# Patient Record
Sex: Female | Born: 1980 | Race: Black or African American | Hispanic: No | Marital: Married
Health system: Southern US, Community
[De-identification: ages and names within clinical notes are randomized; demographics above are authoritative.]

## PROBLEM LIST (undated history)

## (undated) DIAGNOSIS — I10 Essential (primary) hypertension: Secondary | ICD-10-CM

## (undated) DIAGNOSIS — F1010 Alcohol abuse, uncomplicated: Secondary | ICD-10-CM

## (undated) DIAGNOSIS — F419 Anxiety disorder, unspecified: Secondary | ICD-10-CM

## (undated) DIAGNOSIS — K852 Alcohol induced acute pancreatitis without necrosis or infection: Secondary | ICD-10-CM

## (undated) DIAGNOSIS — K769 Liver disease, unspecified: Secondary | ICD-10-CM

## (undated) DIAGNOSIS — F10931 Alcohol use, unspecified with withdrawal delirium: Secondary | ICD-10-CM

## (undated) DIAGNOSIS — G9340 Encephalopathy, unspecified: Secondary | ICD-10-CM

## (undated) HISTORY — DX: Anxiety disorder, unspecified: F41.9

## (undated) HISTORY — DX: Essential (primary) hypertension: I10

## (undated) HISTORY — PX: ECTOPIC PREGNANCY SURGERY: SHX613

---

## 2002-08-07 ENCOUNTER — Inpatient Hospital Stay (HOSPITAL_COMMUNITY): Admission: AD | Admit: 2002-08-07 | Discharge: 2002-08-07 | Payer: Self-pay | Admitting: Obstetrics and Gynecology

## 2002-09-21 ENCOUNTER — Inpatient Hospital Stay (HOSPITAL_COMMUNITY): Admission: AD | Admit: 2002-09-21 | Discharge: 2002-09-21 | Payer: Self-pay | Admitting: *Deleted

## 2002-10-16 ENCOUNTER — Inpatient Hospital Stay (HOSPITAL_COMMUNITY): Admission: AD | Admit: 2002-10-16 | Discharge: 2002-10-16 | Payer: Self-pay | Admitting: Family Medicine

## 2002-10-22 ENCOUNTER — Inpatient Hospital Stay (HOSPITAL_COMMUNITY): Admission: AD | Admit: 2002-10-22 | Discharge: 2002-10-22 | Payer: Self-pay | Admitting: *Deleted

## 2002-11-07 ENCOUNTER — Inpatient Hospital Stay (HOSPITAL_COMMUNITY): Admission: AD | Admit: 2002-11-07 | Discharge: 2002-11-07 | Payer: Self-pay | Admitting: *Deleted

## 2002-11-30 ENCOUNTER — Observation Stay (HOSPITAL_COMMUNITY): Admission: AD | Admit: 2002-11-30 | Discharge: 2002-12-01 | Payer: Self-pay | Admitting: Obstetrics and Gynecology

## 2003-01-25 ENCOUNTER — Ambulatory Visit (HOSPITAL_COMMUNITY): Admission: RE | Admit: 2003-01-25 | Discharge: 2003-01-25 | Payer: Self-pay | Admitting: *Deleted

## 2003-01-28 ENCOUNTER — Inpatient Hospital Stay (HOSPITAL_COMMUNITY): Admission: AD | Admit: 2003-01-28 | Discharge: 2003-01-28 | Payer: Self-pay | Admitting: *Deleted

## 2017-04-29 DIAGNOSIS — R748 Abnormal levels of other serum enzymes: Secondary | ICD-10-CM | POA: Insufficient documentation

## 2017-09-13 ENCOUNTER — Encounter (HOSPITAL_COMMUNITY): Payer: Self-pay

## 2017-09-13 ENCOUNTER — Emergency Department (HOSPITAL_COMMUNITY)
Admission: EM | Admit: 2017-09-13 | Discharge: 2017-09-15 | Disposition: A | Discharge status: Home / Self Care | Payer: Self-pay | Attending: Emergency Medicine | Admitting: Emergency Medicine

## 2017-09-13 DIAGNOSIS — F10929 Alcohol use, unspecified with intoxication, unspecified: Secondary | ICD-10-CM

## 2017-09-13 DIAGNOSIS — F10129 Alcohol abuse with intoxication, unspecified: Principal | ICD-10-CM

## 2017-09-13 DIAGNOSIS — Y908 Blood alcohol level of 240 mg/100 ml or more: Secondary | ICD-10-CM

## 2017-09-13 DIAGNOSIS — F1994 Other psychoactive substance use, unspecified with psychoactive substance-induced mood disorder: Secondary | ICD-10-CM

## 2017-09-13 DIAGNOSIS — R45851 Suicidal ideations: Secondary | ICD-10-CM

## 2017-09-13 DIAGNOSIS — F1014 Alcohol abuse with alcohol-induced mood disorder: Secondary | ICD-10-CM

## 2017-09-13 LAB — COMPREHENSIVE METABOLIC PANEL
ALBUMIN: 3.6 g/dL (ref 3.5–5.0)
ALK PHOS: 148 U/L — AB (ref 38–126)
ALT: 75 U/L — ABNORMAL HIGH (ref 14–54)
AST: 453 U/L — AB (ref 15–41)
Anion gap: 10 (ref 5–15)
BILIRUBIN TOTAL: 0.6 mg/dL (ref 0.3–1.2)
BUN: 10 mg/dL (ref 6–20)
CALCIUM: 8.1 mg/dL — AB (ref 8.9–10.3)
CO2: 23 mmol/L (ref 22–32)
Chloride: 105 mmol/L (ref 101–111)
Creatinine, Ser: 0.51 mg/dL (ref 0.44–1.00)
GFR calc Af Amer: >60 mL/min (ref >60)
GLUCOSE: 113 mg/dL — AB (ref 65–99)
POTASSIUM: 3 mmol/L — AB (ref 3.5–5.1)
Sodium: 138 mmol/L (ref 135–145)
TOTAL PROTEIN: 7.4 g/dL (ref 6.5–8.1)

## 2017-09-13 LAB — CBC
HEMATOCRIT: 32.8 % — AB (ref 36.0–46.0)
Hemoglobin: 11 g/dL — ABNORMAL LOW (ref 12.0–15.0)
MCH: 33.5 pg (ref 26.0–34.0)
MCHC: 33.5 g/dL (ref 30.0–36.0)
MCV: 100 fL (ref 78.0–100.0)
PLATELETS: 170 10*3/uL (ref 150–400)
RBC: 3.28 MIL/uL — ABNORMAL LOW (ref 3.87–5.11)
RDW: 15.7 % — AB (ref 11.5–15.5)
WBC: 3 10*3/uL — AB (ref 4.0–10.5)

## 2017-09-13 LAB — ACETAMINOPHEN LEVEL: Acetaminophen (Tylenol), Serum: <10 ug/mL — ABNORMAL LOW (ref 10–30)

## 2017-09-13 LAB — RAPID URINE DRUG SCREEN, HOSP PERFORMED
Amphetamines: NOT DETECTED
BARBITURATES: NOT DETECTED
BENZODIAZEPINES: POSITIVE — AB
Cocaine: NOT DETECTED
Opiates: NOT DETECTED
Tetrahydrocannabinol: NOT DETECTED

## 2017-09-13 LAB — ETHANOL: ALCOHOL ETHYL (B): 508 mg/dL — AB (ref <10)

## 2017-09-13 LAB — CK: Total CK: 262 U/L — ABNORMAL HIGH (ref 38–234)

## 2017-09-13 LAB — I-STAT CG4 LACTIC ACID, ED: LACTIC ACID, VENOUS: 2.38 mmol/L — AB (ref 0.5–1.9)

## 2017-09-13 LAB — SALICYLATE LEVEL: Salicylate Lvl: <7 mg/dL (ref 2.8–30.0)

## 2017-09-13 LAB — I-STAT BETA HCG BLOOD, ED (MC, WL, AP ONLY)

## 2017-09-13 MED ORDER — SODIUM CHLORIDE 0.9 % IV BOLUS (SEPSIS)
500.0000 mL | Freq: Once | INTRAVENOUS | Status: AC
  Administered 2017-09-13: 500 mL via INTRAVENOUS

## 2017-09-13 MED ORDER — SODIUM CHLORIDE 0.9 % IV BOLUS (SEPSIS)
1000.0000 mL | Freq: Once | INTRAVENOUS | Status: AC
  Administered 2017-09-13: 1000 mL via INTRAVENOUS

## 2017-09-13 NOTE — ED Triage Notes (Signed)
Pt arrived from home via [facility] with reports of aggressive behaviors and alcohol intoxication. Pt was given 82m versed en route with no change. Pt has arrived screaming, cursing and attempting to bite EMS.

## 2017-09-13 NOTE — ED Notes (Signed)
Bed: IO03 Expected date:  Expected time:  Means of arrival:  Comments: EMS 37 yo female alcohol abuse combative versed 5 mg/restrained

## 2017-09-14 DIAGNOSIS — F1014 Alcohol abuse with alcohol-induced mood disorder: Secondary | ICD-10-CM

## 2017-09-14 LAB — I-STAT CG4 LACTIC ACID, ED: Lactic Acid, Venous: 2.06 mmol/L (ref 0.5–1.9)

## 2017-09-14 MED ORDER — POTASSIUM CHLORIDE 10 MEQ/100ML IV SOLN
10.0000 meq | Freq: Once | INTRAVENOUS | Status: AC
  Administered 2017-09-14: 10 meq via INTRAVENOUS
  Filled 2017-09-14: qty 100

## 2017-09-14 MED ORDER — TRAZODONE HCL 50 MG PO TABS
50.0000 mg | ORAL_TABLET | Freq: Every evening | ORAL | Status: DC | PRN
  Administered 2017-09-14: 50 mg via ORAL
  Filled 2017-09-14: qty 1

## 2017-09-14 MED ORDER — ACETAMINOPHEN 325 MG PO TABS: 650.0000 mg | ORAL_TABLET | ORAL | Status: DC | PRN

## 2017-09-14 MED ORDER — GABAPENTIN 300 MG PO CAPS
300.0000 mg | ORAL_CAPSULE | Freq: Three times a day (TID) | ORAL | Status: DC
  Administered 2017-09-14 (×3): 300 mg via ORAL
  Filled 2017-09-14 (×3): qty 1

## 2017-09-14 MED ORDER — ONDANSETRON HCL 4 MG PO TABS: 4.0000 mg | ORAL_TABLET | Freq: Three times a day (TID) | ORAL | Status: DC | PRN

## 2017-09-14 MED ORDER — ALUM & MAG HYDROXIDE-SIMETH 200-200-20 MG/5ML PO SUSP: 30.0000 mL | Freq: Four times a day (QID) | ORAL | Status: DC | PRN

## 2017-09-14 MED ORDER — POTASSIUM CHLORIDE CRYS ER 20 MEQ PO TBCR: 40.0000 meq | EXTENDED_RELEASE_TABLET | Freq: Once | ORAL | Status: DC

## 2017-09-14 NOTE — ED Notes (Signed)
Pt has started to become more awake, started yelling and cursing about why she is in the hospital. When trying to explain the patient became irate and saying "that's not true to get her husband on the f*ing phone right now."

## 2017-09-14 NOTE — ED Notes (Signed)
Hourly rounding reveals patient sleeping in room. No complaints, stable, in no acute distress. Q15 minute rounds and monitoring via Security Cameras to continue. 

## 2017-09-14 NOTE — Patient Outreach (Signed)
CPSS met with patient and her husband. The husband of the patient informed CPSS that he was taking her for an assessment at [facility] for substance use treatment tomorrow morning. CPSS provided resource list for patient in case she does not get into [facility]. CPSS also provided CPSS contact information. CPSS encouraged husband and patient to contact CPSS at any time for help with substance use resources.

## 2017-09-14 NOTE — ED Notes (Signed)
Pt is pleasant.  She states she intends to go to [facility] in the morning.  Pt has much family support.  Pt is showing symptoms of shakes.  She has been medicated as per order.

## 2017-09-14 NOTE — ED Notes (Signed)
Writer notified EDP Roxanne Mins of abnormal I stat lactic result

## 2017-09-14 NOTE — ED Notes (Signed)
Report to include Situation, Background, Assessment, and Recommendations received from [facility]. Patient alert and oriented, warm and dry, in no acute distress. Patient denies SI, HI, AVH and pain. Patient made aware of Q15 minute rounds and security cameras for their safety. Patient instructed to come to me with needs or concerns.

## 2017-09-14 NOTE — ED Notes (Signed)
Hourly rounding reveals patient in room. Complains of insomnia, stable, in no acute distress. Q15 minute rounds and monitoring via [employer] to continue.

## 2017-09-14 NOTE — ED Provider Notes (Signed)
[facility] DEPT Provider Note   CSN: [id number] Arrival date & time: 09/13/17  2028     History   Chief Complaint Chief Complaint  Patient presents with  . Aggressive Behavior  . Alcohol Intoxication    HPI Joann [name] is a 37 y.o. female.  37 year old female with unknown past medical history presents with alcohol intoxication and aggressive behavior.  EMS was called to her home by her husband who reported aggressive behavior and stated that she had been drinking tonight.  EMS found her surrounded by liquor bottles and agitated.  She received 21m IM versed due to agitation in transport. Blood glucose normal. She has threatened to hurt others stating "I will kill you bitch" and she has also threatened to kill herself. She asked police officers repeatedly to kill her.   LEVEL 5 CAVEAT DUE TO AMS   The history is provided by the EMS personnel.    History reviewed. No pertinent past medical history.  There are no active problems to display for this patient.     OB History    No data available       Home Medications    Prior to Admission medications   Not on File    Family History No family history on file.  Unable to obtain 2/2 AMS  Social History Social History   Tobacco Use  . Smoking status: Not on file  Substance Use Topics  . Alcohol use: Not on file  . Drug use: Not on file   + alcohol use Unable to obtain 2/2 AMS  Allergies   Patient has no allergy information on record. Unable to obtain 2/2 AMS  Review of Systems Review of Systems Unable to obtain 2/2 AMS  Physical Exam Updated Vital Signs LMP 09/13/2017   Physical Exam  Constitutional: She appears well-developed and well-nourished. She appears distressed.  Yelling, combative  HENT:  Head: Normocephalic and atraumatic.  Eyes: Conjunctivae are normal. Pupils are equal, round, and reactive to light.  Neck: Neck supple.  Cardiovascular: Regular rhythm and  normal heart sounds. Tachycardia present.  No murmur heard. Pulmonary/Chest: Effort normal and breath sounds normal.  Abdominal: Soft. Bowel sounds are normal. She exhibits no distension. There is no tenderness.  Musculoskeletal: She exhibits no edema.  Neurological: She is alert.  Slurred speech, moving all 4 ext equally  Skin: Skin is warm. She is diaphoretic.  Psychiatric: Her affect is angry and labile. Her speech is slurred. She is agitated, aggressive and combative.  Nursing note and vitals reviewed.    ED Treatments / Results  Labs (all labs ordered are listed, but only abnormal results are displayed) Labs Reviewed  COMPREHENSIVE METABOLIC PANEL - Abnormal; Notable for the following components:      Result Value   Potassium 3.0 (*)    Glucose, Bld 113 (*)    Calcium 8.1 (*)    AST 453 (*)    ALT 75 (*)    Alkaline Phosphatase 148 (*)    All other components within normal limits  ETHANOL - Abnormal; Notable for the following components:   Alcohol, Ethyl (B) 508 (*)    All other components within normal limits  ACETAMINOPHEN LEVEL - Abnormal; Notable for the following components:   Acetaminophen (Tylenol), Serum <10 (*)    All other components within normal limits  CBC - Abnormal; Notable for the following components:   WBC 3.0 (*)    RBC 3.28 (*)    Hemoglobin 11.0 (*)  HCT 32.8 (*)    RDW 15.7 (*)    All other components within normal limits  RAPID URINE DRUG SCREEN, HOSP PERFORMED - Abnormal; Notable for the following components:   Benzodiazepines POSITIVE (*)    All other components within normal limits  CK - Abnormal; Notable for the following components:   Total CK 262 (*)    All other components within normal limits  I-STAT CG4 LACTIC ACID, ED - Abnormal; Notable for the following components:   Lactic Acid, Venous 2.38 (*)    All other components within normal limits  SALICYLATE LEVEL  I-STAT BETA HCG BLOOD, ED ([facility], [facility], [facility] ONLY)  I-STAT CG4 LACTIC ACID,  ED    EKG  EKG Interpretation None       Radiology No results found.  Procedures Procedures (including critical care time)  Medications Ordered in ED Medications  potassium chloride 10 mEq in 100 mL IVPB (not administered)  potassium chloride SA (K-DUR,KLOR-CON) CR tablet 40 mEq (not administered)  sodium chloride 0.9 % bolus 1,000 mL (1,000 mLs Intravenous New Bag/Given 09/13/17 2228)  sodium chloride 0.9 % bolus 500 mL (500 mLs Intravenous New Bag/Given 09/13/17 2228)     Initial Impression / Assessment and Plan / ED Course  I have reviewed the triage vital signs and the nursing notes.  Pertinent labs that were available during my care of the patient were reviewed by me and considered in my medical decision making (see chart for details).    PT combative, agitated, requiring police restraint on arrival.  Immediately gave IM Haldol and placed in soft restraints due to concern for staff and patient's safety.  Completed IVC papers because of her threats to hurt others as well as her threats to kill herself.  Lactate 2.38, potassium 3, blood alcohol 508, CK 262.  Gave IV fluids and potassium repletion.  We will allow the patient to metabolize and when she is more sober we will contact TTS for evaluation.  I am signing out to the oncoming provider Dr. Roxanne Mins. Dispo pending sobriety and reassessment.  Final Clinical Impressions(s) / ED Diagnoses   Final diagnoses:  None    ED Discharge Orders    None       Camella Seim, Wenda Overland, MD 09/14/17 [phone]

## 2017-09-14 NOTE — ED Notes (Signed)
Hourly rounding reveals patient in room. No complaints, stable, in no acute distress. Q15 minute rounds and monitoring via Security Cam[employer] to continue. 

## 2017-09-14 NOTE — ED Notes (Signed)
Pt sleeping.  Easily arousable .  Pt denies S/I and H/I.  Pt uninterested in assessment.  15 minute checks and video monitoring in place.  Pt shows no signs of withdrawal.

## 2017-09-14 NOTE — Progress Notes (Signed)
CSW faxed IVC paperwork to the [facility]. CSW called [employer]. Magistrate confirmed that they received the fax.   Wendelyn Breslow, [facility]  [phone]

## 2017-09-14 NOTE — ED Provider Notes (Signed)
Patient signed out to me with alcohol intoxication, waiting for level to come down to point where she can participate in TTS evaluation.  She has been resting comfortably throughout the night and shows no tremulousness.   Delora Fuel, MD 19/94/12 [phone]

## 2017-09-14 NOTE — Progress Notes (Signed)
Patient ID: Joann [name], female   DOB: [birth year], 37 y.o.   MRN: [id number]  Per Staff RN; Pt's husband informed that Pt has a treatment bed at [facility] but needs to leave early in the AM, at least by 0930-1000 in order to secure the bed.    Ethelene Hal, FNP-BC 09-14-2017       [phone]
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
## 2017-09-15 DIAGNOSIS — F1014 Alcohol abuse with alcohol-induced mood disorder: Secondary | ICD-10-CM

## 2017-09-15 DIAGNOSIS — R45850 Homicidal ideations: Secondary | ICD-10-CM

## 2017-09-15 DIAGNOSIS — Y908 Blood alcohol level of 240 mg/100 ml or more: Secondary | ICD-10-CM

## 2017-09-15 NOTE — ED Notes (Signed)
Patient is A & O.  Understood AVS. Discharged with husband and going to [employer] [facility]

## 2017-09-15 NOTE — ED Notes (Signed)
Hourly rounding reveals patient sleeping in room. No complaints, stable, in no acute distress. Q15 minute rounds and monitoring via Security Cameras to continue. 

## 2017-09-15 NOTE — BHH Suicide Risk Assessment (Signed)
Suicide Risk Assessment  Discharge Assessment   [facility] Discharge Suicide Risk Assessment   Principal Problem: Alcohol abuse with alcohol-induced mood disorder [facility]) Discharge Diagnoses:  Patient Active Problem List   Diagnosis Date Noted  . Alcohol abuse with alcohol-induced mood disorder ([place]) [F10.14] 09/14/2017    Total Time spent with patient: 45 minutes  Musculoskeletal: Strength & Muscle Tone: within normal limits Gait & Station: normal Patient leans: N/A  Psychiatric Specialty Exam: Physical Exam  Constitutional: She is oriented to person, place, and time. She appears well-developed and well-nourished.  HENT:  Head: Normocephalic.  Respiratory: Effort normal.  Musculoskeletal: Normal range of motion.  Neurological: She is alert and oriented to person, place, and time.  Psychiatric: Her speech is normal and behavior is normal. Thought content normal. Her mood appears anxious. Cognition and memory are normal. She expresses impulsivity.   Review of Systems  Psychiatric/Behavioral: Positive for substance abuse. Negative for depression, hallucinations, memory loss and suicidal ideas. The patient is not nervous/anxious and does not have insomnia.   All other systems reviewed and are negative.  Blood pressure 139/86, pulse 89, temperature 98.4 F (36.9 C), temperature source Oral, resp. rate 18, last menstrual period 09/13/2017, SpO2 100 %.There is no height or weight on file to calculate BMI. General Appearance: Casual Eye Contact:  Good Speech:  Clear and Coherent and Normal Rate Volume:  Normal Mood:  Depressed Affect:  Congruent Thought Process:  Coherent, Goal Directed and Linear Orientation:  Full (Time, Place, and Person) Thought Content:  Logical Suicidal Thoughts:  No Homicidal Thoughts:  No Memory:  Immediate;   Good Recent;   Good Remote;   Fair Judgement:  Fair Insight:  Fair Psychomotor Activity:  Normal Concentration:  Concentration: Good and Attention  Span: Good Recall:  Good Fund of Knowledge:  Good Language:  Good Akathisia:  No Handed:  Right AIMS (if indicated):    Assets:  Communication Skills Desire for Improvement Housing Social Support ADL's:  Intact Cognition:  WNL   Mental Status Per Nursing Assessment::   On Admission:   Homicidal ideation while intoxicated  Demographic Factors:  Low socioeconomic status and Unemployed  Loss Factors: Financial problems/change in socioeconomic status  Historical Factors: Impulsivity  Risk Reduction Factors:   Sense of responsibility to family and Living with another person, especially a relative  Continued Clinical Symptoms:  Depression:   Impulsivity Alcohol/Substance Abuse/Dependencies  Cognitive Features That Contribute To Risk:  Closed-mindedness    Suicide Risk:  Minimal: No identifiable suicidal ideation.  Patients presenting with no risk factors but with morbid ruminations; may be classified as minimal risk based on the severity of the depressive symptoms    Plan Of Care/Follow-up recommendations:  Activity:  as tolerated Diet:  Heart Healthy  Ethelene Hal, NP 09/15/2017, 1:11 PM

## 2017-09-15 NOTE — Progress Notes (Signed)
Per tx team, pt is being D/C'd.  Psychiatrist signed papers rescinding pt's IVC paperwork, CSW faxed in to the magistrate and confirmed paperwork was received.  Please reconsult if future social work needs arise.  CSW signing off, as social work intervention is no longer needed.  Alphonse Guild. Alexsa Flaum, LCSW, LCAS, CSI Clinical Social Worker Ph: [phone]

## 2017-09-15 NOTE — Consult Note (Signed)
[facility] Consult   Reason for Consult:  Homicidal ideation while intoxicated Referring Physician:  EDP Patient Identification: Joann [name] MRN:  [id number] Principal Diagnosis: Alcohol abuse with alcohol-induced mood disorder [facility]) Diagnosis:   Patient Active Problem List   Diagnosis Date Noted  . Alcohol abuse with alcohol-induced mood disorder ([place]) [F10.14] 09/14/2017    Total Time spent with patient: 45 minutes  Subjective:   Joann [name] is a 37 y.o. female patient admitted with homicidal ideation while intoxicated.  HPI:  Pt was seen and chart reviewed with treatment team and Dr Darleene Cleaver. Pt stated she knows she needs help for her drinking problem and is ready to participate. Pt has a bed at [facility] and can be admitted there today. Pt's UDS negative, BAL 508 on admission. Pt was seen by Peer Support and has substance abuse treatment resources in the community.  Pt denies suicidal/homicidal ideation, denies auditory/visual hallucinations and does not appear to be responding to internal stimuli. Pt is stable and psychiatrically clear for discharge.   Past Psychiatric History: As above  Risk to Self: None Risk to Others: None Prior Inpatient Therapy: Prior Inpatient Therapy: (UTA) Prior Therapy Dates: UTA Prior Therapy Facilty/Provider(s): UTA Reason for Treatment: UTA Prior Outpatient Therapy: Prior Outpatient Therapy: (UTA) Prior Therapy Dates: UTA Prior Therapy Facilty/Provider(s): UTA Reason for Treatment: UTA Does patient have an ACCT team?: Unknown Does patient have Intensive In-House Services?  : Unknown Does patient have [facility] services? : Unknown Does patient have P4CC services?: Unknown  Past Medical History: History reviewed. No pertinent past medical history. Family History: No family history on file. Family Psychiatric  History: Unknown Social History:  Social History   Substance and Sexual Activity  Alcohol Use Not on file     Social  History   Substance and Sexual Activity  Drug Use Not on file    Social History   Socioeconomic History  . Marital status: Married    Spouse name: None  . Number of children: None  . Years of education: None  . Highest education level: None  Social Needs  . Financial resource strain: None  . Food insecurity - worry: None  . Food insecurity - inability: None  . Transportation needs - medical: None  . Transportation needs - non-medical: None  Occupational History  . None  Tobacco Use  . Smoking status: None  Substance and Sexual Activity  . Alcohol use: None  . Drug use: None  . Sexual activity: None  Other Topics Concern  . None  Social History Narrative  . None   Additional Social History:    Allergies:  No Known Allergies  Labs:  Results for orders placed or performed during the hospital encounter of 09/13/17 (from the past 48 hour(s))  Rapid urine drug screen (hospital performed)     Status: Abnormal   Collection Time: 09/13/17  8:55 PM  Result Value Ref Range   Opiates NONE DETECTED NONE DETECTED   Cocaine NONE DETECTED NONE DETECTED   Benzodiazepines POSITIVE (A) NONE DETECTED   Amphetamines NONE DETECTED NONE DETECTED   Tetrahydrocannabinol NONE DETECTED NONE DETECTED   Barbiturates NONE DETECTED NONE DETECTED    Comment: (NOTE) DRUG SCREEN FOR MEDICAL PURPOSES ONLY.  IF CONFIRMATION IS NEEDED FOR ANY PURPOSE, NOTIFY LAB WITHIN 5 DAYS. LOWEST DETECTABLE LIMITS FOR URINE DRUG SCREEN Drug Class                     Cutoff (ng/mL) Amphetamine and  metabolites    1000 Barbiturate and metabolites    200 Benzodiazepine                 160 Tricyclics and metabolites     300 Opiates and metabolites        300 Cocaine and metabolites        300 THC                            50   Comprehensive metabolic panel     Status: Abnormal   Collection Time: 09/13/17  8:57 PM  Result Value Ref Range   Sodium 138 135 - 145 mmol/L   Potassium 3.0 (L) 3.5 - 5.1 mmol/L    Chloride 105 101 - 111 mmol/L   CO2 23 22 - 32 mmol/L   Glucose, Bld 113 (H) 65 - 99 mg/dL   BUN 10 6 - 20 mg/dL   Creatinine, Ser 0.51 0.44 - 1.00 mg/dL   Calcium 8.1 (L) 8.9 - 10.3 mg/dL   Total Protein 7.4 6.5 - 8.1 g/dL   Albumin 3.6 3.5 - 5.0 g/dL   AST 453 (H) 15 - 41 U/L   ALT 75 (H) 14 - 54 U/L   Alkaline Phosphatase 148 (H) 38 - 126 U/L   Total Bilirubin 0.6 0.3 - 1.2 mg/dL   GFR calc non Af Amer >60 >60 mL/min   GFR calc Af Amer >60 >60 mL/min    Comment: (NOTE) The eGFR has been calculated using the CKD EPI equation. This calculation has not been validated in all clinical situations. eGFR's persistently <60 mL/min signify possible Chronic Kidney Disease.    Anion gap 10 5 - 15  Ethanol     Status: Abnormal   Collection Time: 09/13/17  8:57 PM  Result Value Ref Range   Alcohol, Ethyl (B) 508 (HH) <10 mg/dL    Comment:        LOWEST DETECTABLE LIMIT FOR SERUM ALCOHOL IS 10 mg/dL FOR MEDICAL PURPOSES ONLY CRITICAL RESULT CALLED TO, READ BACK BY AND VERIFIED WITH: TEUP,N. RN @2146  ON 10.93.23 BY COHEN,K   Salicylate level     Status: None   Collection Time: 09/13/17  8:57 PM  Result Value Ref Range   Salicylate Lvl <5.5 2.8 - 30.0 mg/dL  Acetaminophen level     Status: Abnormal   Collection Time: 09/13/17  8:57 PM  Result Value Ref Range   Acetaminophen (Tylenol), Serum <10 (L) 10 - 30 ug/mL    Comment:        THERAPEUTIC CONCENTRATIONS VARY SIGNIFICANTLY. A RANGE OF 10-30 ug/mL MAY BE AN EFFECTIVE CONCENTRATION FOR MANY PATIENTS. HOWEVER, SOME ARE BEST TREATED AT CONCENTRATIONS OUTSIDE THIS RANGE. ACETAMINOPHEN CONCENTRATIONS >150 ug/mL AT 4 HOURS AFTER INGESTION AND >50 ug/mL AT 12 HOURS AFTER INGESTION ARE OFTEN ASSOCIATED WITH TOXIC REACTIONS.   cbc     Status: Abnormal   Collection Time: 09/13/17  8:57 PM  Result Value Ref Range   WBC 3.0 (L) 4.0 - 10.5 K/uL   RBC 3.28 (L) 3.87 - 5.11 MIL/uL   Hemoglobin 11.0 (L) 12.0 - 15.0 g/dL   HCT 32.8  (L) 36.0 - 46.0 %   MCV 100.0 78.0 - 100.0 fL   MCH 33.5 26.0 - 34.0 pg   MCHC 33.5 30.0 - 36.0 g/dL   RDW 15.7 (H) 11.5 - 15.5 %   Platelets 170 150 - 400 K/uL  CK  Status: Abnormal   Collection Time: 09/13/17  8:57 PM  Result Value Ref Range   Total CK 262 (H) 38 - 234 U/L  I-Stat beta hCG blood, ED     Status: None   Collection Time: 09/13/17  9:08 PM  Result Value Ref Range   I-stat hCG, quantitative <5.0 <5 mIU/mL   Comment 3            Comment:   GEST. AGE      CONC.  (mIU/mL)   <=1 WEEK        5 - 50     2 WEEKS       50 - 500     3 WEEKS       100 - 10,000     4 WEEKS     1,000 - 30,000        FEMALE AND NON-PREGNANT FEMALE:     LESS THAN 5 mIU/mL   I-Stat CG4 Lactic Acid, ED     Status: Abnormal   Collection Time: 09/13/17  9:09 PM  Result Value Ref Range   Lactic Acid, Venous 2.38 (HH) 0.5 - 1.9 mmol/L   Comment NOTIFIED PHYSICIAN   I-Stat CG4 Lactic Acid, ED     Status: Abnormal   Collection Time: 09/14/17  1:24 AM  Result Value Ref Range   Lactic Acid, Venous 2.06 (HH) 0.5 - 1.9 mmol/L   Comment NOTIFIED PHYSICIAN     No current facility-administered medications for this encounter.    No current outpatient medications on file.    Musculoskeletal: Strength & Muscle Tone: within normal limits Gait & Station: normal Patient leans: N/A  Psychiatric Specialty Exam: Physical Exam  Constitutional: She is oriented to person, place, and time. She appears well-developed and well-nourished.  HENT:  Head: Normocephalic.  Respiratory: Effort normal.  Musculoskeletal: Normal range of motion.  Neurological: She is alert and oriented to person, place, and time.  Psychiatric: Her speech is normal and behavior is normal. Thought content normal. Her mood appears anxious. Cognition and memory are normal. She expresses impulsivity.    Review of Systems  Psychiatric/Behavioral: Positive for substance abuse. Negative for depression, hallucinations, memory loss and  suicidal ideas. The patient is not nervous/anxious and does not have insomnia.   All other systems reviewed and are negative.   Blood pressure 139/86, pulse 89, temperature 98.4 F (36.9 C), temperature source Oral, resp. rate 18, last menstrual period 09/13/2017, SpO2 100 %.There is no height or weight on file to calculate BMI.  General Appearance: Casual  Eye Contact:  Good  Speech:  Clear and Coherent and Normal Rate  Volume:  Normal  Mood:  Depressed  Affect:  Congruent  Thought Process:  Coherent, Goal Directed and Linear  Orientation:  Full (Time, Place, and Person)  Thought Content:  Logical  Suicidal Thoughts:  No  Homicidal Thoughts:  No  Memory:  Immediate;   Good Recent;   Good Remote;   Fair  Judgement:  Fair  Insight:  Fair  Psychomotor Activity:  Normal  Concentration:  Concentration: Good and Attention Span: Good  Recall:  Good  Fund of Knowledge:  Good  Language:  Good  Akathisia:  No  Handed:  Right  AIMS (if indicated):     Assets:  Communication Skills Desire for Improvement Housing Social Support  ADL's:  Intact  Cognition:  WNL  Sleep:        Treatment Plan Summary: Plan Alcohol abuse with alcohol-induced mood disorder ([place])  Discharge Home, Husband to transport to [facility] in [place] all medications as prescribed Avoid the use of alcohol and illicit drugs  Disposition: No evidence of imminent risk to self or others at present.   Patient does not meet criteria for psychiatric inpatient admission. Supportive therapy provided about ongoing stressors. Discussed crisis plan, support from social network, calling 911, coming to the Emergency Department, and calling Suicide Hotline.  Ethelene Hal, NP 09/15/2017 12:58 PM  Patient seen face-to-face for psychiatric evaluation, chart reviewed and case discussed with the physician extender and developed treatment plan. Reviewed the information documented and agree with the treatment  plan. Corena Pilgrim, MD

## 2017-09-15 NOTE — ED Notes (Signed)
Patient is A & O and is waiting for discharge.  Husband at bedside.

## 2017-10-04 ENCOUNTER — Emergency Department (HOSPITAL_COMMUNITY): Admission: EM | Admit: 2017-10-04 | Discharge: 2017-10-04 | Discharge status: Absent without leave | Payer: Self-pay

## 2018-02-08 ENCOUNTER — Other Ambulatory Visit: Payer: Self-pay

## 2018-02-08 ENCOUNTER — Encounter (HOSPITAL_COMMUNITY): Payer: Self-pay | Admitting: Emergency Medicine

## 2018-02-08 ENCOUNTER — Emergency Department (HOSPITAL_COMMUNITY)
Admission: EM | Admit: 2018-02-08 | Discharge: 2018-02-09 | Disposition: A | Discharge status: Home / Self Care | Payer: Self-pay | Attending: Emergency Medicine | Admitting: Emergency Medicine

## 2018-02-08 DIAGNOSIS — Z5321 Procedure and treatment not carried out due to patient leaving prior to being seen by health care provider: Secondary | ICD-10-CM

## 2018-02-08 DIAGNOSIS — R079 Chest pain, unspecified: Principal | ICD-10-CM

## 2018-02-08 LAB — BASIC METABOLIC PANEL
Anion gap: 13 (ref 5–15)
BUN: 14 mg/dL (ref 6–20)
CHLORIDE: 105 mmol/L (ref 101–111)
CO2: 22 mmol/L (ref 22–32)
CREATININE: 0.6 mg/dL (ref 0.44–1.00)
Calcium: 8.7 mg/dL — ABNORMAL LOW (ref 8.9–10.3)
GFR calc non Af Amer: >60 mL/min (ref >60)
Glucose, Bld: 109 mg/dL — ABNORMAL HIGH (ref 65–99)
Potassium: 3.6 mmol/L (ref 3.5–5.1)
Sodium: 140 mmol/L (ref 135–145)

## 2018-02-08 LAB — CBC
HEMATOCRIT: 34 % — AB (ref 36.0–46.0)
Hemoglobin: 11 g/dL — ABNORMAL LOW (ref 12.0–15.0)
MCH: 30.4 pg (ref 26.0–34.0)
MCHC: 32.4 g/dL (ref 30.0–36.0)
MCV: 93.9 fL (ref 78.0–100.0)
PLATELETS: 144 10*3/uL — AB (ref 150–400)
RBC: 3.62 MIL/uL — AB (ref 3.87–5.11)
RDW: 16 % — ABNORMAL HIGH (ref 11.5–15.5)
WBC: 4.8 10*3/uL (ref 4.0–10.5)

## 2018-02-08 LAB — I-STAT BETA HCG BLOOD, ED (MC, WL, AP ONLY): I-stat hCG, quantitative: <5 m[IU]/mL (ref <5)

## 2018-02-08 LAB — I-STAT TROPONIN, ED: Troponin i, poc: 0 ng/mL (ref 0.00–0.08)

## 2018-02-08 MED ORDER — LORAZEPAM 2 MG/ML IJ SOLN: 1.0000 mg | Freq: Once | INTRAMUSCULAR | Status: DC

## 2018-02-08 NOTE — ED Notes (Signed)
Pt and family member arguing.  Pt walks out.

## 2018-02-08 NOTE — ED Provider Notes (Deleted)
11:43 PM I was asked to come see pt. Pt initially checked in for chest pain which started at home. Pt with hx of alcohol abuse, has been trying to "wean" herself form drinking. States on ativan at home, but did have few drinks around 2 pm.  States this evening started having chest pressure in the left side of the chest which is now resolved.  Patient was triaged, had blood work drawn, and then she stated to the staff that she wanted to leave and I was asked to come and see patient.   Patient appears to be agitated.  She is yelling at her husband who is also in the room.  She is requesting to have her IV out which was just put in.  She states that her chest pain is better and she would like to go home.  On initial nursing exam, patient is suspected to be in alcohol withdrawal, her CIWA score 16.  I asked patient to stay and that we will give her some Ativan to calm her down.  However patient is adamant at this time that she is leaving.  She understands the risk of leaving with abnormal vital signs and this episode of chest pain.  Although she states that it is now gone, there is still concern about possible and potentially life-threatening event.  Patient's husband trying to talk her into staying, however patient is adamant about getting her IV out.  At this time, patient is alert and oriented, I think she is capable of making own decisions even though she is slightly agitated.  She appears to be clinically sober.  She is ambulatory.  She stated that she will come back of her symptoms are worsening.  She will be discharged AGAINST MEDICAL ADVICE  Physical exam: Aggitated. AAOx3. Lungs clear. Tachycardic otherwise normal VS. No peripheral edema.   Vitals:   02/08/18 2314  BP: 129/76  Pulse: (!) 112  Resp: 18  Temp: 98.5 F (36.9 C)  TempSrc: Oral  SpO2: 98%      Jeannett Senior, PA-C 02/08/18 2348

## 2018-02-08 NOTE — ED Triage Notes (Addendum)
Pt reports sudden onset non radiating centralized chest pain that started around 9pm.  Pt reports she is experiencing withdrawals from alcohol, she has been trying to "wean myself off."  Pt last drink was 1 beer and 2 shots around 1pm.  Pt has hx of withdrawal seizures.  Pt has been uncooperative and does not want to be here.  Family is supportive in triage.  Pt was given 324 of ASA but refused Nitro.

## 2018-02-08 NOTE — ED Notes (Signed)
Pt became very aggitated, demanded to leave.  PA bedside.  IV taken out as she attempted to pull it herself.

## 2018-02-09 LAB — RAPID URINE DRUG SCREEN, HOSP PERFORMED
Amphetamines: NOT DETECTED
BARBITURATES: NOT DETECTED
BENZODIAZEPINES: NOT DETECTED
COCAINE: NOT DETECTED
Opiates: NOT DETECTED
TETRAHYDROCANNABINOL: NOT DETECTED

## 2018-02-09 LAB — ETHANOL: Alcohol, Ethyl (B): 415 mg/dL (ref <10)

## 2018-03-21 NOTE — ED Provider Notes (Signed)
[facility] EMERGENCY DEPARTMENT Provider Note   CSN: [id number] Arrival date & time: 02/08/18  2306     History   Chief Complaint Chief Complaint  Patient presents with  . Alcohol Intoxication  . Chest Pain    HPI Joann [name] is a 37 y.o. female.  HPI Joann [name] is a 37 y.o. female who presented to emergency department complaining of chest pain that started at home.  She has history of alcohol abuse and she states she has been trying to wean herself from drinking.  States taking Ativan at home but is not helping.  Had a few drinks around 2 PM today.  She states this evening started having chest pressure on the left side of the chest that now is better.  Patient was seen in triage, had blood work done, and then decided that she wanted to leave, nose was asked to come and see patient.  Patient denies any complaints to me and states she wants to be discharged home that she has no symptoms anymore.  History reviewed. No pertinent past medical history.  Patient Active Problem List   Diagnosis Date Noted  . Alcohol abuse with alcohol-induced mood disorder ([place]) 09/14/2017    History reviewed. No pertinent surgical history.   OB History   None      Home Medications    Prior to Admission medications   Not on File    Family History No family history on file.  Social History Social History   Tobacco Use  . Smoking status: Not on file  Substance Use Topics  . Alcohol use: Not on file  . Drug use: Not on file     Allergies   Patient has no known allergies.   Review of Systems Review of Systems  Constitutional: Negative for chills and fever.  Respiratory: Positive for chest tightness. Negative for cough and shortness of breath.   Cardiovascular: Positive for chest pain. Negative for palpitations and leg swelling.  Gastrointestinal: Negative for abdominal pain, diarrhea, nausea and vomiting.  Genitourinary: Negative for dysuria, flank pain, pelvic  pain, vaginal bleeding, vaginal discharge and vaginal pain.  Musculoskeletal: Negative for arthralgias, myalgias, neck pain and neck stiffness.  Skin: Negative for rash.  Neurological: Negative for dizziness, weakness and headaches.  All other systems reviewed and are negative.    Physical Exam Updated Vital Signs BP 129/76 (BP Location: Right Arm)   Pulse (!) 112   Temp 98.5 F (36.9 C) (Oral)   Resp 18   SpO2 98%   Physical Exam  Constitutional: She appears well-developed and well-nourished.  Agitated, screaming.  Appears to be somewhat intoxicated but able to communicate and carry on a conversation.  Eyes: Conjunctivae are normal.  Neck: Neck supple.  Neurological: She is alert.  Skin: Skin is warm and dry.  Nursing note and vitals reviewed.    ED Treatments / Results  Labs (all labs ordered are listed, but only abnormal results are displayed) Labs Reviewed  BASIC METABOLIC PANEL - Abnormal; Notable for the following components:      Result Value   Glucose, Bld 109 (*)    Calcium 8.7 (*)    All other components within normal limits  CBC - Abnormal; Notable for the following components:   RBC 3.62 (*)    Hemoglobin 11.0 (*)    HCT 34.0 (*)    RDW 16.0 (*)    Platelets 144 (*)    All other components within normal limits  ETHANOL -  Abnormal; Notable for the following components:   Alcohol, Ethyl (B) 415 (*)    All other components within normal limits  RAPID URINE DRUG SCREEN, HOSP PERFORMED  I-STAT TROPONIN, ED  I-STAT BETA HCG BLOOD, ED ([facility], [facility], [facility] ONLY)  I-STAT BETA HCG BLOOD, ED ([facility], [facility], [facility] ONLY)    EKG EKG Interpretation  Date/Time:  Saturday February 08 2018 23:16:04 EDT Ventricular Rate:  106 PR Interval:  146 QRS Duration: 76 QT Interval:  326 QTC Calculation: 433 R Axis:   49 Text Interpretation:  Sinus tachycardia Low voltage QRS Nonspecific T wave abnormality Abnormal ECG No prior ECG for comparison.  No STEMI Confirmed by Antony Blackbird [phone])  on 02/09/2018 11:27:27 AM   Radiology No results found.  Procedures Procedures (including critical care time)  Medications Ordered in ED Medications - No data to display   Initial Impression / Assessment and Plan / ED Course  I have reviewed the triage vital signs and the nursing notes.  Pertinent labs & imaging results that were available during my care of the patient were reviewed by me and considered in my medical decision making (see chart for details).     Patient is in triage, I was asked to come and see her.  Initially checked in for chest pain, however now states her pain is gone and she would like to have her IV removed and would like to go home.  She does appear to be intoxicated but able to communicate and ambulatory.  When she initially was triaged, nurses were concerned about alcohol withdrawal.  They stated that her CIWA score was 16.  On my evaluation, patient just appears to be intoxicated.  I have tried to talk her into staying for further evaluation and work-up especially in setting of her chest pain.  She is mildly tachycardic.  But patient is adamant about leaving.  Family members at bedside.  Husband is trying to talk her into staying, however patient is adamant that she wants to leave.  At this time patient is alert, oriented, she is capable of making her own decisions, although slightly agitated.  She is ambulatory and walked out of the emergency department.  I explained to her her condition could be life-threatening, and she voiced understanding.    Final Clinical Impressions(s) / ED Diagnoses   Final diagnoses:  Chest pain, unspecified type    ED Discharge Orders    None      Jeannett Senior, PA-C 03/21/18 [place], [place], DO 03/21/18 1951

## 2018-05-23 ENCOUNTER — Emergency Department (HOSPITAL_COMMUNITY)
Admission: EM | Admit: 2018-05-23 | Discharge: 2018-05-23 | Disposition: A | Discharge status: Home / Self Care | Payer: Medicaid Other | Attending: Emergency Medicine | Admitting: Emergency Medicine

## 2018-05-23 ENCOUNTER — Encounter (HOSPITAL_COMMUNITY): Payer: Self-pay | Admitting: *Deleted

## 2018-05-23 ENCOUNTER — Other Ambulatory Visit: Payer: Self-pay

## 2018-05-23 DIAGNOSIS — Z5321 Procedure and treatment not carried out due to patient leaving prior to being seen by health care provider: Secondary | ICD-10-CM

## 2018-05-23 DIAGNOSIS — F10229 Alcohol dependence with intoxication, unspecified: Principal | ICD-10-CM

## 2018-05-23 HISTORY — DX: Alcohol abuse, uncomplicated: F10.10

## 2018-05-23 LAB — CBC
HEMATOCRIT: 29.3 % — AB (ref 36.0–46.0)
Hemoglobin: 9.5 g/dL — ABNORMAL LOW (ref 12.0–15.0)
MCH: 34.8 pg — AB (ref 26.0–34.0)
MCHC: 32.4 g/dL (ref 30.0–36.0)
MCV: 107.3 fL — AB (ref 78.0–100.0)
PLATELETS: 133 10*3/uL — AB (ref 150–400)
RBC: 2.73 MIL/uL — ABNORMAL LOW (ref 3.87–5.11)
RDW: 15.4 % (ref 11.5–15.5)
WBC: 3.9 10*3/uL — ABNORMAL LOW (ref 4.0–10.5)

## 2018-05-23 LAB — COMPREHENSIVE METABOLIC PANEL
ALBUMIN: 3.5 g/dL (ref 3.5–5.0)
ALK PHOS: 218 U/L — AB (ref 38–126)
ALT: 64 U/L — AB (ref 0–44)
AST: 477 U/L — AB (ref 15–41)
Anion gap: 13 (ref 5–15)
BUN: 8 mg/dL (ref 6–20)
CALCIUM: 8.4 mg/dL — AB (ref 8.9–10.3)
CHLORIDE: 102 mmol/L (ref 98–111)
CO2: 21 mmol/L — AB (ref 22–32)
CREATININE: 0.45 mg/dL (ref 0.44–1.00)
GFR calc non Af Amer: >60 mL/min (ref >60)
GLUCOSE: 141 mg/dL — AB (ref 70–99)
Potassium: 3.1 mmol/L — ABNORMAL LOW (ref 3.5–5.1)
SODIUM: 136 mmol/L (ref 135–145)
Total Bilirubin: 0.8 mg/dL (ref 0.3–1.2)
Total Protein: 7.1 g/dL (ref 6.5–8.1)

## 2018-05-23 LAB — I-STAT BETA HCG BLOOD, ED (MC, WL, AP ONLY): I-stat hCG, quantitative: <5 m[IU]/mL (ref <5)

## 2018-05-23 LAB — ETHANOL: Alcohol, Ethyl (B): 385 mg/dL (ref <10)

## 2018-05-23 NOTE — ED Notes (Signed)
Patient cursing in lobby of Emergency Room off duty officer asked pt to calm/quiten down, but pt refused and left. No respiratory or acute distress noted alert and oriented x 3.

## 2018-05-23 NOTE — ED Triage Notes (Signed)
Pt in requesting help with ETOH detox, last drink just PTA, normally drinks one gallon of vodka daily and also 2-3 8% beers, denies other substance use

## 2018-05-23 NOTE — ED Triage Notes (Signed)
Pt states she wants a refill of ativan to attempt to detox at home

## 2018-05-23 NOTE — ED Notes (Signed)
Pt approached the desk stating that she "needed to be seen now because she felt like she was going to have a seizure".  The family member that was with her stated that she was here for alcohol withdraw and was paranoid that she was [place] have a seizure.  Myself and Dellis Filbert encouraged her that she was in the right place and we would get her seen but that we could not room her because she "felt" like she was going to have a seizure.  The pt asked, "so what if I have a seizure out here?"  I encouraged her that the situation would have changed and we would act accordingly.  At the time of this interaction her ETOH lab was 385.  Aaron Edelman and Julie-RN's are aware of this interaction.

## 2018-05-24 NOTE — ED Provider Notes (Signed)
It appears the patient left the emergency room without being seen by a provider.    Joann [name], Joann Cornea, MD 05/24/18 [phone]

## 2018-08-28 ENCOUNTER — Inpatient Hospital Stay (HOSPITAL_COMMUNITY)
Admission: EM | Admit: 2018-08-28 | Discharge: 2018-09-01 | DRG: 439 | Disposition: A | Discharge status: Home / Self Care | Payer: Medicaid Other | Attending: Internal Medicine | Admitting: Internal Medicine

## 2018-08-28 ENCOUNTER — Other Ambulatory Visit: Payer: Self-pay

## 2018-08-28 ENCOUNTER — Encounter (HOSPITAL_COMMUNITY): Payer: Self-pay

## 2018-08-28 DIAGNOSIS — Y908 Blood alcohol level of 240 mg/100 ml or more: Secondary | ICD-10-CM

## 2018-08-28 DIAGNOSIS — F10920 Alcohol use, unspecified with intoxication, uncomplicated: Secondary | ICD-10-CM

## 2018-08-28 DIAGNOSIS — F17200 Nicotine dependence, unspecified, uncomplicated: Secondary | ICD-10-CM

## 2018-08-28 DIAGNOSIS — K852 Alcohol induced acute pancreatitis without necrosis or infection: Principal | ICD-10-CM

## 2018-08-28 DIAGNOSIS — R569 Unspecified convulsions: Secondary | ICD-10-CM

## 2018-08-28 DIAGNOSIS — E876 Hypokalemia: Secondary | ICD-10-CM

## 2018-08-28 DIAGNOSIS — F10129 Alcohol abuse with intoxication, unspecified: Secondary | ICD-10-CM

## 2018-08-28 DIAGNOSIS — D531 Other megaloblastic anemias, not elsewhere classified: Secondary | ICD-10-CM

## 2018-08-28 DIAGNOSIS — D62 Acute posthemorrhagic anemia: Secondary | ICD-10-CM

## 2018-08-28 DIAGNOSIS — E872 Acidosis: Secondary | ICD-10-CM

## 2018-08-28 DIAGNOSIS — E162 Hypoglycemia, unspecified: Secondary | ICD-10-CM

## 2018-08-28 DIAGNOSIS — F1010 Alcohol abuse, uncomplicated: Secondary | ICD-10-CM | POA: Diagnosis not present

## 2018-08-28 DIAGNOSIS — Z9049 Acquired absence of other specified parts of digestive tract: Secondary | ICD-10-CM | POA: Diagnosis not present

## 2018-08-28 DIAGNOSIS — Z8719 Personal history of other diseases of the digestive system: Secondary | ICD-10-CM | POA: Diagnosis not present

## 2018-08-28 DIAGNOSIS — E8729 Other acidosis: Secondary | ICD-10-CM | POA: Diagnosis present

## 2018-08-28 DIAGNOSIS — Z9889 Other specified postprocedural states: Secondary | ICD-10-CM | POA: Diagnosis not present

## 2018-08-28 DIAGNOSIS — Z7289 Other problems related to lifestyle: Secondary | ICD-10-CM | POA: Diagnosis not present

## 2018-08-28 HISTORY — DX: Other acidosis: E87.29

## 2018-08-28 LAB — CBC WITH DIFFERENTIAL/PLATELET
ABS IMMATURE GRANULOCYTES: 0.05 10*3/uL (ref 0.00–0.07)
BASOS PCT: 0 %
Basophils Absolute: 0 10*3/uL (ref 0.0–0.1)
Eosinophils Absolute: 0 10*3/uL (ref 0.0–0.5)
Eosinophils Relative: 0 %
HCT: 37.2 % (ref 36.0–46.0)
Hemoglobin: 12.3 g/dL (ref 12.0–15.0)
Immature Granulocytes: 1 %
Lymphocytes Relative: 11 %
Lymphs Abs: 1 10*3/uL (ref 0.7–4.0)
MCH: 34.3 pg — ABNORMAL HIGH (ref 26.0–34.0)
MCHC: 33.1 g/dL (ref 30.0–36.0)
MCV: 103.6 fL — AB (ref 80.0–100.0)
MONOS PCT: 14 %
Monocytes Absolute: 1.2 10*3/uL — ABNORMAL HIGH (ref 0.1–1.0)
Neutro Abs: 6.5 10*3/uL (ref 1.7–7.7)
Neutrophils Relative %: 74 %
Platelets: 138 10*3/uL — ABNORMAL LOW (ref 150–400)
RBC: 3.59 MIL/uL — ABNORMAL LOW (ref 3.87–5.11)
RDW: 14 % (ref 11.5–15.5)
WBC: 8.8 10*3/uL (ref 4.0–10.5)
nRBC: 0 % (ref 0.0–0.2)

## 2018-08-28 LAB — ETHANOL: Alcohol, Ethyl (B): 503 mg/dL (ref <10)

## 2018-08-28 LAB — I-STAT CG4 LACTIC ACID, ED: Lactic Acid, Venous: 2.55 mmol/L (ref 0.5–1.9)

## 2018-08-28 LAB — COMPREHENSIVE METABOLIC PANEL
ALT: 71 U/L — ABNORMAL HIGH (ref 0–44)
AST: 639 U/L — ABNORMAL HIGH (ref 15–41)
Albumin: 4 g/dL (ref 3.5–5.0)
Alkaline Phosphatase: 289 U/L — ABNORMAL HIGH (ref 38–126)
Anion gap: 28 — ABNORMAL HIGH (ref 5–15)
BUN: 8 mg/dL (ref 6–20)
CO2: 15 mmol/L — ABNORMAL LOW (ref 22–32)
Calcium: 8.6 mg/dL — ABNORMAL LOW (ref 8.9–10.3)
Chloride: 90 mmol/L — ABNORMAL LOW (ref 98–111)
Creatinine, Ser: 0.58 mg/dL (ref 0.44–1.00)
GFR calc Af Amer: >60 mL/min (ref >60)
GFR calc non Af Amer: >60 mL/min (ref >60)
Glucose, Bld: 83 mg/dL (ref 70–99)
Potassium: 3.8 mmol/L (ref 3.5–5.1)
Sodium: 133 mmol/L — ABNORMAL LOW (ref 135–145)
TOTAL PROTEIN: 8.4 g/dL — AB (ref 6.5–8.1)
Total Bilirubin: 3.2 mg/dL — ABNORMAL HIGH (ref 0.3–1.2)

## 2018-08-28 LAB — MAGNESIUM: Magnesium: 1.8 mg/dL (ref 1.7–2.4)

## 2018-08-28 LAB — URINALYSIS, ROUTINE W REFLEX MICROSCOPIC
Bilirubin Urine: NEGATIVE
GLUCOSE, UA: NEGATIVE mg/dL
Hgb urine dipstick: NEGATIVE
Ketones, ur: 20 mg/dL — AB
Leukocytes, UA: NEGATIVE
Nitrite: NEGATIVE
PH: 6 (ref 5.0–8.0)
Protein, ur: 30 mg/dL — AB
Specific Gravity, Urine: 1.004 — ABNORMAL LOW (ref 1.005–1.030)

## 2018-08-28 LAB — MRSA PCR SCREENING: MRSA by PCR: NEGATIVE

## 2018-08-28 LAB — I-STAT VENOUS BLOOD GAS, ED
ACID-BASE DEFICIT: 6 mmol/L — AB (ref 0.0–2.0)
Bicarbonate: 19.4 mmol/L — ABNORMAL LOW (ref 20.0–28.0)
O2 Saturation: 96 %
TCO2: 20 mmol/L — ABNORMAL LOW (ref 22–32)
pCO2, Ven: 35.7 mmHg — ABNORMAL LOW (ref 44.0–60.0)
pH, Ven: 7.344 (ref 7.250–7.430)
pO2, Ven: 84 mmHg — ABNORMAL HIGH (ref 32.0–45.0)

## 2018-08-28 LAB — I-STAT BETA HCG BLOOD, ED (MC, WL, AP ONLY): I-stat hCG, quantitative: <5 m[IU]/mL (ref <5)

## 2018-08-28 LAB — LIPASE, BLOOD: Lipase: 2396 U/L — ABNORMAL HIGH (ref 11–51)

## 2018-08-28 LAB — CBG MONITORING, ED: Glucose-Capillary: 87 mg/dL (ref 70–99)

## 2018-08-28 LAB — PHOSPHORUS: Phosphorus: 4.9 mg/dL — ABNORMAL HIGH (ref 2.5–4.6)

## 2018-08-28 LAB — BETA-HYDROXYBUTYRIC ACID: BETA-HYDROXYBUTYRIC ACID: 3.21 mmol/L — AB (ref 0.05–0.27)

## 2018-08-28 MED ORDER — LORAZEPAM 2 MG/ML IJ SOLN: 0.0000 mg | Freq: Two times a day (BID) | INTRAMUSCULAR | Status: DC

## 2018-08-28 MED ORDER — THIAMINE HCL 100 MG/ML IJ SOLN
100.0000 mg | Freq: Every day | INTRAMUSCULAR | Status: DC
  Administered 2018-08-28: 100 mg via INTRAVENOUS
  Filled 2018-08-28: qty 2

## 2018-08-28 MED ORDER — PROMETHAZINE HCL 25 MG PO TABS: 12.5000 mg | ORAL_TABLET | Freq: Four times a day (QID) | ORAL | Status: DC | PRN

## 2018-08-28 MED ORDER — ACETAMINOPHEN 650 MG RE SUPP: 650.0000 mg | Freq: Four times a day (QID) | RECTAL | Status: DC | PRN

## 2018-08-28 MED ORDER — VITAMIN B-1 100 MG PO TABS: 100.0000 mg | ORAL_TABLET | Freq: Every day | ORAL | Status: DC

## 2018-08-28 MED ORDER — LORAZEPAM 2 MG/ML IJ SOLN
2.0000 mg | INTRAMUSCULAR | Status: DC | PRN
  Administered 2018-08-28 – 2018-08-29 (×2): 2 mg via INTRAMUSCULAR
  Filled 2018-08-28 (×3): qty 1

## 2018-08-28 MED ORDER — LORAZEPAM 1 MG PO TABS: 0.0000 mg | ORAL_TABLET | Freq: Four times a day (QID) | ORAL | Status: DC

## 2018-08-28 MED ORDER — VITAMIN B-1 100 MG PO TABS
100.0000 mg | ORAL_TABLET | Freq: Every day | ORAL | Status: DC
  Administered 2018-08-28 – 2018-08-31 (×4): 100 mg via ORAL
  Filled 2018-08-28 (×4): qty 1

## 2018-08-28 MED ORDER — SENNOSIDES-DOCUSATE SODIUM 8.6-50 MG PO TABS: 1.0000 | ORAL_TABLET | Freq: Every evening | ORAL | Status: DC | PRN

## 2018-08-28 MED ORDER — ACETAMINOPHEN 325 MG PO TABS
650.0000 mg | ORAL_TABLET | Freq: Four times a day (QID) | ORAL | Status: DC | PRN
  Administered 2018-08-28: 650 mg via ORAL
  Filled 2018-08-28: qty 2

## 2018-08-28 MED ORDER — CHLORDIAZEPOXIDE HCL 25 MG PO CAPS
25.0000 mg | ORAL_CAPSULE | Freq: Once | ORAL | Status: AC
  Administered 2018-08-28: 25 mg via ORAL
  Filled 2018-08-28: qty 1

## 2018-08-28 MED ORDER — FOLIC ACID 1 MG PO TABS
1.0000 mg | ORAL_TABLET | Freq: Every day | ORAL | Status: DC
  Administered 2018-08-28 – 2018-08-31 (×4): 1 mg via ORAL
  Filled 2018-08-28 (×4): qty 1

## 2018-08-28 MED ORDER — ADULT MULTIVITAMIN W/MINERALS CH
1.0000 | ORAL_TABLET | Freq: Every day | ORAL | Status: DC
  Administered 2018-08-28 – 2018-08-31 (×4): 1 via ORAL
  Filled 2018-08-28 (×4): qty 1

## 2018-08-28 MED ORDER — LACTATED RINGERS IV SOLN
INTRAVENOUS | Status: AC
  Administered 2018-08-28: 16:00:00 via INTRAVENOUS

## 2018-08-28 MED ORDER — PANTOPRAZOLE SODIUM 40 MG IV SOLR
40.0000 mg | Freq: Once | INTRAVENOUS | Status: AC
  Administered 2018-08-28: 40 mg via INTRAVENOUS
  Filled 2018-08-28: qty 40

## 2018-08-28 MED ORDER — LORAZEPAM 1 MG PO TABS: 0.0000 mg | ORAL_TABLET | Freq: Two times a day (BID) | ORAL | Status: DC

## 2018-08-28 MED ORDER — LORAZEPAM 2 MG/ML IJ SOLN
0.0000 mg | Freq: Four times a day (QID) | INTRAMUSCULAR | Status: DC
  Administered 2018-08-28 – 2018-08-29 (×4): 2 mg via INTRAVENOUS
  Filled 2018-08-28 (×4): qty 1

## 2018-08-28 MED ORDER — LACTATED RINGERS IV BOLUS: 1000.0000 mL | Freq: Once | INTRAVENOUS | Status: DC

## 2018-08-28 MED ORDER — LACTATED RINGERS IV BOLUS
1000.0000 mL | Freq: Once | INTRAVENOUS | Status: AC
  Administered 2018-08-28: 1000 mL via INTRAVENOUS

## 2018-08-28 MED ORDER — ENOXAPARIN SODIUM 40 MG/0.4ML ~~LOC~~ SOLN
40.0000 mg | SUBCUTANEOUS | Status: DC
  Administered 2018-08-28 – 2018-08-29 (×2): 40 mg via SUBCUTANEOUS
  Filled 2018-08-28 (×2): qty 0.4

## 2018-08-28 NOTE — ED Notes (Signed)
ED Provider at bedside. 

## 2018-08-28 NOTE — ED Notes (Signed)
Pt given ice chips, per Gerald Stabs - RN. Pt eating ice chips.

## 2018-08-28 NOTE — H&P (Signed)
Date: 08/28/2018               Patient Name:  Joann [name] MRN: [id number]  DOB: Mar 20, 1981 Age / Sex: 37 y.o., female   PCP: Patient, No Pcp Per         Medical Service: Internal Medicine Teaching Service         Attending Physician: Dr. Sid Falcon, MD    First Contact: Dr. Gilberto Better, MD Pager: [phone]  Second Contact: Dr. Lars Mage, MD Pager: [phone]       After Hours (After 5p/  First Contact Pager: [phone]  weekends / holidays): Second Contact Pager: [phone]   Chief Complaint: Abdominal Pain  History of Present Illness: [name] is a 37 yo F w/ PMH of EtOH abuse and cholecystitis s/p cholecystectomy who presents to the hospital with abdominal pain. She states she was in her usual state of health until 3 days ago when she began to have left sided abdominal pain that appeared to migrate to the middle. She describes the pain as 10/10 stabbing pain (now 7/10) that is constant. She mentions no obvious inciting event such as trauma to the area. She mentions that she began to have con-comitant chills, nausea and vomiting and thought she noticed some coffee ground emesis. She also mentions that she had a prior history of cholecystectomy and no longer has gall stones. She denies any diarrhea but states she is constipated and her last bowel movement was 5 days ago. She provides additional history about her history of alcohol abuse and states she has had prior admissions for alcohol withdrawal in the past. She drinks about a 12 pack every day and her last drink was yesterday.  In the ED he was found to have EtOH level of 503, Lipase of 2000, AST 639, Ketonuria and elevated beta-hydroxybutyrate. VBG showed pH of 7.34. Internal medicine was consulted for admission.  Meds:  None  Allergies: Allergies as of 08/28/2018  . (No Known Allergies)   Past Medical History:  Diagnosis Date  . Alcohol abuse     Family History:  Mother, father with history of diabetes. No further  history of cancer, high choelsterol, liver disease, early heart attack.  Social History:  Denies tobacco, illicit substance use. Lives with husband. Has son and daughter. 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 Mentions prior history of admission for withdrawal symptoms  Review of Systems: A complete ROS was negative except as per HPI.  Physical Exam: Blood pressure 109/82, pulse (!) 107, temperature 98.5 F (36.9 C), temperature source Oral, resp. rate 10, height 5' 4"  (1.626 m), weight 66.8 kg, SpO2 100 %. Physical Exam  Constitutional: She is oriented to person, place, and time and well-developed, well-nourished, and in no distress. No distress.  somnolent  HENT:  Head: Normocephalic and atraumatic.  Mouth/Throat: Oropharynx is clear and moist. No oropharyngeal exudate.  Eyes: Pupils are equal, round, and reactive to light. Conjunctivae and EOM are normal. Scleral icterus is present.  Neck: Normal range of motion. Neck supple.  Cardiovascular: Regular rhythm, normal heart sounds and intact distal pulses.  No murmur heard. tachycardic  Pulmonary/Chest: Effort normal and breath sounds normal. She has no wheezes. She has no rales.  Abdominal: Soft. Bowel sounds are normal. There is abdominal tenderness (epigastric/umbilical tenderness to palpation). There is no rebound and no guarding.  Musculoskeletal: Normal range of motion.        General: No edema.  Neurological: She is alert and oriented to  person, place, and time. No cranial nerve deficit. GCS score is 15.  Somnolent. No Asterixis  Skin: Skin is warm and dry. She is not diaphoretic.  Poor skin turgor   EKG: N/A  CXR: N/A  Assessment & Plan by Problem: Active Problems:   Alcoholic ketoacidosis  [name] is a 36 yo F w/ PMH of EtOH abuse presenting with abdominal pain. She came to the ED with elevated Alcohol level, elevated lipase and elevated LFTs and meets 2/3 criteria for acute alcoholic pancreatitis. We will provide  supportive care with bowel rest, fluid resuscitation and pain control. She is also an active daily drinker and will need to be on CIWA for withdrawal symptoms.   Alcohol-induced Pancreatitis Epigastric abdominal pain. Lipase >3x normal limit. No imaging studies but diagnosis of alcoholic pancreatitis apparent.  - 2L LR bolus - IV LR 100cc/hr - NPO - Pain already improving. Will add on pain control if worsens  EtOH abuse 12 pack daily drinker. Prior hx of severe withdrawal - CIWA w/ Ativan - Thiamine, Folate  Dispo: Admit patient to Inpatient with expected length of stay greater than 2 midnights.  Signed: Mosetta Anis, MD 08/28/2018, 5:13 PM  Pager: Pager: [phone]

## 2018-08-28 NOTE — ED Notes (Signed)
Patient provided with warm blanket. While in room with patient, she yelled "Give me some Ativan, I need Ativan RIGHT NOW!!! Some Ativan and some food! And call my husband, tell him I'm staying. I need help. Bring me that Ativan NOW."

## 2018-08-28 NOTE — ED Provider Notes (Addendum)
[facility] EMERGENCY DEPARTMENT Provider Note   CSN: [id number] Arrival date & time: 08/28/18  0944     History   Chief Complaint Chief Complaint  Patient presents with  . Abdominal Pain  . Alcohol Problem    HPI Joann [name] is a 37 y.o. female.  Patient with a history of alcoholism presents with 3 days of diffuse abdominal pain. She called her daughter saying that her stomach hurt and "she needed to go to the ER." Her daughter is unsure of how much liquor she drank.Level 5 caveat due to alcohol intoxication  The history is provided by a relative. The history is limited by the condition of the patient. No language interpreter was used.  Abdominal Pain    Alcohol Problem  Associated symptoms include abdominal pain.       Past Medical History:  Diagnosis Date  . Alcohol abuse     Patient Active Problem List   Diagnosis Date Noted  . Alcoholic ketoacidosis 51/76/1607  . Alcohol abuse with alcohol-induced mood disorder ([place]) 09/14/2017    History reviewed. No pertinent surgical history.   OB History   No obstetric history on file.      Home Medications    Prior to Admission medications   Not on File    Family History History reviewed. No pertinent family history.  Social History Social History   Tobacco Use  . Smoking status: Current Every Day Smoker  . Smokeless tobacco: Never Used  Substance Use Topics  . Alcohol use: Yes    Comment: "I just do it"  . Drug use: Not on file     Allergies   Patient has no known allergies.   Review of Systems Review of Systems  Unable to perform ROS: Mental status change (intoxication)  Gastrointestinal: Positive for abdominal pain.     Physical Exam Updated Vital Signs BP 109/82 (BP Location: Left Arm)   Pulse (!) 107   Temp 98.5 F (36.9 C) (Oral)   Resp 10   Ht 5' 4"  (1.626 m)   Wt 66.8 kg   SpO2 100%   BMI 25.28 kg/m   Physical Exam HENT:     Head: Normocephalic and  atraumatic.     Mouth/Throat:     Mouth: Mucous membranes are dry.     Pharynx: Oropharynx is clear.  Eyes:     Extraocular Movements: Extraocular movements intact.     Pupils: Pupils are equal, round, and reactive to light.  Neck:     Musculoskeletal: Normal range of motion and neck supple.  Cardiovascular:     Rate and Rhythm: Regular rhythm. Tachycardia present.     Heart sounds: Normal heart sounds.  Pulmonary:     Effort: Pulmonary effort is normal.     Breath sounds: Normal breath sounds.  Abdominal:     General: Bowel sounds are normal.     Palpations: Abdomen is soft.     Tenderness: There is no abdominal tenderness. There is no guarding or rebound.  Musculoskeletal:        General: No deformity or signs of injury.       Arms:     Comments: Full ROM in all 4 extremities, patient moving without difficulty    Skin:    General: Skin is warm and dry.     Comments: Healing abrasions on bilateral knees  Neurological:     General: No focal deficit present.     Mental Status: She is alert and  oriented to person, place, and time.  Psychiatric:        Mood and Affect: Mood is anxious.      ED Treatments / Results  Labs (all labs ordered are listed, but only abnormal results are displayed) Labs Reviewed  LIPASE, BLOOD - Abnormal; Notable for the following components:      Result Value   Lipase 2,396 (*)    All other components within normal limits  CBC WITH DIFFERENTIAL/PLATELET - Abnormal; Notable for the following components:   RBC 3.59 (*)    MCV 103.6 (*)    MCH 34.3 (*)    Platelets 138 (*)    Monocytes Absolute 1.2 (*)    All other components within normal limits  COMPREHENSIVE METABOLIC PANEL - Abnormal; Notable for the following components:   Sodium 133 (*)    Chloride 90 (*)    CO2 15 (*)    Calcium 8.6 (*)    Total Protein 8.4 (*)    AST 639 (*)    ALT 71 (*)    Alkaline Phosphatase 289 (*)    Total Bilirubin 3.2 (*)    Anion gap 28 (*)    All  other components within normal limits  ETHANOL - Abnormal; Notable for the following components:   Alcohol, Ethyl (B) 503 (*)    All other components within normal limits  URINALYSIS, ROUTINE W REFLEX MICROSCOPIC - Abnormal; Notable for the following components:   Specific Gravity, Urine 1.004 (*)    Ketones, ur 20 (*)    Protein, ur 30 (*)    Bacteria, UA RARE (*)    All other components within normal limits  PHOSPHORUS - Abnormal; Notable for the following components:   Phosphorus 4.9 (*)    All other components within normal limits  BETA-HYDROXYBUTYRIC ACID - Abnormal; Notable for the following components:   Beta-Hydroxybutyric Acid 3.21 (*)    All other components within normal limits  I-STAT CG4 LACTIC ACID, ED - Abnormal; Notable for the following components:   Lactic Acid, Venous 2.55 (*)    All other components within normal limits  I-STAT VENOUS BLOOD GAS, ED - Abnormal; Notable for the following components:   pCO2, Ven 35.7 (*)    pO2, Ven 84.0 (*)    Bicarbonate 19.4 (*)    TCO2 20 (*)    Acid-base deficit 6.0 (*)    All other components within normal limits  MRSA PCR SCREENING  MAGNESIUM  BLOOD GAS, VENOUS  HIV ANTIBODY (ROUTINE TESTING W REFLEX)  CBG MONITORING, ED  I-STAT BETA HCG BLOOD, ED ([facility], [facility], [facility] ONLY)  I-STAT CG4 LACTIC ACID, ED    EKG None  Radiology No results found.  Procedures .Critical Care Performed by: Cherre Robins, PA-C Authorized by: Cherre Robins, PA-C   Critical care provider statement:    Critical care time (minutes):  45   Critical care was necessary to treat or prevent imminent or life-threatening deterioration of the following conditions:  Dehydration and renal failure   Critical care was time spent personally by me on the following activities:  Discussions with consultants, evaluation of patient's response to treatment, examination of patient, ordering and performing treatments and interventions, ordering and review of  laboratory studies, pulse oximetry, re-evaluation of patient's condition, obtaining history from patient or surrogate, review of old charts and development of treatment plan with patient or surrogate   (including critical care time)  Medications Ordered in ED Medications  LORazepam (ATIVAN) injection 0-4 mg (  2 mg Intravenous Given 08/28/18 1055)    Or  LORazepam (ATIVAN) tablet 0-4 mg ( Oral See Alternative 08/28/18 1055)  LORazepam (ATIVAN) injection 0-4 mg (has no administration in time range)    Or  LORazepam (ATIVAN) tablet 0-4 mg (has no administration in time range)  enoxaparin (LOVENOX) injection 40 mg (has no administration in time range)  lactated ringers infusion (has no administration in time range)  acetaminophen (TYLENOL) tablet 650 mg (has no administration in time range)    Or  acetaminophen (TYLENOL) suppository 650 mg (has no administration in time range)  senna-docusate (Senokot-S) tablet 1 tablet (has no administration in time range)  promethazine (PHENERGAN) tablet 12.5 mg (has no administration in time range)  LORazepam (ATIVAN) injection 2-3 mg (has no administration in time range)  multivitamin with minerals tablet 1 tablet (1 tablet Oral Given 08/28/18 1342)  thiamine (VITAMIN B-1) tablet 100 mg (100 mg Oral Given 75/17/00 1749)  folic acid (FOLVITE) tablet 1 mg (1 mg Oral Given 08/28/18 1342)  chlordiazePOXIDE (LIBRIUM) capsule 25 mg (has no administration in time range)  lactated ringers bolus 1,000 mL (has no administration in time range)  pantoprazole (PROTONIX) injection 40 mg (40 mg Intravenous Given 08/28/18 1056)  lactated ringers bolus 1,000 mL (0 mLs Intravenous Stopped 08/28/18 1252)    Patient has blood alcohol level of 503 on arrival. She first complained of diffuse abdominal pain and later said pain is LUQ. She has not eaten in 2 days and has had several episodes of emesis prior to arrival.   Labs were concerning for alcohol-induced pancreatitis  with lipase of 4496 and alcoholic ketoacidosis with anion gap of 28 and beta-hydroxybutric acid of 3.21.  Admission recommended for pancreatitis and detox, patient is agreeable.  Initial Impression / Assessment and Plan / ED Course  I have reviewed the triage vital signs and the nursing notes.  Pertinent labs & imaging results that were available during my care of the patient were reviewed by me and considered in my medical decision making (see chart for details).       Final Clinical Impressions(s) / ED Diagnoses   Final diagnoses:  Alcohol-induced acute pancreatitis, unspecified complication status  Alcoholic ketoacidosis  Alcoholic intoxication without complication [facility])    ED Discharge Orders    None       Flint Melter 08/28/18 1516    Quintella Reichert, MD 08/29/18 [place], Bowler E, PA-C 08/29/18 1217    Quintella Reichert, MD 08/29/18 1237

## 2018-08-28 NOTE — ED Notes (Signed)
Pt's CBG result was 87. Informed Megan - RN.

## 2018-08-28 NOTE — ED Provider Notes (Signed)
1:20 PM Patient seen in conjunction with Albrizze PA-C. Patient who is intoxicated with abdominal pain. Brought in by family. Elevated lipase with elevated anion gap. Suspect compensated alcoholic ketoacidosis due to heavy alcohol use.   Pt given fluids, placed on CIWA.   Spoke with IMTS for unassigned admit.   BP 110/79   Pulse (!) 119   Temp 98 F (36.7 C) (Oral)   Resp 20   Ht 5' 8"  (1.727 m)   Wt 63.5 kg   SpO2 98%   BMI 21.29 kg/m     Carlisle Cater, PA-C 08/28/18 1322    Quintella Reichert, MD 08/29/18 [phone]

## 2018-08-28 NOTE — ED Notes (Signed)
Purewick placed on pt. 

## 2018-08-28 NOTE — ED Triage Notes (Signed)
Pt arrives to ED with complaints of abdominal pain per family. Pt appears to be severely intoxicated, reports drinking liquor prior to arrival. Pt is disoriented, states she has had problems with her liver in the past and repeatedly asks staff if we can "check my belly and alcoholism". Daughter bedside at this time, does not know how much or what kind of alcohol the pt consumed.

## 2018-08-28 NOTE — Care Management Note (Signed)
Case Management Note  Patient Details  Name: Manisha Cancel MRN: [id number] Date of Birth: 11/06/1980  Subjective/Objective:    From home, she states she has medicaid co pay of 3.80., she has a follow up apt scheduled at Primary care at [facility] , listed on AVS               Action/Plan: NCM will follow for transition of care needs.  Expected Discharge Date:                  Expected Discharge Plan:  Home/Self Care  In-House Referral:     Discharge planning Services  CM Consult, Follow-up appt scheduled  Post Acute Care Choice:    Choice offered to:     DME Arranged:    DME Agency:     HH Arranged:    HH Agency:     Status of Service:  Completed, signed off  If discussed at [employer] of Stay Meetings, dates discussed:    Additional Comments:  Zenon Mayo, RN 08/28/2018, 5:35 PM

## 2018-08-29 ENCOUNTER — Inpatient Hospital Stay (HOSPITAL_COMMUNITY): Payer: Medicaid Other

## 2018-08-29 ENCOUNTER — Encounter (HOSPITAL_COMMUNITY): Payer: Self-pay

## 2018-08-29 DIAGNOSIS — F1010 Alcohol abuse, uncomplicated: Secondary | ICD-10-CM

## 2018-08-29 DIAGNOSIS — F10920 Alcohol use, unspecified with intoxication, uncomplicated: Secondary | ICD-10-CM

## 2018-08-29 DIAGNOSIS — D62 Acute posthemorrhagic anemia: Secondary | ICD-10-CM

## 2018-08-29 DIAGNOSIS — Z8719 Personal history of other diseases of the digestive system: Secondary | ICD-10-CM

## 2018-08-29 DIAGNOSIS — E872 Acidosis: Secondary | ICD-10-CM

## 2018-08-29 DIAGNOSIS — Z9049 Acquired absence of other specified parts of digestive tract: Secondary | ICD-10-CM

## 2018-08-29 DIAGNOSIS — K852 Alcohol induced acute pancreatitis without necrosis or infection: Principal | ICD-10-CM

## 2018-08-29 LAB — COMPREHENSIVE METABOLIC PANEL
ALT: 63 U/L — ABNORMAL HIGH (ref 0–44)
AST: 573 U/L — ABNORMAL HIGH (ref 15–41)
Albumin: 3.5 g/dL (ref 3.5–5.0)
Alkaline Phosphatase: 247 U/L — ABNORMAL HIGH (ref 38–126)
Anion gap: 27 — ABNORMAL HIGH (ref 5–15)
BUN: 8 mg/dL (ref 6–20)
CO2: 14 mmol/L — ABNORMAL LOW (ref 22–32)
Calcium: 8.1 mg/dL — ABNORMAL LOW (ref 8.9–10.3)
Chloride: 92 mmol/L — ABNORMAL LOW (ref 98–111)
Creatinine, Ser: 0.76 mg/dL (ref 0.44–1.00)
GFR calc Af Amer: >60 mL/min (ref >60)
Glucose, Bld: 60 mg/dL — ABNORMAL LOW (ref 70–99)
Potassium: 4 mmol/L (ref 3.5–5.1)
Sodium: 133 mmol/L — ABNORMAL LOW (ref 135–145)
TOTAL PROTEIN: 7.3 g/dL (ref 6.5–8.1)
Total Bilirubin: 3.8 mg/dL — ABNORMAL HIGH (ref 0.3–1.2)

## 2018-08-29 LAB — CBC
HCT: 30.6 % — ABNORMAL LOW (ref 36.0–46.0)
HCT: 24.8 % — ABNORMAL LOW (ref 36.0–46.0)
Hemoglobin: 10.2 g/dL — ABNORMAL LOW (ref 12.0–15.0)
Hemoglobin: 8.6 g/dL — ABNORMAL LOW (ref 12.0–15.0)
MCH: 35.2 pg — ABNORMAL HIGH (ref 26.0–34.0)
MCH: 35.2 pg — ABNORMAL HIGH (ref 26.0–34.0)
MCHC: 33.3 g/dL (ref 30.0–36.0)
MCHC: 34.7 g/dL (ref 30.0–36.0)
MCV: 105.5 fL — ABNORMAL HIGH (ref 80.0–100.0)
MCV: 101.6 fL — ABNORMAL HIGH (ref 80.0–100.0)
PLATELETS: 60 10*3/uL — AB (ref 150–400)
Platelets: 91 10*3/uL — ABNORMAL LOW (ref 150–400)
RBC: 2.9 MIL/uL — AB (ref 3.87–5.11)
RBC: 2.44 MIL/uL — ABNORMAL LOW (ref 3.87–5.11)
RDW: 13.8 % (ref 11.5–15.5)
RDW: 13.7 % (ref 11.5–15.5)
WBC: 8.6 10*3/uL (ref 4.0–10.5)
WBC: 7.7 10*3/uL (ref 4.0–10.5)
nRBC: 0 % (ref 0.0–0.2)
nRBC: 0.4 % — ABNORMAL HIGH (ref 0.0–0.2)

## 2018-08-29 LAB — VITAMIN B12: Vitamin B-12: 443 pg/mL (ref 180–914)

## 2018-08-29 LAB — HIV ANTIBODY (ROUTINE TESTING W REFLEX): HIV Screen 4th Generation wRfx: NONREACTIVE

## 2018-08-29 LAB — ABO/RH: ABO/RH(D): O POS

## 2018-08-29 LAB — PREPARE RBC (CROSSMATCH)

## 2018-08-29 LAB — GLUCOSE, CAPILLARY
GLUCOSE-CAPILLARY: 55 mg/dL — AB (ref 70–99)
GLUCOSE-CAPILLARY: 118 mg/dL — AB (ref 70–99)

## 2018-08-29 MED ORDER — LACTATED RINGERS IV SOLN: INTRAVENOUS | Status: DC

## 2018-08-29 MED ORDER — LORAZEPAM 1 MG PO TABS: 1.0000 mg | ORAL_TABLET | Freq: Four times a day (QID) | ORAL | Status: DC | PRN

## 2018-08-29 MED ORDER — CHLORDIAZEPOXIDE HCL 25 MG PO CAPS
25.0000 mg | ORAL_CAPSULE | Freq: Three times a day (TID) | ORAL | Status: DC | PRN
  Administered 2018-08-30: 25 mg via ORAL
  Filled 2018-08-29: qty 1

## 2018-08-29 MED ORDER — DEXTROSE IN LACTATED RINGERS 5 % IV SOLN
INTRAVENOUS | Status: AC
  Administered 2018-08-29: 11:00:00 via INTRAVENOUS

## 2018-08-29 MED ORDER — LORAZEPAM 2 MG/ML IJ SOLN
1.0000 mg | Freq: Four times a day (QID) | INTRAMUSCULAR | Status: DC | PRN
  Administered 2018-08-29: 2 mg via INTRAVENOUS

## 2018-08-29 MED ORDER — CHLORDIAZEPOXIDE HCL 25 MG PO CAPS
25.0000 mg | ORAL_CAPSULE | Freq: Once | ORAL | Status: AC
  Administered 2018-08-29: 25 mg via ORAL
  Filled 2018-08-29: qty 1

## 2018-08-29 MED ORDER — IOHEXOL 300 MG/ML  SOLN
100.0000 mL | Freq: Once | INTRAMUSCULAR | Status: AC | PRN
  Administered 2018-08-29: 100 mL via INTRAVENOUS

## 2018-08-29 MED ORDER — DEXTROSE 50 % IV SOLN
25.0000 mL | Freq: Once | INTRAVENOUS | Status: AC
  Administered 2018-08-29: 25 mL via INTRAVENOUS

## 2018-08-29 MED ORDER — DEXTROSE 50 % IV SOLN
INTRAVENOUS | Status: AC
  Administered 2018-08-29: 25 mL via INTRAVENOUS
  Filled 2018-08-29: qty 50

## 2018-08-29 MED ORDER — SODIUM CHLORIDE 0.9% IV SOLUTION
Freq: Once | INTRAVENOUS | Status: AC
  Administered 2018-08-29: 20:00:00 via INTRAVENOUS

## 2018-08-29 MED ORDER — HYDROMORPHONE HCL 1 MG/ML IJ SOLN
0.5000 mg | INTRAMUSCULAR | Status: DC | PRN
  Administered 2018-08-29 – 2018-09-01 (×4): 0.5 mg via INTRAVENOUS
  Filled 2018-08-29 (×4): qty 1

## 2018-08-29 MED ORDER — LORAZEPAM 2 MG/ML IJ SOLN: 1.0000 mg | Freq: Four times a day (QID) | INTRAMUSCULAR | Status: DC | PRN

## 2018-08-29 NOTE — Progress Notes (Signed)
  Date: 08/29/2018  Patient name: Joann [name]  Medical record number: [id number]  Date of birth: 14-Feb-1981   I have seen and evaluated this patient and I have discussed the plan of care with the house staff. Please see Dr. Marguerita Beards note for complete details. I concur with his findings with the following additions/corrections:   Dr. Truman Hayward also called and we discussed acute changes from this afternoon.  If she has any further bloody emesis, would start IV PPI and call GI for evaluation.  I am not clear if she is actively bleeding, or if the nausea/emesis was related to known pancreatitis.  She will need to be monitored closely overnight.  If she has VS changes, transfer to progressive status.   Sid Falcon, MD 08/29/2018, 8:22 PM

## 2018-08-29 NOTE — Progress Notes (Signed)
   Subjective:  Joann [name] is a 37 y.o. with PMH of EtOH abuse and cholecystitis s/p cholecystectomy admit for acute pancreatitis on hospital day 1  Joann [name] was examined and evaluated at bedside this AM. She was observed laying in bed appearing uncomfortable. She states she wants to go home and does not feel good. She had tried some clear liquids night time per night team and states her pain is worse. She mentions that she has been having some tremors and continues to endorse pain but denies any additional nausea or vomiting.   Objective:  Vital signs in last 24 hours: Vitals:   08/29/18 0427 08/29/18 0500 08/29/18 0600 08/29/18 0723  BP: 120/72   109/72  Pulse: (!) 118 (!) 129 (!) 129 (!) 126  Resp: 20 (!) 21 (!) 21 (!) 28  Temp: 98.3 F (36.8 C)   98.9 F (37.2 C)  TempSrc: Oral   Oral  SpO2: 96% 99% 98% 100%  Weight:      Height:       Physical Exam  Constitutional: She is oriented to person, place, and time and well-developed, well-nourished, and in no distress.  somnolent  HENT:  Head: Normocephalic and atraumatic.  Mouth/Throat: Oropharynx is clear and moist. No oropharyngeal exudate.  Eyes: Pupils are equal, round, and reactive to light. Conjunctivae and EOM are normal. No scleral icterus.  Neck: Normal range of motion. Neck supple. No JVD present.  Cardiovascular: Regular rhythm and intact distal pulses.  No murmur heard. Tachycardic  Pulmonary/Chest: Effort normal and breath sounds normal. She has no wheezes. She has no rales.  Abdominal: Soft. Bowel sounds are normal. There is abdominal tenderness (epigastric/umbilical tenderness to palpation. ). There is no rebound and no guarding.  Musculoskeletal: Normal range of motion.        General: No edema.  Neurological: She is oriented to person, place, and time. No cranial nerve deficit.  Skin: Skin is warm and dry.     Assessment/Plan:  Active Problems:   Alcoholic ketoacidosis  Joann [name] is a 37 y.o. with PMH  of EtOH abuse and cholecystitis s/p cholecystectomy admit for acute pancreatitis. She had a trial of clear liquids overnight but had worsening pain and she was started on D5/LR for hypoglycemia. She is probably not ready for oral intake and will need continued bowel rest. Her recovery is complicated by her alcohol use history as she will most likely begin to experience withdrawal symptoms soon.  Acute alcoholic pancreatitis Admit lipase 2396. Continuing to endorse abdominal pain. No leukocytosis. Afebrile.  - C/w Npo - Pain control w/ Dilaudid 0.60m q4hr PRN - C/w LR/D5 @ 200cc/ hr  Hx of EtOH abuse 12 pack drinker. Describes prior admission for alcohol withdrawal - CIWA protcol w/ Ativan - Librium 233mdaily PRN - C/w folate 66m19maily, thiamine 100m80mily   Gap metabolic acidosis 2/2 alcohol ketosis + beta-hydroxybutyrate, ketones on UA, Gap 27 this AM. Co2 14.  - C/w aggressive fluid resuscitation - Trend BMP  DVT prophx: Lovenox Diet: NPO Code: Full  Dispo: Anticipated discharge in approximately 2-3 day(s).   Joann [name] 08/29/2018, 11:59 AM Pager: [phone]

## 2018-08-29 NOTE — Progress Notes (Addendum)
Paged by nursing about hemoptysis. Evaluated at bedside with Senior resident. Found to have bite marks on her tongue and bloody clot. Patient unclear about seizure history. AAOx2 to name and year. She describes significant amount of bloody vomit. Tachycardic at 112.  - PM Hgb to check for bleed  Paged by nursing about husband requesting to speak. Mentions by her husband about prior history of withdrawal symptoms requiring inpatient admission with seizures. Also provides additional history of significant intraperitoneal bleed due to ectopic pregnancy. He states concerns about patient's status and emphasizes close follow up for alcohol abstinence.   Informed by nursing staff about results of repeat hgb of 8.6. Downward trend from 12.3->10.2->8.6. Ms.Reddinger was evaluated again and states she feels more tired than before. Continuing to endorse abdominal pain with radiation to bilateral flanks.  Gen: Somnolent but awaken to voice and able to converse HEENT: conjunctival pallor, previously observed bite mark on tongue Neck: supple, ROM intact CV: RRR, S1, S2 normal, No rubs, no murmurs, no gallops Pulm: CTAB, No rales, no wheezes, no dullness to percussion  Abd: Soft, Distended, Diminished bowel sounds, Epigastric and umbilical tenderness to palpation. Bilateral flank tenderness.  Extm: ROM intact, Peripheral pulses intact, No peripheral edema Skin: Dry, Warm, normal turgor, pale  Discussed with attending about change in status. Concerning for retroperitoneal bleed or necrotizing pancreatitis in setting of 2 point drop in hemoglobin in 12 hour period. Continuing to be tachycardic at 110s. Tachypneic at 30s. Will need imaging to evaluate.  - Stat CT Abd/Pelvis w/ contrast - 1 unit pRBC now - C/w fluid resuscitation - Trend CBC  Gilberto Better, PGY1 Pager: [phone]

## 2018-08-29 NOTE — Progress Notes (Addendum)
Initial Nutrition Assessment  DOCUMENTATION CODES:   Not applicable  INTERVENTION:    Advance diet as medically appropriate, add supplements as needed  NUTRITION DIAGNOSIS:   Inadequate oral intake related to altered GI function(acute pancreatitis) as evidenced by NPO status  GOAL:   Patient will meet greater than or equal to 90% of their needs  MONITOR:   Diet advancement, PO intake, Supplement acceptance, Labs, Skin, Weight trends  REASON FOR ASSESSMENT:   Malnutrition Screening Tool  ASSESSMENT:   37 yo Female with h/o ETOH abuse, withdrawal and cholecystectomy who presented to the hospital with abdominal pain and acute intoxication.  RD sleeping soundly upon RD visit.  Spoke with [employer], Therapist, sports.   Pt is currently NPO given acute pancreatitis (alcohol induced). Has been experiencing abdominal pain; some N/V as well. Per H&P pt is a daily drinker; has hx of severe withdrawal.  Medications reviewed; on CIWA Protocol. Receiving thiamine, folate and MVI daily. Labs reviewed. Na 133 (L).  CBG's [id number].  NUTRITION - FOCUSED PHYSICAL EXAM:  Unable to complete at this time.  Diet Order:   Diet Order            Diet NPO time specified  Diet effective now             EDUCATION NEEDS:   Not appropriate for education at this time  Skin:  Skin Assessment: Reviewed RN Assessment  Last BM:  12/19  Height:   Ht Readings from Last 1 Encounters:  08/28/18 5' 4"  (1.626 m)   Weight:   Wt Readings from Last 1 Encounters:  08/28/18 66.8 kg   BMI:  Body mass index is 25.28 kg/m.  Estimated Nutritional Needs:   Kcal:     Protein:     Fluid:     Arthur Holms, RD, LDN Pager #: [phone] After-Hours Pager #: [phone]

## 2018-08-29 NOTE — Progress Notes (Signed)
Around 1500 RN arrived to pt's room and pt had coughed up blood onto pillow. RN observed pt's mouth, and there were multiple clots of blood. Pt said she might have bitten her tongue. RN spoke with husband once he arrived, husband asked to call MD to speak with him, husband stated, " when she has detoxed from alcohol before she has had seizures." RN paged MD to room, and also alerted MD of Hgb level of 8.6.  MD to bedside, RN will continue to monitor.  Riley Kill RN

## 2018-08-29 NOTE — Progress Notes (Signed)
IMTS paged regarding glucose value of 60 on morning labs. Awaiting orders. Will continue to closely monitor.

## 2018-08-29 NOTE — Progress Notes (Signed)
Return call received from IMTS regarding low blood glucose. Instructed to give D50 and see if the patient is ready to eat. 63m of D50 given and food/juice given to patient. Will recheck blood glucose in 15 minutes. Will continue to closely monitor.

## 2018-08-30 DIAGNOSIS — E876 Hypokalemia: Secondary | ICD-10-CM

## 2018-08-30 DIAGNOSIS — F10129 Alcohol abuse with intoxication, unspecified: Secondary | ICD-10-CM

## 2018-08-30 LAB — CBC WITH DIFFERENTIAL/PLATELET
Abs Immature Granulocytes: 0.08 10*3/uL — ABNORMAL HIGH (ref 0.00–0.07)
BASOS ABS: 0 10*3/uL (ref 0.0–0.1)
Basophils Relative: 0 %
Eosinophils Absolute: 0 10*3/uL (ref 0.0–0.5)
Eosinophils Relative: 0 %
HCT: 29.5 % — ABNORMAL LOW (ref 36.0–46.0)
Hemoglobin: 10.1 g/dL — ABNORMAL LOW (ref 12.0–15.0)
Immature Granulocytes: 1 %
Lymphocytes Relative: 9 %
Lymphs Abs: 0.8 10*3/uL (ref 0.7–4.0)
MCH: 33.7 pg (ref 26.0–34.0)
MCHC: 34.2 g/dL (ref 30.0–36.0)
MCV: 98.3 fL (ref 80.0–100.0)
Monocytes Absolute: 0.7 10*3/uL (ref 0.1–1.0)
Monocytes Relative: 8 %
Neutro Abs: 7 10*3/uL (ref 1.7–7.7)
Neutrophils Relative %: 82 %
Platelets: 63 10*3/uL — ABNORMAL LOW (ref 150–400)
RBC: 3 MIL/uL — ABNORMAL LOW (ref 3.87–5.11)
RDW: 16.2 % — ABNORMAL HIGH (ref 11.5–15.5)
WBC: 8.6 10*3/uL (ref 4.0–10.5)
nRBC: 0 % (ref 0.0–0.2)

## 2018-08-30 LAB — TYPE AND SCREEN
ABO/RH(D): O POS
Antibody Screen: NEGATIVE
Unit division: 0

## 2018-08-30 LAB — COMPREHENSIVE METABOLIC PANEL
ALT: 51 U/L — ABNORMAL HIGH (ref 0–44)
AST: 398 U/L — ABNORMAL HIGH (ref 15–41)
Albumin: 2.9 g/dL — ABNORMAL LOW (ref 3.5–5.0)
Alkaline Phosphatase: 222 U/L — ABNORMAL HIGH (ref 38–126)
Anion gap: 14 (ref 5–15)
BUN: <5 mg/dL — ABNORMAL LOW (ref 6–20)
CO2: 25 mmol/L (ref 22–32)
Calcium: 8.3 mg/dL — ABNORMAL LOW (ref 8.9–10.3)
Chloride: 91 mmol/L — ABNORMAL LOW (ref 98–111)
Creatinine, Ser: 0.54 mg/dL (ref 0.44–1.00)
GFR calc Af Amer: >60 mL/min (ref >60)
GFR calc non Af Amer: >60 mL/min (ref >60)
Glucose, Bld: 120 mg/dL — ABNORMAL HIGH (ref 70–99)
Potassium: 3 mmol/L — ABNORMAL LOW (ref 3.5–5.1)
SODIUM: 130 mmol/L — AB (ref 135–145)
Total Bilirubin: 5.3 mg/dL — ABNORMAL HIGH (ref 0.3–1.2)
Total Protein: 6.3 g/dL — ABNORMAL LOW (ref 6.5–8.1)

## 2018-08-30 LAB — BPAM RBC
Blood Product Expiration Date: 202001152359
ISSUE DATE / TIME: 201912202005
Unit Type and Rh: 5100

## 2018-08-30 LAB — LIPASE, BLOOD: Lipase: 1026 U/L — ABNORMAL HIGH (ref 11–51)

## 2018-08-30 MED ORDER — DEXTROSE IN LACTATED RINGERS 5 % IV SOLN
INTRAVENOUS | Status: DC
  Administered 2018-08-30: 12:00:00 via INTRAVENOUS

## 2018-08-30 MED ORDER — POTASSIUM CHLORIDE CRYS ER 20 MEQ PO TBCR
40.0000 meq | EXTENDED_RELEASE_TABLET | Freq: Once | ORAL | Status: AC
  Administered 2018-08-30: 40 meq via ORAL
  Filled 2018-08-30: qty 2

## 2018-08-30 MED ORDER — POTASSIUM CHLORIDE 2 MEQ/ML IV SOLN
INTRAVENOUS | Status: DC
  Administered 2018-08-30 – 2018-08-31 (×3): via INTRAVENOUS
  Filled 2018-08-30 (×4): qty 1000

## 2018-08-30 NOTE — Progress Notes (Signed)
   Subjective:   Joann [name] was seen laying in her bed this morning. She stated that she was not in any acute pain. She mentioned that she was hungry and wanted to eat. She only used one prn pain medication yesterday .   Objective:  Vital signs in last 24 hours: Vitals:   08/30/18 0500 08/30/18 0537 08/30/18 0600 08/30/18 0828  BP:  106/65  99/61  Pulse: (!) 109 (!) 122 (!) 101 (!) 116  Resp: (!) 27 (!) 24 (!) 26 (!) 24  Temp:    98.7 F (37.1 C)  TempSrc:    Oral  SpO2: 95% 96% 98% 95%  Weight:      Height:       Physical Exam  Constitutional: Appears well-developed and well-nourished. No distress.  HENT:  Head: Normocephalic and atraumatic. Icteric sclera Eyes: Conjunctivae are normal.  Cardiovascular: Normal rate, regular rhythm and normal heart sounds.  Respiratory: Effort normal and breath sounds normal. No respiratory distress. No wheezes.  GI: Soft. Bowel sounds are normal. No distension.Mild tenderness to palpation at luq and epigastric region. Musculoskeletal: No edema.  Neurological: Is alert.  Skin: Not diaphoretic. No erythema.  Psychiatric: Normal mood and affect. Behavior is normal. Judgment and thought content normal.    Assessment/Plan:  Active Problems:   Alcoholic ketoacidosis   Alcohol-induced acute pancreatitis   Alcoholic intoxication without complication (HCC)   Acute blood loss anemia  Joann [name] is a 37 year old with alcohol use disorder and prior cholecystitis s/p cholecystectomy who presented in acute alcohol intoxication with pancreatitis and ketoacidosis.  Acute alcoholic pancreatitis The patient is currently afebrile without leukocytosis.  Lipase trending down from 2000-1000.  Liver enzymes also trending down AST = 398, ALT = 51, total bilirubin increased to 5.3.  She has only used 1 dose of Dilaudid over the past 24 hours. CT abd and pelvis not showing any necrotizing pancreatitis or retroperitoneal bleed. Hb stable at 10.1.  -Dilaudid 0.5 mg  every 4 hours PRN -Started clear liquid diet today Hypokalemia Potassium = 3.0 likely due to npo. Will replete.  Alcohol use disorder Patient continues to have tachycardia and tachypnea. Concern for possible seizure episode 12/20 as patient bleeding from tongue and multiple clots in mouth. Patient's last etoh use 12/16 or 12/17.   -Chlordiazepoxide 25 mg 3 times daily as needed -Continue C1 with Ativan protocol  Alcoholic ketoacidosis Anion gap has decreased to 14 from 27 and bicarbonate = 25.  Dispo: Anticipated discharge in approximately  day(s).   Lars Mage, MD 08/30/2018, 11:08 AM Pager: [phone]

## 2018-08-31 LAB — COMPREHENSIVE METABOLIC PANEL
ALT: 40 U/L (ref 0–44)
AST: 216 U/L — ABNORMAL HIGH (ref 15–41)
Albumin: 2.5 g/dL — ABNORMAL LOW (ref 3.5–5.0)
Alkaline Phosphatase: 215 U/L — ABNORMAL HIGH (ref 38–126)
Anion gap: 8 (ref 5–15)
BUN: <5 mg/dL — ABNORMAL LOW (ref 6–20)
CO2: 24 mmol/L (ref 22–32)
Calcium: 8.6 mg/dL — ABNORMAL LOW (ref 8.9–10.3)
Chloride: 104 mmol/L (ref 98–111)
Creatinine, Ser: 0.45 mg/dL (ref 0.44–1.00)
GFR calc Af Amer: >60 mL/min (ref >60)
GFR calc non Af Amer: >60 mL/min (ref >60)
Glucose, Bld: 92 mg/dL (ref 70–99)
POTASSIUM: 3.7 mmol/L (ref 3.5–5.1)
SODIUM: 136 mmol/L (ref 135–145)
Total Bilirubin: 3.1 mg/dL — ABNORMAL HIGH (ref 0.3–1.2)
Total Protein: 5.9 g/dL — ABNORMAL LOW (ref 6.5–8.1)

## 2018-08-31 LAB — FOLATE RBC
FOLATE, HEMOLYSATE: 248.5 ng/mL
Folate, RBC: 1010 ng/mL (ref >498)
Hematocrit: 24.6 % — ABNORMAL LOW (ref 34.0–46.6)

## 2018-08-31 LAB — LIPASE, BLOOD: LIPASE: 722 U/L — AB (ref 11–51)

## 2018-08-31 MED ORDER — LACTATED RINGERS IV SOLN
INTRAVENOUS | Status: DC
  Administered 2018-08-31 – 2018-09-01 (×2): via INTRAVENOUS

## 2018-08-31 MED ORDER — PROMETHAZINE HCL 12.5 MG PO TABS
6.2500 mg | ORAL_TABLET | Freq: Four times a day (QID) | ORAL | Status: DC | PRN
  Filled 2018-08-31: qty 1

## 2018-08-31 NOTE — Progress Notes (Signed)
   Subjective:   Joann [name] was seen resting in her bed this morning. She stated that her abdominal pain is continuing to improve. She asked for regular diet.   Objective:  Vital signs in last 24 hours: Vitals:   08/31/18 0400 08/31/18 0500 08/31/18 0600 08/31/18 0800  BP:    112/80  Pulse: 83 79 80 85  Resp: (!) 24   15  Temp:    98.6 F (37 C)  TempSrc:    Oral  SpO2: 98% 96% 100% 98%  Weight:      Height:       Physical Exam  Constitutional: She appears well-developed and well-nourished. No distress.  HENT:  Head: Normocephalic and atraumatic.  Eyes: Conjunctivae are normal.  Cardiovascular: Normal rate, regular rhythm and normal heart sounds.  Respiratory: Breath sounds normal. No respiratory distress. She has no wheezes.  GI: Soft. Bowel sounds are normal. She exhibits no distension. There is abdominal tenderness (epigastric and luq pain).  Musculoskeletal:        General: No edema.  Neurological: She is alert.  Skin: She is not diaphoretic. No erythema.  Psychiatric: She has a normal mood and affect. Her behavior is normal. Judgment and thought content normal.   Assessment/Plan:  Joann [name] is a 37 year old with alcohol use disorder and prior cholecystitis s/p cholecystectomy who presented in acute alcohol intoxication with pancreatitis and ketoacidosis.  Acute alcoholic pancreatitis Patient's liver enzymes, lipase 722, and bilirubin continue to trend down. Her pain is adequately controlled. She is afebrile.   Will advance to bland soft diet today 12/22. Continuing Lr at 48m/hr.  -CT abd and pelvis not showing any necrotizing pancreatitis or retroperitoneal bleed. Hb stable at 10.1. -Dilaudid 0.5 mg every 4 hours PRN  Hypokalemia Resolved  Alcohol use disorder Patient is with normal heart rate and respirations today. Last etoh 12/16 or 12/17. Monitor for withdrawal symptoms.   -Chlordiazepoxide 25 mg 3 times daily as needed -Continue CIWA with Ativan  protocol  Alcoholic ketoacidosis Resolved  Dispo: Anticipated discharge in approximately 1 day.   CLars Mage MD 08/31/2018, 10:35 AM Pager: [phone]

## 2018-09-01 DIAGNOSIS — Z7289 Other problems related to lifestyle: Secondary | ICD-10-CM

## 2018-09-01 DIAGNOSIS — Z9889 Other specified postprocedural states: Secondary | ICD-10-CM

## 2018-09-01 LAB — COMPREHENSIVE METABOLIC PANEL
ALT: 38 U/L (ref 0–44)
AST: 178 U/L — ABNORMAL HIGH (ref 15–41)
Albumin: 2.6 g/dL — ABNORMAL LOW (ref 3.5–5.0)
Alkaline Phosphatase: 245 U/L — ABNORMAL HIGH (ref 38–126)
Anion gap: 11 (ref 5–15)
BUN: <5 mg/dL — ABNORMAL LOW (ref 6–20)
CO2: 23 mmol/L (ref 22–32)
Calcium: 8.5 mg/dL — ABNORMAL LOW (ref 8.9–10.3)
Chloride: 99 mmol/L (ref 98–111)
Creatinine, Ser: 0.39 mg/dL — ABNORMAL LOW (ref 0.44–1.00)
GFR calc Af Amer: >60 mL/min (ref >60)
Glucose, Bld: 93 mg/dL (ref 70–99)
Potassium: 3.4 mmol/L — ABNORMAL LOW (ref 3.5–5.1)
Sodium: 133 mmol/L — ABNORMAL LOW (ref 135–145)
TOTAL PROTEIN: 6.2 g/dL — AB (ref 6.5–8.1)
Total Bilirubin: 2 mg/dL — ABNORMAL HIGH (ref 0.3–1.2)

## 2018-09-01 NOTE — Progress Notes (Addendum)
   Subjective: Joann [name] was seen resting in bed comfortably this morning. She denied any pain, tremors, sweating, heart palpitations, nausea, vomiting, or shortness of breath.  Objective:  Vital signs in last 24 hours: Vitals:   09/01/18 0300 09/01/18 0400 09/01/18 0500 09/01/18 0600  BP: 106/80     Pulse: 94     Resp: 16 12 13 15   Temp: 98.4 F (36.9 C)     TempSrc: Oral     SpO2: 97%     Weight:    69.7 kg  Height:       Physical Exam  Constitutional: She is oriented to person, place, and time and well-developed, well-nourished, and in no distress.  Cardiovascular: Normal rate, regular rhythm and normal heart sounds.  No murmur heard. Pulmonary/Chest: Effort normal and breath sounds normal. No respiratory distress. She has no wheezes.  Abdominal: Soft. Bowel sounds are normal. She exhibits no distension. There is no abdominal tenderness.  Musculoskeletal:        General: No edema.  Neurological: She is alert and oriented to person, place, and time.  Skin: Skin is warm and dry.  Psychiatric: Mood, memory, affect and judgment normal.    Assessment/Plan:  Active Problems:   Alcoholic ketoacidosis   Alcohol-induced acute pancreatitis   Alcoholic intoxication without complication (HCC)   Acute blood loss anemia  Joann [name] is a 37 year old with alcohol use disorder and prior cholecystitis s/p cholecystectomy who presented in acute alcohol intoxication with pancreatitis and ketoacidosis.  Acute alcoholic pancreatitis Patient's liver enzymes and bilirubin continue to trend down; alk phos increased from 215 to 245. Her pain is adequately controlled. She is afebrile. Tolerating bland soft diet well. She is stable for discharge today  Alcohol use disorder Patient is with normal heart rate and respirations today. Last etoh 12/16 or 12/17. Monitor for withdrawal symptoms.  -Chlordiazepoxide 25 mg 3 times daily as needed -Continue CIWA with Ativan protocol   Dispo: Patient is  medically stable for discharge today.  Alex Mcmanigal N, DO 09/01/2018, 10:30 AM Pager: [phone]

## 2018-09-01 NOTE — Discharge Summary (Signed)
Name: Joann [name] MRN: [id number] DOB: 14-May-1981 37 y.o. PCP: Patient, No Pcp Per  Date of Admission: 08/28/2018  9:46 AM Date of Discharge: 12/23/201912/23/2019 Attending Physician: No att. providers found  Discharge Diagnosis: 1. Acute alcoholic pancreatitis 2. Alcohol use disorder 3. Alcoholic ketoacidosis  4. Hypokalemia  Discharge Medications: Allergies as of 09/01/2018   No Known Allergies     Medication List    You have not been prescribed any medications.     Disposition and follow-up:   Joann [name] was discharged from [facility] in Stable condition.  At the hospital follow up visit please address:  1.  Alcoholic pancreatitis: please reassess patient's pain and alcohol use  2.  Labs / imaging needed at time of follow-up: cmp  3.  Pending labs/ test needing follow-up: none  Follow-up Appointments: Follow-up Information    [facility] Follow up on 09/16/2018.   Why:  8:30 for hospital follow up, phone is [phone] Contact information: [address]  [place] [address]          Hospital Course by problem list: 1. Acute alcoholic pancreatitis- Patient presented with abdominal pain, an elevated alcohol level, lipase and LFT's meeting 2/3 criteria for acute alcoholic pancreatitis. She was provided with supportive care with bowel rest, fluid resuscitation and pain control. Admit lipase 2396, trended down to 722. Her Hgb trended down and there was concern for retroperitoneal bleed or necrotizing pancreatitis. She was given 1 unit PRBC. CT abdomen pelvis was negative.   2. Alcohol use disorder- Patient is a 12 pack daily drinker with a prior hx of severe withdrawal. She was put on CIWA protocol and started on librium 25 mg daily prn.   3. Alcoholic ketoacidosis-  + beta-hydroxybutyrate, ketones on UA, [place] 27 and Co2 14. Continued aggressive fluid resuscitation and this resolved.   4.  Hypokalemia- orally repleted.  Discharge Vitals:   BP 106/80 (BP Location: Left Arm)   Pulse 94   Temp 98.4 F (36.9 C) (Oral)   Resp 15   Ht 5' 4"  (1.626 m)   Wt 69.7 kg   LMP 08/02/2018   SpO2 97%   BMI 26.38 kg/m   Pertinent Labs, Studies, and Procedures:   CMP Latest Ref Rng & Units 09/01/2018 08/31/2018 08/30/2018  Glucose 70 - 99 mg/dL 93 92 120(H)  BUN 6 - 20 mg/dL <5(L) <5(L) <5(L)  Creatinine 0.44 - 1.00 mg/dL 0.39(L) 0.45 0.54  Sodium 135 - 145 mmol/L 133(L) 136 130(L)  Potassium 3.5 - 5.1 mmol/L 3.4(L) 3.7 3.0(L)  Chloride 98 - 111 mmol/L 99 104 91(L)  CO2 22 - 32 mmol/L 23 24 25   Calcium 8.9 - 10.3 mg/dL 8.5(L) 8.6(L) 8.3(L)  Total Protein 6.5 - 8.1 g/dL 6.2(L) 5.9(L) 6.3(L)  Total Bilirubin 0.3 - 1.2 mg/dL 2.0(H) 3.1(H) 5.3(H)  Alkaline Phos 38 - 126 U/L 245(H) 215(H) 222(H)  AST 15 - 41 U/L 178(H) 216(H) 398(H)  ALT 0 - 44 U/L 38 40 51(H)   Lipase     Component Value Date/Time   LIPASE 722 (H) 08/31/2018 0715     Discharge Instructions: Discharge Instructions    Diet - low sodium heart healthy   Complete by:  As directed    Discharge instructions   Complete by:  As directed    Joann [name],  Please schedule a hospital follow up appointment with [facility] for next week. There number is [phone].  Thanks for allowing [place] to be a part of your care! Happy Holidays!   Increase activity slowly   Complete by:  As directed       Signed: Rohini Jaroszewski N, DO 09/01/2018, 11:32 AM   LTGAI:[phone]

## 2018-09-01 NOTE — Progress Notes (Signed)
Patient discharged to home. Patient went home by cab, husband called cab for wife. IV removed, all patient belongings accounted for. Taken down to entrance by CNA.

## 2018-09-01 NOTE — Plan of Care (Signed)
Discussed with patient plan of care for the evening, pain management and using non medication remedies for it with some teach back displayed.

## 2018-09-16 ENCOUNTER — Inpatient Hospital Stay: Payer: Self-pay | Admitting: Family Medicine

## 2018-09-26 ENCOUNTER — Encounter: Payer: Self-pay | Admitting: Family Medicine

## 2018-09-29 ENCOUNTER — Encounter: Payer: Self-pay | Admitting: Family Medicine

## 2018-09-29 ENCOUNTER — Ambulatory Visit (INDEPENDENT_AMBULATORY_CARE_PROVIDER_SITE_OTHER): Payer: Medicaid Other | Admitting: Family Medicine

## 2018-09-29 VITALS — BP 118/82 | HR 78 | Temp 98.2°F | Resp 17 | Ht 65.5 in | Wt 150.6 lb

## 2018-09-29 DIAGNOSIS — Z131 Encounter for screening for diabetes mellitus: Secondary | ICD-10-CM

## 2018-09-29 DIAGNOSIS — D649 Anemia, unspecified: Secondary | ICD-10-CM

## 2018-09-29 DIAGNOSIS — E872 Acidosis: Secondary | ICD-10-CM

## 2018-09-29 DIAGNOSIS — Z23 Encounter for immunization: Secondary | ICD-10-CM

## 2018-09-29 DIAGNOSIS — K852 Alcohol induced acute pancreatitis without necrosis or infection: Secondary | ICD-10-CM

## 2018-09-29 DIAGNOSIS — Z7689 Persons encountering health services in other specified circumstances: Principal | ICD-10-CM

## 2018-09-29 DIAGNOSIS — Z1389 Encounter for screening for other disorder: Secondary | ICD-10-CM

## 2018-09-29 DIAGNOSIS — R945 Abnormal results of liver function studies: Secondary | ICD-10-CM

## 2018-09-29 DIAGNOSIS — E8729 Other acidosis: Secondary | ICD-10-CM

## 2018-09-29 DIAGNOSIS — R7989 Other specified abnormal findings of blood chemistry: Secondary | ICD-10-CM

## 2018-09-29 MED ORDER — PANTOPRAZOLE SODIUM 40 MG PO TBEC: 40.0000 mg | DELAYED_RELEASE_TABLET | Freq: Every day | ORAL | 3 refills | Status: DC

## 2018-09-29 NOTE — Progress Notes (Signed)
Joann [name], is a 38 y.o. female  PXT:[id number]  OEV:[id number]  DOB - 10/12/1980  CC:  Chief Complaint  Patient presents with  . Establish Care  . Hospitalization Follow-up    ED->Hosp 12/19-12/23: alcohol induced pancreatisi, alcohol ketoacidosis       HPI: Joann [name] is a 38 y.o. female is here today to establish care and hospital follow-up.  Joann [name] has Alcohol abuse with alcohol-induced mood disorder ([place]); Alcoholic ketoacidosis; Alcohol-induced acute pancreatitis; Alcoholic intoxication without complication ([place]); and Acute blood loss anemia on their problem list.   Patient symptoms are admitted alcohol addiction recently hospitalized for alcohol induced pancreatitis and ketoacidosis.  During her hospitalization her liver enzymes peaked at AST 398/ALT 51. At discharge, AST 178. On admission her lipase 2396 prior to discharge,lipase reduced 744. She was discharged 09/01/2018. Complains of ongoing abdominal pain with nausea no vomiting. She is accompanied by her significant other who requesting that medication to stop her alcohol abuse. Patient is not verbalizing a desiring to quit. she drinks daily beer and liquor. This problem has been present for over 7-10 years. She quit once for 3 months after an inpatient rehabilitation treatment program. At present, she doesn't know if she is ready to go back to a inpatient program although maybe open to outpatient rehabilitation at some point. She is wanting another child but reports a history of an ectopic pregnancy in which she sustained damage to a fallopian tube. LMP in December.   Patient denies new headaches, chest pain, nausea, new weakness , numbness or tingling, SOB, edema, or worrisome cough.  Current medications:No current outpatient medications on file.   Pertinent family medical history: family history includes Alcohol abuse in her father; Diabetes in her father; Healthy in her brother, brother, brother, and mother; Hypertension in her  maternal grandfather and maternal grandmother.   No Known Allergies  Social History   Socioeconomic History  . Marital status: Married    Spouse name: Not on file  . Number of children: Not on file  . Years of education: Not on file  . Highest education level: Not on file  Occupational History  . Not on file  Social Needs  . Financial resource strain: Not on file  . Food insecurity:    Worry: Not on file    Inability: Not on file  . Transportation needs:    Medical: Not on file    Non-medical: Not on file  Tobacco Use  . Smoking status: Never Smoker  . Smokeless tobacco: Never Used  Substance and Sexual Activity  . Alcohol use: Yes    Comment: "I just do it"  . Drug use: Not on file  . Sexual activity: Not on file  Lifestyle  . Physical activity:    Days per week: Not on file    Minutes per session: Not on file  . Stress: Not on file  Relationships  . Social connections:    Talks on phone: Not on file    Gets together: Not on file    Attends religious service: Not on file    Active member of club or organization: Not on file    Attends meetings of clubs or organizations: Not on file    Relationship status: Not on file  . Intimate partner violence:    Fear of current or ex partner: Not on file    Emotionally abused: Not on file    Physically abused: Not on file    Forced sexual activity: Not on file  Other Topics Concern  . Not on file  Social History Narrative  . Not on file    Review of Systems: Pertinent negatives listed in HPi  Objective:   Vitals:   09/29/18 1407  BP: 118/82  Pulse: 78  Resp: 17  Temp: 98.2 F (36.8 C)    BP Readings from Last 3 Encounters:  09/29/18 118/82  09/01/18 106/80  05/23/18 (!) 123/91    Filed Weights   09/29/18 1407  Weight: 150 lb 9.6 oz (68.3 kg)      Physical Exam: Constitutional: Patient appears well-developed and well-nourished. No distress. HENT: Normocephalic, atraumatic, External right and left  ear normal. Oropharynx is clear and moist.  Eyes: Conjunctivae and EOM are normal. PERRLA, no scleral icterus. Neck: Normal ROM. Neck supple. No JVD. No tracheal deviation. No thyromegaly. CVS: RRR, S1/S2 +, no murmurs, no gallops, no carotid bruit.  Pulmonary: Effort and breath sounds normal, no stridor, rhonchi, wheezes, rales.  Abdominal: Soft. BS +, no distension, tenderness, rebound or guarding.  Musculoskeletal: Normal range of motion. No edema and no tenderness.  Neuro: Alert. Normal muscle tone coordination. Normal gait. BUE and BLE strength 5/5. Bilateral hand grips symmetrical. No cranial nerve deficit. Skin: Skin is warm and dry. No rash noted. Not diaphoretic. No erythema. No pallor. Psychiatric: Normal mood and affect. Behavior, judgment, thought content normal.  Lab Results (prior encounters)  Lab Results  Component Value Date   WBC 8.6 08/30/2018   HGB 10.1 (L) 08/30/2018   HCT 29.5 (L) 08/30/2018   MCV 98.3 08/30/2018   PLT 63 (L) 08/30/2018   Lab Results  Component Value Date   CREATININE 0.39 (L) 09/01/2018   BUN <5 (L) 09/01/2018   NA 133 (L) 09/01/2018   K 3.4 (L) 09/01/2018   CL 99 09/01/2018   CO2 23 09/01/2018      Assessment and plan:  1. Encounter to establish care 2. Alcohol-induced acute pancreatitis without infection or necrosis - Hepatitis c vrs RNA detect by PCR-qual - Comprehensive metabolic panel - Vitamin B1 - Iron, TIBC and Ferritin Panel  3. Alcoholic ketoacidosis 4. Anemia, unspecified type - CBC with Differential  5. Screening for diabetes mellitus - Hemoglobin A1c  6. Elevated LFTs - Hepatitis c vrs RNA detect by PCR-qual  7. Screening for blood or protein in urine  8. Need for Tdap vaccination - Tdap vaccine greater than or equal to 7yo IM   Patient will schedule an appointment with our licensed counselor or social worker to discuss alcohol addiction and treatment options along with coping and triggers for drinking.  Patient  educated extensively regarding the adverse congenital effects of alcohol abuse on an unborn fetus she is unable to urinate today therefore urine pregnancy was not obtained.  Caution to discontinue efforts to get pregnant until she has achieved sobriety.  Patient verbalized understanding.  Patient will return for Pap in 1 month. We will check an A1c, hepatitis C, CBC, and CMP today.   Orders Placed This Encounter  Procedures  . Tdap vaccine greater than or equal to 7yo IM  . Hemoglobin A1c  . Hepatitis c vrs RNA detect by PCR-qual  . CBC with Differential  . Comprehensive metabolic panel  . Vitamin B1  . Iron, TIBC and Ferritin Panel   Meds ordered this encounter  Medications  . pantoprazole (PROTONIX) 40 MG tablet    Sig: Take 1 tablet (40 mg total) by mouth at bedtime.    Dispense:  30 tablet  Refill:  3    Return for [facility] and PAP with provider on same day.   The patient was given clear instructions to go to ER or return to medical center if symptoms don't improve, worsen or new problems develop. The patient verbalized understanding. The patient was advised  to call and obtain lab results if they haven't heard anything from out office within 7-10 business days.  Molli Barrows, FNP Primary Care at [facility] [address], [place] [address] [phone]: [phone]   This note has been created with Dragon speech recognition software and sEngineer, materials Any transcriptional errors are unintentional.

## 2018-09-29 NOTE — Patient Instructions (Addendum)
Pap Test Why am I having this test? A Pap test, also called a Pap smear, is a screening test to check for signs of:  Cancer of the vagina, cervix, and uterus. The cervix is the lower part of the uterus that opens into the vagina.  Infection.  Changes that may be a sign that cancer is developing (precancerous changes). Women need this test on a regular basis. In general, you should have a Pap test every 3 years until you reach menopause or age 40. Women aged 30-60 may choose to have their Pap test done at the same time as an HPV (human papillomavirus) test every 5 years (instead of every 3 years). Your health care provider may recommend having Pap tests more or less often depending on your medical conditions and past Pap test results. What kind of sample is taken?  Your health care provider will collect a sample of cells from the surface of your cervix. This will be done using a small cotton swab, plastic spatula, or brush. This sample is often collected during a pelvic exam, when you are lying on your back on an exam table with feet in footrests (stirrups). In some cases, fluids (secretions) from the cervix or vagina may also be collected. How do I prepare for this test?  Be aware of where you are in your menstrual cycle. If you are menstruating on the day of the test, you may be asked to reschedule.  You may need to reschedule if you have a known vaginal infection on the day of the test.  Follow instructions from your health care provider about: ? Changing or stopping your regular medicines. Some medicines can cause abnormal test results, such as digitalis and tetracycline. ? Avoiding douching or taking a bath the day before or the day of the test. Tell a health care provider about:  Any allergies you have.  All medicines you are taking, including vitamins, herbs, eye drops, creams, and over-the-counter medicines.  Any blood disorders you have.  Any surgeries you have had.  Any  medical conditions you have.  Whether you are pregnant or may be pregnant. How are the results reported? Your test results will be reported as either abnormal or normal. A false-positive result can occur. A false positive is incorrect because it means that a condition is present when it is not. A false-negative result can occur. A false negative is incorrect because it means that a condition is not present when it is. What do the results mean? A normal test result means that you do not have signs of cancer of the vagina, cervix, or uterus. An abnormal result may mean that you have:  Cancer. A Pap test by itself is not enough to diagnose cancer. You will have more tests done in this case.  Precancerous changes in your vagina, cervix, or uterus.  Inflammation of the cervix.  An STD (sexually transmitted disease).  A fungal infection.  A parasite infection. Talk with your health care provider about what your results mean. Questions to ask your health care provider Ask your health care provider, or the department that is doing the test:  When will my results be ready?  How will I get my results?  What are my treatment options?  What other tests do I need?  What are my next steps? Summary  In general, women should have a Pap test every 3 years until they reach menopause or age 40.  Your health care provider will collect a   a sample of cells from the surface of your cervix. This will be done using a small cotton swab, plastic spatula, or brush.  In some cases, fluids (secretions) from the cervix or vagina may also be collected. This information is not intended to replace advice given to you by your health care provider. Make sure you discuss any questions you have with your health care provider. Document Released: 11/17/2002 Document Revised: 05/06/2017 Document Reviewed: 05/06/2017 Elsevier Interactive Patient Education  2019 [employer] Maintenance, Female Adopting  a healthy lifestyle and getting preventive care can go a long way to promote health and wellness. Talk with your health care provider about what schedule of regular examinations is right for you. This is a good chance for you to check in with your provider about disease prevention and staying healthy. In between checkups, there are plenty of things you can do on your own. Experts have done a lot of research about which lifestyle changes and preventive measures are most likely to keep you healthy. Ask your health care provider for more information. Weight and diet Eat a healthy diet  Be sure to include plenty of vegetables, fruits, low-fat dairy products, and lean protein.  Do not eat a lot of foods high in solid fats, added sugars, or salt.  Get regular exercise. This is one of the most important things you can do for your health. ? Most adults should exercise for at least 150 minutes each week. The exercise should increase your heart rate and make you sweat (moderate-intensity exercise). ? Most adults should also do strengthening exercises at least twice a week. This is in addition to the moderate-intensity exercise. Maintain a healthy weight  Body mass index (BMI) is a measurement that can be used to identify possible weight problems. It estimates body fat based on height and weight. Your health care provider can help determine your BMI and help you achieve or maintain a healthy weight.  For females 98 years of age and older: ? A BMI below 18.5 is considered underweight. ? A BMI of 18.5 to 24.9 is normal. ? A BMI of 25 to 29.9 is considered overweight. ? A BMI of 30 and above is considered obese. Watch levels of cholesterol and blood lipids  You should start having your blood tested for lipids and cholesterol at 40 years of age, then have this test every 5 years.  You may need to have your cholesterol levels checked more often if: ? Your lipid or cholesterol levels are high. ? You are  older than 40 years of age. ? You are at high risk for heart disease. Cancer screening Lung Cancer  Lung cancer screening is recommended for adults 61-92 years old who are at high risk for lung cancer because of a history of smoking.  A yearly low-dose CT scan of the lungs is recommended for people who: ? Currently smoke. ? Have quit within the past 15 years. ? Have at least a 30-pack-year history of smoking. A pack year is smoking an average of one pack of cigarettes a day for 1 year.  Yearly screening should continue until it has been 15 years since you quit.  Yearly screening should stop if you develop a health problem that would prevent you from having lung cancer treatment. Breast Cancer  Practice breast self-awareness. This means understanding how your breasts normally appear and feel.  It also means doing regular breast self-exams. Let your health care provider know about any changes, no  matter how small.  If you are in your 20s or 30s, you should have a clinical breast exam (CBE) by a health care provider every 1-3 years as part of a regular health exam.  If you are 24 or older, have a CBE every year. Also consider having a breast X-ray (mammogram) every year.  If you have a family history of breast cancer, talk to your health care provider about genetic screening.  If you are at high risk for breast cancer, talk to your health care provider about having an MRI and a mammogram every year.  Breast cancer gene (BRCA) assessment is recommended for women who have family members with BRCA-related cancers. BRCA-related cancers include: ? Breast. ? Ovarian. ? Tubal. ? Peritoneal cancers.  Results of the assessment will determine the need for genetic counseling and BRCA1 and BRCA2 testing. Cervical Cancer Your health care provider may recommend that you be screened regularly for cancer of the pelvic organs (ovaries, uterus, and vagina). This screening involves a pelvic  examination, including checking for microscopic changes to the surface of your cervix (Pap test). You may be encouraged to have this screening done every 3 years, beginning at age 109.  For women ages 55-65, health care providers may recommend pelvic exams and Pap testing every 3 years, or they may recommend the Pap and pelvic exam, combined with testing for human papilloma virus (HPV), every 5 years. Some types of HPV increase your risk of cervical cancer. Testing for HPV may also be done on women of any age with unclear Pap test results.  Other health care providers may not recommend any screening for nonpregnant women who are considered low risk for pelvic cancer and who do not have symptoms. Ask your health care provider if a screening pelvic exam is right for you.  If you have had past treatment for cervical cancer or a condition that could lead to cancer, you need Pap tests and screening for cancer for at least 20 years after your treatment. If Pap tests have been discontinued, your risk factors (such as having a new sexual partner) need to be reassessed to determine if screening should resume. Some women have medical problems that increase the chance of getting cervical cancer. In these cases, your health care provider may recommend more frequent screening and Pap tests. Colorectal Cancer  This type of cancer can be detected and often prevented.  Routine colorectal cancer screening usually begins at 40 years of age and continues through 40 years of age.  Your health care provider may recommend screening at an earlier age if you have risk factors for colon cancer.  Your health care provider may also recommend using home test kits to check for hidden blood in the stool.  A small camera at the end of a tube can be used to examine your colon directly (sigmoidoscopy or colonoscopy). This is done to check for the earliest forms of colorectal cancer.  Routine screening usually begins at age  33.  Direct examination of the colon should be repeated every 5-10 years through 40 years of age. However, you may need to be screened more often if early forms of precancerous polyps or small growths are found. Skin Cancer  Check your skin from head to toe regularly.  Tell your health care provider about any new moles or changes in moles, especially if there is a change in a mole's shape or color.  Also tell your health care provider if you have a mole that  is larger than the size of a pencil eraser.  Always use sunscreen. Apply sunscreen liberally and repeatedly throughout the day.  Protect yourself by wearing long sleeves, pants, a wide-brimmed hat, and sunglasses whenever you are outside. Heart disease, diabetes, and high blood pressure  High blood pressure causes heart disease and increases the risk of stroke. High blood pressure is more likely to develop in: ? People who have blood pressure in the high end of the normal range (130-139/85-89 mm Hg). ? People who are overweight or obese. ? People who are African American.  If you are 49-14 years of age, have your blood pressure checked every 3-5 years. If you are 1 years of age or older, have your blood pressure checked every year. You should have your blood pressure measured twice-once when you are at a hospital or clinic, and once when you are not at a hospital or clinic. Record the average of the two measurements. To check your blood pressure when you are not at a hospital or clinic, you can use: ? An automated blood pressure machine at a pharmacy. ? A home blood pressure monitor.  If you are between 80 years and 57 years old, ask your health care provider if you should take aspirin to prevent strokes.  Have regular diabetes screenings. This involves taking a blood sample to check your fasting blood sugar level. ? If you are at a normal weight and have a low risk for diabetes, have this test once every three years after 40 years  of age. ? If you are overweight and have a high risk for diabetes, consider being tested at a younger age or more often. Preventing infection Hepatitis B  If you have a higher risk for hepatitis B, you should be screened for this virus. You are considered at high risk for hepatitis B if: ? You were born in a country where hepatitis B is common. Ask your health care provider which countries are considered high risk. ? Your parents were born in a high-risk country, and you have not been immunized against hepatitis B (hepatitis B vaccine). ? You have HIV or AIDS. ? You use needles to inject street drugs. ? You live with someone who has hepatitis B. ? You have had sex with someone who has hepatitis B. ? You get hemodialysis treatment. ? You take certain medicines for conditions, including cancer, organ transplantation, and autoimmune conditions. Hepatitis C  Blood testing is recommended for: ? Everyone born from [birth year] through 1967. ? Anyone with known risk factors for hepatitis C. Sexually transmitted infections (STIs)  You should be screened for sexually transmitted infections (STIs) including gonorrhea and chlamydia if: ? You are sexually active and are younger than 40 years of age. ? You are older than 40 years of age and your health care provider tells you that you are at risk for this type of infection. ? Your sexual activity has changed since you were last screened and you are at an increased risk for chlamydia or gonorrhea. Ask your health care provider if you are at risk.  If you do not have HIV, but are at risk, it may be recommended that you take a prescription medicine daily to prevent HIV infection. This is called pre-exposure prophylaxis (PrEP). You are considered at risk if: ? You are sexually active and do not regularly use condoms or know the HIV status of your partner(s). ? You take drugs by injection. ? You are sexually active with a partner  who has HIV. Talk with your  health care provider about whether you are at high risk of being infected with HIV. If you choose to begin PrEP, you should first be tested for HIV. You should then be tested every 3 months for as long as you are taking PrEP. Pregnancy  If you are premenopausal and you may become pregnant, ask your health care provider about preconception counseling.  If you may become pregnant, take 400 to 800 micrograms (mcg) of folic acid every day.  If you want to prevent pregnancy, talk to your health care provider about birth control (contraception). Osteoporosis and menopause  Osteoporosis is a disease in which the bones lose minerals and strength with aging. This can result in serious bone fractures. Your risk for osteoporosis can be identified using a bone density scan.  If you are 37 years of age or older, or if you are at risk for osteoporosis and fractures, ask your health care provider if you should be screened.  Ask your health care provider whether you should take a calcium or vitamin D supplement to lower your risk for osteoporosis.  Menopause may have certain physical symptoms and risks.  Hormone replacement therapy may reduce some of these symptoms and risks. Talk to your health care provider about whether hormone replacement therapy is right for you. Follow these instructions at home:  Schedule regular health, dental, and eye exams.  Stay current with your immunizations.  Do not use any tobacco products including cigarettes, chewing tobacco, or electronic cigarettes.  If you are pregnant, do not drink alcohol.  If you are breastfeeding, limit how much and how often you drink alcohol.  Limit alcohol intake to no more than 1 drink per day for nonpregnant women. One drink equals 12 ounces of beer, 5 ounces of wine, or 1 ounces of hard liquor.  Do not use street drugs.  Do not share needles.  Ask your health care provider for help if you need support or information about quitting  drugs.  Tell your health care provider if you often feel depressed.  Tell your health care provider if you have ever been abused or do not feel safe at home. This information is not intended to replace advice given to you by your health care provider. Make sure you discuss any questions you have with your health care provider. Document Released: 03/12/2011 Document Revised: 02/02/2016 Document Reviewed: 05/31/2015 Elsevier Interactive Patient Education  2019 [employer].

## 2018-10-03 ENCOUNTER — Telehealth: Payer: Self-pay

## 2018-10-03 ENCOUNTER — Encounter (HOSPITAL_COMMUNITY): Payer: Self-pay

## 2018-10-03 ENCOUNTER — Other Ambulatory Visit: Payer: Self-pay

## 2018-10-03 ENCOUNTER — Emergency Department (HOSPITAL_COMMUNITY)
Admission: EM | Admit: 2018-10-03 | Discharge: 2018-10-03 | Disposition: A | Discharge status: Home / Self Care | Payer: Medicaid Other | Attending: Emergency Medicine | Admitting: Emergency Medicine

## 2018-10-03 DIAGNOSIS — R5383 Other fatigue: Secondary | ICD-10-CM

## 2018-10-03 DIAGNOSIS — F1010 Alcohol abuse, uncomplicated: Principal | ICD-10-CM

## 2018-10-03 DIAGNOSIS — I10 Essential (primary) hypertension: Secondary | ICD-10-CM

## 2018-10-03 DIAGNOSIS — F1099 Alcohol use, unspecified with unspecified alcohol-induced disorder: Secondary | ICD-10-CM | POA: Diagnosis present

## 2018-10-03 LAB — URINALYSIS, ROUTINE W REFLEX MICROSCOPIC
Bilirubin Urine: NEGATIVE
Glucose, UA: NEGATIVE mg/dL
Hgb urine dipstick: NEGATIVE
Ketones, ur: NEGATIVE mg/dL
Nitrite: NEGATIVE
PH: 6 (ref 5.0–8.0)
Protein, ur: NEGATIVE mg/dL
Specific Gravity, Urine: 1.004 — ABNORMAL LOW (ref 1.005–1.030)

## 2018-10-03 LAB — CBC WITH DIFFERENTIAL/PLATELET
Basophils Absolute: 0 10*3/uL (ref 0.0–0.2)
Basos: 0 %
EOS (ABSOLUTE): 0 10*3/uL (ref 0.0–0.4)
Eos: 1 %
Hematocrit: 33.2 % — ABNORMAL LOW (ref 34.0–46.6)
Hemoglobin: 11.5 g/dL (ref 11.1–15.9)
Immature Grans (Abs): 0 10*3/uL (ref 0.0–0.1)
Immature Granulocytes: 0 %
LYMPHS ABS: 1.2 10*3/uL (ref 0.7–3.1)
Lymphs: 30 %
MCH: 33.3 pg — AB (ref 26.6–33.0)
MCHC: 34.6 g/dL (ref 31.5–35.7)
MCV: 96 fL (ref 79–97)
Monocytes Absolute: 0.3 10*3/uL (ref 0.1–0.9)
Monocytes: 7 %
Neutrophils Absolute: 2.4 10*3/uL (ref 1.4–7.0)
Neutrophils: 62 %
Platelets: 98 10*3/uL — CL (ref 150–450)
RBC: 3.45 x10E6/uL — ABNORMAL LOW (ref 3.77–5.28)
RDW: 14 % (ref 11.7–15.4)
WBC: 3.8 10*3/uL (ref 3.4–10.8)

## 2018-10-03 LAB — COMPREHENSIVE METABOLIC PANEL
A/G RATIO: 1.1 — AB (ref 1.2–2.2)
ALT: 77 IU/L — ABNORMAL HIGH (ref 0–32)
ALT: 73 U/L — ABNORMAL HIGH (ref 0–44)
ANION GAP: 17 — AB (ref 5–15)
AST: 541 IU/L (ref 0–40)
AST: 337 U/L — ABNORMAL HIGH (ref 15–41)
Albumin: 4 g/dL (ref 3.8–4.8)
Albumin: 3.9 g/dL (ref 3.5–5.0)
Alkaline Phosphatase: 268 IU/L — ABNORMAL HIGH (ref 39–117)
Alkaline Phosphatase: 211 U/L — ABNORMAL HIGH (ref 38–126)
BILIRUBIN TOTAL: 0.8 mg/dL (ref 0.0–1.2)
BUN/Creatinine Ratio: 19 (ref 9–23)
BUN: 10 mg/dL (ref 6–20)
BUN: 7 mg/dL (ref 6–20)
CO2: 19 mmol/L — ABNORMAL LOW (ref 20–29)
CO2: 19 mmol/L — ABNORMAL LOW (ref 22–32)
Calcium: 9.2 mg/dL (ref 8.7–10.2)
Calcium: 8.7 mg/dL — ABNORMAL LOW (ref 8.9–10.3)
Chloride: 95 mmol/L — ABNORMAL LOW (ref 96–106)
Chloride: 100 mmol/L (ref 98–111)
Creatinine, Ser: 0.53 mg/dL — ABNORMAL LOW (ref 0.57–1.00)
Creatinine, Ser: 0.59 mg/dL (ref 0.44–1.00)
GFR calc Af Amer: >60 mL/min (ref >60)
GFR calc non Af Amer: 120 mL/min/{1.73_m2} (ref >59)
GFR, EST AFRICAN AMERICAN: 138 mL/min/{1.73_m2} (ref >59)
Globulin, Total: 3.8 g/dL (ref 1.5–4.5)
Glucose, Bld: 114 mg/dL — ABNORMAL HIGH (ref 70–99)
Glucose: 77 mg/dL (ref 65–99)
Potassium: 3.8 mmol/L (ref 3.5–5.2)
Potassium: 3.3 mmol/L — ABNORMAL LOW (ref 3.5–5.1)
Sodium: 136 mmol/L (ref 134–144)
Sodium: 136 mmol/L (ref 135–145)
TOTAL PROTEIN: 7.8 g/dL (ref 6.0–8.5)
Total Bilirubin: 0.9 mg/dL (ref 0.3–1.2)
Total Protein: 8.1 g/dL (ref 6.5–8.1)

## 2018-10-03 LAB — IRON,TIBC AND FERRITIN PANEL
Ferritin: 423 ng/mL — ABNORMAL HIGH (ref 15–150)
Iron Saturation: 59 % — ABNORMAL HIGH (ref 15–55)
Iron: 168 ug/dL — ABNORMAL HIGH (ref 27–159)
Total Iron Binding Capacity: 284 ug/dL (ref 250–450)
UIBC: 116 ug/dL — ABNORMAL LOW (ref 131–425)

## 2018-10-03 LAB — CBC
HCT: 34 % — ABNORMAL LOW (ref 36.0–46.0)
Hemoglobin: 10.9 g/dL — ABNORMAL LOW (ref 12.0–15.0)
MCH: 32.6 pg (ref 26.0–34.0)
MCHC: 32.1 g/dL (ref 30.0–36.0)
MCV: 101.8 fL — ABNORMAL HIGH (ref 80.0–100.0)
Platelets: 188 10*3/uL (ref 150–400)
RBC: 3.34 MIL/uL — ABNORMAL LOW (ref 3.87–5.11)
RDW: 14.7 % (ref 11.5–15.5)
WBC: 5.3 10*3/uL (ref 4.0–10.5)
nRBC: 0 % (ref 0.0–0.2)

## 2018-10-03 LAB — VITAMIN B1: Thiamine: 74.6 nmol/L (ref 66.5–200.0)

## 2018-10-03 LAB — HEPATITIS C VRS RNA DETECT BY PCR-QUAL

## 2018-10-03 LAB — HEMOGLOBIN A1C
Est. average glucose Bld gHb Est-mCnc: 91 mg/dL
Hgb A1c MFr Bld: 4.8 % (ref 4.8–5.6)

## 2018-10-03 LAB — I-STAT BETA HCG BLOOD, ED (MC, WL, AP ONLY): I-stat hCG, quantitative: <5 m[IU]/mL (ref <5)

## 2018-10-03 LAB — LIPASE, BLOOD: LIPASE: 330 U/L — AB (ref 11–51)

## 2018-10-03 MED ORDER — SODIUM CHLORIDE 0.9% FLUSH: 3.0000 mL | Freq: Once | INTRAVENOUS | Status: DC

## 2018-10-03 NOTE — ED Provider Notes (Signed)
[facility] EMERGENCY DEPARTMENT Provider Note   CSN: [id number] Arrival date & time: 10/03/18  1853     History   Chief Complaint Chief Complaint  Patient presents with  . Abnormal Lab    HPI Joann [name] is a 38 y.o. female.   Abnormal Lab  Time since result:  LFT Patient referred by:  PCP Resulting agency:  Internal Result type: LFT/amylase/lipase   LFT / amylase / lipase:    Alkaline phosphatase:  High   ALT (SGPT):  High   AST (SGOT):  High   Total bilirubin:  High   Past Medical History:  Diagnosis Date  . Alcohol abuse   . Anxiety   . Hypertension     Patient Active Problem List   Diagnosis Date Noted  . Alcohol-induced acute pancreatitis   . Alcoholic intoxication without complication ([place])   . Acute blood loss anemia   . Alcoholic ketoacidosis 67/08/4579  . Alcohol abuse with alcohol-induced mood disorder ([place]) 09/14/2017    Past Surgical History:  Procedure Laterality Date  . ECTOPIC PREGNANCY SURGERY       OB History   No obstetric history on file.      Home Medications    Prior to Admission medications   Medication Sig Start Date End Date Taking? Authorizing Provider  pantoprazole (PROTONIX) 40 MG tablet Take 1 tablet (40 mg total) by mouth at bedtime. 09/29/18   [name], FNP    Family History Family History  Problem Relation Age of Onset  . Healthy Mother   . Alcohol abuse Father   . Diabetes Father   . Healthy Brother   . Hypertension Maternal Grandmother   . Hypertension Maternal Grandfather   . Healthy Brother   . Healthy Brother     Social History Social History   Tobacco Use  . Smoking status: Never Smoker  . Smokeless tobacco: Never Used  Substance Use Topics  . Alcohol use: Yes    Comment: "I just do it"  . Drug use: Not on file     Allergies   Patient has no known allergies.   Review of Systems Review of Systems  Constitutional: Positive for fatigue. Negative for chills,  diaphoresis and fever.  HENT: Negative for congestion, ear pain and sore throat.   Eyes: Negative for pain and visual disturbance.  Respiratory: Negative for cough, chest tightness and shortness of breath.   Cardiovascular: Negative for chest pain and palpitations.  Gastrointestinal: Negative for abdominal pain, constipation, diarrhea, nausea and vomiting.  Genitourinary: Negative for dysuria, flank pain, frequency and hematuria.  Musculoskeletal: Negative for arthralgias, back pain, neck pain and neck stiffness.  Skin: Negative for color change and rash.  Neurological: Negative for dizziness, seizures, syncope, speech difficulty, light-headedness and headaches.  Psychiatric/Behavioral: Negative for agitation and confusion.  All other systems reviewed and are negative.    Physical Exam Updated Vital Signs BP 92/68   Pulse 90   Temp 97.8 F (36.6 C)   Resp 16   Ht 5' (1.524 m)   Wt 68 kg   SpO2 99%   BMI 29.28 kg/m   Physical Exam Vitals signs and nursing note reviewed.  Constitutional:      General: She is not in acute distress.    Appearance: She is well-developed. She is not ill-appearing, toxic-appearing or diaphoretic.  HENT:     Head: Normocephalic and atraumatic.     Nose: No congestion or rhinorrhea.     Mouth/Throat:  Pharynx: No oropharyngeal exudate or posterior oropharyngeal erythema.  Eyes:     Conjunctiva/sclera: Conjunctivae normal.  Neck:     Musculoskeletal: Neck supple.  Cardiovascular:     Rate and Rhythm: Normal rate and regular rhythm.     Pulses: Normal pulses.     Heart sounds: No murmur.  Pulmonary:     Effort: Pulmonary effort is normal. No respiratory distress.     Breath sounds: Normal breath sounds. No wheezing, rhonchi or rales.  Chest:     Chest wall: No tenderness.  Abdominal:     General: There is no distension.     Palpations: Abdomen is soft.     Tenderness: There is no abdominal tenderness.  Musculoskeletal:        General:  No tenderness.     Right lower leg: No edema.     Left lower leg: No edema.  Skin:    General: Skin is warm and dry.     Capillary Refill: Capillary refill takes less than 2 seconds.     Coloration: Skin is not jaundiced.     Findings: No bruising.  Neurological:     General: No focal deficit present.     Mental Status: She is alert and oriented to person, place, and time.     Cranial Nerves: No cranial nerve deficit.     Sensory: No sensory deficit.     Motor: No weakness.     Coordination: Coordination normal.     Gait: Gait normal.  Psychiatric:        Mood and Affect: Mood normal.      ED Treatments / Results  Labs (all labs ordered are listed, but only abnormal results are displayed) Labs Reviewed  LIPASE, BLOOD - Abnormal; Notable for the following components:      Result Value   Lipase 330 (*)    All other components within normal limits  COMPREHENSIVE METABOLIC PANEL - Abnormal; Notable for the following components:   Potassium 3.3 (*)    CO2 19 (*)    Glucose, Bld 114 (*)    Calcium 8.7 (*)    AST 337 (*)    ALT 73 (*)    Alkaline Phosphatase 211 (*)    Anion gap 17 (*)    All other components within normal limits  CBC - Abnormal; Notable for the following components:   RBC 3.34 (*)    Hemoglobin 10.9 (*)    HCT 34.0 (*)    MCV 101.8 (*)    All other components within normal limits  URINALYSIS, ROUTINE W REFLEX MICROSCOPIC - Abnormal; Notable for the following components:   Specific Gravity, Urine 1.004 (*)    Leukocytes, UA TRACE (*)    Bacteria, UA MANY (*)    All other components within normal limits  I-STAT BETA HCG BLOOD, ED ([facility], [facility], [facility] ONLY)    EKG None  Radiology No results found.  Procedures Procedures (including critical care time)  Medications Ordered in ED Medications  sodium chloride flush (NS) 0.9 % injection 3 mL (has no administration in time range)     Initial Impression / Assessment and Plan / ED Course  I have reviewed  the triage vital signs and the nursing notes.  Pertinent labs & imaging results that were available during my care of the patient were reviewed by me and considered in my medical decision making (see chart for details).     Joann [name] is a 38 y.o. female with a  past medical history significant for hypertension, anxiety, recent pancreatitis and alcohol abuse who presents at the direction of her PCP for evaluation of possible liver injury and alcohol abuse.  Patient reports that she was admitted last month for pancreatitis and her abdominal pain has improved since then.  She reports that she is continued to drink heavily and has actually been increasing her drinking.  She reports that she was drinking several pints of liquor a day as well as beer.  She says that she had lab work done several days ago and her PCP called her today telling her that she needed to come in for worsened liver function.  Patient says that she has not had nausea, vomiting, or worsened abdominal pain.  She has not had fevers, chills, chest pain, shortness breath, congestion, or cough.  She denies any urinary symptoms or new GI symptoms.  She denies any bloody emesis or bloody bowel movements.  She denies other complaints.  She reports that she is going to continue drinking and is otherwise feeling normal other than mild fatigue.  On exam, lungs are clear and chest is nontender.  No focal neurologic deficits.  Patient does smell of alcohol but otherwise exam is unremarkable.  Abdomen is nontender.  Back is nontender.  No murmur appreciated.  No trauma seen.  Normal gait.  No asterixis.  No jaundice.  Patient's laboratory testing actually looked improved from prior.  Bilirubin is now normal as it was over 5 during her prior admission.  Lipase was over 2000 and is now 330.  CBC shows no leukocytosis and improved hemoglobin.  Mild hypokalemia of 3.3.  Suspect mild dehydration.  Urinalysis was not consistent with infection given lack of  nitrites or urinary symptoms.  Patient has slight elevation in her anion gap however she does not appear to have DKA.    Given patient's improving lab work, we do not feel she needs emergent consultation with gastroenterology or admission at this time.  Patient was able to eat and drink without any difficulty and did not appear clinically intoxicated.  On my reassessment, patient had normal gait and had no focal neurologic deficits again.  Given for symptoms, patient was felt stable for discharge home.  Patient will follow-up with PCP for further liver monitoring and will try to cut back on her drinking.  Patient voiced understanding of the plan of care as well as return precautions.  Patient and other questions or concerns and was discharged in good condition.   Final Clinical Impressions(s) / ED Diagnoses   Final diagnoses:  Fatigue, unspecified type  Alcohol abuse    ED Discharge Orders    None     Clinical Impression: 1. Fatigue, unspecified type   2. Alcohol abuse     Disposition: Discharge  Condition: Good  I have discussed the results, Dx and Tx plan with the pt(& family if present). He/she/they expressed understanding and agree(s) with the plan. Discharge instructions discussed at great length. Strict return precautions discussed and pt &/or family have verbalized understanding of the instructions. No further questions at time of discharge.    New Prescriptions   No medications on file    Follow Up: [name], FNP [address] [place] [address] [place] [address] [id number] mc [place]       Kristian Mogg, Gwenyth Allegra, MD 10/03/18 [phone]

## 2018-10-03 NOTE — Telephone Encounter (Signed)
Please contact patient to advise her liver enzymes require immediate follow-up at the ER. We received critical lab result notification that her AST which is an enzymes produced by her liver 541 and ALT 77. She should go to the ER for further work-up and evaluation.

## 2018-10-03 NOTE — Telephone Encounter (Signed)
Called [facility] to follow up on critical labs that just resulted into your inbox this morning. Spoke with [employer]. She states that all of the labs were resulted on 09/30/2018 except for the Vitamin B1. She states that the account is set up to only get final results & not partials. This is something that will have to be set up with the account manager. I inquired why the office didn't receive a phone call since patient had multiple critical values & she said that wasn't set up.

## 2018-10-03 NOTE — Discharge Instructions (Signed)
Your laboratory testing today showed improving liver function compared to several days ago.  As your only symptom today was fatigue, given your improving labs and reassuring vital signs, we feel you are safe for discharge home.  Please try and cut back on your drinking as this is further hurting your liver.  Please avoid using medications like Tylenol as this could also hurt your liver.  Please stay hydrated as we suspect your slightly dehydrated tonight.  Please rest and follow-up with your primary doctor.  If any symptoms change or worsen, please return to the nearest emergency department.

## 2018-10-03 NOTE — Telephone Encounter (Signed)
Called patient & spoke with her about her CMP results & recommendations. Expressed understanding. States that she will go to the ER today.

## 2018-10-03 NOTE — ED Notes (Signed)
Pt provided with ice water

## 2018-10-03 NOTE — Telephone Encounter (Signed)
Left voice mail to call back 

## 2018-10-03 NOTE — ED Notes (Signed)
Pt given turkey sandwich

## 2018-10-03 NOTE — ED Triage Notes (Signed)
Pt here after PCP called about elevated liver enzymes.  Pt appears to be intoxicated and needs help walking.  A&Ox4. Complaining of generalized abdomnial pain.

## 2018-10-06 ENCOUNTER — Telehealth: Payer: Self-pay | Admitting: Family Medicine

## 2018-10-06 NOTE — Telephone Encounter (Signed)
Please advise 

## 2018-10-06 NOTE — Telephone Encounter (Signed)
Left voicemail to call back.  Checked Jasmine's schedule & she doesn't have any openings tomorrow.

## 2018-10-06 NOTE — Telephone Encounter (Signed)
Patient was advised during her last office visit, that our office is for primary care and she will need to see treatment at a rehabilitation facility.  I recommended alcohol rehabilitation. She may contact the following resources:   Inpatient [facility]  Address: [address], [place], [place] [address]  Phone: [phone] Website: [email or web address]  [facility]  Address: [place], [place], [place] [address]  Phone: [phone]   [facility] [phone] [place]   She is scheduled to meet with [facility] also, however if she is wanting assistance with obtaining placement in a treatment facility, she can schedule an appointment with [facility] tomorrow.

## 2018-10-06 NOTE — Telephone Encounter (Signed)
Patient called stating that she would like to be placed on a medication for alcohol withdrawal, please follow up.

## 2018-10-09 NOTE — Telephone Encounter (Signed)
Left voice mail to call back 

## 2018-10-17 ENCOUNTER — Emergency Department (HOSPITAL_COMMUNITY)
Admission: EM | Admit: 2018-10-17 | Discharge: 2018-10-17 | Discharge status: Against Medical Advice | Payer: Medicaid Other
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
## 2018-10-21 ENCOUNTER — Ambulatory Visit: Payer: Self-pay | Admitting: Family Medicine

## 2018-10-21 ENCOUNTER — Institutional Professional Consult (permissible substitution): Payer: Self-pay | Admitting: Licensed Clinical Social Worker

## 2018-10-24 ENCOUNTER — Inpatient Hospital Stay (HOSPITAL_COMMUNITY)
Admission: EM | Admit: 2018-10-24 | Discharge: 2018-10-26 | DRG: 439 | Disposition: A | Discharge status: Home / Self Care | Payer: Medicaid Other | Attending: Internal Medicine | Admitting: Internal Medicine

## 2018-10-24 ENCOUNTER — Encounter (HOSPITAL_COMMUNITY): Payer: Self-pay | Admitting: Internal Medicine

## 2018-10-24 ENCOUNTER — Other Ambulatory Visit: Payer: Self-pay

## 2018-10-24 DIAGNOSIS — Z811 Family history of alcohol abuse and dependence: Secondary | ICD-10-CM

## 2018-10-24 DIAGNOSIS — K709 Alcoholic liver disease, unspecified: Secondary | ICD-10-CM

## 2018-10-24 DIAGNOSIS — I10 Essential (primary) hypertension: Secondary | ICD-10-CM

## 2018-10-24 DIAGNOSIS — Z833 Family history of diabetes mellitus: Secondary | ICD-10-CM

## 2018-10-24 DIAGNOSIS — Y906 Blood alcohol level of 120-199 mg/100 ml: Secondary | ICD-10-CM

## 2018-10-24 DIAGNOSIS — Z8249 Family history of ischemic heart disease and other diseases of the circulatory system: Secondary | ICD-10-CM

## 2018-10-24 DIAGNOSIS — F10239 Alcohol dependence with withdrawal, unspecified: Secondary | ICD-10-CM

## 2018-10-24 DIAGNOSIS — D61818 Other pancytopenia: Secondary | ICD-10-CM

## 2018-10-24 DIAGNOSIS — K7040 Alcoholic hepatic failure without coma: Secondary | ICD-10-CM

## 2018-10-24 DIAGNOSIS — F10288 Alcohol dependence with other alcohol-induced disorder: Secondary | ICD-10-CM

## 2018-10-24 DIAGNOSIS — R945 Abnormal results of liver function studies: Secondary | ICD-10-CM

## 2018-10-24 DIAGNOSIS — Z91410 Personal history of adult physical and sexual abuse: Secondary | ICD-10-CM

## 2018-10-24 DIAGNOSIS — R569 Unspecified convulsions: Secondary | ICD-10-CM

## 2018-10-24 DIAGNOSIS — D696 Thrombocytopenia, unspecified: Secondary | ICD-10-CM

## 2018-10-24 DIAGNOSIS — K852 Alcohol induced acute pancreatitis without necrosis or infection: Principal | ICD-10-CM

## 2018-10-24 DIAGNOSIS — R7989 Other specified abnormal findings of blood chemistry: Secondary | ICD-10-CM

## 2018-10-24 DIAGNOSIS — F10939 Alcohol use, unspecified with withdrawal, unspecified: Secondary | ICD-10-CM | POA: Diagnosis present

## 2018-10-24 HISTORY — DX: Other specified abnormal findings of blood chemistry: R79.89

## 2018-10-24 LAB — COMPREHENSIVE METABOLIC PANEL
ALT: 116 U/L — ABNORMAL HIGH (ref 0–44)
AST: 998 U/L — AB (ref 15–41)
Albumin: 3.6 g/dL (ref 3.5–5.0)
Alkaline Phosphatase: 334 U/L — ABNORMAL HIGH (ref 38–126)
Anion gap: 20 — ABNORMAL HIGH (ref 5–15)
BUN: 8 mg/dL (ref 6–20)
CO2: 19 mmol/L — ABNORMAL LOW (ref 22–32)
Calcium: 8.8 mg/dL — ABNORMAL LOW (ref 8.9–10.3)
Chloride: 99 mmol/L (ref 98–111)
Creatinine, Ser: 0.61 mg/dL (ref 0.44–1.00)
GFR calc Af Amer: >60 mL/min (ref >60)
GFR calc non Af Amer: >60 mL/min (ref >60)
GLUCOSE: 118 mg/dL — AB (ref 70–99)
Potassium: 3.9 mmol/L (ref 3.5–5.1)
Sodium: 138 mmol/L (ref 135–145)
Total Bilirubin: 2.3 mg/dL — ABNORMAL HIGH (ref 0.3–1.2)
Total Protein: 7.8 g/dL (ref 6.5–8.1)

## 2018-10-24 LAB — URINALYSIS, ROUTINE W REFLEX MICROSCOPIC
Bilirubin Urine: NEGATIVE
GLUCOSE, UA: NEGATIVE mg/dL
Hgb urine dipstick: NEGATIVE
Ketones, ur: 20 mg/dL — AB
LEUKOCYTE UA: NEGATIVE
NITRITE: NEGATIVE
PH: 6 (ref 5.0–8.0)
Protein, ur: NEGATIVE mg/dL
Specific Gravity, Urine: 1.017 (ref 1.005–1.030)

## 2018-10-24 LAB — CBC WITH DIFFERENTIAL/PLATELET
Abs Immature Granulocytes: 0.01 10*3/uL (ref 0.00–0.07)
Basophils Absolute: 0 10*3/uL (ref 0.0–0.1)
Basophils Relative: 0 %
Eosinophils Absolute: 0 10*3/uL (ref 0.0–0.5)
Eosinophils Relative: 0 %
HCT: 34 % — ABNORMAL LOW (ref 36.0–46.0)
Hemoglobin: 11 g/dL — ABNORMAL LOW (ref 12.0–15.0)
Immature Granulocytes: 0 %
Lymphocytes Relative: 24 %
Lymphs Abs: 0.8 10*3/uL (ref 0.7–4.0)
MCH: 32.9 pg (ref 26.0–34.0)
MCHC: 32.4 g/dL (ref 30.0–36.0)
MCV: 101.8 fL — ABNORMAL HIGH (ref 80.0–100.0)
MONO ABS: 0.3 10*3/uL (ref 0.1–1.0)
Monocytes Relative: 9 %
Neutro Abs: 2.1 10*3/uL (ref 1.7–7.7)
Neutrophils Relative %: 67 %
PLATELETS: 80 10*3/uL — AB (ref 150–400)
RBC: 3.34 MIL/uL — ABNORMAL LOW (ref 3.87–5.11)
RDW: 15.5 % (ref 11.5–15.5)
WBC: 3.1 10*3/uL — ABNORMAL LOW (ref 4.0–10.5)
nRBC: 0 % (ref 0.0–0.2)

## 2018-10-24 LAB — PREGNANCY, URINE: Preg Test, Ur: NEGATIVE

## 2018-10-24 LAB — RAPID URINE DRUG SCREEN, HOSP PERFORMED
Amphetamines: NOT DETECTED
Barbiturates: NOT DETECTED
Benzodiazepines: NOT DETECTED
COCAINE: NOT DETECTED
Opiates: NOT DETECTED
Tetrahydrocannabinol: NOT DETECTED

## 2018-10-24 LAB — ETHANOL: Alcohol, Ethyl (B): 189 mg/dL — ABNORMAL HIGH (ref <10)

## 2018-10-24 LAB — CBG MONITORING, ED: Glucose-Capillary: 168 mg/dL — ABNORMAL HIGH (ref 70–99)

## 2018-10-24 LAB — LIPASE, BLOOD: Lipase: 212 U/L — ABNORMAL HIGH (ref 11–51)

## 2018-10-24 MED ORDER — MAGNESIUM SULFATE 2 GM/50ML IV SOLN
2.0000 g | INTRAVENOUS | Status: AC
  Administered 2018-10-24: 2 g via INTRAVENOUS
  Filled 2018-10-24: qty 50

## 2018-10-24 MED ORDER — LORAZEPAM 1 MG PO TABS: 1.0000 mg | ORAL_TABLET | Freq: Four times a day (QID) | ORAL | Status: DC | PRN

## 2018-10-24 MED ORDER — VITAMIN B-1 100 MG PO TABS
100.0000 mg | ORAL_TABLET | Freq: Once | ORAL | Status: AC
  Administered 2018-10-24: 100 mg via ORAL
  Filled 2018-10-24: qty 1

## 2018-10-24 MED ORDER — SODIUM CHLORIDE 0.9 % IV SOLN
INTRAVENOUS | Status: DC
  Administered 2018-10-24: 10:00:00 via INTRAVENOUS

## 2018-10-24 MED ORDER — DOCUSATE SODIUM 100 MG PO CAPS
100.0000 mg | ORAL_CAPSULE | Freq: Two times a day (BID) | ORAL | Status: DC
  Administered 2018-10-24 – 2018-10-25 (×2): 100 mg via ORAL
  Filled 2018-10-24 (×4): qty 1

## 2018-10-24 MED ORDER — DIAZEPAM 5 MG PO TABS: 5.0000 mg | ORAL_TABLET | Freq: Four times a day (QID) | ORAL | Status: DC

## 2018-10-24 MED ORDER — LORAZEPAM 2 MG/ML IJ SOLN: 2.0000 mg | Freq: Once | INTRAMUSCULAR | Status: DC

## 2018-10-24 MED ORDER — THIAMINE HCL 100 MG/ML IJ SOLN
100.0000 mg | Freq: Every day | INTRAMUSCULAR | Status: DC
  Filled 2018-10-24: qty 2

## 2018-10-24 MED ORDER — DIAZEPAM 5 MG PO TABS
5.0000 mg | ORAL_TABLET | Freq: Four times a day (QID) | ORAL | Status: DC
  Administered 2018-10-24 – 2018-10-25 (×4): 5 mg via ORAL
  Filled 2018-10-24 (×4): qty 1

## 2018-10-24 MED ORDER — FOLIC ACID 1 MG PO TABS
1.0000 mg | ORAL_TABLET | Freq: Every day | ORAL | Status: DC
  Administered 2018-10-24 – 2018-10-25 (×2): 1 mg via ORAL
  Filled 2018-10-24 (×4): qty 1

## 2018-10-24 MED ORDER — LORAZEPAM 2 MG/ML IJ SOLN: 1.0000 mg | Freq: Four times a day (QID) | INTRAMUSCULAR | Status: DC | PRN

## 2018-10-24 MED ORDER — ADULT MULTIVITAMIN W/MINERALS CH
1.0000 | ORAL_TABLET | Freq: Every day | ORAL | Status: DC
  Administered 2018-10-24 – 2018-10-25 (×2): 1 via ORAL
  Filled 2018-10-24 (×4): qty 1

## 2018-10-24 MED ORDER — LORAZEPAM 2 MG/ML IJ SOLN
0.0000 mg | Freq: Four times a day (QID) | INTRAMUSCULAR | Status: DC
  Administered 2018-10-24 (×2): 2 mg via INTRAVENOUS
  Filled 2018-10-24 (×2): qty 1

## 2018-10-24 MED ORDER — LORAZEPAM 2 MG/ML IJ SOLN: 0.0000 mg | Freq: Two times a day (BID) | INTRAMUSCULAR | Status: DC

## 2018-10-24 MED ORDER — ONDANSETRON HCL 4 MG PO TABS: 4.0000 mg | ORAL_TABLET | Freq: Four times a day (QID) | ORAL | Status: DC | PRN

## 2018-10-24 MED ORDER — ACETAMINOPHEN 650 MG RE SUPP: 650.0000 mg | Freq: Four times a day (QID) | RECTAL | Status: DC | PRN

## 2018-10-24 MED ORDER — ONDANSETRON HCL 4 MG/2ML IJ SOLN: 4.0000 mg | Freq: Four times a day (QID) | INTRAMUSCULAR | Status: DC | PRN

## 2018-10-24 MED ORDER — SODIUM CHLORIDE 0.9 % IV SOLN
INTRAVENOUS | Status: DC
  Administered 2018-10-24 – 2018-10-25 (×3): via INTRAVENOUS

## 2018-10-24 MED ORDER — SODIUM CHLORIDE 0.9 % IV BOLUS
1000.0000 mL | Freq: Once | INTRAVENOUS | Status: AC
  Administered 2018-10-24: 1000 mL via INTRAVENOUS

## 2018-10-24 MED ORDER — ACETAMINOPHEN 325 MG PO TABS: 650.0000 mg | ORAL_TABLET | Freq: Four times a day (QID) | ORAL | Status: DC | PRN

## 2018-10-24 MED ORDER — VITAMIN B-1 100 MG PO TABS
100.0000 mg | ORAL_TABLET | Freq: Every day | ORAL | Status: DC
  Administered 2018-10-24 – 2018-10-25 (×2): 100 mg via ORAL
  Filled 2018-10-24 (×4): qty 1

## 2018-10-24 NOTE — ED Triage Notes (Addendum)
Patient reports that she is here because she is having abdominal pain(upper) and lower right back pain. She reports history of alcohol abuse, pancreatitis, and kidney stones. She states "all I drink is liquor." she reports to have last drank 2 48oz beers this morning. Patient and spouse states she may be going into withdrawal, because she "did not drink enough" Patient continues to states she may have a seizure and her spouse is encouraging her to not "speak that."  PT is A&O X 4 Current CIWA: 1

## 2018-10-24 NOTE — H&P (Signed)
History and Physical    Joann [name] PXT:[id number] DOB: [birth year] DOA: 10/24/2018  PCP: [name], FNP Consultants:  Therapist Patient coming from:  Home - lives with husband and 40yo son; Donald Prose: Husband, [phone]  Chief Complaint: ETOH withdrawal  HPI: Joann [name] is a 38 y.o. female with medical history significant of HTN and alcohol dependence with h/o alcoholic pancreatitis presenting with alcohol withdrawal.   Her back has been hurting and she has had abdominal pain. She is trying to quit drinking.  She has a h/o withdrawal seizures and was told she needed to come to the hospital for detox prior to going to [facility].  Her last drink was about 9 this AM.  She drank 2 x 48 ounce beers today.  Usually she drinks 1-2 pints of liquor and the 2 beers.  She feels a little shaky now and itchy right now.  She has been to rehab before in [place] and [place] and [place].  Her longest period of sobriety was 2-3 months.  She started drinking at about 38yo.   ED Course:  Chronic alcoholic with chronic recurrent pancreatitis.  Presented today due to concern for jaundice, dehydrated, and ETOH withdrawal.  She has to have clearance x 24 hours prior to detox.  LFTs worsening, bili 2.5.  ?Pancreatitis.  No surgical exam.  Review of Systems: As per HPI; otherwise review of systems reviewed and negative.   Ambulatory Status:  Ambulates without assistance  Past Medical History:  Diagnosis Date  . Alcohol abuse   . Anxiety   . Hypertension     Past Surgical History:  Procedure Laterality Date  . ECTOPIC PREGNANCY SURGERY      Social History   Socioeconomic History  . Marital status: Married    Spouse name: Not on file  . Number of children: Not on file  . Years of education: Not on file  . Highest education level: Not on file  Occupational History  . Occupation: nurse  Social Needs  . Financial resource strain: Not on file  . Food insecurity:    Worry: Not on file    Inability:  Not on file  . Transportation needs:    Medical: Not on file    Non-medical: Not on file  Tobacco Use  . Smoking status: Never Smoker  . Smokeless tobacco: Never Used  Substance and Sexual Activity  . Alcohol use: Yes    Comment: "I just do it"  . Drug use: Never  . Sexual activity: Not on file  Lifestyle  . Physical activity:    Days per week: Not on file    Minutes per session: Not on file  . Stress: Not on file  Relationships  . Social connections:    Talks on phone: Not on file    Gets together: Not on file    Attends religious service: Not on file    Active member of club or organization: Not on file    Attends meetings of clubs or organizations: Not on file    Relationship status: Not on file  . Intimate partner violence:    Fear of current or ex partner: Not on file    Emotionally abused: Not on file    Physically abused: Not on file    Forced sexual activity: Not on file  Other Topics Concern  . Not on file  Social History Narrative  . Not on file    No Known Allergies  Family History  Problem Relation  Age of Onset  . Healthy Mother   . Alcohol abuse Mother        quit in her 24s  . Alcohol abuse Father   . Diabetes Father   . Healthy Brother   . Hypertension Maternal Grandmother   . Hypertension Maternal Grandfather   . Healthy Brother   . Healthy Brother     Prior to Admission medications   Medication Sig Start Date End Date Taking? Authorizing Provider  pantoprazole (PROTONIX) 40 MG tablet Take 1 tablet (40 mg total) by mouth at bedtime. Patient not taking: Reported on 10/24/2018 09/29/18   [name], FNP    Physical Exam: Vitals:   10/24/18 0945 10/24/18 1000 10/24/18 1015 10/24/18 1330  BP: 101/72 101/73 104/74 102/65  Pulse: 87   88  Resp:    16  Temp:      TempSrc:      SpO2: 97%   100%  Weight:      Height:         . General:  Appears calm and comfortable and is NAD . Eyes:  PERRL, EOMI, normal lids, iris . ENT:  grossly  normal hearing, lips & tongue, mmm; appropriate dentition . Neck:  no LAD, masses or thyromegaly . Cardiovascular:  RRR, no m/r/g. No LE edema.  Marland Kitchen Respiratory:   CTA bilaterally with no wheezes/rales/rhonchi.  Normal respiratory effort. . Abdomen:  soft, NT, ND, NABS . Skin:  no rash or induration seen on limited exam . Musculoskeletal:  grossly normal tone BUE/BLE, good ROM, no bony abnormality . Psychiatric:  grossly normal mood and affect, speech fluent and appropriate, AOx3 . Neurologic:  CN 2-12 grossly intact, moves all extremities in coordinated fashion, sensation intact    Radiological Exams on Admission: No results found.  EKG: not done   Labs on Admission: I have personally reviewed the available labs and imaging studies at the time of the admission.  Pertinent labs:   CO2 19 Glucose 118 Anion gap 20 Lipase 212; 330 on 1/24 AST 998/ALT 116/Bili 2.3; 337/73/0.9 on 1/24 WBC 3.1 Hgb 11.0 - stable Platelets 80; 188 on 1/24 UA: 20 Hgb UDS negative HCG negative ETOH 189   Assessment/Plan Principal Problem:   Alcohol dependence with withdrawal, unspecified (HCC) Active Problems:   Elevated LFTs   Thrombocytopenia (HCC)   ETOH withdrawal with h/o dependence -Patient with chronic ETOH dependence -Has failed rehab multiple times -ETOH level 189 -She is at high risk for complications of withdrawal including seizures, DTs - she has h/o withdrawal seizures in the past -If her withdrawal progresses quickly, she may require Precedex infusion -Will give Valium 5 mg PO q6h standing as well as CIWA orders -Will observe -CIWA protocol -SW consult for possible inpatient treatment -Elevated LFTs are likely related to alcoholism - will trend, since they are clearly increased from prior  Thrombocytopenia - Due to bone marrow suppression from alcohol abuse - Monitor daily platelet count     DVT prophylaxis:  SCDs Code Status:  Full - confirmed with patient Family  Communication: None present  Disposition Plan:  Home once clinically improved Consults called: SW  Admission status: It is my clinical opinion that referral for OBSERVATION is reasonable and necessary in this patient based on the above information provided. The aforementioned taken together are felt to place the patient at high risk for further clinical deterioration. However it is anticipated that the patient may be medically stable for discharge from the hospital within 24 to 48  hours.     Karmen Bongo MD [facility]   How to contact the [facility] Attending or Consulting provider [place] or covering provider during after hours [place], for this patient?  1. Check the care team in [facility] and look for a) attending/consulting TRH provider listed and b) the [facility] team listed 2. Log into [email or web address] and use [place]'s universal password to access. If you do not have the password, please contact the hospital operator. 3. Locate the [facility] provider you are looking for under [facility] and page to a number that you can be directly reached. 4. If you still have difficulty reaching the provider, please page the [facility] (Director on Call) for the Hospitalists listed on amion for assistance.   10/24/2018, 3:05 PM

## 2018-10-24 NOTE — ED Notes (Signed)
Patient ambulatory to bathroom with steady gait at this time 

## 2018-10-24 NOTE — ED Notes (Signed)
Patient given ice per Dr. Sabra Heck order.
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
## 2018-10-25 DIAGNOSIS — K7040 Alcoholic hepatic failure without coma: Secondary | ICD-10-CM

## 2018-10-25 DIAGNOSIS — D61818 Other pancytopenia: Secondary | ICD-10-CM

## 2018-10-25 DIAGNOSIS — K709 Alcoholic liver disease, unspecified: Secondary | ICD-10-CM | POA: Diagnosis present

## 2018-10-25 DIAGNOSIS — K852 Alcohol induced acute pancreatitis without necrosis or infection: Secondary | ICD-10-CM | POA: Diagnosis present

## 2018-10-25 DIAGNOSIS — Z833 Family history of diabetes mellitus: Secondary | ICD-10-CM | POA: Diagnosis not present

## 2018-10-25 DIAGNOSIS — I10 Essential (primary) hypertension: Secondary | ICD-10-CM | POA: Diagnosis present

## 2018-10-25 DIAGNOSIS — R569 Unspecified convulsions: Secondary | ICD-10-CM | POA: Diagnosis present

## 2018-10-25 DIAGNOSIS — F10288 Alcohol dependence with other alcohol-induced disorder: Secondary | ICD-10-CM | POA: Diagnosis present

## 2018-10-25 DIAGNOSIS — Y906 Blood alcohol level of 120-199 mg/100 ml: Secondary | ICD-10-CM | POA: Diagnosis present

## 2018-10-25 DIAGNOSIS — Z91410 Personal history of adult physical and sexual abuse: Secondary | ICD-10-CM | POA: Diagnosis not present

## 2018-10-25 DIAGNOSIS — Z8249 Family history of ischemic heart disease and other diseases of the circulatory system: Secondary | ICD-10-CM | POA: Diagnosis not present

## 2018-10-25 DIAGNOSIS — Z811 Family history of alcohol abuse and dependence: Secondary | ICD-10-CM | POA: Diagnosis not present

## 2018-10-25 DIAGNOSIS — F10239 Alcohol dependence with withdrawal, unspecified: Secondary | ICD-10-CM | POA: Diagnosis present

## 2018-10-25 DIAGNOSIS — R945 Abnormal results of liver function studies: Secondary | ICD-10-CM | POA: Diagnosis not present

## 2018-10-25 LAB — CBC
HCT: 27 % — ABNORMAL LOW (ref 36.0–46.0)
Hemoglobin: 9 g/dL — ABNORMAL LOW (ref 12.0–15.0)
MCH: 34.1 pg — ABNORMAL HIGH (ref 26.0–34.0)
MCHC: 33.3 g/dL (ref 30.0–36.0)
MCV: 102.3 fL — ABNORMAL HIGH (ref 80.0–100.0)
Platelets: 57 10*3/uL — ABNORMAL LOW (ref 150–400)
RBC: 2.64 MIL/uL — AB (ref 3.87–5.11)
RDW: 15.3 % (ref 11.5–15.5)
WBC: 2.7 10*3/uL — ABNORMAL LOW (ref 4.0–10.5)
nRBC: 0 % (ref 0.0–0.2)

## 2018-10-25 LAB — HEPATIC FUNCTION PANEL
ALBUMIN: 2.6 g/dL — AB (ref 3.5–5.0)
ALT: 81 U/L — ABNORMAL HIGH (ref 0–44)
AST: 686 U/L — AB (ref 15–41)
Alkaline Phosphatase: 250 U/L — ABNORMAL HIGH (ref 38–126)
Bilirubin, Direct: 1.7 mg/dL — ABNORMAL HIGH (ref 0.0–0.2)
Indirect Bilirubin: 1.2 mg/dL — ABNORMAL HIGH (ref 0.3–0.9)
Total Bilirubin: 2.9 mg/dL — ABNORMAL HIGH (ref 0.3–1.2)
Total Protein: 5.7 g/dL — ABNORMAL LOW (ref 6.5–8.1)

## 2018-10-25 LAB — BASIC METABOLIC PANEL
ANION GAP: 11 (ref 5–15)
BUN: 5 mg/dL — ABNORMAL LOW (ref 6–20)
CO2: 20 mmol/L — ABNORMAL LOW (ref 22–32)
Calcium: 7.8 mg/dL — ABNORMAL LOW (ref 8.9–10.3)
Chloride: 103 mmol/L (ref 98–111)
Creatinine, Ser: 0.62 mg/dL (ref 0.44–1.00)
GFR calc Af Amer: >60 mL/min (ref >60)
GFR calc non Af Amer: >60 mL/min (ref >60)
Glucose, Bld: 94 mg/dL (ref 70–99)
POTASSIUM: 3.9 mmol/L (ref 3.5–5.1)
Sodium: 134 mmol/L — ABNORMAL LOW (ref 135–145)

## 2018-10-25 MED ORDER — DIAZEPAM 2 MG PO TABS
2.0000 mg | ORAL_TABLET | Freq: Four times a day (QID) | ORAL | Status: DC
  Administered 2018-10-25 – 2018-10-26 (×4): 2 mg via ORAL
  Filled 2018-10-25 (×4): qty 1

## 2018-10-25 NOTE — Progress Notes (Signed)
Progress Note    [name]  BSW:[id number] DOB: [birth year]  DOA: 10/24/2018 PCP: [name], FNP    Brief Narrative:     Medical records reviewed and are as summarized below:  Joann [name] is an 38 y.o. female  with medical history significant of HTN and alcohol dependence with h/o alcoholic pancreatitis presenting with alcohol withdrawal.   Her back has been hurting and she has had abdominal pain. She is trying to quit drinking.  She has a h/o withdrawal seizures and was told she needed to come to the hospital for detox prior to going to [place].  Assessment/Plan:   Principal Problem:   Alcohol dependence with withdrawal, unspecified ([place]) Active Problems:   Elevated LFTs   Thrombocytopenia (HCC)  ETOH withdrawal with h/o dependence and withdrawal seizures -Patient with chronic ETOH dependence -Has failed rehab multiple times -ETOH level was 189 upon presentation to the ER -She is at high risk for complications of withdrawal including seizures, DTs - she has h/o withdrawal seizures in the past - Valium 5 mg PO q6h standing as well as CIWA orders (despite scheduled valium, she was still  -SW consult for possible inpatient treatment  Elevated LFTs -trend  pancytopenia - Due to bone marrow suppression/liver disease from alcohol abuse - daily labs    Family Communication/Anticipated D/C date and plan/Code Status   DVT prophylaxis: scd Code Status: Full Code.  Family Communication:  Disposition Plan: pending detox/resolution of symptoms-- as alcohol level was 189 yesterday suspect will take 1-2 more days for detox  Medical Consultants:    None.    Subjective:   Has plan to go to rehab once she gets through detox  Objective:    Vitals:   10/24/18 2045 10/24/18 2251 10/25/18 0558 10/25/18 0740  BP: 102/67 99/72 98/67  104/81  Pulse:  86 74 76  Resp: 20 20  16   Temp: 99.7 F (37.6 C) 99.3 F (37.4 C) 98 F (36.7 C) 98.5 F (36.9 C)    TempSrc: Oral Oral Oral Oral  SpO2: 98% 97% 98% 98%  Weight:      Height:        Intake/Output Summary (Last 24 hours) at 10/25/2018 4665 Last data filed at 10/25/2018 0636 Gross per 24 hour  Intake 3115 ml  Output -  Net 3115 ml   Filed Weights   10/24/18 0924  Weight: 64.9 kg    Exam: In bed, tremulous rrr +BS, tender to palpation in epigastric region A+OX3 No focal neurological issues Calm and cooperative  Data Reviewed:   I have personally reviewed following labs and imaging studies:  Labs: Labs show the following:   Basic Metabolic Panel: Recent Labs  Lab 10/24/18 1018 10/25/18 0538  NA 138 134*  K 3.9 3.9  CL 99 103  CO2 19* 20*  GLUCOSE 118* 94  BUN 8 5*  CREATININE 0.61 0.62  CALCIUM 8.8* 7.8*   GFR Estimated Creatinine Clearance: 81.5 mL/min (by C-G formula based on SCr of 0.62 mg/dL). Liver Function Tests: Recent Labs  Lab 10/24/18 1018 10/25/18 0538  AST 998* 686*  ALT 116* 81*  ALKPHOS 334* 250*  BILITOT 2.3* 2.9*  PROT 7.8 5.7*  ALBUMIN 3.6 2.6*   Recent Labs  Lab 10/24/18 1018  LIPASE 212*   No results for input(s): AMMONIA in the last 168 hours. Coagulation profile No results for input(s): INR, PROTIME in the last 168 hours.  CBC: Recent Labs  Lab 10/24/18 1018 10/25/18 0538  WBC 3.1* 2.7*  NEUTROABS 2.1  --   HGB 11.0* 9.0*  HCT 34.0* 27.0*  MCV 101.8* 102.3*  PLT 80* 57*   Cardiac Enzymes: No results for input(s): CKTOTAL, CKMB, CKMBINDEX, TROPONINI in the last 168 hours. BNP (last 3 results) No results for input(s): PROBNP in the last 8760 hours. CBG: Recent Labs  Lab 10/24/18 1033  GLUCAP 168*   D-Dimer: No results for input(s): DDIMER in the last 72 hours. Hgb A1c: No results for input(s): HGBA1C in the last 72 hours. Lipid Profile: No results for input(s): CHOL, HDL, LDLCALC, TRIG, CHOLHDL, LDLDIRECT in the last 72 hours. Thyroid function studies: No results for input(s): TSH, T4TOTAL, T3FREE,  THYROIDAB in the last 72 hours.  Invalid input(s): FREET3 Anemia work up: No results for input(s): VITAMINB12, FOLATE, FERRITIN, TIBC, IRON, RETICCTPCT in the last 72 hours. Sepsis Labs: Recent Labs  Lab 10/24/18 1018 10/25/18 0538  WBC 3.1* 2.7*    Microbiology No results found for this or any previous visit (from the past 240 hour(s)).  Procedures and diagnostic studies:  No results found.  Medications:   . diazepam  5 mg Oral Q6H  . docusate sodium  100 mg Oral BID  . folic acid  1 mg Oral Daily  . LORazepam  0-4 mg Intravenous Q6H   Followed by  . [START ON 10/26/2018] LORazepam  0-4 mg Intravenous Q12H  . LORazepam  2 mg Intravenous Once  . multivitamin with minerals  1 tablet Oral Daily  . thiamine  100 mg Oral Daily   Or  . thiamine  100 mg Intravenous Daily   Continuous Infusions: . sodium chloride 150 mL/hr at 10/24/18 2300     LOS: 0 days   Geradine Girt  [facility]   How to contact the [facility] Attending or Consulting provider [place] or covering provider during after hours [place], for this patient?  1. Check the care team in [facility] and look for a) attending/consulting [facility] provider listed and b) the [facility] team listed 2. Log into [email or web address] and use [place]'s universal password to access. If you do not have the password, please contact the hospital operator. 3. Locate the [facility] provider you are looking for under [facility] and page to a number that you can be directly reached. 4. If you still have difficulty reaching the provider, please page the [facility] (Director on Call) for the Hospitalists listed on amion for assistance.  10/25/2018, 9:28 AM

## 2018-10-25 NOTE — Progress Notes (Signed)
Report called to [facility], spoke to [place] has all belongings  Pt transferred via wheelchair with staff

## 2018-10-26 DIAGNOSIS — F10239 Alcohol dependence with withdrawal, unspecified: Secondary | ICD-10-CM

## 2018-10-26 LAB — COMPREHENSIVE METABOLIC PANEL
ALT: 74 U/L — ABNORMAL HIGH (ref 0–44)
AST: 450 U/L — ABNORMAL HIGH (ref 15–41)
Albumin: 2.8 g/dL — ABNORMAL LOW (ref 3.5–5.0)
Alkaline Phosphatase: 257 U/L — ABNORMAL HIGH (ref 38–126)
Anion gap: 10 (ref 5–15)
BUN: <5 mg/dL — ABNORMAL LOW (ref 6–20)
CO2: 22 mmol/L (ref 22–32)
Calcium: 8.4 mg/dL — ABNORMAL LOW (ref 8.9–10.3)
Chloride: 103 mmol/L (ref 98–111)
Creatinine, Ser: 0.57 mg/dL (ref 0.44–1.00)
GFR calc Af Amer: >60 mL/min (ref >60)
GFR calc non Af Amer: >60 mL/min (ref >60)
Glucose, Bld: 93 mg/dL (ref 70–99)
Potassium: 3.7 mmol/L (ref 3.5–5.1)
Sodium: 135 mmol/L (ref 135–145)
Total Bilirubin: 3.2 mg/dL — ABNORMAL HIGH (ref 0.3–1.2)
Total Protein: 6.3 g/dL — ABNORMAL LOW (ref 6.5–8.1)

## 2018-10-26 LAB — CBC
HCT: 27 % — ABNORMAL LOW (ref 36.0–46.0)
Hemoglobin: 9.2 g/dL — ABNORMAL LOW (ref 12.0–15.0)
MCH: 34.5 pg — ABNORMAL HIGH (ref 26.0–34.0)
MCHC: 34.1 g/dL (ref 30.0–36.0)
MCV: 101.1 fL — ABNORMAL HIGH (ref 80.0–100.0)
Platelets: 54 10*3/uL — ABNORMAL LOW (ref 150–400)
RBC: 2.67 MIL/uL — ABNORMAL LOW (ref 3.87–5.11)
RDW: 14.8 % (ref 11.5–15.5)
WBC: 3.3 10*3/uL — AB (ref 4.0–10.5)
nRBC: 0 % (ref 0.0–0.2)

## 2018-10-26 LAB — PROTIME-INR
INR: 1.12
Prothrombin Time: 14.3 seconds (ref 11.4–15.2)

## 2018-10-26 LAB — APTT: aPTT: 29 seconds (ref 24–36)

## 2018-10-26 MED ORDER — ADULT MULTIVITAMIN W/MINERALS CH: 1.0000 | ORAL_TABLET | Freq: Every day | ORAL | 0 refills | Status: DC

## 2018-10-26 MED ORDER — THIAMINE HCL 100 MG PO TABS: 100.0000 mg | ORAL_TABLET | Freq: Every day | ORAL | 0 refills | Status: DC

## 2018-10-26 MED ORDER — DIAZEPAM 2 MG PO TABS: ORAL_TABLET | ORAL | 0 refills | Status: DC

## 2018-10-26 MED ORDER — FOLIC ACID 1 MG PO TABS: 1.0000 mg | ORAL_TABLET | Freq: Every day | ORAL | 0 refills | Status: DC

## 2018-10-26 MED ORDER — DOCUSATE SODIUM 100 MG PO CAPS: 100.0000 mg | ORAL_CAPSULE | Freq: Two times a day (BID) | ORAL | 0 refills | Status: DC

## 2018-10-26 NOTE — Discharge Summary (Addendum)
Physician Discharge Summary  Joann [name] LAG:[id number] DOB: 1981-05-23 DOA: 10/24/2018  PCP: [name], FNP  Admit date: 10/24/2018 Discharge date: 10/26/2018  Admitted From: Home  Disposition:  Home, she is going to rehab.    Recommendations for Outpatient Follow-up:  1. Follow up with PCP in 1-2 weeks 2. She needs LFT in 24 hours. To follow bilirubin.  3. Needs to continue with detox.   Home Health:none  Discharge Condition: stable.  CODE STATUS; full code.  Diet recommendation: low fat diet   Brief/Interim Summary: 38 year old with past medical history significant for dense, history of alcoholic pancreatitis who presented with alcohol withdrawal.  She has a history of withdrawal seizure.  She wants to stop drinking alcohol, her plan is to go to [facility].  1-Acohol withdrawal; of dependence and withdrawal seizure; She was admitted on 2-14.  On CIWA protocol.  Her higher CIWA score has been 9.  Her score today is 0. She was also started on Valium: 2 mg every 6 hours.  She will need a taper of Valium; she will need 2 mg every 6 hours for 1 day, then 2 mg every 8 hours for 1 day, then 2 mg every 12 hours for 1 day.  Then 2 mg for 1 day then stop.  As she tolerated. Require Ativan for the last 48 hours.  2-Transaminases; alcoholic pancreatitis; Liver function tests continue to trend down.  Her bilirubin is a still elevated at 3.0.  She will need very close follow-up of liver function test.  Will recommend repeat liver function test in 24 hours.  PT, PTT result. Normal DES score less than 3. No need for prednisone.   3-pancytopenia; Likely related to alcohol. Improving.  Discharge Diagnoses:  Principal Problem:   Alcohol dependence with withdrawal, unspecified ([place]) Active Problems:   Alcohol withdrawal ([place])   Elevated LFTs   Thrombocytopenia (HCC)    Discharge Instructions  Discharge Instructions    Diet - low sodium heart healthy   Complete by:  As  directed    Increase activity slowly   Complete by:  As directed      Allergies as of 10/26/2018   No Known Allergies     Medication List    STOP taking these medications   pantoprazole 40 MG tablet Commonly known as:  PROTONIX     TAKE these medications   diazepam 2 MG tablet Commonly known as:  VALIUM Take 2 mg every 6 hours for one day then 2 mg Every 8 hours for one day, then 2 mg every 12 hours for one day. Then 2 mg for one day.   docusate sodium 100 MG capsule Commonly known as:  COLACE Take 1 capsule (100 mg total) by mouth 2 (two) times daily.   folic acid 1 MG tablet Commonly known as:  FOLVITE Take 1 tablet (1 mg total) by mouth daily.   multivitamin with minerals Tabs tablet Take 1 tablet by mouth daily.   thiamine 100 MG tablet Take 1 tablet (100 mg total) by mouth daily.       No Known Allergies  Consultations:  none   Procedures/Studies:  No results found.  Subjective: She denies tremors.  She report mild abdominal discomfort. She is determine to stop drinking alcohol. She is going to a rehab from the hospital   Discharge Exam: Vitals:   10/26/18 0523 10/26/18 0724  BP: 98/70 97/79  Pulse: 82 79  Resp: 18 18  Temp: 98 F (36.7 C)  98 F (36.7 C)  SpO2: 99% 100%   Vitals:   10/25/18 1649 10/25/18 2045 10/26/18 0523 10/26/18 0724  BP: 106/75 98/76 98/70  97/79  Pulse: 79 79 82 79  Resp: 18 18 18 18   Temp: 98.5 F (36.9 C) 98 F (36.7 C) 98 F (36.7 C) 98 F (36.7 C)  TempSrc: Oral Oral Oral Oral  SpO2: 100% 97% 99% 100%  Weight:  68.5 kg    Height:        General: Pt is alert, awake, not in acute distress Cardiovascular: RRR, S1/S2 +, no rubs, no gallops Respiratory: CTA bilaterally, no wheezing, no rhonchi Abdominal: Soft, NT, ND, bowel sounds + Extremities: no edema, no cyanosis    The results of significant diagnostics from this hospitalization (including imaging, microbiology, ancillary and laboratory) are listed  below for reference.     Microbiology: No results found for this or any previous visit (from the past 240 hour(s)).   Labs: BNP (last 3 results) No results for input(s): BNP in the last 8760 hours. Basic Metabolic Panel: Recent Labs  Lab 10/24/18 1018 10/25/18 0538 10/26/18 0351  NA 138 134* 135  K 3.9 3.9 3.7  CL 99 103 103  CO2 19* 20* 22  GLUCOSE 118* 94 93  BUN 8 5* <5*  CREATININE 0.61 0.62 0.57  CALCIUM 8.8* 7.8* 8.4*   Liver Function Tests: Recent Labs  Lab 10/24/18 1018 10/25/18 0538 10/26/18 0351  AST 998* 686* 450*  ALT 116* 81* 74*  ALKPHOS 334* 250* 257*  BILITOT 2.3* 2.9* 3.2*  PROT 7.8 5.7* 6.3*  ALBUMIN 3.6 2.6* 2.8*   Recent Labs  Lab 10/24/18 1018  LIPASE 212*   No results for input(s): AMMONIA in the last 168 hours. CBC: Recent Labs  Lab 10/24/18 1018 10/25/18 0538 10/26/18 0351  WBC 3.1* 2.7* 3.3*  NEUTROABS 2.1  --   --   HGB 11.0* 9.0* 9.2*  HCT 34.0* 27.0* 27.0*  MCV 101.8* 102.3* 101.1*  PLT 80* 57* 54*   Cardiac Enzymes: No results for input(s): CKTOTAL, CKMB, CKMBINDEX, TROPONINI in the last 168 hours. BNP: Invalid input(s): POCBNP CBG: Recent Labs  Lab 10/24/18 1033  GLUCAP 168*   D-Dimer No results for input(s): DDIMER in the last 72 hours. Hgb A1c No results for input(s): HGBA1C in the last 72 hours. Lipid Profile No results for input(s): CHOL, HDL, LDLCALC, TRIG, CHOLHDL, LDLDIRECT in the last 72 hours. Thyroid function studies No results for input(s): TSH, T4TOTAL, T3FREE, THYROIDAB in the last 72 hours.  Invalid input(s): FREET3 Anemia work up No results for input(s): VITAMINB12, FOLATE, FERRITIN, TIBC, IRON, RETICCTPCT in the last 72 hours. Urinalysis    Component Value Date/Time   COLORURINE AMBER (A) 10/24/2018 1018   APPEARANCEUR HAZY (A) 10/24/2018 1018   LABSPEC 1.017 10/24/2018 1018   PHURINE 6.0 10/24/2018 1018   GLUCOSEU NEGATIVE 10/24/2018 1018   HGBUR NEGATIVE 10/24/2018 [place] 10/24/2018 1018   KETONESUR 20 (A) 10/24/2018 1018   PROTEINUR NEGATIVE 10/24/2018 1018   NITRITE NEGATIVE 10/24/2018 1018   LEUKOCYTESUR NEGATIVE 10/24/2018 1018   Sepsis Labs Invalid input(s): PROCALCITONIN,  WBC,  LACTICIDVEN Microbiology No results found for this or any previous visit (from the past 240 hour(s)).   Time coordinating discharge: 35 minutes.   SIGNED:   Elmarie Shiley, MD  [facility] 10/26/2018, 11:00 AM

## 2018-11-06 ENCOUNTER — Ambulatory Visit: Payer: Self-pay | Admitting: Family Medicine

## 2018-11-18 ENCOUNTER — Institutional Professional Consult (permissible substitution): Payer: Self-pay | Admitting: Licensed Clinical Social Worker

## 2018-11-18 ENCOUNTER — Ambulatory Visit: Payer: Self-pay | Admitting: Family Medicine

## 2018-11-21 ENCOUNTER — Encounter (HOSPITAL_COMMUNITY): Payer: Self-pay

## 2018-11-21 ENCOUNTER — Emergency Department (HOSPITAL_COMMUNITY)
Admission: EM | Admit: 2018-11-21 | Discharge: 2018-11-22 | Disposition: A | Discharge status: Home / Self Care | Payer: Medicaid Other | Attending: Emergency Medicine | Admitting: Emergency Medicine

## 2018-11-21 ENCOUNTER — Emergency Department (HOSPITAL_COMMUNITY): Payer: Medicaid Other

## 2018-11-21 DIAGNOSIS — F10221 Alcohol dependence with intoxication delirium: Secondary | ICD-10-CM

## 2018-11-21 DIAGNOSIS — F10231 Alcohol dependence with withdrawal delirium: Principal | ICD-10-CM

## 2018-11-21 DIAGNOSIS — R4182 Altered mental status, unspecified: Secondary | ICD-10-CM | POA: Diagnosis present

## 2018-11-21 LAB — URINALYSIS, ROUTINE W REFLEX MICROSCOPIC
Bilirubin Urine: NEGATIVE
Glucose, UA: NEGATIVE mg/dL
Ketones, ur: NEGATIVE mg/dL
Leukocytes,Ua: NEGATIVE
Nitrite: NEGATIVE
Protein, ur: NEGATIVE mg/dL
Specific Gravity, Urine: 1.004 — ABNORMAL LOW (ref 1.005–1.030)
pH: 7 (ref 5.0–8.0)

## 2018-11-21 LAB — COMPREHENSIVE METABOLIC PANEL
ALT: 59 U/L — ABNORMAL HIGH (ref 0–44)
AST: 360 U/L — ABNORMAL HIGH (ref 15–41)
Albumin: 3.3 g/dL — ABNORMAL LOW (ref 3.5–5.0)
Alkaline Phosphatase: 242 U/L — ABNORMAL HIGH (ref 38–126)
Anion gap: 7 (ref 5–15)
BUN: 6 mg/dL (ref 6–20)
CO2: 21 mmol/L — ABNORMAL LOW (ref 22–32)
Calcium: 8.5 mg/dL — ABNORMAL LOW (ref 8.9–10.3)
Chloride: 113 mmol/L — ABNORMAL HIGH (ref 98–111)
Creatinine, Ser: 0.58 mg/dL (ref 0.44–1.00)
GFR calc non Af Amer: >60 mL/min (ref >60)
Glucose, Bld: 105 mg/dL — ABNORMAL HIGH (ref 70–99)
Potassium: 3.6 mmol/L (ref 3.5–5.1)
SODIUM: 141 mmol/L (ref 135–145)
Total Bilirubin: 0.8 mg/dL (ref 0.3–1.2)
Total Protein: 7.4 g/dL (ref 6.5–8.1)

## 2018-11-21 LAB — ACETAMINOPHEN LEVEL: Acetaminophen (Tylenol), Serum: <10 ug/mL — ABNORMAL LOW (ref 10–30)

## 2018-11-21 LAB — CBC WITH DIFFERENTIAL/PLATELET
Abs Immature Granulocytes: 0.01 10*3/uL (ref 0.00–0.07)
Basophils Absolute: 0 10*3/uL (ref 0.0–0.1)
Basophils Relative: 0 %
Eosinophils Absolute: 0.1 10*3/uL (ref 0.0–0.5)
Eosinophils Relative: 1 %
HCT: 35.2 % — ABNORMAL LOW (ref 36.0–46.0)
Hemoglobin: 11.3 g/dL — ABNORMAL LOW (ref 12.0–15.0)
Immature Granulocytes: 0 %
Lymphocytes Relative: 58 %
Lymphs Abs: 2.1 10*3/uL (ref 0.7–4.0)
MCH: 31.5 pg (ref 26.0–34.0)
MCHC: 32.1 g/dL (ref 30.0–36.0)
MCV: 98.1 fL (ref 80.0–100.0)
Monocytes Absolute: 0.3 10*3/uL (ref 0.1–1.0)
Monocytes Relative: 9 %
Neutro Abs: 1.1 10*3/uL — ABNORMAL LOW (ref 1.7–7.7)
Neutrophils Relative %: 32 %
Platelets: 145 10*3/uL — ABNORMAL LOW (ref 150–400)
RBC: 3.59 MIL/uL — ABNORMAL LOW (ref 3.87–5.11)
RDW: 14.3 % (ref 11.5–15.5)
WBC: 3.6 10*3/uL — ABNORMAL LOW (ref 4.0–10.5)
nRBC: 0 % (ref 0.0–0.2)

## 2018-11-21 LAB — SALICYLATE LEVEL: Salicylate Lvl: <7 mg/dL (ref 2.8–30.0)

## 2018-11-21 LAB — RAPID URINE DRUG SCREEN, HOSP PERFORMED
Amphetamines: NOT DETECTED
Barbiturates: NOT DETECTED
Benzodiazepines: NOT DETECTED
Cocaine: NOT DETECTED
Opiates: NOT DETECTED
Tetrahydrocannabinol: NOT DETECTED

## 2018-11-21 LAB — ETHANOL: Alcohol, Ethyl (B): 441 mg/dL (ref <10)

## 2018-11-21 LAB — I-STAT BETA HCG BLOOD, ED (MC, WL, AP ONLY): I-stat hCG, quantitative: <5 m[IU]/mL (ref <5)

## 2018-11-21 LAB — AMMONIA: Ammonia: 45 umol/L — ABNORMAL HIGH (ref 9–35)

## 2018-11-21 LAB — LACTIC ACID, PLASMA: Lactic Acid, Venous: 1.8 mmol/L (ref 0.5–1.9)

## 2018-11-21 MED ORDER — SODIUM CHLORIDE 0.9 % IV BOLUS
1000.0000 mL | Freq: Once | INTRAVENOUS | Status: AC
  Administered 2018-11-21: 1000 mL via INTRAVENOUS

## 2018-11-21 MED ORDER — ONDANSETRON HCL 4 MG/2ML IJ SOLN
4.0000 mg | Freq: Once | INTRAMUSCULAR | Status: AC
  Administered 2018-11-21: 4 mg via INTRAVENOUS
  Filled 2018-11-21: qty 2

## 2018-11-21 NOTE — ED Triage Notes (Signed)
Patient BIB [facility] for altered mental status. Per EMS patient called out for fever, chills and cough but then refused transport. While they were on scene patient started to repeat herself and ask repetitive questions. 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 Patient denies chest pain, SOB, or abdominal pain. Patient is aggressive with staff, but easily redirected.   Patient is poor historian.   CBG 101 BP 145/98 HR 80 100% RA

## 2018-11-21 NOTE — ED Notes (Signed)
Patient transported to CT 

## 2018-11-21 NOTE — ED Notes (Signed)
Patients husband called and requested to speak with this RN. Husband states" I'm out of town and I don't know what is going on. I don't know if she is drinking again or not. She needs some sleeping medication, she just needs some rest".   Husband requests MD to call during examine. [phone]

## 2018-11-21 NOTE — ED Notes (Signed)
Patient refused to change into gown at this time. MD made aware.

## 2018-11-22 LAB — LACTIC ACID, PLASMA: Lactic Acid, Venous: 2 mmol/L (ref 0.5–1.9)

## 2018-11-22 MED ORDER — DISULFIRAM 250 MG PO TABS: 500.0000 mg | ORAL_TABLET | Freq: Every day | ORAL | 0 refills | Status: AC

## 2018-11-22 MED ORDER — CHLORDIAZEPOXIDE HCL 25 MG PO CAPS: ORAL_CAPSULE | ORAL | 0 refills | Status: DC

## 2018-11-22 NOTE — ED Notes (Signed)
This RN went to review discharge papers with patient who is getting dressed and agrees to wait in waiting room at this time.

## 2018-11-22 NOTE — ED Notes (Signed)
Patient started yelling at this RN stating "You are not just going to through me out when I don't have a ride at this time. I'm working on getting a ride". Informed patient she can wait in the waiting room until the bus runs in the morning or her ride comes and get her. These rooms are for patients who need to be seen by a provider which she already has.   Patient pulled all leads off before last set of vitals could be recorded. Patient continued to use raised voice stating she is not leaving the room. Security made aware.

## 2018-11-22 NOTE — ED Provider Notes (Signed)
[name] Provider Note   CSN: [id number] Arrival date & time: 11/21/18  2048    History   Chief Complaint Chief Complaint  Patient presents with  . Altered Mental Status    HPI Joann [name] is a 38 y.o. female.     HPI  38 year old female with a history of alcohol dependence, alcohol withdrawal, alcohol induced pancreatitis, pancytopenia and transaminitis, who presents with concern for altered mental status.  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  EMS reports that they had been called out, and patient initially refused transport, however while there on the scene, she began to ask repetitive questions.  On my history, she denies cough, congestion or fever, but does report she has had "chill bumps" reports she is had nausea, vomiting and diarrhea, but is unable to state a timeframe.  Denies abdominal pain, chest pain or shortness of breath.  She reports she also has a headache.  She is not sure if she hit her head.  On my initial history, she reports she has not had any alcohol for 2 days, but then later reports that she has been drinking.  She reported to her family that she had coronavirus and tested positive. They report she seemed delirious.      Past Medical History:  Diagnosis Date  . Alcohol abuse   . Anxiety   . Hypertension     Patient Active Problem List   Diagnosis Date Noted  . Alcohol withdrawal ([place]) 10/24/2018  . Alcohol dependence with withdrawal, unspecified ([place]) 10/24/2018  . Elevated LFTs 10/24/2018  . Thrombocytopenia ([place]) 10/24/2018  . Alcohol-induced acute pancreatitis   . Acute blood loss anemia   . Alcoholic ketoacidosis 28/31/5176  . Alcohol abuse with alcohol-induced mood disorder ([place]) 09/14/2017    Past Surgical History:  Procedure Laterality Date  . ECTOPIC PREGNANCY SURGERY       OB History   No  obstetric history on file.      Home Medications    Prior to Admission medications   Medication Sig Start Date End Date Taking? Authorizing Provider  chlordiazePOXIDE (LIBRIUM) 25 MG capsule 38m PO TID x 1D, then 25-540mPO BID X 1D, then 25-5034mO QD X 1D 11/22/18   SchGareth MorganD  diazepam (VALIUM) 2 MG tablet Take 2 mg every 6 hours for one day then 2 mg Every 8 hours for one day, then 2 mg every 12 hours for one day. Then 2 mg for one day. 10/26/18   Regalado, Belkys A, MD  disulfiram (ANTABUSE) 250 MG tablet Take 2 tablets (500 mg total) by mouth daily for 7 days. 11/22/18 11/29/18  SchGareth MorganD  docusate sodium (COLACE) 100 MG capsule Take 1 capsule (100 mg total) by mouth 2 (two) times daily. 10/26/18   Regalado, Belkys A, MD  folic acid (FOLVITE) 1 MG tablet Take 1 tablet (1 mg total) by mouth daily. 10/26/18   Regalado, Belkys A, MD  Multiple Vitamin (MULTIVITAMIN WITH MINERALS) TABS tablet Take 1 tablet by mouth daily. 10/26/18   Regalado, Belkys A, MD  thiamine 100 MG tablet Take 1 tablet (100 mg total) by mouth daily. 10/26/18   Regalado, BelCassie FreerD    Family History Family History  Problem Relation Age of Onset  . Healthy Mother   . Alcohol abuse Mother  quit in her 29s  . Alcohol abuse Father   . Diabetes Father   . Healthy Brother   . Hypertension Maternal Grandmother   . Hypertension Maternal Grandfather   . Healthy Brother   . Healthy Brother     Social History Social History   Tobacco Use  . Smoking status: Never Smoker  . Smokeless tobacco: Never Used  Substance Use Topics  . Alcohol use: Yes    Comment: "I just do it"  . Drug use: Never     Allergies   Patient has no known allergies.   Review of Systems Review of Systems  Constitutional: Positive for chills. Negative for fever.  Eyes: Negative for visual disturbance.  Respiratory: Negative for cough and shortness of breath.   Cardiovascular: Negative for chest pain.   Gastrointestinal: Positive for diarrhea, nausea and vomiting. Negative for abdominal pain.  Genitourinary: Negative for dysuria.  Musculoskeletal: Negative for back pain.  Skin: Negative for rash.  Neurological: Positive for headaches. Negative for seizures and weakness.     Physical Exam Updated Vital Signs BP 103/80   Pulse 81   Temp 97.7 F (36.5 C) (Oral)   Resp 14   Ht 5' 5"  (1.651 m)   LMP  (LMP Unknown)   SpO2 100%   BMI 25.13 kg/m   Physical Exam Vitals signs and nursing note reviewed.  Constitutional:      General: She is not in acute distress.    Appearance: She is well-developed. She is not diaphoretic.     Comments: intoxicated  HENT:     Head: Normocephalic and atraumatic.  Eyes:     Conjunctiva/sclera: Conjunctivae normal.  Neck:     Musculoskeletal: Normal range of motion.  Cardiovascular:     Rate and Rhythm: Normal rate and regular rhythm.     Heart sounds: Normal heart sounds. No murmur. No friction rub. No gallop.   Pulmonary:     Effort: Pulmonary effort is normal. No respiratory distress.     Breath sounds: Normal breath sounds. No wheezing or rales.  Abdominal:     General: There is no distension.     Palpations: Abdomen is soft.     Tenderness: There is no abdominal tenderness. There is no guarding.  Musculoskeletal:        General: No tenderness.  Skin:    General: Skin is warm and dry.     Findings: No erythema or rash.  Neurological:     Mental Status: She is alert.     Comments: Intoxicated, moving all 4 extremities equally      ED Treatments / Results  Labs (all labs ordered are listed, but only abnormal results are displayed) Labs Reviewed  CBC WITH DIFFERENTIAL/PLATELET - Abnormal; Notable for the following components:      Result Value   WBC 3.6 (*)    RBC 3.59 (*)    Hemoglobin 11.3 (*)    HCT 35.2 (*)    Platelets 145 (*)    Neutro Abs 1.1 (*)    All other components within normal limits  COMPREHENSIVE METABOLIC  PANEL - Abnormal; Notable for the following components:   Chloride 113 (*)    CO2 21 (*)    Glucose, Bld 105 (*)    Calcium 8.5 (*)    Albumin 3.3 (*)    AST 360 (*)    ALT 59 (*)    Alkaline Phosphatase 242 (*)    All other components within normal limits  AMMONIA -  Abnormal; Notable for the following components:   Ammonia 45 (*)    All other components within normal limits  LACTIC ACID, PLASMA - Abnormal; Notable for the following components:   Lactic Acid, Venous 2.0 (*)    All other components within normal limits  ETHANOL - Abnormal; Notable for the following components:   Alcohol, Ethyl (B) 441 (*)    All other components within normal limits  URINALYSIS, ROUTINE W REFLEX MICROSCOPIC - Abnormal; Notable for the following components:   Color, Urine STRAW (*)    Specific Gravity, Urine 1.004 (*)    Hgb urine dipstick LARGE (*)    Bacteria, UA RARE (*)    All other components within normal limits  ACETAMINOPHEN LEVEL - Abnormal; Notable for the following components:   Acetaminophen (Tylenol), Serum <10 (*)    All other components within normal limits  LACTIC ACID, PLASMA  RAPID URINE DRUG SCREEN, HOSP PERFORMED  SALICYLATE LEVEL  I-STAT BETA HCG BLOOD, ED ([facility], [facility], [facility] ONLY)    EKG EKG Interpretation  Date/Time:  Friday November 21 2018 22:12:00 EDT Ventricular Rate:  57 PR Interval:    QRS Duration: 107 QT Interval:  472 QTC Calculation: 460 R Axis:   77 Text Interpretation:  Sinus rhythm Atrial premature complex Low voltage, precordial leads Nonspecific T abnrm, anterolateral leads Since prior ECG, TW inversions new in comparison to prior Confirmed by Gareth Morgan [phone]) on 11/22/2018 12:03:13 AM   Radiology Ct Head Wo Contrast  Result Date: 11/21/2018 CLINICAL DATA:  38 year old female with altered mental status, fever, chills and cough. EXAM: CT HEAD WITHOUT CONTRAST TECHNIQUE: Contiguous axial images were obtained from the base of the skull through the vertex  without intravenous contrast. COMPARISON:  None. FINDINGS: Skull base is degraded by motion artifact. Brain: Cerebral volume seems decreased for age in a generalized fashion. No midline shift, ventriculomegaly, mass effect, evidence of mass lesion, intracranial hemorrhage or evidence of cortically based acute infarction. Gray-white matter differentiation is within normal limits throughout the brain. No cortical encephalomalacia identified. Vascular: No suspicious intracranial vascular hyperdensity. Skull: No acute osseous abnormality identified. Sinuses/Orbits: Visualized paranasal sinuses and mastoids are well pneumatized. Other: Orbits are obscured. No definite acute scalp soft tissue finding. IMPRESSION: 1. No acute intracranial abnormality. 2. Cerebral volume is decreased for age. Electronically Signed   By: Genevie Ann M.D.   On: 11/21/2018 21:52    Procedures Procedures (including critical care time)  Medications Ordered in ED Medications  sodium chloride 0.9 % bolus 1,000 mL (0 mLs Intravenous Stopped 11/22/18 0018)  ondansetron (ZOFRAN) injection 4 mg (4 mg Intravenous Given 11/21/18 2211)     Initial Impression / Assessment and Plan / ED Course  I have reviewed the triage vital signs and the nursing notes.  Pertinent labs & imaging results that were available during my care of the patient were reviewed by me and considered in my medical decision making (see chart for details).        38 year old female with a history of alcohol dependence, alcohol withdrawal, alcohol induced pancreatitis, pancytopenia and transaminitis, who presents with concern for altered mental status.  DDx includes hepatic encephalopathy, infection, ICH, electrolyte abnormality, drug or etoh intoxication, withdrawal.   CT head completed shows no sign of intracranial bleed.  Labs show no significant electrolyte abnormalities.  CBC shows pancytopenia which is similar to prior, likely related to alcohol abuse.  She has  persistent transaminitis, however is improving from labs done in February, and bilirubin is  now normal.  Discussed all these results with patient.  On my history, she denies cough, shortness of breath, and she is afebrile in the emergency department, and have low suspicion for pneumonia.  She is afebrile, without nuchal rigidity, doubt meningitis. Possible gastroenteritis. No sign of acute intraabdominal pathology.   Alcohol level is 440, and suspect alcohol intoxication, abuse and dependence is likely etiology of her symptoms.    Discussed concerns regarding patient's alcoholism with her, her husband Celesta Gentile, and her daughter Benay Spice.  She reports she would like to stop drinking.  They are requesting librium and antabuse and discussed risks of drinking on these medications.  Given resources. She is clinically sober and appropriate for outpatient follow up.  Patient discharged in stable condition with understanding of reasons to return.   Final Clinical Impressions(s) / ED Diagnoses   Final diagnoses:  Alcohol dependence with acute alcoholic intoxication and delirium [facility])    ED Discharge Orders         Ordered    chlordiazePOXIDE (LIBRIUM) 25 MG capsule     11/22/18 0037    disulfiram (ANTABUSE) 250 MG tablet  Daily     11/22/18 0038           Gareth Morgan, MD 11/22/18 [phone]

## 2018-11-22 NOTE — ED Notes (Signed)
Pt is on the phone at this moment and will call out when she is ready to ambulate

## 2018-11-22 NOTE — ED Notes (Addendum)
Patient verbalizes understanding of medications and discharge instructions. No further questions at this time. Patient ambulatory and A&O x 4 at discharge.   Security escorted to waiting room.

## 2018-11-22 NOTE — ED Notes (Signed)
Patient ambulated in the hallway with minimal assistance. Steady gait noted at this time. Patient's husband calling for her a ride at this time.

## 2018-12-19 ENCOUNTER — Ambulatory Visit (HOSPITAL_COMMUNITY)
Admission: EM | Admit: 2018-12-19 | Discharge: 2018-12-19 | Disposition: A | Discharge status: Home / Self Care | Payer: No Typology Code available for payment source | Source: Ambulatory Visit | Attending: Emergency Medicine | Admitting: Emergency Medicine

## 2018-12-19 ENCOUNTER — Other Ambulatory Visit: Payer: Self-pay

## 2018-12-19 ENCOUNTER — Encounter (HOSPITAL_COMMUNITY): Payer: Self-pay

## 2018-12-19 ENCOUNTER — Emergency Department (HOSPITAL_COMMUNITY)
Admission: EM | Admit: 2018-12-19 | Discharge: 2018-12-19 | Disposition: A | Discharge status: Home / Self Care | Payer: Medicaid Other | Attending: Emergency Medicine | Admitting: Emergency Medicine

## 2018-12-19 DIAGNOSIS — Z0441 Encounter for examination and observation following alleged adult rape: Principal | ICD-10-CM

## 2018-12-19 DIAGNOSIS — Z79899 Other long term (current) drug therapy: Secondary | ICD-10-CM

## 2018-12-19 DIAGNOSIS — I10 Essential (primary) hypertension: Secondary | ICD-10-CM

## 2018-12-19 DIAGNOSIS — T7621XA Adult sexual abuse, suspected, initial encounter: Principal | ICD-10-CM

## 2018-12-19 DIAGNOSIS — T7421XA Adult sexual abuse, confirmed, initial encounter: Secondary | ICD-10-CM

## 2018-12-19 LAB — COMPREHENSIVE METABOLIC PANEL
ALT: 43 U/L (ref 0–44)
AST: 165 U/L — ABNORMAL HIGH (ref 15–41)
Albumin: 3.8 g/dL (ref 3.5–5.0)
Alkaline Phosphatase: 250 U/L — ABNORMAL HIGH (ref 38–126)
Anion gap: 18 — ABNORMAL HIGH (ref 5–15)
BUN: 8 mg/dL (ref 6–20)
CO2: 19 mmol/L — ABNORMAL LOW (ref 22–32)
Calcium: 8.7 mg/dL — ABNORMAL LOW (ref 8.9–10.3)
Chloride: 106 mmol/L (ref 98–111)
Creatinine, Ser: 0.45 mg/dL (ref 0.44–1.00)
GFR calc Af Amer: >60 mL/min (ref >60)
GFR calc non Af Amer: >60 mL/min (ref >60)
Glucose, Bld: 71 mg/dL (ref 70–99)
Potassium: 3.4 mmol/L — ABNORMAL LOW (ref 3.5–5.1)
Sodium: 143 mmol/L (ref 135–145)
Total Bilirubin: 0.8 mg/dL (ref 0.3–1.2)
Total Protein: 7.6 g/dL (ref 6.5–8.1)

## 2018-12-19 LAB — RAPID HIV SCREEN (HIV 1/2 AB+AG)
HIV 1/2 Antibodies: NONREACTIVE
HIV-1 P24 Antigen - HIV24: NONREACTIVE

## 2018-12-19 LAB — POC URINE PREG, ED: Preg Test, Ur: NEGATIVE

## 2018-12-19 MED ORDER — AZITHROMYCIN 250 MG PO TABS
1000.0000 mg | ORAL_TABLET | Freq: Once | ORAL | Status: AC
  Administered 2018-12-19: 17:00:00 1000 mg via ORAL
  Filled 2018-12-19: qty 4

## 2018-12-19 MED ORDER — LIDOCAINE HCL (PF) 1 % IJ SOLN: 0.9000 mL | Freq: Once | INTRAMUSCULAR | Status: DC

## 2018-12-19 MED ORDER — LIDOCAINE HCL (PF) 1 % IJ SOLN
INTRAMUSCULAR | Status: AC
  Administered 2018-12-19: 17:00:00 0.9 mL
  Filled 2018-12-19: qty 5

## 2018-12-19 MED ORDER — CEFTRIAXONE SODIUM 250 MG IJ SOLR
250.0000 mg | Freq: Once | INTRAMUSCULAR | Status: AC
  Administered 2018-12-19: 250 mg via INTRAMUSCULAR
  Filled 2018-12-19: qty 250

## 2018-12-19 MED ORDER — METRONIDAZOLE 500 MG PO TABS
2000.0000 mg | ORAL_TABLET | Freq: Once | ORAL | Status: AC
  Administered 2018-12-19: 2000 mg via ORAL
  Filled 2018-12-19: qty 4

## 2018-12-19 MED ORDER — ULIPRISTAL ACETATE 30 MG PO TABS
30.0000 mg | ORAL_TABLET | Freq: Once | ORAL | Status: AC
  Administered 2018-12-19: 30 mg via ORAL
  Filled 2018-12-19: qty 1
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
## 2018-12-19 NOTE — ED Notes (Signed)
Pt refused discharge vitals, pt refused discharge paperwork, EDP made aware.
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
## 2018-12-20 LAB — RPR: RPR Ser Ql: NONREACTIVE

## 2018-12-20 LAB — HEPATITIS B SURFACE ANTIGEN: Hepatitis B Surface Ag: NEGATIVE

## 2018-12-20 LAB — HEPATITIS C ANTIBODY: HCV Ab: <0.1 s/co ratio (ref 0.0–0.9)
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
## 2019-02-26 ENCOUNTER — Telehealth: Payer: Self-pay | Admitting: Family Medicine

## 2019-02-26 NOTE — Telephone Encounter (Signed)
Left voice mail to call back 

## 2019-02-26 NOTE — Telephone Encounter (Signed)
Patient returned call. She states that she has stopped drinking cold [place]. Last drink was about 1 week ago. She states that since she has stopped her eyes have been yellow & her urine has been discolored. Advised patient to be evaluated at the urgent care.

## 2019-02-26 NOTE — Telephone Encounter (Signed)
Patient called requesting to speak to a nurse, patient stated that she has yellow eyes and discoloration in her urine and has some general questions for the nurse, please follow up.

## 2019-03-04 ENCOUNTER — Other Ambulatory Visit: Payer: Self-pay

## 2019-03-04 ENCOUNTER — Encounter: Payer: Self-pay | Admitting: Family Medicine

## 2019-03-04 ENCOUNTER — Ambulatory Visit (INDEPENDENT_AMBULATORY_CARE_PROVIDER_SITE_OTHER): Payer: Medicaid Other | Admitting: Family Medicine

## 2019-03-04 DIAGNOSIS — R17 Unspecified jaundice: Secondary | ICD-10-CM

## 2019-03-04 DIAGNOSIS — F10988 Alcohol use, unspecified with other alcohol-induced disorder: Secondary | ICD-10-CM | POA: Diagnosis not present

## 2019-03-04 DIAGNOSIS — K7030 Alcoholic cirrhosis of liver without ascites: Secondary | ICD-10-CM | POA: Diagnosis not present

## 2019-03-04 DIAGNOSIS — K709 Alcoholic liver disease, unspecified: Secondary | ICD-10-CM

## 2019-03-04 DIAGNOSIS — Z20822 Contact with and (suspected) exposure to covid-19: Secondary | ICD-10-CM

## 2019-03-04 NOTE — Progress Notes (Deleted)
Called patient to initiate their telephone visit with provider Molli Barrows, FNP-C. Verified date of birth. Patient has c/o B yellow eyes. States that she has stopped drinking & is going to [employer]. Notes that she has had the yellowing before when she's stopped drinking but it's never lasted this long. Is also requesting a work note to go back to work. Has been taken out of work since Monday due to her eyes. KWalker, CMA.

## 2019-03-04 NOTE — Progress Notes (Signed)
Virtual Visit via Telephone Note  I connected with Joann [name] on 03/04/19 at  9:50 AM EDT by telephone and verified that I am speaking with the correct person using two identifiers.  Location: Patient: Located at home during today's encounter  Provider: Located at primary care office     I discussed the limitations, risks, security and privacy concerns of performing an evaluation and management service by telephone and the availability of in person appointments. I also discussed with the patient that there may be a patient responsible charge related to this service. The patient expressed understanding and agreed to proceed.   History of Present Illness: Joann [name] is present during today's telemedicine encounter with a complaint of worsening jaundice.  Joann [name] has a history of very heavy alcohol abuse and has been hospitalized several times for alcoholic intoxication.  Her most recent liver enzyme elevated AST 165 with normal  ALT. CT of the abdomen December 2019 revealed "Hepatomegaly with marked hepatic steatosis".  She complains of worsening yellowing of her eyes and clay colored stools. She reports that her urine has also changed colors and is more darker. She continues to drink although today reports that she has reduced her drinking to weekends only.  She is currently not in rehab. She is interested in referral for evaluation of her chronic liver disease. Previous screenings for hep B and hep C were negative.    Low grade Fever Patient works at [employer].  She reports that she attempted to go to work 1 day last week and upon being screened she had a low-grade fever.  She is excused from work and advised to obtain a tetanus prior to returning to work. Patient went [facility] and had a COVID testing results are available on care everywhere.  Her COVID 19 testing was negative. She is requesting a letter to return to work.  She is asymptomatic of cough, fever, chills,  body aches, sore throat, or headache.    Assessment and Plan: 1. Alcoholic liver disease ([place]) Am [name].  Patient has alcoholic liver disease as evidenced by a CT of the abdomen obtained back in December.  Unfortunately she continues to drink alcohol and currently experiencing symptoms of jaundice.  Hepatitis screenings B &C have both resulted as negative previously.    2. Jaundice See #1  3. Covid-19 Virus not Detected Work letter provided clearing patient to return to work.   Follow Up Instructions: As needed   I discussed the assessment and treatment plan with the patient. The patient was provided an opportunity to ask questions and all were answered. The patient agreed with the plan and demonstrated an understanding of the instructions.   The patient was advised to call back or seek an in-person evaluation if the symptoms worsen or if the condition fails to improve as anticipated.  I provided 25 minutes of non-face-to-face time during this encounter.   Molli Barrows, FNP-C

## 2019-03-05 ENCOUNTER — Telehealth: Payer: Self-pay | Admitting: Family Medicine

## 2019-03-05 NOTE — Telephone Encounter (Signed)
Please complete patient information and affixed a copy of patient's insurance card to referral for [facility].  Please also attached the following regarding patient's encounters. Please include the last 2 encounters here at primary care [place] Please include labs from 12/19/18-CMP, hep B and hep C along with the hospital discharge summary Also include- CMP from 11/21/2018 along with the hospital summary Include CT of the abdomen 08/29/2018 with referral.

## 2019-03-06 NOTE — Telephone Encounter (Signed)
Referral form & requested documentation(insurance, demographics, recent office notes, labs, imaging) faxed to [facility]([phone]).

## 2019-03-09 NOTE — Telephone Encounter (Signed)
Fax confirmation was received.

## 2019-04-02 ENCOUNTER — Encounter: Payer: Self-pay | Admitting: Emergency Medicine

## 2019-04-02 ENCOUNTER — Emergency Department: Payer: Medicaid Other

## 2019-04-02 ENCOUNTER — Other Ambulatory Visit: Payer: Self-pay

## 2019-04-02 ENCOUNTER — Inpatient Hospital Stay
Admission: EM | Admit: 2019-04-02 | Discharge: 2019-04-03 | DRG: 641 | Disposition: A | Discharge status: Home / Self Care | Payer: Medicaid Other | Attending: Internal Medicine | Admitting: Internal Medicine

## 2019-04-02 DIAGNOSIS — E872 Acidosis: Secondary | ICD-10-CM | POA: Diagnosis present

## 2019-04-02 DIAGNOSIS — K2920 Alcoholic gastritis without bleeding: Secondary | ICD-10-CM | POA: Diagnosis present

## 2019-04-02 DIAGNOSIS — Z20828 Contact with and (suspected) exposure to other viral communicable diseases: Secondary | ICD-10-CM | POA: Diagnosis present

## 2019-04-02 DIAGNOSIS — D6959 Other secondary thrombocytopenia: Secondary | ICD-10-CM | POA: Diagnosis present

## 2019-04-02 DIAGNOSIS — F10239 Alcohol dependence with withdrawal, unspecified: Secondary | ICD-10-CM | POA: Diagnosis present

## 2019-04-02 DIAGNOSIS — E871 Hypo-osmolality and hyponatremia: Secondary | ICD-10-CM | POA: Diagnosis present

## 2019-04-02 DIAGNOSIS — F419 Anxiety disorder, unspecified: Secondary | ICD-10-CM | POA: Diagnosis present

## 2019-04-02 DIAGNOSIS — I10 Essential (primary) hypertension: Secondary | ICD-10-CM | POA: Diagnosis present

## 2019-04-02 DIAGNOSIS — Z833 Family history of diabetes mellitus: Secondary | ICD-10-CM

## 2019-04-02 DIAGNOSIS — Z811 Family history of alcohol abuse and dependence: Secondary | ICD-10-CM | POA: Diagnosis not present

## 2019-04-02 DIAGNOSIS — Z8249 Family history of ischemic heart disease and other diseases of the circulatory system: Secondary | ICD-10-CM | POA: Diagnosis not present

## 2019-04-02 DIAGNOSIS — K7010 Alcoholic hepatitis without ascites: Secondary | ICD-10-CM | POA: Diagnosis present

## 2019-04-02 DIAGNOSIS — Z79899 Other long term (current) drug therapy: Secondary | ICD-10-CM | POA: Diagnosis not present

## 2019-04-02 DIAGNOSIS — E876 Hypokalemia: Principal | ICD-10-CM | POA: Diagnosis present

## 2019-04-02 DIAGNOSIS — D61818 Other pancytopenia: Secondary | ICD-10-CM | POA: Diagnosis present

## 2019-04-02 DIAGNOSIS — K760 Fatty (change of) liver, not elsewhere classified: Secondary | ICD-10-CM | POA: Diagnosis present

## 2019-04-02 DIAGNOSIS — R1011 Right upper quadrant pain: Secondary | ICD-10-CM

## 2019-04-02 HISTORY — DX: Liver disease, unspecified: K76.9

## 2019-04-02 HISTORY — DX: Hypokalemia: E87.6

## 2019-04-02 LAB — CBC
HCT: 29.6 % — ABNORMAL LOW (ref 36.0–46.0)
Hemoglobin: 10.2 g/dL — ABNORMAL LOW (ref 12.0–15.0)
MCH: 36.3 pg — ABNORMAL HIGH (ref 26.0–34.0)
MCHC: 34.5 g/dL (ref 30.0–36.0)
MCV: 105.3 fL — ABNORMAL HIGH (ref 80.0–100.0)
Platelets: 86 10*3/uL — ABNORMAL LOW (ref 150–400)
RBC: 2.81 MIL/uL — ABNORMAL LOW (ref 3.87–5.11)
RDW: 15.6 % — ABNORMAL HIGH (ref 11.5–15.5)
WBC: 3.8 10*3/uL — ABNORMAL LOW (ref 4.0–10.5)
nRBC: 0 % (ref 0.0–0.2)

## 2019-04-02 LAB — URINALYSIS, COMPLETE (UACMP) WITH MICROSCOPIC
Glucose, UA: NEGATIVE mg/dL
Ketones, ur: 5 mg/dL — AB
Leukocytes,Ua: NEGATIVE
Nitrite: NEGATIVE
Protein, ur: NEGATIVE mg/dL
Specific Gravity, Urine: 1.009 (ref 1.005–1.030)
pH: 7 (ref 5.0–8.0)

## 2019-04-02 LAB — COMPREHENSIVE METABOLIC PANEL
ALT: 85 U/L — ABNORMAL HIGH (ref 0–44)
AST: 620 U/L — ABNORMAL HIGH (ref 15–41)
Albumin: 3.6 g/dL (ref 3.5–5.0)
Alkaline Phosphatase: 297 U/L — ABNORMAL HIGH (ref 38–126)
Anion gap: 19 — ABNORMAL HIGH (ref 5–15)
BUN: 7 mg/dL (ref 6–20)
CO2: 20 mmol/L — ABNORMAL LOW (ref 22–32)
Calcium: 8.5 mg/dL — ABNORMAL LOW (ref 8.9–10.3)
Chloride: 94 mmol/L — ABNORMAL LOW (ref 98–111)
Creatinine, Ser: 0.45 mg/dL (ref 0.44–1.00)
GFR calc Af Amer: >60 mL/min (ref >60)
GFR calc non Af Amer: >60 mL/min (ref >60)
Glucose, Bld: 162 mg/dL — ABNORMAL HIGH (ref 70–99)
Potassium: 2.9 mmol/L — ABNORMAL LOW (ref 3.5–5.1)
Sodium: 133 mmol/L — ABNORMAL LOW (ref 135–145)
Total Bilirubin: 7 mg/dL — ABNORMAL HIGH (ref 0.3–1.2)
Total Protein: 7.8 g/dL (ref 6.5–8.1)

## 2019-04-02 LAB — MAGNESIUM: Magnesium: 1.4 mg/dL — ABNORMAL LOW (ref 1.7–2.4)

## 2019-04-02 LAB — PROTIME-INR
INR: 1.2 (ref 0.8–1.2)
Prothrombin Time: 15.5 seconds — ABNORMAL HIGH (ref 11.4–15.2)

## 2019-04-02 LAB — POCT PREGNANCY, URINE: Preg Test, Ur: NEGATIVE

## 2019-04-02 LAB — LIPASE, BLOOD: Lipase: 57 U/L — ABNORMAL HIGH (ref 11–51)

## 2019-04-02 LAB — BETA-HYDROXYBUTYRIC ACID: Beta-Hydroxybutyric Acid: 0.55 mmol/L — ABNORMAL HIGH (ref 0.05–0.27)

## 2019-04-02 LAB — SARS CORONAVIRUS 2 BY RT PCR (HOSPITAL ORDER, PERFORMED IN ~~LOC~~ HOSPITAL LAB): SARS Coronavirus 2: NEGATIVE

## 2019-04-02 MED ORDER — LORAZEPAM 2 MG/ML IJ SOLN
0.0000 mg | Freq: Four times a day (QID) | INTRAMUSCULAR | Status: DC
  Administered 2019-04-02 (×3): 1 mg via INTRAVENOUS
  Filled 2019-04-02 (×3): qty 1

## 2019-04-02 MED ORDER — BISACODYL 5 MG PO TBEC: 5.0000 mg | DELAYED_RELEASE_TABLET | Freq: Every day | ORAL | Status: DC | PRN

## 2019-04-02 MED ORDER — DIPHENHYDRAMINE HCL 25 MG PO CAPS
25.0000 mg | ORAL_CAPSULE | Freq: Once | ORAL | Status: AC
  Administered 2019-04-02: 25 mg via ORAL
  Filled 2019-04-02: qty 1

## 2019-04-02 MED ORDER — LORAZEPAM 2 MG PO TABS: 0.0000 mg | ORAL_TABLET | Freq: Four times a day (QID) | ORAL | Status: DC

## 2019-04-02 MED ORDER — LORAZEPAM 2 MG PO TABS: 0.0000 mg | ORAL_TABLET | Freq: Two times a day (BID) | ORAL | Status: DC

## 2019-04-02 MED ORDER — ACETAMINOPHEN 325 MG PO TABS
650.0000 mg | ORAL_TABLET | Freq: Four times a day (QID) | ORAL | Status: DC | PRN
  Administered 2019-04-03: 650 mg via ORAL
  Filled 2019-04-02: qty 2

## 2019-04-02 MED ORDER — SODIUM CHLORIDE 0.9 % IV BOLUS
1000.0000 mL | Freq: Once | INTRAVENOUS | Status: AC
  Administered 2019-04-02: 1000 mL via INTRAVENOUS

## 2019-04-02 MED ORDER — HEPARIN SODIUM (PORCINE) 5000 UNIT/ML IJ SOLN: 5000.0000 [IU] | Freq: Three times a day (TID) | INTRAMUSCULAR | Status: DC

## 2019-04-02 MED ORDER — TRAZODONE HCL 50 MG PO TABS
25.0000 mg | ORAL_TABLET | Freq: Every evening | ORAL | Status: DC | PRN
  Administered 2019-04-02: 25 mg via ORAL
  Filled 2019-04-02: qty 1

## 2019-04-02 MED ORDER — THIAMINE HCL 100 MG/ML IJ SOLN: 100.0000 mg | Freq: Every day | INTRAMUSCULAR | Status: DC

## 2019-04-02 MED ORDER — VITAMIN B-1 100 MG PO TABS
100.0000 mg | ORAL_TABLET | Freq: Every day | ORAL | Status: DC
  Administered 2019-04-02 – 2019-04-03 (×2): 100 mg via ORAL
  Filled 2019-04-02 (×2): qty 1

## 2019-04-02 MED ORDER — LORAZEPAM 2 MG/ML IJ SOLN: 0.0000 mg | Freq: Two times a day (BID) | INTRAMUSCULAR | Status: DC

## 2019-04-02 MED ORDER — POTASSIUM CHLORIDE 10 MEQ/100ML IV SOLN
10.0000 meq | INTRAVENOUS | Status: AC
  Administered 2019-04-02 (×4): 10 meq via INTRAVENOUS
  Filled 2019-04-02 (×2): qty 100

## 2019-04-02 MED ORDER — MAGNESIUM SULFATE 2 GM/50ML IV SOLN
2.0000 g | Freq: Once | INTRAVENOUS | Status: AC
  Administered 2019-04-02: 2 g via INTRAVENOUS
  Filled 2019-04-02: qty 50

## 2019-04-02 MED ORDER — ONDANSETRON HCL 4 MG/2ML IJ SOLN: 4.0000 mg | Freq: Four times a day (QID) | INTRAMUSCULAR | Status: DC | PRN

## 2019-04-02 MED ORDER — ONDANSETRON HCL 4 MG PO TABS: 4.0000 mg | ORAL_TABLET | Freq: Four times a day (QID) | ORAL | Status: DC | PRN

## 2019-04-02 MED ORDER — SODIUM CHLORIDE 0.9% FLUSH
3.0000 mL | Freq: Once | INTRAVENOUS | Status: AC
  Administered 2019-04-02: 3 mL via INTRAVENOUS

## 2019-04-02 MED ORDER — THIAMINE HCL 100 MG/ML IJ SOLN
Freq: Once | INTRAVENOUS | Status: AC
  Administered 2019-04-02: 20:00:00 via INTRAVENOUS
  Filled 2019-04-02: qty 1000

## 2019-04-02 MED ORDER — ACETAMINOPHEN 650 MG RE SUPP: 650.0000 mg | Freq: Four times a day (QID) | RECTAL | Status: DC | PRN

## 2019-04-02 MED ORDER — PANTOPRAZOLE SODIUM 40 MG IV SOLR
40.0000 mg | Freq: Once | INTRAVENOUS | Status: AC
  Administered 2019-04-02: 40 mg via INTRAVENOUS
  Filled 2019-04-02: qty 40

## 2019-04-02 MED ORDER — SODIUM CHLORIDE 0.9 % IV SOLN
INTRAVENOUS | Status: DC | PRN
  Administered 2019-04-02: 250 mL via INTRAVENOUS

## 2019-04-02 NOTE — ED Provider Notes (Signed)
[facility] Emergency Department Provider Note  ____________________________________________   First MD Initiated Contact with Patient 04/02/19 [phone]     (approximate)  I have reviewed the triage vital signs and the nursing notes.   HISTORY  Chief Complaint Cough and Abdominal Pain    HPI Joann [name] is a 38 y.o. female with alcohol abuse, liver disease who presents with upper abdominal pain.  Patient last had a drink 1 hour ago.  Patient drinks 2-64 ounces and some liquor daily.  Patient has had a history of seizures with withdrawal.  Patient is endorsing worsening yellowing of her eyes.  She is also had a cough for 1 month.  Patient also endorses upper abdominal pain that is been constant, worse with trying to eat, nothing makes it better, starts on the left upper quadrant and radiates over to the right upper quadrant.  She has had associated nausea and vomiting with it.  She says she is unable to tolerate food although she is been able to drink.  She reports that she wants to stop drinking alcohol.           Past Medical History:  Diagnosis Date   Alcohol abuse    Anxiety    Hypertension    Liver disease     Patient Active Problem List   Diagnosis Date Noted   Alcohol withdrawal ([place]) 10/24/2018   Alcohol dependence with withdrawal, unspecified ([place]) 10/24/2018   Elevated LFTs 10/24/2018   Thrombocytopenia ([place]) 10/24/2018   Alcohol-induced acute pancreatitis    Acute blood loss anemia    Alcoholic ketoacidosis 54/62/7035   Alcohol abuse with alcohol-induced mood disorder ([place]) 09/14/2017   Elevated liver enzymes 04/29/2017    Past Surgical History:  Procedure Laterality Date   ECTOPIC PREGNANCY SURGERY      Prior to Admission medications   Not on File    Allergies Patient has no known allergies.  Family History  Problem Relation Age of Onset   Healthy Mother    Alcohol abuse Mother        quit in her 51s    Alcohol abuse Father    Diabetes Father    Healthy Brother    Hypertension Maternal Grandmother    Hypertension Maternal Grandfather    Healthy Brother    Healthy Brother     Social History Social History   Tobacco Use   Smoking status: Never Smoker   Smokeless tobacco: Never Used  Substance Use Topics   Alcohol use: Yes    Comment: "I just do it"   Drug use: Never      Review of Systems Constitutional: No fever/chills Eyes: Scleral icterus ENT: No sore throat. Cardiovascular: Denies chest pain. Respiratory: Denies shortness of breath. Gastrointestinal: Abdominal pain and vomiting.  No diarrhea.  No constipation. Genitourinary: Negative for dysuria. Musculoskeletal: Negative for back pain. Skin: Negative for rash. Neurological: Negative for headaches, focal weakness or numbness. All other ROS negative ____________________________________________   PHYSICAL EXAM:  VITAL SIGNS: ED Triage Vitals  Enc Vitals Group     BP 04/02/19 0832 122/86     Pulse Rate 04/02/19 0832 97     Resp 04/02/19 0832 18     Temp 04/02/19 0832 99.1 F (37.3 C)     Temp Source 04/02/19 0832 Oral     SpO2 04/02/19 0832 99 %     Weight 04/02/19 0833 130 lb (59 kg)     Height 04/02/19 0833 5' 4"  (1.626 m)  Head Circumference --      Peak Flow --      Pain Score 04/02/19 0833 10     Pain Loc --      Pain Edu? --      Excl. in [place]? --     Constitutional: Alert and oriented. Well appearing and in no acute distress. Eyes: Scleral icterus EOMI. Head: Atraumatic. Nose: No congestion/rhinnorhea. Mouth/Throat: Mucous membranes are moist.   Neck: No stridor. Trachea Midline. FROM Cardiovascular: Normal rate, regular rhythm. Grossly normal heart sounds.  Good peripheral circulation. Respiratory: Normal respiratory effort.  No retractions. Lungs CTAB. Gastrointestinal: Soft with mild tenderness in the upper abdomen.  No rebound or guarding no distention. No abdominal bruits.    Musculoskeletal: No lower extremity tenderness nor edema.  No joint effusions. Neurologic:  Normal speech and language. No gross focal neurologic deficits are appreciated.  Skin:  Skin is warm, dry and intact. No rash noted. Psychiatric: Mood and affect are normal. Speech and behavior are normal. GU: Deferred   ____________________________________________   LABS (all labs ordered are listed, but only abnormal results are displayed)  Labs Reviewed  LIPASE, BLOOD - Abnormal; Notable for the following components:      Result Value   Lipase 57 (*)    All other components within normal limits  COMPREHENSIVE METABOLIC PANEL - Abnormal; Notable for the following components:   Sodium 133 (*)    Potassium 2.9 (*)    Chloride 94 (*)    CO2 20 (*)    Glucose, Bld 162 (*)    Calcium 8.5 (*)    AST 620 (*)    ALT 85 (*)    Alkaline Phosphatase 297 (*)    Total Bilirubin 7.0 (*)    Anion gap 19 (*)    All other components within normal limits  CBC - Abnormal; Notable for the following components:   WBC 3.8 (*)    RBC 2.81 (*)    Hemoglobin 10.2 (*)    HCT 29.6 (*)    MCV 105.3 (*)    MCH 36.3 (*)    RDW 15.6 (*)    Platelets 86 (*)    All other components within normal limits  URINALYSIS, COMPLETE (UACMP) WITH MICROSCOPIC - Abnormal; Notable for the following components:   Color, Urine AMBER (*)    APPearance HAZY (*)    Hgb urine dipstick MODERATE (*)    Bilirubin Urine SMALL (*)    Ketones, ur 5 (*)    Bacteria, UA MANY (*)    All other components within normal limits  MAGNESIUM - Abnormal; Notable for the following components:   Magnesium 1.4 (*)    All other components within normal limits  BETA-HYDROXYBUTYRIC ACID - Abnormal; Notable for the following components:   Beta-Hydroxybutyric Acid 0.55 (*)    All other components within normal limits  PROTIME-INR - Abnormal; Notable for the following components:   Prothrombin Time 15.5 (*)    All other components within normal  limits  SARS CORONAVIRUS 2 (HOSPITAL ORDER, [place] [facility])  POC URINE PREG, ED  POC URINE PREG, ED  POCT PREGNANCY, URINE   ____________________________________________   ED ECG REPORT I, Vanessa Castana, the attending physician, personally viewed and interpreted this ECG.  EKG normal sinus rate of 95, no ST elevation, no T wave inversion, normal intervals, normal axis ____________________________________________  RADIOLOGY Robert Bellow, personally viewed and evaluated these images (plain radiographs) as part of my  medical decision making, as well as reviewing the written report by the radiologist.  ED MD interpretation: Chest x-ray without pneumonia  Official radiology report(s): Dg Chest 1 View  Result Date: 04/02/2019 CLINICAL DATA:  Pt presents to ED via POV with c/o lower abdominal pain at this time. Pt states hx of liver disease and alcoholism, states last drink approx 1 hr ago. Pt c/o yellowing of her eyes, mild yellowing of sclera noted at this time. Nausea with LOWER abdominal pain. History of liver disease, hypertension, anxiety, nonsmoker. EXAM: CHEST  1 VIEW COMPARISON:  None. FINDINGS: The heart size and mediastinal contours are within normal limits. Both lungs are clear. The visualized skeletal structures are unremarkable. IMPRESSION: No active disease. Electronically Signed   By: Nolon Nations M.D.   On: 04/02/2019 09:33   US Abdomen Limited Ruq  Result Date: 04/02/2019 CLINICAL DATA:  Right upper quadrant pain.  Jaundice. EXAM: ULTRASOUND ABDOMEN LIMITED RIGHT UPPER QUADRANT COMPARISON:  CT scan dated 08/29/2018 FINDINGS: Gallbladder: Removed. Common bile duct: Diameter: 3 mm, normal. Liver: Coarse diffuse increased echogenicity enlarged liver, consistent with hepatic steatosis as previously demonstrated on CT. Portal vein is patent on color Doppler imaging with normal direction of blood flow towards the liver. IMPRESSION: Hepatomegaly with  hepatic steatosis.  No acute abnormality. Electronically Signed   By: Lorriane Shire M.D.   On: 04/02/2019 13:20    ____________________________________________   PROCEDURES  Procedure(s) performed (including Critical Care):  Procedures   ____________________________________________   INITIAL IMPRESSION / ASSESSMENT AND PLAN / ED COURSE  Joann [name] was evaluated in Emergency Department on 04/02/2019 for the symptoms described in the history of present illness. She was evaluated in the context of the global COVID-19 pandemic, which necessitated consideration that the patient might be at risk for infection with the SARS-CoV-2 virus that causes COVID-19. Institutional protocols and algorithms that pertain to the evaluation of patients at risk for COVID-19 are in a state of rapid change based on information released by regulatory bodies including the CDC and federal and state organizations. These policies and algorithms were followed during the patient's care in the ED.    Patient is a well-appearing 37 year old with upper abdominal pain and cough.  Will get labs to evaluate for liver dysfunction, pancreatitis.  Patient has no rebound or guarding that I think patient needs a CT scan at this time.  We will give a dose of Protonix to treat possible gastritis.  Will get labs to evaluate for electrolyte abnormalities, UTI, pancreatitis, alcoholic hepatitis.  Will do chest x-ray to evaluate for pneumonia.  No chest pain to suggest ACS but will get EKG given upper abdominal pain.      Lipase elevated at 57.  Patient has elevated AST and ALT 620/85 with a total bilirubin of 7.  This is most consistent with patient's history of alcoholic hepatitis.  Patient's last bilirubin was normal 3 months ago.  This most likely secondary to alcoholic hepatitis however will add on a ultrasound of upper quadrant to rule out gallstone pancreatitis.  Ultrasound no evidence of gallstone.  Given patient's electrolyte  abnormalities, T bilirubin of 7 and history of complicated withdrawal we will discussed with the medicine team for admission.  Patient admitted to the medicine team  ____________________________________________   FINAL CLINICAL IMPRESSION(S) / ED DIAGNOSES   Final diagnoses:  Right upper quadrant abdominal pain  Alcoholic hepatitis without ascites  Hypokalemia  Hypomagnesemia      MEDICATIONS GIVEN DURING THIS VISIT:  Medications  LORazepam (ATIVAN) injection 0-4 mg (1 mg Intravenous Given 04/02/19 1206)    Or  LORazepam (ATIVAN) tablet 0-4 mg ( Oral See Alternative 04/02/19 1206)  LORazepam (ATIVAN) injection 0-4 mg (has no administration in time range)    Or  LORazepam (ATIVAN) tablet 0-4 mg (has no administration in time range)  thiamine (VITAMIN B-1) tablet 100 mg (100 mg Oral Given 04/02/19 0930)    Or  thiamine (B-1) injection 100 mg ( Intravenous See Alternative 04/02/19 0930)  sodium chloride flush (NS) 0.9 % injection 3 mL (3 mLs Intravenous Given 04/02/19 1212)  sodium chloride 0.9 % bolus 1,000 mL (0 mLs Intravenous Stopped 04/02/19 1109)  pantoprazole (PROTONIX) injection 40 mg (40 mg Intravenous Given 04/02/19 0926)  diphenhydrAMINE (BENADRYL) capsule 25 mg (25 mg Oral Given 04/02/19 1113)     ED Discharge Orders    None       Note:  This document was prepared using Dragon voice recognition software and may include unintentional dictation errors.   Vanessa [place], MD 04/02/19 [phone]

## 2019-04-02 NOTE — ED Notes (Signed)
Patient transported to Ultrasound 

## 2019-04-02 NOTE — ED Triage Notes (Signed)
Pt presents to ED via pOV with c/o lower abdominal pain at this time. Pt states hx of liver disease and alcoholism, states last drink approx 1 hr ago. Pt c/o yellowing of her eyes, mild yellowing of sclera noted at this time. Pt c/o cough x 1 month with yellow phlegm. Pt also c/o nausea with lower abdominal pain at this time. Pt noted to be playing games on cell phone during triage.

## 2019-04-02 NOTE — ED Notes (Signed)
ED TO INPATIENT HANDOFF REPORT  ED Nurse Name and Phone #: [phone]  S Name/Age/Gender Joann [name] 38 y.o. female Room/Bed: ED25A/ED25A  Code Status   Code Status: Full Code  Home/SNF/Other Home Patient oriented x 4 Is this baseline?   Triage Complete: Triage complete  Chief Complaint chills  Triage Note Pt presents to ED via pOV with c/o lower abdominal pain at this time. Pt states hx of liver disease and alcoholism, states last drink approx 1 hr ago. Pt c/o yellowing of her eyes, mild yellowing of sclera noted at this time. Pt c/o cough x 1 month with yellow phlegm. Pt also c/o nausea with lower abdominal pain at this time. Pt noted to be playing games on cell phone during triage.    Allergies No Known Allergies  Level of Care/Admitting Diagnosis ED Disposition    ED Disposition Condition [place] [facility]: [place] [[id number]]  Level of Care: Med-Surg [16]  Covid Evaluation: Confirmed COVID Negative  Diagnosis: Hypokalemia due to inadequate potassium intake [[id number]]  Admitting Physician: Epifanio Lesches [[id number]]  Attending Physician: Epifanio Lesches [phone]  Estimated length of stay: past midnight tomorrow  Certification:: I certify this patient will need inpatient services for at least 2 midnights  PT Class (Do Not Modify): Inpatient [101]  PT Acc Code (Do Not Modify): Private [1]       B Medical/Surgery History Past Medical History:  Diagnosis Date  . Alcohol abuse   . Anxiety   . Hypertension   . Liver disease    Past Surgical History:  Procedure Laterality Date  . ECTOPIC PREGNANCY SURGERY       A IV Location/Drains/Wounds Patient Lines/Drains/Airways Status   Active Line/Drains/Airways    Name:   Placement date:   Placement time:   Site:   Days:   Peripheral IV 04/02/19 Left Antecubital   04/02/19    2751    Antecubital   less than 1          Intake/Output Last 24 hours  Intake/Output  Summary (Last 24 hours) at 04/02/2019 1623 Last data filed at 04/02/2019 1109 Gross per 24 hour  Intake 2011.94 ml  Output -  Net 2011.94 ml    Labs/Imaging Results for orders placed or performed during the hospital encounter of 04/02/19 (from the past 48 hour(s))  Urinalysis, Complete w Microscopic     Status: Abnormal   Collection Time: 04/02/19  8:35 AM  Result Value Ref Range   Color, Urine AMBER (A) YELLOW    Comment: BIOCHEMICALS MAY BE AFFECTED BY COLOR   APPearance HAZY (A) CLEAR   Specific Gravity, Urine 1.009 1.005 - 1.030   pH 7.0 5.0 - 8.0   Glucose, UA NEGATIVE NEGATIVE mg/dL   Hgb urine dipstick MODERATE (A) NEGATIVE   Bilirubin Urine SMALL (A) NEGATIVE   Ketones, ur 5 (A) NEGATIVE mg/dL   Protein, ur NEGATIVE NEGATIVE mg/dL   Nitrite NEGATIVE NEGATIVE   Leukocytes,Ua NEGATIVE NEGATIVE   RBC / HPF 0-5 0 - 5 RBC/hpf   WBC, UA 0-5 0 - 5 WBC/hpf   Bacteria, UA MANY (A) NONE SEEN   Squamous Epithelial / LPF 0-5 0 - 5   Mucus PRESENT     Comment: Performed at [facility], [address] [place], [place] [address]  Lipase, blood     Status: Abnormal   Collection Time: 04/02/19  8:42 AM  Result Value Ref Range   Lipase 57 (H) 11 -  51 U/L    Comment: Performed at [facility], [address] [place], [place] [address]  Comprehensive metabolic panel     Status: Abnormal   Collection Time: 04/02/19  8:42 AM  Result Value Ref Range   Sodium 133 (L) 135 - 145 mmol/L   Potassium 2.9 (L) 3.5 - 5.1 mmol/L   Chloride 94 (L) 98 - 111 mmol/L   CO2 20 (L) 22 - 32 mmol/L   Glucose, Bld 162 (H) 70 - 99 mg/dL   BUN 7 6 - 20 mg/dL   Creatinine, Ser 0.45 0.44 - 1.00 mg/dL   Calcium 8.5 (L) 8.9 - 10.3 mg/dL   Total Protein 7.8 6.5 - 8.1 g/dL   Albumin 3.6 3.5 - 5.0 g/dL   AST 620 (H) 15 - 41 U/L   ALT 85 (H) 0 - 44 U/L   Alkaline Phosphatase 297 (H) 38 - 126 U/L   Total Bilirubin 7.0 (H) 0.3 - 1.2 mg/dL   GFR calc non Af Amer >60 >60 mL/min   GFR calc  Af Amer >60 >60 mL/min   Anion gap 19 (H) 5 - 15    Comment: Performed at [facility], [address] [place], [place] [address]  CBC     Status: Abnormal   Collection Time: 04/02/19  8:42 AM  Result Value Ref Range   WBC 3.8 (L) 4.0 - 10.5 K/uL   RBC 2.81 (L) 3.87 - 5.11 MIL/uL   Hemoglobin 10.2 (L) 12.0 - 15.0 g/dL   HCT 29.6 (L) 36.0 - 46.0 %   MCV 105.3 (H) 80.0 - 100.0 fL   MCH 36.3 (H) 26.0 - 34.0 pg   MCHC 34.5 30.0 - 36.0 g/dL   RDW 15.6 (H) 11.5 - 15.5 %   Platelets 86 (L) 150 - 400 K/uL    Comment: PLATELET COUNT CONFIRMED BY SMEAR Immature Platelet Fraction may be clinically indicated, consider ordering this additional test [id number]    nRBC 0.0 0.0 - 0.2 %    Comment: Performed at [facility], [address] [place], [place] [address]  Magnesium     Status: Abnormal   Collection Time: 04/02/19  9:17 AM  Result Value Ref Range   Magnesium 1.4 (L) 1.7 - 2.4 mg/dL    Comment: Performed at [facility], [address] [place], [place] [address]  Beta-hydroxybutyric acid     Status: Abnormal   Collection Time: 04/02/19  9:17 AM  Result Value Ref Range   Beta-Hydroxybutyric Acid 0.55 (H) 0.05 - 0.27 mmol/L    Comment: Performed at [facility], [address] [place], [place] [address]  Protime-INR     Status: Abnormal   Collection Time: 04/02/19  9:17 AM  Result Value Ref Range   Prothrombin Time 15.5 (H) 11.4 - 15.2 seconds   INR 1.2 0.8 - 1.2    Comment: (NOTE) INR goal varies based on device and disease states. Performed at [facility], [address] [place], [place] [address]   SARS Coronavirus 2 [facility] order, Performed in [facility] hospital lab)     Status: None   Collection Time: 04/02/19 10:32 AM  Result Value Ref Range   SARS Coronavirus 2 NEGATIVE NEGATIVE    Comment: (NOTE) If result is NEGATIVE SARS-CoV-2 target nucleic acids are NOT DETECTED. The SARS-CoV-2 RNA is generally detectable  in upper and lower  respiratory specimens during the acute phase of infection. The lowest  concentration of SARS-CoV-2 viral  copies this assay can detect is 250  copies / mL. A negative result does not preclude SARS-CoV-2 infection  and should not be used as the sole basis for treatment or other  patient management decisions.  A negative result may occur with  improper specimen collection / handling, submission of specimen other  than nasopharyngeal swab, presence of viral mutation(s) within the  areas targeted by this assay, and inadequate number of viral copies  (<250 copies / mL). A negative result must be combined with clinical  observations, patient history, and epidemiological information. If result is POSITIVE SARS-CoV-2 target nucleic acids are DETECTED. The SARS-CoV-2 RNA is generally detectable in upper and lower  respiratory specimens dur ing the acute phase of infection.  Positive  results are indicative of active infection with SARS-CoV-2.  Clinical  correlation with patient history and other diagnostic information is  necessary to determine patient infection status.  Positive results do  not rule out bacterial infection or co-infection with other viruses. If result is PRESUMPTIVE POSTIVE SARS-CoV-2 nucleic acids MAY BE PRESENT.   A presumptive positive result was obtained on the submitted specimen  and confirmed on repeat testing.  While 2019 novel coronavirus  (SARS-CoV-2) nucleic acids may be present in the submitted sample  additional confirmatory testing may be necessary for epidemiological  and / or clinical management purposes  to differentiate between  SARS-CoV-2 and other Sarbecovirus currently known to infect humans.  If clinically indicated additional testing with an alternate test  methodology [phone]) is advised. The SARS-CoV-2 RNA is generally  detectable in upper and lower respiratory sp ecimens during the acute  phase of infection. The expected result is  Negative. Fact Sheet for Patients:  StrictlyIdeas.no Fact Sheet for Healthcare Providers: BankingDealers.co.za This test is not yet approved or cleared by the [place] FDA and has been authorized for detection and/or diagnosis of SARS-CoV-2 by FDA under an Emergency Use Authorization (EUA).  This EUA will remain in effect (meaning this test can be used) for the duration of the COVID-19 declaration under Section 564(b)(1) of the Act, 21 U.S.C. section 360bbb-3(b)(1), unless the authorization is terminated or revoked sooner. Performed at [facility], [address] [place], [place] [address]   Pregnancy, urine POC     Status: None   Collection Time: 04/02/19 12:19 PM  Result Value Ref Range   Preg Test, Ur NEGATIVE NEGATIVE    Comment:        THE SENSITIVITY OF THIS METHODOLOGY IS >24 mIU/mL    Dg Chest 1 View  Result Date: 04/02/2019 CLINICAL DATA:  Pt presents to ED via POV with c/o lower abdominal pain at this time. Pt states hx of liver disease and alcoholism, states last drink approx 1 hr ago. Pt c/o yellowing of her eyes, mild yellowing of sclera noted at this time. Nausea with LOWER abdominal pain. History of liver disease, hypertension, anxiety, nonsmoker. EXAM: CHEST  1 VIEW COMPARISON:  None. FINDINGS: The heart size and mediastinal contours are within normal limits. Both lungs are clear. The visualized skeletal structures are unremarkable. IMPRESSION: No active disease. Electronically Signed   By: Nolon Nations M.D.   On: 04/02/2019 09:33   US Abdomen Limited Ruq  Result Date: 04/02/2019 CLINICAL DATA:  Right upper quadrant pain.  Jaundice. EXAM: ULTRASOUND ABDOMEN LIMITED RIGHT UPPER QUADRANT COMPARISON:  CT scan dated 08/29/2018 FINDINGS: Gallbladder: Removed. Common bile duct: Diameter: 3 mm, normal. Liver: Coarse diffuse increased echogenicity enlarged liver, consistent with hepatic steatosis as  previously  demonstrated on CT. Portal vein is patent on color Doppler imaging with normal direction of blood flow towards the liver. IMPRESSION: Hepatomegaly with hepatic steatosis.  No acute abnormality. Electronically Signed   By: Lorriane Shire M.D.   On: 04/02/2019 13:20    Pending Labs Unresulted Labs (From admission, onward)    Start     Ordered   04/03/19 0500  Comprehensive metabolic panel  Tomorrow morning,   STAT     04/02/19 1420   Signed and Held  CBC  (heparin)  Once,   R    Comments: Baseline for heparin therapy IF NOT ALREADY DRAWN.  Notify MD if PLT < 100 K.    Signed and Held   Signed and Held  Creatinine, serum  (heparin)  Once,   R    Comments: Baseline for heparin therapy IF NOT ALREADY DRAWN.    Signed and Held   Signed and Held  Basic metabolic panel  Tomorrow morning,   R     Signed and Held   Signed and Held  CBC  Tomorrow morning,   R     Signed and Held          Vitals/Pain Today's Vitals   04/02/19 1230 04/02/19 1552 04/02/19 1553 04/02/19 1600  BP: 104/72  105/86 108/85  Pulse: 91     Resp: 17  18 16   Temp:      TempSrc:      SpO2: 100%     Weight:      Height:      PainSc:  0-No pain      Isolation Precautions No active isolations  Medications Medications  LORazepam (ATIVAN) injection 0-4 mg (1 mg Intravenous Given 04/02/19 1206)    Or  LORazepam (ATIVAN) tablet 0-4 mg ( Oral See Alternative 04/02/19 1206)  LORazepam (ATIVAN) injection 0-4 mg (has no administration in time range)    Or  LORazepam (ATIVAN) tablet 0-4 mg (has no administration in time range)  thiamine (VITAMIN B-1) tablet 100 mg (100 mg Oral Given 04/02/19 0930)    Or  thiamine (B-1) injection 100 mg ( Intravenous See Alternative 04/02/19 0930)  sodium chloride flush (NS) 0.9 % injection 3 mL (3 mLs Intravenous Given 04/02/19 1212)  sodium chloride 0.9 % bolus 1,000 mL (0 mLs Intravenous Stopped 04/02/19 1109)  pantoprazole (PROTONIX) injection 40 mg (40 mg Intravenous Given 04/02/19  0926)  diphenhydrAMINE (BENADRYL) capsule 25 mg (25 mg Oral Given 04/02/19 1113)    Mobility Low fall risk   Focused Assessments    R Recommendations: See Admitting Provider Note  Report given to:   Additional Notes:

## 2019-04-02 NOTE — ED Notes (Signed)
Pt complaining of itching at this time, no other withdrawal symptoms. MD notified.

## 2019-04-02 NOTE — ED Notes (Signed)
Dinner tray provided to pt at this time.

## 2019-04-02 NOTE — H&P (Signed)
[facility] [place] NAME: Joann [name]    MR#:  [id number]  DATE OF BIRTH:  09-Oct-1980  DATE OF ADMISSION:  04/02/2019  PRIMARY CARE PHYSICIAN: [name], FNP   REQUESTING/REFERRING PHYSICIAN: Dr. Marjean Donna  CHIEF COMPLAINT: Alcohol detox   Chief Complaint  Patient presents with  . Cough  . Abdominal Pain    HISTORY OF PRESENT ILLNESS:  Joann [name]  is a 38 y.o. female with a known history of alcohol abuse came in because of alcohol detox but found to have severe electrolyte derangement and also jaundiced.  Patient complains of upper quadrant abdominal pain that is been constant and not able to eat or drink for the last few days, patient had nausea, vomiting with it.  Heavy drinker drinks 2 beers a day and vodka for the last 13 years last drink was this morning.  Patient noticed jaundice since 1 week associated with dark urine.  Gets alcohol withdrawal symptoms.  Patient also has multiple other complaints like cough, abdominal pain, nausea, chest pain.  Did not have fever or shortness of breath.  PAST MEDICAL HISTORY:   Past Medical History:  Diagnosis Date  . Alcohol abuse   . Anxiety   . Hypertension   . Liver disease     PAST SURGICAL HISTOIRY:   Past Surgical History:  Procedure Laterality Date  . ECTOPIC PREGNANCY SURGERY      SOCIAL HISTORY:   Social History   Tobacco Use  . Smoking status: Never Smoker  . Smokeless tobacco: Never Used  Substance Use Topics  . Alcohol use: Yes    Comment: "I just do it"    FAMILY HISTORY:   Family History  Problem Relation Age of Onset  . Healthy Mother   . Alcohol abuse Mother        quit in her 4s  . Alcohol abuse Father   . Diabetes Father   . Healthy Brother   . Hypertension Maternal Grandmother   . Hypertension Maternal Grandfather   . Healthy Brother   . Healthy Brother     DRUG ALLERGIES:  No Known Allergies  REVIEW OF SYSTEMS:   CONSTITUTIONAL: No fever, fatigue or weakness.  EYES: No blurred or double vision.  EARS, NOSE, AND THROAT: No tinnitus or ear pain.  RESPIRATORY: Cough, body pains  CARDIOVASCULAR: No chest pain, orthopnea, edema.  GASTROINTESTINAL: Nausea, decreased p.o. intake, gastric abdominal pain, jaundice   gENITOURINARY: No dysuria, hematuria.  ENDOCRINE: No polyuria, nocturia,  HEMATOLOGY: No anemia, easy bruising or bleeding SKIN: No rash or lesion. MUSCULOSKELETAL: No joint pain or arthritis.   NEUROLOGIC: No tingling, numbness, weakness.  PSYCHIATRY: No anxiety or depression.   MEDICATIONS AT HOME:   Prior to Admission medications   Medication Sig Start Date End Date Taking? Authorizing Provider  cholecalciferol (VITAMIN D3) 25 MCG (1000 UT) tablet Take 1,000 Units by mouth daily.   Yes [provider]  Multiple Vitamin (MULTIVITAMIN WITH MINERALS) TABS tablet Take 1 tablet by mouth daily.   Yes [provider]      VITAL SIGNS:  Blood pressure 104/72, pulse 91, temperature 99.1 F (37.3 C), temperature source Oral, resp. rate 17, height 5' 4"  (1.626 m), weight 59 kg, last menstrual period 03/29/2019, SpO2 100 %.  PHYSICAL EXAMINATION:  GENERAL:  38 y.o.-year-old patient lying in the bed with no acute distress.  EYES: Pupils equal, round, reactive to light .   Jaundiced  HEENT: Head atraumatic, normocephalic. Oropharynx and nasopharynx clear.  NECK:  Supple, no jugular venous distention. No thyroid enlargement, no tenderness.  LUNGS: Normal breath sounds bilaterally, no wheezing, rales,rhonchi or crepitation. No use of accessory muscles of respiration.  CARDIOVASCULAR: S1, S2 normal. No murmurs, rubs, or gallops.  ABDOMEN: Epigastric tenderness present, no rebound tenderness  EXTREMITIES: No pedal edema, cyanosis, or clubbing.  NEUROLOGIC: Cranial nerves II through XII are intact. Muscle strength 5/5 in all extremities. Sensation intact. Gait not checked.   PSYCHIATRIC: The patient is alert and oriented x 3.  SKIN: No obvious rash, lesion, or ulcer.   LABORATORY PANEL:   CBC Recent Labs  Lab 04/02/19 0842  WBC 3.8*  HGB 10.2*  HCT 29.6*  PLT 86*   ------------------------------------------------------------------------------------------------------------------  Chemistries  Recent Labs  Lab 04/02/19 0842 04/02/19 0917  NA 133*  --   K 2.9*  --   CL 94*  --   CO2 20*  --   GLUCOSE 162*  --   BUN 7  --   CREATININE 0.45  --   CALCIUM 8.5*  --   MG  --  1.4*  AST 620*  --   ALT 85*  --   ALKPHOS 297*  --   BILITOT 7.0*  --    ------------------------------------------------------------------------------------------------------------------  Cardiac Enzymes No results for input(s): TROPONINI in the last 168 hours. ------------------------------------------------------------------------------------------------------------------  RADIOLOGY:  Dg Chest 1 View  Result Date: 04/02/2019 CLINICAL DATA:  Pt presents to ED via POV with c/o lower abdominal pain at this time. Pt states hx of liver disease and alcoholism, states last drink approx 1 hr ago. Pt c/o yellowing of her eyes, mild yellowing of sclera noted at this time. Nausea with LOWER abdominal pain. History of liver disease, hypertension, anxiety, nonsmoker. EXAM: CHEST  1 VIEW COMPARISON:  None. FINDINGS: The heart size and mediastinal contours are within normal limits. Both lungs are clear. The visualized skeletal structures are unremarkable. IMPRESSION: No active disease. Electronically Signed   By: Nolon Nations M.D.   On: 04/02/2019 09:33   US Abdomen Limited Ruq  Result Date: 04/02/2019 CLINICAL DATA:  Right upper quadrant pain.  Jaundice. EXAM: ULTRASOUND ABDOMEN LIMITED RIGHT UPPER QUADRANT COMPARISON:  CT scan dated 08/29/2018 FINDINGS: Gallbladder: Removed. Common bile duct: Diameter: 3 mm, normal. Liver: Coarse diffuse increased echogenicity enlarged liver,  consistent with hepatic steatosis as previously demonstrated on CT. Portal vein is patent on color Doppler imaging with normal direction of blood flow towards the liver. IMPRESSION: Hepatomegaly with hepatic steatosis.  No acute abnormality. Electronically Signed   By: Lorriane Shire M.D.   On: 04/02/2019 13:20    EKG:   Orders placed or performed during the hospital encounter of 04/02/19  . ED EKG  . ED EKG  . EKG 12-Lead  . EKG 12-Lead  Normal sinus rhythm at 95 bpm, no ST elevation, no T wave inversion with normal axis.  IMPRESSION AND PLAN:   38 year old female with heavy alcohol abuse comes in because of poor p.o. intake, abdominal pain, with bilirubin of 2 7.  #1 .alcoholic hepatitis jaundice and bilirubin up to 7 consistent with alcohol hepatitis heavy alcoholic, last alcohol drink this morning, blood alcohol level more than 400.  Consult gastroenterology to see if we can start steroids for her 2.  Abdominal pain, nausea likely due to alcoholic gastritis.  Start PPIs With IV nausea medicines. 3.  Severe hypokalemia, hypomagnesemia due to poor p.o. intake not able to drink and keep  anything down for the last 1 week, replace potassium, magnesium, continue IV thiamine, folic acid and IV fluids. 4.  IV alcohol abuse, counseled to quit, started on CIWA scale. 5.  Thrombocytopenia due to alcohol disease, advised to quit alcohol, patient came for detox today.,  History of alcohol abuse, admitted in April 2020 he records are reviewed and case discussed with ED provider. Management plans discussed with the patient, family and they are in agreement.  CODE STATUS: Full code  TOTAL TIME TAKING CARE OF THIS PATIENT: 55 minutes.    Epifanio Lesches M.D on 04/02/2019 at 3:25 PM  Between 7am to 6pm - Pager - [phone]  After 6pm go to [email or web address] - password [email or web address] [facility]  [facility]  Office  [phone]  CC: Primary care physician; [name], FNP  Note:  This dictation was prepared with Dragon dictation along with smaller phrase technology. Any transcriptional errors that result from this process are unintentional.

## 2019-04-02 NOTE — ED Notes (Signed)
Pt requesting food. Admitting MD notified, waiting for further instructions regarding pt diet at this time

## 2019-04-02 NOTE — ED Notes (Signed)
Pt requesting food, Snack and soda given to pt at this time

## 2019-04-03 DIAGNOSIS — K7010 Alcoholic hepatitis without ascites: Secondary | ICD-10-CM

## 2019-04-03 LAB — COMPREHENSIVE METABOLIC PANEL
ALT: 59 U/L — ABNORMAL HIGH (ref 0–44)
AST: 418 U/L — ABNORMAL HIGH (ref 15–41)
Albumin: 2.8 g/dL — ABNORMAL LOW (ref 3.5–5.0)
Alkaline Phosphatase: 232 U/L — ABNORMAL HIGH (ref 38–126)
Anion gap: 11 (ref 5–15)
BUN: 5 mg/dL — ABNORMAL LOW (ref 6–20)
CO2: 21 mmol/L — ABNORMAL LOW (ref 22–32)
Calcium: 7.7 mg/dL — ABNORMAL LOW (ref 8.9–10.3)
Chloride: 103 mmol/L (ref 98–111)
Creatinine, Ser: 0.66 mg/dL (ref 0.44–1.00)
GFR calc Af Amer: >60 mL/min (ref >60)
GFR calc non Af Amer: >60 mL/min (ref >60)
Glucose, Bld: 81 mg/dL (ref 70–99)
Potassium: 3.8 mmol/L (ref 3.5–5.1)
Sodium: 135 mmol/L (ref 135–145)
Total Bilirubin: 6.6 mg/dL — ABNORMAL HIGH (ref 0.3–1.2)
Total Protein: 6.1 g/dL — ABNORMAL LOW (ref 6.5–8.1)

## 2019-04-03 LAB — CBC
HCT: 25.5 % — ABNORMAL LOW (ref 36.0–46.0)
Hemoglobin: 8.5 g/dL — ABNORMAL LOW (ref 12.0–15.0)
MCH: 36 pg — ABNORMAL HIGH (ref 26.0–34.0)
MCHC: 33.3 g/dL (ref 30.0–36.0)
MCV: 108.1 fL — ABNORMAL HIGH (ref 80.0–100.0)
Platelets: 67 10*3/uL — ABNORMAL LOW (ref 150–400)
RBC: 2.36 MIL/uL — ABNORMAL LOW (ref 3.87–5.11)
RDW: 15.9 % — ABNORMAL HIGH (ref 11.5–15.5)
WBC: 3.2 10*3/uL — ABNORMAL LOW (ref 4.0–10.5)
nRBC: 0 % (ref 0.0–0.2)

## 2019-04-03 LAB — GLUCOSE, CAPILLARY: Glucose-Capillary: 83 mg/dL (ref 70–99)

## 2019-04-03 LAB — MAGNESIUM: Magnesium: 1.9 mg/dL (ref 1.7–2.4)

## 2019-04-03 MED ORDER — THIAMINE HCL 100 MG PO TABS: 100.0000 mg | ORAL_TABLET | Freq: Every day | ORAL | 0 refills | Status: DC

## 2019-04-03 MED ORDER — ENSURE ENLIVE PO LIQD: 237.0000 mL | Freq: Two times a day (BID) | ORAL | Status: DC

## 2019-04-03 MED ORDER — VITAMIN D 25 MCG (1000 UNIT) PO TABS
1000.0000 [IU] | ORAL_TABLET | Freq: Every day | ORAL | Status: DC
  Administered 2019-04-03: 1000 [IU] via ORAL
  Filled 2019-04-03: qty 3
  Filled 2019-04-03: qty 1

## 2019-04-03 MED ORDER — ADULT MULTIVITAMIN W/MINERALS CH
1.0000 | ORAL_TABLET | Freq: Every day | ORAL | Status: DC
  Administered 2019-04-03: 1 via ORAL
  Filled 2019-04-03: qty 1

## 2019-04-03 NOTE — Progress Notes (Signed)
Patient cleared for discharge to        Education complete. AVS printed. Discharge instructions given. All questions answered for patient clarification.  Prescriptions given, pharmacy verified. Outpatient resources provided.  IV removed.  Discharged to home via POV

## 2019-04-03 NOTE — Discharge Summary (Signed)
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 Alcohol abuse   . Anxiety   . Hypertension   . Liver disease     HOSPITAL COURSE:  Joann [name]  is a 38 y.o. female with a known history of alcohol abuse came in because of alcohol detox but found to have severe electrolyte derangement and also jaundiced.  Patient complains of upper quadrant abdominal pain that is been constant and not able to eat or drink for the last few days  1. Alcoholic ketoacidosis due to poor PO intake with hypokalemia and hyponatremia -patient received IV fluids with oral and IV K along with magnesium which has been depleted -anion gap normalized. Patient able to eat some. She appears hemodynamically stable  2. History of alcohol abuse and alcohol induced chronic liver disease with chronically elevated bilirubin, pancytopenia -patient has a follow-up appointment with [facility] from what she tells me in [place]. -sHe is strongly advised to abstain from drinking alcohol. -Patient currently is covering zero on ciwa -ultrasound abdomen shows hepatic steatosis  3. Abdominal pain suspected due to alcohol induced gastritis -is able to tolerate PO's.  4. Long-standing history of alcohol dependence. I discussed with psych nurse Gust Rung to see patient. -Patient has  attended a few [place] meetings. Will give her resources for alcohol related help out in the community prior to discharge.  Pt is expressing wishes to go home to be with her family and children.  CONSULTS OBTAINED:  Treatment Team:  Patrecia Pour, NP  DRUG ALLERGIES:  No Known Allergies  DISCHARGE MEDICATIONS:   Allergies as of 04/03/2019   No Known Allergies     Medication List    TAKE these medications   cholecalciferol 25 MCG (1000 UT) tablet Commonly known as: VITAMIN D3 Take 1,000 Units by mouth daily.   multivitamin with minerals Tabs tablet Take 1 tablet by mouth daily.   thiamine 100 MG tablet Take 1 tablet (100 mg total) by mouth daily. Start taking on: April 04, 2019       If you experience worsening of your admission symptoms, develop shortness of breath, life threatening emergency, suicidal or homicidal thoughts you must seek medical attention immediately by calling 911 or calling your MD immediately  if symptoms less severe.  You Must read complete instructions/literature along with all the possible adverse reactions/side effects for all the Medicines you take and that have been prescribed to you. Take any new Medicines after you have completely understood and accept all the possible adverse reactions/side effects.   Please note  You were cared for by a hospitalist during your hospital stay. If you have any questions about your discharge medications or the care you received while you were in the hospital after you are discharged, you can call the unit and asked to speak with the hospitalist on call if the hospitalist that took care of you is not available. Once you are discharged,  your primary care physician will handle any further medical issues. Please note that NO REFILLS for any discharge medications will be authorized once you are discharged, as it is imperative that you return to your primary care physician (or establish a relationship with a primary care  physician if you do not have one) for your aftercare needs so that they can reassess your need for medications and monitor your lab values. Today   SUBJECTIVE   I feel ok  VITAL SIGNS:  Blood pressure 107/85, pulse 87, temperature 98.2 F (36.8 C), temperature source Oral, resp. rate 16, height 5' 4"  (1.626 m), weight 64.6 kg, last menstrual period 03/29/2019, SpO2 100 %.  I/O:    Intake/Output Summary (Last 24 hours) at 04/03/2019 1502 Last data filed at 04/03/2019 1402 Gross per 24 hour  Intake 1823.69 ml  Output -  Net 1823.69 ml    PHYSICAL EXAMINATION:  GENERAL:  38 y.o.-year-old patient lying in the bed with no acute distress.  EYES: Pupils equal, round, reactive to light and accommodation. ++ chornic scleral icterus. Extraocular muscles intact. Pallor+ HEENT: Head atraumatic, normocephalic. Oropharynx and nasopharynx clear.  NECK:  Supple, no jugular venous distention. No thyroid enlargement, no tenderness.  LUNGS: Normal breath sounds bilaterally, no wheezing, rales,rhonchi or crepitation. No use of accessory muscles of respiration.  CARDIOVASCULAR: S1, S2 normal. No murmurs, rubs, or gallops.  ABDOMEN: Soft, non-tender, non-distended. Bowel sounds present. No organomegaly or mass.  EXTREMITIES: No pedal edema, cyanosis, or clubbing.  NEUROLOGIC: Cranial nerves II through XII are intact. Muscle strength 5/5 in all extremities. Sensation intact. Gait not checked. No tremors PSYCHIATRIC: The patient is alert and oriented x 3.  SKIN: No obvious rash, lesion, or ulcer.   DATA REVIEW:   CBC  Recent Labs  Lab 04/03/19 0442  WBC 3.2*  HGB 8.5*  HCT 25.5*  PLT 67*    Chemistries  Recent Labs  Lab 04/03/19 0442  NA 135  K 3.8  CL 103  CO2 21*  GLUCOSE 81  BUN 5*  CREATININE 0.66  CALCIUM 7.7*  MG 1.9  AST 418*  ALT 59*  ALKPHOS 232*  BILITOT 6.6*    Microbiology Results   Recent Results (from the past 240 hour(s))  SARS Coronavirus 2 [facility] order,  Performed in [place] hospital lab)     Status: None   Collection Time: 04/02/19 10:32 AM  Result Value Ref Range Status   SARS Coronavirus 2 NEGATIVE NEGATIVE Final    Comment: (NOTE) If result is NEGATIVE SARS-CoV-2 target nucleic acids are NOT DETECTED. The SARS-CoV-2 RNA is generally detectable in upper and lower  respiratory specimens during the acute phase of infection. The lowest  concentration of SARS-CoV-2 viral copies this assay can detect is 250  copies / mL. A negative result does not preclude SARS-CoV-2 infection  and should not be used as the sole basis for treatment or other  patient management decisions.  A negative result may occur with  improper specimen collection / handling, submission of specimen other  than nasopharyngeal swab, presence of viral mutation(s) within the  areas targeted by this assay, and inadequate number of viral copies  (<250 copies / mL). A negative result must be combined with clinical  observations, patient history, and epidemiological information. If result is POSITIVE SARS-CoV-2 target nucleic acids are DETECTED. The SARS-CoV-2 RNA is generally detectable in upper and lower  respiratory specimens dur ing the acute phase of infection.  Positive  results are indicative of  active infection with SARS-CoV-2.  Clinical  correlation with patient history and other diagnostic information is  necessary to determine patient infection status.  Positive results do  not rule out bacterial infection or co-infection with other viruses. If result is PRESUMPTIVE POSTIVE SARS-CoV-2 nucleic acids MAY BE PRESENT.   A presumptive positive result was obtained on the submitted specimen  and confirmed on repeat testing.  While 2019 novel coronavirus  (SARS-CoV-2) nucleic acids may be present in the submitted sample  additional confirmatory testing may be necessary for epidemiological  and / or clinical management purposes  to differentiate between  SARS-CoV-2  and other Sarbecovirus currently known to infect humans.  If clinically indicated additional testing with an alternate test  methodology [phone]) is advised. The SARS-CoV-2 RNA is generally  detectable in upper and lower respiratory sp ecimens during the acute  phase of infection. The expected result is Negative. Fact Sheet for Patients:  StrictlyIdeas.no Fact Sheet for Healthcare Providers: BankingDealers.co.za This test is not yet approved or cleared by the [place] FDA and has been authorized for detection and/or diagnosis of SARS-CoV-2 by FDA under an Emergency Use Authorization (EUA).  This EUA will remain in effect (meaning this test can be used) for the duration of the COVID-19 declaration under Section 564(b)(1) of the Act, 21 U.S.C. section 360bbb-3(b)(1), unless the authorization is terminated or revoked sooner. Performed at [facility], [address] [place], [place] [address]     RADIOLOGY:  Dg Chest 1 View  Result Date: 04/02/2019 CLINICAL DATA:  Pt presents to ED via POV with c/o lower abdominal pain at this time. Pt states hx of liver disease and alcoholism, states last drink approx 1 hr ago. Pt c/o yellowing of her eyes, mild yellowing of sclera noted at this time. Nausea with LOWER abdominal pain. History of liver disease, hypertension, anxiety, nonsmoker. EXAM: CHEST  1 VIEW COMPARISON:  None. FINDINGS: The heart size and mediastinal contours are within normal limits. Both lungs are clear. The visualized skeletal structures are unremarkable. IMPRESSION: No active disease. Electronically Signed   By: Nolon Nations M.D.   On: 04/02/2019 09:33   US Abdomen Limited Ruq  Result Date: 04/02/2019 CLINICAL DATA:  Right upper quadrant pain.  Jaundice. EXAM: ULTRASOUND ABDOMEN LIMITED RIGHT UPPER QUADRANT COMPARISON:  CT scan dated 08/29/2018 FINDINGS: Gallbladder: Removed. Common bile duct: Diameter: 3 mm,  normal. Liver: Coarse diffuse increased echogenicity enlarged liver, consistent with hepatic steatosis as previously demonstrated on CT. Portal vein is patent on color Doppler imaging with normal direction of blood flow towards the liver. IMPRESSION: Hepatomegaly with hepatic steatosis.  No acute abnormality. Electronically Signed   By: Lorriane Shire M.D.   On: 04/02/2019 13:20     CODE STATUS:     Code Status Orders  (From admission, onward)         Start     Ordered   04/02/19 1731  Full code  Continuous     04/02/19 1731        Code Status History    Date Active Date Inactive Code Status Order ID Comments User Context   04/02/2019 0906 04/02/2019 1731 Full Code [id number]  Vanessa Bright, MD ED   10/24/2018 1459 10/26/2018 1554 Full Code [id number]  Karmen Bongo, MD ED   08/28/2018 1324 09/01/2018 1353 Full Code [id number]  Lars Mage, MD ED   09/14/2017 0608 09/15/2017 1233 Full Code [id number]  Delora Fuel, MD ED   Advance Care Planning Activity  TOTAL TIME TAKING CARE OF THIS PATIENT: **40 minutes.    Fritzi Mandes M.D on 04/03/2019 at 3:02 PM  Between 7am to 6pm - Pager - [phone] After 6pm go to [email or web address] - password [email or web address] [facility]  Office  [phone]  CC: Primary care physician; [name], FNP

## 2019-04-03 NOTE — Progress Notes (Addendum)
Initial Nutrition Assessment  DOCUMENTATION CODES:   Not applicable  INTERVENTION:   Ensure Enlive po BID, each supplement provides 350 kcal and 20 grams of protein  MVI, folic acid and thiamine daily in setting of etoh abuse   Pt at high refeed risk; recommend monitor K, Mg and P labs daily   NUTRITION DIAGNOSIS:   Inadequate oral intake related to acute illness as evidenced by per patient/family report.  GOAL:   Patient will meet greater than or equal to 90% of their needs  MONITOR:   PO intake, Supplement acceptance, Labs, Weight trends, Skin, I & O's  REASON FOR ASSESSMENT:   Malnutrition Screening Tool    ASSESSMENT:   38 year old female with heavy alcohol abuse comes in because of poor p.o. intake, abdominal pain, with bilirubin of 2 7.  RD working remotely.  Pt reports poor appetite and oral intake for 1 week pta r/t nausea, vomiting and abdominal pain. RD suspects pt with poor appetite and oral intake at baseline r/t etoh abuse. Pt ate 100% of her breakfast today in hospital. RD will add supplements to help pt meet her estimated needs. Pt is at high refeed risk; recommend monitor K, Mg and P labs daily. Per chart, pt with 8lb(6%) weight loss in < 2 months; this is not really significant. Of note, pt with macrocytic anemia; would recommend check B12 and folate labs to r/o deficiency.    Medications reviewed and include: D3, MVI, thiamine  Labs reviewed: K 3.8 wnl, Mg 1.9 wnl, alkphos 232(H), AST 418(H), ALT 59(H) Wbc 3.2(L), Hgb 8.5(L), Hct 25.5(L), MCV 108.1(H), MCH 36.0(H)  Unable to complete Nutrition-Focused physical exam at this time.   Diet Order:   Diet Order            Diet regular Room service appropriate? Yes; Fluid consistency: Thin  Diet effective now             EDUCATION NEEDS:   No education needs have been identified at this time  Skin:  Skin Assessment: Reviewed RN Assessment  Last BM:  7/23  Height:   Ht Readings from Last 1  Encounters:  04/02/19 5' 4"  (1.626 m)    Weight:   Wt Readings from Last 1 Encounters:  04/03/19 64.6 kg    Ideal Body Weight:  54.5 kg  BMI:  Body mass index is 24.45 kg/m.  Estimated Nutritional Needs:   Kcal:  1600-1800kcal/day  Protein:  80-90g/day  Fluid:  >1.6L/day  Koleen Distance MS, RD, LDN Pager #- [phone] Office#- [phone] After Hours Pager: [phone]

## 2019-04-03 NOTE — Discharge Instructions (Addendum)
Patient is advised to abstain from drinking alcohol and follow up with Intensive Outpatient at [facility] for substance abuse:  [facility] & [facility] Mental health service in [place], [place] Address: [address], [place], [place] [address] Closes soon ? 5PM ? Caesar Bookman Phone: [phone]
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
## 2019-04-03 NOTE — Consult Note (Signed)
[facility] Consult   Reason for Consult:  Abdominal pain Referring Physician:  Dr Posey Pronto Patient Identification: Joann [name] MRN:  [id number] Principal Diagnosis: Alcoholic hepatitis Diagnosis:  Active Problems:   Hypokalemia due to inadequate potassium intake  Total Time spent with patient: 1 hour  Subjective:   Joann [name] is a 38 y.o. female patient admitted with alcoholic hepatitis issues.  Patient seen and evaluated in person.  She responded to this practitioner with "I'm doing ok."  She is interested in alcohol rehab and has been to several programs in the past including [facility] and [facility].  She stopped drinking "a lot" a month ago and now reports drinking too "small beers" a day.  Denies withdrawal symptoms today except some anxiety about discharge.  Denies suicidal/homicidal ideations, hallucinations.  Discussed options and decided on [facility] as they accept Medicaid and have intensive outpatient programs for substance abuse in the community where she lives.  Stable psychiatrically.  HPI:  On admission per the notes:  38 y.o. female with a known history of alcohol abuse came in because of alcohol detox but found to have severe electrolyte derangement and also jaundiced.  Patient complains of upper quadrant abdominal pain that is been constant and not able to eat or drink for the last few days, patient had nausea, vomiting with it.  Heavy drinker drinks 2 beers a day and vodka for the last 13 years last drink was this morning.  Patient noticed jaundice since 1 week associated with dark urine.  Gets alcohol withdrawal symptoms.  Patient also has multiple other complaints like cough, abdominal pain, nausea, chest pain.  Did not have fever or shortness of breath.  Past Psychiatric History: alcohol dependence  Risk to Self:  none Risk to Others:  none Prior Inpatient Therapy:  yes Prior Outpatient Therapy:  yes  Past Medical History:  Past Medical History:  Diagnosis Date   . Alcohol abuse   . Anxiety   . Hypertension   . Liver disease     Past Surgical History:  Procedure Laterality Date  . ECTOPIC PREGNANCY SURGERY     Family History:  Family History  Problem Relation Age of Onset  . Healthy Mother   . Alcohol abuse Mother        quit in her 72s  . Alcohol abuse Father   . Diabetes Father   . Healthy Brother   . Hypertension Maternal Grandmother   . Hypertension Maternal Grandfather   . Healthy Brother   . Healthy Brother    Family Psychiatric  History: see above Social History:  Social History   Substance and Sexual Activity  Alcohol Use Yes   Comment: "I just do it"     Social History   Substance and Sexual Activity  Drug Use Never    Social History   Socioeconomic History  . Marital status: Married    Spouse name: Not on file  . Number of children: Not on file  . Years of education: Not on file  . Highest education level: Not on file  Occupational History  . Occupation: nurse  Social Needs  . Financial resource strain: Not on file  . Food insecurity    Worry: Not on file    Inability: Not on file  . Transportation needs    Medical: Not on file    Non-medical: Not on file  Tobacco Use  . Smoking status: Never Smoker  . Smokeless tobacco: Never Used  Substance and Sexual Activity  .  Alcohol use: Yes    Comment: "I just do it"  . Drug use: Never  . Sexual activity: Not on file  Lifestyle  . Physical activity    Days per week: Not on file    Minutes per session: Not on file  . Stress: Not on file  Relationships  . Social Herbalist on phone: Not on file    Gets together: Not on file    Attends religious service: Not on file    Active member of club or organization: Not on file    Attends meetings of clubs or organizations: Not on file    Relationship status: Not on file  Other Topics Concern  . Not on file  Social History Narrative  . Not on file   Additional Social History:    Allergies:   No Known Allergies  Labs:  Results for orders placed or performed during the hospital encounter of 04/02/19 (from the past 48 hour(s))  Urinalysis, Complete w Microscopic     Status: Abnormal   Collection Time: 04/02/19  8:35 AM  Result Value Ref Range   Color, Urine AMBER (A) YELLOW    Comment: BIOCHEMICALS MAY BE AFFECTED BY COLOR   APPearance HAZY (A) CLEAR   Specific Gravity, Urine 1.009 1.005 - 1.030   pH 7.0 5.0 - 8.0   Glucose, UA NEGATIVE NEGATIVE mg/dL   Hgb urine dipstick MODERATE (A) NEGATIVE   Bilirubin Urine SMALL (A) NEGATIVE   Ketones, ur 5 (A) NEGATIVE mg/dL   Protein, ur NEGATIVE NEGATIVE mg/dL   Nitrite NEGATIVE NEGATIVE   Leukocytes,Ua NEGATIVE NEGATIVE   RBC / HPF 0-5 0 - 5 RBC/hpf   WBC, UA 0-5 0 - 5 WBC/hpf   Bacteria, UA MANY (A) NONE SEEN   Squamous Epithelial / LPF 0-5 0 - 5   Mucus PRESENT     Comment: Performed at [facility], [address] [place], [place] [address]  Lipase, blood     Status: Abnormal   Collection Time: 04/02/19  8:42 AM  Result Value Ref Range   Lipase 57 (H) 11 - 51 U/L    Comment: Performed at [facility], [address] [place], [place] [address]  Comprehensive metabolic panel     Status: Abnormal   Collection Time: 04/02/19  8:42 AM  Result Value Ref Range   Sodium 133 (L) 135 - 145 mmol/L   Potassium 2.9 (L) 3.5 - 5.1 mmol/L   Chloride 94 (L) 98 - 111 mmol/L   CO2 20 (L) 22 - 32 mmol/L   Glucose, Bld 162 (H) 70 - 99 mg/dL   BUN 7 6 - 20 mg/dL   Creatinine, Ser 0.45 0.44 - 1.00 mg/dL   Calcium 8.5 (L) 8.9 - 10.3 mg/dL   Total Protein 7.8 6.5 - 8.1 g/dL   Albumin 3.6 3.5 - 5.0 g/dL   AST 620 (H) 15 - 41 U/L   ALT 85 (H) 0 - 44 U/L   Alkaline Phosphatase 297 (H) 38 - 126 U/L   Total Bilirubin 7.0 (H) 0.3 - 1.2 mg/dL   GFR calc non Af Amer >60 >60 mL/min   GFR calc Af Amer >60 >60 mL/min   Anion gap 19 (H) 5 - 15    Comment: Performed at [facility], [address], [place],  [place] [address]  CBC     Status: Abnormal   Collection Time: 04/02/19  8:42 AM  Result Value Ref Range  WBC 3.8 (L) 4.0 - 10.5 K/uL   RBC 2.81 (L) 3.87 - 5.11 MIL/uL   Hemoglobin 10.2 (L) 12.0 - 15.0 g/dL   HCT 29.6 (L) 36.0 - 46.0 %   MCV 105.3 (H) 80.0 - 100.0 fL   MCH 36.3 (H) 26.0 - 34.0 pg   MCHC 34.5 30.0 - 36.0 g/dL   RDW 15.6 (H) 11.5 - 15.5 %   Platelets 86 (L) 150 - 400 K/uL    Comment: PLATELET COUNT CONFIRMED BY SMEAR Immature Platelet Fraction may be clinically indicated, consider ordering this additional test [id number]    nRBC 0.0 0.0 - 0.2 %    Comment: Performed at [facility], [address], [place], [place] [address]  Magnesium     Status: Abnormal   Collection Time: 04/02/19  9:17 AM  Result Value Ref Range   Magnesium 1.4 (L) 1.7 - 2.4 mg/dL    Comment: Performed at [facility], [address] [place], [place] [address]  Beta-hydroxybutyric acid     Status: Abnormal   Collection Time: 04/02/19  9:17 AM  Result Value Ref Range   Beta-Hydroxybutyric Acid 0.55 (H) 0.05 - 0.27 mmol/L    Comment: Performed at [facility], [address] [place], [place] [address]  Protime-INR     Status: Abnormal   Collection Time: 04/02/19  9:17 AM  Result Value Ref Range   Prothrombin Time 15.5 (H) 11.4 - 15.2 seconds   INR 1.2 0.8 - 1.2    Comment: (NOTE) INR goal varies based on device and disease states. Performed at [facility], [address] [place], [place] [address]   SARS Coronavirus 2 [facility] order, Performed in [facility] hospital lab)     Status: None   Collection Time: 04/02/19 10:32 AM  Result Value Ref Range   SARS Coronavirus 2 NEGATIVE NEGATIVE    Comment: (NOTE) If result is NEGATIVE SARS-CoV-2 target nucleic acids are NOT DETECTED. The SARS-CoV-2 RNA is generally detectable in upper and lower  respiratory specimens during the acute phase of infection. The lowest  concentration of SARS-CoV-2 viral copies  this assay can detect is 250  copies / mL. A negative result does not preclude SARS-CoV-2 infection  and should not be used as the sole basis for treatment or other  patient management decisions.  A negative result may occur with  improper specimen collection / handling, submission of specimen other  than nasopharyngeal swab, presence of viral mutation(s) within the  areas targeted by this assay, and inadequate number of viral copies  (<250 copies / mL). A negative result must be combined with clinical  observations, patient history, and epidemiological information. If result is POSITIVE SARS-CoV-2 target nucleic acids are DETECTED. The SARS-CoV-2 RNA is generally detectable in upper and lower  respiratory specimens dur ing the acute phase of infection.  Positive  results are indicative of active infection with SARS-CoV-2.  Clinical  correlation with patient history and other diagnostic information is  necessary to determine patient infection status.  Positive results do  not rule out bacterial infection or co-infection with other viruses. If result is PRESUMPTIVE POSTIVE SARS-CoV-2 nucleic acids MAY BE PRESENT.   A presumptive positive result was obtained on the submitted specimen  and confirmed on repeat testing.  While 2019 novel coronavirus  (SARS-CoV-2) nucleic acids may be present in the submitted sample  additional confirmatory testing may be necessary for epidemiological  and / or clinical management purposes  to differentiate between  SARS-CoV-2 and other Sarbecovirus currently known to infect humans.  If clinically indicated additional testing with an alternate test  methodology [phone]) is advised. The SARS-CoV-2 RNA is generally  detectable in upper and lower respiratory sp ecimens during the acute  phase of infection. The expected result is Negative. Fact Sheet for Patients:  StrictlyIdeas.no Fact Sheet for Healthcare  Providers: BankingDealers.co.za This test is not yet approved or cleared by the [place] FDA and has been authorized for detection and/or diagnosis of SARS-CoV-2 by FDA under an Emergency Use Authorization (EUA).  This EUA will remain in effect (meaning this test can be used) for the duration of the COVID-19 declaration under Section 564(b)(1) of the Act, 21 U.S.C. section 360bbb-3(b)(1), unless the authorization is terminated or revoked sooner. Performed at [facility], [address] [place], [place] [address]   Pregnancy, urine POC     Status: None   Collection Time: 04/02/19 12:19 PM  Result Value Ref Range   Preg Test, Ur NEGATIVE NEGATIVE    Comment:        THE SENSITIVITY OF THIS METHODOLOGY IS >24 mIU/mL   Comprehensive metabolic panel     Status: Abnormal   Collection Time: 04/03/19  4:42 AM  Result Value Ref Range   Sodium 135 135 - 145 mmol/L   Potassium 3.8 3.5 - 5.1 mmol/L   Chloride 103 98 - 111 mmol/L   CO2 21 (L) 22 - 32 mmol/L   Glucose, Bld 81 70 - 99 mg/dL   BUN 5 (L) 6 - 20 mg/dL   Creatinine, Ser 0.66 0.44 - 1.00 mg/dL   Calcium 7.7 (L) 8.9 - 10.3 mg/dL   Total Protein 6.1 (L) 6.5 - 8.1 g/dL   Albumin 2.8 (L) 3.5 - 5.0 g/dL   AST 418 (H) 15 - 41 U/L   ALT 59 (H) 0 - 44 U/L   Alkaline Phosphatase 232 (H) 38 - 126 U/L   Total Bilirubin 6.6 (H) 0.3 - 1.2 mg/dL   GFR calc non Af Amer >60 >60 mL/min   GFR calc Af Amer >60 >60 mL/min   Anion gap 11 5 - 15    Comment: Performed at [facility], [address] [place], [place] [address]  CBC     Status: Abnormal   Collection Time: 04/03/19  4:42 AM  Result Value Ref Range   WBC 3.2 (L) 4.0 - 10.5 K/uL   RBC 2.36 (L) 3.87 - 5.11 MIL/uL   Hemoglobin 8.5 (L) 12.0 - 15.0 g/dL   HCT 25.5 (L) 36.0 - 46.0 %   MCV 108.1 (H) 80.0 - 100.0 fL   MCH 36.0 (H) 26.0 - 34.0 pg   MCHC 33.3 30.0 - 36.0 g/dL   RDW 15.9 (H) 11.5 - 15.5 %   Platelets 67 (L) 150 - 400 K/uL     Comment: Immature Platelet Fraction may be clinically indicated, consider ordering this additional test [id number] CONSISTENT WITH PREVIOUS RESULT    nRBC 0.0 0.0 - 0.2 %    Comment: Performed at [facility], [address] [place], [place] [address]  Magnesium     Status: None   Collection Time: 04/03/19  4:42 AM  Result Value Ref Range   Magnesium 1.9 1.7 - 2.4 mg/dL    Comment: Performed at [facility], [address] [place], [place] [address]  Glucose, capillary     Status: None   Collection Time: 04/03/19  8:00 AM  Result Value Ref  Range   Glucose-Capillary 83 70 - 99 mg/dL   Comment 1 Notify RN     Current Facility-Administered Medications  Medication Dose Route Frequency Provider Last Rate Last Dose  . 0.9 %  sodium chloride infusion   Intravenous PRN Epifanio Lesches, MD   Stopped at 04/02/19 1927  . acetaminophen (TYLENOL) tablet 650 mg  650 mg Oral Q6H PRN Epifanio Lesches, MD   650 mg at 04/03/19 1306   Or  . acetaminophen (TYLENOL) suppository 650 mg  650 mg Rectal Q6H PRN Epifanio Lesches, MD      . bisacodyl (DULCOLAX) EC tablet 5 mg  5 mg Oral Daily PRN Epifanio Lesches, MD      . cholecalciferol (VITAMIN D3) tablet 1,000 Units  1,000 Units Oral Daily Fritzi Mandes, MD   1,000 Units at 04/03/19 1304  . feeding supplement (ENSURE ENLIVE) (ENSURE ENLIVE) liquid 237 mL  237 mL Oral BID BM Fritzi Mandes, MD      . LORazepam (ATIVAN) injection 0-4 mg  0-4 mg Intravenous Q6H Epifanio Lesches, MD   1 mg at 04/02/19 2057   Or  . LORazepam (ATIVAN) tablet 0-4 mg  0-4 mg Oral Q6H Epifanio Lesches, MD      . Derrill Memo ON 04/04/2019] LORazepam (ATIVAN) injection 0-4 mg  0-4 mg Intravenous Q12H Epifanio Lesches, MD       Or  . Derrill Memo ON 04/04/2019] LORazepam (ATIVAN) tablet 0-4 mg  0-4 mg Oral Q12H Epifanio Lesches, MD      . multivitamin with minerals tablet 1 tablet  1 tablet Oral Daily Fritzi Mandes, MD   1 tablet at 04/03/19 1303   . ondansetron (ZOFRAN) tablet 4 mg  4 mg Oral Q6H PRN Epifanio Lesches, MD       Or  . ondansetron (ZOFRAN) injection 4 mg  4 mg Intravenous Q6H PRN Epifanio Lesches, MD      . thiamine (VITAMIN B-1) tablet 100 mg  100 mg Oral Daily Vanessa Vesta, MD   100 mg at 04/03/19 0935   Or  . thiamine (B-1) injection 100 mg  100 mg Intravenous Daily Vanessa Gadsden, MD      . traZODone (DESYREL) tablet 25 mg  25 mg Oral QHS PRN Epifanio Lesches, MD   25 mg at 04/02/19 2057    Musculoskeletal: Strength & Muscle Tone: within normal limits Gait & Station: normal Patient leans: N/A  Psychiatric Specialty Exam: Physical Exam  Nursing note and vitals reviewed. Constitutional: She is oriented to person, place, and time. She appears well-developed and well-nourished.  HENT:  Head: Normocephalic.  Neck: Normal range of motion.  Respiratory: Effort normal.  Musculoskeletal: Normal range of motion.  Neurological: She is alert and oriented to person, place, and time.  Psychiatric: Her speech is normal and behavior is normal. Judgment and thought content normal. Her mood appears anxious. Her affect is blunt. Cognition and memory are normal.    Review of Systems  Constitutional: Positive for malaise/fatigue.  Gastrointestinal: Positive for abdominal pain.  Psychiatric/Behavioral: Positive for substance abuse. The patient is nervous/anxious.   All other systems reviewed and are negative.   Blood pressure 107/85, pulse 87, temperature 98.2 F (36.8 C), temperature source Oral, resp. rate 16, height 5' 4"  (1.626 m), weight 64.6 kg, last menstrual period 03/29/2019, SpO2 100 %.Body mass index is 24.45 kg/m.  General Appearance: Casual  Eye Contact:  Good  Speech:  Normal Rate  Volume:  Normal  Mood:  Anxious  Affect:  Blunt  Thought Process:  Coherent and Descriptions of Associations: Intact  Orientation:  Full (Time, Place, and Person)  Thought Content:  WDL and Logical  Suicidal  Thoughts:  No  Homicidal Thoughts:  No  Memory:  Immediate;   Good Recent;   Good Remote;   Good  Judgement:  Fair  Insight:  Good  Psychomotor Activity:  Decreased  Concentration:  Concentration: Good and Attention Span: Good  Recall:  Good  Fund of Knowledge:  Fair  Language:  Good  Akathisia:  No  Handed:  Right  AIMS (if indicated):     Assets:  Housing Leisure Time Resilience Social Support  ADL's:  Intact  Cognition:  WNL  Sleep:        Treatment Plan Summary: Alcohol dependence with uncomplicated withdrawal: -Resources provided for [facility] substance abuse intensive outpatient program -Refrain from alcohol consumption  Insomnia: -Continued Trazodone 25 mg at bedtime PRN  Disposition: No evidence of imminent risk to self or others at present.   Supportive therapy provided about ongoing stressors. Discussed crisis plan, support from social network, calling 911, coming to the Emergency Department, and calling Suicide Hotline.  Waylan Boga, NP 04/03/2019 4:14 PM

## 2019-04-08 NOTE — Telephone Encounter (Signed)
Called to follow up on this referral. Spoke with Saralyn Pilar. Patient is scheduled 04/13/2019 @ 1:30 PM.

## 2019-04-13 ENCOUNTER — Inpatient Hospital Stay: Payer: Medicaid Other | Admitting: Family Medicine

## 2019-04-14 ENCOUNTER — Encounter: Payer: Self-pay | Admitting: Internal Medicine

## 2019-04-14 ENCOUNTER — Ambulatory Visit (INDEPENDENT_AMBULATORY_CARE_PROVIDER_SITE_OTHER): Payer: Medicaid Other | Admitting: Internal Medicine

## 2019-04-14 ENCOUNTER — Other Ambulatory Visit: Payer: Self-pay

## 2019-04-14 DIAGNOSIS — F1020 Alcohol dependence, uncomplicated: Secondary | ICD-10-CM

## 2019-04-14 DIAGNOSIS — K709 Alcoholic liver disease, unspecified: Secondary | ICD-10-CM | POA: Diagnosis not present

## 2019-04-14 NOTE — Progress Notes (Signed)
Patient following up on her recent hospital stay. Did follow up with [facility] on 04/13/2019.

## 2019-04-14 NOTE — Progress Notes (Signed)
Virtual Visit via Telephone Note Due to current restrictions/limitations of in-office visits due to the COVID-19 pandemic, this scheduled clinical appointment was converted to a telehealth visit  I connected with Beckie Busing on 04/14/19 at 4:48 p.m by telephone and verified that I am speaking with the correct person using two identifiers. I am in my office.  The patient is at home.  The patient, her husband and myself participated in this encounter.  I discussed the limitations, risks, security and privacy concerns of performing an evaluation and management service by telephone and the availability of in person appointments. I also discussed with the patient that there may be a patient responsible charge related to this service. The patient expressed understanding and agreed to proceed.   History of Present Illness: Pt with hx of ETOH use disorder severe, ETOH liver ds, alcoholic pancreatitis, pancytopenia, history of EtOH withdrawal seizures.Joann [name]  Purpose of today's visit is for hospital follow-up.  Patient hospitalized 7/23-24/2020 with electrolyte abnormalities due to alcohol abuse, alcoholic ketoacidosis, alcoholic liver disease with significant elevation in liver enzymes including bilirubin.  Patient went to the hospital for alcohol detox but was found to have severe electrolyte derangements and jaundice.  Electrolyte abnormalities included hypokalemia, hypomagnesemia.  Potassium and magnesium were replaced.  The anion gap is normalized.  Once she was stable, patient was discharged but was encouraged to seek long-term treatment EtOH abuse. She was seen by NP Roosevelt Locks with [facility] yesterday.  Patient was strongly advised use consider enrollment alcohol detox program followed by outpatient intensive therapy.  Today: Patient tells me that she is really trying to quit drinking so that she can get her life back.  Previously she was drinking 1 gallon of vodka along with 1.5 48 oz bottles of  beer a day.  She is now down to 1 can of 8 ounce beer a day for the past 3 days.  She admits to feeling shaky and having cravings. -Wanting to know if there is a medication that I can give her help decrease detox symptoms cravings. -Husband states that outpatient programs like [facility] will not take her because " you guys have on her chart that she had seizures but she only had seizures when she had withdrawal."  When asked to explain this, patient states that she had gone to [facility] September of last year for outpatient treatment but had a seizure due to withdrawal.  She was sent to [facility].  Even though the seizure was due to EtOH withdrawal, husband reports that [facility] would not take her back and she is also had problems getting into [facility]. -She will be touching base with [facility] the first part of next week.  Outpatient Encounter Medications as of 04/14/2019  Medication Sig  . cholecalciferol (VITAMIN D3) 25 MCG (1000 UT) tablet Take 1,000 Units by mouth daily.  . Multiple Vitamin (MULTIVITAMIN WITH MINERALS) TABS tablet Take 1 tablet by mouth daily.  Joann [name] thiamine 100 MG tablet Take 1 tablet (100 mg total) by mouth daily.   No facility-administered encounter medications on file as of 04/14/2019.       Observations/Objective:   Chemistry      Component Value Date/Time   NA 135 04/03/2019 0442   NA 136 09/29/2018 1523   K 3.8 04/03/2019 0442   CL 103 04/03/2019 0442   CO2 21 (L) 04/03/2019 0442   BUN 5 (L) 04/03/2019 0442   BUN 10 09/29/2018 1523   CREATININE  0.66 04/03/2019 0442      Component Value Date/Time   CALCIUM 7.7 (L) 04/03/2019 0442   ALKPHOS 232 (H) 04/03/2019 0442   AST 418 (H) 04/03/2019 0442   ALT 59 (H) 04/03/2019 0442   BILITOT 6.6 (H) 04/03/2019 0442   BILITOT 0.8 09/29/2018 1523     Lab Results  Component Value Date   WBC 3.2 (L) 04/03/2019   HGB 8.5 (L) 04/03/2019   HCT 25.5 (L) 04/03/2019   MCV 108.1 (H) 04/03/2019   PLT 67 (L)  04/03/2019     Assessment and Plan: 1. Alcohol use disorder, severe, dependence ([place]) 2. Alcoholic liver disease ([place]) -Discussed with patient and husband's and urgency of her trying to quit drinking.  Best way to do this is medically supervised alcohol detox then going into a long-term treatment program.  Not a good candidate for Campral or naltrexone to decrease cravings given the degree and severity of her liver disease. -I did speak with our LCSW who will plan on calling the patient tomorrow to give information about inpatient detox through [facility] regional or another program that is in [place]. -Patient is agreeable to this.  Our LCSW will touch base with them tomorrow. Follow Up Instructions: F/u in 1 mth   I discussed the assessment and treatment plan with the patient. The patient was provided an opportunity to ask questions and all were answered. The patient agreed with the plan and demonstrated an understanding of the instructions.   The patient was advised to call back or seek an in-person evaluation if the symptoms worsen or if the condition fails to improve as anticipated.  I provided 25 minutes of non-face-to-face time during this encounter.   Karle Plumber, MD

## 2019-04-15 ENCOUNTER — Inpatient Hospital Stay: Payer: Self-pay | Admitting: Nurse Practitioner

## 2019-04-15 ENCOUNTER — Telehealth: Payer: Self-pay | Admitting: Licensed Clinical Social Worker

## 2019-04-16 NOTE — Telephone Encounter (Signed)
Call placed to patient to schedule consult appointment. Appointment scheduled for 04/20/2019.

## 2019-04-20 ENCOUNTER — Ambulatory Visit: Payer: Medicaid Other | Attending: Family Medicine | Admitting: Licensed Clinical Social Worker

## 2019-04-20 ENCOUNTER — Other Ambulatory Visit: Payer: Self-pay

## 2019-04-20 DIAGNOSIS — Z789 Other specified health status: Secondary | ICD-10-CM

## 2019-04-20 DIAGNOSIS — F10288 Alcohol dependence with other alcohol-induced disorder: Secondary | ICD-10-CM | POA: Diagnosis not present

## 2019-04-20 DIAGNOSIS — F3289 Other specified depressive episodes: Secondary | ICD-10-CM

## 2019-04-20 DIAGNOSIS — F419 Anxiety disorder, unspecified: Secondary | ICD-10-CM

## 2019-04-20 DIAGNOSIS — Z7289 Other problems related to lifestyle: Secondary | ICD-10-CM

## 2019-04-24 NOTE — BH Specialist Note (Signed)
Integrated Behavioral Health Visit via Telemedicine (Telephone)  04/20/2019 Joann [name] [id number]   Session Start time: 4:10 PM  Session End time: 4:25 PM Total time: 15 minutes  Referring Provider: Dr. Wynetta Emery Type of Visit: Telephonic Patient location: Home [facility] Provider location: Office All persons participating in visit: LCSW and patient  Confirmed patient's address: Yes  Confirmed patient's phone number: Yes  Any changes to demographics: No   Confirmed patient's insurance: Yes  Any changes to patient's insurance: No   Discussed confidentiality: Yes    The following statements were read to the patient and/or legal guardian that are established with the [facility] Provider.  "The purpose of this phone visit is to provide behavioral health care while limiting exposure to the coronavirus (COVID19).  There is a possibility of technology failure and discussed alternative modes of communication if that failure occurs."  "By engaging in this telephone visit, you consent to the provision of healthcare.  Additionally, you authorize for your insurance to be billed for the services provided during this telephone visit."   Patient and/or legal guardian consented to telephone visit: Yes   PRESENTING CONCERNS: Patient and/or family reports the following symptoms/concerns: Pt reports hx of substance use (alcohol) She has detoxed and participating in medication management with Librium to assist with withdrawals. She reports sobriety for approx 1-2 weeks  Duration of problem: Ongoing; Severity of problem: moderate  STRENGTHS (Protective Factors/Coping Skills): Pt reports strong support system consisting of family and friends Pt participates in [place] meetings virtually and is making new friends strengthening support system Pt reports healthy coping skills (running errands, spending time with son, etc)  GOALS ADDRESSED: Patient will: 1.  Reduce symptoms of: anxiety and depression   2.  Increase knowledge and/or ability of: coping skills  3.  Demonstrate ability to: Decrease self-medicating behaviors  INTERVENTIONS: Interventions utilized:  Solution-Focused Strategies, Supportive Counseling and Psychoeducation and/or Health Education Standardized Assessments completed: Not Needed  ASSESSMENT: Patient reported hx of depression and anxiety triggered by ongoing substance use (alcohol) She has detoxed and participating in medication management with Librium to assist with withdrawals. She reports sobriety for approx 1-2 weeks and implementation of healthy coping skills, including, spending time with family, participating in [employer], and strengthening support system.     Patient is not currently interested in any resources or substance use resources. LCSW discussed cycle of substance use and importance of healthy coping skills. LCSW commended pt on participating in medication management and outpatient services. Pt was encouraged to contact LCSW with any additional concerns or resource needs. Pt was appreciative for the call.   PLAN: 1. Follow up with behavioral health clinician on : Pt encouraged to schedule follow up appointment when needed 2. Behavioral recommendations: Continue implementing healthy coping skills and participating in [place] 3. Referral(s): [place] (In Clinic)  Rebekah [place], [place] 04/30/2019 8:18 AM

## 2019-04-27 ENCOUNTER — Other Ambulatory Visit (HOSPITAL_COMMUNITY): Payer: Self-pay | Admitting: Nurse Practitioner

## 2019-04-27 ENCOUNTER — Other Ambulatory Visit: Payer: Self-pay | Admitting: Nurse Practitioner

## 2019-04-27 DIAGNOSIS — R768 Other specified abnormal immunological findings in serum: Secondary | ICD-10-CM

## 2019-04-27 DIAGNOSIS — R748 Abnormal levels of other serum enzymes: Secondary | ICD-10-CM

## 2019-04-27 DIAGNOSIS — K7010 Alcoholic hepatitis without ascites: Secondary | ICD-10-CM

## 2019-05-07 ENCOUNTER — Other Ambulatory Visit: Payer: Self-pay | Admitting: Student

## 2019-05-07 ENCOUNTER — Other Ambulatory Visit: Payer: Self-pay | Admitting: Radiology

## 2019-05-08 ENCOUNTER — Encounter (HOSPITAL_COMMUNITY): Payer: Self-pay

## 2019-05-08 ENCOUNTER — Other Ambulatory Visit: Payer: Self-pay

## 2019-05-08 ENCOUNTER — Ambulatory Visit (HOSPITAL_COMMUNITY)
Admission: RE | Admit: 2019-05-08 | Discharge: 2019-05-08 | Disposition: A | Discharge status: Home / Self Care | Payer: Medicaid Other | Source: Ambulatory Visit | Attending: Nurse Practitioner | Admitting: Nurse Practitioner

## 2019-05-08 DIAGNOSIS — K7010 Alcoholic hepatitis without ascites: Secondary | ICD-10-CM

## 2019-05-08 DIAGNOSIS — R768 Other specified abnormal immunological findings in serum: Secondary | ICD-10-CM

## 2019-05-08 DIAGNOSIS — R748 Abnormal levels of other serum enzymes: Secondary | ICD-10-CM

## 2019-05-08 NOTE — Progress Notes (Signed)
Patient with history of ongoing ETOH abuse who presented to IR today for planned random liver biopsy to assess for ETOH hepatitis, elevated LFTs, positive ANA.   Patient presented to [facility] visibly intoxicated and slurring her words. She initially stated to RN that she had 2 shots prior to arrival, however during my discussion with her she does admit that she has been drinking wine all morning in addition to "some shots." We discussed that we are unable to proceed with a liver biopsy if she has any amount of ETOH in her system on the day of the procedure as it is very unsafe for her given the increased bleeding risk and use of sedating medications causing increased risk of respiratory distress. 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 She states that she has had withdrawal seizures before and as such cannot stop drinking for a procedure. When asked how she is able to go to work as a Marine scientist she does not respond, when asked directly if she drinks at work she does not respond. She asks if she may keep her urine sample - when asked why she would want to keep her urine sample she does not respond.   Discussed rescheduling biopsy for first procedure of the day so that patient is not tempted to drink prior to arrival, however she states that she will still drink beforehand because "I can't stop, I have a problem."   I spoke with Roosevelt Locks, NP today via phone regarding this patient - she states that she will schedule a virtual visit with Joann [name] to discuss liver biopsy and ongoing ETOH use. We will hold off on rescheduling liver biopsy at the moment until patient can be seen by NP and plan is in place for patient to present to procedure when she is sober.   I spoke with Joann [name] husband today who states he was the person who dropped her and he will be picking her up today. He is aware that she  cannot present for liver biopsy in the future if she has been drinking.   Patient to be discharged to home with husband.   Please call with questions or concerns.  Candiss Norse, PA-C Pager# [phone]

## 2019-05-08 NOTE — Progress Notes (Signed)
Larene Beach, PA with IR in to see pt. Will be rescheduled for another day per PA.

## 2019-05-08 NOTE — Progress Notes (Addendum)
Pt arrives to hospital reporting taking 2 shots of alcohol at 10:00 and has been drinking for most of the morning. Pt noted to have slurred speech with difficulty balancing/walking. Pt scheduled for a liver biopsy. Fanny Skates, PA for IR paged and informed. Instructed this nurse to wait until assessed before proceeding with getting patient ready for procedure.

## 2019-05-31 ENCOUNTER — Ambulatory Visit (HOSPITAL_COMMUNITY)
Admission: EM | Admit: 2019-05-31 | Discharge: 2019-05-31 | Disposition: A | Discharge status: Home / Self Care | Payer: Medicaid Other | Attending: Family Medicine | Admitting: Family Medicine

## 2019-05-31 ENCOUNTER — Encounter (HOSPITAL_COMMUNITY): Payer: Self-pay | Admitting: Family Medicine

## 2019-05-31 ENCOUNTER — Other Ambulatory Visit: Payer: Self-pay

## 2019-05-31 DIAGNOSIS — L210 Seborrhea capitis: Secondary | ICD-10-CM

## 2019-05-31 MED ORDER — HYDROCORTISONE 2.5 % EX LOTN: TOPICAL_LOTION | Freq: Two times a day (BID) | CUTANEOUS | 0 refills | Status: DC

## 2019-05-31 MED ORDER — KETOCONAZOLE 2 % EX SHAM: 1.0000 "application " | MEDICATED_SHAMPOO | CUTANEOUS | 0 refills | Status: DC

## 2019-05-31 NOTE — ED Triage Notes (Signed)
Pt states she has dry scalpe and her hair is coming out x 2 weeks.

## 2019-05-31 NOTE — ED Provider Notes (Signed)
[facility]    CSN: [id number] Arrival date & time: 05/31/19  0957      History   Chief Complaint Chief Complaint  Patient presents with  . dry scaple    HPI Joann [name] is a 38 y.o. female.   Initial visit for this 38 yo woman who presents with complaints of scalp problem.  One month of scaling and hair loss.  Patient now has 2 weeks of sobriety.     Past Medical History:  Diagnosis Date  . Alcohol abuse   . Anxiety   . Hypertension   . Liver disease     Patient Active Problem List   Diagnosis Date Noted  . Alcoholic hepatitis without ascites   . Hypokalemia due to inadequate potassium intake 04/02/2019  . Alcohol withdrawal ([place]) 10/24/2018  . Alcohol dependence with withdrawal, unspecified ([place]) 10/24/2018  . Elevated LFTs 10/24/2018  . Thrombocytopenia ([place]) 10/24/2018  . Alcohol-induced acute pancreatitis   . Acute blood loss anemia   . Alcoholic ketoacidosis 48/27/0786  . Alcohol abuse with alcohol-induced mood disorder ([place]) 09/14/2017  . Elevated liver enzymes 04/29/2017    Past Surgical History:  Procedure Laterality Date  . ECTOPIC PREGNANCY SURGERY      OB History   No obstetric history on file.      Home Medications    Prior to Admission medications   Medication Sig Start Date End Date Taking? Authorizing Provider  hydrocortisone 2.5 % lotion Apply topically 2 (two) times daily. 05/31/19   Robyn Haber, MD  ketoconazole (NIZORAL) 2 % shampoo Apply 1 application topically 2 (two) times a week. 06/01/19   Robyn Haber, MD  Melatonin 1 MG TABS Take 1 mg by mouth at bedtime as needed (sleep).    [provider]  Multiple Vitamin (MULTIVITAMIN WITH MINERALS) TABS tablet Take 1 tablet by mouth 2 (two) times a week.     [provider]    Family History Family History  Problem Relation Age of Onset  . Healthy Mother   . Alcohol abuse Mother        quit in her 101s  . Alcohol abuse Father   . Diabetes  Father   . Healthy Brother   . Hypertension Maternal Grandmother   . Hypertension Maternal Grandfather   . Healthy Brother   . Healthy Brother     Social History Social History   Tobacco Use  . Smoking status: Never Smoker  . Smokeless tobacco: Never Used  Substance Use Topics  . Alcohol use: Yes    Comment: "I just do it"  . Drug use: Never     Allergies   Patient has no known allergies.   Review of Systems Review of Systems  Skin: Positive for rash.  All other systems reviewed and are negative.    Physical Exam Triage Vital Signs ED Triage Vitals  Enc Vitals Group     BP      Pulse      Resp      Temp      Temp src      SpO2      Weight      Height      Head Circumference      Peak Flow      Pain Score      Pain Loc      Pain Edu?      Excl. in [place]?    No data found.  Updated Vital Signs BP  104/76 (BP Location: Right Arm)   Pulse 86   Temp 98.1 F (36.7 C) (Oral)   Resp 16   Wt 63.5 kg   LMP 03/30/2019   SpO2 100%   BMI 25.61 kg/m    Physical Exam Vitals signs and nursing note reviewed.  Constitutional:      Appearance: Normal appearance.  Neck:     Musculoskeletal: Normal range of motion and neck supple.  Musculoskeletal: Normal range of motion.  Skin:    General: Skin is warm and dry.     Comments: Scaly  Scalp with oily texture  Neurological:     General: No focal deficit present.     Mental Status: She is alert and oriented to person, place, and time.  Psychiatric:        Mood and Affect: Mood normal.      UC Treatments / Results  Labs (all labs ordered are listed, but only abnormal results are displayed) Labs Reviewed - No data to display  EKG   Radiology No results found.  Procedures Procedures (including critical care time)  Medications Ordered in UC Medications - No data to display  Initial Impression / Assessment and Plan / UC Course  I have reviewed the triage vital signs and the nursing notes.   Pertinent labs & imaging results that were available during my care of the patient were reviewed by me and considered in my medical decision making (see chart for details).    Final Clinical Impressions(s) / UC Diagnoses   Final diagnoses:  Seborrhea capitis in adult   Discharge Instructions   None    ED Prescriptions    Medication Sig Dispense Auth. Provider   ketoconazole (NIZORAL) 2 % shampoo Apply 1 application topically 2 (two) times a week. 120 mL Robyn Haber, MD   hydrocortisone 2.5 % lotion Apply topically 2 (two) times daily. 59 mL Robyn Haber, MD     I have reviewed the PDMP during this encounter.   Robyn Haber, MD 05/31/19 1029

## 2019-06-16 ENCOUNTER — Other Ambulatory Visit (HOSPITAL_COMMUNITY): Payer: Self-pay | Admitting: Nurse Practitioner

## 2019-06-16 DIAGNOSIS — K7010 Alcoholic hepatitis without ascites: Secondary | ICD-10-CM

## 2019-06-16 DIAGNOSIS — R748 Abnormal levels of other serum enzymes: Secondary | ICD-10-CM

## 2019-06-16 DIAGNOSIS — R768 Other specified abnormal immunological findings in serum: Secondary | ICD-10-CM

## 2019-07-03 ENCOUNTER — Other Ambulatory Visit: Payer: Self-pay | Admitting: Physician Assistant

## 2019-07-06 ENCOUNTER — Encounter (HOSPITAL_COMMUNITY): Payer: Self-pay

## 2019-07-06 ENCOUNTER — Other Ambulatory Visit: Payer: Self-pay

## 2019-07-06 ENCOUNTER — Ambulatory Visit (HOSPITAL_COMMUNITY)
Admission: RE | Admit: 2019-07-06 | Discharge: 2019-07-06 | Disposition: A | Discharge status: Home / Self Care | Payer: Medicaid Other | Source: Ambulatory Visit | Attending: Nurse Practitioner | Admitting: Nurse Practitioner

## 2019-07-06 DIAGNOSIS — I10 Essential (primary) hypertension: Secondary | ICD-10-CM | POA: Diagnosis not present

## 2019-07-06 DIAGNOSIS — K7010 Alcoholic hepatitis without ascites: Secondary | ICD-10-CM

## 2019-07-06 DIAGNOSIS — R760 Raised antibody titer: Secondary | ICD-10-CM | POA: Insufficient documentation

## 2019-07-06 DIAGNOSIS — Z833 Family history of diabetes mellitus: Secondary | ICD-10-CM | POA: Insufficient documentation

## 2019-07-06 DIAGNOSIS — K7581 Nonalcoholic steatohepatitis (NASH): Secondary | ICD-10-CM | POA: Diagnosis not present

## 2019-07-06 DIAGNOSIS — F1010 Alcohol abuse, uncomplicated: Secondary | ICD-10-CM | POA: Insufficient documentation

## 2019-07-06 DIAGNOSIS — Z79899 Other long term (current) drug therapy: Secondary | ICD-10-CM | POA: Diagnosis not present

## 2019-07-06 DIAGNOSIS — Z811 Family history of alcohol abuse and dependence: Secondary | ICD-10-CM | POA: Diagnosis not present

## 2019-07-06 DIAGNOSIS — R768 Other specified abnormal immunological findings in serum: Secondary | ICD-10-CM

## 2019-07-06 DIAGNOSIS — F419 Anxiety disorder, unspecified: Secondary | ICD-10-CM | POA: Insufficient documentation

## 2019-07-06 DIAGNOSIS — R748 Abnormal levels of other serum enzymes: Secondary | ICD-10-CM | POA: Insufficient documentation

## 2019-07-06 LAB — CBC
HCT: 30.8 % — ABNORMAL LOW (ref 36.0–46.0)
Hemoglobin: 10.2 g/dL — ABNORMAL LOW (ref 12.0–15.0)
MCH: 33.4 pg (ref 26.0–34.0)
MCHC: 33.1 g/dL (ref 30.0–36.0)
MCV: 101 fL — ABNORMAL HIGH (ref 80.0–100.0)
Platelets: 127 10*3/uL — ABNORMAL LOW (ref 150–400)
RBC: 3.05 MIL/uL — ABNORMAL LOW (ref 3.87–5.11)
RDW: 19.6 % — ABNORMAL HIGH (ref 11.5–15.5)
WBC: 4.2 10*3/uL (ref 4.0–10.5)
nRBC: 0 % (ref 0.0–0.2)

## 2019-07-06 LAB — PROTIME-INR
INR: 1.1 (ref 0.8–1.2)
Prothrombin Time: 14.1 seconds (ref 11.4–15.2)

## 2019-07-06 LAB — PREGNANCY, URINE: Preg Test, Ur: NEGATIVE

## 2019-07-06 MED ORDER — ONDANSETRON HCL 4 MG/2ML IJ SOLN
INTRAMUSCULAR | Status: AC
  Filled 2019-07-06: qty 2

## 2019-07-06 MED ORDER — ONDANSETRON HCL 4 MG/2ML IJ SOLN
4.0000 mg | Freq: Once | INTRAMUSCULAR | Status: AC
  Administered 2019-07-06: 4 mg via INTRAVENOUS
  Filled 2019-07-06: qty 2

## 2019-07-06 MED ORDER — FENTANYL CITRATE (PF) 100 MCG/2ML IJ SOLN
INTRAMUSCULAR | Status: AC
  Filled 2019-07-06: qty 2

## 2019-07-06 MED ORDER — HYDROCODONE-ACETAMINOPHEN 5-325 MG PO TABS
1.0000 | ORAL_TABLET | ORAL | Status: DC | PRN
  Administered 2019-07-06: 1 via ORAL
  Filled 2019-07-06: qty 1

## 2019-07-06 MED ORDER — MIDAZOLAM HCL 2 MG/2ML IJ SOLN
INTRAMUSCULAR | Status: AC | PRN
  Administered 2019-07-06: 1 mg via INTRAVENOUS
  Administered 2019-07-06: 0.5 mg via INTRAVENOUS

## 2019-07-06 MED ORDER — FENTANYL CITRATE (PF) 100 MCG/2ML IJ SOLN
INTRAMUSCULAR | Status: AC | PRN
  Administered 2019-07-06 (×2): 25 ug via INTRAVENOUS
  Administered 2019-07-06: 50 ug via INTRAVENOUS

## 2019-07-06 MED ORDER — SODIUM CHLORIDE 0.9 % IV SOLN: INTRAVENOUS | Status: DC

## 2019-07-06 MED ORDER — SODIUM CHLORIDE 0.9 % IV SOLN
INTRAVENOUS | Status: AC | PRN
  Administered 2019-07-06: 10 mL/h via INTRAVENOUS

## 2019-07-06 MED ORDER — LIDOCAINE HCL (PF) 1 % IJ SOLN
INTRAMUSCULAR | Status: AC
  Filled 2019-07-06: qty 30

## 2019-07-06 MED ORDER — MIDAZOLAM HCL 2 MG/2ML IJ SOLN
INTRAMUSCULAR | Status: AC
  Filled 2019-07-06: qty 2

## 2019-07-06 MED ORDER — GELATIN ABSORBABLE 12-7 MM EX MISC
CUTANEOUS | Status: AC
  Filled 2019-07-06: qty 1

## 2019-07-06 NOTE — Progress Notes (Signed)
C/o nausea Jannifer Franklin, PA informed new orders noted

## 2019-07-06 NOTE — H&P (Signed)
Chief Complaint: Patient was seen in consultation today for random liver biopsy at the request of Joann [name]  Referring Physician(s): Joann [name]  Supervising Physician: Joann [name]  Patient Status: [facility] - Out-pt  History of Present Illness: Joann [name] is a 38 y.o. female   Alcohol use Aware of elevated liver functions now over 6 mo Was referred to Joann Locks NP for evaluation and treatment  Request made for liver core biopsy  Past Medical History:  Diagnosis Date  . Alcohol abuse   . Anxiety   . Hypertension   . Liver disease     Past Surgical History:  Procedure Laterality Date  . ECTOPIC PREGNANCY SURGERY      Allergies: Patient has no known allergies.  Medications: Prior to Admission medications   Medication Sig Start Date End Date Taking? Authorizing Provider  folic acid (FOLVITE) 1 MG tablet Take 1 mg by mouth daily.   Yes [provider]  hydrocortisone 2.5 % lotion Apply topically 2 (two) times daily. 05/31/19  Yes Joann Haber, MD  hydrOXYzine (ATARAX/VISTARIL) 25 MG tablet Take 25 mg by mouth every 6 (six) hours as needed for anxiety.   Yes [provider]  ketoconazole (NIZORAL) 2 % shampoo Apply 1 application topically 2 (two) times a week. 06/01/19  Yes Joann Haber, MD  Multiple Vitamin (MULTIVITAMIN WITH MINERALS) TABS tablet Take 1 tablet by mouth 2 (two) times a week.    Yes [provider]  traZODone (DESYREL) 50 MG tablet Take 50 mg by mouth at bedtime.   Yes [provider]  venlafaxine (EFFEXOR) 75 MG tablet Take 75 mg by mouth daily.   Yes [provider]     Family History  Problem Relation Age of Onset  . Healthy Mother   . Alcohol abuse Mother        quit in her 18s  . Alcohol abuse Father   . Diabetes Father   . Healthy Brother   . Hypertension Maternal Grandmother   . Hypertension Maternal Grandfather   . Healthy Brother   . Healthy Brother     Social History    Socioeconomic History  . Marital status: Married    Spouse name: Not on file  . Number of children: Not on file  . Years of education: Not on file  . Highest education level: Not on file  Occupational History  . Occupation: nurse  Social Needs  . Financial resource strain: Not on file  . Food insecurity    Worry: Not on file    Inability: Not on file  . Transportation needs    Medical: Not on file    Non-medical: Not on file  Tobacco Use  . Smoking status: Never Smoker  . Smokeless tobacco: Never Used  Substance and Sexual Activity  . Alcohol use: Yes    Comment: "I just do it"  . Drug use: Never  . Sexual activity: Yes  Lifestyle  . Physical activity    Days per week: Not on file    Minutes per session: Not on file  . Stress: Not on file  Relationships  . Social Herbalist on phone: Not on file    Gets together: Not on file    Attends religious service: Not on file    Active member of club or organization: Not on file    Attends meetings of clubs or organizations: Not on file    Relationship status: Not on file  Other Topics  Concern  . Not on file  Social History Narrative  . Not on file    Review of Systems: A 12 point ROS discussed and pertinent positives are indicated in the HPI above.  All other systems are negative.  Review of Systems  Constitutional: Negative for activity change, fatigue and fever.  Respiratory: Negative for cough and shortness of breath.   Gastrointestinal: Negative for abdominal pain, blood in stool and nausea.  Neurological: Negative for weakness.  Psychiatric/Behavioral: Negative for behavioral problems and confusion.    Vital Signs: BP 106/75   Pulse 82   Temp 98.1 F (36.7 C)   Resp 16   Ht 5' 4"  (1.626 m)   Wt 140 lb (63.5 kg)   LMP 05/30/2019   SpO2 95%   BMI 24.03 kg/m   Physical Exam Vitals signs reviewed.  Constitutional:      Appearance: Normal appearance.  Cardiovascular:     Rate and Rhythm: Normal  rate and regular rhythm.     Heart sounds: Normal heart sounds.  Pulmonary:     Breath sounds: Normal breath sounds.  Abdominal:     Palpations: Abdomen is soft.     Tenderness: There is no abdominal tenderness.  Musculoskeletal: Normal range of motion.  Skin:    General: Skin is warm and dry.  Neurological:     Mental Status: She is alert and oriented to person, place, and time.  Psychiatric:        Mood and Affect: Mood normal.        Behavior: Behavior normal.        Thought Content: Thought content normal.        Judgment: Judgment normal.     Imaging: No results found.  Labs:  CBC: Recent Labs    10/26/18 0351 11/21/18 2131 04/02/19 0842 04/03/19 0442  WBC 3.3* 3.6* 3.8* 3.2*  HGB 9.2* 11.3* 10.2* 8.5*  HCT 27.0* 35.2* 29.6* 25.5*  PLT 54* 145* 86* 67*    COAGS: Recent Labs    10/26/18 1155 04/02/19 0917 07/06/19 0607  INR 1.12 1.2 1.1  APTT 29  --   --     BMP: Recent Labs    11/21/18 2131 12/19/18 1534 04/02/19 0842 04/03/19 0442  NA 141 143 133* 135  K 3.6 3.4* 2.9* 3.8  CL 113* 106 94* 103  CO2 21* 19* 20* 21*  GLUCOSE 105* 71 162* 81  BUN 6 8 7  5*  CALCIUM 8.5* 8.7* 8.5* 7.7*  CREATININE 0.58 0.45 0.45 0.66  GFRNONAA >60 >60 >60 >60  GFRAA >60 >60 >60 >60    LIVER FUNCTION TESTS: Recent Labs    11/21/18 2131 12/19/18 1534 04/02/19 0842 04/03/19 0442  BILITOT 0.8 0.8 7.0* 6.6*  AST 360* 165* 620* 418*  ALT 59* 43 85* 59*  ALKPHOS 242* 250* 297* 232*  PROT 7.4 7.6 7.8 6.1*  ALBUMIN 3.3* 3.8 3.6 2.8*    TUMOR MARKERS: No results for input(s): AFPTM, CEA, CA199, CHROMGRNA in the last 8760 hours.  Assessment and Plan:  Elevated liver functions Alcohol use Possible autoimmune hepatitis Scheduled for liver core biopsy Risks and benefits of liver core biopsy was discussed with the patient and/or patient's family including, but not limited to bleeding, infection, damage to adjacent structures or low yield requiring additional  tests.  All of the questions were answered and there is agreement to proceed.  Consent signed and in chart.   Thank you for this interesting consult.  I greatly enjoyed  meeting Joann [name] and look forward to participating in their care.  A copy of this report was sent to the requesting provider on this date.  Electronically Signed: Lavonia Drafts, PA-C 07/06/2019, 7:06 AM   I spent a total of  30 Minutes   in face to face in clinical consultation, greater than 50% of which was counseling/coordinating care for liver core biopsy

## 2019-07-06 NOTE — Discharge Instructions (Addendum)
Liver Biopsy, Care After These instructions give you information on caring for yourself after your procedure. Your doctor may also give you more specific instructions. Call your doctor if you have any problems or questions after your procedure. What can I expect after the procedure? After the procedure, it is common to have:  Pain and soreness where the biopsy was done.  Bruising around the area where the biopsy was done.  Sleepiness and be tired for a few days. Follow these instructions at home: Medicines  Take over-the-counter and prescription medicines only as told by your doctor.  If you were prescribed an antibiotic medicine, take it as told by your doctor. Do not stop taking the antibiotic even if you start to feel better.  Do not take medicines such as aspirin and ibuprofen. These medicines can thin your blood. Do not take these medicines unless your doctor tells you to take them.  If you are taking prescription pain medicine, take actions to prevent or treat constipation. Your doctor may recommend that you: ? Drink enough fluid to keep your pee (urine) clear or pale yellow. ? Take over-the-counter or prescription medicines. ? Eat foods that are high in fiber, such as fresh fruits and vegetables, whole grains, and beans. ? Limit foods that are high in fat and processed sugars, such as fried and sweet foods. Caring for your cut  Follow instructions from your doctor about how to take care of your cuts from surgery (incisions). Make sure you: ? Wash your hands with soap and water before you change your bandage (dressing). If you cannot use soap and water, use hand sanitizer. ? Change your bandage as told by your doctor. ? Leave stitches (sutures), skin glue, or skin tape (adhesive) strips in place. They may need to stay in place for 2 weeks or longer. If tape strips get loose and curl up, you may trim the loose edges. Do not remove tape strips completely unless your doctor says it is  okay.  Check your cuts every day for signs of infection. Check for: ? Redness, swelling, or more pain. ? Fluid or blood. ? Pus or a bad smell. ? Warmth.  Do not take baths, swim, or use a hot tub until your doctor says it is okay to do so. Activity   Rest at home for 1-2 days or as told by your doctor. ? Avoid sitting for a long time without moving. Get up to take short walks every 1-2 hours.  Return to your normal activities as told by your doctor. Ask what activities are safe for you.  Do not do these things in the first 24 hours: ? Drive. ? Use machinery. ? Take a bath or shower.  Do not lift more than 10 pounds (4.5 kg) or play contact sports for the first 2 weeks. General instructions   Do not drink alcohol in the first week after the procedure.  Have someone stay with you for at least 24 hours after the procedure.  Get your test results. Ask your doctor or the department that is doing the test: ? When will my results be ready? ? How will I get my results? ? What are my treatment options? ? What other tests do I need? ? What are my next steps?  Keep all follow-up visits as told by your doctor. This is important. Contact a doctor if:  A cut bleeds and leaves more than just a small spot of blood.  A cut is red, puffs up (  swells), or hurts more than before.  Fluid or something else comes from a cut.  A cut smells bad.  You have a fever or chills. Get help right away if:  You have swelling, bloating, or pain in your belly (abdomen).  You get dizzy or faint.  You have a rash.  You feel sick to your stomach (nauseous) or throw up (vomit).  You have trouble breathing, feel short of breath, or feel faint.  Your chest hurts.  You have problems talking or seeing.  You have trouble with your balance or moving your arms or legs. Summary  After the procedure, it is common to have pain, soreness, bruising, and tiredness.  Your doctor will tell you how to  take care of yourself at home. Change your bandage, take your medicines, and limit your activities as told by your doctor.  Call your doctor if you have symptoms of infection. Get help right away if your belly swells, your cut bleeds a lot, or you have trouble talking or breathing. This information is not intended to replace advice given to you by your health care provider. Make sure you discuss any questions you have with your health care provider. Document Released: 06/05/2008 Document Revised: 09/06/2017 Document Reviewed: 09/06/2017 Elsevier Patient Education  [place]. Moderate Conscious Sedation, Adult, Care After These instructions provide you with information about caring for yourself after your procedure. Your health care provider may also give you more specific instructions. Your treatment has been planned according to current medical practices, but problems sometimes occur. Call your health care provider if you have any problems or questions after your procedure. What can I expect after the procedure? After your procedure, it is common:  To feel sleepy for several hours.  To feel clumsy and have poor balance for several hours.  To have poor judgment for several hours.  To vomit if you eat too soon. Follow these instructions at home: For at least 24 hours after the procedure:   Do not: ? Participate in activities where you could fall or become injured. ? Drive. ? Use heavy machinery. ? Drink alcohol. ? Take sleeping pills or medicines that cause drowsiness. ? Make important decisions or sign legal documents. ? Take care of children on your own.  Rest. Eating and drinking  Follow the diet recommended by your health care provider.  If you vomit: ? Drink water, juice, or soup when you can drink without vomiting. ? Make sure you have little or no nausea before eating solid foods. General instructions  Have a responsible adult stay with you until you are awake  and alert.  Take over-the-counter and prescription medicines only as told by your health care provider.  If you smoke, do not smoke without supervision.  Keep all follow-up visits as told by your health care provider. This is important. Contact a health care provider if:  You keep feeling nauseous or you keep vomiting.  You feel light-headed.  You develop a rash.  You have a fever. Get help right away if:  You have trouble breathing. This information is not intended to replace advice given to you by your health care provider. Make sure you discuss any questions you have with your health care provider. Document Released: 06/17/2013 Document Revised: 08/09/2017 Document Reviewed: 12/17/2015 Elsevier Patient Education  2020 [employer].

## 2019-07-06 NOTE — Procedures (Signed)
  Procedure: [place] core liver biopsy   EBL:   minimal Complications:  none immediate  See full dictation in [facility].  Dillard Cannon MD Main # [phone] Pager  [phone]

## 2019-07-09 LAB — SURGICAL PATHOLOGY

## 2019-08-28 ENCOUNTER — Emergency Department (HOSPITAL_COMMUNITY)
Admission: EM | Admit: 2019-08-28 | Discharge: 2019-08-28 | Disposition: A | Discharge status: Home / Self Care | Payer: Medicaid Other | Attending: Emergency Medicine | Admitting: Emergency Medicine

## 2019-08-28 ENCOUNTER — Other Ambulatory Visit: Payer: Self-pay

## 2019-08-28 DIAGNOSIS — R040 Epistaxis: Secondary | ICD-10-CM | POA: Diagnosis not present

## 2019-08-28 DIAGNOSIS — Z5321 Procedure and treatment not carried out due to patient leaving prior to being seen by health care provider: Secondary | ICD-10-CM | POA: Diagnosis not present

## 2019-08-28 LAB — COMPREHENSIVE METABOLIC PANEL
ALT: 34 U/L (ref 0–44)
AST: 284 U/L — ABNORMAL HIGH (ref 15–41)
Albumin: 3.3 g/dL — ABNORMAL LOW (ref 3.5–5.0)
Alkaline Phosphatase: 294 U/L — ABNORMAL HIGH (ref 38–126)
Anion gap: 20 — ABNORMAL HIGH (ref 5–15)
BUN: <5 mg/dL — ABNORMAL LOW (ref 6–20)
CO2: 18 mmol/L — ABNORMAL LOW (ref 22–32)
Calcium: 8.7 mg/dL — ABNORMAL LOW (ref 8.9–10.3)
Chloride: 101 mmol/L (ref 98–111)
Creatinine, Ser: 0.55 mg/dL (ref 0.44–1.00)
GFR calc Af Amer: >60 mL/min (ref >60)
GFR calc non Af Amer: >60 mL/min (ref >60)
Glucose, Bld: 134 mg/dL — ABNORMAL HIGH (ref 70–99)
Potassium: 3.4 mmol/L — ABNORMAL LOW (ref 3.5–5.1)
Sodium: 139 mmol/L (ref 135–145)
Total Bilirubin: 5.2 mg/dL — ABNORMAL HIGH (ref 0.3–1.2)
Total Protein: 8.4 g/dL — ABNORMAL HIGH (ref 6.5–8.1)

## 2019-08-28 LAB — CBC
HCT: 29.9 % — ABNORMAL LOW (ref 36.0–46.0)
Hemoglobin: 9.9 g/dL — ABNORMAL LOW (ref 12.0–15.0)
MCH: 34.7 pg — ABNORMAL HIGH (ref 26.0–34.0)
MCHC: 33.1 g/dL (ref 30.0–36.0)
MCV: 104.9 fL — ABNORMAL HIGH (ref 80.0–100.0)
Platelets: 137 10*3/uL — ABNORMAL LOW (ref 150–400)
RBC: 2.85 MIL/uL — ABNORMAL LOW (ref 3.87–5.11)
RDW: 21 % — ABNORMAL HIGH (ref 11.5–15.5)
WBC: 6 10*3/uL (ref 4.0–10.5)
nRBC: 0 % (ref 0.0–0.2)

## 2019-08-28 LAB — ETHANOL: Alcohol, Ethyl (B): 347 mg/dL (ref <10)

## 2019-08-28 NOTE — ED Triage Notes (Addendum)
Pt c/o waking up with a nose bleed and coughing up blood. States has hx of liver disease, problems with drinking and needs detox.

## 2019-08-28 NOTE — ED Notes (Signed)
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 Pt then proceeded to go back and have a seat in ED lobby and make a phone call.

## 2019-08-28 NOTE — ED Notes (Signed)
After patient ended her phone call, she came up to sort desk and stated she was leaving. Pt ambulatory out of ED with nad.

## 2019-10-09 DIAGNOSIS — L409 Psoriasis, unspecified: Secondary | ICD-10-CM | POA: Insufficient documentation

## 2019-10-13 ENCOUNTER — Encounter: Payer: Self-pay | Admitting: Medical Oncology

## 2019-10-15 ENCOUNTER — Telehealth: Payer: Self-pay | Admitting: Obstetrics & Gynecology

## 2019-10-15 NOTE — Telephone Encounter (Signed)
patient schedule 11/03/19

## 2019-10-15 NOTE — Telephone Encounter (Signed)
[facility] referring for Well women exam. Unable to leave voicemail, voicemail box is full unable to leave message

## 2019-10-20 MED ORDER — PNV PRENATAL PLUS MULTIVITAMIN 27-1 MG PO TABS
1.00 | ORAL_TABLET | ORAL | Status: DC
Start: 2019-10-19 — End: 2019-10-20

## 2019-10-21 ENCOUNTER — Ambulatory Visit: Payer: Medicaid Other | Admitting: Gastroenterology

## 2019-11-02 ENCOUNTER — Encounter: Payer: Self-pay | Admitting: *Deleted

## 2019-11-02 ENCOUNTER — Ambulatory Visit: Payer: Medicaid Other | Admitting: Gastroenterology

## 2019-11-02 DIAGNOSIS — R945 Abnormal results of liver function studies: Secondary | ICD-10-CM

## 2019-11-02 NOTE — Progress Notes (Deleted)
PCP:  Nelwyn Salisbury, PA-C   No chief complaint on file.    HPI:      Ms. Joann [name] is a 39 y.o. No obstetric history on file. who LMP was No LMP recorded. (Menstrual status: Irregular Periods)., presents today for her NP annual examination.  Her menses are {norm/abn:715}, lasting {number:22536} days.  Dysmenorrhea {dysmen:716}. She {does:18564} have intermenstrual bleeding.  Sex activity: {sex active:315163}.  Last Pap: not recent Hx of STDs: {STD hx:14358}  Last mammogram: not recent There is no FH of breast cancer. There is no FH of ovarian cancer. The patient {does:18564} do self-breast exams.  Tobacco use: {tob:20664} Alcohol use: {Alcohol:11675} No drug use.  Exercise: {exercise:31265}  She {does:18564} get adequate calcium and Vitamin D in her diet.   Past Medical History:  Diagnosis Date  . Alcohol abuse   . Anxiety   . Hypertension   . Liver disease     Past Surgical History:  Procedure Laterality Date  . ECTOPIC PREGNANCY SURGERY      Family History  Problem Relation Age of Onset  . Healthy Mother   . Alcohol abuse Mother        quit in her 18s  . Alcohol abuse Father   . Diabetes Father   . Healthy Brother   . Hypertension Maternal Grandmother   . Hypertension Maternal Grandfather   . Healthy Brother   . Healthy Brother     Social History   Socioeconomic History  . Marital status: Married    Spouse name: Not on file  . Number of children: Not on file  . Years of education: Not on file  . Highest education level: Not on file  Occupational History  . Occupation: nurse  Tobacco Use  . Smoking status: Never Smoker  . Smokeless tobacco: Never Used  Substance and Sexual Activity  . Alcohol use: Yes    Comment: "I just do it"  . Drug use: Never  . Sexual activity: Yes  Other Topics Concern  . Not on file  Social History Narrative  . Not on file   Social Determinants of Health   Financial Resource Strain:   . Difficulty of  Paying Living Expenses: Not on file  Food Insecurity:   . Worried About Charity fundraiser in the Last Year: Not on file  . Ran Out of Food in the Last Year: Not on file  Transportation Needs:   . Lack of Transportation (Medical): Not on file  . Lack of Transportation (Non-Medical): Not on file  Physical Activity:   . Days of Exercise per Week: Not on file  . Minutes of Exercise per Session: Not on file  Stress:   . Feeling of Stress : Not on file  Social Connections:   . Frequency of Communication with Friends and Family: Not on file  . Frequency of Social Gatherings with Friends and Family: Not on file  . Attends Religious Services: Not on file  . Active Member of Clubs or Organizations: Not on file  . Attends Archivist Meetings: Not on file  . Marital Status: Not on file  Intimate Partner Violence:   . Fear of Current or Ex-Partner: Not on file  . Emotionally Abused: Not on file  . Physically Abused: Not on file  . Sexually Abused: Not on file     Current Outpatient Medications:  .  folic acid (FOLVITE) 1 MG tablet, Take 1 mg by mouth daily., Disp: , Rfl:  .  hydrocortisone 2.5 % lotion, Apply topically 2 (two) times daily., Disp: 59 mL, Rfl: 0 .  hydrOXYzine (ATARAX/VISTARIL) 25 MG tablet, Take 25 mg by mouth every 6 (six) hours as needed for anxiety., Disp: , Rfl:  .  ketoconazole (NIZORAL) 2 % shampoo, Apply 1 application topically 2 (two) times a week., Disp: 120 mL, Rfl: 0 .  Multiple Vitamin (MULTIVITAMIN WITH MINERALS) TABS tablet, Take 1 tablet by mouth 2 (two) times a week. , Disp: , Rfl:  .  traZODone (DESYREL) 50 MG tablet, Take 50 mg by mouth at bedtime., Disp: , Rfl:  .  venlafaxine (EFFEXOR) 75 MG tablet, Take 75 mg by mouth daily., Disp: , Rfl:      ROS:  Review of Systems BREAST: No symptoms   Objective: There were no vitals taken for this visit.   OBGyn Exam  Results: No results found for this or any previous visit (from the past 24  hour(s)).  Assessment/Plan: No diagnosis found.  No orders of the defined types were placed in this encounter.            GYN counsel {counseling:16159}     F/U  No follow-ups on file.  Darian Ace B. Jalen Oberry, PA-C 11/02/2019 2:31 PM

## 2019-11-03 ENCOUNTER — Ambulatory Visit: Payer: Self-pay | Admitting: Obstetrics and Gynecology

## 2019-11-05 ENCOUNTER — Ambulatory Visit: Payer: Medicaid Other | Admitting: Gastroenterology

## 2019-11-06 ENCOUNTER — Ambulatory Visit: Payer: Medicaid Other | Admitting: Gastroenterology

## 2019-11-23 NOTE — Progress Notes (Deleted)
PCP:  Nelwyn Salisbury, PA-C   No chief complaint on file.  ?GW  HPI:      Ms. Joann [name] is a 39 y.o. No obstetric history on file. who LMP was No LMP recorded. (Menstrual status: Irregular Periods)., presents today for her NP annual examination.  Her menses are {norm/abn:715}, lasting {number:22536} days.  Dysmenorrhea {dysmen:716}. She {does:18564} have intermenstrual bleeding.  Sex activity: {sex active:315163}.  Last Pap: {BEEF:007121975}  Results were: {norm/abn:16707::"no abnormalities"} /neg HPV DNA *** Hx of STDs: {STD hx:14358}  Last mammogram: {date:304500300}  Results were: {norm/abn:13465} There is no FH of breast cancer. There is no FH of ovarian cancer. The patient {does:18564} do self-breast exams.  Tobacco use: {tob:20664} Alcohol use: {Alcohol:11675} No drug use.  Exercise: {exercise:31265}  She {does:18564} get adequate calcium and Vitamin D in her diet.   Past Medical History:  Diagnosis Date  . Alcohol abuse   . Anxiety   . Hypertension   . Liver disease     Past Surgical History:  Procedure Laterality Date  . ECTOPIC PREGNANCY SURGERY      Family History  Problem Relation Age of Onset  . Healthy Mother   . Alcohol abuse Mother        quit in her 9s  . Alcohol abuse Father   . Diabetes Father   . Healthy Brother   . Hypertension Maternal Grandmother   . Hypertension Maternal Grandfather   . Healthy Brother   . Healthy Brother     Social History   Socioeconomic History  . Marital status: Married    Spouse name: Not on file  . Number of children: Not on file  . Years of education: Not on file  . Highest education level: Not on file  Occupational History  . Occupation: nurse  Tobacco Use  . Smoking status: Never Smoker  . Smokeless tobacco: Never Used  Substance and Sexual Activity  . Alcohol use: Yes    Comment: "I just do it"  . Drug use: Never  . Sexual activity: Yes  Other Topics Concern  . Not on file  Social History  Narrative  . Not on file   Social Determinants of Health   Financial Resource Strain:   . Difficulty of Paying Living Expenses:   Food Insecurity:   . Worried About Charity fundraiser in the Last Year:   . Arboriculturist in the Last Year:   Transportation Needs:   . Film/video editor (Medical):   Marland Kitchen Lack of Transportation (Non-Medical):   Physical Activity:   . Days of Exercise per Week:   . Minutes of Exercise per Session:   Stress:   . Feeling of Stress :   Social Connections:   . Frequency of Communication with Friends and Family:   . Frequency of Social Gatherings with Friends and Family:   . Attends Religious Services:   . Active Member of Clubs or Organizations:   . Attends Archivist Meetings:   Marland Kitchen Marital Status:   Intimate Partner Violence:   . Fear of Current or Ex-Partner:   . Emotionally Abused:   Marland Kitchen Physically Abused:   . Sexually Abused:      Current Outpatient Medications:  .  folic acid (FOLVITE) 1 MG tablet, Take 1 mg by mouth daily., Disp: , Rfl:  .  hydrocortisone 2.5 % lotion, Apply topically 2 (two) times daily., Disp: 59 mL, Rfl: 0 .  hydrOXYzine (ATARAX/VISTARIL) 25 MG tablet, Take  25 mg by mouth every 6 (six) hours as needed for anxiety., Disp: , Rfl:  .  ketoconazole (NIZORAL) 2 % shampoo, Apply 1 application topically 2 (two) times a week., Disp: 120 mL, Rfl: 0 .  Multiple Vitamin (MULTIVITAMIN WITH MINERALS) TABS tablet, Take 1 tablet by mouth 2 (two) times a week. , Disp: , Rfl:  .  traZODone (DESYREL) 50 MG tablet, Take 50 mg by mouth at bedtime., Disp: , Rfl:  .  venlafaxine (EFFEXOR) 75 MG tablet, Take 75 mg by mouth daily., Disp: , Rfl:      ROS:  Review of Systems BREAST: No symptoms   Objective: There were no vitals taken for this visit.   OBGyn Exam  Results: No results found for this or any previous visit (from the past 24 hour(s)).  Assessment/Plan: No diagnosis found.  No orders of the defined types  were placed in this encounter.            GYN counsel {counseling:16159}     F/U  No follow-ups on file.  Jsiah Menta B. Justice Milliron, PA-C 11/23/2019 7:45 PM

## 2019-11-24 ENCOUNTER — Telehealth: Payer: Self-pay | Admitting: Obstetrics and Gynecology

## 2019-11-24 ENCOUNTER — Ambulatory Visit: Payer: Self-pay | Admitting: Obstetrics and Gynecology

## 2019-11-24 NOTE — Telephone Encounter (Signed)
[facility] referring for Well women exam. Patient No showed schedule appointment for 11/24/19. Called and left voicemail for patient to call back to be schedule

## 2019-12-09 ENCOUNTER — Ambulatory Visit: Payer: Medicaid Other | Admitting: Gastroenterology

## 2019-12-23 ENCOUNTER — Other Ambulatory Visit: Payer: Self-pay

## 2019-12-23 ENCOUNTER — Emergency Department (HOSPITAL_COMMUNITY): Payer: Medicaid Other

## 2019-12-23 ENCOUNTER — Inpatient Hospital Stay (HOSPITAL_COMMUNITY)
Admission: EM | Admit: 2019-12-23 | Discharge: 2020-01-08 | DRG: 432 | Disposition: A | Discharge status: Rehabilitation Facility | Payer: Medicaid Other | Attending: Internal Medicine | Admitting: Internal Medicine

## 2019-12-23 ENCOUNTER — Encounter (HOSPITAL_COMMUNITY): Payer: Self-pay

## 2019-12-23 DIAGNOSIS — D731 Hypersplenism: Secondary | ICD-10-CM | POA: Diagnosis present

## 2019-12-23 DIAGNOSIS — A419 Sepsis, unspecified organism: Secondary | ICD-10-CM | POA: Diagnosis not present

## 2019-12-23 DIAGNOSIS — N920 Excessive and frequent menstruation with regular cycle: Secondary | ICD-10-CM | POA: Diagnosis present

## 2019-12-23 DIAGNOSIS — K7010 Alcoholic hepatitis without ascites: Secondary | ICD-10-CM | POA: Diagnosis present

## 2019-12-23 DIAGNOSIS — Z79899 Other long term (current) drug therapy: Secondary | ICD-10-CM

## 2019-12-23 DIAGNOSIS — Z811 Family history of alcohol abuse and dependence: Secondary | ICD-10-CM

## 2019-12-23 DIAGNOSIS — R17 Unspecified jaundice: Secondary | ICD-10-CM

## 2019-12-23 DIAGNOSIS — K920 Hematemesis: Secondary | ICD-10-CM | POA: Diagnosis present

## 2019-12-23 DIAGNOSIS — J690 Pneumonitis due to inhalation of food and vomit: Secondary | ICD-10-CM | POA: Diagnosis not present

## 2019-12-23 DIAGNOSIS — G479 Sleep disorder, unspecified: Secondary | ICD-10-CM

## 2019-12-23 DIAGNOSIS — K766 Portal hypertension: Secondary | ICD-10-CM | POA: Diagnosis present

## 2019-12-23 DIAGNOSIS — E46 Unspecified protein-calorie malnutrition: Secondary | ICD-10-CM | POA: Diagnosis present

## 2019-12-23 DIAGNOSIS — F10239 Alcohol dependence with withdrawal, unspecified: Secondary | ICD-10-CM | POA: Diagnosis present

## 2019-12-23 DIAGNOSIS — E8809 Other disorders of plasma-protein metabolism, not elsewhere classified: Secondary | ICD-10-CM

## 2019-12-23 DIAGNOSIS — I8510 Secondary esophageal varices without bleeding: Secondary | ICD-10-CM | POA: Diagnosis present

## 2019-12-23 DIAGNOSIS — K7682 Hepatic encephalopathy: Secondary | ICD-10-CM | POA: Diagnosis present

## 2019-12-23 DIAGNOSIS — L8990 Pressure ulcer of unspecified site, unspecified stage: Secondary | ICD-10-CM | POA: Insufficient documentation

## 2019-12-23 DIAGNOSIS — E876 Hypokalemia: Secondary | ICD-10-CM | POA: Diagnosis present

## 2019-12-23 DIAGNOSIS — G259 Extrapyramidal and movement disorder, unspecified: Secondary | ICD-10-CM | POA: Diagnosis not present

## 2019-12-23 DIAGNOSIS — Z7189 Other specified counseling: Secondary | ICD-10-CM

## 2019-12-23 DIAGNOSIS — J9600 Acute respiratory failure, unspecified whether with hypoxia or hypercapnia: Secondary | ICD-10-CM

## 2019-12-23 DIAGNOSIS — Z781 Physical restraint status: Secondary | ICD-10-CM

## 2019-12-23 DIAGNOSIS — D684 Acquired coagulation factor deficiency: Secondary | ICD-10-CM | POA: Diagnosis present

## 2019-12-23 DIAGNOSIS — E870 Hyperosmolality and hypernatremia: Secondary | ICD-10-CM | POA: Diagnosis not present

## 2019-12-23 DIAGNOSIS — R188 Other ascites: Secondary | ICD-10-CM

## 2019-12-23 DIAGNOSIS — K7011 Alcoholic hepatitis with ascites: Secondary | ICD-10-CM

## 2019-12-23 DIAGNOSIS — Z20822 Contact with and (suspected) exposure to covid-19: Secondary | ICD-10-CM | POA: Diagnosis present

## 2019-12-23 DIAGNOSIS — Z9049 Acquired absence of other specified parts of digestive tract: Secondary | ICD-10-CM

## 2019-12-23 DIAGNOSIS — D6959 Other secondary thrombocytopenia: Secondary | ICD-10-CM | POA: Diagnosis present

## 2019-12-23 DIAGNOSIS — J9601 Acute respiratory failure with hypoxia: Secondary | ICD-10-CM | POA: Diagnosis not present

## 2019-12-23 DIAGNOSIS — R6521 Severe sepsis with septic shock: Secondary | ICD-10-CM | POA: Diagnosis not present

## 2019-12-23 DIAGNOSIS — Z01818 Encounter for other preprocedural examination: Secondary | ICD-10-CM

## 2019-12-23 DIAGNOSIS — D509 Iron deficiency anemia, unspecified: Secondary | ICD-10-CM | POA: Diagnosis present

## 2019-12-23 DIAGNOSIS — T380X5A Adverse effect of glucocorticoids and synthetic analogues, initial encounter: Secondary | ICD-10-CM | POA: Diagnosis not present

## 2019-12-23 DIAGNOSIS — R4182 Altered mental status, unspecified: Secondary | ICD-10-CM

## 2019-12-23 DIAGNOSIS — I951 Orthostatic hypotension: Secondary | ICD-10-CM

## 2019-12-23 DIAGNOSIS — K7290 Hepatic failure, unspecified without coma: Principal | ICD-10-CM

## 2019-12-23 DIAGNOSIS — D696 Thrombocytopenia, unspecified: Secondary | ICD-10-CM | POA: Diagnosis present

## 2019-12-23 DIAGNOSIS — K7031 Alcoholic cirrhosis of liver with ascites: Secondary | ICD-10-CM | POA: Diagnosis present

## 2019-12-23 DIAGNOSIS — Z515 Encounter for palliative care: Secondary | ICD-10-CM

## 2019-12-23 DIAGNOSIS — R040 Epistaxis: Secondary | ICD-10-CM | POA: Diagnosis present

## 2019-12-23 DIAGNOSIS — K7040 Alcoholic hepatic failure without coma: Principal | ICD-10-CM | POA: Diagnosis present

## 2019-12-23 DIAGNOSIS — D539 Nutritional anemia, unspecified: Secondary | ICD-10-CM

## 2019-12-23 DIAGNOSIS — Z6824 Body mass index (BMI) 24.0-24.9, adult: Secondary | ICD-10-CM

## 2019-12-23 DIAGNOSIS — N179 Acute kidney failure, unspecified: Secondary | ICD-10-CM | POA: Diagnosis present

## 2019-12-23 DIAGNOSIS — D72829 Elevated white blood cell count, unspecified: Secondary | ICD-10-CM

## 2019-12-23 DIAGNOSIS — R4020 Unspecified coma: Secondary | ICD-10-CM | POA: Diagnosis not present

## 2019-12-23 DIAGNOSIS — I10 Essential (primary) hypertension: Secondary | ICD-10-CM | POA: Diagnosis present

## 2019-12-23 LAB — I-STAT BETA HCG BLOOD, ED (MC, WL, AP ONLY): I-stat hCG, quantitative: <5 m[IU]/mL (ref <5)

## 2019-12-23 LAB — CBC
HCT: 26.4 % — ABNORMAL LOW (ref 36.0–46.0)
Hemoglobin: 8.2 g/dL — ABNORMAL LOW (ref 12.0–15.0)
MCH: 36.8 pg — ABNORMAL HIGH (ref 26.0–34.0)
MCHC: 31.1 g/dL (ref 30.0–36.0)
MCV: 118.4 fL — ABNORMAL HIGH (ref 80.0–100.0)
Platelets: 120 10*3/uL — ABNORMAL LOW (ref 150–400)
RBC: 2.23 MIL/uL — ABNORMAL LOW (ref 3.87–5.11)
RDW: 21.4 % — ABNORMAL HIGH (ref 11.5–15.5)
WBC: 17.6 10*3/uL — ABNORMAL HIGH (ref 4.0–10.5)
nRBC: 0.3 % — ABNORMAL HIGH (ref 0.0–0.2)

## 2019-12-23 LAB — COMPREHENSIVE METABOLIC PANEL
ALT: 26 U/L (ref 0–44)
AST: 143 U/L — ABNORMAL HIGH (ref 15–41)
Albumin: 1.5 g/dL — ABNORMAL LOW (ref 3.5–5.0)
Alkaline Phosphatase: 197 U/L — ABNORMAL HIGH (ref 38–126)
Anion gap: 17 — ABNORMAL HIGH (ref 5–15)
BUN: <5 mg/dL — ABNORMAL LOW (ref 6–20)
CO2: 22 mmol/L (ref 22–32)
Calcium: 8.1 mg/dL — ABNORMAL LOW (ref 8.9–10.3)
Chloride: 89 mmol/L — ABNORMAL LOW (ref 98–111)
Creatinine, Ser: 1.07 mg/dL — ABNORMAL HIGH (ref 0.44–1.00)
GFR calc Af Amer: >60 mL/min (ref >60)
GFR calc non Af Amer: >60 mL/min (ref >60)
Glucose, Bld: 107 mg/dL — ABNORMAL HIGH (ref 70–99)
Potassium: 2.3 mmol/L — CL (ref 3.5–5.1)
Sodium: 128 mmol/L — ABNORMAL LOW (ref 135–145)
Total Bilirubin: 26.6 mg/dL (ref 0.3–1.2)
Total Protein: 8.7 g/dL — ABNORMAL HIGH (ref 6.5–8.1)

## 2019-12-23 LAB — ACETAMINOPHEN LEVEL: Acetaminophen (Tylenol), Serum: <10 ug/mL — ABNORMAL LOW (ref 10–30)

## 2019-12-23 LAB — SALICYLATE LEVEL: Salicylate Lvl: 12.5 mg/dL (ref 7.0–30.0)

## 2019-12-23 LAB — ETHANOL: Alcohol, Ethyl (B): 13 mg/dL — ABNORMAL HIGH (ref <10)

## 2019-12-23 NOTE — ED Triage Notes (Addendum)
Per husband pt is a binge drinker and has not had a drink in a week, since then pt has not been eating and drinking and has lost a lot a wegiht, pt is refusing to talk, pt repeats, "Im ok" pt hold emesis bag to face as if she is nauseated. Denies SI, hesitates when questioned about HI thoughts but denies.

## 2019-12-24 ENCOUNTER — Emergency Department (HOSPITAL_COMMUNITY): Payer: Medicaid Other

## 2019-12-24 DIAGNOSIS — Z7189 Other specified counseling: Secondary | ICD-10-CM | POA: Diagnosis not present

## 2019-12-24 DIAGNOSIS — E870 Hyperosmolality and hypernatremia: Secondary | ICD-10-CM | POA: Diagnosis not present

## 2019-12-24 DIAGNOSIS — F10239 Alcohol dependence with withdrawal, unspecified: Secondary | ICD-10-CM

## 2019-12-24 DIAGNOSIS — R4182 Altered mental status, unspecified: Secondary | ICD-10-CM | POA: Diagnosis not present

## 2019-12-24 DIAGNOSIS — R739 Hyperglycemia, unspecified: Secondary | ICD-10-CM | POA: Diagnosis not present

## 2019-12-24 DIAGNOSIS — E8809 Other disorders of plasma-protein metabolism, not elsewhere classified: Secondary | ICD-10-CM | POA: Diagnosis not present

## 2019-12-24 DIAGNOSIS — D696 Thrombocytopenia, unspecified: Secondary | ICD-10-CM

## 2019-12-24 DIAGNOSIS — D684 Acquired coagulation factor deficiency: Secondary | ICD-10-CM | POA: Diagnosis present

## 2019-12-24 DIAGNOSIS — E876 Hypokalemia: Secondary | ICD-10-CM | POA: Diagnosis not present

## 2019-12-24 DIAGNOSIS — I10 Essential (primary) hypertension: Secondary | ICD-10-CM | POA: Diagnosis present

## 2019-12-24 DIAGNOSIS — I951 Orthostatic hypotension: Secondary | ICD-10-CM | POA: Diagnosis not present

## 2019-12-24 DIAGNOSIS — R040 Epistaxis: Secondary | ICD-10-CM | POA: Diagnosis present

## 2019-12-24 DIAGNOSIS — N179 Acute kidney failure, unspecified: Secondary | ICD-10-CM | POA: Diagnosis present

## 2019-12-24 DIAGNOSIS — R042 Hemoptysis: Secondary | ICD-10-CM

## 2019-12-24 DIAGNOSIS — K7682 Hepatic encephalopathy: Secondary | ICD-10-CM

## 2019-12-24 DIAGNOSIS — Z79899 Other long term (current) drug therapy: Secondary | ICD-10-CM | POA: Diagnosis not present

## 2019-12-24 DIAGNOSIS — K7011 Alcoholic hepatitis with ascites: Secondary | ICD-10-CM | POA: Diagnosis present

## 2019-12-24 DIAGNOSIS — D649 Anemia, unspecified: Secondary | ICD-10-CM | POA: Diagnosis not present

## 2019-12-24 DIAGNOSIS — R6521 Severe sepsis with septic shock: Secondary | ICD-10-CM | POA: Diagnosis not present

## 2019-12-24 DIAGNOSIS — R7401 Elevation of levels of liver transaminase levels: Secondary | ICD-10-CM | POA: Diagnosis not present

## 2019-12-24 DIAGNOSIS — J9600 Acute respiratory failure, unspecified whether with hypoxia or hypercapnia: Secondary | ICD-10-CM | POA: Diagnosis not present

## 2019-12-24 DIAGNOSIS — K7290 Hepatic failure, unspecified without coma: Secondary | ICD-10-CM

## 2019-12-24 DIAGNOSIS — G259 Extrapyramidal and movement disorder, unspecified: Secondary | ICD-10-CM | POA: Diagnosis not present

## 2019-12-24 DIAGNOSIS — R410 Disorientation, unspecified: Secondary | ICD-10-CM | POA: Diagnosis not present

## 2019-12-24 DIAGNOSIS — D539 Nutritional anemia, unspecified: Secondary | ICD-10-CM | POA: Diagnosis present

## 2019-12-24 DIAGNOSIS — K7040 Alcoholic hepatic failure without coma: Secondary | ICD-10-CM | POA: Diagnosis present

## 2019-12-24 DIAGNOSIS — K7010 Alcoholic hepatitis without ascites: Secondary | ICD-10-CM | POA: Diagnosis not present

## 2019-12-24 DIAGNOSIS — K766 Portal hypertension: Secondary | ICD-10-CM | POA: Diagnosis present

## 2019-12-24 DIAGNOSIS — A419 Sepsis, unspecified organism: Secondary | ICD-10-CM | POA: Diagnosis not present

## 2019-12-24 DIAGNOSIS — D6959 Other secondary thrombocytopenia: Secondary | ICD-10-CM | POA: Diagnosis present

## 2019-12-24 DIAGNOSIS — E512 Wernicke's encephalopathy: Secondary | ICD-10-CM | POA: Diagnosis not present

## 2019-12-24 DIAGNOSIS — Z20822 Contact with and (suspected) exposure to covid-19: Secondary | ICD-10-CM | POA: Diagnosis present

## 2019-12-24 DIAGNOSIS — R4020 Unspecified coma: Secondary | ICD-10-CM | POA: Diagnosis not present

## 2019-12-24 DIAGNOSIS — J9601 Acute respiratory failure with hypoxia: Secondary | ICD-10-CM | POA: Diagnosis not present

## 2019-12-24 DIAGNOSIS — E46 Unspecified protein-calorie malnutrition: Secondary | ICD-10-CM | POA: Diagnosis present

## 2019-12-24 DIAGNOSIS — D72828 Other elevated white blood cell count: Secondary | ICD-10-CM | POA: Diagnosis not present

## 2019-12-24 DIAGNOSIS — J690 Pneumonitis due to inhalation of food and vomit: Secondary | ICD-10-CM | POA: Diagnosis not present

## 2019-12-24 DIAGNOSIS — T380X5A Adverse effect of glucocorticoids and synthetic analogues, initial encounter: Secondary | ICD-10-CM | POA: Diagnosis not present

## 2019-12-24 DIAGNOSIS — I8510 Secondary esophageal varices without bleeding: Secondary | ICD-10-CM | POA: Diagnosis present

## 2019-12-24 DIAGNOSIS — K920 Hematemesis: Secondary | ICD-10-CM | POA: Diagnosis present

## 2019-12-24 DIAGNOSIS — Z01818 Encounter for other preprocedural examination: Secondary | ICD-10-CM | POA: Diagnosis not present

## 2019-12-24 DIAGNOSIS — Z811 Family history of alcohol abuse and dependence: Secondary | ICD-10-CM | POA: Diagnosis not present

## 2019-12-24 DIAGNOSIS — R17 Unspecified jaundice: Secondary | ICD-10-CM | POA: Diagnosis not present

## 2019-12-24 DIAGNOSIS — Z515 Encounter for palliative care: Secondary | ICD-10-CM | POA: Diagnosis not present

## 2019-12-24 DIAGNOSIS — D72823 Leukemoid reaction: Secondary | ICD-10-CM | POA: Diagnosis not present

## 2019-12-24 DIAGNOSIS — F10231 Alcohol dependence with withdrawal delirium: Secondary | ICD-10-CM | POA: Diagnosis not present

## 2019-12-24 DIAGNOSIS — F329 Major depressive disorder, single episode, unspecified: Secondary | ICD-10-CM | POA: Diagnosis not present

## 2019-12-24 HISTORY — DX: Hepatic encephalopathy: K76.82

## 2019-12-24 LAB — PHOSPHORUS
Phosphorus: 3.7 mg/dL (ref 2.5–4.6)
Phosphorus: 3.3 mg/dL (ref 2.5–4.6)

## 2019-12-24 LAB — CBC
HCT: 24.3 % — ABNORMAL LOW (ref 36.0–46.0)
Hemoglobin: 7.7 g/dL — ABNORMAL LOW (ref 12.0–15.0)
MCH: 36.8 pg — ABNORMAL HIGH (ref 26.0–34.0)
MCHC: 31.7 g/dL (ref 30.0–36.0)
MCV: 116.3 fL — ABNORMAL HIGH (ref 80.0–100.0)
Platelets: 100 10*3/uL — ABNORMAL LOW (ref 150–400)
RBC: 2.09 MIL/uL — ABNORMAL LOW (ref 3.87–5.11)
RDW: 21.2 % — ABNORMAL HIGH (ref 11.5–15.5)
WBC: 17.4 10*3/uL — ABNORMAL HIGH (ref 4.0–10.5)
nRBC: 0.2 % (ref 0.0–0.2)

## 2019-12-24 LAB — COMPREHENSIVE METABOLIC PANEL
ALT: 33 U/L (ref 0–44)
AST: 166 U/L — ABNORMAL HIGH (ref 15–41)
Albumin: 1.5 g/dL — ABNORMAL LOW (ref 3.5–5.0)
Alkaline Phosphatase: 183 U/L — ABNORMAL HIGH (ref 38–126)
Anion gap: 16 — ABNORMAL HIGH (ref 5–15)
BUN: <5 mg/dL — ABNORMAL LOW (ref 6–20)
CO2: 23 mmol/L (ref 22–32)
Calcium: 8.3 mg/dL — ABNORMAL LOW (ref 8.9–10.3)
Chloride: 96 mmol/L — ABNORMAL LOW (ref 98–111)
Creatinine, Ser: 0.84 mg/dL (ref 0.44–1.00)
GFR calc Af Amer: >60 mL/min (ref >60)
GFR calc non Af Amer: >60 mL/min (ref >60)
Glucose, Bld: 105 mg/dL — ABNORMAL HIGH (ref 70–99)
Potassium: 2.9 mmol/L — ABNORMAL LOW (ref 3.5–5.1)
Sodium: 135 mmol/L (ref 135–145)
Total Bilirubin: 24.3 mg/dL (ref 0.3–1.2)
Total Protein: 8.2 g/dL — ABNORMAL HIGH (ref 6.5–8.1)

## 2019-12-24 LAB — BASIC METABOLIC PANEL
Anion gap: 24 — ABNORMAL HIGH (ref 5–15)
BUN: <5 mg/dL — ABNORMAL LOW (ref 6–20)
CO2: 17 mmol/L — ABNORMAL LOW (ref 22–32)
Calcium: 8.5 mg/dL — ABNORMAL LOW (ref 8.9–10.3)
Chloride: 89 mmol/L — ABNORMAL LOW (ref 98–111)
Creatinine, Ser: 1.1 mg/dL — ABNORMAL HIGH (ref 0.44–1.00)
GFR calc Af Amer: >60 mL/min (ref >60)
GFR calc non Af Amer: >60 mL/min (ref >60)
Glucose, Bld: 126 mg/dL — ABNORMAL HIGH (ref 70–99)
Potassium: 2.3 mmol/L — CL (ref 3.5–5.1)
Sodium: 130 mmol/L — ABNORMAL LOW (ref 135–145)

## 2019-12-24 LAB — RESPIRATORY PANEL BY RT PCR (FLU A&B, COVID)
Influenza A by PCR: NEGATIVE
Influenza B by PCR: NEGATIVE
SARS Coronavirus 2 by RT PCR: NEGATIVE

## 2019-12-24 LAB — SARS CORONAVIRUS 2 BY RT PCR (DIASORIN): SARS Coronavirus 2: NEGATIVE

## 2019-12-24 LAB — HEPATIC FUNCTION PANEL
ALT: 32 U/L (ref 0–44)
AST: 164 U/L — ABNORMAL HIGH (ref 15–41)
Albumin: 1.6 g/dL — ABNORMAL LOW (ref 3.5–5.0)
Alkaline Phosphatase: 207 U/L — ABNORMAL HIGH (ref 38–126)
Bilirubin, Direct: 15.4 mg/dL — ABNORMAL HIGH (ref 0.0–0.2)
Indirect Bilirubin: 13.5 mg/dL — ABNORMAL HIGH (ref 0.3–0.9)
Total Bilirubin: 28.9 mg/dL (ref 0.3–1.2)
Total Protein: 9.7 g/dL — ABNORMAL HIGH (ref 6.5–8.1)

## 2019-12-24 LAB — APTT: aPTT: 48 seconds — ABNORMAL HIGH (ref 24–36)

## 2019-12-24 LAB — LIPASE, BLOOD: Lipase: 18 U/L (ref 11–51)

## 2019-12-24 LAB — PROTIME-INR
INR: 2.4 — ABNORMAL HIGH (ref 0.8–1.2)
INR: 2.6 — ABNORMAL HIGH (ref 0.8–1.2)
Prothrombin Time: 25.7 seconds — ABNORMAL HIGH (ref 11.4–15.2)
Prothrombin Time: 27.9 seconds — ABNORMAL HIGH (ref 11.4–15.2)

## 2019-12-24 LAB — HIV ANTIBODY (ROUTINE TESTING W REFLEX): HIV Screen 4th Generation wRfx: NONREACTIVE

## 2019-12-24 LAB — RAPID URINE DRUG SCREEN, HOSP PERFORMED
Amphetamines: NOT DETECTED
Barbiturates: NOT DETECTED
Benzodiazepines: NOT DETECTED
Cocaine: NOT DETECTED
Opiates: NOT DETECTED
Tetrahydrocannabinol: NOT DETECTED

## 2019-12-24 LAB — POC OCCULT BLOOD, ED: Fecal Occult Bld: POSITIVE — AB

## 2019-12-24 LAB — ABO/RH: ABO/RH(D): O POS

## 2019-12-24 LAB — AMMONIA: Ammonia: 137 umol/L — ABNORMAL HIGH (ref 9–35)

## 2019-12-24 LAB — MAGNESIUM: Magnesium: 1.8 mg/dL (ref 1.7–2.4)

## 2019-12-24 MED ORDER — LORAZEPAM 2 MG/ML IJ SOLN
1.0000 mg | INTRAMUSCULAR | Status: AC | PRN
  Administered 2019-12-24: 2 mg via INTRAVENOUS
  Filled 2019-12-24 (×2): qty 1

## 2019-12-24 MED ORDER — LACTULOSE 10 GM/15ML PO SOLN
30.0000 g | Freq: Four times a day (QID) | ORAL | Status: DC
  Administered 2019-12-24 (×2): 30 g via ORAL
  Filled 2019-12-24 (×3): qty 45

## 2019-12-24 MED ORDER — ADULT MULTIVITAMIN W/MINERALS CH
1.0000 | ORAL_TABLET | Freq: Every day | ORAL | Status: DC
  Filled 2019-12-24: qty 1

## 2019-12-24 MED ORDER — SODIUM CHLORIDE 0.9 % IV SOLN
2.0000 g | INTRAVENOUS | Status: DC
  Administered 2019-12-24 – 2019-12-30 (×7): 2 g via INTRAVENOUS
  Filled 2019-12-24 (×7): qty 20

## 2019-12-24 MED ORDER — SODIUM CHLORIDE 0.9% FLUSH
3.0000 mL | Freq: Two times a day (BID) | INTRAVENOUS | Status: DC
  Administered 2019-12-24 – 2020-01-08 (×24): 3 mL via INTRAVENOUS

## 2019-12-24 MED ORDER — ONDANSETRON HCL 4 MG/2ML IJ SOLN
4.0000 mg | Freq: Four times a day (QID) | INTRAMUSCULAR | Status: DC | PRN
  Administered 2019-12-28: 4 mg via INTRAVENOUS
  Filled 2019-12-24: qty 2

## 2019-12-24 MED ORDER — SODIUM CHLORIDE 0.9 % IV SOLN
80.0000 mg | Freq: Once | INTRAVENOUS | Status: AC
  Administered 2019-12-24: 80 mg via INTRAVENOUS
  Filled 2019-12-24: qty 80

## 2019-12-24 MED ORDER — THIAMINE HCL 100 MG/ML IJ SOLN: 100.0000 mg | Freq: Every day | INTRAMUSCULAR | Status: DC

## 2019-12-24 MED ORDER — VITAMIN K1 10 MG/ML IJ SOLN
10.0000 mg | Freq: Once | INTRAVENOUS | Status: AC
  Administered 2019-12-24: 10 mg via INTRAVENOUS
  Filled 2019-12-24: qty 1

## 2019-12-24 MED ORDER — LACTULOSE ENEMA: 300.0000 mL | ORAL | Status: DC

## 2019-12-24 MED ORDER — LORAZEPAM 1 MG PO TABS
1.0000 mg | ORAL_TABLET | ORAL | Status: AC | PRN
  Administered 2019-12-25: 1 mg via ORAL
  Administered 2019-12-25: 2 mg via ORAL
  Filled 2019-12-24: qty 2
  Filled 2019-12-24: qty 1

## 2019-12-24 MED ORDER — SODIUM CHLORIDE 0.9 % IV SOLN
8.0000 mg/h | INTRAVENOUS | Status: AC
  Administered 2019-12-24 – 2019-12-27 (×8): 8 mg/h via INTRAVENOUS
  Filled 2019-12-24 (×9): qty 80

## 2019-12-24 MED ORDER — DIPHENHYDRAMINE HCL 50 MG/ML IJ SOLN
50.0000 mg | Freq: Once | INTRAMUSCULAR | Status: AC
  Administered 2019-12-24: 50 mg via INTRAVENOUS
  Filled 2019-12-24: qty 1

## 2019-12-24 MED ORDER — LORAZEPAM 2 MG/ML IJ SOLN
2.0000 mg | Freq: Once | INTRAMUSCULAR | Status: AC
  Administered 2019-12-24: 2 mg via INTRAVENOUS
  Filled 2019-12-24: qty 1

## 2019-12-24 MED ORDER — SODIUM CHLORIDE 0.9 % IV SOLN
INTRAVENOUS | Status: DC
  Filled 2019-12-24 (×2): qty 1000

## 2019-12-24 MED ORDER — LORAZEPAM 2 MG/ML IJ SOLN: 0.0000 mg | Freq: Four times a day (QID) | INTRAMUSCULAR | Status: DC

## 2019-12-24 MED ORDER — LORAZEPAM 1 MG PO TABS: 0.0000 mg | ORAL_TABLET | Freq: Four times a day (QID) | ORAL | Status: DC

## 2019-12-24 MED ORDER — THIAMINE HCL 100 MG PO TABS: 100.0000 mg | ORAL_TABLET | Freq: Every day | ORAL | Status: DC

## 2019-12-24 MED ORDER — THIAMINE HCL 100 MG/ML IJ SOLN
Freq: Every day | INTRAVENOUS | Status: DC
  Filled 2019-12-24 (×3): qty 1000

## 2019-12-24 MED ORDER — LORAZEPAM 1 MG PO TABS: 0.0000 mg | ORAL_TABLET | Freq: Two times a day (BID) | ORAL | Status: DC

## 2019-12-24 MED ORDER — POTASSIUM CHLORIDE IN NACL 40-0.9 MEQ/L-% IV SOLN
INTRAVENOUS | Status: DC
  Administered 2019-12-24 – 2019-12-25 (×3): 100 mL/h via INTRAVENOUS
  Filled 2019-12-24 (×4): qty 1000

## 2019-12-24 MED ORDER — LACTULOSE 10 GM/15ML PO SOLN
20.0000 g | Freq: Once | ORAL | Status: AC
  Administered 2019-12-24: 20 g via ORAL
  Filled 2019-12-24: qty 30

## 2019-12-24 MED ORDER — ZIPRASIDONE MESYLATE 20 MG IM SOLR
INTRAMUSCULAR | Status: AC
  Administered 2019-12-24: 10 mg via INTRAMUSCULAR
  Filled 2019-12-24: qty 20

## 2019-12-24 MED ORDER — LACTULOSE 10 GM/15ML PO SOLN
30.0000 g | Freq: Three times a day (TID) | ORAL | Status: DC
  Filled 2019-12-24 (×2): qty 45

## 2019-12-24 MED ORDER — LACTATED RINGERS IV SOLN: INTRAVENOUS | Status: DC

## 2019-12-24 MED ORDER — FOLIC ACID 1 MG PO TABS: 1.0000 mg | ORAL_TABLET | Freq: Every day | ORAL | Status: DC

## 2019-12-24 MED ORDER — STERILE WATER FOR INJECTION IJ SOLN
INTRAMUSCULAR | Status: AC
  Administered 2019-12-24: 10 mL
  Filled 2019-12-24: qty 10

## 2019-12-24 MED ORDER — POTASSIUM CHLORIDE 10 MEQ/100ML IV SOLN
10.0000 meq | INTRAVENOUS | Status: AC
  Administered 2019-12-24 (×6): 10 meq via INTRAVENOUS
  Filled 2019-12-24 (×3): qty 100

## 2019-12-24 MED ORDER — THIAMINE HCL 100 MG/ML IJ SOLN
100.0000 mg | Freq: Every day | INTRAMUSCULAR | Status: DC
  Administered 2019-12-24: 100 mg via INTRAVENOUS
  Filled 2019-12-24: qty 2

## 2019-12-24 MED ORDER — THIAMINE HCL 100 MG/ML IJ SOLN
Freq: Once | INTRAVENOUS | Status: AC
  Filled 2019-12-24: qty 1000

## 2019-12-24 MED ORDER — SODIUM CHLORIDE 0.9 % IV SOLN
2.0000 g | Freq: Once | INTRAVENOUS | Status: AC
  Administered 2019-12-24: 2 g via INTRAVENOUS
  Filled 2019-12-24: qty 20

## 2019-12-24 MED ORDER — ZIPRASIDONE MESYLATE 20 MG IM SOLR: 10.0000 mg | Freq: Once | INTRAMUSCULAR | Status: AC

## 2019-12-24 MED ORDER — ONDANSETRON HCL 4 MG PO TABS: 4.0000 mg | ORAL_TABLET | Freq: Four times a day (QID) | ORAL | Status: DC | PRN

## 2019-12-24 MED ORDER — LORAZEPAM 2 MG/ML IJ SOLN: 0.0000 mg | Freq: Two times a day (BID) | INTRAMUSCULAR | Status: DC

## 2019-12-24 NOTE — ED Notes (Signed)
Lynett Fish, daughter [phone]

## 2019-12-24 NOTE — Progress Notes (Signed)
Pt admitted to 5W25 from ED. A&O x3. VS stable, Tele monitor placed & verified Skin intact, slightly jaundiced IV R upper arm, L wrist. NS 40K @ 100 & Protonix @ 10 infusing Personal belongings put in Pt belongings bag and locked in dirty utility. Safety sitter at bedside; room checked and all unsafe items removed. Outlets taped, cords taped where possible. Call bell within reach All questions/concerns addressed. Will continue to monitor.     12/24/19 1848  Vitals  BP 101/67  MAP (mmHg) 78  Pulse Rate 91  ECG Heart Rate 90  Resp (!) 23  Oxygen Therapy  SpO2 99 %  O2 Device Room Air  Pain Assessment  Pain Score 0  MEWS Score  MEWS Temp 0  MEWS Systolic 0  MEWS Pulse 0  MEWS RR 1  MEWS LOC 0  MEWS Score 1  MEWS Score Color Green

## 2019-12-24 NOTE — ED Notes (Signed)
Informed pt of need for urine sample, placed pt on purewick for comfort and safety measures. Callbell in reach. Bed in lowest position side rails up.

## 2019-12-24 NOTE — Consult Note (Signed)
[facility] Consult: 9:48 AM 12/24/2019  LOS: 0 days    Referring Provider: Dr Lorin Mercy  Primary Care Physician:  Nelwyn Salisbury, PA-C Primary Gastroenterologist:  Thornton Papas NP    Reason for Consultation:  Alcoholic liver disease   HPI: Joann [name] is a 39 y.o. female.   Many years hx of alcoholic hepatitis, alcoholic pancreatitis, acute intoxication requiring admissions at [facility] and in [place].  Hepatitis serologies negative in 2019.  Hepatomegaly and hepatic steatosis, meld 16, discriminant function 22.7 in July 2020.  Thrombocytopenia.  Alcohol withdrawal seizures.  Has been through multiple inpatient alcohol rehabs/detoxes.    Seen by Roosevelt Locks NP at [place] [facility] for evaluation of liver disease 04/2019.  Referral came from [place] practitioner. Pt advised to stop drinking and seek inpatient detox. Liver biopsy performed 06/2019 which showed mild to moderately active steatohepatitis, grade 1-2 out of 3, NAS score 5 out of 8.  Centrilobular, portal fibrosis with multiple fibrous septa some bridging.  Findings that of mild to moderately active steatohepatitis hepatitis, alcoholic hepatitis with bridging fibrosis.  No changes suggesting autoimmune hepatitis.  Joann husband thinks she's had EGD but calls to [place] as well as [facility] failed to confirm Joann as a patient.. All history comes from a phone call with Joann [name].  Pt binged on Joann usual vodka a week ago Tuesday and has been doing poorly since then with lethargy, confusion, poor balance.  Yesterday she vomited some blood-tinged material after coughing vigorously.  At home she has had this issue before when she coughs paroxysmally and vomits a little bit afterwards.  This was the first time he  saw any blood.  ED charting also mentions patient having a nosebleed. Patient's husband says she has not had any alcohol since last Tuesday and he can confirm this because he has been at home.  However underneath Joann bed he found several empty vodka fifth sized bottles, not clear how long they have been there.  In ED patient was confused slow to respond and had it asterixis and marked jaundice with hypokalemia of 2.3. T bili 28.9.  Alkaline phosphatase 207.  AST/ALT 164/32.  Ammonia 137.  INR 2.4. Hgb 8.2, MCV 118.  Platelets 120. Na 128.   MELD 30 Discr fx 85.     CT head.  Brain normal, and nothing acute. Abdominal ultrasound:  Hepatic steatosis, subltly nodular liver.  Current meds include Rocephin, lactulose enema, Protonix drip, 1 dose vitamin K, runs of IV potassium.  According to husband she does not take any meds at home.     Past Medical History:  Diagnosis Date  . Alcohol abuse   . Anxiety   . Hypertension   . Liver disease     Past Surgical History:  Procedure Laterality Date  . ECTOPIC PREGNANCY SURGERY      Prior to Admission medications   Medication Sig Start Date End Date Taking? Authorizing Provider  hydrOXYzine (ATARAX/VISTARIL) 25 MG tablet Take 25 mg by mouth every 6 (six) hours  as needed for anxiety.   Yes [provider]  Multiple Vitamins-Minerals (MULTIVITAMIN WITH MINERALS) tablet Take 1 tablet by mouth daily.   Yes [provider]  topiramate (TOPAMAX) 200 MG tablet Take 200 mg by mouth 2 (two) times daily. 10/09/19  Yes [provider]  disulfiram (ANTABUSE) 250 MG tablet Take 250 mg by mouth daily.    [provider]  folic acid (FOLVITE) 1 MG tablet Take 1 mg by mouth daily.    [provider]  hydrocortisone 2.5 % lotion Apply topically 2 (two) times daily. Patient not taking: Reported on 12/24/2019 05/31/19   Robyn Haber, MD  ketoconazole (NIZORAL) 2 % shampoo Apply 1 application topically 2 (two)  times a week. Patient not taking: Reported on 12/24/2019 06/01/19   Robyn Haber, MD  Multiple Vitamin (MULTIVITAMIN WITH MINERALS) TABS tablet Take 1 tablet by mouth 2 (two) times a week.     [provider]  traZODone (DESYREL) 50 MG tablet Take 50 mg by mouth at bedtime.    [provider]  venlafaxine (EFFEXOR) 75 MG tablet Take 75 mg by mouth daily.    [provider]    Scheduled Meds: . folic acid  1 mg Oral Daily  . lactulose  300 mL Rectal UD  . multivitamin with minerals  1 tablet Oral Daily  . sodium chloride flush  3 mL Intravenous Q12H  . thiamine  100 mg Oral Daily   Or  . thiamine  100 mg Intravenous Daily   Infusions: . cefTRIAXone (ROCEPHIN)  IV    . lactated ringers    . pantoprozole (PROTONIX) infusion 8 mg/hr (12/24/19 0759)  . phytonadione (VITAMIN K) IV     PRN Meds: LORazepam **OR** LORazepam, ondansetron **OR** ondansetron (ZOFRAN) IV   Allergies as of 12/23/2019  . (No Known Allergies)    Family History  Problem Relation Age of Onset  . Healthy Mother   . Alcohol abuse Mother        quit in Joann 56s  . Alcohol abuse Father   . Diabetes Father   . Healthy Brother   . Hypertension Maternal Grandmother   . Hypertension Maternal Grandfather   . Healthy Brother   . Healthy Brother     Social History   Socioeconomic History  . Marital status: Married    Spouse name: Not on file  . Number of children: Not on file  . Years of education: Not on file  . Highest education level: Not on file  Occupational History  . Occupation: nurse  Tobacco Use  . Smoking status: Never Smoker  . Smokeless tobacco: Never Used  Substance and Sexual Activity  . Alcohol use: Yes    Comment: "I just do it"  . Drug use: Never  . Sexual activity: Yes  Other Topics Concern  . Not on file  Social History Narrative  . Not on file   Social Determinants of Health   Financial Resource Strain:   . Difficulty of Paying Living Expenses:    Food Insecurity:   . Worried About Charity fundraiser in the Last Year:   . Arboriculturist in the Last Year:   Transportation Needs:   . Film/video editor (Medical):   Marland Kitchen Lack of Transportation (Non-Medical):   Physical Activity:   . Days of Exercise per Week:   . Minutes of Exercise per Session:   Stress:   . Feeling of Stress :   Social Connections:   .  Frequency of Communication with Friends and Family:   . Frequency of Social Gatherings with Friends and Family:   . Attends Religious Services:   . Active Member of Clubs or Organizations:   . Attends Archivist Meetings:   Marland Kitchen Marital Status:   Intimate Partner Violence:   . Fear of Current or Ex-Partner:   . Emotionally Abused:   Marland Kitchen Physically Abused:   . Sexually Abused:     REVIEW OF SYSTEMS: Constitutional: See HPI. ENT:  No nose bleeds Pulm: No difficulty breathing. CV:  No palpitations, no LE edema.  No anginal symptoms. GU:  No hematuria, no frequency.   GI: See HPI. Heme: No reports of excessive bleeding or bruising but see details in HPI for recent streaky hematemesis. Transfusions: See HPI. Neuro: Recentlyaltered with confusion.  No headaches, no peripheral tingling or numbness Derm:  No itching, no rash or sores.  Endocrine:  No sweats or chills.  No polyuria or dysuria Immunization:  Unknown.   Travel:  None beyond local counties in last few months.    PHYSICAL EXAM: Vital signs in last 24 hours: Vitals:   12/24/19 0759 12/24/19 0800  BP: 109/69   Pulse: 85 82  Resp:  17  Temp:    SpO2:  98%   Wt Readings from Last 3 Encounters:  07/06/19 63.5 kg  05/31/19 63.5 kg  05/08/19 62.6 kg    General: Jaundice, acutely, chronically ill and cachectic looking.  Unable to provide any history, inconsistently follows commands. Head: No signs of head trauma Eyes:.  Icteric sclera. Ears: No obvious hearing deficit just does not respond to this consistently. Nose: No congestion or  discharge. Mouth: Pharynx. Neck: No JVD, no masses.  No thyromegaly Lungs: No labored breathing or cough.  Lungs clear but breath sounds diminished with poor inspiratory effort. Heart: RRR.  No MRG.  S1, S2 present. Abdomen: Soft, slightly distended but not protuberant.  Do not appreciate organomegaly, bruits, hernias.  Bowel sounds hypoactive..   Rectal: Deferred.  Stool submitted to lab was FOBT positive. Musc/Skeltl: Sarcopenia. Extremities: No peripheral edema. Neurologic: Lethargic difficult to arouse.  Obtunded.  Not able to see Joann name place, year, date.  Inconsistently follows commands but did wiggle Joann fingers, did not Joann arms by Joann side.  Positive asterixis.  No involuntary movement. Skin: Jaundiced Nodes: No cervical adenopathy Psych: Not anxious, not agitated but did receive Geodon earlier.  Intake/Output from previous day: No intake/output data recorded. Intake/Output this shift: Total I/O In: 200 [IV Piggyback:200] Out: -   LAB RESULTS: Recent Labs    12/23/19 2057  WBC 17.6*  HGB 8.2*  HCT 26.4*  PLT 120*   BMET Lab Results  Component Value Date   NA 130 (L) 12/24/2019   NA 128 (L) 12/23/2019   NA 139 08/28/2019   K 2.3 (LL) 12/24/2019   K 2.3 (LL) 12/23/2019   K 3.4 (L) 08/28/2019   CL 89 (L) 12/24/2019   CL 89 (L) 12/23/2019   CL 101 08/28/2019   CO2 17 (L) 12/24/2019   CO2 22 12/23/2019   CO2 18 (L) 08/28/2019   GLUCOSE 126 (H) 12/24/2019   GLUCOSE 107 (H) 12/23/2019   GLUCOSE 134 (H) 08/28/2019   BUN <5 (L) 12/24/2019   BUN <5 (L) 12/23/2019   BUN <5 (L) 08/28/2019   CREATININE 1.10 (H) 12/24/2019   CREATININE 1.07 (H) 12/23/2019   CREATININE 0.55 08/28/2019   CALCIUM 8.5 (L) 12/24/2019   CALCIUM 8.1 (  L) 12/23/2019   CALCIUM 8.7 (L) 08/28/2019   LFT Recent Labs    12/23/19 2057 12/24/19 0130  PROT 8.7* 9.7*  ALBUMIN 1.5* 1.6*  AST 143* 164*  ALT 26 32  ALKPHOS 197* 207*  BILITOT 26.6* 28.9*  BILIDIR  --  15.4*  IBILI  --   13.5*   PT/INR Lab Results  Component Value Date   INR 2.4 (H) 12/24/2019   INR 1.1 07/06/2019   INR 1.2 04/02/2019   Hepatitis Panel No results for input(s): HEPBSAG, HCVAB, HEPAIGM, HEPBIGM in the last 72 hours. C-Diff No components found for: CDIFF Lipase     Component Value Date/Time   LIPASE 18 12/24/2019 0130    Drugs of Abuse     Component Value Date/Time   LABOPIA NONE DETECTED 11/21/2018 2123   COCAINSCRNUR NONE DETECTED 11/21/2018 2123   LABBENZ NONE DETECTED 11/21/2018 2123   AMPHETMU NONE DETECTED 11/21/2018 2123   THCU NONE DETECTED 11/21/2018 2123   LABBARB NONE DETECTED 11/21/2018 2123     RADIOLOGY STUDIES: CT Head Wo Contrast  Result Date: 12/24/2019 CLINICAL DATA:  39 year old female with history of head trauma. Altered mental status. EXAM: CT HEAD WITHOUT CONTRAST TECHNIQUE: Contiguous axial images were obtained from the base of the skull through the vertex without intravenous contrast. COMPARISON:  Head CT 11/21/2018. FINDINGS: Brain: No evidence of acute infarction, hemorrhage, hydrocephalus, extra-axial collection or mass lesion/mass effect. Vascular: No hyperdense vessel or unexpected calcification. Skull: Normal. Negative for fracture or focal lesion. Sinuses/Orbits: No acute finding. Other: None. IMPRESSION: 1. No acute intracranial abnormalities. The appearance of the brain is normal. Electronically Signed   By: Vinnie Langton M.D.   On: 12/24/2019 06:33   US Abdomen Limited RUQ  Result Date: 12/23/2019 CLINICAL DATA:  Elevated liver function tests EXAM: ULTRASOUND ABDOMEN LIMITED RIGHT UPPER QUADRANT COMPARISON:  07/06/2019, 08/29/2018 FINDINGS: Gallbladder: Surgically absent Common bile duct: Diameter: 6 mm Liver: Heterogeneous increased liver echotexture consistent with hepatic steatosis. Slight nodularity of the liver capsule may reflect underlying cirrhosis. No focal liver abnormalities. Portal vein not well visualized due to patient cooperation.  Other: None. IMPRESSION: 1. Cholecystectomy. 2. Hepatic steatosis, with subtle nodularity of the liver capsule which may reflect developing cirrhosis. Electronically Signed   By: Randa Ngo M.D.   On: 12/23/2019 23:29      IMPRESSION:   *   Acute and recurrent ETOH hepatitis.  Cirrhosis suspected.  No ascites on [place].   MELD 30.  Discr fx 85  *   Coagulopathy, INR 2.4  *   Thrombocytopenia at 120.    *   covid 19 negative.    *   Nose bleed, streaky hematemesis following vigorous coughing, FOBT +  *   Macrocytic anemia  *   HE.  Lactulose enemas ordered.    *    Hypokalemia.      PLAN:     *   Supportive care.  ? start prednisolone at 40 mg/day based on high DF score but she has WBC count of 17 K so will d/w Dr Jerilynn Mages.   Follow cmet, INR, ammonia  Eventual EGD but not needed emergently.  Continue abx, PPI drip.  NPO until mental status safe for po and then clears.     Azucena Freed  12/24/2019, 9:48 AM Phone [phone]

## 2019-12-24 NOTE — H&P (Signed)
History and Physical    [name]:[id number] DOB: 03-May-1981 DOA: 12/23/2019  PCP: Nelwyn Salisbury, PA-C Consultants:  Therapist Patient coming from:  Home - lives with husband and 41yo son; Donald Prose: Husband, [phone]  Chief Complaint: AMS  HPI: Joann [name] is a 39 y.o. female with medical history significant of HTN; ETOH dependence with h/o alcoholic pancreatitis, withdrawal seizures; presenting with AMS.  She has failed rehab multiple times.  She was obtunded at the time of my evaluation and unable to provide history but has since become more awake and requesting food.  She apparently binge-drank on Tuesday and has been increasingly confused and lethargic since.    ED Course:  Carryover, per Dr. Cyd Silence:  39 year old female with past medical history of alcohol abuse brought in by husband due to initial concerns for a psychiatric illness. Shortly after arrival it became apparent the patient is suffering from severe hepatic encephalopathy from liver failure. Concerning findings are INR of 2.4, bilirubin of 26.6 and albumin of 1.5.   Furthermore, patient exhibited frank blood on DRE by emergency room provider concerning for active gastrointestinal bleeding.   Initial dose of lactulose already provided by emergency department staff for hepatic encephalopathy.   Patient being hydrated with intravenous isotonic fluids, being provided with intravenous PPI infusion, intravenous octreotide. Dr. Alphonsus Sias with Gastroenterology called and notified of patient's condition and concerns for gastrointestinal bleeding with liver failure and stated they would be happy to consult on patient today.  Review of Systems: Unable to perform    Past Medical History:  Diagnosis Date  . Alcohol abuse   . Anxiety   . Hypertension   . Liver disease     Past Surgical History:  Procedure Laterality Date  . ECTOPIC PREGNANCY SURGERY      Social History   Socioeconomic History  . Marital status:  Married    Spouse name: Not on file  . Number of children: Not on file  . Years of education: Not on file  . Highest education level: Not on file  Occupational History  . Occupation: nurse  Tobacco Use  . Smoking status: Never Smoker  . Smokeless tobacco: Never Used  Substance and Sexual Activity  . Alcohol use: Yes    Comment: "I just do it"  . Drug use: Never  . Sexual activity: Yes  Other Topics Concern  . Not on file  Social History Narrative  . Not on file   Social Determinants of Health   Financial Resource Strain:   . Difficulty of Paying Living Expenses:   Food Insecurity:   . Worried About Charity fundraiser in the Last Year:   . Arboriculturist in the Last Year:   Transportation Needs:   . Film/video editor (Medical):   Marland Kitchen Lack of Transportation (Non-Medical):   Physical Activity:   . Days of Exercise per Week:   . Minutes of Exercise per Session:   Stress:   . Feeling of Stress :   Social Connections:   . Frequency of Communication with Friends and Family:   . Frequency of Social Gatherings with Friends and Family:   . Attends Religious Services:   . Active Member of Clubs or Organizations:   . Attends Archivist Meetings:   Marland Kitchen Marital Status:   Intimate Partner Violence:   . Fear of Current or Ex-Partner:   . Emotionally Abused:   Marland Kitchen Physically Abused:   . Sexually Abused:  No Known Allergies  Family History  Problem Relation Age of Onset  . Healthy Mother   . Alcohol abuse Mother        quit in her 72s  . Alcohol abuse Father   . Diabetes Father   . Healthy Brother   . Hypertension Maternal Grandmother   . Hypertension Maternal Grandfather   . Healthy Brother   . Healthy Brother     Prior to Admission medications   Medication Sig Start Date End Date Taking? Authorizing Provider  folic acid (FOLVITE) 1 MG tablet Take 1 mg by mouth daily.    [provider]  hydrocortisone 2.5 % lotion Apply topically 2 (two)  times daily. 05/31/19   Robyn Haber, MD  hydrOXYzine (ATARAX/VISTARIL) 25 MG tablet Take 25 mg by mouth every 6 (six) hours as needed for anxiety.    [provider]  ketoconazole (NIZORAL) 2 % shampoo Apply 1 application topically 2 (two) times a week. 06/01/19   Robyn Haber, MD  Multiple Vitamin (MULTIVITAMIN WITH MINERALS) TABS tablet Take 1 tablet by mouth 2 (two) times a week.     [provider]  traZODone (DESYREL) 50 MG tablet Take 50 mg by mouth at bedtime.    [provider]  venlafaxine (EFFEXOR) 75 MG tablet Take 75 mg by mouth daily.    [provider]    Physical Exam: Vitals:   12/24/19 1600 12/24/19 1700 12/24/19 1730 12/24/19 1800  BP: 98/71 (!) 120/100 (!) 107/56 113/84  Pulse:   97   Resp: (!) 22 19 19  (!) 22  Temp:      TempSrc:      SpO2:   97%      . General:  Obtunded - responsive to loud voice and touch and minimally verbal . Eyes:  PERRL, EOMI, normal lids, iris . ENT:  grossly normal hearing, lips & tongue . Neck:  no LAD, masses or thyromegaly . Cardiovascular:  RRR, no m/r/g. No LE edema.  Marland Kitchen Respiratory:   CTA bilaterally with no wheezes/rales/rhonchi.  Mildly increased respiratory effort. . Abdomen:  protuberant but without apparent fluid wave . Skin:  no rash or induration seen on limited exam . Musculoskeletal:  grossly normal tone BUE/BLE, no bony abnormality . Lower extremity:  No LE edema.  Limited foot exam with no ulcerations.  2+ distal pulses. Marland Kitchen Psychiatric:  Obtunded . Neurologic:  Unable to perform    Radiological Exams on Admission: CT Head Wo Contrast  Result Date: 12/24/2019 CLINICAL DATA:  39 year old female with history of head trauma. Altered mental status. EXAM: CT HEAD WITHOUT CONTRAST TECHNIQUE: Contiguous axial images were obtained from the base of the skull through the vertex without intravenous contrast. COMPARISON:  Head CT 11/21/2018. FINDINGS: Brain: No evidence of acute  infarction, hemorrhage, hydrocephalus, extra-axial collection or mass lesion/mass effect. Vascular: No hyperdense vessel or unexpected calcification. Skull: Normal. Negative for fracture or focal lesion. Sinuses/Orbits: No acute finding. Other: None. IMPRESSION: 1. No acute intracranial abnormalities. The appearance of the brain is normal. Electronically Signed   By: Vinnie Langton M.D.   On: 12/24/2019 06:33   US Abdomen Limited RUQ  Result Date: 12/23/2019 CLINICAL DATA:  Elevated liver function tests EXAM: ULTRASOUND ABDOMEN LIMITED RIGHT UPPER QUADRANT COMPARISON:  07/06/2019, 08/29/2018 FINDINGS: Gallbladder: Surgically absent Common bile duct: Diameter: 6 mm Liver: Heterogeneous increased liver echotexture consistent with hepatic steatosis. Slight nodularity of the liver capsule may reflect underlying cirrhosis. No focal liver abnormalities. Portal vein not well  visualized due to patient cooperation. Other: None. IMPRESSION: 1. Cholecystectomy. 2. Hepatic steatosis, with subtle nodularity of the liver capsule which may reflect developing cirrhosis. Electronically Signed   By: Randa Ngo M.D.   On: 12/23/2019 23:29    EKG: Independently reviewed.  NSR with rate ; nonspecific ST changes with no evidence of acute ischemia   Labs on Admission: I have personally reviewed the available labs and imaging studies at the time of the admission.  Pertinent labs:   Na++ 128 -> 135 K+ 2.3 -> 2.9 Glucose 107 BUN <5/Creatinine 1.07/GFR >60; prior creatinine was 0.55 in 08/2019 AP 207 - stable Albumin 1.6 AST 164/ALT 32/Bili 28.9 - prior bili 5.2 in 12/20 WBC 17.6 Hgb 8.2 -> 7.7 Platelets 120 -> 100 INR 2.6 APAP negative ASA 12.5 HCG negative ETOH 13 Heme positive NH4 137   Assessment/Plan Principal Problem:   Hepatic encephalopathy (HCC) Active Problems:   Alcohol dependence with withdrawal, unspecified (HCC)   Thrombocytopenia (HCC)   Hypokalemia due to inadequate potassium  intake   Alcoholic hepatitis without ascites   Hepatic encephalopathy -Patient with known alcoholic hepatitis, appears to now have decompensated cirrhosis  -Presenting with several days of worsening MS -Ammonia 137 -Very likely hepatic encephalopathy; treatment with lactulose today has led to improving mental status -No apparent s/sx of infection, but with elevated WBC count there is concern for SBP so will continue Rocephin -She needs empiric treatment for encephalopathy with lactulose titrated to 2-3 soft stools/day -May benefit from Rifaximin -If this is the cause, would anticipate improvement in mental status in the next 12-24 hours -If not improving, he likely needs a more extensive evaluation for causes of his AMS -She desperately needs to stop drinking -Safety sitting this AM due to obtundation -Admit to progressive care unit for close ongoing monitoring  Cirrhosis, ?varices -MELD/MELD-Na score is 31/33, with a mortality rate of 52.6% -She must stop drinking -GI consult -Concern for possible varices and so clear liquids (given regular diet earlier diet inadvertently when she was demanding food); likely needs EGD -Protonix drip for now  ETOH dependence -Patient with chronic ETOH dependence -Has failed rehab multiple times -She is at high risk for complications of withdrawal including seizures, DTs -CIWA protocol -TOC consult for possible inpatient treatment  Thrombocytopenia - Due to bone marrow suppression from alcohol abuse - Monitor daily platelet count  - Hold Lovenox and use SCDs  Hypokalemia -Repleting     Note: This patient has been tested and is negative for the novel coronavirus COVID-19.  DVT prophylaxis:  SCDs Code Status:  Full  Family Communication: None present; GI spoke with the patient's husband by telephone at the time of admission. Disposition Plan:  The patient is from: home  Anticipated d/c is to: home without [facility] services; admission to  inpatient substance abuse rehab would once again be ideal  Anticipated d/c date will depend on clinical response to treatment, likely several days for ongoing stabilization  Patient is currently: acutely ill Consults called: GI; TOC team Admission status:  Admit - It is my clinical opinion that admission to [facility] is reasonable and necessary because of the expectation that this patient will require hospital care that crosses at least 2 midnights to treat this condition based on the medical complexity of the problems presented.  Given the aforementioned information, the predictability of an adverse outcome is felt to be significant.    Karmen Bongo MD [facility]   How to contact the [facility] Attending or Consulting  provider [place] or covering provider during after hours [place], for this patient?  1. Check the care team in [facility] and look for a) attending/consulting [facility] provider listed and b) the [facility] team listed 2. Log into [email or web address] and use 's universal password to access. If you do not have the password, please contact the hospital operator. 3. Locate the [facility] provider you are looking for under [facility] and page to a number that you can be directly reached. 4. If you still have difficulty reaching the provider, please page the [facility] (Director on Call) for the Hospitalists listed on amion for assistance.   12/24/2019, 6:37 PM

## 2019-12-24 NOTE — ED Provider Notes (Signed)
[facility] EMERGENCY DEPARTMENT Provider Note   CSN: [id number] Arrival date & time: 12/23/19  2009     History Chief Complaint  Patient presents with  . Psychiatric Evaluation  . Alcohol Problem    Joann [name] is a 39 y.o. female with a h/o alcohol use disorder, alcoholic hepatitis, alcohol induced pancreatitis who presents to the emergency department with altered mental status.  Patient responds "Yes" or "I'm okay" to all questions asked.  She is not oriented to person, self, place.  No family is at bedside.  Per triage note "Per husband pt is a binge drinker and has not had a drink in a week, since then pt has not been eating and drinking and has lost a lot a wegiht, pt is refusing to talk, pt repeats, "Im ok" pt hold emesis bag to face as if she is nauseated. Denies SI, hesitates when questioned about HI thoughts but denies."   The history is provided by medical records. The history is limited by the condition of the patient. No language interpreter was used.       Past Medical History:  Diagnosis Date  . Alcohol abuse   . Anxiety   . Hypertension   . Liver disease     Patient Active Problem List   Diagnosis Date Noted  . Alcoholic hepatitis without ascites   . Hypokalemia due to inadequate potassium intake 04/02/2019  . Alcohol withdrawal ([place]) 10/24/2018  . Alcohol dependence with withdrawal, unspecified ([place]) 10/24/2018  . Elevated LFTs 10/24/2018  . Thrombocytopenia ([place]) 10/24/2018  . Alcohol-induced acute pancreatitis   . Acute blood loss anemia   . Alcoholic ketoacidosis 85/88/5027  . Alcohol abuse with alcohol-induced mood disorder ([place]) 09/14/2017  . Elevated liver enzymes 04/29/2017    Past Surgical History:  Procedure Laterality Date  . ECTOPIC PREGNANCY SURGERY       OB History   No obstetric history on file.     Family History  Problem Relation Age of Onset  . Healthy Mother   . Alcohol abuse Mother        quit in  her 20s  . Alcohol abuse Father   . Diabetes Father   . Healthy Brother   . Hypertension Maternal Grandmother   . Hypertension Maternal Grandfather   . Healthy Brother   . Healthy Brother     Social History   Tobacco Use  . Smoking status: Never Smoker  . Smokeless tobacco: Never Used  Substance Use Topics  . Alcohol use: Yes    Comment: "I just do it"  . Drug use: Never    Home Medications Prior to Admission medications   Medication Sig Start Date End Date Taking? Authorizing Provider  folic acid (FOLVITE) 1 MG tablet Take 1 mg by mouth daily.    [provider]  hydrocortisone 2.5 % lotion Apply topically 2 (two) times daily. 05/31/19   Robyn Haber, MD  hydrOXYzine (ATARAX/VISTARIL) 25 MG tablet Take 25 mg by mouth every 6 (six) hours as needed for anxiety.    [provider]  ketoconazole (NIZORAL) 2 % shampoo Apply 1 application topically 2 (two) times a week. 06/01/19   Robyn Haber, MD  Multiple Vitamin (MULTIVITAMIN WITH MINERALS) TABS tablet Take 1 tablet by mouth 2 (two) times a week.     [provider]  traZODone (DESYREL) 50 MG tablet Take 50 mg by mouth at bedtime.    [provider]  venlafaxine (EFFEXOR) 75 MG tablet  Take 75 mg by mouth daily.    [provider]    Allergies    Patient has no known allergies.  Review of Systems   Review of Systems  Unable to perform ROS: Mental status change    Physical Exam Updated Vital Signs BP 104/65 (BP Location: Right Arm)   Pulse 91   Temp 98.2 F (36.8 C) (Oral)   Resp 17   SpO2 98%   Physical Exam Constitutional:      General: She is not in acute distress.    Appearance: She is not ill-appearing.  Eyes:     General: Scleral icterus present.     Extraocular Movements: Extraocular movements intact.     Pupils: Pupils are equal, round, and reactive to light.  Cardiovascular:     Rate and Rhythm: Normal rate and regular rhythm.     Pulses: Normal  pulses.     Heart sounds: Normal heart sounds. No murmur. No friction rub. No gallop.   Pulmonary:     Effort: No respiratory distress.     Breath sounds: No stridor. No wheezing, rhonchi or rales.  Chest:     Chest wall: No tenderness.  Abdominal:     General: There is distension.     Tenderness: There is no abdominal tenderness. There is no right CVA tenderness or guarding.     Comments: Abdomen is distended, but soft.  No tenderness to palpation throughout the abdomen.  No rebound or guarding.  Normoactive bowel sounds.  No peritoneal signs. + Asterixis.  Musculoskeletal:        General: No swelling.     Right lower leg: No edema.     Left lower leg: No edema.  Skin:    Coloration: Skin is jaundiced.  Neurological:     Mental Status: She is alert.     Comments: Intermittently follows simple commands.  She is confused and is not oriented to person, place, or time. "Repeats "Yes or I'm okay" to all inquiries.      ED Results / Procedures / Treatments   Labs (all labs ordered are listed, but only abnormal results are displayed) Labs Reviewed  COMPREHENSIVE METABOLIC PANEL - Abnormal; Notable for the following components:      Result Value   Sodium 128 (*)    Potassium 2.3 (*)    Chloride 89 (*)    Glucose, Bld 107 (*)    BUN <5 (*)    Creatinine, Ser 1.07 (*)    Calcium 8.1 (*)    Total Protein 8.7 (*)    Albumin 1.5 (*)    AST 143 (*)    Alkaline Phosphatase 197 (*)    Total Bilirubin 26.6 (*)    Anion gap 17 (*)    All other components within normal limits  ETHANOL - Abnormal; Notable for the following components:   Alcohol, Ethyl (B) 13 (*)    All other components within normal limits  ACETAMINOPHEN LEVEL - Abnormal; Notable for the following components:   Acetaminophen (Tylenol), Serum <10 (*)    All other components within normal limits  CBC - Abnormal; Notable for the following components:   WBC 17.6 (*)    RBC 2.23 (*)    Hemoglobin 8.2 (*)    HCT 26.4 (*)     MCV 118.4 (*)    MCH 36.8 (*)    RDW 21.4 (*)    Platelets 120 (*)    nRBC 0.3 (*)    All  other components within normal limits  PROTIME-INR - Abnormal; Notable for the following components:   Prothrombin Time 25.7 (*)    INR 2.4 (*)    All other components within normal limits  APTT - Abnormal; Notable for the following components:   aPTT 48 (*)    All other components within normal limits  AMMONIA - Abnormal; Notable for the following components:   Ammonia 137 (*)    All other components within normal limits  HEPATIC FUNCTION PANEL - Abnormal; Notable for the following components:   Total Protein 9.7 (*)    Albumin 1.6 (*)    AST 164 (*)    Alkaline Phosphatase 207 (*)    Total Bilirubin 28.9 (*)    Bilirubin, Direct 15.4 (*)    Indirect Bilirubin 13.5 (*)    All other components within normal limits  POC OCCULT BLOOD, ED - Abnormal; Notable for the following components:   Fecal Occult Bld POSITIVE (*)    All other components within normal limits  RESPIRATORY PANEL BY RT PCR (FLU A&B, COVID)  SARS CORONAVIRUS 2 BY RT PCR (DIASORIN)  SALICYLATE LEVEL  LIPASE, BLOOD  MAGNESIUM  RAPID URINE DRUG SCREEN, HOSP PERFORMED  I-STAT BETA HCG BLOOD, ED ([facility], [facility], [facility] ONLY)  TYPE AND SCREEN    EKG EKG Interpretation  Date/Time:  Thursday December 24 2019 03:12:13 EDT Ventricular Rate:  100 PR Interval:    QRS Duration: 164 QT Interval:  303 QTC Calculation: 391 R Axis:   46 Text Interpretation: Sinus tachycardia Nonspecific intraventricular conduction delay Borderline abnrm T, anterolateral leads Artifact No old tracing to compare Confirmed by Delora Fuel ([id number]) on 12/24/2019 3:18:00 AM   Radiology CT Head Wo Contrast  Result Date: 12/24/2019 CLINICAL DATA:  39 year old female with history of head trauma. Altered mental status. EXAM: CT HEAD WITHOUT CONTRAST TECHNIQUE: Contiguous axial images were obtained from the base of the skull through the vertex without intravenous  contrast. COMPARISON:  Head CT 11/21/2018. FINDINGS: Brain: No evidence of acute infarction, hemorrhage, hydrocephalus, extra-axial collection or mass lesion/mass effect. Vascular: No hyperdense vessel or unexpected calcification. Skull: Normal. Negative for fracture or focal lesion. Sinuses/Orbits: No acute finding. Other: None. IMPRESSION: 1. No acute intracranial abnormalities. The appearance of the brain is normal. Electronically Signed   By: Vinnie Langton M.D.   On: 12/24/2019 06:33   US Abdomen Limited RUQ  Result Date: 12/23/2019 CLINICAL DATA:  Elevated liver function tests EXAM: ULTRASOUND ABDOMEN LIMITED RIGHT UPPER QUADRANT COMPARISON:  07/06/2019, 08/29/2018 FINDINGS: Gallbladder: Surgically absent Common bile duct: Diameter: 6 mm Liver: Heterogeneous increased liver echotexture consistent with hepatic steatosis. Slight nodularity of the liver capsule may reflect underlying cirrhosis. No focal liver abnormalities. Portal vein not well visualized due to patient cooperation. Other: None. IMPRESSION: 1. Cholecystectomy. 2. Hepatic steatosis, with subtle nodularity of the liver capsule which may reflect developing cirrhosis. Electronically Signed   By: Randa Ngo M.D.   On: 12/23/2019 23:29    Procedures .Critical Care Performed by: Joanne Gavel, PA-C Authorized by: Joanne Gavel, PA-C   Critical care provider statement:    Critical care time (minutes):  65   Critical care time was exclusive of:  Separately billable procedures and treating other patients and teaching time   Critical care was necessary to treat or prevent imminent or life-threatening deterioration of the following conditions:  Hepatic failure   Critical care was time spent personally by me on the following activities:  Ordering and performing treatments  and interventions, ordering and review of laboratory studies, ordering and review of radiographic studies, pulse oximetry, re-evaluation of patient's condition,  review of old charts, obtaining history from patient or surrogate, examination of patient, evaluation of patient's response to treatment, discussions with consultants and development of treatment plan with patient or surrogate   I assumed direction of critical care for this patient from another provider in my specialty: no     (including critical care time)  Medications Ordered in ED Medications  LORazepam (ATIVAN) injection 0-4 mg (0 mg Intravenous Hold 12/24/19 0306)    Or  LORazepam (ATIVAN) tablet 0-4 mg ( Oral See Alternative 12/24/19 0306)  LORazepam (ATIVAN) injection 0-4 mg (has no administration in time range)    Or  LORazepam (ATIVAN) tablet 0-4 mg (has no administration in time range)  thiamine tablet 100 mg (has no administration in time range)    Or  thiamine (B-1) injection 100 mg (has no administration in time range)  pantoprazole (PROTONIX) 80 mg in sodium chloride 0.9 % 100 mL IVPB (80 mg Intravenous New Bag/Given 12/24/19 0639)  pantoprazole (PROTONIX) 80 mg in sodium chloride 0.9 % 100 mL (0.8 mg/mL) infusion (has no administration in time range)  lactated ringers 1,000 mL with thiamine 993 mg, folic acid 1 mg, multivitamins adult 10 mL, potassium chloride 40 mEq, magnesium sulfate 2 g infusion ( Intravenous Stopped 12/24/19 0641)  lactulose (CHRONULAC) 10 GM/15ML solution 20 g (20 g Oral Given 12/24/19 0324)  LORazepam (ATIVAN) injection 2 mg (2 mg Intravenous Given 12/24/19 0303)  diphenhydrAMINE (BENADRYL) injection 50 mg (50 mg Intravenous Given 12/24/19 0303)  cefTRIAXone (ROCEPHIN) 2 g in sodium chloride 0.9 % 100 mL IVPB (2 g Intravenous New Bag/Given 12/24/19 0613)  ziprasidone (GEODON) injection 10 mg (10 mg Intramuscular Given 12/24/19 0452)  sterile water (preservative free) injection (10 mLs  Given 12/24/19 0458)    ED Course  I have reviewed the triage vital signs and the nursing notes.  Pertinent labs & imaging results that were available during my care of the  patient were reviewed by me and considered in my medical decision making (see chart for details).  Clinical Course as of Dec 24 646  Thu Dec 24, 2019  0047 Unsuccessful attempt to contact the patient's husband.  No family is at bedside.   [MM]  0154 Patient has noted to be wandering from the room.  She is now telling staff that she wants to leave the ER and go home.  Attempted verbal redirection without improvement.  Security is at bedside.  Will order Ativan and Benadryl for sedation as she is becoming increasingly agitated toward staff.  IVC paperwork has been completed by Dr. Roxanne Mins, attending physician.   [MM]  0500 Patient recheck.  She is able to state that the year is 21.  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  She is able to state her entire birthday. She will intermittent    [MM]  [phone] Notified by nursing staff that she had an unwitnessed fall and was found on the floor beside the bed.  Will order head CT.   [MM]    Clinical Course User Index [MM] Tim Wilhide, Laymond Purser, PA-C   MDM Rules/Calculators/A&P                      39 year old female with a h/o alcohol use disorder, alcoholic hepatitis, alcohol induced pancreatitis presenting with altered mental status.  No additional history is able to  be ascertained from the patient.  No family is at bedside.  Vital signs are unremarkable.  On exam, the patient is deeply jaundiced and has scleral icterus. There is asterixis.  She has ascites and abdomen is distended, but soft.  She has no tenderness to palpation on abdominal exam.  She is encephalopathic, but alert. She will repeat "yes or I'm okay" to all questions.  The patient was seen and independently evaluated by Dr. Roxanne Mins, attending physician.  Per chart review, the patient has a longstanding history of alcohol use disorder.  Ethanol level is 13 in the ER.  Last drink was unknown.  Could consider Warnicke's encephalopathy.  Will order a banana bag and CIWA protocol.  However, more  likely encephalopathy is secondary to acute liver failure.  After initial evaluation of the patient, the patient began to become increasingly agitated and was requesting to leave the ER.  She continues to be confused.  Dr. Roxanne Mins took a IVC paperwork on the patient's since she does not currently have capacity at this time.   Labs are otherwise notable for INR of 2.4, PTT of 48, total bilirubin of 28.9, markedly elevated from previous.  AST is elevated at 164.  ALT is normal at 32.  Tylenol level is not elevated. Right upper quadrant ultrasound demonstrating hepatic steatosis with subtle nodularity of the liver capsule that is thought to be developing cirrhosis.  She had a liver biopsy in October 2020 that did not show any evidence of autoimmune hepatitis with concerning for mild to moderate hepatic steatosis.  She has a leukocytosis of 17,000.  No other infectious symptoms.  This could be secondary to acute liver failure versus infectious etiology.  No documented history of varices, but we will give the patient a dose of Rocephin since this has been demonstrated to improve outcomes for patients with bleeding varices in recent studies.  There is a 1-2 g drop in hemoglobin from previous.  Currently 8.2.  Patient has been agitated since arrival in the ER and rectal exam has been deferred.  After receiving Geodon, she continues to protect her airway, but is no longer agitated.  Digital rectal exam with bright red blood per rectum.  Patient's vital signs continue to be normal.  Will defer blood products at this time she continues to be hemodynamically stable.  Spoke with Lennette Bihari, pharmacist, at [facility] given limited history since the patient is encephalopathic.  Patient has previously been seen for alcohol induced mood disorder and triage note was suggestive of the patient needing a behavioral health consult. Initial Tylenol level was not elevated.  Lennette Bihari recommends repeating Tylenol level.  In instances of  Tylenol ingestion, typically AST will elevate first followed by ALT.  If repeat hepatic function panels indicate that ALT is starting to elevate, this should give a stronger suspicion for a possible Tylenol ingestion.  NAC is not indicated at this time.   Consulted the hospitalist team as the patient is critically ill and will require admission. Dr Cyd Silence will accept the patient for admission.  He requests GI consult and I have spoken with Dr. Fuller Plan.  The patient will be evaluated by the morning gastroenterology team at shift change.  After admitting the patient to the hospitalist team, notified by nursing staff that the patient had an unwitnessed fall and was found in the floor of the room.  Will order CT head.  Hospitalist has been made aware.  The patient appears reasonably stabilized for admission considering the current resources,  flow, and capabilities available in the ED at this time, and I doubt any other [facility] requiring further screening and/or treatment in the ED prior to admission.  Final Clinical Impression(s) / ED Diagnoses Final diagnoses:  Elevated bilirubin    Rx / DC Orders ED Discharge Orders    None       Joanne Gavel, PA-C 40/45/91 [id number]    Delora Fuel, MD 99/23/41 [phone]

## 2019-12-25 ENCOUNTER — Inpatient Hospital Stay (HOSPITAL_COMMUNITY): Payer: Medicaid Other

## 2019-12-25 ENCOUNTER — Encounter (HOSPITAL_COMMUNITY)
Admission: EM | Disposition: A | Discharge status: Rehabilitation Facility | Payer: Self-pay | Source: Home / Self Care | Attending: Internal Medicine

## 2019-12-25 DIAGNOSIS — D649 Anemia, unspecified: Secondary | ICD-10-CM | POA: Diagnosis not present

## 2019-12-25 DIAGNOSIS — K7010 Alcoholic hepatitis without ascites: Secondary | ICD-10-CM | POA: Diagnosis not present

## 2019-12-25 DIAGNOSIS — K7290 Hepatic failure, unspecified without coma: Secondary | ICD-10-CM | POA: Diagnosis not present

## 2019-12-25 DIAGNOSIS — D696 Thrombocytopenia, unspecified: Secondary | ICD-10-CM | POA: Diagnosis not present

## 2019-12-25 DIAGNOSIS — F10239 Alcohol dependence with withdrawal, unspecified: Secondary | ICD-10-CM | POA: Diagnosis not present

## 2019-12-25 LAB — IRON AND TIBC
Iron: 74 ug/dL (ref 28–170)
Saturation Ratios: 56 % — ABNORMAL HIGH (ref 10.4–31.8)
TIBC: 133 ug/dL — ABNORMAL LOW (ref 250–450)
UIBC: 59 ug/dL

## 2019-12-25 LAB — COMPREHENSIVE METABOLIC PANEL
ALT: 26 U/L (ref 0–44)
AST: 152 U/L — ABNORMAL HIGH (ref 15–41)
Albumin: 1.3 g/dL — ABNORMAL LOW (ref 3.5–5.0)
Alkaline Phosphatase: 163 U/L — ABNORMAL HIGH (ref 38–126)
Anion gap: 8 (ref 5–15)
BUN: <5 mg/dL — ABNORMAL LOW (ref 6–20)
CO2: 20 mmol/L — ABNORMAL LOW (ref 22–32)
Calcium: 7.5 mg/dL — ABNORMAL LOW (ref 8.9–10.3)
Chloride: 104 mmol/L (ref 98–111)
Creatinine, Ser: 0.75 mg/dL (ref 0.44–1.00)
GFR calc Af Amer: >60 mL/min (ref >60)
GFR calc non Af Amer: >60 mL/min (ref >60)
Glucose, Bld: 92 mg/dL (ref 70–99)
Potassium: 3.7 mmol/L (ref 3.5–5.1)
Sodium: 132 mmol/L — ABNORMAL LOW (ref 135–145)
Total Bilirubin: 21.8 mg/dL (ref 0.3–1.2)
Total Protein: 7.3 g/dL (ref 6.5–8.1)

## 2019-12-25 LAB — CBC
HCT: 22.8 % — ABNORMAL LOW (ref 36.0–46.0)
HCT: 25.4 % — ABNORMAL LOW (ref 36.0–46.0)
Hemoglobin: 7.1 g/dL — ABNORMAL LOW (ref 12.0–15.0)
Hemoglobin: 7.8 g/dL — ABNORMAL LOW (ref 12.0–15.0)
MCH: 36.6 pg — ABNORMAL HIGH (ref 26.0–34.0)
MCH: 37.3 pg — ABNORMAL HIGH (ref 26.0–34.0)
MCHC: 31.1 g/dL (ref 30.0–36.0)
MCHC: 30.7 g/dL (ref 30.0–36.0)
MCV: 117.5 fL — ABNORMAL HIGH (ref 80.0–100.0)
MCV: 121.5 fL — ABNORMAL HIGH (ref 80.0–100.0)
Platelets: 85 10*3/uL — ABNORMAL LOW (ref 150–400)
Platelets: 91 10*3/uL — ABNORMAL LOW (ref 150–400)
RBC: 1.94 MIL/uL — ABNORMAL LOW (ref 3.87–5.11)
RBC: 2.09 MIL/uL — ABNORMAL LOW (ref 3.87–5.11)
RDW: 21.2 % — ABNORMAL HIGH (ref 11.5–15.5)
RDW: 20.8 % — ABNORMAL HIGH (ref 11.5–15.5)
WBC: 17.8 10*3/uL — ABNORMAL HIGH (ref 4.0–10.5)
WBC: 20.2 10*3/uL — ABNORMAL HIGH (ref 4.0–10.5)
nRBC: 0.3 % — ABNORMAL HIGH (ref 0.0–0.2)
nRBC: 0.1 % (ref 0.0–0.2)

## 2019-12-25 LAB — FERRITIN: Ferritin: 234 ng/mL (ref 11–307)

## 2019-12-25 LAB — FOLATE: Folate: 21.3 ng/mL (ref >5.9)

## 2019-12-25 LAB — PROTIME-INR
INR: 2.3 — ABNORMAL HIGH (ref 0.8–1.2)
Prothrombin Time: 25.2 seconds — ABNORMAL HIGH (ref 11.4–15.2)

## 2019-12-25 LAB — RETICULOCYTES
Immature Retic Fract: 27.5 % — ABNORMAL HIGH (ref 2.3–15.9)
RBC.: 2.08 MIL/uL — ABNORMAL LOW (ref 3.87–5.11)
Retic Count, Absolute: 128.8 10*3/uL (ref 19.0–186.0)
Retic Ct Pct: 6.2 % — ABNORMAL HIGH (ref 0.4–3.1)

## 2019-12-25 LAB — VITAMIN B12: Vitamin B-12: 4128 pg/mL — ABNORMAL HIGH (ref 180–914)

## 2019-12-25 SURGERY — EGD (ESOPHAGOGASTRODUODENOSCOPY)
Anesthesia: Monitor Anesthesia Care

## 2019-12-25 MED ORDER — THIAMINE HCL 100 MG/ML IJ SOLN
100.0000 mg | Freq: Every day | INTRAMUSCULAR | Status: DC
  Administered 2019-12-25 – 2019-12-31 (×7): 100 mg via INTRAVENOUS
  Filled 2019-12-25 (×7): qty 2

## 2019-12-25 MED ORDER — ADULT MULTIVITAMIN W/MINERALS CH
1.0000 | ORAL_TABLET | Freq: Every day | ORAL | Status: DC
  Administered 2019-12-25 – 2019-12-30 (×6): 1 via ORAL
  Filled 2019-12-25 (×6): qty 1

## 2019-12-25 MED ORDER — SODIUM CHLORIDE 0.9 % IV SOLN
50.0000 ug/h | INTRAVENOUS | Status: DC
  Administered 2019-12-25 – 2019-12-28 (×7): 50 ug/h via INTRAVENOUS
  Filled 2019-12-25 (×10): qty 1

## 2019-12-25 MED ORDER — OCTREOTIDE LOAD VIA INFUSION
50.0000 ug | Freq: Once | INTRAVENOUS | Status: AC
  Administered 2019-12-25: 50 ug via INTRAVENOUS
  Filled 2019-12-25: qty 25

## 2019-12-25 MED ORDER — LACTULOSE 10 GM/15ML PO SOLN
30.0000 g | ORAL | Status: DC
  Administered 2019-12-25 (×4): 30 g via ORAL
  Filled 2019-12-25 (×4): qty 45

## 2019-12-25 MED ORDER — CHLORDIAZEPOXIDE HCL 5 MG PO CAPS
15.0000 mg | ORAL_CAPSULE | Freq: Three times a day (TID) | ORAL | Status: DC
  Administered 2019-12-25 (×3): 15 mg via ORAL
  Filled 2019-12-25 (×3): qty 3

## 2019-12-25 MED ORDER — LACTULOSE 10 GM/15ML PO SOLN
30.0000 g | Freq: Three times a day (TID) | ORAL | Status: DC
  Administered 2019-12-26 (×2): 30 g via ORAL
  Filled 2019-12-25 (×2): qty 45

## 2019-12-25 MED ORDER — VITAMIN K1 10 MG/ML IJ SOLN
10.0000 mg | Freq: Once | INTRAVENOUS | Status: AC
  Administered 2019-12-25: 10 mg via INTRAVENOUS
  Filled 2019-12-25: qty 1

## 2019-12-25 NOTE — Progress Notes (Signed)
Brief GI progress note  Patient evaluated early this morning.  Not having bowel movements.  Not drinking lactulose significantly as of yet.  Appears to be tremulous and potentially withdrawing from alcohol as well.  She is not a candidate for endoscopy today. I would still continue her PPI as well as octreotide for now. I would let her be on full liquid diet at this time. We will consider role of endoscopy in the next 1 to 2 days.  Formal progress note to be placed later this morning.  Justice Britain, MD [facility] Office # [phone]

## 2019-12-25 NOTE — Progress Notes (Signed)
Daily Rounding Note  12/25/2019, 11:52 AM  LOS: 1 day   SUBJECTIVE:   Chief complaint:   Acute recurrent etoh hepatitis.         2 brown, loose stools this AM, one large and one smaller. Denies abdominal pain.  Denies vomiting.  Would love to be able to eat solid food. No fevers.  OBJECTIVE:         Vital signs in last 24 hours:    Temp:  [97.8 F (36.6 C)-98.2 F (36.8 C)] 98.1 F (36.7 C) (04/16 0830) Pulse Rate:  [82-102] 93 (04/16 1141) Resp:  [15-25] 23 (04/16 1141) BP: (87-120)/(56-100) 107/85 (04/16 1141) SpO2:  [88 %-100 %] 95 % (04/16 1141) Last BM Date: 12/25/19 There were no vitals filed for this visit. General: Tremulous, jaundiced, looks unwell.  More alert this morning. Heart: RRR. Chest: Diminished breath sounds throughout.  No cough, no labored breathing. Abdomen: Slightly protuberant but soft, not tender.  Active bowel sounds. ~100 cc of tea colored urine in canister. Extremities: No CCE.  Overall muscle wasting. Neuro/Psych: Alert.  Appropriate.  Follows all commands.  Speech is a little bit halting but clear, easily understood.  Tremulous.  Intake/Output from previous day: 04/15 0701 - 04/16 0700 In: 1859.2 [I.V.:1110; IV Piggyback:749.2] Out: 450 [Urine:450]  Intake/Output this shift: No intake/output data recorded.  Lab Results: Recent Labs    12/23/19 2057 12/24/19 0919 12/25/19 0415  WBC 17.6* 17.4* 17.8*  HGB 8.2* 7.7* 7.1*  HCT 26.4* 24.3* 22.8*  PLT 120* 100* 85*   BMET Recent Labs    12/24/19 0130 12/24/19 0919 12/25/19 0415  NA 130* 135 132*  K 2.3* 2.9* 3.7  CL 89* 96* 104  CO2 17* 23 20*  GLUCOSE 126* 105* 92  BUN <5* <5* <5*  CREATININE 1.10* 0.84 0.75  CALCIUM 8.5* 8.3* 7.5*   LFT Recent Labs    12/24/19 0130 12/24/19 0919 12/25/19 0415  PROT 9.7* 8.2* 7.3  ALBUMIN 1.6* 1.5* 1.3*  AST 164* 166* 152*  ALT 32 33 26  ALKPHOS 207* 183* 163*  BILITOT  28.9* 24.3* 21.8*  BILIDIR 15.4*  --   --   IBILI 13.5*  --   --    PT/INR Recent Labs    12/24/19 0919 12/25/19 0415  LABPROT 27.9* 25.2*  INR 2.6* 2.3*   Hepatitis Panel No results for input(s): HEPBSAG, HCVAB, HEPAIGM, HEPBIGM in the last 72 hours.  Studies/Results: CT Head Wo Contrast  Result Date: 12/24/2019 CLINICAL DATA:  39 year old female with history of head trauma. Altered mental status. EXAM: CT HEAD WITHOUT CONTRAST TECHNIQUE: Contiguous axial images were obtained from the base of the skull through the vertex without intravenous contrast. COMPARISON:  Head CT 11/21/2018. FINDINGS: Brain: No evidence of acute infarction, hemorrhage, hydrocephalus, extra-axial collection or mass lesion/mass effect. Vascular: No hyperdense vessel or unexpected calcification. Skull: Normal. Negative for fracture or focal lesion. Sinuses/Orbits: No acute finding. Other: None. IMPRESSION: 1. No acute intracranial abnormalities. The appearance of the brain is normal. Electronically Signed   By: Vinnie Langton M.D.   On: 12/24/2019 06:33   US Abdomen Limited RUQ  Result Date: 12/23/2019 CLINICAL DATA:  Elevated liver function tests EXAM: ULTRASOUND ABDOMEN LIMITED RIGHT UPPER QUADRANT COMPARISON:  07/06/2019, 08/29/2018 FINDINGS: Gallbladder: Surgically absent Common bile duct: Diameter: 6 mm Liver: Heterogeneous increased liver echotexture consistent with hepatic steatosis. Slight nodularity of the liver capsule may reflect underlying cirrhosis. No focal liver  abnormalities. Portal vein not well visualized due to patient cooperation. Other: None. IMPRESSION: 1. Cholecystectomy. 2. Hepatic steatosis, with subtle nodularity of the liver capsule which may reflect developing cirrhosis. Electronically Signed   By: Randa Ngo M.D.   On: 12/23/2019 23:29   Scheduled Meds: . chlordiazePOXIDE  15 mg Oral TID  . lactulose  30 g Oral Q2H  . multivitamin with minerals  1 tablet Oral Daily  . sodium  chloride flush  3 mL Intravenous Q12H  . thiamine injection  100 mg Intravenous Daily   Continuous Infusions: . cefTRIAXone (ROCEPHIN)  IV 2 g (12/25/19 1050)  . octreotide  (SANDOSTATIN)    IV infusion 50 mcg/hr (12/25/19 0747)  . pantoprozole (PROTONIX) infusion 8 mg/hr (12/25/19 0232)  . banana bag IV 1000 mL 100 mL/hr at 12/25/19 1132   PRN Meds:.LORazepam **OR** LORazepam, ondansetron **OR** ondansetron (ZOFRAN) IV  ASSESMENT:   *   Recurrent alcoholic hepatitis.  Suspect cirrhosis.  No ascites on ultrasound. Meld 30.  Discriminant function 85.  No prednisolone until infections ruled out.   Octreotide and Protonix drip, Rocephin in place.    *     Hepatic encephalopathy.  Lactulose enemas in place  *   Leukocytosis, persistent.   *    Coagulopathy  *    Thrombocytopenia @ 85.   *     Nosebleed, streaky emesis following vigorous coughing.  FOBT positive.  *      Macrocytic anemia.  Hgb 7.1.  No transfusions thus far.    *    Alcoholism, suspected ETOH withdrawal (though husband observed no ETOH consumption since binge 10 d ago). Banana bag, prn Ativan in place.      PLAN   *   Full liquid diet  *   Anemia panel along with the CBC at 2 PM today.  *   Lactulose 30 g q 2 hours until she has had 3-4 bowel movements then can reduce dose to every 4 or every 6 hours.  *    Ordered UA and chest x-ray to rule out urinary tract, lung infection and if these are negative may be able to initiate prednisolone.    Joann [name]  12/25/2019, 11:52 AM Phone [phone]

## 2019-12-25 NOTE — Progress Notes (Signed)
At the request of MD, CSW faxed signed Change of Commitment paper to [facility]. Patient is no longer IVC'd.  Shephanie Romas LCSW

## 2019-12-25 NOTE — Progress Notes (Signed)
PROGRESS NOTE        PATIENT DETAILS Name: Joann [name] Age: 39 y.o. Sex: female Date of Birth: 1981/07/20 Admit Date: 12/23/2019 Admitting Physician Karmen Bongo, MD VAN:[email or web address], Judson Roch, PA-C  Brief Narrative: Patient is a 39 y.o. female history of alcoholic hepatitis, probable liver cirrhosis-longstanding history of alcohol abuse-presenting with confusion-found to have acute hepatic encephalopathy, possible acute blood loss anemia related to upper GI bleeding.  Subsequently admitted to the hospitalist service-see below for further details.  Significant events: 4/14>> presented to the ED with confusion-found to have hepatic encephalopathy  Antimicrobial therapy: Rocephin: 4/14>>  Microbiology data: None  Procedures : None  Consults: GI  DVT Prophylaxis : SCD's  Subjective: Seems to be much more awake and alert compared to yesterday.  Answers simple questions appropriately.  Tremulous.  Assessment/Plan: Hepatic encephalopathy: Improved-still somewhat encephalopathic-not at baseline-continue lactulose, add rifaximin.  Alcoholic hepatitis: Continue supportive care-we will discuss with GI regarding addition of steroids.  Suspect leukocytosis is from inflammation related to alcohol hepatitis-there is really no other foci of infection that is apparent at this point.  Suspected upper GI bleeding and acute blood loss anemia: Bloody stools noted on rectal exam in the emergency room-but no overt bleeding.  Patient does acknowledge (this morning) that she had hematemesis at least once a few days back.  Continue PPI/octreotide-GI following-we will defer timing of endoscopy to gastroenterology.  Repeat CBC later this afternoon-if continues to drop-suspect will require PRBC transfusion.  Continue Rocephin for SBP prophylaxis.  Coagulopathy/thrombocytopenia: Likely secondary to alcoholic hepatitis/liver cirrhosis.  Supportive care.  Alcohol withdrawal:  Tremulous but relatively awake and alert-Per patient (confirmed with spouse) her last drink was approximately 7-10 days back.  Continue Ativan per CIWA protocol.  Some of the tremors on admission may have been from hepatic encephalopathy.  Will need extensive counseling when she is more awake and alert.  Diet: Diet Order            Diet full liquid Room service appropriate? Yes; Fluid consistency: Thin  Diet effective now              Code Status: Full code  Family Communication: Spouse over the phone on 4/16  Disposition Plan: Remain inpatient-not stable for discharge-we will probably require home health services when ready  Barriers to Discharge: Hepatic encephalopathy, severe blood loss anemia-suspected upper GI bleeding-ongoing alcoholic hepatitis.   Antimicrobial agents: Anti-infectives (From admission, onward)   Start     Dose/Rate Route Frequency Ordered Stop   12/24/19 0906  cefTRIAXone (ROCEPHIN) 2 g in sodium chloride 0.9 % 100 mL IVPB     2 g 200 mL/hr over 30 Minutes Intravenous Every 24 hours 12/24/19 0902     12/24/19 0415  cefTRIAXone (ROCEPHIN) 2 g in sodium chloride 0.9 % 100 mL IVPB     2 g 200 mL/hr over 30 Minutes Intravenous  Once 12/24/19 0408 12/24/19 0706       Time spent: 35 minutes-Greater than 50% of this time was spent in counseling, explanation of diagnosis, planning of further management, and coordination of care.  MEDICATIONS: Scheduled Meds: . lactulose  30 g Oral Q2H  . sodium chloride flush  3 mL Intravenous Q12H   Continuous Infusions: . 0.9 % NaCl with KCl 40 mEq / L 100 mL/hr (12/25/19 0738)  . cefTRIAXone (ROCEPHIN)  IV 2 g (12/25/19 1050)  .  octreotide  (SANDOSTATIN)    IV infusion 50 mcg/hr (12/25/19 0747)  . pantoprozole (PROTONIX) infusion 8 mg/hr (12/25/19 0232)  . banana bag IV 1000 mL 100 mL/hr at 12/25/19 1132   PRN Meds:.LORazepam **OR** LORazepam, ondansetron **OR** ondansetron (ZOFRAN) IV   PHYSICAL EXAM: Vital  signs: Vitals:   12/25/19 0400 12/25/19 0402 12/25/19 0600 12/25/19 0830  BP: 101/62   (!) 94/59  Pulse: 91   (!) 102  Resp: 20   19  Temp:  98 F (36.7 C) 98 F (36.7 C) 98.1 F (36.7 C)  TempSrc:  Oral Oral Oral  SpO2: 100%      There were no vitals filed for this visit. There is no height or weight on file to calculate BMI.   Gen Exam:Alert awake-still somewhat lethargic but seems to be better compared to yesterday-not in any distress HEENT:atraumatic, normocephalic Chest: B/L clear to auscultation anteriorly CVS:S1S2 regular Abdomen:soft non tender, non distended Extremities:no edema Neurology: Non focal Skin: no rash  I have personally reviewed following labs and imaging studies  LABORATORY DATA: CBC: Recent Labs  Lab 12/23/19 2057 12/24/19 0919 12/25/19 0415  WBC 17.6* 17.4* 17.8*  HGB 8.2* 7.7* 7.1*  HCT 26.4* 24.3* 22.8*  MCV 118.4* 116.3* 117.5*  PLT 120* 100* 85*    Basic Metabolic Panel: Recent Labs  Lab 12/23/19 2057 12/24/19 0130 12/24/19 0919 12/25/19 0415  NA 128* 130* 135 132*  K 2.3* 2.3* 2.9* 3.7  CL 89* 89* 96* 104  CO2 22 17* 23 20*  GLUCOSE 107* 126* 105* 92  BUN <5* <5* <5* <5*  CREATININE 1.07* 1.10* 0.84 0.75  CALCIUM 8.1* 8.5* 8.3* 7.5*  MG  --  1.8  --   --   PHOS  --  3.7 3.3  --     GFR: CrCl cannot be calculated (Unknown ideal weight.).  Liver Function Tests: Recent Labs  Lab 12/23/19 2057 12/24/19 0130 12/24/19 0919 12/25/19 0415  AST 143* 164* 166* 152*  ALT 26 32 33 26  ALKPHOS 197* 207* 183* 163*  BILITOT 26.6* 28.9* 24.3* 21.8*  PROT 8.7* 9.7* 8.2* 7.3  ALBUMIN 1.5* 1.6* 1.5* 1.3*   Recent Labs  Lab 12/24/19 0130  LIPASE 18   Recent Labs  Lab 12/24/19 0312  AMMONIA 137*    Coagulation Profile: Recent Labs  Lab 12/24/19 0130 12/24/19 0919 12/25/19 0415  INR 2.4* 2.6* 2.3*    Cardiac Enzymes: No results for input(s): CKTOTAL, CKMB, CKMBINDEX, TROPONINI in the last 168 hours.  BNP (last  3 results) No results for input(s): PROBNP in the last 8760 hours.  Lipid Profile: No results for input(s): CHOL, HDL, LDLCALC, TRIG, CHOLHDL, LDLDIRECT in the last 72 hours.  Thyroid Function Tests: No results for input(s): TSH, T4TOTAL, FREET4, T3FREE, THYROIDAB in the last 72 hours.  Anemia Panel: No results for input(s): VITAMINB12, FOLATE, FERRITIN, TIBC, IRON, RETICCTPCT in the last 72 hours.  Urine analysis:    Component Value Date/Time   COLORURINE AMBER (A) 04/02/2019 0835   APPEARANCEUR HAZY (A) 04/02/2019 0835   LABSPEC 1.009 04/02/2019 0835   PHURINE 7.0 04/02/2019 0835   GLUCOSEU NEGATIVE 04/02/2019 0835   HGBUR MODERATE (A) 04/02/2019 0835   BILIRUBINUR SMALL (A) 04/02/2019 0835   KETONESUR 5 (A) 04/02/2019 0835   PROTEINUR NEGATIVE 04/02/2019 0835   NITRITE NEGATIVE 04/02/2019 0835   LEUKOCYTESUR NEGATIVE 04/02/2019 0835    Sepsis Labs: Lactic Acid, Venous    Component Value Date/Time   LATICACIDVEN 2.0 (HH)  11/21/2018 2319    MICROBIOLOGY: Recent Results (from the past 240 hour(s))  Respiratory Panel by RT PCR (Flu A&B, Covid) - Nasopharyngeal Swab     Status: None   Collection Time: 12/24/19  3:04 AM   Specimen: Nasopharyngeal Swab  Result Value Ref Range Status   SARS Coronavirus 2 by RT PCR NEGATIVE NEGATIVE Final    Comment: (NOTE) SARS-CoV-2 target nucleic acids are NOT DETECTED. The SARS-CoV-2 RNA is generally detectable in upper respiratoy specimens during the acute phase of infection. The lowest concentration of SARS-CoV-2 viral copies this assay can detect is 131 copies/mL. A negative result does not preclude SARS-Cov-2 infection and should not be used as the sole basis for treatment or other patient management decisions. A negative result may occur with  improper specimen collection/handling, submission of specimen other than nasopharyngeal swab, presence of viral mutation(s) within the areas targeted by this assay, and inadequate number  of viral copies (<131 copies/mL). A negative result must be combined with clinical observations, patient history, and epidemiological information. The expected result is Negative. Fact Sheet for Patients:  PinkCheek.be Fact Sheet for Healthcare Providers:  GravelBags.it This test is not yet ap proved or cleared by the [place] FDA and  has been authorized for detection and/or diagnosis of SARS-CoV-2 by FDA under an Emergency Use Authorization (EUA). This EUA will remain  in effect (meaning this test can be used) for the duration of the COVID-19 declaration under Section 564(b)(1) of the Act, 21 U.S.C. section 360bbb-3(b)(1), unless the authorization is terminated or revoked sooner.    Influenza A by PCR NEGATIVE NEGATIVE Final   Influenza B by PCR NEGATIVE NEGATIVE Final    Comment: (NOTE) The Xpert Xpress SARS-CoV-2/FLU/RSV assay is intended as an aid in  the diagnosis of influenza from Nasopharyngeal swab specimens and  should not be used as a sole basis for treatment. Nasal washings and  aspirates are unacceptable for Xpert Xpress SARS-CoV-2/FLU/RSV  testing. Fact Sheet for Patients: PinkCheek.be Fact Sheet for Healthcare Providers: GravelBags.it This test is not yet approved or cleared by the [place] FDA and  has been authorized for detection and/or diagnosis of SARS-CoV-2 by  FDA under an Emergency Use Authorization (EUA). This EUA will remain  in effect (meaning this test can be used) for the duration of the  Covid-19 declaration under Section 564(b)(1) of the Act, 21  U.S.C. section 360bbb-3(b)(1), unless the authorization is  terminated or revoked. Performed at [facility], [place] [address] [place], [place] [address]   SARS Coronavirus 2 by RT PCR     Status: None   Collection Time: 12/24/19  3:04 AM  Result Value Ref Range Status   SARS  Coronavirus 2 NEGATIVE NEGATIVE Final    Comment: (NOTE) Result indicates the ABSENCE of SARS-CoV-2 RNA in the patient specimen.  The lowest concentration of SARS-CoV-2 viral copies this assay can detect in nasopharyngeal swab specimens is 500 copies / mL.  A negative result does not preclude SARS-CoV-2 infection and should not be used as the sole basis for patient management decisions. A negative result may occur with improper specimen collection / handling, submission of a specimen other than nasopharyngeal swab, presence of viral mutation(s) within the areas targeted by this assay, and inadequate number of viral copies (<500 copies / mL) present.  Negative results must be combined with clinical observations, patient history, and epidemiological information.  The expected result is NEGATIVE.  Patient Fact Sheet:  BlogSelections.co.uk   Provider Fact  Sheet:  https://lucas.com/   This test is not yet approved or cleared by the [place] and  has been authorized for  detection and/or diagnosis of SARS-CoV-2 by FDA under an Emergency Use Authorization (EUA).  This EUA will remain in effect (meaning this test can be used) for the duration of  the COVID-19 declaration under Section 564(b)(1) of the Act, 21 U.S.C. section 360bbb-3(b)(1), unless the authorization is terminated or revoked sooner Performed at [facility], [place] [address] [place], [place] [address]     RADIOLOGY STUDIES/RESULTS: CT Head Wo Contrast  Result Date: 12/24/2019 CLINICAL DATA:  39 year old female with history of head trauma. Altered mental status. EXAM: CT HEAD WITHOUT CONTRAST TECHNIQUE: Contiguous axial images were obtained from the base of the skull through the vertex without intravenous contrast. COMPARISON:  Head CT 11/21/2018. FINDINGS: Brain: No evidence of acute infarction, hemorrhage, hydrocephalus, extra-axial collection or mass lesion/mass  effect. Vascular: No hyperdense vessel or unexpected calcification. Skull: Normal. Negative for fracture or focal lesion. Sinuses/Orbits: No acute finding. Other: None. IMPRESSION: 1. No acute intracranial abnormalities. The appearance of the brain is normal. Electronically Signed   By: Vinnie Langton M.D.   On: 12/24/2019 06:33   US Abdomen Limited RUQ  Result Date: 12/23/2019 CLINICAL DATA:  Elevated liver function tests EXAM: ULTRASOUND ABDOMEN LIMITED RIGHT UPPER QUADRANT COMPARISON:  07/06/2019, 08/29/2018 FINDINGS: Gallbladder: Surgically absent Common bile duct: Diameter: 6 mm Liver: Heterogeneous increased liver echotexture consistent with hepatic steatosis. Slight nodularity of the liver capsule may reflect underlying cirrhosis. No focal liver abnormalities. Portal vein not well visualized due to patient cooperation. Other: None. IMPRESSION: 1. Cholecystectomy. 2. Hepatic steatosis, with subtle nodularity of the liver capsule which may reflect developing cirrhosis. Electronically Signed   By: Randa Ngo M.D.   On: 12/23/2019 23:29     LOS: 1 day   Oren Binet, MD  [facility]    To contact the attending provider between 7A-7P or the covering provider during after hours 7P-7A, please log into the web site [email or web address] and access using universal [place] password for that web site. If you do not have the password, please call the hospital operator.  12/25/2019, 11:39 AM

## 2019-12-26 ENCOUNTER — Inpatient Hospital Stay (HOSPITAL_COMMUNITY): Payer: Medicaid Other

## 2019-12-26 DIAGNOSIS — F10239 Alcohol dependence with withdrawal, unspecified: Secondary | ICD-10-CM | POA: Diagnosis not present

## 2019-12-26 DIAGNOSIS — K7290 Hepatic failure, unspecified without coma: Secondary | ICD-10-CM | POA: Diagnosis not present

## 2019-12-26 DIAGNOSIS — D696 Thrombocytopenia, unspecified: Secondary | ICD-10-CM | POA: Diagnosis not present

## 2019-12-26 DIAGNOSIS — K7010 Alcoholic hepatitis without ascites: Secondary | ICD-10-CM | POA: Diagnosis not present

## 2019-12-26 LAB — CBC
HCT: 19.4 % — ABNORMAL LOW (ref 36.0–46.0)
Hemoglobin: 6 g/dL — CL (ref 12.0–15.0)
MCH: 37.5 pg — ABNORMAL HIGH (ref 26.0–34.0)
MCHC: 30.9 g/dL (ref 30.0–36.0)
MCV: 121.3 fL — ABNORMAL HIGH (ref 80.0–100.0)
Platelets: 64 10*3/uL — ABNORMAL LOW (ref 150–400)
RBC: 1.6 MIL/uL — ABNORMAL LOW (ref 3.87–5.11)
RDW: 20.6 % — ABNORMAL HIGH (ref 11.5–15.5)
WBC: 21.6 10*3/uL — ABNORMAL HIGH (ref 4.0–10.5)
nRBC: 1.1 % — ABNORMAL HIGH (ref 0.0–0.2)

## 2019-12-26 LAB — COMPREHENSIVE METABOLIC PANEL
ALT: 26 U/L (ref 0–44)
AST: 138 U/L — ABNORMAL HIGH (ref 15–41)
Albumin: 1.1 g/dL — ABNORMAL LOW (ref 3.5–5.0)
Alkaline Phosphatase: 141 U/L — ABNORMAL HIGH (ref 38–126)
Anion gap: 9 (ref 5–15)
BUN: <5 mg/dL — ABNORMAL LOW (ref 6–20)
CO2: 17 mmol/L — ABNORMAL LOW (ref 22–32)
Calcium: 7.5 mg/dL — ABNORMAL LOW (ref 8.9–10.3)
Chloride: 104 mmol/L (ref 98–111)
Creatinine, Ser: 1.48 mg/dL — ABNORMAL HIGH (ref 0.44–1.00)
GFR calc Af Amer: 51 mL/min — ABNORMAL LOW (ref >60)
GFR calc non Af Amer: 44 mL/min — ABNORMAL LOW (ref >60)
Glucose, Bld: 106 mg/dL — ABNORMAL HIGH (ref 70–99)
Potassium: 4.1 mmol/L (ref 3.5–5.1)
Sodium: 130 mmol/L — ABNORMAL LOW (ref 135–145)
Total Bilirubin: 20 mg/dL (ref 0.3–1.2)
Total Protein: 6.6 g/dL (ref 6.5–8.1)

## 2019-12-26 LAB — URINALYSIS, ROUTINE W REFLEX MICROSCOPIC
Cellular Cast, UA: 9
Glucose, UA: NEGATIVE mg/dL
Hgb urine dipstick: NEGATIVE
Ketones, ur: 5 mg/dL — AB
Leukocytes,Ua: NEGATIVE
Nitrite: NEGATIVE
Protein, ur: 30 mg/dL — AB
Specific Gravity, Urine: 1.019 (ref 1.005–1.030)
pH: 5 (ref 5.0–8.0)

## 2019-12-26 LAB — MAGNESIUM: Magnesium: 2 mg/dL (ref 1.7–2.4)

## 2019-12-26 LAB — AMMONIA: Ammonia: 92 umol/L — ABNORMAL HIGH (ref 9–35)

## 2019-12-26 LAB — PREPARE RBC (CROSSMATCH)

## 2019-12-26 MED ORDER — MELATONIN 3 MG PO TABS
3.0000 mg | ORAL_TABLET | Freq: Every day | ORAL | Status: DC
  Administered 2019-12-26 – 2019-12-27 (×2): 3 mg via ORAL
  Filled 2019-12-26 (×3): qty 1

## 2019-12-26 MED ORDER — LACTULOSE 10 GM/15ML PO SOLN
30.0000 g | Freq: Four times a day (QID) | ORAL | Status: DC
  Administered 2019-12-26 – 2019-12-29 (×9): 30 g via ORAL
  Filled 2019-12-26 (×11): qty 45

## 2019-12-26 MED ORDER — LACTULOSE 10 GM/15ML PO SOLN: 30.0000 g | Freq: Four times a day (QID) | ORAL | Status: DC

## 2019-12-26 MED ORDER — ACETAMINOPHEN 325 MG PO TABS
650.0000 mg | ORAL_TABLET | Freq: Once | ORAL | Status: AC
  Administered 2019-12-26: 650 mg via ORAL
  Filled 2019-12-26: qty 2

## 2019-12-26 MED ORDER — PREDNISOLONE 5 MG PO TABS
40.0000 mg | ORAL_TABLET | Freq: Every day | ORAL | Status: DC
  Administered 2019-12-26 – 2019-12-30 (×5): 40 mg via ORAL
  Filled 2019-12-26 (×6): qty 8

## 2019-12-26 MED ORDER — DIPHENHYDRAMINE HCL 50 MG/ML IJ SOLN
25.0000 mg | Freq: Once | INTRAMUSCULAR | Status: AC
  Administered 2019-12-26: 25 mg via INTRAVENOUS
  Filled 2019-12-26: qty 1

## 2019-12-26 MED ORDER — LACTULOSE ENEMA
300.0000 mL | ORAL | Status: DC
  Administered 2019-12-26: 300 mL via RECTAL
  Filled 2019-12-26 (×2): qty 300

## 2019-12-26 MED ORDER — SODIUM CHLORIDE 0.9% FLUSH
10.0000 mL | INTRAVENOUS | Status: DC | PRN
  Administered 2019-12-29: 10 mL

## 2019-12-26 MED ORDER — FUROSEMIDE 10 MG/ML IJ SOLN: 20.0000 mg | Freq: Once | INTRAMUSCULAR | Status: DC

## 2019-12-26 MED ORDER — MIDODRINE HCL 5 MG PO TABS
5.0000 mg | ORAL_TABLET | Freq: Three times a day (TID) | ORAL | Status: DC
  Administered 2019-12-26 – 2019-12-31 (×12): 5 mg via ORAL
  Filled 2019-12-26 (×11): qty 1

## 2019-12-26 MED ORDER — FUROSEMIDE 10 MG/ML IJ SOLN
INTRAMUSCULAR | Status: AC
  Administered 2019-12-26: 20 mg via INTRAVENOUS
  Filled 2019-12-26: qty 2

## 2019-12-26 MED ORDER — SODIUM CHLORIDE 0.9% IV SOLUTION: Freq: Once | INTRAVENOUS | Status: AC

## 2019-12-26 NOTE — Progress Notes (Signed)
Dr. Sloan Leiter notified of critical result, HGB of 6.0

## 2019-12-26 NOTE — Progress Notes (Addendum)
PROGRESS NOTE        PATIENT DETAILS Name: Joann [name] Age: 39 y.o. Sex: female Date of Birth: 1981/08/02 Admit Date: 12/23/2019 Admitting Physician Joann Bongo, MD JIR:[id number], Joann Roch, PA-C  Brief Narrative: Patient is a 39 y.o. female history of alcoholic hepatitis, probable liver cirrhosis-longstanding history of alcohol abuse-presenting with confusion-found to have acute hepatic encephalopathy, possible acute blood loss anemia related to upper GI bleeding.  Subsequently admitted to the hospitalist service-see below for further details.  Significant events: 4/14>> presented to the ED with confusion-found to have hepatic encephalopathy  Antimicrobial therapy: Rocephin: 4/14>>  Microbiology data: None  Procedures : None  Consults: GI  DVT Prophylaxis : SCD's  Subjective: Somnolent this morning-hard to arouse-given lactulose enema x2-had numerous BMs-this afternoon she is much more awake-sitting in the chair at bedside-eating lunch.    Per patient-she is currently on menstrual period and it has been ongoing for the past 2 weeks.  Per patient-she has had similar issues with her menstrual cycle for the past 2-3 months-and has made an appointment with a GYN in [place].  Assessment/Plan: Hepatic encephalopathy: Overall improved-she was somnolent this morning-given lactulose enema x2 with significant improvement-she is sitting at bedside chair-asking for popsicles this afternoon.  Ammonia levels downtrending-change lactulose to 30 grams every 6 hours.  In terms of encephalopathy-she still is somewhat lethargic/slow-but overall much better.  May need to add rifaximin if encephalopathy persists.   Alcoholic hepatitis: Overtly jaundiced-LFTs/bilirubin slowly downtrending.  Discussed with GI MD-Dr. Beulah [name] will go ahead and start steroids.  UA really not helpful-she does not have any clinical signs of UTI.  Chest x-ray without any infection.  Will check  blood cultures.  Suspected upper GI bleeding and acute blood loss anemia: Bloody stools noted on rectal exam in the emergency room-but no overt bleeding.  Seems to be a poor/unreliable historian-did acknowledge that she may have had hematemesis/hemoptysis a few days prior to this hospitalization-hemoglobin has decreased to 6 today-we will go ahead and transfuse 2 units of PRBC.  Remains on octreotide and PPI infusion.  On empiric Rocephin for SBP prophylaxis.  GI planning on endoscopic evaluation tomorrow.  Coagulopathy/thrombocytopenia: Likely secondary to alcoholic hepatitis/liver cirrhosis.  Supportive care.  Alcohol withdrawal: Significantly less tremulous-Per patient (confirmed with spouse) her last drink was approximately 7-10 days prior to hospitalization.  She is relatively awake and alert-clinical picture is clouded by hepatic encephalopathy-but do not think she has significant/serious withdrawal symptoms at this time.  Continue Ativan per CIWA protocol-remains on MVI/thiamine.  When she is a bit more awake and alert-we will need counseling regarding importance of complete abstinence from alcohol.    Menorrhagia: Per patient she has been on her menstrual period for approximately 2 weeks now-apparently this has occurred in the past as well-and she has made an appointment with a GYN MD at [place]-this history was confirmed with the patient's spouse over the phone.  Pregnancy test on admission was negative.  We will go ahead and obtain a pelvic/transvaginal ultrasound.  If no significant abnormality seen-further work-up could be done in the outpatient setting.  AKI : Bump in creatinine today-blood pressure appears to be soft-getting 2 units of PRBC-avoid nephrotoxic agents-volume status is stable-recheck electrolytes tomorrow.  If renal function worsens-may need to consider further work-up.  Diet: Diet Order  Diet regular Room service appropriate? Yes; Fluid consistency: Thin  Diet  effective now              Code Status: Full code  Family Communication: Spouse over the phone on 4/17  Disposition Plan: Remain inpatient-not stable for discharge-we will probably require home health services when ready  Barriers to Discharge: Hepatic encephalopathy, severe blood loss anemia-suspected upper GI bleeding-ongoing alcoholic hepatitis.   Antimicrobial agents: Anti-infectives (From admission, onward)   Start     Dose/Rate Route Frequency Ordered Stop   12/24/19 0906  cefTRIAXone (ROCEPHIN) 2 g in sodium chloride 0.9 % 100 mL IVPB     2 g 200 mL/hr over 30 Minutes Intravenous Every 24 hours 12/24/19 0902     12/24/19 0415  cefTRIAXone (ROCEPHIN) 2 g in sodium chloride 0.9 % 100 mL IVPB     2 g 200 mL/hr over 30 Minutes Intravenous  Once 12/24/19 0408 12/24/19 0706       Time spent: 35 minutes-Greater than 50% of this time was spent in counseling, explanation of diagnosis, planning of further management, and coordination of care.  MEDICATIONS: Scheduled Meds: . lactulose  30 g Oral Q6H  . lactulose  300 mL Rectal Q4H  . multivitamin with minerals  1 tablet Oral Daily  . sodium chloride flush  3 mL Intravenous Q12H  . thiamine injection  100 mg Intravenous Daily   Continuous Infusions: . cefTRIAXone (ROCEPHIN)  IV 2 g (12/26/19 0914)  . octreotide  (SANDOSTATIN)    IV infusion 50 mcg/hr (12/26/19 1357)  . pantoprozole (PROTONIX) infusion 8 mg/hr (12/26/19 1358)  . banana bag IV 1000 mL 100 mL/hr at 12/26/19 1100   PRN Meds:.LORazepam **OR** LORazepam, ondansetron **OR** ondansetron (ZOFRAN) IV   PHYSICAL EXAM: Vital signs: Vitals:   12/26/19 1345 12/26/19 1400 12/26/19 1415 12/26/19 1430  BP: (!) 109/57 (!) 98/59 104/76 105/68  Pulse: (!) 103 (!) 104 (!) 101 (!) 102  Resp: (!) 26 (!) 26 (!) 38 (!) 30  Temp: (!) 97.4 F (36.3 C) 97.6 F (36.4 C) 97.8 F (36.6 C) 97.6 F (36.4 C)  TempSrc: Oral Oral  Oral  SpO2: 95% 96% 97% 96%  Weight:        Filed Weights   12/26/19 0357  Weight: 62.8 kg   Body mass index is 23.76 kg/m.   Gen Exam: Somnolent this morning-much more awake and alert this afternoon.  Grossly icteric sclera. HEENT:atraumatic, normocephalic Chest: B/L clear to auscultation anteriorly CVS:S1S2 regular Abdomen:soft non tender, non distended Extremities: Trace edema Neurology: Non focal Skin: no rash  I have personally reviewed following labs and imaging studies  LABORATORY DATA: CBC: Recent Labs  Lab 12/23/19 2057 12/24/19 0919 12/25/19 0415 12/25/19 1405 12/26/19 1044  WBC 17.6* 17.4* 17.8* 20.2* 21.6*  HGB 8.2* 7.7* 7.1* 7.8* 6.0*  HCT 26.4* 24.3* 22.8* 25.4* 19.4*  MCV 118.4* 116.3* 117.5* 121.5* 121.3*  PLT 120* 100* 85* 91* 64*    Basic Metabolic Panel: Recent Labs  Lab 12/23/19 2057 12/24/19 0130 12/24/19 0919 12/25/19 0415 12/26/19 1044  NA 128* 130* 135 132* 130*  K 2.3* 2.3* 2.9* 3.7 4.1  CL 89* 89* 96* 104 104  CO2 22 17* 23 20* 17*  GLUCOSE 107* 126* 105* 92 106*  BUN <5* <5* <5* <5* <5*  CREATININE 1.07* 1.10* 0.84 0.75 1.48*  CALCIUM 8.1* 8.5* 8.3* 7.5* 7.5*  MG  --  1.8  --   --  2.0  PHOS  --  3.7 3.3  --   --     GFR: Estimated Creatinine Clearance: 43.6 mL/min (A) (by C-G formula based on SCr of 1.48 mg/dL (H)).  Liver Function Tests: Recent Labs  Lab 12/23/19 2057 12/24/19 0130 12/24/19 0919 12/25/19 0415 12/26/19 1044  AST 143* 164* 166* 152* 138*  ALT 26 32 33 26 26  ALKPHOS 197* 207* 183* 163* 141*  BILITOT 26.6* 28.9* 24.3* 21.8* 20.0*  PROT 8.7* 9.7* 8.2* 7.3 6.6  ALBUMIN 1.5* 1.6* 1.5* 1.3* 1.1*   Recent Labs  Lab 12/24/19 0130  LIPASE 18   Recent Labs  Lab 12/24/19 0312 12/26/19 1044  AMMONIA 137* 92*    Coagulation Profile: Recent Labs  Lab 12/24/19 0130 12/24/19 0919 12/25/19 0415  INR 2.4* 2.6* 2.3*    Cardiac Enzymes: No results for input(s): CKTOTAL, CKMB, CKMBINDEX, TROPONINI in the last 168 hours.  BNP (last 3  results) No results for input(s): PROBNP in the last 8760 hours.  Lipid Profile: No results for input(s): CHOL, HDL, LDLCALC, TRIG, CHOLHDL, LDLDIRECT in the last 72 hours.  Thyroid Function Tests: No results for input(s): TSH, T4TOTAL, FREET4, T3FREE, THYROIDAB in the last 72 hours.  Anemia Panel: Recent Labs    12/25/19 1405  VITAMINB12 4,128*  FOLATE 21.3  FERRITIN 234  TIBC 133*  IRON 74  RETICCTPCT 6.2*    Urine analysis:    Component Value Date/Time   COLORURINE AMBER (A) 12/26/2019 0920   APPEARANCEUR CLOUDY (A) 12/26/2019 0920   LABSPEC 1.019 12/26/2019 0920   PHURINE 5.0 12/26/2019 0920   GLUCOSEU NEGATIVE 12/26/2019 0920   HGBUR NEGATIVE 12/26/2019 0920   BILIRUBINUR MODERATE (A) 12/26/2019 0920   KETONESUR 5 (A) 12/26/2019 0920   PROTEINUR 30 (A) 12/26/2019 0920   NITRITE NEGATIVE 12/26/2019 0920   LEUKOCYTESUR NEGATIVE 12/26/2019 0920    Sepsis Labs: Lactic Acid, Venous    Component Value Date/Time   LATICACIDVEN 2.0 (HH) 11/21/2018 2319    MICROBIOLOGY: Recent Results (from the past 240 hour(s))  Respiratory Panel by RT PCR (Flu A&B, Covid) - Nasopharyngeal Swab     Status: None   Collection Time: 12/24/19  3:04 AM   Specimen: Nasopharyngeal Swab  Result Value Ref Range Status   SARS Coronavirus 2 by RT PCR NEGATIVE NEGATIVE Final    Comment: (NOTE) SARS-CoV-2 target nucleic acids are NOT DETECTED. The SARS-CoV-2 RNA is generally detectable in upper respiratoy specimens during the acute phase of infection. The lowest concentration of SARS-CoV-2 viral copies this assay can detect is 131 copies/mL. A negative result does not preclude SARS-Cov-2 infection and should not be used as the sole basis for treatment or other patient management decisions. A negative result may occur with  improper specimen collection/handling, submission of specimen other than nasopharyngeal swab, presence of viral mutation(s) within the areas targeted by this assay, and  inadequate number of viral copies (<131 copies/mL). A negative result must be combined with clinical observations, patient history, and epidemiological information. The expected result is Negative. Fact Sheet for Patients:  PinkCheek.be Fact Sheet for Healthcare Providers:  GravelBags.it This test is not yet ap proved or cleared by the [place] FDA and  has been authorized for detection and/or diagnosis of SARS-CoV-2 by FDA under an Emergency Use Authorization (EUA). This EUA will remain  in effect (meaning this test can be used) for the duration of the COVID-19 declaration under Section 564(b)(1) of the Act, 21 U.S.C. section 360bbb-3(b)(1), unless the authorization is terminated or revoked sooner.  Influenza A by PCR NEGATIVE NEGATIVE Final   Influenza B by PCR NEGATIVE NEGATIVE Final    Comment: (NOTE) The Xpert Xpress SARS-CoV-2/FLU/RSV assay is intended as an aid in  the diagnosis of influenza from Nasopharyngeal swab specimens and  should not be used as a sole basis for treatment. Nasal washings and  aspirates are unacceptable for Xpert Xpress SARS-CoV-2/FLU/RSV  testing. Fact Sheet for Patients: PinkCheek.be Fact Sheet for Healthcare Providers: GravelBags.it This test is not yet approved or cleared by the [place] FDA and  has been authorized for detection and/or diagnosis of SARS-CoV-2 by  FDA under an Emergency Use Authorization (EUA). This EUA will remain  in effect (meaning this test can be used) for the duration of the  Covid-19 declaration under Section 564(b)(1) of the Act, 21  U.S.C. section 360bbb-3(b)(1), unless the authorization is  terminated or revoked. Performed at [facility], [place] [address], [place], [place] [address]   SARS Coronavirus 2 by RT PCR     Status: None   Collection Time: 12/24/19  3:04 AM  Result Value Ref  Range Status   SARS Coronavirus 2 NEGATIVE NEGATIVE Final    Comment: (NOTE) Result indicates the ABSENCE of SARS-CoV-2 RNA in the patient specimen.  The lowest concentration of SARS-CoV-2 viral copies this assay can detect in nasopharyngeal swab specimens is 500 copies / mL.  A negative result does not preclude SARS-CoV-2 infection and should not be used as the sole basis for patient management decisions. A negative result may occur with improper specimen collection / handling, submission of a specimen other than nasopharyngeal swab, presence of viral mutation(s) within the areas targeted by this assay, and inadequate number of viral copies (<500 copies / mL) present.  Negative results must be combined with clinical observations, patient history, and epidemiological information.  The expected result is NEGATIVE.  Patient Fact Sheet:  BlogSelections.co.uk   Provider Fact Sheet:  https://lucas.com/   This test is not yet approved or cleared by the [place] FDA and  has been authorized for  detection and/or diagnosis of SARS-CoV-2 by FDA under an Emergency Use Authorization (EUA).  This EUA will remain in effect (meaning this test can be used) for the duration of  the COVID-19 declaration under Section 564(b)(1) of the Act, 21 U.S.C. section 360bbb-3(b)(1), unless the authorization is terminated or revoked sooner Performed at [facility], [place] [address], [place], [place] [address]     RADIOLOGY STUDIES/RESULTS: DG Chest 1 View  Result Date: 12/25/2019 CLINICAL DATA:  Leukocytosis EXAM: CHEST  1 VIEW COMPARISON:  04/02/2019 FINDINGS: Cardiac shadow is at the upper limits of normal in size. The lungs are hypoinflated with bibasilar atelectasis left greater than right. No pneumothorax or sizable effusion is seen. No bony abnormality is noted. IMPRESSION: Bibasilar atelectasis as described. Electronically Signed   By: Inez Catalina  M.D.   On: 12/25/2019 15:16     LOS: 2 days   Oren Binet, MD  [facility]    To contact the attending provider between 7A-7P or the covering provider during after hours 7P-7A, please log into the web site [email or web address] and access using universal [place] password for that web site. If you do not have the password, please call the hospital operator.  12/26/2019, 3:06 PM

## 2019-12-26 NOTE — Progress Notes (Signed)
Progress Note for [facility]  Subjective: Patient is very sleepy.  Objective: Vital signs in last 24 hours: Temp:  [97.8 F (36.6 C)-98.4 F (36.9 C)] 98.4 F (36.9 C) (04/17 0800) Pulse Rate:  [92-110] 104 (04/17 0800) Resp:  [19-31] 31 (04/17 0800) BP: (77-119)/(41-89) 77/41 (04/17 0800) SpO2:  [94 %-99 %] 96 % (04/17 0800) Weight:  [62.8 kg] 62.8 kg (04/17 0357) Last BM Date: 12/25/19  Intake/Output from previous day: 04/16 0701 - 04/17 0700 In: 1966.2 [P.O.:1200; I.V.:666.2; IV Piggyback:100] Out: -  Intake/Output this shift: No intake/output data recorded.  General appearance: sleepy, arousable with sternal rub, but falls back to sleep again GI: mild tenderness throughout the abdomen, no rebound, no rigidity.  Lab Results: Recent Labs    12/24/19 0919 12/25/19 0415 12/25/19 1405  WBC 17.4* 17.8* 20.2*  HGB 7.7* 7.1* 7.8*  HCT 24.3* 22.8* 25.4*  PLT 100* 85* 91*   BMET Recent Labs    12/24/19 0130 12/24/19 0919 12/25/19 0415  NA 130* 135 132*  K 2.3* 2.9* 3.7  CL 89* 96* 104  CO2 17* 23 20*  GLUCOSE 126* 105* 92  BUN <5* <5* <5*  CREATININE 1.10* 0.84 0.75  CALCIUM 8.5* 8.3* 7.5*   LFT Recent Labs    12/24/19 0130 12/24/19 0919 12/25/19 0415  PROT 9.7*   < > 7.3  ALBUMIN 1.6*   < > 1.3*  AST 164*   < > 152*  ALT 32   < > 26  ALKPHOS 207*   < > 163*  BILITOT 28.9*   < > 21.8*  BILIDIR 15.4*  --   --   IBILI 13.5*  --   --    < > = values in this interval not displayed.   PT/INR Recent Labs    12/24/19 0919 12/25/19 0415  LABPROT 27.9* 25.2*  INR 2.6* 2.3*   Hepatitis Panel No results for input(s): HEPBSAG, HCVAB, HEPAIGM, HEPBIGM in the last 72 hours. C-Diff No results for input(s): CDIFFTOX in the last 72 hours. Fecal Lactopherrin No results for input(s): FECLLACTOFRN in the last 72 hours.  Studies/Results: DG Chest 1 View  Result Date: 12/25/2019 CLINICAL DATA:  Leukocytosis EXAM: CHEST  1 VIEW COMPARISON:  04/02/2019  FINDINGS: Cardiac shadow is at the upper limits of normal in size. The lungs are hypoinflated with bibasilar atelectasis left greater than right. No pneumothorax or sizable effusion is seen. No bony abnormality is noted. IMPRESSION: Bibasilar atelectasis as described. Electronically Signed   By: Inez Catalina M.D.   On: 12/25/2019 15:16    Medications:  Scheduled: . lactulose  30 g Oral TID  . lactulose  300 mL Rectal Q4H  . multivitamin with minerals  1 tablet Oral Daily  . sodium chloride flush  3 mL Intravenous Q12H  . thiamine injection  100 mg Intravenous Daily   Continuous: . cefTRIAXone (ROCEPHIN)  IV 2 g (12/26/19 0914)  . octreotide  (SANDOSTATIN)    IV infusion 50 mcg/hr (12/26/19 0351)  . pantoprozole (PROTONIX) infusion 8 mg/hr (12/26/19 0348)  . banana bag IV 1000 mL 100 mL/hr at 12/25/19 1132    Assessment/Plan: 1) ETOH Hepatitis. 2) Leukocytosis. 3) ? Hepatic encephalopathy.   She is very somnolent.  The patient was examined with Dr. Sloan Leiter.  He stated that the patient was A&O yesterday.  The most likely has hepatic encephalopathy.  Her CXR was normal, but no UA was performed.  Her DI is 67.8.  Plan: 1) Start steroids once  the UA is clear, although she may need to be treated if the UA is not obtained in a reasonable time limit. 2) Lactulose enema. 3) Monitor for ETOH withdrawal.   LOS: 2 days   Ryley Teater D 12/26/2019, 9:33 AM

## 2019-12-26 NOTE — H&P (View-Only) (Signed)
Progress Note for [facility]  Subjective: Patient is very sleepy.  Objective: Vital signs in last 24 hours: Temp:  [97.8 F (36.6 C)-98.4 F (36.9 C)] 98.4 F (36.9 C) (04/17 0800) Pulse Rate:  [92-110] 104 (04/17 0800) Resp:  [19-31] 31 (04/17 0800) BP: (77-119)/(41-89) 77/41 (04/17 0800) SpO2:  [94 %-99 %] 96 % (04/17 0800) Weight:  [62.8 kg] 62.8 kg (04/17 0357) Last BM Date: 12/25/19  Intake/Output from previous day: 04/16 0701 - 04/17 0700 In: 1966.2 [P.O.:1200; I.V.:666.2; IV Piggyback:100] Out: -  Intake/Output this shift: No intake/output data recorded.  General appearance: sleepy, arousable with sternal rub, but falls back to sleep again GI: mild tenderness throughout the abdomen, no rebound, no rigidity.  Lab Results: Recent Labs    12/24/19 0919 12/25/19 0415 12/25/19 1405  WBC 17.4* 17.8* 20.2*  HGB 7.7* 7.1* 7.8*  HCT 24.3* 22.8* 25.4*  PLT 100* 85* 91*   BMET Recent Labs    12/24/19 0130 12/24/19 0919 12/25/19 0415  NA 130* 135 132*  K 2.3* 2.9* 3.7  CL 89* 96* 104  CO2 17* 23 20*  GLUCOSE 126* 105* 92  BUN <5* <5* <5*  CREATININE 1.10* 0.84 0.75  CALCIUM 8.5* 8.3* 7.5*   LFT Recent Labs    12/24/19 0130 12/24/19 0919 12/25/19 0415  PROT 9.7*   < > 7.3  ALBUMIN 1.6*   < > 1.3*  AST 164*   < > 152*  ALT 32   < > 26  ALKPHOS 207*   < > 163*  BILITOT 28.9*   < > 21.8*  BILIDIR 15.4*  --   --   IBILI 13.5*  --   --    < > = values in this interval not displayed.   PT/INR Recent Labs    12/24/19 0919 12/25/19 0415  LABPROT 27.9* 25.2*  INR 2.6* 2.3*   Hepatitis Panel No results for input(s): HEPBSAG, HCVAB, HEPAIGM, HEPBIGM in the last 72 hours. C-Diff No results for input(s): CDIFFTOX in the last 72 hours. Fecal Lactopherrin No results for input(s): FECLLACTOFRN in the last 72 hours.  Studies/Results: DG Chest 1 View  Result Date: 12/25/2019 CLINICAL DATA:  Leukocytosis EXAM: CHEST  1 VIEW COMPARISON:  04/02/2019  FINDINGS: Cardiac shadow is at the upper limits of normal in size. The lungs are hypoinflated with bibasilar atelectasis left greater than right. No pneumothorax or sizable effusion is seen. No bony abnormality is noted. IMPRESSION: Bibasilar atelectasis as described. Electronically Signed   By: Inez Catalina M.D.   On: 12/25/2019 15:16    Medications:  Scheduled: . lactulose  30 g Oral TID  . lactulose  300 mL Rectal Q4H  . multivitamin with minerals  1 tablet Oral Daily  . sodium chloride flush  3 mL Intravenous Q12H  . thiamine injection  100 mg Intravenous Daily   Continuous: . cefTRIAXone (ROCEPHIN)  IV 2 g (12/26/19 0914)  . octreotide  (SANDOSTATIN)    IV infusion 50 mcg/hr (12/26/19 0351)  . pantoprozole (PROTONIX) infusion 8 mg/hr (12/26/19 0348)  . banana bag IV 1000 mL 100 mL/hr at 12/25/19 1132    Assessment/Plan: 1) ETOH Hepatitis. 2) Leukocytosis. 3) ? Hepatic encephalopathy.   She is very somnolent.  The patient was examined with Dr. Sloan Leiter.  He stated that the patient was A&O yesterday.  The most likely has hepatic encephalopathy.  Her CXR was normal, but no UA was performed.  Her DI is 67.8.  Plan: 1) Start steroids once  the UA is clear, although she may need to be treated if the UA is not obtained in a reasonable time limit. 2) Lactulose enema. 3) Monitor for ETOH withdrawal.   LOS: 2 days   Shinita Mac D 12/26/2019, 9:33 AM

## 2019-12-27 ENCOUNTER — Encounter (HOSPITAL_COMMUNITY): Payer: Self-pay | Admitting: Internal Medicine

## 2019-12-27 ENCOUNTER — Encounter (HOSPITAL_COMMUNITY)
Admission: EM | Disposition: A | Discharge status: Rehabilitation Facility | Payer: Self-pay | Source: Home / Self Care | Attending: Internal Medicine

## 2019-12-27 ENCOUNTER — Inpatient Hospital Stay (HOSPITAL_COMMUNITY): Payer: Medicaid Other | Admitting: Certified Registered Nurse Anesthetist

## 2019-12-27 DIAGNOSIS — K7290 Hepatic failure, unspecified without coma: Secondary | ICD-10-CM | POA: Diagnosis not present

## 2019-12-27 DIAGNOSIS — K7010 Alcoholic hepatitis without ascites: Secondary | ICD-10-CM | POA: Diagnosis not present

## 2019-12-27 DIAGNOSIS — D649 Anemia, unspecified: Secondary | ICD-10-CM | POA: Diagnosis not present

## 2019-12-27 DIAGNOSIS — F10239 Alcohol dependence with withdrawal, unspecified: Secondary | ICD-10-CM | POA: Diagnosis not present

## 2019-12-27 HISTORY — PX: ESOPHAGOGASTRODUODENOSCOPY (EGD) WITH PROPOFOL: SHX5813

## 2019-12-27 LAB — CBC
HCT: 28.9 % — ABNORMAL LOW (ref 36.0–46.0)
HCT: 27.8 % — ABNORMAL LOW (ref 36.0–46.0)
Hemoglobin: 9.5 g/dL — ABNORMAL LOW (ref 12.0–15.0)
Hemoglobin: 9 g/dL — ABNORMAL LOW (ref 12.0–15.0)
MCH: 35.6 pg — ABNORMAL HIGH (ref 26.0–34.0)
MCH: 34.7 pg — ABNORMAL HIGH (ref 26.0–34.0)
MCHC: 32.9 g/dL (ref 30.0–36.0)
MCHC: 32.4 g/dL (ref 30.0–36.0)
MCV: 108.2 fL — ABNORMAL HIGH (ref 80.0–100.0)
MCV: 107.3 fL — ABNORMAL HIGH (ref 80.0–100.0)
Platelets: 59 10*3/uL — ABNORMAL LOW (ref 150–400)
Platelets: 54 10*3/uL — ABNORMAL LOW (ref 150–400)
RBC: 2.67 MIL/uL — ABNORMAL LOW (ref 3.87–5.11)
RBC: 2.59 MIL/uL — ABNORMAL LOW (ref 3.87–5.11)
RDW: 24.1 % — ABNORMAL HIGH (ref 11.5–15.5)
RDW: 24.8 % — ABNORMAL HIGH (ref 11.5–15.5)
WBC: 19 10*3/uL — ABNORMAL HIGH (ref 4.0–10.5)
WBC: 17.1 10*3/uL — ABNORMAL HIGH (ref 4.0–10.5)
nRBC: 0.6 % — ABNORMAL HIGH (ref 0.0–0.2)
nRBC: 0.6 % — ABNORMAL HIGH (ref 0.0–0.2)

## 2019-12-27 LAB — TYPE AND SCREEN
ABO/RH(D): O POS
Antibody Screen: NEGATIVE
Unit division: 0
Unit division: 0

## 2019-12-27 LAB — COMPREHENSIVE METABOLIC PANEL
ALT: 27 U/L (ref 0–44)
AST: 131 U/L — ABNORMAL HIGH (ref 15–41)
Albumin: 1.1 g/dL — ABNORMAL LOW (ref 3.5–5.0)
Alkaline Phosphatase: 149 U/L — ABNORMAL HIGH (ref 38–126)
Anion gap: 9 (ref 5–15)
BUN: <5 mg/dL — ABNORMAL LOW (ref 6–20)
CO2: 17 mmol/L — ABNORMAL LOW (ref 22–32)
Calcium: 7.6 mg/dL — ABNORMAL LOW (ref 8.9–10.3)
Chloride: 109 mmol/L (ref 98–111)
Creatinine, Ser: 1.2 mg/dL — ABNORMAL HIGH (ref 0.44–1.00)
GFR calc Af Amer: >60 mL/min (ref >60)
GFR calc non Af Amer: 56 mL/min — ABNORMAL LOW (ref >60)
Glucose, Bld: 115 mg/dL — ABNORMAL HIGH (ref 70–99)
Potassium: 3.2 mmol/L — ABNORMAL LOW (ref 3.5–5.1)
Sodium: 135 mmol/L (ref 135–145)
Total Bilirubin: 20.8 mg/dL (ref 0.3–1.2)
Total Protein: 6.6 g/dL (ref 6.5–8.1)

## 2019-12-27 LAB — BPAM RBC
Blood Product Expiration Date: 202105182359
Blood Product Expiration Date: 202105182359
ISSUE DATE / TIME: 202104171337
ISSUE DATE / TIME: 202104171624
Unit Type and Rh: 5100
Unit Type and Rh: 5100

## 2019-12-27 LAB — PROTIME-INR
INR: 3.4 — ABNORMAL HIGH (ref 0.8–1.2)
Prothrombin Time: 34.7 seconds — ABNORMAL HIGH (ref 11.4–15.2)

## 2019-12-27 LAB — AMMONIA: Ammonia: 77 umol/L — ABNORMAL HIGH (ref 9–35)

## 2019-12-27 SURGERY — ESOPHAGOGASTRODUODENOSCOPY (EGD) WITH PROPOFOL
Anesthesia: Monitor Anesthesia Care

## 2019-12-27 MED ORDER — LACTATED RINGERS IV SOLN: INTRAVENOUS | Status: DC | PRN

## 2019-12-27 MED ORDER — PROPOFOL 10 MG/ML IV BOLUS
INTRAVENOUS | Status: DC | PRN
  Administered 2019-12-27 (×2): 10 mg via INTRAVENOUS
  Administered 2019-12-27: 5 mg via INTRAVENOUS
  Administered 2019-12-27 (×3): 10 mg via INTRAVENOUS
  Administered 2019-12-27: 5 mg via INTRAVENOUS

## 2019-12-27 MED ORDER — PROPOFOL 500 MG/50ML IV EMUL
INTRAVENOUS | Status: DC | PRN
  Administered 2019-12-27: 35 ug/kg/min via INTRAVENOUS

## 2019-12-27 MED ORDER — LACTULOSE ENEMA
300.0000 mL | Freq: Once | ORAL | Status: AC
  Administered 2019-12-27: 300 mL via RECTAL

## 2019-12-27 MED ORDER — LIDOCAINE HCL (CARDIAC) PF 100 MG/5ML IV SOSY
PREFILLED_SYRINGE | INTRAVENOUS | Status: DC | PRN
  Administered 2019-12-27: 20 mg via INTRATRACHEAL

## 2019-12-27 MED ORDER — SODIUM CHLORIDE 0.9 % IV SOLN: INTRAVENOUS | Status: DC

## 2019-12-27 MED ORDER — PHENYLEPHRINE HCL (PRESSORS) 10 MG/ML IV SOLN
INTRAVENOUS | Status: DC | PRN
  Administered 2019-12-27: 40 ug via INTRAVENOUS

## 2019-12-27 MED ORDER — FOLIC ACID 1 MG PO TABS
1.0000 mg | ORAL_TABLET | Freq: Every day | ORAL | Status: DC
  Administered 2019-12-28 – 2019-12-30 (×3): 1 mg via ORAL
  Filled 2019-12-27 (×3): qty 1

## 2019-12-27 MED ORDER — POTASSIUM CHLORIDE 10 MEQ/100ML IV SOLN
10.0000 meq | INTRAVENOUS | Status: AC
  Administered 2019-12-27 (×2): 10 meq via INTRAVENOUS
  Filled 2019-12-27 (×2): qty 100

## 2019-12-27 MED ORDER — LACTATED RINGERS IV SOLN
INTRAVENOUS | Status: AC | PRN
  Administered 2019-12-27: 1000 mL via INTRAVENOUS

## 2019-12-27 SURGICAL SUPPLY — 15 items

## 2019-12-27 NOTE — Transfer of Care (Signed)
Immediate Anesthesia Transfer of Care Note  Patient: Joann [name]  Procedure(s) Performed: ESOPHAGOGASTRODUODENOSCOPY (EGD) WITH PROPOFOL (N/A )  Patient Location: Endoscopy Unit  Anesthesia Type:MAC  Level of Consciousness: sedated  Airway & Oxygen Therapy: Patient Spontanous Breathing and Patient connected to nasal cannula oxygen  Post-op Assessment: Report given to RN, Post -op Vital signs reviewed and stable and Patient moving all extremities X 4  Post vital signs: Reviewed and stable  Last Vitals:  Vitals Value Taken Time  BP    Temp    Pulse    Resp    SpO2      Last Pain:  Vitals:   12/27/19 1145  TempSrc: Oral  PainSc: 0-No pain         Complications: No apparent anesthesia complications

## 2019-12-27 NOTE — Progress Notes (Signed)
PROGRESS NOTE        PATIENT DETAILS Name: Joann [name] Age: 39 y.o. Sex: female Date of Birth: 09-23-1980 Admit Date: 12/23/2019 Admitting Physician Karmen Bongo, MD [id number], Judson Roch, PA-C  Brief Narrative: Patient is a 39 y.o. female history of alcoholic hepatitis, probable liver cirrhosis-longstanding history of alcohol abuse-presenting with confusion-found to have acute hepatic encephalopathy, possible acute blood loss anemia related to upper GI bleeding.  Subsequently admitted to the hospitalist service-see below for further details.  Significant events: 4/14>> presented to the ED with confusion-found to have hepatic encephalopathy  Antimicrobial therapy: Rocephin: 4/14>>  Microbiology data: None  Procedures : None  Consults: GI  DVT Prophylaxis : SCD's  Subjective: Much more awake and alert-Per nursing staff had multiple bowel movements yesterday.  Assessment/Plan: Hepatic encephalopathy: Overall much better-no longer somnolent-awake/alert and answering questions appropriately.  Ammonia level decreasing.  Continue lactulose orally.   Alcoholic hepatitis: Overtly jaundiced-LFTs/bilirubin slowly downtrending.  Remains on steroids-started on 4/17.  UA/chest x-ray without any obvious infection.  Blood cultures negative so far.    Suspected upper GI bleeding and acute blood loss anemia: Bloody stools noted on rectal exam in the emergency room-but no overt bleeding.  Seems to be a poor/unreliable historian-did acknowledge that she may have had hematemesis/hemoptysis a few days prior to this hospitalization-hemoglobin decreased to 6.0 on 4/17-subsequently given 2 units of PRBC-hemoglobin now stable.  Continue PPI/octreotide infusion.  For EGD today.  Remains on empiric Rocephin for SBP prophylaxis.    Coagulopathy/thrombocytopenia: Likely secondary to alcoholic hepatitis/liver cirrhosis.  Supportive care.  Alcohol withdrawal: Significantly less  tremulous-Per patient (confirmed with spouse) her last drink was approximately 7-10 days prior to hospitalization.  She is relatively awake and alert-clinical picture is clouded by hepatic encephalopathy-but do not think she has significant/serious withdrawal symptoms at this time.  Continue Ativan per CIWA protocol-remains on MVI/thiamine.  When she is a bit more awake and alert-we will need counseling regarding importance of complete abstinence from alcohol.    Menorrhagia: Per patient she has been on her menstrual period for approximately 2 weeks now-apparently this has occurred in the past as well-and she has made an appointment with a GYN MD at [place]-this history was confirmed with the patient's spouse over the phone.  Pregnancy test on admission was negative.  Pelvic/transvaginal ultrasound without any major abnormalities.  AKI : Improved-likely supportive care-remains on midodrine as blood pressure remains soft.  Follow periodically-avoid nephrotoxic agents.  Hypokalemia: Replete and recheck  Diet: Diet Order            Diet NPO time specified  Diet effective midnight              Code Status: Full code  Family Communication: Spouse over the phone on 4/17  Disposition Plan: Remain inpatient-not stable for discharge-we will probably require home health services when ready  Barriers to Discharge: Hepatic encephalopathy, severe blood loss anemia-suspected upper GI bleeding-ongoing alcoholic hepatitis.   Antimicrobial agents: Anti-infectives (From admission, onward)   Start     Dose/Rate Route Frequency Ordered Stop   12/24/19 0906  [MAR Hold]  cefTRIAXone (ROCEPHIN) 2 g in sodium chloride 0.9 % 100 mL IVPB     (MAR Hold since Sun 12/27/2019 at 1143.Hold Reason: Transfer to a Procedural area.)   2 g 200 mL/hr over 30 Minutes Intravenous Every 24 hours 12/24/19 0902  12/24/19 0415  cefTRIAXone (ROCEPHIN) 2 g in sodium chloride 0.9 % 100 mL IVPB     2 g 200 mL/hr over 30  Minutes Intravenous  Once 12/24/19 0408 12/24/19 0706       Time spent: 25 minutes-Greater than 50% of this time was spent in counseling, explanation of diagnosis, planning of further management, and coordination of care.  MEDICATIONS: Scheduled Meds: . [MAR Hold] lactulose  30 g Oral Q6H  . [MAR Hold] melatonin  3 mg Oral QHS  . [MAR Hold] midodrine  5 mg Oral TID WC  . [MAR Hold] multivitamin with minerals  1 tablet Oral Daily  . [MAR Hold] prednisoLONE  40 mg Oral Daily  . [MAR Hold] sodium chloride flush  3 mL Intravenous Q12H  . [MAR Hold] thiamine injection  100 mg Intravenous Daily   Continuous Infusions: . sodium chloride 20 mL/hr at 12/27/19 0344  . [MAR Hold] cefTRIAXone (ROCEPHIN)  IV 2 g (12/27/19 0837)  . lactated ringers 1,000 mL (12/27/19 1146)  . octreotide  (SANDOSTATIN)    IV infusion 50 mcg/hr (12/27/19 0204)  . [facility] Hold] banana bag IV 1000 mL Stopped (12/27/19 0727)   PRN Meds:.lactated ringers, [MAR Hold] ondansetron **OR** [MAR Hold] ondansetron (ZOFRAN) IV, [MAR Hold] sodium chloride flush   PHYSICAL EXAM: Vital signs: Vitals:   12/26/19 2315 12/27/19 0307 12/27/19 0900 12/27/19 1145  BP: (!) 83/62 (!) 87/56 91/61 (!) 85/56  Pulse: 86 77 81 84  Resp: 20 19 19 19   Temp: 97.6 F (36.4 C) (!) 97.5 F (36.4 C)  97.6 F (36.4 C)  TempSrc: Oral Oral  Oral  SpO2: 98% 96% 96% 96%  Weight:    62.8 kg   Filed Weights   12/26/19 0357 12/27/19 1145  Weight: 62.8 kg 62.8 kg   Body mass index is 23.76 kg/m.   Gen Exam:Alert awake-not in any distress HEENT:atraumatic, normocephalic Chest: B/L clear to auscultation anteriorly CVS:S1S2 regular Abdomen:soft non tender, non distended Extremities:no edema Neurology: Non focal Skin: no rash  I have personally reviewed following labs and imaging studies  LABORATORY DATA: CBC: Recent Labs  Lab 12/25/19 0415 12/25/19 1405 12/26/19 1044 12/26/19 2233 12/27/19 0352  WBC 17.8* 20.2* 21.6* 19.0* 17.1*   HGB 7.1* 7.8* 6.0* 9.5* 9.0*  HCT 22.8* 25.4* 19.4* 28.9* 27.8*  MCV 117.5* 121.5* 121.3* 108.2* 107.3*  PLT 85* 91* 64* 59* 54*    Basic Metabolic Panel: Recent Labs  Lab 12/24/19 0130 12/24/19 0919 12/25/19 0415 12/26/19 1044 12/27/19 0352  NA 130* 135 132* 130* 135  K 2.3* 2.9* 3.7 4.1 3.2*  CL 89* 96* 104 104 109  CO2 17* 23 20* 17* 17*  GLUCOSE 126* 105* 92 106* 115*  BUN <5* <5* <5* <5* <5*  CREATININE 1.10* 0.84 0.75 1.48* 1.20*  CALCIUM 8.5* 8.3* 7.5* 7.5* 7.6*  MG 1.8  --   --  2.0  --   PHOS 3.7 3.3  --   --   --     GFR: Estimated Creatinine Clearance: 53.8 mL/min (A) (by C-G formula based on SCr of 1.2 mg/dL (H)).  Liver Function Tests: Recent Labs  Lab 12/24/19 0130 12/24/19 0919 12/25/19 0415 12/26/19 1044 12/27/19 0352  AST 164* 166* 152* 138* 131*  ALT 32 33 26 26 27   ALKPHOS 207* 183* 163* 141* 149*  BILITOT 28.9* 24.3* 21.8* 20.0* 20.8*  PROT 9.7* 8.2* 7.3 6.6 6.6  ALBUMIN 1.6* 1.5* 1.3* 1.1* 1.1*   Recent Labs  Lab  12/24/19 0130  LIPASE 18   Recent Labs  Lab 12/24/19 0312 12/26/19 1044 12/27/19 0352  AMMONIA 137* 92* 77*    Coagulation Profile: Recent Labs  Lab 12/24/19 0130 12/24/19 0919 12/25/19 0415 12/27/19 0352  INR 2.4* 2.6* 2.3* 3.4*    Cardiac Enzymes: No results for input(s): CKTOTAL, CKMB, CKMBINDEX, TROPONINI in the last 168 hours.  BNP (last 3 results) No results for input(s): PROBNP in the last 8760 hours.  Lipid Profile: No results for input(s): CHOL, HDL, LDLCALC, TRIG, CHOLHDL, LDLDIRECT in the last 72 hours.  Thyroid Function Tests: No results for input(s): TSH, T4TOTAL, FREET4, T3FREE, THYROIDAB in the last 72 hours.  Anemia Panel: Recent Labs    12/25/19 1405  VITAMINB12 4,128*  FOLATE 21.3  FERRITIN 234  TIBC 133*  IRON 74  RETICCTPCT 6.2*    Urine analysis:    Component Value Date/Time   COLORURINE AMBER (A) 12/26/2019 0920   APPEARANCEUR CLOUDY (A) 12/26/2019 0920   LABSPEC 1.019  12/26/2019 0920   PHURINE 5.0 12/26/2019 0920   GLUCOSEU NEGATIVE 12/26/2019 0920   HGBUR NEGATIVE 12/26/2019 0920   BILIRUBINUR MODERATE (A) 12/26/2019 0920   KETONESUR 5 (A) 12/26/2019 0920   PROTEINUR 30 (A) 12/26/2019 0920   NITRITE NEGATIVE 12/26/2019 0920   LEUKOCYTESUR NEGATIVE 12/26/2019 0920    Sepsis Labs: Lactic Acid, Venous    Component Value Date/Time   LATICACIDVEN 2.0 (HH) 11/21/2018 2319    MICROBIOLOGY: Recent Results (from the past 240 hour(s))  Respiratory Panel by RT PCR (Flu A&B, Covid) - Nasopharyngeal Swab     Status: None   Collection Time: 12/24/19  3:04 AM   Specimen: Nasopharyngeal Swab  Result Value Ref Range Status   SARS Coronavirus 2 by RT PCR NEGATIVE NEGATIVE Final    Comment: (NOTE) SARS-CoV-2 target nucleic acids are NOT DETECTED. The SARS-CoV-2 RNA is generally detectable in upper respiratoy specimens during the acute phase of infection. The lowest concentration of SARS-CoV-2 viral copies this assay can detect is 131 copies/mL. A negative result does not preclude SARS-Cov-2 infection and should not be used as the sole basis for treatment or other patient management decisions. A negative result may occur with  improper specimen collection/handling, submission of specimen other than nasopharyngeal swab, presence of viral mutation(s) within the areas targeted by this assay, and inadequate number of viral copies (<131 copies/mL). A negative result must be combined with clinical observations, patient history, and epidemiological information. The expected result is Negative. Fact Sheet for Patients:  PinkCheek.be Fact Sheet for Healthcare Providers:  GravelBags.it This test is not yet ap proved or cleared by the [place] FDA and  has been authorized for detection and/or diagnosis of SARS-CoV-2 by FDA under an Emergency Use Authorization (EUA). This EUA will remain  in effect  (meaning this test can be used) for the duration of the COVID-19 declaration under Section 564(b)(1) of the Act, 21 U.S.C. section 360bbb-3(b)(1), unless the authorization is terminated or revoked sooner.    Influenza A by PCR NEGATIVE NEGATIVE Final   Influenza B by PCR NEGATIVE NEGATIVE Final    Comment: (NOTE) The Xpert Xpress SARS-CoV-2/FLU/RSV assay is intended as an aid in  the diagnosis of influenza from Nasopharyngeal swab specimens and  should not be used as a sole basis for treatment. Nasal washings and  aspirates are unacceptable for Xpert Xpress SARS-CoV-2/FLU/RSV  testing. Fact Sheet for Patients: PinkCheek.be Fact Sheet for Healthcare Providers: GravelBags.it This test is not yet approved or cleared by  the [employer] and  has been authorized for detection and/or diagnosis of SARS-CoV-2 by  FDA under an Emergency Use Authorization (EUA). This EUA will remain  in effect (meaning this test can be used) for the duration of the  Covid-19 declaration under Section 564(b)(1) of the Act, 21  U.S.C. section 360bbb-3(b)(1), unless the authorization is  terminated or revoked. Performed at [facility], [place] [address], [place], [place] [address]   SARS Coronavirus 2 by RT PCR     Status: None   Collection Time: 12/24/19  3:04 AM  Result Value Ref Range Status   SARS Coronavirus 2 NEGATIVE NEGATIVE Final    Comment: (NOTE) Result indicates the ABSENCE of SARS-CoV-2 RNA in the patient specimen.  The lowest concentration of SARS-CoV-2 viral copies this assay can detect in nasopharyngeal swab specimens is 500 copies / mL.  A negative result does not preclude SARS-CoV-2 infection and should not be used as the sole basis for patient management decisions. A negative result may occur with improper specimen collection / handling, submission of a specimen other than nasopharyngeal swab, presence of viral mutation(s)  within the areas targeted by this assay, and inadequate number of viral copies (<500 copies / mL) present.  Negative results must be combined with clinical observations, patient history, and epidemiological information.  The expected result is NEGATIVE.  Patient Fact Sheet:  BlogSelections.co.uk   Provider Fact Sheet:  https://lucas.com/   This test is not yet approved or cleared by the [place] FDA and  has been authorized for  detection and/or diagnosis of SARS-CoV-2 by FDA under an Emergency Use Authorization (EUA).  This EUA will remain in effect (meaning this test can be used) for the duration of  the COVID-19 declaration under Section 564(b)(1) of the Act, 21 U.S.C. section 360bbb-3(b)(1), unless the authorization is terminated or revoked sooner Performed at [facility], [place] [address], [place], [place] [address]   Culture, blood (routine x 2)     Status: None (Preliminary result)   Collection Time: 12/26/19  3:08 PM   Specimen: BLOOD RIGHT HAND  Result Value Ref Range Status   Specimen Description BLOOD RIGHT HAND  Final   Special Requests   Final    BOTTLES DRAWN AEROBIC AND ANAEROBIC Blood Culture adequate volume   Culture   Final    NO GROWTH < 24 HOURS Performed at [facility], [place] [address] [place], [place] [address]    Report Status PENDING  Incomplete  Culture, blood (routine x 2)     Status: None (Preliminary result)   Collection Time: 12/26/19  3:17 PM   Specimen: BLOOD RIGHT WRIST  Result Value Ref Range Status   Specimen Description BLOOD RIGHT WRIST  Final   Special Requests   Final    BOTTLES DRAWN AEROBIC AND ANAEROBIC Blood Culture adequate volume   Culture   Final    NO GROWTH < 24 HOURS Performed at [facility], [place] [address], [place],  [address]    Report Status PENDING  Incomplete    RADIOLOGY STUDIES/RESULTS: DG Chest 1 View  Result Date: 12/25/2019 CLINICAL DATA:   Leukocytosis EXAM: CHEST  1 VIEW COMPARISON:  04/02/2019 FINDINGS: Cardiac shadow is at the upper limits of normal in size. The lungs are hypoinflated with bibasilar atelectasis left greater than right. No pneumothorax or sizable effusion is seen. No bony abnormality is noted. IMPRESSION: Bibasilar atelectasis as described. Electronically Signed   By:  Inez Catalina M.D.   On: 12/25/2019 15:16   US PELVIC COMPLETE WITH TRANSVAGINAL  Result Date: 12/26/2019 CLINICAL DATA:  Menorrhagia.  Low hemoglobin. EXAM: TRANSABDOMINAL AND TRANSVAGINAL ULTRASOUND OF PELVIS TECHNIQUE: Both transabdominal and transvaginal ultrasound examinations of the pelvis were performed. Transabdominal technique was performed for global imaging of the pelvis including uterus, ovaries, adnexal regions, and pelvic cul-de-sac. It was necessary to proceed with endovaginal exam following the transabdominal exam to visualize the ovaries and better visualize the uterus and endometrium. COMPARISON:  None FINDINGS: Uterus Measurements: 7.2 x 5.0 x 3.5 cm = volume: 65 mL. No fibroids or other mass visualized. Endometrium Thickness: 7 mm.  Poorly visualized.  No focal abnormality seen. Right ovary Not visualized. Left ovary Not visualized. Other findings Small amount of free peritoneal fluid. IMPRESSION: 1. Normal appearing uterus with the exception of poor visualization of the endometrium. 2. Small amount of free peritoneal fluid. 3. Nonvisualized ovaries. Electronically Signed   By: Claudie Revering M.D.   On: 12/26/2019 19:24     LOS: 3 days   Oren Binet, MD  [facility]    To contact the attending provider between 7A-7P or the covering provider during after hours 7P-7A, please log into the web site [email or web address] and access using universal [place] password for that web site. If you do not have the password, please call the hospital operator.  12/27/2019, 12:21 PM

## 2019-12-27 NOTE — Op Note (Signed)
[facility] Patient Name: Joann [name] Procedure Date : 12/27/2019 MRN: [id number] Attending MD: Carol Ada , MD Date of Birth: 26-Dec-1980 CSN: [id number] Age: 39 Admit Type: Inpatient Procedure:                Upper GI endoscopy Indications:              Iron deficiency anemia, Hematemesis Providers:                Carol Ada, MD, Grace Isaac, RN, Janeece Agee,                            Technician, Clearnce Sorrel, CRNA Referring MD:              Medicines:                Propofol per Anesthesia Complications:            No immediate complications. Estimated Blood Loss:     Estimated blood loss: none. Procedure:                Pre-Anesthesia Assessment:                           - Prior to the procedure, a History and Physical                            was performed, and patient medications and                            allergies were reviewed. The patient's tolerance of                            previous anesthesia was also reviewed. The risks                            and benefits of the procedure and the sedation                            options and risks were discussed with the patient.                            All questions were answered, and informed consent                            was obtained. Prior Anticoagulants: The patient has                            taken no previous anticoagulant or antiplatelet                            agents. ASA Grade Assessment: III - A patient with                            severe systemic disease. After reviewing the risks  and benefits, the patient was deemed in                            satisfactory condition to undergo the procedure.                           - Sedation was administered by an anesthesia                            professional. Deep sedation was attained.                           After obtaining informed consent, the endoscope was                            passed under  direct vision. Throughout the                            procedure, the patient's blood pressure, pulse, and                            oxygen saturations were monitored continuously. The                            [id number]-H190 ([id number]) Olympus gastroscope was                            introduced through the mouth, and advanced to the                            second part of duodenum. The upper GI endoscopy was                            technically difficult and complex due to presence                            of food. The patient tolerated the procedure well. Scope In: Scope Out: Findings:      Small (< 5 mm) varices were found in the lower third of the esophagus.      A large amount of food (residue) was found in the gastric fundus and in       the gastric body.      The examined duodenum was normal.      There was no evidence of any blood in the upper GI tract. The stomach       was obscured, but there was no blood mixed with the gastric contents. Impression:               - Small (< 5 mm) esophageal varices.                           - A large amount of food (residue) in the stomach.                           - Normal examined duodenum.                           -  No specimens collected. Recommendation:           - Return patient to hospital ward for ongoing care.                           - Resume regular diet.                           - Continue present medications. Procedure Code(s):        --- Professional ---                           [id number], Esophagogastroduodenoscopy, flexible,                            transoral; diagnostic, including collection of                            specimen(s) by brushing or washing, when performed                            (separate procedure) Diagnosis Code(s):        --- Professional ---                           I85.00, Esophageal varices without bleeding                           D50.9, Iron deficiency anemia, unspecified                            K92.0, Hematemesis CPT copyright 2019 American Medical Association. All rights reserved. The codes documented in this report are preliminary and upon coder review may  be revised to meet current compliance requirements. Carol Ada, MD Carol Ada, MD 12/27/2019 12:28:27 PM This report has been signed electronically. Number of Addenda: 0

## 2019-12-27 NOTE — Interval H&P Note (Signed)
History and Physical Interval Note:  12/27/2019 11:47 AM  [name]  has presented today for surgery, with the diagnosis of variceal screen, hematemesis.  The various methods of treatment have been discussed with the patient and family. After consideration of risks, benefits and other options for treatment, the patient has consented to  Procedure(s): ESOPHAGOGASTRODUODENOSCOPY (EGD) WITH PROPOFOL (N/A) as a surgical intervention.  The patient's history has been reviewed, patient examined, no change in status, stable for surgery.  I have reviewed the patient's chart and labs.  Questions were answered to the patient's satisfaction.     Haston Casebolt D

## 2019-12-27 NOTE — Anesthesia Preprocedure Evaluation (Signed)
Anesthesia Evaluation  Patient identified by MRN, date of birth, ID band Patient awake    Reviewed: Allergy & Precautions, NPO status , Patient's Chart, lab work & pertinent test results  History of Anesthesia Complications Negative for: history of anesthetic complications  Airway Mallampati: III  TM Distance: >3 FB Neck ROM: Full    Dental  (+) Teeth Intact, Dental Advisory Given,    Pulmonary neg pulmonary ROS, neg recent URI,    breath sounds clear to auscultation       Cardiovascular hypertension, (-) angina(-) Past MI  Rhythm:Regular     Neuro/Psych PSYCHIATRIC DISORDERS Anxiety negative neurological ROS     GI/Hepatic (+)     substance abuse  alcohol use, Hepatitis -, Toxin Related? GI bleed   Endo/Other  negative endocrine ROS  Renal/GU Renal InsufficiencyRenal disease     Musculoskeletal   Abdominal   Peds  Hematology  (+) Blood dyscrasia, anemia ,   Anesthesia Other Findings   Reproductive/Obstetrics                             Anesthesia Physical Anesthesia Plan  ASA: III  Anesthesia Plan: MAC   Post-op Pain Management:    Induction: Intravenous  PONV Risk Score and Plan: 2 and Treatment may vary due to age or medical condition and Propofol infusion  Airway Management Planned: Nasal Cannula  Additional Equipment: None  Intra-op Plan:   Post-operative Plan:   Informed Consent: I have reviewed the patients History and Physical, chart, labs and discussed the procedure including the risks, benefits and alternatives for the proposed anesthesia with the patient or authorized representative who has indicated his/her understanding and acceptance.     Dental advisory given  Plan Discussed with: CRNA and Surgeon  Anesthesia Plan Comments:         Anesthesia Quick Evaluation

## 2019-12-27 NOTE — Anesthesia Postprocedure Evaluation (Signed)
Anesthesia Post Note  Patient: Joann [name]  Procedure(s) Performed: ESOPHAGOGASTRODUODENOSCOPY (EGD) WITH PROPOFOL (N/A )     Patient location during evaluation: Endoscopy Anesthesia Type: MAC Level of consciousness: awake and alert Pain management: pain level controlled Vital Signs Assessment: post-procedure vital signs reviewed and stable Respiratory status: spontaneous breathing, nonlabored ventilation, respiratory function stable and patient connected to nasal cannula oxygen Cardiovascular status: stable and blood pressure returned to baseline Postop Assessment: no apparent nausea or vomiting Anesthetic complications: no    Last Vitals:  Vitals:   12/27/19 1240 12/27/19 1250  BP: (!) 96/57 (!) 90/53  Pulse: 81 84  Resp: (!) 26 (!) 25  Temp:    SpO2: 99% 98%    Last Pain:  Vitals:   12/27/19 1231  TempSrc: Axillary  PainSc:                  Joann [name]

## 2019-12-28 ENCOUNTER — Inpatient Hospital Stay (HOSPITAL_COMMUNITY): Payer: Medicaid Other

## 2019-12-28 ENCOUNTER — Encounter: Payer: Self-pay | Admitting: *Deleted

## 2019-12-28 DIAGNOSIS — D649 Anemia, unspecified: Secondary | ICD-10-CM | POA: Diagnosis not present

## 2019-12-28 DIAGNOSIS — K7010 Alcoholic hepatitis without ascites: Secondary | ICD-10-CM | POA: Diagnosis not present

## 2019-12-28 DIAGNOSIS — K7290 Hepatic failure, unspecified without coma: Secondary | ICD-10-CM | POA: Diagnosis not present

## 2019-12-28 DIAGNOSIS — F10239 Alcohol dependence with withdrawal, unspecified: Secondary | ICD-10-CM | POA: Diagnosis not present

## 2019-12-28 LAB — COMPREHENSIVE METABOLIC PANEL
ALT: 25 U/L (ref 0–44)
AST: 128 U/L — ABNORMAL HIGH (ref 15–41)
Albumin: 1.1 g/dL — ABNORMAL LOW (ref 3.5–5.0)
Alkaline Phosphatase: 164 U/L — ABNORMAL HIGH (ref 38–126)
Anion gap: 10 (ref 5–15)
BUN: <5 mg/dL — ABNORMAL LOW (ref 6–20)
CO2: 18 mmol/L — ABNORMAL LOW (ref 22–32)
Calcium: 8.1 mg/dL — ABNORMAL LOW (ref 8.9–10.3)
Chloride: 105 mmol/L (ref 98–111)
Creatinine, Ser: 1.06 mg/dL — ABNORMAL HIGH (ref 0.44–1.00)
GFR calc Af Amer: >60 mL/min (ref >60)
GFR calc non Af Amer: >60 mL/min (ref >60)
Glucose, Bld: 107 mg/dL — ABNORMAL HIGH (ref 70–99)
Potassium: 3.5 mmol/L (ref 3.5–5.1)
Sodium: 133 mmol/L — ABNORMAL LOW (ref 135–145)
Total Bilirubin: 20.5 mg/dL (ref 0.3–1.2)
Total Protein: 6.8 g/dL (ref 6.5–8.1)

## 2019-12-28 LAB — CBC
HCT: 28.2 % — ABNORMAL LOW (ref 36.0–46.0)
Hemoglobin: 9.2 g/dL — ABNORMAL LOW (ref 12.0–15.0)
MCH: 34.6 pg — ABNORMAL HIGH (ref 26.0–34.0)
MCHC: 32.6 g/dL (ref 30.0–36.0)
MCV: 106 fL — ABNORMAL HIGH (ref 80.0–100.0)
Platelets: 67 10*3/uL — ABNORMAL LOW (ref 150–400)
RBC: 2.66 MIL/uL — ABNORMAL LOW (ref 3.87–5.11)
RDW: 25.6 % — ABNORMAL HIGH (ref 11.5–15.5)
WBC: 21.5 10*3/uL — ABNORMAL HIGH (ref 4.0–10.5)
nRBC: 1.1 % — ABNORMAL HIGH (ref 0.0–0.2)

## 2019-12-28 LAB — PROTIME-INR
INR: 2.6 — ABNORMAL HIGH (ref 0.8–1.2)
Prothrombin Time: 28 seconds — ABNORMAL HIGH (ref 11.4–15.2)

## 2019-12-28 LAB — MAGNESIUM: Magnesium: 2.1 mg/dL (ref 1.7–2.4)

## 2019-12-28 LAB — AMMONIA: Ammonia: 142 umol/L — ABNORMAL HIGH (ref 9–35)

## 2019-12-28 MED ORDER — PANTOPRAZOLE SODIUM 40 MG IV SOLR
40.0000 mg | INTRAVENOUS | Status: DC
  Administered 2019-12-28 – 2019-12-29 (×2): 40 mg via INTRAVENOUS
  Filled 2019-12-28 (×2): qty 40

## 2019-12-28 MED ORDER — RIFAXIMIN 550 MG PO TABS
550.0000 mg | ORAL_TABLET | Freq: Two times a day (BID) | ORAL | Status: DC
  Administered 2019-12-28 – 2019-12-30 (×6): 550 mg via ORAL
  Filled 2019-12-28 (×6): qty 1

## 2019-12-28 MED ORDER — LACTULOSE 10 GM/15ML PO SOLN
30.0000 g | Freq: Once | ORAL | Status: AC
  Administered 2019-12-28: 30 g via ORAL

## 2019-12-28 NOTE — Progress Notes (Signed)
Patient off unit to ultrasound.

## 2019-12-28 NOTE — Progress Notes (Addendum)
PROGRESS NOTE        PATIENT DETAILS Name: Joann [name] Age: 39 y.o. Sex: female Date of Birth: Oct 27, 1980 Admit Date: 12/23/2019 Admitting Physician Karmen Bongo, MD RSW:[email or web address], Judson Roch, PA-C  Brief Narrative: Patient is a 39 y.o. female history of alcoholic hepatitis, probable liver cirrhosis-longstanding history of alcohol abuse-presenting with confusion-found to have acute hepatic encephalopathy, possible acute blood loss anemia related to upper GI bleeding.  Subsequently admitted to the hospitalist service-see below for further details.  Significant events: 4/14>> presented to the ED with confusion-found to have hepatic encephalopathy  Antimicrobial therapy: Rocephin: 4/14>>  Microbiology data: 4/17: Blood cultures>> negative  Procedures : None  Consults: GI  DVT Prophylaxis : SCD's  Subjective: Alert-but is more confused today compared to yesterday, has flapping tremors. Complains of mild abdominal pain today  Assessment/Plan: Hepatic encephalopathy: Appears to be waxing and waning-after improvement yesterday-she is again more encephalopathic today, has flapping tremors present. Has stool in the rectal tube-750 cc of stool output documented in the chart. Ammonia levels are markedly elevated today. Thankfully she is still able to follow some commands and per nursing staff easily able to consume lactulose. Will ask staff to give lactulose x2 2 hours apart-follow closely-add rifaximin. Recheck ammonia levels-follow clinical trajectory.   Alcoholic hepatitis: Overtly jaundiced-LFTs/bilirubin slowly downtrending.  Remains on steroids-started on 4/17.  UA/chest x-ray without any obvious infection.  Blood cultures negative so far.    Suspected upper GI bleeding and acute blood loss anemia: Bloody stools noted on rectal exam in the emergency room-but no overt bleeding.  Seems to be a poor/unreliable historian-did acknowledge that she may have had  hematemesis/hemoptysis a few days prior to this hospitalization-hemoglobin did drop to 6.0 on 4/17-subsequently given 2 units of PRBC-hemoglobin stable at 9.2. Continue to follow closely. EGD did not show any other abnormalities apart from grade 2 varices.  No longer on PPI infusion-okay to stop octreotide infusion today-change PPI to daily dosing.. Remains on Rocephin for SBP prophylaxis-await input from gastroenterology.   Abdominal pain: Abdominal exam is benign-she appears slightly more distended today compared to the past few days-check limited abdominal ultrasound to see if patient has ascites that can be tapped.  Coagulopathy/thrombocytopenia: Likely secondary to alcoholic hepatitis/liver cirrhosis.  Supportive care.  Alcohol withdrawal: Suspect encephalopathy is from hepatic etiology rather than alcohol withdrawal. -Per patient (confirmed with spouse) her last drink was approximately 7-10 days prior to hospitalization. She appears to be out of the window for any sort of alcohol withdrawal at this point. Counseled regarding abstinence from alcohol on 4/18.  Menorrhagia: Per patient she has been on her menstrual period for approximately 2 weeks now-apparently this has occurred in the past as well-and she has made an appointment with a GYN MD at [place]-this history was confirmed with the patient's spouse over the phone.  Pregnancy test on admission was negative.  Pelvic/transvaginal ultrasound without any major abnormalities.  AKI : Improved-likely supportive care-remains on midodrine as blood pressure remains soft.  Follow periodically-avoid nephrotoxic agents.  Hypokalemia: Repleted  Diet: Diet Order            Diet regular Room service appropriate? No; Fluid consistency: Thin  Diet effective now              Code Status: Full code  Family Communication: Spouse over the phone on 4/19  Disposition Plan: Remain  inpatient-not stable for discharge-we will probably require home health  services when ready  Barriers to Discharge: Hepatic encephalopathy, severe blood loss anemia-suspected upper GI bleeding-ongoing alcoholic hepatitis.   Antimicrobial agents: Anti-infectives (From admission, onward)   Start     Dose/Rate Route Frequency Ordered Stop   12/24/19 0906  cefTRIAXone (ROCEPHIN) 2 g in sodium chloride 0.9 % 100 mL IVPB     2 g 200 mL/hr over 30 Minutes Intravenous Every 24 hours 12/24/19 0902     12/24/19 0415  cefTRIAXone (ROCEPHIN) 2 g in sodium chloride 0.9 % 100 mL IVPB     2 g 200 mL/hr over 30 Minutes Intravenous  Once 12/24/19 0408 12/24/19 0706       Time spent: 25 minutes-Greater than 50% of this time was spent in counseling, explanation of diagnosis, planning of further management, and coordination of care.  MEDICATIONS: Scheduled Meds: . folic acid  1 mg Oral Daily  . lactulose  30 g Oral Q6H  . melatonin  3 mg Oral QHS  . midodrine  5 mg Oral TID WC  . multivitamin with minerals  1 tablet Oral Daily  . prednisoLONE  40 mg Oral Daily  . sodium chloride flush  3 mL Intravenous Q12H  . thiamine injection  100 mg Intravenous Daily   Continuous Infusions: . cefTRIAXone (ROCEPHIN)  IV 2 g (12/28/19 0810)  . octreotide  (SANDOSTATIN)    IV infusion 50 mcg/hr (12/28/19 1005)   PRN Meds:.ondansetron **OR** ondansetron (ZOFRAN) IV, sodium chloride flush   PHYSICAL EXAM: Vital signs: Vitals:   12/28/19 0721 12/28/19 0800 12/28/19 1152 12/28/19 1153  BP: 108/67 90/63  100/69  Pulse: 82 81  77  Resp: (!) 21 20  18   Temp: 97.8 F (36.6 C)  97.7 F (36.5 C)   TempSrc: Oral  Oral   SpO2: 97% 96% 95%   Weight:       Filed Weights   12/26/19 0357 12/27/19 1145  Weight: 62.8 kg 62.8 kg   Body mass index is 23.76 kg/m.   Gen Exam:Awake-slow-more confused HEENT:atraumatic, normocephalic Chest: B/L clear to auscultation anteriorly CVS:S1S2 regular Abdomen:soft non tender, non distended Extremities:no edema Neurology: Non focal Skin:  no rash  I have personally reviewed following labs and imaging studies  LABORATORY DATA: CBC: Recent Labs  Lab 12/25/19 1405 12/26/19 1044 12/26/19 2233 12/27/19 0352 12/28/19 0424  WBC 20.2* 21.6* 19.0* 17.1* 21.5*  HGB 7.8* 6.0* 9.5* 9.0* 9.2*  HCT 25.4* 19.4* 28.9* 27.8* 28.2*  MCV 121.5* 121.3* 108.2* 107.3* 106.0*  PLT 91* 64* 59* 54* 67*    Basic Metabolic Panel: Recent Labs  Lab 12/24/19 0130 12/24/19 0130 12/24/19 0919 12/25/19 0415 12/26/19 1044 12/27/19 0352 12/28/19 0424  NA 130*   < > 135 132* 130* 135 133*  K 2.3*   < > 2.9* 3.7 4.1 3.2* 3.5  CL 89*   < > 96* 104 104 109 105  CO2 17*   < > 23 20* 17* 17* 18*  GLUCOSE 126*   < > 105* 92 106* 115* 107*  BUN <5*   < > <5* <5* <5* <5* <5*  CREATININE 1.10*   < > 0.84 0.75 1.48* 1.20* 1.06*  CALCIUM 8.5*   < > 8.3* 7.5* 7.5* 7.6* 8.1*  MG 1.8  --   --   --  2.0  --  2.1  PHOS 3.7  --  3.3  --   --   --   --    < > =  values in this interval not displayed.    GFR: Estimated Creatinine Clearance: 60.9 mL/min (A) (by C-G formula based on SCr of 1.06 mg/dL (H)).  Liver Function Tests: Recent Labs  Lab 12/24/19 0919 12/25/19 0415 12/26/19 1044 12/27/19 0352 12/28/19 0424  AST 166* 152* 138* 131* 128*  ALT 33 26 26 27 25   ALKPHOS 183* 163* 141* 149* 164*  BILITOT 24.3* 21.8* 20.0* 20.8* 20.5*  PROT 8.2* 7.3 6.6 6.6 6.8  ALBUMIN 1.5* 1.3* 1.1* 1.1* 1.1*   Recent Labs  Lab 12/24/19 0130  LIPASE 18   Recent Labs  Lab 12/24/19 0312 12/26/19 1044 12/27/19 0352 12/28/19 0424  AMMONIA 137* 92* 77* 142*    Coagulation Profile: Recent Labs  Lab 12/24/19 0130 12/24/19 0919 12/25/19 0415 12/27/19 0352 12/28/19 0424  INR 2.4* 2.6* 2.3* 3.4* 2.6*    Cardiac Enzymes: No results for input(s): CKTOTAL, CKMB, CKMBINDEX, TROPONINI in the last 168 hours.  BNP (last 3 results) No results for input(s): PROBNP in the last 8760 hours.  Lipid Profile: No results for input(s): CHOL, HDL, LDLCALC,  TRIG, CHOLHDL, LDLDIRECT in the last 72 hours.  Thyroid Function Tests: No results for input(s): TSH, T4TOTAL, FREET4, T3FREE, THYROIDAB in the last 72 hours.  Anemia Panel: Recent Labs    12/25/19 1405  VITAMINB12 4,128*  FOLATE 21.3  FERRITIN 234  TIBC 133*  IRON 74  RETICCTPCT 6.2*    Urine analysis:    Component Value Date/Time   COLORURINE AMBER (A) 12/26/2019 0920   APPEARANCEUR CLOUDY (A) 12/26/2019 0920   LABSPEC 1.019 12/26/2019 0920   PHURINE 5.0 12/26/2019 0920   GLUCOSEU NEGATIVE 12/26/2019 0920   HGBUR NEGATIVE 12/26/2019 0920   BILIRUBINUR MODERATE (A) 12/26/2019 0920   KETONESUR 5 (A) 12/26/2019 0920   PROTEINUR 30 (A) 12/26/2019 0920   NITRITE NEGATIVE 12/26/2019 0920   LEUKOCYTESUR NEGATIVE 12/26/2019 0920    Sepsis Labs: Lactic Acid, Venous    Component Value Date/Time   LATICACIDVEN 2.0 (HH) 11/21/2018 2319    MICROBIOLOGY: Recent Results (from the past 240 hour(s))  Respiratory Panel by RT PCR (Flu A&B, Covid) - Nasopharyngeal Swab     Status: None   Collection Time: 12/24/19  3:04 AM   Specimen: Nasopharyngeal Swab  Result Value Ref Range Status   SARS Coronavirus 2 by RT PCR NEGATIVE NEGATIVE Final    Comment: (NOTE) SARS-CoV-2 target nucleic acids are NOT DETECTED. The SARS-CoV-2 RNA is generally detectable in upper respiratoy specimens during the acute phase of infection. The lowest concentration of SARS-CoV-2 viral copies this assay can detect is 131 copies/mL. A negative result does not preclude SARS-Cov-2 infection and should not be used as the sole basis for treatment or other patient management decisions. A negative result may occur with  improper specimen collection/handling, submission of specimen other than nasopharyngeal swab, presence of viral mutation(s) within the areas targeted by this assay, and inadequate number of viral copies (<131 copies/mL). A negative result must be combined with clinical observations, patient  history, and epidemiological information. The expected result is Negative. Fact Sheet for Patients:  PinkCheek.be Fact Sheet for Healthcare Providers:  GravelBags.it This test is not yet ap proved or cleared by the [place] FDA and  has been authorized for detection and/or diagnosis of SARS-CoV-2 by FDA under an Emergency Use Authorization (EUA). This EUA will remain  in effect (meaning this test can be used) for the duration of the COVID-19 declaration under Section 564(b)(1) of the Act, 21 U.S.C. section  360bbb-3(b)(1), unless the authorization is terminated or revoked sooner.    Influenza A by PCR NEGATIVE NEGATIVE Final   Influenza B by PCR NEGATIVE NEGATIVE Final    Comment: (NOTE) The Xpert Xpress SARS-CoV-2/FLU/RSV assay is intended as an aid in  the diagnosis of influenza from Nasopharyngeal swab specimens and  should not be used as a sole basis for treatment. Nasal washings and  aspirates are unacceptable for Xpert Xpress SARS-CoV-2/FLU/RSV  testing. Fact Sheet for Patients: PinkCheek.be Fact Sheet for Healthcare Providers: GravelBags.it This test is not yet approved or cleared by the [place] FDA and  has been authorized for detection and/or diagnosis of SARS-CoV-2 by  FDA under an Emergency Use Authorization (EUA). This EUA will remain  in effect (meaning this test can be used) for the duration of the  Covid-19 declaration under Section 564(b)(1) of the Act, 21  U.S.C. section 360bbb-3(b)(1), unless the authorization is  terminated or revoked. Performed at [facility], [place] [address], [place], [place] [address]   SARS Coronavirus 2 by RT PCR     Status: None   Collection Time: 12/24/19  3:04 AM  Result Value Ref Range Status   SARS Coronavirus 2 NEGATIVE NEGATIVE Final    Comment: (NOTE) Result indicates the ABSENCE of SARS-CoV-2 RNA in  the patient specimen.  The lowest concentration of SARS-CoV-2 viral copies this assay can detect in nasopharyngeal swab specimens is 500 copies / mL.  A negative result does not preclude SARS-CoV-2 infection and should not be used as the sole basis for patient management decisions. A negative result may occur with improper specimen collection / handling, submission of a specimen other than nasopharyngeal swab, presence of viral mutation(s) within the areas targeted by this assay, and inadequate number of viral copies (<500 copies / mL) present.  Negative results must be combined with clinical observations, patient history, and epidemiological information.  The expected result is NEGATIVE.  Patient Fact Sheet:  BlogSelections.co.uk   Provider Fact Sheet:  https://lucas.com/   This test is not yet approved or cleared by the [place] FDA and  has been authorized for  detection and/or diagnosis of SARS-CoV-2 by FDA under an Emergency Use Authorization (EUA).  This EUA will remain in effect (meaning this test can be used) for the duration of  the COVID-19 declaration under Section 564(b)(1) of the Act, 21 U.S.C. section 360bbb-3(b)(1), unless the authorization is terminated or revoked sooner Performed at [facility], [place] [address] [place], [place] [address]   Culture, blood (routine x 2)     Status: None (Preliminary result)   Collection Time: 12/26/19  3:08 PM   Specimen: BLOOD RIGHT HAND  Result Value Ref Range Status   Specimen Description BLOOD RIGHT HAND  Final   Special Requests   Final    BOTTLES DRAWN AEROBIC AND ANAEROBIC Blood Culture adequate volume   Culture   Final    NO GROWTH 2 DAYS Performed at [facility], [place] [address] [place], [place] [address]    Report Status PENDING  Incomplete  Culture, blood (routine x 2)     Status: None (Preliminary result)   Collection Time: 12/26/19  3:17 PM   Specimen:  BLOOD RIGHT WRIST  Result Value Ref Range Status   Specimen Description BLOOD RIGHT WRIST  Final   Special Requests   Final    BOTTLES DRAWN AEROBIC AND ANAEROBIC Blood Culture adequate volume   Culture   Final  NO GROWTH 2 DAYS Performed at [facility], [place] [address], [place], [place] [address]    Report Status PENDING  Incomplete    RADIOLOGY STUDIES/RESULTS: US PELVIC COMPLETE WITH TRANSVAGINAL  Result Date: 12/26/2019 CLINICAL DATA:  Menorrhagia.  Low hemoglobin. EXAM: TRANSABDOMINAL AND TRANSVAGINAL ULTRASOUND OF PELVIS TECHNIQUE: Both transabdominal and transvaginal ultrasound examinations of the pelvis were performed. Transabdominal technique was performed for global imaging of the pelvis including uterus, ovaries, adnexal regions, and pelvic cul-de-sac. It was necessary to proceed with endovaginal exam following the transabdominal exam to visualize the ovaries and better visualize the uterus and endometrium. COMPARISON:  None FINDINGS: Uterus Measurements: 7.2 x 5.0 x 3.5 cm = volume: 65 mL. No fibroids or other mass visualized. Endometrium Thickness: 7 mm.  Poorly visualized.  No focal abnormality seen. Right ovary Not visualized. Left ovary Not visualized. Other findings Small amount of free peritoneal fluid. IMPRESSION: 1. Normal appearing uterus with the exception of poor visualization of the endometrium. 2. Small amount of free peritoneal fluid. 3. Nonvisualized ovaries. Electronically Signed   By: Claudie Revering M.D.   On: 12/26/2019 19:24     LOS: 4 days   Oren Binet, MD  [facility]    To contact the attending provider between 7A-7P or the covering provider during after hours 7P-7A, please log into the web site [email or web address] and access using universal [place] password for that web site. If you do not have the password, please call the hospital operator.  12/28/2019, 2:00 PM

## 2019-12-28 NOTE — Progress Notes (Addendum)
CSW continuing to follow for ETOH use consult. Patient is still documented as disoriented and would not currently benefit from resource discussion.   Debbi Strandberg LCSW

## 2019-12-29 DIAGNOSIS — F10239 Alcohol dependence with withdrawal, unspecified: Secondary | ICD-10-CM | POA: Diagnosis not present

## 2019-12-29 DIAGNOSIS — K7010 Alcoholic hepatitis without ascites: Secondary | ICD-10-CM | POA: Diagnosis not present

## 2019-12-29 DIAGNOSIS — D649 Anemia, unspecified: Secondary | ICD-10-CM | POA: Diagnosis not present

## 2019-12-29 LAB — BASIC METABOLIC PANEL
Anion gap: 7 (ref 5–15)
BUN: <5 mg/dL — ABNORMAL LOW (ref 6–20)
CO2: 20 mmol/L — ABNORMAL LOW (ref 22–32)
Calcium: 8.1 mg/dL — ABNORMAL LOW (ref 8.9–10.3)
Chloride: 109 mmol/L (ref 98–111)
Creatinine, Ser: 1.08 mg/dL — ABNORMAL HIGH (ref 0.44–1.00)
GFR calc Af Amer: >60 mL/min (ref >60)
GFR calc non Af Amer: >60 mL/min (ref >60)
Glucose, Bld: 103 mg/dL — ABNORMAL HIGH (ref 70–99)
Potassium: 3.7 mmol/L (ref 3.5–5.1)
Sodium: 136 mmol/L (ref 135–145)

## 2019-12-29 LAB — PROTIME-INR
INR: 1.9 — ABNORMAL HIGH (ref 0.8–1.2)
Prothrombin Time: 21.8 seconds — ABNORMAL HIGH (ref 11.4–15.2)

## 2019-12-29 LAB — CBC
HCT: 26.2 % — ABNORMAL LOW (ref 36.0–46.0)
Hemoglobin: 8.2 g/dL — ABNORMAL LOW (ref 12.0–15.0)
MCH: 33.9 pg (ref 26.0–34.0)
MCHC: 31.3 g/dL (ref 30.0–36.0)
MCV: 108.3 fL — ABNORMAL HIGH (ref 80.0–100.0)
Platelets: 55 10*3/uL — ABNORMAL LOW (ref 150–400)
RBC: 2.42 MIL/uL — ABNORMAL LOW (ref 3.87–5.11)
RDW: 26.4 % — ABNORMAL HIGH (ref 11.5–15.5)
WBC: 19.7 10*3/uL — ABNORMAL HIGH (ref 4.0–10.5)
nRBC: 2.7 % — ABNORMAL HIGH (ref 0.0–0.2)

## 2019-12-29 LAB — HEPATIC FUNCTION PANEL
ALT: 25 U/L (ref 0–44)
AST: 118 U/L — ABNORMAL HIGH (ref 15–41)
Albumin: 1.1 g/dL — ABNORMAL LOW (ref 3.5–5.0)
Alkaline Phosphatase: 164 U/L — ABNORMAL HIGH (ref 38–126)
Bilirubin, Direct: 9.9 mg/dL — ABNORMAL HIGH (ref 0.0–0.2)
Indirect Bilirubin: 8.2 mg/dL — ABNORMAL HIGH (ref 0.3–0.9)
Total Bilirubin: 18.1 mg/dL (ref 0.3–1.2)
Total Protein: 6.3 g/dL — ABNORMAL LOW (ref 6.5–8.1)

## 2019-12-29 LAB — AMMONIA: Ammonia: 158 umol/L — ABNORMAL HIGH (ref 9–35)

## 2019-12-29 MED ORDER — ZINC SULFATE 220 (50 ZN) MG PO CAPS
220.0000 mg | ORAL_CAPSULE | Freq: Every day | ORAL | Status: DC
  Administered 2019-12-29 – 2019-12-30 (×2): 220 mg via ORAL
  Filled 2019-12-29 (×2): qty 1

## 2019-12-29 MED ORDER — LACTULOSE 10 GM/15ML PO SOLN: 45.0000 g | ORAL | Status: DC

## 2019-12-29 MED ORDER — HALOPERIDOL LACTATE 5 MG/ML IJ SOLN
2.0000 mg | Freq: Four times a day (QID) | INTRAMUSCULAR | Status: DC | PRN
  Administered 2019-12-29 – 2019-12-30 (×3): 2 mg via INTRAVENOUS
  Filled 2019-12-29 (×3): qty 1

## 2019-12-29 MED ORDER — LACTULOSE 10 GM/15ML PO SOLN
30.0000 g | ORAL | Status: DC
  Administered 2019-12-29 – 2019-12-30 (×4): 30 g via ORAL
  Filled 2019-12-29 (×4): qty 45

## 2019-12-29 MED ORDER — LACTULOSE ENEMA
300.0000 mL | ORAL | Status: AC
  Administered 2019-12-29 (×2): 300 mL via RECTAL
  Filled 2019-12-29 (×2): qty 300

## 2019-12-29 NOTE — Progress Notes (Addendum)
Daily Rounding Note  12/29/2019, 11:44 AM  LOS: 5 days   SUBJECTIVE:   Chief complaint:     Pt is taking her Lactulose tid. Now it is ordered PR q 4 hours and got enema at 10 AM today.    2 BM reported yesterday, no stool ou after enema this AM BPs tending low in 90s/60s.  HR in 80s to 90s.   OBJECTIVE:         Vital signs in last 24 hours:    Temp:  [97.5 F (36.4 C)-98.4 F (36.9 C)] 97.5 F (36.4 C) (04/20 0800) Pulse Rate:  [77-99] 91 (04/20 0600) Resp:  [17-22] 18 (04/20 0600) BP: (92-104)/(55-69) 99/64 (04/20 0600) SpO2:  [94 %-98 %] 95 % (04/20 0600) Weight:  [60.7 kg] 60.7 kg (04/20 0400) Last BM Date: 12/29/19 Filed Weights   12/26/19 0357 12/27/19 1145 12/29/19 0400  Weight: 62.8 kg 62.8 kg 60.7 kg   General: non-verbal. Verging on comatose.  Scleral icterus   Heart: RRR Chest: clear.  No labored breathing Abdomen: soft, ND.  BS hypoactive.  Tender in RUQ  Extremities: no CCE Neuro/Psych:  Not following commands or speaking.  Eyes are open.  No asterixis per sub-optimal flexing of wrist exam.  No tremors.    Intake/Output from previous day: 04/19 0701 - 04/20 0700 In: 1138.3 [P.O.:840; I.V.:198.3; IV Piggyback:100] Out: 200 [Stool:200]  Intake/Output this shift: No intake/output data recorded.  Lab Results: Recent Labs    12/27/19 0352 12/28/19 0424 12/29/19 0506  WBC 17.1* 21.5* 19.7*  HGB 9.0* 9.2* 8.2*  HCT 27.8* 28.2* 26.2*  PLT 54* 67* 55*   BMET Recent Labs    12/27/19 0352 12/28/19 0424 12/29/19 0506  NA 135 133* 136  K 3.2* 3.5 3.7  CL 109 105 109  CO2 17* 18* 20*  GLUCOSE 115* 107* 103*  BUN <5* <5* <5*  CREATININE 1.20* 1.06* 1.08*  CALCIUM 7.6* 8.1* 8.1*   LFT Recent Labs    12/27/19 0352 12/28/19 0424 12/29/19 0506  PROT 6.6 6.8 6.3*  ALBUMIN 1.1* 1.1* 1.1*  AST 131* 128* 118*  ALT 27 25 25   ALKPHOS 149* 164* 164*  BILITOT 20.8* 20.5* 18.1*  BILIDIR  --    --  9.9*  IBILI  --   --  8.2*   PT/INR Recent Labs    12/28/19 0424 12/29/19 0506  LABPROT 28.0* 21.8*  INR 2.6* 1.9*   Hepatitis Panel No results for input(s): HEPBSAG, HCVAB, HEPAIGM, HEPBIGM in the last 72 hours.  Studies/Results: US Abdomen Limited  Result Date: 12/28/2019 CLINICAL DATA:  Ascites.  History of alcohol abuse. EXAM: LIMITED ABDOMEN ULTRASOUND FOR ASCITES TECHNIQUE: Limited ultrasound survey for ascites was performed in all four abdominal quadrants. COMPARISON:  Right upper quadrant ultrasound 12/23/2019, pelvic ultrasound 12/26/2019 FINDINGS: All 4 quadrants were scanned for ascites. Small volume ascites in the right upper quadrant, trace free fluid in the other quadrants. IMPRESSION: Minimal abdominal ascites most prominent in the right upper quadrant. Electronically Signed   By: Keith Rake M.D.   On: 12/28/2019 19:47   Scheduled Meds: . folic acid  1 mg Oral Daily  . lactulose  300 mL Rectal Q4H  . midodrine  5 mg Oral TID WC  . multivitamin with minerals  1 tablet Oral Daily  . pantoprazole (PROTONIX) IV  40 mg Intravenous Q24H  . prednisoLONE  40 mg Oral Daily  . rifaximin  550 mg  Oral BID  . sodium chloride flush  3 mL Intravenous Q12H  . thiamine injection  100 mg Intravenous Daily  . zinc sulfate  220 mg Oral Daily   Continuous Infusions: . cefTRIAXone (ROCEPHIN)  IV 2 g (12/29/19 1005)   PRN Meds:.ondansetron **OR** ondansetron (ZOFRAN) IV, sodium chloride flush   ASSESMENT:   *  ETOH hepatitis, recurrent.   Prednisolone day 4 .  Midodrine in place.   Sandostatin stopped on 4/19.   AST, T bili better, alk phos stable elevated.    *    HE, mental status waxes/wanes. Ammonia rising to 158 today.   Rifaximin added to Lactulose on 4/19.  4/15 head CT: unremarkable.    *   Minimal ascites on 4/19 ultrasound.      *   Coagulopathy.  Improved.     *    Thrombocytopenia.    *   Blood streaked hematemesis PTA.  FOBT + 4/18 EGD: small,  non-bleeding esoph varices, large food residue in stomach but no blood.  Normal examined duodenum.   Protonix IV >> now po q 24 hours.    *   Macrocytic anemia.  S/p PRBC x 2.  Hgb 6 >> 9 >> 8.2.    *   Alcoholism and dependency, not in remission.    *   Leukocytosis, slightly better but still at 19.7.  No fevers.   Day 6 Rocephin but no + blood clx, U/A looked suspicious but no urine clx.     PLAN   *  Continue current measures.  If MS continues poorly, need to address her GOC.      Azucena Freed  12/29/2019, 11:44 AM Phone [phone]    Attending physician's note   I have taken an interval history, reviewed the chart and examined the patient. I agree with the Advanced Practitioner's note, impression and recommendations.   ETOH hepatitis with underlying liver cirrhosis on prednisolone Hepatic encephalopathy -waxing and waning.  No obvious source of infection.  Ultrasound shows minimal ascites-likely too little to tap. On Rocephin. NH3 increased to 158 Alcohol withdrawal  Plan: -Lactulose enemas/rifaximin/zinc -If not better by a.m., NG tube and give lactulose through the NG. -Ultrasound guided diagnostic paracentesis if not better by a.m. -Trend CBC, CMP, ammonia and PT.  Carmell Austria, MD Velora Heckler Fabienne Bruns [phone].

## 2019-12-29 NOTE — Plan of Care (Signed)

## 2019-12-29 NOTE — Progress Notes (Addendum)
PROGRESS NOTE        PATIENT DETAILS Name: Joann [name] Age: 39 y.o. Sex: female Date of Birth: 24-Dec-1980 Admit Date: 12/23/2019 Admitting Physician Karmen Bongo, MD ONG:[id number], Judson Roch, PA-C  Brief Narrative: Patient is a 39 y.o. female history of alcoholic hepatitis, probable liver cirrhosis-longstanding history of alcohol abuse-presenting with confusion-found to have acute hepatic encephalopathy, possible acute blood loss anemia related to upper GI bleeding.  Subsequently admitted to the hospitalist service-see below for further details.  Significant events: 4/14>> presented to the ED with confusion-found to have hepatic encephalopathy  Antimicrobial therapy: Rocephin: 4/14>>  Microbiology data: 4/17: Blood cultures>> negative  Procedures : None  Consults: GI  DVT Prophylaxis : SCD's  Subjective: Remains encephalopathic-not much change from yesterday-briefly awakes-and then goes back to sleep.  Per nursing staff-patient had at least 2-3 BMs yesterday.  No BM today apart from earlier this morning around 3 AM  Assessment/Plan: Hepatic encephalopathy: Continues to be encephalopathic-not much improvement since yesterday.  Ammonia levels increasing.  Will give lactulose enema x2-already on rifaximin-on low protein diet. Will change oral lactulose to every 4 hours. Do not see any particular provoking factors for hepatic encephalopathy at this point-limited abdominal ultrasound without any significant ascites to suggest SBP-although her hemoglobin is dropped slightly today but there is no evidence of GI bleeding overtly. Will await formal evaluation by GI attending.  Alcoholic hepatitis: Remains icteric-LFTs/bilirubin slowly downtrending.  Remains on steroids-started on 4/17.  UA/chest x-ray without any obvious infection.  Blood cultures negative so far.    Suspected upper GI bleeding and acute blood loss anemia: Bloody stools noted on rectal exam in  the emergency room-but no overt bleeding.  Poor/unreliable historian-did acknowledge that she may have had hematemesis/hemoptysis a few days prior to this hospitalization-hemoglobin did drop to 6.0 on 4/17-subsequently given 2 units of PRBC-hemoglobin slowly downtrending-back down to 8.2-but no overt bleeding noted-plans are to continue to follow closely.  Plans are to follow. Continue to follow closely. EGD did not show any other abnormalities apart from grade 2 varices.  No longer on PPI/ octreotide infusion-on daily PPI dosing. Remains on Rocephin for SBP prophylaxis-await input from gastroenterology.   Abdominal pain: Abdominal exam is benign-she appears slightly more distended today compared to the past few days-check limited abdominal ultrasound to see if patient has ascites that can be tapped.  Coagulopathy/thrombocytopenia: Likely secondary to alcoholic hepatitis/liver cirrhosis.  Supportive care.  Alcohol withdrawal: Suspect encephalopathy is from hepatic etiology rather than alcohol withdrawal. -Per patient (confirmed with spouse) her last drink was approximately 7-10 days prior to hospitalization. She appears to be out of the window for any sort of alcohol withdrawal at this point. Counseled regarding abstinence from alcohol on 4/18.  Menorrhagia: Per patient she has been on her menstrual period for approximately 2 weeks now-apparently this has occurred in the past as well-and she has made an appointment with a GYN MD at [place]-this history was confirmed with the patient's spouse over the phone.  Pregnancy test on admission was negative.  Pelvic/transvaginal ultrasound without any major abnormalities.  AKI : Improved-likely supportive care-remains on midodrine as blood pressure remains soft.  Follow periodically-avoid nephrotoxic agents.  Hypokalemia: Repleted  Diet: Diet Order            Diet regular Room service appropriate? No; Fluid consistency: Thin  Diet effective now  Code Status: Full code  Family Communication: Spouse over the phone on 4/19  Disposition Plan: Remain inpatient-not stable for discharge-we will probably require home health services when ready  Barriers to Discharge: Hepatic encephalopathy, severe blood loss anemia-suspected upper GI bleeding-ongoing alcoholic hepatitis.   Antimicrobial agents: Anti-infectives (From admission, onward)   Start     Dose/Rate Route Frequency Ordered Stop   12/28/19 1445  rifaximin (XIFAXAN) tablet 550 mg     550 mg Oral 2 times daily 12/28/19 1410     12/24/19 0906  cefTRIAXone (ROCEPHIN) 2 g in sodium chloride 0.9 % 100 mL IVPB     2 g 200 mL/hr over 30 Minutes Intravenous Every 24 hours 12/24/19 0902     12/24/19 0415  cefTRIAXone (ROCEPHIN) 2 g in sodium chloride 0.9 % 100 mL IVPB     2 g 200 mL/hr over 30 Minutes Intravenous  Once 12/24/19 0408 12/24/19 0706       Time spent: 25 minutes-Greater than 50% of this time was spent in counseling, explanation of diagnosis, planning of further management, and coordination of care.  MEDICATIONS: Scheduled Meds: . folic acid  1 mg Oral Daily  . lactulose  300 mL Rectal Q4H  . midodrine  5 mg Oral TID WC  . multivitamin with minerals  1 tablet Oral Daily  . pantoprazole (PROTONIX) IV  40 mg Intravenous Q24H  . prednisoLONE  40 mg Oral Daily  . rifaximin  550 mg Oral BID  . sodium chloride flush  3 mL Intravenous Q12H  . thiamine injection  100 mg Intravenous Daily  . zinc sulfate  220 mg Oral Daily   Continuous Infusions: . cefTRIAXone (ROCEPHIN)  IV 2 g (12/29/19 1005)   PRN Meds:.ondansetron **OR** ondansetron (ZOFRAN) IV, sodium chloride flush   PHYSICAL EXAM: Vital signs: Vitals:   12/29/19 0500 12/29/19 0600 12/29/19 0800 12/29/19 1200  BP:  99/64    Pulse:  91    Resp: 19 18    Temp:   (!) 97.5 F (36.4 C) (!) 97.4 F (36.3 C)  TempSrc:   Axillary Axillary  SpO2:  95%    Weight:       Filed Weights   12/26/19 0357  12/27/19 1145 12/29/19 0400  Weight: 62.8 kg 62.8 kg 60.7 kg   Body mass index is 22.97 kg/m.   Gen Exam: Awakes-but sleepy/lethargic-remains encephalopathic. HEENT:atraumatic, normocephalic Chest: B/L clear to auscultation anteriorly CVS:S1S2 regular Abdomen:soft non tender, non distended Extremities:no edema Neurology: Difficult exam but appears nonfocal-moves all 4 extremities Skin: no rash  I have personally reviewed following labs and imaging studies  LABORATORY DATA: CBC: Recent Labs  Lab 12/26/19 1044 12/26/19 2233 12/27/19 0352 12/28/19 0424 12/29/19 0506  WBC 21.6* 19.0* 17.1* 21.5* 19.7*  HGB 6.0* 9.5* 9.0* 9.2* 8.2*  HCT 19.4* 28.9* 27.8* 28.2* 26.2*  MCV 121.3* 108.2* 107.3* 106.0* 108.3*  PLT 64* 59* 54* 67* 55*    Basic Metabolic Panel: Recent Labs  Lab 12/24/19 0130 12/24/19 0130 12/24/19 0919 12/24/19 0919 12/25/19 0415 12/26/19 1044 12/27/19 0352 12/28/19 0424 12/29/19 0506  NA 130*   < > 135   < > 132* 130* 135 133* 136  K 2.3*   < > 2.9*   < > 3.7 4.1 3.2* 3.5 3.7  CL 89*   < > 96*   < > 104 104 109 105 109  CO2 17*   < > 23   < > 20* 17* 17* 18* 20*  GLUCOSE 126*   < >  105*   < > 92 106* 115* 107* 103*  BUN <5*   < > <5*   < > <5* <5* <5* <5* <5*  CREATININE 1.10*   < > 0.84   < > 0.75 1.48* 1.20* 1.06* 1.08*  CALCIUM 8.5*   < > 8.3*   < > 7.5* 7.5* 7.6* 8.1* 8.1*  MG 1.8  --   --   --   --  2.0  --  2.1  --   PHOS 3.7  --  3.3  --   --   --   --   --   --    < > = values in this interval not displayed.    GFR: Estimated Creatinine Clearance: 59.8 mL/min (A) (by C-G formula based on SCr of 1.08 mg/dL (H)).  Liver Function Tests: Recent Labs  Lab 12/25/19 0415 12/26/19 1044 12/27/19 0352 12/28/19 0424 12/29/19 0506  AST 152* 138* 131* 128* 118*  ALT 26 26 27 25 25   ALKPHOS 163* 141* 149* 164* 164*  BILITOT 21.8* 20.0* 20.8* 20.5* 18.1*  PROT 7.3 6.6 6.6 6.8 6.3*  ALBUMIN 1.3* 1.1* 1.1* 1.1* 1.1*   Recent Labs  Lab  12/24/19 0130  LIPASE 18   Recent Labs  Lab 12/24/19 0312 12/26/19 1044 12/27/19 0352 12/28/19 0424 12/29/19 0506  AMMONIA 137* 92* 77* 142* 158*    Coagulation Profile: Recent Labs  Lab 12/24/19 0919 12/25/19 0415 12/27/19 0352 12/28/19 0424 12/29/19 0506  INR 2.6* 2.3* 3.4* 2.6* 1.9*    Cardiac Enzymes: No results for input(s): CKTOTAL, CKMB, CKMBINDEX, TROPONINI in the last 168 hours.  BNP (last 3 results) No results for input(s): PROBNP in the last 8760 hours.  Lipid Profile: No results for input(s): CHOL, HDL, LDLCALC, TRIG, CHOLHDL, LDLDIRECT in the last 72 hours.  Thyroid Function Tests: No results for input(s): TSH, T4TOTAL, FREET4, T3FREE, THYROIDAB in the last 72 hours.  Anemia Panel: No results for input(s): VITAMINB12, FOLATE, FERRITIN, TIBC, IRON, RETICCTPCT in the last 72 hours.  Urine analysis:    Component Value Date/Time   COLORURINE AMBER (A) 12/26/2019 0920   APPEARANCEUR CLOUDY (A) 12/26/2019 0920   LABSPEC 1.019 12/26/2019 0920   PHURINE 5.0 12/26/2019 0920   GLUCOSEU NEGATIVE 12/26/2019 0920   HGBUR NEGATIVE 12/26/2019 0920   BILIRUBINUR MODERATE (A) 12/26/2019 0920   KETONESUR 5 (A) 12/26/2019 0920   PROTEINUR 30 (A) 12/26/2019 0920   NITRITE NEGATIVE 12/26/2019 0920   LEUKOCYTESUR NEGATIVE 12/26/2019 0920    Sepsis Labs: Lactic Acid, Venous    Component Value Date/Time   LATICACIDVEN 2.0 ([place]) 11/21/2018 2319    MICROBIOLOGY: Recent Results (from the past 240 hour(s))  Respiratory Panel by RT PCR (Flu A&B, Covid) - Nasopharyngeal Swab     Status: None   Collection Time: 12/24/19  3:04 AM   Specimen: Nasopharyngeal Swab  Result Value Ref Range Status   SARS Coronavirus 2 by RT PCR NEGATIVE NEGATIVE Final    Comment: (NOTE) SARS-CoV-2 target nucleic acids are NOT DETECTED. The SARS-CoV-2 RNA is generally detectable in upper respiratoy specimens during the acute phase of infection. The lowest concentration of SARS-CoV-2  viral copies this assay can detect is 131 copies/mL. A negative result does not preclude SARS-Cov-2 infection and should not be used as the sole basis for treatment or other patient management decisions. A negative result may occur with  improper specimen collection/handling, submission of specimen other than nasopharyngeal swab, presence of viral mutation(s) within the areas targeted  by this assay, and inadequate number of viral copies (<131 copies/mL). A negative result must be combined with clinical observations, patient history, and epidemiological information. The expected result is Negative. Fact Sheet for Patients:  PinkCheek.be Fact Sheet for Healthcare Providers:  GravelBags.it This test is not yet ap proved or cleared by the [place] FDA and  has been authorized for detection and/or diagnosis of SARS-CoV-2 by FDA under an Emergency Use Authorization (EUA). This EUA will remain  in effect (meaning this test can be used) for the duration of the COVID-19 declaration under Section 564(b)(1) of the Act, 21 U.S.C. section 360bbb-3(b)(1), unless the authorization is terminated or revoked sooner.    Influenza A by PCR NEGATIVE NEGATIVE Final   Influenza B by PCR NEGATIVE NEGATIVE Final    Comment: (NOTE) The Xpert Xpress SARS-CoV-2/FLU/RSV assay is intended as an aid in  the diagnosis of influenza from Nasopharyngeal swab specimens and  should not be used as a sole basis for treatment. Nasal washings and  aspirates are unacceptable for Xpert Xpress SARS-CoV-2/FLU/RSV  testing. Fact Sheet for Patients: PinkCheek.be Fact Sheet for Healthcare Providers: GravelBags.it This test is not yet approved or cleared by the [place] FDA and  has been authorized for detection and/or diagnosis of SARS-CoV-2 by  FDA under an Emergency Use Authorization (EUA). This EUA will  remain  in effect (meaning this test can be used) for the duration of the  Covid-19 declaration under Section 564(b)(1) of the Act, 21  U.S.C. section 360bbb-3(b)(1), unless the authorization is  terminated or revoked. Performed at [facility], [place] [address], [place], [place] [address]   SARS Coronavirus 2 by RT PCR     Status: None   Collection Time: 12/24/19  3:04 AM  Result Value Ref Range Status   SARS Coronavirus 2 NEGATIVE NEGATIVE Final    Comment: (NOTE) Result indicates the ABSENCE of SARS-CoV-2 RNA in the patient specimen.  The lowest concentration of SARS-CoV-2 viral copies this assay can detect in nasopharyngeal swab specimens is 500 copies / mL.  A negative result does not preclude SARS-CoV-2 infection and should not be used as the sole basis for patient management decisions. A negative result may occur with improper specimen collection / handling, submission of a specimen other than nasopharyngeal swab, presence of viral mutation(s) within the areas targeted by this assay, and inadequate number of viral copies (<500 copies / mL) present.  Negative results must be combined with clinical observations, patient history, and epidemiological information.  The expected result is NEGATIVE.  Patient Fact Sheet:  BlogSelections.co.uk   Provider Fact Sheet:  https://lucas.com/   This test is not yet approved or cleared by the [place] FDA and  has been authorized for  detection and/or diagnosis of SARS-CoV-2 by FDA under an Emergency Use Authorization (EUA).  This EUA will remain in effect (meaning this test can be used) for the duration of  the COVID-19 declaration under Section 564(b)(1) of the Act, 21 U.S.C. section 360bbb-3(b)(1), unless the authorization is terminated or revoked sooner Performed at [facility], [place] [address], [place], [place] [address]   Culture, blood (routine x 2)     Status: None  (Preliminary result)   Collection Time: 12/26/19  3:08 PM   Specimen: BLOOD RIGHT HAND  Result Value Ref Range Status   Specimen Description BLOOD RIGHT HAND  Final   Special Requests   Final    BOTTLES DRAWN AEROBIC AND ANAEROBIC Blood Culture adequate  volume   Culture   Final    NO GROWTH 3 DAYS Performed at [facility], [place] [address], [place], [place] [address]    Report Status PENDING  Incomplete  Culture, blood (routine x 2)     Status: None (Preliminary result)   Collection Time: 12/26/19  3:17 PM   Specimen: BLOOD RIGHT WRIST  Result Value Ref Range Status   Specimen Description BLOOD RIGHT WRIST  Final   Special Requests   Final    BOTTLES DRAWN AEROBIC AND ANAEROBIC Blood Culture adequate volume   Culture   Final    NO GROWTH 3 DAYS Performed at [facility], [place] [address], [place], [place] [address]    Report Status PENDING  Incomplete    RADIOLOGY STUDIES/RESULTS: US Abdomen Limited  Result Date: 12/28/2019 CLINICAL DATA:  Ascites.  History of alcohol abuse. EXAM: LIMITED ABDOMEN ULTRASOUND FOR ASCITES TECHNIQUE: Limited ultrasound survey for ascites was performed in all four abdominal quadrants. COMPARISON:  Right upper quadrant ultrasound 12/23/2019, pelvic ultrasound 12/26/2019 FINDINGS: All 4 quadrants were scanned for ascites. Small volume ascites in the right upper quadrant, trace free fluid in the other quadrants. IMPRESSION: Minimal abdominal ascites most prominent in the right upper quadrant. Electronically Signed   By: Keith Rake M.D.   On: 12/28/2019 19:47     LOS: 5 days   Oren Binet, MD  [facility]    To contact the attending provider between 7A-7P or the covering provider during after hours 7P-7A, please log into the web site [email or web address] and access using universal [place] password for that web site. If you do not have the password, please call the hospital operator.  12/29/2019, 2:14 PM

## 2019-12-30 ENCOUNTER — Inpatient Hospital Stay (HOSPITAL_COMMUNITY): Payer: Medicaid Other

## 2019-12-30 DIAGNOSIS — K7010 Alcoholic hepatitis without ascites: Secondary | ICD-10-CM | POA: Diagnosis not present

## 2019-12-30 DIAGNOSIS — D696 Thrombocytopenia, unspecified: Secondary | ICD-10-CM | POA: Diagnosis not present

## 2019-12-30 DIAGNOSIS — K7290 Hepatic failure, unspecified without coma: Secondary | ICD-10-CM | POA: Diagnosis not present

## 2019-12-30 DIAGNOSIS — F10239 Alcohol dependence with withdrawal, unspecified: Secondary | ICD-10-CM | POA: Diagnosis not present

## 2019-12-30 LAB — COMPREHENSIVE METABOLIC PANEL
ALT: 32 U/L (ref 0–44)
AST: 193 U/L — ABNORMAL HIGH (ref 15–41)
Albumin: 1.6 g/dL — ABNORMAL LOW (ref 3.5–5.0)
Alkaline Phosphatase: 201 U/L — ABNORMAL HIGH (ref 38–126)
Anion gap: 11 (ref 5–15)
BUN: 8 mg/dL (ref 6–20)
CO2: 18 mmol/L — ABNORMAL LOW (ref 22–32)
Calcium: 8.8 mg/dL — ABNORMAL LOW (ref 8.9–10.3)
Chloride: 110 mmol/L (ref 98–111)
Creatinine, Ser: 1.09 mg/dL — ABNORMAL HIGH (ref 0.44–1.00)
GFR calc Af Amer: >60 mL/min (ref >60)
GFR calc non Af Amer: >60 mL/min (ref >60)
Glucose, Bld: 125 mg/dL — ABNORMAL HIGH (ref 70–99)
Potassium: 4.2 mmol/L (ref 3.5–5.1)
Sodium: 139 mmol/L (ref 135–145)
Total Bilirubin: 22.9 mg/dL (ref 0.3–1.2)
Total Protein: 7.9 g/dL (ref 6.5–8.1)

## 2019-12-30 LAB — BLOOD GAS, ARTERIAL
Acid-base deficit: 2.5 mmol/L — ABNORMAL HIGH (ref 0.0–2.0)
Bicarbonate: 21.5 mmol/L (ref 20.0–28.0)
Drawn by: 270221
FIO2: 21
O2 Saturation: 92.3 %
Patient temperature: 36.6
pCO2 arterial: 34.1 mmHg (ref 32.0–48.0)
pH, Arterial: 7.413 (ref 7.350–7.450)
pO2, Arterial: 63.8 mmHg — ABNORMAL LOW (ref 83.0–108.0)

## 2019-12-30 LAB — AMMONIA: Ammonia: 198 umol/L — ABNORMAL HIGH (ref 9–35)

## 2019-12-30 LAB — GLUCOSE, CAPILLARY
Glucose-Capillary: 109 mg/dL — ABNORMAL HIGH (ref 70–99)
Glucose-Capillary: 76 mg/dL (ref 70–99)

## 2019-12-30 LAB — CBC
HCT: 33.5 % — ABNORMAL LOW (ref 36.0–46.0)
Hemoglobin: 10.7 g/dL — ABNORMAL LOW (ref 12.0–15.0)
MCH: 35 pg — ABNORMAL HIGH (ref 26.0–34.0)
MCHC: 31.9 g/dL (ref 30.0–36.0)
MCV: 109.5 fL — ABNORMAL HIGH (ref 80.0–100.0)
Platelets: 57 10*3/uL — ABNORMAL LOW (ref 150–400)
RBC: 3.06 MIL/uL — ABNORMAL LOW (ref 3.87–5.11)
RDW: 26.5 % — ABNORMAL HIGH (ref 11.5–15.5)
WBC: 25.3 10*3/uL — ABNORMAL HIGH (ref 4.0–10.5)
nRBC: 2.8 % — ABNORMAL HIGH (ref 0.0–0.2)

## 2019-12-30 MED ORDER — LACTULOSE 10 GM/15ML PO SOLN
30.0000 g | ORAL | Status: DC
  Administered 2019-12-30 – 2019-12-31 (×9): 30 g via ORAL
  Filled 2019-12-30 (×8): qty 45

## 2019-12-30 MED ORDER — ALBUMIN HUMAN 25 % IV SOLN
25.0000 g | Freq: Three times a day (TID) | INTRAVENOUS | Status: DC
  Administered 2019-12-30 – 2020-01-01 (×6): 25 g via INTRAVENOUS
  Filled 2019-12-30 (×5): qty 100

## 2019-12-30 MED ORDER — LACTULOSE ENEMA
300.0000 mL | Freq: Once | ORAL | Status: AC
  Administered 2019-12-30: 300 mL via RECTAL
  Filled 2019-12-30: qty 300

## 2019-12-30 MED ORDER — PANTOPRAZOLE SODIUM 40 MG PO TBEC
40.0000 mg | DELAYED_RELEASE_TABLET | Freq: Every day | ORAL | Status: DC
  Administered 2019-12-30 – 2019-12-31 (×2): 40 mg via ORAL
  Filled 2019-12-30 (×2): qty 1

## 2019-12-30 MED ORDER — VITAMIN K1 10 MG/ML IJ SOLN
10.0000 mg | Freq: Every day | INTRAVENOUS | Status: AC
  Administered 2019-12-30 – 2020-01-01 (×3): 10 mg via INTRAVENOUS
  Filled 2019-12-30 (×4): qty 1

## 2019-12-30 MED ORDER — OSMOLITE 1.5 CAL PO LIQD
1000.0000 mL | ORAL | Status: DC
  Administered 2019-12-30: 1000 mL
  Filled 2019-12-30 (×2): qty 1000

## 2019-12-30 MED ORDER — OSMOLITE 1.2 CAL PO LIQD: 1000.0000 mL | ORAL | Status: DC

## 2019-12-30 NOTE — Progress Notes (Signed)
Rounding and shift assessment performed at this time, pt is lying with eyes opened, pt does not respond to voice, but will close eyes when touched, PERRLA., flexiseal readjusted with return of brown liquid stool, soft wrist restraints are in place, but are not secured to bed, pt currently has mittens on,  1:1 sitter in room at this time, no other needs, will CTM.

## 2019-12-30 NOTE — Procedures (Signed)
Cortrak  Person Inserting Tube:  Jacklynn Barnacle E, RD Tube Type:  Cortrak - 43 inches Tube Location:  Left nare Initial Placement:  Stomach Secured by: Bridle Technique Used to Measure Tube Placement:  Documented cm marking at nare/ corner of mouth Cortrak Secured At:  65 cm    Cortrak Tube Team Note:  Consult received to place a Cortrak feeding tube.   No x-ray is required. RN may begin using tube.    If the tube becomes dislodged please keep the tube and contact the Cortrak team at [email or web address] (password [email or web address]) for replacement.  If after hours and replacement cannot be delayed, place a NG tube and confirm placement with an abdominal x-ray.    Jacklynn Barnacle, MS, RD, LDN Pager number available on Amion

## 2019-12-30 NOTE — Progress Notes (Signed)
EEG complete - results pending 

## 2019-12-30 NOTE — Progress Notes (Addendum)
This morning-patient was confused- agitated-and attempting to get out of bed, as the day has gone along-she has become more lethargc-still awake-often stares out-barely winces to a painful stimuli-but really not following commands. No accumulation of secretions-seems to be protecting airway for now. Per RN-no BM on his shift yet. Per Sitter at bedside had 2 small BM's early in the morning.  Will give one dose of Lactulose enema-currently on Lactulose q2 hours via NGT (has received 3rd dose so far).  ABG with no hypercarbia  Will check CT head-to make sure no intracranial pathology-although I doubt  Will continue to monitor closely-if her stupor worsens-she may need to be transferred to the ICU.Will ask our night team to monitor closely.

## 2019-12-30 NOTE — Progress Notes (Addendum)
Initial Nutrition Assessment  DOCUMENTATION CODES:   Not applicable  INTERVENTION:   Initiate Osmolite 1.5 formula @ 25 ml/hr via Cortrak NGT and increase by 10 ml every 8 hours to goal rate of 55 ml/hr.   Tube feeding regimen provides 1980 kcal (100% of needs), 82 grams of protein, and 1003 ml of H2O.   Monitor magnesium, potassium, and phosphorus daily for at least 3 days, MD to replete as needed, as pt is at risk for refeeding syndrome given history of alcohol abuse.  NUTRITION DIAGNOSIS:   Increased nutrient needs related to chronic illness(cirrhosis, hepatitits) as evidenced by estimated needs.  GOAL:   Patient will meet greater than or equal to 90% of their needs  MONITOR:   TF tolerance, Skin, Weight trends, Labs, I & O's  REASON FOR ASSESSMENT:   Malnutrition Screening Tool    ASSESSMENT:   39 y.o. female history of alcoholic hepatitis, probable liver cirrhosis-longstanding history of alcohol abuse-presenting with confusion-found to have acute hepatic encephalopathy.  RD working remotely.  Pt confused and encephalopathic. Cortrak NGT placed today. Tip of tube in stomach. RD given consent via MD for tube feeding initiation and management. Noted, pt at risk for refeeding due to history of alcohol abuse. Plans to initiate with slow TF advancement and monitor potassium, phosphorous, and magnesium labs. MD to replete if values are low.   Unable to complete Nutrition-Focused physical exam at this time.   Labs and medications reviewed.  Ammonia elevated at 198.   Diet Order:   Diet Order            Diet regular Room service appropriate? No; Fluid consistency: Thin  Diet effective now              EDUCATION NEEDS:   Not appropriate for education at this time  Skin:  Skin Assessment: Reviewed RN Assessment  Last BM:  4/21  Height:   Ht Readings from Last 1 Encounters:  07/06/19 5' 4"  (1.626 m)    Weight:   Wt Readings from Last 1 Encounters:   12/29/19 60.7 kg    BMI:  Body mass index is 22.97 kg/m.  Estimated Nutritional Needs:   Kcal:  1900-2100  Protein:  75-85 grams  Fluid:  >/= 1.9 L/day    Corrin Parker, MS, RD, LDN RD pager number/after hours weekend pager number on Amion.

## 2019-12-30 NOTE — Progress Notes (Signed)
Daily Rounding Note  12/30/2019, 11:40 AM  LOS: 6 days   SUBJECTIVE:   Chief complaint: Hepatic encephalopathy.  Alcoholic hepatitis.      Core track feeding tube placed this morning.  Flexi-seal in place.  She has been biting her tongue and has had some oral bleeding from this. Remains encephalopathic, actively agitated/aggressive overnight., trying to get out of bed, kick staff.  Received 3 doses Haldol thus far and currently calm/ near comatose.  OBJECTIVE:         Vital signs in last 24 hours:    Temp:  [97.4 F (36.3 C)-98.2 F (36.8 C)] 98.2 F (36.8 C) (04/20 2344) Pulse Rate:  [80-102] 97 (04/21 0900) Resp:  [16-19] 19 (04/21 0800) BP: (93-131)/(65-96) 131/96 (04/21 0800) SpO2:  [96 %-100 %] 96 % (04/21 0800) Last BM Date: 12/30/19 Filed Weights   12/26/19 0357 12/27/19 1145 12/29/19 0400  Weight: 62.8 kg 62.8 kg 60.7 kg   General: jaundiced with icterus.  Unresponsive to noxious stimuli   Heart: RRR Chest: no labored breathing Abdomen: soft, NT, ND.  BS hypoactive.   Extremities: no CCE Neuro/Psych:  Comatose.    Intake/Output from previous day: No intake/output data recorded.  Intake/Output this shift: No intake/output data recorded.  Lab Results: Recent Labs    12/28/19 0424 12/29/19 0506 12/30/19 0308  WBC 21.5* 19.7* 25.3*  HGB 9.2* 8.2* 10.7*  HCT 28.2* 26.2* 33.5*  PLT 67* 55* 57*   BMET Recent Labs    12/28/19 0424 12/29/19 0506 12/30/19 0308  NA 133* 136 139  K 3.5 3.7 4.2  CL 105 109 110  CO2 18* 20* 18*  GLUCOSE 107* 103* 125*  BUN <5* <5* 8  CREATININE 1.06* 1.08* 1.09*  CALCIUM 8.1* 8.1* 8.8*   LFT Recent Labs    12/28/19 0424 12/29/19 0506 12/30/19 0308  PROT 6.8 6.3* 7.9  ALBUMIN 1.1* 1.1* 1.6*  AST 128* 118* 193*  ALT 25 25 32  ALKPHOS 164* 164* 201*  BILITOT 20.5* 18.1* 22.9*  BILIDIR  --  9.9*  --   IBILI  --  8.2*  --    PT/INR Recent Labs     12/28/19 0424 12/29/19 0506  LABPROT 28.0* 21.8*  INR 2.6* 1.9*   Hepatitis Panel No results for input(s): HEPBSAG, HCVAB, HEPAIGM, HEPBIGM in the last 72 hours.  Studies/Results: US Abdomen Limited  Result Date: 12/28/2019 CLINICAL DATA:  Ascites.  History of alcohol abuse. EXAM: LIMITED ABDOMEN ULTRASOUND FOR ASCITES TECHNIQUE: Limited ultrasound survey for ascites was performed in all four abdominal quadrants. COMPARISON:  Right upper quadrant ultrasound 12/23/2019, pelvic ultrasound 12/26/2019 FINDINGS: All 4 quadrants were scanned for ascites. Small volume ascites in the right upper quadrant, trace free fluid in the other quadrants. IMPRESSION: Minimal abdominal ascites most prominent in the right upper quadrant. Electronically Signed   By: Keith Rake M.D.   On: 12/28/2019 19:47   Scheduled Meds: . folic acid  1 mg Oral Daily  . lactulose  30 g Oral Q2H  . midodrine  5 mg Oral TID WC  . multivitamin with minerals  1 tablet Oral Daily  . pantoprazole  40 mg Oral Q0600  . prednisoLONE  40 mg Oral Daily  . rifaximin  550 mg Oral BID  . sodium chloride flush  3 mL Intravenous Q12H  . thiamine injection  100 mg Intravenous Daily  . zinc sulfate  220 mg Oral Daily   Continuous  Infusions: . albumin human    . cefTRIAXone (ROCEPHIN)  IV 2 g (12/30/19 0752)  . phytonadione (VITAMIN K) IV     PRN Meds:.haloperidol lactate, ondansetron **OR** ondansetron (ZOFRAN) IV, sodium chloride flush   ASSESMENT:    *  ETOH hepatitis, recurrent.   Prednisolone day 5 .  Midodrine in place.   Sandostatin stopped on 4/19.   AST, T bili better; alk phos stable elevated.  Disc fx score 85 at admission, ~ 62 today.   WBCs rising.     *    HE, mental status persistently compromised, agitation overnight. ? Late ETOH withdrawal.   Ammonia rising to 158 today.  Rifaximin added to Lactulose on 4/19, Zinc added 4/20.  Lactulose administered both per rectum as well as oral.  Currently on every 2  hour oral dosing. 4/15 head CT: unremarkable.    *   Minimal ascites on 2 separate ultrasounds.      *   Coagulopathy.  Improved.     *    Thrombocytopenia.    Platelets stable at 57.  *   Blood streaked hematemesis PTA.  FOBT + 4/18 EGD: small, non-bleeding esoph varices, large food residue in stomach but no blood.  Normal examined duodenum.   Protonix IV >> now po q 24 hours.    *   Macrocytic anemia.  S/p PRBC x 2 ON 4/17.  Hgb 10.7.   *   Alcoholism and dependency, not in remission.    *   Leukocytosis, slightly better but still at 19.7.  No fevers.   Day 6 Rocephin but no + blood clx, U/A looked suspicious but no urine clx.    PLAN   *   Given rising ammonia, and WBCs rising (though no fever) wonder if there is some yet undiagnosed source of infection? If she had UTI or SBP, this would be covered by Rocephin and no elevated WBCs or fevers.   *   Note pt's husband is a truck driver and will next visit pt on Friday.     *   Maximize lactulose with coretrack/flexi-seal now in place.  Also wonder about starting tube feeds?       Azucena Freed  12/30/2019, 11:40 AM Phone [phone]

## 2019-12-30 NOTE — Progress Notes (Addendum)
PROGRESS NOTE        PATIENT DETAILS Name: Joann [name] Age: 39 y.o. Sex: female Date of Birth: 1981-05-01 Admit Date: 12/23/2019 Admitting Physician Karmen Bongo, MD [id number], Judson Roch, PA-C  Brief Narrative: Patient is a 39 y.o. female history of alcoholic hepatitis, probable liver cirrhosis-longstanding history of alcohol abuse-presenting with confusion-found to have acute hepatic encephalopathy, possible acute blood loss anemia related to upper GI bleeding.  Subsequently admitted to the hospitalist service-see below for further details.  Significant events: 4/14>> presented to the ED with confusion-found to have hepatic encephalopathy  Antimicrobial therapy: Rocephin: 4/14>>  Microbiology data: 4/17: Blood cultures>> negative  Procedures : 4/18 >>EGD-grade 2 varices, large amount of food in the stomach  Consults: GI  DVT Prophylaxis : SCD's  Subjective: Remains encephalopathic-confused although awake-attempting to get out of bed.  In restraints this morning.  Assessment/Plan: Hepatic encephalopathy: Unfortunately continues to be encephalopathic-ammonia levels worsening this morning.  This is in spite of getting lactulose enema x2 yesterday-changing lactulose orally to every 4 hours and adding rifaximin.  No active GI bleeding or any obvious signs of infection (UA/CXR/blood cultures negative for infection) that is provoking/aggravating hepatic encephalopathy.  Abdominal ultrasound only showed minimal ascites-however already on empiric IV Rocephin even if she had SBP.  After discussion with GI MD Dr. Donivan Scull placed NG tube-we will change oral lactulose to every hours for the next few doses to see if this resolves in improvement in her mental status (she did have approximately 3-4 BMs yesterday-unfortunately none this morning).  Will await further recommendations from GI team.  Alcoholic hepatitis: Remains icteric-total bilirubin and AST levels  worsening today-this is after a few days of improvement.  She remains on empiric prednisolone.  After a few days of improvement in her bilirubin and AST levels-these have actually after a few days of LFTs/bilirubin slowly downtrending.  Remains on steroids-started on 4/17.  UA/chest x-ray without any obvious infection.  Blood cultures negative so far.    Suspected upper GI bleeding and acute blood loss anemia: Bloody stools noted on rectal exam in the emergency room-but no overt bleeding.  Poor/unreliable historian-did acknowledge that she may have had hematemesis/hemoptysis a few days prior to this hospitalization-hemoglobin did drop to 6.0 on 4/17-subsequently given 2 units of PRBC-hemoglobin stable since then. EGD did not show any other abnormalities apart from grade 2 varices.  No longer on PPI/ octreotide infusion-on daily PPI dosing. Remains on Rocephin for SBP  Abdominal pain: Abdominal exam is benign-she appears slightly more distended today compared to the past few days- limited abdominal ultrasound minimal ascites-she remains on IV Rocephin in any event.  GI MD contemplating ordering a paracentesis to see if SBP present.  After discussion with the GI MD today-have added IV albumin.  She is already on midodrine as her blood pressure was somewhat soft on admission.  Coagulopathy/thrombocytopenia: Likely secondary to alcoholic hepatitis/liver cirrhosis.  After discussion with GI MD today-have started IV vitamin K x3 days.  Continue with supportive care.  Alcohol withdrawal: Suspect encephalopathy is from hepatic etiology rather than alcohol withdrawal. Per patient (confirmed with spouse multiple times in the past few days-and again today) her last drink was approximately 7-10 days prior to hospitalization. She appears to be out of the window for any sort of alcohol withdrawal at this point. Counseled regarding abstinence from alcohol on 4/18.  Discussed  with GI MD-given ongoing encephalopathy-avoid  benzodiazepines as this will worsen/aggravate hepatic encephalopathy further.  Menorrhagia: Per patient she has been on her menstrual period for approximately 2 weeks now-apparently this has occurred in the past as well-and she has made an appointment with a GYN MD at [place]-this history was confirmed with the patient's spouse over the phone.  Pregnancy test on admission was negative.  Pelvic/transvaginal ultrasound without any major abnormalities.  AKI : Overall improved-creatinine slightly up this morning.  Hypokalemia: Repleted  Goals of care: Full code for now-unfortunate 39 year old female-with longstanding history of alcohol use-presenting with severe alcoholic hepatitis with associated hepatic encephalopathy-not responding well to supportive care/hepatic encephalopathy treatment.  She is at significant risk for further deterioration.  Long discussion with spouse over the phone-explained that she is on the maximal treatment-and due to her alcohol use-she really is not a great candidate for liver transplantation (confirmed with GI MD).  He is aware that if she continues to deteriorate in spite of maximal medical care-she remains at significant risk of mortality with a approximate MELD score of 26.  Have consulted palliative care.  Diet: Diet Order            Diet regular Room service appropriate? No; Fluid consistency: Thin  Diet effective now              Code Status: Full code  Family Communication: Spouse over the phone on 4/19  Disposition Plan: Remain inpatient-not stable for discharge-we will probably require home health services when ready  Barriers to Discharge: Hepatic encephalopathy, severe blood loss anemia-suspected upper GI bleeding-ongoing alcoholic hepatitis.   Antimicrobial agents: Anti-infectives (From admission, onward)   Start     Dose/Rate Route Frequency Ordered Stop   12/28/19 1445  rifaximin (XIFAXAN) tablet 550 mg     550 mg Oral 2 times daily 12/28/19  1410     12/24/19 0906  cefTRIAXone (ROCEPHIN) 2 g in sodium chloride 0.9 % 100 mL IVPB     2 g 200 mL/hr over 30 Minutes Intravenous Every 24 hours 12/24/19 0902     12/24/19 0415  cefTRIAXone (ROCEPHIN) 2 g in sodium chloride 0.9 % 100 mL IVPB     2 g 200 mL/hr over 30 Minutes Intravenous  Once 12/24/19 0408 12/24/19 0706       Time spent: 35 minutes-Greater than 50% of this time was spent in counseling, explanation of diagnosis, planning of further management, and coordination of care.  MEDICATIONS: Scheduled Meds: . folic acid  1 mg Oral Daily  . lactulose  30 g Oral Q2H  . midodrine  5 mg Oral TID WC  . multivitamin with minerals  1 tablet Oral Daily  . pantoprazole  40 mg Oral Q0600  . prednisoLONE  40 mg Oral Daily  . rifaximin  550 mg Oral BID  . sodium chloride flush  3 mL Intravenous Q12H  . thiamine injection  100 mg Intravenous Daily  . zinc sulfate  220 mg Oral Daily   Continuous Infusions: . albumin human    . cefTRIAXone (ROCEPHIN)  IV 2 g (12/30/19 0752)  . phytonadione (VITAMIN K) IV     PRN Meds:.haloperidol lactate, ondansetron **OR** ondansetron (ZOFRAN) IV, sodium chloride flush   PHYSICAL EXAM: Vital signs: Vitals:   12/29/19 2000 12/29/19 2344 12/30/19 0800 12/30/19 0900  BP: 115/77 93/65 (!) 131/96   Pulse: 97 80 (!) 102 97  Resp:  18 19   Temp:  98.2 F (36.8 C)  TempSrc:  Axillary    SpO2:  99% 96%   Weight:       Filed Weights   12/26/19 0357 12/27/19 1145 12/29/19 0400  Weight: 62.8 kg 62.8 kg 60.7 kg   Body mass index is 22.97 kg/m.   Gen Exam: Awake but very confused-attempting to get out of bed.  Grossly icteric. HEENT:atraumatic, normocephalic Chest: B/L clear to auscultation anteriorly CVS:S1S2 regular Abdomen:soft non tender, non distended Extremities:no edema Neurology: Difficult exam but seems to moving all 4 extremities. Skin: no rash  I have personally reviewed following labs and imaging studies  LABORATORY  DATA: CBC: Recent Labs  Lab 12/26/19 2233 12/27/19 0352 12/28/19 0424 12/29/19 0506 12/30/19 0308  WBC 19.0* 17.1* 21.5* 19.7* 25.3*  HGB 9.5* 9.0* 9.2* 8.2* 10.7*  HCT 28.9* 27.8* 28.2* 26.2* 33.5*  MCV 108.2* 107.3* 106.0* 108.3* 109.5*  PLT 59* 54* 67* 55* 57*    Basic Metabolic Panel: Recent Labs  Lab 12/24/19 0130 12/24/19 0130 12/24/19 0919 12/25/19 0415 12/26/19 1044 12/27/19 0352 12/28/19 0424 12/29/19 0506 12/30/19 0308  NA 130*   < > 135   < > 130* 135 133* 136 139  K 2.3*   < > 2.9*   < > 4.1 3.2* 3.5 3.7 4.2  CL 89*   < > 96*   < > 104 109 105 109 110  CO2 17*   < > 23   < > 17* 17* 18* 20* 18*  GLUCOSE 126*   < > 105*   < > 106* 115* 107* 103* 125*  BUN <5*   < > <5*   < > <5* <5* <5* <5* 8  CREATININE 1.10*   < > 0.84   < > 1.48* 1.20* 1.06* 1.08* 1.09*  CALCIUM 8.5*   < > 8.3*   < > 7.5* 7.6* 8.1* 8.1* 8.8*  MG 1.8  --   --   --  2.0  --  2.1  --   --   PHOS 3.7  --  3.3  --   --   --   --   --   --    < > = values in this interval not displayed.    GFR: Estimated Creatinine Clearance: 59.2 mL/min (A) (by C-G formula based on SCr of 1.09 mg/dL (H)).  Liver Function Tests: Recent Labs  Lab 12/26/19 1044 12/27/19 0352 12/28/19 0424 12/29/19 0506 12/30/19 0308  AST 138* 131* 128* 118* 193*  ALT 26 27 25 25  32  ALKPHOS 141* 149* 164* 164* 201*  BILITOT 20.0* 20.8* 20.5* 18.1* 22.9*  PROT 6.6 6.6 6.8 6.3* 7.9  ALBUMIN 1.1* 1.1* 1.1* 1.1* 1.6*   Recent Labs  Lab 12/24/19 0130  LIPASE 18   Recent Labs  Lab 12/26/19 1044 12/27/19 0352 12/28/19 0424 12/29/19 0506 12/30/19 0308  AMMONIA 92* 77* 142* 158* 198*    Coagulation Profile: Recent Labs  Lab 12/24/19 0919 12/25/19 0415 12/27/19 0352 12/28/19 0424 12/29/19 0506  INR 2.6* 2.3* 3.4* 2.6* 1.9*    Cardiac Enzymes: No results for input(s): CKTOTAL, CKMB, CKMBINDEX, TROPONINI in the last 168 hours.  BNP (last 3 results) No results for input(s): PROBNP in the last 8760  hours.  Lipid Profile: No results for input(s): CHOL, HDL, LDLCALC, TRIG, CHOLHDL, LDLDIRECT in the last 72 hours.  Thyroid Function Tests: No results for input(s): TSH, T4TOTAL, FREET4, T3FREE, THYROIDAB in the last 72 hours.  Anemia Panel: No results for input(s): VITAMINB12, FOLATE, FERRITIN, TIBC, IRON,  RETICCTPCT in the last 72 hours.  Urine analysis:    Component Value Date/Time   COLORURINE AMBER (A) 12/26/2019 0920   APPEARANCEUR CLOUDY (A) 12/26/2019 0920   LABSPEC 1.019 12/26/2019 0920   PHURINE 5.0 12/26/2019 0920   GLUCOSEU NEGATIVE 12/26/2019 0920   HGBUR NEGATIVE 12/26/2019 0920   BILIRUBINUR MODERATE (A) 12/26/2019 0920   KETONESUR 5 (A) 12/26/2019 0920   PROTEINUR 30 (A) 12/26/2019 0920   NITRITE NEGATIVE 12/26/2019 0920   LEUKOCYTESUR NEGATIVE 12/26/2019 0920    Sepsis Labs: Lactic Acid, Venous    Component Value Date/Time   LATICACIDVEN 2.0 ([place]) 11/21/2018 2319    MICROBIOLOGY: Recent Results (from the past 240 hour(s))  Respiratory Panel by RT PCR (Flu A&B, Covid) - Nasopharyngeal Swab     Status: None   Collection Time: 12/24/19  3:04 AM   Specimen: Nasopharyngeal Swab  Result Value Ref Range Status   SARS Coronavirus 2 by RT PCR NEGATIVE NEGATIVE Final    Comment: (NOTE) SARS-CoV-2 target nucleic acids are NOT DETECTED. The SARS-CoV-2 RNA is generally detectable in upper respiratoy specimens during the acute phase of infection. The lowest concentration of SARS-CoV-2 viral copies this assay can detect is 131 copies/mL. A negative result does not preclude SARS-Cov-2 infection and should not be used as the sole basis for treatment or other patient management decisions. A negative result may occur with  improper specimen collection/handling, submission of specimen other than nasopharyngeal swab, presence of viral mutation(s) within the areas targeted by this assay, and inadequate number of viral copies (<131 copies/mL). A negative result must be  combined with clinical observations, patient history, and epidemiological information. The expected result is Negative. Fact Sheet for Patients:  PinkCheek.be Fact Sheet for Healthcare Providers:  GravelBags.it This test is not yet ap proved or cleared by the [place] FDA and  has been authorized for detection and/or diagnosis of SARS-CoV-2 by FDA under an Emergency Use Authorization (EUA). This EUA will remain  in effect (meaning this test can be used) for the duration of the COVID-19 declaration under Section 564(b)(1) of the Act, 21 U.S.C. section 360bbb-3(b)(1), unless the authorization is terminated or revoked sooner.    Influenza A by PCR NEGATIVE NEGATIVE Final   Influenza B by PCR NEGATIVE NEGATIVE Final    Comment: (NOTE) The Xpert Xpress SARS-CoV-2/FLU/RSV assay is intended as an aid in  the diagnosis of influenza from Nasopharyngeal swab specimens and  should not be used as a sole basis for treatment. Nasal washings and  aspirates are unacceptable for Xpert Xpress SARS-CoV-2/FLU/RSV  testing. Fact Sheet for Patients: PinkCheek.be Fact Sheet for Healthcare Providers: GravelBags.it This test is not yet approved or cleared by the [place] FDA and  has been authorized for detection and/or diagnosis of SARS-CoV-2 by  FDA under an Emergency Use Authorization (EUA). This EUA will remain  in effect (meaning this test can be used) for the duration of the  Covid-19 declaration under Section 564(b)(1) of the Act, 21  U.S.C. section 360bbb-3(b)(1), unless the authorization is  terminated or revoked. Performed at [facility], [place] [address] [place], [place] [address]   SARS Coronavirus 2 by RT PCR     Status: None   Collection Time: 12/24/19  3:04 AM  Result Value Ref Range Status   SARS Coronavirus 2 NEGATIVE NEGATIVE Final    Comment:  (NOTE) Result indicates the ABSENCE of SARS-CoV-2 RNA in the patient specimen.  The lowest concentration of SARS-CoV-2 viral copies this  assay can detect in nasopharyngeal swab specimens is 500 copies / mL.  A negative result does not preclude SARS-CoV-2 infection and should not be used as the sole basis for patient management decisions. A negative result may occur with improper specimen collection / handling, submission of a specimen other than nasopharyngeal swab, presence of viral mutation(s) within the areas targeted by this assay, and inadequate number of viral copies (<500 copies / mL) present.  Negative results must be combined with clinical observations, patient history, and epidemiological information.  The expected result is NEGATIVE.  Patient Fact Sheet:  BlogSelections.co.uk   Provider Fact Sheet:  https://lucas.com/   This test is not yet approved or cleared by the [place] FDA and  has been authorized for  detection and/or diagnosis of SARS-CoV-2 by FDA under an Emergency Use Authorization (EUA).  This EUA will remain in effect (meaning this test can be used) for the duration of  the COVID-19 declaration under Section 564(b)(1) of the Act, 21 U.S.C. section 360bbb-3(b)(1), unless the authorization is terminated or revoked sooner Performed at [facility], [place] [address] [place], [place] [address]   Culture, blood (routine x 2)     Status: None (Preliminary result)   Collection Time: 12/26/19  3:08 PM   Specimen: BLOOD RIGHT HAND  Result Value Ref Range Status   Specimen Description BLOOD RIGHT HAND  Final   Special Requests   Final    BOTTLES DRAWN AEROBIC AND ANAEROBIC Blood Culture adequate volume   Culture   Final    NO GROWTH 4 DAYS Performed at [facility], [place] [address], [place], [place] [address]    Report Status PENDING  Incomplete  Culture, blood (routine x 2)     Status: None (Preliminary  result)   Collection Time: 12/26/19  3:17 PM   Specimen: BLOOD RIGHT WRIST  Result Value Ref Range Status   Specimen Description BLOOD RIGHT WRIST  Final   Special Requests   Final    BOTTLES DRAWN AEROBIC AND ANAEROBIC Blood Culture adequate volume   Culture   Final    NO GROWTH 4 DAYS Performed at [facility], [place] [address] [place], [place] [address]    Report Status PENDING  Incomplete    RADIOLOGY STUDIES/RESULTS: US Abdomen Limited  Result Date: 12/28/2019 CLINICAL DATA:  Ascites.  History of alcohol abuse. EXAM: LIMITED ABDOMEN ULTRASOUND FOR ASCITES TECHNIQUE: Limited ultrasound survey for ascites was performed in all four abdominal quadrants. COMPARISON:  Right upper quadrant ultrasound 12/23/2019, pelvic ultrasound 12/26/2019 FINDINGS: All 4 quadrants were scanned for ascites. Small volume ascites in the right upper quadrant, trace free fluid in the other quadrants. IMPRESSION: Minimal abdominal ascites most prominent in the right upper quadrant. Electronically Signed   By: Keith Rake M.D.   On: 12/28/2019 19:47     LOS: 6 days   Oren Binet, MD  [facility]    To contact the attending provider between 7A-7P or the covering provider during after hours 7P-7A, please log into the web site [email or web address] and access using universal [place] password for that web site. If you do not have the password, please call the hospital operator.  12/30/2019, 11:50 AM

## 2019-12-30 NOTE — Progress Notes (Signed)
Rounding on patient, pt cleaned due to incontinence, bath and linens changed, pt also has bleeding from the mouth due to bitting tongue, mouth suction and swabbed with oral antiseptic, no more bleeding at this time, made MD aware of this, removed restraints from pt at this time, pt is still very confused, but not attempting to get out of bed. Bed alarm on, will continue to monitor.

## 2019-12-30 NOTE — Progress Notes (Signed)
Doctor paged about worsening agitation and aggression, pt is confused, will not respond to questions. Trying to jump out of bed and kick staff, it is not yet time for PRN haldol, Doctor at bedside, stated he would place orders. Will CTM.

## 2019-12-30 NOTE — Plan of Care (Signed)

## 2019-12-31 ENCOUNTER — Inpatient Hospital Stay (HOSPITAL_COMMUNITY): Payer: Medicaid Other

## 2019-12-31 DIAGNOSIS — K7011 Alcoholic hepatitis with ascites: Secondary | ICD-10-CM

## 2019-12-31 DIAGNOSIS — R6521 Severe sepsis with septic shock: Secondary | ICD-10-CM

## 2019-12-31 DIAGNOSIS — R4182 Altered mental status, unspecified: Secondary | ICD-10-CM | POA: Diagnosis not present

## 2019-12-31 DIAGNOSIS — R188 Other ascites: Secondary | ICD-10-CM

## 2019-12-31 DIAGNOSIS — E8809 Other disorders of plasma-protein metabolism, not elsewhere classified: Secondary | ICD-10-CM

## 2019-12-31 DIAGNOSIS — Z515 Encounter for palliative care: Secondary | ICD-10-CM

## 2019-12-31 DIAGNOSIS — Z7189 Other specified counseling: Secondary | ICD-10-CM

## 2019-12-31 DIAGNOSIS — A419 Sepsis, unspecified organism: Secondary | ICD-10-CM

## 2019-12-31 DIAGNOSIS — J9601 Acute respiratory failure with hypoxia: Secondary | ICD-10-CM

## 2019-12-31 LAB — CULTURE, BLOOD (ROUTINE X 2)
Culture: NO GROWTH
Culture: NO GROWTH
Special Requests: ADEQUATE
Special Requests: ADEQUATE

## 2019-12-31 LAB — HEMOGLOBIN AND HEMATOCRIT, BLOOD
HCT: 25.2 % — ABNORMAL LOW (ref 36.0–46.0)
HCT: 22.9 % — ABNORMAL LOW (ref 36.0–46.0)
Hemoglobin: 7.9 g/dL — ABNORMAL LOW (ref 12.0–15.0)
Hemoglobin: 7.1 g/dL — ABNORMAL LOW (ref 12.0–15.0)

## 2019-12-31 LAB — CBC
HCT: 29.3 % — ABNORMAL LOW (ref 36.0–46.0)
Hemoglobin: 9.3 g/dL — ABNORMAL LOW (ref 12.0–15.0)
MCH: 35.4 pg — ABNORMAL HIGH (ref 26.0–34.0)
MCHC: 31.7 g/dL (ref 30.0–36.0)
MCV: 111.4 fL — ABNORMAL HIGH (ref 80.0–100.0)
Platelets: 52 10*3/uL — ABNORMAL LOW (ref 150–400)
RBC: 2.63 MIL/uL — ABNORMAL LOW (ref 3.87–5.11)
RDW: 27.3 % — ABNORMAL HIGH (ref 11.5–15.5)
WBC: 33.6 10*3/uL — ABNORMAL HIGH (ref 4.0–10.5)
nRBC: 8.8 % — ABNORMAL HIGH (ref 0.0–0.2)

## 2019-12-31 LAB — GLUCOSE, CAPILLARY
Glucose-Capillary: 123 mg/dL — ABNORMAL HIGH (ref 70–99)
Glucose-Capillary: 124 mg/dL — ABNORMAL HIGH (ref 70–99)
Glucose-Capillary: 145 mg/dL — ABNORMAL HIGH (ref 70–99)
Glucose-Capillary: 72 mg/dL (ref 70–99)
Glucose-Capillary: 87 mg/dL (ref 70–99)
Glucose-Capillary: 89 mg/dL (ref 70–99)
Glucose-Capillary: 91 mg/dL (ref 70–99)

## 2019-12-31 LAB — BASIC METABOLIC PANEL
Anion gap: 10 (ref 5–15)
BUN: 11 mg/dL (ref 6–20)
CO2: 23 mmol/L (ref 22–32)
Calcium: 9.2 mg/dL (ref 8.9–10.3)
Chloride: 117 mmol/L — ABNORMAL HIGH (ref 98–111)
Creatinine, Ser: 0.86 mg/dL (ref 0.44–1.00)
GFR calc Af Amer: >60 mL/min (ref >60)
GFR calc non Af Amer: >60 mL/min (ref >60)
Glucose, Bld: 93 mg/dL (ref 70–99)
Potassium: 2.9 mmol/L — ABNORMAL LOW (ref 3.5–5.1)
Sodium: 150 mmol/L — ABNORMAL HIGH (ref 135–145)

## 2019-12-31 LAB — HEPATIC FUNCTION PANEL
ALT: 39 U/L (ref 0–44)
AST: 279 U/L — ABNORMAL HIGH (ref 15–41)
Albumin: 2.3 g/dL — ABNORMAL LOW (ref 3.5–5.0)
Alkaline Phosphatase: 178 U/L — ABNORMAL HIGH (ref 38–126)
Bilirubin, Direct: 17.1 mg/dL — ABNORMAL HIGH (ref 0.0–0.2)
Indirect Bilirubin: 7.6 mg/dL — ABNORMAL HIGH (ref 0.3–0.9)
Total Bilirubin: 24.7 mg/dL (ref 0.3–1.2)
Total Protein: 7.5 g/dL (ref 6.5–8.1)

## 2019-12-31 LAB — POCT I-STAT 7, (LYTES, BLD GAS, ICA,H+H)
Acid-Base Excess: 2 mmol/L (ref 0.0–2.0)
Bicarbonate: 26.3 mmol/L (ref 20.0–28.0)
Calcium, Ion: 1.28 mmol/L (ref 1.15–1.40)
HCT: 29 % — ABNORMAL LOW (ref 36.0–46.0)
Hemoglobin: 9.9 g/dL — ABNORMAL LOW (ref 12.0–15.0)
O2 Saturation: 100 %
Patient temperature: 99.2
Potassium: 3.4 mmol/L — ABNORMAL LOW (ref 3.5–5.1)
Sodium: 158 mmol/L — ABNORMAL HIGH (ref 135–145)
TCO2: 27 mmol/L (ref 22–32)
pCO2 arterial: 38.2 mmHg (ref 32.0–48.0)
pH, Arterial: 7.448 (ref 7.350–7.450)
pO2, Arterial: 346 mmHg — ABNORMAL HIGH (ref 83.0–108.0)

## 2019-12-31 LAB — PROTIME-INR
INR: 1.8 — ABNORMAL HIGH (ref 0.8–1.2)
INR: 2 — ABNORMAL HIGH (ref 0.8–1.2)
Prothrombin Time: 21.2 seconds — ABNORMAL HIGH (ref 11.4–15.2)
Prothrombin Time: 22.6 seconds — ABNORMAL HIGH (ref 11.4–15.2)

## 2019-12-31 LAB — MRSA PCR SCREENING: MRSA by PCR: NEGATIVE

## 2019-12-31 LAB — AMMONIA: Ammonia: 197 umol/L — ABNORMAL HIGH (ref 9–35)

## 2019-12-31 LAB — PHOSPHORUS: Phosphorus: 2.8 mg/dL (ref 2.5–4.6)

## 2019-12-31 LAB — MAGNESIUM: Magnesium: 2.7 mg/dL — ABNORMAL HIGH (ref 1.7–2.4)

## 2019-12-31 MED ORDER — ROCURONIUM BROMIDE 10 MG/ML (PF) SYRINGE
PREFILLED_SYRINGE | INTRAVENOUS | Status: AC
  Filled 2019-12-31: qty 10

## 2019-12-31 MED ORDER — LACTULOSE 10 GM/15ML PO SOLN
30.0000 g | Freq: Three times a day (TID) | ORAL | Status: DC
  Administered 2019-12-31 – 2020-01-03 (×9): 30 g
  Filled 2019-12-31 (×9): qty 45

## 2019-12-31 MED ORDER — LACTATED RINGERS IV BOLUS
1000.0000 mL | Freq: Once | INTRAVENOUS | Status: AC
  Administered 2019-12-31: 1000 mL via INTRAVENOUS

## 2019-12-31 MED ORDER — NOREPINEPHRINE 4 MG/250ML-% IV SOLN
2.0000 ug/min | INTRAVENOUS | Status: DC
  Administered 2019-12-31: 2 ug/min via INTRAVENOUS
  Filled 2019-12-31: qty 250

## 2019-12-31 MED ORDER — ALBUTEROL SULFATE (2.5 MG/3ML) 0.083% IN NEBU: 2.5000 mg | INHALATION_SOLUTION | RESPIRATORY_TRACT | Status: DC | PRN

## 2019-12-31 MED ORDER — ETOMIDATE 2 MG/ML IV SOLN
INTRAVENOUS | Status: AC
  Filled 2019-12-31: qty 20

## 2019-12-31 MED ORDER — POTASSIUM CHLORIDE 20 MEQ/15ML (10%) PO SOLN
20.0000 meq | Freq: Once | ORAL | Status: AC
  Administered 2019-12-31: 20 meq
  Filled 2019-12-31: qty 15

## 2019-12-31 MED ORDER — DOCUSATE SODIUM 50 MG/5ML PO LIQD
100.0000 mg | Freq: Two times a day (BID) | ORAL | Status: DC
  Administered 2020-01-01: 09:00:00 100 mg
  Filled 2019-12-31 (×3): qty 10

## 2019-12-31 MED ORDER — ORAL CARE MOUTH RINSE
15.0000 mL | OROMUCOSAL | Status: DC
  Administered 2019-12-31 – 2020-01-05 (×51): 15 mL via OROMUCOSAL

## 2019-12-31 MED ORDER — POTASSIUM CHLORIDE 20 MEQ/15ML (10%) PO SOLN
40.0000 meq | Freq: Once | ORAL | Status: AC
  Administered 2019-12-31: 40 meq
  Filled 2019-12-31: qty 30

## 2019-12-31 MED ORDER — VECURONIUM BROMIDE 10 MG IV SOLR
INTRAVENOUS | Status: AC
  Filled 2019-12-31: qty 10

## 2019-12-31 MED ORDER — PANTOPRAZOLE SODIUM 40 MG PO PACK
40.0000 mg | PACK | Freq: Every day | ORAL | Status: DC
  Administered 2019-12-31 – 2020-01-07 (×8): 40 mg
  Filled 2019-12-31 (×10): qty 20

## 2019-12-31 MED ORDER — SODIUM CHLORIDE 0.9 % IV SOLN: INTRAVENOUS | Status: DC

## 2019-12-31 MED ORDER — RIFAXIMIN 550 MG PO TABS
550.0000 mg | ORAL_TABLET | Freq: Two times a day (BID) | ORAL | Status: DC
  Administered 2019-12-31 – 2020-01-08 (×17): 550 mg
  Filled 2019-12-31 (×17): qty 1

## 2019-12-31 MED ORDER — SODIUM CHLORIDE 0.9 % IV SOLN
250.0000 mL | INTRAVENOUS | Status: DC
  Administered 2019-12-31 – 2020-01-05 (×3): 250 mL via INTRAVENOUS

## 2019-12-31 MED ORDER — STERILE WATER FOR INJECTION IJ SOLN
INTRAMUSCULAR | Status: AC
  Filled 2019-12-31: qty 10

## 2019-12-31 MED ORDER — PIPERACILLIN-TAZOBACTAM 3.375 G IVPB
3.3750 g | Freq: Three times a day (TID) | INTRAVENOUS | Status: AC
  Administered 2019-12-31 – 2020-01-04 (×15): 3.375 g via INTRAVENOUS
  Filled 2019-12-31 (×15): qty 50

## 2019-12-31 MED ORDER — VANCOMYCIN HCL 1250 MG/250ML IV SOLN
1250.0000 mg | Freq: Once | INTRAVENOUS | Status: AC
  Administered 2019-12-31: 1250 mg via INTRAVENOUS
  Filled 2019-12-31: qty 250

## 2019-12-31 MED ORDER — SUCCINYLCHOLINE CHLORIDE 20 MG/ML IJ SOLN
100.0000 mg | Freq: Once | INTRAMUSCULAR | Status: AC
  Administered 2019-12-31: 100 mg via INTRAVENOUS

## 2019-12-31 MED ORDER — PRO-STAT SUGAR FREE PO LIQD
30.0000 mL | Freq: Every day | ORAL | Status: DC
  Administered 2019-12-31 – 2020-01-06 (×7): 30 mL
  Filled 2019-12-31 (×7): qty 30

## 2019-12-31 MED ORDER — FENTANYL CITRATE (PF) 100 MCG/2ML IJ SOLN
INTRAMUSCULAR | Status: AC
  Administered 2019-12-31: 100 ug
  Filled 2019-12-31: qty 2

## 2019-12-31 MED ORDER — MIDAZOLAM HCL 2 MG/2ML IJ SOLN
INTRAMUSCULAR | Status: AC
  Filled 2019-12-31: qty 4

## 2019-12-31 MED ORDER — FENTANYL CITRATE (PF) 100 MCG/2ML IJ SOLN
50.0000 ug | INTRAMUSCULAR | Status: DC | PRN
  Administered 2019-12-31 – 2020-01-03 (×14): 100 ug via INTRAVENOUS
  Administered 2020-01-03: 200 ug via INTRAVENOUS
  Administered 2020-01-04: 100 ug via INTRAVENOUS
  Administered 2020-01-04: 50 ug via INTRAVENOUS
  Administered 2020-01-05 (×3): 100 ug via INTRAVENOUS
  Filled 2019-12-31 (×17): qty 2
  Filled 2019-12-31: qty 4
  Filled 2019-12-31 (×2): qty 2

## 2019-12-31 MED ORDER — SUCCINYLCHOLINE CHLORIDE 200 MG/10ML IV SOSY
PREFILLED_SYRINGE | INTRAVENOUS | Status: AC
  Administered 2019-12-31: 200 mg
  Filled 2019-12-31: qty 10

## 2019-12-31 MED ORDER — VANCOMYCIN HCL 750 MG/150ML IV SOLN
750.0000 mg | Freq: Two times a day (BID) | INTRAVENOUS | Status: DC
  Administered 2019-12-31 – 2020-01-01 (×2): 750 mg via INTRAVENOUS
  Filled 2019-12-31 (×2): qty 150

## 2019-12-31 MED ORDER — OXYMETAZOLINE HCL 0.05 % NA SOLN
1.0000 | Freq: Two times a day (BID) | NASAL | Status: DC
  Administered 2020-01-01 – 2020-01-03 (×5): 1 via NASAL
  Filled 2019-12-31 (×2): qty 30

## 2019-12-31 MED ORDER — FOLIC ACID 1 MG PO TABS
1.0000 mg | ORAL_TABLET | Freq: Every day | ORAL | Status: DC
  Administered 2019-12-31: 1 mg
  Filled 2019-12-31: qty 1

## 2019-12-31 MED ORDER — MIDODRINE HCL 5 MG PO TABS
5.0000 mg | ORAL_TABLET | Freq: Three times a day (TID) | ORAL | Status: DC
  Administered 2019-12-31 – 2020-01-08 (×21): 5 mg
  Filled 2019-12-31 (×21): qty 1

## 2019-12-31 MED ORDER — ETOMIDATE 2 MG/ML IV SOLN
20.0000 mg | Freq: Once | INTRAVENOUS | Status: AC
  Administered 2019-12-31: 20 mg via INTRAVENOUS

## 2019-12-31 MED ORDER — PROPOFOL 10 MG/ML IV BOLUS
INTRAVENOUS | Status: AC
  Filled 2019-12-31: qty 20

## 2019-12-31 MED ORDER — DEXMEDETOMIDINE HCL IN NACL 400 MCG/100ML IV SOLN
0.0000 ug/kg/h | INTRAVENOUS | Status: DC
  Administered 2019-12-31: 0.2 ug/kg/h via INTRAVENOUS
  Administered 2019-12-31: 0.4 ug/kg/h via INTRAVENOUS
  Filled 2019-12-31 (×2): qty 100

## 2019-12-31 MED ORDER — LACTULOSE 10 GM/15ML PO SOLN
30.0000 g | ORAL | Status: DC
  Administered 2019-12-31 (×2): 30 g
  Filled 2019-12-31 (×2): qty 45

## 2019-12-31 MED ORDER — ADULT MULTIVITAMIN W/MINERALS CH
1.0000 | ORAL_TABLET | Freq: Every day | ORAL | Status: DC
  Administered 2019-12-31: 1
  Filled 2019-12-31: qty 1

## 2019-12-31 MED ORDER — PREDNISOLONE 5 MG PO TABS
40.0000 mg | ORAL_TABLET | Freq: Every day | ORAL | Status: DC
  Administered 2019-12-31 – 2020-01-04 (×5): 40 mg
  Filled 2019-12-31 (×6): qty 8

## 2019-12-31 MED ORDER — FREE WATER
200.0000 mL | Status: DC
  Administered 2019-12-31 – 2020-01-07 (×40): 200 mL

## 2019-12-31 MED ORDER — THIAMINE HCL 100 MG PO TABS: 100.0000 mg | ORAL_TABLET | Freq: Every day | ORAL | Status: DC

## 2019-12-31 MED ORDER — VITAL 1.5 CAL PO LIQD
1000.0000 mL | ORAL | Status: DC
  Administered 2019-12-31 – 2020-01-05 (×6): 1000 mL
  Filled 2019-12-31 (×6): qty 1000

## 2019-12-31 MED ORDER — POLYETHYLENE GLYCOL 3350 17 G PO PACK
17.0000 g | PACK | Freq: Every day | ORAL | Status: DC
  Administered 2020-01-01: 17 g via ORAL
  Filled 2019-12-31 (×2): qty 1

## 2019-12-31 MED ORDER — DOCUSATE SODIUM 50 MG/5ML PO LIQD: 100.0000 mg | Freq: Two times a day (BID) | ORAL | Status: DC

## 2019-12-31 MED ORDER — FENTANYL CITRATE (PF) 100 MCG/2ML IJ SOLN: 100.0000 ug | Freq: Once | INTRAMUSCULAR | Status: DC

## 2019-12-31 MED ORDER — FENTANYL CITRATE (PF) 100 MCG/2ML IJ SOLN
50.0000 ug | INTRAMUSCULAR | Status: DC | PRN
  Filled 2019-12-31: qty 2

## 2019-12-31 MED ORDER — CHLORHEXIDINE GLUCONATE CLOTH 2 % EX PADS
6.0000 | MEDICATED_PAD | Freq: Every day | CUTANEOUS | Status: DC
  Administered 2019-12-31 – 2020-01-08 (×8): 6 via TOPICAL

## 2019-12-31 MED ORDER — ZINC SULFATE 220 (50 ZN) MG PO CAPS
220.0000 mg | ORAL_CAPSULE | Freq: Every day | ORAL | Status: DC
  Administered 2019-12-31 – 2020-01-08 (×9): 220 mg
  Filled 2019-12-31 (×9): qty 1

## 2019-12-31 MED ORDER — CHLORHEXIDINE GLUCONATE 0.12% ORAL RINSE (MEDLINE KIT)
15.0000 mL | Freq: Two times a day (BID) | OROMUCOSAL | Status: DC
  Administered 2019-12-31 – 2020-01-05 (×11): 15 mL via OROMUCOSAL

## 2019-12-31 NOTE — Progress Notes (Signed)
Joann [name] was admitted 12/24/19 with hepatic encephalopathy and alcoholic hepatitis, has been worsening despite treatment with lactulose and prednisolone, became obtunded 4/21, mental status remains poor and RN now calling regarding new epistaxis.   SBP is 120s, HR 119, RR 32, O2 93% on rm air.   Patient's eyes are open but no verbal response and minimal response to pain. GCS is 6.   Concerned that patient unable to protect airway and PCCM consulted, their care much appreciated.

## 2019-12-31 NOTE — Procedures (Signed)
Patient Name: SHAQUISHA WYNN  MRN: [id number]  Epilepsy Attending: Lora Havens  Referring Physician/Provider: Dr Oren Binet Date: 12/30/2019 Duration: 22.25 mins  Patient history: 39 yo F with h/o etoh dependence and seizures admitted for ams. EEG to evaluate for seizure.    Level of alertness: lethargic  AEDs during EEG study: None  Technical aspects: This EEG study was done with scalp electrodes positioned according to the 10-20 International system of electrode placement. Electrical activity was acquired at a sampling rate of 500Hz  and reviewed with a high frequency filter of 70Hz  and a low frequency filter of 1Hz . EEG data were recorded continuously and digitally stored.   DESCRIPTION: EEG showed continuous generalized low amplitude 2-3hz  delta slowing. Hyperventilation and photic stimulation were not performed.  EEG was technically difficult due to significant myogenic artifact.  ABNORMALITY - Continuous slow, generalized   IMPRESSION: This technically difficult study is suggestive of severe diffuse encephalopathy, non specific to etiology. No seizures or epileptiform discharges were seen throughout the recording.  Jadasia Haws Barbra Sarks

## 2019-12-31 NOTE — Progress Notes (Signed)
Nutrition Follow-up  DOCUMENTATION CODES:   Not applicable  INTERVENTION:   Initiate TF via Cortrak: - Vital 1.5 @ 40 ml/hr (960 ml/day) - Pro-stat 30 ml daily - Free water per CCM, currently 200 ml q 4 hours  Tube feeding regimen and current free water provides 1540 kcal, 80 grams of protein, and 1933 ml of H2O (99% of kcal needs, 100% of protein needs).  - Continue MVI with minerals daily  NUTRITION DIAGNOSIS:   Increased nutrient needs related to chronic illness (cirrhosis, hepatitits) as evidenced by estimated needs.  Ongoing, being addressed via TF  GOAL:   Patient will meet greater than or equal to 90% of their needs  Met via TF at goal  MONITOR:   Vent status, Labs, Weight trends, TF tolerance, I & O's  REASON FOR ASSESSMENT:   Consult Enteral/tube feeding initiation and management  ASSESSMENT:   39 y.o. female history of alcoholic hepatitis, probable liver cirrhosis-longstanding history of alcohol abuse-presenting with confusion-found to have acute hepatic encephalopathy.  4/21 - Cortrak placed, tip in stomach, TF initiated 4/22 - intubated  Cortrak remains in place, currently clamped.  Discussed pt with RN. Verbal with readback order placed per discussion with Dr. Elsworth Soho regarding restarting TF.  Current TF orders: Osmolite 1.5 @ 55 ml/hr, free water 200 ml q 4 hours  RD to adjust TF orders to better meet pt's needs while intubated.  Patient is currently intubated on ventilator support MV: 9.4 L/min Temp (24hrs), Avg:98.4 F (36.9 C), Min:96.9 F (36.1 C), Max:99.2 F (37.3 C) BP (cuff): 97/63 MAP (cuff): 74  Drips: Propofol: none Precedex: 12.5 ml/hr  Medications reviewed and include: colace, folic acid, lactulose 30 grams q 2 hours, MVI with minerals, protonix, Miralax, thiamine, zinc sulfate, IV albumin, IV vitamin K, IV abx, KCl 20 mEq once  Labs reviewed: sodium 158, potassium 3.4, magnesium 2.7, hemoglobin 9.9 CBG's: 72-109  UOP:  1550 ml x 24 hours Rectal tube: 950 ml + 1 unmeasured occurrence x 24 horus I/O's: +3.5 L since admit  NUTRITION - FOCUSED PHYSICAL EXAM:    Most Recent Value  Orbital Region  No depletion  Upper Arm Region  Mild depletion  Thoracic and Lumbar Region  No depletion  Buccal Region  Unable to assess  Temple Region  No depletion  Clavicle Bone Region  Mild depletion  Clavicle and Acromion Bone Region  Mild depletion  Scapular Bone Region  Unable to assess  Dorsal Hand  No depletion  Patellar Region  Mild depletion  Anterior Thigh Region  Moderate depletion  Posterior Calf Region  Moderate depletion  Edema (RD Assessment)  None  Hair  Reviewed  Eyes  Unable to assess  Mouth  Unable to assess  Skin  Reviewed  Nails  Reviewed       Diet Order:   Diet Order            Diet regular Room service appropriate? Yes; Fluid consistency: Thin  Diet effective now              EDUCATION NEEDS:   Not appropriate for education at this time  Skin:  Skin Assessment: Reviewed RN Assessment (MASD to buttocks)  Last BM:  12/31/19 type 7 via rectal tube  Height:   Ht Readings from Last 1 Encounters:  12/31/19 5' 4"  (1.626 m)    Weight:   Wt Readings from Last 1 Encounters:  12/31/19 63.9 kg    BMI:  Body mass index is 24.18 kg/m.  Estimated Nutritional Needs:   Kcal:  9102  Protein:  80-100 grams  Fluid:  >/= 1.6 L    Gaynell Face, MS, RD, LDN Inpatient Clinical Dietitian Pager: [phone] Weekend/After Hours: [phone]

## 2019-12-31 NOTE — Progress Notes (Signed)
At approximately 0445, I was called to the patient's room by the nurse tech who informed me that the patient had scored a red mews.  On assessment, pt was unresponsive (as she had been throughout the shift), respiratory rate in the 30's and heart rate in the 120's sustained.  She was making a "snorting" sound with each inspiration; upon further investigation epistaxis from the right nare was noted.  Using suction close to the tip of the nare, I was able to remove a large blood clot from the nare.  After removal of the clot, she continued to bleed steadily through the right nare, also draining into the mouth.  She kept her mouth clamped shut, so I could not get a good suction of her mouth.    Called David from rapid response, who was responding to another emergency and was unable to come to the bedside right away.  Paged [facility] on-call provider Dr. Myna Hidalgo, who came to the bedside, assessed patient, and consulted critical care to determine patient was appropriate for transfer to ICU.

## 2019-12-31 NOTE — Progress Notes (Signed)
Patient continued to ooze blood from right nare so merocel epistaxis packing placed in right nare without difficulty.  Bleeding appears to have stopped.

## 2019-12-31 NOTE — Procedures (Signed)
Intubation Procedure Note ALAINNA STAWICKI [id number] 10-19-1980  Procedure: Intubation Indications: Respiratory insufficiency  Procedure Details Consent: Unable to obtain consent because of altered level of consciousness. Time Out: Verified patient identification, verified procedure, site/side was marked, verified correct patient position, special equipment/implants available, medications/allergies/relevent history reviewed, required imaging and test results available.  Performed  Maximum sterile technique was used including cap, gloves, gown, hand hygiene and mask.  3 glidescope    Evaluation Hemodynamic Status: BP stable throughout; O2 sats: stable throughout Patient's Current Condition: stable Complications: No apparent complications Patient did tolerate procedure well. Chest X-ray ordered to verify placement.  CXR: pending.   Shellia Cleverly 12/31/2019

## 2019-12-31 NOTE — Consult Note (Signed)
NAME:  Joann [name], MRN:  [id number], DOB:  [birth year], LOS: 7 ADMISSION DATE:  12/23/2019, CONSULTATION DATE:  12/31/2019 REFERRING MD:  Dr. Myna Hidalgo, CHIEF COMPLAINT:  AMS  Brief History   53 yoF with ETOH abuse and alcoholic hepatitis presenting with worsening mental status found to have acute hepatic encephalopathy, anemia, AKI, and coagulapathy.  GI following.  Mental status has waxed/ waned however developed bleeding from suspected nares 4/22 and with ongoing low GCS, PCCM consulted over concerns of airway protection.    History of present illness   HPI obtained from medical chart review as patient is encephalopathic.    39 year old female with hx of HTN, ETOH abuse, alcohol pancreatitis, and withdrawal seizures admitted to [facility] on 4/14 with worsening mental status after binge drinking, found to have elevated LFTs, and concerns for active GI bleeding with anemia.  GI consulted.  Underwent EGD on 4/18 which showed non bleeding grade 2 varices.  Currently on prednisolone (started 4/17), lactulose, rifaximin, and midodrine.  Sandostatin stopped 4/19.    She has been empirically on ceftriaxone for leukocytosis with blood cultures with no growth to date.  Her AKI and LFTs have improved, however worsening ammonia levels despite increased dosing of lactulose.  Mental status has been waxing and wanning, however she had a clear CXR 4/21 and reassuring ABG.  Per RN, patient unresponsive throughout the night but developed bleeding from her nares.  Unable to be suctioned orally given clenched teeth.  PCCM consulted given concern for airway protection and active bleeding.    Past Medical History  HTN, ETOH abuse, alcohol pancreatitis, withdrawal seizures  Significant Hospital Events   4/14 Admitted to [facility] 4/18 EGD  Consults:  GI PMT PCCM  Procedures:  4/18 EGD   Significant Diagnostic Tests:  4/15 [facility] >> neg 4/18 EGD >> grade 2 varices, large amount of food in the stomach 4/19 limited abd [place] >>    Minimal abdominal ascites most prominent in the right upper quadrant.  Micro Data:  4/15 SARS 2/ Flu A/B >> neg 4/17 BCx 2 >> neg  Antimicrobials:  4/14 ceftriaxone >> 4/22 4/22 zosyn >> 4/22 vanc >>  Interim history/subjective:  Arrived to find patient unresponsive, on room air with saturations ~90%, placed on NRB and rapid response to bedside to help transfer stat to ICU   Objective   Blood pressure 124/77, pulse (!) 118, temperature 99.1 F (37.3 C), temperature source Axillary, resp. rate (!) 31, weight 60.7 kg, SpO2 93 %.        Intake/Output Summary (Last 24 hours) at 12/31/2019 0606 Last data filed at 12/31/2019 0300 Gross per 24 hour  Intake 318.25 ml  Output 850 ml  Net -531.75 ml   Filed Weights   12/26/19 0357 12/27/19 1145 12/29/19 0400  Weight: 62.8 kg 62.8 kg 60.7 kg   Examination: General:  Acutely ill appearing adult female in resp distress HEENT:  Clenched teeth, pupils 4/reactive, icterus, dried blood to nares with cortrak in place Neuro:  Eyes are open, but unresponsive, does not blind to threat or respond to noxious stimuli CV: ST, no murmur PULM:  Labored breathing, diffuse rhonchi GI: distended, round, +bs, flexi seal in place, purwick  Extremities: hot/dry, no edema  Skin: icterus   Resolved Hospital Problem list    Assessment & Plan:   Acute hypoxic respiratory failure in the setting of nose bleed and encephalopathy with presumed aspiration  P:  Attempted to call husband to clarify [place], unable to  reach  tx to ICU now and intubate for airway protection  Full MV support, PRVC 6-8 cc/kg, rate 20 Wean FiO2/ PEEP for sat goal >92-99% Post intubation CXR and ABG  In 1 hour  VAP bundle PPI  Albuterol prn  Already on ceftriaxone  PAD protocol with prn fentanyl, precedex if needed   Encephalopathy- mixed hepatic +/- ETOH withdrawal - CTH unremarkable 4/15 - with worsening ammonia despite lactulose, increase lactulose enema for goal BM 3-4/  daily - continue rifaximin - continue thiamine/ folate  Leukocytosis - stable trend, despite steroids (started 4/17), marked jump 4/22 - broaden abx to vanc/ zosyn for now - follow BC, ngtd x 4 days  - trend WBC/ fever curve    Alcohol Hepatitis  - initial MELD score 31/33 on admit, with mortality rate 52.6% - per GI recs, continuing prednisolone (started 4/17), midodrine - octreotide stopped 4/19 - continue to trend LFTs - minimal ascites on 4/19 [place], appears more distended, will need further bedside US evaluation    Macrocytic anemia  - trending CBC, transfuse for Hgb <7   Coagulopathy/ thrombocytopenia - secondary to alcoholic hepatitis/ liver cirrhosis - now with acute bleeding, noted from R nare.  No obvious bleeding from nare currently, unable to assess oral given clenched teeth.  Will further evaluate on intubation.  Noted for tongue biting 4/21 am - pk platelets and FFP now - trend CBC/ coags   AKI -resolved Hypokalemia  Hypernatremia - trend UOP/ daily renal panel - good UOP, improving sCr at baseline  - KCL 40 meq per tube now, mag level ok  - start free water - strict I/Os  GOC:  PMT consulted.  Husband is truck driver with plans per notes to visit on Friday.  Attempted to call husband, no answer.  Prognosis is guarded.   Best practice:  Diet: NPO for now, consider restarting TF post intubation Pain/Anxiety/Delirium protocol (if indicated): prn fentanyl, precedex if needed VAP protocol (if indicated): yes DVT prophylaxis: SCDs only  GI prophylaxis: PPI Glucose control: CBG q 4 Mobility: BR Code Status: Full  Family Communication: unable to reach, see above Disposition: tx to ICU   Labs   CBC: Recent Labs  Lab 12/27/19 0352 12/28/19 0424 12/29/19 0506 12/30/19 0308 12/31/19 0426  WBC 17.1* 21.5* 19.7* 25.3* 33.6*  HGB 9.0* 9.2* 8.2* 10.7* 9.3*  HCT 27.8* 28.2* 26.2* 33.5* 29.3*  MCV 107.3* 106.0* 108.3* 109.5* 111.4*  PLT 54* 67* 55* 57* 52*      Basic Metabolic Panel: Recent Labs  Lab 12/24/19 0919 12/25/19 0415 12/26/19 1044 12/26/19 1044 12/27/19 0352 12/28/19 0424 12/29/19 0506 12/30/19 0308 12/31/19 0426  NA 135   < > 130*   < > 135 133* 136 139 150*  K 2.9*   < > 4.1   < > 3.2* 3.5 3.7 4.2 2.9*  CL 96*   < > 104   < > 109 105 109 110 117*  CO2 23   < > 17*   < > 17* 18* 20* 18* 23  GLUCOSE 105*   < > 106*   < > 115* 107* 103* 125* 93  BUN <5*   < > <5*   < > <5* <5* <5* 8 11  CREATININE 0.84   < > 1.48*   < > 1.20* 1.06* 1.08* 1.09* 0.86  CALCIUM 8.3*   < > 7.5*   < > 7.6* 8.1* 8.1* 8.8* 9.2  MG  --   --  2.0  --   --  2.1  --   --  2.7*  PHOS 3.3  --   --   --   --   --   --   --  2.8   < > = values in this interval not displayed.   GFR: Estimated Creatinine Clearance: 75.1 mL/min (by C-G formula based on SCr of 0.86 mg/dL). Recent Labs  Lab 12/28/19 0424 12/29/19 0506 12/30/19 0308 12/31/19 0426  WBC 21.5* 19.7* 25.3* 33.6*    Liver Function Tests: Recent Labs  Lab 12/27/19 0352 12/28/19 0424 12/29/19 0506 12/30/19 0308 12/31/19 0426  AST 131* 128* 118* 193* 279*  ALT 27 25 25  32 39  ALKPHOS 149* 164* 164* 201* 178*  BILITOT 20.8* 20.5* 18.1* 22.9* PENDING  PROT 6.6 6.8 6.3* 7.9 7.5  ALBUMIN 1.1* 1.1* 1.1* 1.6* 2.3*   No results for input(s): LIPASE, AMYLASE in the last 168 hours. Recent Labs  Lab 12/27/19 0352 12/28/19 0424 12/29/19 0506 12/30/19 0308 12/31/19 0426  AMMONIA 77* 142* 158* 198* 197*    ABG    Component Value Date/Time   PHART 7.413 12/30/2019 1655   PCO2ART 34.1 12/30/2019 1655   PO2ART 63.8 (L) 12/30/2019 1655   HCO3 21.5 12/30/2019 1655   TCO2 20 (L) 08/28/2018 1203   ACIDBASEDEF 2.5 (H) 12/30/2019 1655   O2SAT 92.3 12/30/2019 1655     Coagulation Profile: Recent Labs  Lab 12/25/19 0415 12/27/19 0352 12/28/19 0424 12/29/19 0506 12/31/19 0426  INR 2.3* 3.4* 2.6* 1.9* 1.8*    Cardiac Enzymes: No results for input(s): CKTOTAL, CKMB, CKMBINDEX,  TROPONINI in the last 168 hours.  HbA1C: Hgb A1c MFr Bld  Date/Time Value Ref Range Status  09/29/2018 03:23 PM 4.8 4.8 - 5.6 % Final    Comment:             Prediabetes: 5.7 - 6.4          Diabetes: >6.4          Glycemic control for adults with diabetes: <7.0     CBG: Recent Labs  Lab 12/30/19 1647 12/30/19 2015 12/31/19 0034 12/31/19 0500  GLUCAP 109* 76 72 87    Review of Systems:   Unable to obtain due to encephalopathy   Past Medical History  She,  has a past medical history of Alcohol abuse, Anxiety, Hypertension, and Liver disease.   Surgical History    Past Surgical History:  Procedure Laterality Date  . ECTOPIC PREGNANCY SURGERY    . ESOPHAGOGASTRODUODENOSCOPY (EGD) WITH PROPOFOL N/A 12/27/2019   Procedure: ESOPHAGOGASTRODUODENOSCOPY (EGD) WITH PROPOFOL;  Surgeon: Carol Ada, MD;  Location: [facility];  Service: Endoscopy;  Laterality: N/A;     Social History   reports that she has never smoked. She has never used smokeless tobacco. She reports current alcohol use. She reports that she does not use drugs.   Family History   Her family history includes Alcohol abuse in her father and mother; Diabetes in her father; Healthy in her brother, brother, brother, and mother; Hypertension in her maternal grandfather and maternal grandmother.   Allergies No Known Allergies   Home Medications  Prior to Admission medications   Medication Sig Start Date End Date Taking? Authorizing Provider  hydrOXYzine (ATARAX/VISTARIL) 25 MG tablet Take 25 mg by mouth every 6 (six) hours as needed for anxiety.   Yes [provider]  Multiple Vitamins-Minerals (MULTIVITAMIN WITH MINERALS) tablet Take 1 tablet by mouth daily.   Yes [provider]  topiramate (TOPAMAX) 200 MG tablet  Take 200 mg by mouth 2 (two) times daily. 10/09/19  Yes [provider]  disulfiram (ANTABUSE) 250 MG tablet Take 250 mg by mouth daily.    [provider]  folic  acid (FOLVITE) 1 MG tablet Take 1 mg by mouth daily.    [provider]  Multiple Vitamin (MULTIVITAMIN WITH MINERALS) TABS tablet Take 1 tablet by mouth 2 (two) times a week.     [provider]  traZODone (DESYREL) 50 MG tablet Take 50 mg by mouth at bedtime.    [provider]  venlafaxine (EFFEXOR) 75 MG tablet Take 75 mg by mouth daily.    [provider]     CRITICAL CARE Performed by: Kennieth Rad   Total critical care time: 45 minutes   Critical care time was exclusive of separately billable procedures and treating other patients.   Critical care was necessary to treat or prevent imminent or life-threatening deterioration.   Critical care was time spent personally by me on the following activities: development of treatment plan with patient and/or surrogate as well as nursing, discussions with consultants, evaluation of patient's response to treatment, examination of patient, obtaining history from patient or surrogate, ordering and performing treatments and interventions, ordering and review of laboratory studies, ordering and review of radiographic studies, pulse oximetry and re-evaluation of patient's condition.  Kennieth Rad, MSN, AGACNP-BC [facility] 12/31/2019, 6:47 AM

## 2019-12-31 NOTE — Significant Event (Signed)
Rapid Response Event Note  Overview: AMS, inability to protect airway  Initial Focused Assessment: Notified by Kennieth Rad NP regarding pt needing tx to ICU for intubation.  Pt obtunded and partially obstructing her airway. Arrangements made for tx. Pt transported via bed to 30m3 with oxygen and medical equipment.   Interventions: -tx to ICU for intubation   Event Summary: Call received 0528 Arrived at call 0530 Call ended 0612  RMadelynn Done

## 2019-12-31 NOTE — Progress Notes (Signed)
Attempted to call spouse, Raejean Swinford [phone] to update him on change in patient status and verify GOC.  PMT consulted by yet to see patient.  Remains full code.    Unable to reach.  Did not go to voicemail, just rang.   See full PCCM consult note for further   Kennieth Rad, MSN, AGACNP-BC [facility] 12/31/2019, 5:54 AM

## 2019-12-31 NOTE — Progress Notes (Signed)
Pharmacy Antibiotic Note  Joann [name] is a 39 y.o. female Transferred to ICU with VDRF and leukocytosis, possible sepsis.  Pharmacy has been consulted for Vancomycin and Zosyn  dosing.  Plan: Vancomycin 1250 m g IV now, then 750 mg IV q12h Est AUC 512 Zosyn 3.375 g IV q8h   Height: 5' 4"  (162.6 cm) Weight: 60.7 kg (133 lb 13.1 oz) IBW/kg (Calculated) : 54.7  Temp (24hrs), Avg:98.3 F (36.8 C), Min:96.9 F (36.1 C), Max:99.2 F (37.3 C)  Recent Labs  Lab 12/27/19 0352 12/28/19 0424 12/29/19 0506 12/30/19 0308 12/31/19 0426  WBC 17.1* 21.5* 19.7* 25.3* 33.6*  CREATININE 1.20* 1.06* 1.08* 1.09* 0.86    Estimated Creatinine Clearance: 75.1 mL/min (by C-G formula based on SCr of 0.86 mg/dL).    No Known Allergies   Caryl Pina 12/31/2019 6:58 AM

## 2019-12-31 NOTE — Progress Notes (Signed)
Patient transferred to ICU along side of critical care MD and rapid response RN, report given to Ellard Artis, RN. Patient belongings taken to room.   Attempted to call husband, Izora Gala but there was no answer at this time.

## 2019-12-31 NOTE — Consult Note (Signed)
Consultation Note Date: 12/31/2019   Patient Name: Joann [name]  DOB: 06-Mar-1981  MRN: [id number]  Age / Sex: 39 y.o., female  PCP: Joann [name] Referring Physician: Jonetta Osgood, MD  Reason for Consultation: Establishing goals of care  HPI/Patient Profile: 39 y.o. female  with past medical history of alcoholic hepatitis, chronic alcoholism (with previous hospitalizations), alcohol pancreatitis, admitted on 12/23/2019 with confusion, poor po intake and findings of acute hepatic encephalopathy, hepatic cirrhosis, acute blood loss anemia due to upper GI bleeding. MELD 33 on admission, Glasgow alcoholic hepatitis score 9.  During admission ammonia level has been refractory to lactulose treatment. EGD showed grade 2 varices- no active bleeding- however patient has had episodes of hematemesis and bloody stools. Now with significant epistaxis. Initially with acute kidney injury- but this improved. Not a candidate for liver transplant due to ETOH use. On 4/22 she was found unresponsive and intubated for airway protection. Palliative medicine consulted for goals of care.   Clinical Assessment and Goals of Care: I reviewed chart evaluated patient and called her spouse- Joann [name].  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 She is Mom to 17 and 36 yr old sons- Joann [name] and Joann [name].  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  Patient's current state and events of this morning reviewed with Joann [name].  Illness trajectory and disease education provided.  All treatment options discussed- relayed concern that patient is on maximum treatment and is deteriorating despite all interventions. Answered Joann [name]'s questions re: why not candidate for liver transplant.  Discussed with Joann [name] patient's high risk of dying. Joann [name] is aware that [name] may  be at end of life.  Joann [name] was very distraught- support provided. He requested that I call Dianely's mother- Joann [name] and update her as well.  Joann [name] specified that he would choose all aggressive measures in attempts to prolong [name]'s life.  Due to Joann [name]'s obvious distress and his need to continue driving to [facility]- further goals of care discussion deferred until Joann [name] can be present at hospital- likely tomorrow.   Primary Decision Maker NEXT OF KIN- spouse Joann [name]    SUMMARY OF RECOMMENDATIONS -Patient with significant hepatic failure in setting of ETOH hepatitis with complicating factors of hepatic encephalopathy refractory to lactulose with continued increase in ammonia levels, abdominal ascites, hypoalbuminemia, mental status requiring intubation, coagulopathy with GI bleeding and epistaxis- poor prognosis for overall survival  -Continue full scope care, full code -PMT will followup with Joann [name] for continued [place] discussion when he arrives in town- likely tomorrow    Code Status/Advance Care Planning:  Full code   Additional Recommendations (Limitations, Scope, Preferences):  Full Scope Treatment  Prognosis:    Unable to determine  Discharge Planning: To Be Determined  Primary Diagnoses: Present on Admission: . Hepatic encephalopathy ([place]) . Alcohol dependence with withdrawal, unspecified ([place]) . Alcoholic hepatitis without ascites . Thrombocytopenia ([place]) . Hypokalemia due to inadequate potassium intake   I have reviewed the medical record,  interviewed the patient and family, and examined the patient. The following aspects are pertinent.  Past Medical History:  Diagnosis Date  . Alcohol abuse   . Anxiety   . Hypertension   . Liver disease    Social History   Socioeconomic History  . Marital status: Married    Spouse name: Not on file  . Number of children: Not on file  . Years of education: Not on file  . Highest education level: Not on file    Occupational History  . Occupation: nurse  Tobacco Use  . Smoking status: Never Smoker  . Smokeless tobacco: Never Used  Substance and Sexual Activity  . Alcohol use: Yes    Comment: "I just do it"  . Drug use: Never  . Sexual activity: Yes  Other Topics Concern  . Not on file  Social History Narrative  . Not on file   Social Determinants of Health   Financial Resource Strain:   . Difficulty of Paying Living Expenses:   Food Insecurity:   . Worried About Charity fundraiser in the Last Year:   . Arboriculturist in the Last Year:   Transportation Needs:   . Film/video editor (Medical):   Marland Kitchen Lack of Transportation (Non-Medical):   Physical Activity:   . Days of Exercise per Week:   . Minutes of Exercise per Session:   Stress:   . Feeling of Stress :   Social Connections:   . Frequency of Communication with Friends and Family:   . Frequency of Social Gatherings with Friends and Family:   . Attends Religious Services:   . Active Member of Clubs or Organizations:   . Attends Archivist Meetings:   Marland Kitchen Marital Status:    Family History  Problem Relation Age of Onset  . Healthy Mother   . Alcohol abuse Mother        quit in her 77s  . Alcohol abuse Father   . Diabetes Father   . Healthy Brother   . Hypertension Maternal Grandmother   . Hypertension Maternal Grandfather   . Healthy Brother   . Healthy Brother    Scheduled Meds: . chlorhexidine gluconate (MEDLINE KIT)  15 mL Mouth Rinse BID  . Chlorhexidine Gluconate Cloth  6 each Topical Daily  . docusate  100 mg Per Tube BID  . folic acid  1 mg Per Tube Daily  . free water  200 mL Per Tube Q4H  . lactulose  30 g Per Tube Q2H  . mouth rinse  15 mL Mouth Rinse 10 times per day  . midazolam      . midodrine  5 mg Per Tube TID WC  . multivitamin with minerals  1 tablet Per Tube Daily  . oxymetazoline  1 spray Right Nare BID  . pantoprazole sodium  40 mg Per Tube Daily  . polyethylene glycol  17 g  Oral Daily  . prednisoLONE  40 mg Per Tube Daily  . propofol      . rifaximin  550 mg Per Tube BID  . rocuronium bromide      . sodium chloride flush  3 mL Intravenous Q12H  . sterile water (preservative free)      . thiamine injection  100 mg Intravenous Daily  . vecuronium      . zinc sulfate  220 mg Per Tube Daily   Continuous Infusions: . sodium chloride Stopped (12/31/19 0736)  . albumin human 25 g (  12/31/19 0523)  . dexmedetomidine (PRECEDEX) IV infusion 0.6 mcg/kg/hr (12/31/19 0736)  . feeding supplement (OSMOLITE 1.5 CAL) 35 mL/hr at 12/31/19 0000  . phytonadione (VITAMIN K) IV 10 mg (12/30/19 1607)  . piperacillin-tazobactam (ZOSYN)  IV 3.375 g (12/31/19 0914)  . vancomycin     PRN Meds:.albuterol, fentaNYL (SUBLIMAZE) injection, fentaNYL (SUBLIMAZE) injection, ondansetron **OR** ondansetron (ZOFRAN) IV, sodium chloride flush Medications Prior to Admission:  Prior to Admission medications   Medication Sig Start Date End Date Taking? Authorizing Provider  hydrOXYzine (ATARAX/VISTARIL) 25 MG tablet Take 25 mg by mouth every 6 (six) hours as needed for anxiety.   Yes [provider]  Multiple Vitamins-Minerals (MULTIVITAMIN WITH MINERALS) tablet Take 1 tablet by mouth daily.   Yes [provider]  topiramate (TOPAMAX) 200 MG tablet Take 200 mg by mouth 2 (two) times daily. 10/09/19  Yes [provider]  disulfiram (ANTABUSE) 250 MG tablet Take 250 mg by mouth daily.    [provider]  folic acid (FOLVITE) 1 MG tablet Take 1 mg by mouth daily.    [provider]  Multiple Vitamin (MULTIVITAMIN WITH MINERALS) TABS tablet Take 1 tablet by mouth 2 (two) times a week.     [provider]  traZODone (DESYREL) 50 MG tablet Take 50 mg by mouth at bedtime.    [provider]  venlafaxine (EFFEXOR) 75 MG tablet Take 75 mg by mouth daily.    [provider]   No Known Allergies Review of Systems  Unable to perform  ROS: Mental status change    Physical Exam Vitals and nursing note reviewed.  Constitutional:      Appearance: She is ill-appearing and toxic-appearing.  HENT:     Nose:     Comments: epistaxis Cardiovascular:     Rate and Rhythm: Normal rate and regular rhythm.  Abdominal:     General: There is distension.  Neurological:     Comments: Nonresponsive     Vital Signs: BP 92/63   Pulse 80   Temp 98.5 F (36.9 C) (Oral)   Resp 20   Ht 5' 4"  (1.626 m)   Wt 63.9 kg   SpO2 98%   BMI 24.18 kg/m  Pain Scale: CPOT   Pain Score: 0-No pain   SpO2: SpO2: 98 % O2 Device:SpO2: 98 % O2 Flow Rate: .O2 Flow Rate (L/min): 2 L/min  IO: Intake/output summary:   Intake/Output Summary (Last 24 hours) at 12/31/2019 1045 Last data filed at 12/31/2019 0736 Gross per 24 hour  Intake 709.77 ml  Output 2500 ml  Net -1790.23 ml    LBM: Last BM Date: 12/30/19 Baseline Weight: Weight: 62.8 kg Most recent weight: Weight: 63.9 kg     Palliative Assessment/Data: PPS: 10%     Thank you for this consult. Palliative medicine will continue to follow and assist as needed.   Time In: 1000 Time Out: 1130 Time Total: 90 minutes Greater than 50%  of this time was spent counseling and coordinating care related to the above assessment and plan.  Signed by: Mariana Kaufman, AGNP-C Palliative Medicine    Please contact Palliative Medicine Team phone at [phone] for questions and concerns.  For individual provider: See Shea Evans

## 2019-12-31 NOTE — Progress Notes (Addendum)
Daily Rounding Note  12/31/2019, 11:49 AM  LOS: 7 days   SUBJECTIVE:   Chief complaint: Severe alcoholic hepatitis, hepatic encephalopathy. Patient developed epistaxis last night, staff unable to suction the patient because of clenched teeth.  PCCM consulted and and pt intubated, placed on vent to protect her airway. The right nare was packed with Merocel.  Bleeding appears to have stopped. Had EEG which suggests severe, diffuse encephalopathy with no specific etiology.  There were no seizures or Ellipta form discharges. Putting out mostly watery stool with some slight amount of brown sediment into the Flexi-Seal.  1 L recorded yesterday. Urine is dark/bilious, output of 1.55 L yesterday.  OBJECTIVE:         Vital signs in last 24 hours:    Temp:  [96.9 F (36.1 C)-99.2 F (37.3 C)] 98.5 F (36.9 C) (04/22 1115) Pulse Rate:  [75-118] 90 (04/22 1115) Resp:  [19-40] 36 (04/22 1115) BP: (92-151)/(60-132) 127/76 (04/22 1115) SpO2:  [93 %-100 %] 99 % (04/22 1115) FiO2 (%):  [40 %-100 %] 40 % (04/22 1109) Weight:  [63.9 kg] 63.9 kg (04/22 0650) Last BM Date: 12/30/19 Filed Weights   12/27/19 1145 12/29/19 0400 12/31/19 0650  Weight: 62.8 kg 60.7 kg 63.9 kg   General: Acutely ill on vent.  Icteric sclera with eyes rolling to the top of her head.  Unresponsive. Heart: RRR. Chest: On vent.  No labored breathing. Abdomen: Soft, bowel sounds normal but hypoactive.  No wincing or pain type response to palpation. Extremities: Sarcopenia.  No edema. Neuro/Psych: Sedated on vent.  No spontaneous limb movement observed  Intake/Output from previous day: 04/21 0701 - 04/22 0700 In: 318.3 [NG/GT:268.3; IV Piggyback:50] Out: 2500 [Urine:1550; Stool:950]  Intake/Output this shift: Total I/O In: 391.5 [I.V.:4.7; IV Piggyback:386.8] Out: 1000 [Stool:1000]  Lab Results: Recent Labs    12/29/19 0506 12/29/19 0506 12/30/19 0308  12/31/19 0426 12/31/19 0826  WBC 19.7*  --  25.3* 33.6*  --   HGB 8.2*   < > 10.7* 9.3* 9.9*  HCT 26.2*   < > 33.5* 29.3* 29.0*  PLT 55*  --  57* 52*  --    < > = values in this interval not displayed.   BMET Recent Labs    12/29/19 0506 12/29/19 0506 12/30/19 0308 12/31/19 0426 12/31/19 0826  NA 136   < > 139 150* 158*  K 3.7   < > 4.2 2.9* 3.4*  CL 109  --  110 117*  --   CO2 20*  --  18* 23  --   GLUCOSE 103*  --  125* 93  --   BUN <5*  --  8 11  --   CREATININE 1.08*  --  1.09* 0.86  --   CALCIUM 8.1*  --  8.8* 9.2  --    < > = values in this interval not displayed.   LFT Recent Labs    12/29/19 0506 12/30/19 0308 12/31/19 0426  PROT 6.3* 7.9 7.5  ALBUMIN 1.1* 1.6* 2.3*  AST 118* 193* 279*  ALT 25 32 39  ALKPHOS 164* 201* 178*  BILITOT 18.1* 22.9* 24.7*  BILIDIR 9.9*  --  17.1*  IBILI 8.2*  --  7.6*   PT/INR Recent Labs    12/29/19 0506 12/31/19 0426  LABPROT 21.8* 21.2*  INR 1.9* 1.8*   Hepatitis Panel No results for input(s): HEPBSAG, HCVAB, HEPAIGM, HEPBIGM in the last 72 hours.  Studies/Results:  CT HEAD WO CONTRAST  Result Date: 12/30/2019 CLINICAL DATA:  39 year old female with altered mental status. EXAM: CT HEAD WITHOUT CONTRAST TECHNIQUE: Contiguous axial images were obtained from the base of the skull through the vertex without intravenous contrast. COMPARISON:  Head CT dated 12/24/2019. FINDINGS: Brain: The ventricles and sulci appropriate size for patient's age. The gray-white matter discrimination is preserved. There is no acute intracranial hemorrhage. No mass effect or midline shift. No extra-axial fluid collection. Vascular: No hyperdense vessel or unexpected calcification. Skull: Normal. Negative for fracture or focal lesion. Sinuses/Orbits: The visualized paranasal sinuses and mastoid air cells are clear. Partially visualized nasal tube. Other: None IMPRESSION: Unremarkable noncontrast CT of the brain. Electronically Signed   By: Anner Crete M.D.   On: 12/30/2019 18:52   DG CHEST PORT 1 VIEW  Result Date: 12/31/2019 CLINICAL DATA:  Intubation. EXAM: PORTABLE CHEST 1 VIEW COMPARISON:  12/30/2019. FINDINGS: Endotracheal tube tip 5 mm above the lower portion of the carina. Proximal repositioning of approximately 2 cm should be considered. Feeding tube in stable position. Heart size normal. Progressive bibasilar atelectasis/infiltrates. Bilateral interstitial prominence again noted suggesting interstitial edema and/or pneumonitis. No pleural effusion or pneumothorax. No acute bony abnormality identified. IMPRESSION: 1. Endotracheal tube tip 5 mm above the lower portion of the carina. Proximal repositioning of approximately 2 cm should be considered. Feeding tube in stable position. 2. Progressive bibasilar atelectasis/infiltrates. Bilateral interstitial prominence again noted suggesting interstitial edema and/or pneumonitis. These results will be called to the ordering clinician or representative by the Radiologist Assistant, and communication documented in the PACS or [employer]. Electronically Signed   By: Marcello Moores  Register   On: 12/31/2019 06:31   DG Chest Port 1V same Day  Result Date: 12/30/2019 CLINICAL DATA:  39 year old female with altered mental status. EXAM: PORTABLE CHEST 1 VIEW COMPARISON:  Chest radiograph dated 12/25/2019. FINDINGS: Feeding tube with tip to the right of the spine in the distal stomach. Overall improved aeration of the lungs compared to the prior radiograph. Mild interstitial nodularity. No focal consolidation, pleural effusion, pneumothorax. Six 60 stable cardiac silhouette. No acute osseous pathology. IMPRESSION: 1. No focal consolidation. 2. Feeding tube in the distal stomach. Electronically Signed   By: Anner Crete M.D.   On: 12/30/2019 18:30   EEG adult  Result Date: 12/31/2019 Lora Havens, MD     12/31/2019  9:47 AM Patient Name: Joann [name] MRN: [id number] Epilepsy Attending: Lora Havens Referring Physician/Provider: Dr Oren Binet Date: 12/30/2019 Duration: 22.25 mins Patient history: 39 yo F with h/o etoh dependence and seizures admitted for ams. EEG to evaluate for seizure.  Level of alertness: lethargic AEDs during EEG study: None Technical aspects: This EEG study was done with scalp electrodes positioned according to the 10-20 International system of electrode placement. Electrical activity was acquired at a sampling rate of 500Hz and reviewed with a high frequency filter of 70Hz and a low frequency filter of 1Hz. EEG data were recorded continuously and digitally stored. DESCRIPTION: EEG showed continuous generalized low amplitude 2-3hz delta slowing. Hyperventilation and photic stimulation were not performed. EEG was technically difficult due to significant myogenic artifact. ABNORMALITY - Continuous slow, generalized IMPRESSION: This technically difficult study is suggestive of severe diffuse encephalopathy, non specific to etiology. No seizures or epileptiform discharges were seen throughout the recording. Priyanka Barbra Sarks   Scheduled Meds: . chlorhexidine gluconate (MEDLINE KIT)  15 mL Mouth Rinse BID  . Chlorhexidine Gluconate Cloth  6 each Topical Daily  .  docusate  100 mg Per Tube BID  . feeding supplement (PRO-STAT SUGAR FREE 64)  30 mL Per Tube Daily  . feeding supplement (VITAL 1.5 CAL)  1,000 mL Per Tube Q24H  . folic acid  1 mg Per Tube Daily  . free water  200 mL Per Tube Q4H  . lactulose  30 g Per Tube Q8H  . mouth rinse  15 mL Mouth Rinse 10 times per day  . midazolam      . midodrine  5 mg Per Tube TID WC  . multivitamin with minerals  1 tablet Per Tube Daily  . oxymetazoline  1 spray Right Nare BID  . pantoprazole sodium  40 mg Per Tube Daily  . polyethylene glycol  17 g Oral Daily  . prednisoLONE  40 mg Per Tube Daily  . propofol      . rifaximin  550 mg Per Tube BID  . rocuronium bromide      . sodium chloride flush  3 mL Intravenous Q12H  .  sterile water (preservative free)      . [START ON 01/01/2020] thiamine  100 mg Per Tube Daily  . vecuronium      . zinc sulfate  220 mg Per Tube Daily   Continuous Infusions: . sodium chloride Stopped (12/31/19 0736)  . albumin human 25 g (12/31/19 0523)  . dexmedetomidine (PRECEDEX) IV infusion 0.6 mcg/kg/hr (12/31/19 0736)  . phytonadione (VITAMIN K) IV 10 mg (12/31/19 1130)  . piperacillin-tazobactam (ZOSYN)  IV 3.375 g (12/31/19 0914)  . vancomycin     PRN Meds:.albuterol, fentaNYL (SUBLIMAZE) injection, fentaNYL (SUBLIMAZE) injection, ondansetron **OR** ondansetron (ZOFRAN) IV, sodium chloride flush   ASSESMENT:   *   Epistaxis  *     S/p intubation to protect airway in setting of epistaxis and ongoing encephalopathy.  Chest x-ray showing progression bibasilar ATX/infiltrates and suggest edema and/or pneumonitis. Vancomycin, Zosyn initiated 4/22.  *    Hepatic encephalopathy.  Ammonia level rising and now stable despite copious doses of lactulose, rifaximin, zinc. Now receiving Precedex.  Lactulose per NG tube continues.  *     Alcoholic hepatitis.  Day 6 prednisolone.  Midodrine in place.  Sandostatin stopped 4/19.  Overall discriminant function improved from admission level..  T bili, alk phos, AST rising.  WBCs rising 25.3 >> 33.6. Albumin, 25% / 25 g every 8 hours day 2.    *    Anemia.  Macrocytic.  2 PRBCs on 4/17.  Hgb 10.7 >> 9.3 >> 9.9 over the last 24 hours.  *     Coagulopathy.  INR overall improved 3.4 >> 1.8.  Day 2/3 IV vitamin K.  *   Thrombocytopenia platelets relatively stable in the 50s.  *      Minimal ascites on 2 separate ultrasounds.  Ordered paracentesis to assess for SBP.  *    Hypernatremia.  *   Hypokalemia, improved.  *    Malnutrition in setting of chronic as well as acute illness..  Tube feeds vital 1.5 at 40 mL/hour, Protostat 30 mL daily via core track   PLAN   *    Continue supportive care.  Follow c-Met, PT/INR, CBC.  *   IR  paracentesis ordered yesterday.    *   ? Ultrasound abdomen w doppler studies to r/o Budd-Chiari??  Ultrasound in 03/2019 showed patent PV with normal directional flow.    *    Her husband will return to the area either later today or  tomorrow. I would anticipate a goals of care consult with him.    Azucena Freed  12/31/2019, 11:49 AM Phone [phone]   Attending physician's note   I have reviewed the chart and examined the patient. I agree with the Advanced Practitioner's note, impression and recommendations.   Events from today noted  Epistaxis with aspiration with resp failure requiring intubation, septic shock on zosyn/vanc ETOH hepatitis with underlying liver cirrhosis on prednisolone - day 6 Hepatic failure with rising TB. MELD-Na 25. Not a transplant candidate d/t ongoing ETOH abuse. Hepatic encephalopathy  Gd2 eso varices coagulapathy Alcohol withdrawal  Plan: -Lactulose thru NG/rifaxamin/zinc -IV alb 25g IV Q8 -Continue midodrine -For now continue prednisolone. Will calc Lille score day 7 and determine need to continue or not. -Continue Vit K -Monitor and correct electrolytes. -Poor overall prognosis.   Carmell Austria, MD Velora Heckler Fabienne Bruns [phone].

## 2020-01-01 DIAGNOSIS — J9600 Acute respiratory failure, unspecified whether with hypoxia or hypercapnia: Secondary | ICD-10-CM

## 2020-01-01 DIAGNOSIS — Z515 Encounter for palliative care: Secondary | ICD-10-CM | POA: Diagnosis not present

## 2020-01-01 DIAGNOSIS — R17 Unspecified jaundice: Secondary | ICD-10-CM

## 2020-01-01 LAB — PREPARE PLATELET PHERESIS: Unit division: 0

## 2020-01-01 LAB — MAGNESIUM: Magnesium: 2.5 mg/dL — ABNORMAL HIGH (ref 1.7–2.4)

## 2020-01-01 LAB — COMPREHENSIVE METABOLIC PANEL
ALT: 38 U/L (ref 0–44)
AST: 225 U/L — ABNORMAL HIGH (ref 15–41)
Albumin: 2.9 g/dL — ABNORMAL LOW (ref 3.5–5.0)
Alkaline Phosphatase: 139 U/L — ABNORMAL HIGH (ref 38–126)
Anion gap: 10 (ref 5–15)
BUN: 11 mg/dL (ref 6–20)
CO2: 22 mmol/L (ref 22–32)
Calcium: 8.7 mg/dL — ABNORMAL LOW (ref 8.9–10.3)
Chloride: 121 mmol/L — ABNORMAL HIGH (ref 98–111)
Creatinine, Ser: 0.65 mg/dL (ref 0.44–1.00)
GFR calc Af Amer: >60 mL/min (ref >60)
GFR calc non Af Amer: >60 mL/min (ref >60)
Glucose, Bld: 132 mg/dL — ABNORMAL HIGH (ref 70–99)
Potassium: 3.2 mmol/L — ABNORMAL LOW (ref 3.5–5.1)
Sodium: 153 mmol/L — ABNORMAL HIGH (ref 135–145)
Total Bilirubin: 22.2 mg/dL (ref 0.3–1.2)
Total Protein: 6.9 g/dL (ref 6.5–8.1)

## 2020-01-01 LAB — CBC
HCT: 25.7 % — ABNORMAL LOW (ref 36.0–46.0)
Hemoglobin: 7.7 g/dL — ABNORMAL LOW (ref 12.0–15.0)
MCH: 35.3 pg — ABNORMAL HIGH (ref 26.0–34.0)
MCHC: 30 g/dL (ref 30.0–36.0)
MCV: 117.9 fL — ABNORMAL HIGH (ref 80.0–100.0)
Platelets: 29 10*3/uL — CL (ref 150–400)
RBC: 2.18 MIL/uL — ABNORMAL LOW (ref 3.87–5.11)
RDW: 28.3 % — ABNORMAL HIGH (ref 11.5–15.5)
WBC: 28.4 10*3/uL — ABNORMAL HIGH (ref 4.0–10.5)
nRBC: 3.9 % — ABNORMAL HIGH (ref 0.0–0.2)

## 2020-01-01 LAB — BPAM PLATELET PHERESIS
Blood Product Expiration Date: 202104222359
ISSUE DATE / TIME: 202104220831
Unit Type and Rh: 5100

## 2020-01-01 LAB — GLUCOSE, CAPILLARY
Glucose-Capillary: 105 mg/dL — ABNORMAL HIGH (ref 70–99)
Glucose-Capillary: 106 mg/dL — ABNORMAL HIGH (ref 70–99)
Glucose-Capillary: 110 mg/dL — ABNORMAL HIGH (ref 70–99)
Glucose-Capillary: 118 mg/dL — ABNORMAL HIGH (ref 70–99)
Glucose-Capillary: 128 mg/dL — ABNORMAL HIGH (ref 70–99)
Glucose-Capillary: 95 mg/dL (ref 70–99)

## 2020-01-01 LAB — PHOSPHORUS: Phosphorus: 2.1 mg/dL — ABNORMAL LOW (ref 2.5–4.6)

## 2020-01-01 MED ORDER — POTASSIUM PHOSPHATES 15 MMOLE/5ML IV SOLN
30.0000 mmol | Freq: Once | INTRAVENOUS | Status: AC
  Administered 2020-01-01: 11:00:00 30 mmol via INTRAVENOUS
  Filled 2020-01-01: qty 10

## 2020-01-01 MED ORDER — LORAZEPAM 2 MG/ML IJ SOLN
1.0000 mg | INTRAMUSCULAR | Status: DC | PRN
  Administered 2020-01-01 – 2020-01-04 (×14): 2 mg via INTRAVENOUS
  Administered 2020-01-04: 1 mg via INTRAVENOUS
  Filled 2020-01-01 (×16): qty 1

## 2020-01-01 MED ORDER — FOLIC ACID 1 MG PO TABS: 1.0000 mg | ORAL_TABLET | Freq: Every day | ORAL | Status: DC

## 2020-01-01 MED ORDER — ADULT MULTIVITAMIN W/MINERALS CH: 1.0000 | ORAL_TABLET | Freq: Every day | ORAL | Status: DC

## 2020-01-01 MED ORDER — ADULT MULTIVITAMIN LIQUID CH
15.0000 mL | Freq: Every day | ORAL | Status: DC
  Administered 2020-01-01 – 2020-01-06 (×6): 15 mL via ORAL
  Filled 2020-01-01 (×6): qty 15

## 2020-01-01 MED ORDER — THIAMINE HCL 100 MG PO TABS
100.0000 mg | ORAL_TABLET | Freq: Every day | ORAL | Status: DC
  Administered 2020-01-01 – 2020-01-08 (×8): 100 mg
  Filled 2020-01-01 (×8): qty 1

## 2020-01-01 MED ORDER — FOLIC ACID 1 MG PO TABS
1.0000 mg | ORAL_TABLET | Freq: Every day | ORAL | Status: DC
  Administered 2020-01-01 – 2020-01-08 (×8): 1 mg
  Filled 2020-01-01 (×8): qty 1

## 2020-01-01 MED ORDER — THIAMINE HCL 100 MG PO TABS: 100.0000 mg | ORAL_TABLET | Freq: Every day | ORAL | Status: DC

## 2020-01-01 MED ORDER — DEXMEDETOMIDINE HCL IN NACL 400 MCG/100ML IV SOLN
0.2000 ug/kg/h | INTRAVENOUS | Status: DC
  Administered 2020-01-01: 0.2 ug/kg/h via INTRAVENOUS
  Filled 2020-01-01: qty 100

## 2020-01-01 MED ORDER — POTASSIUM CHLORIDE 20 MEQ/15ML (10%) PO SOLN
30.0000 meq | ORAL | Status: DC
  Administered 2020-01-01: 30 meq
  Filled 2020-01-01: qty 30

## 2020-01-01 NOTE — Progress Notes (Signed)
eLink Physician-Brief Progress Note Patient Name: JUDITH DEMPS DOB: 1980/10/15 MRN: [id number]   Date of Service  01/01/2020  HPI/Events of Note  ETOH withdrawal symptoms, Pt is also shivering slightly, she is afebrile.  eICU Interventions  CIWA protocol ordered, warm blanket requested for patient.        Kerry Kass Clarann Helvey 01/01/2020, 3:18 AM

## 2020-01-01 NOTE — Progress Notes (Signed)
eLink Physician-Brief Progress Note Patient Name: Joann [name] DOB: October 25, 1980 MRN: [id number]   Date of Service  01/01/2020  HPI/Events of Note  Pt with significant bradycardia on Precedex.  eICU Interventions  Precedex discontinued.        Kerry Kass Dontez Hauss 01/01/2020, 5:16 AM

## 2020-01-01 NOTE — Progress Notes (Signed)
CRITICAL VALUE ALERT  Critical Value:  Platelets 29  Date & Time Notied:  01/01/20 @ 8325  Provider Notified: Warren Lacy  Orders Received/Actions taken: TBD

## 2020-01-01 NOTE — Progress Notes (Signed)
NAME:  Joann [name], MRN:  [id number], DOB:  1981-07-18, LOS: 8 ADMISSION DATE:  12/23/2019, CONSULTATION DATE:  12/31/2019 REFERRING MD:  Dr. Myna Hidalgo, CHIEF COMPLAINT:  AMS  Brief History   39 yoF with ETOH abuse and alcoholic hepatitis presenting with worsening mental status found to have acute hepatic encephalopathy, anemia, AKI, and coagulapathy.  GI following.  Mental status has waxed/ waned however developed bleeding from suspected nares 4/22 and with ongoing low GCS, PCCM consulted over concerns of airway protection. Patient was intubated and transferred to ICU.     History of present illness   HPI obtained from medical chart review as patient is encephalopathic.    39 year old female with hx of HTN, ETOH abuse, alcohol pancreatitis, and withdrawal seizures admitted to [facility] on 4/14 with worsening mental status after binge drinking, found to have elevated LFTs, and concerns for active GI bleeding with anemia.  GI consulted.  Underwent EGD on 4/18 which showed non bleeding grade 2 varices.  Currently on prednisolone (started 4/17), lactulose, rifaximin, and midodrine.  Sandostatin stopped 4/19.    She has been empirically on ceftriaxone for leukocytosis with blood cultures with no growth to date.  Her AKI and LFTs have improved, however worsening ammonia levels despite increased dosing of lactulose.  Mental status has been waxing and wanning, however she had a clear CXR 4/21 and reassuring ABG.  Per RN, patient unresponsive throughout the night but developed bleeding from her nares.  Unable to be suctioned orally given clenched teeth.  PCCM consulted given concern for airway protection and active bleeding. Patient was intubated and transferred to ICU. Merocel sponge placed in right naris to aid in hemostasis.   Past Medical History  HTN, ETOH abuse, alcohol pancreatitis, withdrawal seizures  Significant Hospital Events   4/14 Admitted to [facility] 4/18 EGD 4/22 Admitted to ICU  Consults:   GI PMT  Procedures:  4/18 EGD 4/22 EEG   Significant Diagnostic Tests:  4/15 [facility] >> neg 4/18 EGD >> grade 2 varices, large amount of food in the stomach 4/19 limited abd [place] >>  Minimal abdominal ascites most prominent in the right upper Quadrant. 4/21 >> CXR and CTH unremarkable 4/22 >> CXR reveals:  Progressive bibasilar atelectasis/infiltrates. Bilateral interstitial prominence again noted suggesting interstitial edema and/or pneumonitis.  Micro Data:  4/15 SARS 2/ Flu A/B >> neg 4/17 BCx 2 >> NGTD @ 5 days 4/22 >> Tracheal Aspirate, rare budding yeast  Antimicrobials:  4/14 ceftriaxone >> 4/22 4/22 zosyn >> 4/22 vanc >>4/23  Interim history/subjective:  ICU course over the past 24 hrs is noted for continued AMS, despite adequate stools with Lactulose TID. Platelets continue to downtrend. Was initially tachycardic on 4/22 but became bradycardic on precedex overnight, precedex d/c'd.   Palliative care consulted and husband arrives into town today. Will discuss GOC.   Objective   Blood pressure 108/60, pulse 99, temperature 97.7 F (36.5 C), temperature source Axillary, resp. rate (!) 26, height 5' 4"  (1.626 m), weight 63 kg, SpO2 100 %.    Vent Mode: PSV;CPAP FiO2 (%):  [40 %-100 %] 40 % Set Rate:  [20 bmp] 20 bmp Vt Set:  [470 mL] 470 mL PEEP:  [5 cmH20] 5 cmH20 Pressure Support:  [15 cmH20] 15 cmH20 Plateau Pressure:  [15 cmH20-20 cmH20] 19 cmH20   Intake/Output Summary (Last 24 hours) at 01/01/2020 0804 Last data filed at 01/01/2020 0600 Gross per 24 hour  Intake 4089.58 ml  Output 3450 ml  Net 639.58  ml   Filed Weights   12/29/19 0400 12/31/19 0650 01/01/20 0343  Weight: 60.7 kg 63.9 kg 63 kg   Examination: General: Critically ill young woman, lying in bed on ventilator.  Neuro:  Eyes are open, but unresponsive, does not blink to threat or respond to noxious stimuli HEENT:  Scleral icterus, Dried blood around nasal packing, right naris appears  hemostatic, mild oral secretions with bite block in place. CV: RRR, S1/S2 heart sounds.  PULM: MV breath sounds, NWOB on pressure support.  GI: distended, round  Extremities:warm and dry, no peripheral edema  Skin: generalized icterus   Resolved Hospital Problem list   AKI -resolved  Assessment & Plan:   Acute hypoxic respiratory failure in the setting of nose bleed and encephalopathy with presumed aspiration: CXR 4/22 + for bibasilar atelectasis/infiltrates - On MV w/pressure support - Will wean, pending improvement in mentation. - d/c'd precedex 2/2 bradycardia   - PPI  - broad spec ABx - Afrin - Albuterol q4 prn - Fentanyl and Ativan prn  Encephalopathy- mixed hepatic +/- ETOH withdrawal: CTH unremarkable 4/15, EEG 4/22 reveals no seizure like activity, Noted for tongue biting 4/21 am - Continue lactulose  - Continue rifaximin - Continue thiamine/ folate - Oral bite block in place  Leukocytosis: - stable trend, despite steroids (started 4/17), marked jump 4/22, now trending downward - broaden abx to zosyn for now - d/c'd vanc - follow BC, ngtd x 4 days  - trend WBC/ fever curve   Alcohol Hepatitis:  - initial MELD score 31/33 on admit, with mortality rate 52.6%, now MELD on 26 - per GI recs:   - continuing prednisolone   - midodrine  - 4/24 am Ammonia level - octreotide stopped 4/19 - continue to trend LFTs w/CMP tmrw - minimal ascites on 4/19 [place]  - PCCM to perform paracentesis, if needed - d/c'd Albumin    Macrocytic anemia:   - trending CBC, transfuse for Hgb <7  Coagulopathy/ thrombocytopenia: secondary to alcoholic hepatitis/ liver cirrhosis. Acute bleeding from R naris now stable. Platelets continuing to downtrend. - Replace Plts <10, or if bleeding reoccurs - trend CBC/ coags   Hypokalemia/  Hypernatremia/hypophosphotemia  - trend UOP/ daily renal panel - Replete potassium/phos - Continue free water for sodium >150   Best practice:  Diet: Tube  Feeds Pain/Anxiety/Delirium protocol (if indicated): prn fentanyl, precedex if needed VAP protocol (if indicated): yes DVT prophylaxis: SCDs only  GI prophylaxis: PPI Glucose control: CBG q 4 Mobility: BR Code Status: Full  Family Communication: Supposedly husband coming to ICU today, will update. Palliative on board regarding discussion of [place] Disposition:ICU   Labs   CBC: Recent Labs  Lab 12/28/19 0424 12/28/19 0424 12/29/19 0506 12/29/19 0506 12/30/19 0308 12/30/19 0308 12/31/19 0426 12/31/19 0826 12/31/19 1311 12/31/19 2322 01/01/20 0348  WBC 21.5*  --  19.7*  --  25.3*  --  33.6*  --   --   --  28.4*  HGB 9.2*   < > 8.2*   < > 10.7*   < > 9.3* 9.9* 7.9* 7.1* 7.7*  HCT 28.2*   < > 26.2*   < > 33.5*   < > 29.3* 29.0* 25.2* 22.9* 25.7*  MCV 106.0*  --  108.3*  --  109.5*  --  111.4*  --   --   --  117.9*  PLT 67*  --  55*  --  57*  --  52*  --   --   --  29*   < > = values in this interval not displayed.    Basic Metabolic Panel: Recent Labs  Lab 12/26/19 1044 12/27/19 0352 12/28/19 0424 12/28/19 0424 12/29/19 0506 12/30/19 0308 12/31/19 0426 12/31/19 0826 01/01/20 0348  NA 130*   < > 133*   < > 136 139 150* 158* 153*  K 4.1   < > 3.5   < > 3.7 4.2 2.9* 3.4* 3.2*  CL 104   < > 105  --  109 110 117*  --  121*  CO2 17*   < > 18*  --  20* 18* 23  --  22  GLUCOSE 106*   < > 107*  --  103* 125* 93  --  132*  BUN <5*   < > <5*  --  <5* 8 11  --  11  CREATININE 1.48*   < > 1.06*  --  1.08* 1.09* 0.86  --  0.65  CALCIUM 7.5*   < > 8.1*  --  8.1* 8.8* 9.2  --  8.7*  MG 2.0  --  2.1  --   --   --  2.7*  --  2.5*  PHOS  --   --   --   --   --   --  2.8  --  2.1*   < > = values in this interval not displayed.   GFR: Estimated Creatinine Clearance: 80.7 mL/min (by C-G formula based on SCr of 0.65 mg/dL). Recent Labs  Lab 12/29/19 0506 12/30/19 0308 12/31/19 0426 01/01/20 0348  WBC 19.7* 25.3* 33.6* 28.4*    Liver Function Tests: Recent Labs  Lab  12/28/19 0424 12/29/19 0506 12/30/19 0308 12/31/19 0426 01/01/20 0348  AST 128* 118* 193* 279* 225*  ALT 25 25 32 39 38  ALKPHOS 164* 164* 201* 178* 139*  BILITOT 20.5* 18.1* 22.9* 24.7* 22.2*  PROT 6.8 6.3* 7.9 7.5 6.9  ALBUMIN 1.1* 1.1* 1.6* 2.3* 2.9*   No results for input(s): LIPASE, AMYLASE in the last 168 hours. Recent Labs  Lab 12/27/19 0352 12/28/19 0424 12/29/19 0506 12/30/19 0308 12/31/19 0426  AMMONIA 77* 142* 158* 198* 197*    ABG    Component Value Date/Time   PHART 7.448 12/31/2019 0826   PCO2ART 38.2 12/31/2019 0826   PO2ART 346 (H) 12/31/2019 0826   HCO3 26.3 12/31/2019 0826   TCO2 27 12/31/2019 0826   ACIDBASEDEF 2.5 (H) 12/30/2019 1655   O2SAT 100.0 12/31/2019 0826     Coagulation Profile: Recent Labs  Lab 12/27/19 0352 12/28/19 0424 12/29/19 0506 12/31/19 0426 12/31/19 1311  INR 3.4* 2.6* 1.9* 1.8* 2.0*    Cardiac Enzymes: No results for input(s): CKTOTAL, CKMB, CKMBINDEX, TROPONINI in the last 168 hours.  HbA1C: Hgb A1c MFr Bld  Date/Time Value Ref Range Status  09/29/2018 03:23 PM 4.8 4.8 - 5.6 % Final    Comment:             Prediabetes: 5.7 - 6.4          Diabetes: >6.4          Glycemic control for adults with diabetes: <7.0     CBG: Recent Labs  Lab 12/31/19 1507 12/31/19 1916 12/31/19 2312 01/01/20 0311 01/01/20 0738  GLUCAP 145* 124* 123* 105* 95    Review of Systems:   Unable to obtain due to encephalopathy   Past Medical History  She,  has a past medical history of Alcohol abuse, Anxiety, Hypertension, and Liver disease.  Surgical History    Past Surgical History:  Procedure Laterality Date  . ECTOPIC PREGNANCY SURGERY    . ESOPHAGOGASTRODUODENOSCOPY (EGD) WITH PROPOFOL N/A 12/27/2019   Procedure: ESOPHAGOGASTRODUODENOSCOPY (EGD) WITH PROPOFOL;  Surgeon: Carol Ada, MD;  Location: [facility];  Service: Endoscopy;  Laterality: N/A;     Social History   reports that she has never smoked. She has  never used smokeless tobacco. She reports current alcohol use. She reports that she does not use drugs.   Family History   Her family history includes Alcohol abuse in her father and mother; Diabetes in her father; Healthy in her brother, brother, brother, and mother; Hypertension in her maternal grandfather and maternal grandmother.   Allergies No Known Allergies   Home Medications  Prior to Admission medications   Medication Sig Start Date End Date Taking? Authorizing Provider  hydrOXYzine (ATARAX/VISTARIL) 25 MG tablet Take 25 mg by mouth every 6 (six) hours as needed for anxiety.   Yes [provider]  Multiple Vitamins-Minerals (MULTIVITAMIN WITH MINERALS) tablet Take 1 tablet by mouth daily.   Yes [provider]  topiramate (TOPAMAX) 200 MG tablet Take 200 mg by mouth 2 (two) times daily. 10/09/19  Yes [provider]  disulfiram (ANTABUSE) 250 MG tablet Take 250 mg by mouth daily.    [provider]  folic acid (FOLVITE) 1 MG tablet Take 1 mg by mouth daily.    [provider]  Multiple Vitamin (MULTIVITAMIN WITH MINERALS) TABS tablet Take 1 tablet by mouth 2 (two) times a week.     [provider]  traZODone (DESYREL) 50 MG tablet Take 50 mg by mouth at bedtime.    [provider]  venlafaxine (EFFEXOR) 75 MG tablet Take 75 mg by mouth daily.    [provider]    This note was initiated by Carolan Shiver. Meda Coffee, MS4

## 2020-01-01 NOTE — Progress Notes (Addendum)
Daily Rounding Note  01/01/2020, 10:16 AM  LOS: 8 days   SUBJECTIVE:   Chief complaint:        Remains intubated on vent. Precedex discontinued in setting of bradycardia.. 1050 ml urine output yesterday 2.4 liter watery stool output yesterday.   Right nasal packing remains in place, no recurrent epistaxis. Platelets continue to decline, 20 9K now.  OBJECTIVE:         Vital signs in last 24 hours:    Temp:  [97.5 F (36.4 C)-98.5 F (36.9 C)] 97.7 F (36.5 C) (04/23 0734) Pulse Rate:  [48-102] 99 (04/23 0740) Resp:  [19-36] 26 (04/23 0740) BP: (83-153)/(56-112) 108/60 (04/23 0740) SpO2:  [97 %-100 %] 100 % (04/23 0740) FiO2 (%):  [40 %] 40 % (04/23 0940) Weight:  [63 kg] 63 kg (04/23 0343) Last BM Date: 12/30/19 Filed Weights   12/29/19 0400 12/31/19 0650 01/01/20 0343  Weight: 60.7 kg 63.9 kg 63 kg   General: Intubated, on vent.  Not responding to voice. Heart: RRR. Chest: Nonlabored breathing on vent. Abdomen: Protuberant but soft.  Not tender.  Bowel sounds hypoactive.  Watery brown stool in Flexi-Seal bag. Extremities: Upper extremity and lower extremity edema Neuro/Psych: Comatose.  Intake/Output from previous day: 04/22 0701 - 04/23 0700 In: 4481.1 [I.V.:389.8; NG/GT:1700.6; IV Piggyback:2390.7] Out: 3450 [Urine:1050; JJOAC:1660]  Intake/Output this shift: No intake/output data recorded.  Lab Results: Recent Labs    12/30/19 0308 12/30/19 0308 12/31/19 0426 12/31/19 0826 12/31/19 1311 12/31/19 2322 01/01/20 0348  WBC 25.3*  --  33.6*  --   --   --  28.4*  HGB 10.7*   < > 9.3*   < > 7.9* 7.1* 7.7*  HCT 33.5*   < > 29.3*   < > 25.2* 22.9* 25.7*  PLT 57*  --  52*  --   --   --  29*   < > = values in this interval not displayed.   BMET Recent Labs    12/30/19 0308 12/30/19 0308 12/31/19 0426 12/31/19 0826 01/01/20 0348  NA 139   < > 150* 158* 153*  K 4.2   < > 2.9* 3.4* 3.2*  CL 110   --  117*  --  121*  CO2 18*  --  23  --  22  GLUCOSE 125*  --  93  --  132*  BUN 8  --  11  --  11  CREATININE 1.09*  --  0.86  --  0.65  CALCIUM 8.8*  --  9.2  --  8.7*   < > = values in this interval not displayed.   LFT Recent Labs    12/30/19 0308 12/31/19 0426 01/01/20 0348  PROT 7.9 7.5 6.9  ALBUMIN 1.6* 2.3* 2.9*  AST 193* 279* 225*  ALT 32 39 38  ALKPHOS 201* 178* 139*  BILITOT 22.9* 24.7* 22.2*  BILIDIR  --  17.1*  --   IBILI  --  7.6*  --    PT/INR Recent Labs    12/31/19 0426 12/31/19 1311  LABPROT 21.2* 22.6*  INR 1.8* 2.0*   Hepatitis Panel No results for input(s): HEPBSAG, HCVAB, HEPAIGM, HEPBIGM in the last 72 hours.  Studies/Results: CT HEAD WO CONTRAST  Result Date: 12/30/2019 CLINICAL DATA:  39 year old female with altered mental status. EXAM: CT HEAD WITHOUT CONTRAST TECHNIQUE: Contiguous axial images were obtained from the base of the skull through the vertex without intravenous contrast. COMPARISON:  Head CT dated 12/24/2019. FINDINGS: Brain: The ventricles and sulci appropriate size for patient's age. The gray-white matter discrimination is preserved. There is no acute intracranial hemorrhage. No mass effect or midline shift. No extra-axial fluid collection. Vascular: No hyperdense vessel or unexpected calcification. Skull: Normal. Negative for fracture or focal lesion. Sinuses/Orbits: The visualized paranasal sinuses and mastoid air cells are clear. Partially visualized nasal tube. Other: None IMPRESSION: Unremarkable noncontrast CT of the brain. Electronically Signed   By: Anner Crete M.D.   On: 12/30/2019 18:52   DG CHEST PORT 1 VIEW  Result Date: 12/31/2019 CLINICAL DATA:  Intubation. EXAM: PORTABLE CHEST 1 VIEW COMPARISON:  12/30/2019. FINDINGS: Endotracheal tube tip 5 mm above the lower portion of the carina. Proximal repositioning of approximately 2 cm should be considered. Feeding tube in stable position. Heart size normal. Progressive  bibasilar atelectasis/infiltrates. Bilateral interstitial prominence again noted suggesting interstitial edema and/or pneumonitis. No pleural effusion or pneumothorax. No acute bony abnormality identified. IMPRESSION: 1. Endotracheal tube tip 5 mm above the lower portion of the carina. Proximal repositioning of approximately 2 cm should be considered. Feeding tube in stable position. 2. Progressive bibasilar atelectasis/infiltrates. Bilateral interstitial prominence again noted suggesting interstitial edema and/or pneumonitis. These results will be called to the ordering clinician or representative by the Radiologist Assistant, and communication documented in the PACS or [employer]. Electronically Signed   By: Marcello Moores  Register   On: 12/31/2019 06:31   DG Chest Port 1V same Day  Result Date: 12/30/2019 CLINICAL DATA:  39 year old female with altered mental status. EXAM: PORTABLE CHEST 1 VIEW COMPARISON:  Chest radiograph dated 12/25/2019. FINDINGS: Feeding tube with tip to the right of the spine in the distal stomach. Overall improved aeration of the lungs compared to the prior radiograph. Mild interstitial nodularity. No focal consolidation, pleural effusion, pneumothorax. Six 60 stable cardiac silhouette. No acute osseous pathology. IMPRESSION: 1. No focal consolidation. 2. Feeding tube in the distal stomach. Electronically Signed   By: Anner Crete M.D.   On: 12/30/2019 18:30   EEG adult  Result Date: 12/31/2019 Lora Havens, MD     12/31/2019  9:47 AM Patient Name: Joann [name] MRN: [id number] Epilepsy Attending: Lora Havens Referring Physician/Provider: Dr Oren Binet Date: 12/30/2019 Duration: 22.25 mins Patient history: 39 yo F with h/o etoh dependence and seizures admitted for ams. EEG to evaluate for seizure.  Level of alertness: lethargic AEDs during EEG study: None Technical aspects: This EEG study was done with scalp electrodes positioned according to the 10-20 International  system of electrode placement. Electrical activity was acquired at a sampling rate of 500Hz  and reviewed with a high frequency filter of 70Hz  and a low frequency filter of 1Hz . EEG data were recorded continuously and digitally stored. DESCRIPTION: EEG showed continuous generalized low amplitude 2-3hz  delta slowing. Hyperventilation and photic stimulation were not performed. EEG was technically difficult due to significant myogenic artifact. ABNORMALITY - Continuous slow, generalized IMPRESSION: This technically difficult study is suggestive of severe diffuse encephalopathy, non specific to etiology. No seizures or epileptiform discharges were seen throughout the recording. Priyanka Barbra Sarks   Scheduled Meds: . chlorhexidine gluconate (MEDLINE KIT)  15 mL Mouth Rinse BID  . Chlorhexidine Gluconate Cloth  6 each Topical Daily  . docusate  100 mg Per Tube BID  . feeding supplement (PRO-STAT SUGAR FREE 64)  30 mL Per Tube Daily  . feeding supplement (VITAL 1.5 CAL)  1,000 mL Per Tube Q24H  . folic acid  1  mg Per Tube Daily  . free water  200 mL Per Tube Q4H  . lactulose  30 g Per Tube Q8H  . mouth rinse  15 mL Mouth Rinse 10 times per day  . midodrine  5 mg Per Tube TID WC  . multivitamin  15 mL Oral Daily  . oxymetazoline  1 spray Right Nare BID  . pantoprazole sodium  40 mg Per Tube Daily  . polyethylene glycol  17 g Oral Daily  . prednisoLONE  40 mg Per Tube Daily  . rifaximin  550 mg Per Tube BID  . sodium chloride flush  3 mL Intravenous Q12H  . thiamine  100 mg Per Tube Daily  . zinc sulfate  220 mg Per Tube Daily   Continuous Infusions: . sodium chloride Stopped (12/31/19 1729)  . phytonadione (VITAMIN K) IV 10 mg (01/01/20 0921)  . piperacillin-tazobactam (ZOSYN)  IV 12.5 mL/hr at 01/01/20 0600  . potassium PHOSPHATE IVPB (in mmol)     PRN Meds:.albuterol, fentaNYL (SUBLIMAZE) injection, fentaNYL (SUBLIMAZE) injection, LORazepam, ondansetron **OR** ondansetron (ZOFRAN) IV, sodium  chloride flush   ASSESMENT:   *   Epistaxis.  Appears to have resolved  *     Aspiration w pna, pneumonitis. Intubated on vent.   Day 2 Zosyn.  *    Hepatic encephalopathy.  Ammonia level remains in 190s as of yest lactulose, rifaximin, zinc.   Off Precedex.   Lactulose per NG tube continues.  Adequate volume of non-bloody or melenic stool.    *     Alcoholic hepatitis.  Day 7 prednisolone.  Midodrine in place.  Sandostatin stopped 4/19.  Overall discriminant function improved from admission level.  Lille score is 0.7 suggesting possible nonresponse to prednisolone  *    Anemia.  Macrocytic.  2 PRBCs on 4/17.  Hgb 9.9 >> 7.7 in last 24 hours..  *     Coagulopathy.  INR 2.  Day 3/3 IV vitamin K.  *   Thrombocytopenia, worsened.    *      Minimal ascites on 2 separate ultrasounds.  Ordered paracentesis to assess for SBP, but with resp failure this has not been pursued.  w Platelets 29 K best not to pursue.    *    Hypernatremia. Persists, better  *   Hypokalemia, improved but persists Renal function remains WNL.  *    Malnutrition in setting of chronic as well as acute illness..  Tube feeds, prostat in place.     PLAN   *   Ammonia level in AM  *   ?  Continue prednisolone?    Azucena Freed  01/01/2020, 10:16 AM Phone [phone]

## 2020-01-01 NOTE — Progress Notes (Signed)
[facility] ADULT ICU REPLACEMENT PROTOCOL FOR AM LAB REPLACEMENT ONLY  The patient does apply for the [facility] Adult ICU Electrolyte Replacment Protocol based on the criteria listed below:   1. Is GFR >/= 40 ml/min? Yes.    Patient's GFR today is >60 2. Is urine output >/= 0.5 ml/kg/hr for the last 6 hours? Yes.   Patient's UOP is .8 ml/kg/hr 3. Is BUN < 60 mg/dL? Yes.    Patient's BUN today is 11 4. Abnormal electrolyte(s): k-3.2 5. Ordered repletion with: per protocol 6. If a panic level lab has been reported, has the CCM MD in charge been notified? Yes.  .   Physician:  Dr. Jonetta Speak, Philis Nettle 01/01/2020 6:09 AM

## 2020-01-01 NOTE — Progress Notes (Signed)
Daily Progress Note   Patient Name: Joann [name]       Date: 01/01/2020 DOB: 1980-09-15  Age: 39 y.o. MRN#: [id number] Attending Physician: Rigoberto Noel, MD Primary Care Physician: Minna Antis Admit Date: 12/23/2019  Reason for Consultation/Follow-up: To discuss complex medical decision making related to patient's goals of care.  Chart reviewed in detail.  Patient examined.  Communicated with GI APP, Azucena Freed   Subjective: Patient intubated and not waking.  Communicated with RN.  I spoke to Joann [name] on the phone.  He asked if there was any further improvement in her labs.  He told me her bili dropped from 24-22.  I cautioned him that this was not a significant drop and to be careful about looking at individual numbers.  I explained that what we need to look at is her overall picture.  An improvement would be if she was able to wake up.  We agreed to meet tomorrow morning at 11:00 at bedside.  Assessment: 77 female advanced alcoholic liver disease, coagulopathy, GIB, encephalopathy.  Rising ammonia level despite maximal therapy.  Intubated for airway protection.   Patient Profile/HPI:  39 y.o. female  with past medical history of alcoholic hepatitis, chronic alcoholism (with previous hospitalizations), alcohol pancreatitis, admitted on 12/23/2019 with confusion, poor po intake and findings of acute hepatic encephalopathy, hepatic cirrhosis, acute blood loss anemia due to upper GI bleeding. MELD 33 on admission, Glasgow alcoholic hepatitis score 9.  During admission ammonia level has been refractory to lactulose treatment. EGD showed grade 2 varices- no active bleeding- however patient has had episodes of hematemesis and bloody stools. Now with significant epistaxis. Initially with  acute kidney injury- but this improved. Not a candidate for liver transplant due to ETOH use. On 4/22 she was found unresponsive and intubated for airway protection. Palliative medicine consulted for goals of care.   Length of Stay: 8   Vital Signs: BP 108/60   Pulse 99   Temp 97.7 F (36.5 C) (Axillary)   Resp (!) 26   Ht 5' 4"  (1.626 m)   Wt 63 kg   SpO2 100%   BMI 23.84 kg/m  SpO2: SpO2: 100 % O2 Device: O2 Device: Ventilator O2 Flow Rate: O2 Flow Rate (L/min): 2 L/min       Palliative Assessment/Data: 10%  Palliative Care Plan    Recommendations/Plan:  Will meet husband at bedside on Saturday at 50 AM  Would caution against focusing family on specific numbers.  Her overall picture is what is most important.    Code Status:  Full code  Prognosis:  Patient is at extremely high risk of further acute decline in death given advanced liver disease, refractory ammonia level/encephalopathy and severe coagulopathy  Discharge Planning:  To Be Determined   Thank you for allowing the Palliative Medicine Team to assist in the care of this patient.  Total time spent:  25 minutes     Greater than 50%  of this time was spent counseling and coordinating care related to the above assessment and plan.  Florentina Jenny, PA-C Palliative Medicine  Please contact Palliative MedicineTeam phone at [phone] for questions and concerns between 7 am - 7 pm.   Please see AMION for individual provider pager numbers.

## 2020-01-02 ENCOUNTER — Inpatient Hospital Stay (HOSPITAL_COMMUNITY): Payer: Medicaid Other

## 2020-01-02 DIAGNOSIS — Z515 Encounter for palliative care: Secondary | ICD-10-CM | POA: Diagnosis not present

## 2020-01-02 DIAGNOSIS — Z7189 Other specified counseling: Secondary | ICD-10-CM | POA: Diagnosis not present

## 2020-01-02 LAB — GLUCOSE, CAPILLARY
Glucose-Capillary: 108 mg/dL — ABNORMAL HIGH (ref 70–99)
Glucose-Capillary: 104 mg/dL — ABNORMAL HIGH (ref 70–99)
Glucose-Capillary: 146 mg/dL — ABNORMAL HIGH (ref 70–99)
Glucose-Capillary: 131 mg/dL — ABNORMAL HIGH (ref 70–99)
Glucose-Capillary: 145 mg/dL — ABNORMAL HIGH (ref 70–99)
Glucose-Capillary: 107 mg/dL — ABNORMAL HIGH (ref 70–99)

## 2020-01-02 LAB — RETICULOCYTES
Immature Retic Fract: 45 % — ABNORMAL HIGH (ref 2.3–15.9)
RBC.: 2.12 MIL/uL — ABNORMAL LOW (ref 3.87–5.11)
Retic Count, Absolute: 115.5 10*3/uL (ref 19.0–186.0)
Retic Ct Pct: 5.5 % — ABNORMAL HIGH (ref 0.4–3.1)

## 2020-01-02 LAB — CBC
HCT: 25.4 % — ABNORMAL LOW (ref 36.0–46.0)
Hemoglobin: 7.5 g/dL — ABNORMAL LOW (ref 12.0–15.0)
MCH: 34.9 pg — ABNORMAL HIGH (ref 26.0–34.0)
MCHC: 29.5 g/dL — ABNORMAL LOW (ref 30.0–36.0)
MCV: 118.1 fL — ABNORMAL HIGH (ref 80.0–100.0)
Platelets: 26 10*3/uL — CL (ref 150–400)
RBC: 2.15 MIL/uL — ABNORMAL LOW (ref 3.87–5.11)
RDW: 28.6 % — ABNORMAL HIGH (ref 11.5–15.5)
WBC: 25.7 10*3/uL — ABNORMAL HIGH (ref 4.0–10.5)
nRBC: 2.1 % — ABNORMAL HIGH (ref 0.0–0.2)

## 2020-01-02 LAB — CULTURE, RESPIRATORY W GRAM STAIN

## 2020-01-02 LAB — COMPREHENSIVE METABOLIC PANEL
ALT: 39 U/L (ref 0–44)
AST: 207 U/L — ABNORMAL HIGH (ref 15–41)
Albumin: 2.6 g/dL — ABNORMAL LOW (ref 3.5–5.0)
Alkaline Phosphatase: 151 U/L — ABNORMAL HIGH (ref 38–126)
Anion gap: 7 (ref 5–15)
BUN: 11 mg/dL (ref 6–20)
CO2: 24 mmol/L (ref 22–32)
Calcium: 8.6 mg/dL — ABNORMAL LOW (ref 8.9–10.3)
Chloride: 122 mmol/L — ABNORMAL HIGH (ref 98–111)
Creatinine, Ser: 0.62 mg/dL (ref 0.44–1.00)
GFR calc Af Amer: >60 mL/min (ref >60)
GFR calc non Af Amer: >60 mL/min (ref >60)
Glucose, Bld: 123 mg/dL — ABNORMAL HIGH (ref 70–99)
Potassium: 3.8 mmol/L (ref 3.5–5.1)
Sodium: 153 mmol/L — ABNORMAL HIGH (ref 135–145)
Total Bilirubin: 18.7 mg/dL (ref 0.3–1.2)
Total Protein: 6.8 g/dL (ref 6.5–8.1)

## 2020-01-02 LAB — MAGNESIUM: Magnesium: 2.4 mg/dL (ref 1.7–2.4)

## 2020-01-02 LAB — DIC (DISSEMINATED INTRAVASCULAR COAGULATION)PANEL
D-Dimer, Quant: 13.01 ug/mL-FEU — ABNORMAL HIGH (ref 0.00–0.50)
Fibrinogen: 190 mg/dL — ABNORMAL LOW (ref 210–475)
INR: 1.8 — ABNORMAL HIGH (ref 0.8–1.2)
Platelets: 30 10*3/uL — ABNORMAL LOW (ref 150–400)
Prothrombin Time: 20.3 seconds — ABNORMAL HIGH (ref 11.4–15.2)
Smear Review: NONE SEEN
aPTT: 35 seconds (ref 24–36)

## 2020-01-02 LAB — TECHNOLOGIST SMEAR REVIEW: Plt Morphology: DECREASED

## 2020-01-02 LAB — PHOSPHORUS: Phosphorus: 2.7 mg/dL (ref 2.5–4.6)

## 2020-01-02 LAB — PROTIME-INR
INR: 1.9 — ABNORMAL HIGH (ref 0.8–1.2)
Prothrombin Time: 21.3 seconds — ABNORMAL HIGH (ref 11.4–15.2)

## 2020-01-02 MED ORDER — DEXTROSE 5 % IV SOLN: INTRAVENOUS | Status: AC

## 2020-01-02 NOTE — Progress Notes (Addendum)
Daily Rounding Note  01/02/2020, 11:58 AM  LOS: 9 days   SUBJECTIVE:   Chief complaint: Severe alcoholic hepatitis.  Hepatic encephalopathy.    Continues to have watery brown stool output.  Remains obtunded.  Nasal packing removed today and no recurrence of epistaxis.  OBJECTIVE:         Vital signs in last 24 hours:    Temp:  [97.9 F (36.6 C)-98.4 F (36.9 C)] 98.4 F (36.9 C) (04/24 1133) Pulse Rate:  [66-126] 97 (04/24 1111) Resp:  [11-25] 15 (04/24 1111) BP: (99-139)/(62-86) 119/74 (04/24 1111) SpO2:  [95 %-100 %] 96 % (04/24 1111) FiO2 (%):  [30 %-40 %] 30 % (04/24 1111) Weight:  [65.1 kg] 65.1 kg (04/24 0421) Last BM Date: 01/01/20 Filed Weights   12/31/19 0650 01/01/20 0343 01/02/20 0421  Weight: 63.9 kg 63 kg 65.1 kg   General: Unresponsive to exam.  Remains intubated. Heart: RRR. Chest: Remains on vent, intubated.  No respiratory distress or breathing above vent. Abdomen: Soft, nondistended, no wincing or signs of pain with palpation. Extremities: Minimal pedal edema Neuro/Psych: Obtunded.  Intake/Output from previous day: 04/23 0701 - 04/24 0700 In: 2922.6 [I.V.:138; NG/GT:2080; IV Piggyback:704.6] Out: 1875 [Urine:1475; Stool:400]  Intake/Output this shift: Total I/O In: 284.9 [I.V.:3; NG/GT:245; IV Piggyback:36.9] Out: 300 [Urine:300]  Lab Results: Recent Labs    12/31/19 0426 12/31/19 0426 12/31/19 0826 12/31/19 2322 01/01/20 0348 01/02/20 0323 01/02/20 0522  WBC 33.6*  --   --   --  28.4* 25.7*  --   HGB 9.3*  --    < > 7.1* 7.7* 7.5*  --   HCT 29.3*  --    < > 22.9* 25.7* 25.4*  --   PLT 52*   < >  --   --  29* 26* 30*   < > = values in this interval not displayed.   BMET Recent Labs    12/31/19 0426 12/31/19 0426 12/31/19 0826 01/01/20 0348 01/02/20 0323  NA 150*   < > 158* 153* 153*  K 2.9*   < > 3.4* 3.2* 3.8  CL 117*  --   --  121* 122*  CO2 23  --   --  22 24    GLUCOSE 93  --   --  132* 123*  BUN 11  --   --  11 11  CREATININE 0.86  --   --  0.65 0.62  CALCIUM 9.2  --   --  8.7* 8.6*   < > = values in this interval not displayed.   LFT Recent Labs    12/31/19 0426 01/01/20 0348 01/02/20 0323  PROT 7.5 6.9 6.8  ALBUMIN 2.3* 2.9* 2.6*  AST 279* 225* 207*  ALT 39 38 39  ALKPHOS 178* 139* 151*  BILITOT 24.7* 22.2* 18.7*  BILIDIR 17.1*  --   --   IBILI 7.6*  --   --    PT/INR Recent Labs    01/02/20 0323 01/02/20 0522  LABPROT 21.3* 20.3*  INR 1.9* 1.8*   Hepatitis Panel No results for input(s): HEPBSAG, HCVAB, HEPAIGM, HEPBIGM in the last 72 hours.  Studies/Results: DG Chest Port 1 View  Result Date: 01/02/2020 CLINICAL DATA:  39 year old female with history of acute respiratory failure. EXAM: PORTABLE CHEST 1 VIEW COMPARISON:  Chest x-ray 12/31/2019. FINDINGS: An endotracheal tube is in place with tip 2.8 cm above the carina. A feeding tube is seen extending into the  abdomen, however, the tip of the feeding tube extends below the lower margin of the image. Lung volumes are low. Left basilar opacity may reflect areas of atelectasis and/or consolidation. Small bilateral pleural effusions. No evidence of pulmonary edema. Heart size is normal. Upper mediastinal contours are within normal limits. IMPRESSION: 1. Support apparatus, as above. 2. Low lung volumes with atelectasis and/or consolidation in the left lower lobe. 3. Small bilateral pleural effusions. Electronically Signed   By: Vinnie Langton M.D.   On: 01/02/2020 08:26   Scheduled Meds: . chlorhexidine gluconate (MEDLINE KIT)  15 mL Mouth Rinse BID  . Chlorhexidine Gluconate Cloth  6 each Topical Daily  . docusate  100 mg Per Tube BID  . feeding supplement (PRO-STAT SUGAR FREE 64)  30 mL Per Tube Daily  . feeding supplement (VITAL 1.5 CAL)  1,000 mL Per Tube Q24H  . folic acid  1 mg Per Tube Daily  . free water  200 mL Per Tube Q4H  . lactulose  30 g Per Tube Q8H  . mouth  rinse  15 mL Mouth Rinse 10 times per day  . midodrine  5 mg Per Tube TID WC  . multivitamin  15 mL Oral Daily  . oxymetazoline  1 spray Right Nare BID  . pantoprazole sodium  40 mg Per Tube Daily  . polyethylene glycol  17 g Oral Daily  . prednisoLONE  40 mg Per Tube Daily  . rifaximin  550 mg Per Tube BID  . sodium chloride flush  3 mL Intravenous Q12H  . thiamine  100 mg Per Tube Daily  . zinc sulfate  220 mg Per Tube Daily   Continuous Infusions: . sodium chloride Stopped (12/31/19 1729)  . dextrose 75 mL/hr at 01/02/20 1108  . piperacillin-tazobactam (ZOSYN)  IV Stopped (01/02/20 0956)   PRN Meds:.albuterol, fentaNYL (SUBLIMAZE) injection, fentaNYL (SUBLIMAZE) injection, LORazepam, ondansetron **OR** ondansetron (ZOFRAN) IV, sodium chloride flush  ASSESMENT:   *Alcoholic hepatitis. Day 8 prednisolone. Midodrine in place. Sandostatin stopped 4/19. Overall discriminant function improved from admission level. All LFTs downtrending.    *Aspiration pna, pneumonitis in setting of epistaxis. Intubated on vent.  Nasal packing removed 4/24.   Day 3 Zosyn.  *Hepatic encephalopathy. Lactulose,rifaximin, zinc in place.    *Anemia. Macrocytic.2 PRBCs on 4/17. Hgb9.9 >> 7.7 in last 24 hours..  *Coagulopathy. improved.   IVvitamin K x 3 days finished 4/23.  Marland Kitchen  FFP x 1 on  4/22  *Thrombocytopenia.  Platelets x 1 on 4/22  *Minimal ascites on 2 separate ultrasounds.   *Hypernatremia. Persists  *Hypokalemia, improved but persists Renal function remains WNL.  * Malnutrition in setting of chronic as well as acute illness.. Tube feeds, prostat in place.      PLAN   *  Pall care mtg today with husband.  Pall care wisely stated "Would caution against focusing family on specific numbers.  Her overall picture is what is most important"    Azucena Freed  01/02/2020, 11:58 AM Phone [phone]    Attending physician's  note   I have taken an interval history, reviewed the chart and examined the patient. I agree with the Advanced Practitioner's note, impression and recommendations.   Severe ETOH hepatitis in a patient with underlying liver cirrhosis on prednisolone (day 8)  Hepatic encephalopathy Acute hypoxic respiratory failure -intubated Thrombocytopenia- ?DIC  Plan: -Continue supportive treatment -lactulose/rifaximin/Zn -Continue prednisolone x 28 days -No new GI recommendations. -Guarded prognosis.   Carmell Austria, MD [facility]  [phone].

## 2020-01-02 NOTE — Progress Notes (Signed)
R naris packing taken out per Dr. Elsworth Soho. No bleeding noted at the sight.

## 2020-01-02 NOTE — Progress Notes (Signed)
eLink Physician-Brief Progress Note Patient Name: Joann [name] DOB: July 07, 1981 MRN: [id number]   Date of Service  01/02/2020  HPI/Events of Note  Platelet count down to 26 K  eICU Interventions  DIC screen ordered        Frederik Pear 01/02/2020, 4:56 AM

## 2020-01-02 NOTE — Progress Notes (Signed)
CRITICAL VALUE ALERT  Critical Value:  Platelets  Date & Time Notied:  01/02/20 @ 0345  Provider Notified: Warren Lacy  Orders Received/Actions taken: TBD

## 2020-01-02 NOTE — Progress Notes (Signed)
Placed patient back on full support for the night. Will continue wean in the morning.

## 2020-01-02 NOTE — Progress Notes (Signed)
NAME:  Joann [name], MRN:  [id number], DOB:  1980/11/14, LOS: 8 ADMISSION DATE:  12/23/2019, CONSULTATION DATE:  12/31/2019 REFERRING MD:  Dr. Myna Hidalgo, CHIEF COMPLAINT:  AMS  Brief History   25 yoF with ETOH abuse and alcoholic hepatitis presenting with worsening mental status found to have acute hepatic encephalopathy, anemia, AKI, and coagulapathy.  GI following.  Mental status has waxed/ waned however developed bleeding from suspected nares 4/22 and with ongoing low GCS, PCCM consulted over concerns of airway protection. Patient was intubated and transferred to ICU.    History of present illness   HPI obtained from medical chart review as patient is encephalopathic.    39 year old female with hx of HTN, ETOH abuse, alcohol pancreatitis, and withdrawal seizures admitted to [facility] on 4/14 with worsening mental status after binge drinking, found to have elevated LFTs, and concerns for active GI bleeding with anemia.  GI consulted.  Underwent EGD on 4/18 which showed non bleeding grade 2 varices.  Currently on prednisolone (started 4/17), lactulose, rifaximin, and midodrine.  Sandostatin stopped 4/19.    She has been empirically on ceftriaxone for leukocytosis with blood cultures with no growth to date.  Her AKI and LFTs have improved, however worsening ammonia levels despite increased dosing of lactulose.  Mental status has been waxing and wanning, however she had a clear CXR 4/21 and reassuring ABG.  Per RN, patient unresponsive throughout the night but developed bleeding from her nares.  Unable to be suctioned orally given clenched teeth.  PCCM consulted given concern for airway protection and active bleeding. Patient was intubated and transferred to ICU. Merocel sponge placed in right naris to aid in hemostasis.   Past Medical History  HTN, ETOH abuse, alcohol pancreatitis, withdrawal seizures  Significant Hospital Events   4/14 Admitted to [facility] 4/18 EGD 4/22 Admitted to ICU  Consults:  GI PMT   Procedures:  4/18 EGD 4/22 EEG   Significant Diagnostic Tests:  4/15 [facility] >> neg 4/18 EGD >> grade 2 varices, large amount of food in the stomach 4/19 limited abd [place] >>  Minimal abdominal ascites most prominent in the right upper Quadrant. 4/21 >> CXR and CTH unremarkable 4/22 >> CXR reveals:  Progressive bibasilar atelectasis/infiltrates. Bilateral interstitial prominence again noted suggesting interstitial edema and/or pneumonitis.  Micro Data:  4/15 SARS 2/ Flu A/B >> neg 4/17 BCx 2 >> NGTD @ 5 days 4/22 >> Tracheal Aspirate, rare budding yeast  Antimicrobials:  4/14 ceftriaxone >> 4/22 4/22 zosyn >> 4/22 vanc >>4/23 Interim history/subjective:  Patient continues to be obtunded without response to pain. She is showing minimal improvement with lactulose therapy. Continues to have good bowel movements. Palliative care is on board and having continued goals of care conversation with patient  Objective   Blood pressure 122/67, pulse 87, temperature 98.1 F (36.7 C), temperature source Oral, resp. rate 20, height 5' 4"  (1.626 m), weight 65.1 kg, SpO2 100 %.    Vent Mode: PRVC FiO2 (%):  [40 %] 40 % Set Rate:  [20 bmp] 20 bmp Vt Set:  [470 mL] 470 mL PEEP:  [5 cmH20] 5 cmH20 Pressure Support:  [15 cmH20] 15 cmH20 Plateau Pressure:  [18 cmH20-20 cmH20] 19 cmH20   Intake/Output Summary (Last 24 hours) at 01/02/2020 0701 Last data filed at 01/02/2020 0600 Gross per 24 hour  Intake 2870.02 ml  Output 1875 ml  Net 995.02 ml   Filed Weights   12/31/19 0650 01/01/20 0343 01/02/20 0421  Weight: 63.9 kg  63 kg 65.1 kg    Examination: General: Critically ill on ventilator.  Neuro:  Eyes are open, but unresponsive, does not blink to threat or respond to noxious stimuli HEENT:  Scleral icterus, Dried blood around nasal packing, right naris appears hemostatic, mild oral secretions with bite block in place. CV: RRR, S1/S2 heart sounds.  PULM: MV breath sounds, NWOB on pressure  support.  GI: distended, round  Extremities:warm and dry, no peripheral edema  Skin: generalized icterus   Resolved Hospital Problem list     Assessment & Plan:  Acute hypoxic respiratory failure in the setting of nose bleed and encephalopathy with presumed aspiration: CXR 4/22 + for bibasilar atelectasis/infiltrates - Continue MV - Will wean, pending improvement in mentation. - d/c'd precedex 2/2 bradycardia   - Continue PPI  - Continue Zosyn  - VAP protocol, bronchial hygiene, etc.  - Albuterol q4 prn - Fentanyl for pain and sedation   Encephalopathy- mixed hepatic +/- ETOH withdrawal in the setting of acute decompensated cirrhosis: CTH unremarkable 4/15, EEG 4/22 reveals no seizure like activity, continues to be unresponsive today.  - Continue lactulose TID and rifaximin - Continue thiamine/ folate - Oral bite block in place  Leukocytosis: - stable trend, despite steroids (started 4/17), marked jump 4/22, now stable at 25.7. Likely related to steroids, but difficult to rule out aspiration pneumonia. - Continue Zosyn  - BC no growth, Resp Cx show some yeast - trend WBC/ fever curve   Alcohol Hepatitis:  - initial MELD score 31/33 on admit, with mortality rate 52.6%, now MELD on 26 - Lillie score < 0.45 and will continue prednisolone  - Continue midodrine - minimal ascites on 4/19 [place], GI wants to perform paracentesis today.  Macrocytic anemia:   - trending CBC, transfuse for Hgb <7  Coagulopathy/ thrombocytopenia: secondary to alcoholic hepatitis/ liver cirrhosis. Acute bleeding from R naris now stable. Platelets continuing to downtrend. S/p 1 unit of platelets given. PLts stable at 26. - Replace Plts <10, or if bleeding reoccurs - trend CBC/ coags   Hypokalemia/  Hypernatremia/hypophosphotemia  - trend UOP/ daily renal panel - Replete potassium/phos - Continue free water for sodium >150, will start D5W at 75 cc/hr for 12 hours. - Repeat BMET tomorrow.  Best  practice:  Diet: Tube Feeds Pain/Anxiety/Delirium protocol (if indicated): prn fentanyl, precedex if needed VAP protocol (if indicated): yes DVT prophylaxis: SCDs only  GI prophylaxis: PPI Glucose control: CBG q 4 Mobility: BR Code Status: Full  Family Communication: Supposedly husband coming to ICU today, will update. Palliative on board regarding discussion of [place] Disposition:ICU   Labs   CBC: Recent Labs  Lab 12/29/19 0506 12/29/19 0506 12/30/19 0308 12/30/19 0308 12/31/19 0426 12/31/19 0426 12/31/19 0826 12/31/19 1311 12/31/19 2322 01/01/20 0348 01/02/20 0323 01/02/20 0522  WBC 19.7*  --  25.3*  --  33.6*  --   --   --   --  28.4* 25.7*  --   HGB 8.2*   < > 10.7*   < > 9.3*   < > 9.9* 7.9* 7.1* 7.7* 7.5*  --   HCT 26.2*   < > 33.5*   < > 29.3*   < > 29.0* 25.2* 22.9* 25.7* 25.4*  --   MCV 108.3*  --  109.5*  --  111.4*  --   --   --   --  117.9* 118.1*  --   PLT 55*   < > 57*  --  52*  --   --   --   --  29* 26* 30*   < > = values in this interval not displayed.    Basic Metabolic Panel: Recent Labs  Lab 12/26/19 1044 12/27/19 0352 12/28/19 0424 12/28/19 0424 12/29/19 0506 12/29/19 0506 12/30/19 0308 12/31/19 0426 12/31/19 0826 01/01/20 0348 01/02/20 0323  NA 130*   < > 133*   < > 136   < > 139 150* 158* 153* 153*  K 4.1   < > 3.5   < > 3.7   < > 4.2 2.9* 3.4* 3.2* 3.8  CL 104   < > 105   < > 109  --  110 117*  --  121* 122*  CO2 17*   < > 18*   < > 20*  --  18* 23  --  22 24  GLUCOSE 106*   < > 107*   < > 103*  --  125* 93  --  132* 123*  BUN <5*   < > <5*   < > <5*  --  8 11  --  11 11  CREATININE 1.48*   < > 1.06*   < > 1.08*  --  1.09* 0.86  --  0.65 0.62  CALCIUM 7.5*   < > 8.1*   < > 8.1*  --  8.8* 9.2  --  8.7* 8.6*  MG 2.0  --  2.1  --   --   --   --  2.7*  --  2.5* 2.4  PHOS  --   --   --   --   --   --   --  2.8  --  2.1* 2.7   < > = values in this interval not displayed.   GFR: Estimated Creatinine Clearance: 80.7 mL/min (by C-G formula  based on SCr of 0.62 mg/dL). Recent Labs  Lab 12/30/19 0308 12/31/19 0426 01/01/20 0348 01/02/20 0323  WBC 25.3* 33.6* 28.4* 25.7*    Liver Function Tests: Recent Labs  Lab 12/29/19 0506 12/30/19 0308 12/31/19 0426 01/01/20 0348 01/02/20 0323  AST 118* 193* 279* 225* 207*  ALT 25 32 39 38 39  ALKPHOS 164* 201* 178* 139* 151*  BILITOT 18.1* 22.9* 24.7* 22.2* 18.7*  PROT 6.3* 7.9 7.5 6.9 6.8  ALBUMIN 1.1* 1.6* 2.3* 2.9* 2.6*   No results for input(s): LIPASE, AMYLASE in the last 168 hours. Recent Labs  Lab 12/27/19 0352 12/28/19 0424 12/29/19 0506 12/30/19 0308 12/31/19 0426  AMMONIA 77* 142* 158* 198* 197*    ABG    Component Value Date/Time   PHART 7.448 12/31/2019 0826   PCO2ART 38.2 12/31/2019 0826   PO2ART 346 (H) 12/31/2019 0826   HCO3 26.3 12/31/2019 0826   TCO2 27 12/31/2019 0826   ACIDBASEDEF 2.5 (H) 12/30/2019 1655   O2SAT 100.0 12/31/2019 0826     Coagulation Profile: Recent Labs  Lab 12/29/19 0506 12/31/19 0426 12/31/19 1311 01/02/20 0323 01/02/20 0522  INR 1.9* 1.8* 2.0* 1.9* 1.8*    Cardiac Enzymes: No results for input(s): CKTOTAL, CKMB, CKMBINDEX, TROPONINI in the last 168 hours.  HbA1C: Hgb A1c MFr Bld  Date/Time Value Ref Range Status  09/29/2018 03:23 PM 4.8 4.8 - 5.6 % Final    Comment:             Prediabetes: 5.7 - 6.4          Diabetes: >6.4          Glycemic control for adults with diabetes: <7.0     CBG: Recent Labs  Lab 01/01/20 1142 01/01/20 1544 01/01/20 1935 01/01/20 2332 01/02/20 0317  GLUCAP 128* 106* 118* 110* 108*    Review of Systems:   Patient intubated.  Past Medical History  She,  has a past medical history of Alcohol abuse, Anxiety, Hypertension, and Liver disease.   Surgical History    Past Surgical History:  Procedure Laterality Date  . ECTOPIC PREGNANCY SURGERY    . ESOPHAGOGASTRODUODENOSCOPY (EGD) WITH PROPOFOL N/A 12/27/2019   Procedure: ESOPHAGOGASTRODUODENOSCOPY (EGD) WITH  PROPOFOL;  Surgeon: Carol Ada, MD;  Location: [place];  Service: Endoscopy;  Laterality: N/A;     Social History   reports that she has never smoked. She has never used smokeless tobacco. She reports current alcohol use. She reports that she does not use drugs.   Family History   Her family history includes Alcohol abuse in her father and mother; Diabetes in her father; Healthy in her brother, brother, brother, and mother; Hypertension in her maternal grandfather and maternal grandmother.   Allergies No Known Allergies   Home Medications  Prior to Admission medications   Medication Sig Start Date End Date Taking? Authorizing Provider  hydrOXYzine (ATARAX/VISTARIL) 25 MG tablet Take 25 mg by mouth every 6 (six) hours as needed for anxiety.   Yes [provider]  Multiple Vitamins-Minerals (MULTIVITAMIN WITH MINERALS) tablet Take 1 tablet by mouth daily.   Yes [provider]  topiramate (TOPAMAX) 200 MG tablet Take 200 mg by mouth 2 (two) times daily. 10/09/19  Yes [provider]  disulfiram (ANTABUSE) 250 MG tablet Take 250 mg by mouth daily.    [provider]  folic acid (FOLVITE) 1 MG tablet Take 1 mg by mouth daily.    [provider]  Multiple Vitamin (MULTIVITAMIN WITH MINERALS) TABS tablet Take 1 tablet by mouth 2 (two) times a week.     [provider]  traZODone (DESYREL) 50 MG tablet Take 50 mg by mouth at bedtime.    [provider]  venlafaxine (EFFEXOR) 75 MG tablet Take 75 mg by mouth daily.    [provider]     Critical care time:     Marianna Payment, D.O. [facility], PGY-1 Pager: [phone], Phone: [phone] Date 01/02/2020 Time 1:02 PM

## 2020-01-02 NOTE — Progress Notes (Signed)
                                                                                                                                                                                                         Daily Progress Note   Patient Name: Joann [name]       Date: 01/02/2020 DOB: 09/23/1980  Age: 39 y.o. MRN#: [id number] Attending Physician: Alva, Rakesh V, MD Primary Care Physician: Covington, Sarah, PA-C Admit Date: 12/23/2019  Reason for Consultation/Follow-up: To discuss complex medical decision making related to patient's goals of care.  Discussed with Dr. Alva and bedside ICU RN Morgan.  Patient is weaning from the vent.  Sedation turned off.  Seems to have eye lid movement and non purposeful upper extremity movement.  Subjective: Met with patient's husband Terrance and adult daughter Shakayla at bedside and then privately in the conference room.  Terrance exudes energy and  is vocal in his hope for improvement in his wife.   I asked who would make medical decisions for Sylvi as she can not make them on her own.  The family confirmed that Terrance is the surrogate decision maker.   However Terrance encourages the medical team to call him or Shakayla with updates as that gives them the ability to share information and discuss.  We discussed Jonessa's past medical history and this hospitalization.  Mechille had been very fatigued and sleepy for several weeks prior to admission.  She was becoming confused.  5 days prior to admission she committed to stop drinking all together.  To the best of her family's knowledge she did stop drinking.  Idali has been to alcohol rehab a couple of times in the past but it has been years ago.  We discussed her advanced liver failure including encephalopathy, ammonia levels and need for lactulose, coagulopathy with "thin" blood and increased bleeding, esophageal varices and the danger of those bleeding.  We also discussed decreased protein levels.   If drinking continues  her body will not tolerate it.  Hilma is fortunate because she is young and her kidneys are in good shape.    At this point we are looking for her to wake up, protect her airway and be extuabated.  Then she can go to the floor.  If she continues to recover and can eat and drink she will likely go to SNF rehab.  Afterward she will need to check herself into alcohol rehab.  I explained that the doctors will need to do more evaluation of her liver and if she stays alcohol   free she will likely need a hepatologist.   We discussed code status.  I recommended against chest compressions as a protective measure.  Family in agreement given her recent bleeding and esophageal varices.  Family asked for her mother to be allowed to visit from [place].  Patient's husband will not be here during the week as he is a long haul Administrator.  I discussed with RN staff and explained to the family that if the patient's mother wanted to visit it would need to be approved by the unit director on an individual case basis.  I did tell the family if they gave me a specific date/time (appointment) I would present the information to the Unit director.  Assessment: 39 yof currently encephalopathic, coagulopathic, and intubated for airway protection.  Advanced alcoholic liver disease.  Family learning about what this means and how to anticipate what will be needed in the future.   Patient Profile/HPI:  39 y.o. female  with past medical history of alcoholic hepatitis, chronic alcoholism (with previous hospitalizations), alcohol pancreatitis, admitted on 12/23/2019 with confusion, poor po intake and findings of acute hepatic encephalopathy, hepatic cirrhosis, acute blood loss anemia due to upper GI bleeding. MELD 33 on admission, Glasgow alcoholic hepatitis score 9.  During admission ammonia level has been refractory to lactulose treatment. EGD showed grade 2 varices- no active bleeding- however patient has had episodes of hematemesis  and bloody stools. Now with significant epistaxis. Initially with acute kidney injury- but this improved. Not a candidate for liver transplant due to ETOH use. On 4/22 she was found unresponsive and intubated for airway protection. Palliative medicine consulted for goals of care.      Length of Stay: 9   Vital Signs: BP 120/73   Pulse 98   Temp 98.3 F (36.8 C) (Axillary)   Resp 14   Ht 5' 4" (1.626 m)   Wt 65.1 kg   SpO2 98%   BMI 24.64 kg/m  SpO2: SpO2: 98 % O2 Device: O2 Device: Ventilator O2 Flow Rate: O2 Flow Rate (L/min): 2 L/min       Palliative Assessment/Data: 10%     Palliative Care Plan    Recommendations/Plan:  Changed to limited code:  No chest compressions  Family updated.  Began providing education regarding advanced liver disease and anticipatory needs.  PMT will continue to follow with you and intermittently touch base with family.  Chaplain services will be consulted.  Patient is Psychologist, forensic.  Code Status:  Limited code - no CPR  Prognosis:   Unable to determine.  She is at high risk for acute decline and death.  Hopeful for improvement, but concerned she is at very high risk for relapse and rehospitalization.  Discharge Planning:  To Be Determined  Care plan was discussed with medical team, family.  Thank you for allowing the Palliative Medicine Team to assist in the care of this patient.  Total time spent:  65 min.     Greater than 50%  of this time was spent counseling and coordinating care related to the above assessment and plan.  Florentina Jenny, PA-C Palliative Medicine  Please contact Palliative MedicineTeam phone at [phone] for questions and concerns between 7 am - 7 pm.   Please see AMION for individual provider pager numbers.

## 2020-01-03 DIAGNOSIS — F10231 Alcohol dependence with withdrawal delirium: Secondary | ICD-10-CM

## 2020-01-03 DIAGNOSIS — Z515 Encounter for palliative care: Secondary | ICD-10-CM | POA: Diagnosis not present

## 2020-01-03 DIAGNOSIS — J9600 Acute respiratory failure, unspecified whether with hypoxia or hypercapnia: Secondary | ICD-10-CM

## 2020-01-03 LAB — COMPREHENSIVE METABOLIC PANEL
ALT: 47 U/L — ABNORMAL HIGH (ref 0–44)
AST: 214 U/L — ABNORMAL HIGH (ref 15–41)
Albumin: 2.4 g/dL — ABNORMAL LOW (ref 3.5–5.0)
Alkaline Phosphatase: 175 U/L — ABNORMAL HIGH (ref 38–126)
Anion gap: 8 (ref 5–15)
BUN: 5 mg/dL — ABNORMAL LOW (ref 6–20)
CO2: 24 mmol/L (ref 22–32)
Calcium: 8.8 mg/dL — ABNORMAL LOW (ref 8.9–10.3)
Chloride: 115 mmol/L — ABNORMAL HIGH (ref 98–111)
Creatinine, Ser: 0.5 mg/dL (ref 0.44–1.00)
GFR calc Af Amer: >60 mL/min (ref >60)
GFR calc non Af Amer: >60 mL/min (ref >60)
Glucose, Bld: 127 mg/dL — ABNORMAL HIGH (ref 70–99)
Potassium: 3.8 mmol/L (ref 3.5–5.1)
Sodium: 147 mmol/L — ABNORMAL HIGH (ref 135–145)
Total Bilirubin: 17.1 mg/dL — ABNORMAL HIGH (ref 0.3–1.2)
Total Protein: 7.1 g/dL (ref 6.5–8.1)

## 2020-01-03 LAB — BPAM FFP
Blood Product Expiration Date: 202104252359
Blood Product Expiration Date: 202104252359
ISSUE DATE / TIME: 202104220630
Unit Type and Rh: 6200
Unit Type and Rh: 6200

## 2020-01-03 LAB — GLUCOSE, CAPILLARY
Glucose-Capillary: 110 mg/dL — ABNORMAL HIGH (ref 70–99)
Glucose-Capillary: 113 mg/dL — ABNORMAL HIGH (ref 70–99)
Glucose-Capillary: 127 mg/dL — ABNORMAL HIGH (ref 70–99)
Glucose-Capillary: 132 mg/dL — ABNORMAL HIGH (ref 70–99)
Glucose-Capillary: 139 mg/dL — ABNORMAL HIGH (ref 70–99)
Glucose-Capillary: 153 mg/dL — ABNORMAL HIGH (ref 70–99)

## 2020-01-03 LAB — CBC
HCT: 27.5 % — ABNORMAL LOW (ref 36.0–46.0)
Hemoglobin: 8.1 g/dL — ABNORMAL LOW (ref 12.0–15.0)
MCH: 35.4 pg — ABNORMAL HIGH (ref 26.0–34.0)
MCHC: 29.5 g/dL — ABNORMAL LOW (ref 30.0–36.0)
MCV: 120.1 fL — ABNORMAL HIGH (ref 80.0–100.0)
Platelets: 35 10*3/uL — ABNORMAL LOW (ref 150–400)
RBC: 2.29 MIL/uL — ABNORMAL LOW (ref 3.87–5.11)
RDW: 28.4 % — ABNORMAL HIGH (ref 11.5–15.5)
WBC: 25.7 10*3/uL — ABNORMAL HIGH (ref 4.0–10.5)
nRBC: 0.9 % — ABNORMAL HIGH (ref 0.0–0.2)

## 2020-01-03 LAB — PREPARE FRESH FROZEN PLASMA

## 2020-01-03 LAB — MAGNESIUM: Magnesium: 2.2 mg/dL (ref 1.7–2.4)

## 2020-01-03 LAB — PHOSPHORUS: Phosphorus: 2.6 mg/dL (ref 2.5–4.6)

## 2020-01-03 MED ORDER — LACTULOSE 10 GM/15ML PO SOLN
20.0000 g | Freq: Three times a day (TID) | ORAL | Status: DC
  Administered 2020-01-03 – 2020-01-08 (×14): 20 g
  Filled 2020-01-03 (×14): qty 30

## 2020-01-03 MED ORDER — DEXMEDETOMIDINE HCL IN NACL 400 MCG/100ML IV SOLN
0.4000 ug/kg/h | INTRAVENOUS | Status: DC
  Administered 2020-01-03: 0.4 ug/kg/h via INTRAVENOUS
  Administered 2020-01-04: 0.3 ug/kg/h via INTRAVENOUS
  Administered 2020-01-04 – 2020-01-05 (×2): 0.4 ug/kg/h via INTRAVENOUS
  Filled 2020-01-03 (×4): qty 100

## 2020-01-03 NOTE — Progress Notes (Signed)
eLink Physician-Brief Progress Note Patient Name: Joann [name] DOB: [birth year] MRN: [id number]   Date of Service  01/03/2020  HPI/Events of Note  Pt spouse demanding exception to visitation policy .  eICU Interventions  I explained the policy and the reason for it, then requested that the spouse comply. He indicated that he will comply.        Frederik Pear 01/03/2020, 9:46 PM

## 2020-01-03 NOTE — Progress Notes (Signed)
NAME:  Joann [name], MRN:  [id number], DOB:  1981-08-15, LOS: 13 ADMISSION DATE:  12/23/2019, CONSULTATION DATE: 12/31/2019 REFERRING MD:  Dr. Myna Hidalgo, CHIEF COMPLAINT:  AMS   Brief History   89 yoF with ETOH abuse and alcoholic hepatitis presenting with worsening mental status found to have acute hepatic encephalopathy, anemia, AKI, and coagulapathy. GI following. Mental status has waxed/ waned however developed bleeding from suspected nares 4/22 and with ongoing low GCS, PCCM consulted over concerns of airway protection. Patient was intubated and transferred to ICU. History of present illness   HPI obtained from medical chart review as patient is encephalopathic.   39 year old female with hx of HTN, ETOH abuse, alcohol pancreatitis, and withdrawal seizures admitted to [facility] on 4/14 with worsening mental status after binge drinking, found to have elevated LFTs, and concerns for active GI bleeding with anemia. GI consulted. Underwent EGD on 4/18 which showed non bleeding grade 2 varices. Currently on prednisolone (started 4/17), lactulose, rifaximin, and midodrine. Sandostatin stopped 4/19. She has been empirically on ceftriaxone for leukocytosis with blood cultures with no growth to date. Her AKI and LFTs have improved, however worsening ammonia levels despite increased dosing of lactulose. Mental status has been waxing and wanning, however she had a clear CXR 4/21 and reassuring ABG. Per RN, patient unresponsive throughout the night but developed bleeding from her nares. Unable to be suctioned orally given clenched teeth. PCCM consulted given concern for airway protection and active bleeding. Patient was intubated and transferred to ICU. Merocel sponge placed in right naris to aid in hemostasis.  Past Medical History  HTN, ETOH abuse, alcohol pancreatitis, withdrawal seizures Significant Hospital Events   4/14 Admitted to [facility] 4/18 EGD 4/22 Admitted to ICU  Consults:   GI PMT  Procedures:  4/18 EGD 4/22 EEG  Significant Diagnostic Tests:  4/15 [facility] >> neg 4/18 EGD >> grade 2 varices, large amount of food in the stomach 4/19 limited abd [place] >> Minimal abdominal ascites most prominent in the right upper Quadrant. 4/21 >> CXR and CTH unremarkable 4/22 >> CXR reveals: Progressive bibasilar atelectasis/infiltrates. Bilateral interstitial prominence again noted suggesting interstitial edema and/or pneumonitis.  Micro Data:  4/15 SARS 2/ Flu A/B >> neg 4/17 BCx 2 >>NGTD @ 5 days 4/22 >> Tracheal Aspirate, rare budding yeast  Antimicrobials:  4/14 ceftriaxone >> 4/22 4/22 zosyn >> 4/22 vanc >>4/23  Interim history/subjective:  Patient is showing some progress with mental status.  She is now having spontaneous movement of all 4 extremities opening eyelids to sound and pain and having some purposeful movement.  Patient still not speaking secondary to intubation.  Objective   Blood pressure 120/79, pulse (!) 107, temperature 97.9 F (36.6 C), temperature source Axillary, resp. rate (!) 27, height 5' 4"  (1.626 m), weight 65.1 kg, SpO2 97 %.    Vent Mode: CPAP;PSV FiO2 (%):  [30 %-40 %] 30 % Set Rate:  [20 bmp] 20 bmp Vt Set:  [470 mL] 470 mL PEEP:  [5 cmH20] 5 cmH20 Pressure Support:  [5 cmH20-8 cmH20] 5 cmH20 Plateau Pressure:  [16 cmH20] 16 cmH20   Intake/Output Summary (Last 24 hours) at 01/03/2020 1043 Last data filed at 01/03/2020 1000 Gross per 24 hour  Intake 2169.93 ml  Output 3200 ml  Net -1030.07 ml   Filed Weights   12/31/19 0650 01/01/20 0343 01/02/20 0421  Weight: 63.9 kg 63 kg 65.1 kg    Examination: General:Critically ill on ventilator. Neuro: Eyes are open, but unresponsive, does  not blinkto threat or respond to noxious stimuli HEENT:Scleral icterus, Dried blood around nasal packing, right naris appears hemostatic,mildoral secretions with bite block in place. CV:RRR,S1/S2 heart sounds. PULM:MV breath  sounds, NWOB on pressure support. GI: distended, round  Extremities:warm and dry, noperipheraledema  Skin:generalizedicterus   Resolved Hospital Problem list     Assessment & Plan:  Acute hypoxic respiratory failure in the setting of nose bleed and encephalopathy with presumed aspiration: CXR 4/22 + for bibasilar atelectasis/infiltrates - Continue MV -Mentation improving we will try spontaneous breathing trials and extubate when her mental status improves more. -Continue PPI - Continue Zosyn, pending repeat respiratory cultures. - VAP protocol, bronchial hygiene, etc.  - Albuterol q4 prn - Fentanyl for pain and sedation as needed  Encephalopathy- mixed hepatic +/- ETOH withdrawal in the setting of acute decompensated cirrhosis:CTH unremarkable 4/15, EEG 4/22 reveals no seizure like activity,continues to be unresponsive today.  -Continuelactulose TID decreased 20 mg and rifaximin -Continue thiamine/ folate - Oral bite block in place  Leukocytosis: - stable trend, despite steroids (started 4/17), marked jump 4/22, now stable at 25.7. Likely related to steroids, but difficult to rule out aspiration pneumonia. - Continue Zosyn  - BC no growth, Resp Cx show some yeast -We will repeat respiratory culture today. - trend WBC/ fever curve   Alcohol Hepatitis: - initial MELD score 31/33 on admit, with mortality rate 52.6%, now MELD on 26 - Lillie score < 0.45 and will continue prednisolone -Continue midodrine - minimal ascites on 4/19 [place].  Macrocytic anemia: - trending CBC, transfuse for Hgb <7  Coagulopathy/ thrombocytopenia:secondary to alcoholic hepatitis/ liver cirrhosis. Acute bleedingfromR naris now stable.Platelets continuing to downtrend. S/p 1 unit of platelets given. PLts stable at 26. -Replace Plts <10, or if bleeding reoccurs - trend CBC/ coags   Hypokalemia/ Hypernatremia/hypophosphotemia - trend UOP/ daily renal panel -Replete  potassium/phos - Continue free water for sodium >150, will start D5W at 75 cc/hr for 12 hours. - Repeat BMET tomorrow.  Best practice:  Diet: TF Pain/Anxiety/Delirium protocol (if indicated): Fentanyl VAP protocol (if indicated): continue VAP protocol DVT prophylaxis: SCDs GI prophylaxis: PPI Glucose control: CBG q 4 Mobility: BR Code Status: Partial Family Communication: I will speak to the husband today to give him an update. Disposition: ICU  Labs   CBC: Recent Labs  Lab 12/30/19 0308 12/30/19 0308 12/31/19 0426 12/31/19 0826 12/31/19 1311 12/31/19 2322 01/01/20 0348 01/02/20 0323 01/02/20 0522 01/03/20 0620  WBC 25.3*  --  33.6*  --   --   --  28.4* 25.7*  --  25.7*  HGB 10.7*   < > 9.3*   < > 7.9* 7.1* 7.7* 7.5*  --  8.1*  HCT 33.5*   < > 29.3*   < > 25.2* 22.9* 25.7* 25.4*  --  27.5*  MCV 109.5*  --  111.4*  --   --   --  117.9* 118.1*  --  120.1*  PLT 57*   < > 52*  --   --   --  29* 26* 30* 35*   < > = values in this interval not displayed.    Basic Metabolic Panel: Recent Labs  Lab 12/28/19 0424 12/29/19 0506 12/30/19 0308 12/30/19 0308 12/31/19 0426 12/31/19 6283 01/01/20 0348 01/02/20 0323 01/03/20 0620  NA 133*   < > 139   < > 150* 158* 153* 153* 147*  K 3.5   < > 4.2   < > 2.9* 3.4* 3.2* 3.8 3.8  CL 105   < >  110  --  117*  --  121* 122* 115*  CO2 18*   < > 18*  --  23  --  22 24 24   GLUCOSE 107*   < > 125*  --  93  --  132* 123* 127*  BUN <5*   < > 8  --  11  --  11 11 5*  CREATININE 1.06*   < > 1.09*  --  0.86  --  0.65 0.62 0.50  CALCIUM 8.1*   < > 8.8*  --  9.2  --  8.7* 8.6* 8.8*  MG 2.1  --   --   --  2.7*  --  2.5* 2.4 2.2  PHOS  --   --   --   --  2.8  --  2.1* 2.7 2.6   < > = values in this interval not displayed.   GFR: Estimated Creatinine Clearance: 80.7 mL/min (by C-G formula based on SCr of 0.5 mg/dL). Recent Labs  Lab 12/31/19 0426 01/01/20 0348 01/02/20 0323 01/03/20 0620  WBC 33.6* 28.4* 25.7* 25.7*    Liver  Function Tests: Recent Labs  Lab 12/30/19 0308 12/31/19 0426 01/01/20 0348 01/02/20 0323 01/03/20 0620  AST 193* 279* 225* 207* 214*  ALT 32 39 38 39 47*  ALKPHOS 201* 178* 139* 151* 175*  BILITOT 22.9* 24.7* 22.2* 18.7* 17.1*  PROT 7.9 7.5 6.9 6.8 7.1  ALBUMIN 1.6* 2.3* 2.9* 2.6* 2.4*   No results for input(s): LIPASE, AMYLASE in the last 168 hours. Recent Labs  Lab 12/28/19 0424 12/29/19 0506 12/30/19 0308 12/31/19 0426  AMMONIA 142* 158* 198* 197*    ABG    Component Value Date/Time   PHART 7.448 12/31/2019 0826   PCO2ART 38.2 12/31/2019 0826   PO2ART 346 (H) 12/31/2019 0826   HCO3 26.3 12/31/2019 0826   TCO2 27 12/31/2019 0826   ACIDBASEDEF 2.5 (H) 12/30/2019 1655   O2SAT 100.0 12/31/2019 0826     Coagulation Profile: Recent Labs  Lab 12/29/19 0506 12/31/19 0426 12/31/19 1311 01/02/20 0323 01/02/20 0522  INR 1.9* 1.8* 2.0* 1.9* 1.8*    Cardiac Enzymes: No results for input(s): CKTOTAL, CKMB, CKMBINDEX, TROPONINI in the last 168 hours.  HbA1C: Hgb A1c MFr Bld  Date/Time Value Ref Range Status  09/29/2018 03:23 PM 4.8 4.8 - 5.6 % Final    Comment:             Prediabetes: 5.7 - 6.4          Diabetes: >6.4          Glycemic control for adults with diabetes: <7.0     CBG: Recent Labs  Lab 01/02/20 1623 01/02/20 1929 01/02/20 2317 01/03/20 0405 01/03/20 0719  GLUCAP 131* 145* 107* 132* 110*    Review of Systems:   Unable to obtain due to intubation  Past Medical History  She,  has a past medical history of Alcohol abuse, Anxiety, Hypertension, and Liver disease.   Surgical History    Past Surgical History:  Procedure Laterality Date  . ECTOPIC PREGNANCY SURGERY    . ESOPHAGOGASTRODUODENOSCOPY (EGD) WITH PROPOFOL N/A 12/27/2019   Procedure: ESOPHAGOGASTRODUODENOSCOPY (EGD) WITH PROPOFOL;  Surgeon: Carol Ada, MD;  Location: [place];  Service: Endoscopy;  Laterality: N/A;     Social History   reports that she has never  smoked. She has never used smokeless tobacco. She reports current alcohol use. She reports that she does not use drugs.   Family History  Her family history includes Alcohol abuse in her father and mother; Diabetes in her father; Healthy in her brother, brother, brother, and mother; Hypertension in her maternal grandfather and maternal grandmother.   Allergies No Known Allergies   Home Medications  Prior to Admission medications   Medication Sig Start Date End Date Taking? Authorizing Provider  hydrOXYzine (ATARAX/VISTARIL) 25 MG tablet Take 25 mg by mouth every 6 (six) hours as needed for anxiety.   Yes [provider]  Multiple Vitamins-Minerals (MULTIVITAMIN WITH MINERALS) tablet Take 1 tablet by mouth daily.   Yes [provider]  topiramate (TOPAMAX) 200 MG tablet Take 200 mg by mouth 2 (two) times daily. 10/09/19  Yes [provider]  disulfiram (ANTABUSE) 250 MG tablet Take 250 mg by mouth daily.    [provider]  folic acid (FOLVITE) 1 MG tablet Take 1 mg by mouth daily.    [provider]  Multiple Vitamin (MULTIVITAMIN WITH MINERALS) TABS tablet Take 1 tablet by mouth 2 (two) times a week.     [provider]  traZODone (DESYREL) 50 MG tablet Take 50 mg by mouth at bedtime.    [provider]  venlafaxine (EFFEXOR) 75 MG tablet Take 75 mg by mouth daily.    [provider]     Critical care time:     Marianna Payment, D.O. [facility], PGY-1 Pager: [phone], Phone: [phone] Date 01/03/2020 Time 10:49 AM

## 2020-01-03 NOTE — Progress Notes (Signed)
Daily Progress Note   Patient Name: Joann [name]       Date: 01/03/2020 DOB: [birth year]  Age: 39 y.o. MRN#: [id number] Attending Physician: Rigoberto Noel, MD Primary Care Physician: Minna Antis Admit Date: 12/23/2019  Reason for Consultation/Follow-up: To discuss complex medical decision making related to patient's goals of care  Subjective: Visited patient at bedside.  She is attempting to shift around in bed.  Eyes open.  Slightly agitated.  Discussed briefly with CCM team.  Hopeful she will be able to come off the vent in the near term if her mental status will allow her to protect her airway.  Responded to a call from her mother Marsh Dolly.  Terrence Dupont is concerned that patient's husband Celesta Gentile is not going to be able to be at bedside during the week (he is a long haul Administrator).  Family is requesting that Terrence Dupont be allowed to come during the week to sit with her daughter when her husband is away.   Terrence Dupont stated she is working with Nursing in [facility] - I will defer to nursing staff.   Assessment: Patient with advanced alcoholic liver disease, coagulopathy, varices, refractory encephalopathy, thrombocytopenia.  Just beginning to show signs of improvement.   Patient Profile/HPI:   39 y.o. female  with past medical history of alcoholic hepatitis, chronic alcoholism (with previous hospitalizations), alcohol pancreatitis, admitted on 12/23/2019 with confusion, poor po intake and findings of acute hepatic encephalopathy, hepatic cirrhosis, acute blood loss anemia due to upper GI bleeding. MELD 33 on admission, Glasgow alcoholic hepatitis score 9.  During admission ammonia level has been refractory to lactulose treatment. EGD showed grade 2 varices- no active bleeding- however patient has had  episodes of hematemesis and bloody stools. Now with significant epistaxis. Initially with acute kidney injury- but this improved. Not a candidate for liver transplant due to ETOH use. On 4/22 she was found unresponsive and intubated for airway protection. Palliative medicine consulted for goals of care.   Length of Stay: 10   Vital Signs: BP 120/79   Pulse (!) 107   Temp 97.9 F (36.6 C) (Axillary)   Resp (!) 27   Ht 5' 4"  (1.626 m)   Wt 65.1 kg   SpO2 97%   BMI 24.64 kg/m  SpO2: SpO2: 97 % O2 Device: O2 Device:  Ventilator O2 Flow Rate: O2 Flow Rate (L/min): 2 L/min       Palliative Assessment/Data: 10%     Palliative Care Plan    Recommendations/Plan:   If she survives this episode she will need SNF rehab, then alcohol rehab despite the fact that she has committed to quit.  Family hopeful for improvement.  Full code / Full scope.    Family is [place].  Husband is of the [employer] faith and very unlikely to ever consider anything but full code/full scope.  Patient will need intermittent on-going Palliative follow up.  When she is able to make her own decisions her values need to be elicited and documented.  Code Status:  Full code  Prognosis:   Unable to determine   Discharge Planning:  [facility] for rehab with Palliative care service follow-up  Care plan was discussed with Primary Team, Family.  Thank you for allowing the Palliative Medicine Team to assist in the care of this patient.  Total time spent:  25 min.     Greater than 50%  of this time was spent counseling and coordinating care related to the above assessment and plan.  Florentina Jenny, PA-C Palliative Medicine  Please contact Palliative MedicineTeam phone at [phone] for questions and concerns between 7 am - 7 pm.   Please see AMION for individual provider pager numbers.

## 2020-01-03 NOTE — Progress Notes (Signed)
Chaplain engaged in initial visit with Joann [name] and her husband Joann [name].  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  He stated that his wife is great at saving money.  Joann [name] proclaimed, "We are equally yoked, and when I'm alright...she's alright..."  Chaplain offered empathic listening as Joann [name] described Joann [name] and his work.  Chaplain also offered prayer over Joann [name] with Joann [name] and the nurse at bedside.  Chaplain will have unit chaplain continue to follow-up.

## 2020-01-04 DIAGNOSIS — R17 Unspecified jaundice: Secondary | ICD-10-CM

## 2020-01-04 DIAGNOSIS — Z7189 Other specified counseling: Secondary | ICD-10-CM | POA: Diagnosis not present

## 2020-01-04 DIAGNOSIS — E8809 Other disorders of plasma-protein metabolism, not elsewhere classified: Secondary | ICD-10-CM | POA: Diagnosis not present

## 2020-01-04 DIAGNOSIS — K7011 Alcoholic hepatitis with ascites: Secondary | ICD-10-CM

## 2020-01-04 LAB — COMPREHENSIVE METABOLIC PANEL
ALT: 47 U/L — ABNORMAL HIGH (ref 0–44)
AST: 205 U/L — ABNORMAL HIGH (ref 15–41)
Albumin: 2.3 g/dL — ABNORMAL LOW (ref 3.5–5.0)
Alkaline Phosphatase: 174 U/L — ABNORMAL HIGH (ref 38–126)
Anion gap: 10 (ref 5–15)
BUN: 14 mg/dL (ref 6–20)
CO2: 22 mmol/L (ref 22–32)
Calcium: 8.7 mg/dL — ABNORMAL LOW (ref 8.9–10.3)
Chloride: 115 mmol/L — ABNORMAL HIGH (ref 98–111)
Creatinine, Ser: 0.58 mg/dL (ref 0.44–1.00)
GFR calc Af Amer: >60 mL/min (ref >60)
GFR calc non Af Amer: >60 mL/min (ref >60)
Glucose, Bld: 124 mg/dL — ABNORMAL HIGH (ref 70–99)
Potassium: 4.1 mmol/L (ref 3.5–5.1)
Sodium: 147 mmol/L — ABNORMAL HIGH (ref 135–145)
Total Bilirubin: 15.1 mg/dL — ABNORMAL HIGH (ref 0.3–1.2)
Total Protein: 6.6 g/dL (ref 6.5–8.1)

## 2020-01-04 LAB — GLUCOSE, CAPILLARY
Glucose-Capillary: 126 mg/dL — ABNORMAL HIGH (ref 70–99)
Glucose-Capillary: 116 mg/dL — ABNORMAL HIGH (ref 70–99)
Glucose-Capillary: 126 mg/dL — ABNORMAL HIGH (ref 70–99)
Glucose-Capillary: 125 mg/dL — ABNORMAL HIGH (ref 70–99)
Glucose-Capillary: 144 mg/dL — ABNORMAL HIGH (ref 70–99)
Glucose-Capillary: 137 mg/dL — ABNORMAL HIGH (ref 70–99)

## 2020-01-04 LAB — CBC
HCT: 26.6 % — ABNORMAL LOW (ref 36.0–46.0)
Hemoglobin: 7.8 g/dL — ABNORMAL LOW (ref 12.0–15.0)
MCH: 35 pg — ABNORMAL HIGH (ref 26.0–34.0)
MCHC: 29.3 g/dL — ABNORMAL LOW (ref 30.0–36.0)
MCV: 119.3 fL — ABNORMAL HIGH (ref 80.0–100.0)
Platelets: 38 10*3/uL — ABNORMAL LOW (ref 150–400)
RBC: 2.23 MIL/uL — ABNORMAL LOW (ref 3.87–5.11)
RDW: 28.1 % — ABNORMAL HIGH (ref 11.5–15.5)
WBC: 21 10*3/uL — ABNORMAL HIGH (ref 4.0–10.5)
nRBC: 0.5 % — ABNORMAL HIGH (ref 0.0–0.2)

## 2020-01-04 NOTE — Progress Notes (Signed)
NAME:  Joann [name], MRN:  [id number], DOB:  08/13/1981, LOS: 6 ADMISSION DATE:  12/23/2019, CONSULTATION DATE: 12/31/2019 REFERRING MD:  Dr. Myna Hidalgo, CHIEF COMPLAINT:  AMS   Brief History   39 yoF with ETOH abuse and alcoholic hepatitis presenting with worsening mental status found to have acute hepatic encephalopathy, anemia, AKI, and coagulapathy. GI following. Mental status has waxed/ waned however developed bleeding from suspected nares 4/22 and with ongoing low GCS, PCCM consulted over concerns of airway protection. Patient was intubated and transferred to ICU. History of present illness   HPI obtained from medical chart review as patient is encephalopathic.   39 year old female with hx of HTN, ETOH abuse, alcohol pancreatitis, and withdrawal seizures admitted to [facility] on 4/14 with worsening mental status after binge drinking, found to have elevated LFTs, and concerns for active GI bleeding with anemia. GI consulted. Underwent EGD on 4/18 which showed non bleeding grade 2 varices. Currently on prednisolone (started 4/17), lactulose, rifaximin, and midodrine. Sandostatin stopped 4/19. She has been empirically on ceftriaxone for leukocytosis with blood cultures with no growth to date. Her AKI and LFTs have improved, however worsening ammonia levels despite increased dosing of lactulose. Mental status has been waxing and wanning, however she had a clear CXR 4/21 and reassuring ABG. Per RN, patient unresponsive throughout the night but developed bleeding from her nares. Unable to be suctioned orally given clenched teeth. PCCM consulted given concern for airway protection and active bleeding. Patient was intubated and transferred to ICU. Merocel sponge placed in right naris to aid in hemostasis.  Past Medical History  HTN, ETOH abuse, alcohol pancreatitis, withdrawal seizures Significant Hospital Events   4/14 Admitted to [facility] 4/18 EGD 4/22 Admitted to ICU  Consults:   GI PMT  Procedures:  4/18 EGD 4/22 EEG  Significant Diagnostic Tests:  4/15 [facility] >> neg 4/18 EGD >> grade 2 varices, large amount of food in the stomach 4/19 limited abd [place] >> Minimal abdominal ascites most prominent in the right upper Quadrant. 4/21 >> CXR and CTH unremarkable 4/22 >> CXR reveals: Progressive bibasilar atelectasis/infiltrates. Bilateral interstitial prominence again noted suggesting interstitial edema and/or pneumonitis.  Micro Data:  4/15 SARS 2/ Flu A/B >> neg 4/17 BCx 2 >>NGTD @ 5 days 4/22 >> Tracheal Aspirate, rare budding yeast 4/25 Resp culture>  Antimicrobials:  4/14 ceftriaxone >> 4/22 4/22 zosyn >> 4/22 vanc >>4/23  Interim history/subjective:  Continues to having improved mental status. She is still not completely alert and following commands, but has spontaneous movement of all 4 extremities opening eyelids to sound and pain and having some purposeful movement. Good bowel movements.   Objective   Blood pressure (!) 89/56, pulse 67, temperature (!) 97.5 F (36.4 C), temperature source Oral, resp. rate 20, height 5' 4"  (1.626 m), weight 65.1 kg, SpO2 99 %.    Vent Mode: PRVC FiO2 (%):  [30 %-40 %] 40 % Set Rate:  [20 bmp] 20 bmp Vt Set:  [470 mL] 470 mL PEEP:  [5 cmH20] 5 cmH20 Pressure Support:  [5 cmH20] 5 cmH20 Plateau Pressure:  [18 cmH20-22 cmH20] 18 cmH20   Intake/Output Summary (Last 24 hours) at 01/04/2020 3810 Last data filed at 01/04/2020 0549 Gross per 24 hour  Intake 1757.71 ml  Output 3425 ml  Net -1667.29 ml   Filed Weights   12/31/19 0650 01/01/20 0343 01/02/20 0421  Weight: 63.9 kg 63 kg 65.1 kg    Examination: General:Critically ill on ventilator. Neuro: Eyes are open,  but unresponsive, does open eyes and move extremities spontaneously  HEENT:Scleral icterus, Dried blood around nasal packing, right naris appears hemostatic,mildoral secretions with bite block in place. CV:RRR,S1/S2 heart  sounds. PULM:MV breath sounds, NWOB on pressure support. GI: distended, round  Extremities:warm and dry, noperipheraledema  Skin:generalizedicterus   Lines/Tubes: 0/26 Rt Basilic midline - clean and dry, without erythema 4/18 Lt forearm - clean and dry, without erythema 4/18 Rectal tube - good stool output, liquid stool 4/25 External urethral catheter - good urine output 4/22 ETT - some epistaxis, bite block in place 4/21 NG/OR tube - in place, clean and dry  Resolved Hospital Problem list     Assessment & Plan:  Acute hypoxic respiratory failure in the setting of nose bleed and encephalopathy with presumed aspiration: CXR 4/22 + for bibasilar atelectasis/infiltrates - Continue MV -Mentation improving we will try spontaneous breathing trials and extubate when her mental status improves more. -Continue PPI - Continue Zosyn, pending repeat respiratory cultures. - VAP protocol, bronchial hygiene, etc.  - Albuterol q4 prn - Fentanyl for pain and sedation as needed  Encephalopathy- mixed hepatic +/- ETOH withdrawal in the setting of acute decompensated cirrhosis:CTH unremarkable 4/15, EEG 4/22 reveals no seizure like activity,continues to be unresponsive today.  -Continuelactulose TID decreased 20 mg and rifaximin -Continue thiamine/ folate - Oral bite block in place  Leukocytosis: - stable trend, despite steroids (started 4/17), marked jump 4/22, now stable at 25.7. Likely related to steroids, but difficult to rule out aspiration pneumonia. - Continue Zosyn, on day 5 of AB therapy - BC no growth, Resp Cx show some yeast -We will repeat respiratory culture today. - trend WBC/ fever curve   Alcohol Hepatitis: - initial MELD score 31/33 on admit, with mortality rate 52.6%, now MELD on 26 - Lillie score < 0.45 and will continue prednisolonefor 28 days. On day 10.  -Continue midodrine - minimal ascites on 4/19 [place].  Macrocytic anemia: - trending CBC,  transfuse for Hgb <7  Coagulopathy/ thrombocytopenia:secondary to alcoholic hepatitis/ liver cirrhosis. Acute bleedingfromR naris now stable.Platelets continuing to downtrend. S/p 1 unit of platelets given. PLts stable at 35. -Replace Plts <10, or if bleeding reoccurs - trend CBC/coags   Hypokalemia/ Hypernatremia/hypophosphotemia - trend UOP/ daily renal panel -Replete potassium/phos - Continue free water for sodium >150, will start D5W at 75 cc/hr for 12 hours. - Repeat BMET tomorrow.  Best practice:  Diet: TF Pain/Anxiety/Delirium protocol (if indicated): Fentanyl VAP protocol (if indicated): continue VAP protocol DVT prophylaxis: SCDs GI prophylaxis: PPI Glucose control: CBG q 4 Mobility: BR Code Status: Partial Family Communication: I will speak to the husband today to give him an update. Disposition: ICU  Labs   CBC: Recent Labs  Lab 12/30/19 0308 12/30/19 0308 12/31/19 0426 12/31/19 0826 12/31/19 1311 12/31/19 2322 01/01/20 0348 01/02/20 0323 01/02/20 0522 01/03/20 0620  WBC 25.3*  --  33.6*  --   --   --  28.4* 25.7*  --  25.7*  HGB 10.7*   < > 9.3*   < > 7.9* 7.1* 7.7* 7.5*  --  8.1*  HCT 33.5*   < > 29.3*   < > 25.2* 22.9* 25.7* 25.4*  --  27.5*  MCV 109.5*  --  111.4*  --   --   --  117.9* 118.1*  --  120.1*  PLT 57*   < > 52*  --   --   --  29* 26* 30* 35*   < > = values in this interval  not displayed.    Basic Metabolic Panel: Recent Labs  Lab 12/30/19 0308 12/30/19 0308 12/31/19 0426 12/31/19 2035 01/01/20 0348 01/02/20 0323 01/03/20 0620  NA 139   < > 150* 158* 153* 153* 147*  K 4.2   < > 2.9* 3.4* 3.2* 3.8 3.8  CL 110  --  117*  --  121* 122* 115*  CO2 18*  --  23  --  22 24 24   GLUCOSE 125*  --  93  --  132* 123* 127*  BUN 8  --  11  --  11 11 5*  CREATININE 1.09*  --  0.86  --  0.65 0.62 0.50  CALCIUM 8.8*  --  9.2  --  8.7* 8.6* 8.8*  MG  --   --  2.7*  --  2.5* 2.4 2.2  PHOS  --   --  2.8  --  2.1* 2.7 2.6   < > = values  in this interval not displayed.   GFR: Estimated Creatinine Clearance: 80.7 mL/min (by C-G formula based on SCr of 0.5 mg/dL). Recent Labs  Lab 12/31/19 0426 01/01/20 0348 01/02/20 0323 01/03/20 0620  WBC 33.6* 28.4* 25.7* 25.7*    Liver Function Tests: Recent Labs  Lab 12/30/19 0308 12/31/19 0426 01/01/20 0348 01/02/20 0323 01/03/20 0620  AST 193* 279* 225* 207* 214*  ALT 32 39 38 39 47*  ALKPHOS 201* 178* 139* 151* 175*  BILITOT 22.9* 24.7* 22.2* 18.7* 17.1*  PROT 7.9 7.5 6.9 6.8 7.1  ALBUMIN 1.6* 2.3* 2.9* 2.6* 2.4*   No results for input(s): LIPASE, AMYLASE in the last 168 hours. Recent Labs  Lab 12/29/19 0506 12/30/19 0308 12/31/19 0426  AMMONIA 158* 198* 197*    ABG    Component Value Date/Time   PHART 7.448 12/31/2019 0826   PCO2ART 38.2 12/31/2019 0826   PO2ART 346 (H) 12/31/2019 0826   HCO3 26.3 12/31/2019 0826   TCO2 27 12/31/2019 0826   ACIDBASEDEF 2.5 (H) 12/30/2019 1655   O2SAT 100.0 12/31/2019 0826     Coagulation Profile: Recent Labs  Lab 12/29/19 0506 12/31/19 0426 12/31/19 1311 01/02/20 0323 01/02/20 0522  INR 1.9* 1.8* 2.0* 1.9* 1.8*    Cardiac Enzymes: No results for input(s): CKTOTAL, CKMB, CKMBINDEX, TROPONINI in the last 168 hours.  HbA1C: Hgb A1c MFr Bld  Date/Time Value Ref Range Status  09/29/2018 03:23 PM 4.8 4.8 - 5.6 % Final    Comment:             Prediabetes: 5.7 - 6.4          Diabetes: >6.4          Glycemic control for adults with diabetes: <7.0     CBG: Recent Labs  Lab 01/03/20 1132 01/03/20 1522 01/03/20 1943 01/03/20 2342 01/04/20 0345  GLUCAP 113* 127* 153* 139* 126*    Review of Systems:   Unable to obtain due to intubation  Past Medical History  She,  has a past medical history of Alcohol abuse, Anxiety, Hypertension, and Liver disease.   Surgical History    Past Surgical History:  Procedure Laterality Date  . ECTOPIC PREGNANCY SURGERY    . ESOPHAGOGASTRODUODENOSCOPY (EGD) WITH  PROPOFOL N/A 12/27/2019   Procedure: ESOPHAGOGASTRODUODENOSCOPY (EGD) WITH PROPOFOL;  Surgeon: Carol Ada, MD;  Location: [facility];  Service: Endoscopy;  Laterality: N/A;     Social History   reports that she has never smoked. She has never used smokeless tobacco. She reports current alcohol use.  She reports that she does not use drugs.   Family History   Her family history includes Alcohol abuse in her father and mother; Diabetes in her father; Healthy in her brother, brother, brother, and mother; Hypertension in her maternal grandfather and maternal grandmother.   Allergies No Known Allergies   Home Medications  Prior to Admission medications   Medication Sig Start Date End Date Taking? Authorizing Provider  hydrOXYzine (ATARAX/VISTARIL) 25 MG tablet Take 25 mg by mouth every 6 (six) hours as needed for anxiety.   Yes [provider]  Multiple Vitamins-Minerals (MULTIVITAMIN WITH MINERALS) tablet Take 1 tablet by mouth daily.   Yes [provider]  topiramate (TOPAMAX) 200 MG tablet Take 200 mg by mouth 2 (two) times daily. 10/09/19  Yes [provider]  disulfiram (ANTABUSE) 250 MG tablet Take 250 mg by mouth daily.    [provider]  folic acid (FOLVITE) 1 MG tablet Take 1 mg by mouth daily.    [provider]  Multiple Vitamin (MULTIVITAMIN WITH MINERALS) TABS tablet Take 1 tablet by mouth 2 (two) times a week.     [provider]  traZODone (DESYREL) 50 MG tablet Take 50 mg by mouth at bedtime.    [provider]  venlafaxine (EFFEXOR) 75 MG tablet Take 75 mg by mouth daily.    [provider]     Critical care time:     Marianna Payment, D.O. [facility], PGY-1 Pager: [phone], Phone: [phone] Date 01/04/2020 Time 6:32 AM

## 2020-01-04 NOTE — Progress Notes (Signed)
Daily Progress Note   Patient Name: Joann [name]       Date: 01/04/2020 DOB: April 08, 1981  Age: 39 y.o. MRN#: [id number] Attending Physician: Audria Nine, DO Primary Care Physician: Nelwyn Salisbury, Hershal Coria Admit Date: 12/23/2019  Reason for Consultation/Follow-up: Establishing goals of care  Subjective: Per chart review patient is more awake with nonpurposeful movements. She does not wake to my voice or touch- is not moving currently.  Noted improving labs- recalculated Child-Pugh Class 3 with 45% one year survival, however Madrey's discriminant function is 59 indicating poor prognosis with 30-50% one month mortality. Last ammonia level was 4/22 significantly elevated at 197. No family at bedside.  ROS  Length of Stay: 11  Current Medications: Scheduled Meds:  . chlorhexidine gluconate (MEDLINE KIT)  15 mL Mouth Rinse BID  . Chlorhexidine Gluconate Cloth  6 each Topical Daily  . feeding supplement (PRO-STAT SUGAR FREE 64)  30 mL Per Tube Daily  . feeding supplement (VITAL 1.5 CAL)  1,000 mL Per Tube Q24H  . folic acid  1 mg Per Tube Daily  . free water  200 mL Per Tube Q4H  . lactulose  20 g Per Tube Q8H  . mouth rinse  15 mL Mouth Rinse 10 times per day  . midodrine  5 mg Per Tube TID WC  . multivitamin  15 mL Oral Daily  . pantoprazole sodium  40 mg Per Tube Daily  . prednisoLONE  40 mg Per Tube Daily  . rifaximin  550 mg Per Tube BID  . sodium chloride flush  3 mL Intravenous Q12H  . thiamine  100 mg Per Tube Daily  . zinc sulfate  220 mg Per Tube Daily    Continuous Infusions: . sodium chloride 250 mL (01/04/20 1227)  . dexmedetomidine (PRECEDEX) IV infusion 0.4 mcg/kg/hr (01/04/20 1200)  . piperacillin-tazobactam (ZOSYN)  IV 3.375 g (01/04/20 1438)    PRN  Meds: albuterol, fentaNYL (SUBLIMAZE) injection, fentaNYL (SUBLIMAZE) injection, LORazepam, ondansetron **OR** ondansetron (ZOFRAN) IV, sodium chloride flush  Physical Exam Vitals and nursing note reviewed.  Constitutional:      Appearance: She is ill-appearing.  Pulmonary:     Comments: intubated Neurological:     Comments: nonresponsive             Vital Signs: BP (!) 87/54   Pulse Marland Kitchen)  52   Temp (!) 97.4 F (36.3 C) (Axillary)   Resp 18   Ht 5' 4" (1.626 m)   Wt 130.1 kg   SpO2 100%   BMI 49.23 kg/m  SpO2: SpO2: 100 % O2 Device: O2 Device: Ventilator O2 Flow Rate: O2 Flow Rate (L/min): 2 L/min  Intake/output summary:   Intake/Output Summary (Last 24 hours) at 01/04/2020 1439 Last data filed at 01/04/2020 1227 Gross per 24 hour  Intake 1684.97 ml  Output 1750 ml  Net -65.03 ml   LBM: Last BM Date: 01/04/20 Baseline Weight: Weight: 62.8 kg Most recent weight: Weight: 130.1 kg       Palliative Assessment/Data: PPS: 10%     Patient Active Problem List   Diagnosis Date Noted  . Acute respiratory failure ([place])   . Palliative care encounter   . Elevated bilirubin   . Ascites   . Advanced care planning/counseling discussion   . Goals of care, counseling/discussion   . Palliative care by specialist   . Hypoalbuminemia   . Hepatic encephalopathy ([place]) 12/24/2019  . Alcoholic hepatitis without ascites   . Hypokalemia due to inadequate potassium intake 04/02/2019  . Alcohol withdrawal ([place]) 10/24/2018  . Alcohol dependence with withdrawal, unspecified ([place]) 10/24/2018  . Elevated LFTs 10/24/2018  . Thrombocytopenia ([place]) 10/24/2018  . Alcohol-induced acute pancreatitis   . Acute blood loss anemia   . Alcoholic ketoacidosis 77/41/4239  . Alcohol abuse with alcohol-induced mood disorder ([place]) 09/14/2017  . Elevated liver enzymes 04/29/2017    Palliative Care Assessment & Plan   Patient Profile: 39 y.o.femalewith past medical history of alcoholic  hepatitis, chronic alcoholism (with previous hospitalizations), alcohol pancreatitis,admitted on4/14/2021with confusion, poor po intake and findings of acute hepatic encephalopathy, hepatic cirrhosis, acute blood loss anemia due to upper GI bleeding. MELD 33 on admission, [place] alcoholic hepatitis score 9. During admission ammonia level has been refractory to lactulose treatment. EGD showed grade 2 varices- no active bleeding- however patient has had episodes of hematemesis and bloody stools. Now with significant epistaxis. Initially with acute kidney injury- but this improved. Not a candidate for liver transplant due to ETOH use. On 4/22 she was found unresponsive and intubated for airway protection. Palliative medicine consulted for goals of care.   Assessment/Recommendations/Plan   Patient with advanced alcoholic hepatitis- showing some minimal improvement- but predictive calculators indicate very poor prognosis  Patient's spouse is committed to continued full scope care  PMT will continue to follow  Goals of Care and Additional Recommendations:  Limitations on Scope of Treatment: Full Scope Treatment  Code Status:  Limited code  Prognosis:   Unable to determine  Discharge Planning:  To Be Determined   Thank you for allowing the Palliative Medicine Team to assist in the care of this patient.   Time In: 1215 Time Out: 1250 Total Time 35 mins Prolonged Time Billed no      Greater than 50%  of this time was spent counseling and coordinating care related to the above assessment and plan.  Mariana Kaufman, AGNP-C Palliative Medicine   Please contact Palliative Medicine Team phone at [phone] for questions and concerns.

## 2020-01-04 NOTE — Progress Notes (Addendum)
Progress Note   Subjective  Chief Complaint: Severe alcoholic hepatitis, hepatic encephalopathy  Intubated.  No acute change overnight per nursing staff.  Does now have spontaneous movement of all 4 extremities opening eyelids to sound in pain and having some purposeful movements per previous physicians notes.  Continues with good bowel movements.   Objective   Vital signs in last 24 hours: Temp:  [97.1 F (36.2 C)-98.1 F (36.7 C)] 97.4 F (36.3 C) (04/26 1147) Pulse Rate:  [44-88] 50 (04/26 1127) Resp:  [18-23] 18 (04/26 1127) BP: (76-126)/(49-105) 86/58 (04/26 1127) SpO2:  [98 %-100 %] 100 % (04/26 1127) FiO2 (%):  [30 %-40 %] 40 % (04/26 1127) Weight:  [130.1 kg] 130.1 kg (04/26 0500) Last BM Date: 01/03/20 General:    AA female intubated, critically ill on ventilator Heart:  Regular rate and rhythm; no murmurs Lungs: MV breath sounds Abdomen: Soft, distended Neurologic: Eyes are closed, unresponsive at time my exam  Intake/Output from previous day: 04/25 0701 - 04/26 0700 In: 1757.7 [I.V.:99.5; NG/GT:1550; IV Piggyback:108.2] Out: 3225 [Urine:1550; Stool:1675] Intake/Output this shift: Total I/O In: 590.8 [I.V.:35.4; Other:400; NG/GT:75; IV Piggyback:80.4] Out: -   Lab Results: Recent Labs    01/02/20 0323 01/02/20 0323 01/02/20 0522 01/03/20 0620 01/04/20 0653  WBC 25.7*  --   --  25.7* 21.0*  HGB 7.5*  --   --  8.1* 7.8*  HCT 25.4*  --   --  27.5* 26.6*  PLT 26*   < > 30* 35* 38*   < > = values in this interval not displayed.   BMET Recent Labs    01/02/20 0323 01/03/20 0620 01/04/20 0653  NA 153* 147* 147*  K 3.8 3.8 4.1  CL 122* 115* 115*  CO2 24 24 22   GLUCOSE 123* 127* 124*  BUN 11 5* 14  CREATININE 0.62 0.50 0.58  CALCIUM 8.6* 8.8* 8.7*   Hepatic Function Latest Ref Rng & Units 01/04/2020 01/03/2020 01/02/2020  Total Protein 6.5 - 8.1 g/dL 6.6 7.1 6.8  Albumin 3.5 - 5.0 g/dL 2.3(L) 2.4(L) 2.6(L)  AST 15 - 41 U/L 205(H) 214(H) 207(H)    ALT 0 - 44 U/L 47(H) 47(H) 39  Alk Phosphatase 38 - 126 U/L 174(H) 175(H) 151(H)  Total Bilirubin 0.3 - 1.2 mg/dL 15.1(H) 17.1(H) 18.7(HH)  Bilirubin, Direct 0.0 - 0.2 mg/dL - - -    PT/INR Recent Labs    01/02/20 0323 01/02/20 0522  LABPROT 21.3* 20.3*  INR 1.9* 1.8*    Assessment / Plan:   Assessment: 1.  Acute hypoxic respiratory failure in the setting of nosebleed encephalopathy with presumed aspiration 2.  Encephalopathy: mixed hepatic plus/minus alcohol withdrawal in the setting of acute decompensated cirrhosis 3.  Alcoholic hepatitis: Day 10 prednisolone, midodrine in place, Sandostatin stopped 4/19, overall discriminant function improved from admission,AST/ALT trending up again over past couple days, bilirubin imrpoving  Plan: 1.  Continue supportive treatment 2.  Continue Prednisolone x28 days 3.  No new GI recommendations 4.  Please await any further recommendations from Dr. Bryan Lemma later today  Thank you for kind consultation, we will continue to follow MWF.   LOS: 11 days   Levin Erp  01/04/2020, 12:02 PM

## 2020-01-05 ENCOUNTER — Encounter (HOSPITAL_COMMUNITY): Payer: Self-pay | Admitting: Internal Medicine

## 2020-01-05 DIAGNOSIS — L8990 Pressure ulcer of unspecified site, unspecified stage: Secondary | ICD-10-CM | POA: Insufficient documentation

## 2020-01-05 LAB — COMPREHENSIVE METABOLIC PANEL
ALT: 54 U/L — ABNORMAL HIGH (ref 0–44)
AST: 191 U/L — ABNORMAL HIGH (ref 15–41)
Albumin: 2.1 g/dL — ABNORMAL LOW (ref 3.5–5.0)
Alkaline Phosphatase: 166 U/L — ABNORMAL HIGH (ref 38–126)
Anion gap: 7 (ref 5–15)
BUN: 15 mg/dL (ref 6–20)
CO2: 22 mmol/L (ref 22–32)
Calcium: 8.6 mg/dL — ABNORMAL LOW (ref 8.9–10.3)
Chloride: 116 mmol/L — ABNORMAL HIGH (ref 98–111)
Creatinine, Ser: 0.56 mg/dL (ref 0.44–1.00)
GFR calc Af Amer: >60 mL/min (ref >60)
GFR calc non Af Amer: >60 mL/min (ref >60)
Glucose, Bld: 134 mg/dL — ABNORMAL HIGH (ref 70–99)
Potassium: 3.6 mmol/L (ref 3.5–5.1)
Sodium: 145 mmol/L (ref 135–145)
Total Bilirubin: 11.8 mg/dL — ABNORMAL HIGH (ref 0.3–1.2)
Total Protein: 6.1 g/dL — ABNORMAL LOW (ref 6.5–8.1)

## 2020-01-05 LAB — GLUCOSE, CAPILLARY
Glucose-Capillary: 108 mg/dL — ABNORMAL HIGH (ref 70–99)
Glucose-Capillary: 117 mg/dL — ABNORMAL HIGH (ref 70–99)
Glucose-Capillary: 121 mg/dL — ABNORMAL HIGH (ref 70–99)
Glucose-Capillary: 122 mg/dL — ABNORMAL HIGH (ref 70–99)
Glucose-Capillary: 134 mg/dL — ABNORMAL HIGH (ref 70–99)
Glucose-Capillary: 149 mg/dL — ABNORMAL HIGH (ref 70–99)

## 2020-01-05 LAB — CBC
HCT: 25.9 % — ABNORMAL LOW (ref 36.0–46.0)
Hemoglobin: 7.7 g/dL — ABNORMAL LOW (ref 12.0–15.0)
MCH: 35 pg — ABNORMAL HIGH (ref 26.0–34.0)
MCHC: 29.7 g/dL — ABNORMAL LOW (ref 30.0–36.0)
MCV: 117.7 fL — ABNORMAL HIGH (ref 80.0–100.0)
Platelets: 38 10*3/uL — ABNORMAL LOW (ref 150–400)
RBC: 2.2 MIL/uL — ABNORMAL LOW (ref 3.87–5.11)
WBC: 18.4 10*3/uL — ABNORMAL HIGH (ref 4.0–10.5)
nRBC: 0.4 % — ABNORMAL HIGH (ref 0.0–0.2)

## 2020-01-05 LAB — CULTURE, RESPIRATORY W GRAM STAIN

## 2020-01-05 MED ORDER — LORAZEPAM 2 MG/ML IJ SOLN
INTRAMUSCULAR | Status: AC
  Filled 2020-01-05: qty 1

## 2020-01-05 MED ORDER — LORAZEPAM 2 MG/ML IJ SOLN
1.0000 mg | INTRAMUSCULAR | Status: DC | PRN
  Administered 2020-01-05: 1 mg via INTRAVENOUS
  Administered 2020-01-06: 2 mg via INTRAVENOUS
  Filled 2020-01-05: qty 1

## 2020-01-05 MED ORDER — ORAL CARE MOUTH RINSE
15.0000 mL | Freq: Two times a day (BID) | OROMUCOSAL | Status: DC
  Administered 2020-01-05 – 2020-01-08 (×6): 15 mL via OROMUCOSAL

## 2020-01-05 MED ORDER — PREDNISOLONE 5 MG PO TABS
40.0000 mg | ORAL_TABLET | Freq: Every day | ORAL | Status: DC
  Administered 2020-01-05 – 2020-01-08 (×4): 40 mg
  Filled 2020-01-05 (×4): qty 8

## 2020-01-05 NOTE — Progress Notes (Signed)
NAME:  Joann [name], MRN:  [id number], DOB:  November 25, 1980, LOS: 12 ADMISSION DATE:  12/23/2019, CONSULTATION DATE: 12/31/2019 REFERRING MD:  Dr. Myna Hidalgo, CHIEF COMPLAINT:  AMS   Brief History   11 yoF with ETOH abuse and alcoholic hepatitis presenting with worsening mental status found to have acute hepatic encephalopathy, anemia, AKI, and coagulapathy. GI following. Mental status has waxed/ waned however developed bleeding from suspected nares 4/22 and with ongoing low GCS, PCCM consulted over concerns of airway protection. Patient was intubated and transferred to ICU.  History of present illness   HPI obtained from medical chart review as patient is encephalopathic.   39 year old female with hx of HTN, ETOH abuse, alcohol pancreatitis, and withdrawal seizures admitted to [facility] on 4/14 with worsening mental status after binge drinking, found to have elevated LFTs, and concerns for active GI bleeding with anemia. GI consulted. Underwent EGD on 4/18 which showed non bleeding grade 2 varices. Currently on prednisolone (started 4/17), lactulose, rifaximin, and midodrine. Sandostatin stopped 4/19. She has been empirically on ceftriaxone for leukocytosis with blood cultures with no growth to date. Her AKI and LFTs have improved, however worsening ammonia levels despite increased dosing of lactulose. Mental status has been waxing and wanning, however she had a clear CXR 4/21 and reassuring ABG. Per RN, patient unresponsive throughout the night but developed bleeding from her nares. Unable to be suctioned orally given clenched teeth. PCCM consulted given concern for airway protection and active bleeding. Patient was intubated and transferred to ICU. Merocel sponge placed in right naris to aid in hemostasis.  Past Medical History  HTN, ETOH abuse, alcohol pancreatitis, withdrawal seizures Significant Hospital Events   4/14 Ad[facility] to [facility] 4/18 EGD 4/22 Admitted to ICU  Consults:  GI PMT   Procedures:  4/18 EGD 4/22 EEG  Significant Diagnostic Tests:  4/15 [facility] >> neg 4/18 EGD >> grade 2 varices, large amount of food in the stomach 4/19 limited abd [place] >> Minimal abdominal ascites most prominent in the right upper Quadrant. 4/21 >> CXR and CTH unremarkable 4/22 >> CXR reveals: Progressive bibasilar atelectasis/infiltrates. Bilateral interstitial prominence again noted suggesting interstitial edema and/or pneumonitis.  Micro Data:  4/15 SARS 2/ Flu A/B >> neg 4/17 BCx 2 >>NGTD @ 5 days 4/22 >> Tracheal Aspirate, rare budding yeast 4/25 Resp culture>   Antimicrobials:  4/14 ceftriaxone >> 4/22 4/22 zosyn >> 4/26 4/22 vanc >>4/23  Interim history/subjective:  Continues to having improved mental status. Overnight report states that patient is more alert and able to follow commands. She gets anxious when sedation is weaned. On exam today, she was sedated.   Objective   Blood pressure 100/66, pulse (!) 49, temperature (!) 97.5 F (36.4 C), temperature source Axillary, resp. rate 20, height 5' 4"  (1.626 m), weight 68 kg, SpO2 99 %.    Vent Mode: PRVC FiO2 (%):  [40 %] 40 % Set Rate:  [20 bmp] 20 bmp Vt Set:  [470 mL] 470 mL PEEP:  [5 cmH20] 5 cmH20 Pressure Support:  [8 cmH20] 8 cmH20 Plateau Pressure:  [22 cmH20-25 cmH20] 22 cmH20   Intake/Output Summary (Last 24 hours) at 01/05/2020 0633 Last data filed at 01/05/2020 0500 Gross per 24 hour  Intake 3867.55 ml  Output 975 ml  Net 2892.55 ml   Filed Weights   01/02/20 0421 01/04/20 0500 01/05/20 0326  Weight: 65.1 kg 130.1 kg 68 kg    Examination: General:Critically ill on ventilator. Neuro: Eyes are open, but unresponsive, does  open eyes and move extremities spontaneously  HEENT:Scleral icterus, Dried blood around nasal packing, right naris appears hemostatic,mildoral secretions with bite block in place. CV:RRR,S1/S2 heart sounds. PULM:MV breath sounds, NWOB on pressure support. GI:  distended, round  Extremities:warm and dry, noperipheraledema  Skin:generalizedicterus   Lines/Tubes: 3/47 Rt Basilic midline - clean and dry, without erythema 4/18 Lt forearm - clean and dry, without erythema 4/18 Rectal tube - good stool output, liquid stool 4/25 External urethral catheter - good urine output 4/22 ETT - some epistaxis, bite block in place 4/21 NG/OR tube - in place, clean and dry  Resolved Hospital Problem list     Assessment & Plan:  Acute hypoxic respiratory failure in the setting of nose bleed and encephalopathy with presumed aspiration: CXR 4/22 + for bibasilar atelectasis/infiltrates - Continue MV, wean sedation today for SBT and possibly extubation. -Mentation improving we will try spontaneous breathing trials and extubate when her mental status improves more. -Continue PPI - Continue Zosyn, pending repeat respiratory cultures. - VAP protocol, bronchial hygiene, etc.  - Albuterol q4 prn - Fentanyl for pain and sedation as needed  Encephalopathy- mixed hepatic +/- ETOH withdrawal in the setting of acute decompensated cirrhosis:CTH unremarkable 4/15, EEG 4/22 reveals no seizure like activity,continues to be unresponsive today.  -Continuelactulose TID decreased 20 mg and rifaximin -Continue thiamine/ folate - Oral bite block in place  Leukocytosis: - stable trend, despite steroids (started 4/17), marked jump 4/22, now stable at 25.7. Likely related to steroids, but difficult to rule out aspiration pneumonia. - Continue Zosyn, Fins[place] 5 day course. - BC no growth, Resp Cx show some yeast - Repeat Resp Cx negative.  - trend WBC/ fever curve   Alcohol Hepatitis: - initial MELD score 31/33 on admit, with mortality rate 52.6%, now MELD on 26 - Lillie score < 0.45 and will continue prednisolonefor 28 days. On day 10.  -Continue midodrine - minimal ascites on 4/19 [place].  Macrocytic anemia: - trending CBC, transfuse for Hgb <7   Coagulopathy/ thrombocytopenia:secondary to alcoholic hepatitis/ liver cirrhosis. Acute bleedingfromR naris now stable.Platelets continuing to downtrend. S/p 1 unit of platelets given. PLts stable at 35. -Replace Plts <10, or if bleeding reoccurs - trend CBC/coags   Hypokalemia/ Hypernatremia/hypophosphotemia - trend UOP/ daily renal panel -Replete potassium/phos - Continue free water for sodium >150, will start D5W at 75 cc/hr for 12 hours. - Repeat BMET tomorrow.  Best practice:  Diet: TF Pain/Anxiety/Delirium protocol (if indicated): Fentanyl and Precedex VAP protocol (if indicated): continue VAP protocol DVT prophylaxis: SCDs GI prophylaxis: PPI Glucose control: CBG q 4 Mobility: BR Code Status: Partial Family Communication: I will speak to the husband today to give him an update. Disposition: ICU  Labs   CBC: Recent Labs  Lab 12/31/19 0426 12/31/19 0426 12/31/19 0826 12/31/19 2322 01/01/20 0348 01/02/20 0323 01/02/20 0522 01/03/20 0620 01/04/20 0653  WBC 33.6*  --   --   --  28.4* 25.7*  --  25.7* 21.0*  HGB 9.3*  --    < > 7.1* 7.7* 7.5*  --  8.1* 7.8*  HCT 29.3*  --    < > 22.9* 25.7* 25.4*  --  27.5* 26.6*  MCV 111.4*  --   --   --  117.9* 118.1*  --  120.1* 119.3*  PLT 52*   < >  --   --  29* 26* 30* 35* 38*   < > = values in this interval not displayed.    Basic Metabolic Panel: Recent Labs  Lab 12/31/19 0426 12/31/19 0426 12/31/19 0826 01/01/20 0348 01/02/20 0323 01/03/20 0620 01/04/20 0653  NA 150*   < > 158* 153* 153* 147* 147*  K 2.9*   < > 3.4* 3.2* 3.8 3.8 4.1  CL 117*  --   --  121* 122* 115* 115*  CO2 23  --   --  22 24 24 22   GLUCOSE 93  --   --  132* 123* 127* 124*  BUN 11  --   --  11 11 5* 14  CREATININE 0.86  --   --  0.65 0.62 0.50 0.58  CALCIUM 9.2  --   --  8.7* 8.6* 8.8* 8.7*  MG 2.7*  --   --  2.5* 2.4 2.2  --   PHOS 2.8  --   --  2.1* 2.7 2.6  --    < > = values in this interval not displayed.   GFR: Estimated  Creatinine Clearance: 88.5 mL/min (by C-G formula based on SCr of 0.58 mg/dL). Recent Labs  Lab 01/01/20 0348 01/02/20 0323 01/03/20 0620 01/04/20 0653  WBC 28.4* 25.7* 25.7* 21.0*    Liver Function Tests: Recent Labs  Lab 12/31/19 0426 01/01/20 0348 01/02/20 0323 01/03/20 0620 01/04/20 0653  AST 279* 225* 207* 214* 205*  ALT 39 38 39 47* 47*  ALKPHOS 178* 139* 151* 175* 174*  BILITOT 24.7* 22.2* 18.7* 17.1* 15.1*  PROT 7.5 6.9 6.8 7.1 6.6  ALBUMIN 2.3* 2.9* 2.6* 2.4* 2.3*   No results for input(s): LIPASE, AMYLASE in the last 168 hours. Recent Labs  Lab 12/30/19 0308 12/31/19 0426  AMMONIA 198* 197*    ABG    Component Value Date/Time   PHART 7.448 12/31/2019 0826   PCO2ART 38.2 12/31/2019 0826   PO2ART 346 (H) 12/31/2019 0826   HCO3 26.3 12/31/2019 0826   TCO2 27 12/31/2019 0826   ACIDBASEDEF 2.5 (H) 12/30/2019 1655   O2SAT 100.0 12/31/2019 0826     Coagulation Profile: Recent Labs  Lab 12/31/19 0426 12/31/19 1311 01/02/20 0323 01/02/20 0522  INR 1.8* 2.0* 1.9* 1.8*    Cardiac Enzymes: No results for input(s): CKTOTAL, CKMB, CKMBINDEX, TROPONINI in the last 168 hours.  HbA1C: Hgb A1c MFr Bld  Date/Time Value Ref Range Status  09/29/2018 03:23 PM 4.8 4.8 - 5.6 % Final    Comment:             Prediabetes: 5.7 - 6.4          Diabetes: >6.4          Glycemic control for adults with diabetes: <7.0     CBG: Recent Labs  Lab 01/04/20 1146 01/04/20 1505 01/04/20 1912 01/04/20 2311 01/05/20 0321  GLUCAP 126* 125* 144* 137* 134*    Review of Systems:   Unable to obtain due to intubation  Past Medical History  She,  has a past medical history of Alcohol abuse, Anxiety, Hypertension, and Liver disease.   Surgical History    Past Surgical History:  Procedure Laterality Date  . ECTOPIC PREGNANCY SURGERY    . ESOPHAGOGASTRODUODENOSCOPY (EGD) WITH PROPOFOL N/A 12/27/2019   Procedure: ESOPHAGOGASTRODUODENOSCOPY (EGD) WITH PROPOFOL;  Surgeon:  Carol Ada, MD;  Location: [place];  Service: Endoscopy;  Laterality: N/A;     Social History   reports that she has never smoked. She has never used smokeless tobacco. She reports current alcohol use. She reports that she does not use drugs.   Family History   Her family history  includes Alcohol abuse in her father and mother; Diabetes in her father; Healthy in her brother, brother, brother, and mother; Hypertension in her maternal grandfather and maternal grandmother.   Allergies No Known Allergies   Home Medications  Prior to Admission medications   Medication Sig Start Date End Date Taking? Authorizing Provider  hydrOXYzine (ATARAX/VISTARIL) 25 MG tablet Take 25 mg by mouth every 6 (six) hours as needed for anxiety.   Yes [provider]  Multiple Vitamins-Minerals (MULTIVITAMIN WITH MINERALS) tablet Take 1 tablet by mouth daily.   Yes [provider]  topiramate (TOPAMAX) 200 MG tablet Take 200 mg by mouth 2 (two) times daily. 10/09/19  Yes [provider]  disulfiram (ANTABUSE) 250 MG tablet Take 250 mg by mouth daily.    [provider]  folic acid (FOLVITE) 1 MG tablet Take 1 mg by mouth daily.    [provider]  Multiple Vitamin (MULTIVITAMIN WITH MINERALS) TABS tablet Take 1 tablet by mouth 2 (two) times a week.     [provider]  traZODone (DESYREL) 50 MG tablet Take 50 mg by mouth at bedtime.    [provider]  venlafaxine (EFFEXOR) 75 MG tablet Take 75 mg by mouth daily.    [provider]     Critical care time:     Marianna Payment, D.O. [facility], PGY-1 Pager: [phone], Phone: [phone] Date 01/05/2020 Time 6:33 AM

## 2020-01-05 NOTE — Procedures (Signed)
Extubation Procedure Note  Patient Details:   Name: Joann [name] DOB: 12-Oct-1980 MRN: [id number]   Airway Documentation:    Vent end date: 01/05/20 Vent end time: 1118   Evaluation  O2 sats: stable throughout Complications: No apparent complications Patient did tolerate procedure well. Bilateral Breath Sounds: Rhonchi, Diminished   Yes  5l/min [name]   Revonda Standard 01/05/2020, 11:18 AM

## 2020-01-06 DIAGNOSIS — K7011 Alcoholic hepatitis with ascites: Secondary | ICD-10-CM | POA: Diagnosis not present

## 2020-01-06 DIAGNOSIS — Z01818 Encounter for other preprocedural examination: Secondary | ICD-10-CM

## 2020-01-06 DIAGNOSIS — R410 Disorientation, unspecified: Secondary | ICD-10-CM

## 2020-01-06 DIAGNOSIS — R4182 Altered mental status, unspecified: Secondary | ICD-10-CM

## 2020-01-06 DIAGNOSIS — J9600 Acute respiratory failure, unspecified whether with hypoxia or hypercapnia: Secondary | ICD-10-CM | POA: Diagnosis not present

## 2020-01-06 DIAGNOSIS — G479 Sleep disorder, unspecified: Secondary | ICD-10-CM

## 2020-01-06 DIAGNOSIS — Z7189 Other specified counseling: Secondary | ICD-10-CM | POA: Diagnosis not present

## 2020-01-06 LAB — GLUCOSE, CAPILLARY
Glucose-Capillary: 100 mg/dL — ABNORMAL HIGH (ref 70–99)
Glucose-Capillary: 103 mg/dL — ABNORMAL HIGH (ref 70–99)
Glucose-Capillary: 111 mg/dL — ABNORMAL HIGH (ref 70–99)
Glucose-Capillary: 133 mg/dL — ABNORMAL HIGH (ref 70–99)
Glucose-Capillary: 159 mg/dL — ABNORMAL HIGH (ref 70–99)
Glucose-Capillary: 162 mg/dL — ABNORMAL HIGH (ref 70–99)

## 2020-01-06 LAB — DIFFERENTIAL
Abs Immature Granulocytes: 0 10*3/uL (ref 0.00–0.07)
Basophils Absolute: 0 10*3/uL (ref 0.0–0.1)
Basophils Relative: 0 %
Eosinophils Absolute: 0.2 10*3/uL (ref 0.0–0.5)
Eosinophils Relative: 1 %
Lymphocytes Relative: 4 %
Lymphs Abs: 0.9 10*3/uL (ref 0.7–4.0)
Monocytes Absolute: 0.9 10*3/uL (ref 0.1–1.0)
Monocytes Relative: 4 %
Neutro Abs: 21.1 10*3/uL — ABNORMAL HIGH (ref 1.7–7.7)
Neutrophils Relative %: 91 %
nRBC: 0 /100 WBC

## 2020-01-06 LAB — COMPREHENSIVE METABOLIC PANEL
ALT: 56 U/L — ABNORMAL HIGH (ref 0–44)
AST: 181 U/L — ABNORMAL HIGH (ref 15–41)
Albumin: 2 g/dL — ABNORMAL LOW (ref 3.5–5.0)
Alkaline Phosphatase: 158 U/L — ABNORMAL HIGH (ref 38–126)
Anion gap: 9 (ref 5–15)
BUN: 9 mg/dL (ref 6–20)
CO2: 23 mmol/L (ref 22–32)
Calcium: 8.5 mg/dL — ABNORMAL LOW (ref 8.9–10.3)
Chloride: 106 mmol/L (ref 98–111)
Creatinine, Ser: 0.51 mg/dL (ref 0.44–1.00)
GFR calc Af Amer: >60 mL/min (ref >60)
GFR calc non Af Amer: >60 mL/min (ref >60)
Glucose, Bld: 107 mg/dL — ABNORMAL HIGH (ref 70–99)
Potassium: 3.6 mmol/L (ref 3.5–5.1)
Sodium: 138 mmol/L (ref 135–145)
Total Bilirubin: 10.4 mg/dL — ABNORMAL HIGH (ref 0.3–1.2)
Total Protein: 5.9 g/dL — ABNORMAL LOW (ref 6.5–8.1)

## 2020-01-06 LAB — CBC
HCT: 26.5 % — ABNORMAL LOW (ref 36.0–46.0)
Hemoglobin: 8.1 g/dL — ABNORMAL LOW (ref 12.0–15.0)
MCH: 35.8 pg — ABNORMAL HIGH (ref 26.0–34.0)
MCHC: 30.6 g/dL (ref 30.0–36.0)
MCV: 117.3 fL — ABNORMAL HIGH (ref 80.0–100.0)
Platelets: 58 10*3/uL — ABNORMAL LOW (ref 150–400)
RBC: 2.26 MIL/uL — ABNORMAL LOW (ref 3.87–5.11)
WBC: 23.2 10*3/uL — ABNORMAL HIGH (ref 4.0–10.5)
nRBC: 0.4 % — ABNORMAL HIGH (ref 0.0–0.2)

## 2020-01-06 LAB — PATHOLOGIST SMEAR REVIEW

## 2020-01-06 MED ORDER — ENSURE ENLIVE PO LIQD
237.0000 mL | Freq: Two times a day (BID) | ORAL | Status: DC
  Administered 2020-01-07 – 2020-01-08 (×3): 237 mL via ORAL

## 2020-01-06 MED ORDER — ADULT MULTIVITAMIN W/MINERALS CH
1.0000 | ORAL_TABLET | Freq: Every day | ORAL | Status: DC
  Administered 2020-01-06 – 2020-01-08 (×3): 1 via ORAL
  Filled 2020-01-06 (×3): qty 1

## 2020-01-06 MED ORDER — TRAZODONE HCL 50 MG PO TABS
25.0000 mg | ORAL_TABLET | Freq: Every day | ORAL | Status: DC
  Administered 2020-01-06 – 2020-01-07 (×2): 25 mg via ORAL
  Filled 2020-01-06 (×2): qty 1

## 2020-01-06 NOTE — Progress Notes (Signed)
Occupational Therapy Evaluation Patient Details Name: Joann [name] MRN: [id number] DOB: 1981-09-09 Today's Date: 01/06/2020    History of Present Illness 35 yoF with ETOH abuse and alcoholic hepatitis presenting with worsening mental status found to have acute hepatic encephalopathy, anemia, AKI, and coagulapathy.  GI following.  Mental status has waxed/ waned however developed bleeding from suspected nares 4/22 and with ongoing low GCS, PCCM consulted over concerns of airway protection. Patient was intubated and transferred to ICU. Extubated 4/27.    Clinical Impression   PTA, pt independent with ADL and mobility and lived with her husband who works as a Media planner and "her boys". Pt enjoys cooking. Currently requires Mod A +2 for mobility and Max A with LB ADL due to deficits listed below. Pt with significant cognitive impairment with poor insight into her medical situation and her ability to function safely at home. Pt said multiple times throughout assessement that she was "going home AMA". Able to redirect pt to functional tasks. VSS throughout session. Daughter present during evaluation and states that pt will have 24/7 S after DC. At this time recommend rehab at [facility]. Will follow acutely.     Follow Up Recommendations  CIR;Supervision/Assistance - 24 hour    Equipment Recommendations  3 in 1 bedside commode    Recommendations for Other Services Rehab consult     Precautions / Restrictions Precautions Precautions: Fall Precaution Comments: rectal foley; coretrack Restrictions Weight Bearing Restrictions: No      Mobility Bed Mobility Overal bed mobility: Needs Assistance Bed Mobility: Supine to Sit     Supine to sit: Mod assist     General bed mobility comments: HOB elevated; A for trnasition of trunka dn to scoot hips to EOB  Transfers Overall transfer level: Needs assistance   Transfers: Sit to/from [facility] Sit to Stand: Mod assist;+2  physical assistance Stand pivot transfers: Mod assist;+2 physical assistance       General transfer comment: Pt stating that she could "do it". Pt unabl eot lift buttocks form bed; poor problem solving when tryint to stand    Balance Overall balance assessment: Needs assistance Sitting-balance support: Feet supported Sitting balance-Leahy Scale: Poor Sitting balance - Comments: L lateral lean; pt unaware     Standing balance-Leahy Scale: Poor Standing balance comment: reliant on external support                           ADL either performed or assessed with clinical judgement   ADL Overall ADL's : Needs assistance/impaired Eating/Feeding: Minimal assistance; poor attention affecting her ability to sustain attention to task; unaware of spilling applesauce on herself   Grooming: Moderate assistance   Upper Body Bathing: Moderate assistance;Sitting   Lower Body Bathing: Moderate assistance;Sit to/from stand   Upper Body Dressing : Moderate assistance;Sitting   Lower Body Dressing: Maximal assistance;Sit to/from stand   Toilet Transfer: Moderate assistance;+2 for physical assistance;Stand-pivot;Cueing for safety;Cueing for sequencing   Toileting- Clothing Manipulation and Hygiene: Total assistance Toileting - Clothing Manipulation Details (indicate cue type and reason): rectal foley; total assist for pericare adn to manage clothing     Functional mobility during ADLs: Moderate assistance;+2 for physical assistance General ADL Comments: Poor awareness of safety during ADL tasks; Continues to say "I can do it, I don't need help"     Vision   Additional Comments: will further assess; pt does not wear glasses at baseline; poor depth perception when reaching  for objects     Perception     Praxis      Pertinent Vitals/Pain Pain Assessment: Faces Faces Pain Scale: Hurts little more Pain Location: general discomfort Pain Descriptors / Indicators:  Discomfort;Grimacing Pain Intervention(s): Limited activity within patient's tolerance     Hand Dominance Right   Extremity/Trunk Assessment Upper Extremity Assessment Upper Extremity Assessment: Generalized weakness;RUE deficits/detail;LUE deficits/detail RUE Deficits / Details: able to complete hand to mouth and touch front forehead; unable to reach back of head due to B shoulder weakness; PROM WFL; B weak grip strength RUE Coordination: decreased fine motor;decreased gross motor(states having difficulty holding objects) LUE Deficits / Details: similar to R   Lower Extremity Assessment Lower Extremity Assessment: Defer to PT evaluation   Cervical / Trunk Assessment Cervical / Trunk Assessment: Other exceptions Cervical / Trunk Exceptions: L lateral lean   Communication Communication Communication: No difficulties   Cognition Arousal/Alertness: Awake/alert Behavior During Therapy: Restless;Impulsive;Agitated Overall Cognitive Status: Impaired/Different from baseline Area of Impairment: Attention;Safety/judgement;Awareness;Problem solving                   Current Attention Level: Sustained     Safety/Judgement: Decreased awareness of safety;Decreased awareness of deficits Awareness: Emergent Problem Solving: Slow processing General Comments: poor awareness of medical deficits and affect on ability to complete ADL tasks   General Comments       Exercises     Shoulder Instructions      Home Living Family/patient expects to be discharged to:: Private residence Living Arrangements: Spouse/significant other;Children Available Help at Discharge: Family;Available 24 hours/day Type of Home: House Home Access: Level entry     Home Layout: Two level;Able to live on main level with bedroom/bathroom     Bathroom Shower/Tub: Occupational psychologist: Standard Bathroom Accessibility: Yes How Accessible: Accessible via walker Home Equipment: Numa - 2  wheels;Cane - single point;Bedside commode;Shower seat - built in(unsure of accuracy)          Prior Functioning/Environment Level of Independence: Independent                 OT Problem List: Decreased strength;Decreased activity tolerance;Decreased range of motion;Impaired balance (sitting and/or standing);Decreased coordination;Decreased cognition;Decreased safety awareness;Decreased knowledge of use of DME or AE;Obesity;Impaired UE functional use;Pain      OT Treatment/Interventions: Self-care/ADL training;Therapeutic exercise;Neuromuscular education;Energy conservation;DME and/or AE instruction;Therapeutic activities;Cognitive remediation/compensation;Patient/family education;Balance training    OT Goals(Current goals can be found in the care plan section) Acute Rehab OT Goals Patient Stated Goal: to go home today OT Goal Formulation: With patient/family Time For Goal Achievement: 01/20/20 Potential to Achieve Goals: Good  OT Frequency: Min 2X/week   Barriers to D/C:            Co-evaluation              AM-PAC OT "6 Clicks" Daily Activity     Outcome Measure Help from another person eating meals?: A Little Help from another person taking care of personal grooming?: A Lot Help from another person toileting, which includes using toliet, bedpan, or urinal?: Total Help from another person bathing (including washing, rinsing, drying)?: A Lot Help from another person to put on and taking off regular upper body clothing?: A Lot Help from another person to put on and taking off regular lower body clothing?: A Lot 6 Click Score: 12   End of Session Equipment Utilized During Treatment: Gait belt Nurse Communication: Mobility status  Activity Tolerance: Patient tolerated treatment well Patient left:  in chair;with call bell/phone within reach;with chair alarm set;with family/visitor present  OT Visit Diagnosis: Unsteadiness on feet (R26.81);Other abnormalities of gait  and mobility (R26.89);Muscle weakness (generalized) (M62.81);Other symptoms and signs involving cognitive function;Pain Pain - part of body: (general discomfort)                Time: 8682-5749 OT Time Calculation (min): 44 min Charges:  OT General Charges $OT Visit: 1 Visit OT Evaluation $OT Eval Moderate Complexity: 1 Mod OT Treatments $Self Care/Home Management : 8-22 mins  Maurie Boettcher, OT/L   Acute OT Clinical Specialist [place] Pager [phone] Office [phone]   [facility] 01/06/2020, 2:55 PM

## 2020-01-06 NOTE — Progress Notes (Signed)
Nutrition Follow-up  DOCUMENTATION CODES:   Not applicable  INTERVENTION:    D/C TF order.  Add Ensure Enlive po BID, each supplement provides 350 kcal and 20 grams of protein.  Change liquid MVI to MVI with minerals tablet once daily.  NUTRITION DIAGNOSIS:   Increased nutrient needs related to chronic illness(cirrhosis, hepatitits) as evidenced by estimated needs.  Ongoing   GOAL:   Patient will meet greater than or equal to 90% of their needs  Progressing  MONITOR:   PO intake, Supplement acceptance, Labs  ASSESSMENT:   39 y.o. female history of alcoholic hepatitis, probable liver cirrhosis-longstanding history of alcohol abuse-presenting with confusion-found to have acute hepatic encephalopathy.  Extubated 4/27. Cortrak has been removed.   S/P bedside swallow evaluation with SLP this morning. Diet advanced to regular with thin liquids. When patient was previously on a PO diet, her intake was poor, only consuming 0-50% of meals. She consumed 90% of her clear liquid dinner yesterday. Patient has been confused since extubation, which may impact adequacy of PO intake.   Labs reviewed.  CBG's: 100-111-159  Medications reviewed and include folic acid, lactulose, liquid MVI, thiamine, zinc sulfate.  Weight up to 70 kg today I/O +9.1 L since admission.  Diet Order:   Diet Order            Diet regular Room service appropriate? Yes; Fluid consistency: Thin  Diet effective now              EDUCATION NEEDS:   Not appropriate for education at this time  Skin:  Skin Assessment: Reviewed RN Assessment(MASD to buttocks)  Last BM:  4/28 rectal tube  Height:   Ht Readings from Last 1 Encounters:  12/31/19 5' 4"  (1.626 m)    Weight:   Wt Readings from Last 1 Encounters:  01/06/20 70 kg    BMI:  Body mass index is 26.49 kg/m.  Estimated Nutritional Needs:   Kcal:  1800-2100  Protein:  85-100 gm  Fluid:  1.8 L    Molli Barrows, RD, LDN,  CNSC Please refer to Amion for contact information.

## 2020-01-06 NOTE — Progress Notes (Signed)
Daily Progress Note   Patient Name: Joann [name]       Date: 01/06/2020 DOB: 03/02/1981  Age: 39 y.o. MRN#: [id number] Attending Physician: Audria Nine, DO Primary Care Physician: Minna Antis Admit Date: 12/23/2019  Reason for Consultation/Follow-up: Establishing goals of care  Subjective: Patient is awake, alert. Daughter is at bedside. She is tearful, agitated. Stating she intends to go home "ADH" (meaning AMA). She is oriented to person and place- however, she cannot state or understand the seriousness of her illness. I reviewed her admission with her- she did not recall that she had been intubated and was deathly ill.  She complains of not being able to sleep and feels that night time staff was demeaning to her. She shared that she was very frightened when she woke up in restraints and in an unfamiliar place.  I attempted to discuss goals of care with her, asking how she would feel about being intubated again- however, she became quickly agitated again insisting that she was ready to leave. When I asked her what she expected would happen if she went home now-  or if she wanted to go home even if that meant she might die- she was unable to understand that dying was a possible outcome of her possible choices- she states over and over "I'm fine, I'll be fine". I do not think that she has capacity yet for medical decision making.   Review of Systems  Unable to perform ROS: Mental acuity    Length of Stay: 13  Current Medications: Scheduled Meds:  . Chlorhexidine Gluconate Cloth  6 each Topical Daily  . feeding supplement (PRO-STAT SUGAR FREE 64)  30 mL Per Tube Daily  . feeding supplement (VITAL 1.5 CAL)  1,000 mL Per Tube Q24H  . folic acid  1 mg Per Tube Daily  . free water   200 mL Per Tube Q4H  . lactulose  20 g Per Tube Q8H  . mouth rinse  15 mL Mouth Rinse BID  . midodrine  5 mg Per Tube TID WC  . multivitamin  15 mL Oral Daily  . pantoprazole sodium  40 mg Per Tube Daily  . prednisoLONE  40 mg Per Tube Daily  . rifaximin  550 mg Per Tube BID  . sodium chloride flush  3 mL  Intravenous Q12H  . thiamine  100 mg Per Tube Daily  . traZODone  25 mg Oral QHS  . zinc sulfate  220 mg Per Tube Daily    Continuous Infusions: . sodium chloride 10 mL/hr at 01/06/20 1200    PRN Meds: albuterol, fentaNYL (SUBLIMAZE) injection, fentaNYL (SUBLIMAZE) injection, LORazepam, ondansetron **OR** ondansetron (ZOFRAN) IV, sodium chloride flush  Physical Exam Vitals and nursing note reviewed.  Constitutional:      General: She is not in acute distress. Eyes:     General: Scleral icterus present.  Skin:    Coloration: Skin is jaundiced.  Neurological:     Mental Status: She is alert.     Comments: Oriented to person and place, not oriented to her situation despite education  Psychiatric:     Comments: Tearful, labile, poor insight and judgement             Vital Signs: BP 118/74 (BP Location: Left Arm)   Pulse (!) 111   Temp 98 F (36.7 C) (Oral)   Resp 17   Ht 5' 4"  (1.626 m)   Wt 70 kg   SpO2 97%   BMI 26.49 kg/m  SpO2: SpO2: 97 % O2 Device: O2 Device: Nasal Cannula O2 Flow Rate: O2 Flow Rate (L/min): 2 L/min  Intake/output summary:   Intake/Output Summary (Last 24 hours) at 01/06/2020 1304 Last data filed at 01/06/2020 1200 Gross per 24 hour  Intake 2777.01 ml  Output 1450 ml  Net 1327.01 ml   LBM: Last BM Date: 01/05/20 Baseline Weight: Weight: 62.8 kg Most recent weight: Weight: 70 kg       Palliative Assessment/Data: PPS: 50%    Flowsheet Rows     Most Recent Value  Intake Tab  Referral Department  Hospitalist  Date Notified  12/30/19  Palliative Care Type  New Palliative care  Reason for referral  Clarify Goals of Care  Date of  Admission  12/23/19  Date first seen by Palliative Care  12/31/19  # of days Palliative referral response time  1 Day(s)  # of days IP prior to Palliative referral  7  Clinical Assessment  Psychosocial & Spiritual Assessment  Palliative Care Outcomes      Patient Active Problem List   Diagnosis Date Noted  . Pressure injury of skin 01/05/2020  . Alcoholic hepatitis with ascites   . Acute respiratory failure ([place])   . Palliative care encounter   . Elevated bilirubin   . Ascites   . Advanced care planning/counseling discussion   . Goals of care, counseling/discussion   . Palliative care by specialist   . Hypoalbuminemia   . Hepatic encephalopathy ([place]) 12/24/2019  . Alcoholic hepatitis without ascites   . Hypokalemia due to inadequate potassium intake 04/02/2019  . Alcohol withdrawal ([place]) 10/24/2018  . Alcohol dependence with withdrawal, unspecified ([place]) 10/24/2018  . Elevated LFTs 10/24/2018  . Thrombocytopenia ([place]) 10/24/2018  . Alcohol-induced acute pancreatitis   . Acute blood loss anemia   . Alcoholic ketoacidosis 77/41/2878  . Alcohol abuse with alcohol-induced mood disorder ([place]) 09/14/2017  . Elevated liver enzymes 04/29/2017    Palliative Care Assessment & Plan   Patient Profile: 39 y.o.femalewith past medical history of alcoholic hepatitis, chronic alcoholism (with previous hospitalizations), alcohol pancreatitis,admitted on4/14/2021with confusion, poor po intake and findings of acute hepatic encephalopathy, hepatic cirrhosis, acute blood loss anemia due to upper GI bleeding. MELD 33 on admission, [place] alcoholic hepatitis score 9. During admission ammonia level has been refractory to lactulose  treatment. EGD showed grade 2 varices- no active bleeding- however patient has had episodes of hematemesis and bloody stools. Now with significant epistaxis. Initially with acute kidney injury- but this improved. Not a candidate for liver transplant due to ETOH use.  On 4/22 she was found unresponsive and intubated for airway protection. Palliative medicine consulted for goals of care.  Assessment/Recommendations/Plan   Mental status is improving  Patient very tearful and agitated at times, does not have insight into her medical situation despite attempts to educate- she cannot state consequences of decision and I do not feel she has capacity just yet for medical decision  Insomnia and likely untreated depression- trial trazodone 49m QHS  Goals of Care and Additional Recommendations:  Limitations on Scope of Treatment: Full Scope Treatment  Code Status:  Limited code  Prognosis:   Unable to determine  Discharge Planning:  To Be Determined  Care plan was discussed with patient, daughter and patient's RN.  Thank you for allowing the Palliative Medicine Team to assist in the care of this patient.   Time In: 1200 Time Out: 1235 Total Time 35 mins Prolonged Time Billed no      Greater than 50%  of this time was spent counseling and coordinating care related to the above assessment and plan.  KMariana Kaufman AGNP-C Palliative Medicine   Please contact Palliative Medicine Team phone at [phone] questions and concerns.

## 2020-01-06 NOTE — Evaluation (Signed)
Physical Therapy Evaluation Patient Details Name: Joann [name] MRN: [id number] DOB: Jan 08, 1981 Today's Date: 01/06/2020   History of Present Illness  38 yoF with ETOH abuse and alcoholic hepatitis presenting with worsening mental status found to have acute hepatic encephalopathy, anemia, AKI, and coagulapathy.  GI following.  Mental status has waxed/ waned however developed bleeding from suspected nares 4/22 and with ongoing low GCS, PCCM consulted over concerns of airway protection. Patient was intubated and transferred to ICU. Extubated 4/27.   Clinical Impression  Pt admitted with/for AMS and acute hepatic encephalopathy.  Pt generally needed mod assist for basic mobility, but pt in not aware of her needs..  Pt currently limited functionally due to the problems listed. ( See problems list.)   Pt will benefit from PT to maximize function and safety in order to get ready for next venue listed below.     Follow Up Recommendations CIR;Supervision/Assistance - 24 hour    Equipment Recommendations  Other (comment)(TBA)    Recommendations for Other Services Rehab consult     Precautions / Restrictions Precautions Precautions: Fall Precaution Comments: rectal foley; coretrack      Mobility  Bed Mobility Overal bed mobility: Needs Assistance Bed Mobility: Supine to Sit     Supine to sit: Mod assist     General bed mobility comments: HOB elevated; A for trnasition of trunka dn to scoot hips to EOB  Transfers Overall transfer level: Needs assistance   Transfers: Sit to/from [facility] Sit to Stand: Mod assist;+2 physical assistance Stand pivot transfers: Mod assist;+2 physical assistance       General transfer comment: Pt stating that she could "do it". Pt unable to lift buttocks form bed; poor problem solving when trying to stand.  pt transfered bed to [facility] and BSC to the recliner.  Ambulation/Gait             General Gait Details: pt not able  today  Stairs            Wheelchair Mobility    Modified Rankin (Stroke Patients Only)       Balance Overall balance assessment: Needs assistance Sitting-balance support: Feet supported Sitting balance-Leahy Scale: Poor Sitting balance - Comments: L lateral lean; pt unaware     Standing balance-Leahy Scale: Poor Standing balance comment: reliant on external support                             Pertinent Vitals/Pain Pain Assessment: Faces Faces Pain Scale: Hurts little more Pain Location: general discomfort Pain Descriptors / Indicators: Discomfort;Grimacing Pain Intervention(s): Limited activity within patient's tolerance    Home Living Family/patient expects to be discharged to:: Private residence Living Arrangements: Spouse/significant other;Children Available Help at Discharge: Family;Available 24 hours/day Type of Home: House Home Access: Level entry     Home Layout: Two level;Able to live on main level with bedroom/bathroom Home Equipment: Gilford Rile - 2 wheels;Cane - single point;Bedside commode;Shower seat - built in(unsure of accuracy)      Prior Function Level of Independence: Independent               Hand Dominance   Dominant Hand: Right    Extremity/Trunk Assessment   Upper Extremity Assessment Upper Extremity Assessment: Generalized weakness RUE Deficits / Details: able to complete hand to mouth and touch front forehead; unable to reach back of head due to B shoulder weakness; PROM WFL; B weak grip strength RUE Coordination: (states having  difficulty holding objects) LUE Deficits / Details: similar to R    Lower Extremity Assessment Lower Extremity Assessment: Generalized weakness    Cervical / Trunk Assessment Cervical / Trunk Assessment: Other exceptions Cervical / Trunk Exceptions: L lateral lean  Communication   Communication: No difficulties  Cognition Arousal/Alertness: Awake/alert Behavior During Therapy:  Restless;Impulsive;Agitated Overall Cognitive Status: Impaired/Different from baseline Area of Impairment: Attention;Safety/judgement;Awareness;Problem solving                   Current Attention Level: Sustained     Safety/Judgement: Decreased awareness of safety;Decreased awareness of deficits Awareness: Emergent Problem Solving: Slow processing General Comments: poor awareness of medical deficits adn affect on ability to complete ADL tasks      General Comments General comments (skin integrity, edema, etc.): pt very focused on getting out of the hospital.  " I'm going AMA.Marland KitchenMarland KitchenI'm not staying hear.    Exercises Other Exercises Other Exercises: warm up ROM    Assessment/Plan    PT Assessment Patient needs continued PT services  PT Problem List Decreased strength;Decreased activity tolerance;Decreased balance;Decreased mobility;Decreased knowledge of use of DME;Pain       PT Treatment Interventions DME instruction;Gait training;Functional mobility training;Therapeutic activities;Therapeutic exercise;Balance training;Patient/family education    PT Goals (Current goals can be found in the Care Plan section)  Acute Rehab PT Goals Patient Stated Goal: to go home today PT Goal Formulation: With patient Time For Goal Achievement: 01/20/20 Potential to Achieve Goals: Good    Frequency Min 3X/week   Barriers to discharge        Co-evaluation PT/OT/SLP Co-Evaluation/Treatment: Yes Reason for Co-Treatment: Complexity of the patient's impairments (multi-system involvement);For patient/therapist safety PT goals addressed during session: Mobility/safety with mobility OT goals addressed during session: ADL's and self-care       AM-PAC PT "6 Clicks" Mobility  Outcome Measure Help needed turning from your back to your side while in a flat bed without using bedrails?: A Little Help needed moving from lying on your back to sitting on the side of a flat bed without using  bedrails?: A Lot Help needed moving to and from a bed to a chair (including a wheelchair)?: A Lot Help needed standing up from a chair using your arms (e.g., wheelchair or bedside chair)?: A Lot Help needed to walk in hospital room?: A Lot Help needed climbing 3-5 steps with a railing? : A Lot 6 Click Score: 13    End of Session Equipment Utilized During Treatment: Gait belt;Oxygen Activity Tolerance: Patient tolerated treatment well;Patient limited by fatigue Patient left: in chair;with call bell/phone within reach;with chair alarm set;with family/visitor present Nurse Communication: Mobility status;Precautions PT Visit Diagnosis: Muscle weakness (generalized) (M62.81);Other abnormalities of gait and mobility (R26.89);Difficulty in walking, not elsewhere classified (R26.2)    Time: 8676-7209 PT Time Calculation (min) (ACUTE ONLY): 38 min   Charges:   PT Evaluation $PT Eval Moderate Complexity: 1 Mod          01/06/2020  Ginger Carne., PT Acute Rehabilitation Services [phone]  (pager) [phone]  (office)  Tessie Fass [place] 01/06/2020, 4:35 PM

## 2020-01-06 NOTE — Evaluation (Signed)
Clinical/Bedside Swallow Evaluation Patient Details  Name: Joann [name] MRN: [id number] Date of Birth: 1981-04-26  Today's Date: 01/06/2020 Time: SLP Start Time (ACUTE ONLY): 1021 SLP Stop Time (ACUTE ONLY): 1038 SLP Time Calculation (min) (ACUTE ONLY): 17 min  Past Medical History:  Past Medical History:  Diagnosis Date  . Alcohol abuse   . Anxiety   . Hypertension   . Liver disease    Past Surgical History:  Past Surgical History:  Procedure Laterality Date  . ECTOPIC PREGNANCY SURGERY    . ESOPHAGOGASTRODUODENOSCOPY (EGD) WITH PROPOFOL N/A 12/27/2019   Procedure: ESOPHAGOGASTRODUODENOSCOPY (EGD) WITH PROPOFOL;  Surgeon: Carol Ada, MD;  Location: [place];  Service: Endoscopy;  Laterality: N/A;   HPI:  71 with history of ETOH abuse, alcoholic hepatitis, alcohol pancreatitis, withdrawal seizures, presenting with worsening mental status found to have acute hepatic encephalopathy, anemia, AKI, and coagulapathy. Underwent EGD on 4/18 which showed non bleeding grade 2 varices. Bleeding from suspected nares 4/22. Intubated 4/22-4/27.   Assessment / Plan / Recommendation Clinical Impression  Pt frustrated, crying and wanting to leave AMA. Therapist able to redirect with solid, thin liquids in which she showed no indications of decreased airway protection. Managing secretions well, not congested and relatively normal vocal quality and intensity following 6 day intubation. Prolonged intubation is increased risk for aspiration therefore will intervening closely as she initiates recommendation for regular texture, thin liquids, pills with water, straws allowed.      SLP Visit Diagnosis: Dysphagia, unspecified (R13.10)    Aspiration Risk  Mild aspiration risk    Diet Recommendation Regular;Thin liquid   Liquid Administration via: Cup;Straw Medication Administration: Whole meds with puree Supervision: Patient able to self feed;Intermittent supervision to cue for compensatory  strategies Compensations: Slow rate;Minimize environmental distractions;Small sips/bites Postural Changes: Seated upright at 90 degrees    Other  Recommendations Oral Care Recommendations: Oral care BID   Follow up Recommendations 24 hour supervision/assistance      Frequency and Duration min 2x/week  2 weeks       Prognosis Prognosis for Safe Diet Advancement: Good Barriers to Reach Goals: Behavior      Swallow Study   General HPI: 56 with history of ETOH abuse, alcoholic hepatitis, alcohol pancreatitis, withdrawal seizures, presenting with worsening mental status found to have acute hepatic encephalopathy, anemia, AKI, and coagulapathy. Underwent EGD on 4/18 which showed non bleeding grade 2 varices. Bleeding from suspected nares 4/22. Intubated 4/22-4/27. Type of Study: Bedside Swallow Evaluation Previous Swallow Assessment: (none) Diet Prior to this Study: NPO;NG Tube Temperature Spikes Noted: No Respiratory Status: Room air History of Recent Intubation: Yes Length of Intubations (days): 6 days Date extubated: 01/05/20 Behavior/Cognition: Alert;Requires cueing;Distractible;Other (Comment)(crying, frustrated) Oral Cavity Assessment: Within Functional Limits Oral Care Completed by SLP: No Oral Cavity - Dentition: Adequate natural dentition Vision: Functional for self-feeding Self-Feeding Abilities: Able to feed self;Needs set up Patient Positioning: Upright in bed Baseline Vocal Quality: Normal    Oral/Motor/Sensory Function Overall Oral Motor/Sensory Function: Within functional limits   Ice Chips Ice chips: Not tested   Thin Liquid Thin Liquid: Within functional limits Presentation: Straw    Nectar Thick Nectar Thick Liquid: Not tested   Honey Thick Honey Thick Liquid: Not tested   Puree Puree: Within functional limits   Solid     Solid: Within functional limits      Houston Siren 01/06/2020,11:07 AM Orbie Pyo Colvin Caroli.Ed Chief Technology Officer [phone] Office [phone]

## 2020-01-06 NOTE — Progress Notes (Signed)
CSW Spoke with patient at bedside. CSW offered patient Substance abuse outpatient treatment services resources. Patient accepted resources.  CSW will continue to follow.

## 2020-01-06 NOTE — Progress Notes (Signed)
NAME:  Joann [name], MRN:  [id number], DOB:  1981/04/15, LOS: 52 ADMISSION DATE:  12/23/2019, CONSULTATION DATE: 12/31/2019 REFERRING MD:  Dr. Myna Hidalgo, CHIEF COMPLAINT:  AMS   Brief History   71 yoF with ETOH abuse and alcoholic hepatitis presenting with worsening mental status found to have acute hepatic encephalopathy, anemia, AKI, and coagulapathy. GI following. Mental status has waxed/ waned however developed bleeding from suspected nares 4/22 and with ongoing low GCS, PCCM consulted over concerns of airway protection. Patient was intubated and transferred to ICU.  History of present illness   HPI obtained from medical chart review as patient is encephalopathic.   39 year old female with hx of HTN, ETOH abuse, alcohol pancreatitis, and withdrawal seizures admitted to [facility] on 4/14 with worsening mental status after binge drinking, found to have elevated LFTs, and concerns for active GI bleeding with anemia. GI consulted. Underwent EGD on 4/18 which showed non bleeding grade 2 varices. Currently on prednisolone (started 4/17), lactulose, rifaximin, and midodrine. Sandostatin stopped 4/19.   She has been empirically on ceftriaxone for leukocytosis with blood cultures with no growth to date. Her AKI and LFTs have improved, however worsening ammonia levels despite increased dosing of lactulose. Mental status has been waxing and wanning, however she had a clear CXR 4/21 and reassuring ABG. Per RN, patient unresponsive throughout the night but developed bleeding from her nares. Unable to be suctioned orally given clenched teeth. PCCM consulted given concern for airway protection and active bleeding. Patient was intubated and transferred to ICU. Merocel sponge placed in right naris to aid in hemostasis.  Past Medical History  HTN, ETOH abuse, alcohol pancreatitis, withdrawal seizures  Significant Hospital Events   4/14 Admitted to [facility] 4/18 EGD 4/22 Admitted to ICU 4/27  Extubated  Consults:  GI PMT  Procedures:  4/18 EGD 4/21 NG tube placed> 4/22 EEG 4/22 ETT> 4/27 4/25 Urethral catheter>  Significant Diagnostic Tests:  4/15 [facility] >> neg 4/18 EGD >> grade 2 varices, large amount of food in the stomach 4/19 limited abd [place] >> Minimal abdominal ascites most prominent in the right upper Quadrant. 4/21 >> CXR and CTH unremarkable 4/22 >> CXR reveals: Progressive bibasilar atelectasis/infiltrates. Bilateral interstitial prominence again noted suggesting interstitial edema and/or pneumonitis.  Micro Data:  4/15 SARS 2/ Flu A/B >> neg 4/17 BCx 2 >>NGTD @ 5 days 4/22 >> Tracheal Aspirate, rare budding yeast 4/25 Resp culture>   Antimicrobials:  4/14 ceftriaxone >> 4/22 4/22 zosyn >> 4/26 4/22 vanc >>4/23  Interim history/subjective:  Patient is alert and orient, but has poor insight and judgement into her medical condition. She is saturating well on room and and tolerating PO intake.   Objective   Blood pressure 103/64, pulse 90, temperature 98.1 F (36.7 C), temperature source Oral, resp. rate (!) 21, height 5' 4"  (1.626 m), weight 70 kg, SpO2 99 %.   Intake/Output Summary (Last 24 hours) at 01/06/2020 0620 Last data filed at 01/06/2020 0538 Gross per 24 hour  Intake 3055.48 ml  Output 1450 ml  Net 1605.48 ml   Filed Weights   01/04/20 0500 01/05/20 0326 01/06/20 0522  Weight: 130.1 kg 68 kg 70 kg    Examination: General:Ill appearing female Neuro: Alert and orient and appears neurovascularly intact with generalized weakness. HEENT:Scleral icterus improving CV:RRR,S1/S2 heart sounds. PULM:clear to ausculation bilaterally. Extremities:warm and dry, noperipheraledema  Skin:generalizedicterus   Lines/Tubes: Rt Basilic midline - clean and dry, without erythema Lt forearm - clean and dry, without erythema External  urethral catheter - good urine output NG/OR tube - in place, clean and dry  Resolved Hospital  Problem list   Acute Hypoxic respiratory failure (4/27)  Assessment & Plan:   Acute ETOH Hepatitis: - Lillie score < 0.45 and will continue prednisolonefor 28 days. - Continue Thiamine and folate   Leukocytosis: - Elevated leukocytosis today, will get a differential today and continue to trend.  - She is afebrile and normotensive.   Acute decompensated cirrhosis: - initial MELD score 31/33 on admit, with mortality rate 52.6%, now MELD on 26 - Continue lactulose TID at 20 mg and Rifaximin  -Continue midodrine - minimal ascites on 4/19 [place].  Macrocytic anemia: - trending CBC, transfuse for Hgb <7  Coagulopathy/ thrombocytopenia:secondary to alcoholic hepatitis/ liver cirrhosis. Acute bleedingfromR naris now stable.Platelets continuing to downtrend. S/p 1 unit of platelets given. PLts improved at >50 today. -Replace Plts <10, or if bleeding reoccurs - trend CBC/coags   Best practice:  Diet: Advance diet as tolerated Pain/Anxiety/Delirium protocol (if indicated): n/a VAP protocol (if indicated): n/a DVT prophylaxis: SCDs GI prophylaxis: Glucose control: CBG q 4  Mobility: PT/OT Code Status: Partial Family Communication: I will speak to the family today Disposition: Transfer our of ICU  Labs   CBC: Recent Labs  Lab 01/02/20 0323 01/02/20 0323 01/02/20 0522 01/03/20 0620 01/04/20 0653 01/05/20 0725 01/06/20 0348  WBC 25.7*  --   --  25.7* 21.0* 18.4* 23.2*  HGB 7.5*  --   --  8.1* 7.8* 7.7* 8.1*  HCT 25.4*  --   --  27.5* 26.6* 25.9* 26.5*  MCV 118.1*  --   --  120.1* 119.3* 117.7* 117.3*  PLT 26*   < > 30* 35* 38* 38* 58*   < > = values in this interval not displayed.    Basic Metabolic Panel: Recent Labs  Lab 12/31/19 0426 12/31/19 0826 01/01/20 0348 01/02/20 0323 01/03/20 0620 01/04/20 0653 01/05/20 0725  NA 150*   < > 153* 153* 147* 147* 145  K 2.9*   < > 3.2* 3.8 3.8 4.1 3.6  CL 117*  --  121* 122* 115* 115* 116*  CO2 23  --  22 24 24  22 22   GLUCOSE 93  --  132* 123* 127* 124* 134*  BUN 11  --  11 11 5* 14 15  CREATININE 0.86  --  0.65 0.62 0.50 0.58 0.56  CALCIUM 9.2  --  8.7* 8.6* 8.8* 8.7* 8.6*  MG 2.7*  --  2.5* 2.4 2.2  --   --   PHOS 2.8  --  2.1* 2.7 2.6  --   --    < > = values in this interval not displayed.   GFR: Estimated Creatinine Clearance: 89.7 mL/min (by C-G formula based on SCr of 0.56 mg/dL). Recent Labs  Lab 01/03/20 0620 01/04/20 0653 01/05/20 0725 01/06/20 0348  WBC 25.7* 21.0* 18.4* 23.2*    Liver Function Tests: Recent Labs  Lab 01/01/20 0348 01/02/20 0323 01/03/20 0620 01/04/20 0653 01/05/20 0725  AST 225* 207* 214* 205* 191*  ALT 38 39 47* 47* 54*  ALKPHOS 139* 151* 175* 174* 166*  BILITOT 22.2* 18.7* 17.1* 15.1* 11.8*  PROT 6.9 6.8 7.1 6.6 6.1*  ALBUMIN 2.9* 2.6* 2.4* 2.3* 2.1*   No results for input(s): LIPASE, AMYLASE in the last 168 hours. Recent Labs  Lab 12/31/19 0426  AMMONIA 197*    ABG    Component Value Date/Time   PHART 7.448 12/31/2019 6283  PCO2ART 38.2 12/31/2019 0826   PO2ART 346 (H) 12/31/2019 0826   HCO3 26.3 12/31/2019 0826   TCO2 27 12/31/2019 0826   ACIDBASEDEF 2.5 (H) 12/30/2019 1655   O2SAT 100.0 12/31/2019 0826     Coagulation Profile: Recent Labs  Lab 12/31/19 0426 12/31/19 1311 01/02/20 0323 01/02/20 0522  INR 1.8* 2.0* 1.9* 1.8*    Cardiac Enzymes: No results for input(s): CKTOTAL, CKMB, CKMBINDEX, TROPONINI in the last 168 hours.  HbA1C: Hgb A1c MFr Bld  Date/Time Value Ref Range Status  09/29/2018 03:23 PM 4.8 4.8 - 5.6 % Final    Comment:             Prediabetes: 5.7 - 6.4          Diabetes: >6.4          Glycemic control for adults with diabetes: <7.0     CBG: Recent Labs  Lab 01/05/20 1121 01/05/20 1516 01/05/20 1944 01/05/20 2356 01/06/20 0340  GLUCAP 117* 149* 108* 121* 103*    Review of Systems:   Unable to obtain due to intubation  Past Medical History  She,  has a past medical history of  Alcohol abuse, Anxiety, Hypertension, and Liver disease.   Surgical History    Past Surgical History:  Procedure Laterality Date  . ECTOPIC PREGNANCY SURGERY    . ESOPHAGOGASTRODUODENOSCOPY (EGD) WITH PROPOFOL N/A 12/27/2019   Procedure: ESOPHAGOGASTRODUODENOSCOPY (EGD) WITH PROPOFOL;  Surgeon: Carol Ada, MD;  Location: [place] [facility];  Service: Endoscopy;  Laterality: N/A;     Social History   reports that she has never smoked. She has never used smokeless tobacco. She reports current alcohol use. She reports that she does not use drugs.   Family History   Her family history includes Alcohol abuse in her father and mother; Diabetes in her father; Healthy in her brother, brother, brother, and mother; Hypertension in her maternal grandfather and maternal grandmother.   Allergies No Known Allergies   Home Medications  Prior to Admission medications   Medication Sig Start Date End Date Taking? Authorizing Provider  hydrOXYzine (ATARAX/VISTARIL) 25 MG tablet Take 25 mg by mouth every 6 (six) hours as needed for anxiety.   Yes [provider]  Multiple Vitamins-Minerals (MULTIVITAMIN WITH MINERALS) tablet Take 1 tablet by mouth daily.   Yes [provider]  topiramate (TOPAMAX) 200 MG tablet Take 200 mg by mouth 2 (two) times daily. 10/09/19  Yes [provider]  disulfiram (ANTABUSE) 250 MG tablet Take 250 mg by mouth daily.    [provider]  folic acid (FOLVITE) 1 MG tablet Take 1 mg by mouth daily.    [provider]  Multiple Vitamin (MULTIVITAMIN WITH MINERALS) TABS tablet Take 1 tablet by mouth 2 (two) times a week.     [provider]  traZODone (DESYREL) 50 MG tablet Take 50 mg by mouth at bedtime.    [provider]  venlafaxine (EFFEXOR) 75 MG tablet Take 75 mg by mouth daily.    [provider]     Critical care time:     Marianna Payment, D.O. [facility], PGY-1 Pager: [phone],  Phone: [phone] Date 01/06/2020 Time 6:20 AM

## 2020-01-06 NOTE — Progress Notes (Signed)
Daily Rounding Note  01/06/2020, 2:28 PM  LOS: 13 days   SUBJECTIVE:   Chief complaint:   Alcoholic hepatitis, HE  Today's Pall care note mentions pt, who is now awake/alert/extubated, expresses intention to go home, leave AMA.  She tells me the same thing.  Says people are being mean to her. Says she walked in the hallway today Eating solid food, spaghetti for lunch, but core track tube remains in place for enteric medication access as there is concern she will refuse medications. Flexi-Seal rectal catheter also in place. SLP bedside swallow evaluation with mild aspiration risk, regular diet, thin liquids, meds with pure's recommended.  OBJECTIVE:         Vital signs in last 24 hours:    Temp:  [97.3 F (36.3 C)-98.2 F (36.8 C)] 98 F (36.7 C) (04/28 1157) Pulse Rate:  [76-111] 111 (04/28 1200) Resp:  [15-25] 25 (04/28 1300) BP: (100-122)/(59-96) 121/81 (04/28 1300) SpO2:  [97 %-100 %] 97 % (04/28 1200) FiO2 (%):  [40 %] 40 % (04/28 0400) Weight:  [70 kg] 70 kg (04/28 0522) Last BM Date: 01/05/20 Filed Weights   01/04/20 0500 01/05/20 0326 01/06/20 0522  Weight: 130.1 kg 68 kg 70 kg   General: Core track feeding tube in place. Heart: RRR. Chest: Clear bilaterally although breath sounds are generally reduced.  No dyspnea, no cough. Abdomen: Soft.  Not tender, active bowel sounds.  Fullness in right upper quadrant.  Watery light to medium brown stool in Flexi-Seal bag. Extremities: No CCE.  General muscle wasting of limbs. Neuro/Psych: Although she is oriented to place, year.  She is not able to tell me exactly why she is here.  She gets emotionally labile stating she wants to go home. Has some lingering tremor versus asterixis in the hands.  Intake/Output from previous day: 04/27 0701 - 04/28 0700 In: 3115.5 [P.O.:720; I.V.:325.5; NG/GT:2070] Out: 1450 [Urine:750; Emesis/NG output:200; Stool:500]  Intake/Output  this shift: Total I/O In: 450 [I.V.:50; NG/GT:400] Out: -   Lab Results: Recent Labs    01/04/20 0653 01/05/20 0725 01/06/20 0348  WBC 21.0* 18.4* 23.2*  HGB 7.8* 7.7* 8.1*  HCT 26.6* 25.9* 26.5*  PLT 38* 38* 58*   BMET Recent Labs    01/04/20 0653 01/05/20 0725 01/06/20 0717  NA 147* 145 138  K 4.1 3.6 3.6  CL 115* 116* 106  CO2 22 22 23   GLUCOSE 124* 134* 107*  BUN 14 15 9   CREATININE 0.58 0.56 0.51  CALCIUM 8.7* 8.6* 8.5*   LFT Recent Labs    01/04/20 0653 01/05/20 0725 01/06/20 0717  PROT 6.6 6.1* 5.9*  ALBUMIN 2.3* 2.1* 2.0*  AST 205* 191* 181*  ALT 47* 54* 56*  ALKPHOS 174* 166* 158*  BILITOT 15.1* 11.8* 10.4*   PT/INR No results for input(s): LABPROT, INR in the last 72 hours. Hepatitis Panel No results for input(s): HEPBSAG, HCVAB, HEPAIGM, HEPBIGM in the last 72 hours.  Studies/Results: No results found.  ASSESMENT:   *   Alcoholic hepatitis, decompensated cirrhosis.  Multiple previous admissions with acute alcoholic hepatitis, alcoholic pancreatitis, alcoholic ketoacidosis.   Day 12/28 Prednisolone LFTs improved.    *   Severe HE, mental status clearing but pt has not insight On Lactulose and rifaximin.    *   resp failure in setting of presumed aspiration, now extubated.    *   Coagulopathy.  stable Thrombocytopenia.  Improved.    *  Microcytic anemia, chronic. Stable.   No iron, B12, folate deficiency.   12/27/19 EGD: small esoph varices, retained gastric fluid/contents.  No active bleeding  *   Leukocytosis.      PLAN   *   Total ETOH abstinence, has not been able to sustain this in past.    *   Follow up w Roosevelt Locks NP at [facility] in [place] Failed to show for OV with Dr Allen Norris 11/02/19. No plans for [facility] to assume her care.   PMD is Nelwyn Salisbury NP at [facility] in [place].  She has failed to show for multiple appts w ob-gyn.  *   When she does discharge home, unless supervised 24/7, I suspect she will be  walking to the [facility] store which is near her home to purchase alcohol and resume drinking.    Joann [name]  01/06/2020, 2:28 PM Phone [phone]

## 2020-01-07 DIAGNOSIS — Z7189 Other specified counseling: Secondary | ICD-10-CM | POA: Diagnosis not present

## 2020-01-07 DIAGNOSIS — K7011 Alcoholic hepatitis with ascites: Secondary | ICD-10-CM | POA: Diagnosis not present

## 2020-01-07 DIAGNOSIS — Z515 Encounter for palliative care: Secondary | ICD-10-CM | POA: Diagnosis not present

## 2020-01-07 DIAGNOSIS — K7290 Hepatic failure, unspecified without coma: Secondary | ICD-10-CM | POA: Diagnosis not present

## 2020-01-07 LAB — COMPREHENSIVE METABOLIC PANEL
ALT: 60 U/L — ABNORMAL HIGH (ref 0–44)
AST: 169 U/L — ABNORMAL HIGH (ref 15–41)
Albumin: 2 g/dL — ABNORMAL LOW (ref 3.5–5.0)
Alkaline Phosphatase: 164 U/L — ABNORMAL HIGH (ref 38–126)
Anion gap: 7 (ref 5–15)
BUN: 8 mg/dL (ref 6–20)
CO2: 23 mmol/L (ref 22–32)
Calcium: 8.6 mg/dL — ABNORMAL LOW (ref 8.9–10.3)
Chloride: 109 mmol/L (ref 98–111)
Creatinine, Ser: 0.56 mg/dL (ref 0.44–1.00)
GFR calc Af Amer: >60 mL/min (ref >60)
GFR calc non Af Amer: >60 mL/min (ref >60)
Glucose, Bld: 96 mg/dL (ref 70–99)
Potassium: 3.6 mmol/L (ref 3.5–5.1)
Sodium: 139 mmol/L (ref 135–145)
Total Bilirubin: 10.4 mg/dL — ABNORMAL HIGH (ref 0.3–1.2)
Total Protein: 6.5 g/dL (ref 6.5–8.1)

## 2020-01-07 LAB — CBC
HCT: 25.4 % — ABNORMAL LOW (ref 36.0–46.0)
Hemoglobin: 7.7 g/dL — ABNORMAL LOW (ref 12.0–15.0)
MCH: 35.3 pg — ABNORMAL HIGH (ref 26.0–34.0)
MCHC: 30.3 g/dL (ref 30.0–36.0)
MCV: 116.5 fL — ABNORMAL HIGH (ref 80.0–100.0)
Platelets: 51 10*3/uL — ABNORMAL LOW (ref 150–400)
RBC: 2.18 MIL/uL — ABNORMAL LOW (ref 3.87–5.11)
WBC: 18.6 10*3/uL — ABNORMAL HIGH (ref 4.0–10.5)
nRBC: 0 % (ref 0.0–0.2)

## 2020-01-07 LAB — GLUCOSE, CAPILLARY
Glucose-Capillary: 84 mg/dL (ref 70–99)
Glucose-Capillary: 85 mg/dL (ref 70–99)
Glucose-Capillary: 110 mg/dL — ABNORMAL HIGH (ref 70–99)
Glucose-Capillary: 139 mg/dL — ABNORMAL HIGH (ref 70–99)
Glucose-Capillary: 157 mg/dL — ABNORMAL HIGH (ref 70–99)

## 2020-01-07 NOTE — Progress Notes (Signed)
PROGRESS NOTE    Joann [name]  WYO:[id number] DOB: 04/23/1981 DOA: 12/23/2019 PCP: Nelwyn Salisbury, PA-C    Brief Narrative:  39 year old female with alcohol abuse and alcoholic hepatitis who presented with worsening mental status.  She was on the medical floor and treated for acute hepatic encephalopathy, acute alcoholic hepatitis, AKI and coagulopathy.  GI was following.  Mental status has been waxing and waning.  On 4/22, became more lethargic and was intubated and transferred to ICU to protect her airway. 4/14 admitted to [facility] 4/18 upper GI endoscopy, no bleeding lesion found.  Remains on the steroids. 4/22 intubated and admitted to ICU 4/27 extubated to room air 4/28 transfer to medical floor.  Assessment & Plan:   Principal Problem:   Hepatic encephalopathy (HCC) Active Problems:   Alcohol dependence with withdrawal, unspecified (HCC)   Thrombocytopenia (HCC)   Hypokalemia due to inadequate potassium intake   Alcoholic hepatitis without ascites   Ascites   Advanced care planning/counseling discussion   Goals of care, counseling/discussion   Palliative care by specialist   Hypoalbuminemia   Acute respiratory failure ([place])   Palliative care encounter   Elevated bilirubin   Alcoholic hepatitis with ascites   Pressure injury of skin   AMS (altered mental status)   Sleep disturbance  Hepatic encephalopathy/acute alcoholic hepatitis with alcohol dependence with withdrawal and chronic liver disease: Some clinical improvement on mentation. Rocephin, finished 5 days therapy.  Remains on rifaximin, will continue on discharge. Prednisone 40 mg daily, day 13/28, will have prolonged taper if patient remains compliant. Lactulose with aggressive bowel regimen to decrease ammonia levels. Close monitoring of LFTs. Multiple counseling, rehab.  Respiratory failure in the setting of presumed aspiration: Improved.  On room air now.  Leukocytosis: On prednisone.  No evidence of  bacterial infection.  Finished 5 days of Rocephin therapy.  We will continue to monitor.  Coagulopathy/thrombocytopenia secondary to alcohol hepatitis and chronic liver disease: Acute bleeding from right naris, stabilized.  Platelets more than 50,000.  Was given 1 unit of platelets.  We will continue to monitor closely.  Goal of care: Followed by palliative medicine.  Currently patient is determined to quit alcohol.  Work with PT OT and speech.  Refer to physical therapy rehab.   DVT prophylaxis: SCDs Code Status: Partial, no CPR but full scope of treatment Family Communication: None Disposition Plan: Status is: Inpatient  Remains inpatient appropriate because:Inpatient level of care appropriate due to severity of illness   Dispo: The patient is from: Home              Anticipated d/c is to: CIR              Anticipated d/c date is: 2 days              Patient currently is not medically stable to d/c.          Consultants:   Gastroenterology, signed off  PCCM, signed off  Procedures:   EGD, 4/18, no active bleeding.  Antimicrobials:  Antibiotics Given (last 72 hours)    Date/Time Action Medication Dose Rate   01/04/20 1438 New Bag/Given   piperacillin-tazobactam (ZOSYN) IVPB 3.375 g 3.375 g 12.5 mL/hr   01/04/20 2126 Given   rifaximin (XIFAXAN) tablet 550 mg 550 mg    01/04/20 2128 New Bag/Given   piperacillin-tazobactam (ZOSYN) IVPB 3.375 g 3.375 g 12.5 mL/hr   01/05/20 0906 Given   rifaximin (XIFAXAN) tablet 550 mg 550 mg    01/05/20  2238 Given   rifaximin (XIFAXAN) tablet 550 mg 550 mg    01/06/20 1048 Given   rifaximin (XIFAXAN) tablet 550 mg 550 mg    01/06/20 2108 Given   rifaximin (XIFAXAN) tablet 550 mg 550 mg    01/07/20 0926 Given   rifaximin (XIFAXAN) tablet 550 mg 550 mg          Subjective: Patient seen and examined in the morning rounds.  She was trying to work with physical therapy.  She wanted to talk to her mother on the phone and pray  with her. Patient repeatedly stated that she would like to stay in the hospital and get better. No other overnight events noted.  She is number wanting to leave.  Agreeable to work with therapies.  Objective: Vitals:   01/06/20 2329 01/07/20 0300 01/07/20 0500 01/07/20 0830  BP: 112/65 106/68  109/71  Pulse: 89 90  86  Resp: (!) 23 17  16   Temp: 98.5 F (36.9 C) 98.6 F (37 C)    TempSrc: Oral Oral    SpO2: 99% 98%  98%  Weight:   76 kg   Height:        Intake/Output Summary (Last 24 hours) at 01/07/2020 1127 Last data filed at 01/07/2020 0326 Gross per 24 hour  Intake 140 ml  Output 525 ml  Net -385 ml   Filed Weights   01/05/20 0326 01/06/20 0522 01/07/20 0500  Weight: 68 kg 70 kg 76 kg    Examination:  General exam: Sick looking, tremulous.  Icteric. Respiratory system: Clear to auscultation. Respiratory effort normal.  No added sounds Cardiovascular system: S1 & S2 heard, RRR.  Gastrointestinal system: Abdomen is nondistended, soft and nontender.  Fecal management system with loose stool. Central nervous system: Alert and oriented x3.  Able to move all extremities.  Anxious and flat affect.    Data Reviewed: I have personally reviewed following labs and imaging studies  CBC: Recent Labs  Lab 01/03/20 0620 01/04/20 0653 01/05/20 0725 01/06/20 0348 01/07/20 0534  WBC 25.7* 21.0* 18.4* 23.2* 18.6*  NEUTROABS  --   --   --  21.1*  --   HGB 8.1* 7.8* 7.7* 8.1* 7.7*  HCT 27.5* 26.6* 25.9* 26.5* 25.4*  MCV 120.1* 119.3* 117.7* 117.3* 116.5*  PLT 35* 38* 38* 58* 51*   Basic Metabolic Panel: Recent Labs  Lab 01/01/20 0348 01/01/20 0348 01/02/20 0323 01/02/20 0323 01/03/20 0620 01/04/20 0653 01/05/20 0725 01/06/20 0717 01/07/20 0534  NA 153*   < > 153*   < > 147* 147* 145 138 139  K 3.2*   < > 3.8   < > 3.8 4.1 3.6 3.6 3.6  CL 121*   < > 122*   < > 115* 115* 116* 106 109  CO2 22   < > 24   < > 24 22 22 23 23   GLUCOSE 132*   < > 123*   < > 127* 124*  134* 107* 96  BUN 11   < > 11   < > 5* 14 15 9 8   CREATININE 0.65   < > 0.62   < > 0.50 0.58 0.56 0.51 0.56  CALCIUM 8.7*   < > 8.6*   < > 8.8* 8.7* 8.6* 8.5* 8.6*  MG 2.5*  --  2.4  --  2.2  --   --   --   --   PHOS 2.1*  --  2.7  --  2.6  --   --   --   --    < > =  values in this interval not displayed.   GFR: Estimated Creatinine Clearance: 93.3 mL/min (by C-G formula based on SCr of 0.56 mg/dL). Liver Function Tests: Recent Labs  Lab 01/03/20 0620 01/04/20 0653 01/05/20 0725 01/06/20 0717 01/07/20 0534  AST 214* 205* 191* 181* 169*  ALT 47* 47* 54* 56* 60*  ALKPHOS 175* 174* 166* 158* 164*  BILITOT 17.1* 15.1* 11.8* 10.4* 10.4*  PROT 7.1 6.6 6.1* 5.9* 6.5  ALBUMIN 2.4* 2.3* 2.1* 2.0* 2.0*   No results for input(s): LIPASE, AMYLASE in the last 168 hours. No results for input(s): AMMONIA in the last 168 hours. Coagulation Profile: Recent Labs  Lab 12/31/19 1311 01/02/20 0323 01/02/20 0522  INR 2.0* 1.9* 1.8*   Cardiac Enzymes: No results for input(s): CKTOTAL, CKMB, CKMBINDEX, TROPONINI in the last 168 hours. BNP (last 3 results) No results for input(s): PROBNP in the last 8760 hours. HbA1C: No results for input(s): HGBA1C in the last 72 hours. CBG: Recent Labs  Lab 01/06/20 1526 01/06/20 2114 01/06/20 2331 01/07/20 0322 01/07/20 0834  GLUCAP 159* 133* 162* 84 85   Lipid Profile: No results for input(s): CHOL, HDL, LDLCALC, TRIG, CHOLHDL, LDLDIRECT in the last 72 hours. Thyroid Function Tests: No results for input(s): TSH, T4TOTAL, FREET4, T3FREE, THYROIDAB in the last 72 hours. Anemia Panel: No results for input(s): VITAMINB12, FOLATE, FERRITIN, TIBC, IRON, RETICCTPCT in the last 72 hours. Sepsis Labs: No results for input(s): PROCALCITON, LATICACIDVEN in the last 168 hours.  Recent Results (from the past 240 hour(s))  Culture, respiratory (non-expectorated)     Status: None   Collection Time: 12/31/19 12:14 PM   Specimen: Tracheal Aspirate;  Respiratory  Result Value Ref Range Status   Specimen Description TRACHEAL ASPIRATE  Final   Special Requests NONE  Final   Gram Stain   Final    RARE WBC PRESENT,BOTH PMN AND MONONUCLEAR RARE BUDDING YEAST SEEN Performed at [facility], [place] [address] [place], [place] [address]    Culture RARE CANDIDA KEFYR  Final   Report Status 01/02/2020 FINAL  Final  MRSA PCR Screening     Status: None   Collection Time: 12/31/19  9:16 PM   Specimen: Nasal Mucosa; Nasopharyngeal  Result Value Ref Range Status   MRSA by PCR NEGATIVE NEGATIVE Final    Comment:        The GeneXpert MRSA Assay (FDA approved for NASAL specimens only), is one component of a comprehensive MRSA colonization surveillance program. It is not intended to diagnose MRSA infection nor to guide or monitor treatment for MRSA infections. Performed at [facility], [place] [address], [place], [place] [address]   Culture, respiratory (non-expectorated)     Status: None   Collection Time: 01/03/20 11:36 AM   Specimen: Tracheal Aspirate; Respiratory  Result Value Ref Range Status   Specimen Description TRACHEAL ASPIRATE  Final   Special Requests NONE  Final   Gram Stain   Final    MODERATE WBC PRESENT, PREDOMINANTLY PMN NO SQUAMOUS EPITHELIAL CELLS SEEN RARE BUDDING YEAST SEEN Performed at [facility], [address] [place], [place] [address]    Culture FEW CANDIDA KEFYR  Final   Report Status 01/05/2020 FINAL  Final         Radiology Studies: No results found.      Scheduled Meds: . Chlorhexidine Gluconate Cloth  6 each Topical Daily  . feeding supplement (ENSURE ENLIVE)  237 mL Oral BID BM  . folic acid  1  mg Per Tube Daily  . free water  200 mL Per Tube Q4H  . lactulose  20 g Per Tube Q8H  . mouth rinse  15 mL Mouth Rinse BID  . midodrine  5 mg Per Tube TID WC  . multivitamin with minerals  1 tablet Oral Daily  . pantoprazole sodium  40 mg Per Tube Daily  . prednisoLONE  40 mg  Per Tube Daily  . rifaximin  550 mg Per Tube BID  . sodium chloride flush  3 mL Intravenous Q12H  . thiamine  100 mg Per Tube Daily  . traZODone  25 mg Oral QHS  . zinc sulfate  220 mg Per Tube Daily   Continuous Infusions: . sodium chloride 10 mL/hr at 01/06/20 1200     LOS: 14 days    Time spent: 30 minutes    Barb Merino, MD [facility] Pager [phone]

## 2020-01-07 NOTE — Progress Notes (Signed)
Inpatient Rehabilitation Admissions Coordinator  Inpatient rehab consult received. I met with patient with her daughter at bedside. We discussed goals and expectations of a possible inpatient rehab admit. She is in agreement. I will discuss with rehab MD and follow up tomorrow for dispo planning.   Danne Baxter, RN, MSN Rehab Admissions Coordinator [phone] 01/07/2020 11:48 AM

## 2020-01-07 NOTE — Progress Notes (Signed)
Physical Therapy Treatment Patient Details Name: Joann [name] MRN: [id number] DOB: 03-Jun-1981 Today's Date: 01/07/2020    History of Present Illness 48 yoF with ETOH abuse and alcoholic hepatitis presenting with worsening mental status found to have acute hepatic encephalopathy, anemia, AKI, and coagulapathy.  GI following.  Mental status has waxed/ waned however developed bleeding from suspected nares 4/22 and with ongoing low GCS, PCCM consulted over concerns of airway protection. Patient was intubated and transferred to ICU. Extubated 4/27.     PT Comments    Patient is making progress toward PT goals and eager to continue therapy. Pt presents with generalized weakness and impaired balance requiring min-mod A for all mobility. +2 for safety with gait training. Continue to recommend CIR for further skilled PT services to maximize independence and safety with mobility.     Follow Up Recommendations  CIR;Supervision/Assistance - 24 hour     Equipment Recommendations  Other (comment)(TBA)    Recommendations for Other Services       Precautions / Restrictions Precautions Precautions: Fall Precaution Comments: flexiseal Restrictions Weight Bearing Restrictions: No    Mobility  Bed Mobility Overal bed mobility: Needs Assistance Bed Mobility: Supine to Sit     Supine to sit: Min assist     General bed mobility comments: pt able to get into sitting with use of bed rails; assist needed to scoot hips to EOB; cues to stay on task   Transfers Overall transfer level: Needs assistance Equipment used: Rolling walker (2 wheeled) Transfers: Sit to/from Stand Sit to Stand: Mod assist         General transfer comment: cues for hand placement; assistance required to power up into standing and gain balanace upon stand due to poserior bias initially  Ambulation/Gait Ambulation/Gait assistance: Min assist;Mod assist;+2 safety/equipment Gait Distance (Feet): 100 Feet Assistive device:  Rolling walker (2 wheeled) Gait Pattern/deviations: Step-through pattern;Decreased step length - right;Decreased step length - left;Staggering left;Drifts right/left;Narrow base of support Gait velocity: decreased   General Gait Details: cues for use of RW, maintaining safe proximity to RW, and increased bilat step lengths; assistance required for balance and managing AD; pt with multiple L lateral LOB    Stairs             Wheelchair Mobility    Modified Rankin (Stroke Patients Only)       Balance Overall balance assessment: Needs assistance Sitting-balance support: Feet supported Sitting balance-Leahy Scale: Fair     Standing balance support: Bilateral upper extremity supported;During functional activity Standing balance-Leahy Scale: Poor                              Cognition Arousal/Alertness: Awake/alert Behavior During Therapy: Flat affect Overall Cognitive Status: Impaired/Different from baseline Area of Impairment: Attention;Safety/judgement;Problem solving                   Current Attention Level: Sustained     Safety/Judgement: Decreased awareness of safety;Decreased awareness of deficits   Problem Solving: Slow processing;Difficulty sequencing General Comments: pt fixated on her mother to pray; pt easily distracted and needs cues to attend to mobility tasks      Exercises      General Comments        Pertinent Vitals/Pain Pain Assessment: No/denies pain    Home Living                      Prior Function  PT Goals (current goals can now be found in the care plan section) Progress towards PT goals: Progressing toward goals    Frequency    Min 3X/week      PT Plan Current plan remains appropriate    Co-evaluation              AM-PAC PT "6 Clicks" Mobility   Outcome Measure  Help needed turning from your back to your side while in a flat bed without using bedrails?: A Little Help  needed moving from lying on your back to sitting on the side of a flat bed without using bedrails?: A Lot Help needed moving to and from a bed to a chair (including a wheelchair)?: A Lot Help needed standing up from a chair using your arms (e.g., wheelchair or bedside chair)?: A Lot Help needed to walk in hospital room?: A Lot Help needed climbing 3-5 steps with a railing? : A Lot 6 Click Score: 13    End of Session Equipment Utilized During Treatment: Gait belt Activity Tolerance: Patient tolerated treatment well Patient left: in chair;with call bell/phone within reach;with chair alarm set;with family/visitor present(mother present as therapist was leaving) Nurse Communication: Mobility status;Precautions PT Visit Diagnosis: Muscle weakness (generalized) (M62.81);Other abnormalities of gait and mobility (R26.89);Difficulty in walking, not elsewhere classified (R26.2)     Time: 5035-4656 PT Time Calculation (min) (ACUTE ONLY): 25 min  Charges:  $Gait Training: 23-37 mins                     Earney Navy, PTA Acute Rehabilitation Services Pager: [phone] Office: [phone]     Darliss Cheney 01/07/2020, 2:53 PM

## 2020-01-07 NOTE — Progress Notes (Signed)
Rehab Admissions Coordinator Note:  Patient was screened by Cleatrice Burke for appropriateness for an Inpatient Acute Rehab Consult per therapy recs. .  At this time, we are recommending Inpatient Rehab consult. I will place order per protocol.  Cleatrice Burke RN MSN 01/07/2020, 8:25 AM  I can be reached at [phone].

## 2020-01-07 NOTE — Progress Notes (Signed)
Chaplain engaged in follow-up visit with Briellah.  Magdeline's daughter provided her a lot of support and comfort.  Chaplain aided in creating a safe space for Sharea to share was has been on her mind and allow her to grieve some of the experiences she has endured.  Chaplain offered empathic listening and prayer.  Arther Abbott chaplain for listening.   Verbie stated that chaplain could come back any time.  Chaplain will continue to follow-up.

## 2020-01-07 NOTE — Progress Notes (Signed)
Palliative Medicine RN Note: Mrs Ringwald's mother Derek Jack called the PMT line asking about being allowed to visit [facility] before she left town to go home. [facility] already has two approved visitors, and I explained that I cannot override the hospital policy but that I will speak with the [facility] staff and leadership.  I spoke with Rosamond's RN and the charge RN. They approved a 30 minute visit for Mrs Manus Rudd due to extenuating circumstances regarding Mrs Runk's improvement and removal from the vent, new ability (for this admission) to speak clearly, and mother's plan to return to her home out of town.  I visited Clarice to make sure she was ok with her mother visiting, and she tearfully answered that she wanted her mother to come, and that her mother wanted to come, as she was going to come "pray over her only daughter." Euphemia indicated she had eaten breakfast without assistance (90%, confirmed by me; confirmed w NT that they did not assist). I asked if she was still hungry, but she said no and that she did not want to over eat. She told me that her daughter brought her a Razr cell phone, but I was unable to find it in her linens or personal belongings; I did find a radio with earbuds attached. I asked the NT to look for the phone when they helped her clean up and change her linens.  While I was straightening Lauree's gown and linens, she told me that she wanted to stay in the hospital until she is able to care for herself and does not want to go home Friday. She asked if she had really been asleep for 3 days, and I explained that I was not familiar with her case, but that I knew she had been on the ventilator. 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 She does not remember being on the vent, but she became very tearful and scared and told me that she remembers very scary things that happened during that time. She again asked about her mother coming  up to pray for her.   I let her know that we got the ok for a one-time, limited visit, and she picked up the phone to call her mom before I left the room. When I got back to my office, I returned Mrs Moss's call [phone]) and let her know that she was approved for a one time, time-limited visit and asked when she was coming; she explained she was already on the way (ETA [phone]). I put her information on the flowsheet as a one-time exception and updated the 3W nursing station.  I updated PMT NPs Gregary Signs and Leonard Downing, and I also put in an order for Spiritual Care.  Marjie Skiff Kahlea Cobert, RN, BSN, [facility] Palliative Medicine Team 01/07/2020 9:28 AM Office [phone]

## 2020-01-07 NOTE — Progress Notes (Signed)
Daily Progress Note   Patient Name: Joann [name]       Date: 01/07/2020 DOB: 07-11-1981  Age: 39 y.o. MRN#: [id number] Attending Physician: Barb Merino, MD Primary Care Physician: Minna Antis Admit Date: 12/23/2019  Reason for Consultation/Follow-up: Establishing goals of care  Subjective: Joann [name] with much better mental status today. She had a visit from her Mom and this has brightened her mood. Her daughter is present and states Joann [name] is in a much better place than she was even this morning. Joann [name] said she got her first good night of sleep last night. States she wants to go to inpatient rehab and is very eager to do this. She notes that she thought she would receive rehab for her alcohol abuse at CIR. We discussed that inpatient rehab addresses her physical rehab, but it may be beneficial for her to also consider rehab program for her alcohol addiction after she is discharged from CIR.    ROS  Length of Stay: 14  Current Medications: Scheduled Meds:  . Chlorhexidine Gluconate Cloth  6 each Topical Daily  . feeding supplement (ENSURE ENLIVE)  237 mL Oral BID BM  . folic acid  1 mg Per Tube Daily  . free water  200 mL Per Tube Q4H  . lactulose  20 g Per Tube Q8H  . mouth rinse  15 mL Mouth Rinse BID  . midodrine  5 mg Per Tube TID WC  . multivitamin with minerals  1 tablet Oral Daily  . pantoprazole sodium  40 mg Per Tube Daily  . prednisoLONE  40 mg Per Tube Daily  . rifaximin  550 mg Per Tube BID  . sodium chloride flush  3 mL Intravenous Q12H  . thiamine  100 mg Per Tube Daily  . traZODone  25 mg Oral QHS  . zinc sulfate  220 mg Per Tube Daily    Continuous Infusions: . sodium chloride 10 mL/hr at 01/06/20 1200    PRN Meds: albuterol, fentaNYL (SUBLIMAZE) injection,  fentaNYL (SUBLIMAZE) injection, LORazepam, ondansetron **OR** ondansetron (ZOFRAN) IV, sodium chloride flush  Physical Exam          Vital Signs: BP 109/71   Pulse 86   Temp 98.3 F (36.8 C) (Oral)   Resp 16   Ht 5' 4"  (1.626 m)   Wt  76 kg   SpO2 98%   BMI 28.76 kg/m  SpO2: SpO2: 98 % O2 Device: O2 Device: Room Air O2 Flow Rate: O2 Flow Rate (L/min): 2 L/min  Intake/output summary:   Intake/Output Summary (Last 24 hours) at 01/07/2020 1751 Last data filed at 01/07/2020 1539 Gross per 24 hour  Intake 90 ml  Output 1050 ml  Net -960 ml   LBM: Last BM Date: 01/05/20 Baseline Weight: Weight: 62.8 kg Most recent weight: Weight: 76 kg       Palliative Assessment/Data: PPS: 60%   Flowsheet Rows     Most Recent Value  Intake Tab  Referral Department  Hospitalist  Date Notified  12/30/19  Palliative Care Type  New Palliative care  Reason for referral  Clarify Goals of Care  Date of Admission  12/23/19  Date first seen by Palliative Care  12/31/19  # of days Palliative referral response time  1 Day(s)  # of days IP prior to Palliative referral  7  Clinical Assessment  Psychosocial & Spiritual Assessment  Palliative Care Outcomes      Patient Active Problem List   Diagnosis Date Noted  . AMS (altered mental status)   . Sleep disturbance   . Pressure injury of skin 01/05/2020  . Alcoholic hepatitis with ascites   . Acute respiratory failure ([place])   . Palliative care encounter   . Elevated bilirubin   . Ascites   . Advanced care planning/counseling discussion   . Goals of care, counseling/discussion   . Palliative care by specialist   . Hypoalbuminemia   . Hepatic encephalopathy ([place]) 12/24/2019  . Alcoholic hepatitis without ascites   . Hypokalemia due to inadequate potassium intake 04/02/2019  . Alcohol withdrawal ([place]) 10/24/2018  . Alcohol dependence with withdrawal, unspecified ([place]) 10/24/2018  . Elevated LFTs 10/24/2018  . Thrombocytopenia ([place])  10/24/2018  . Alcohol-induced acute pancreatitis   . Acute blood loss anemia   . Alcoholic ketoacidosis 44/31/5400  . Alcohol abuse with alcohol-induced mood disorder ([place]) 09/14/2017  . Elevated liver enzymes 04/29/2017    Palliative Care Assessment & Plan   Patient Profile: 39 y.o.femalewith past medical history of alcoholic hepatitis, chronic alcoholism (with previous hospitalizations), alcohol pancreatitis,admitted on4/14/2021with confusion, poor po intake and findings of acute hepatic encephalopathy, hepatic cirrhosis, acute blood loss anemia due to upper GI bleeding. MELD 33 on admission, [place] alcoholic hepatitis score 9. During admission ammonia level has been refractory to lactulose treatment. EGD showed grade 2 varices- no active bleeding- however patient has had episodes of hematemesis and bloody stools. Now with significant epistaxis. Initially with acute kidney injury- but this improved. Not a candidate for liver transplant due to ETOH use. On 4/22 she was found unresponsive and intubated for airway protection. Palliative medicine consulted for goals of care.  Assessment/Recommendations/Plan   Continue trazodone- appears to have helped with her sleep  PMT will continue to follow and address overall goals of care and "what if's" when timing is appropriate  Goals of Care and Additional Recommendations:  Limitations on Scope of Treatment: Full Scope Treatment  Code Status:  Limited code  Prognosis:   Unable to determine  Discharge Planning:  CIR  Care plan was discussed with patient.  Thank you for allowing the Palliative Medicine Team to assist in the care of this patient.   Time In: 1600 Time Out: 1635 Total Time 35 minutes Prolonged Time Billed no      Greater than 50%  of this time was spent counseling  and coordinating care related to the above assessment and plan.  Mariana Kaufman, AGNP-C Palliative Medicine   Please contact Palliative Medicine  Team phone at [phone] for questions and concerns.

## 2020-01-08 ENCOUNTER — Inpatient Hospital Stay (HOSPITAL_COMMUNITY)
Admission: RE | Admit: 2020-01-08 | Discharge: 2020-01-16 | DRG: 432 | Disposition: A | Discharge status: Home / Self Care | Payer: Medicaid Other | Source: Intra-hospital | Attending: Physical Medicine & Rehabilitation | Admitting: Physical Medicine & Rehabilitation

## 2020-01-08 ENCOUNTER — Encounter (HOSPITAL_COMMUNITY): Payer: Self-pay | Admitting: Physical Medicine & Rehabilitation

## 2020-01-08 DIAGNOSIS — Z833 Family history of diabetes mellitus: Secondary | ICD-10-CM | POA: Diagnosis not present

## 2020-01-08 DIAGNOSIS — I10 Essential (primary) hypertension: Secondary | ICD-10-CM | POA: Diagnosis present

## 2020-01-08 DIAGNOSIS — D72829 Elevated white blood cell count, unspecified: Secondary | ICD-10-CM

## 2020-01-08 DIAGNOSIS — K7011 Alcoholic hepatitis with ascites: Secondary | ICD-10-CM | POA: Diagnosis present

## 2020-01-08 DIAGNOSIS — D689 Coagulation defect, unspecified: Secondary | ICD-10-CM | POA: Diagnosis present

## 2020-01-08 DIAGNOSIS — K7290 Hepatic failure, unspecified without coma: Secondary | ICD-10-CM

## 2020-01-08 DIAGNOSIS — I951 Orthostatic hypotension: Secondary | ICD-10-CM | POA: Diagnosis not present

## 2020-01-08 DIAGNOSIS — F419 Anxiety disorder, unspecified: Secondary | ICD-10-CM | POA: Diagnosis present

## 2020-01-08 DIAGNOSIS — K7040 Alcoholic hepatic failure without coma: Secondary | ICD-10-CM | POA: Diagnosis present

## 2020-01-08 DIAGNOSIS — D509 Iron deficiency anemia, unspecified: Secondary | ICD-10-CM | POA: Diagnosis present

## 2020-01-08 DIAGNOSIS — F329 Major depressive disorder, single episode, unspecified: Secondary | ICD-10-CM | POA: Diagnosis present

## 2020-01-08 DIAGNOSIS — K7030 Alcoholic cirrhosis of liver without ascites: Secondary | ICD-10-CM | POA: Diagnosis present

## 2020-01-08 DIAGNOSIS — K7010 Alcoholic hepatitis without ascites: Secondary | ICD-10-CM | POA: Diagnosis present

## 2020-01-08 DIAGNOSIS — R7401 Elevation of levels of liver transaminase levels: Secondary | ICD-10-CM | POA: Diagnosis present

## 2020-01-08 DIAGNOSIS — Z8249 Family history of ischemic heart disease and other diseases of the circulatory system: Secondary | ICD-10-CM | POA: Diagnosis not present

## 2020-01-08 DIAGNOSIS — Y92239 Unspecified place in hospital as the place of occurrence of the external cause: Secondary | ICD-10-CM | POA: Diagnosis present

## 2020-01-08 DIAGNOSIS — D539 Nutritional anemia, unspecified: Secondary | ICD-10-CM | POA: Diagnosis present

## 2020-01-08 DIAGNOSIS — F1020 Alcohol dependence, uncomplicated: Secondary | ICD-10-CM | POA: Diagnosis present

## 2020-01-08 DIAGNOSIS — T380X5A Adverse effect of glucocorticoids and synthetic analogues, initial encounter: Secondary | ICD-10-CM | POA: Diagnosis present

## 2020-01-08 DIAGNOSIS — D6959 Other secondary thrombocytopenia: Secondary | ICD-10-CM | POA: Diagnosis present

## 2020-01-08 DIAGNOSIS — Z79899 Other long term (current) drug therapy: Secondary | ICD-10-CM

## 2020-01-08 DIAGNOSIS — Z811 Family history of alcohol abuse and dependence: Secondary | ICD-10-CM | POA: Diagnosis not present

## 2020-01-08 DIAGNOSIS — R739 Hyperglycemia, unspecified: Secondary | ICD-10-CM

## 2020-01-08 DIAGNOSIS — E512 Wernicke's encephalopathy: Secondary | ICD-10-CM | POA: Diagnosis present

## 2020-01-08 DIAGNOSIS — D72823 Leukemoid reaction: Secondary | ICD-10-CM | POA: Diagnosis not present

## 2020-01-08 DIAGNOSIS — K852 Alcohol induced acute pancreatitis without necrosis or infection: Secondary | ICD-10-CM | POA: Diagnosis present

## 2020-01-08 DIAGNOSIS — R4701 Aphasia: Secondary | ICD-10-CM | POA: Diagnosis present

## 2020-01-08 DIAGNOSIS — K7682 Hepatic encephalopathy: Secondary | ICD-10-CM | POA: Diagnosis present

## 2020-01-08 LAB — CBC
HCT: 22.8 % — ABNORMAL LOW (ref 36.0–46.0)
Hemoglobin: 7 g/dL — ABNORMAL LOW (ref 12.0–15.0)
MCH: 35.4 pg — ABNORMAL HIGH (ref 26.0–34.0)
MCHC: 30.7 g/dL (ref 30.0–36.0)
MCV: 115.2 fL — ABNORMAL HIGH (ref 80.0–100.0)
Platelets: 53 10*3/uL — ABNORMAL LOW (ref 150–400)
RBC: 1.98 MIL/uL — ABNORMAL LOW (ref 3.87–5.11)
WBC: 16.9 10*3/uL — ABNORMAL HIGH (ref 4.0–10.5)
nRBC: 0 % (ref 0.0–0.2)

## 2020-01-08 LAB — GLUCOSE, CAPILLARY
Glucose-Capillary: 105 mg/dL — ABNORMAL HIGH (ref 70–99)
Glucose-Capillary: 110 mg/dL — ABNORMAL HIGH (ref 70–99)
Glucose-Capillary: 111 mg/dL — ABNORMAL HIGH (ref 70–99)
Glucose-Capillary: 113 mg/dL — ABNORMAL HIGH (ref 70–99)
Glucose-Capillary: 133 mg/dL — ABNORMAL HIGH (ref 70–99)

## 2020-01-08 LAB — AMMONIA: Ammonia: 56 umol/L — ABNORMAL HIGH (ref 9–35)

## 2020-01-08 LAB — COMPREHENSIVE METABOLIC PANEL
ALT: 55 U/L — ABNORMAL HIGH (ref 0–44)
AST: 131 U/L — ABNORMAL HIGH (ref 15–41)
Albumin: 2.1 g/dL — ABNORMAL LOW (ref 3.5–5.0)
Alkaline Phosphatase: 157 U/L — ABNORMAL HIGH (ref 38–126)
Anion gap: 8 (ref 5–15)
BUN: 6 mg/dL (ref 6–20)
CO2: 22 mmol/L (ref 22–32)
Calcium: 8.6 mg/dL — ABNORMAL LOW (ref 8.9–10.3)
Chloride: 108 mmol/L (ref 98–111)
Creatinine, Ser: 0.64 mg/dL (ref 0.44–1.00)
GFR calc Af Amer: >60 mL/min (ref >60)
GFR calc non Af Amer: >60 mL/min (ref >60)
Glucose, Bld: 121 mg/dL — ABNORMAL HIGH (ref 70–99)
Potassium: 3.8 mmol/L (ref 3.5–5.1)
Sodium: 138 mmol/L (ref 135–145)
Total Bilirubin: 9.3 mg/dL — ABNORMAL HIGH (ref 0.3–1.2)
Total Protein: 6.2 g/dL — ABNORMAL LOW (ref 6.5–8.1)

## 2020-01-08 MED ORDER — ZINC SULFATE 220 (50 ZN) MG PO CAPS: 220.0000 mg | ORAL_CAPSULE | Freq: Every day | ORAL | Status: DC

## 2020-01-08 MED ORDER — LACTULOSE 10 GM/15ML PO SOLN: 20.0000 g | Freq: Three times a day (TID) | ORAL | 0 refills | Status: DC

## 2020-01-08 MED ORDER — TRAZODONE HCL 50 MG PO TABS
25.0000 mg | ORAL_TABLET | Freq: Every day | ORAL | Status: DC
  Administered 2020-01-08 – 2020-01-15 (×8): 25 mg via ORAL
  Filled 2020-01-08 (×8): qty 1

## 2020-01-08 MED ORDER — RIFAXIMIN 550 MG PO TABS: 550.0000 mg | ORAL_TABLET | Freq: Two times a day (BID) | ORAL | Status: DC

## 2020-01-08 MED ORDER — ZINC SULFATE 220 (50 ZN) MG PO CAPS
220.0000 mg | ORAL_CAPSULE | Freq: Every day | ORAL | Status: DC
  Administered 2020-01-09 – 2020-01-15 (×7): 220 mg via ORAL
  Filled 2020-01-08 (×8): qty 1

## 2020-01-08 MED ORDER — MIDODRINE HCL 5 MG PO TABS: 5.0000 mg | ORAL_TABLET | Freq: Three times a day (TID) | ORAL | Status: DC

## 2020-01-08 MED ORDER — PREDNISOLONE 5 MG PO TABS: 40.0000 mg | ORAL_TABLET | Freq: Every day | ORAL | Status: DC

## 2020-01-08 MED ORDER — RIFAXIMIN 550 MG PO TABS
550.0000 mg | ORAL_TABLET | Freq: Two times a day (BID) | ORAL | Status: DC
  Administered 2020-01-09 – 2020-01-15 (×14): 550 mg via ORAL
  Filled 2020-01-08 (×16): qty 1

## 2020-01-08 MED ORDER — ONDANSETRON HCL 4 MG PO TABS: 4.0000 mg | ORAL_TABLET | Freq: Four times a day (QID) | ORAL | Status: DC | PRN

## 2020-01-08 MED ORDER — PREDNISOLONE 5 MG PO TABS: 40.0000 mg | ORAL_TABLET | Freq: Every day | ORAL | 0 refills | Status: DC

## 2020-01-08 MED ORDER — LACTULOSE 10 GM/15ML PO SOLN
20.0000 g | Freq: Three times a day (TID) | ORAL | Status: DC
  Administered 2020-01-08: 20 g via ORAL
  Filled 2020-01-08: qty 30

## 2020-01-08 MED ORDER — ALBUTEROL SULFATE (2.5 MG/3ML) 0.083% IN NEBU: 2.5000 mg | INHALATION_SOLUTION | RESPIRATORY_TRACT | Status: DC | PRN

## 2020-01-08 MED ORDER — FOLIC ACID 1 MG PO TABS: 1.0000 mg | ORAL_TABLET | Freq: Every day | ORAL | Status: DC

## 2020-01-08 MED ORDER — MIDODRINE HCL 5 MG PO TABS
5.0000 mg | ORAL_TABLET | Freq: Three times a day (TID) | ORAL | Status: DC
  Administered 2020-01-08: 5 mg via ORAL
  Filled 2020-01-08: qty 1

## 2020-01-08 MED ORDER — PANTOPRAZOLE SODIUM 40 MG PO TBEC
40.0000 mg | DELAYED_RELEASE_TABLET | Freq: Every day | ORAL | Status: DC
  Administered 2020-01-08: 40 mg via ORAL
  Filled 2020-01-08: qty 1

## 2020-01-08 MED ORDER — PANTOPRAZOLE SODIUM 40 MG PO TBEC: 40.0000 mg | DELAYED_RELEASE_TABLET | Freq: Every day | ORAL | Status: DC

## 2020-01-08 MED ORDER — THIAMINE HCL 100 MG PO TABS: 100.0000 mg | ORAL_TABLET | Freq: Every day | ORAL | Status: DC

## 2020-01-08 MED ORDER — ADULT MULTIVITAMIN W/MINERALS CH
1.0000 | ORAL_TABLET | Freq: Every day | ORAL | Status: DC
  Administered 2020-01-09 – 2020-01-15 (×7): 1 via ORAL
  Filled 2020-01-08 (×8): qty 1

## 2020-01-08 MED ORDER — ENSURE ENLIVE PO LIQD: 237.0000 mL | Freq: Two times a day (BID) | ORAL | 12 refills | Status: DC

## 2020-01-08 MED ORDER — MIDODRINE HCL 5 MG PO TABS
5.0000 mg | ORAL_TABLET | Freq: Three times a day (TID) | ORAL | Status: DC
  Administered 2020-01-08 – 2020-01-15 (×22): 5 mg via ORAL
  Filled 2020-01-08 (×19): qty 1

## 2020-01-08 MED ORDER — FOLIC ACID 1 MG PO TABS
1.0000 mg | ORAL_TABLET | Freq: Every day | ORAL | Status: DC
  Administered 2020-01-09 – 2020-01-15 (×7): 1 mg via ORAL
  Filled 2020-01-08 (×8): qty 1

## 2020-01-08 MED ORDER — PANTOPRAZOLE SODIUM 40 MG PO TBEC
40.0000 mg | DELAYED_RELEASE_TABLET | Freq: Every day | ORAL | Status: DC
  Administered 2020-01-09 – 2020-01-15 (×7): 40 mg via ORAL
  Filled 2020-01-08 (×8): qty 1

## 2020-01-08 MED ORDER — THIAMINE HCL 100 MG PO TABS
100.0000 mg | ORAL_TABLET | Freq: Every day | ORAL | Status: DC
  Administered 2020-01-09 – 2020-01-15 (×7): 100 mg via ORAL
  Filled 2020-01-08 (×8): qty 1

## 2020-01-08 MED ORDER — PREDNISOLONE 5 MG PO TABS
40.0000 mg | ORAL_TABLET | Freq: Every day | ORAL | Status: DC
  Administered 2020-01-09 – 2020-01-15 (×7): 40 mg via ORAL
  Filled 2020-01-08 (×8): qty 8

## 2020-01-08 MED ORDER — LACTULOSE 10 GM/15ML PO SOLN
20.0000 g | Freq: Three times a day (TID) | ORAL | Status: DC
  Administered 2020-01-09 – 2020-01-16 (×21): 20 g via ORAL
  Filled 2020-01-08 (×23): qty 30

## 2020-01-08 MED ORDER — ENSURE ENLIVE PO LIQD
237.0000 mL | Freq: Two times a day (BID) | ORAL | Status: DC
  Administered 2020-01-09 – 2020-01-15 (×10): 237 mL via ORAL

## 2020-01-08 MED ORDER — ONDANSETRON HCL 4 MG/2ML IJ SOLN: 4.0000 mg | Freq: Four times a day (QID) | INTRAMUSCULAR | Status: DC | PRN

## 2020-01-08 NOTE — Progress Notes (Signed)
Patient presented to 4W via bed accompanied by two NT. Patient orientated to room and floor but had difficulty understanding due to altered mental status. Patient was accompanied by daughter and daughter understood orientation.  Sanda Linger, LPN

## 2020-01-08 NOTE — Progress Notes (Signed)
Inpatient Rehabilitation Admissions Coordinator  CIR bed is available today . I met with patient with her daughter at bedside and they are in agreement to admit. I have notified acute team and will make the arrangements to admit today.  Danne Baxter, RN, MSN Rehab Admissions Coordinator [phone] 01/08/2020 11:51 AM

## 2020-01-08 NOTE — H&P (Signed)
Physical Medicine and Rehabilitation Admission H&P    Chief Complaint  Patient presents with  . Psychiatric Evaluation  . Alcohol Problem  : HPI: Joann [name] is a 39 year old right-handed female with history of hypertension, alcohol dependence with history of alcoholic pancreatitis seen by [facility]ology in the past.  She reportedly has failed rehab multiple times. History taken from chart review and daughter due to cognition. Patient lives with spouse and children.  Independent prior to admission.  Two-level home bed and bath on main level. She presented on 12/23/2019 with AMS. Cranial CT unremarkable for acute intracranial process. Ultrasound the abdomen showed hepatic steatosis with subtle nodularity of the liver capsule reflecting cirrhosis.  Minimal abdominal ascites most prominent in the right upper quadrant.  Admission chemistry sodium 128, potassium 2.3, creatinine 1.07, chloride 89, total bilirubin 26.6, AST 143, ammonia 137, alkaline phos 197.  Alcohol level of 13, hemoglobin 9.9, platelets 137,000.  She did require initial intubation for airway protection.  Gastroenterology services consulted for anemia positive guaiac stool and underwent GI endoscopy 12/27/2019 which was unremarkable for bleed. Hospital course further complicated by thrombocytopenia, requiring 1 unit of fresh frozen plasma for coagulopathy thrombocytopenia/anemia with latest platelets 53,000 and hemoglobin 7.0 secondary to alcohol hepatitis and chronic liver disease.  Leukocytosis 16,900 felt to recent steroid-induced.  Maintained on Xifixan for her chronic liver disease as well as course of prednisolone x28 days for her acute alcoholic hepatitis.  Latest chemistries much improved sodium 138, potassium 3.8 ammonia level currently 56 she remains on Chronulac.  Bouts of orthostasis currently maintained on ProAmatine.  Palliative care was consulted to establish goals of care.  Tolerating a regular diet.  Therapy evaluations  completed and patient was admitted for a comprehensive rehab program. Please see preadmission assessment from earlier today as well.  Review of Systems  Unable to perform ROS: Mental acuity   Past Medical History:  Diagnosis Date  . Alcohol abuse   . Anxiety   . Hypertension   . Liver disease    Past Surgical History:  Procedure Laterality Date  . ECTOPIC PREGNANCY SURGERY    . ESOPHAGOGASTRODUODENOSCOPY (EGD) WITH PROPOFOL N/A 12/27/2019   Procedure: ESOPHAGOGASTRODUODENOSCOPY (EGD) WITH PROPOFOL;  Surgeon: Carol Ada, MD;  Location: [place];  Service: Endoscopy;  Laterality: N/A;   Family History  Problem Relation Age of Onset  . Healthy Mother   . Alcohol abuse Mother        quit in her 9s  . Alcohol abuse Father   . Diabetes Father   . Healthy Brother   . Hypertension Maternal Grandmother   . Hypertension Maternal Grandfather   . Healthy Brother   . Healthy Brother    Social History:  reports that she has never smoked. She has never used smokeless tobacco. She reports current alcohol use. She reports that she does not use drugs. Allergies: No Known Allergies Medications Prior to Admission  Medication Sig Dispense Refill  . hydrOXYzine (ATARAX/VISTARIL) 25 MG tablet Take 25 mg by mouth every 6 (six) hours as needed for anxiety.    . Multiple Vitamins-Minerals (MULTIVITAMIN WITH MINERALS) tablet Take 1 tablet by mouth daily.    Marland Kitchen topiramate (TOPAMAX) 200 MG tablet Take 200 mg by mouth 2 (two) times daily.    Marland Kitchen disulfiram (ANTABUSE) 250 MG tablet Take 250 mg by mouth daily.    . folic acid (FOLVITE) 1 MG tablet Take 1 mg by mouth daily.    . Multiple Vitamin (MULTIVITAMIN WITH  MINERALS) TABS tablet Take 1 tablet by mouth 2 (two) times a week.     . traZODone (DESYREL) 50 MG tablet Take 50 mg by mouth at bedtime.    Marland Kitchen venlafaxine (EFFEXOR) 75 MG tablet Take 75 mg by mouth daily.      Drug Regimen Review Drug regimen was reviewed and remains appropriate with no  significant issues identified  Home: Home Living Family/patient expects to be discharged to:: Private residence Living Arrangements: Spouse/significant other, Children Available Help at Discharge: Family, Available 24 hours/day Type of Home: House Home Access: Level entry Home Layout: Two level, Able to live on main level with bedroom/bathroom Bathroom Shower/Tub: Multimedia programmer: Standard Bathroom Accessibility: Yes Home Equipment: Environmental consultant - 2 wheels, Cane - single point, Bedside commode, Shower seat - built in(unsure of accuracy)   Functional History: Prior Function Level of Independence: Independent  Functional Status:  Mobility: Bed Mobility Overal bed mobility: Needs Assistance Bed Mobility: Supine to Sit Supine to sit: Min assist General bed mobility comments: pt able to get into sitting with use of bed rails; assist needed to scoot hips to EOB; cues to stay on task  Transfers Overall transfer level: Needs assistance Equipment used: Rolling walker (2 wheeled) Transfers: Sit to/from Stand Sit to Stand: Mod assist Stand pivot transfers: Mod assist, +2 physical assistance General transfer comment: cues for hand placement; assistance required to power up into standing and gain balanace upon stand due to poserior bias initially Ambulation/Gait Ambulation/Gait assistance: Min assist, Mod assist, +2 safety/equipment Gait Distance (Feet): 100 Feet Assistive device: Rolling walker (2 wheeled) Gait Pattern/deviations: Step-through pattern, Decreased step length - right, Decreased step length - left, Staggering left, Drifts right/left, Narrow base of support General Gait Details: cues for use of RW, maintaining safe proximity to RW, and increased bilat step lengths; assistance required for balance and managing AD; pt with multiple L lateral LOB  Gait velocity: decreased    ADL: ADL Overall ADL's : Needs assistance/impaired Eating/Feeding: Minimal  assistance Grooming: Moderate assistance Upper Body Bathing: Moderate assistance, Sitting Lower Body Bathing: Moderate assistance, Sit to/from stand Upper Body Dressing : Moderate assistance, Sitting Lower Body Dressing: Maximal assistance, Sit to/from stand Toilet Transfer: Moderate assistance, +2 for physical assistance, Stand-pivot, Cueing for safety, Cueing for sequencing Toileting- Clothing Manipulation and Hygiene: Total assistance Toileting - Clothing Manipulation Details (indicate cue type and reason): rectal foley; total assist for pericare adn to manage clothing Functional mobility during ADLs: Moderate assistance, +2 for physical assistance General ADL Comments: Poor awareness of safety during ADL tasks; Continues to say "I can do it, I don't need help"  Cognition: Cognition Overall Cognitive Status: Impaired/Different from baseline Orientation Level: Oriented X4 Cognition Arousal/Alertness: Awake/alert Behavior During Therapy: Flat affect Overall Cognitive Status: Impaired/Different from baseline Area of Impairment: Attention, Safety/judgement, Problem solving Current Attention Level: Sustained Safety/Judgement: Decreased awareness of safety, Decreased awareness of deficits Awareness: Emergent Problem Solving: Slow processing, Difficulty sequencing General Comments: pt fixated on her mother to pray; pt easily distracted and needs cues to attend to mobility tasks  Physical Exam: Blood pressure 108/69, pulse 86, temperature 98.5 F (36.9 C), temperature source Oral, resp. rate 18, height 5' 4"  (1.626 m), weight 75 kg, SpO2 98 %. Physical Exam  Vitals reviewed. Constitutional: She appears well-developed and well-nourished.  HENT:  Head: Normocephalic and atraumatic.  Eyes: EOM are normal. Right eye exhibits no discharge. Left eye exhibits no discharge. Scleral icterus is present.  Neck: No tracheal deviation present. No thyromegaly present.  Respiratory: Effort normal.  No stridor. No respiratory distress.  Musculoskeletal:     Comments: No edema or tenderness in extremities  Neurological: She is alert.  Makes good eye contact with examiner.   Some delay in processing for appropriate place.   She does have some decreased awareness of her deficits. Motor: Grossly 4+/5 throughout  Skin: Skin is warm and dry.  Psychiatric: Her speech is tangential. She is hyperactive. Cognition and memory are impaired. She expresses impulsivity.  Perseverative and confused.    Results for orders placed or performed during the hospital encounter of 12/23/19 (from the past 48 hour(s))  Comprehensive metabolic panel     Status: Abnormal   Collection Time: 01/06/20  7:17 AM  Result Value Ref Range   Sodium 138 135 - 145 mmol/L   Potassium 3.6 3.5 - 5.1 mmol/L   Chloride 106 98 - 111 mmol/L   CO2 23 22 - 32 mmol/L   Glucose, Bld 107 (H) 70 - 99 mg/dL    Comment: Glucose reference range applies only to samples taken after fasting for at least 8 hours.   BUN 9 6 - 20 mg/dL   Creatinine, Ser 0.51 0.44 - 1.00 mg/dL   Calcium 8.5 (L) 8.9 - 10.3 mg/dL   Total Protein 5.9 (L) 6.5 - 8.1 g/dL   Albumin 2.0 (L) 3.5 - 5.0 g/dL   AST 181 (H) 15 - 41 U/L   ALT 56 (H) 0 - 44 U/L   Alkaline Phosphatase 158 (H) 38 - 126 U/L   Total Bilirubin 10.4 (H) 0.3 - 1.2 mg/dL   GFR calc non Af Amer >60 >60 mL/min   GFR calc Af Amer >60 >60 mL/min   Anion gap 9 5 - 15    Comment: Performed at [facility] [address] [place], [place] [address]  Glucose, capillary     Status: Abnormal   Collection Time: 01/06/20  7:19 AM  Result Value Ref Range   Glucose-Capillary 100 (H) 70 - 99 mg/dL    Comment: Glucose reference range applies only to samples taken after fasting for at least 8 hours.  Glucose, capillary     Status: Abnormal   Collection Time: 01/06/20 11:56 AM  Result Value Ref Range   Glucose-Capillary 111 (H) 70 - 99 mg/dL    Comment: Glucose reference range applies only to  samples taken after fasting for at least 8 hours.  Glucose, capillary     Status: Abnormal   Collection Time: 01/06/20  3:26 PM  Result Value Ref Range   Glucose-Capillary 159 (H) 70 - 99 mg/dL    Comment: Glucose reference range applies only to samples taken after fasting for at least 8 hours.  Glucose, capillary     Status: Abnormal   Collection Time: 01/06/20  9:14 PM  Result Value Ref Range   Glucose-Capillary 133 (H) 70 - 99 mg/dL    Comment: Glucose reference range applies only to samples taken after fasting for at least 8 hours.   Comment 1 Notify RN    Comment 2 Document in Chart   Glucose, capillary     Status: Abnormal   Collection Time: 01/06/20 11:31 PM  Result Value Ref Range   Glucose-Capillary 162 (H) 70 - 99 mg/dL    Comment: Glucose reference range applies only to samples taken after fasting for at least 8 hours.   Comment 1 Notify RN    Comment 2 Document in Chart   Glucose, capillary  Status: None   Collection Time: 01/07/20  3:22 AM  Result Value Ref Range   Glucose-Capillary 84 70 - 99 mg/dL    Comment: Glucose reference range applies only to samples taken after fasting for at least 8 hours.   Comment 1 Notify RN    Comment 2 Document in Chart   CBC     Status: Abnormal   Collection Time: 01/07/20  5:34 AM  Result Value Ref Range   WBC 18.6 (H) 4.0 - 10.5 K/uL   RBC 2.18 (L) 3.87 - 5.11 MIL/uL   Hemoglobin 7.7 (L) 12.0 - 15.0 g/dL   HCT 25.4 (L) 36.0 - 46.0 %   MCV 116.5 (H) 80.0 - 100.0 fL   MCH 35.3 (H) 26.0 - 34.0 pg   MCHC 30.3 30.0 - 36.0 g/dL   RDW Not Measured 11.5 - 15.5 %   Platelets 51 (L) 150 - 400 K/uL    Comment: Immature Platelet Fraction may be clinically indicated, consider ordering this additional test [id number] REPEATED TO VERIFY    nRBC 0.0 0.0 - 0.2 %    Comment: Performed at [facility], [place] [address], [place], [place] [address]  Comprehensive metabolic panel     Status: Abnormal   Collection Time: 01/07/20  5:34 AM   Result Value Ref Range   Sodium 139 135 - 145 mmol/L   Potassium 3.6 3.5 - 5.1 mmol/L   Chloride 109 98 - 111 mmol/L   CO2 23 22 - 32 mmol/L   Glucose, Bld 96 70 - 99 mg/dL    Comment: Glucose reference range applies only to samples taken after fasting for at least 8 hours.   BUN 8 6 - 20 mg/dL   Creatinine, Ser 0.56 0.44 - 1.00 mg/dL   Calcium 8.6 (L) 8.9 - 10.3 mg/dL   Total Protein 6.5 6.5 - 8.1 g/dL   Albumin 2.0 (L) 3.5 - 5.0 g/dL   AST 169 (H) 15 - 41 U/L   ALT 60 (H) 0 - 44 U/L   Alkaline Phosphatase 164 (H) 38 - 126 U/L   Total Bilirubin 10.4 (H) 0.3 - 1.2 mg/dL   GFR calc non Af Amer >60 >60 mL/min   GFR calc Af Amer >60 >60 mL/min   Anion gap 7 5 - 15    Comment: Performed at [facility] [address], [place], [place] [address]  Glucose, capillary     Status: None   Collection Time: 01/07/20  8:34 AM  Result Value Ref Range   Glucose-Capillary 85 70 - 99 mg/dL    Comment: Glucose reference range applies only to samples taken after fasting for at least 8 hours.  Glucose, capillary     Status: Abnormal   Collection Time: 01/07/20 11:44 AM  Result Value Ref Range   Glucose-Capillary 110 (H) 70 - 99 mg/dL    Comment: Glucose reference range applies only to samples taken after fasting for at least 8 hours.  Glucose, capillary     Status: Abnormal   Collection Time: 01/07/20  3:53 PM  Result Value Ref Range   Glucose-Capillary 139 (H) 70 - 99 mg/dL    Comment: Glucose reference range applies only to samples taken after fasting for at least 8 hours.  Glucose, capillary     Status: Abnormal   Collection Time: 01/07/20  8:33 PM  Result Value Ref Range   Glucose-Capillary 157 (H) 70 - 99 mg/dL    Comment: Glucose reference range  applies only to samples taken after fasting for at least 8 hours.  Glucose, capillary     Status: Abnormal   Collection Time: 01/07/20 11:59 PM  Result Value Ref Range   Glucose-Capillary 105 (H) 70 - 99 mg/dL    Comment: Glucose  reference range applies only to samples taken after fasting for at least 8 hours.   Comment 1 Notify RN    Comment 2 Document in Chart   Glucose, capillary     Status: Abnormal   Collection Time: 01/08/20  3:11 AM  Result Value Ref Range   Glucose-Capillary 133 (H) 70 - 99 mg/dL    Comment: Glucose reference range applies only to samples taken after fasting for at least 8 hours.   Comment 1 Notify RN    Comment 2 Document in Chart   Comprehensive metabolic panel     Status: Abnormal   Collection Time: 01/08/20  4:26 AM  Result Value Ref Range   Sodium 138 135 - 145 mmol/L   Potassium 3.8 3.5 - 5.1 mmol/L   Chloride 108 98 - 111 mmol/L   CO2 22 22 - 32 mmol/L   Glucose, Bld 121 (H) 70 - 99 mg/dL    Comment: Glucose reference range applies only to samples taken after fasting for at least 8 hours.   BUN 6 6 - 20 mg/dL   Creatinine, Ser 0.64 0.44 - 1.00 mg/dL   Calcium 8.6 (L) 8.9 - 10.3 mg/dL   Total Protein 6.2 (L) 6.5 - 8.1 g/dL   Albumin 2.1 (L) 3.5 - 5.0 g/dL   AST 131 (H) 15 - 41 U/L   ALT 55 (H) 0 - 44 U/L   Alkaline Phosphatase 157 (H) 38 - 126 U/L   Total Bilirubin 9.3 (H) 0.3 - 1.2 mg/dL   GFR calc non Af Amer >60 >60 mL/min   GFR calc Af Amer >60 >60 mL/min   Anion gap 8 5 - 15    Comment: Performed at [facility] [address] [place], [place] [address]  Ammonia     Status: Abnormal   Collection Time: 01/08/20  4:27 AM  Result Value Ref Range   Ammonia 56 (H) 9 - 35 umol/L    Comment: Performed at [facility], [place] [address], [place], [place] [address]   No results found.     Medical Problem List and Plan: 1.  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  Continue lactulose as directed as well as prednisolone x28 days total per GI services as well as Xifaxan for chronic liver disease  -patient may shower  -ELOS/Goals: 7-12 days/supervision/min a  Admit to CIR 2.   Antithrombotics: -DVT/anticoagulation: SCDs  -antiplatelet therapy: N/A 3. Pain Management: Tylenol 4. Mood: Trazodone 25 mg nightly  -antipsychotic agents: N/A 5. Neuropsych: This patient is not capable of making decisions on her own behalf. 6. Skin/Wound Care: Routine skin checks 7. Fluids/Electrolytes/Nutrition: Routine in and outs. CMP ordered. 8.  Coagulopathy/thrombocytopenia/chronic macrocytic anemia/leukocytosis.  Leukocytosis felt to be related to prednisolone.  Follow-up chemistries.  Transfuse if symptomatic  CBC ordered. 9.  Orthostasis.  ProAmatine 3 times daily.  Monitor with increased mobility. 10.  Decreased nutritional storage.  Follow-up dietary services  Supplementation  Cathlyn Parsons, PA-C 01/08/2020  I have personally performed a face to face diagnostic evaluation, including, but not limited to relevant history and physical exam findings, of this patient and developed relevant  assessment and plan.  Additionally, I have reviewed and concur with the physician assistant's documentation above.  Delice Lesch, MD, ABPMR

## 2020-01-08 NOTE — Progress Notes (Signed)
Occupational Therapy Treatment Patient Details Name: Joann [name] MRN: [id number] DOB: 1981/05/27 Today's Date: 01/08/2020    History of present illness 39 yoF with ETOH abuse and alcoholic hepatitis presenting with worsening mental status found to have acute hepatic encephalopathy, anemia, AKI, and coagulapathy.  GI following.  Mental status has waxed/ waned however developed bleeding from suspected nares 4/22 and with ongoing low GCS, PCCM consulted over concerns of airway protection. Patient was intubated and transferred to ICU. Extubated 4/27.    OT comments  Patient continues to make steady progress towards goals in skilled OT session. Patient's session encompassed co-treat with PT to address functional deficits and complete trail mapping task, and basic ADLs. Pt with increased orientation to date, however is currently unable to follow 2 step commands and requires increased cues and time in order to follow one step commands appropriately. Pt with need for increased multi-modal cues (mod level) to heed to environment, often deviating through hallway and in room with walker almost pinching her fingers or walking into objects. Pt would continue to benefit greatly from CIR placement in order to address functional deficits; will continue to follow acutely.    Follow Up Recommendations  CIR;Supervision/Assistance - 24 hour    Equipment Recommendations  3 in 1 bedside commode    Recommendations for Other Services      Precautions / Restrictions Precautions Precautions: Fall Precaution Comments: flexiseal Restrictions Weight Bearing Restrictions: No       Mobility Bed Mobility Overal bed mobility: Needs Assistance Bed Mobility: Supine to Sit     Supine to sit: Min guard     General bed mobility comments: min guard for safety; cues for sequencing; increased time and effort  Transfers Overall transfer level: Needs assistance Equipment used: Rolling walker (2 wheeled) Transfers:  Sit to/from Stand Sit to Stand: Mod assist         General transfer comment: cues for hand placement; assistance required to power up into standing and gain balanace upon stand    Balance Overall balance assessment: Needs assistance Sitting-balance support: Feet supported Sitting balance-Leahy Scale: Good     Standing balance support: Bilateral upper extremity supported;During functional activity Standing balance-Leahy Scale: Poor Standing balance comment: reliant on external support                           ADL either performed or assessed with clinical judgement   ADL Overall ADL's : Needs assistance/impaired     Grooming: Wash/dry hands;Minimal assistance;Cueing for sequencing Grooming Details (indicate cue type and reason): Pt with perseverative tendencies, washing hands twice and requiring cues to find soap and dry off with towel. After drying hands with towel, pt attempted to reach for paper towels to dry hands again                 Toilet Transfer: Moderate assistance;Cueing for safety;Cueing for sequencing;Ambulation;+2 for physical assistance Toilet Transfer Details (indicate cue type and reason): Plus two transfer for safety when ambulating, simulated with transfer to recliner after functional ambulation Toileting- Clothing Manipulation and Hygiene: Total assistance Toileting - Clothing Manipulation Details (indicate cue type and reason): rectal foley; total assist for pericare adn to manage clothing     Functional mobility during ADLs: +2 for physical assistance;Minimal assistance General ADL Comments: pt with increase in participation to date, however continues to rely on therapist's for appropriate cues to sequence     Vision       Perception  Praxis      Cognition Arousal/Alertness: Awake/alert Behavior During Therapy: WFL for tasks assessed/performed Overall Cognitive Status: Impaired/Different from baseline Area of Impairment:  Attention;Safety/judgement;Problem solving;Following commands;Awareness                   Current Attention Level: Sustained   Following Commands: Follows one step commands with increased time;Follows multi-step commands inconsistently Safety/Judgement: Decreased awareness of safety;Decreased awareness of deficits Awareness: Emergent Problem Solving: Slow processing;Difficulty sequencing;Requires verbal cues;Decreased initiation General Comments: pt is completely oriented, but requires increased time to follow one step commands, and is currently unable to follow 2 step commands        Exercises     Shoulder Instructions       General Comments daughter present     Pertinent Vitals/ Pain       Pain Assessment: No/denies pain  Home Living   Living Arrangements: (spouse and 45 year old son, [name])                 Bathroom Shower/Tub: Walk-in shower(and tub)                Lives With: Spouse;Son    Prior Functioning/Environment              Frequency  Min 2X/week        Progress Toward Goals  OT Goals(current goals can now be found in the care plan section)  Progress towards OT goals: Progressing toward goals  Acute Rehab OT Goals Patient Stated Goal: to go to rehab OT Goal Formulation: With patient/family Time For Goal Achievement: 01/20/20 Potential to Achieve Goals: Good  Plan Discharge plan remains appropriate    Co-evaluation      Reason for Co-Treatment: For patient/therapist safety;To address functional/ADL transfers;Necessary to address cognition/behavior during functional activity          AM-PAC OT "6 Clicks" Daily Activity     Outcome Measure   Help from another person eating meals?: A Little Help from another person taking care of personal grooming?: A Lot Help from another person toileting, which includes using toliet, bedpan, or urinal?: Total Help from another person bathing (including washing, rinsing,  drying)?: A Lot Help from another person to put on and taking off regular upper body clothing?: A Lot Help from another person to put on and taking off regular lower body clothing?: A Lot 6 Click Score: 12    End of Session Equipment Utilized During Treatment: Gait belt;Rolling walker  OT Visit Diagnosis: Unsteadiness on feet (R26.81);Other abnormalities of gait and mobility (R26.89);Muscle weakness (generalized) (M62.81);Other symptoms and signs involving cognitive function;Pain   Activity Tolerance Patient tolerated treatment well   Patient Left in chair;with call bell/phone within reach;with chair alarm set;with family/visitor present   Nurse Communication Mobility status        Time: 3846-6599 OT Time Calculation (min): 33 min  Charges: OT General Charges $OT Visit: 1 Visit OT Treatments $Self Care/Home Management : 8-22 mins  Corinne Ports E. Bow Buntyn, COTA/L Acute Rehabilitation Services [place] 01/08/2020, 1:52 PM

## 2020-01-08 NOTE — Discharge Summary (Signed)
Physician Discharge Summary  Joann [name] ZOX:[id number] DOB: 1981/05/13 DOA: 12/23/2019  PCP: Nelwyn [place], PA-C  Admit date: 12/23/2019 Discharge date: 01/08/2020  Admitted From: Home Disposition: Acute inpatient rehab  Recommendations for Outpatient Follow-up:  1. Follow up with PCP in 1-2 weeks after discharge 2. Please obtain LFT/ Magnesium/ phos/CBC in 2 days  3. Refer to substance abuse/alcohol abuse rehab resources when discharged from physical therapy rehab.  Discharge Condition: Stable CODE STATUS: Partial code Diet recommendation: Regular diet, nutrition supplements  Discharge summary: 39 year old female with alcohol abuse and alcoholic hepatitis who presented with worsening mental status.  She was on the medical floor and treated for acute hepatic encephalopathy, acute alcoholic hepatitis, AKI and coagulopathy.  GI was following.  Mental status has been waxing and waning.  On 4/22, became more lethargic and was intubated and transferred to ICU to protect her airway. 4/14 admitted to [facility] 4/18 upper GI endoscopy, no bleeding lesion found.  Remains on the steroids. 4/22 intubated and admitted to ICU 4/27 extubated to room air 4/28 transfer to medical floor.  # Hepatic encephalopathy/acute alcoholic hepatitis with alcohol dependence with withdrawal and chronic liver disease: Some clinical improvement on mentation.  Rocephin, finished 5 days therapy.  Remains on rifaximin, will continue. Prednisone 40 mg daily, day 15/28.  Continue 40 mg for total of 4 weeks, then gradually taper off next 4 weeks. Lactulose with aggressive bowel regimen to decrease ammonia levels. Close monitoring of LFTs. Going for physical therapy rehab.  Patient will need referral to substance abuse/alcohol rehab when discharged from acute inpatient rehab.  # Respiratory failure in the setting of presumed aspiration: Improved.  On room air now.  # Leukocytosis: On prednisone.  No evidence of bacterial  infection.  Finished 5 days of Rocephin therapy.  We will continue to monitor.  # Anemia/ Coagulopathy/thrombocytopenia secondary to alcohol hepatitis and chronic liver disease: Acute bleeding from right naris, stabilized.  Platelets more than 50,000.  Was given 1 unit of platelets.  We will continue to monitor closely. Hemoglobin is 7.  No active bleeding.  Will not transfuse until symptomatic or less than 6. Recheck her liver function tests, magnesium phosphorus as well as hemoglobin and platelets in the next 2 to 3 days.  #Alcoholism: Outside alcohol withdrawal window.  Symptomatic treatment with multivitamins.  Will resume her SSRI.  Trazodone at night.  Continue counseling.  Complex medical problems and need for inpatient therapies.  She is appropriate to transfer to inpatient rehab level of care today.   Discharge Diagnoses:  Principal Problem:   Hepatic encephalopathy ([place]) Active Problems:   Alcohol dependence with withdrawal, unspecified (HCC)   Thrombocytopenia (HCC)   Hypokalemia due to inadequate potassium intake   Alcoholic hepatitis without ascites   Ascites   Advanced care planning/counseling discussion   Goals of care, counseling/discussion   Palliative care by specialist   Hypoalbuminemia   Acute respiratory failure ([place])   Palliative care encounter   Elevated bilirubin   Alcoholic hepatitis with ascites   Pressure injury of skin   AMS (altered mental status)   Sleep disturbance    Discharge Instructions  Discharge Instructions    Diet - low sodium heart healthy   Complete by: As directed    Increase activity slowly   Complete by: As directed      Allergies as of 01/08/2020   No Known Allergies     Medication List    STOP taking these medications   disulfiram 250 MG tablet  Commonly known as: ANTABUSE   hydrOXYzine 25 MG tablet Commonly known as: ATARAX/VISTARIL   multivitamin with minerals tablet   multivitamin with minerals Tabs tablet    topiramate 200 MG tablet Commonly known as: TOPAMAX     TAKE these medications   feeding supplement (ENSURE ENLIVE) Liqd Take 237 mLs by mouth 2 (two) times daily between meals.   folic acid 1 MG tablet Commonly known as: FOLVITE Take 1 tablet (1 mg total) by mouth daily. Start taking on: Jan 09, 2020   lactulose 10 GM/15ML solution Commonly known as: CHRONULAC Take 30 mLs (20 g total) by mouth every 8 (eight) hours.   midodrine 5 MG tablet Commonly known as: PROAMATINE Take 1 tablet (5 mg total) by mouth 3 (three) times daily with meals.   pantoprazole 40 MG tablet Commonly known as: PROTONIX Take 1 tablet (40 mg total) by mouth daily. Start taking on: Jan 09, 2020   prednisoLONE 5 MG Tabs tablet Take 8 tablets (40 mg total) by mouth daily. Start taking on: Jan 09, 2020   rifaximin 550 MG Tabs tablet Commonly known as: XIFAXAN Take 1 tablet (550 mg total) by mouth 2 (two) times daily.   thiamine 100 MG tablet Take 1 tablet (100 mg total) by mouth daily. Start taking on: Jan 09, 2020   traZODone 50 MG tablet Commonly known as: DESYREL Take 50 mg by mouth at bedtime.   venlafaxine 75 MG tablet Commonly known as: EFFEXOR Take 75 mg by mouth daily.      Follow-up Information    [facility], [place], [place]. Schedule an appointment as soon as possible for a visit.   Specialty: Nurse Practitioner Why: for follow up of liver disease.   Contact information: [address] [place] [address] [phone]          No Known Allergies  Consultations:  Palliative medicine  Gastroenterology  PCCM   Procedures/Studies: DG Chest 1 View  Result Date: 12/25/2019 CLINICAL DATA:  Leukocytosis EXAM: CHEST  1 VIEW COMPARISON:  04/02/2019 FINDINGS: Cardiac shadow is at the upper limits of normal in size. The lungs are hypoinflated with bibasilar atelectasis left greater than right. No pneumothorax or sizable effusion is seen. No bony abnormality is noted. IMPRESSION:  Bibasilar atelectasis as described. Electronically Signed   By: Inez Catalina M.D.   On: 12/25/2019 15:16   CT HEAD WO CONTRAST  Result Date: 12/30/2019 CLINICAL DATA:  39 year old female with altered mental status. EXAM: CT HEAD WITHOUT CONTRAST TECHNIQUE: Contiguous axial images were obtained from the base of the skull through the vertex without intravenous contrast. COMPARISON:  Head CT dated 12/24/2019. FINDINGS: Brain: The ventricles and sulci appropriate size for patient's age. The gray-white matter discrimination is preserved. There is no acute intracranial hemorrhage. No mass effect or midline shift. No extra-axial fluid collection. Vascular: No hyperdense vessel or unexpected calcification. Skull: Normal. Negative for fracture or focal lesion. Sinuses/Orbits: The visualized paranasal sinuses and mastoid air cells are clear. Partially visualized nasal tube. Other: None IMPRESSION: Unremarkable noncontrast CT of the brain. Electronically Signed   By: Anner Crete M.D.   On: 12/30/2019 18:52   CT Head Wo Contrast  Result Date: 12/24/2019 CLINICAL DATA:  39 year old female with history of head trauma. Altered mental status. EXAM: CT HEAD WITHOUT CONTRAST TECHNIQUE: Contiguous axial images were obtained from the base of the skull through the vertex without intravenous contrast. COMPARISON:  Head CT 11/21/2018. FINDINGS: Brain: No evidence of acute infarction, hemorrhage, hydrocephalus, extra-axial  collection or mass lesion/mass effect. Vascular: No hyperdense vessel or unexpected calcification. Skull: Normal. Negative for fracture or focal lesion. Sinuses/Orbits: No acute finding. Other: None. IMPRESSION: 1. No acute intracranial abnormalities. The appearance of the brain is normal. Electronically Signed   By: Vinnie Langton M.D.   On: 12/24/2019 06:33   US Abdomen Limited  Result Date: 12/28/2019 CLINICAL DATA:  Ascites.  History of alcohol abuse. EXAM: LIMITED ABDOMEN ULTRASOUND FOR ASCITES  TECHNIQUE: Limited ultrasound survey for ascites was performed in all four abdominal quadrants. COMPARISON:  Right upper quadrant ultrasound 12/23/2019, pelvic ultrasound 12/26/2019 FINDINGS: All 4 quadrants were scanned for ascites. Small volume ascites in the right upper quadrant, trace free fluid in the other quadrants. IMPRESSION: Minimal abdominal ascites most prominent in the right upper quadrant. Electronically Signed   By: Keith Rake M.D.   On: 12/28/2019 19:47   DG Chest Port 1 View  Result Date: 01/02/2020 CLINICAL DATA:  39 year old female with history of acute respiratory failure. EXAM: PORTABLE CHEST 1 VIEW COMPARISON:  Chest x-ray 12/31/2019. FINDINGS: An endotracheal tube is in place with tip 2.8 cm above the carina. A feeding tube is seen extending into the abdomen, however, the tip of the feeding tube extends below the lower margin of the image. Lung volumes are low. Left basilar opacity may reflect areas of atelectasis and/or consolidation. Small bilateral pleural effusions. No evidence of pulmonary edema. Heart size is normal. Upper mediastinal contours are within normal limits. IMPRESSION: 1. Support apparatus, as above. 2. Low lung volumes with atelectasis and/or consolidation in the left lower lobe. 3. Small bilateral pleural effusions. Electronically Signed   By: Vinnie Langton M.D.   On: 01/02/2020 08:26   DG CHEST PORT 1 VIEW  Result Date: 12/31/2019 CLINICAL DATA:  Intubation. EXAM: PORTABLE CHEST 1 VIEW COMPARISON:  12/30/2019. FINDINGS: Endotracheal tube tip 5 mm above the lower portion of the carina. Proximal repositioning of approximately 2 cm should be considered. Feeding tube in stable position. Heart size normal. Progressive bibasilar atelectasis/infiltrates. Bilateral interstitial prominence again noted suggesting interstitial edema and/or pneumonitis. No pleural effusion or pneumothorax. No acute bony abnormality identified. IMPRESSION: 1. Endotracheal tube tip 5 mm  above the lower portion of the carina. Proximal repositioning of approximately 2 cm should be considered. Feeding tube in stable position. 2. Progressive bibasilar atelectasis/infiltrates. Bilateral interstitial prominence again noted suggesting interstitial edema and/or pneumonitis. These results will be called to the ordering clinician or representative by the Radiologist Assistant, and communication documented in the PACS or [employer]. Electronically Signed   By: Marcello Moores  Register   On: 12/31/2019 06:31   DG Chest Port 1V same Day  Result Date: 12/30/2019 CLINICAL DATA:  39 year old female with altered mental status. EXAM: PORTABLE CHEST 1 VIEW COMPARISON:  Chest radiograph dated 12/25/2019. FINDINGS: Feeding tube with tip to the right of the spine in the distal stomach. Overall improved aeration of the lungs compared to the prior radiograph. Mild interstitial nodularity. No focal consolidation, pleural effusion, pneumothorax. Six 60 stable cardiac silhouette. No acute osseous pathology. IMPRESSION: 1. No focal consolidation. 2. Feeding tube in the distal stomach. Electronically Signed   By: Anner Crete M.D.   On: 12/30/2019 18:30   EEG adult  Result Date: 12/31/2019 Lora Havens, MD     12/31/2019  9:47 AM Patient Name: SEJAL COFIELD MRN: [id number] Epilepsy Attending: Lora Havens Referring Physician/Provider: Dr Oren Binet Date: 12/30/2019 Duration: 22.25 mins Patient history: 39 yo F with h/o etoh dependence  and seizures admitted for ams. EEG to evaluate for seizure.  Level of alertness: lethargic AEDs during EEG study: None Technical aspects: This EEG study was done with scalp electrodes positioned according to the 10-20 International system of electrode placement. Electrical activity was acquired at a sampling rate of 500Hz  and reviewed with a high frequency filter of 70Hz  and a low frequency filter of 1Hz . EEG data were recorded continuously and digitally stored. DESCRIPTION:  EEG showed continuous generalized low amplitude 2-3hz  delta slowing. Hyperventilation and photic stimulation were not performed. EEG was technically difficult due to significant myogenic artifact. ABNORMALITY - Continuous slow, generalized IMPRESSION: This technically difficult study is suggestive of severe diffuse encephalopathy, non specific to etiology. No seizures or epileptiform discharges were seen throughout the recording. Priyanka Barbra Sarks   US PELVIC COMPLETE WITH TRANSVAGINAL  Result Date: 12/26/2019 CLINICAL DATA:  Menorrhagia.  Low hemoglobin. EXAM: TRANSABDOMINAL AND TRANSVAGINAL ULTRASOUND OF PELVIS TECHNIQUE: Both transabdominal and transvaginal ultrasound examinations of the pelvis were performed. Transabdominal technique was performed for global imaging of the pelvis including uterus, ovaries, adnexal regions, and pelvic cul-de-sac. It was necessary to proceed with endovaginal exam following the transabdominal exam to visualize the ovaries and better visualize the uterus and endometrium. COMPARISON:  None FINDINGS: Uterus Measurements: 7.2 x 5.0 x 3.5 cm = volume: 65 mL. No fibroids or other mass visualized. Endometrium Thickness: 7 mm.  Poorly visualized.  No focal abnormality seen. Right ovary Not visualized. Left ovary Not visualized. Other findings Small amount of free peritoneal fluid. IMPRESSION: 1. Normal appearing uterus with the exception of poor visualization of the endometrium. 2. Small amount of free peritoneal fluid. 3. Nonvisualized ovaries. Electronically Signed   By: Claudie Revering M.D.   On: 12/26/2019 19:24   US Abdomen Limited RUQ  Result Date: 12/23/2019 CLINICAL DATA:  Elevated liver function tests EXAM: ULTRASOUND ABDOMEN LIMITED RIGHT UPPER QUADRANT COMPARISON:  07/06/2019, 08/29/2018 FINDINGS: Gallbladder: Surgically absent Common bile duct: Diameter: 6 mm Liver: Heterogeneous increased liver echotexture consistent with hepatic steatosis. Slight nodularity of the liver  capsule may reflect underlying cirrhosis. No focal liver abnormalities. Portal vein not well visualized due to patient cooperation. Other: None. IMPRESSION: 1. Cholecystectomy. 2. Hepatic steatosis, with subtle nodularity of the liver capsule which may reflect developing cirrhosis. Electronically Signed   By: Randa Ngo M.D.   On: 12/23/2019 23:29    Subjective: Patient seen and examined.  No overnight events.  Eager to go to rehab.  She is almost back to her normal mentation, still has some episodes of confusion.   Discharge Exam: Vitals:   01/08/20 0403 01/08/20 0819  BP: 108/69   Pulse: 86   Resp: 18   Temp: 98.5 F (36.9 C) 98.3 F (36.8 C)  SpO2:     Vitals:   01/07/20 2356 01/08/20 0403 01/08/20 0500 01/08/20 0819  BP:  108/69    Pulse:  86    Resp: 20 18    Temp: 98.2 F (36.8 C) 98.5 F (36.9 C)  98.3 F (36.8 C)  TempSrc: Oral Oral    SpO2: 98%     Weight:   75 kg   Height:        General: Pt is alert, awake, not in acute distress, icteric, chronically sick looking. Cardiovascular: RRR, S1/S2 +, no rubs, no gallops Respiratory: CTA bilaterally, no wheezing, no rhonchi Abdominal: Soft, NT, ND, bowel sounds + Extremities: no edema, no cyanosis    The results of significant diagnostics from this hospitalization (  including imaging, microbiology, ancillary and laboratory) are listed below for reference.     Microbiology: Recent Results (from the past 240 hour(s))  Culture, respiratory (non-expectorated)     Status: None   Collection Time: 12/31/19 12:14 PM   Specimen: Tracheal Aspirate; Respiratory  Result Value Ref Range Status   Specimen Description TRACHEAL ASPIRATE  Final   Special Requests NONE  Final   Gram Stain   Final    RARE WBC PRESENT,BOTH PMN AND MONONUCLEAR RARE BUDDING YEAST SEEN Performed at [facility], [place] [address], [place], [place] [address]    Culture RARE CANDIDA KEFYR  Final   Report Status 01/02/2020 FINAL  Final  MRSA  PCR Screening     Status: None   Collection Time: 12/31/19  9:16 PM   Specimen: Nasal Mucosa; Nasopharyngeal  Result Value Ref Range Status   MRSA by PCR NEGATIVE NEGATIVE Final    Comment:        The GeneXpert MRSA Assay (FDA approved for NASAL specimens only), is one component of a comprehensive MRSA colonization surveillance program. It is not intended to diagnose MRSA infection nor to guide or monitor treatment for MRSA infections. Performed at [facility], [place] [address] [place], [place] [address]   Culture, respiratory (non-expectorated)     Status: None   Collection Time: 01/03/20 11:36 AM   Specimen: Tracheal Aspirate; Respiratory  Result Value Ref Range Status   Specimen Description TRACHEAL ASPIRATE  Final   Special Requests NONE  Final   Gram Stain   Final    MODERATE WBC PRESENT, PREDOMINANTLY PMN NO SQUAMOUS EPITHELIAL CELLS SEEN RARE BUDDING YEAST SEEN Performed at [facility], [address], [place], [place] [address]    Culture FEW CANDIDA KEFYR  Final   Report Status 01/05/2020 FINAL  Final     Labs: BNP (last 3 results) No results for input(s): BNP in the last 8760 hours. Basic Metabolic Panel: Recent Labs  Lab 01/02/20 0323 01/02/20 0323 01/03/20 0620 01/03/20 0620 01/04/20 9735 01/05/20 0725 01/06/20 0717 01/07/20 0534 01/08/20 0426  NA 153*   < > 147*   < > 147* 145 138 139 138  K 3.8   < > 3.8   < > 4.1 3.6 3.6 3.6 3.8  CL 122*   < > 115*   < > 115* 116* 106 109 108  CO2 24   < > 24   < > 22 22 23 23 22   GLUCOSE 123*   < > 127*   < > 124* 134* 107* 96 121*  BUN 11   < > 5*   < > 14 15 9 8 6   CREATININE 0.62   < > 0.50   < > 0.58 0.56 0.51 0.56 0.64  CALCIUM 8.6*   < > 8.8*   < > 8.7* 8.6* 8.5* 8.6* 8.6*  MG 2.4  --  2.2  --   --   --   --   --   --   PHOS 2.7  --  2.6  --   --   --   --   --   --    < > = values in this interval not displayed.   Liver Function Tests: Recent Labs  Lab 01/04/20 0653 01/05/20 0725  01/06/20 0717 01/07/20 0534 01/08/20 0426  AST 205* 191* 181* 169* 131*  ALT 47* 54* 56* 60* 55*  ALKPHOS 174* 166* 158* 164* 157*  BILITOT  15.1* 11.8* 10.4* 10.4* 9.3*  PROT 6.6 6.1* 5.9* 6.5 6.2*  ALBUMIN 2.3* 2.1* 2.0* 2.0* 2.1*   No results for input(s): LIPASE, AMYLASE in the last 168 hours. Recent Labs  Lab 01/08/20 0427  AMMONIA 56*   CBC: Recent Labs  Lab 01/04/20 0653 01/05/20 0725 01/06/20 0348 01/07/20 0534 01/08/20 0426  WBC 21.0* 18.4* 23.2* 18.6* 16.9*  NEUTROABS  --   --  21.1*  --   --   HGB 7.8* 7.7* 8.1* 7.7* 7.0*  HCT 26.6* 25.9* 26.5* 25.4* 22.8*  MCV 119.3* 117.7* 117.3* 116.5* 115.2*  PLT 38* 38* 58* 51* 53*   Cardiac Enzymes: No results for input(s): CKTOTAL, CKMB, CKMBINDEX, TROPONINI in the last 168 hours. BNP: Invalid input(s): POCBNP CBG: Recent Labs  Lab 01/07/20 1553 01/07/20 2033 01/07/20 2359 01/08/20 0311 01/08/20 0813  GLUCAP 139* 157* 105* 133* 113*   D-Dimer No results for input(s): DDIMER in the last 72 hours. Hgb A1c No results for input(s): HGBA1C in the last 72 hours. Lipid Profile No results for input(s): CHOL, HDL, LDLCALC, TRIG, CHOLHDL, LDLDIRECT in the last 72 hours. Thyroid function studies No results for input(s): TSH, T4TOTAL, T3FREE, THYROIDAB in the last 72 hours.  Invalid input(s): FREET3 Anemia work up No results for input(s): VITAMINB12, FOLATE, FERRITIN, TIBC, IRON, RETICCTPCT in the last 72 hours. Urinalysis    Component Value Date/Time   COLORURINE AMBER (A) 12/26/2019 0920   APPEARANCEUR CLOUDY (A) 12/26/2019 0920   LABSPEC 1.019 12/26/2019 0920   PHURINE 5.0 12/26/2019 0920   GLUCOSEU NEGATIVE 12/26/2019 0920   HGBUR NEGATIVE 12/26/2019 0920   BILIRUBINUR MODERATE (A) 12/26/2019 0920   KETONESUR 5 (A) 12/26/2019 0920   PROTEINUR 30 (A) 12/26/2019 0920   NITRITE NEGATIVE 12/26/2019 0920   LEUKOCYTESUR NEGATIVE 12/26/2019 0920   Sepsis Labs Invalid input(s): PROCALCITONIN,  WBC,   LACTICIDVEN Microbiology Recent Results (from the past 240 hour(s))  Culture, respiratory (non-expectorated)     Status: None   Collection Time: 12/31/19 12:14 PM   Specimen: Tracheal Aspirate; Respiratory  Result Value Ref Range Status   Specimen Description TRACHEAL ASPIRATE  Final   Special Requests NONE  Final   Gram Stain   Final    RARE WBC PRESENT,BOTH PMN AND MONONUCLEAR RARE BUDDING YEAST SEEN Performed at [facility], [place] [address] [place], [place] [address]    Culture RARE CANDIDA KEFYR  Final   Report Status 01/02/2020 FINAL  Final  MRSA PCR Screening     Status: None   Collection Time: 12/31/19  9:16 PM   Specimen: Nasal Mucosa; Nasopharyngeal  Result Value Ref Range Status   MRSA by PCR NEGATIVE NEGATIVE Final    Comment:        The GeneXpert MRSA Assay (FDA approved for NASAL specimens only), is one component of a comprehensive MRSA colonization surveillance program. It is not intended to diagnose MRSA infection nor to guide or monitor treatment for MRSA infections. Performed at [facility], [place] [address], [place],  [address]   Culture, respiratory (non-expectorated)     Status: None   Collection Time: 01/03/20 11:36 AM   Specimen: Tracheal Aspirate; Respiratory  Result Value Ref Range Status   Specimen Description TRACHEAL ASPIRATE  Final   Special Requests NONE  Final   Gram Stain   Final    MODERATE WBC PRESENT, PREDOMINANTLY PMN NO SQUAMOUS EPITHELIAL CELLS SEEN RARE BUDDING YEAST SEEN Performed at [facility], [address] [place], [place] [address]    Culture FEW CANDIDA KEFYR  Final   Report Status 01/05/2020 FINAL  Final     Time coordinating discharge:  40 minutes  SIGNED:   Barb Merino, MD  [facility] 01/08/2020, 11:43 AM

## 2020-01-08 NOTE — H&P (Signed)
Physical Medicine and Rehabilitation Admission H&P    Chief Complaint  Patient presents with  . Psychiatric Evaluation  . Alcohol Problem  : HPI: Georgi L. Nobis is a 39 year old right-handed female with history of hypertension, alcohol dependence with history of alcoholic pancreatitis seen by [facility]ology in the past.  She reportedly has failed rehab multiple times. History taken from chart review and daughter due to cognition. Patient lives with spouse and children.  Independent prior to admission.  Two-level home bed and bath on main level. She presented on 12/23/2019 with AMS. Cranial CT unremarkable for acute intracranial process. Ultrasound the abdomen showed hepatic steatosis with subtle nodularity of the liver capsule reflecting cirrhosis.  Minimal abdominal ascites most prominent in the right upper quadrant.  Admission chemistry sodium 128, potassium 2.3, creatinine 1.07, chloride 89, total bilirubin 26.6, AST 143, ammonia 137, alkaline phos 197.  Alcohol level of 13, hemoglobin 9.9, platelets 137,000.  She did require initial intubation for airway protection.  Gastroenterology services consulted for anemia positive guaiac stool and underwent GI endoscopy 12/27/2019 which was unremarkable for bleed. Hospital course further complicated by thrombocytopenia, requiring 1 unit of fresh frozen plasma for coagulopathy thrombocytopenia/anemia with latest platelets 53,000 and hemoglobin 7.0 secondary to alcohol hepatitis and chronic liver disease.  Leukocytosis 16,900 felt to recent steroid-induced.  Maintained on Xifixan for her chronic liver disease as well as course of prednisolone x28 days for her acute alcoholic hepatitis.  Latest chemistries much improved sodium 138, potassium 3.8 ammonia level currently 56 she remains on Chronulac.  Bouts of orthostasis currently maintained on ProAmatine.  Palliative care was consulted to establish goals of care.  Tolerating a regular diet.  Therapy evaluations  completed and patient was admitted for a comprehensive rehab program. Please see preadmission assessment from earlier today as well.  Review of Systems  Unable to perform ROS: Mental acuity   Past Medical History:  Diagnosis Date  . Alcohol abuse   . Anxiety   . Hypertension   . Liver disease    Past Surgical History:  Procedure Laterality Date  . ECTOPIC PREGNANCY SURGERY    . ESOPHAGOGASTRODUODENOSCOPY (EGD) WITH PROPOFOL N/A 12/27/2019   Procedure: ESOPHAGOGASTRODUODENOSCOPY (EGD) WITH PROPOFOL;  Surgeon: Carol Ada, MD;  Location: [facility];  Service: Endoscopy;  Laterality: N/A;   Family History  Problem Relation Age of Onset  . Healthy Mother   . Alcohol abuse Mother        quit in her 68s  . Alcohol abuse Father   . Diabetes Father   . Healthy Brother   . Hypertension Maternal Grandmother   . Hypertension Maternal Grandfather   . Healthy Brother   . Healthy Brother    Social History:  reports that she has never smoked. She has never used smokeless tobacco. She reports current alcohol use. She reports that she does not use drugs. Allergies: No Known Allergies Medications Prior to Admission  Medication Sig Dispense Refill  . hydrOXYzine (ATARAX/VISTARIL) 25 MG tablet Take 25 mg by mouth every 6 (six) hours as needed for anxiety.    . Multiple Vitamins-Minerals (MULTIVITAMIN WITH MINERALS) tablet Take 1 tablet by mouth daily.    Marland Kitchen topiramate (TOPAMAX) 200 MG tablet Take 200 mg by mouth 2 (two) times daily.    Marland Kitchen disulfiram (ANTABUSE) 250 MG tablet Take 250 mg by mouth daily.    . folic acid (FOLVITE) 1 MG tablet Take 1 mg by mouth daily.    . Multiple Vitamin (MULTIVITAMIN WITH  MINERALS) TABS tablet Take 1 tablet by mouth 2 (two) times a week.     . traZODone (DESYREL) 50 MG tablet Take 50 mg by mouth at bedtime.    Marland Kitchen venlafaxine (EFFEXOR) 75 MG tablet Take 75 mg by mouth daily.      Drug Regimen Review Drug regimen was reviewed and remains appropriate with no  significant issues identified  Home: Home Living Family/patient expects to be discharged to:: Private residence Living Arrangements: Spouse/significant other, Children Available Help at Discharge: Family, Available 24 hours/day Type of Home: House Home Access: Level entry Home Layout: Two level, Able to live on main level with bedroom/bathroom Bathroom Shower/Tub: Multimedia programmer: Standard Bathroom Accessibility: Yes Home Equipment: Environmental consultant - 2 wheels, Cane - single point, Bedside commode, Shower seat - built in(unsure of accuracy)   Functional History: Prior Function Level of Independence: Independent  Functional Status:  Mobility: Bed Mobility Overal bed mobility: Needs Assistance Bed Mobility: Supine to Sit Supine to sit: Min assist General bed mobility comments: pt able to get into sitting with use of bed rails; assist needed to scoot hips to EOB; cues to stay on task  Transfers Overall transfer level: Needs assistance Equipment used: Rolling walker (2 wheeled) Transfers: Sit to/from Stand Sit to Stand: Mod assist Stand pivot transfers: Mod assist, +2 physical assistance General transfer comment: cues for hand placement; assistance required to power up into standing and gain balanace upon stand due to poserior bias initially Ambulation/Gait Ambulation/Gait assistance: Min assist, Mod assist, +2 safety/equipment Gait Distance (Feet): 100 Feet Assistive device: Rolling walker (2 wheeled) Gait Pattern/deviations: Step-through pattern, Decreased step length - right, Decreased step length - left, Staggering left, Drifts right/left, Narrow base of support General Gait Details: cues for use of RW, maintaining safe proximity to RW, and increased bilat step lengths; assistance required for balance and managing AD; pt with multiple L lateral LOB  Gait velocity: decreased    ADL: ADL Overall ADL's : Needs assistance/impaired Eating/Feeding: Minimal  assistance Grooming: Moderate assistance Upper Body Bathing: Moderate assistance, Sitting Lower Body Bathing: Moderate assistance, Sit to/from stand Upper Body Dressing : Moderate assistance, Sitting Lower Body Dressing: Maximal assistance, Sit to/from stand Toilet Transfer: Moderate assistance, +2 for physical assistance, Stand-pivot, Cueing for safety, Cueing for sequencing Toileting- Clothing Manipulation and Hygiene: Total assistance Toileting - Clothing Manipulation Details (indicate cue type and reason): rectal foley; total assist for pericare adn to manage clothing Functional mobility during ADLs: Moderate assistance, +2 for physical assistance General ADL Comments: Poor awareness of safety during ADL tasks; Continues to say "I can do it, I don't need help"  Cognition: Cognition Overall Cognitive Status: Impaired/Different from baseline Orientation Level: Oriented X4 Cognition Arousal/Alertness: Awake/alert Behavior During Therapy: Flat affect Overall Cognitive Status: Impaired/Different from baseline Area of Impairment: Attention, Safety/judgement, Problem solving Current Attention Level: Sustained Safety/Judgement: Decreased awareness of safety, Decreased awareness of deficits Awareness: Emergent Problem Solving: Slow processing, Difficulty sequencing General Comments: pt fixated on her mother to pray; pt easily distracted and needs cues to attend to mobility tasks  Physical Exam: Blood pressure 108/69, pulse 86, temperature 98.5 F (36.9 C), temperature source Oral, resp. rate 18, height 5' 4"  (1.626 m), weight 75 kg, SpO2 98 %. Physical Exam  Vitals reviewed. Constitutional: She appears well-developed and well-nourished.  HENT:  Head: Normocephalic and atraumatic.  Eyes: EOM are normal. Right eye exhibits no discharge. Left eye exhibits no discharge. Scleral icterus is present.  Neck: No tracheal deviation present. No thyromegaly present.  Respiratory: Effort normal.  No stridor. No respiratory distress.  Musculoskeletal:     Comments: No edema or tenderness in extremities  Neurological: She is alert.  Makes good eye contact with examiner.   Some delay in processing for appropriate place.   She does have some decreased awareness of her deficits. Motor: Grossly 4+/5 throughout  Skin: Skin is warm and dry.  Psychiatric: Her speech is tangential. She is hyperactive. Cognition and memory are impaired. She expresses impulsivity.  Perseverative and confused.    Results for orders placed or performed during the hospital encounter of 12/23/19 (from the past 48 hour(s))  Comprehensive metabolic panel     Status: Abnormal   Collection Time: 01/06/20  7:17 AM  Result Value Ref Range   Sodium 138 135 - 145 mmol/L   Potassium 3.6 3.5 - 5.1 mmol/L   Chloride 106 98 - 111 mmol/L   CO2 23 22 - 32 mmol/L   Glucose, Bld 107 (H) 70 - 99 mg/dL    Comment: Glucose reference range applies only to samples taken after fasting for at least 8 hours.   BUN 9 6 - 20 mg/dL   Creatinine, Ser 0.51 0.44 - 1.00 mg/dL   Calcium 8.5 (L) 8.9 - 10.3 mg/dL   Total Protein 5.9 (L) 6.5 - 8.1 g/dL   Albumin 2.0 (L) 3.5 - 5.0 g/dL   AST 181 (H) 15 - 41 U/L   ALT 56 (H) 0 - 44 U/L   Alkaline Phosphatase 158 (H) 38 - 126 U/L   Total Bilirubin 10.4 (H) 0.3 - 1.2 mg/dL   GFR calc non Af Amer >60 >60 mL/min   GFR calc Af Amer >60 >60 mL/min   Anion gap 9 5 - 15    Comment: Performed at [facility] [address] [place], [place] [address]  Glucose, capillary     Status: Abnormal   Collection Time: 01/06/20  7:19 AM  Result Value Ref Range   Glucose-Capillary 100 (H) 70 - 99 mg/dL    Comment: Glucose reference range applies only to samples taken after fasting for at least 8 hours.  Glucose, capillary     Status: Abnormal   Collection Time: 01/06/20 11:56 AM  Result Value Ref Range   Glucose-Capillary 111 (H) 70 - 99 mg/dL    Comment: Glucose reference range applies only to  samples taken after fasting for at least 8 hours.  Glucose, capillary     Status: Abnormal   Collection Time: 01/06/20  3:26 PM  Result Value Ref Range   Glucose-Capillary 159 (H) 70 - 99 mg/dL    Comment: Glucose reference range applies only to samples taken after fasting for at least 8 hours.  Glucose, capillary     Status: Abnormal   Collection Time: 01/06/20  9:14 PM  Result Value Ref Range   Glucose-Capillary 133 (H) 70 - 99 mg/dL    Comment: Glucose reference range applies only to samples taken after fasting for at least 8 hours.   Comment 1 Notify RN    Comment 2 Document in Chart   Glucose, capillary     Status: Abnormal   Collection Time: 01/06/20 11:31 PM  Result Value Ref Range   Glucose-Capillary 162 (H) 70 - 99 mg/dL    Comment: Glucose reference range applies only to samples taken after fasting for at least 8 hours.   Comment 1 Notify RN    Comment 2 Document in Chart   Glucose, capillary  Status: None   Collection Time: 01/07/20  3:22 AM  Result Value Ref Range   Glucose-Capillary 84 70 - 99 mg/dL    Comment: Glucose reference range applies only to samples taken after fasting for at least 8 hours.   Comment 1 Notify RN    Comment 2 Document in Chart   CBC     Status: Abnormal   Collection Time: 01/07/20  5:34 AM  Result Value Ref Range   WBC 18.6 (H) 4.0 - 10.5 K/uL   RBC 2.18 (L) 3.87 - 5.11 MIL/uL   Hemoglobin 7.7 (L) 12.0 - 15.0 g/dL   HCT 25.4 (L) 36.0 - 46.0 %   MCV 116.5 (H) 80.0 - 100.0 fL   MCH 35.3 (H) 26.0 - 34.0 pg   MCHC 30.3 30.0 - 36.0 g/dL   RDW Not Measured 11.5 - 15.5 %   Platelets 51 (L) 150 - 400 K/uL    Comment: Immature Platelet Fraction may be clinically indicated, consider ordering this additional test [id number] REPEATED TO VERIFY    nRBC 0.0 0.0 - 0.2 %    Comment: Performed at [facility], [place] [address] [place], [place] [address]  Comprehensive metabolic panel     Status: Abnormal   Collection Time: 01/07/20  5:34 AM   Result Value Ref Range   Sodium 139 135 - 145 mmol/L   Potassium 3.6 3.5 - 5.1 mmol/L   Chloride 109 98 - 111 mmol/L   CO2 23 22 - 32 mmol/L   Glucose, Bld 96 70 - 99 mg/dL    Comment: Glucose reference range applies only to samples taken after fasting for at least 8 hours.   BUN 8 6 - 20 mg/dL   Creatinine, Ser 0.56 0.44 - 1.00 mg/dL   Calcium 8.6 (L) 8.9 - 10.3 mg/dL   Total Protein 6.5 6.5 - 8.1 g/dL   Albumin 2.0 (L) 3.5 - 5.0 g/dL   AST 169 (H) 15 - 41 U/L   ALT 60 (H) 0 - 44 U/L   Alkaline Phosphatase 164 (H) 38 - 126 U/L   Total Bilirubin 10.4 (H) 0.3 - 1.2 mg/dL   GFR calc non Af Amer >60 >60 mL/min   GFR calc Af Amer >60 >60 mL/min   Anion gap 7 5 - 15    Comment: Performed at [facility] [address], [place], [place] [address]  Glucose, capillary     Status: None   Collection Time: 01/07/20  8:34 AM  Result Value Ref Range   Glucose-Capillary 85 70 - 99 mg/dL    Comment: Glucose reference range applies only to samples taken after fasting for at least 8 hours.  Glucose, capillary     Status: Abnormal   Collection Time: 01/07/20 11:44 AM  Result Value Ref Range   Glucose-Capillary 110 (H) 70 - 99 mg/dL    Comment: Glucose reference range applies only to samples taken after fasting for at least 8 hours.  Glucose, capillary     Status: Abnormal   Collection Time: 01/07/20  3:53 PM  Result Value Ref Range   Glucose-Capillary 139 (H) 70 - 99 mg/dL    Comment: Glucose reference range applies only to samples taken after fasting for at least 8 hours.  Glucose, capillary     Status: Abnormal   Collection Time: 01/07/20  8:33 PM  Result Value Ref Range   Glucose-Capillary 157 (H) 70 - 99 mg/dL    Comment: Glucose reference range  applies only to samples taken after fasting for at least 8 hours.  Glucose, capillary     Status: Abnormal   Collection Time: 01/07/20 11:59 PM  Result Value Ref Range   Glucose-Capillary 105 (H) 70 - 99 mg/dL    Comment: Glucose  reference range applies only to samples taken after fasting for at least 8 hours.   Comment 1 Notify RN    Comment 2 Document in Chart   Glucose, capillary     Status: Abnormal   Collection Time: 01/08/20  3:11 AM  Result Value Ref Range   Glucose-Capillary 133 (H) 70 - 99 mg/dL    Comment: Glucose reference range applies only to samples taken after fasting for at least 8 hours.   Comment 1 Notify RN    Comment 2 Document in Chart   Comprehensive metabolic panel     Status: Abnormal   Collection Time: 01/08/20  4:26 AM  Result Value Ref Range   Sodium 138 135 - 145 mmol/L   Potassium 3.8 3.5 - 5.1 mmol/L   Chloride 108 98 - 111 mmol/L   CO2 22 22 - 32 mmol/L   Glucose, Bld 121 (H) 70 - 99 mg/dL    Comment: Glucose reference range applies only to samples taken after fasting for at least 8 hours.   BUN 6 6 - 20 mg/dL   Creatinine, Ser 0.64 0.44 - 1.00 mg/dL   Calcium 8.6 (L) 8.9 - 10.3 mg/dL   Total Protein 6.2 (L) 6.5 - 8.1 g/dL   Albumin 2.1 (L) 3.5 - 5.0 g/dL   AST 131 (H) 15 - 41 U/L   ALT 55 (H) 0 - 44 U/L   Alkaline Phosphatase 157 (H) 38 - 126 U/L   Total Bilirubin 9.3 (H) 0.3 - 1.2 mg/dL   GFR calc non Af Amer >60 >60 mL/min   GFR calc Af Amer >60 >60 mL/min   Anion gap 8 5 - 15    Comment: Performed at [facility] [address] [place], [place] [address]  Ammonia     Status: Abnormal   Collection Time: 01/08/20  4:27 AM  Result Value Ref Range   Ammonia 56 (H) 9 - 35 umol/L    Comment: Performed at [facility], [place] [address], [place],  [address]   No results found.     Medical Problem List and Plan: 1.  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  Continue lactulose as directed as well as prednisolone x28 days total per GI services as well as Xifaxan for chronic liver disease  -patient may shower  -ELOS/Goals: 7-12 days/supervision/min a  Admit to CIR 2.   Antithrombotics: -DVT/anticoagulation: SCDs  -antiplatelet therapy: N/A 3. Pain Management: Tylenol 4. Mood: Trazodone 25 mg nightly  -antipsychotic agents: N/A 5. Neuropsych: This patient is not capable of making decisions on her own behalf. 6. Skin/Wound Care: Routine skin checks 7. Fluids/Electrolytes/Nutrition: Routine in and outs. CMP ordered. 8.  Coagulopathy/thrombocytopenia/chronic macrocytic anemia/leukocytosis.  Leukocytosis felt to be related to prednisolone.  Follow-up chemistries.  Transfuse if symptomatic  CBC ordered. 9.  Orthostasis.  ProAmatine 3 times daily.  Monitor with increased mobility. 10.  Decreased nutritional storage.  Follow-up dietary services  Supplementation  Cathlyn Parsons, PA-C 01/08/2020  I have personally performed a face to face diagnostic evaluation, including, but not limited to relevant history and physical exam findings, of this patient and developed relevant  assessment and plan.  Additionally, I have reviewed and concur with the physician assistant's documentation above.  Delice Lesch, MD, ABPMR  The patient's status has not changed. The original post admission physician evaluation remains appropriate, and any changes from the pre-admission screening or documentation from the acute chart are noted above.   Delice Lesch, MD, ABPMR

## 2020-01-08 NOTE — TOC Transition Note (Signed)
Transition of Care [facility]) - CM/SW Discharge Note   Patient Details  Name: Joann [name] MRN: [id number] Date of Birth: 06/05/1981  [facility] of [facility]) CM/SW Contact:  Pollie Friar, RN Phone Number: 01/08/2020, 11:40 AM   Clinical Narrative:    Pt discharging to CIR today. CM signing off.   Final next level of care: IP Rehab Facility Barriers to Discharge: No Barriers Identified   Patient Goals and CMS Choice        Discharge Placement                       Discharge Plan and Services                                     Social Determinants of Health (SDOH) Interventions     Readmission Risk Interventions No flowsheet data found.

## 2020-01-08 NOTE — Progress Notes (Signed)
PROGRESS NOTE    Joann [name]  YSA:[id number] DOB: [birth year] DOA: 12/23/2019 PCP: Nelwyn Salisbury, PA-C    Brief Narrative:  39 year old female with alcohol abuse and alcoholic hepatitis who presented with worsening mental status.  She was on the medical floor and treated for acute hepatic encephalopathy, acute alcoholic hepatitis, AKI and coagulopathy.  GI was following.  Mental status has been waxing and waning.  On 4/22, became more lethargic and was intubated and transferred to ICU to protect her airway. 4/14 admitted to [facility] 4/18 upper GI endoscopy, no bleeding lesion found.  Remains on the steroids. 4/22 intubated and admitted to ICU 4/27 extubated to room air 4/28 transfer to medical floor.  Assessment & Plan:   Principal Problem:   Hepatic encephalopathy (HCC) Active Problems:   Alcohol dependence with withdrawal, unspecified (HCC)   Thrombocytopenia (HCC)   Hypokalemia due to inadequate potassium intake   Alcoholic hepatitis without ascites   Ascites   Advanced care planning/counseling discussion   Goals of care, counseling/discussion   Palliative care by specialist   Hypoalbuminemia   Acute respiratory failure ([place])   Palliative care encounter   Elevated bilirubin   Alcoholic hepatitis with ascites   Pressure injury of skin   AMS (altered mental status)   Sleep disturbance  Hepatic encephalopathy/acute alcoholic hepatitis with alcohol dependence with withdrawal and chronic liver disease: Some clinical improvement on mentation.  Rocephin, finished 5 days therapy.  Remains on rifaximin, will continue on discharge. Prednisone 40 mg daily, day 13/28, will have prolonged taper if patient remains compliant. Lactulose with aggressive bowel regimen to decrease ammonia levels. Close monitoring of LFTs. Multiple counseling, rehab.  Respiratory failure in the setting of presumed aspiration: Improved.  On room air now.  Leukocytosis: On prednisone.  No evidence of  bacterial infection.  Finished 5 days of Rocephin therapy.  We will continue to monitor.  Anemia/ Coagulopathy/thrombocytopenia secondary to alcohol hepatitis and chronic liver disease: Acute bleeding from right naris, stabilized.  Platelets more than 50,000.  Was given 1 unit of platelets.  We will continue to monitor closely. Hemoglobin is 7.  No active bleeding.  Will not transfuse until symptomatic or less than 6.  Goal of care: Followed by palliative medicine.  Currently patient is determined to quit alcohol.  Work with PT OT and speech.  Refer to physical therapy rehab.   DVT prophylaxis: SCDs Code Status: Partial, no CPR but full scope of treatment Family Communication: None Disposition Plan: Status is: Inpatient  Remains inpatient appropriate because:Inpatient level of care appropriate due to severity of illness   Dispo: The patient is from: Home              Anticipated d/c is to: CIR              Anticipated d/c date is: 1 day              Patient currently medically stable.          Consultants:   Gastroenterology, signed off  PCCM, signed off  Palliative medicine  Procedures:   EGD, 4/18, no active bleeding.  Antimicrobials:  Antibiotics Given (last 72 hours)    Date/Time Action Medication Dose   01/05/20 2238 Given   rifaximin (XIFAXAN) tablet 550 mg 550 mg   01/06/20 1048 Given   rifaximin (XIFAXAN) tablet 550 mg 550 mg   01/06/20 2108 Given   rifaximin (XIFAXAN) tablet 550 mg 550 mg   01/07/20 0926 Given   rifaximin (  XIFAXAN) tablet 550 mg 550 mg   01/07/20 2105 Given   rifaximin (XIFAXAN) tablet 550 mg 550 mg   01/08/20 0943 Given   rifaximin (XIFAXAN) tablet 550 mg 550 mg         Subjective: Patient seen and examined.  No overnight events.  She looks more comfortable, however he still has some confusion. "I am not going anywhere, I will wait in the hospital until I get better."  Objective: Vitals:   01/07/20 2356 01/08/20 0403  01/08/20 0500 01/08/20 0819  BP:  108/69    Pulse:  86    Resp: 20 18    Temp: 98.2 F (36.8 C) 98.5 F (36.9 C)  98.3 F (36.8 C)  TempSrc: Oral Oral    SpO2: 98%     Weight:   75 kg   Height:        Intake/Output Summary (Last 24 hours) at 01/08/2020 1105 Last data filed at 01/08/2020 1032 Gross per 24 hour  Intake 170 ml  Output 1775 ml  Net -1605 ml   Filed Weights   01/06/20 0522 01/07/20 0500 01/08/20 0500  Weight: 70 kg 76 kg 75 kg    Examination:  General exam: Chronically sick looking.  Comfortable on room air. Icteric. Respiratory system: Clear to auscultation. Respiratory effort normal.  No added sounds Cardiovascular system: S1 & S2 heard, RRR.  Gastrointestinal system: Abdomen is nondistended, soft and nontender.  Central nervous system: Alert and oriented x3.  Occasionally impulsive.  Able to move all extremities.  Anxious and flat affect.    Data Reviewed: I have personally reviewed following labs and imaging studies  CBC: Recent Labs  Lab 01/04/20 0653 01/05/20 0725 01/06/20 0348 01/07/20 0534 01/08/20 0426  WBC 21.0* 18.4* 23.2* 18.6* 16.9*  NEUTROABS  --   --  21.1*  --   --   HGB 7.8* 7.7* 8.1* 7.7* 7.0*  HCT 26.6* 25.9* 26.5* 25.4* 22.8*  MCV 119.3* 117.7* 117.3* 116.5* 115.2*  PLT 38* 38* 58* 51* 53*   Basic Metabolic Panel: Recent Labs  Lab 01/02/20 0323 01/02/20 0323 01/03/20 0620 01/03/20 0620 01/04/20 0653 01/05/20 0725 01/06/20 0717 01/07/20 0534 01/08/20 0426  NA 153*   < > 147*   < > 147* 145 138 139 138  K 3.8   < > 3.8   < > 4.1 3.6 3.6 3.6 3.8  CL 122*   < > 115*   < > 115* 116* 106 109 108  CO2 24   < > 24   < > 22 22 23 23 22   GLUCOSE 123*   < > 127*   < > 124* 134* 107* 96 121*  BUN 11   < > 5*   < > 14 15 9 8 6   CREATININE 0.62   < > 0.50   < > 0.58 0.56 0.51 0.56 0.64  CALCIUM 8.6*   < > 8.8*   < > 8.7* 8.6* 8.5* 8.6* 8.6*  MG 2.4  --  2.2  --   --   --   --   --   --   PHOS 2.7  --  2.6  --   --   --   --    --   --    < > = values in this interval not displayed.   GFR: Estimated Creatinine Clearance: 92.7 mL/min (by C-G formula based on SCr of 0.64 mg/dL). Liver Function Tests: Recent Labs  Lab 01/04/20 5188 01/05/20  0725 01/06/20 0717 01/07/20 0534 01/08/20 0426  AST 205* 191* 181* 169* 131*  ALT 47* 54* 56* 60* 55*  ALKPHOS 174* 166* 158* 164* 157*  BILITOT 15.1* 11.8* 10.4* 10.4* 9.3*  PROT 6.6 6.1* 5.9* 6.5 6.2*  ALBUMIN 2.3* 2.1* 2.0* 2.0* 2.1*   No results for input(s): LIPASE, AMYLASE in the last 168 hours. Recent Labs  Lab 01/08/20 0427  AMMONIA 56*   Coagulation Profile: Recent Labs  Lab 01/02/20 0323 01/02/20 0522  INR 1.9* 1.8*   Cardiac Enzymes: No results for input(s): CKTOTAL, CKMB, CKMBINDEX, TROPONINI in the last 168 hours. BNP (last 3 results) No results for input(s): PROBNP in the last 8760 hours. HbA1C: No results for input(s): HGBA1C in the last 72 hours. CBG: Recent Labs  Lab 01/07/20 1553 01/07/20 2033 01/07/20 2359 01/08/20 0311 01/08/20 0813  GLUCAP 139* 157* 105* 133* 113*   Lipid Profile: No results for input(s): CHOL, HDL, LDLCALC, TRIG, CHOLHDL, LDLDIRECT in the last 72 hours. Thyroid Function Tests: No results for input(s): TSH, T4TOTAL, FREET4, T3FREE, THYROIDAB in the last 72 hours. Anemia Panel: No results for input(s): VITAMINB12, FOLATE, FERRITIN, TIBC, IRON, RETICCTPCT in the last 72 hours. Sepsis Labs: No results for input(s): PROCALCITON, LATICACIDVEN in the last 168 hours.  Recent Results (from the past 240 hour(s))  Culture, respiratory (non-expectorated)     Status: None   Collection Time: 12/31/19 12:14 PM   Specimen: Tracheal Aspirate; Respiratory  Result Value Ref Range Status   Specimen Description TRACHEAL ASPIRATE  Final   Special Requests NONE  Final   Gram Stain   Final    RARE WBC PRESENT,BOTH PMN AND MONONUCLEAR RARE BUDDING YEAST SEEN Performed at [facility], [place] [address] [place],  [place] [address]    Culture RARE CANDIDA KEFYR  Final   Report Status 01/02/2020 FINAL  Final  MRSA PCR Screening     Status: None   Collection Time: 12/31/19  9:16 PM   Specimen: Nasal Mucosa; Nasopharyngeal  Result Value Ref Range Status   MRSA by PCR NEGATIVE NEGATIVE Final    Comment:        The GeneXpert MRSA Assay (FDA approved for NASAL specimens only), is one component of a comprehensive MRSA colonization surveillance program. It is not intended to diagnose MRSA infection nor to guide or monitor treatment for MRSA infections. Performed at [facility], [place] [address] [place], [place] [address]   Culture, respiratory (non-expectorated)     Status: None   Collection Time: 01/03/20 11:36 AM   Specimen: Tracheal Aspirate; Respiratory  Result Value Ref Range Status   Specimen Description TRACHEAL ASPIRATE  Final   Special Requests NONE  Final   Gram Stain   Final    MODERATE WBC PRESENT, PREDOMINANTLY PMN NO SQUAMOUS EPITHELIAL CELLS SEEN RARE BUDDING YEAST SEEN Performed at [facility], [address] [place], [place] [address]    Culture FEW CANDIDA KEFYR  Final   Report Status 01/05/2020 FINAL  Final         Radiology Studies: No results found.      Scheduled Meds: . Chlorhexidine Gluconate Cloth  6 each Topical Daily  . feeding supplement (ENSURE ENLIVE)  237 mL Oral BID BM  . [START ON 12/15/5679] folic acid  1 mg Oral Daily  . lactulose  20 g Oral Q8H  . mouth rinse  15 mL Mouth Rinse BID  . midodrine  5 mg Oral TID WC  .  multivitamin with minerals  1 tablet Oral Daily  . pantoprazole  40 mg Oral Daily  . [START ON 01/09/2020] prednisoLONE  40 mg Oral Daily  . rifaximin  550 mg Oral BID  . sodium chloride flush  3 mL Intravenous Q12H  . [START ON 01/09/2020] thiamine  100 mg Oral Daily  . traZODone  25 mg Oral QHS  . [START ON 01/09/2020] zinc sulfate  220 mg Oral Daily   Continuous Infusions: . sodium chloride 10 mL/hr at 01/06/20 1200      LOS: 15 days    Time spent: 30 minutes    Barb Merino, MD [facility] Pager [phone]

## 2020-01-08 NOTE — Progress Notes (Signed)
Jamse Arn, MD  Physician  Physical Medicine and Rehabilitation  PMR Pre-admission     Signed  Date of Service:  01/08/2020 12:09 PM      Related encounter: ED to Hosp-Admission (Discharged) from 12/23/2019 in [facility]      Signed        Show:Clear all [x] Manual[x] Template[x] Copied  Added by: [x] Cristina Gong, RN[x] Jamse Arn, MD  [] Hover for details PMR Admission Coordinator Pre-Admission Assessment   Patient: Joann [name] is an 39 y.o., female MRN: [id number] DOB: [birth year] Height: 5' 4"  (162.6 cm) Weight: 75 kg   Insurance Information   PRIMARY: Medicaid [place] Access      Policy#: [id number]      Subscriber: pt Benefits:  Phone #: passport one online      Name: 4/29 Eff. Date: active MAFCN        The "Data Collection Information Summary" for patients in Inpatient Rehabilitation Facilities with attached "Privacy Act [place] Records" was provided and verbally reviewed with: N/A   Emergency Contact Information         Contact Information     Name Relation Home Work Mobile    [place] Spouse     [phone]    (unknown), [place] Daughter     [phone]    Moss,Erma Mother     [phone]         Current Medical History  Patient Admitting Diagnosis: Debility, encephalopathy   History of Present Illness: 39 year old right-handed female with history of hypertension, alcohol dependence with history of alcoholic pancreatitis seen by [facility] hepatology in the past.  She reportedly has failed rehab multiple times.  Presented 12/23/2019 with changes in mental status.  Cranial CT scan negative for acute changes.  Ultrasound the abdomen showed hepatic steatosis with subtle nodularity of the liver capsule reflecting cirrhosis.  Minimal abdominal ascites most prominent in the right upper quadrant.  Admission chemistry sodium 128, potassium 2.3, creatinine 1.07, chloride 89, total bilirubin 26.6, AST 143, ammonia 137,  alkaline phos 197.  Alcohol level of 13, hemoglobin 9.9, platelets 137,000.  She did require initial intubation for airway protection.  Gastroenterology services consulted for anemia positive guaiac stool and underwent GI endoscopy 12/27/2019 no bleeding lesion found.  She did receive 1 unit of fresh frozen plasma for coagulopathy thrombocytopenia/anemia with latest platelets 53,000 and hemoglobin 7.0 secondary to alcohol hepatitis and chronic liver disease.  Leukocytosis 16,900 felt to recent steroids.  Maintained on Xifixan for her chronic liver disease as well as course of prednisolone x28 days for her acute alcoholic hepatitis.  Latest chemistries much improved sodium 138, potassium 3.8 ammonia level currently 56 she remains on Chronulac.  Bouts of ortho stasis currently maintained on ProAmatine.  Palliative care was consulted to establish goals of care.  Tolerating a regular diet.     Patient's medical record from [facility]  has been reviewed by the rehabilitation admission coordinator and physician.   Past Medical History      Past Medical History:  Diagnosis Date  . Alcohol abuse    . Anxiety    . Hypertension    . Liver disease        Family History   family history includes Alcohol abuse in her father and mother; Diabetes in her father; Healthy in her brother, brother, brother, and mother; Hypertension in her maternal grandfather and maternal grandmother.   Prior Rehab/Hospitalizations Has the patient had prior rehab or hospitalizations prior to admission? Yes  Has the patient had major surgery during 100 days prior to admission? Yes              Current Medications   Current Facility-Administered Medications:  .  0.9 %  sodium chloride infusion, 250 mL, Intravenous, Continuous, Kara Mead V, MD, Last Rate: 10 mL/hr at 01/06/20 1200, Rate Verify at 01/06/20 1200 .  albuterol (PROVENTIL) (2.5 MG/3ML) 0.083% nebulizer solution 2.5 mg, 2.5 mg, Nebulization, Q4H PRN,  Arnell Asal, NP .  Chlorhexidine Gluconate Cloth 2 % PADS 6 each, 6 each, Topical, Daily, Jennelle Human B, NP, 6 each at 01/08/20 [phone] .  feeding supplement (ENSURE ENLIVE) (ENSURE ENLIVE) liquid 237 mL, 237 mL, Oral, BID BM, Audria Nine, DO, 237 mL at 01/08/20 0942 .  [START ON 10/15/971] folic acid (FOLVITE) tablet 1 mg, 1 mg, Oral, Daily, Ghimire, Kuber, MD .  lactulose (CHRONULAC) 10 GM/15ML solution 20 g, 20 g, Oral, Q8H, Ghimire, Kuber, MD, 20 g at 01/08/20 1122 .  MEDLINE mouth rinse, 15 mL, Mouth Rinse, BID, Audria Nine, DO, 15 mL at 01/08/20 0945 .  midodrine (PROAMATINE) tablet 5 mg, 5 mg, Oral, TID WC, Ghimire, Kuber, MD, 5 mg at 01/08/20 1121 .  multivitamin with minerals tablet 1 tablet, 1 tablet, Oral, Daily, Audria Nine, DO, 1 tablet at 01/08/20 [phone] .  ondansetron (ZOFRAN) tablet 4 mg, 4 mg, Oral, Q6H PRN **OR** ondansetron (ZOFRAN) injection 4 mg, 4 mg, Intravenous, Q6H PRN, Carol Ada, MD, 4 mg at 12/28/19 1009 .  pantoprazole (PROTONIX) EC tablet 40 mg, 40 mg, Oral, Daily, Ghimire, Kuber, MD, 40 mg at 01/08/20 1120 .  [START ON 01/09/2020] prednisoLONE tablet 40 mg, 40 mg, Oral, Daily, Ghimire, Kuber, MD .  rifaximin (XIFAXAN) tablet 550 mg, 550 mg, Oral, BID, Ghimire, Kuber, MD .  sodium chloride flush (NS) 0.9 % injection 10-40 mL, 10-40 mL, Intracatheter, PRN, Carol Ada, MD, 10 mL at 12/29/19 0515 .  sodium chloride flush (NS) 0.9 % injection 3 mL, 3 mL, Intravenous, Q12H, Carol Ada, MD, 3 mL at 01/08/20 0945 .  [START ON 01/09/2020] thiamine tablet 100 mg, 100 mg, Oral, Daily, Ghimire, Kuber, MD .  traZODone (DESYREL) tablet 25 mg, 25 mg, Oral, QHS, Mahan, Kasie J, NP, 25 mg at 01/07/20 2105 .  [START ON 01/09/2020] zinc sulfate capsule 220 mg, 220 mg, Oral, Daily, Ghimire, Kuber, MD   Patients Current Diet:     Diet Order                      Diet - low sodium heart healthy           Diet regular Room service appropriate? Yes; Fluid  consistency: Thin  Diet effective now                   Precautions / Restrictions Precautions Precautions: Fall Precaution Comments: flexiseal Restrictions Weight Bearing Restrictions: No    Has the patient had 2 or more falls or a fall with injury in the past year? 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 Independent   Indoor Mobility: Did the patient need assistance with walking from room to room (with or without device)? Independent   Stairs: Did the patient need assistance with internal or external stairs (with  or without device)? Independent   Functional Cognition: Did the patient need help planning regular tasks such as shopping or remembering to take medications? Independent   Home Assistive Devices / Equipment Home Equipment: Environmental consultant - 2 wheels, Cane - single point, Bedside commode, Shower seat - built in(unsure of accuracy)   Prior Device Use: Indicate devices/aids used by the patient prior to current illness, exacerbation or injury? None of the above   Current Functional Level Cognition   Overall Cognitive Status: Impaired/Different from baseline Current Attention Level: Sustained Orientation Level: Oriented X4 Safety/Judgement: Decreased awareness of safety, Decreased awareness of deficits General Comments: pt fixated on her mother to pray; pt easily distracted and needs cues to attend to mobility tasks    Extremity Assessment (includes Sensation/Coordination)   Upper Extremity Assessment: Generalized weakness RUE Deficits / Details: able to complete hand to mouth and touch front forehead; unable to reach back of head due to B shoulder weakness; PROM WFL; B weak grip strength RUE Coordination: (states having difficulty holding objects) LUE Deficits /  Details: similar to R  Lower Extremity Assessment: Generalized weakness     ADLs   Overall ADL's : Needs assistance/impaired Eating/Feeding: Minimal assistance Grooming: Moderate assistance Upper Body Bathing: Moderate assistance, Sitting Lower Body Bathing: Moderate assistance, Sit to/from stand Upper Body Dressing : Moderate assistance, Sitting Lower Body Dressing: Maximal assistance, Sit to/from stand Toilet Transfer: Moderate assistance, +2 for physical assistance, Stand-pivot, Cueing for safety, Cueing for sequencing Toileting- Clothing Manipulation and Hygiene: Total assistance Toileting - Clothing Manipulation Details (indicate cue type and reason): rectal foley; total assist for pericare adn to manage clothing Functional mobility during ADLs: Moderate assistance, +2 for physical assistance General ADL Comments: Poor awareness of safety during ADL tasks; Continues to say "I can do it, I don't need help"     Mobility   Overal bed mobility: Needs Assistance Bed Mobility: Supine to Sit Supine to sit: Min guard General bed mobility comments: min guard for safety; cues for sequencing; increased time and effort     Transfers   Overall transfer level: Needs assistance Equipment used: Rolling walker (2 wheeled) Transfers: Sit to/from Stand Sit to Stand: Mod assist Stand pivot transfers: Mod assist, +2 physical assistance General transfer comment: cues for hand placement; assistance required to power up into standing and gain balanace upon stand     Ambulation / Gait / Stairs / Wheelchair Mobility   Ambulation/Gait Ambulation/Gait assistance: Min assist, Mod assist, +2 safety/equipment Gait Distance (Feet): 160 Feet Assistive device: Rolling walker (2 wheeled) Gait Pattern/deviations: Step-through pattern, Staggering left, Drifts right/left, Decreased stride length General Gait Details: pt demonstrates carry over of safe use of AD from previous session; assistance required for  balance and intermittently to manag RW; pt has tendency for LOB to left and to drift to L side with RW; chair follow for safety Gait velocity: decreased     Posture / Balance Dynamic Sitting Balance Sitting balance - Comments: L lateral lean; pt unaware Balance Overall balance assessment: Needs assistance Sitting-balance support: Feet supported Sitting balance-Leahy Scale: Good Sitting balance - Comments: L lateral lean; pt unaware Standing balance support: Bilateral upper extremity supported, During functional activity Standing balance-Leahy Scale: Poor Standing balance comment: reliant on external support     Special needs/care consideration Rectal tube to be removed prior to admit as well as external bladder catheter Designated visitors are spouse , Izora Gala and daughter from out of town, [place] Pt's Mom, Derek Jack to be her caregiver after d/c  Previous Home Environment  Living Arrangements: (spouse and 57 year old son, [name])  Lives With: Spouse, Son Available Help at Discharge: Family, Available 24 hours/day Type of Home: House Home Layout: Two level, Able to live on main level with bedroom/bathroom Home Access: Level entry Bathroom Shower/Tub: Walk-in shower(and tub) Bathroom Toilet: Standard Bathroom Accessibility: Yes How Accessible: Accessible via walker [place]: No   Discharge Living Setting Plans for Discharge Living Setting: Patient's home, Lives with (comment)(spouse and 33 year old son, [name]) Type of Home at Discharge: House Discharge Home Layout: Two level, Able to live on main level with bedroom/bathroom Discharge Home Access: Level entry Discharge Bathroom Shower/Tub: Walk-in shower(and stand alone tub) Discharge Bathroom Toilet: Standard Discharge Bathroom Accessibility: Yes How Accessible: Accessible via walker Does the patient have any problems obtaining your medications?: No   Social/Family/Support Systems Patient Roles: Spouse,  Parent(LPN) Contact Information: spouse Izora Gala and adult daughter, Rob Bunting Anticipated Caregiver: Pt's 69 year old Mom, Marsh Dolly in town form Doctor, hospital area to assist with providing 24/7 assist at d/c Anticipated Caregiver's Contact Information: see above Ability/Limitations of Caregiver: Spouse works, Daughter, Futures trader on [employer], pt's Mom to be the caregiver longterm Caregiver Availability: 24/7 Discharge Plan Discussed with Primary Caregiver: Yes Is Caregiver In Agreement with Plan?: Yes Does Caregiver/Family have Issues with Lodging/Transportation while Pt is in Rehab?: No   Goals Patient/Family Goal for Rehab: Mod I to supervision wiht PT, OT, and SLP Expected length of stay: ELOS 10 to 14 days Pt/Family Agrees to Admission and willing to participate: Yes Program Orientation Provided & Reviewed with Pt/Caregiver Including Roles  & Responsibilities: Yes   Decrease burden of Care through IP rehab admission: n/a   Possible need for SNF placement upon discharge: not anticipated   Patient Condition: I have reviewed [facility]  spoken with CM, and patient and daughter. I met with patient at the bedside for inpatient rehabilitation assessment.  Patient will benefit from ongoing PT, OT and SLP, can actively participate in 3 hours of therapy a day 5 days of the week, and can make measurable gains during the admission.  Patient will also benefit from the coordinated team approach during an Inpatient Acute Rehabilitation admission.  The patient will receive intensive therapy as well as Rehabilitation physician, nursing, social worker, and care management interventions.  Due to bladder management, bowel management, safety, skin/wound care, disease management, medication administration, pain management and patient education the patient requires 24 hour a day rehabilitation nursing.  The patient is currently mod assist overall with mobility and basic ADLs.  Discharge setting  and therapy post discharge at home with home health is anticipated.  Patient has agreed to participate in the Acute Inpatient Rehabilitation Program and will admit today.   Preadmission Screen Completed By:  Cleatrice Burke, 01/08/2020 12:09 PM ______________________________________________________________________   Discussed status with Dr. Posey Pronto  on  01/08/2020 at 1220 and received approval for admission today.   Admission Coordinator:  Cleatrice Burke, RN, time  4782 Date  01/08/2020    Assessment/Plan: Diagnosis: Encephalopathy 1. Does the need for close, 24 hr/day Medical supervision in concert with the patient's rehab needs make it unreasonable for this patient to be served in a less intensive setting? Yes 2. Co-Morbidities requiring supervision/potential complications:  HTN (monitor and provide prns in accordance with increased physical exertion and pain), alcohol dependence (counsel when appropriate), history of alcoholic pancreatitis 3. Due to bladder management, safety, skin/wound care, disease management, medication administration and  patient education, does the patient require 24 hr/day rehab nursing? Yes 4. Does the patient require coordinated care of a physician, rehab nurse, PT, OT, and SLP to address physical and functional deficits in the context of the above medical diagnosis(es)? Yes Addressing deficits in the following areas: balance, endurance, locomotion, strength, transferring, bathing, dressing, toileting, cognition and psychosocial support 5. Can the patient actively participate in an intensive therapy program of at least 3 hrs of therapy 5 days a week? Yes 6. The potential for patient to make measurable gains while on inpatient rehab is excellent 7. Anticipated functional outcomes upon discharge from inpatient rehab: supervision PT, supervision OT, supervision SLP 8. Estimated rehab length of stay to reach the above functional goals is: 14-17  days. 9. Anticipated discharge destination: Home 10. Overall Rehab/Functional Prognosis: good     MD Signature: Delice Lesch, MD, ABPMR        Revision History

## 2020-01-08 NOTE — PMR Pre-admission (Signed)
PMR Admission Coordinator Pre-Admission Assessment  Patient: Joann [name] is an 39 y.o., female MRN: [id number] DOB: May 17, 1981 Height: 5' 4" (162.6 cm) Weight: 75 kg  Insurance Information  PRIMARY: Medicaid [employer]      Policy#: [id number]      Subscriber: pt Benefits:  Phone #: passport one online      Name: 4/29 Eff. Date: active MAFCN       The "Data Collection Information Summary" for patients in Inpatient Rehabilitation Facilities with attached "Privacy Act [place] Records" was provided and verbally reviewed with: N/A  Emergency Contact Information Contact Information    Name Relation Home Work Mobile   [place] Spouse   [phone]   (unknown), [place] Daughter   [phone]   Moss,Erma Mother   [phone]      Current Medical History  Patient Admitting Diagnosis: Debility, encephalopathy  History of Present Illness: 39 year old right-handed female with history of hypertension, alcohol dependence with history of alcoholic pancreatitis seen by [facility]ology in the past.  She reportedly has failed rehab multiple times.  Presented 12/23/2019 with changes in mental status.  Cranial CT scan negative for acute changes.  Ultrasound the abdomen showed hepatic steatosis with subtle nodularity of the liver capsule reflecting cirrhosis.  Minimal abdominal ascites most prominent in the right upper quadrant.  Admission chemistry sodium 128, potassium 2.3, creatinine 1.07, chloride 89, total bilirubin 26.6, AST 143, ammonia 137, alkaline phos 197.  Alcohol level of 13, hemoglobin 9.9, platelets 137,000.  She did require initial intubation for airway protection.  Gastroenterology services consulted for anemia positive guaiac stool and underwent GI endoscopy 12/27/2019 no bleeding lesion found.  She did receive 1 unit of fresh frozen plasma for coagulopathy thrombocytopenia/anemia with latest platelets 53,000 and hemoglobin 7.0 secondary to alcohol hepatitis and  chronic liver disease.  Leukocytosis 16,900 felt to recent steroids.  Maintained on Xifixan for her chronic liver disease as well as course of prednisolone x28 days for her acute alcoholic hepatitis.  Latest chemistries much improved sodium 138, potassium 3.8 ammonia level currently 56 she remains on Chronulac.  Bouts of ortho stasis currently maintained on ProAmatine.  Palliative care was consulted to establish goals of care.  Tolerating a regular diet.    Patient's medical record from [facility]  has been reviewed by the rehabilitation admission coordinator and physician.  Past Medical History  Past Medical History:  Diagnosis Date  . Alcohol abuse   . Anxiety   . Hypertension   . Liver disease     Family History   family history includes Alcohol abuse in her father and mother; Diabetes in her father; Healthy in her brother, brother, brother, and mother; Hypertension in her maternal grandfather and maternal grandmother.  Prior Rehab/Hospitalizations Has the patient had prior rehab or hospitalizations prior to admission? Yes  Has the patient had major surgery during 100 days prior to admission? Yes   Current Medications  Current Facility-Administered Medications:  .  0.9 %  sodium chloride infusion, 250 mL, Intravenous, Continuous, Kara Mead V, MD, Last Rate: 10 mL/hr at 01/06/20 1200, Rate Verify at 01/06/20 1200 .  albuterol (PROVENTIL) (2.5 MG/3ML) 0.083% nebulizer solution 2.5 mg, 2.5 mg, Nebulization, Q4H PRN, Arnell Asal, NP .  Chlorhexidine Gluconate Cloth 2 % PADS 6 each, 6 each, Topical, Daily, Jennelle Human B, NP, 6 each at 01/08/20 [phone] .  feeding supplement (ENSURE ENLIVE) (ENSURE ENLIVE) liquid 237 mL, 237 mL, Oral, BID BM, Audria Nine, DO, 237 mL at 01/08/20  2951 Derrill Memo ON 04/17/4165] folic acid (FOLVITE) tablet 1 mg, 1 mg, Oral, Daily, Ghimire, Kuber, MD .  lactulose (CHRONULAC) 10 GM/15ML solution 20 g, 20 g, Oral, Q8H, Ghimire, Kuber, MD, 20 g  at 01/08/20 1122 .  MEDLINE mouth rinse, 15 mL, Mouth Rinse, BID, Audria Nine, DO, 15 mL at 01/08/20 0945 .  midodrine (PROAMATINE) tablet 5 mg, 5 mg, Oral, TID WC, Ghimire, Kuber, MD, 5 mg at 01/08/20 1121 .  multivitamin with minerals tablet 1 tablet, 1 tablet, Oral, Daily, Audria Nine, DO, 1 tablet at 01/08/20 [phone] .  ondansetron (ZOFRAN) tablet 4 mg, 4 mg, Oral, Q6H PRN **OR** ondansetron (ZOFRAN) injection 4 mg, 4 mg, Intravenous, Q6H PRN, Carol Ada, MD, 4 mg at 12/28/19 1009 .  pantoprazole (PROTONIX) EC tablet 40 mg, 40 mg, Oral, Daily, Ghimire, Kuber, MD, 40 mg at 01/08/20 1120 .  [START ON 01/09/2020] prednisoLONE tablet 40 mg, 40 mg, Oral, Daily, Ghimire, Kuber, MD .  rifaximin (XIFAXAN) tablet 550 mg, 550 mg, Oral, BID, Ghimire, Kuber, MD .  sodium chloride flush (NS) 0.9 % injection 10-40 mL, 10-40 mL, Intracatheter, PRN, Carol Ada, MD, 10 mL at 12/29/19 0515 .  sodium chloride flush (NS) 0.9 % injection 3 mL, 3 mL, Intravenous, Q12H, Carol Ada, MD, 3 mL at 01/08/20 0945 .  [START ON 01/09/2020] thiamine tablet 100 mg, 100 mg, Oral, Daily, Ghimire, Kuber, MD .  traZODone (DESYREL) tablet 25 mg, 25 mg, Oral, QHS, Mahan, Kasie J, NP, 25 mg at 01/07/20 2105 .  [START ON 01/09/2020] zinc sulfate capsule 220 mg, 220 mg, Oral, Daily, Ghimire, Kuber, MD  Patients Current Diet:  Diet Order            Diet - low sodium heart healthy        Diet regular Room service appropriate? Yes; Fluid consistency: Thin  Diet effective now              Precautions / Restrictions Precautions Precautions: Fall Precaution Comments: flexiseal Restrictions Weight Bearing Restrictions: No   Has the patient had 2 or more falls or a fall with injury in the past year? 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 Independent  Indoor Mobility: Did the patient need assistance with walking from room to room (with or without device)? Independent  Stairs: Did the patient need assistance with internal or external stairs (with or without device)? Independent  Functional Cognition: Did the patient need help planning regular tasks such as shopping or remembering to take medications? Independent  Home Assistive Devices / Equipment Home Equipment: Environmental consultant - 2 wheels, Cane - single point, Bedside commode, Shower seat - built in(unsure of accuracy)  Prior Device Use: Indicate devices/aids used by the patient prior to current illness, exacerbation or injury? None of the above  Current Functional Level Cognition  Overall Cognitive Status: Impaired/Different from baseline Current Attention Level: Sustained Orientation Level: Oriented X4 Safety/Judgement: Decreased awareness of safety, Decreased awareness of deficits General Comments: pt fixated on her mother to pray; pt easily distracted and needs cues to attend to mobility tasks    Extremity Assessment (includes Sensation/Coordination)  Upper Extremity Assessment: Generalized weakness RUE Deficits / Details: able to complete hand to mouth and touch front forehead; unable to reach back of head due to B  shoulder weakness; PROM WFL; B weak grip strength RUE Coordination: (states having difficulty holding objects) LUE Deficits / Details: similar to R  Lower Extremity Assessment: Generalized weakness    ADLs  Overall ADL's : Needs assistance/impaired Eating/Feeding: Minimal assistance Grooming: Moderate assistance Upper Body Bathing: Moderate assistance, Sitting Lower Body Bathing: Moderate assistance, Sit to/from stand Upper Body Dressing : Moderate assistance, Sitting Lower Body Dressing: Maximal assistance, Sit to/from stand Toilet Transfer: Moderate assistance, +2 for physical assistance, Stand-pivot, Cueing for  safety, Cueing for sequencing Toileting- Clothing Manipulation and Hygiene: Total assistance Toileting - Clothing Manipulation Details (indicate cue type and reason): rectal foley; total assist for pericare adn to manage clothing Functional mobility during ADLs: Moderate assistance, +2 for physical assistance General ADL Comments: Poor awareness of safety during ADL tasks; Continues to say "I can do it, I don't need help"    Mobility  Overal bed mobility: Needs Assistance Bed Mobility: Supine to Sit Supine to sit: Min guard General bed mobility comments: min guard for safety; cues for sequencing; increased time and effort    Transfers  Overall transfer level: Needs assistance Equipment used: Rolling walker (2 wheeled) Transfers: Sit to/from Stand Sit to Stand: Mod assist Stand pivot transfers: Mod assist, +2 physical assistance General transfer comment: cues for hand placement; assistance required to power up into standing and gain balanace upon stand    Ambulation / Gait / Stairs / Wheelchair Mobility  Ambulation/Gait Ambulation/Gait assistance: Min assist, Mod assist, +2 safety/equipment Gait Distance (Feet): 160 Feet Assistive device: Rolling walker (2 wheeled) Gait Pattern/deviations: Step-through pattern, Staggering left, Drifts right/left, Decreased stride length General Gait Details: pt demonstrates carry over of safe use of AD from previous session; assistance required for balance and intermittently to manag RW; pt has tendency for LOB to left and to drift to L side with RW; chair follow for safety Gait velocity: decreased    Posture / Balance Dynamic Sitting Balance Sitting balance - Comments: L lateral lean; pt unaware Balance Overall balance assessment: Needs assistance Sitting-balance support: Feet supported Sitting balance-Leahy Scale: Good Sitting balance - Comments: L lateral lean; pt unaware Standing balance support: Bilateral upper extremity supported, During  functional activity Standing balance-Leahy Scale: Poor Standing balance comment: reliant on external support    Special needs/care consideration Rectal tube to be removed prior to admit as well as external bladder catheter Designated visitors are spouse , Izora Gala and daughter from out of town, [place] Pt's Mom, Derek Jack to be her caregiver after d/c   Previous Home Environment  Living Arrangements: (spouse and 65 year old son, [name])  Lives With: Spouse, Son Available Help at Discharge: Family, Available 24 hours/day Type of Home: House Home Layout: Two level, Able to live on main level with bedroom/bathroom Home Access: Level entry Bathroom Shower/Tub: Walk-in shower(and tub) Bathroom Toilet: Standard Bathroom Accessibility: Yes How Accessible: Accessible via walker Home Care Services: No  Discharge Living Setting Plans for Discharge Living Setting: Patient's home, Lives with (comment)(spouse and 37 year old son, [name]) Type of Home at Discharge: House Discharge Home Layout: Two level, Able to live on main level with bedroom/bathroom Discharge Home Access: Level entry Discharge Bathroom Shower/Tub: Walk-in shower(and stand alone tub) Discharge Bathroom Toilet: Standard Discharge Bathroom Accessibility: Yes How Accessible: Accessible via walker Does the patient have any problems obtaining your medications?: No  Social/Family/Support Systems Patient Roles: Spouse, Parent(LPN) Contact Information: spouse Izora Gala and adult daughter, Rob Bunting Anticipated Caregiver: Pt's 14 year old Mom, Marsh Dolly in town form Doctor, hospital  area to assist with providing 24/7 assist at d/c Anticipated Caregiver's Contact Information: see above Ability/Limitations of Caregiver: Spouse works, Daughter, Futures trader on [employer], pt's Mom to be the caregiver longterm Caregiver Availability: 24/7 Discharge Plan Discussed with Primary Caregiver: Yes Is Caregiver In Agreement with Plan?: Yes Does  Caregiver/Family have Issues with Lodging/Transportation while Pt is in Rehab?: No  Goals Patient/Family Goal for Rehab: Mod I to supervision wiht PT, OT, and SLP Expected length of stay: ELOS 10 to 14 days Pt/Family Agrees to Admission and willing to participate: Yes Program Orientation Provided & Reviewed with Pt/Caregiver Including Roles  & Responsibilities: Yes  Decrease burden of Care through IP rehab admission: n/a  Possible need for SNF placement upon discharge: not anticipated  Patient Condition: I have reviewed medical records from [facility]  spoken with CM, and patient and daughter. I met with patient at the bedside for inpatient rehabilitation assessment.  Patient will benefit from ongoing PT, OT and SLP, can actively participate in 3 hours of therapy a day 5 days of the week, and can make measurable gains during the admission.  Patient will also benefit from the coordinated team approach during an Inpatient Acute Rehabilitation admission.  The patient will receive intensive therapy as well as Rehabilitation physician, nursing, social worker, and care management interventions.  Due to bladder management, bowel management, safety, skin/wound care, disease management, medication administration, pain management and patient education the patient requires 24 hour a day rehabilitation nursing.  The patient is currently mod assist overall with mobility and basic ADLs.  Discharge setting and therapy post discharge at home with home health is anticipated.  Patient has agreed to participate in the Acute Inpatient Rehabilitation Program and will admit today.  Preadmission Screen Completed By:  Cleatrice Burke, 01/08/2020 12:09 PM ______________________________________________________________________   Discussed status with Dr. Posey Pronto  on  01/08/2020 at 1220 and received approval for admission today.  Admission Coordinator:  Cleatrice Burke, RN, time  [id number] Date  01/08/2020    Assessment/Plan: Diagnosis: Encephalopathy 1. Does the need for close, 24 hr/day Medical supervision in concert with the patient's rehab needs make it unreasonable for this patient to be served in a less intensive setting? Yes 2. Co-Morbidities requiring supervision/potential complications:  HTN (monitor and provide prns in accordance with increased physical exertion and pain), alcohol dependence (counsel when appropriate), history of alcoholic pancreatitis 3. Due to bladder management, safety, skin/wound care, disease management, medication administration and patient education, does the patient require 24 hr/day rehab nursing? Yes 4. Does the patient require coordinated care of a physician, rehab nurse, PT, OT, and SLP to address physical and functional deficits in the context of the above medical diagnosis(es)? Yes Addressing deficits in the following areas: balance, endurance, locomotion, strength, transferring, bathing, dressing, toileting, cognition and psychosocial support 5. Can the patient actively participate in an intensive therapy program of at least 3 hrs of therapy 5 days a week? Yes 6. The potential for patient to make measurable gains while on inpatient rehab is excellent 7. Anticipated functional outcomes upon discharge from inpatient rehab: supervision PT, supervision OT, supervision SLP 8. Estimated rehab length of stay to reach the above functional goals is: 14-17 days. 9. Anticipated discharge destination: Home 10. Overall Rehab/Functional Prognosis: good   MD Signature: Delice Lesch, MD, ABPMR

## 2020-01-09 ENCOUNTER — Inpatient Hospital Stay (HOSPITAL_COMMUNITY): Payer: Medicaid Other | Admitting: Physical Therapy

## 2020-01-09 ENCOUNTER — Inpatient Hospital Stay (HOSPITAL_COMMUNITY): Payer: Medicaid Other | Admitting: Speech Pathology

## 2020-01-09 ENCOUNTER — Inpatient Hospital Stay (HOSPITAL_COMMUNITY): Payer: Medicaid Other | Admitting: Occupational Therapy

## 2020-01-09 DIAGNOSIS — R739 Hyperglycemia, unspecified: Secondary | ICD-10-CM

## 2020-01-09 DIAGNOSIS — K7290 Hepatic failure, unspecified without coma: Secondary | ICD-10-CM

## 2020-01-09 DIAGNOSIS — E512 Wernicke's encephalopathy: Secondary | ICD-10-CM

## 2020-01-09 DIAGNOSIS — D72828 Other elevated white blood cell count: Secondary | ICD-10-CM

## 2020-01-09 DIAGNOSIS — D539 Nutritional anemia, unspecified: Secondary | ICD-10-CM

## 2020-01-09 DIAGNOSIS — D696 Thrombocytopenia, unspecified: Secondary | ICD-10-CM

## 2020-01-09 DIAGNOSIS — I951 Orthostatic hypotension: Secondary | ICD-10-CM

## 2020-01-09 DIAGNOSIS — T380X5A Adverse effect of glucocorticoids and synthetic analogues, initial encounter: Secondary | ICD-10-CM

## 2020-01-09 DIAGNOSIS — D72829 Elevated white blood cell count, unspecified: Secondary | ICD-10-CM

## 2020-01-09 DIAGNOSIS — R7401 Elevation of levels of liver transaminase levels: Secondary | ICD-10-CM

## 2020-01-09 MED ORDER — SODIUM CHLORIDE 0.9% FLUSH
10.0000 mL | Freq: Two times a day (BID) | INTRAVENOUS | Status: DC
  Administered 2020-01-09 – 2020-01-10 (×4): 10 mL
  Administered 2020-01-11: 15 mL
  Administered 2020-01-12: 10 mL
  Administered 2020-01-12: 15 mL

## 2020-01-09 MED ORDER — SODIUM CHLORIDE 0.9% FLUSH: 10.0000 mL | INTRAVENOUS | Status: DC | PRN

## 2020-01-09 NOTE — Evaluation (Signed)
Physical Therapy Assessment and Plan  Patient Details  Name: Joann [name] MRN: [id number] Date of Birth: [birth year]  PT Diagnosis: Abnormal posture, Abnormality of gait, Coordination disorder and Muscle weakness Rehab Potential: Good ELOS: 7-10 days   Today's Date: 01/09/2020 PT Individual Time: 1300-1400 PT Individual Time Calculation (min): 60 min    Problem List:  Patient Active Problem List   Diagnosis Date Noted  . Wernicke's encephalopathy   . Transaminitis   . Steroid-induced hyperglycemia   . Leucocytosis   . Orthostasis   . Macrocytic anemia   . AMS (altered mental status)   . Sleep disturbance   . Pressure injury of skin 01/05/2020  . Alcoholic hepatitis with ascites   . Acute respiratory failure ([place])   . Palliative care encounter   . Elevated bilirubin   . Ascites   . Advanced care planning/counseling discussion   . Goals of care, counseling/discussion   . Palliative care by specialist   . Hypoalbuminemia   . Hepatic encephalopathy ([place]) 12/24/2019  . Alcoholic hepatitis without ascites   . Hypokalemia due to inadequate potassium intake 04/02/2019  . Alcohol withdrawal ([place]) 10/24/2018  . Alcohol dependence with withdrawal, unspecified ([place]) 10/24/2018  . Elevated LFTs 10/24/2018  . Thrombocytopenia ([place]) 10/24/2018  . Alcohol-induced acute pancreatitis   . Acute blood loss anemia   . Alcoholic ketoacidosis 97/41/6384  . Alcohol abuse with alcohol-induced mood disorder ([place]) 09/14/2017  . Elevated liver enzymes 04/29/2017    Past Medical History:  Past Medical History:  Diagnosis Date  . Alcohol abuse   . Anxiety   . Hypertension   . Liver disease    Past Surgical History:  Past Surgical History:  Procedure Laterality Date  . ECTOPIC PREGNANCY SURGERY    . ESOPHAGOGASTRODUODENOSCOPY (EGD) WITH PROPOFOL N/A 12/27/2019   Procedure: ESOPHAGOGASTRODUODENOSCOPY (EGD) WITH PROPOFOL;  Surgeon: Carol Ada, MD;  Location: [facility];  Service:  Endoscopy;  Laterality: N/A;    Assessment & Plan Clinical Impression: Patient is a 39 year old right-handed female with history of hypertension, alcohol dependence with history of alcoholic pancreatitis seen by [facility] in the past. She reportedly has failed rehab multiple times. History taken from chart review and daughter due to cognition. Patient lives with spouse and children. Independent prior to admission. Two-level home bed and bath on main level. She presented on 12/23/2019 with AMS. Cranial CT unremarkable for acute intracranial process. Ultrasound the abdomen showed hepatic steatosis with subtle nodularity of the liver capsule reflecting cirrhosis. Minimal abdominal ascites most prominent in the right upper quadrant. Admission chemistry sodium 128, potassium 2.3, creatinine 1.07, chloride 89, total bilirubin 26.6, AST 143, ammonia 137, alkaline phos 197. Alcohol level of 13, hemoglobin 9.9, platelets 137,000. She did require initial intubation for airway protection. Gastroenterology services consulted for anemia positive guaiac stool and underwent GI endoscopy 12/27/2019 which was unremarkable for bleed. Hospital course further complicated by thrombocytopenia, requiring 1 unit of fresh frozen plasma for coagulopathy thrombocytopenia/anemia with latest platelets 53,000 and hemoglobin 7.0 secondary to alcohol hepatitis and chronic liver disease. Leukocytosis 16,900 felt to recent steroid-induced. Maintained on Xifixan for her chronic liver disease as well as course of prednisolone x28 days for her acute alcoholic hepatitis. Latest chemistries much improved sodium 138, potassium 3.8 ammonia level currently 56 she remains on Chronulac. Bouts of orthostasis currently maintained on ProAmatine. Palliative care was consulted to establish goals of care. Tolerating a regular diet. Therapy evaluations completed and patient was admitted for a comprehensive rehab program. Patient transferred  to CIR on 01/08/2020 .   Patient currently requires mod with mobility secondary to muscle weakness and muscle joint tightness, decreased cardiorespiratoy endurance, ataxia and decreased coordination, decreased attention, decreased awareness, decreased problem solving, decreased safety awareness, decreased memory and delayed processing and decreased sitting balance, decreased standing balance, decreased postural control and decreased balance strategies.  Prior to hospitalization, patient was modified independent  with mobility and lived with Spouse, Son in a House home.  Home access is  Level entry.  Patient will benefit from skilled PT intervention to maximize safe functional mobility, minimize fall risk and decrease caregiver burden for planned discharge home with 24 hour supervision.  Anticipate patient will benefit from follow up [place] at discharge.  PT - End of Session Activity Tolerance: Tolerates 10 - 20 min activity with multiple rests Endurance Deficit: Yes PT Assessment Rehab Potential (ACUTE/IP ONLY): Good PT Barriers to Discharge: [place] home environment;Decreased caregiver support;Medical stability;Home environment access/layout;Insurance for SNF coverage;Wound Care;Lack of/limited family support;Medication compliance;Behavior;Nutrition means PT Patient demonstrates impairments in the following area(s): Balance;Behavior;Edema;Endurance;Motor;Nutrition;Pain;Perception;Safety;Skin Integrity PT Transfers Functional Problem(s): Bed Mobility;Furniture;Car;Floor;Bed to Chair PT Locomotion Functional Problem(s): Ambulation;Wheelchair Mobility;Stairs;Other (comment) PT Plan PT Intensity: Minimum of 1-2 x/day ,45 to 90 minutes PT Frequency: 5 out of 7 days PT Duration Estimated Length of Stay: 7-10 days PT Treatment/Interventions: Ambulation/gait training;Discharge planning;Functional mobility training;Psychosocial support;Therapeutic Activities;Visual/perceptual  remediation/compensation;Wheelchair propulsion/positioning;Therapeutic Exercise;Skin care/wound management;Neuromuscular re-education;Disease management/prevention;Balance/vestibular training;Cognitive remediation/compensation;DME/adaptive equipment instruction;Pain management;Splinting/orthotics;UE/LE Strength taining/ROM;Community reintegration;Functional electrical stimulation;Patient/family education;Stair training;UE/LE Coordination activities PT Transfers Anticipated Outcome(s): supervision assist with LRAD PT Locomotion Anticipated Outcome(s): Ambulatory with superivison assist and LRAD PT Recommendation Recommendations for Other Services: Therapeutic Recreation consult;Neuropsych consult Therapeutic Recreation Interventions: Clinical cytogeneticist;Outing/community reintergration Follow Up Recommendations: Home health PT Patient destination: Home Equipment Recommended: To be determined  Skilled Therapeutic Intervention Pt received sitting in WC and agreeable to PT. PT instructed patient in PT Evaluation and initiated treatment intervention; see below for results. PT educated patient in [place], rehab potential, rehab goals, and discharge recommendations. Pt initially required mod assist for sit<>stand and stand pivot transfers, but improved to min assist throughout treatment with increased repetitions Pt performed 5xSTS: 29 sec (>15 sec indicates increased fall risk). Pt unable to performed stair management without UE support. Once UE support given, pt was able to ascend/descend 4 steps with mod assist for safety. Gait traning with min assist x 113f and occasional mod assist in turns with no AD> car transfer performed with mod assist for safety and to prevent posterior LOB. Patient returned to room and [facility] and all needs met.       PT Evaluation Precautions/Restrictions Precautions Precautions: Fall Pain   denies Home Living/Prior  Functioning Home Living Available Help at Discharge: Family Type of Home: House Home Access: Level entry Home Layout: Two level;Able to live on main level with bedroom/bathroom Bathroom Shower/Tub: Walk-in shower;Tub/shower unit Bathroom Toilet: Standard Bathroom Accessibility: Yes Additional Comments: Information regarding pts bathroom layout provided [place] pts [place], [place]: Spouse;Family(two teenaged children) Prior Function Level of Independence: Independent with basic ADLs;Independent with homemaking with ambulation Driving: Yes Vision/Perception  Perception Perception: Within Functional Limits Praxis Praxis: Impaired Praxis Impairment Details: Perseveration  Cognition Arousal/Alertness: Awake/alert Attention: Sustained Sustained Attention: Impaired Memory: Impaired Immediate Memory Recall: Sock;[place];Bed Memory Recall Sock: Without Cue Memory Recall [place]: Without Cue Memory Recall Bed: Without Cue Problem Solving: Impaired Behaviors: Impulsive Safety/Judgment: Impaired Sensation Sensation Light Touch: Appears Intact Proprioception: Appears Intact Coordination Gross Motor Movements  are Fluid and Coordinated: No Fine Motor Movements are Fluid and Coordinated: No Coordination and Movement Description: mild ataxia. BUE/BLE Finger Nose Finger Test: decreased speed Bil, L>R Heel Shin Test: decreased ROM BLE, mild ataxia LLE Motor  Motor Motor: Ataxia;Other (comment) Motor - Skilled Clinical Observations: mild ataxia. generalized weakness  Mobility Bed Mobility Bed Mobility: Rolling Right;Rolling Left;Sit to Supine;Supine to Sit Rolling Right: Minimal Assistance - Patient > 75% Rolling Left: Minimal Assistance - Patient > 75% Supine to Sit: Minimal Assistance - Patient > 75% Sit to Supine: Minimal Assistance - Patient > 75% Transfers Transfers: Sit to [employer] Transfers Sit to Stand: Moderate Assistance - Patient 50-74% Stand Pivot  Transfers: Moderate Assistance - Patient 50 - 74% Transfer (Assistive device): None Locomotion  Gait Ambulation: Yes Gait Assistance: Minimal Assistance - Patient > 75%;Moderate Assistance - Patient 50-74% Gait Distance (Feet): 120 Feet Assistive device: None Gait Gait: Yes Gait Pattern: Ataxic;Poor foot clearance - left Stairs / Additional Locomotion Stairs: No(unable to perform without UE SUpport) Ramp: Moderate Assistance - Patient 50 - 74% Wheelchair Mobility Wheelchair Mobility: Yes Wheelchair Assistance: Minimal assistance - Patient >75% Wheelchair Propulsion: Both upper extremities Wheelchair Parts Management: Needs assistance Distance: 150  Trunk/Postural Assessment  Cervical Assessment Cervical Assessment: Within Functional Limits Thoracic Assessment Thoracic Assessment: Within Functional Limits Lumbar Assessment Lumbar Assessment: Within Functional Limits Postural Control Postural Control: Deficits on evaluation Protective Responses: delayed posteriorly in standing Postural Limitations: posterior bias  Balance Balance Balance Assessed: Yes Standardized Balance Assessment Standardized Balance Assessment: Timed Up and Go Test Static Sitting Balance Static Sitting - Balance Support: No upper extremity supported Static Sitting - Level of Assistance: 6: Modified independent (Device/Increase time) Dynamic Sitting Balance Dynamic Sitting - Balance Support: No upper extremity supported Dynamic Sitting - Level of Assistance: 5: Stand by assistance Static Standing Balance Static Standing - Balance Support: No upper extremity supported Static Standing - Level of Assistance: 4: Min assist Dynamic Standing Balance Dynamic Standing - Balance Support: No upper extremity supported Dynamic Standing - Level of Assistance: 3: Mod assist Extremity Assessment      RLE Assessment RLE Assessment: Exceptions to [facility] General Strength Comments: grossly 4-/5 to 4/5 proximal to  distal LLE Assessment LLE Assessment: Exceptions to [facility] General Strength Comments: grossly 4-/5 to 4/5 proximal to distal    Refer to Care Plan for Long Term Goals  Recommendations for other services: Neuropsych and Surveyor, mining group, Stress management and Outing/community reintegration  Discharge Criteria: Patient will be discharged from PT if patient refuses treatment 3 consecutive times without medical reason, if treatment goals not met, if there is a change in medical status, if patient makes no progress towards goals or if patient is discharged from hospital.  The above assessment, treatment plan, treatment alternatives and goals were discussed and mutually agreed upon: by patient  Lorie Phenix 01/09/2020, 2:06 PM

## 2020-01-09 NOTE — Plan of Care (Signed)
  Problem: RH Balance Goal: LTG Patient will maintain dynamic standing with ADLs (OT) Description: LTG:  Patient will maintain dynamic standing balance with assist during activities of daily living (OT)  Flowsheets (Taken 01/09/2020 1629) LTG: Pt will maintain dynamic standing balance during ADLs with: Supervision/Verbal cueing   Problem: Sit to Stand Goal: LTG:  Patient will perform sit to stand in prep for activites of daily living with assistance level (OT) Description: LTG:  Patient will perform sit to stand in prep for activites of daily living with assistance level (OT) Flowsheets (Taken 01/09/2020 1629) LTG: PT will perform sit to stand in prep for activites of daily living with assistance level: Supervision/Verbal cueing   Problem: RH Bathing Goal: LTG Patient will bathe all body parts with assist levels (OT) Description: LTG: Patient will bathe all body parts with assist levels (OT) Flowsheets (Taken 01/09/2020 1629) LTG: Pt will perform bathing with assistance level/cueing: Supervision/Verbal cueing   Problem: RH Dressing Goal: LTG Patient will perform upper body dressing (OT) Description: LTG Patient will perform upper body dressing with assist, with/without cues (OT). Flowsheets (Taken 01/09/2020 1629) LTG: Pt will perform upper body dressing with assistance level of: Supervision/Verbal cueing Goal: LTG Patient will perform lower body dressing w/assist (OT) Description: LTG: Patient will perform lower body dressing with assist, with/without cues in positioning using equipment (OT) Flowsheets (Taken 01/09/2020 1629) LTG: Pt will perform lower body dressing with assistance level of: (not including Teds)  Supervision/Verbal cueing  Other (Comment) Note: Not including Teds   Problem: RH Toileting Goal: LTG Patient will perform toileting task (3/3 steps) with assistance level (OT) Description: LTG: Patient will perform toileting task (3/3 steps) with assistance level (OT)  Flowsheets  (Taken 01/09/2020 1629) LTG: Pt will perform toileting task (3/3 steps) with assistance level: Supervision/Verbal cueing   Problem: RH Toilet Transfers Goal: LTG Patient will perform toilet transfers w/assist (OT) Description: LTG: Patient will perform toilet transfers with assist, with/without cues using equipment (OT) Flowsheets (Taken 01/09/2020 1629) LTG: Pt will perform toilet transfers with assistance level of: Supervision/Verbal cueing   Problem: RH Tub/Shower Transfers Goal: LTG Patient will perform tub/shower transfers w/assist (OT) Description: LTG: Patient will perform tub/shower transfers with assist, with/without cues using equipment (OT) Flowsheets (Taken 01/09/2020 1629) LTG: Pt will perform tub/shower stall transfers with assistance level of: Supervision/Verbal cueing

## 2020-01-09 NOTE — Evaluation (Signed)
Speech Language Pathology Assessment and Plan  Patient Details  Name: Joann [name] MRN: [id number] Date of Birth: 03-01-1981  SLP Diagnosis: Cognitive Impairments  Rehab Potential: Good ELOS: 10-14 days    Today's Date: 01/09/2020 SLP Individual Time: 7371-0626 SLP Individual Time Calculation (min): 60 min   Problem List:  Patient Active Problem List   Diagnosis Date Noted  . Orthostasis   . Macrocytic anemia   . AMS (altered mental status)   . Sleep disturbance   . Pressure injury of skin 01/05/2020  . Alcoholic hepatitis with ascites   . Acute respiratory failure ([place])   . Palliative care encounter   . Elevated bilirubin   . Ascites   . Advanced care planning/counseling discussion   . Goals of care, counseling/discussion   . Palliative care by specialist   . Hypoalbuminemia   . Hepatic encephalopathy ([place]) 12/24/2019  . Alcoholic hepatitis without ascites   . Hypokalemia due to inadequate potassium intake 04/02/2019  . Alcohol withdrawal ([place]) 10/24/2018  . Alcohol dependence with withdrawal, unspecified ([place]) 10/24/2018  . Elevated LFTs 10/24/2018  . Thrombocytopenia ([place]) 10/24/2018  . Alcohol-induced acute pancreatitis   . Acute blood loss anemia   . Alcoholic ketoacidosis 94/85/4627  . Alcohol abuse with alcohol-induced mood disorder ([place]) 09/14/2017  . Elevated liver enzymes 04/29/2017   Past Medical History:  Past Medical History:  Diagnosis Date  . Alcohol abuse   . Anxiety   . Hypertension   . Liver disease    Past Surgical History:  Past Surgical History:  Procedure Laterality Date  . ECTOPIC PREGNANCY SURGERY    . ESOPHAGOGASTRODUODENOSCOPY (EGD) WITH PROPOFOL N/A 12/27/2019   Procedure: ESOPHAGOGASTRODUODENOSCOPY (EGD) WITH PROPOFOL;  Surgeon: Carol Ada, MD;  Location: [facility];  Service: Endoscopy;  Laterality: N/A;    Assessment / Plan / Recommendation Clinical Impression HPI: Joann [name] is a 39 year old right-handed female with  history of hypertension, alcohol dependence with history of alcoholic pancreatitis seen by [facility]ology in the past.  She reportedly has failed rehab multiple times. History taken from chart review and daughter due to cognition. Patient lives with spouse and children.  Independent prior to admission.  Two-level home bed and bath on main level. She presented on 12/23/2019 with AMS. Cranial CT unremarkable for acute intracranial process. Ultrasound the abdomen showed hepatic steatosis with subtle nodularity of the liver capsule reflecting cirrhosis.  Minimal abdominal ascites most prominent in the right upper quadrant.  Admission chemistry sodium 128, potassium 2.3, creatinine 1.07, chloride 89, total bilirubin 26.6, AST 143, ammonia 137, alkaline phos 197.  Alcohol level of 13, hemoglobin 9.9, platelets 137,000.  She did require initial intubation for airway protection.  Gastroenterology services consulted for anemia positive guaiac stool and underwent GI endoscopy 12/27/2019 which was unremarkable for bleed. Hospital course further complicated by thrombocytopenia, requiring 1 unit of fresh frozen plasma for coagulopathy thrombocytopenia/anemia with latest platelets 53,000 and hemoglobin 7.0 secondary to alcohol hepatitis and chronic liver disease.  Leukocytosis 16,900 felt to recent steroid-induced.  Maintained on Xifixan for her chronic liver disease as well as course of prednisolone x28 days for her acute alcoholic hepatitis.  Latest chemistries much improved sodium 138, potassium 3.8 ammonia level currently 56 she remains on Chronulac.  Bouts of orthostasis currently maintained on ProAmatine.  Palliative care was consulted to establish goals of care.  Tolerating a regular diet.  Therapy evaluations completed and patient was admitted for a comprehensive rehab program. Please see preadmission assessment from earlier today as  well.  Clinical swallow evaluation: Patient presents with a functional oral phase of  swallowing and no s/sx of pharyngeal dysphagia. Patient demonstrated functional mastication of a regular solid with no oral residues noted. No overt s/sx of aspiration were observed with thin liquids, pure, or solid consistency. Recommend continue a regular diet and thin liquids. No further skilled SLP services warranted to address swallowing.  Cognitive-linguistic evaluation: Patient presents with cognitive deficits in the areas of: memory, attention, insight, problem solving, and reasoning. Patient participated in the [place] with the following scores: orientation 12/12 average range, attention 4/8 mild-moderate impairment, registration 6 severe impairment, comprehension 6/6 average range, repetition 12/12 average range, naming 8/8 average range, memory 8/12 mild impairment, calculations 1/4 moderate impairment, similarities 5/8 average range, judgement 2/6 moderate impairment. Patient required frequent redirection to therapy tasks during evaluation due to reduced attention. Decreased safety awareness and insight were noted with patient attempting to get up during evaluation to respond to call bells. Patient would benefit from skilled SLP services during CIR stay targeting cognition to increase functional independence upon discharge home.   Skilled Therapeutic Interventions          Clinical swallow evaluation and Cognitive-linguistic evaluation  SLP Assessment  Patient will need skilled Speech Lanaguage Pathology Services during CIR admission    Recommendations  SLP Diet Recommendations: Thin Liquid Administration via: Cup;Straw Medication Administration: Whole meds with liquid Supervision: Patient able to self feed Postural Changes and/or Swallow Maneuvers: Seated upright 90 degrees Recommendations for Other Services: Neuropsych consult Patient destination: Home Follow up Recommendations: Home Health SLP Equipment Recommended: None recommended by SLP    SLP Frequency 3 to 5 out of 7 days    SLP Duration  SLP Intensity  SLP Treatment/Interventions 10-14 days  Minumum of 1-2 x/day, 30 to 90 minutes  Cognitive remediation/compensation;Cueing hierarchy;Functional tasks;Therapeutic Activities;Internal/external aids;Medication managment;Patient/family education    Pain Pain Assessment Pain Scale: 0-10 Pain Score: 0-No pain  Prior Functioning Cognitive/Linguistic Baseline: Information not available  Lives With: Spouse;Son Available Help at Discharge: Family Vocation: (LPN)  SLP Evaluation Cognition Overall Cognitive Status: No family/caregiver present to determine baseline cognitive functioning Arousal/Alertness: Awake/alert Orientation Level: Oriented X4 Attention: Sustained Sustained Attention: Impaired Memory: Impaired Memory Impairment: Decreased short term memory Awareness: Impaired Awareness Impairment: Emergent impairment Problem Solving: Impaired Problem Solving Impairment: Verbal basic Executive Function: Reasoning Reasoning: Impaired Behaviors: Lability Safety/Judgment: Impaired  Comprehension Auditory Comprehension Overall Auditory Comprehension: Appears within functional limits for tasks assessed Yes/No Questions: Within Functional Limits Commands: Within Functional Limits Visual Recognition/Discrimination Discrimination: Not tested Reading Comprehension Reading Status: Not tested Expression Expression Primary Mode of Expression: Verbal Verbal Expression Overall Verbal Expression: Appears within functional limits for tasks assessed Initiation: No impairment Level of Generative/Spontaneous Verbalization: Conversation Repetition: No impairment Naming: No impairment Written Expression Dominant Hand: Right Written Expression: Not tested Oral Motor Oral Motor/Sensory Function Overall Oral Motor/Sensory Function: Within functional limits Motor Speech Overall Motor Speech: Appears within functional limits for tasks assessed  Bedside  Swallowing Assessment General Diet Prior to this Study: Regular;Thin liquids Temperature Spikes Noted: No Respiratory Status: Room air Behavior/Cognition: Alert;Cooperative;Pleasant mood Oral Cavity - Dentition: Adequate natural dentition Self-Feeding Abilities: Able to feed self;Needs set up Vision: Functional for self-feeding Patient Positioning: Upright in bed Baseline Vocal Quality: Normal  Thin Liquid Thin Liquid: Within functional limits Presentation: Straw;Cup;Self Fed Puree Puree: Within functional limits Solid Solid: Within functional limits BSE Assessment Risk for Aspiration Impact on safety and function: No limitations  Short Term Goals: Week 1: SLP Short  Term Goal 1 (Week 1): Patient will utilize external aids as trained and implemented in therapy to enhance functional cognitive-linguistic skills with 80% accuracy and mod cues. SLP Short Term Goal 2 (Week 1): Patient will demonstrate sustained attention to task for 15 minutes with mod verbal cues for redirection. SLP Short Term Goal 3 (Week 1): Patient will complete delayed recall tasks for 5 stimuli post ~5 minute delay with 80% accuracy and mod cues. SLP Short Term Goal 4 (Week 1): Patient will complete functional problem solving scenarios with 80% accuracy and mod cues.  Refer to Care Plan for Long Term Goals  Recommendations for other services: Neuropsych  Discharge Criteria: Patient will be discharged from SLP if patient refuses treatment 3 consecutive times without medical reason, if treatment goals not met, if there is a change in medical status, if patient makes no progress towards goals or if patient is discharged from hospital.  The above assessment, treatment plan, treatment alternatives and goals were discussed and mutually agreed upon: by patient  Cristy Folks 01/09/2020, 8:14 AM

## 2020-01-09 NOTE — Progress Notes (Signed)
Pt confused wanting to go to work. Husband at bedside.Wanted to switch ppl coming to visit on regular basis. Informed him that only allow 2 ppl the entire length of stay. Michela Pitcher that needs someone to go with her to therapy. Oriented him to rehab and conference to take place on Tuesday. Informed him that family ed will be provided at a scheduled time when closer to discharge. Pt left supine in bed with call bell in reach and bed alarm on and functioning.

## 2020-01-09 NOTE — Progress Notes (Signed)
Pt refused nightly medications other than trazadone. Educated pt on the indications for medication. 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

## 2020-01-09 NOTE — Progress Notes (Signed)
[facility] PHYSICAL MEDICINE & REHABILITATION PROGRESS NOTE  Subjective/Complaints: Patient seen sitting up in bed this morning, eating breakfast.  She states she slept well overnight.  She refused her medications except for trazodone overnight.  She indicates she is ready to begin therapies.  ROS: Limited due to cognition  Objective: Vital Signs: Blood pressure (!) 147/65, pulse 73, temperature 98.2 F (36.8 C), resp. rate 18, weight 72.9 kg, SpO2 96 %. No results found. Recent Labs    01/07/20 0534 01/08/20 0426  WBC 18.6* 16.9*  HGB 7.7* 7.0*  HCT 25.4* 22.8*  PLT 51* 53*   Recent Labs    01/07/20 0534 01/08/20 0426  NA 139 138  K 3.6 3.8  CL 109 108  CO2 23 22  GLUCOSE 96 121*  BUN 8 6  CREATININE 0.56 0.64  CALCIUM 8.6* 8.6*    Physical Exam: BP (!) 147/65 (BP Location: Right Arm)   Pulse 73   Temp 98.2 F (36.8 C)   Resp 18   Wt 72.9 kg   SpO2 96%   BMI 27.59 kg/m  Constitutional: No distress . Vital signs reviewed. HENT: Normocephalic.  Atraumatic. Eyes: EOMI. No discharge.  Sclera icterus. Cardiovascular: No JVD. Respiratory: Normal effort.  No stridor. GI: Non-distended. Skin: Warm and dry.  Intact. Psych: Labile mood.  Confused. Musc: No edema in extremities.  No tenderness in extremities. Neurological: Alert Makes good eye contact with examiner.   Decreased awareness of her deficits. Motor: Grossly 4-4+/5 throughout   Assessment/Plan: 1. Functional deficits secondary to encephalopathy which require 3+ hours per day of interdisciplinary therapy in a comprehensive inpatient rehab setting.  Physiatrist is providing close team supervision and 24 hour management of active medical problems listed below.  Physiatrist and rehab team continue to assess barriers to discharge/monitor patient progress toward functional and medical goals  Care Tool:  Bathing              Bathing assist       Upper Body Dressing/Undressing Upper body  dressing   What is the patient wearing?: Hospital gown only    Upper body assist Assist Level: Minimal Assistance - Patient > 75%    Lower Body Dressing/Undressing Lower body dressing      What is the patient wearing?: Incontinence brief     Lower body assist Assist for lower body dressing: Moderate Assistance - Patient 50 - 74%     Toileting Toileting    Toileting assist Assist for toileting: Moderate Assistance - Patient 50 - 74%     Transfers Chair/bed transfer  Transfers assist           Locomotion Ambulation   Ambulation assist              Walk 10 feet activity   Assist           Walk 50 feet activity   Assist           Walk 150 feet activity   Assist           Walk 10 feet on uneven surface  activity   Assist           Wheelchair     Assist               Wheelchair 50 feet with 2 turns activity    Assist            Wheelchair 150 feet activity     Assist  Medical Problem List and Plan: 1.  Decreased functional mobility with altered mental status secondary to Wernicke's versus hepatic encephalopathy related to alcohol abuse.  Continue lactulose as directed as well as prednisolone x28 days total per GI services as well as Xifaxan for chronic liver disease  Begin CIR evaluations 2.  Antithrombotics: -DVT/anticoagulation: SCDs             -antiplatelet therapy: N/A 3. Pain Management: Tylenol 4. Mood: Trazodone 25 mg nightly             -antipsychotic agents: N/A 5. Neuropsych: This patient is not capable of making decisions on her own behalf. 6. Skin/Wound Care: Routine skin checks 7. Fluids/Electrolytes/Nutrition: Routine in and outs.  8.  Coagulopathy/thrombocytopenia/chronic macrocytic anemia/leukocytosis.  Leukocytosis felt to be related to prednisolone.  Follow-up chemistries.  Transfuse if symptomatic             Hemoglobin 7.0 on 4/30, labs ordered for  Monday  Platelets 53 on 4/30, labs ordered for Monday  WBCs 16.9 on 4/30, labs ordered for Monday 9.  Orthostasis.  ProAmatine 3 times daily.  Monitor with increased mobility 10.  Decreased nutritional storage.  Follow-up dietary services             Supplementation 11.  Steroid-induced hyperglycemia  Monitor with increased mobility  12.  Transaminitis  LFTs elevated on 4/30, labs ordered for Monday  LOS: 1 days A FACE TO FACE EVALUATION WAS PERFORMED  Jabree Rebert Lorie Phenix 01/09/2020, 9:32 AM

## 2020-01-09 NOTE — Evaluation (Signed)
Occupational Therapy Assessment and Plan  Patient Details  Name: Joann [name] MRN: [id number] Date of Birth: 1980/10/04  OT Diagnosis: cognitive deficits and muscle weakness (generalized) Rehab Potential: Rehab Potential (ACUTE ONLY): Good ELOS: 10-12 days   Today's Date: 01/09/2020 OT Individual Time: 0845-1000 OT Individual Time Calculation (min): 75 min     Problem List:  Patient Active Problem List   Diagnosis Date Noted  . Wernicke's encephalopathy   . Transaminitis   . Steroid-induced hyperglycemia   . Leucocytosis   . Orthostasis   . Macrocytic anemia   . AMS (altered mental status)   . Sleep disturbance   . Pressure injury of skin 01/05/2020  . Alcoholic hepatitis with ascites   . Acute respiratory failure ([place])   . Palliative care encounter   . Elevated bilirubin   . Ascites   . Advanced care planning/counseling discussion   . Goals of care, counseling/discussion   . Palliative care by specialist   . Hypoalbuminemia   . Hepatic encephalopathy ([place]) 12/24/2019  . Alcoholic hepatitis without ascites   . Hypokalemia due to inadequate potassium intake 04/02/2019  . Alcohol withdrawal ([place]) 10/24/2018  . Alcohol dependence with withdrawal, unspecified ([place]) 10/24/2018  . Elevated LFTs 10/24/2018  . Thrombocytopenia ([place]) 10/24/2018  . Alcohol-induced acute pancreatitis   . Acute blood loss anemia   . Alcoholic ketoacidosis 81/85/6314  . Alcohol abuse with alcohol-induced mood disorder ([place]) 09/14/2017  . Elevated liver enzymes 04/29/2017    Past Medical History:  Past Medical History:  Diagnosis Date  . Alcohol abuse   . Anxiety   . Hypertension   . Liver disease    Past Surgical History:  Past Surgical History:  Procedure Laterality Date  . ECTOPIC PREGNANCY SURGERY    . ESOPHAGOGASTRODUODENOSCOPY (EGD) WITH PROPOFOL N/A 12/27/2019   Procedure: ESOPHAGOGASTRODUODENOSCOPY (EGD) WITH PROPOFOL;  Surgeon: Carol Ada, MD;  Location: [facility];  Service:  Endoscopy;  Laterality: N/A;    Assessment & Plan  Clinical Impression:Joann [name] is a 39 year old right-handed female with history of hypertension, alcohol dependence with history of alcoholic pancreatitis seen by [facility]ology in the past.  She reportedly has failed rehab multiple times. History taken from chart review and daughter due to cognition. Patient lives with spouse and children.  Independent prior to admission.  Two-level home bed and bath on main level. She presented on 12/23/2019 with AMS. Cranial CT unremarkable for acute intracranial process. Ultrasound the abdomen showed hepatic steatosis with subtle nodularity of the liver capsule reflecting cirrhosis.  Minimal abdominal ascites most prominent in the right upper quadrant.  Admission chemistry sodium 128, potassium 2.3, creatinine 1.07, chloride 89, total bilirubin 26.6, AST 143, ammonia 137, alkaline phos 197.  Alcohol level of 13, hemoglobin 9.9, platelets 137,000.  She did require initial intubation for airway protection.  Gastroenterology services consulted for anemia positive guaiac stool and underwent GI endoscopy 12/27/2019 which was unremarkable for bleed. Hospital course further complicated by thrombocytopenia, requiring 1 unit of fresh frozen plasma for coagulopathy thrombocytopenia/anemia with latest platelets 53,000 and hemoglobin 7.0 secondary to alcohol hepatitis and chronic liver disease.  Leukocytosis 16,900 felt to recent steroid-induced.  Maintained on Xifixan for her chronic liver disease as well as course of prednisolone x28 days for her acute alcoholic hepatitis.  Latest chemistries much improved sodium 138, potassium 3.8 ammonia level currently 56 she remains on Chronulac.  Bouts of orthostasis currently maintained on ProAmatine.  Palliative care was consulted to establish goals of care.  Tolerating a  regular diet.  Therapy evaluations completed and patient was admitted for a comprehensive rehab program. Please see  preadmission assessment from earlier today as well.    Patient currently requires min with basic self-care skills secondary to muscle weakness, decreased cardiorespiratoy endurance, decreased attention, decreased awareness, decreased problem solving, decreased safety awareness and decreased memory and decreased standing balance and decreased postural control.  Prior to hospitalization, patient could complete BADLs with independent .  Patient will benefit from skilled intervention to increase independence with basic self-care skills prior to discharge home with family.  Anticipate patient will require 24 hour supervision and follow up home health.  OT - End of Session Endurance Deficit: Yes OT Assessment Rehab Potential (ACUTE ONLY): Good OT Barriers to Discharge: Medical stability OT Patient demonstrates impairments in the following area(s): Balance;Motor;Cognition;Endurance;Safety OT Basic ADL's Functional Problem(s): Grooming;Bathing;Dressing;Toileting OT Advanced ADL's Functional Problem(s): Light Housekeeping;Laundry OT Transfers Functional Problem(s): Toilet;Tub/Shower OT Additional Impairment(s): None OT Plan OT Intensity: Minimum of 1-2 x/day, 45 to 90 minutes OT Frequency: 5 out of 7 days OT Duration/Estimated Length of Stay: 10-12 days OT Treatment/Interventions: Balance/vestibular training;Community reintegration;Disease mangement/prevention;Neuromuscular re-education;Patient/family education;Cognitive remediation/compensation;Discharge planning;DME/adaptive equipment instruction;Functional mobility training;Pain management;Psychosocial support;Self Care/advanced ADL retraining;Therapeutic Exercise;UE/LE Coordination activities;Wheelchair propulsion/positioning;UE/LE Strength taining/ROM;Therapeutic Activities;Visual/perceptual remediation/compensation OT Self Feeding Anticipated Outcome(s): No goal OT Basic Self-Care Anticipated Outcome(s): Supervision OT Toileting Anticipated  Outcome(s): Supervision OT Bathroom Transfers Anticipated Outcome(s): Supervision OT Recommendation Patient destination: Home Follow Up Recommendations: Home health OT Equipment Recommended: To be determined Skilled Therapeutic Intervention Skilled OT session completed with focus on initial evaluation, education on OT role/POC, and establishment of patient-centered goals.   Pt greeted on toilet with NT present to supervise. Pt asking OT where "Jerene Canny" was, stating he was going to help her shower later today. Pt continued voiding with continent B+B void. Note that she used a very large amount of toilet paper for hygiene. Min A for thoroughness with pt standing from toilet with Mod A sit<stand using RW. Pt then ambulated with device and Min A to the sink to wash her hands, vcs for keeping walker on the floor during ambulation and also for safe positioning at the sink. She was distracted by conversing with dtr in the room and washed her hands twice. Transitioned to bathing/dressing tasks completed EOB at sit<stand level without device. Pt required vcs for sequencing, exhibited internal and external distraction, language of confusion (I.e. stating she was going to work soon, worked "at [employer] and when asked what department, pt responded "at the top of the [place]"), and impaired attention to task, often started giggling when OT provided instruction. Min-Mod A sit<stands, Min A for dynamic standing balance while she completed perihygiene and LB dressing tasks. OT assisted her with donning Teds though she was able to don both gripper socks using figure 4 position. Dtr present to assist with providing reliable PLOF information and also info about bathroom layout/accessibility on the main level. Supervision/cuing for oral care with setup at EOB. Pt reported feeling too fatigued at this point to complete this task at the sink and wanted to lie down after. Supervision for returning to supine position. Pt gripped the  headboard with both hands, counted out loud to three, and then bursted out laughing. OT cued pt to pull herself up and pt still did not move. Dtr assisted OT with boosting pt up in bed. Left pt comfortably in bed with all needs within reach and bed alarm set.   OT Evaluation Precautions/Restrictions  Precautions Precautions: Fall Vital  Signs Therapy Vitals Temp: 97.9 F (36.6 C) Temp Source: Oral Pulse Rate: 74 Resp: 20 BP: (!) 112/46 Patient Position (if appropriate): Sitting Oxygen Therapy SpO2: 100 % O2 Device: Room Air Pain: pt denied having pain during session    Home Living/Prior [place] expects to be discharged to:: Private residence Living Arrangements: Spouse/significant other Available Help at Discharge: Family Type of Home: House Home Access: Level entry Home Layout: Two level, Able to live on main level with bedroom/bathroom Bathroom Shower/Tub: Gaffer, Chiropodist: Standard Bathroom Accessibility: Yes Additional Comments: Information regarding pts bathroom layout provided by pts daughter, Rob Bunting  Lives With: Spouse, Family(two teenaged children) IADL History Homemaking Responsibilities: Yes(per dtr and pt, pt was independent with meal prep, laundry, and housekeeping PTA) Type of Occupation: Per pt, working "at [employer]" when asked which department, 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" Leisure and Hobbies: Spending time with her two dogs Prior Function Level of Independence: Independent with basic ADLs, Independent with homemaking with ambulation Driving: Yes ADL ADL Eating: Not assessed Grooming: Supervision/safety Where Assessed-Grooming: Edge of bed Upper Body Bathing: Supervision/safety Where Assessed-Upper Body Bathing: Edge of bed Lower Body Bathing: Minimal assistance Where Assessed-Lower Body Bathing: Edge of bed Upper Body Dressing: Minimal assistance Where Assessed-Upper Body Dressing:  Edge of bed Lower Body Dressing: Moderate assistance Where Assessed-Lower Body Dressing: Edge of bed Toileting: Minimal assistance Where Assessed-Toileting: Glass blower/designer: Moderate assistance Toilet Transfer Method: Ambulating(RW) Tub/Shower Transfer: Not assessed Vision Baseline Vision/History: No visual deficits(per dtr report) Patient Visual Report: No change from baseline Perception  Perception: Within Functional Limits Praxis Praxis: Impaired Praxis Impairment Details: Perseveration Cognition Arousal/Alertness: Awake/alert Orientation Level: Person;Place;Situation Person: Oriented Place: Oriented Situation: Disoriented Year: 2021 Month: April Day of Week: Correct Memory: Impaired Immediate Memory Recall: Sock;[place];Bed Memory Recall Sock: Without Cue Memory Recall [place]: Without Cue Memory Recall Bed: Without Cue Attention: Sustained Sustained Attention: Impaired Problem Solving: Impaired Behaviors: Impulsive Safety/Judgment: Impaired Sensation Sensation Light Touch: Appears Intact Proprioception: Appears Intact Coordination Gross Motor Movements are Fluid and Coordinated: No Fine Motor Movements are Fluid and Coordinated: No Coordination and Movement Description: Mild coordination deficits, decreased postural control in standing Finger Nose Finger Test: WNL bilaterally Motor  Motor Motor: Other (comment) Motor - Skilled Clinical Observations: Mild incoordination and postural control deficits Mobility  Mod A toilet transfer using RW at ambulatory level  Trunk/Postural Assessment  Cervical Assessment Cervical Assessment: Within Functional Limits Thoracic Assessment Thoracic Assessment: Within Functional Limits Lumbar Assessment Lumbar Assessment: Within Functional Limits Postural Control Postural Control: Deficits on evaluation(impaired)  Balance Balance Balance Assessed: Yes Dynamic Sitting Balance Dynamic Sitting - Balance Support: No upper  extremity supported Dynamic Sitting - Level of Assistance: 5: Stand by assistance(threading LEs into pants while sitting EOB) Dynamic Standing Balance Dynamic Standing - Balance Support: No upper extremity supported Dynamic Standing - Level of Assistance: 4: Min assist(perihygiene completion while standing without AD) Extremity/Trunk Assessment RUE Assessment RUE Assessment: Within Functional Limits LUE Assessment LUE Assessment: Within Functional Limits     Refer to Care Plan for Long Term Goals  Recommendations for other services: None    Discharge Criteria: Patient will be discharged from OT if patient refuses treatment 3 consecutive times without medical reason, if treatment goals not met, if there is a change in medical status, if patient makes no progress towards goals or if patient is discharged from hospital.  The above assessment, treatment plan, treatment alternatives and goals were discussed and mutually agreed  upon: by patient  Skeet Simmer 01/09/2020, 4:25 PM

## 2020-01-10 NOTE — Progress Notes (Signed)
[facility] PHYSICAL MEDICINE & REHABILITATION PROGRESS NOTE  Subjective/Complaints: Patient seen laying in bed this morning.  She states she slept well overnight.  No reported issues overnight.  She indicates she had a good first day of therapies yesterday,?  Reliability.  ROS: Limited due to cognition  Objective: Vital Signs: Blood pressure 110/73, pulse (!) 55, temperature 97.7 F (36.5 C), temperature source Oral, resp. rate 16, weight 72.9 kg, SpO2 100 %. No results found. Recent Labs    01/08/20 0426  WBC 16.9*  HGB 7.0*  HCT 22.8*  PLT 53*   Recent Labs    01/08/20 0426  NA 138  K 3.8  CL 108  CO2 22  GLUCOSE 121*  BUN 6  CREATININE 0.64  CALCIUM 8.6*    Physical Exam: BP 110/73 (BP Location: Left Arm)   Pulse (!) 55   Temp 97.7 F (36.5 C) (Oral)   Resp 16   Wt 72.9 kg   SpO2 100%   BMI 27.59 kg/m  Constitutional: No distress . Vital signs reviewed. HENT: Normocephalic.  Atraumatic. Eyes: EOMI. No discharge.  Scleral icterus. Cardiovascular: No JVD. Respiratory: Normal effort.  No stridor. GI: Non-distended. Skin: Warm and dry.  Intact. Psych: Some confabulation. Musc: No edema in extremities.  No tenderness in extremities. Neurological: Alert Makes good eye contact with examiner.   Decreased awareness of her deficits. Motor: Grossly 4-4+/5 throughout , unchanged  Assessment/Plan: 1. Functional deficits secondary to encephalopathy which require 3+ hours per day of interdisciplinary therapy in a comprehensive inpatient rehab setting.  Physiatrist is providing close team supervision and 24 hour management of active medical problems listed below.  Physiatrist and rehab team continue to assess barriers to discharge/monitor patient progress toward functional and medical goals  Care Tool:  Bathing    Body parts bathed by patient: Right arm, Left arm, Chest, Abdomen, Front perineal area, Buttocks, Right upper leg, Left upper leg, Left lower leg,  Right lower leg, Face         Bathing assist Assist Level: Minimal Assistance - Patient > 75%     Upper Body Dressing/Undressing Upper body dressing   What is the patient wearing?: Pull over shirt    Upper body assist Assist Level: Minimal Assistance - Patient > 75%    Lower Body Dressing/Undressing Lower body dressing      What is the patient wearing?: Incontinence brief, Pants     Lower body assist Assist for lower body dressing: Moderate Assistance - Patient 50 - 74%     Toileting Toileting    Toileting assist Assist for toileting: Minimal Assistance - Patient > 75%     Transfers Chair/bed transfer  Transfers assist     Chair/bed transfer assist level: Minimal Assistance - Patient > 75%     Locomotion Ambulation   Ambulation assist      Assist level: Moderate Assistance - Patient 50 - 74% Assistive device: No Device Max distance: 120   Walk 10 feet activity   Assist     Assist level: Minimal Assistance - Patient > 75%     Walk 50 feet activity   Assist    Assist level: Moderate Assistance - Patient - 50 - 74% Assistive device: No Device    Walk 150 feet activity   Assist    Assist level: Moderate Assistance - Patient - 50 - 74% Assistive device: No Device    Walk 10 feet on uneven surface  activity   Assist  Assist level: Moderate Assistance - Patient - 50 - 74%     Wheelchair     Assist   Type of Wheelchair: Manual    Wheelchair assist level: Minimal Assistance - Patient > 75% Max wheelchair distance: 100    Wheelchair 50 feet with 2 turns activity    Assist        Assist Level: Minimal Assistance - Patient > 75%   Wheelchair 150 feet activity     Assist     Assist Level: Moderate Assistance - Patient 50 - 74%      Medical Problem List and Plan: 1.  Decreased functional mobility with altered mental status secondary to Wernicke's versus hepatic encephalopathy related to alcohol abuse.   Continue lactulose as directed as well as prednisolone x28 days total per GI services as well as Xifaxan for chronic liver disease  Continue CIR 2.  Antithrombotics: -DVT/anticoagulation: SCDs             -antiplatelet therapy: N/A 3. Pain Management: Tylenol 4. Mood: Trazodone 25 mg nightly             -antipsychotic agents: N/A 5. Neuropsych: This patient is not capable of making decisions on her own behalf. 6. Skin/Wound Care: Routine skin checks 7. Fluids/Electrolytes/Nutrition: Routine in and outs.  8.  Coagulopathy/thrombocytopenia/chronic macrocytic anemia/leukocytosis.  Leukocytosis felt to be related to prednisolone.  Follow-up chemistries.  Transfuse if symptomatic             Hemoglobin 7.0 on 4/30, labs ordered for tomorrow  Platelets 53 on 4/30, labs ordered for tomorrow  WBCs 16.9 on 4/30, labs ordered for tomorrow 9.  Orthostasis.  ProAmatine 3 times daily.  Controlled on 5/2  Monitor with increased mobility 10.  Decreased nutritional storage.  Follow-up dietary services             Supplementation 11.  Steroid-induced hyperglycemia  Monitor with increased mobility, labs ordered for tomorrow 12.  Transaminitis  LFTs elevated on 4/30, labs ordered for tomorrow  LOS: 2 days A FACE TO FACE EVALUATION WAS PERFORMED  Kiano Terrien Lorie Phenix 01/10/2020, 8:31 AM

## 2020-01-11 ENCOUNTER — Inpatient Hospital Stay (HOSPITAL_COMMUNITY): Payer: Medicaid Other | Admitting: Occupational Therapy

## 2020-01-11 ENCOUNTER — Inpatient Hospital Stay (HOSPITAL_COMMUNITY): Payer: Medicaid Other

## 2020-01-11 ENCOUNTER — Inpatient Hospital Stay (HOSPITAL_COMMUNITY): Payer: Medicaid Other | Admitting: Speech Pathology

## 2020-01-11 DIAGNOSIS — D72823 Leukemoid reaction: Secondary | ICD-10-CM

## 2020-01-11 LAB — CBC WITH DIFFERENTIAL/PLATELET
Abs Immature Granulocytes: 0.1 10*3/uL — ABNORMAL HIGH (ref 0.00–0.07)
Basophils Absolute: 0 10*3/uL (ref 0.0–0.1)
Basophils Relative: 0 %
Eosinophils Absolute: 0 10*3/uL (ref 0.0–0.5)
Eosinophils Relative: 0 %
HCT: 25.4 % — ABNORMAL LOW (ref 36.0–46.0)
Hemoglobin: 7.8 g/dL — ABNORMAL LOW (ref 12.0–15.0)
Immature Granulocytes: 1 %
Lymphocytes Relative: 5 %
Lymphs Abs: 0.7 10*3/uL (ref 0.7–4.0)
MCH: 34.8 pg — ABNORMAL HIGH (ref 26.0–34.0)
MCHC: 30.7 g/dL (ref 30.0–36.0)
MCV: 113.4 fL — ABNORMAL HIGH (ref 80.0–100.0)
Monocytes Absolute: 1.3 10*3/uL — ABNORMAL HIGH (ref 0.1–1.0)
Monocytes Relative: 9 %
Neutro Abs: 12.6 10*3/uL — ABNORMAL HIGH (ref 1.7–7.7)
Neutrophils Relative %: 85 %
Platelets: 108 10*3/uL — ABNORMAL LOW (ref 150–400)
RBC: 2.24 MIL/uL — ABNORMAL LOW (ref 3.87–5.11)
RDW: 22.7 % — ABNORMAL HIGH (ref 11.5–15.5)
WBC: 14.7 10*3/uL — ABNORMAL HIGH (ref 4.0–10.5)
nRBC: 0 % (ref 0.0–0.2)

## 2020-01-11 LAB — COMPREHENSIVE METABOLIC PANEL
ALT: 61 U/L — ABNORMAL HIGH (ref 0–44)
AST: 128 U/L — ABNORMAL HIGH (ref 15–41)
Albumin: 2.1 g/dL — ABNORMAL LOW (ref 3.5–5.0)
Alkaline Phosphatase: 161 U/L — ABNORMAL HIGH (ref 38–126)
Anion gap: 8 (ref 5–15)
BUN: 7 mg/dL (ref 6–20)
CO2: 21 mmol/L — ABNORMAL LOW (ref 22–32)
Calcium: 8.8 mg/dL — ABNORMAL LOW (ref 8.9–10.3)
Chloride: 109 mmol/L (ref 98–111)
Creatinine, Ser: 0.52 mg/dL (ref 0.44–1.00)
GFR calc Af Amer: >60 mL/min (ref >60)
GFR calc non Af Amer: >60 mL/min (ref >60)
Glucose, Bld: 103 mg/dL — ABNORMAL HIGH (ref 70–99)
Potassium: 3.5 mmol/L (ref 3.5–5.1)
Sodium: 138 mmol/L (ref 135–145)
Total Bilirubin: 7.4 mg/dL — ABNORMAL HIGH (ref 0.3–1.2)
Total Protein: 6.4 g/dL — ABNORMAL LOW (ref 6.5–8.1)

## 2020-01-11 NOTE — Progress Notes (Signed)
[facility] PHYSICAL MEDICINE & REHABILITATION PROGRESS NOTE  Subjective/Complaints: "things are still going around in circles" (in regard to her thinking). Denies pain.   ROS: Limited due to cognitive/behavioral    Objective: Vital Signs: Blood pressure 106/70, pulse 96, temperature 97.9 F (36.6 C), temperature source Oral, resp. rate 18, weight 70.8 kg, SpO2 100 %. No results found. Recent Labs    01/11/20 0350  WBC 14.7*  HGB 7.8*  HCT 25.4*  PLT 108*   Recent Labs    01/11/20 0350  NA 138  K 3.5  CL 109  CO2 21*  GLUCOSE 103*  BUN 7  CREATININE 0.52  CALCIUM 8.8*    Physical Exam: BP 106/70 (BP Location: Left Arm)   Pulse 96   Temp 97.9 F (36.6 C) (Oral)   Resp 18   Wt 70.8 kg   SpO2 100%   BMI 26.79 kg/m  Constitutional: No distress . Vital signs reviewed. HEENT: EOMI, oral membranes moist, icteric Neck: supple Cardiovascular: RRR without murmur. No JVD    Respiratory/Chest: CTA Bilaterally without wheezes or rales. Normal effort    GI/Abdomen: BS +, non-tender, non-distended Ext: no clubbing, cyanosis, or edema Psych: pleasant and cooperative Skin: Warm and dry.  Intact. Psych: Some confabulation. Can be redirected though. Oriented to place, person. anxious Musc: No edema in extremities.  No tenderness in extremities. Neurological: Alert Motor: Grossly 4-4+/5 in all 4 Assessment/Plan: 1. Functional deficits secondary to encephalopathy which require 3+ hours per day of interdisciplinary therapy in a comprehensive inpatient rehab setting.  Physiatrist is providing close team supervision and 24 hour management of active medical problems listed below.  Physiatrist and rehab team continue to assess barriers to discharge/monitor patient progress toward functional and medical goals  Care Tool:  Bathing    Body parts bathed by patient: Right arm, Left arm, Chest, Abdomen, Front perineal area, Buttocks, Right upper leg, Left upper leg, Left lower  leg, Right lower leg, Face   Body parts bathed by helper: Front perineal area, Buttocks     Bathing assist Assist Level: Minimal Assistance - Patient > 75%     Upper Body Dressing/Undressing Upper body dressing   What is the patient wearing?: Pull over shirt    Upper body assist Assist Level: Minimal Assistance - Patient > 75%    Lower Body Dressing/Undressing Lower body dressing      What is the patient wearing?: Incontinence brief, Pants     Lower body assist Assist for lower body dressing: Moderate Assistance - Patient 50 - 74%     Toileting Toileting    Toileting assist Assist for toileting: Moderate Assistance - Patient 50 - 74%     Transfers Chair/bed transfer  Transfers assist     Chair/bed transfer assist level: Supervision/Verbal cueing     Locomotion Ambulation   Ambulation assist      Assist level: Moderate Assistance - Patient 50 - 74% Assistive device: No Device Max distance: 120   Walk 10 feet activity   Assist     Assist level: Minimal Assistance - Patient > 75%     Walk 50 feet activity   Assist    Assist level: Moderate Assistance - Patient - 50 - 74% Assistive device: No Device    Walk 150 feet activity   Assist    Assist level: Moderate Assistance - Patient - 50 - 74% Assistive device: No Device    Walk 10 feet on uneven surface  activity   Assist  Assist level: Moderate Assistance - Patient - 50 - 74%     Wheelchair     Assist   Type of Wheelchair: Manual    Wheelchair assist level: Minimal Assistance - Patient > 75% Max wheelchair distance: 100    Wheelchair 50 feet with 2 turns activity    Assist        Assist Level: Minimal Assistance - Patient > 75%   Wheelchair 150 feet activity     Assist     Assist Level: Moderate Assistance - Patient 50 - 74%      Medical Problem List and Plan: 1.  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  Continue lactulose as directed as well as prednisolone x28 days total per GI services as well as Xifaxan for chronic liver disease  Continue CIR 2.  Antithrombotics: -DVT/anticoagulation: SCDs             -antiplatelet therapy: N/A 3. Pain Management: Tylenol 4. Mood: Trazodone 25 mg nightly             -antipsychotic agents: N/A 5. Neuropsych: This patient is not capable of making decisions on her own behalf. 6. Skin/Wound Care: Routine skin checks 7. Fluids/Electrolytes/Nutrition: Routine in and outs.  8.  Coagulopathy/thrombocytopenia/chronic macrocytic anemia/leukocytosis.  Leukocytosis felt to be related to prednisolone.  Follow-up chemistries.  Transfuse if symptomatic             Hemoglobin 7.0 on 4/30, 7.8 5/3  Platelets 53 on 4/30, up to 108k 5/3  WBCs 16.9 on 4/30, down to 14.7 5/3 9.  Orthostasis.  ProAmatine 3 times daily.  Controlled on 5/3  Monitor with increased mobility 10.  Decreased nutritional storage.  Follow-up dietary services             Supplementation 11.  Steroid-induced hyperglycemia  Sl glucose elevation today 12.  Transaminitis  LFT's stable to improved today 5/3  LOS: 3 days A FACE TO [place] 01/11/2020, 10:18 AM

## 2020-01-11 NOTE — Care Management (Addendum)
[facility] Individual Statement of Services  Patient Name:  Joann [name]  Date:  01/11/2020  Welcome to the [facility].  Our goal is to provide you with an individualized program based on your diagnosis and situation, designed to meet your specific needs.  With this comprehensive rehabilitation program, you will be expected to participate in at least 3 hours of rehabilitation therapies Monday-Friday, with modified therapy programming on the weekends.  Your rehabilitation program will include the following services:  Physical Therapy (PT), Occupational Therapy (OT), Speech Therapy (ST), 24 hour per day rehabilitation nursing, Therapeutic Recreaction (TR), Psychology, Neuropsychology, Case Management (Social Worker), Rehabilitation Medicine, Nutrition Services, Pharmacy Services and Other  Weekly team conferences will be held on Tuesdays to discuss your progress.  Your Social Worker will talk with you frequently to get your input and to update you on team discussions.  Team conferences with you and your family in attendance may also be held.  Expected length of stay: 7-14 days    Overall anticipated outcome: Supervision  Depending on your progress and recovery, your program may change. Your Social Worker will coordinate services and will keep you informed of any changes. Your Social Worker's name and contact numbers are listed  below.  The following services may also be recommended but are not provided by the [facility] will be made to provide these services after discharge if needed.  Arrangements include referral to agencies that provide these services.  Your insurance has been verified to be:  Medicaid   Your primary doctor is:  Nelwyn Salisbury  Pertinent information will be shared with  your doctor and your insurance company.  Social Worker:  Loralee Pacas, [place] or [phone]  Information discussed with and copy given to patient by: Rana Snare, 01/11/2020, 10:27 AM

## 2020-01-11 NOTE — Progress Notes (Signed)
Inpatient Rehabilitation  Patient information reviewed and entered into eRehab system by Heru Montz M. Leylani Duley, M.A., CCC/SLP, PPS Coordinator.  Information including medical coding, functional ability and quality indicators will be reviewed and updated through discharge.    

## 2020-01-11 NOTE — Progress Notes (Signed)
Occupational Therapy Session Note  Patient Details  Name: Joann [name] MRN: [id number] Date of Birth: 14-Feb-1981  Today's Date: 01/11/2020 OT Individual Time: 0175-1025 OT Individual Time Calculation (min): 75 min    Short Term Goals: Week 1:  OT Short Term Goal 1 (Week 1): Pt will complete transfer off of toilet with Min A for sit<stand OT Short Term Goal 2 (Week 1): Pt will complete LB dressing with Min A OT Short Term Goal 3 (Week 1): Pt will complete 1 grooming task while standing at the sink to improve standing balance  Skilled Therapeutic Interventions/Progress Updates:    Pt greeted semi-reclined in bed awake, and agreeable to OT treatment session. OT provided pt with wc cushion for comfort in seated position and encourage more OOB activity. Pt came to sitting EOB with supervision but declined any BADL tasks today. Pt continues to have confusion throughout session, although she was oriented x4. Pt often talking in circles, asking questions about toxins in her brain. Pt with very poor memory and unable to tell OT the ages of her children, and details about her PLOF. Graded peg board puzzle completed with pt only needed min A for problem solving more difficulty peg board puzzle. Worked on standing balance and endurance with sit<>stands on small wedge, then placing UE's in front of her and holding for 5-10 seconds. Worked on weight shifting and hip/ankle balance strategies. Functional ambulation in the gym without AD and min A. Pt took seated rest break, then ambulated back to room without AD and min A. Worked on pathfinding and problem solving with visual scanning to locate room numbers. Pt ambulated into bathroom with min A, voided bladder and completed peri-care with CGA. Pt ambulated back to bed and declined sitting in wc. Pt left semi-reclined in bed with needs met.    Therapy Documentation Precautions:  Precautions Precautions: Fall Restrictions Weight Bearing Restrictions: No Pain:  denies pain  Therapy/Group: Individual Therapy  Valma Cava 01/11/2020, 10:31 AM

## 2020-01-11 NOTE — Progress Notes (Signed)
Speech Language Pathology Daily Session Note  Patient Details  Name: Joann [name] MRN: [id number] Date of Birth: 1981-08-27  Today's Date: 01/11/2020 SLP Individual Time: 0725-0805 SLP Individual Time Calculation (min): 40 min  Short Term Goals: Week 1: SLP Short Term Goal 1 (Week 1): Patient will utilize external aids as trained and implemented in therapy to enhance functional cognitive-linguistic skills with 80% accuracy and mod cues. SLP Short Term Goal 2 (Week 1): Patient will demonstrate sustained attention to task for 15 minutes with mod verbal cues for redirection. SLP Short Term Goal 3 (Week 1): Patient will complete delayed recall tasks for 5 stimuli post ~5 minute delay with 80% accuracy and mod cues. SLP Short Term Goal 4 (Week 1): Patient will complete functional problem solving scenarios with 80% accuracy and mod cues.  Skilled Therapeutic Interventions: Skilled treatment session focused on cognitive goals. Upon arrival, patient had been incontinent of urine. SLP provided Mod verbal cues for problem solving and sequencing while donning a new brief and pants. SLP also facilitated session by providing Mod A verbal cues for problem solving with tray set-up as patient was originally eating her breakfast with her hands. Throughout the meal, patient appeared confused and frequently talked about "the matrix" and asking questions about of her "father knew about her drinking." SLP provided total A multimodal cues for orientation to time, place and situation.  Mod verbal cues were also needed for use of an increased vocal intensity at the phrase level to achieve ~80% intelligibility. Patient left supine in bed with alarm on and all needs within reach. Continue with current plan of care.      Pain Pain Assessment Pain Scale: 0-10 Pain Score: 0-No pain Faces Pain Scale: No hurt  Therapy/Group: Individual Therapy  Howell Groesbeck 01/11/2020, 2:38 PM

## 2020-01-11 NOTE — Progress Notes (Signed)
Social Work Assessment and Plan   Patient Details  Name: Joann [name] MRN: [id number] Date of Birth: 1981/06/27  Today's Date: 01/11/2020  Problem List:  Patient Active Problem List   Diagnosis Date Noted  . Wernicke's encephalopathy   . Transaminitis   . Steroid-induced hyperglycemia   . Leucocytosis   . Orthostasis   . Macrocytic anemia   . AMS (altered mental status)   . Sleep disturbance   . Pressure injury of skin 01/05/2020  . Alcoholic hepatitis with ascites   . Acute respiratory failure ([place])   . Palliative care encounter   . Elevated bilirubin   . Ascites   . Advanced care planning/counseling discussion   . Goals of care, counseling/discussion   . Palliative care by specialist   . Hypoalbuminemia   . Hepatic encephalopathy ([place]) 12/24/2019  . Alcoholic hepatitis without ascites   . Hypokalemia due to inadequate potassium intake 04/02/2019  . Alcohol withdrawal ([place]) 10/24/2018  . Alcohol dependence with withdrawal, unspecified ([place]) 10/24/2018  . Elevated LFTs 10/24/2018  . Thrombocytopenia ([place]) 10/24/2018  . Alcohol-induced acute pancreatitis   . Acute blood loss anemia   . Alcoholic ketoacidosis 18/56/3149  . Alcohol abuse with alcohol-induced mood disorder ([place]) 09/14/2017  . Elevated liver enzymes 04/29/2017   Past Medical History:  Past Medical History:  Diagnosis Date  . Alcohol abuse   . Anxiety   . Hypertension   . Liver disease    Past Surgical History:  Past Surgical History:  Procedure Laterality Date  . ECTOPIC PREGNANCY SURGERY    . ESOPHAGOGASTRODUODENOSCOPY (EGD) WITH PROPOFOL N/A 12/27/2019   Procedure: ESOPHAGOGASTRODUODENOSCOPY (EGD) WITH PROPOFOL;  Surgeon: Carol Ada, MD;  Location: [facility];  Service: Endoscopy;  Laterality: N/A;   Social History:  reports that she has never smoked. She has never used smokeless tobacco. She reports current alcohol use. She reports that she does not use drugs.  Family / Support  Systems Marital Status: Married How Long?: 9 years Patient Roles: Spouse, Parent Spouse/Significant Other: terrance [phone]) Children: 24 y.o. son; 30 y.o. dtr Rob Bunting [phone]) Other Supports: pt mother Erma Anticipated Caregiver: Husband and mother Ability/Limitations of Caregiver: None reported Caregiver Availability: 24/7 Family Dynamics: Pt lives with husband and their 83 y.o. son (intermittent as he stays with his sister during the week d/t remote education and parents work schedule).  Social History Preferred language: English Religion: Baptist Cultural Background: Pt has worked as an Corporate treasurer. 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 Pt struggles with recall, and becomes tearful when unable to remember. Recent Psychosocial Issues: N/A Psychiatric History: Pt admits to a hx of depression/anxiety and that she has never taken any medication. Pt continued to repeat that she was just self medicating. Substance Abuse History: Denies; Significant EtoH per pt report and EMR.  Patient / Family Perceptions, Expectations & Goals Pt/Family understanding of illness & functional limitations: Family has a general understanding of pt medical condition Premorbid pt/family roles/activities: Independent Anticipated changes in roles/activities/participation: Possible 24/7 care Pt/family expectations/goals: Husband's goal: "get memory back in tact, and for her to be able to do normal things."  [place]  Agencies: None Premorbid Home Care/DME Agencies: None Transportation available at discharge: Family to transport home Resource referrals recommended: Neuropsychology  Discharge Planning Living Arrangements: Spouse/significant other, Children Support Systems: Spouse/significant other, Children, Other relatives, Parent Type of Residence: Private residence Insurance Resources: Medicaid (specify county)([place] [employer]) Financial Resources: Employment Financial Screen Referred: No Living Expenses: Own Money Management: Patient, Spouse Does the patient have any problems obtaining your medications?: No Patient/Family Preliminary Plans: Husband to manage pt finances Social Work Anticipated Follow Up Needs: HH/OP Expected length of stay: 7-14 days  Clinical Impression SW met with pt in room to introduce self, explain role, and discuss discharge process. Pt not a veteran. No HCPOA. No DME.  During session, pt has been trying to recall how long she has been in the hospital. Pt becomes very tearful when unable to remember things.   Rana Snare 01/11/2020, 2:38 PM

## 2020-01-11 NOTE — Progress Notes (Signed)
Physical Therapy Session Note  Patient Details  Name: Joann [name] MRN: [id number] Date of Birth: 02-Apr-1981  Today's Date: 01/11/2020 PT Individual Time: 1125-1158 PT Individual Time Calculation (min): 33 min   Short Term Goals: Week 1:  PT Short Term Goal 1 (Week 1): STG=LTG  Skilled Therapeutic Interventions/Progress Updates:   Session focused on functional mobility re-training during transfers, bed mobility and gait without AD, NMR to address dynamic standing balance and coordination activities, and cognitive remediation during functional tasks. Pt requires cues for overall attention to task in distracting environment. Pt repeatedly brings up being "dead for 3 days" and "toxins in her brain". Education provided throughout session and recommend follow up with neuropsych during stay for coping. Pt tearful at times. Does demonstrate decreased insight and awareness into her functional and physical deficits in terms of d/c planning as well as longer term. Pt performs functional transfers throughout session with overall close supervision to CGA for balance during transition. Pt performs gait on unit without AD with overall CGA to min assist due to mild LOB and noted sway, staggered stepping, and decreased balance strategies and awareness overall. Pt performs dynamic gait for NMR through obstacle course including navigating cones, stepping over cones, and picking up objects off the floor. Overall requires min assist for higher level dynamic tasks as described. Stair negotiation training for functional mobility and balance decreasing amount of rail for support first time with bilateral rails with CGA, then min assist with single rail (once trial L and then trial with R - more difficulty with R rail only) x 24 steps total. End of session set up in recliner with all needs in reach and chair alarm on.  Therapy Documentation Precautions:  Precautions Precautions: Fall Restrictions Weight Bearing  Restrictions: No    Pain: No reports of pain.   Therapy/Group: Individual Therapy  Joann [name], PT, DPT, CBIS  01/11/2020, 12:03 PM

## 2020-01-11 NOTE — Progress Notes (Signed)
Physical Therapy Session Note  Patient Details  Name: Joann [name] MRN: [id number] Date of Birth: 1981/08/23  Today's Date: 01/11/2020 PT Individual Time: 1400-1500 PT Individual Time Calculation (min): 60 min   Short Term Goals: Week 1:  PT Short Term Goal 1 (Week 1): STG=LTG  Skilled Therapeutic Interventions/Progress Updates:     Patient in recliner in the room upon PT arrival. Patient alert and agreeable to PT session. Patient denied pain during session. Perseverated on present illness including stating "I was in a coma for 2 weeks," "the toxins in my brain," and "I would self-medicate and self-medicate." Discussed liver failure secondary to alcohol abuse. Patient wants to be able to stop drinking at d/c and is open to receiving resources to do so. 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   Therapeutic Activity: Transfers: Patient performed sit to/from stand x8 with close supervision for safety without an AD. Provided verbal cues for controlled descent and reaching back to sit for safety. Five times Sit to Stand Test (FTSS) Method: Use a straight back chair with a solid seat that is 16-18" high. Ask participant to sit on the chair with arms folded across their chest.   Instructions: "Stand up and sit down as quickly as possible 5 times, keeping your arms folded across your chest."   Measurement: Stop timing when the participant stands the 5th time.  TIME: __43____ (in seconds)  Times > 13.6 seconds is associated with increased disability and morbidity (Guralnik, 2000) Times > 15 seconds is predictive of recurrent falls in healthy individuals aged 10 and older (Buatois, et al., 2008) Normal performance values in community dwelling individuals aged 45 and older  (Bohannon, 2006): o 60-69 years: 11.4 seconds o 70-79 years: 12.6 seconds o 80-89 years: 14.8 seconds  MCID: ? 2.3 seconds for Vestibular Disorders (Meretta, 2006)  Gait Training:  Patient ambulated >250 feet, 110 feet, and >150 feet without an AD with close supervision with intermittent CGA x2 for mild LOB, patient able to self correct. Ambulated with decreased gait speed, decreased step length and height, decreased B arm swing and trunk rotation, and downward head gaze. Provided verbal cues for looking ahead and scanning environment, increased arm swing for improved balance, and increased step height, and propulsion. Patient ascended/descended 12-6" steps while performing the FGA, see below, using 1 rail on first 2 steps then 2 rails with increased UE use with fatigue with CGA. Performed reciprocal with intermittent step-to gait pattern with variable leading LE.   Neuromuscular Re-ed: Patient performed the Beg Balance test and the Functional Gait Assessment (see details below): Patient demonstrates increased fall risk as noted by score of  46/56 on Berg Balance Scale.  (<36= high risk for falls, close to 100%; 37-45 significant >80%; 46-51 moderate >50%; 52-55 lower >25%). She also demonstrated increased risk for falls with dynamic gait activities noted by score of 15/30 on the FGA. (<19=increased risk for falls)  Patient in recliner in the room at end of session with breaks locked, chair alarm set, and all needs within reach.    Therapy Documentation Precautions:  Precautions Precautions: Fall Restrictions Weight Bearing Restrictions: No  Standardized Balance Assessment Berg Balance Test Sit to Stand: Able to stand without using hands and stabilize independently Standing Unsupported:  Able to stand safely 2 minutes Sitting with Back Unsupported but Feet Supported on Floor or Stool: Able to sit safely and securely 2 minutes Stand to Sit: Sits safely with minimal use of  hands Transfers: Able to transfer safely, definite need of hands Standing Unsupported with Eyes Closed: Able to stand 10 seconds safely Standing Ubsupported with Feet Together: Able to place feet together independently and stand 1 minute safely From Standing, Reach Forward with Outstretched Arm: Can reach confidently >25 cm (10") From Standing Position, Pick up Object from Floor: Able to pick up shoe safely and easily From Standing Position, Turn to Look Behind Over each Shoulder: Looks behind one side only/other side shows less weight shift Turn 360 Degrees: Able to turn 360 degrees safely in 4 seconds or less Standing Unsupported, Alternately Place Feet on Step/Stool: Able to complete >2 steps/needs minimal assist Standing Unsupported, One Foot in Front: Loses balance while stepping or standing Standing on One Leg: Able to lift leg independently and hold 5-10 seconds Total Score: 46/56 Functional Gait  Assessment Gait Level Surface: Walks 20 ft in less than 7 sec but greater than 5.5 sec, uses assistive device, slower speed, mild gait deviations, or deviates 6-10 in outside of the 12 in walkway width. Change in Gait Speed: Able to change speed, demonstrates mild gait deviations, deviates 6-10 in outside of the 12 in walkway width, or no gait deviations, unable to achieve a major change in velocity, or uses a change in velocity, or uses an assistive device. Gait with Horizontal Head Turns: Performs head turns smoothly with slight change in gait velocity (eg, minor disruption to smooth gait path), deviates 6-10 in outside 12 in walkway width, or uses an assistive device. Gait with Vertical Head Turns: Performs task with slight change in gait velocity (eg, minor disruption to smooth gait path), deviates 6 - 10 in outside 12 in walkway width or uses assistive device Gait and Pivot Turn: Pivot turns safely in greater than 3 sec and stops with no loss of balance, or pivot turns safely within 3 sec and  stops with mild imbalance, requires small steps to catch balance. Step Over Obstacle: Is able to step over one shoe box (4.5 in total height) but must slow down and adjust steps to clear box safely. May require verbal cueing. Gait with Narrow Base of Support: Ambulates 4-7 steps. Gait with Eyes Closed: Walks 20 ft, slow speed, abnormal gait pattern, evidence for imbalance, deviates 10-15 in outside 12 in walkway width. Requires more than 9 sec to ambulate 20 ft. Ambulating Backwards: Walks 20 ft, slow speed, abnormal gait pattern, evidence for imbalance, deviates 10-15 in outside 12 in walkway width. Steps: Two feet to a stair, must use rail. Total Score: 15/30 FGA comment:: 2 LOB during test with min A to correct and difficulty following cues x2   Therapy/Group: Individual Therapy  Garnet Chatmon L Eoghan Belcher PT, DPT  01/11/2020, 3:39 PM

## 2020-01-11 NOTE — Plan of Care (Signed)
  Problem: RH BLADDER ELIMINATION Goal: RH STG MANAGE BLADDER WITH ASSISTANCE Description: STG Manage Bladder With Min Assistance Outcome: Progressing Goal: RH STG MANAGE BLADDER WITH MEDICATION WITH ASSISTANCE Description: STG Manage Bladder With Medication With Min Assistance. Outcome: Progressing

## 2020-01-11 NOTE — Progress Notes (Signed)
Social Work Patient ID: Joann [name], female   DOB: 07/29/1981, 39 y.o.   MRN: [id number]   SW followed up with pt husband Joann [name] [phone]) to introduce self, explain role, and discuss discharge proress. SW informed pt husband there will be updates tomorrow after team conference. Pt husband had many questions surrounding pt medical condition with regard to the encephalopathy on how, why, and if this can be reversed. SW informed this concern will be relayed to medical team. In discussion about d/c plan. He intends to take time off, however, would like to know how long pt will need 24/7 care (if needed).    Loralee Pacas, MSW, [place] Office: [phone] Cell: [phone] Fax: [phone]

## 2020-01-11 NOTE — IPOC Note (Signed)
Overall Plan of Care [facility]) Patient Details Name: Joann [name] MRN: [id number] DOB: 10/08/1980  Admitting Diagnosis: Hepatic encephalopathy [facility])  Hospital Problems: Principal Problem:   Hepatic encephalopathy ([place]) Active Problems:   Wernicke's encephalopathy   Transaminitis   Steroid-induced hyperglycemia   Leucocytosis     Functional Problem List: Nursing Bladder, Bowel, Safety, Pain, Motor, Endurance  PT Balance, Behavior, Edema, Endurance, Motor, Nutrition, Pain, Perception, Safety, Skin Integrity  OT Balance, Motor, Cognition, Endurance, Safety  SLP    TR         Basic ADL's: OT Grooming, Bathing, Dressing, Toileting     Advanced  ADL's: OT Light Housekeeping, Laundry     Transfers: PT Bed Mobility, Furniture, Car, Floor, Bed to [employer], Tub/Shower     Locomotion: PT Ambulation, Emergency planning/management officer, Stairs, Other (comment)     Additional Impairments: OT None  SLP None      TR      Anticipated Outcomes Item Anticipated Outcome  Self Feeding No goal  Swallowing      Basic self-care  Supervision  Toileting  Supervision   Bathroom Transfers Supervision  Bowel/Bladder  Pt will manage bowel and bladder with min assist  Transfers  supervision assist with LRAD  Locomotion  Ambulatory with superivison assist and LRAD  Communication     Cognition  Supervision  Pain  Pt will manage pain at 3 or less on a scale of 0-10.  Safety/Judgment  Pt will remain free of falls with injury while in rehab with min assist   Therapy Plan: PT Intensity: Minimum of 1-2 x/day ,45 to 90 minutes PT Frequency: 5 out of 7 days PT Duration Estimated Length of Stay: 7-10 days OT Intensity: Minimum of 1-2 x/day, 45 to 90 minutes OT Frequency: 5 out of 7 days OT Duration/Estimated Length of Stay: 10-12 days SLP Intensity: Minumum of 1-2 x/day, 30 to 90 minutes SLP Frequency: 3 to 5 out of 7 days SLP Duration/Estimated Length of Stay: 10-14 days   Due to the  current state of emergency, patients may not be receiving their 3-hours of Medicare-mandated therapy.   Team Interventions: Nursing Interventions Patient/Family Education, Bladder Management, Disease Management/Prevention, Medication Management, Pain Management, Discharge Planning  PT interventions Ambulation/gait training, Discharge planning, Functional mobility training, Psychosocial support, Therapeutic Activities, Visual/perceptual remediation/compensation, Wheelchair propulsion/positioning, Therapeutic Exercise, Skin care/wound management, Neuromuscular re-education, Disease management/prevention, Training and development officer, Cognitive remediation/compensation, DME/adaptive equipment instruction, Pain management, Splinting/orthotics, UE/LE Strength taining/ROM, Community reintegration, Technical sales engineer stimulation, Patient/family education, IT trainer, UE/LE Coordination activities  OT Interventions Training and development officer, Community reintegration, Disease mangement/prevention, Brewing technologist, Patient/family education, Cognitive remediation/compensation, Discharge planning, DME/adaptive equipment instruction, Functional mobility training, Pain management, Psychosocial support, Self Care/advanced ADL retraining, Therapeutic Exercise, UE/LE Coordination activities, Wheelchair propulsion/positioning, UE/LE Strength taining/ROM, Therapeutic Activities, Visual/perceptual remediation/compensation  SLP Interventions Cognitive remediation/compensation, Cueing hierarchy, Functional tasks, Therapeutic Activities, Internal/external aids, Medication managment, Patient/family education  TR Interventions    SW/CM Interventions Discharge Planning, Psychosocial Support, Patient/Family Education   Barriers to Discharge MD  Medical stability  Nursing      PT Inaccessible home environment, Decreased caregiver support, Medical stability, Home environment access/layout, Insurance for SNF  coverage, Wound Care, Lack of/limited family support, Medication compliance, Behavior, Nutrition means    OT Medical stability    SLP      SW       Team Discharge Planning: Destination: PT-Home ,OT- Home , SLP-Home Projected Follow-up: PT-Home health PT, OT-  Home health OT, SLP-Home Health SLP Projected Equipment  Needs: PT-To be determined, OT- To be determined, SLP-None recommended by SLP Equipment Details: PT- , OT-  Patient/family involved in discharge planning: PT- Patient,  OT-Patient, Family member/caregiver, SLP-Patient  MD ELOS: 9-11 days Medical Rehab Prognosis:  Excellent Assessment: The patient has been admitted for CIR therapies with the diagnosis of hepatic encephalopathy. The team will be addressing functional mobility, strength, stamina, balance, safety, adaptive techniques and equipment, self-care, bowel and bladder mgt, patient and caregiver education, cognition, communication, psychological and substance abuse support/education. Goals have been set at supervision for self-care, mobility, and supervision for cognition (hopefully not min assist).   Due to the current state of emergency, patients may not be receiving their 3 hours per day of Medicare-mandated therapy.    [name], MD, FAAPMR      See Team Conference Notes for weekly updates to the plan of care

## 2020-01-12 ENCOUNTER — Inpatient Hospital Stay (HOSPITAL_COMMUNITY): Payer: Medicaid Other | Admitting: Speech Pathology

## 2020-01-12 ENCOUNTER — Inpatient Hospital Stay (HOSPITAL_COMMUNITY): Payer: Medicaid Other | Admitting: Occupational Therapy

## 2020-01-12 ENCOUNTER — Encounter (HOSPITAL_COMMUNITY): Payer: Medicaid Other | Admitting: Psychology

## 2020-01-12 ENCOUNTER — Inpatient Hospital Stay (HOSPITAL_COMMUNITY): Payer: Medicaid Other

## 2020-01-12 DIAGNOSIS — F329 Major depressive disorder, single episode, unspecified: Secondary | ICD-10-CM

## 2020-01-12 NOTE — Progress Notes (Signed)
Speech Language Pathology Daily Session Note  Patient Details  Name: Joann [name] MRN: [id number] Date of Birth: 08-Sep-1981  Today's Date: 01/12/2020 SLP Individual Time: 1415-1500 SLP Individual Time Calculation (min): 45 min  Short Term Goals: Week 1: SLP Short Term Goal 1 (Week 1): Patient will utilize external aids as trained and implemented in therapy to enhance functional cognitive-linguistic skills with 80% accuracy and mod cues. SLP Short Term Goal 2 (Week 1): Patient will demonstrate sustained attention to task for 15 minutes with mod verbal cues for redirection. SLP Short Term Goal 3 (Week 1): Patient will complete delayed recall tasks for 5 stimuli post ~5 minute delay with 80% accuracy and mod cues. SLP Short Term Goal 4 (Week 1): Patient will complete functional problem solving scenarios with 80% accuracy and mod cues.  Skilled Therapeutic Interventions: Skilled treatment session focused on cognitive goals. SLP facilitated session by providing a memory notebook for patient to record events from the day. Patient was able to recall events from previous therapy sessions with extra time and overall Min A verbal cues. Patient recalled her current medications and their functions with overall Min A verbal cues and required Mod verbal cues for functional problem solving while organizing a BID pill box. Overall, patient's cognitive functioning was improved today compared to yesterday's session. Patient left upright in the recliner with alarm on and all needs within reach. Continue with current plan of care.      Pain Pain Assessment Pain Scale: 0-10 Pain Score: 0-No pain  Therapy/Group: Individual Therapy  Joann [name], [place] 01/12/2020, 3:39 PM

## 2020-01-12 NOTE — Progress Notes (Signed)
Physical Therapy Session Note  Patient Details  Name: Joann [name] MRN: [id number] Date of Birth: Nov 09, 1980  Today's Date: 01/12/2020 PT Individual Time: 0800-0900 and  1100-1150 PT Individual Time Calculation (min): 60 min and 50 min   Short Term Goals: Week 1:  PT Short Term Goal 1 (Week 1): STG=LTG  Skilled Therapeutic Interventions/Progress Updates:     Session 1: Patient in seated in the bed donning B TED hose upon PT arrival. Patient alert and agreeable to PT session. Patient denied pain during session.   Therapeutic Activity:  Dressing: Patient donned TEDs with increased time and supervision for cues for correct placement, donned sports bra and t-shirt in sitting with min A for protecting central line and min cues for orienting her shirt, required 4 trials before able to follow cues. Doffed incontinence brief, as patient stated she has bladder and bowl control during the day, and donned mesh panties, pants and shoes with supervision, threading LEs in sitting and pulling up in standing.  Transfers: Patient performed sit to/from stand x6 with close supervision for safety. Provided verbal cues for forward weight shift and use of hands for safety. Patient performed standing balance with distant supervision for safety in front of the mirror in her room to brush her hair, brush her teeth, and put on light make-up to prepare for leaving the room.   Gait Training:  Patient ambulated ~400 feet x2 without an AD with close supervision and CGA x2 due to minor LOB due to poor foot clearance, L x1 and R x1. Ambulated with decreased gait speed, decreased B step length and propulsion, mild trendelenburg, and decreased step height causing her to catch her toe x2. Provided verbal cues for increased gait speed for increased step length and propulsion, increased step height for safety, and increased gluteal activation for stability on stance leg. Had patient navigate to the ortho gym from memory from  yesterday, also while ambulating had patient recall activities performed during yesterdays PT session, patient able to recall 4/5 activities with min A. On second trial focused on increased gait speed and propulsion with cues to "walk as fast as you can" and manual facilitation from PT's hand on her back to maintain increased speed throughout.   Neuromuscular Re-ed: Patient performed the following standing balance activities: -[employer] program "Endurance Rings" with R UE only standing with feet together on level ground, 2 min with 5 sec lag time, UL quadrant: Score 27 RT 0.86 sec, UR quadrant: Score 20 RT 0.98 sec, LL Quadrant: Score 34 RT 1.17, LR quadrant: score 32 RT 1.14 -D[employer] program "Red and Green" cued patient to only touch green lights set for 2 min and 2 sec lag time standing on Group foam wedge with L UE only, UL quadrant: Score 11/11 RT 1.1 sec, UR quadrant: Score 17/17 RT 1.16 sec, LL Quadrant: Score 7/10 RT 1.04, LR quadrant: score 16/18 RT 1.28, patient did not touch any red lights during trial *RT (reaction time) -sit to/from stand without UE support with focus on forward weight shift and controlled descent for increased LE motor control with a functional activity 2x5  Patient in recliner in the room at end of session with breaks locked, chair alarm set, and all needs within reach.   Session 2: Patient in recliner in the room upon PT arrival. Patient alert and agreeable to PT session. Patient denied pain during session. Requested to go outside. Focused session on ambulating outdoors for community ambulation and promotion of physical activity at  d/c.  Therapeutic Activity: Transfers: Patient performed sit to/from stand from the recliner x2 and a bench outside x2 with supervision. Provided verbal cues for forward weight shift.  Gait Training:  Patient ambulated >500 x2 feet over level and unlevel (sloped side walk, grass, mulch) surfaces outside without an AD with supervision  for safety. Ambulated as above. Provided verbal cues for stopping to find her balance for safety when she had 2 minor LOB, patient able to stop herself on second occurrence without cues. Also provided cues for safety awareness in a busy environment and attending to unlevel surfaces to look for tripping hazards.  Patient ascended/descended 4-4" steps x2 outside without rails with supervision-CGA. Performed step-to gait pattern. Provided cues for technique and sequencing. Patient became unsteady stepping up the first step on her second trial, instructed her to reach for the rail and lower herself onto the step, provided min A for a controlled descent. Instructed patient on using this technique to prevent falls on the stairs. Also educated patient on signs and symptoms of injury to check for that would indicated it is not safe to get up from a fall and if she does not have any of those signs or symptoms to have someone with her to assist her back to standing.   Neuromuscular Re-ed: Patient performed the following LE motor control activities for increased lateral hip stability with functional mobility activities: -side stepping with B UE support 2x20 feet -B lateral step-up with B UE support 2x5 (challenging for patient) -sit to/from stand from bench with hands on knees x5, focused on gluteal activation for hip extension  Discussed and educated on POC, PT goals, returning home, alcohol cessation, increased physical activity, initiating a routine at home, and focusing on recovery at d/c. Patient provided strategies to avoid alcohol consumption at d/c, including removal of alcohol from her home and car, family and friends not drinking around her, avoiding places that serve/sell drinks, and focusing on enjoyable activities with family. Upgraded standing balance and transfer LTGs due to patient's progress with mobility, see Care Plan.   Following discussion, patient stated, "That's enough for now, you can sit and  watch the rest of this movie with me." When asked if she was willing to continue with therapy, she said that she was tired. Patient missed 10 min of skilled PT due to fatigue, RN made aware. Will attempt to make-up missed time as able.   Patient in recliner in her room at end of session with breaks locked, chair alarm set, and all needs within reach.    Therapy Documentation Precautions:  Precautions Precautions: Fall Restrictions Weight Bearing Restrictions: No General: PT Amount of Missed Time (min): 10 Minutes PT Missed Treatment Reason: Patient fatigue    Therapy/Group: Individual Therapy  Keviana Guida L Ruqaya Strauss PT, DPT  01/12/2020, 12:43 PM

## 2020-01-12 NOTE — Progress Notes (Signed)
Social Work Patient ID: Joann [name], female   DOB: 11/20/1980, 39 y.o.MRN: [id number]   SW spoke with pt husband Celesta Gentile and pt mother Erma to provide updates from team conference, and pt d/c on 01/16/2020. SW explained pt will require 24/7 care due to cognition. Pt mother indicates she is not able to assist beyond the weekend that patient arrives home. Pt called their dtr Shakayla on conference call. Between pt husband and dtr Tomasa Hose they will provide 24/7 care to patient. Tomasa Hose will submit to [place] FMLA forms. Pt husband will take off time to provide care to patient when Rob Bunting has to return to work. Sw informed on recommendation of outpatient PT/OT/ST. Amenable to [facility] as [facility] their home.   SW faxed referral to [facility].    Loralee Pacas, MSW, [place] Office: [phone] Cell: [phone] Fax: [phone]

## 2020-01-12 NOTE — Plan of Care (Signed)
  Problem: RH Problem Solving Goal: LTG Patient will demonstrate problem solving for (SLP) Description: LTG:  Patient will demonstrate problem solving for basic/complex daily situations with cues  (SLP) Flowsheets (Taken 01/12/2020 0647) LTG Patient will demonstrate problem solving for: Moderate Assistance - Patient 50 - 74% Note: Downgraded due to lack of progress and ELOS    Problem: RH Memory Goal: LTG Patient will demonstrate ability for day to day (SLP) Description: LTG:   Patient will demonstrate ability for day to day recall/carryover during cognitive/linguistic activities with assist  (SLP) Flowsheets (Taken 01/12/2020 0647) LTG: Patient will demonstrate ability for day to day recall: Biographical information LTG: Patient will demonstrate ability for day to day recall/carryover during cognitive/linguistic activities with assist (SLP): Moderate Assistance - Patient 50 - 74% Note: Downgraded due to lack of progress and ELOS    Problem: RH Attention Goal: LTG Patient will demonstrate this level of attention during functional activites (SLP) Description: LTG:  Patient will will demonstrate this level of attention during functional activites (SLP) Flowsheets (Taken 01/12/2020 [phone]) Patient will demonstrate during cognitive/linguistic activities the attention type of: Sustained LTG: Patient will demonstrate this level of attention during cognitive/linguistic activities with assistance of (SLP): Minimal Assistance - Patient > 75% Note: Downgraded due to lack of progress and ELOS    Problem: RH Awareness Goal: LTG: Patient will demonstrate awareness during functional activites type of (SLP) Description: LTG: Patient will demonstrate awareness during functional activites type of (SLP) Flowsheets (Taken 01/12/2020 0647) LTG: Patient will demonstrate awareness during cognitive/linguistic activities with assistance of (SLP): Moderate Assistance - Patient 50 - 74% Note: Downgraded due to lack of  progress and ELOS

## 2020-01-12 NOTE — Patient Care Conference (Signed)
Inpatient RehabilitationTeam Conference and Plan of Care Update Date: 01/12/2020   Time: 10:30 AM    Patient Name: Joann [name]      Medical Record Number: [id number]  Date of Birth: 1981/08/12 Sex: Female         Room/Bed: 4W02C/4W02C-01 Payor Info: Payor: MEDICAID [place] / Plan: MEDICAID [place] ACCESS / Product Type: *No Product type* /    Admit Date/Time:  01/08/2020  3:02 PM  Primary Diagnosis:  Hepatic encephalopathy ([place])  Patient Active Problem List   Diagnosis Date Noted  . Reactive depression   . Wernicke's encephalopathy   . Transaminitis   . Steroid-induced hyperglycemia   . Leucocytosis   . Orthostasis   . Macrocytic anemia   . AMS (altered mental status)   . Sleep disturbance   . Pressure injury of skin 01/05/2020  . Alcoholic hepatitis with ascites   . Acute respiratory failure ([place])   . Palliative care encounter   . Elevated bilirubin   . Ascites   . Advanced care planning/counseling discussion   . Goals of care, counseling/discussion   . Palliative care by specialist   . Hypoalbuminemia   . Hepatic encephalopathy ([place]) 12/24/2019  . Alcoholic hepatitis without ascites   . Hypokalemia due to inadequate potassium intake 04/02/2019  . Alcohol withdrawal ([place]) 10/24/2018  . Alcohol dependence with withdrawal, unspecified ([place]) 10/24/2018  . Elevated LFTs 10/24/2018  . Thrombocytopenia ([place]) 10/24/2018  . Alcohol-induced acute pancreatitis   . Acute blood loss anemia   . Alcoholic ketoacidosis 96/28/3662  . Alcohol abuse with alcohol-induced mood disorder ([place]) 09/14/2017  . Elevated liver enzymes 04/29/2017    Expected Discharge Date: Expected Discharge Date: 01/16/20  Team Members Present: Physician leading conference: Dr. Alger Simons Care Coodinator Present: Erlene Quan, BSW;Genie Dyshawn Cangelosi, RN, MSN Nurse Present: Ellison Carwin, LPN PT Present: Apolinar Junes, PT OT Present: Cherylynn Ridges, OT SLP Present: Weston Anna, SLP PPS  Coordinator present : Ileana Ladd, Burna Mortimer, SLP     Current Status/Progress Goal Weekly Team Focus  Bowel/Bladder   Pt is continent/ incont at times. LBM  01/11/20  Pt will be continent of B/B  Q2h toileting.   Swallow/Nutrition/ Hydration             ADL's   CGA/supervision overall  supervision  balance, activity tolerance, safety awareness, dc planning, dc planning, self-care retraining   Mobility   CGA to close supervision overall, gait without AD >250 , 12 stairs with CGA using B rails, [place] 46/56, FGA 15/30, 5TSTS 43 sec on 5/3  Supervision overall  Balance, gait training, dynamic gait, functional mobility, safety awareness, strengthening, patient/caregiver education   Communication             Safety/Cognition/ Behavioral Observations  Max-Total A  Mod A  orientation, basic recall with use of strategies, attention, basic problem solving   Pain   Pt denies having pain at this time.  Pt will be pain free.  Assess pain Qshift/PRN   Skin   Pt has bruising to the bilateral legs  Pt skin will be free of breakdown and infection.  Assess skin Qshift/PRN    Rehab Goals Patient on target to meet rehab goals: Yes *See Care Plan and progress notes for long and short-term goals.     Barriers to Discharge  Current Status/Progress Possible Resolutions Date Resolved   Nursing                  PT  Behavior;Home environment access/layout;Incontinence  Patient with 2 story home, decreased safety awareness and impusivity, new incontinence at night  Need to determine who can provide 24/7 supervision for patient's safety at d/c, balance and functional mobility improving           OT Medical stability                SLP                SW                Discharge Planning/Teaching Needs:  D/c to home with 24/7 care or to her mother's home. Pt husband intends to take off time to help provide care to patient.  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  RN cont B/B, BM 5/3.  OT CGA/S.  PT S transfers, goals S.  SLP confused yesterday, max A yesterday for basic recall, eating with hand.  Mom wants SW to call about DC date.  Team asked about home set up?  Husband will take some time off.  Will need some S for a few weeks at home.   Revisions to Treatment Plan: DC date set now for Saturday 5/8     Medical Summary Current Status: hepatic encephalopathy d/t severe etoh abuse. improving cognition, significant coagulopathy, cirrhosis Weekly Focus/Goal: etoh education. ammonia mgt, nutrition, bp control  Barriers to Discharge: Medical stability   Possible Resolutions to Barriers: substance abuse education. ongoing management of medical issues as per chart   Continued Need for Acute Rehabilitation Level of Care: The patient requires daily medical management by a physician with specialized training in physical medicine and rehabilitation for the following reasons: Direction of a multidisciplinary physical rehabilitation program to maximize functional independence : Yes Medical management of patient stability for increased activity during participation in an intensive rehabilitation regime.: Yes Analysis of laboratory values and/or radiology reports with any subsequent need for medication adjustment and/or medical intervention. : Yes   I attest that I was present, lead the team conference, and concur with the assessment and plan of the team.   Retta Diones 01/12/2020, 7:37 PM   Team conference was held via web/ teleconference due to [place] - 19

## 2020-01-12 NOTE — Consult Note (Signed)
Neuropsychological Consultation   Patient:   Joann [name]   DOB:   1981/08/24  MR Number:  [id number]  Location:  [place] [facility] [place] [id number] [place] [address] Dept: [place]: [phone]           Date of Service:   01/12/2020  Start Time:   1 PM End Time:   2 PM  Provider/Observer:  Ilean Skill, Psy.D.       Clinical Neuropsychologist       Billing Code/Service: 81017  Chief Complaint:    Joann [name] is a 39 year old female with a history of hypertension, alcohol dependence with history of alcoholic pancreatitis [facility] past.  The patient also has a past history of anxiety and had been referred for psychiatric/psychological consultation but these appointments had not been made.  The patient reportedly has failed rehab multiple times for her substance abuse.  The patient presented on 12/23/2019 with altered mental status.  Cranial CT unremarkable for acute intracranial process.  Ultrasound of abdomen showed hepatic steatosis with subtle nodularity of the liver capsule reflecting cirrhosis.  The patient did require initial intubation for airway protection.  The patient was checked out to rule out possible GI bleeds.  Hospital course was complicated by thrombocytopenia secondary to alcohol hepatitis and chronic liver disease.  The patient continues with significant cognitive deficits including expressive aphasia and other cognitive deficits.  The patient was referred to the comprehensive rehabilitation program due to ongoing cognitive difficulties and decreased ability with ADLs.  Reason for Service:  The patient was referred for neuropsychological consultation due to coping and adjustment issues with underlying significant anxiety and depressive response.  Below is the HPI for the current admission.  HPI: Joann [name] is a 39 year old right-handed female with history  of hypertension, alcohol dependence with history of alcoholic pancreatitis seen by [facility] in the past.  She reportedly has failed rehab multiple times. History taken from chart review and daughter due to cognition. Patient lives with spouse and children.  Independent prior to admission.  Two-level home bed and bath on main level. She presented on 12/23/2019 with AMS. Cranial CT unremarkable for acute intracranial process. Ultrasound the abdomen showed hepatic steatosis with subtle nodularity of the liver capsule reflecting cirrhosis.  Minimal abdominal ascites most prominent in the right upper quadrant.  Admission chemistry sodium 128, potassium 2.3, creatinine 1.07, chloride 89, total bilirubin 26.6, AST 143, ammonia 137, alkaline phos 197.  Alcohol level of 13, hemoglobin 9.9, platelets 137,000.  She did require initial intubation for airway protection.  Gastroenterology services consulted for anemia positive guaiac stool and underwent GI endoscopy 12/27/2019 which was unremarkable for bleed. Hospital course further complicated by thrombocytopenia, requiring 1 unit of fresh frozen plasma for coagulopathy thrombocytopenia/anemia with latest platelets 53,000 and hemoglobin 7.0 secondary to alcohol hepatitis and chronic liver disease.  Leukocytosis 16,900 felt to recent steroid-induced.  Maintained on Xifixan for her chronic liver disease as well as course of prednisolone x28 days for her acute alcoholic hepatitis.  Latest chemistries much improved sodium 138, potassium 3.8 ammonia level currently 56 she remains on Chronulac.  Bouts of orthostasis currently maintained on ProAmatine.  Palliative care was consulted to establish goals of care.  Tolerating a regular diet.  Therapy evaluations completed and patient was admitted for a comprehensive rehab program. Please see preadmission assessment from earlier today as well.  Current Status:  The  patient has shown significant improvements in overall cognitive  functioning and she was oriented today and aware to a large degree with what it been going on medically although during her acute phase of her illness she has no memory or recall of those events.  The patient was able to understand what we were talking about and appeared to have adequate receptive language abilities and she was able to effectively communicate.  Her speech pattern was slowed but articulate and without apparent or significant circumlocutions.  The patient was oriented x4 and was able to remember aspects about her recent therapy sessions, diet and food intake and discharge planning and concerns.  The patient acknowledged her significant alcohol use but claims that she has no "taste for it" anymore and claims that she will absolutely not return to drinking.  However, we did directly address that issue and talked about the vulnerabilities for relapse down the road particularly after person starts to feel stronger medically.    Behavioral Observation: Joann [name]  presents as a 39 y.o.-year-old Right African American Female who appeared her stated age. her dress was Appropriate and she was Well Groomed and her manners were Appropriate to the situation.  her participation was indicative of Appropriate and Redirectable behaviors.  There were any physical disabilities noted.  she displayed an appropriate level of cooperation and motivation.     Interactions:    Active Appropriate and Redirectable  Attention:   abnormal and attention span appeared shorter than expected for age  Memory:   abnormal; remote memory intact, recent memory impaired  Visuo-spatial:  not examined  Speech (Volume):  low  Speech:   normal; somewhat slow in response style  Thought Process:  Coherent and Relevant  Though Content:  WNL; not suicidal and not homicidal  Orientation:   person, place, time/date and situation  Judgment:   Fair  Planning:   Poor  Affect:    Anxious and  Depressed  Mood:    Dysphoric  Insight:   Fair  Intelligence:   normal  Substance Use:  There is a documented history of alcohol abuse confirmed by the patient.    Medical History:   Past Medical History:  Diagnosis Date  . Alcohol abuse   . Anxiety   . Hypertension   . Liver disease    Psychiatric History:  The patient has a prior psychiatric history of significant anxiety and had been referred for counseling prior to this most recent medical event but this referral had not taken place.  They are working on getting it established when she is discharged.  Family Med/Psych History:  Family History  Problem Relation Age of Onset  . Healthy Mother   . Alcohol abuse Mother        quit in her 67s  . Alcohol abuse Father   . Diabetes Father   . Healthy Brother   . Hypertension Maternal Grandmother   . Hypertension Maternal Grandfather   . Healthy Brother   . Healthy Brother    Impression/DX:  Joann [name] is a 39 year old female with a history of hypertension, alcohol dependence with history of alcoholic pancreatitis [facility] past.  The patient also has a past history of anxiety and had been referred for psychiatric/psychological consultation but these appointments had not been made.  The patient reportedly has failed rehab multiple times for her substance abuse.  The patient presented on 12/23/2019 with altered mental status.  Cranial CT unremarkable for acute  intracranial process.  Ultrasound of abdomen showed hepatic steatosis with subtle nodularity of the liver capsule reflecting cirrhosis.  The patient did require initial intubation for airway protection.  The patient was checked out to rule out possible GI bleeds.  Hospital course was complicated by thrombocytopenia secondary to alcohol hepatitis and chronic liver disease.  The patient continues with significant cognitive deficits including expressive aphasia and other cognitive deficits.  The patient was  referred to the comprehensive rehabilitation program due to ongoing cognitive difficulties and decreased ability with ADLs.  The patient has shown significant improvements in overall cognitive functioning and she was oriented today and aware to a large degree with what it been going on medically although during her acute phase of her illness she has no memory or recall of those events.  The patient was able to understand what we were talking about and appeared to have adequate receptive language abilities and she was able to effectively communicate.  Her speech pattern was slowed but articulate and without apparent or significant circumlocutions.  The patient was oriented x4 and was able to remember aspects about her recent therapy sessions, diet and food intake and discharge planning and concerns.  The patient acknowledged her significant alcohol use but claims that she has no "taste for it" anymore and claims that she will absolutely not return to drinking.  However, we did directly address that issue and talked about the vulnerabilities for relapse down the road particularly after person starts to feel stronger medically.  Disposition/Plan:  The patient is expecting to be discharged on Saturday.  She does continue to have anxiety that is been exacerbated with her medical illness and her primary mode for treating her anxiety prior was self-medicating with alcohol.  I will see the patient again later this week if time permits.  Diagnosis:    Reactive Depression/anxiety        Electronically Signed   _______________________ Ilean Skill, Psy.D.

## 2020-01-12 NOTE — Plan of Care (Signed)
  Problem: RH Balance Goal: LTG Patient will maintain dynamic standing balance (PT) Description: LTG:  Patient will maintain dynamic standing balance with assistance during mobility activities (PT) Flowsheets (Taken 01/12/2020 1131) LTG: Pt will maintain dynamic standing balance during mobility activities with:: (upgrade goal due to increased balance and strength.) Independent with assistive device  Note: Upgraded goal due to increased balance and strength.   Problem: RH Stairs Goal: LTG Patient will ambulate up and down stairs w/assist (PT) Description: LTG: Patient will ambulate up and down # of stairs with assistance (PT) Flowsheets (Taken 01/12/2020 1131) LTG: Pt will ambulate up/down stairs assist needed:: (Upgraded goal due to increased balance and strength.) Contact Guard/Touching assist LTG: Pt will  ambulate up and down number of stairs: 22 steps with 1 rail for strenthening/endurance and entry to second level of home. Note: Upgraded goal due to increased balance and strength.   Problem: Sit to Stand Goal: LTG:  Patient will perform sit to stand with assistance level (PT) Description: LTG:  Patient will perform sit to stand with assistance level (PT) Flowsheets (Taken 01/12/2020 1137) LTG: PT will perform sit to stand in preparation for functional mobility with assistance level: (Upgraded goal due to increased balance and strength.) Independent with assistive device Note: Upgraded goal due to increased balance and strength.

## 2020-01-12 NOTE — Progress Notes (Signed)
Occupational Therapy Session Note  Patient Details  Name: Joann [name] MRN: [id number] Date of Birth: 1981-03-08  Today's Date: 01/12/2020 OT Individual Time: 2919-1660 OT Individual Time Calculation (min): 43 min    Short Term Goals: Week 1:  OT Short Term Goal 1 (Week 1): Pt will complete transfer off of toilet with Min A for sit<stand OT Short Term Goal 2 (Week 1): Pt will complete LB dressing with Min A OT Short Term Goal 3 (Week 1): Pt will complete 1 grooming task while standing at the sink to improve standing balance  Skilled Therapeutic Interventions/Progress Updates:    Pt greeted seated in recliner and agreeable to OT treatment session. Pt already got dressed during PT session and declined to shower today. Pt requested to complete grooming tasks. OT provided pt with razor and shaving cream. Pt ambulated to the sink with close supervision and shaved chin and eyebrows with supervision and no overt LOB while standing that she could not correct. Pt donned brow liner in standing as well. Pt ambulated into bathroom and transferred onto commode with CGA. Pt voided bladder and completed 3/3 toileting steps with supervision. Pt ambulated to therapy gym without AD and close supervision with intermittent CGA when turning corners. Pt completed UB there-ex with 3 sets of 10 bicep curls, chest press, and straight arm raises using 2 lb dowel rob. Pt ambulated back to room in similar fashion and left seated in recliner with chair alarm on, call bell in reach, and needs met.   Therapy Documentation Precautions:  Precautions Precautions: Fall Restrictions Weight Bearing Restrictions: NoPain:  denies pain  Therapy/Group: Individual Therapy  Valma Cava 01/12/2020, 10:11 AM

## 2020-01-12 NOTE — Progress Notes (Signed)
[facility] PHYSICAL MEDICINE & REHABILITATION PROGRESS NOTE  Subjective/Complaints: Feeling "ok". Anxious to get home. Michela Pitcher that family wanted to know date.   ROS: Patient denies fever, rash, sore throat, blurred vision, nausea, vomiting, diarrhea, cough, shortness of breath or chest pain, joint or back pain, headache, or mood change.   Objective: Vital Signs: Blood pressure (!) 93/49, pulse 74, temperature (!) 97.4 F (36.3 C), temperature source Oral, resp. rate 18, weight 70.8 kg, SpO2 100 %. No results found. Recent Labs    01/11/20 0350  WBC 14.7*  HGB 7.8*  HCT 25.4*  PLT 108*   Recent Labs    01/11/20 0350  NA 138  K 3.5  CL 109  CO2 21*  GLUCOSE 103*  BUN 7  CREATININE 0.52  CALCIUM 8.8*    Physical Exam: BP (!) 93/49 (BP Location: Left Arm) Comment: asymptomatic   Pulse 74   Temp (!) 97.4 F (36.3 C) (Oral)   Resp 18   Wt 70.8 kg   SpO2 100%   BMI 26.79 kg/m  Constitutional: No distress . Vital signs reviewed. HEENT: EOMI, oral membranes moist, icteric sclerae Neck: supple Cardiovascular: RRR without murmur. No JVD    Respiratory/Chest: CTA Bilaterally without wheezes or rales. Normal effort    GI/Abdomen: BS +, non-tender, non-distended Ext: no clubbing, cyanosis, or edema Psych: pleasant but anxious.  Skin: Warm and dry.  Intact. Psych: more focused and on point. Less confabulation Musc: No edema in extremities.  No tenderness in extremities. Neurological: Alert Motor: Grossly 4-4+/5 in all 4 Assessment/Plan: 1. Functional deficits secondary to encephalopathy which require 3+ hours per day of interdisciplinary therapy in a comprehensive inpatient rehab setting.  Physiatrist is providing close team supervision and 24 hour management of active medical problems listed below.  Physiatrist and rehab team continue to assess barriers to discharge/monitor patient progress toward functional and medical goals  Care Tool:  Bathing    Body parts  bathed by patient: Right arm, Left arm, Chest, Abdomen, Front perineal area, Buttocks, Right upper leg, Left upper leg, Left lower leg, Right lower leg, Face   Body parts bathed by helper: Front perineal area, Buttocks     Bathing assist Assist Level: Minimal Assistance - Patient > 75%     Upper Body Dressing/Undressing Upper body dressing   What is the patient wearing?: Pull over shirt    Upper body assist Assist Level: Minimal Assistance - Patient > 75%    Lower Body Dressing/Undressing Lower body dressing      What is the patient wearing?: Incontinence brief     Lower body assist Assist for lower body dressing: Minimal Assistance - Patient > 75%     Toileting Toileting    Toileting assist Assist for toileting: Minimal Assistance - Patient > 75%     Transfers Chair/bed transfer  Transfers assist     Chair/bed transfer assist level: Supervision/Verbal cueing Chair/bed transfer assistive device: Armrests   Locomotion Ambulation   Ambulation assist      Assist level: Supervision/Verbal cueing Assistive device: No Device Max distance: >250'   Walk 10 feet activity   Assist     Assist level: Supervision/Verbal cueing Assistive device: No Device   Walk 50 feet activity   Assist    Assist level: Supervision/Verbal cueing Assistive device: No Device    Walk 150 feet activity   Assist    Assist level: Supervision/Verbal cueing Assistive device: No Device    Walk 10 feet on uneven surface  activity   Assist     Assist level: Moderate Assistance - Patient - 50 - 74%     Wheelchair     Assist Will patient use wheelchair at discharge?: Yes Type of Wheelchair: Manual    Wheelchair assist level: Minimal Assistance - Patient > 75% Max wheelchair distance: 100    Wheelchair 50 feet with 2 turns activity    Assist        Assist Level: Minimal Assistance - Patient > 75%   Wheelchair 150 feet activity     Assist      Assist Level: Moderate Assistance - Patient 50 - 74%      Medical Problem List and Plan: 1.  Decreased functional mobility with altered mental status secondary to Wernicke's versus hepatic encephalopathy related to alcohol abuse.  Continue lactulose as directed as well as prednisolone x28 days total per GI services as well as Xifaxan for chronic liver disease  Continue CIR, team conference today  -called and spoke to husband and mother today at length about dx/prognosis/ELOS 01/16/20 and follow up needs. She will need supervision for safety as well as for substance abuse risk. She's not yet appropriate for inpatient etoh rehab d/t cognition 2.  Antithrombotics: -DVT/anticoagulation: SCDs             -antiplatelet therapy: N/A 3. Pain Management: Tylenol 4. Mood: Trazodone 25 mg nightly             -antipsychotic agents: N/A 5. Neuropsych: This patient is not capable of making decisions on her own behalf. 6. Skin/Wound Care: Routine skin checks 7. Fluids/Electrolytes/Nutrition: Routine in and outs.  8.  Coagulopathy/thrombocytopenia/chronic macrocytic anemia/leukocytosis.  Leukocytosis felt to be related to prednisolone.  Follow-up chemistries.  Transfuse if symptomatic             Hemoglobin 7.0 on 4/30, 7.8 5/3  Platelets 53 on 4/30, up to 108k 5/3  WBCs 16.9 on 4/30, down to 14.7 5/3 9.  Orthostasis.  ProAmatine 3 times daily.  Controlled on 5/3  Monitor with increased mobility 10.  Decreased nutritional storage.  Follow-up dietary services             Supplementation 11.  Steroid-induced hyperglycemia  Sugars now normal 12.  Transaminitis  LFT's stable to improved  5/3  LOS: 4 days A FACE TO [place] 01/12/2020, 11:26 AM

## 2020-01-13 ENCOUNTER — Inpatient Hospital Stay (HOSPITAL_COMMUNITY): Payer: Medicaid Other | Admitting: Occupational Therapy

## 2020-01-13 ENCOUNTER — Inpatient Hospital Stay (HOSPITAL_COMMUNITY): Payer: Medicaid Other

## 2020-01-13 ENCOUNTER — Inpatient Hospital Stay (HOSPITAL_COMMUNITY): Payer: Medicaid Other | Admitting: Speech Pathology

## 2020-01-13 NOTE — Progress Notes (Signed)
Speech Language Pathology Daily Session Note  Patient Details  Name: Joann [name] MRN: [id number] Date of Birth: 1981/01/17  Today's Date: 01/13/2020 SLP Individual Time: 1400-1500 SLP Individual Time Calculation (min): 60 min  Short Term Goals: Week 1: SLP Short Term Goal 1 (Week 1): Patient will utilize external aids as trained and implemented in therapy to enhance functional cognitive-linguistic skills with 80% accuracy and mod cues. SLP Short Term Goal 2 (Week 1): Patient will demonstrate sustained attention to task for 15 minutes with mod verbal cues for redirection. SLP Short Term Goal 3 (Week 1): Patient will complete delayed recall tasks for 5 stimuli post ~5 minute delay with 80% accuracy and mod cues. SLP Short Term Goal 4 (Week 1): Patient will complete functional problem solving scenarios with 80% accuracy and mod cues.  Skilled Therapeutic Interventions: Skilled treatment session focused on cognitive goals. SLP facilitated session by providing extra time and overall Mod A verbal cues for organization and specificity during a mildly complex appointment/scheduling task. Patient requested to ambulate to the bathroom by herself due to urgency from current medications. After discussion with the team, it was decided the patient can use the [facility] independently with more frequent checks from nursing. Patient verbalized understanding and agreement. Patient left sitting on BSC with all needs within reach. Continue with current plan of care.      Pain No/Denies Pain   Therapy/Group: Individual Therapy  Krystal Teachey, [place] 01/13/2020, 3:13 PM

## 2020-01-13 NOTE — Progress Notes (Signed)
Physical Therapy Session Note  Patient Details  Name: Joann [name] MRN: [id number] Date of Birth: 21-May-1981  Today's Date: 01/13/2020 PT Individual Time: 7579-7282 PT Individual Time Calculation (min): 71 min   Short Term Goals: Week 1:  PT Short Term Goal 1 (Week 1): STG=LTG  Skilled Therapeutic Interventions/Progress Updates:     Patient sitting EOB with TED hose donned and putting on shoes upon PT arrival. Patient alert and agreeable to PT session. Patient denied pain during session. Reported increased lateral hip and LE soreness due to increased gait training in therapy yesterday and exercises she performed in the bed throughout last night. Educated patient on effects of muscle fatigue and soreness following overexertion and instructed patient not perform exercises outside of therapy tonight and PT to provide HEP at d/c with instructions for frequency and intensity to promote strengthening without affecting mobility. Patient stated understanding. She required increased assist with gait and stair training and increased rest breaks due to LE soreness/fatigue this session.    Therapeutic Activity: Transfers: Patient performed sit to/from stand independently in the room with use of UEs from various surfaces throughout session  Gait Training:  Patient ambulated >200 ft x2 and ~100 feet without an AD with CGA-close supervision. Ambulated with increased trendelenburg gait and intermittent R toe drag resulting in minor LOB, patient able to self correct with CGA. Provided verbal cues for attending to gait in busy environments, increased step height, and intentional stepping with decreased speed as needed. Patient ascended/descended 9 steps using R rail with B UE support with CGA-min A for stabilizing and boosting up intermittently. Performed step-to gait pattern leading with L while ascending and R while descending. Provided cues for technique and sequencing.   Neuromuscular Re-ed: Patient performed  the following dynamic standing balance activities focused on UE and LE coordination, cognition for following cues and recall of cues to complete task, and engagement in leisure activities: [employer]: patient set up game, as she is familiar with using the wii, required min-mod cues and increased time, she then played one game of bowling requiring min cues for controls and instructed patient to perform 3 steps into release to simulate live bowling and work on dynamic balance. She required max cues for performing stepping rather than swinging alone and required close supervision for balance and CGA x1 for minor LOB. Score: 134  Patient was interested in painting a rock for the [facility] and Stroke Walk. Educated patient on the purpose of the project, she was very interested in participating in an event like this. Discussed options for volunteer opportunities and community involvement to engage in healthy leasure and social behaviors. As she painted her rock she also discussed stressors at home and work and PT provided education about stress management and utilizing strategies that work for her.   Patient sitting EOB at end of session with breaks locked, bed alarm set, and all needs within reach.    Therapy Documentation Precautions:  Precautions Precautions: Fall Restrictions Weight Bearing Restrictions: No    Therapy/Group: Individual Therapy  Donshay Lupinski L Halcyon Heck PT, DPT  01/13/2020, 4:38 PM

## 2020-01-13 NOTE — Progress Notes (Signed)
Social Work Patient ID: Joann [name], female   DOB: [birth year], 39 y.o.   MRN: [id number]   SW called pt husband Joann [name] to inform on packet left in patient's room with SSA kit explaining how to apply for SSDI; certification form; and instructions to [facility]/appointments scheduled  (5/10 10:15am PT eval; 5/10 11am ST eval; 5/18 OT eval). SW informed this SW out TR/Fri and co-workers available if needed.   Loralee Pacas, MSW, [place] Office: [phone] Cell: [phone] Fax: [phone]

## 2020-01-13 NOTE — Progress Notes (Signed)
Occupational Therapy Session Note  Patient Details  Name: Joann [name] MRN: [id number] Date of Birth: 12/05/1980  Today's Date: 01/13/2020 OT Individual Time: 0801-0915 OT Individual Time Calculation (min): 74 min    Short Term Goals: Week 1:  OT Short Term Goal 1 (Week 1): Pt will complete transfer off of toilet with Min A for sit<stand OT Short Term Goal 2 (Week 1): Pt will complete LB dressing with Min A OT Short Term Goal 3 (Week 1): Pt will complete 1 grooming task while standing at the sink to improve standing balance  Skilled Therapeutic Interventions/Progress Updates:  Patient met sitting in bed in agreement with OT treatment session with focus on household mobility, self-care re-education and IADLs as detailed below. Sit to stand from EOB with supervision A without use of AE and functional mobility to gather ADL items with supervision A in prep for shower. Patient demonstrating deficits in functional organization with selecting multiple clothing items without recalling what she'd already gathered. Patient completed UB/LB bathing/dressing in sitting/standing with supervision A and use of R grab bar. Patient reports completing oral hygiene prior to OT's arrival this A.M. Light housekeeping task in room with patient able to make bed with close supervision A. Session concluded with patient sitting in bed with call bell within reach, bed alarm activated, and all needs met.   Therapy Documentation Precautions:  Precautions Precautions: Fall Restrictions Weight Bearing Restrictions: No  Therapy/Group: Individual Therapy   R Howerton-Davis 01/13/2020, 7:52 AM  

## 2020-01-13 NOTE — Progress Notes (Addendum)
PHYSICAL MEDICINE & REHABILITATION PROGRESS NOTE  Subjective/Complaints: In good spirits. Excited about going home. "my mom will be with me"  ROS: Limited due to cognitive/behavioral      Objective: Vital Signs: Blood pressure 109/65, pulse 79, temperature 98.2 F (36.8 C), temperature source Oral, resp. rate 18, weight 68.1 kg, SpO2 100 %. No results found. Recent Labs    01/11/20 0350  WBC 14.7*  HGB 7.8*  HCT 25.4*  PLT 108*   Recent Labs    01/11/20 0350  NA 138  K 3.5  CL 109  CO2 21*  GLUCOSE 103*  BUN 7  CREATININE 0.52  CALCIUM 8.8*    Physical Exam: BP 109/65 (BP Location: Left Arm)   Pulse 79   Temp 98.2 F (36.8 C) (Oral)   Resp 18   Wt 68.1 kg   SpO2 100%   BMI 25.77 kg/m  Constitutional: No distress . Vital signs reviewed. HEENT: EOMI, oral membranes moist Neck: supple Cardiovascular: RRR without murmur. No JVD    Respiratory/Chest: CTA Bilaterally without wheezes or rales. Normal effort    GI/Abdomen: BS +, non-tender, non-distended Ext: no clubbing, cyanosis, or edema Psych: pleasant but anxious Skin: Warm and dry.  Intact. Psych: more focused and on point. Less confabulation. Decreased insight Musc: No edema in extremities.  No tenderness in extremities. Neurological: Alert Motor: Grossly 4+/5 in all 4 Assessment/Plan: 1. Functional deficits secondary to encephalopathy which require 3+ hours per day of interdisciplinary therapy in a comprehensive inpatient rehab setting.  Physiatrist is providing close team supervision and 24 hour management of active medical problems listed below.  Physiatrist and rehab team continue to assess barriers to discharge/monitor patient progress toward functional and medical goals  Care Tool:  Bathing    Body parts bathed by patient: Right arm, Left arm, Chest, Abdomen, Front perineal area, Buttocks, Right upper leg, Left upper leg, Left lower leg, Right lower leg, Face   Body parts bathed  by helper: Front perineal area, Buttocks     Bathing assist Assist Level: Minimal Assistance - Patient > 75%     Upper Body Dressing/Undressing Upper body dressing   What is the patient wearing?: Pull over shirt    Upper body assist Assist Level: Minimal Assistance - Patient > 75%    Lower Body Dressing/Undressing Lower body dressing      What is the patient wearing?: Incontinence brief, Pants     Lower body assist Assist for lower body dressing: Contact Guard/Touching assist     Toileting Toileting    Toileting assist Assist for toileting: Minimal Assistance - Patient > 75%     Transfers Chair/bed transfer  Transfers assist     Chair/bed transfer assist level: Supervision/Verbal cueing Chair/bed transfer assistive device: Armrests   Locomotion Ambulation   Ambulation assist      Assist level: Supervision/Verbal cueing Assistive device: No Device Max distance: >200 ft   Walk 10 feet activity   Assist     Assist level: Supervision/Verbal cueing Assistive device: No Device   Walk 50 feet activity   Assist    Assist level: Supervision/Verbal cueing Assistive device: No Device    Walk 150 feet activity   Assist    Assist level: Supervision/Verbal cueing Assistive device: No Device    Walk 10 feet on uneven surface  activity   Assist     Assist level: Contact Guard/Touching assist     Wheelchair     Assist Will patient use wheelchair at  discharge?: Yes Type of Wheelchair: Manual    Wheelchair assist level: Minimal Assistance - Patient > 75% Max wheelchair distance: 100    Wheelchair 50 feet with 2 turns activity    Assist        Assist Level: Minimal Assistance - Patient > 75%   Wheelchair 150 feet activity     Assist     Assist Level: Moderate Assistance - Patient 50 - 74%      Medical Problem List and Plan: 1.  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  Continue lactulose as directed as well as prednisolone x28 days total per GI services as well as Xifaxan for chronic liver disease  Continue CIR, team conference today  -team finalizing dispo planning. Mother was non-committal to say the least when we spoke yesterday 2.  Antithrombotics: -DVT/anticoagulation: SCDs             -antiplatelet therapy: N/A 3. Pain Management: Tylenol 4. Mood: Trazodone 25 mg nightly             -antipsychotic agents: N/A 5. Neuropsych: This patient is not capable of making decisions on her own behalf. 6. Skin/Wound Care: Routine skin checks 7. Fluids/Electrolytes/Nutrition: Routine in and outs.  8.  Coagulopathy/thrombocytopenia/chronic macrocytic anemia/leukocytosis.  Leukocytosis felt to be related to prednisolone.  Follow-up chemistries.  Transfuse if symptomatic             Hemoglobin 7.0 on 4/30, 7.8 5/3  Platelets 53 on 4/30, up to 108k 5/3  WBCs 16.9 on 4/30, down to 14.7 5/3  -checking cbc again 5/6  -continue IV for now 9.  Orthostasis.  ProAmatine 3 times daily.  Controlled on 5/5  Monitor with increased mobility 10.  Decreased nutritional storage.  Follow-up dietary services             Supplementation 11.  Steroid-induced hyperglycemia  Sugars now normal 12.  Transaminitis  LFT's stable to improved  5/3----recheck 5/6  LOS: 5 days A FACE TO [place] 01/13/2020, 8:20 AM

## 2020-01-13 NOTE — Plan of Care (Signed)
  Problem: Consults Goal: RH BRAIN INJURY PATIENT EDUCATION Description: Description: See Patient Education module for eduction specifics Outcome: Progressing   Problem: RH BOWEL ELIMINATION Goal: RH STG MANAGE BOWEL WITH ASSISTANCE Description: STG Manage Bowel with [place]. Outcome: Progressing   Problem: RH BLADDER ELIMINATION Goal: RH STG MANAGE BLADDER WITH ASSISTANCE Description: STG Manage Bladder With Min Assistance Outcome: Progressing Goal: RH STG MANAGE BLADDER WITH MEDICATION WITH ASSISTANCE Description: STG Manage Bladder With Medication With [place]. Outcome: Progressing   Problem: RH SKIN INTEGRITY Goal: RH STG SKIN FREE OF INFECTION/BREAKDOWN Description: No new breakdown with min assist  Outcome: Progressing Goal: RH STG ABLE TO PERFORM INCISION/WOUND CARE W/ASSISTANCE Description: STG Able To Perform Incision/Wound Care With [employer]. Outcome: Progressing   Problem: RH SAFETY Goal: RH STG ADHERE TO SAFETY PRECAUTIONS W/ASSISTANCE/DEVICE Description: STG Adhere to Safety Precautions With Min Assistance/Device. Outcome: Progressing   Problem: RH PAIN MANAGEMENT Goal: RH STG PAIN MANAGED AT OR BELOW PT'S PAIN GOAL Description: < 3 out of 10.  Outcome: Progressing

## 2020-01-14 ENCOUNTER — Inpatient Hospital Stay (HOSPITAL_COMMUNITY): Payer: Medicaid Other | Admitting: Speech Pathology

## 2020-01-14 ENCOUNTER — Inpatient Hospital Stay (HOSPITAL_COMMUNITY): Payer: Medicaid Other | Admitting: Occupational Therapy

## 2020-01-14 ENCOUNTER — Inpatient Hospital Stay (HOSPITAL_COMMUNITY): Payer: Medicaid Other

## 2020-01-14 LAB — COMPREHENSIVE METABOLIC PANEL
ALT: 61 U/L — ABNORMAL HIGH (ref 0–44)
AST: 108 U/L — ABNORMAL HIGH (ref 15–41)
Albumin: 2.1 g/dL — ABNORMAL LOW (ref 3.5–5.0)
Alkaline Phosphatase: 143 U/L — ABNORMAL HIGH (ref 38–126)
Anion gap: 8 (ref 5–15)
BUN: 9 mg/dL (ref 6–20)
CO2: 20 mmol/L — ABNORMAL LOW (ref 22–32)
Calcium: 8.4 mg/dL — ABNORMAL LOW (ref 8.9–10.3)
Chloride: 109 mmol/L (ref 98–111)
Creatinine, Ser: 0.52 mg/dL (ref 0.44–1.00)
GFR calc Af Amer: >60 mL/min (ref >60)
GFR calc non Af Amer: >60 mL/min (ref >60)
Glucose, Bld: 97 mg/dL (ref 70–99)
Potassium: 3.1 mmol/L — ABNORMAL LOW (ref 3.5–5.1)
Sodium: 137 mmol/L (ref 135–145)
Total Bilirubin: 6.3 mg/dL — ABNORMAL HIGH (ref 0.3–1.2)
Total Protein: 6.1 g/dL — ABNORMAL LOW (ref 6.5–8.1)

## 2020-01-14 LAB — AMMONIA: Ammonia: 89 umol/L — ABNORMAL HIGH (ref 9–35)

## 2020-01-14 LAB — CBC
HCT: 24 % — ABNORMAL LOW (ref 36.0–46.0)
Hemoglobin: 7.3 g/dL — ABNORMAL LOW (ref 12.0–15.0)
MCH: 34.1 pg — ABNORMAL HIGH (ref 26.0–34.0)
MCHC: 30.4 g/dL (ref 30.0–36.0)
MCV: 112.1 fL — ABNORMAL HIGH (ref 80.0–100.0)
Platelets: 80 10*3/uL — ABNORMAL LOW (ref 150–400)
RBC: 2.14 MIL/uL — ABNORMAL LOW (ref 3.87–5.11)
RDW: 21.2 % — ABNORMAL HIGH (ref 11.5–15.5)
WBC: 9.9 10*3/uL (ref 4.0–10.5)
nRBC: 0 % (ref 0.0–0.2)

## 2020-01-14 MED ORDER — ZINC SULFATE 220 (50 ZN) MG PO CAPS: 220.0000 mg | ORAL_CAPSULE | Freq: Every day | ORAL | 0 refills | Status: DC

## 2020-01-14 MED ORDER — PREDNISOLONE 5 MG PO TABS: 40.0000 mg | ORAL_TABLET | Freq: Every day | ORAL | 0 refills | Status: DC

## 2020-01-14 MED ORDER — PANTOPRAZOLE SODIUM 40 MG PO TBEC: 40.0000 mg | DELAYED_RELEASE_TABLET | Freq: Every day | ORAL | 0 refills | Status: DC

## 2020-01-14 MED ORDER — TRAZODONE HCL 50 MG PO TABS: 25.0000 mg | ORAL_TABLET | Freq: Every day | ORAL | 0 refills | Status: DC

## 2020-01-14 MED ORDER — MIDODRINE HCL 5 MG PO TABS: 5.0000 mg | ORAL_TABLET | Freq: Three times a day (TID) | ORAL | 0 refills | Status: DC

## 2020-01-14 MED ORDER — RIFAXIMIN 550 MG PO TABS: 550.0000 mg | ORAL_TABLET | Freq: Two times a day (BID) | ORAL | 0 refills | Status: DC

## 2020-01-14 MED ORDER — VENLAFAXINE HCL 75 MG PO TABS: 75.0000 mg | ORAL_TABLET | Freq: Every day | ORAL | 0 refills | Status: DC

## 2020-01-14 MED ORDER — LACTULOSE 10 GM/15ML PO SOLN: 20.0000 g | Freq: Three times a day (TID) | ORAL | 0 refills | Status: DC

## 2020-01-14 MED ORDER — ADULT MULTIVITAMIN W/MINERALS CH: 1.0000 | ORAL_TABLET | Freq: Every day | ORAL | Status: DC

## 2020-01-14 MED ORDER — FOLIC ACID 1 MG PO TABS: 1.0000 mg | ORAL_TABLET | Freq: Every day | ORAL | 0 refills | Status: DC

## 2020-01-14 NOTE — Discharge Summary (Signed)
Physician Discharge Summary  Patient ID: Joann [name] MRN: [id number] DOB/AGE: 02/18/1981 39 y.o.  Admit date: 01/08/2020 Discharge date: 01/16/2020  Discharge Diagnoses:  Principal Problem:   Hepatic encephalopathy ([place]) Active Problems:   Wernicke's encephalopathy   Transaminitis   Steroid-induced hyperglycemia   Leucocytosis   Reactive depression DVT prophylaxis Thrombocytopenia/microcytic anemia Alcohol dependence Alcoholic pancreatitis  Discharged Condition: Stable  Significant Diagnostic Studies: DG Chest 1 View  Result Date: 12/25/2019 CLINICAL DATA:  Leukocytosis EXAM: CHEST  1 VIEW COMPARISON:  04/02/2019 FINDINGS: Cardiac shadow is at the upper limits of normal in size. The lungs are hypoinflated with bibasilar atelectasis left greater than right. No pneumothorax or sizable effusion is seen. No bony abnormality is noted. IMPRESSION: Bibasilar atelectasis as described. Electronically Signed   By: Inez Catalina M.D.   On: 12/25/2019 15:16   CT HEAD WO CONTRAST  Result Date: 12/30/2019 CLINICAL DATA:  39 year old female with altered mental status. EXAM: CT HEAD WITHOUT CONTRAST TECHNIQUE: Contiguous axial images were obtained from the base of the skull through the vertex without intravenous contrast. COMPARISON:  Head CT dated 12/24/2019. FINDINGS: Brain: The ventricles and sulci appropriate size for patient's age. The gray-white matter discrimination is preserved. There is no acute intracranial hemorrhage. No mass effect or midline shift. No extra-axial fluid collection. Vascular: No hyperdense vessel or unexpected calcification. Skull: Normal. Negative for fracture or focal lesion. Sinuses/Orbits: The visualized paranasal sinuses and mastoid air cells are clear. Partially visualized nasal tube. Other: None IMPRESSION: Unremarkable noncontrast CT of the brain. Electronically Signed   By: Anner Crete M.D.   On: 12/30/2019 18:52   CT Head Wo Contrast  Result Date:  12/24/2019 CLINICAL DATA:  39 year old female with history of head trauma. Altered mental status. EXAM: CT HEAD WITHOUT CONTRAST TECHNIQUE: Contiguous axial images were obtained from the base of the skull through the vertex without intravenous contrast. COMPARISON:  Head CT 11/21/2018. FINDINGS: Brain: No evidence of acute infarction, hemorrhage, hydrocephalus, extra-axial collection or mass lesion/mass effect. Vascular: No hyperdense vessel or unexpected calcification. Skull: Normal. Negative for fracture or focal lesion. Sinuses/Orbits: No acute finding. Other: None. IMPRESSION: 1. No acute intracranial abnormalities. The appearance of the brain is normal. Electronically Signed   By: Vinnie Langton M.D.   On: 12/24/2019 06:33   US Abdomen Limited  Result Date: 12/28/2019 CLINICAL DATA:  Ascites.  History of alcohol abuse. EXAM: LIMITED ABDOMEN ULTRASOUND FOR ASCITES TECHNIQUE: Limited ultrasound survey for ascites was performed in all four abdominal quadrants. COMPARISON:  Right upper quadrant ultrasound 12/23/2019, pelvic ultrasound 12/26/2019 FINDINGS: All 4 quadrants were scanned for ascites. Small volume ascites in the right upper quadrant, trace free fluid in the other quadrants. IMPRESSION: Minimal abdominal ascites most prominent in the right upper quadrant. Electronically Signed   By: Keith Rake M.D.   On: 12/28/2019 19:47   DG Chest Port 1 View  Result Date: 01/02/2020 CLINICAL DATA:  39 year old female with history of acute respiratory failure. EXAM: PORTABLE CHEST 1 VIEW COMPARISON:  Chest x-ray 12/31/2019. FINDINGS: An endotracheal tube is in place with tip 2.8 cm above the carina. A feeding tube is seen extending into the abdomen, however, the tip of the feeding tube extends below the lower margin of the image. Lung volumes are low. Left basilar opacity may reflect areas of atelectasis and/or consolidation. Small bilateral pleural effusions. No evidence of pulmonary edema. Heart size  is normal. Upper mediastinal contours are within normal limits. IMPRESSION: 1. Support apparatus, as above. 2. Low  lung volumes with atelectasis and/or consolidation in the left lower lobe. 3. Small bilateral pleural effusions. Electronically Signed   By: Vinnie Langton M.D.   On: 01/02/2020 08:26   DG CHEST PORT 1 VIEW  Result Date: 12/31/2019 CLINICAL DATA:  Intubation. EXAM: PORTABLE CHEST 1 VIEW COMPARISON:  12/30/2019. FINDINGS: Endotracheal tube tip 5 mm above the lower portion of the carina. Proximal repositioning of approximately 2 cm should be considered. Feeding tube in stable position. Heart size normal. Progressive bibasilar atelectasis/infiltrates. Bilateral interstitial prominence again noted suggesting interstitial edema and/or pneumonitis. No pleural effusion or pneumothorax. No acute bony abnormality identified. IMPRESSION: 1. Endotracheal tube tip 5 mm above the lower portion of the carina. Proximal repositioning of approximately 2 cm should be considered. Feeding tube in stable position. 2. Progressive bibasilar atelectasis/infiltrates. Bilateral interstitial prominence again noted suggesting interstitial edema and/or pneumonitis. These results will be called to the ordering clinician or representative by the Radiologist Assistant, and communication documented in the PACS or [employer]. Electronically Signed   By: Marcello Moores  Register   On: 12/31/2019 06:31   DG Chest Port 1V same Day  Result Date: 12/30/2019 CLINICAL DATA:  39 year old female with altered mental status. EXAM: PORTABLE CHEST 1 VIEW COMPARISON:  Chest radiograph dated 12/25/2019. FINDINGS: Feeding tube with tip to the right of the spine in the distal stomach. Overall improved aeration of the lungs compared to the prior radiograph. Mild interstitial nodularity. No focal consolidation, pleural effusion, pneumothorax. Six 60 stable cardiac silhouette. No acute osseous pathology. IMPRESSION: 1. No focal consolidation. 2.  Feeding tube in the distal stomach. Electronically Signed   By: Anner Crete M.D.   On: 12/30/2019 18:30   EEG adult  Result Date: 12/31/2019 Lora Havens, MD     12/31/2019  9:47 AM Patient Name: Joann [name] MRN: [id number] Epilepsy Attending: Lora Havens Referring Physician/Provider: Dr Oren Binet Date: 12/30/2019 Duration: 22.25 mins Patient history: 39 yo F with h/o etoh dependence and seizures admitted for ams. EEG to evaluate for seizure.  Level of alertness: lethargic AEDs during EEG study: None Technical aspects: This EEG study was done with scalp electrodes positioned according to the 10-20 International system of electrode placement. Electrical activity was acquired at a sampling rate of 500Hz  and reviewed with a high frequency filter of 70Hz  and a low frequency filter of 1Hz . EEG data were recorded continuously and digitally stored. DESCRIPTION: EEG showed continuous generalized low amplitude 2-3hz  delta slowing. Hyperventilation and photic stimulation were not performed. EEG was technically difficult due to significant myogenic artifact. ABNORMALITY - Continuous slow, generalized IMPRESSION: This technically difficult study is suggestive of severe diffuse encephalopathy, non specific to etiology. No seizures or epileptiform discharges were seen throughout the recording. Priyanka Barbra Sarks   US PELVIC COMPLETE WITH TRANSVAGINAL  Result Date: 12/26/2019 CLINICAL DATA:  Menorrhagia.  Low hemoglobin. EXAM: TRANSABDOMINAL AND TRANSVAGINAL ULTRASOUND OF PELVIS TECHNIQUE: Both transabdominal and transvaginal ultrasound examinations of the pelvis were performed. Transabdominal technique was performed for global imaging of the pelvis including uterus, ovaries, adnexal regions, and pelvic cul-de-sac. It was necessary to proceed with endovaginal exam following the transabdominal exam to visualize the ovaries and better visualize the uterus and endometrium. COMPARISON:  None FINDINGS: Uterus  Measurements: 7.2 x 5.0 x 3.5 cm = volume: 65 mL. No fibroids or other mass visualized. Endometrium Thickness: 7 mm.  Poorly visualized.  No focal abnormality seen. Right ovary Not visualized. Left ovary Not visualized. Other findings Small amount of free peritoneal  fluid. IMPRESSION: 1. Normal appearing uterus with the exception of poor visualization of the endometrium. 2. Small amount of free peritoneal fluid. 3. Nonvisualized ovaries. Electronically Signed   By: Claudie Revering M.D.   On: 12/26/2019 19:24   US Abdomen Limited RUQ  Result Date: 12/23/2019 CLINICAL DATA:  Elevated liver function tests EXAM: ULTRASOUND ABDOMEN LIMITED RIGHT UPPER QUADRANT COMPARISON:  07/06/2019, 08/29/2018 FINDINGS: Gallbladder: Surgically absent Common bile duct: Diameter: 6 mm Liver: Heterogeneous increased liver echotexture consistent with hepatic steatosis. Slight nodularity of the liver capsule may reflect underlying cirrhosis. No focal liver abnormalities. Portal vein not well visualized due to patient cooperation. Other: None. IMPRESSION: 1. Cholecystectomy. 2. Hepatic steatosis, with subtle nodularity of the liver capsule which may reflect developing cirrhosis. Electronically Signed   By: Randa Ngo M.D.   On: 12/23/2019 23:29    Labs:  Basic Metabolic Panel: Recent Labs  Lab 01/11/20 0350 01/14/20 0518  NA 138 137  K 3.5 3.1*  CL 109 109  CO2 21* 20*  GLUCOSE 103* 97  BUN 7 9  CREATININE 0.52 0.52  CALCIUM 8.8* 8.4*    CBC: Recent Labs  Lab 01/11/20 0350 01/14/20 0518  WBC 14.7* 9.9  NEUTROABS 12.6*  --   HGB 7.8* 7.3*  HCT 25.4* 24.0*  MCV 113.4* 112.1*  PLT 108* 80*    CBG: Recent Labs  Lab 01/08/20 0813 01/08/20 1201 01/08/20 1719  GLUCAP 113* 110* 111*   Family history.  Mother with alcohol abuse Father with diabetes.  Maternal grandmother with hypertension.  Denies any hyperlipidemia esophageal or rectal cancer  Brief HPI:   Joann [name] is a 39 y.o. right-handed female  with history of hypertension, alcohol dependence with history of alcohol pancreatitis seen by [facility] in the past.  She reportedly failed rehab multiple times.  History taken from chart review and daughter due to cognition.  Patient lives with spouse and children independent prior to admission.  Two-level home bed and bath main level.  Presented 12/23/2019 with altered mental status.  Cranial CT scan unremarkable for acute process.  Ultrasound of the abdomen showed hepatic steatosis with subtle nodularity of the liver capsule reflecting cirrhosis.  Minimal abdominal ascites most prominent in the right upper quadrant.  Admission chemistry sodium 128 potassium 2.3 creatinine 1.07 chloride 89 total bilirubin 26.6 AST 143 ammonia 137 alkaline phosphatase 197.  Alcohol level of 11 hemoglobin 9.9 platelet 137,000.  She did require initial intubation for airway protection.  Gastroenterology services consulted for anemia positive guaiac stool underwent GI endoscopy 12/27/2019 which was unremarkable for bleed.  Hospital course further complicated by thrombocytopenia requiring 1 unit of fresh frozen plasma for coagulopathy thrombocytopenia anemia with latest platelets 53,000 and hemoglobin of 7 secondary to alcoholic hepatitis and chronic liver disease.  Leukocytosis 16,900 felt to be due to recent steroid induced.  Maintained on Xifixan for her chronic liver disease as well as a course of prednisone x28 days for her acute alcoholic hepatitis.  Latest chemistries much improved sodium 138 potassium 3.8 ammonia level 56 she remained on Chronulac.  Bouts of orthostasis she did require ProAmatine.  Palliative care consulted to establish goals of care.  Her diet was advanced to regular.  Patient was admitted for a comprehensive rehab program   Hospital Course: Joann [name] was admitted to rehab 01/08/2020 for inpatient therapies to consist of PT, ST and OT at least three hours five days a week. Past admission physiatrist,  therapy team and rehab RN have  worked together to provide customized collaborative inpatient rehab.  Pertaining to patient's altered mental status secondary to [facility] versus hepatic encephalopathy related to alcohol use she continued to participate with therapies lactulose as directed with prednisolone x28 days total per GI services as well as Xifaxan for chronic liver disease.  SCDs for DVT prophylaxis.  She did require some trazodone to help aid in her sleep.  Coagulopathy thrombocytopenia chronic macrocytic anemia leukocytosis felt to be related to prednisolone for her leukocytosis latest hemoglobin maintained at 7.3 hematocrit 24 platelets 80,000.  Her latest ammonia level of 56.  Monitoring of liver function AST 108 ALT 61 alkaline phosphatase 143 total bilirubin 6.3.  Blood pressures controlled she did require ProAmatine.   Blood pressures were monitored on TID basis and soft and monitored  She is continent of bowel and bladder.  She has made gains during rehab stay and is attending therapies  Joann [name] will continue to receive follow up therapies   after discharge  Rehab course: During patient's stay in rehab weekly team conferences were held to monitor patient's progress, set goals and discuss barriers to discharge. At admission, patient required min mod assist 100 feet rolling walker moderate assist sit stand minimal assist supine to sit.  Moderate assist upper body bathing moderate assist lower body bathing moderate assist upper body dressing max is lower body dressing moderate assist toilet transfers  Physical exam.  Blood pressure 108/69 pulse 86 temperature 98.5 respirations 18 oxygen saturation 98% room air Constitutional well-developed well-nourished HEENT Head.  Normocephalic and atraumatic Eyes.  EOMs normal right eye left eye exhibits no discharge scleral icterus present Neck.  Supple nontender no JVD without thyromegaly Cardiac regular rate rhythm without any extra sounds or  murmur heard Respiratory effort normal no respiratory distress without wheeze Abdomen.  Soft nontender positive bowel sounds without rebound Musculoskeletal.  Comments.  No edema or tenderness in extremities Neurological.  Alert makes good eye contact with examiner some delay in processing for appropriate place.  She does have some decreased awareness of her deficits Motor.  Grossly 4+/5 throughout  /She  has had improvement in activity tolerance, balance, postural control as well as ability to compensate for deficits. Joann [name] has had improvement in functional use RUE/LUE  and RLE/LLE as well as improvement in awareness.  Working with energy conservation techniques.  Ambulates 200 feet x 2 without assistive device contact-guard to close supervision.  Ambulated with increased Trendelenburg gait and intermittent right toe drag resulting in minor loss of balance.  Patient able to self-correct with contact-guard assist.  Perform sit to stand independently.  Occupational therapy sessions focused on household mobility self-care family teaching.  Sit to stand edge of bed supervision.  Able to gather ADL items with supervision.  Demonstrated some deficits in functional organization with selecting multiple clothing items without recalling what she is already gathered.  Completed upper lower body dressing sitting standing with supervision.  Speech therapy facilitated sessions by providing extra time and overall moderate verbal cues for organization and specificity during mild complex appointment scheduling task.  It was discussed with family need for supervision for safety.  Family teaching completed and discharged home       Disposition: Discharged to home    Diet: Regular  Special Instructions: No driving smoking or alcohol  Medications at discharge 1.  Tylenol as needed 2.  Folic acid 1 mg p.o. daily 3.  Chronulac 20 mg p.o. every 8 hours 4.  ProAmatine 5 mg p.o. 3 times daily 5.  Multivitamin  daily 6.  Protonix 40 mg p.o. daily 7.  Prednisolone 40 mg p.o. daily to complete course 8.Xifaxan 550 mg p.o. twice daily 9.  Trazodone 25 mg p.o. nightly 10.  Zinc sulfate 220 mg p.o. daily 11.Effexor 75 mg daily  30-35 minutes were spent completing discharge summary and discharge planning  Discharge Instructions    Ambulatory referral to Occupational Therapy   Complete by: As directed    Evaluate and treat   Ambulatory referral to Physical Therapy   Complete by: As directed    Evaluate and treat   Ambulatory referral to Speech Therapy   Complete by: As directed    Evaluate and treat      Follow-up Information    [name], MD Follow up.   Specialty: Physical Medicine and Rehabilitation Why: Office to call for appointment Contact information: [address] [place] [address] [phone]        Mansouraty, Telford Nab., MD Follow up.   Specialties: Gastroenterology, Internal Medicine Why: Call for appointment Contact information: [place] [address] [phone]        Mansouraty, Telford Nab., MD .   Specialties: Gastroenterology, Internal Medicine Contact information: [address] [place] [address] [phone]           Signed: Lavon Paganini [place] 01/15/2020, 5:12 AM

## 2020-01-14 NOTE — Progress Notes (Signed)
NT walked into room to take pt to bathroom for timed toileting , pt was up walking in room w/o device, husband in room sitting in recliner, bed alarm was off.

## 2020-01-14 NOTE — Progress Notes (Signed)
Talked to therapy, confirmed bed alarm was going to be off with BSC nearby and okay for pt to transfer over due to incontinence. Informed pt was incontinent all night even with BSC.  Pt was incontinent prior to admission. Discussed with Loma Sousa, said was team decision to leave bed alarm off. She is to discuss with pt. Bed alarm will remain on.

## 2020-01-14 NOTE — Progress Notes (Signed)
Physical Therapy Discharge Summary  Patient Details  Name: Joann [name] MRN: [id number] Date of Birth: June 11, 1981  Today's Date: 01/15/2020   Patient has met 8 of 9 long term goals due to improved activity tolerance, improved balance, improved postural control, increased strength, ability to compensate for deficits, improved attention, improved awareness and improved coordination.  Patient to discharge at an ambulatory level Supervision.   Patient's care partner is independent to provide the necessary physical and cognitive assistance at discharge.  Reasons goals not met: Patient did not meet stair goal due to LE soreness/fatigue. Patient does not need to perform stairs at home upon d/c as patient has level entry and can stay on the first floor of her home.   Recommendation:  Patient will benefit from ongoing skilled PT services in outpatient setting to continue to advance safe functional mobility, address ongoing impairments in dynamic balance and gait, strength, endurance, safety awareness, attention, stair training, community mobility and minimize fall risk.  Equipment: No equipment provided  Reasons for discharge: treatment goals met  Patient/family agrees with progress made and goals achieved: Yes  PT Discharge Precautions/Restrictions Restrictions Weight Bearing Restrictions: No Vision/Perception  Perception Perception: Within Functional Limits Praxis Praxis Impairment Details: Perseveration Praxis-Other Comments: Perseveration much improved since admission and very infrequent at d/c  Cognition Overall Cognitive Status: Impaired/Different from baseline Arousal/Alertness: Awake/alert Orientation Level: Oriented to person;Oriented to place;Oriented to situation;Oriented to time Attention: Sustained;Selective Sustained Attention: Appears intact Selective Attention: Impaired Selective Attention Impairment: Functional complex;Verbal complex Memory: Impaired Memory Impairment:  Decreased short term memory;Storage deficit;Decreased recall of new information Decreased Short Term Memory: Functional complex;Verbal complex Awareness: Impaired Awareness Impairment: Anticipatory impairment Problem Solving: Impaired Problem Solving Impairment: Functional complex Safety/Judgment: Impaired Sensation Sensation Light Touch: Appears Intact Proprioception: Appears Intact Coordination Gross Motor Movements are Fluid and Coordinated: No Fine Motor Movements are Fluid and Coordinated: Yes Coordination and Movement Description: decreased lateral hip strength/stability and decreased balance Heel Shin Test: [facility] Motor  Motor Motor: Other (comment) Motor - Skilled Clinical Observations: mild LE weakness and decreased balance strategies  Mobility Bed Mobility Bed Mobility: Rolling Right;Rolling Left;Sit to Supine;Supine to Sit Rolling Right: Minimal Assistance - Patient > 75%;Independent Rolling Left: Minimal Assistance - Patient > 75%;Independent Supine to Sit: Minimal Assistance - Patient > 75%;Independent Sit to Supine: Minimal Assistance - Patient > 75%;Independent Transfers Transfers: Sit to Stand;Stand Pivot Transfers;Stand to Sit Sit to Stand: Independent Stand to Sit: Independent Stand Pivot Transfers: Independent Transfer (Assistive device): None Locomotion  Gait Ambulation: Yes Gait Assistance: Supervision/Verbal cueing Gait Distance (Feet): 1215 Feet Assistive device: None Gait Assistance Details: Verbal cues for precautions/safety Gait Gait Pattern: Step-through pattern;Decreased stride length;Decreased hip/knee flexion - right;Decreased hip/knee flexion - left;Trendelenburg;Lateral hip instability Stairs / Additional Locomotion Stairs Assistance: Contact Guard/Touching assist Stair Management Technique: One rail Right Number of Stairs: 12 Height of Stairs: 6 Ramp: Contact Guard/touching assist Curb: Moderate Assistance - Patient 50 - 74%(without  AD) Wheelchair Mobility Wheelchair Mobility: No(Patient is a Engineer, petroleum)  Trunk/Postural Assessment  Cervical Assessment Cervical Assessment: Within Functional Limits Thoracic Assessment Thoracic Assessment: Within Functional Limits Lumbar Assessment Lumbar Assessment: Within Functional Limits Postural Control Postural Control: Deficits on evaluation Protective Responses: decreased/delayed, but improved since admission  Balance Standardized Balance Assessment Standardized Balance Assessment: Berg Balance Test;Functional Gait Assessment Berg Balance Test Sit to Stand: Able to stand  independently using hands Standing Unsupported: Able to stand safely 2 minutes Sitting with Back Unsupported but Feet Supported on Floor or Stool: Able to sit  safely and securely 2 minutes Stand to Sit: Controls descent by using hands Transfers: Able to transfer safely, definite need of hands Standing Unsupported with Eyes Closed: Able to stand 10 seconds safely Standing Ubsupported with Feet Together: Able to place feet together independently and stand 1 minute safely From Standing, Reach Forward with Outstretched Arm: Can reach confidently >25 cm (10") From Standing Position, Pick up Object from Floor: Able to pick up shoe safely and easily From Standing Position, Turn to Look Behind Over each Shoulder: Looks behind from both sides and weight shifts well Turn 360 Degrees: Able to turn 360 degrees safely in 4 seconds or less Standing Unsupported, Alternately Place Feet on Step/Stool: Able to complete 4 steps without aid or supervision Standing Unsupported, One Foot in Front: Able to plae foot ahead of the other independently and hold 30 seconds Standing on One Leg: Tries to lift leg/unable to hold 3 seconds but remains standing independently Total Score: 47 Static Sitting Balance Static Sitting - Balance Support: No upper extremity supported;Feet unsupported Static Sitting - Level of  Assistance: 7: Independent Dynamic Sitting Balance Dynamic Sitting - Balance Support: No upper extremity supported;Feet unsupported Dynamic Sitting - Level of Assistance: 7: Independent Static Standing Balance Static Standing - Balance Support: During functional activity;No upper extremity supported Static Standing - Level of Assistance: 7: Independent Dynamic Standing Balance Dynamic Standing - Balance Support: During functional activity;No upper extremity supported Dynamic Standing - Level of Assistance: 7: Independent Functional Gait  Assessment Gait Level Surface: Walks 20 ft in less than 7 sec but greater than 5.5 sec, uses assistive device, slower speed, mild gait deviations, or deviates 6-10 in outside of the 12 in walkway width. Change in Gait Speed: Able to smoothly change walking speed without loss of balance or gait deviation. Deviate no more than 6 in outside of the 12 in walkway width. Gait with Horizontal Head Turns: Performs head turns smoothly with slight change in gait velocity (eg, minor disruption to smooth gait path), deviates 6-10 in outside 12 in walkway width, or uses an assistive device. Gait with Vertical Head Turns: Performs task with slight change in gait velocity (eg, minor disruption to smooth gait path), deviates 6 - 10 in outside 12 in walkway width or uses assistive device Gait and Pivot Turn: Pivot turns safely within 3 sec and stops quickly with no loss of balance. Step Over Obstacle: Is able to step over 2 stacked shoe boxes taped together (9 in total height) without changing gait speed. No evidence of imbalance. Gait with Narrow Base of Support: Ambulates 7-9 steps. Gait with Eyes Closed: Walks 20 ft, uses assistive device, slower speed, mild gait deviations, deviates 6-10 in outside 12 in walkway width. Ambulates 20 ft in less than 9 sec but greater than 7 sec. Ambulating Backwards: Walks 20 ft, uses assistive device, slower speed, mild gait deviations,  deviates 6-10 in outside 12 in walkway width. Steps: Two feet to a stair, must use rail. Total Score: 22 Extremity Assessment  RUE Assessment RUE Assessment: Within Functional Limits LUE Assessment LUE Assessment: Within Functional Limits RLE Assessment RLE Assessment: Exceptions to [facility] General Strength Comments: grossly 4/5 to 4/5 proximal to distal LLE Assessment LLE Assessment: Exceptions to [facility] General Strength Comments: grossly 4/5 to 4/5 proximal to distal    Cherie L Grunenberg PT, DPT  01/14/2020, 4:28 PM

## 2020-01-14 NOTE — Progress Notes (Signed)
Occupational Therapy Session Note  Patient Details  Name: Joann [name] MRN: [id number] Date of Birth: 1981-09-02 n Today's Date: 01/14/2020 OT Individual Time: 1300-1413 OT Individual Time Calculation (min): 73 min    Short Term Goals: Week 1:  OT Short Term Goal 1 (Week 1): Pt will complete transfer off of toilet with Min A for sit<stand OT Short Term Goal 2 (Week 1): Pt will complete LB dressing with Min A OT Short Term Goal 3 (Week 1): Pt will complete 1 grooming task while standing at the sink to improve standing balance  Skilled Therapeutic Interventions/Progress Updates:    Pt greeted long sitting in bed and agreeable to OT treatment session. Pt requested to don TEDS and shoes. OT educated on technique to don TEDS with pt demonstrating understanding without cues. Pt then came to sitting EOB mod I and donned shoes. Pt declined need to go to the bathroom or perform any more BADL tasks. Pt ambulated to elevators and took elevators down to gift shop. Ambulated around gift shop with supervision and had pt look for items she would like to have for mothers day. Worked on memory by having pt recall some of the items she saw in gift shop upon return to the floor. Functional ambulation outside on uneven surfaces with supervision. Education provided for safety awareness in community environment and energy conservation techniques. UB there-ex with [employer] arm bike on l;evel 5 for 5 mins x3. Worked on balance strategies with ambulation and keeping ball on flat round plate. Pt returned to room and went to the bathroom with supervision. Pt left seated at EOB with spouse present and needs met. Nursing ordered a new lunch tray as pt's tray was taken before she was finished.   Therapy Documentation Precautions:  Precautions Precautions: Fall Restrictions Weight Bearing Restrictions: No Pain:  denies pain   Therapy/Group: Individual Therapy  Valma Cava 01/14/2020, 1:30 PM

## 2020-01-14 NOTE — Progress Notes (Signed)
Upon entering room bed alarm off, placed bed alarm on. Discussed with NT, she also found pt in bed no socks on with bed alarm off. She put bed alarm on. Pt reports that bed alarm supposed to be off. Will address with therapy. Pt called husband and he is upset and yelling due to bed alarm being placed back on per NT.

## 2020-01-14 NOTE — Progress Notes (Signed)
Speech Language Pathology Daily Session Note  Patient Details  Name: MILTA CROSON MRN: [id number] Date of Birth: 20-Feb-1981  Today's Date: 01/14/2020 SLP Individual Time: 1100-1155 SLP Individual Time Calculation (min): 55 min  Short Term Goals: Week 1: SLP Short Term Goal 1 (Week 1): Patient will utilize external aids as trained and implemented in therapy to enhance functional cognitive-linguistic skills with 80% accuracy and mod cues. SLP Short Term Goal 2 (Week 1): Patient will demonstrate sustained attention to task for 15 minutes with mod verbal cues for redirection. SLP Short Term Goal 3 (Week 1): Patient will complete delayed recall tasks for 5 stimuli post ~5 minute delay with 80% accuracy and mod cues. SLP Short Term Goal 4 (Week 1): Patient will complete functional problem solving scenarios with 80% accuracy and mod cues.  Skilled Therapeutic Interventions: Skilled treatment session focused on cognitive goals. SLP facilitated session by re-administering the Cognistat. Patient demonstrated improvement in overall performance and scored WFL on all subtests with the exception of mild-moderate impairments in immediate recall/attention. Patient verbalized understanding of results. Patient requested to use the bathroom and ambulated with overall supervision without any unsafe behaviors noted. Patient left upright in bed with alarm on and all needs within reach. Continue with current plan of care.      Pain No/Denies Pain   Therapy/Group: Individual Therapy  Valari Taylor 01/14/2020, 12:18 PM

## 2020-01-14 NOTE — Progress Notes (Addendum)
Physical Therapy Session Note  Patient Details  Name: Joann [name] MRN: [id number] Date of Birth: [birth year]  Today's Date: 01/14/2020 PT Individual Time: 0850-1000 PT Individual Time Calculation (min): 70 min   Short Term Goals: Week 1:  PT Short Term Goal 1 (Week 1): STG=LTG  Skilled Therapeutic Interventions/Progress Updates:     Patient in w/c in room upon PT arrival. Patient alert and agreeable to PT session. Patient denied pain during session. Patient reported that she was greatful to be able to use the Joann [name] independently last night and reported no incontinence throughout the night. Focused session on reassessment of outcome measures and community mobility.   Therapeutic Activity: Transfers: Patient performed sit to/from stand x5 with mod I using B UEs to push up and lower down.  Five times Sit to Stand Test (FTSS) Method: Use a straight back chair with a solid seat that is 16-18" high. Ask participant to sit on the chair with arms folded across their chest.   Instructions: "Stand up and sit down as quickly as possible 5 times, keeping your arms folded across your chest."   Measurement: Stop timing when the participant stands the 5th time.  TIME: _15.6_____ (in seconds) with use of UEs due to LE soreness/fatigue and increased fear of falling/safety awareness with improved cognition  Times > 13.6 seconds is associated with increased disability and morbidity (Guralnik, 2000) Times > 15 seconds is predictive of recurrent falls in healthy individuals aged 33 and older (Buatois, et al., 2008) Normal performance values in community dwelling individuals aged 21 and older (Bohannon, 2006): o 60-69 years: 11.4 seconds o 70-79 years: 12.6 seconds o 80-89 years: 14.8 seconds  MCID: ? 2.3 seconds for Vestibular Disorders (Meretta, 2006)  Gait Training:  Patient ambulated around the unit 100-250 feet x3 without an AD with supervision-mod I for safety. Ambulated with decreased gait speed,  decreased propulsion, decreased step height, and mild B trendelenburg without LOB or toe drag throughout today. Provided verbal cues for increased gait speed to promote increased step length and propulsion. Patient ascended/descended 11 steps (1 flight in stairwell) using R rail with B UE support with CGA. Performed step-to gait pattern with increased trunk and knee flexion due to LE fatigue this session. Provided cues for technique and sequencing.  6 Min Walk Test:  Instructed patient to ambulate as quickling and as safely as possible for 6 minutes using LRAD. Patient was allowed to take standing rest breaks without stopping the test, but if he required a sitting rest break the clock would be stopped and the test would be over.  Results: 9629 feet/370 meters and RPE 1/10 at end of test. Results indicate decreased endurance based on age matched norms.  Neuromuscular Re-ed: Patient performed the following dynamic standing and gait assessments: Berg Balance Scale: Patient demonstrates increased fall risk as noted by score of 47/56 on Berg Balance Scale.  (<36= high risk for falls, close to 100%; 37-45 significant >80%; 46-51 moderate >50%; 52-55 lower >25%) Score increased with on step taps, standing in staggered stance, and reduced on sit to stand and SLS due to UE use with improved cognition/awareness of fear of falling Functional Gait Assessment: Patient demonstrates improved dynamic gait with an increased score of 22/30 from 15/30 on 5/3 (<19/30 idicates high fall risk with dynamic gait)  Patient sitting in bed at end of session with breaks locked and all needs within reach. Per discussion with RN after session, patient was incontinent of bladder last night and she  reported concerns about patient's safety in the room and requested to have her bed alarm set. Notified therapy team.    Therapy Documentation Precautions:  Precautions Precautions: Fall Restrictions Weight Bearing Restrictions:  No    Therapy/Group: Individual Therapy  Roberts Bon L Cristyn Crossno PT, DPT  01/14/2020, 3:55 PM

## 2020-01-14 NOTE — Progress Notes (Signed)
PHYSICAL MEDICINE & REHABILITATION PROGRESS NOTE  Subjective/Complaints: Up walking with therapy. No new complaints today. Excited about going home this weekend  ROS: Patient denies fever, rash, sore throat, blurred vision, nausea, vomiting, diarrhea, cough, shortness of breath or chest pain, joint or back pain, headache, or mood change.      Objective: Vital Signs: Blood pressure 117/69, pulse 72, temperature 98.5 F (36.9 C), temperature source Oral, resp. rate 18, weight 68.3 kg, SpO2 100 %. No results found. Recent Labs    01/14/20 0518  WBC 9.9  HGB 7.3*  HCT 24.0*  PLT 80*   Recent Labs    01/14/20 0518  NA 137  K 3.1*  CL 109  CO2 20*  GLUCOSE 97  BUN 9  CREATININE 0.52  CALCIUM 8.4*    Physical Exam: BP 117/69 (BP Location: Right Arm)   Pulse 72   Temp 98.5 F (36.9 C) (Oral)   Resp 18   Wt 68.3 kg   SpO2 100%   BMI 25.85 kg/m  Constitutional: No distress . Vital signs reviewed. HEENT: EOMI, oral membranes moist Neck: supple Cardiovascular: RRR without murmur. No JVD    Respiratory/Chest: CTA Bilaterally without wheezes or rales. Normal effort    GI/Abdomen: BS +, non-tender, non-distended Ext: no clubbing, cyanosis, or edema Psych: pleasant but anxious Skin: Warm and dry.  Intact. Psych: more focused and on point. Less confabulation. Decreased insight Musc: No edema in extremities.  No tenderness in extremities. Neurological: Alert Motor: Grossly 4+/5 in all 4. Improved standing and walking balance  Assessment/Plan: 1. Functional deficits secondary to encephalopathy which require 3+ hours per day of interdisciplinary therapy in a comprehensive inpatient rehab setting.  Physiatrist is providing close team supervision and 24 hour management of active medical problems listed below.  Physiatrist and rehab team continue to assess barriers to discharge/monitor patient progress toward functional and medical goals  Care Tool:  Bathing    Body parts bathed by patient: Right arm, Left arm, Chest, Abdomen, Front perineal area, Buttocks, Right upper leg, Left upper leg, Left lower leg, Right lower leg, Face   Body parts bathed by helper: Front perineal area, Buttocks     Bathing assist Assist Level: Supervision/Verbal cueing     Upper Body Dressing/Undressing Upper body dressing   What is the patient wearing?: Pull over shirt    Upper body assist Assist Level: Supervision/Verbal cueing    Lower Body Dressing/Undressing Lower body dressing      What is the patient wearing?: Pants     Lower body assist Assist for lower body dressing: Supervision/Verbal cueing     Toileting Toileting    Toileting assist Assist for toileting: Minimal Assistance - Patient > 75%     Transfers Chair/bed transfer  Transfers assist     Chair/bed transfer assist level: Independent Chair/bed transfer assistive device: Armrests   Locomotion Ambulation   Ambulation assist      Assist level: Contact Guard/Touching assist Assistive device: No Device Max distance: >200 ft   Walk 10 feet activity   Assist     Assist level: Supervision/Verbal cueing Assistive device: No Device   Walk 50 feet activity   Assist    Assist level: Contact Guard/Touching assist Assistive device: No Device    Walk 150 feet activity   Assist    Assist level: Contact Guard/Touching assist Assistive device: No Device    Walk 10 feet on uneven surface  activity   Assist     Assist  level: Contact Guard/Touching assist     Wheelchair     Assist Will patient use wheelchair at discharge?: Yes Type of Wheelchair: Manual    Wheelchair assist level: Minimal Assistance - Patient > 75% Max wheelchair distance: 100    Wheelchair 50 feet with 2 turns activity    Assist        Assist Level: Minimal Assistance - Patient > 75%   Wheelchair 150 feet activity     Assist     Assist Level: Moderate Assistance -  Patient 50 - 74%      Medical Problem List and Plan: 1.  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  Continue lactulose as directed as well as prednisolone x28 days total per GI services as well as Xifaxan for chronic liver disease  -Continue CIR therapies including PT, OT, and SLP  ELOS 5/8  -will need GI/PCP f/u at discharge, substance abuse f/u when more cognitively appropriate 2.  Antithrombotics: -DVT/anticoagulation: SCDs             -antiplatelet therapy: N/A 3. Pain Management: Tylenol 4. Mood: Trazodone 25 mg nightly             -antipsychotic agents: N/A 5. Neuropsych: This patient is not capable of making decisions on her own behalf. 6. Skin/Wound Care: Routine skin checks 7. Fluids/Electrolytes/Nutrition: Routine in and outs.  8.  Coagulopathy/thrombocytopenia/chronic macrocytic anemia/leukocytosis.  Leukocytosis felt to be related to prednisolone.  Follow-up chemistries.  Transfuse if symptomatic             Hemoglobin 7.0 on 4/30, 7.8 5/3---> 7.3 5/6  Platelets 53 on 4/30, up to 108k 5/3-->80k 5/6  WBCs 16.9 on 4/30, down to 14.7 5/3---> 9.9 5/6  -will need follow up after discharge    9.  Orthostasis.  ProAmatine 3 times daily.  Controlled on 5/6  Monitor with increased mobility 10.  Decreased nutritional storage.  Follow-up dietary services             Supplementation 11.  Steroid-induced hyperglycemia  Sugars now normal 12.  Transaminitis  LFT's stable to improved  5/3----stable on 5/6  LOS: 6 days A FACE TO FACE EVALUATION WAS PERFORMED  [name] 01/14/2020, 9:19 AM

## 2020-01-14 NOTE — Progress Notes (Signed)
Husband at bedside. Reported he is upset about bed alarm. Informed him that for her safety bed alarm will remain on and staff will toilet every 2 or 3 hours and she has been instructed to call if needing to go between. Informed that pt was incontinent throughout the night but has remained continent so far this afternoon

## 2020-01-15 ENCOUNTER — Inpatient Hospital Stay (HOSPITAL_COMMUNITY): Payer: Medicaid Other | Admitting: Occupational Therapy

## 2020-01-15 ENCOUNTER — Ambulatory Visit (HOSPITAL_COMMUNITY): Payer: Medicaid Other | Admitting: Physical Therapy

## 2020-01-15 ENCOUNTER — Encounter (HOSPITAL_COMMUNITY): Payer: Medicaid Other | Admitting: Speech Pathology

## 2020-01-15 NOTE — Progress Notes (Signed)
[place] PHYSICAL MEDICINE & REHABILITATION PROGRESS NOTE  Subjective/Complaints: In bed. No new complaints. Anxious awaits dc tomorrow.   ROS: Patient denies fever, rash, sore throat, blurred vision, nausea, vomiting, diarrhea, cough, shortness of breath or chest pain, joint or back pain, headache, or mood change.      Objective: Vital Signs: Blood pressure 122/71, pulse 60, temperature 98.7 F (37.1 C), temperature source Oral, resp. rate 18, weight 65.7 kg, SpO2 100 %. No results found. Recent Labs    01/14/20 0518  WBC 9.9  HGB 7.3*  HCT 24.0*  PLT 80*   Recent Labs    01/14/20 0518  NA 137  K 3.1*  CL 109  CO2 20*  GLUCOSE 97  BUN 9  CREATININE 0.52  CALCIUM 8.4*    Physical Exam: BP 122/71 (BP Location: Right Arm)   Pulse 60   Temp 98.7 F (37.1 C) (Oral)   Resp 18   Wt 65.7 kg   SpO2 100%   BMI 24.86 kg/m  Constitutional: No distress . Vital signs reviewed. HEENT: EOMI, oral membranes moist Neck: supple Cardiovascular: RRR without murmur. No JVD    Respiratory/Chest: CTA Bilaterally without wheezes or rales. Normal effort    GI/Abdomen: BS +, non-tender, non-distended Ext: no clubbing, cyanosis, or edema Psych: pleasant and cooperative. Calm today Skin: Warm and dry.  Intact. Psych: improved insight, awareness, and concentration. Musc: No edema in extremities.  No tenderness in extremities. Neurological: Alert Motor: Grossly 5/5 in all 4. No limb ataxia  Assessment/Plan: 1. Functional deficits secondary to encephalopathy which require 3+ hours per day of interdisciplinary therapy in a comprehensive inpatient rehab setting.  Physiatrist is providing close team supervision and 24 hour management of active medical problems listed below.  Physiatrist and rehab team continue to assess barriers to discharge/monitor patient progress toward functional and medical goals  Care Tool:  Bathing    Body parts bathed by patient: Right arm, Left arm,  Chest, Abdomen, Front perineal area, Buttocks, Right upper leg, Left upper leg, Left lower leg, Right lower leg, Face   Body parts bathed by helper: Front perineal area, Buttocks     Bathing assist Assist Level: Supervision/Verbal cueing     Upper Body Dressing/Undressing Upper body dressing   What is the patient wearing?: Pull over shirt    Upper body assist Assist Level: Supervision/Verbal cueing    Lower Body Dressing/Undressing Lower body dressing      What is the patient wearing?: Pants     Lower body assist Assist for lower body dressing: Supervision/Verbal cueing     Toileting Toileting    Toileting assist Assist for toileting: Minimal Assistance - Patient > 75%     Transfers Chair/bed transfer  Transfers assist     Chair/bed transfer assist level: Independent Chair/bed transfer assistive device: Armrests   Locomotion Ambulation   Ambulation assist      Assist level: Supervision/Verbal cueing Assistive device: No Device Max distance: >200 ft   Walk 10 feet activity   Assist     Assist level: Supervision/Verbal cueing Assistive device: No Device   Walk 50 feet activity   Assist    Assist level: Supervision/Verbal cueing Assistive device: No Device    Walk 150 feet activity   Assist    Assist level: Supervision/Verbal cueing Assistive device: No Device    Walk 10 feet on uneven surface  activity   Assist     Assist level: Contact Guard/Touching assist     Wheelchair  Assist Will patient use wheelchair at discharge?: Yes Type of Wheelchair: Manual    Wheelchair assist level: Minimal Assistance - Patient > 75% Max wheelchair distance: 100    Wheelchair 50 feet with 2 turns activity    Assist        Assist Level: Minimal Assistance - Patient > 75%   Wheelchair 150 feet activity     Assist     Assist Level: Moderate Assistance - Patient 50 - 74%      Medical Problem List and Plan: 1.   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  Continue lactulose as directed as well as prednisolone x28 days total per GI services as well as Xifaxan for chronic liver disease  -Continue CIR therapies including PT, OT, and SLP  ELOS 5/8  -will need GI/PCP f/u at discharge, substance abuse f/u when more cognitively appropriate. Had another talk about her substance abuse and the seriousness of her situation  -24 supervision at home after discharge 2.  Antithrombotics: -DVT/anticoagulation: SCDs             -antiplatelet therapy: N/A 3. Pain Management: Tylenol 4. Mood: Trazodone 25 mg nightly             -antipsychotic agents: N/A 5. Neuropsych: This patient is not capable of making decisions on her own behalf. 6. Skin/Wound Care: Routine skin checks 7. Fluids/Electrolytes/Nutrition: Routine in and outs.  8.  Coagulopathy/thrombocytopenia/chronic macrocytic anemia/leukocytosis.  Leukocytosis felt to be related to prednisolone.  Follow-up chemistries.  Transfuse if symptomatic             Hemoglobin 7.0 on 4/30, 7.8 5/3---> 7.3 5/6  Platelets 53 on 4/30, up to 108k 5/3-->80k 5/6  WBCs 16.9 on 4/30, down to 14.7 5/3---> 9.9 5/6  -will need follow up of labs after discharge    9.  Orthostasis.  ProAmatine 3 times daily.  Controlled on 5/7  Monitor with increased mobility 10.  Decreased nutritional storage.  Follow-up dietary services             Supplementation 11.  Steroid-induced hyperglycemia  Sugars now normal 12.  Transaminitis  LFT's stable to improved  5/3----stable on 5/6  LOS: 7 days A FACE TO Tobias 01/15/2020, 9:58 AM

## 2020-01-15 NOTE — Progress Notes (Signed)
Physical Therapy Session Note  Patient Details  Name: VANESHA ATHENS MRN: [id number] Date of Birth: [birth year]  Today's Date: 01/15/2020 PT Individual Time: 1004-1100 PT Individual Time Calculation (min): 56 min   Short Term Goals: Week 1:  PT Short Term Goal 1 (Week 1): STG=LTG  Skilled Therapeutic Interventions/Progress Updates:   Pt received sitting EOB and agreeable to PT. Family present for education for entire session. Gait training throughout rehab unit and through hospital 2 x >1076f with supervision assist. Min cues for improved step height to prevent foot drag. One near LOB, but able to correct without additional assistance from PT. Car transfer training to simulator and to family truck with supervision assist initially, but required min assist on second attempt in to truck due to LLE weakness. Dynamic gait training over unlevel surface with supervision assist min cues for decreased speed and improve foot clearance. Stair negotiation training to ascend/descend 4 steps with 1 rail x 4 with supervision assist from PT and various members of family. Cues for proper garding technique to prevent LOB and reduce fall risk as fagitue increases. Patient returned to room and instructed in HEP of [place] balance exercise with hand out provided; performed x 5 BLE for each exercise. Pt then left sitting in WC with call bell in reach and all needs met.         Therapy Documentation Precautions:  Precautions Precautions: Fall Restrictions Weight Bearing Restrictions: No Pain: denies   Therapy/Group: Individual Therapy  ALorie Phenix5/03/2020, 11:05 AM

## 2020-01-15 NOTE — Progress Notes (Signed)
Daughter taking home the [facility].

## 2020-01-15 NOTE — Discharge Instructions (Addendum)
Inpatient Rehab Discharge Instructions  [place] Discharge date and time: No discharge date for patient encounter.   Activities/Precautions/ Functional Status: Activity: activity as tolerated Diet: regular diet Wound Care: none needed Functional status:  ___ No restrictions     ___ Walk up steps independently ___ 24/7 supervision/assistance   ___ Walk up steps with assistance ___ Intermittent supervision/assistance  ___ Bathe/dress independently ___ Walk with walker     _x__ Bathe/dress with assistance ___ Walk Independently    ___ Shower independently ___ Walk with assistance    ___ Shower with assistance ___ No alcohol     ___ Return to work/school ________   COMMUNITY REFERRALS UPON DISCHARGE:      Outpatient: PT     OT    ST                 Agency: [facility]                                      [place]                                      [place], [place]              Phone: [phone]              Appointment Date/Time: Monday, 5/10 at 10:15am Neuro PT eval                                                                Monday, 5/10 at 11am Neuro ST eval                                                                Tuesday, 5/18 at 1:15pm Neuro OT eval  Medical Equipment/Items Ordered:3n1 [facility]                                                 Agency/Supplier:    Special Instructions: No driving smoking or alcohol   My questions have been answered and I understand these instructions. I will adhere to these goals and the provided educational materials after my discharge from the hospital.  Patient/Caregiver Signature _______________________________ Date __________  Clinician Signature _______________________________________ Date __________  Please bring this form and your medication list with you to all your follow-up doctor's appointments.

## 2020-01-15 NOTE — Progress Notes (Signed)
Occupational Therapy Discharge Summary  Patient Details  Name: Joann [name] MRN: [id number] Date of Birth: November 22, 1980  Today's Date: 01/15/2020 OT Individual Time: 9371-6967 OT Individual Time Calculation (min): 50 min    OT treatment session focused on increased independence with BADL tasks and family education with daughter. Educated pt and her daughter on safety awareness and home modifications for BADL takss. Pt able to access dresser drawers, collect clothing, ambulate to bathroom all without assist from OT. Bathing/dressing completed mod I with increased time. OT provided pt with home fine motor program and reviewed activities with pt and daughter. Pt left sitting in bed with alarm on and needs met. See functional navigator for further details.   Patient has met 8 of 8 long term goals due to improved activity tolerance, improved balance, postural control, ability to compensate for deficits, improved attention, improved awareness and improved coordination.  Patient to discharge at overall modified Independent/supervision level.  Patient's care partner is independent to provide the necessary physical and cognitive assistance at discharge for higher level iADL tasks.    Reasons goals not met: n/a  Recommendation:  Patient will benefit from ongoing skilled OT services in outpatient setting to continue to advance functional skills in the area of BADL.  Equipment: 3-in-1 BSC  Reasons for discharge: treatment goals met and discharge from hospital  Patient/family agrees with progress made and goals achieved: Yes  OT Discharge Precautions/Restrictions  Precautions Precautions: Fall Restrictions Weight Bearing Restrictions: No Pain   denies pain ADL ADL Eating: Independent Grooming: Independent Upper Body Bathing: Independent Lower Body Bathing: Independent Upper Body Dressing: Independent WLower Body Dressing: Independent Toileting: Independent Where Assessed-Toileting:  Glass blower/designer: Programmer, applications Method: Ambulating(RW) Clinical cytogeneticist: Not assessed Social research officer, government: Independent Cognition Overall Cognitive Status: Impaired/Different from baseline Arousal/Alertness: Awake/alert Orientation Level: Oriented to person;Oriented to place;Oriented to situation;Oriented to time Attention: Sustained;Selective Sustained Attention: Appears intact Selective Attention: Impaired Selective Attention Impairment: Functional complex;Verbal complex Memory: Impaired Memory Impairment: Decreased short term memory;Storage deficit;Decreased recall of new information Decreased Short Term Memory: Functional complex;Verbal complex Awareness: Impaired Awareness Impairment: Anticipatory impairment Problem Solving: Impaired Problem Solving Impairment: Functional complex Safety/Judgment: Impaired Mobility  Bed Mobility Bed Mobility: Rolling Right;Rolling Left;Sit to Supine;Supine to Sit Rolling Right: Independent Rolling Left: Independent Supine to Sit: Independent Sit to Supine: Independent Transfers Sit to Stand: Independent Stand to Sit: Independent   Balance Static Sitting Balance Static Sitting - Balance Support: No upper extremity supported Static Sitting - Level of Assistance: 7: Independent Dynamic Sitting Balance Dynamic Sitting - Balance Support: No upper extremity supported;Feet unsupported Dynamic Sitting - Level of Assistance: 7: Independent Static Standing Balance Static Standing - Balance Support: During functional activity Static Standing - Level of Assistance: 7: Independent Dynamic Standing Balance Dynamic Standing - Balance Support: During functional activity;No upper extremity supported Dynamic Standing - Level of Assistance: 7: Independent Extremity/Trunk Assessment RUE Assessment RUE Assessment: Within Functional Limits LUE Assessment LUE Assessment: Within Functional Limits   Daneen Schick Nella Botsford 01/15/2020,  2:40 PM

## 2020-01-15 NOTE — Progress Notes (Signed)
Pt sitting in bed with bed alarm off. Toileted pt and left sitting at 90 degree angle. Daughter currently at bedside and brought pt lunch. Bed alarm on and functioning.

## 2020-01-15 NOTE — Progress Notes (Signed)
Speech Language Pathology Discharge Summary  Patient Details  Name: Joann [name] MRN: [id number] Date of Birth: 1981/04/08  Today's Date: 01/15/2020  Session 1: SLP Individual Time: 1100-1140 SLP Individual Time Calculation (min): 40 min   Session 2: SLP Individual Time: 1330-1350 SLP Individual Time Calculation (min): 20 min  Skilled Therapeutic Interventions:    Session 1: Skilled treatment session focused on completion of family education with the patient and her family (husband and daughter). All were educated regarding patient's current cognitive functioning and strategies to utilize at home to maximize safety, independence, organization, problem solving, attention and recall. All verbalized understanding and handouts were given to reinforce information. Patient left upright in bed with family present.   Session 2:  Skilled treatment session focused on cognitive goals. SLP facilitated session by providing overall Min A verbal cues for complex problem solving and recall during a novel card task that she utilize at home with her family. Patient verbalized understanding and asked her daughter to purchase the game for home. Patient left upright in bed with daughter present. Continue with current plan of care.   Patient has met 4 of 4 long term goals.  Patient to discharge at overall Min;Mod level.   Reasons goals not met: N/A   Clinical Impression/Discharge Summary: Patient has made functional gains and has met 4 of 4 LTGs this admission. Currently, patient requires overall Min-Mod A verbal and visual cues to complete functional and mildly complex tasks safely regarding problem solving, error awareness, attention and recall with use of strategies. Patient and family education is complete and patient will discharge home with 24 hour supervision from family. Patient would benefit from f/u SLP intervention to maximize her cognitive functioning and reduce caregiver burden.   Care Partner:   Caregiver Able to Provide Assistance: Yes  Type of Caregiver Assistance: Physical;Cognitive  Recommendation:  24 hour supervision/assistance;Outpatient SLP  Rationale for SLP Follow Up: Maximize cognitive function and independence;Reduce caregiver burden   Equipment: N/A   Reasons for discharge: Discharged from hospital;Treatment goals met   Patient/Family Agrees with Progress Made and Goals Achieved: Yes    [place], [place] 01/15/2020, 6:28 AM

## 2020-01-15 NOTE — Care Management (Signed)
   The overall goal for the admission was met for:   Discharge location: Home with spouse  Length of Stay: 8 days with discharge on 01/16/20  Discharge activity level: Patient to discharge at a overall supervision level.  Home/community participation: Clinical research associate provided included: MD, RD, PT, OT, SLP, RN, CM, TR, Pharmacy, Neuropsych and SW  Financial Services: Medicaid  Follow-up services arranged: Outpatient: PT, OT SLP and Patient/Family has no preference for HH/DME agencies  DME:3 n1 BSC  Comments (or additional information):  [facility] [address] [place], [place]  01/18/20 @ 10:15 am PT eval;  01/18/20 @ 11:00 am ST eval;  01/26/20  @  1:15pm OT eval  Patient/Family verbalized understanding of follow-up arrangements: Yes   Family education set up for 01/15/20 @ 1000  Individual responsible for coordination of the follow-up plan: Spouse Terrance [phone]  Daughter:Shakayla [phone]  Confirmed DME ordered: Margarito Liner 01/15/2020    Margarito Liner

## 2020-01-15 NOTE — Plan of Care (Signed)
  Problem: RH Bathing Goal: LTG Patient will bathe all body parts with assist levels (OT) Description: LTG: Patient will bathe all body parts with assist levels (OT) Outcome: Completed/Met   Problem: RH Dressing Goal: LTG Patient will perform upper body dressing (OT) Description: LTG Patient will perform upper body dressing with assist, with/without cues (OT). Outcome: Completed/Met Goal: LTG Patient will perform lower body dressing w/assist (OT) Description: LTG: Patient will perform lower body dressing with assist, with/without cues in positioning using equipment (OT) Outcome: Completed/Met   Problem: RH Toileting Goal: LTG Patient will perform toileting task (3/3 steps) with assistance level (OT) Description: LTG: Patient will perform toileting task (3/3 steps) with assistance level (OT)  Outcome: Completed/Met   Problem: RH Toilet Transfers Goal: LTG Patient will perform toilet transfers w/assist (OT) Description: LTG: Patient will perform toilet transfers with assist, with/without cues using equipment (OT) Outcome: Completed/Met   Problem: RH Tub/Shower Transfers Goal: LTG Patient will perform tub/shower transfers w/assist (OT) Description: LTG: Patient will perform tub/shower transfers with assist, with/without cues using equipment (OT) Outcome: Completed/Met   Problem: RH Balance Goal: LTG Patient will maintain dynamic standing with ADLs (OT) Description: LTG:  Patient will maintain dynamic standing balance with assist during activities of daily living (OT)  Outcome: Completed/Met   Problem: Sit to Stand Goal: LTG:  Patient will perform sit to stand in prep for activites of daily living with assistance level (OT) Description: LTG:  Patient will perform sit to stand in prep for activites of daily living with assistance level (OT) Outcome: Completed/Met   Problem: RH Bathing Goal: LTG Patient will bathe all body parts with assist levels (OT) Description: LTG: Patient will  bathe all body parts with assist levels (OT) Outcome: Completed/Met   Problem: RH Dressing Goal: LTG Patient will perform upper body dressing (OT) Description: LTG Patient will perform upper body dressing with assist, with/without cues (OT). Outcome: Completed/Met Goal: LTG Patient will perform lower body dressing w/assist (OT) Description: LTG: Patient will perform lower body dressing with assist, with/without cues in positioning using equipment (OT) Outcome: Completed/Met   Problem: RH Toileting Goal: LTG Patient will perform toileting task (3/3 steps) with assistance level (OT) Description: LTG: Patient will perform toileting task (3/3 steps) with assistance level (OT)  Outcome: Completed/Met   Problem: RH Toilet Transfers Goal: LTG Patient will perform toilet transfers w/assist (OT) Description: LTG: Patient will perform toilet transfers with assist, with/without cues using equipment (OT) Outcome: Completed/Met   Problem: RH Tub/Shower Transfers Goal: LTG Patient will perform tub/shower transfers w/assist (OT) Description: LTG: Patient will perform tub/shower transfers with assist, with/without cues using equipment (OT) Outcome: Completed/Met

## 2020-01-15 NOTE — Progress Notes (Addendum)
Restful throughout shift and cooperative, emotional support provided r/t current condition and the effect this has had on her family, states she is looking forward in going to counseling. Bed alarm remains on and monitor, Continue Q 2 hr time tolectin with staff assistance, Call bell within reach, monitoring continue

## 2020-01-16 ENCOUNTER — Other Ambulatory Visit (HOSPITAL_COMMUNITY): Payer: Self-pay | Admitting: Physical Medicine & Rehabilitation

## 2020-01-16 MED ORDER — POTASSIUM CHLORIDE CRYS ER 20 MEQ PO TBCR
40.0000 meq | EXTENDED_RELEASE_TABLET | Freq: Once | ORAL | Status: AC
  Administered 2020-01-16: 40 meq via ORAL
  Filled 2020-01-16: qty 2

## 2020-01-16 MED ORDER — POTASSIUM CHLORIDE ER 20 MEQ PO TBCR: 10.0000 meq | EXTENDED_RELEASE_TABLET | Freq: Every day | ORAL | 1 refills | Status: DC

## 2020-01-16 NOTE — Progress Notes (Signed)
[facility] PHYSICAL MEDICINE & REHABILITATION PROGRESS NOTE  Subjective/Complaints: Pt up in chair. Ready to go home  ROS: Patient denies fever, rash, sore throat, blurred vision, nausea, vomiting, diarrhea, cough, shortness of breath or chest pain, joint or back pain, headache, or mood change. .      Objective: Vital Signs: Blood pressure 114/64, pulse 76, temperature 98 F (36.7 C), resp. rate 16, weight 65.7 kg, SpO2 100 %. No results found. Recent Labs    01/14/20 0518  WBC 9.9  HGB 7.3*  HCT 24.0*  PLT 80*   Recent Labs    01/14/20 0518  NA 137  K 3.1*  CL 109  CO2 20*  GLUCOSE 97  BUN 9  CREATININE 0.52  CALCIUM 8.4*    Physical Exam: BP 114/64 (BP Location: Left Arm)   Pulse 76   Temp 98 F (36.7 C)   Resp 16   Wt 65.7 kg   SpO2 100%   BMI 24.86 kg/m  Constitutional: No distress . Vital signs reviewed. HEENT: EOMI, oral membranes moist, icterus better Neck: supple Cardiovascular: RRR without murmur. No JVD    Respiratory/Chest: CTA Bilaterally without wheezes or rales. Normal effort    GI/Abdomen: BS +, non-tender, non-distended Ext: no clubbing, cyanosis, or edema Psych: pleasant and cooperative Skin: Warm and dry.  Intact. Psych: improved insight, awareness, and concentration. Musc: No edema in extremities.  No tenderness in extremities. Neurological: Alert Motor: Grossly 5/5 in all 4. No limb ataxia  Assessment/Plan: 1. Functional deficits secondary to encephalopathy which require 3+ hours per day of interdisciplinary therapy in a comprehensive inpatient rehab setting.  Physiatrist is providing close team supervision and 24 hour management of active medical problems listed below.  Physiatrist and rehab team continue to assess barriers to discharge/monitor patient progress toward functional and medical goals  Care Tool:  Bathing    Body parts bathed by patient: Right arm, Left arm, Chest, Front perineal area, Abdomen, Buttocks, Right  upper leg, Left lower leg, Face, Left upper leg, Right lower leg   Body parts bathed by helper: Front perineal area, Buttocks     Bathing assist Assist Level: Independent     Upper Body Dressing/Undressing Upper body dressing   What is the patient wearing?: Pull over shirt, Bra    Upper body assist Assist Level: Independent    Lower Body Dressing/Undressing Lower body dressing      What is the patient wearing?: Pants, Underwear/pull up     Lower body assist Assist for lower body dressing: Independent     Toileting Toileting    Toileting assist Assist for toileting: Independent     Transfers Chair/bed transfer  Transfers assist     Chair/bed transfer assist level: Independent Chair/bed transfer assistive device: Armrests   Locomotion Ambulation   Ambulation assist      Assist level: Supervision/Verbal cueing Assistive device: No Device Max distance: 1000   Walk 10 feet activity   Assist     Assist level: Supervision/Verbal cueing Assistive device: No Device   Walk 50 feet activity   Assist    Assist level: Supervision/Verbal cueing Assistive device: No Device    Walk 150 feet activity   Assist    Assist level: Supervision/Verbal cueing Assistive device: No Device    Walk 10 feet on uneven surface  activity   Assist     Assist level: Supervision/Verbal cueing     Wheelchair     Assist Will patient use wheelchair at discharge?: No Type  of Wheelchair: Manual Wheelchair activity did not occur: N/A  Wheelchair assist level: Minimal Assistance - Patient > 75% Max wheelchair distance: 100    Wheelchair 50 feet with 2 turns activity    Assist    Wheelchair 50 feet with 2 turns activity did not occur: N/A   Assist Level: Minimal Assistance - Patient > 75%   Wheelchair 150 feet activity     Assist Wheelchair 150 feet activity did not occur: N/A   Assist Level: Moderate Assistance - Patient 50 - 74%       Medical Problem List and Plan: 1.  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  Continue lactulose as directed as well as prednisolone x28 days total per GI services as well as Xifaxan for chronic liver disease  -Continue CIR therapies including PT, OT, and SLP  ELOS 5/8  -will need GI/PCP f/u at discharge, substance abuse f/u when more cognitively appropriate. outpt therapies  -24 supervision at home after discharge 2.  Antithrombotics: -DVT/anticoagulation: SCDs             -antiplatelet therapy: N/A 3. Pain Management: Tylenol 4. Mood: Trazodone 25 mg nightly             -antipsychotic agents: N/A 5. Neuropsych: This patient is not capable of making decisions on her own behalf. 6. Skin/Wound Care: Routine skin checks 7. Fluids/Electrolytes/Nutrition: Routine in and outs.  5/8-didn't supplement potassium. Will give a dose today and send home on po potassium  8.  Coagulopathy/thrombocytopenia/chronic macrocytic anemia/leukocytosis.  Leukocytosis felt to be related to prednisolone.  Follow-up chemistries.  Transfuse if symptomatic             Hemoglobin 7.0 on 4/30, 7.8 5/3---> 7.3 5/6  Platelets 53 on 4/30, up to 108k 5/3-->80k 5/6  WBCs 16.9 on 4/30, down to 14.7 5/3---> 9.9 5/6  -lab f/u at discharge    9.  Orthostasis.  ProAmatine 3 times daily.  Controlled on 5/7  Monitor with increased mobility 10.  Decreased nutritional storage.  Follow-up dietary services             Supplementation 11.  Steroid-induced hyperglycemia  Sugars now normal 12.  Transaminitis  LFT's stable to improved  5/3----stable on 5/6   -f/u as outpt LOS: 8 days A FACE TO FACE EVALUATION WAS PERFORMED  [name] 01/16/2020, 8:12 AM

## 2020-01-18 ENCOUNTER — Ambulatory Visit: Payer: Medicaid Other | Attending: Physician Assistant

## 2020-01-18 ENCOUNTER — Encounter: Payer: Self-pay | Admitting: Speech Pathology

## 2020-01-18 ENCOUNTER — Telehealth: Payer: Self-pay | Admitting: *Deleted

## 2020-01-18 ENCOUNTER — Ambulatory Visit: Payer: Medicaid Other | Admitting: Speech Pathology

## 2020-01-18 ENCOUNTER — Other Ambulatory Visit: Payer: Self-pay

## 2020-01-18 DIAGNOSIS — R471 Dysarthria and anarthria: Secondary | ICD-10-CM | POA: Insufficient documentation

## 2020-01-18 DIAGNOSIS — R41841 Cognitive communication deficit: Secondary | ICD-10-CM | POA: Diagnosis present

## 2020-01-18 DIAGNOSIS — R41840 Attention and concentration deficit: Secondary | ICD-10-CM | POA: Insufficient documentation

## 2020-01-18 DIAGNOSIS — M6281 Muscle weakness (generalized): Secondary | ICD-10-CM | POA: Insufficient documentation

## 2020-01-18 DIAGNOSIS — E512 Wernicke's encephalopathy: Secondary | ICD-10-CM | POA: Insufficient documentation

## 2020-01-18 DIAGNOSIS — R2681 Unsteadiness on feet: Secondary | ICD-10-CM | POA: Insufficient documentation

## 2020-01-18 NOTE — Therapy (Signed)
[facility] [address] [place], [place], [address] Phone: [phone]   Fax:  [phone]  Speech Language Pathology Evaluation  Patient Details  Name: Joann [name] MRN: [id number] Date of Birth: 1980/12/19 Referring Provider (SLP): Dr. Alger Simons   Encounter Date: 01/18/2020  End of Session - 01/18/20 1226    Visit Number  1    Number of Visits  17    Date for SLP Re-Evaluation  03/14/20    SLP Start Time  0100    SLP Stop Time   1145    SLP Time Calculation (min)  43 min    Activity Tolerance  Patient tolerated treatment well       Past Medical History:  Diagnosis Date  . Alcohol abuse   . Anxiety   . Hypertension   . Liver disease     Past Surgical History:  Procedure Laterality Date  . ECTOPIC PREGNANCY SURGERY    . ESOPHAGOGASTRODUODENOSCOPY (EGD) WITH PROPOFOL N/A 12/27/2019   Procedure: ESOPHAGOGASTRODUODENOSCOPY (EGD) WITH PROPOFOL;  Surgeon: Carol Ada, MD;  Location: [facility];  Service: Endoscopy;  Laterality: N/A;    There were no vitals filed for this visit.  Subjective Assessment - 01/18/20 1158    Subjective  "It's my memory"    Patient is accompained by:  Family member   spouse, Celesta Gentile   Currently in Pain?  No/denies         SLP Evaluation [facility] - 01/18/20 1158      SLP Visit Information   SLP Received On  01/18/20    Referring Provider (SLP)  Dr. Alger Simons    Onset Date  12/23/19    Medical Diagnosis  Wernike's encephalopathy      Subjective   Patient/Family Stated Goal  "To build my memory and strength and be independent and think and make wise decisions"      General Information   HPI  52 with history of ETOH abuse, alcoholic hepatitis, alcohol pancreatitis, withdrawal seizures, presenting with worsening mental status found to have acute hepatic encephalopathy, anemia, AKI, and coagulapathy. Underwent EGD on 4/18 which showed non bleeding grade 2 varices.  Bleeding from suspected nares 4/22. Intubated 4/22-4/27.    Mobility Status  walks independently - PT eval today      Balance Screen   Has the patient had a decrease in activity level because of a fear of falling?   No    Is the patient reluctant to leave their home because of a fear of falling?   No      Prior Functional Status   Cognitive/Linguistic Baseline  Within functional limits    Type of Home  House     Lives With  Spouse;Family   2 teen sons and adult daughter    Available Support  Family    Vocation  Unemployed      Cognition   Overall Cognitive Status  Impaired/Different from baseline    Area of Impairment  Attention;Memory;Safety/judgement;Awareness;Problem solving    Current Attention Level  Sustained    Attention  Selective;Alternating    Focused Attention  Appears intact    Selective Attention  Impaired    Selective Attention Impairment  Verbal basic;Functional basic    Alternating Attention  Impaired    Alternating Attention Impairment  Verbal basic;Functional basic    Memory  Impaired    Memory Impairment  Decreased short term memory;Decreased recall of new information    Decreased Short Term Memory  Verbal basic;Functional basic    Awareness  Impaired    Awareness Impairment  Intellectual impairment    Problem Solving  Impaired    Problem Solving Impairment  Functional basic;Verbal complex    Executive Function  Reasoning;Sequencing;Organizing;Decision Making;Self Monitoring;Self Correcting      Auditory Comprehension   Overall Auditory Comprehension  Appears within functional limits for tasks assessed      Reading Comprehension   Reading Status  Not tested      Expression   Primary Mode of Expression  Verbal      Verbal Expression   Overall Verbal Expression  Appears within functional limits for tasks assessed      Written Expression   Dominant Hand  Right    Written Expression  Not tested      Oral Motor/Sensory Function   Overall Oral  Motor/Sensory Function  Appears within functional limits for tasks assessed      Motor Speech   Overall Motor Speech  Impaired    Respiration  Impaired    Level of Impairment  Conversation    Phonation  Low vocal intensity    Resonance  Within functional limits    Articulation  Within functional limitis    Intelligibility  Intelligibility reduced    Word  75-100% accurate    Phrase  75-100% accurate    Sentence  75-100% accurate    Conversation  75-100% accurate   in quiet room   Motor Planning  Witnin functional limits    Effective Techniques  Increased vocal intensity      Standardized Assessments   Standardized Assessments   Cognitive Linguistic Quick Test      Cognitive Linguistic Quick Test (Ages 18-69)   Attention  Mild    Memory  Moderate    Executive Function  Severe    Language  Mild    Visuospatial Skills  Moderate    Severity Rating Total  11    Composite Severity Rating  9.4                      SLP Education - 01/18/20 1224    Education Details  areas of impairment, goals, compensations for attention    Person(s) Educated  Patient   spouse left after CLQT initiated   Methods  Explanation;Handout;Verbal cues    Comprehension  Verbal cues required;Need further instruction       SLP Short Term Goals - 01/18/20 1241      SLP SHORT TERM GOAL #1   Title  Pt will use to -do list to complete 3 tasks/chores a day over 2 sessions with rare min A from family    Baseline  Pt is not doing chores/tasks and not using reminders    Time  4    Period  Weeks    Status  New      SLP SHORT TERM GOAL #2   Title  Pt will verbalize emergent awareness of 3 cogntve impairments with rare min A over 3 sessions    Baseline  intellectual awareness of 1 cognitive impairment    Time  4    Period  Weeks    Status  New      SLP SHORT TERM GOAL #3   Title  Pt will meal plan for 1 week including grocery list with occasional min A from family    Baseline  Pt is not  currently doing this, it was her job PTA    Time  4    Period  Weeks    Status  New       SLP Long Term Goals - 01/18/20 1245      SLP LONG TERM GOAL #1   Title  Pt will use external aids to organize and manage medications accurately over 2 sessions    Baseline  daughter is managing her meds    Time  8    Period  Weeks    Status  New      SLP LONG TERM GOAL #2   Title  Pt will utilize compensations for attention, memory, safety to complete light meal prep with rare min A from family over 2 sessions    Baseline  This was pt's job PTA, currently not doing    Time  8    Period  Weeks    Status  New      SLP LONG TERM GOAL #3   Title  Pt will be intelligible and speak at 70-74dB in noisy environment with rare min A    Baseline  sub 70dB, reduced intelligibility reported by spouse    Time  8    Period  Weeks    Status  New       Plan - 01/18/20 1226    Clinical Impression Statement  Joann [name] was hospitalized 12/23/19 to 01/16/20. Prior to hospitalization, she was independent with ADL's/IADL's. She helped prep for meals, cared for her teen children, and did laundry.  She presents with encephalopathy (E51.2) resulting in moderate cognitive communication impairments (R41.841) and mild dysarthria (R47.1). Sposue reports she has had low volume since extubation and has reduced intellgiblity at home.Joann [name] verbalizes only 1 cogntive impairment of memory. No awareness of attention, executive function, visuospatial impairments. She is keeping a journal to help her recall her day, however she is not keeping a list of tasks she needs to complete. Family is doing all tasks for her. Prior to this, Joann [name] was responsible for keeping a grocery list and meal planning. Her husband Celesta Gentile is managing her medical appointments and informatoin. Joann [name]'s adult daughter is managing her medications.I recommend skilled ST to maximize cognitive communication skills for safety, to reduce her family's burden and improve  independence.    Speech Therapy Frequency  2x / week    Duration  --   8 weeks or 17 visits   Treatment/Interventions  Language facilitation;Environmental controls;Cueing hierarchy;SLP instruction and feedback;Compensatory strategies;Functional tasks;Cognitive reorganization;Compensatory techniques;Patient/family education;Multimodal communcation approach;Internal/external aids    Potential to Achieve Goals  Good    Potential Considerations  Ability to learn/carryover information;Severity of impairments       Patient will benefit from skilled therapeutic intervention in order to improve the following deficits and impairments:   Cognitive communication deficit  Dysarthria and anarthria    Problem List Patient Active Problem List   Diagnosis Date Noted  . Reactive depression   . Wernicke's encephalopathy   . Transaminitis   . Steroid-induced hyperglycemia   . Leucocytosis   . Orthostasis   . Macrocytic anemia   . AMS (altered mental status)   . Sleep disturbance   . Pressure injury of skin 01/05/2020  . Alcoholic hepatitis with ascites   . Acute respiratory failure ([place])   . Palliative care encounter   . Elevated bilirubin   . Ascites   . Advanced care planning/counseling discussion   . Goals of care, counseling/discussion   . Palliative care by specialist   . Hypoalbuminemia   . Hepatic encephalopathy ([place])  12/24/2019  . Alcoholic hepatitis without ascites   . Hypokalemia due to inadequate potassium intake 04/02/2019  . Alcohol withdrawal ([facility]) 10/24/2018  . Alcohol dependence with withdrawal, unspecified ([facility]) 10/24/2018  . Elevated LFTs 10/24/2018  . Thrombocytopenia ([place]) 10/24/2018  . Alcohol-induced acute pancreatitis   . Acute blood loss anemia   . Alcoholic ketoacidosis 54/30/1484  . Alcohol abuse with alcohol-induced mood disorder ([place]) 09/14/2017  . Elevated liver enzymes 04/29/2017    Scotlynn Noyes, Annye Rusk MS, CCC-SLP 01/18/2020, 12:51 PM  [facility] [address] [place], [place], [address] Phone: [phone]   Fax:  [phone]  Name: NAYELIS BONITO MRN: [id number] Date of Birth: 19-Sep-1980

## 2020-01-18 NOTE — Therapy (Signed)
[facility] [address] [place], [place], [address] Phone: [phone]   Fax:  [phone]  Physical Therapy Evaluation  Patient Details  Name: Joann [name] MRN: [id number] Date of Birth: [birth year] Referring Provider (PT): Dr. Leeroy Cha   Encounter Date: 01/18/2020  PT End of Session - 01/18/20 1139    Visit Number  1    Number of Visits  19    Date for PT Re-Evaluation  03/14/20    Authorization Type  Medicaid    PT Start Time  1015    PT Stop Time  1100    PT Time Calculation (min)  45 min    Equipment Utilized During Treatment  Gait belt    Activity Tolerance  Patient tolerated treatment well    Behavior During Therapy  [facility] for tasks assessed/performed       Past Medical History:  Diagnosis Date  . Alcohol abuse   . Anxiety   . Hypertension   . Liver disease     Past Surgical History:  Procedure Laterality Date  . ECTOPIC PREGNANCY SURGERY    . ESOPHAGOGASTRODUODENOSCOPY (EGD) WITH PROPOFOL N/A 12/27/2019   Procedure: ESOPHAGOGASTRODUODENOSCOPY (EGD) WITH PROPOFOL;  Surgeon: Carol Ada, MD;  Location: [facility];  Service: Endoscopy;  Laterality: N/A;    There were no vitals filed for this visit.   Subjective Assessment - 01/18/20 1108    Subjective  12/24/19: admitted to ED for altered mental status due to hepatic encephalopathy. She has hx of alcohol abuse. Was in hospital from 12/24/19 to 01/16/20. Returned home and husband and her have been working on gradually getting her to walk more as she can tolerate it. She is trying to walk while holding on to grocery cart when shopping. Daughter is at home for couple of months to help out.    Patient is accompained by:  Family member   Husband   Pertinent History  hepatic encephalopathy, Wernicke's encephalopathy    Limitations  Standing;Walking;House hold activities;Lifting    How long can you sit comfortably?  no issues    How long can you stand  comfortably?  5 min    How long can you walk comfortably?  15-20 min with support of cart    Patient Stated Goals  "get stronger"    Currently in Pain?  No/denies         [facility] PT Assessment - 01/18/20 1043      Assessment   Medical Diagnosis  hepatic encephalopathy    Referring Provider (PT)  Dr. Leeroy Cha    Onset Date/Surgical Date  12/24/19    Hand Dominance  Right    Prior Therapy  inpatient PT, OT, Speech      Precautions   Precautions  Fall      Restrictions   Weight Bearing Restrictions  No      Balance Screen   Has the patient fallen in the past 6 months  No    Has the patient had a decrease in activity level because of a fear of falling?   No    Is the patient reluctant to leave their home because of a fear of falling?   No      Home Environment   Living Environment  Private residence    Living Arrangements  Spouse/significant other;Children    Available Help at Discharge  Family    Type of [place] Access  Level entry  Home Layout  Two level    Alternate Level Stairs-Number of Steps  10    Alternate Level Stairs-Rails  Right    Home Equipment  None      Prior Function   Level of Independence  Independent    Vocation  On disability    Vocation Requirements  Was LPN, covid Nurse, wants to return back to work      Cognition   Overall Cognitive Status  History of cognitive impairments - at baseline    Attention  Focused    Focused Attention  Appears intact    Memory  Impaired    Memory Impairment  Decreased short term memory      ROM / Strength   AROM / PROM / Strength  Strength      Strength   Strength Assessment Site  Hip;Knee;Ankle    Right/Left Hip  Right;Left    Right Hip Flexion  4/5    Right Hip ABduction  4/5    Right Hip ADduction  4/5    Left Hip Flexion  3+/5    Left Hip ABduction  3+/5    Left Hip ADduction  3+/5    Right/Left Knee  Right;Left    Right Knee Flexion  5/5    Right Knee Extension  4/5    Left Knee  Flexion  5/5    Left Knee Extension  4/5    Right/Left Ankle  Right;Left    Right Ankle Dorsiflexion  4/5    Right Ankle Plantar Flexion  2+/5    Left Ankle Dorsiflexion  4/5    Left Ankle Plantar Flexion  2+/5      Transfers   Transfers  Sit to Stand;Stand to Sit    Sit to Stand  7: Independent      Ambulation/Gait   Ambulation/Gait  Yes    Ambulation/Gait Assistance  7: Independent    Ambulation Distance (Feet)  115 Feet    Assistive device  None      Standardized Balance Assessment   Standardized Balance Assessment  Berg Balance Test;Timed Up and Go Test;10 meter walk test;Five Times Sit to Stand    Five times sit to stand comments   25 seconds   with use of Bil UE, pt unabel to do it without UE support   10 Meter Walk  0.36ms   Norm = 0.856 sec (age in 41s     Berg Balance Test   Sit to Stand  Able to stand  independently using hands    Standing Unsupported  Able to stand safely 2 minutes    Sitting with Back Unsupported but Feet Supported on Floor or Stool  Able to sit safely and securely 2 minutes    Stand to Sit  Sits safely with minimal use of hands    Transfers  Able to transfer safely, minor use of hands    Standing Unsupported with Eyes Closed  Able to stand 10 seconds safely    Standing Unsupported with Feet Together  Able to place feet together independently and stand 1 minute safely    From Standing, Reach Forward with Outstretched Arm  Can reach confidently >25 cm (10")    From Standing Position, Pick up Object from Floor  Able to pick up shoe safely and easily    From Standing Position, Turn to Look Behind Over each Shoulder  Looks behind from both sides and weight shifts well    Turn 360 Degrees  Able to turn  360 degrees safely in 4 seconds or less    Standing Unsupported, Alternately Place Feet on Step/Stool  Able to stand independently and safely and complete 8 steps in 20 seconds    Standing Unsupported, One Foot in Front  Able to plae foot ahead of the  other independently and hold 30 seconds    Standing on One Leg  Tries to lift leg/unable to hold 3 seconds but remains standing independently    Total Score  51      Timed Up and Go Test   Normal TUG (seconds)  13                Objective measurements completed on examination: See above findings.    Treatment: Fwd step ups: 6" box: one HHA: 10x R and L Bil heel raises: 2 x 10        PT Education - 01/18/20 1122    Education Details  Pt and husband educated on evaluation findings and POC, educated on beneifts of gradual walking program and gradual return to [employer]) Educated  Patient;Spouse    Methods  Explanation    Comprehension  Verbalized understanding       PT Short Term Goals - 01/18/20 1122      PT SHORT TERM GOAL #1   Title  Patient will be able to stand for 15 min to be able to cook a small meal for her self.    Baseline  5 min    Time  3    Period  Weeks    Status  New    Target Date  02/08/20      PT SHORT TERM GOAL #2   Title  Pt will be able to ambulate for 30 min of continous walk to improve walking endurance to improve community ambulation    Baseline  15-20 min    Time  3    Period  Weeks    Status  New    Target Date  02/08/20      PT SHORT TERM GOAL #3   Title  Pt will be able to carry 5 lb for 115' to assist with getting grocery bags from car to home    Baseline  not attempted    Time  3    Period  Weeks    Status  New    Target Date  02/08/20      PT SHORT TERM GOAL #4   Title  Pt will be able to perform 5 x unilateral heel raises with bil HHA to improve strength of bil plantarflexors    Baseline  unable    Time  3    Period  Weeks    Status  New    Target Date  02/08/20      PT SHORT TERM GOAL #5   Title  Pt will be able to perform 1x sit to stand without use of HHA to improve functional strength    Baseline  unable to without HHA support    Time  3    Period  Weeks    Status  New      Additional Short Term  Goals   Additional Short Term Goals  Yes      PT SHORT TERM GOAL #6   Title  Pt will be able to get in and out of her husband's truck with SBA to improve functional transfers    Baseline  min to mod A provided  by husband    Time  3    Period  Weeks    Status  New    Target Date  02/08/20        PT Long Term Goals - 01/18/20 1127      PT LONG TERM GOAL #1   Title  Pt will demo 56/56 on BBS to improve overall balance and reduce fall risk    Baseline  51/56    Time  8    Period  Weeks    Status  New    Target Date  03/14/20      PT LONG TERM GOAL #2   Title  Pt will be able to perform 5x sit to stand without use of HHA in under 20 seconds to improve overall functional strength    Baseline  25 sec with bil HHA assist    Time  8    Period  Weeks    Status  New    Target Date  03/14/20      PT LONG TERM GOAL #3   Title  Pt will be able to ambulate 1 mile in under 25 min to improve functional ambulation and to return to PLOF.    Baseline  not attempted to walk a mile yet    Time  8    Period  Weeks    Status  New    Target Date  03/14/20      PT LONG TERM GOAL #4   Title  Pt will demo 5/5 strength in Bil LE to improve overall strength and transfers.    Baseline  3+-4/5 grossly    Time  8    Period  Weeks    Status  New    Target Date  03/14/20             Plan - 01/18/20 1133    Clinical Impression Statement  Patient is a 39 y.o. female who was seen today for physical therapy evaluation and treatment for generalized weakness from recent hospital admission for hepatic encephalopathy. Objective impairments include decreased balance, decreased strength of bil LE, decreased functional transfers, decreased walking endurance. These impairements are limiting patient from perform self care, Patient will benefit from skilled PT to address these impairments and improve overall function.    Personal Factors and Comorbidities  Time since onset of  injury/illness/exacerbation;Transportation    Examination-Activity Limitations  Caring for Others;Carry;Lift;Locomotion Level;Squat;Stairs;Stand;Transfers    Examination-Participation Restrictions  Cleaning;Meal Prep;Shop;Yard Work;Laundry;Driving;Community Activity    Stability/Clinical Decision Making  Evolving/Moderate complexity    Clinical Decision Making  Moderate    Rehab Potential  Good    PT Frequency  2x / week    PT Duration  8 weeks    PT Treatment/Interventions  ADLs/Self Care Home Management;Gait training;Stair training;Functional mobility training;Therapeutic activities;Therapeutic exercise;Balance training;Neuromuscular re-education;Manual techniques;Patient/family education    PT Next Visit Plan  Review and progress HEP, add balance exercises       Patient will benefit from skilled therapeutic intervention in order to improve the following deficits and impairments:  Abnormal gait, Decreased balance, Decreased cognition, Decreased endurance, Decreased strength, Decreased mobility, Difficulty walking  Visit Diagnosis: Muscle weakness (generalized)  Unsteadiness on feet     Problem List Patient Active Problem List   Diagnosis Date Noted  . Reactive depression   . Wernicke's encephalopathy   . Transaminitis   . Steroid-induced hyperglycemia   . Leucocytosis   . Orthostasis   . Macrocytic anemia   . AMS (  altered mental status)   . Sleep disturbance   . Pressure injury of skin 01/05/2020  . Alcoholic hepatitis with ascites   . Acute respiratory failure ([place])   . Palliative care encounter   . Elevated bilirubin   . Ascites   . Advanced care planning/counseling discussion   . Goals of care, counseling/discussion   . Palliative care by specialist   . Hypoalbuminemia   . Hepatic encephalopathy ([place]) 12/24/2019  . Alcoholic hepatitis without ascites   . Hypokalemia due to inadequate potassium intake 04/02/2019  . Alcohol withdrawal ([place]) 10/24/2018  . Alcohol  dependence with withdrawal, unspecified ([place]) 10/24/2018  . Elevated LFTs 10/24/2018  . Thrombocytopenia ([place]) 10/24/2018  . Alcohol-induced acute pancreatitis   . Acute blood loss anemia   . Alcoholic ketoacidosis 88/32/5498  . Alcohol abuse with alcohol-induced mood disorder ([place]) 09/14/2017  . Elevated liver enzymes 04/29/2017    Kerrie Pleasure 01/18/2020, 11:42 AM  [facility] [address] [place], [place], [address] Phone: [phone]   Fax:  [phone]  Name: LAVITA PONTIUS MRN: [id number] Date of Birth: Apr 15, 1981

## 2020-01-18 NOTE — Addendum Note (Signed)
Addended by: Kerrie Pleasure on: 01/18/2020 11:46 AM   Modules accepted: Orders

## 2020-01-18 NOTE — Telephone Encounter (Signed)
Hosp F/U made for 02/01/20 to arrive at 1:40 for 2:00 with Dr. Ranell Patrick. Mr Joann [name] notified and given appointment information.

## 2020-01-18 NOTE — Patient Instructions (Signed)
   Get a 3 ring binder for PT/OT/ST and keep your calendar and journal in it  Bring it to each session  Use a to do list with 3 chores a day   Tips to help facilitate better attention, concentration, focus   Do harder, longer tasks when you are most alert/awake  Break down larger tasks into small parts  Limit distractions of TV, radio, conversation, e mails/texts, appliance noise, etc - if a job is important, do it in a quiet room  Be aware of how you are functioning in high stimulation environments such as large stores, parties, restaurants - any place with lots of lights, noise, signs etc  Group conversations may be more difficult to process than one on one conversations  Give yourself extra time to process conversation, reading materials, directions or information from your healthcare providers  Organization is key - clutters of laundry, mail, paperwork, dirty dishes - all make it more difficult to concentrate  Before you start a task, have all the needed supplies, directions, recipes ready and organized. This way you don't have to go looking for something in the middle of a task and become distracted.   Be aware of fatigue - take rests or breaks when needed to re-group and re-focus

## 2020-01-21 ENCOUNTER — Telehealth: Payer: Self-pay

## 2020-01-21 NOTE — Telephone Encounter (Signed)
Transitional Care call-Terrance/Husband    1. Are you/is patient experiencing any problems since coming home? No Are there any questions regarding any aspect of care? No 2. Are there any questions regarding medications administration/dosing? No Are meds being taken as prescribed? Yes Patient should review meds with caller to confirm 3. Have there been any falls? No 4. Has Home Health been to the house and/or have they contacted you? Outpt therapy If not, have you tried to contact them? Can we help you contact them? 5. Are bowels and bladder emptying properly? Yes  Are there any unexpected incontinence issues? No If applicable, is patient following bowel/bladder programs? 6. Any fevers, problems with breathing, unexpected pain? No 7. Are there any skin problems or new areas of breakdown? No 8. Has the patient/family member arranged specialty MD follow up (ie cardiology/neurology/renal/surgical/etc)? Yes  Can we help arrange? 9. Does the patient need any other services or support that we can help arrange? No 10. Are caregivers following through as expected in assisting the patient? Yes 11. Has the patient quit smoking, drinking alcohol, or using drugs as recommended? Yes  Appointment time 2:00 pm, arrive time 1:40 pm with Dr. Ranell Patrick [address] [phone]

## 2020-01-25 ENCOUNTER — Other Ambulatory Visit: Payer: Self-pay

## 2020-01-25 ENCOUNTER — Emergency Department (HOSPITAL_COMMUNITY)
Admission: EM | Admit: 2020-01-25 | Discharge: 2020-01-26 | Disposition: A | Discharge status: Home / Self Care | Payer: Medicaid Other | Attending: Emergency Medicine | Admitting: Emergency Medicine

## 2020-01-25 ENCOUNTER — Encounter (HOSPITAL_COMMUNITY): Payer: Self-pay | Admitting: Emergency Medicine

## 2020-01-25 DIAGNOSIS — Z5321 Procedure and treatment not carried out due to patient leaving prior to being seen by health care provider: Secondary | ICD-10-CM | POA: Insufficient documentation

## 2020-01-25 DIAGNOSIS — R2242 Localized swelling, mass and lump, left lower limb: Secondary | ICD-10-CM | POA: Insufficient documentation

## 2020-01-25 NOTE — ED Notes (Signed)
Patients daughter comes up to say that her mother doesn't want to stay any longer d/t the wait time and being in pain. Advised pt to stay. Daughter states her mom will come back if pain persists or gets worse.

## 2020-01-25 NOTE — ED Triage Notes (Signed)
Pt reports left ankle swelling.  Ankle is red and swollen, pt does not remember injuring it.

## 2020-01-26 ENCOUNTER — Ambulatory Visit: Payer: Medicaid Other | Admitting: Occupational Therapy

## 2020-01-26 ENCOUNTER — Ambulatory Visit: Payer: Medicaid Other

## 2020-01-26 ENCOUNTER — Encounter: Payer: Self-pay | Admitting: Occupational Therapy

## 2020-01-26 DIAGNOSIS — R2681 Unsteadiness on feet: Secondary | ICD-10-CM

## 2020-01-26 DIAGNOSIS — E512 Wernicke's encephalopathy: Secondary | ICD-10-CM

## 2020-01-26 DIAGNOSIS — M6281 Muscle weakness (generalized): Secondary | ICD-10-CM

## 2020-01-26 DIAGNOSIS — R41840 Attention and concentration deficit: Secondary | ICD-10-CM

## 2020-01-26 NOTE — Therapy (Addendum)
[facility] [address] [place], [place], [address] Phone: [phone]   Fax:  [phone]  Physical Therapy Treatment - Arrived Visit (No Charge)  Patient Details  Name: Joann [name] MRN: [id number] Date of Birth: [birth year] Referring Provider (PT): Dr. Leeroy Cha   Encounter Date: 01/26/2020  PT End of Session - 01/26/20 1310    Visit Number  2    Number of Visits  19    Date for PT Re-Evaluation  03/14/20    Authorization Type  Medicaid    PT Start Time  1232   arrive - no charge visit   PT Stop Time  1300   arrive - no charge visit   PT Time Calculation (min)  28 min    Equipment Utilized During Treatment  Gait belt    Activity Tolerance  Patient tolerated treatment well    Behavior During Therapy  [facility] for tasks assessed/performed       Past Medical History:  Diagnosis Date  . Alcohol abuse   . Anxiety   . Hypertension   . Liver disease     Past Surgical History:  Procedure Laterality Date  . ECTOPIC PREGNANCY SURGERY    . ESOPHAGOGASTRODUODENOSCOPY (EGD) WITH PROPOFOL N/A 12/27/2019   Procedure: ESOPHAGOGASTRODUODENOSCOPY (EGD) WITH PROPOFOL;  Surgeon: Carol Ada, MD;  Location: [facility];  Service: Endoscopy;  Laterality: N/A;    There were no vitals filed for this visit.  Subjective Assessment - 01/26/20 1245    Subjective  Patient experienced a unwitnessed fall coming into therapy session causing a small skin tear on the L knee. Patient reports having a horse fly bite on the left foot that has caused some swelling, increased pain, and tenderness. Believes this may have contributed to the fall.    Patient is accompained by:  Family member   Husband   Pertinent History  hepatic encephalopathy, Wernicke's encephalopathy    Limitations  Standing;Walking;House hold activities;Lifting    How long can you sit comfortably?  no issues    How long can you stand comfortably?  5 min    How long  can you walk comfortably?  15-20 min with support of cart    Patient Stated Goals  "get stronger"    Currently in Pain?  Yes    Pain Score  2     Pain Location  Knee    Pain Orientation  Left    Pain Descriptors / Indicators  Other (Comment)    Pain Type  Acute pain    Pain Onset  Today    Multiple Pain Sites  Yes    Pain Score  7    Pain Location  Foot    Pain Orientation  Left    Pain Descriptors / Indicators  Burning;Aching    Pain Type  Acute pain    Pain Onset  In the past 7 days    Pain Frequency  Intermittent    Aggravating Factors   moving    Pain Relieving Factors  rest                                PT Education - 01/26/20 1308    Education Details  Educated patient to contact PCP due to increased swelling, pain, and tenderness on left foot due to horsefly bite.    Person(s) Educated  Patient    Methods  Explanation  Comprehension  Verbalized understanding       PT Short Term Goals - 01/18/20 1122      PT SHORT TERM GOAL #1   Title  Patient will be able to stand for 15 min to be able to cook a small meal for her self.    Baseline  5 min    Time  3    Period  Weeks    Status  New    Target Date  02/08/20      PT SHORT TERM GOAL #2   Title  Pt will be able to ambulate for 30 min of continous walk to improve walking endurance to improve community ambulation    Baseline  15-20 min    Time  3    Period  Weeks    Status  New    Target Date  02/08/20      PT SHORT TERM GOAL #3   Title  Pt will be able to carry 5 lb for 115' to assist with getting grocery bags from car to home    Baseline  not attempted    Time  3    Period  Weeks    Status  New    Target Date  02/08/20      PT SHORT TERM GOAL #4   Title  Pt will be able to perform 5 x unilateral heel raises with bil HHA to improve strength of bil plantarflexors    Baseline  unable    Time  3    Period  Weeks    Status  New    Target Date  02/08/20      PT SHORT TERM GOAL #5    Title  Pt will be able to perform 1x sit to stand without use of HHA to improve functional strength    Baseline  unable to without HHA support    Time  3    Period  Weeks    Status  New      Additional Short Term Goals   Additional Short Term Goals  Yes      PT SHORT TERM GOAL #6   Title  Pt will be able to get in and out of her husband's truck with SBA to improve functional transfers    Baseline  min to mod A provided by husband    Time  3    Period  Weeks    Status  New    Target Date  02/08/20        PT Long Term Goals - 01/18/20 1127      PT LONG TERM GOAL #1   Title  Pt will demo 56/56 on BBS to improve overall balance and reduce fall risk    Baseline  51/56    Time  8    Period  Weeks    Status  New    Target Date  03/14/20      PT LONG TERM GOAL #2   Title  Pt will be able to perform 5x sit to stand without use of HHA in under 20 seconds to improve overall functional strength    Baseline  25 sec with bil HHA assist    Time  8    Period  Weeks    Status  New    Target Date  03/14/20      PT LONG TERM GOAL #3   Title  Pt will be able to ambulate 1 mile in under 25 min  to improve functional ambulation and to return to PLOF.    Baseline  not attempted to walk a mile yet    Time  8    Period  Weeks    Status  New    Target Date  03/14/20      PT LONG TERM GOAL #4   Title  Pt will demo 5/5 strength in Bil LE to improve overall strength and transfers.    Baseline  3+-4/5 grossly    Time  8    Period  Weeks    Status  New    Target Date  03/14/20            Plan - 01/26/20 1311    Clinical Impression Statement  When coming in for therapy session today, patient had a fall upon the front entrance of the building that was unwitnessed by staff and reported by another fellow patient. Patient recieved help from PTs and PT tech to safely transfer from floor to wc. Patient had a small abrasion to the L knee, which PT spent time to properly assess, clean, and  bandage. Pt reporting minimal pain (2/10) on the L knee. Patient did report increased pain, swelling, and tenderness on the L foot due to a horsefly bite that she recieved approx 3 days ago. Upon asssessment, patient has increased swelling in L foot and was sensitive to touch. PT educating patient to contact PCP as soon as possible, with patient verbalizing agreement. Due to pain in the L foot upon standing, patient arrived - no charge.    Personal Factors and Comorbidities  Time since onset of injury/illness/exacerbation;Transportation    Examination-Activity Limitations  Caring for Others;Carry;Lift;Locomotion Level;Squat;Stairs;Stand;Transfers    Examination-Participation Restrictions  Cleaning;Meal Prep;Shop;Yard Work;Laundry;Driving;Community Activity    Stability/Clinical Decision Making  Evolving/Moderate complexity    Rehab Potential  Good    PT Frequency  2x / week    PT Duration  8 weeks    PT Treatment/Interventions  ADLs/Self Care Home Management;Gait training;Stair training;Functional mobility training;Therapeutic activities;Therapeutic exercise;Balance training;Neuromuscular re-education;Manual techniques;Patient/family education    PT Next Visit Plan  Review and progress HEP, add balance exercises       Patient will benefit from skilled therapeutic intervention in order to improve the following deficits and impairments:  Abnormal gait, Decreased balance, Decreased cognition, Decreased endurance, Decreased strength, Decreased mobility, Difficulty walking  Visit Diagnosis: Muscle weakness (generalized)  Unsteadiness on feet  Wernicke's encephalopathy     Problem List Patient Active Problem List   Diagnosis Date Noted  . Reactive depression   . Wernicke's encephalopathy   . Transaminitis   . Steroid-induced hyperglycemia   . Leucocytosis   . Orthostasis   . Macrocytic anemia   . AMS (altered mental status)   . Sleep disturbance   . Pressure injury of skin 01/05/2020   . Alcoholic hepatitis with ascites   . Acute respiratory failure ([place])   . Palliative care encounter   . Elevated bilirubin   . Ascites   . Advanced care planning/counseling discussion   . Goals of care, counseling/discussion   . Palliative care by specialist   . Hypoalbuminemia   . Hepatic encephalopathy ([place]) 12/24/2019  . Alcoholic hepatitis without ascites   . Hypokalemia due to inadequate potassium intake 04/02/2019  . Alcohol withdrawal ([place]) 10/24/2018  . Alcohol dependence with withdrawal, unspecified ([place]) 10/24/2018  . Elevated LFTs 10/24/2018  . Thrombocytopenia ([place]) 10/24/2018  . Alcohol-induced acute pancreatitis   . Acute blood loss anemia   .  Alcoholic ketoacidosis 68/86/4847  . Alcohol abuse with alcohol-induced mood disorder ([place]) 09/14/2017  . Elevated liver enzymes 04/29/2017    Jones Bales, PT, DPT 01/26/2020, 3:38 PM  [facility] [address] [place], [place], [address] Phone: [phone]   Fax:  [phone]  Name: TABATHIA KNOCHE MRN: [id number] Date of Birth: 1980-12-15

## 2020-01-26 NOTE — Therapy (Signed)
[facility] [address] [place], [place], [address] Phone: [phone]   Fax:  [phone]  Occupational Therapy Evaluation  Patient Details  Name: Joann [name] MRN: [id number] Date of Birth: 1980-12-10 Referring Provider (OT): Marlowe Sh[place]   Encounter Date: 01/26/2020  OT End of Session - 01/26/20 1605    Visit Number  1    Number of Visits  1       Past Medical History:  Diagnosis Date  . Alcohol abuse   . Anxiety   . Hypertension   . Liver disease     Past Surgical History:  Procedure Laterality Date  . ECTOPIC PREGNANCY SURGERY    . ESOPHAGOGASTRODUODENOSCOPY (EGD) WITH PROPOFOL N/A 12/27/2019   Procedure: ESOPHAGOGASTRODUODENOSCOPY (EGD) WITH PROPOFOL;  Surgeon: Carol Ada, MD;  Location: [facility];  Service: Endoscopy;  Laterality: N/A;    There were no vitals filed for this visit.  Subjective Assessment - 01/26/20 1324    Subjective   I don't think I need Occupational - I can make my bed and stuff    Patient is accompanied by:  Family member   Daughter Jen Mow OT Assessment - 01/26/20 0001      Assessment   Medical Diagnosis  hepatic encephalopathy    Referring Provider (OT)  Linna Hoff [place]    Onset Date/Surgical Date  12/24/19    Hand Dominance  Right    Next MD Visit  02/01/20    Prior Therapy  Inpatient rehab      Precautions   Precautions  Fall      Restrictions   Weight Bearing Restrictions  No      Balance Screen   Has the patient fallen in the past 6 months  Yes      ADL   Eating/Feeding  Independent    Grooming  Independent    Upper Body Bathing  Minimal assistance    Lower Body Bathing  Minimal assistance    Upper Body Dressing  Independent    Lower Body Dressing  Minimal assistance    Toilet Transfer  Modified independent    [place] Transfer  Minimal assistance    Tub/Shower Transfer Details (indicate cue type and reason  Sitting on floor of tub, needs help getting out of tub.  Discussed tub seat    ADL comments  Has a bedside commode - does not like to use in shower      IADL   Prior Level of [place] for transportation    Prior Level of Function Light Housekeeping  Independent    Light Housekeeping  Performs light daily tasks such as dishwashing, bed making    Prior Level of Function Meal Prep  Independent    Meal Prep  Able to complete simple warm meal prep    Prior Level of Function Medication Managment  independent    Medication Management  Takes responsibility if medication is prepared in advance in seperate dosage    Prior Level of Function Financial Management  Independent    Financial Management  Requires supervision/minimal cuing      Written Expression   Dominant Hand  Right    Handwriting  100% legible      Vision - History   Baseline Vision  No visual deficits  Patient Visual Report  Other (comment)      Vision Assessment   Eye Alignment  Within Functional Limits    Ocular Range of Motion  Within Functional Limits    Alignment/Gaze Preference  Within Defined Limits    Tracking/Visual Pursuits  Able to track stimulus in all quads without difficulty      Activity Tolerance   Activity Tolerance  Endurance does not limit participation in activity      Cognition   Area of Impairment  Attention;Awareness    Attention  Selective    Selective Attention Impairment  Verbal basic;Functional basic      Posture/Postural Control   Posture/Postural Control  Postural limitations    Postural Limitations  Flexed trunk    Posture Comments  Poorly graded trunk control      Sensation   Light Touch  Appears Intact    Stereognosis  Appears Intact    Proprioception  Appears Intact      Coordination   Gross Motor Movements are Fluid and Coordinated  No    Fine Motor Movements are Fluid and  Coordinated  No    Finger Nose Finger Test  undershooting    9 Hole Peg Test  Right;Left    Right 9 Hole Peg Test  27.53    Left 9 Hole Peg Test  26.50      ROM / Strength   AROM / PROM / Strength  AROM;Strength      AROM   Overall AROM   Within functional limits for tasks performed      Strength   Overall Strength  Within functional limits for tasks performed    Overall Strength Comments  BUE      Hand Function   Right Hand Gross Grasp  Functional    Right Hand Grip (lbs)  40    Right Hand Lateral Pinch  12 lbs    Left Hand Gross Grasp  Functional    Left Hand Grip (lbs)  40    Left Hand Lateral Pinch  12 lbs                      OT Education - 01/26/20 1025    Education Details  Results of OT eval    Person(s) Educated  Patient;Child(ren)    Methods  Explanation    Comprehension  Verbalized understanding                 Plan - 01/26/20 1605    Clinical Impression Statement  Patient is a 39 year old woman with heaptic encephalopathy, recently discharged from inpatient rehab. Patient here with daughter today, and reporting - I don't think I need OT - I am doing well at home.  Patient reports needing only minimal assistance to get out of tub.  Patient wants to use her benefit for PT/SLP services.  Patient requires little assitance for ADL/and is reporting more involvement in IADL than prior to hospitalization.  Patient has generalized weakness, and mild tremor in RUE>LUE reporting ammonia levels still slightly elevated.  No further OT warranted at this time.  Patient and daughter in agreement.    OT Occupational Profile and History  Problem Focused Assessment - Including review of records relating to presenting problem    Body Structure / Function / Physical Skills  Balance    Cognitive Skills  Attention    Clinical Decision Making  Limited treatment options, no task modification necessary    Comorbidities Affecting  Occupational Performance:  May have  comorbidities impacting occupational performance    Modification or Assistance to Complete Evaluation   No modification of tasks or assist necessary to complete eval    Consulted and Agree with Plan of Care  Patient;Family member/caregiver       Patient will benefit from skilled therapeutic intervention in order to improve the following deficits and impairments:   Body Structure / Function / Physical Skills: Balance Cognitive Skills: Attention     Visit Diagnosis: Unsteadiness on feet - Plan: Ot plan of care cert/re-cert  Attention and concentration deficit - Plan: Ot plan of care cert/re-cert    Problem List Patient Active Problem List   Diagnosis Date Noted  . Reactive depression   . Wernicke's encephalopathy   . Transaminitis   . Steroid-induced hyperglycemia   . Leucocytosis   . Orthostasis   . Macrocytic anemia   . AMS (altered mental status)   . Sleep disturbance   . Pressure injury of skin 01/05/2020  . Alcoholic hepatitis with ascites   . Acute respiratory failure ([facility])   . Palliative care encounter   . Elevated bilirubin   . Ascites   . Advanced care planning/counseling discussion   . Goals of care, counseling/discussion   . Palliative care by specialist   . Hypoalbuminemia   . Hepatic encephalopathy ([facility]) 12/24/2019  . Alcoholic hepatitis without ascites   . Hypokalemia due to inadequate potassium intake 04/02/2019  . Alcohol withdrawal ([facility]) 10/24/2018  . Alcohol dependence with withdrawal, unspecified ([facility]) 10/24/2018  . Elevated LFTs 10/24/2018  . Thrombocytopenia ([facility]) 10/24/2018  . Alcohol-induced acute pancreatitis   . Acute blood loss anemia   . Alcoholic ketoacidosis 10/21/1550  . Alcohol abuse with alcohol-induced mood disorder ([facility]) 09/14/2017  . Elevated liver enzymes 04/29/2017    Mariah Milling, OTR/L 01/26/2020, 4:11 PM  [facility] [address] [place], [place],  [address] Phone: [phone]   Fax:  [phone]  Name: CHRISTI WIRICK MRN: [id number] Date of Birth: 1980-09-28

## 2020-01-28 ENCOUNTER — Ambulatory Visit: Payer: Medicaid Other

## 2020-01-28 ENCOUNTER — Other Ambulatory Visit: Payer: Self-pay

## 2020-01-28 ENCOUNTER — Ambulatory Visit: Payer: Medicaid Other | Admitting: Speech Pathology

## 2020-01-28 DIAGNOSIS — R41841 Cognitive communication deficit: Secondary | ICD-10-CM

## 2020-01-28 DIAGNOSIS — M6281 Muscle weakness (generalized): Secondary | ICD-10-CM | POA: Diagnosis not present

## 2020-01-28 NOTE — Therapy (Signed)
[facility] [address] [place], [place], [address] Phone: [phone]   Fax:  [phone]  Speech Language Pathology Treatment  Patient Details  Name: Joann [name] MRN: [id number] Date of Birth: Jan 10, 1981 Referring Provider (SLP): Dr. Alger Simons   Encounter Date: 01/28/2020  End of Session - 01/28/20 1148    Visit Number  2    Number of Visits  17    Date for SLP Re-Evaluation  03/14/20    Authorization Type  medicaid - Pt receiving PT and OT evals - PT requesting 12, I will request 10    Authorization Time Period  medicaid approved 10 visits 5/20-03/16/20    Authorization - Visit Number  1    Authorization - Number of Visits  10    SLP Start Time  1104    SLP Stop Time   1145    SLP Time Calculation (min)  41 min    Activity Tolerance  Patient tolerated treatment well       Past Medical History:  Diagnosis Date  . Alcohol abuse   . Anxiety   . Hypertension   . Liver disease     Past Surgical History:  Procedure Laterality Date  . ECTOPIC PREGNANCY SURGERY    . ESOPHAGOGASTRODUODENOSCOPY (EGD) WITH PROPOFOL N/A 12/27/2019   Procedure: ESOPHAGOGASTRODUODENOSCOPY (EGD) WITH PROPOFOL;  Surgeon: Carol Ada, MD;  Location: [facility];  Service: Endoscopy;  Laterality: N/A;    There were no vitals filed for this visit.  Subjective Assessment - 01/28/20 1106    Subjective  "I brought my folder."    Patient is accompained by:  Family member   husband Izora Gala   Currently in Pain?  Yes    Pain Score  7     Pain Location  Foot    Pain Orientation  Right;Left            ADULT SLP TREATMENT - 01/28/20 1108      General Information   Behavior/Cognition  Alert;Cooperative      Treatment Provided   Treatment provided  Cognitive-Linquistic      Cognitive-Linquistic Treatment   Treatment focused on  Cognition;Patient/family/caregiver education    Skilled Treatment  Patient arrived with binder  from rehab. Located calendar with appointments with assistance from husband. Mod-max a for organization to place in area for easy access. She reports she was journaling at the hospital but has not continued this at home. SLP assisted pt with beginning new journal entry for today; also created section at the bottom of the page for pt to make a to-do list.  Husband eager to assist pt and SLP educated re: giving patient extra time and first allowing her attempt to generate items for her journal and to-do list. Provided handout with instructions to continue journal/to-do list daily. Husband stated he wants to help pt get back to work, plans to help her work on driving later this month. SLP advised pt should have MD clearance before driving and that SLP does not recommend this at this time due to deficits in attention and executive function. [employer] executive function with grocery math/planning task; pt required mod cues to ID errors due to attention/attention to detail. Pt to complete for homework.      Assessment / Recommendations / Plan   Plan  Continue with current plan of care      Progression Toward Goals   Progression toward goals  Progressing toward goals  SLP Short Term Goals - 01/28/20 1148      SLP SHORT TERM GOAL #1   Title  Pt will use to -do list to complete 3 tasks/chores a day over 2 sessions with rare min A from family    Baseline  Pt is not doing chores/tasks and not using reminders    Time  4    Period  Weeks    Status  On-going      SLP SHORT TERM GOAL #2   Title  Pt will verbalize emergent awareness of 3 cogntve impairments with rare min A over 3 sessions    Baseline  intellectual awareness of 1 cognitive impairment    Time  4    Period  Weeks    Status  On-going      SLP SHORT TERM GOAL #3   Title  Pt will meal plan for 1 week including grocery list with occasional min A from family    Baseline  Pt is not currently doing this, it was her job PTA    Time  4     Period  Weeks    Status  On-going       SLP Long Term Goals - 01/28/20 1148      SLP LONG TERM GOAL #1   Title  Pt will use external aids to organize and manage medications accurately over 2 sessions    Baseline  daughter is managing her meds    Time  8    Period  Weeks    Status  On-going      SLP LONG TERM GOAL #2   Title  Pt will utilize compensations for attention, memory, safety to complete light meal prep with rare min A from family over 2 sessions    Baseline  This was pt's job PTA, currently not doing    Time  8    Period  Weeks    Status  On-going      SLP LONG TERM GOAL #3   Title  Pt will be intelligible and speak at 70-74dB in noisy environment with rare min A    Baseline  sub 70dB, reduced intelligibility reported by spouse    Time  8    Period  Weeks    Status  On-going       Plan - 01/28/20 1151    Clinical Impression Statement  Mistina Coatney was hospitalized 12/23/19 to 01/16/20. Prior to hospitalization, she was independent with ADL's/IADL's. She helped prep for meals, cared for her teen children, and did laundry.  She presents with encephalopathy (E51.2) resulting in moderate cognitive communication impairments (R41.841) and mild dysarthria (R47.1). Sposue reports she has had low volume since extubation and has reduced intellgiblity at home.Gae Bon verbalizes only 1 cogntive impairment of memory. No awareness of attention, executive function, visuospatial impairments. She is keeping a journal to help her recall her day, however she is not keeping a list of tasks she needs to complete. Family is doing all tasks for her. Prior to this, Karinna was responsible for keeping a grocery list and meal planning. Her husband Celesta Gentile is managing her medical appointments and informatoin. Vora's adult daughter is managing her medications.I recommend skilled ST to maximize cognitive communication skills for safety, to reduce her family's burden and improve independence.    Speech Therapy  Frequency  2x / week    Duration  --   8 weeks or 17 visits   Treatment/Interventions  Language facilitation;Environmental controls;Cueing hierarchy;SLP instruction and  feedback;Compensatory strategies;Functional tasks;Cognitive reorganization;Compensatory techniques;Patient/family education;Multimodal communcation approach;Internal/external aids    Potential to Achieve Goals  Good    Potential Considerations  Ability to learn/carryover information;Severity of impairments       Patient will benefit from skilled therapeutic intervention in order to improve the following deficits and impairments:   Cognitive communication deficit    Problem List Patient Active Problem List   Diagnosis Date Noted  . Reactive depression   . Wernicke's encephalopathy   . Transaminitis   . Steroid-induced hyperglycemia   . Leucocytosis   . Orthostasis   . Macrocytic anemia   . AMS (altered mental status)   . Sleep disturbance   . Pressure injury of skin 01/05/2020  . Alcoholic hepatitis with ascites   . Acute respiratory failure ([facility])   . Palliative care encounter   . Elevated bilirubin   . Ascites   . Advanced care planning/counseling discussion   . Goals of care, counseling/discussion   . Palliative care by specialist   . Hypoalbuminemia   . Hepatic encephalopathy ([facility]) 12/24/2019  . Alcoholic hepatitis without ascites   . Hypokalemia due to inadequate potassium intake 04/02/2019  . Alcohol withdrawal ([facility]) 10/24/2018  . Alcohol dependence with withdrawal, unspecified ([facility]) 10/24/2018  . Elevated LFTs 10/24/2018  . Thrombocytopenia ([facility]) 10/24/2018  . Alcohol-induced acute pancreatitis   . Acute blood loss anemia   . Alcoholic ketoacidosis 74/14/2395  . Alcohol abuse with alcohol-induced mood disorder ([facility]) 09/14/2017  . Elevated liver enzymes 04/29/2017   Deneise Lever, [place], [place] 01/28/2020, 11:51 AM  [facility] [address] [place], [place], [address] Phone: [phone]   Fax:  [phone]   Name: KAROLE OO MRN: [id number] Date of Birth: [birth year]

## 2020-01-28 NOTE — Patient Instructions (Signed)
Keep using your notebook to journal for each day. At the bottom of the page, make a to-do list. Plan ahead, write down any chores or errands you need to do.   Some other ideas for the journal part: write down details you want to remember (like who you talked to on the phone, what you talked about)  Memory Compensation Strategies  1. Use "WARM" strategy. W= write it down A=  associate it R=  repeat it M=  make a mental picture  2. You can keep a Social worker. Use a 3-ring notebook with sections for the following:  calendar, important names and phone numbers, medications, doctors' names/phone numbers, "to do list"/reminders, and a section to journal what you did each day  3. Use a calendar to write appointments down.  4. Write yourself a schedule for the day.  This can be placed on the calendar or in a separate section of the Memory Notebook.  Keeping a regular schedule can help memory.  5. Use medication organizer with sections for each day or morning/evening pills  You may need help loading it  6. Keep a basket, or pegboard by the door.   Place items that you need to take out with you in the basket or on the pegboard.  You may also want to include a message board for reminders.  7. Use sticky notes. Place sticky notes with reminders in a place where the task is performed.  For example:  "turn off the stove" placed by the stove, "lock the door" placed on the door at eye level, "take your medications" on the bathroom mirror or by the place where you normally take your medications  8. Use alarms/timers.  Use while cooking to remind yourself to check on food or as a reminder to take your medicine, or as a reminder to make a call, or as a reminder to perform another task, etc.  9. Use your phone to record voice memos or important information and notes for yourself.

## 2020-02-01 ENCOUNTER — Encounter: Payer: Self-pay | Admitting: Physical Medicine and Rehabilitation

## 2020-02-01 ENCOUNTER — Encounter
Payer: Medicaid Other | Attending: Physical Medicine and Rehabilitation | Admitting: Physical Medicine and Rehabilitation

## 2020-02-01 ENCOUNTER — Ambulatory Visit: Payer: Medicaid Other | Admitting: Speech Pathology

## 2020-02-01 ENCOUNTER — Other Ambulatory Visit: Payer: Self-pay

## 2020-02-01 ENCOUNTER — Encounter: Payer: Self-pay | Admitting: Speech Pathology

## 2020-02-01 ENCOUNTER — Ambulatory Visit: Payer: Medicaid Other

## 2020-02-01 VITALS — BP 115/74 | HR 83 | Temp 97.3°F | Ht 64.0 in | Wt 134.6 lb

## 2020-02-01 DIAGNOSIS — K7011 Alcoholic hepatitis with ascites: Secondary | ICD-10-CM | POA: Diagnosis not present

## 2020-02-01 DIAGNOSIS — I951 Orthostatic hypotension: Secondary | ICD-10-CM

## 2020-02-01 DIAGNOSIS — K7290 Hepatic failure, unspecified without coma: Secondary | ICD-10-CM | POA: Diagnosis present

## 2020-02-01 DIAGNOSIS — M6281 Muscle weakness (generalized): Secondary | ICD-10-CM | POA: Diagnosis not present

## 2020-02-01 DIAGNOSIS — E512 Wernicke's encephalopathy: Secondary | ICD-10-CM | POA: Insufficient documentation

## 2020-02-01 DIAGNOSIS — F1014 Alcohol abuse with alcohol-induced mood disorder: Secondary | ICD-10-CM | POA: Diagnosis present

## 2020-02-01 DIAGNOSIS — K7682 Hepatic encephalopathy: Secondary | ICD-10-CM

## 2020-02-01 DIAGNOSIS — R471 Dysarthria and anarthria: Secondary | ICD-10-CM

## 2020-02-01 DIAGNOSIS — R41841 Cognitive communication deficit: Secondary | ICD-10-CM

## 2020-02-01 DIAGNOSIS — R2681 Unsteadiness on feet: Secondary | ICD-10-CM

## 2020-02-01 MED ORDER — LACTULOSE 10 GM/15ML PO SOLN: 30.0000 g | Freq: Three times a day (TID) | ORAL | 3 refills | Status: DC

## 2020-02-01 MED ORDER — MAGNESIUM GLUCONATE 30 MG PO TABS: 30.0000 mg | ORAL_TABLET | Freq: Two times a day (BID) | ORAL | 3 refills | Status: DC

## 2020-02-01 NOTE — Patient Instructions (Signed)
Access Code: [email or web address] URL: [email or web address] Date: 02/01/2020 Prepared by: Baldomero Lamy  Exercises Tandem Stance - 1 x daily - 7 x weekly - 1 sets - 3 reps - 30 hold Romberg Stance Eyes Closed on Foam Pad - 1 x daily - 7 x weekly - 1 sets - 3 reps - 30 hold Romberg Stance on Foam Pad with Head Rotation - 1 x daily - 7 x weekly - 1 sets - 3 reps - 30 hold Tandem Walking with Counter Support - 1 x daily - 7 x weekly - 3 sets - 10 reps Walking March with Countertop Support - 1 x daily - 7 x weekly - 3 sets - 10 reps

## 2020-02-01 NOTE — Therapy (Signed)
[facility] [address] [place], [place], [address] Phone: [phone]   Fax:  [phone]  Physical Therapy Treatment  Patient Details  Name: Joann [name] MRN: [id number] Date of Birth: 1980/11/16 Referring Provider (PT): Dr. Leeroy Cha   Encounter Date: 02/01/2020  PT End of Session - 02/01/20 1148    Visit Number  3    Number of Visits  19    Date for PT Re-Evaluation  03/14/20    Authorization Type  Medicaid    PT Start Time  1017    PT Stop Time  1100    PT Time Calculation (min)  43 min    Equipment Utilized During Treatment  Gait belt    Activity Tolerance  Patient tolerated treatment well    Behavior During Therapy  [facility] for tasks assessed/performed       Past Medical History:  Diagnosis Date  . Alcohol abuse   . Anxiety   . Hypertension   . Liver disease     Past Surgical History:  Procedure Laterality Date  . ECTOPIC PREGNANCY SURGERY    . ESOPHAGOGASTRODUODENOSCOPY (EGD) WITH PROPOFOL N/A 12/27/2019   Procedure: ESOPHAGOGASTRODUODENOSCOPY (EGD) WITH PROPOFOL;  Surgeon: Carol Ada, MD;  Location: [facility];  Service: Endoscopy;  Laterality: N/A;    There were no vitals filed for this visit.  Subjective Assessment - 02/01/20 1019    Subjective  Patient reports that the L foot has improved and reports no pain. Patient reports that she has been doing more for her self, and has been cooking. Patient reports that she has been staying busy. No falls in the house.    Patient is accompained by:  Family member   Husband   Pertinent History  hepatic encephalopathy, Wernicke's encephalopathy    Limitations  Standing;Walking;House hold activities;Lifting    How long can you sit comfortably?  no issues    How long can you stand comfortably?  5 min    How long can you walk comfortably?  15-20 min with support of cart    Patient Stated Goals  "get stronger"    Currently in Pain?  No/denies    Pain  Onset  Today    Pain Onset  In the past 7 days                        [facility] Adult PT Treatment/Exercise - 02/01/20 1154      Transfers   Transfers  Sit to Stand;Stand to Sit    Sit to Stand  7: Independent    Stand to Sit  7: Independent      Ambulation/Gait   Ambulation/Gait  Yes    Ambulation/Gait Assistance  5: Supervision    Ambulation/Gait Assistance Details  completed gait training on unlevel outdoor surfaces. Overall patietn requiring supv today. Educated patient and husband to have someone by for safety if ambulating outdoors.     Ambulation Distance (Feet)  350 Feet    Assistive device  None    Ambulation Surface  Unlevel;Outdoor;Paved;Grass      High Level Balance   High Level Balance Activities  Tandem walking;Marching forwards    High Level Balance Comments  Completed tandem walking along countertop x 3 laps, and marching forwards x 3 laps. Only utilizing UE support           Balance Exercises - 02/01/20 1149      Balance Exercises: Standing   Standing Eyes  Opened  Narrow base of support (BOS);Solid surface;Head turns;3 reps;30 secs;Limitations    Standing Eyes Closed  Solid surface;Narrow base of support (BOS);4 reps;30 secs    Tandem Stance  Eyes open;Intermittent upper extremity support;3 reps;30 secs;15 secs;Limitations    Tandem Stance Time  focus on holding 10-15 secs, and progressing as tolerated. Intermittent CGA from PT.     Rockerboard  Anterior/posterior;30 seconds;EO;EC;Limitations;Intermittent UE support    Rockerboard Limitations  focused on holding steady x 3 w/ EO, x 3 w/ EC. Increased sway with EC    Other Standing Exercises  Alternating toe taps to cone standing on foam in // bars, completed 2 x 10 reps alternating w/o UE support.         PT Education - 02/01/20 1147    Education Details  Educated on Initial HEP    Person(s) Educated  Patient;Spouse    Methods  Explanation;Demonstration;Handout    Comprehension  Verbalized  understanding;Returned demonstration;Verbal cues required       PT Short Term Goals - 01/18/20 1122      PT SHORT TERM GOAL #1   Title  Patient will be able to stand for 15 min to be able to cook a small meal for her self.    Baseline  5 min    Time  3    Period  Weeks    Status  New    Target Date  02/08/20      PT SHORT TERM GOAL #2   Title  Pt will be able to ambulate for 30 min of continous walk to improve walking endurance to improve community ambulation    Baseline  15-20 min    Time  3    Period  Weeks    Status  New    Target Date  02/08/20      PT SHORT TERM GOAL #3   Title  Pt will be able to carry 5 lb for 115' to assist with getting grocery bags from car to home    Baseline  not attempted    Time  3    Period  Weeks    Status  New    Target Date  02/08/20      PT SHORT TERM GOAL #4   Title  Pt will be able to perform 5 x unilateral heel raises with bil HHA to improve strength of bil plantarflexors    Baseline  unable    Time  3    Period  Weeks    Status  New    Target Date  02/08/20      PT SHORT TERM GOAL #5   Title  Pt will be able to perform 1x sit to stand without use of HHA to improve functional strength    Baseline  unable to without HHA support    Time  3    Period  Weeks    Status  New      Additional Short Term Goals   Additional Short Term Goals  Yes      PT SHORT TERM GOAL #6   Title  Pt will be able to get in and out of her husband's truck with SBA to improve functional transfers    Baseline  min to mod A provided by husband    Time  3    Period  Weeks    Status  New    Target Date  02/08/20        PT Long Term Goals -  01/18/20 1127      PT LONG TERM GOAL #1   Title  Pt will demo 56/56 on BBS to improve overall balance and reduce fall risk    Baseline  51/56    Time  8    Period  Weeks    Status  New    Target Date  03/14/20      PT LONG TERM GOAL #2   Title  Pt will be able to perform 5x sit to stand without use of HHA in  under 20 seconds to improve overall functional strength    Baseline  25 sec with bil HHA assist    Time  8    Period  Weeks    Status  New    Target Date  03/14/20      PT LONG TERM GOAL #3   Title  Pt will be able to ambulate 1 mile in under 25 min to improve functional ambulation and to return to PLOF.    Baseline  not attempted to walk a mile yet    Time  8    Period  Weeks    Status  New    Target Date  03/14/20      PT LONG TERM GOAL #4   Title  Pt will demo 5/5 strength in Bil LE to improve overall strength and transfers.    Baseline  3+-4/5 grossly    Time  8    Period  Weeks    Status  New    Target Date  03/14/20            Plan - 02/01/20 1159    Clinical Impression Statement  Today's skilled PT session included further balance assessment and initiation of balance HEP. Patient demos difficulty at this time with narrow BOS and eyes closed requiring CGA from PT. With gait training outdoors, patient overall looked steady, but PT educating patient and husband to continue to have someone in close proximity with outdoors for safety at this time. Pt will continue to benefit from skilled PT services to address balance deficits and progress toward goals.    Personal Factors and Comorbidities  Time since onset of injury/illness/exacerbation;Transportation    Examination-Activity Limitations  Caring for Others;Carry;Lift;Locomotion Level;Squat;Stairs;Stand;Transfers    Examination-Participation Restrictions  Cleaning;Meal Prep;Shop;Yard Work;Laundry;Driving;Community Activity    Stability/Clinical Decision Making  Evolving/Moderate complexity    Rehab Potential  Good    PT Frequency  2x / week    PT Duration  8 weeks    PT Treatment/Interventions  ADLs/Self Care Home Management;Gait training;Stair training;Functional mobility training;Therapeutic activities;Therapeutic exercise;Balance training;Neuromuscular re-education;Manual techniques;Patient/family education    PT Next  Visit Plan  Review and progress HEP, add balance exercises       Patient will benefit from skilled therapeutic intervention in order to improve the following deficits and impairments:  Abnormal gait, Decreased balance, Decreased cognition, Decreased endurance, Decreased strength, Decreased mobility, Difficulty walking  Visit Diagnosis: Muscle weakness (generalized)  Unsteadiness on feet  Wernicke's encephalopathy     Problem List Patient Active Problem List   Diagnosis Date Noted  . Reactive depression   . Wernicke's encephalopathy   . Transaminitis   . Steroid-induced hyperglycemia   . Leucocytosis   . Orthostasis   . Macrocytic anemia   . AMS (altered mental status)   . Sleep disturbance   . Pressure injury of skin 01/05/2020  . Alcoholic hepatitis with ascites   . Acute respiratory failure ([place])   . Palliative care  encounter   . Elevated bilirubin   . Ascites   . Advanced care planning/counseling discussion   . Goals of care, counseling/discussion   . Palliative care by specialist   . Hypoalbuminemia   . Hepatic encephalopathy ([place]) 12/24/2019  . Alcoholic hepatitis without ascites   . Hypokalemia due to inadequate potassium intake 04/02/2019  . Alcohol withdrawal ([place]) 10/24/2018  . Alcohol dependence with withdrawal, unspecified ([place]) 10/24/2018  . Elevated LFTs 10/24/2018  . Thrombocytopenia ([place]) 10/24/2018  . Alcohol-induced acute pancreatitis   . Acute blood loss anemia   . Alcoholic ketoacidosis 49/96/9249  . Alcohol abuse with alcohol-induced mood disorder ([place]) 09/14/2017  . Elevated liver enzymes 04/29/2017    Jones Bales, PT, DPT 02/01/2020, 12:03 PM  [facility] [address] [place], [place], [address] Phone: [phone]   Fax:  [phone]  Name: Joann [name] MRN: [id number] Date of Birth: 04-11-1981

## 2020-02-01 NOTE — Therapy (Signed)
[facility] [address] [place], [place], [address] Phone: [phone]   Fax:  [phone]  Speech Language Pathology Treatment  Patient Details  Name: Joann [name] MRN: [id number] Date of Birth: [birth year] Referring Provider (SLP): Dr. Alger Simons   Encounter Date: 02/01/2020  End of Session - 02/01/20 1201    Visit Number  3    Number of Visits  17    Date for SLP Re-Evaluation  03/14/20    Authorization Time Period  medicaid approved 10 visits 5/20-03/16/20    Authorization - Visit Number  2    Authorization - Number of Visits  10    SLP Start Time  1093    SLP Stop Time   1145    SLP Time Calculation (min)  43 min    Activity Tolerance  Patient tolerated treatment well       Past Medical History:  Diagnosis Date  . Alcohol abuse   . Anxiety   . Hypertension   . Liver disease     Past Surgical History:  Procedure Laterality Date  . ECTOPIC PREGNANCY SURGERY    . ESOPHAGOGASTRODUODENOSCOPY (EGD) WITH PROPOFOL N/A 12/27/2019   Procedure: ESOPHAGOGASTRODUODENOSCOPY (EGD) WITH PROPOFOL;  Surgeon: Carol Ada, MD;  Location: [facility];  Service: Endoscopy;  Laterality: N/A;    There were no vitals filed for this visit.  Subjective Assessment - 02/01/20 1116    Subjective  Pt enters room and takes out daily journal    Patient is accompained by:  Family member   spouse, Joann [name]   Currently in Pain?  No/denies            ADULT SLP TREATMENT - 02/01/20 1116      General Information   Behavior/Cognition  Alert;Cooperative      Treatment Provided   Treatment provided  Cognitive-Linquistic      Cognitive-Linquistic Treatment   Treatment focused on  Cognition;Patient/family/caregiver education    Skilled Treatment  Pt cooked full meal successfully this weekend. She is using compensatory strategies for med management successfully per pt and spouse. Organizing mildly complex data and completing  functoinal math similar to med pass with occasional min A to re-attend or locate last entered value after conversation distraction. Shamara generated 3 areas that she could get distracted at work and ways to compensate for her memory when distracted with occasional min A.       Assessment / Recommendations / Plan   Plan  Continue with current plan of care       SLP Education - 02/01/20 1152    Education Details  memory strategies at work    [employer]) Educated  Patient    Methods  Explanation;Verbal cues    Comprehension  Verbal cues required;Need further instruction       SLP Short Term Goals - 02/01/20 1200      SLP SHORT TERM GOAL #1   Title  Pt will use to -do list to complete 3 tasks/chores a day over 2 sessions with rare min A from family    Baseline  Pt is not doing chores/tasks and not using reminders    Time  3    Period  Weeks    Status  On-going      SLP SHORT TERM GOAL #2   Title  Pt will verbalize emergent awareness of 3 cogntve impairments with rare min A over 3 sessions    Baseline  intellectual awareness of 1 cognitive impairment  Time  3    Period  Weeks    Status  On-going      SLP SHORT TERM GOAL #3   Title  Pt will meal plan for 1 week including grocery list with occasional min A from family    Baseline  Pt is not currently doing this, it was her job PTA    Time  3    Period  Weeks    Status  On-going       SLP Long Term Goals - 02/01/20 1201      SLP LONG TERM GOAL #1   Title  Pt will use external aids to organize and manage medications accurately over 2 sessions    Baseline  daughter is managing her meds    Time  7    Period  Weeks    Status  On-going      SLP LONG TERM GOAL #2   Title  Pt will utilize compensations for attention, memory, safety to complete light meal prep with rare min A from family over 2 sessions    Baseline  This was pt's job PTA, currently not doing    Time  7    Period  Weeks    Status  On-going      SLP LONG TERM GOAL  #3   Title  Pt will be intelligible and speak at 70-74dB in noisy environment with rare min A    Baseline  sub 70dB, reduced intelligibility reported by spouse    Time  7    Period  Weeks    Status  On-going       Plan - 02/01/20 1152    Clinical Impression Statement  Ongoing training in compensatory strategies for memory and attention impairments.She is using strategies to successfully manage her medications independently at home and has cooked some family meals. Ongoing use of journal to recall daily events. Continue skilled ST to maximize cognitive linguistic skills for eventual return to work. Pt/spouse goal for return to work is Late July or August.    Speech Therapy Frequency  2x / week    Duration  --   8 weeks or 17 visits   Treatment/Interventions  Language facilitation;Environmental controls;Cueing hierarchy;SLP instruction and feedback;Compensatory strategies;Functional tasks;Cognitive reorganization;Compensatory techniques;Patient/family education;Multimodal communcation approach;Internal/external aids    Potential to Achieve Goals  Good    Potential Considerations  Ability to learn/carryover information       Patient will benefit from skilled therapeutic intervention in order to improve the following deficits and impairments:   Cognitive communication deficit  Dysarthria and anarthria    Problem List Patient Active Problem List   Diagnosis Date Noted  . Reactive depression   . Wernicke's encephalopathy   . Transaminitis   . Steroid-induced hyperglycemia   . Leucocytosis   . Orthostasis   . Macrocytic anemia   . AMS (altered mental status)   . Sleep disturbance   . Pressure injury of skin 01/05/2020  . Alcoholic hepatitis with ascites   . Acute respiratory failure ([place])   . Palliative care encounter   . Elevated bilirubin   . Ascites   . Advanced care planning/counseling discussion   . Goals of care, counseling/discussion   . Palliative care by specialist    . Hypoalbuminemia   . Hepatic encephalopathy ([place]) 12/24/2019  . Alcoholic hepatitis without ascites   . Hypokalemia due to inadequate potassium intake 04/02/2019  . Alcohol withdrawal ([place]) 10/24/2018  . Alcohol dependence with withdrawal, unspecified ([place]) 10/24/2018  .  Elevated LFTs 10/24/2018  . Thrombocytopenia ([place]) 10/24/2018  . Alcohol-induced acute pancreatitis   . Acute blood loss anemia   . Alcoholic ketoacidosis 70/35/0093  . Alcohol abuse with alcohol-induced mood disorder ([place]) 09/14/2017  . Elevated liver enzymes 04/29/2017    Khaliq Turay, Annye Rusk MS, CCC-SLP 02/01/2020, 12:02 PM  [facility] [address] [place], [place], [address] Phone: [phone]   Fax:  [phone]   Name: SANAIYAH KIRCHHOFF MRN: [id number] Date of Birth: [birth year]

## 2020-02-01 NOTE — Progress Notes (Signed)
Subjective:    Patient ID: Joann [name], female    DOB: 11-18-1980, 39 y.o.   MRN: [id number]  HPI  Joann [name] is a 39 year old woman who presents for follow-up of Wernicke's encephalopathy, which resulted as a complication of alcohol abuse.  She has been receiving outpatient therapy. She continues to experience balance and cognitive deficits and feels she is benefiting from therapy.  She has been receiving support at home from her husband and two children, one of which brought her to her appointment today.   She has sought out independent counseling for her alcoholism as she finds this to be more helpful than group therapy. Defers follow-up with neuropsych at this time.   All medications reviewed and she is taking them as prescribed. The only refill she needs is of her lactulose, which she continues to take three times per day. She has been having a lot of diarrhea from this.  She shares that she had magnesium deficiency in the past and would like a magnesium supplement. She had recent labs with PCP (not in out system) and states that her K+ was still low. Defers repeat labs today.   Denies pain.  Pain Inventory Average Pain 0 Pain Right Now 0 My pain is no pain  In the last 24 hours, has pain interfered with the following? General activity 6 Relation with others 6 Enjoyment of life 10 What TIME of day is your pain at its worst? evening Sleep (in general) Fair  Pain is worse with: no pain Pain improves with: no pain Relief from Meds: no pain  Mobility walk without assistance how many minutes can you walk? 30 ability to climb steps?  no do you drive?  no  Function disabled: date disabled 12/22/19  Neuro/Psych confusion  Prior Studies Any changes since last visit?  no  Physicians involved in your care Any changes since last visit?  no   Family History  Problem Relation Age of Onset  . Healthy Mother   . Alcohol abuse Mother        quit in her 61s  . Alcohol  abuse Father   . Diabetes Father   . Healthy Brother   . Hypertension Maternal Grandmother   . Hypertension Maternal Grandfather   . Healthy Brother   . Healthy Brother    Social History   Socioeconomic History  . Marital status: Married    Spouse name: Not on file  . Number of children: Not on file  . Years of education: Not on file  . Highest education level: Not on file  Occupational History  . Occupation: nurse  Tobacco Use  . Smoking status: Never Smoker  . Smokeless tobacco: Never Used  Substance and Sexual Activity  . Alcohol use: Yes    Comment: "I just do it"  . Drug use: Never  . Sexual activity: Yes  Other Topics Concern  . Not on file  Social History Narrative  . Not on file   Social Determinants of Health   Financial Resource Strain:   . Difficulty of Paying Living Expenses:   Food Insecurity:   . Worried About Charity fundraiser in the Last Year:   . Arboriculturist in the Last Year:   Transportation Needs:   . Film/video editor (Medical):   Marland Kitchen Lack of Transportation (Non-Medical):   Physical Activity:   . Days of Exercise per Week:   . Minutes of Exercise per Session:   Stress:   .  Feeling of Stress :   Social Connections:   . Frequency of Communication with Friends and Family:   . Frequency of Social Gatherings with Friends and Family:   . Attends Religious Services:   . Active Member of Clubs or Organizations:   . Attends Archivist Meetings:   Marland Kitchen Marital Status:    Past Surgical History:  Procedure Laterality Date  . ECTOPIC PREGNANCY SURGERY    . ESOPHAGOGASTRODUODENOSCOPY (EGD) WITH PROPOFOL N/A 12/27/2019   Procedure: ESOPHAGOGASTRODUODENOSCOPY (EGD) WITH PROPOFOL;  Surgeon: Carol Ada, MD;  Location: [facility];  Service: Endoscopy;  Laterality: N/A;   Past Medical History:  Diagnosis Date  . Alcohol abuse   . Anxiety   . Hypertension   . Liver disease    BP 115/74   Pulse 83   Temp (!) 97.3 F (36.3 C)    Ht 5' 4"  (1.626 m)   Wt 134 lb 9.6 oz (61.1 kg)   SpO2 98%   BMI 23.10 kg/m   Opioid Risk Score:   Fall Risk Score:  `1  Depression screen PHQ 2/9  Depression screen [facility] 2/9 02/01/2020 09/29/2018  Decreased Interest 0 0  Down, Depressed, Hopeless 0 0  PHQ - 2 Score 0 0  Altered sleeping 0 0  Tired, decreased energy 0 0  Change in appetite 0 0  Feeling bad or failure about yourself  0 0  Trouble concentrating 1 0  Moving slowly or fidgety/restless 0 0  Suicidal thoughts 0 0  PHQ-9 Score 1 0  Difficult doing work/chores Not difficult at all -    Review of Systems  Constitutional: Negative.   HENT: Negative.   Eyes: Negative.   Respiratory: Negative.   Cardiovascular: Negative.   Gastrointestinal: Negative.   Endocrine: Negative.   Genitourinary: Negative.   Musculoskeletal: Negative.   Skin: Negative.   Allergic/Immunologic: Negative.   Neurological: Negative.   Hematological: Negative.   Psychiatric/Behavioral: Positive for confusion.  All other systems reviewed and are negative.      Objective:   Physical Exam Constitutional: No distress . Vital signs reviewed. HEENT: EOMI, oral membranes moist, icterus better Neck: supple Cardiovascular: RRR without murmur. No JVD    Respiratory/Chest: CTA Bilaterally without wheezes or rales. Normal effort    GI/Abdomen: BS +, non-tender, non-distended Ext: no clubbing, cyanosis, or edema Psych: pleasant and cooperative Skin: Warm and dry.  Intact. Psych: improved insight, awareness, and concentration. Musc: No edema in extremities.  No tenderness in extremities. Neurological: Alert Motor: Grossly 5/5 in all 4. No limb ataxia Ambulating with steady gait.     Assessment & Plan:  1.  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  Continue lactulose as directed as well as prednisolone x28 days total per GI services as well as Xifaxan for chronic liver  disease             -Continue outpatient therapies focused on muscle weakness and cognitive deficits.  -Continue regular PCP follow-up and substance abuse counseling with individual therapy. Patient defers psychiatric follow-up at this time.              -Continue 24/7 supervision given cognitive and physical deficits.  -Reviewed recent ED note: 5/17 for ankle swelling. Appears to have resolved.   2. Pain Management: No pain  3. Mood: Continue Trazodone 25 mg nightly for insomnia.              4. Hypokalemia: Patient  states she had recent labs and defers labs today. Unable to view labs in system. Continue K+ supplement and repeat labs at next visit.  5. Hypomagnesemia: patient reports history. Start magensium gluconate. Advised this will also contribute to diarrhea. Will also help with insomnia. Check magnesium level next visit.   6.  Coagulopathy/thrombocytopenia/chronic macrocytic anemia/leukocytosis.  Leukocytosis felt to be related to prednisolone.  Repeat labs next visit- patient defers today.                7.  Orthostasis.  ProAmatine 3 times daily.             Controlled in office.   All questions answered. Please return to clinic in 1 month to assess response to above interventions.

## 2020-02-02 ENCOUNTER — Encounter: Payer: Self-pay | Admitting: Physical Medicine and Rehabilitation

## 2020-02-03 ENCOUNTER — Other Ambulatory Visit: Payer: Self-pay

## 2020-02-03 ENCOUNTER — Encounter: Payer: Medicaid Other | Admitting: Occupational Therapy

## 2020-02-03 ENCOUNTER — Ambulatory Visit: Payer: Medicaid Other | Admitting: Speech Pathology

## 2020-02-03 DIAGNOSIS — R41841 Cognitive communication deficit: Secondary | ICD-10-CM

## 2020-02-03 DIAGNOSIS — M6281 Muscle weakness (generalized): Secondary | ICD-10-CM | POA: Diagnosis not present

## 2020-02-03 DIAGNOSIS — R471 Dysarthria and anarthria: Secondary | ICD-10-CM

## 2020-02-03 NOTE — Patient Instructions (Signed)
    At work, use a note pad to [employer] notes for things you need to remember that are not in [employer] or the electronic chart, such as needing to check back with a resident or physician  Practice typing at home  Have a reminder to lock your med cart if you get distracted

## 2020-02-03 NOTE — Therapy (Signed)
[facility] [address] [place], [place], [address] Phone: [phone]   Fax:  [phone]  Speech Language Pathology Treatment  Patient Details  Name: Joann [name] MRN: [id number] Date of Birth: 06-30-1981 Referring Provider (SLP): Dr. Alger Simons   Encounter Date: 02/03/2020  End of Session - 02/03/20 1248    Visit Number  4    Number of Visits  17    Date for SLP Re-Evaluation  03/14/20    Authorization Type  medicaid - Pt receiving PT and OT evals - PT requesting 12, I will request 10    Authorization Time Period  medicaid approved 10 visits 5/20-03/16/20    Authorization - Visit Number  3    Authorization - Number of Visits  10    SLP Start Time  1100    SLP Stop Time   1142    SLP Time Calculation (min)  42 min    Activity Tolerance  Patient tolerated treatment well       Past Medical History:  Diagnosis Date  . Alcohol abuse   . Anxiety   . Hypertension   . Liver disease     Past Surgical History:  Procedure Laterality Date  . ECTOPIC PREGNANCY SURGERY    . ESOPHAGOGASTRODUODENOSCOPY (EGD) WITH PROPOFOL N/A 12/27/2019   Procedure: ESOPHAGOGASTRODUODENOSCOPY (EGD) WITH PROPOFOL;  Surgeon: Carol Ada, MD;  Location: [facility];  Service: Endoscopy;  Laterality: N/A;    There were no vitals filed for this visit.  Subjective Assessment - 02/03/20 1109    Subjective  "I'm doing things around the house and trying not to watch to much TV"    Currently in Pain?  No/denies            ADULT SLP TREATMENT - 02/03/20 1110      General Information   Behavior/Cognition  Alert;Cooperative      Treatment Provided   Treatment provided  Cognitive-Linquistic      Cognitive-Linquistic Treatment   Treatment focused on  Cognition;Patient/family/caregiver education    Skilled Treatment  Ongoing collaboration for strategies for memory at work. Scenario of remembering to check back on a resident she  gave a laxative to -we generated strategy of using a mininote book to write down information she has to rembmer that is not in EMR. We discussed her not picking up extra shift on unfamiliar units initially.  Attention to details reading pool and bank schedule and solving simple time problems requrired frequent min to mod verbal cues, written cues for oragnization of the problem. Pt incorrectly read schedules 4x and reported difficulty with working memory recalling the [place] after reading the information in the schedule.       Assessment / Recommendations / Plan   Plan  Continue with current plan of care      Progression Toward Goals   Progression toward goals  Progressing toward goals       SLP Education - 02/03/20 1246    Education Details  compensations for memory at work    [employer]) Educated  Patient;Child(ren)    Comprehension  Verbal cues required;Need further instruction       SLP Short Term Goals - 02/03/20 1247      SLP SHORT TERM GOAL #1   Title  Pt will use to -do list to complete 3 tasks/chores a day over 2 sessions with rare min A from family    Baseline  Pt is not doing chores/tasks and not using reminders  Time  3    Period  Weeks    Status  On-going      SLP SHORT TERM GOAL #2   Title  Pt will verbalize emergent awareness of 3 cogntve impairments with rare min A over 3 sessions    Baseline  intellectual awareness of 1 cognitive impairment    Time  3    Period  Weeks    Status  On-going      SLP SHORT TERM GOAL #3   Title  Pt will meal plan for 1 week including grocery list with occasional min A from family    Baseline  Pt is not currently doing this, it was her job PTA    Time  3    Period  Weeks    Status  On-going       SLP Long Term Goals - 02/03/20 1248      SLP LONG TERM GOAL #1   Title  Pt will use external aids to organize and manage medications accurately over 2 sessions    Baseline  daughter is managing her meds    Time  7    Period  Weeks     Status  On-going      SLP LONG TERM GOAL #2   Title  Pt will utilize compensations for attention, memory, safety to complete light meal prep with rare min A from family over 2 sessions    Baseline  This was pt's job PTA, currently not doing    Time  7    Period  Weeks    Status  On-going      SLP LONG TERM GOAL #3   Title  Pt will be intelligible and speak at 70-74dB in noisy environment with rare min A    Baseline  sub 70dB, reduced intelligibility reported by spouse    Time  7    Period  Weeks    Status  On-going       Plan - 02/03/20 1247    Clinical Impression Statement  Ongoing training in compensatory strategies for memory and attention impairments.She is using strategies to successfully manage her medications independently at home and has cooked some family meals. Ongoing use of journal to recall daily events. Continue skilled ST to maximize cognitive linguistic skills for eventual return to work. Pt/spouse goal for return to work is Late July or August.    Speech Therapy Frequency  2x / week    Duration  --   8 weeks or 17 visits   Treatment/Interventions  Language facilitation;Environmental controls;Cueing hierarchy;SLP instruction and feedback;Compensatory strategies;Functional tasks;Cognitive reorganization;Compensatory techniques;Patient/family education;Multimodal communcation approach;Internal/external aids    Potential to Achieve Goals  Good    Potential Considerations  Ability to learn/carryover information       Patient will benefit from skilled therapeutic intervention in order to improve the following deficits and impairments:   Cognitive communication deficit  Dysarthria and anarthria    Problem List Patient Active Problem List   Diagnosis Date Noted  . Reactive depression   . Wernicke's encephalopathy   . Transaminitis   . Steroid-induced hyperglycemia   . Leucocytosis   . Orthostasis   . Macrocytic anemia   . AMS (altered mental status)   .  Sleep disturbance   . Pressure injury of skin 01/05/2020  . Alcoholic hepatitis with ascites   . Acute respiratory failure ([facility])   . Palliative care encounter   . Elevated bilirubin   . Ascites   . Advanced  care planning/counseling discussion   . Goals of care, counseling/discussion   . Palliative care by specialist   . Hypoalbuminemia   . Hepatic encephalopathy ([facility]) 12/24/2019  . Alcoholic hepatitis without ascites   . Hypokalemia due to inadequate potassium intake 04/02/2019  . Alcohol withdrawal ([facility]) 10/24/2018  . Alcohol dependence with withdrawal, unspecified ([facility]) 10/24/2018  . Elevated LFTs 10/24/2018  . Thrombocytopenia ([facility]) 10/24/2018  . Alcohol-induced acute pancreatitis   . Acute blood loss anemia   . Alcoholic ketoacidosis 28/54/9656  . Alcohol abuse with alcohol-induced mood disorder ([facility]) 09/14/2017  . Elevated liver enzymes 04/29/2017    Victory Strollo, Annye Rusk MS, CCC-SLP 02/03/2020, 12:49 PM  [facility] [address] [place], [place], [address] Phone: [phone]   Fax:  [phone]   Name: JODENE POLYAK MRN: [id number] Date of Birth: [birth year]

## 2020-02-05 ENCOUNTER — Ambulatory Visit: Payer: Medicaid Other

## 2020-02-09 ENCOUNTER — Ambulatory Visit: Payer: Medicaid Other | Attending: Physician Assistant

## 2020-02-09 ENCOUNTER — Encounter: Payer: Medicaid Other | Admitting: Occupational Therapy

## 2020-02-09 ENCOUNTER — Other Ambulatory Visit: Payer: Self-pay

## 2020-02-09 DIAGNOSIS — K7290 Hepatic failure, unspecified without coma: Secondary | ICD-10-CM | POA: Diagnosis present

## 2020-02-09 DIAGNOSIS — R471 Dysarthria and anarthria: Secondary | ICD-10-CM | POA: Diagnosis present

## 2020-02-09 DIAGNOSIS — R41841 Cognitive communication deficit: Secondary | ICD-10-CM | POA: Insufficient documentation

## 2020-02-09 DIAGNOSIS — M6281 Muscle weakness (generalized): Secondary | ICD-10-CM | POA: Insufficient documentation

## 2020-02-09 DIAGNOSIS — E512 Wernicke's encephalopathy: Secondary | ICD-10-CM | POA: Diagnosis present

## 2020-02-09 DIAGNOSIS — R2681 Unsteadiness on feet: Secondary | ICD-10-CM | POA: Diagnosis present

## 2020-02-09 NOTE — Therapy (Addendum)
[facility] [address] [place], [place], [address] Phone: [phone]   Fax:  [phone]  Physical Therapy Treatment  Patient Details  Name: Joann [name] MRN: [id number] Date of Birth: May 05, 1981 Referring Provider (PT): Dr. Leeroy Cha   Encounter Date: 02/09/2020  PT End of Session - 02/09/20 1414    Visit Number  4    Number of Visits  19    Date for PT Re-Evaluation  03/14/20    Authorization Type  Medicaid    Authorization Time Period  01/26/20 to 02/15/20    Authorization - Visit Number  2    Authorization - Number of Visits  3    Progress Note Due on Visit  3    PT Start Time  1315    PT Stop Time  1400    PT Time Calculation (min)  45 min    Equipment Utilized During Treatment  Gait belt    Activity Tolerance  Patient tolerated treatment well    Behavior During Therapy  [facility] for tasks assessed/performed       Past Medical History:  Diagnosis Date  . Alcohol abuse   . Anxiety   . Hypertension   . Liver disease     Past Surgical History:  Procedure Laterality Date  . ECTOPIC PREGNANCY SURGERY    . ESOPHAGOGASTRODUODENOSCOPY (EGD) WITH PROPOFOL N/A 12/27/2019   Procedure: ESOPHAGOGASTRODUODENOSCOPY (EGD) WITH PROPOFOL;  Surgeon: Carol Ada, MD;  Location: [facility];  Service: Endoscopy;  Laterality: N/A;    There were no vitals filed for this visit.  Subjective Assessment - 02/09/20 1327    Subjective  Patient reports that the L foot has improved and reports no pain. Patient reports that she has been doing more for her self, and has been cooking. Patient reports that she has been staying busy. No falls in the house.    Patient is accompained by:  Family member   Husband   Pertinent History  hepatic encephalopathy, Wernicke's encephalopathy    Limitations  Standing;Walking;House hold activities;Lifting    How long can you sit comfortably?  no issues    How long can you stand comfortably?  5  min    How long can you walk comfortably?  15-20 min with support of cart    Patient Stated Goals  "get stronger"    Pain Onset  Today    Pain Onset  In the past 7 days         [facility] PT Assessment - 02/09/20 1412      Standardized Balance Assessment   Standardized Balance Assessment  Dynamic Gait Index;Five Times Sit to Stand    Five times sit to stand comments   13.32      Dynamic Gait Index   Level Surface  Normal    Change in Gait Speed  Normal    Gait with Horizontal Head Turns  Normal    Gait with Vertical Head Turns  Normal    Gait and Pivot Turn  Normal    Step Over Obstacle  Normal    Step Around Obstacles  Normal    Steps  Normal    Total Score  24     Treatment: 5 x sit to stand: 13.32 seconds Gait training: walking on 20 feet of pine needles, walked on steep grassy hill and walked down hilll on gradual grassy hill, practiced curb steps up and down.  Unilateral heel raises: 10x Walking with laundry basket in  hand: 85' Wlaking around cones and over 6" hurdles with laundry basket in hand: 2x 3 cones, 2 hurdles Walking up and down 4 steps with laundry basket in hand no rail: 4 steps  Sit to stand: 10lbs 5x SLS: 20 sec L LE, 5 sec R LE CTSIB: passed all 4 tests.                         PT Short Term Goals - 02/09/20 1346      PT SHORT TERM GOAL #1   Title  Patient will be able to stand for 15 min to be able to cook a small meal for her self.    Baseline  5 min (eval); 30 min (02/09/20)    Time  3    Period  Weeks    Status  Achieved    Target Date  02/08/20      PT SHORT TERM GOAL #2   Title  Pt will be able to ambulate for 30 min of continous walk to improve walking endurance to improve community ambulation    Baseline  15-20 min (eval); 15-20 min (02/09/20)    Time  3    Period  Weeks    Status  New    Target Date  02/08/20      PT SHORT TERM GOAL #3   Title  Pt will be able to carry 5 lb for 115' to assist with getting grocery  bags from car to home    Baseline  not attempted (Eval); met (able to carry light objects)    Time  3    Period  Weeks    Status  Achieved    Target Date  02/08/20      PT SHORT TERM GOAL #4   Title  Pt will be able to perform 5 x unilateral heel raises with bil HHA to improve strength of bil plantarflexors    Baseline  unable, 10x (02/09/20)    Time  3    Period  Weeks    Status  New    Target Date  02/08/20      PT SHORT TERM GOAL #5   Title  Pt will be able to perform 1x sit to stand without use of HHA to improve functional strength    Baseline  unable to without HHA support (eval); 5x sit to stand time without UE support (13.32 sec)    Time  3    Period  Weeks    Status  Achieved      PT SHORT TERM GOAL #6   Title  Pt will be able to get in and out of her husband's truck with SBA to improve functional transfers    Baseline  min to mod A provided by husband    Time  3    Period  Weeks    Status  Achieved    Target Date  02/08/20        PT Long Term Goals - 02/09/20 1354      PT LONG TERM GOAL #2   Baseline  13.32 sec without UE support (02/09/20)    Time  8    Status  Achieved            Plan - 02/09/20 1413    Clinical Impression Statement  Pt demo significant improvement in 5x sit to stand and DGI. Patient sill has mild weakness in bil hip flexors. Patient still has decreased balance  on R LE compared to L LE. patient is amking good progress towards her short term and long term functional goals.    Personal Factors and Comorbidities  Time since onset of injury/illness/exacerbation;Transportation    Examination-Activity Limitations  Caring for Others;Carry;Lift;Locomotion Level;Squat;Stairs;Stand;Transfers    Examination-Participation Restrictions  Cleaning;Meal Prep;Shop;Yard Work;Laundry;Driving;Community Activity    Stability/Clinical Decision Making  Evolving/Moderate complexity    Rehab Potential  Good    PT Frequency 1-2x / week    PT Duration  6-8 weeks     PT Treatment/Interventions  ADLs/Self Care Home Management;Gait training;Stair training;Functional mobility training;Therapeutic activities;Therapeutic exercise;Balance training;Neuromuscular re-education;Manual techniques;Patient/family education    PT Next Visit Plan  Review and progress HEP, add balance exercises       Patient will benefit from skilled therapeutic intervention in order to improve the following deficits and impairments:  Abnormal gait, Decreased balance, Decreased cognition, Decreased endurance, Decreased strength, Decreased mobility, Difficulty walking  Visit Diagnosis: Muscle weakness (generalized)  Unsteadiness on feet     Problem List Patient Active Problem List   Diagnosis Date Noted  . Reactive depression   . Wernicke's encephalopathy   . Transaminitis   . Steroid-induced hyperglycemia   . Leucocytosis   . Orthostasis   . Macrocytic anemia   . AMS (altered mental status)   . Sleep disturbance   . Pressure injury of skin 01/05/2020  . Alcoholic hepatitis with ascites   . Acute respiratory failure ([facility])   . Palliative care encounter   . Elevated bilirubin   . Ascites   . Advanced care planning/counseling discussion   . Goals of care, counseling/discussion   . Palliative care by specialist   . Hypoalbuminemia   . Hepatic encephalopathy ([facility]) 12/24/2019  . Alcoholic hepatitis without ascites   . Hypokalemia due to inadequate potassium intake 04/02/2019  . Alcohol withdrawal ([facility]) 10/24/2018  . Alcohol dependence with withdrawal, unspecified ([facility]) 10/24/2018  . Elevated LFTs 10/24/2018  . Thrombocytopenia ([facility]) 10/24/2018  . Alcohol-induced acute pancreatitis   . Acute blood loss anemia   . Alcoholic ketoacidosis 04/23/4817  . Alcohol abuse with alcohol-induced mood disorder ([facility]) 09/14/2017  . Elevated liver enzymes 04/29/2017    Kerrie Pleasure, PT 02/09/2020, 2:16 PM  [facility] [address] [place], [place], [address] Phone: [phone]   Fax:  [phone]  Name: Joann [name] MRN: [id number] Date of Birth: 11-05-1980

## 2020-02-12 ENCOUNTER — Ambulatory Visit: Payer: Medicaid Other

## 2020-02-12 ENCOUNTER — Encounter: Payer: Medicaid Other | Admitting: Occupational Therapy

## 2020-02-15 ENCOUNTER — Ambulatory Visit: Payer: Medicaid Other

## 2020-02-15 ENCOUNTER — Ambulatory Visit: Payer: Medicaid Other | Admitting: Speech Pathology

## 2020-02-15 ENCOUNTER — Encounter: Payer: Self-pay | Admitting: Speech Pathology

## 2020-02-15 ENCOUNTER — Other Ambulatory Visit: Payer: Self-pay

## 2020-02-15 DIAGNOSIS — R2681 Unsteadiness on feet: Secondary | ICD-10-CM

## 2020-02-15 DIAGNOSIS — R41841 Cognitive communication deficit: Secondary | ICD-10-CM

## 2020-02-15 DIAGNOSIS — M6281 Muscle weakness (generalized): Secondary | ICD-10-CM

## 2020-02-15 DIAGNOSIS — R471 Dysarthria and anarthria: Secondary | ICD-10-CM

## 2020-02-15 NOTE — Therapy (Signed)
[facility] [address] [place], [place], [address] Phone: [phone]   Fax:  [phone]  Speech Language Pathology Treatment  Patient Details  Name: Joann [name] MRN: [id number] Date of Birth: February 28, 1981 Referring Provider (SLP): Dr. Alger Simons   Encounter Date: 02/15/2020  End of Session - 02/15/20 1227    Visit Number  5    Number of Visits  17    Date for SLP Re-Evaluation  03/14/20    Authorization Time Period  medicaid approved 10 visits 5/20-03/16/20    SLP Start Time  1016    SLP Stop Time   1103    SLP Time Calculation (min)  47 min    Activity Tolerance  Patient tolerated treatment well       Past Medical History:  Diagnosis Date  . Alcohol abuse   . Anxiety   . Hypertension   . Liver disease     Past Surgical History:  Procedure Laterality Date  . ECTOPIC PREGNANCY SURGERY    . ESOPHAGOGASTRODUODENOSCOPY (EGD) WITH PROPOFOL N/A 12/27/2019   Procedure: ESOPHAGOGASTRODUODENOSCOPY (EGD) WITH PROPOFOL;  Surgeon: Carol Ada, MD;  Location: [facility];  Service: Endoscopy;  Laterality: N/A;    There were no vitals filed for this visit.  Subjective Assessment - 02/15/20 1216    Subjective  "I got a little frustrated: re: HW    Patient is accompained by:  Family member   daughter Rob Bunting   Currently in Pain?  No/denies            ADULT SLP TREATMENT - 02/15/20 1217      General Information   Behavior/Cognition  Alert;Cooperative;Distractible;Pleasant mood      Treatment Provided   Treatment provided  Cognitive-Linquistic      Cognitive-Linquistic Treatment   Treatment focused on  Cognition;Patient/family/caregiver education    Skilled Treatment  Reviewed HW with pt (attention to details, problem solving, functional money math) reading sports schedules and ticket options. [name] had 4/6 incorrect answers. She required extended time, verbal cues and visual cues to process what  question was answering, working memory to recall question and information provided on the chart.  Frequent max A for money math. Targeted attention, working memory, attention, problem solving with reading bank hours and time math with consistent mod to max A. Pt appears to have increased difficulty processing today, had busy weekend celebrating spouse's birthday which may contribute. 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) Educated  Patient;Child(ren)    Methods  Explanation;Verbal cues    Comprehension  Verbal cues required;Need further instruction       SLP Short Term Goals - 02/15/20 1226      SLP SHORT TERM GOAL #1   Title  Pt will use to -do list to complete 3 tasks/chores a day over 2 sessions with rare min A from family    Baseline  Pt is not doing chores/tasks and not using reminders    Time  2    Period  Weeks    Status  On-going      SLP SHORT  TERM GOAL #2   Title  Pt will verbalize emergent awareness of 3 cogntve impairments with rare min A over 3 sessions    Baseline  intellectual awareness of 1 cognitive impairment    Time  2    Period  Weeks    Status  On-going      SLP SHORT TERM GOAL #3   Title  Pt will meal plan for 1 week including grocery list with occasional min A from family    Baseline  Pt is not currently doing this, it was her job PTA    Time  2    Period  Weeks    Status  On-going       SLP Long Term Goals - 02/15/20 [place] #1   Title  Pt will use external aids to organize and manage medications accurately over 2 sessions    Baseline  daughter is managing her meds    Time  6    Period  Weeks    Status   On-going      SLP LONG TERM GOAL #2   Title  Pt will utilize compensations for attention, memory, safety to complete light meal prep with rare min A from family over 2 sessions    Baseline  This was pt's job PTA, currently not doing    Time  6    Period  Weeks    Status  On-going      SLP LONG TERM GOAL #3   Title  Pt will be intelligible and speak at 70-74dB in noisy environment with rare min A    Baseline  sub 70dB, reduced intelligibility reported by spouse    Time  6    Period  Weeks    Status  On-going       Plan - 02/15/20 1226    Clinical Impression Statement  Ongoing training in compensatory strategies for memory and attention impairments.She is using strategies to successfully manage her medications independently at home and has cooked some family meals. Ongoing use of journal to recall daily events. Continue skilled ST to maximize cognitive linguistic skills for eventual return to work. Pt/spouse goal for return to work is Late July or August.    Speech Therapy Frequency  2x / week    Duration  --   8 weeks or 17 visits   Treatment/Interventions  Language facilitation;Environmental controls;Cueing hierarchy;SLP instruction and feedback;Compensatory strategies;Functional tasks;Cognitive reorganization;Compensatory techniques;Patient/family education;Multimodal communcation approach;Internal/external aids    Potential to Achieve Goals  Good    Potential Considerations  Ability to learn/carryover information       Patient will benefit from skilled therapeutic intervention in order to improve the following deficits and impairments:   Cognitive communication deficit  Dysarthria and anarthria    Problem List Patient Active Problem List   Diagnosis Date Noted  . Reactive depression   . Wernicke's encephalopathy   . Transaminitis   . Steroid-induced hyperglycemia   . Leucocytosis   . Orthostasis   . Macrocytic anemia   . AMS (altered mental status)   . Sleep  disturbance   . Pressure injury of skin 01/05/2020  . Alcoholic hepatitis with ascites   . Acute respiratory failure ([place])   . Palliative care encounter   . Elevated bilirubin   . Ascites   . Advanced care planning/counseling discussion   . Goals of care, counseling/discussion   . Palliative care by specialist   . Hypoalbuminemia   .  Hepatic encephalopathy ([place]) 12/24/2019  . Alcoholic hepatitis without ascites   . Hypokalemia due to inadequate potassium intake 04/02/2019  . Alcohol withdrawal ([place]) 10/24/2018  . Alcohol dependence with withdrawal, unspecified ([place]) 10/24/2018  . Elevated LFTs 10/24/2018  . Thrombocytopenia ([place]) 10/24/2018  . Alcohol-induced acute pancreatitis   . Acute blood loss anemia   . Alcoholic ketoacidosis 41/32/4401  . Alcohol abuse with alcohol-induced mood disorder ([place]) 09/14/2017  . Elevated liver enzymes 04/29/2017    Courtenay Creger, Annye Rusk MS, CCC-SLP 02/15/2020, 12:28 PM  [facility] [address] [place], [place], [address] Phone: [phone]   Fax:  [phone]   Name: Joann [name] MRN: [id number] Date of Birth: 09/16/1980

## 2020-02-15 NOTE — Therapy (Addendum)
[facility] [address] [place], [place], [address] Phone: [phone]   Fax:  [phone]  Physical Therapy Treatment  Patient Details  Name: AVIANA SHEVLIN MRN: [id number] Date of Birth: 05-18-1981 Referring Provider (PT): Dr. Leeroy Cha   Encounter Date: 02/15/2020  PT End of Session - 02/15/20 1135    Visit Number  4    Number of Visits  19    Date for PT Re-Evaluation  03/14/20    Authorization Type  Medicaid    Authorization Time Period  01/26/20 to 02/15/20    Authorization - Visit Number  3    Authorization - Number of Visits  3    Progress Note Due on Visit  3    PT Start Time  0945    PT Stop Time  1015    PT Time Calculation (min)  30 min    Equipment Utilized During Treatment  Gait belt    Activity Tolerance  Patient tolerated treatment well    Behavior During Therapy  [facility] for tasks assessed/performed       Past Medical History:  Diagnosis Date  . Alcohol abuse   . Anxiety   . Hypertension   . Liver disease     Past Surgical History:  Procedure Laterality Date  . ECTOPIC PREGNANCY SURGERY    . ESOPHAGOGASTRODUODENOSCOPY (EGD) WITH PROPOFOL N/A 12/27/2019   Procedure: ESOPHAGOGASTRODUODENOSCOPY (EGD) WITH PROPOFOL;  Surgeon: Carol Ada, MD;  Location: [facility];  Service: Endoscopy;  Laterality: N/A;    There were no vitals filed for this visit.  Subjective Assessment - 02/15/20 1005    Subjective  I didn't get a chance to do exercises as it was my husband's birthday    Patient is accompained by:  Family member   Husband   Pertinent History  hepatic encephalopathy, Wernicke's encephalopathy    Limitations  Standing;Walking;House hold activities;Lifting    How long can you sit comfortably?  no issues    How long can you stand comfortably?  5 min    How long can you walk comfortably?  15-20 min with support of cart    Patient Stated Goals  "get stronger"    Pain Onset  Today    Pain  Onset  In the past 7 days            Sit to stand with 15lb KB: 2 x 10 Standing hip flexion, abduction, extension: red band: 2 x 10 R and L Side steps and monster walks: red band: 3 x 10                      PT Education - 02/15/20 1134    Education Details  Pt educated on downloading a running/walking app on her phone she can track her walking pace to meet her long term goal.    Person(s) Educated  Patient    Methods  Explanation    Comprehension  Verbalized understanding       PT Short Term Goals - 02/15/20 1010      PT SHORT TERM GOAL #1   Title  Patient will be able to stand for 15 min to be able to cook a small meal for her self.    Baseline  5 min (eval); 30 min (02/09/20)    Time  3    Period  Weeks    Status  Achieved    Target Date  02/08/20  PT SHORT TERM GOAL #2   Title  Pt will be able to ambulate for 30 min of continous walk to improve walking endurance to improve community ambulation    Baseline  15-20 min (eval); 15-20 min (02/09/20)    Time  3    Period  Weeks    Status  Achieved    Target Date  02/08/20      PT SHORT TERM GOAL #3   Title  Pt will be able to carry 5 lb for 115' to assist with getting grocery bags from car to home    Baseline  not attempted (Eval); met (able to carry light objects)    Time  3    Period  Weeks    Status  Achieved    Target Date  02/08/20      PT SHORT TERM GOAL #4   Title  Pt will be able to perform 5 x unilateral heel raises with bil HHA to improve strength of bil plantarflexors    Baseline  unable, 10x (02/09/20)    Time  3    Period  Weeks    Status  Achieved    Target Date  02/08/20      PT SHORT TERM GOAL #5   Title  Pt will be able to perform 1x sit to stand without use of HHA to improve functional strength    Baseline  unable to without HHA support (eval); 5x sit to stand time without UE support (13.32 sec)    Time  3    Period  Weeks    Status  Achieved      PT SHORT TERM GOAL #6    Title  Pt will be able to get in and out of her husband's truck with SBA to improve functional transfers    Baseline  min to mod A provided by husband, SBA (02/15/20)    Time  3    Period  Weeks    Status  Achieved    Target Date  02/08/20        PT Long Term Goals - 02/15/20 1006      PT LONG TERM GOAL #1   Title  Pt will demo 56/56 on BBS to improve overall balance and reduce fall risk    Baseline  51/56, 02/09/20 56/56    Time  8    Period  Weeks    Status  Achieved    Target Date  03/14/20      PT LONG TERM GOAL #2   Title  Pt will be able to perform 5x sit to stand without use of HHA in under 20 seconds to improve overall functional strength    Baseline  13.32 sec without UE support (02/09/20)    Time  8    Period  Weeks    Status  Achieved    Target Date  03/14/20      PT LONG TERM GOAL #3   Title  Pt will be able to ambulate 1 mile in under 25 min to improve functional ambulation and to return to PLOF.    Baseline  not attempted to walk a mile yet    Time  8    Period  Weeks    Status  New    Target Date  03/14/20      PT LONG TERM GOAL #4   Title  Pt will demo 5/5 strength in Bil LE to improve overall strength and transfers.  Baseline  3+-4/5 grossly    Time  8    Period  Weeks    Status  New    Target Date  12/14/19            Plan - 02/15/20 1133    Clinical Impression Statement  Patient is making an excellent progress towards her goals. She has met all of her STG as of today. She will be dropped down to once a week.    Personal Factors and Comorbidities  Time since onset of injury/illness/exacerbation;Transportation    Examination-Activity Limitations  Caring for Others;Carry;Lift;Locomotion Level;Squat;Stairs;Stand;Transfers    Examination-Participation Restrictions  Cleaning;Meal Prep;Shop;Yard Work;Laundry;Driving;Community Activity    Stability/Clinical Decision Making  Evolving/Moderate complexity    Clinical Decision Making  Moderate    Rehab  Potential  Good    PT Frequency  2x / week    PT Duration  8 weeks    PT Treatment/Interventions  ADLs/Self Care Home Management;Gait training;Stair training;Functional mobility training;Therapeutic activities;Therapeutic exercise;Balance training;Neuromuscular re-education;Manual techniques;Patient/family education    PT Next Visit Plan  once a week now for couple of sessions, review HEP and progress    Consulted and Agree with Plan of Care  Patient       Patient will benefit from skilled therapeutic intervention in order to improve the following deficits and impairments:  Abnormal gait, Decreased balance, Decreased cognition, Decreased endurance, Decreased strength, Decreased mobility, Difficulty walking  Visit Diagnosis: Muscle weakness (generalized)  Unsteadiness on feet     Problem List Patient Active Problem List   Diagnosis Date Noted  . Reactive depression   . Wernicke's encephalopathy   . Transaminitis   . Steroid-induced hyperglycemia   . Leucocytosis   . Orthostasis   . Macrocytic anemia   . AMS (altered mental status)   . Sleep disturbance   . Pressure injury of skin 01/05/2020  . Alcoholic hepatitis with ascites   . Acute respiratory failure ([place])   . Palliative care encounter   . Elevated bilirubin   . Ascites   . Advanced care planning/counseling discussion   . Goals of care, counseling/discussion   . Palliative care by specialist   . Hypoalbuminemia   . Hepatic encephalopathy ([place]) 12/24/2019  . Alcoholic hepatitis without ascites   . Hypokalemia due to inadequate potassium intake 04/02/2019  . Alcohol withdrawal ([place]) 10/24/2018  . Alcohol dependence with withdrawal, unspecified ([place]) 10/24/2018  . Elevated LFTs 10/24/2018  . Thrombocytopenia ([place]) 10/24/2018  . Alcohol-induced acute pancreatitis   . Acute blood loss anemia   . Alcoholic ketoacidosis 95/97/4718  . Alcohol abuse with alcohol-induced mood disorder ([place]) 09/14/2017  . Elevated liver  enzymes 04/29/2017    Kerrie Pleasure, PT 02/15/2020, 11:38 AM  [facility] [address] [place], [place], [address] Phone: [phone]   Fax:  [phone]  Name: ADELENA DESANTIAGO MRN: [id number] Date of Birth: 1981-03-16

## 2020-02-17 ENCOUNTER — Encounter: Payer: Self-pay | Admitting: Speech Pathology

## 2020-02-17 ENCOUNTER — Encounter: Payer: Medicaid Other | Admitting: Occupational Therapy

## 2020-02-17 ENCOUNTER — Other Ambulatory Visit: Payer: Self-pay

## 2020-02-17 ENCOUNTER — Ambulatory Visit: Payer: Medicaid Other | Admitting: Speech Pathology

## 2020-02-17 ENCOUNTER — Ambulatory Visit: Payer: Medicaid Other

## 2020-02-17 DIAGNOSIS — R471 Dysarthria and anarthria: Secondary | ICD-10-CM

## 2020-02-17 DIAGNOSIS — R41841 Cognitive communication deficit: Secondary | ICD-10-CM

## 2020-02-17 DIAGNOSIS — M6281 Muscle weakness (generalized): Secondary | ICD-10-CM | POA: Diagnosis not present

## 2020-02-17 NOTE — Therapy (Signed)
[facility] [address] [place], [place], [address] Phone: [phone]   Fax:  [phone]  Speech Language Pathology Treatment  Patient Details  Name: Joann [name] MRN: [id number] Date of Birth: November 29, 1980 Referring Provider (SLP): Dr. Alger Simons   Encounter Date: 02/17/2020  End of Session - 02/17/20 1224    Visit Number  6    Number of Visits  17    Date for SLP Re-Evaluation  03/14/20    Authorization Type  medicaid - Pt receiving PT and OT evals - PT requesting 12, I will request 10    Authorization Time Period  medicaid approved 10 visits 5/20-03/16/20    Authorization - Visit Number  4    Authorization - Number of Visits  10    SLP Start Time  1017    SLP Stop Time   1102    SLP Time Calculation (min)  45 min    Activity Tolerance  Patient tolerated treatment well       Past Medical History:  Diagnosis Date  . Alcohol abuse   . Anxiety   . Hypertension   . Liver disease     Past Surgical History:  Procedure Laterality Date  . ECTOPIC PREGNANCY SURGERY    . ESOPHAGOGASTRODUODENOSCOPY (EGD) WITH PROPOFOL N/A 12/27/2019   Procedure: ESOPHAGOGASTRODUODENOSCOPY (EGD) WITH PROPOFOL;  Surgeon: Carol Ada, MD;  Location: [facility];  Service: Endoscopy;  Laterality: N/A;    There were no vitals filed for this visit.  Subjective Assessment - 02/17/20 1038    Subjective  "I have a question about my skin"    Patient is accompained by:  Family member   daughter, Rob Bunting   Currently in Pain?  No/denies            ADULT SLP TREATMENT - 02/17/20 1039      General Information   Behavior/Cognition  Alert;Cooperative;Distractible;Pleasant mood      Treatment Provided   Treatment provided  Cognitive-Linquistic      Cognitive-Linquistic Treatment   Treatment focused on  Cognition;Patient/family/caregiver education    Skilled Treatment  Pt brought in [facility] parially completed. She did a general  grocery list, but did not do weekly meal plan with coordinated grocery list. We targeted attention, organization, problem solving and awareness generating lunch and dinner menu for a week with extended time due to slow processing. She required verbal cues for decision making and awareness of errors (spelling and logic). Sinda required extended time and frequent verbal cues to generate accurate grocery list based on her menu plan. She required cues when she missed items and cues for spelling errors.       Assessment / Recommendations / Plan   Plan  Continue with current plan of care      Progression Toward Goals   Progression toward goals  Progressing toward goals         SLP Short Term Goals - 02/17/20 1211      SLP SHORT TERM GOAL #1   Title  Pt will use to -do list to complete 3 tasks/chores a day over 2 sessions with rare min A from family    Baseline  Pt is not doing chores/tasks and not using reminders    Time  2    Period  Weeks    Status  Achieved      SLP SHORT TERM GOAL #2   Title  Pt will verbalize emergent awareness of 3 cogntve impairments with rare min  A over 3 sessions    Baseline  intellectual awareness of 1 cognitive impairment    Time  2    Period  Weeks    Status  On-going      SLP SHORT TERM GOAL #3   Title  Pt will meal plan for 1 week including grocery list with occasional min A from family    Baseline  Pt is not currently doing this, it was her job PTA    Time  2    Period  Weeks    Status  On-going       SLP Long Term Goals - 02/17/20 1224      SLP LONG TERM GOAL #1   Title  Pt will use external aids to organize and manage medications accurately over 2 sessions    Baseline  daughter is managing her meds    Time  6    Period  Weeks    Status  On-going      SLP LONG TERM GOAL #2   Title  Pt will utilize compensations for attention, memory, safety to complete light meal prep with rare min A from family over 2 sessions    Baseline  This was pt's job  PTA, currently not doing    Time  6    Period  Weeks    Status  On-going      SLP LONG TERM GOAL #3   Title  Pt will be intelligible and speak at 70-74dB in noisy environment with rare min A    Baseline  sub 70dB, reduced intelligibility reported by spouse    Time  6    Period  Weeks    Status  On-going       Plan - 02/17/20 1210    Clinical Impression Statement  Ongoing training in compensatory strategies for memory and attention impairments.She is using strategies to successfully manage her medications independently at home and has cooked some family meals. Ongoing use of journal to recall daily events. Continue skilled ST to maximize cognitive linguistic skills for eventual return to work. Pt/spouse goal for return to work is Late July or August.    Speech Therapy Frequency  2x / week    Duration  --   8 weeks or 17 visits   Treatment/Interventions  Language facilitation;Environmental controls;Cueing hierarchy;SLP instruction and feedback;Compensatory strategies;Functional tasks;Cognitive reorganization;Compensatory techniques;Patient/family education;Multimodal communcation approach;Internal/external aids    Potential to Achieve Goals  Good    Potential Considerations  Ability to learn/carryover information       Patient will benefit from skilled therapeutic intervention in order to improve the following deficits and impairments:   Cognitive communication deficit  Dysarthria and anarthria    Problem List Patient Active Problem List   Diagnosis Date Noted  . Reactive depression   . Wernicke's encephalopathy   . Transaminitis   . Steroid-induced hyperglycemia   . Leucocytosis   . Orthostasis   . Macrocytic anemia   . AMS (altered mental status)   . Sleep disturbance   . Pressure injury of skin 01/05/2020  . Alcoholic hepatitis with ascites   . Acute respiratory failure ([place])   . Palliative care encounter   . Elevated bilirubin   . Ascites   . Advanced care  planning/counseling discussion   . Goals of care, counseling/discussion   . Palliative care by specialist   . Hypoalbuminemia   . Hepatic encephalopathy ([place]) 12/24/2019  . Alcoholic hepatitis without ascites   . Hypokalemia due to inadequate  potassium intake 04/02/2019  . Alcohol withdrawal ([place]) 10/24/2018  . Alcohol dependence with withdrawal, unspecified ([place]) 10/24/2018  . Elevated LFTs 10/24/2018  . Thrombocytopenia ([place]) 10/24/2018  . Alcohol-induced acute pancreatitis   . Acute blood loss anemia   . Alcoholic ketoacidosis 11/01/7979  . Alcohol abuse with alcohol-induced mood disorder ([place]) 09/14/2017  . Elevated liver enzymes 04/29/2017    Chayce Rullo, Annye Rusk MS, CCC-SLP 02/17/2020, 12:36 PM  [facility] [address] [place], [place], [address] Phone: [phone]   Fax:  [phone]   Name: Joann [name] MRN: [id number] Date of Birth: [birth year]

## 2020-02-22 ENCOUNTER — Ambulatory Visit: Payer: Medicaid Other

## 2020-02-24 ENCOUNTER — Ambulatory Visit: Payer: Medicaid Other

## 2020-02-24 ENCOUNTER — Encounter: Payer: Medicaid Other | Admitting: Occupational Therapy

## 2020-02-24 ENCOUNTER — Ambulatory Visit: Payer: Medicaid Other | Admitting: Speech Pathology

## 2020-02-25 ENCOUNTER — Ambulatory Visit: Payer: Medicaid Other | Admitting: Speech Pathology

## 2020-02-25 ENCOUNTER — Ambulatory Visit: Payer: Medicaid Other

## 2020-02-26 ENCOUNTER — Encounter
Payer: Medicaid Other | Attending: Physical Medicine and Rehabilitation | Admitting: Physical Medicine and Rehabilitation

## 2020-02-26 ENCOUNTER — Ambulatory Visit: Payer: Medicaid Other

## 2020-02-26 ENCOUNTER — Ambulatory Visit: Payer: Medicaid Other | Admitting: Occupational Therapy

## 2020-02-26 DIAGNOSIS — K7290 Hepatic failure, unspecified without coma: Secondary | ICD-10-CM | POA: Insufficient documentation

## 2020-02-26 DIAGNOSIS — F1014 Alcohol abuse with alcohol-induced mood disorder: Secondary | ICD-10-CM | POA: Insufficient documentation

## 2020-02-26 DIAGNOSIS — I951 Orthostatic hypotension: Secondary | ICD-10-CM | POA: Insufficient documentation

## 2020-02-26 DIAGNOSIS — E512 Wernicke's encephalopathy: Secondary | ICD-10-CM | POA: Insufficient documentation

## 2020-02-26 DIAGNOSIS — K7011 Alcoholic hepatitis with ascites: Secondary | ICD-10-CM | POA: Insufficient documentation

## 2020-02-29 ENCOUNTER — Other Ambulatory Visit: Payer: Self-pay

## 2020-02-29 ENCOUNTER — Ambulatory Visit: Payer: Medicaid Other | Admitting: Speech Pathology

## 2020-02-29 ENCOUNTER — Encounter: Payer: Self-pay | Admitting: Speech Pathology

## 2020-02-29 ENCOUNTER — Ambulatory Visit: Payer: Medicaid Other

## 2020-02-29 DIAGNOSIS — M6281 Muscle weakness (generalized): Secondary | ICD-10-CM

## 2020-02-29 DIAGNOSIS — R41841 Cognitive communication deficit: Secondary | ICD-10-CM

## 2020-02-29 DIAGNOSIS — R471 Dysarthria and anarthria: Secondary | ICD-10-CM

## 2020-02-29 DIAGNOSIS — R2681 Unsteadiness on feet: Secondary | ICD-10-CM

## 2020-02-29 DIAGNOSIS — K7682 Hepatic encephalopathy: Secondary | ICD-10-CM

## 2020-02-29 NOTE — Therapy (Signed)
[facility] [address] [place], [place], [address] Phone: [phone]   Fax:  [phone]  Speech Language Pathology Treatment  Patient Details  Name: Joann [name] MRN: [id number] Date of Birth: Dec 09, 1980 Referring Provider (SLP): Dr. Alger Simons   Encounter Date: 02/29/2020   End of Session - 02/29/20 1230    Visit Number 7    Number of Visits 17    Date for SLP Re-Evaluation 03/14/20    Authorization Time Period medicaid approved 10 visits 5/20-03/16/20    Authorization - Visit Number 5    Authorization - Number of Visits 10    SLP Start Time 6606    SLP Stop Time  0930    SLP Time Calculation (min) 43 min    Activity Tolerance Patient tolerated treatment well           Past Medical History:  Diagnosis Date  . Alcohol abuse   . Anxiety   . Hypertension   . Liver disease     Past Surgical History:  Procedure Laterality Date  . ECTOPIC PREGNANCY SURGERY    . ESOPHAGOGASTRODUODENOSCOPY (EGD) WITH PROPOFOL N/A 12/27/2019   Procedure: ESOPHAGOGASTRODUODENOSCOPY (EGD) WITH PROPOFOL;  Surgeon: Carol Ada, MD;  Location: [facility];  Service: Endoscopy;  Laterality: N/A;    There were no vitals filed for this visit.   Subjective Assessment - 02/29/20 1227    Subjective "I didn't feel well due to women's trouble"    Patient is accompained by: Family member   daughter Rob Bunting   Currently in Pain? No/denies                 ADULT SLP TREATMENT - 02/29/20 0854      General Information   Behavior/Cognition Alert;Cooperative;Distractible;Pleasant mood      Cognitive-Linquistic Treatment   Treatment focused on Cognition;Patient/family/caregiver education    Skilled Treatment HW completed and accurate. She reports successful completion of 5+ chores/IADL's a day. She utilized strategy of organizing grocery lists according to category/location with success. Samanthamarie required min verbal cues and  explanation to verbalize emergent awareness of attention, processing. She reports increased frustration with her sons - we discussed sensory overstimulation and instructed her to take breaks in quiet rooms when she feels overwhelmed or agitiated. We generated 4 work situations where attention, processing and memory may affect her work Building surveyor strategies for these tasks. Role played a resident injury/new pain with rare min A.       Assessment / Recommendations / Plan   Plan Continue with current plan of care      Progression Toward Goals   Progression toward goals Progressing toward goals            SLP Education - 02/29/20 1228    Education Details compensations for attention, memory, activities to promote anticipatory awareness    Person(s) Educated Patient;Child(ren)    Methods Explanation;Verbal cues    Comprehension Verbalized understanding;Need further instruction            SLP Short Term Goals - 02/29/20 1229      SLP SHORT TERM GOAL #1   Title Pt will use to -do list to complete 3 tasks/chores a day over 2 sessions with rare min A from family    Baseline Pt is not doing chores/tasks and not using reminders    Time 2    Period Weeks    Status Achieved      SLP SHORT TERM GOAL #2  Title Pt will verbalize emergent awareness of 3 cogntve impairments with rare min A over 3 sessions    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    Time 2    Period Weeks    Status On-going      SLP SHORT TERM GOAL #3   Title Pt will meal plan for 1 week including grocery list with occasional min A from family    Baseline Pt is not currently doing this, it was her job PTA    Time 2    Period Weeks    Status On-going            SLP Long Term Goals - 02/29/20 1229      SLP LONG TERM GOAL #1   Title Pt will use external aids to organize and manage medications accurately over 2 sessions    Baseline daughter is managing her meds    Time 5     Period Weeks    Status On-going      SLP LONG TERM GOAL #2   Title Pt will utilize compensations for attention, memory, safety to complete light meal prep with rare min A from family over 2 sessions    Baseline This was pt's job PTA, currently not doing    Time 5    Period Weeks    Status On-going      SLP LONG TERM GOAL #3   Title Pt will be intelligible and speak at 70-74dB in noisy environment with rare min A    Baseline sub 70dB, reduced intelligibility reported by spouse    Time 5    Period Weeks    Status On-going            Plan - 02/29/20 1229    Clinical Impression Statement Ongoing training in compensatory strategies for memory and attention impairments.She is using strategies to successfully manage her medications independently at home and has cooked some family meals. Ongoing use of journal to recall daily events. Continue skilled ST to maximize cognitive linguistic skills for eventual return to work. Pt/spouse goal for return to work is Late July or August.    Speech Therapy Frequency 2x / week    Duration --   8 weeks sor 17 visits   Treatment/Interventions Language facilitation;Environmental controls;Cueing hierarchy;SLP instruction and feedback;Compensatory strategies;Functional tasks;Cognitive reorganization;Compensatory techniques;Patient/family education;Multimodal communcation approach;Internal/external aids    Potential to Achieve Goals Good    Potential Considerations Ability to learn/carryover information           Patient will benefit from skilled therapeutic intervention in order to improve the following deficits and impairments:   Cognitive communication deficit  Dysarthria and anarthria    Problem List Patient Active Problem List   Diagnosis Date Noted  . Reactive depression   . Wernicke's encephalopathy   . Transaminitis   . Steroid-induced hyperglycemia   . Leucocytosis   . Orthostasis   . Macrocytic anemia   . AMS (altered mental status)    . Sleep disturbance   . Pressure injury of skin 01/05/2020  . Alcoholic hepatitis with ascites   . Acute respiratory failure ([place])   . Palliative care encounter   . Elevated bilirubin   . Ascites   . Advanced care planning/counseling discussion   . Goals of care, counseling/discussion   . Palliative care by specialist   . Hypoalbuminemia   . Hepatic encephalopathy ([place]) 12/24/2019  . Alcoholic hepatitis without ascites   . Hypokalemia due to inadequate potassium intake 04/02/2019  .  Alcohol withdrawal ([place]) 10/24/2018  . Alcohol dependence with withdrawal, unspecified ([place]) 10/24/2018  . Elevated LFTs 10/24/2018  . Thrombocytopenia ([place]) 10/24/2018  . Alcohol-induced acute pancreatitis   . Acute blood loss anemia   . Alcoholic ketoacidosis 19/59/7471  . Alcohol abuse with alcohol-induced mood disorder ([place]) 09/14/2017  . Elevated liver enzymes 04/29/2017    Zarayah Lanting, Annye Rusk MS, CCC-SLP 02/29/2020, 12:31 PM  [facility] [address] [place], [place], [address] Phone: [phone]   Fax:  [phone]   Name: Joann [name] MRN: [id number] Date of Birth: 02/23/1981

## 2020-02-29 NOTE — Patient Instructions (Addendum)
    Your brain is working hard to process all of the sensory information (noises, lights, temperatures) - when your brain gets over stimulated you can get agitated and frustrated - this is a cue from your brain that you need a break - go to a quiet place and let your brain reset  When running errands, you can take a break in your car and sit quietly in the parking lot to let your brain reset in between errands  Repeat back what you hear at work when it is important - Ask for repetition when things don't make sense  Repeat back what you hear at the doctors visits as well  List some areas at work where you need to remember things, may get distracted or your brain may get overwhelmed with and some strategies you can use to help you with recall, attention and problem solving  Use you mini notebook at work

## 2020-02-29 NOTE — Therapy (Signed)
[facility] [address] [place], [place], [address] Phone: [phone]   Fax:  [phone]  Physical Therapy Treatment  Patient Details  Name: Joann [name] MRN: [id number] Date of Birth: 1981/08/30 Referring Provider (PT): Dr. Leeroy Cha   Encounter Date: 02/29/2020   PT End of Session - 02/29/20 1002    Visit Number 5    Number of Visits 19    Date for PT Re-Evaluation 03/14/20    Authorization Type Medicaid    Authorization Time Period 02/22/20 to 04/03/20    Authorization - Visit Number 1    Authorization - Number of Visits 5    Progress Note Due on Visit 9    PT Start Time 0930    PT Stop Time 1015    PT Time Calculation (min) 45 min           Past Medical History:  Diagnosis Date  . Alcohol abuse   . Anxiety   . Hypertension   . Liver disease     Past Surgical History:  Procedure Laterality Date  . ECTOPIC PREGNANCY SURGERY    . ESOPHAGOGASTRODUODENOSCOPY (EGD) WITH PROPOFOL N/A 12/27/2019   Procedure: ESOPHAGOGASTRODUODENOSCOPY (EGD) WITH PROPOFOL;  Surgeon: Carol Ada, MD;  Location: [facility];  Service: Endoscopy;  Laterality: N/A;    There were no vitals filed for this visit.   Subjective Assessment - 02/29/20 1000    Subjective Pt reports she was sick for past few days. She has not done lot of exercises or walked. I am having some stomach mm spasms. Who do I go see?    Patient is accompained by: Family member    Pertinent History hepatic encephalopathy, Wernicke's encephalopathy    Limitations Standing;Walking;House hold activities;Lifting    How long can you sit comfortably? no issues    How long can you stand comfortably? 5 min    How long can you walk comfortably? 15-20 min with support of cart    Patient Stated Goals "get stronger"    Currently in Pain? No/denies                       Sit to stand with 15lb KB: 2 x 10 Standing hip flexion, abduction, extension:  red band: 4 x 3 reps, pt asked to do 3 kicks before she puts her foot down to work on balance and endurance Fwd step up: 8" box: pt carrying 15lb KB: 2 x 5 R and L Side steps and monster walks: green band: 3 x 10, cues to keep toes straight                      PT Education - 02/29/20 1002    Education Details Pt doesn not have PCP. Patient was educated to establish a PCP for her care here. Meanwhile, she can reschedule Dr. Aline August appt that she missed last Friday ad talk to her about that.    Person(s) Educated Patient;Child(ren)    Methods Explanation    Comprehension Verbalized understanding            PT Short Term Goals - 02/15/20 1010      PT SHORT TERM GOAL #1   Title Patient will be able to stand for 15 min to be able to cook a small meal for her self.    Baseline 5 min (eval); 30 min (02/09/20)    Time 3    Period Weeks  Status Achieved    Target Date 02/08/20      PT SHORT TERM GOAL #2   Title Pt will be able to ambulate for 30 min of continous walk to improve walking endurance to improve community ambulation    Baseline 15-20 min (eval); 15-20 min (02/09/20)    Time 3    Period Weeks    Status Achieved    Target Date 02/08/20      PT SHORT TERM GOAL #3   Title Pt will be able to carry 5 lb for 115' to assist with getting grocery bags from car to home    Baseline not attempted (Eval); met (able to carry light objects)    Time 3    Period Weeks    Status Achieved    Target Date 02/08/20      PT SHORT TERM GOAL #4   Title Pt will be able to perform 5 x unilateral heel raises with bil HHA to improve strength of bil plantarflexors    Baseline unable, 10x (02/09/20)    Time 3    Period Weeks    Status Achieved    Target Date 02/08/20      PT SHORT TERM GOAL #5   Title Pt will be able to perform 1x sit to stand without use of HHA to improve functional strength    Baseline unable to without HHA support (eval); 5x sit to stand time without UE  support (13.32 sec)    Time 3    Period Weeks    Status Achieved      PT SHORT TERM GOAL #6   Title Pt will be able to get in and out of her husband's truck with SBA to improve functional transfers    Baseline min to mod A provided by husband, SBA (02/15/20)    Time 3    Period Weeks    Status Achieved    Target Date 02/08/20             PT Long Term Goals - 02/15/20 1006      PT LONG TERM GOAL #1   Title Pt will demo 56/56 on BBS to improve overall balance and reduce fall risk    Baseline 51/56, 02/09/20 56/56    Time 8    Period Weeks    Status Achieved    Target Date 03/14/20      PT LONG TERM GOAL #2   Title Pt will be able to perform 5x sit to stand without use of HHA in under 20 seconds to improve overall functional strength    Baseline 13.32 sec without UE support (02/09/20)    Time 8    Period Weeks    Status Achieved    Target Date 03/14/20      PT LONG TERM GOAL #3   Title Pt will be able to ambulate 1 mile in under 25 min to improve functional ambulation and to return to PLOF.    Baseline not attempted to walk a mile yet    Time 8    Period Weeks    Status New    Target Date 03/14/20      PT LONG TERM GOAL #4   Title Pt will demo 5/5 strength in Bil LE to improve overall strength and transfers.    Baseline 3+-4/5 grossly    Time 8    Period Weeks    Status New    Target Date 12/14/19  Plan - 02/29/20 1020    Clinical Impression Statement Pt tolerated progression of exercises well. Pt has not worked on prgressing walking that much since she was sick. patient was educated on progressing her walking to about 30 min or 1 mile.    Personal Factors and Comorbidities Time since onset of injury/illness/exacerbation;Transportation    Examination-Activity Limitations Caring for Others;Carry;Lift;Locomotion Level;Squat;Stairs;Stand;Transfers    Examination-Participation Restrictions Cleaning;Meal Prep;Shop;Yard Work;Laundry;Driving;Community  Activity    Stability/Clinical Decision Making Evolving/Moderate complexity    Rehab Potential Good    PT Frequency 2x / week    PT Duration 8 weeks    PT Treatment/Interventions ADLs/Self Care Home Management;Gait training;Stair training;Functional mobility training;Therapeutic activities;Therapeutic exercise;Balance training;Neuromuscular re-education;Manual techniques;Patient/family education    PT Next Visit Plan once a week now for couple of sessions, review HEP and progress    Consulted and Agree with Plan of Care Patient           Patient will benefit from skilled therapeutic intervention in order to improve the following deficits and impairments:  Abnormal gait, Decreased balance, Decreased cognition, Decreased endurance, Decreased strength, Decreased mobility, Difficulty walking  Visit Diagnosis: Muscle weakness (generalized)  Unsteadiness on feet  Hepatic encephalopathy ([place])     Problem List Patient Active Problem List   Diagnosis Date Noted  . Reactive depression   . Wernicke's encephalopathy   . Transaminitis   . Steroid-induced hyperglycemia   . Leucocytosis   . Orthostasis   . Macrocytic anemia   . AMS (altered mental status)   . Sleep disturbance   . Pressure injury of skin 01/05/2020  . Alcoholic hepatitis with ascites   . Acute respiratory failure ([employer])   . Palliative care encounter   . Elevated bilirubin   . Ascites   . Advanced care planning/counseling discussion   . Goals of care, counseling/discussion   . Palliative care by specialist   . Hypoalbuminemia   . Hepatic encephalopathy ([place]) 12/24/2019  . Alcoholic hepatitis without ascites   . Hypokalemia due to inadequate potassium intake 04/02/2019  . Alcohol withdrawal ([place]) 10/24/2018  . Alcohol dependence with withdrawal, unspecified ([facility]) 10/24/2018  . Elevated LFTs 10/24/2018  . Thrombocytopenia ([facility]) 10/24/2018  . Alcohol-induced acute pancreatitis   . Acute blood loss anemia   .  Alcoholic ketoacidosis 21/30/8657  . Alcohol abuse with alcohol-induced mood disorder ([place]) 09/14/2017  . Elevated liver enzymes 04/29/2017    Kerrie Pleasure, PT 02/29/2020, 10:31 AM  [facility] [address] [place], [place], [address] Phone: [phone]   Fax:  [phone]  Name: BRYANAH SIDELL MRN: [id number] Date of Birth: August 20, 1981

## 2020-03-01 IMAGING — CT CT HEAD WITHOUT CONTRAST
4 series · 16 of 47 positions shown, 18 images · non-contrast
Comparison: None.

CLINICAL DATA: 37-year-old female with altered mental status,
fever, chills and cough.

EXAM:
CT HEAD WITHOUT CONTRAST
TECHNIQUE: Contiguous axial images were obtained from the base of the skull
through the vertex without intravenous contrast.

[Series 3: head wo · axial · 0.46mm/px · z∈[-163,-43]mm · 7 of 33 slices shown, 9 images]
[im 5/33  brain]
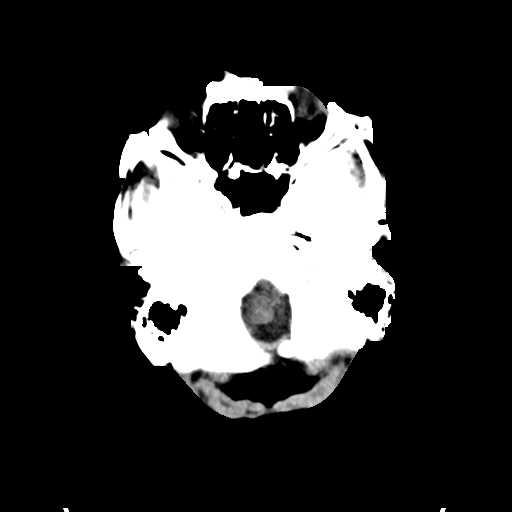
[im 5/33  bone]
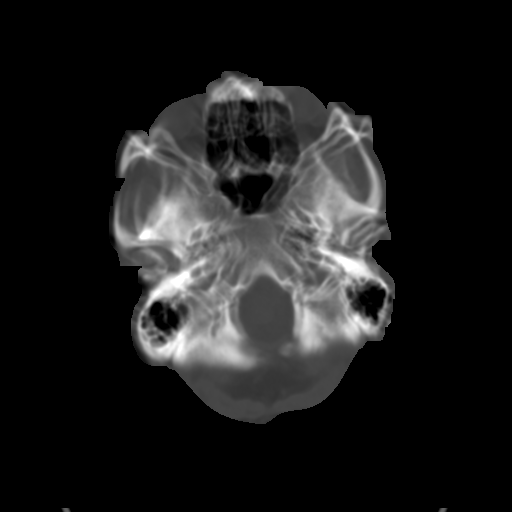
[im 9/33  brain]
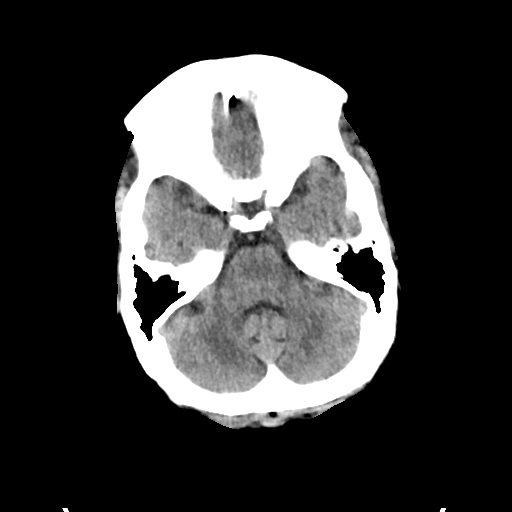
[im 13/33  brain]
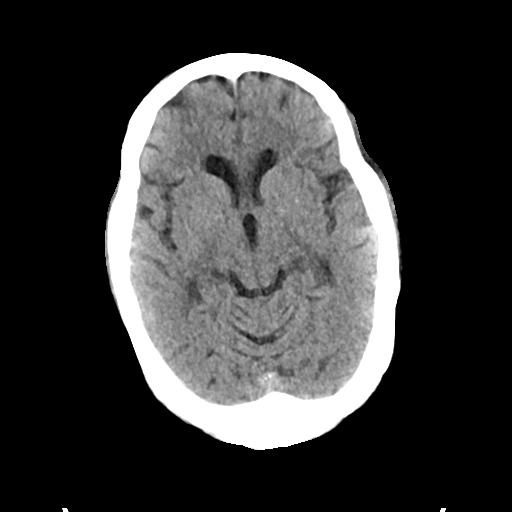
[im 17/33  brain]
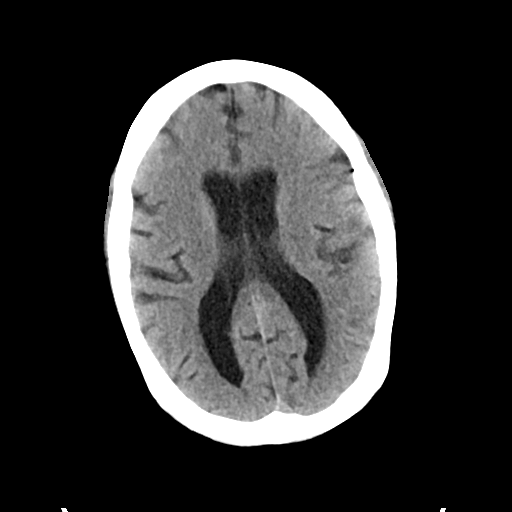
[im 21/33  brain]
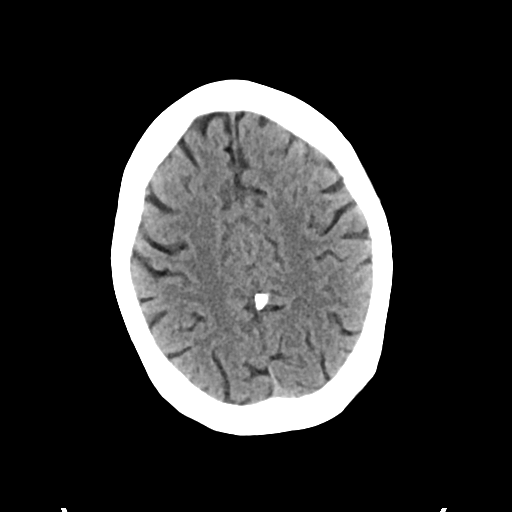
[im 21/33  bone]
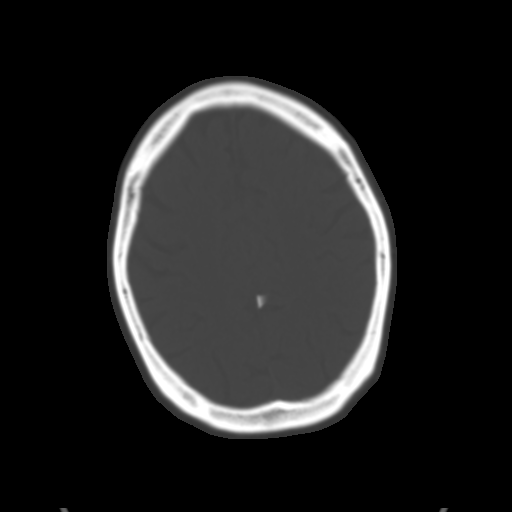
[im 25/33  brain]
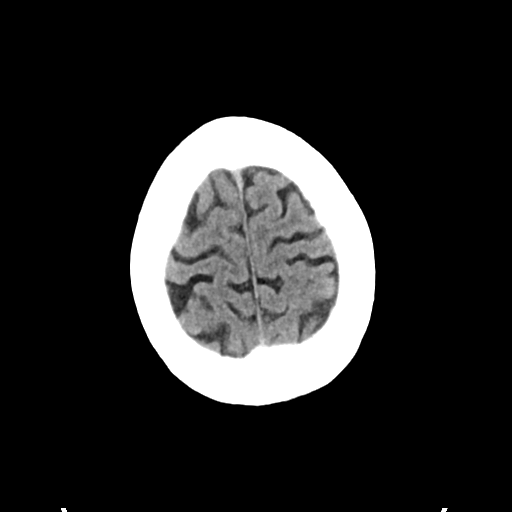
[im 29/33  brain]
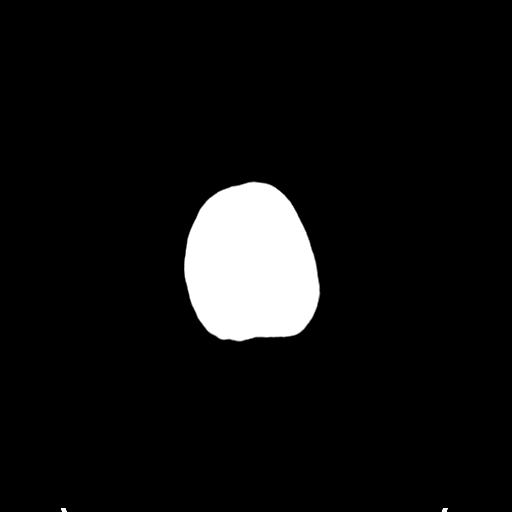

[Series 4: head bone · axial · 0.46mm/px · z∈[-167,-135]mm · 3 of 83 slices shown]
[im 9/83  bone]
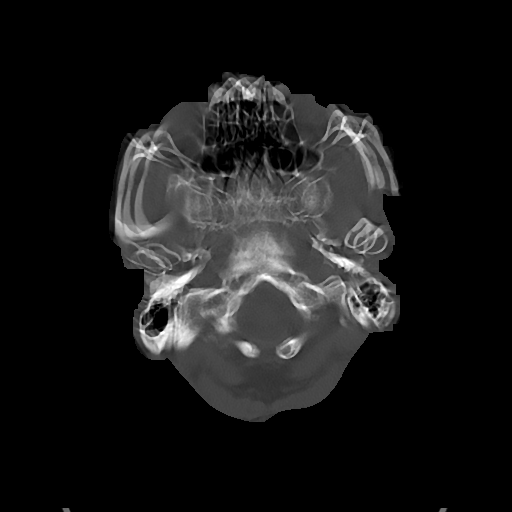
[im 17/83  bone]
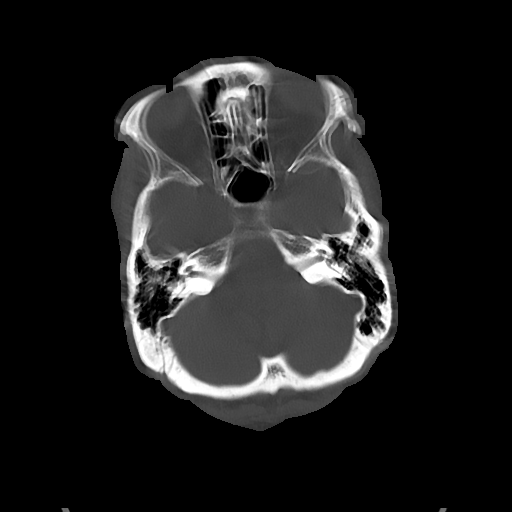
[im 25/83  bone]
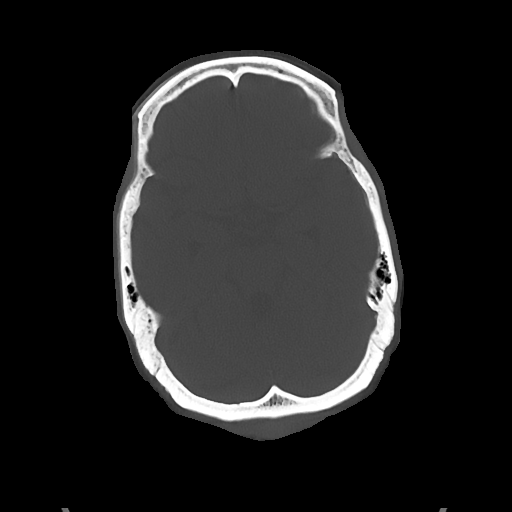

[Series 5: cor soft · coronal · 0.32mm/px · 3 of 67 slices shown]
[im 23/67  brain]
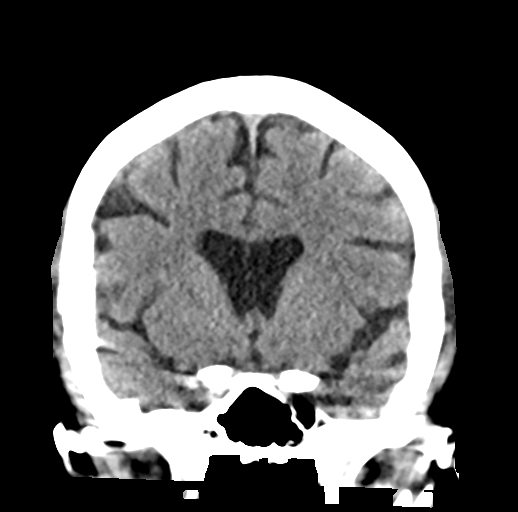
[im 30/67  brain]
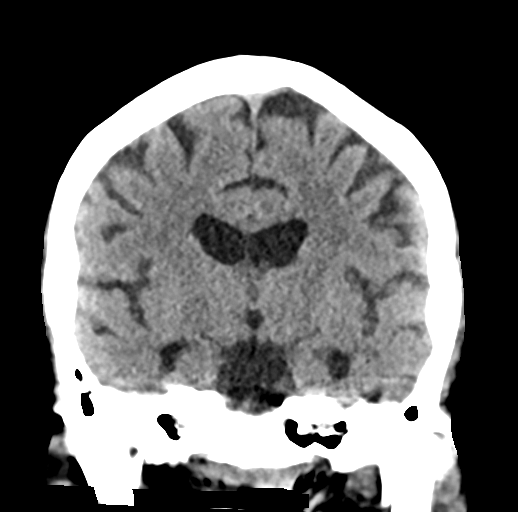
[im 37/67  brain]
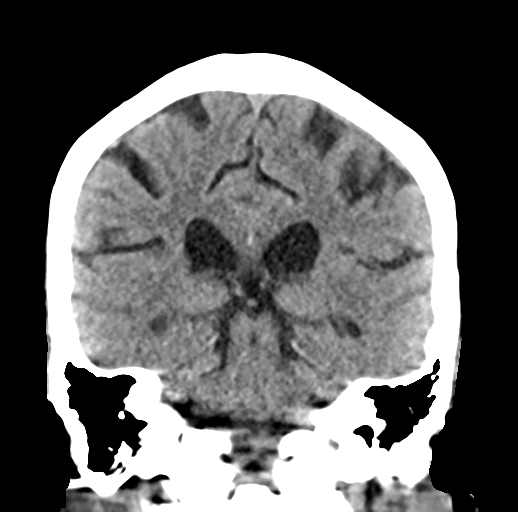

[Series 6: sag soft · sagittal · 0.32mm/px · 3 of 56 slices shown]
[im 19/56  brain]
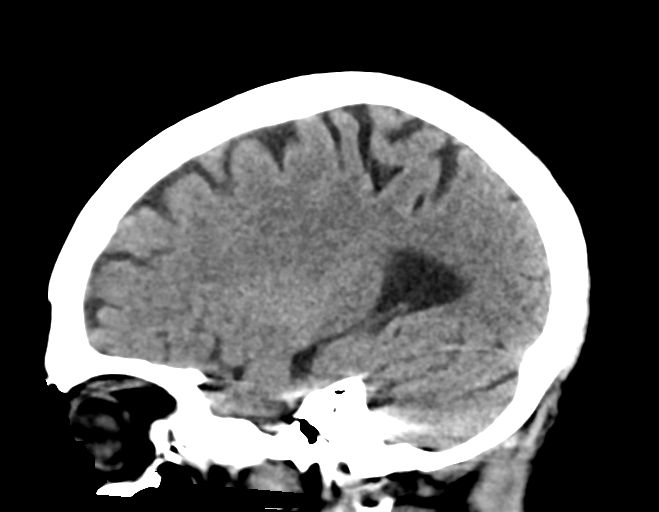
[im 28/56  brain]
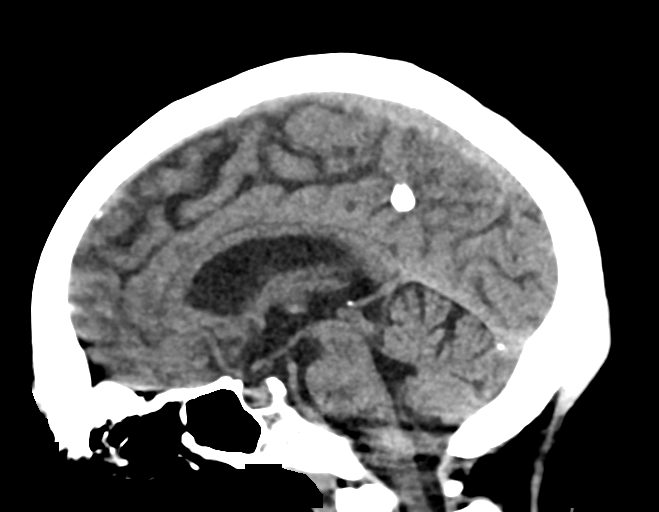
[im 37/56  brain]
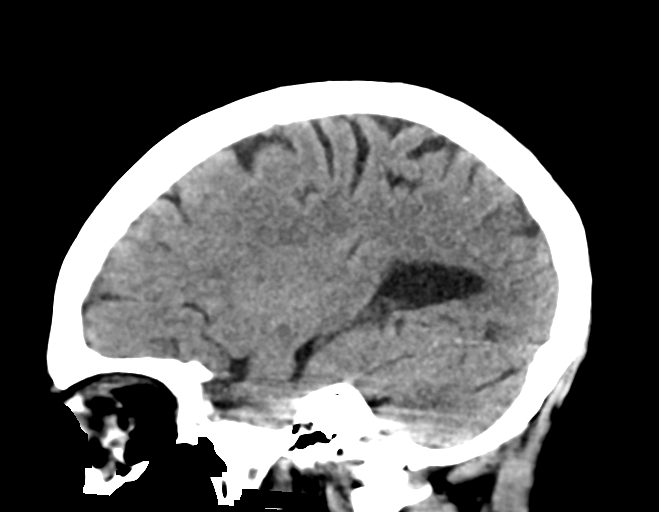

[16 of 47 positions shown; findings below may reference images not displayed]

FINDINGS: Skull base is degraded by motion artifact.

Brain: Cerebral volume seems decreased for age in a generalized
fashion.

No midline shift, ventriculomegaly, mass effect, evidence of mass
lesion, intracranial hemorrhage or evidence of cortically based
acute infarction. Gray-white matter differentiation is within normal
limits throughout the brain. No cortical encephalomalacia
identified.

Vascular: No suspicious intracranial vascular hyperdensity.

Skull: No acute osseous abnormality identified.

Sinuses/Orbits: Visualized paranasal sinuses and mastoids are well
pneumatized.

Other: Orbits are obscured. No definite acute scalp soft tissue
finding.
IMPRESSION: 1. No acute intracranial abnormality.
2. Cerebral volume is decreased for age.

## 2020-03-03 ENCOUNTER — Ambulatory Visit: Payer: Medicaid Other

## 2020-03-03 ENCOUNTER — Other Ambulatory Visit: Payer: Self-pay

## 2020-03-03 ENCOUNTER — Ambulatory Visit: Payer: Medicaid Other | Admitting: Speech Pathology

## 2020-03-03 DIAGNOSIS — E512 Wernicke's encephalopathy: Secondary | ICD-10-CM

## 2020-03-03 DIAGNOSIS — M6281 Muscle weakness (generalized): Secondary | ICD-10-CM | POA: Diagnosis not present

## 2020-03-03 DIAGNOSIS — R41841 Cognitive communication deficit: Secondary | ICD-10-CM

## 2020-03-03 NOTE — Therapy (Signed)
[facility] [address] [place], [place], [address] Phone: [phone]   Fax:  [phone]  Speech Language Pathology Treatment  Patient Details  Name: Joann [name] MRN: [id number] Date of Birth: [birth year] Referring Provider (SLP): Dr. Alger Simons   Encounter Date: 03/03/2020   End of Session - 03/03/20 0957    Visit Number 8    Number of Visits 17    Date for SLP Re-Evaluation 03/14/20    Authorization Type medicaid - Pt receiving PT and OT evals - PT requesting 12, I will request 10    Authorization - Visit Number 6    Authorization - Number of Visits 10    SLP Start Time 0941   11 min late   SLP Stop Time  1015    SLP Time Calculation (min) 34 min    Activity Tolerance Patient tolerated treatment well           Past Medical History:  Diagnosis Date  . Alcohol abuse   . Anxiety   . Hypertension   . Liver disease     Past Surgical History:  Procedure Laterality Date  . ECTOPIC PREGNANCY SURGERY    . ESOPHAGOGASTRODUODENOSCOPY (EGD) WITH PROPOFOL N/A 12/27/2019   Procedure: ESOPHAGOGASTRODUODENOSCOPY (EGD) WITH PROPOFOL;  Surgeon: Carol Ada, MD;  Location: [facility];  Service: Endoscopy;  Laterality: N/A;    There were no vitals filed for this visit.   Subjective Assessment - 03/03/20 0943    Subjective "I'm sorry I'm late."    Currently in Pain? No/denies                 ADULT SLP TREATMENT - 03/03/20 0946      General Information   Behavior/Cognition Alert;Cooperative;Distractible;Pleasant mood      Pain Assessment   Pain Assessment No/denies pain      Cognitive-Linquistic Treatment   Treatment focused on Cognition;Patient/family/caregiver education    Skilled Treatment Pt continues to complete HW assignments; work checked today was accurate. Continues to use her binder and compensations to assist with completing 5 + chores/IADL's daily. Patient reported success with grocery  shopping using strategy of organization by category. She reports taking breaks in her room when frustrated with her sons, but has difficulty relaxing during this time. SLP trained pt in diaphragmatic breathing exercise and pt reported this helped her feel more relaxed; instructions provided in handout. We generated additional strategies today for ways she can compensate for decreased processing, attention and memory as she is hoping to return to work.       Assessment / Recommendations / Plan   Plan Continue with current plan of care      Progression Toward Goals   Progression toward goals Progressing toward goals            SLP Education - 03/03/20 1345    Education Details abdominal breathing    Person(s) Educated Patient    Methods Explanation;Demonstration;Verbal cues    Comprehension Verbalized understanding;Returned demonstration            SLP Short Term Goals - 03/03/20 1346      SLP SHORT TERM GOAL #1   Title Pt will use to -do list to complete 3 tasks/chores a day over 2 sessions with rare min A from family    Baseline Pt is not doing chores/tasks and not using reminders    Period Weeks    Status Achieved      SLP SHORT TERM GOAL #2  Title Pt will verbalize emergent awareness of 3 cogntve impairments with rare min A over 3 sessions    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    Time 1    Period Weeks    Status On-going      SLP SHORT TERM GOAL #3   Title Pt will meal plan for 1 week including grocery list with occasional min A from family    Baseline Pt is not currently doing this, it was her job PTA    Time 1    Period Weeks    Status On-going            SLP Long Term Goals - 03/03/20 1346      SLP LONG TERM GOAL #1   Title Pt will use external aids to organize and manage medications accurately over 2 sessions    Baseline daughter is managing her meds    Time 4    Period Weeks    Status On-going      SLP LONG TERM GOAL #2   Title  Pt will utilize compensations for attention, memory, safety to complete light meal prep with rare min A from family over 2 sessions    Baseline This was pt's job PTA, currently not doing    Time 4    Period Weeks    Status On-going      SLP LONG TERM GOAL #3   Title Pt will be intelligible and speak at 70-74dB in noisy environment with rare min A    Baseline sub 70dB, reduced intelligibility reported by spouse    Time 4    Period Weeks    Status On-going            Plan - 03/03/20 1345    Clinical Impression Statement Ongoing training in compensatory strategies for memory and attention impairments.She is using strategies to successfully manage her medications independently at home and has cooked some family meals. Ongoing use of journal to recall daily events. Continue skilled ST to maximize cognitive linguistic skills for eventual return to work. Pt/spouse goal for return to work is Late July or August.    Speech Therapy Frequency 2x / week    Duration --   8 weeks sor 17 visits   Treatment/Interventions Language facilitation;Environmental controls;Cueing hierarchy;SLP instruction and feedback;Compensatory strategies;Functional tasks;Cognitive reorganization;Compensatory techniques;Patient/family education;Multimodal communcation approach;Internal/external aids    Potential to Achieve Goals Good    Potential Considerations Ability to learn/carryover information           Patient will benefit from skilled therapeutic intervention in order to improve the following deficits and impairments:   Cognitive communication deficit  Wernicke's encephalopathy    Problem List Patient Active Problem List   Diagnosis Date Noted  . Reactive depression   . Wernicke's encephalopathy   . Transaminitis   . Steroid-induced hyperglycemia   . Leucocytosis   . Orthostasis   . Macrocytic anemia   . AMS (altered mental status)   . Sleep disturbance   . Pressure injury of skin 01/05/2020  .  Alcoholic hepatitis with ascites   . Acute respiratory failure ([place])   . Palliative care encounter   . Elevated bilirubin   . Ascites   . Advanced care planning/counseling discussion   . Goals of care, counseling/discussion   . Palliative care by specialist   . Hypoalbuminemia   . Hepatic encephalopathy ([place]) 12/24/2019  . Alcoholic hepatitis without ascites   . Hypokalemia due to inadequate potassium intake 04/02/2019  .  Alcohol withdrawal ([place]) 10/24/2018  . Alcohol dependence with withdrawal, unspecified ([place]) 10/24/2018  . Elevated LFTs 10/24/2018  . Thrombocytopenia ([place]) 10/24/2018  . Alcohol-induced acute pancreatitis   . Acute blood loss anemia   . Alcoholic ketoacidosis 23/20/0941  . Alcohol abuse with alcohol-induced mood disorder ([place]) 09/14/2017  . Elevated liver enzymes 04/29/2017   Deneise Lever, [place], [place] 03/03/2020, 1:47 PM  [facility] [address] [place], [place], [address] Phone: [phone]   Fax:  [phone]   Name: MARCELL PFEIFER MRN: [id number] Date of Birth: 12-30-1980

## 2020-03-03 NOTE — Patient Instructions (Addendum)
Great job recognizing when you're getting overstimulated and taking a break. If you're having trouble relaxing, you can try a simple breathing activity to help you take your mind off of what's going on around you.   Abdominal Breathing  . Shoulders down - this is a cue to relax . Place your hand on your abdomen - this helps you focus on easy abdominal breath support - the best and most relaxed way to breathe . Breathe in through your nose and fill your belly with air, watching your hand move outward . Breathe out through your mouth and watch your belly move in. An audible "sh"  may help   Think of your belly as a balloon, when you fill with air (inhale), the balloon gets bigger. As the air goes out (exhale), the balloon deflates.  If you are having difficulty coordinating this, lay on your back with a plastic cup on your belly and repeat the above steps, watching you belly move up with inhalation and down with exhalations  Practice breathing in and out in front of a mirror, watching your belly Breathe in for a count of 5 and breathe out for a count of 5  Work strategies:  - Use your notebook to take notes when you start a new contract: names of the people you report to, names and numbers of people you can reach out to for help, details about how the facility operates. -Notes about the documentation system, any differences in policy or procedure.  -Think about making a checklist for yourself (POC documentation, doctor's progress note, etc) and refer to this as you complete your charting -Take notes of times/tasks you're doing at work, and if you get interrupted, write down what you did before, so that you can remember to pick up where you left off.

## 2020-03-07 ENCOUNTER — Other Ambulatory Visit: Payer: Self-pay

## 2020-03-07 ENCOUNTER — Ambulatory Visit: Payer: Medicaid Other

## 2020-03-07 ENCOUNTER — Encounter: Payer: Self-pay | Admitting: Speech Pathology

## 2020-03-07 ENCOUNTER — Ambulatory Visit: Payer: Medicaid Other | Admitting: Speech Pathology

## 2020-03-07 ENCOUNTER — Encounter: Payer: Medicaid Other | Admitting: Occupational Therapy

## 2020-03-07 DIAGNOSIS — R471 Dysarthria and anarthria: Secondary | ICD-10-CM

## 2020-03-07 DIAGNOSIS — R41841 Cognitive communication deficit: Secondary | ICD-10-CM

## 2020-03-07 DIAGNOSIS — M6281 Muscle weakness (generalized): Secondary | ICD-10-CM | POA: Diagnosis not present

## 2020-03-07 NOTE — Patient Instructions (Signed)
  When you are asked to repeat yourself, take a big breath and think shout!  Loud AH! 5x twice a day - effort 8/10 but not throat strain  Read aloud 5 minutes twice a day focusing on volume - thing project into the next room   Make effort to be loud  Triple check the patients name on the chart when you are charting

## 2020-03-08 NOTE — Therapy (Signed)
[facility] [address] [place], [place], [address] Phone: [phone]   Fax:  [phone]  Speech Language Pathology Treatment  Patient Details  Name: Joann [name] MRN: [id number] Date of Birth: 06-Jan-1981 Referring Provider (SLP): Dr. Alger Simons   Encounter Date: 03/07/2020   End of Session - 03/08/20 1258    Visit Number 9    Number of Visits 17    Date for SLP Re-Evaluation 03/14/20    Authorization - Visit Number 7    Authorization - Number of Visits 10    SLP Start Time 5732    SLP Stop Time  0929    SLP Time Calculation (min) 42 min    Activity Tolerance Patient tolerated treatment well           Past Medical History:  Diagnosis Date  . Alcohol abuse   . Anxiety   . Hypertension   . Liver disease     Past Surgical History:  Procedure Laterality Date  . ECTOPIC PREGNANCY SURGERY    . ESOPHAGOGASTRODUODENOSCOPY (EGD) WITH PROPOFOL N/A 12/27/2019   Procedure: ESOPHAGOGASTRODUODENOSCOPY (EGD) WITH PROPOFOL;  Surgeon: Carol Ada, MD;  Location: [facility];  Service: Endoscopy;  Laterality: N/A;    There were no vitals filed for this visit.   Subjective Assessment - 03/07/20 0853    Subjective "I have bad news, my daughters fiancee in is a deep  coma"    Patient is accompained by: Family member   spouse   Currently in Pain? No/denies                 ADULT SLP TREATMENT - 03/07/20 0857      General Information   Behavior/Cognition Alert;Cooperative;Distractible;Pleasant mood      Treatment Provided   Treatment provided Cognitive-Linquistic      Cognitive-Linquistic Treatment   Treatment focused on Cognition;Patient/family/caregiver education    Skilled Treatment Pt reviews her strategies for attention at work - we added for her to triple check name on the EMR. Volume remains sub WNL - trained pt in HEP for dysarthria. With frequent cues and modeling she increased loud /a/  form  72dB average to 90db average with ongoing cues for breath support. Oral reaidng 10+ word sentences averaged 76dB focusing on volume.. Transitioned to generating sentences in structured taks, volume averaged 70dB with frequent mod visual and verbal cues for breath support and volume      Assessment / Recommendations / Plan   Plan Continue with current plan of care      Progression Toward Goals   Progression toward goals Progressing toward goals            SLP Education - 03/08/20 1256    Education Details HEP for dysarthira, compensations for dysarthira    Person(s) Educated Patient    Methods Explanation;Demonstration;Verbal cues    Comprehension Verbalized understanding;Returned demonstration;Verbal cues required            SLP Short Term Goals - 03/08/20 1257      SLP SHORT TERM GOAL #1   Title Pt will use to -do list to complete 3 tasks/chores a day over 2 sessions with rare min A from family    Baseline Pt is not doing chores/tasks and not using reminders    Period Weeks    Status Achieved      SLP SHORT TERM GOAL #2   Title Pt will verbalize emergent awareness of 3 cogntve impairments with rare min A over  3 sessions    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    Time 1    Period Weeks    Status On-going      SLP SHORT TERM GOAL #3   Title Pt will meal plan for 1 week including grocery list with occasional min A from family    Baseline Pt is not currently doing this, it was her job PTA    Time 1    Period Weeks    Status On-going            SLP Long Term Goals - 03/08/20 1258      SLP LONG TERM GOAL #1   Title Pt will use external aids to organize and manage medications accurately over 2 sessions    Baseline daughter is managing her meds    Time 3    Period Weeks    Status On-going      SLP LONG TERM GOAL #2   Title Pt will utilize compensations for attention, memory, safety to complete light meal prep with rare min A from family over 2  sessions    Baseline This was pt's job PTA, currently not doing    Time 3    Period Weeks    Status On-going      SLP LONG TERM GOAL #3   Title Pt will be intelligible and speak at 70-74dB in noisy environment with rare min A    Baseline sub 70dB, reduced intelligibility reported by spouse    Time 3    Period Weeks    Status On-going            Plan - 03/08/20 1257    Clinical Impression Statement Ongoing training in compensatory strategies for memory and attention impairments.She is using strategies to successfully manage her medications independently at home and has cooked some family meals. Ongoing use of journal to recall daily events. Continue skilled ST to maximize cognitive linguistic skills for eventual return to work. Pt/spouse goal for return to work is Late July or August.    Speech Therapy Frequency 2x / week    Duration --   8 weeks or 17 visits          Patient will benefit from skilled therapeutic intervention in order to improve the following deficits and impairments:   Cognitive communication deficit  Dysarthria and anarthria    Problem List Patient Active Problem List   Diagnosis Date Noted  . Reactive depression   . Wernicke's encephalopathy   . Transaminitis   . Steroid-induced hyperglycemia   . Leucocytosis   . Orthostasis   . Macrocytic anemia   . AMS (altered mental status)   . Sleep disturbance   . Pressure injury of skin 01/05/2020  . Alcoholic hepatitis with ascites   . Acute respiratory failure ([facility])   . Palliative care encounter   . Elevated bilirubin   . Ascites   . Advanced care planning/counseling discussion   . Goals of care, counseling/discussion   . Palliative care by specialist   . Hypoalbuminemia   . Hepatic encephalopathy ([place]) 12/24/2019  . Alcoholic hepatitis without ascites   . Hypokalemia due to inadequate potassium intake 04/02/2019  . Alcohol withdrawal ([facility]) 10/24/2018  . Alcohol dependence with withdrawal,  unspecified ([facility]) 10/24/2018  . Elevated LFTs 10/24/2018  . Thrombocytopenia ([place]) 10/24/2018  . Alcohol-induced acute pancreatitis   . Acute blood loss anemia   . Alcoholic ketoacidosis 54/49/2010  . Alcohol abuse with alcohol-induced mood disorder (  [place]) 09/14/2017  . Elevated liver enzymes 04/29/2017    Vidyuth Belsito, Annye Rusk MS, CCC-SLP 03/08/2020, 1:00 PM  [facility] [address] [place], [place], [address] Phone: [phone]   Fax:  [phone]   Name: Joann [name] MRN: [id number] Date of Birth: [birth year]

## 2020-03-10 ENCOUNTER — Encounter: Payer: Medicaid Other | Admitting: Speech Pathology

## 2020-03-10 ENCOUNTER — Ambulatory Visit: Payer: Medicaid Other | Admitting: Speech Pathology

## 2020-03-10 ENCOUNTER — Encounter: Payer: Medicaid Other | Admitting: Occupational Therapy

## 2020-03-10 ENCOUNTER — Ambulatory Visit: Payer: Medicaid Other

## 2020-03-15 ENCOUNTER — Ambulatory Visit: Payer: Medicaid Other | Attending: Physician Assistant

## 2020-03-15 ENCOUNTER — Other Ambulatory Visit: Payer: Self-pay

## 2020-03-15 DIAGNOSIS — Z3A22 22 weeks gestation of pregnancy: Secondary | ICD-10-CM | POA: Diagnosis present

## 2020-03-15 DIAGNOSIS — R2681 Unsteadiness on feet: Secondary | ICD-10-CM

## 2020-03-15 DIAGNOSIS — M6281 Muscle weakness (generalized): Secondary | ICD-10-CM | POA: Insufficient documentation

## 2020-03-15 NOTE — Therapy (Signed)
[facility] [address] [place], [place], [address] Phone: [phone]   Fax:  [phone]  Physical Therapy Treatment  Patient Details  Name: Joann [name] MRN: [id number] Date of Birth: 1980-12-06 Referring Provider (PT): Dr. Leeroy Cha   Encounter Date: 03/15/2020   PT End of Session - 03/15/20 0905    Visit Number 6    Number of Visits 19    Date for PT Re-Evaluation 03/29/20    Authorization Type Medicaid    Authorization Time Period 6 sessions from 02/22/20 to 04/03/20    Authorization - Visit Number 2    Authorization - Number of Visits 6    Progress Note Due on Visit 10    PT Start Time 0850    PT Stop Time 0930    PT Time Calculation (min) 40 min    Activity Tolerance Patient tolerated treatment well    Behavior During Therapy [facility] for tasks assessed/performed           Past Medical History:  Diagnosis Date   Alcohol abuse    Anxiety    Hypertension    Liver disease     Past Surgical History:  Procedure Laterality Date   ECTOPIC PREGNANCY SURGERY     ESOPHAGOGASTRODUODENOSCOPY (EGD) WITH PROPOFOL N/A 12/27/2019   Procedure: ESOPHAGOGASTRODUODENOSCOPY (EGD) WITH PROPOFOL;  Surgeon: Carol Ada, MD;  Location: [facility];  Service: Endoscopy;  Laterality: N/A;    There were no vitals filed for this visit.   Subjective Assessment - 03/15/20 0900    Subjective Pt reports she gets intermittent cramps in her calves and hamstrings, usually in the morning. Pt reports she gets them more if she worked her legs with activities at home in previous day.    Patient is accompained by: Family member    Pertinent History hepatic encephalopathy, Wernicke's encephalopathy    Limitations Standing;Walking;House hold activities;Lifting    How long can you sit comfortably? no issues    How long can you stand comfortably? 5 min    How long can you walk comfortably? 15-20 min with support of cart     Patient Stated Goals "get stronger"    Currently in Pain? No/denies                Access Code: [facility] URL: [email or web address] Date: 03/15/2020 Prepared by: Markus Jarvis  Exercises Seated Hamstring Stretch - 1 x daily - 7 x weekly - 1 sets - 3 reps - 30 hold Standing Gastroc Stretch on Foam 1/2 Roll - 1 x daily - 7 x weekly - 1 sets - 3 reps - 30 hold Standing Heel Raise with Support - 1 x daily - 7 x weekly - 3 sets - 10 reps Seated Hamstring Curl with Anchored Resistance - 1 x daily - 7 x weekly - 3 sets - 10 reps Standing Hip Flexion with Resistance Loop - 1 x daily - 7 x weekly - 2 sets - 10 reps Hip Abduction with Resistance Loop - 1 x daily - 7 x weekly - 2 sets - 10 reps Hip Extension with Resistance Loop - 1 x daily - 7 x weekly - 2 sets - 10 reps Sit to Stand without Arm Support - 1 x daily - 7 x weekly - 2 sets - 10 reps                      PT Short Term Goals - 02/15/20 1010  PT SHORT TERM GOAL #1   Title Patient will be able to stand for 15 min to be able to cook a small meal for her self.    Baseline 5 min (eval); 30 min (02/09/20)    Time 3    Period Weeks    Status Achieved    Target Date 02/08/20      PT SHORT TERM GOAL #2   Title Pt will be able to ambulate for 30 min of continous walk to improve walking endurance to improve community ambulation    Baseline 15-20 min (eval); 15-20 min (02/09/20)    Time 3    Period Weeks    Status Achieved    Target Date 02/08/20      PT SHORT TERM GOAL #3   Title Pt will be able to carry 5 lb for 115' to assist with getting grocery bags from car to home    Baseline not attempted (Eval); met (able to carry light objects)    Time 3    Period Weeks    Status Achieved    Target Date 02/08/20      PT SHORT TERM GOAL #4   Title Pt will be able to perform 5 x unilateral heel raises with bil HHA to improve strength of bil plantarflexors    Baseline unable, 10x (02/09/20)    Time 3     Period Weeks    Status Achieved    Target Date 02/08/20      PT SHORT TERM GOAL #5   Title Pt will be able to perform 1x sit to stand without use of HHA to improve functional strength    Baseline unable to without HHA support (eval); 5x sit to stand time without UE support (13.32 sec)    Time 3    Period Weeks    Status Achieved      PT SHORT TERM GOAL #6   Title Pt will be able to get in and out of her husband's truck with SBA to improve functional transfers    Baseline min to mod A provided by husband, SBA (02/15/20)    Time 3    Period Weeks    Status Achieved    Target Date 02/08/20             PT Long Term Goals - 02/15/20 1006      PT LONG TERM GOAL #1   Title Pt will demo 56/56 on BBS to improve overall balance and reduce fall risk    Baseline 51/56, 02/09/20 56/56    Time 8    Period Weeks    Status Achieved    Target Date 03/14/20      PT LONG TERM GOAL #2   Title Pt will be able to perform 5x sit to stand without use of HHA in under 20 seconds to improve overall functional strength    Baseline 13.32 sec without UE support (02/09/20)    Time 8    Period Weeks    Status Achieved    Target Date 03/14/20      PT LONG TERM GOAL #3   Title Pt will be able to ambulate 1 mile in under 25 min to improve functional ambulation and to return to PLOF.    Baseline not attempted to walk a mile yet    Time 8    Period Weeks    Status New    Target Date 03/14/20      PT LONG TERM GOAL #  4   Title Pt will demo 5/5 strength in Bil LE to improve overall strength and transfers.    Baseline 3+-4/5 grossly    Time 8    Period Weeks    Status New    Target Date 12/14/19                 Plan - 03/15/20 0904    Clinical Impression Statement Today's session was focused on initiating final HEP program that we will be finalizing over next 2-3 sessions. Pt tolerated session well.    Personal Factors and Comorbidities Time since onset of  injury/illness/exacerbation;Transportation    Examination-Activity Limitations Caring for Others;Carry;Lift;Locomotion Level;Squat;Stairs;Stand;Transfers    Examination-Participation Restrictions Cleaning;Meal Prep;Shop;Yard Work;Laundry;Driving;Community Activity    Stability/Clinical Decision Making Evolving/Moderate complexity    Rehab Potential Good    PT Frequency 2x / week    PT Duration 8 weeks    PT Treatment/Interventions ADLs/Self Care Home Management;Gait training;Stair training;Functional mobility training;Therapeutic activities;Therapeutic exercise;Balance training;Neuromuscular re-education;Manual techniques;Patient/family education    PT Next Visit Plan once a week now for couple of sessions, review HEP and progress    Consulted and Agree with Plan of Care Patient           Patient will benefit from skilled therapeutic intervention in order to improve the following deficits and impairments:  Abnormal gait, Decreased balance, Decreased cognition, Decreased endurance, Decreased strength, Decreased mobility, Difficulty walking  Visit Diagnosis: Muscle weakness (generalized)  Unsteadiness on feet     Problem List Patient Active Problem List   Diagnosis Date Noted   Reactive depression    Wernicke's encephalopathy    Transaminitis    Steroid-induced hyperglycemia    Leucocytosis    Orthostasis    Macrocytic anemia    AMS (altered mental status)    Sleep disturbance    Pressure injury of skin 25/63/8937   Alcoholic hepatitis with ascites    Acute respiratory failure (HCC)    Palliative care encounter    Elevated bilirubin    Ascites    Advanced care planning/counseling discussion    Goals of care, counseling/discussion    Palliative care by specialist    Hypoalbuminemia    Hepatic encephalopathy ([place]) 34/28/7681   Alcoholic hepatitis without ascites    Hypokalemia due to inadequate potassium intake 04/02/2019   Alcohol withdrawal ([place])  10/24/2018   Alcohol dependence with withdrawal, unspecified ([place]) 10/24/2018   Elevated LFTs 10/24/2018   Thrombocytopenia ([place]) 10/24/2018   Alcohol-induced acute pancreatitis    Acute blood loss anemia    Alcoholic ketoacidosis 15/72/6203   Alcohol abuse with alcohol-induced mood disorder ([place]) 09/14/2017   Elevated liver enzymes 04/29/2017    Kerrie Pleasure, PT 03/15/2020, 9:07 AM  [facility] [address] [place], [place], [address] Phone: [phone]   Fax:  [phone]  Name: Joann [name] MRN: [id number] Date of Birth: [birth year]

## 2020-03-22 ENCOUNTER — Other Ambulatory Visit: Payer: Self-pay

## 2020-03-22 ENCOUNTER — Ambulatory Visit: Payer: Medicaid Other

## 2020-03-22 DIAGNOSIS — M6281 Muscle weakness (generalized): Secondary | ICD-10-CM

## 2020-03-22 DIAGNOSIS — R2681 Unsteadiness on feet: Secondary | ICD-10-CM

## 2020-03-22 DIAGNOSIS — Z3A22 22 weeks gestation of pregnancy: Secondary | ICD-10-CM | POA: Insufficient documentation

## 2020-03-22 HISTORY — DX: 22 weeks gestation of pregnancy: Z3A.22

## 2020-03-22 NOTE — Therapy (Signed)
[facility] [address] [place], [place], [address] Phone: [phone]   Fax:  [phone]  Physical Therapy Discharge Summary  Patient Details  Name: Joann [name] MRN: [id number] Date of Birth: 1981/09/01 Referring Provider (PT): Dr. Leeroy Cha   Encounter Date: 03/22/2020   PT End of Session - 03/22/20 1042    Visit Number 7    Number of Visits 19    Date for PT Re-Evaluation 03/29/20    Authorization Type Medicaid    Authorization Time Period 6 sessions from 02/22/20 to 04/03/20    Authorization - Visit Number 3    Authorization - Number of Visits 6    Progress Note Due on Visit 10    PT Start Time 1020    PT Stop Time 1050    PT Time Calculation (min) 30 min    Equipment Utilized During Treatment Gait belt    Activity Tolerance Patient tolerated treatment well    Behavior During Therapy [facility] for tasks assessed/performed           Past Medical History:  Diagnosis Date  . Alcohol abuse   . Anxiety   . Hypertension   . Liver disease     Past Surgical History:  Procedure Laterality Date  . ECTOPIC PREGNANCY SURGERY    . ESOPHAGOGASTRODUODENOSCOPY (EGD) WITH PROPOFOL N/A 12/27/2019   Procedure: ESOPHAGOGASTRODUODENOSCOPY (EGD) WITH PROPOFOL;  Surgeon: Carol Ada, MD;  Location: [facility];  Service: Endoscopy;  Laterality: N/A;    There were no vitals filed for this visit.   Subjective Assessment - 03/22/20 1037    Subjective I have only done HEP 2x since last session. I am busy taking care of my household and my daughter's household since her fiance is in hospital. I was able to walk 1 mile in 30 min. 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    Patient is accompained by: Family member    Pertinent History hepatic encephalopathy, Wernicke's encephalopathy    Limitations Standing;Walking;House hold activities;Lifting    How long can you sit  comfortably? no issues    How long can you stand comfortably? 5 min    How long can you walk comfortably? 15-20 min with support of cart    Patient Stated Goals "get stronger"    Currently in Pain? No/denies               Pt educated on continue to work on improving her walking endurance and being compliant with below HEP for 2-3/week.   Following exercises reviewed as final HEP  Access Code: [facility] URL: [email or web address] Date: 03/15/2020 Prepared by: Markus Jarvis  Exercises Seated Hamstring Stretch - 1 x daily - 7 x weekly - 1 sets - 3 reps - 30 hold Standing Gastroc Stretch on Foam 1/2 Roll - 1 x daily - 7 x weekly - 1 sets - 3 reps - 30 hold Standing Heel Raise with Support - 1 x daily - 7 x weekly - 3 sets - 10 reps Seated Hamstring Curl with Anchored Resistance - 1 x daily - 7 x weekly - 3 sets - 10 reps Standing Hip Flexion with Resistance Loop - 1 x daily - 7 x weekly - 2 sets - 10 reps Hip Abduction with Resistance Loop - 1 x daily - 7 x weekly - 2 sets - 10 reps Hip Extension with Resistance Loop - 1 x daily -  7 x weekly - 2 sets - 10 reps Sit to Stand without Arm Support - 1 x daily - 7 x weekly - 2 sets - 10 reps                         PT Education - 03/22/20 1047    Education Details Final HEP issued    Person(s) Educated Patient    Methods Explanation;Demonstration    Comprehension Verbalized understanding;Returned demonstration            PT Short Term Goals - 02/15/20 1010      PT SHORT TERM GOAL #1   Title Patient will be able to stand for 15 min to be able to cook a small meal for her self.    Baseline 5 min (eval); 30 min (02/09/20)    Time 3    Period Weeks    Status Achieved    Target Date 02/08/20      PT SHORT TERM GOAL #2   Title Pt will be able to ambulate for 30 min of continous walk to improve walking endurance to improve community ambulation    Baseline 15-20 min (eval); 15-20 min (02/09/20)     Time 3    Period Weeks    Status Achieved    Target Date 02/08/20      PT SHORT TERM GOAL #3   Title Pt will be able to carry 5 lb for 115' to assist with getting grocery bags from car to home    Baseline not attempted (Eval); met (able to carry light objects)    Time 3    Period Weeks    Status Achieved    Target Date 02/08/20      PT SHORT TERM GOAL #4   Title Pt will be able to perform 5 x unilateral heel raises with bil HHA to improve strength of bil plantarflexors    Baseline unable, 10x (02/09/20)    Time 3    Period Weeks    Status Achieved    Target Date 02/08/20      PT SHORT TERM GOAL #5   Title Pt will be able to perform 1x sit to stand without use of HHA to improve functional strength    Baseline unable to without HHA support (eval); 5x sit to stand time without UE support (13.32 sec)    Time 3    Period Weeks    Status Achieved      PT SHORT TERM GOAL #6   Title Pt will be able to get in and out of her husband's truck with SBA to improve functional transfers    Baseline min to mod A provided by husband, SBA (02/15/20)    Time 3    Period Weeks    Status Achieved    Target Date 02/08/20             PT Long Term Goals - 03/22/20 1038      PT LONG TERM GOAL #1   Title Pt will demo 56/56 on BBS to improve overall balance and reduce fall risk    Baseline 51/56, 02/09/20 56/56    Time 8    Period Weeks    Status Achieved      PT LONG TERM GOAL #2   Title Pt will be able to perform 5x sit to stand without use of HHA in under 20 seconds to improve overall functional strength    Baseline 13.[place]  sec without UE support (02/09/20)    Time 8    Period Weeks    Status Achieved      PT LONG TERM GOAL #3   Title Pt will be able to ambulate 1 mile in under 25 min to improve functional ambulation and to return to PLOF.    Baseline 1 mile in 30 min    Time 8    Period Weeks    Status Partially Met      PT LONG TERM GOAL #4   Title Pt will demo 5/5 strength in Bil LE  to improve overall strength and transfers.    Baseline 5/5 03/22/20    Time 8    Period Weeks    Status Achieved                 Plan - 03/22/20 1044    Clinical Impression Statement Patient has been seen for total of 7 sessions from 01/18/20 to 03/22/20 for generalized weakness, gait and balane impairments. Patient has progressed well and has met all of her short term and long term goals. Patient is compliant with her home exercise program and doesn't require skilled PT at this time. patient will be discharged from skilled physical therapy.    Examination-Participation Restrictions Cleaning;Meal Prep;Shop;Yard Work;Laundry;Driving;Community Activity    Stability/Clinical Decision Making Evolving/Moderate complexity    Clinical Decision Making Moderate    Rehab Potential Good    PT Treatment/Interventions Patient/family education    PT Next Visit Plan Discharge    Consulted and Agree with Plan of Care Patient           Patient will benefit from skilled therapeutic intervention in order to improve the following deficits and impairments:     Visit Diagnosis: Muscle weakness (generalized)  2323  Unsteadiness on feet     Problem List Patient Active Problem List   Diagnosis Date Noted  . 2323 03/22/2020  . Reactive depression   . Wernicke's encephalopathy   . Transaminitis   . Steroid-induced hyperglycemia   . Leucocytosis   . Orthostasis   . Macrocytic anemia   . AMS (altered mental status)   . Sleep disturbance   . Pressure injury of skin 01/05/2020  . Alcoholic hepatitis with ascites   . Acute respiratory failure ([place])   . Palliative care encounter   . Elevated bilirubin   . Ascites   . Advanced care planning/counseling discussion   . Goals of care, counseling/discussion   . Palliative care by specialist   . Hypoalbuminemia   . Hepatic encephalopathy ([place]) 12/24/2019  . Alcoholic hepatitis without ascites   . Hypokalemia due to inadequate potassium intake  04/02/2019  . Alcohol withdrawal ([place]) 10/24/2018  . Alcohol dependence with withdrawal, unspecified ([place]) 10/24/2018  . Elevated LFTs 10/24/2018  . Thrombocytopenia ([place]) 10/24/2018  . Alcohol-induced acute pancreatitis   . Acute blood loss anemia   . Alcoholic ketoacidosis 50/05/3817  . Alcohol abuse with alcohol-induced mood disorder ([place]) 09/14/2017  . Elevated liver enzymes 04/29/2017    Kerrie Pleasure, PT 03/22/2020, 10:51 AM  [facility] [address] [place], [place], [address] Phone: [phone]   Fax:  [phone]  Name: Joann [name] MRN: [id number] Date of Birth: [birth year]

## 2020-03-29 ENCOUNTER — Ambulatory Visit: Payer: Medicaid Other

## 2020-04-26 ENCOUNTER — Other Ambulatory Visit: Payer: Self-pay | Admitting: Nurse Practitioner

## 2020-04-26 DIAGNOSIS — R188 Other ascites: Secondary | ICD-10-CM

## 2020-04-29 ENCOUNTER — Other Ambulatory Visit: Payer: Medicaid Other

## 2020-05-04 ENCOUNTER — Ambulatory Visit
Admission: RE | Admit: 2020-05-04 | Discharge: 2020-05-04 | Disposition: A | Discharge status: Home / Self Care | Payer: Medicaid Other | Source: Ambulatory Visit | Attending: Nurse Practitioner | Admitting: Nurse Practitioner

## 2020-05-04 DIAGNOSIS — R188 Other ascites: Secondary | ICD-10-CM

## 2020-06-27 ENCOUNTER — Other Ambulatory Visit: Payer: Self-pay | Admitting: Nurse Practitioner

## 2020-06-27 DIAGNOSIS — K7402 Hepatic fibrosis, advanced fibrosis: Secondary | ICD-10-CM

## 2020-07-01 ENCOUNTER — Other Ambulatory Visit: Payer: Medicaid Other

## 2020-07-03 ENCOUNTER — Encounter (HOSPITAL_COMMUNITY): Payer: Self-pay | Admitting: Emergency Medicine

## 2020-07-03 ENCOUNTER — Other Ambulatory Visit: Payer: Self-pay

## 2020-07-03 ENCOUNTER — Emergency Department (HOSPITAL_COMMUNITY)
Admission: EM | Admit: 2020-07-03 | Discharge: 2020-07-03 | Disposition: A | Discharge status: Home / Self Care | Payer: Medicaid Other | Attending: Emergency Medicine | Admitting: Emergency Medicine

## 2020-07-03 DIAGNOSIS — R1011 Right upper quadrant pain: Secondary | ICD-10-CM | POA: Diagnosis not present

## 2020-07-03 DIAGNOSIS — F10129 Alcohol abuse with intoxication, unspecified: Secondary | ICD-10-CM | POA: Diagnosis not present

## 2020-07-03 DIAGNOSIS — I10 Essential (primary) hypertension: Secondary | ICD-10-CM | POA: Insufficient documentation

## 2020-07-03 DIAGNOSIS — Z91040 Latex allergy status: Secondary | ICD-10-CM | POA: Diagnosis not present

## 2020-07-03 DIAGNOSIS — R109 Unspecified abdominal pain: Secondary | ICD-10-CM | POA: Diagnosis present

## 2020-07-03 DIAGNOSIS — F10920 Alcohol use, unspecified with intoxication, uncomplicated: Secondary | ICD-10-CM

## 2020-07-03 LAB — CBC
HCT: 33.6 % — ABNORMAL LOW (ref 36.0–46.0)
Hemoglobin: 9.7 g/dL — ABNORMAL LOW (ref 12.0–15.0)
MCH: 22.7 pg — ABNORMAL LOW (ref 26.0–34.0)
MCHC: 28.9 g/dL — ABNORMAL LOW (ref 30.0–36.0)
MCV: 78.7 fL — ABNORMAL LOW (ref 80.0–100.0)
Platelets: 219 10*3/uL (ref 150–400)
RBC: 4.27 MIL/uL (ref 3.87–5.11)
RDW: 16.5 % — ABNORMAL HIGH (ref 11.5–15.5)
WBC: 4.8 10*3/uL (ref 4.0–10.5)
nRBC: 0 % (ref 0.0–0.2)

## 2020-07-03 LAB — COMPREHENSIVE METABOLIC PANEL
ALT: 29 U/L (ref 0–44)
AST: 82 U/L — ABNORMAL HIGH (ref 15–41)
Albumin: 3.4 g/dL — ABNORMAL LOW (ref 3.5–5.0)
Alkaline Phosphatase: 101 U/L (ref 38–126)
Anion gap: 12 (ref 5–15)
BUN: 11 mg/dL (ref 6–20)
CO2: 22 mmol/L (ref 22–32)
Calcium: 8.4 mg/dL — ABNORMAL LOW (ref 8.9–10.3)
Chloride: 109 mmol/L (ref 98–111)
Creatinine, Ser: 0.56 mg/dL (ref 0.44–1.00)
GFR, Estimated: >60 mL/min (ref >60)
Glucose, Bld: 103 mg/dL — ABNORMAL HIGH (ref 70–99)
Potassium: 3.3 mmol/L — ABNORMAL LOW (ref 3.5–5.1)
Sodium: 143 mmol/L (ref 135–145)
Total Bilirubin: 0.7 mg/dL (ref 0.3–1.2)
Total Protein: 7.7 g/dL (ref 6.5–8.1)

## 2020-07-03 LAB — ETHANOL: Alcohol, Ethyl (B): 331 mg/dL (ref <10)

## 2020-07-03 LAB — AMMONIA: Ammonia: 48 umol/L — ABNORMAL HIGH (ref 9–35)

## 2020-07-03 LAB — I-STAT BETA HCG BLOOD, ED (MC, WL, AP ONLY): I-stat hCG, quantitative: <5 m[IU]/mL (ref <5)

## 2020-07-03 LAB — SALICYLATE LEVEL: Salicylate Lvl: <7 mg/dL — ABNORMAL LOW (ref 7.0–30.0)

## 2020-07-03 LAB — CBG MONITORING, ED: Glucose-Capillary: 106 mg/dL — ABNORMAL HIGH (ref 70–99)

## 2020-07-03 LAB — ACETAMINOPHEN LEVEL: Acetaminophen (Tylenol), Serum: <10 ug/mL — ABNORMAL LOW (ref 10–30)

## 2020-07-03 LAB — LIPASE, BLOOD: Lipase: 28 U/L (ref 11–51)

## 2020-07-03 MED ORDER — ONDANSETRON 4 MG PO TBDP: 4.0000 mg | ORAL_TABLET | Freq: Three times a day (TID) | ORAL | 0 refills | Status: DC | PRN

## 2020-07-03 MED ORDER — ONDANSETRON HCL 4 MG/2ML IJ SOLN
4.0000 mg | Freq: Once | INTRAMUSCULAR | Status: DC
  Filled 2020-07-03: qty 2

## 2020-07-03 MED ORDER — FENTANYL CITRATE (PF) 100 MCG/2ML IJ SOLN
50.0000 ug | Freq: Once | INTRAMUSCULAR | Status: DC
  Filled 2020-07-03: qty 2

## 2020-07-03 NOTE — ED Triage Notes (Signed)
Pt reports Stage 4 Liver Disease- states she had a relapse and has been drinking ETOH x 3 days.  Reports R sided abd pain with nausea.

## 2020-07-03 NOTE — ED Notes (Signed)
Pt verbalized understanding of discharge paperwork, follow-up care and prescriptions. Discharged home with mother.

## 2020-07-03 NOTE — ED Provider Notes (Signed)
[place] EMERGENCY DEPARTMENT Provider Note   CSN: [id number] Arrival date & time: 07/03/20  1017     History Chief Complaint  Patient presents with  . Abdominal Pain    Joann [name] is a 39 y.o. female.  The history is provided by the patient and medical records.  Abdominal Pain  Joann [name] is a 39 y.o. female who presents to the Emergency Department complaining of abdominal pain. She presents the emergency department complaining of sharp right upper quadrant abdominal pain that began three days ago. She has a history of alcoholic liver disease and relapse on alcohol three days ago. Pain is sharp and constant nature. She has associated emesis with two episodes of yellow emesis daily. She is also experiencing diarrhea with three yellowish bowel movements daily. She denies any fevers. No significant shortness of breath. No dysuria. She lives with her husband but he has been traveling for work. She drinks vodka and reports that she has been drinking heavily for the last three days. She has been drinking today. She wants to get her liver checked out due to her relapse. She last drank about six months ago. She feels safe at home and denies any SI. She is compliant with her lactulose and multivitamin. She denies any additional medications.    Past Medical History:  Diagnosis Date  . Alcohol abuse   . Anxiety   . Hypertension   . Liver disease     Patient Active Problem List   Diagnosis Date Noted  . 2323 03/22/2020  . Reactive depression   . Wernicke's encephalopathy   . Transaminitis   . Steroid-induced hyperglycemia   . Leucocytosis   . Orthostasis   . Macrocytic anemia   . AMS (altered mental status)   . Sleep disturbance   . Pressure injury of skin 01/05/2020  . Alcoholic hepatitis with ascites   . Acute respiratory failure ([place])   . Palliative care encounter   . Elevated bilirubin   . Ascites   . Advanced care planning/counseling discussion   .  Goals of care, counseling/discussion   . Palliative care by specialist   . Hypoalbuminemia   . Hepatic encephalopathy ([place]) 12/24/2019  . Alcoholic hepatitis without ascites   . Hypokalemia due to inadequate potassium intake 04/02/2019  . Alcohol withdrawal ([place]) 10/24/2018  . Alcohol dependence with withdrawal, unspecified ([place]) 10/24/2018  . Elevated LFTs 10/24/2018  . Thrombocytopenia ([place]) 10/24/2018  . Alcohol-induced acute pancreatitis   . Acute blood loss anemia   . Alcoholic ketoacidosis 75/17/0017  . Alcohol abuse with alcohol-induced mood disorder ([place]) 09/14/2017  . Elevated liver enzymes 04/29/2017    Past Surgical History:  Procedure Laterality Date  . ECTOPIC PREGNANCY SURGERY    . ESOPHAGOGASTRODUODENOSCOPY (EGD) WITH PROPOFOL N/A 12/27/2019   Procedure: ESOPHAGOGASTRODUODENOSCOPY (EGD) WITH PROPOFOL;  Surgeon: Carol Ada, MD;  Location: [facility];  Service: Endoscopy;  Laterality: N/A;     OB History   No obstetric history on file.     Family History  Problem Relation Age of Onset  . Healthy Mother   . Alcohol abuse Mother        quit in her 57s  . Alcohol abuse Father   . Diabetes Father   . Healthy Brother   . Hypertension Maternal Grandmother   . Hypertension Maternal Grandfather   . Healthy Brother   . Healthy Brother     Social History   Tobacco Use  . Smoking status: Never Smoker  .  Smokeless tobacco: Never Used  Vaping Use  . Vaping Use: Never used  Substance Use Topics  . Alcohol use: Yes    Comment: "I just do it"  . Drug use: Never    Home Medications Prior to Admission medications   Medication Sig Start Date End Date Taking? Authorizing Provider  folic acid (FOLVITE) 1 MG tablet Take 1 tablet (1 mg total) by mouth daily. 01/14/20   Angiulli, Lavon Paganini, PA-C  lactulose (CHRONULAC) 10 GM/15ML solution Take 45 mLs (30 g total) by mouth every 8 (eight) hours. 02/01/20   Raulkar, Clide Deutscher, MD  magnesium gluconate (MAGONATE) 30 MG  tablet Take 1 tablet (30 mg total) by mouth 2 (two) times daily. 02/01/20   Raulkar, Clide Deutscher, MD  midodrine (PROAMATINE) 5 MG tablet Take 1 tablet (5 mg total) by mouth 3 (three) times daily with meals. 01/14/20   Angiulli, Lavon Paganini, PA-C  Multiple Vitamin (MULTIVITAMIN WITH MINERALS) TABS tablet Take 1 tablet by mouth daily. 01/15/20   Angiulli, Lavon Paganini, PA-C  ondansetron (ZOFRAN ODT) 4 MG disintegrating tablet Take 1 tablet (4 mg total) by mouth every 8 (eight) hours as needed for nausea or vomiting. 07/03/20   Quintella Reichert, MD  potassium chloride 20 MEQ TBCR Take 10 mEq by mouth daily. 01/16/20   [name], MD  prednisoLONE 5 MG TABS tablet Take 8 tablets (40 mg total) by mouth daily. 01/15/20   Angiulli, Lavon Paganini, PA-C  rifaximin (XIFAXAN) 550 MG TABS tablet Take 1 tablet (550 mg total) by mouth 2 (two) times daily. 01/14/20   Angiulli, Lavon Paganini, PA-C  traZODone (DESYREL) 50 MG tablet Take 0.5 tablets (25 mg total) by mouth at bedtime. 01/14/20   Angiulli, Lavon Paganini, PA-C  venlafaxine (EFFEXOR) 75 MG tablet Take 1 tablet (75 mg total) by mouth daily. 01/14/20   Angiulli, Lavon Paganini, PA-C  zinc sulfate 220 (50 Zn) MG capsule Take 1 capsule (220 mg total) by mouth daily. 01/15/20   Angiulli, Lavon Paganini, PA-C    Allergies    Latex  Review of Systems   Review of Systems  Gastrointestinal: Positive for abdominal pain.  All other systems reviewed and are negative.   Physical Exam Updated Vital Signs BP 113/87   Pulse 89   Temp 98.2 F (36.8 C) (Oral)   Resp 17   Ht 5' 4"  (1.626 m)   Wt 72.6 kg   SpO2 100%   BMI 27.46 kg/m   Physical Exam Vitals and nursing note reviewed.  Constitutional:      Appearance: She is well-developed.  HENT:     Head: Normocephalic and atraumatic.  Cardiovascular:     Rate and Rhythm: Normal rate and regular rhythm.     Heart sounds: No murmur heard.   Pulmonary:     Effort: Pulmonary effort is normal. No respiratory distress.     Breath sounds: Normal  breath sounds.  Abdominal:     Palpations: Abdomen is soft.     Tenderness: There is no guarding or rebound.     Comments: Mild to moderate RUQ tenderness.    Musculoskeletal:        General: No swelling or tenderness.  Skin:    General: Skin is warm and dry.  Neurological:     Mental Status: She is alert and oriented to person, place, and time.  Psychiatric:        Behavior: Behavior normal.     ED Results / Procedures / Treatments  Labs (all labs ordered are listed, but only abnormal results are displayed) Labs Reviewed  COMPREHENSIVE METABOLIC PANEL - Abnormal; Notable for the following components:      Result Value   Potassium 3.3 (*)    Glucose, Bld 103 (*)    Calcium 8.4 (*)    Albumin 3.4 (*)    AST 82 (*)    All other components within normal limits  CBC - Abnormal; Notable for the following components:   Hemoglobin 9.7 (*)    HCT 33.6 (*)    MCV 78.7 (*)    MCH 22.7 (*)    MCHC 28.9 (*)    RDW 16.5 (*)    All other components within normal limits  ETHANOL - Abnormal; Notable for the following components:   Alcohol, Ethyl (B) 331 (*)    All other components within normal limits  AMMONIA - Abnormal; Notable for the following components:   Ammonia 48 (*)    All other components within normal limits  ACETAMINOPHEN LEVEL - Abnormal; Notable for the following components:   Acetaminophen (Tylenol), Serum <10 (*)    All other components within normal limits  SALICYLATE LEVEL - Abnormal; Notable for the following components:   Salicylate Lvl <6.1 (*)    All other components within normal limits  CBG MONITORING, ED - Abnormal; Notable for the following components:   Glucose-Capillary 106 (*)    All other components within normal limits  LIPASE, BLOOD  URINALYSIS, ROUTINE W REFLEX MICROSCOPIC  I-STAT BETA HCG BLOOD, ED ([facility], [facility], [facility] ONLY)    EKG EKG Interpretation  Date/Time:  Sunday July 03 2020 11:22:22 EDT Ventricular Rate:  88 PR Interval:    QRS  Duration: 108 QT Interval:  374 QTC Calculation: 453 R Axis:   84 Text Interpretation: Sinus rhythm Confirmed by Quintella Reichert [phone]) on 07/03/2020 11:26:44 AM   Radiology No results found.  Procedures Procedures (including critical care time)  Medications Ordered in ED Medications  ondansetron (ZOFRAN) injection 4 mg (4 mg Intravenous Not Given 07/03/20 1158)    ED Course  I have reviewed the triage vital signs and the nursing notes.  Pertinent labs & imaging results that were available during my care of the patient were reviewed by me and considered in my medical decision making (see chart for details).    MDM Rules/Calculators/A&P                         patient with history of alcoholic liver disease here for evaluation of right upper quadrant abdominal pain in setting of alcohol relapse. On evaluation patient reports drinking alcohol but does not appear overtly intoxicated. She did briefly fall asleep with decreased responsiveness during her ED stay. On reassessment patient is awake, alert and oriented to person place and time unable to ambulate with a steady gait. She has no peritoneal findings on abdominal examination. Presentation is not consistent with serious bacterial infection. Labs with stable anemia. Ammonia level is mildly elevated but given patient's normal mental status current presentation is not consistent with hepatic encephalopathy. On reassessment patient is requesting to go home. She was offered resources for rehab. Plan to discharge home with outpatient follow-up as well as return precautions.  Final Clinical Impression(s) / ED Diagnoses Final diagnoses:  Alcoholic intoxication without complication ([place])  RUQ pain    Rx / DC Orders ED Discharge Orders         Ordered    ondansetron [facility]  ODT) 4 MG disintegrating tablet  Every 8 hours PRN        07/03/20 1253           Quintella Reichert, MD 07/03/20 1342

## 2020-12-31 DIAGNOSIS — K7030 Alcoholic cirrhosis of liver without ascites: Secondary | ICD-10-CM | POA: Diagnosis present

## 2021-01-03 ENCOUNTER — Other Ambulatory Visit: Payer: Self-pay

## 2021-01-03 ENCOUNTER — Emergency Department (HOSPITAL_COMMUNITY)
Admission: EM | Admit: 2021-01-03 | Discharge: 2021-01-04 | Disposition: A | Discharge status: Home / Self Care | Payer: Medicaid Other | Attending: Emergency Medicine | Admitting: Emergency Medicine

## 2021-01-03 ENCOUNTER — Encounter (HOSPITAL_COMMUNITY): Payer: Self-pay | Admitting: Emergency Medicine

## 2021-01-03 DIAGNOSIS — R509 Fever, unspecified: Secondary | ICD-10-CM | POA: Diagnosis not present

## 2021-01-03 DIAGNOSIS — Z20822 Contact with and (suspected) exposure to covid-19: Secondary | ICD-10-CM | POA: Diagnosis not present

## 2021-01-03 DIAGNOSIS — R109 Unspecified abdominal pain: Secondary | ICD-10-CM | POA: Insufficient documentation

## 2021-01-03 DIAGNOSIS — Z5321 Procedure and treatment not carried out due to patient leaving prior to being seen by health care provider: Secondary | ICD-10-CM | POA: Insufficient documentation

## 2021-01-03 DIAGNOSIS — R0602 Shortness of breath: Secondary | ICD-10-CM | POA: Insufficient documentation

## 2021-01-03 DIAGNOSIS — R1110 Vomiting, unspecified: Secondary | ICD-10-CM | POA: Diagnosis not present

## 2021-01-03 NOTE — ED Triage Notes (Addendum)
Emergency Medicine Provider Triage Evaluation Note  Joann [name] , a 40 y.o. female  was evaluated in triage.  Pt complains of concern for COVID. Reports son, mother, step father tested positive. Not feeling right, chills, vomiting, diarrhea, breathing hard. Talking on the phone while answering triage questions.   Review of Systems  Positive: Fevers, chills, vomiting, diarrhea Negative: No CP, abdominal pain.   Physical Exam  BP 133/85 (BP Location: Right Arm)   Pulse 95   Temp 98.3 F (36.8 C) (Oral)   Resp 18   SpO2 97%  Gen:   Awake, no distress   HEENT:  Atraumatic  Resp:  Normal effort, lungs clear  Cardiac:  Normal rate  Abd:   Nondistended, non tender  MSK:   Moves extremities without difficulty Neuro:  Speech clear  Medical Decision Making  Medically screening exam initiated at 10:11 PM.  Appropriate orders placed.  Joann [name] was informed that the remainder of the evaluation will be completed by another provider, this initial triage assessment does not replace that evaluation, and the importance of remaining in the ED until their evaluation is complete.  Clinical Impression   Patient here for COVID test before she goes back to work.  Symptoms described as mild.    COVID swabs ordered. Exam benign, normal vitals. Will defer any more extensive work up per main ED provider.    Patient was informed that the remainder of the evaluation will be completed by another provider.  Patient made aware this encounter is a triage and screening encounter and no beds are available at this time in the ER.  Patient made aware triage orders have been placed and patient will be placed in the waiting room while work up is initiated. Patient encouraged to await a formal ER encounter with a clinician once an ER bed becomes available.  Patient made aware that exiting the department prior to formal encounter with an ER clinician and completion of the work-up is considered leaving against medical  advice.  At that time there is no guarantee that there are no emergency medical conditions present and patient assumes risks of leaving without completion of ER evaluation including worsening condition, permanent disability and death. Patient verbalizes understanding. Patient has signed triage/MSE paperwork.     Kinnie Feil, PA-C 01/03/21 2228

## 2021-01-03 NOTE — ED Triage Notes (Signed)
Patient here after having two days of covid symptoms.  Son was diagnosed with covid this morning.  Patient states she has some shortness of breath with some stomach upset, just not feeling well.

## 2021-02-02 ENCOUNTER — Other Ambulatory Visit: Payer: Self-pay | Admitting: Nurse Practitioner

## 2021-02-02 DIAGNOSIS — K7030 Alcoholic cirrhosis of liver without ascites: Secondary | ICD-10-CM

## 2021-02-16 ENCOUNTER — Other Ambulatory Visit: Payer: Medicaid Other

## 2021-02-16 ENCOUNTER — Ambulatory Visit
Admission: RE | Admit: 2021-02-16 | Discharge: 2021-02-16 | Disposition: A | Discharge status: Home / Self Care | Payer: Medicaid Other | Source: Ambulatory Visit | Attending: Nurse Practitioner | Admitting: Nurse Practitioner

## 2021-02-16 DIAGNOSIS — K7030 Alcoholic cirrhosis of liver without ascites: Secondary | ICD-10-CM

## 2021-03-02 ENCOUNTER — Other Ambulatory Visit: Payer: Medicaid Other

## 2021-10-31 DIAGNOSIS — I8500 Esophageal varices without bleeding: Secondary | ICD-10-CM | POA: Insufficient documentation

## 2021-12-17 ENCOUNTER — Other Ambulatory Visit: Payer: Self-pay

## 2021-12-17 ENCOUNTER — Observation Stay (HOSPITAL_COMMUNITY): Payer: Medicaid Other

## 2021-12-17 ENCOUNTER — Encounter (HOSPITAL_COMMUNITY): Payer: Self-pay | Admitting: Emergency Medicine

## 2021-12-17 ENCOUNTER — Inpatient Hospital Stay (HOSPITAL_COMMUNITY)
Admission: EM | Admit: 2021-12-17 | Discharge: 2021-12-22 | DRG: 442 | Disposition: A | Discharge status: Home / Self Care | Payer: Medicaid Other | Attending: Family Medicine | Admitting: Family Medicine

## 2021-12-17 DIAGNOSIS — D500 Iron deficiency anemia secondary to blood loss (chronic): Secondary | ICD-10-CM | POA: Diagnosis present

## 2021-12-17 DIAGNOSIS — I8510 Secondary esophageal varices without bleeding: Secondary | ICD-10-CM | POA: Diagnosis present

## 2021-12-17 DIAGNOSIS — D649 Anemia, unspecified: Principal | ICD-10-CM | POA: Diagnosis present

## 2021-12-17 DIAGNOSIS — D696 Thrombocytopenia, unspecified: Secondary | ICD-10-CM | POA: Diagnosis not present

## 2021-12-17 DIAGNOSIS — F419 Anxiety disorder, unspecified: Secondary | ICD-10-CM | POA: Diagnosis present

## 2021-12-17 DIAGNOSIS — Z781 Physical restraint status: Secondary | ICD-10-CM

## 2021-12-17 DIAGNOSIS — E512 Wernicke's encephalopathy: Secondary | ICD-10-CM | POA: Diagnosis present

## 2021-12-17 DIAGNOSIS — K7030 Alcoholic cirrhosis of liver without ascites: Secondary | ICD-10-CM | POA: Diagnosis present

## 2021-12-17 DIAGNOSIS — I959 Hypotension, unspecified: Secondary | ICD-10-CM | POA: Diagnosis present

## 2021-12-17 DIAGNOSIS — K860 Alcohol-induced chronic pancreatitis: Secondary | ICD-10-CM | POA: Diagnosis present

## 2021-12-17 DIAGNOSIS — I10 Essential (primary) hypertension: Secondary | ICD-10-CM | POA: Diagnosis present

## 2021-12-17 DIAGNOSIS — K922 Gastrointestinal hemorrhage, unspecified: Secondary | ICD-10-CM | POA: Diagnosis not present

## 2021-12-17 DIAGNOSIS — T68XXXA Hypothermia, initial encounter: Secondary | ICD-10-CM | POA: Diagnosis not present

## 2021-12-17 DIAGNOSIS — K7010 Alcoholic hepatitis without ascites: Secondary | ICD-10-CM | POA: Diagnosis present

## 2021-12-17 DIAGNOSIS — D684 Acquired coagulation factor deficiency: Secondary | ICD-10-CM | POA: Diagnosis present

## 2021-12-17 DIAGNOSIS — Z833 Family history of diabetes mellitus: Secondary | ICD-10-CM

## 2021-12-17 DIAGNOSIS — K7682 Hepatic encephalopathy: Secondary | ICD-10-CM | POA: Diagnosis present

## 2021-12-17 DIAGNOSIS — Z91199 Patient's noncompliance with other medical treatment and regimen due to unspecified reason: Secondary | ICD-10-CM

## 2021-12-17 DIAGNOSIS — Z811 Family history of alcohol abuse and dependence: Secondary | ICD-10-CM | POA: Diagnosis not present

## 2021-12-17 DIAGNOSIS — E876 Hypokalemia: Secondary | ICD-10-CM | POA: Diagnosis present

## 2021-12-17 DIAGNOSIS — K766 Portal hypertension: Secondary | ICD-10-CM | POA: Diagnosis present

## 2021-12-17 DIAGNOSIS — Z8249 Family history of ischemic heart disease and other diseases of the circulatory system: Secondary | ICD-10-CM | POA: Diagnosis not present

## 2021-12-17 DIAGNOSIS — Z91040 Latex allergy status: Secondary | ICD-10-CM

## 2021-12-17 DIAGNOSIS — I651 Occlusion and stenosis of basilar artery: Secondary | ICD-10-CM | POA: Diagnosis present

## 2021-12-17 DIAGNOSIS — D6959 Other secondary thrombocytopenia: Secondary | ICD-10-CM | POA: Diagnosis present

## 2021-12-17 DIAGNOSIS — G4700 Insomnia, unspecified: Secondary | ICD-10-CM | POA: Diagnosis present

## 2021-12-17 DIAGNOSIS — F1010 Alcohol abuse, uncomplicated: Secondary | ICD-10-CM | POA: Diagnosis present

## 2021-12-17 DIAGNOSIS — R040 Epistaxis: Secondary | ICD-10-CM | POA: Diagnosis present

## 2021-12-17 DIAGNOSIS — R4182 Altered mental status, unspecified: Secondary | ICD-10-CM | POA: Diagnosis not present

## 2021-12-17 LAB — COMPREHENSIVE METABOLIC PANEL
ALT: 18 U/L (ref 0–44)
AST: 73 U/L — ABNORMAL HIGH (ref 15–41)
Albumin: 3.1 g/dL — ABNORMAL LOW (ref 3.5–5.0)
Alkaline Phosphatase: 149 U/L — ABNORMAL HIGH (ref 38–126)
Anion gap: 7 (ref 5–15)
BUN: <5 mg/dL — ABNORMAL LOW (ref 6–20)
CO2: 23 mmol/L (ref 22–32)
Calcium: 8.3 mg/dL — ABNORMAL LOW (ref 8.9–10.3)
Chloride: 106 mmol/L (ref 98–111)
Creatinine, Ser: 0.78 mg/dL (ref 0.44–1.00)
GFR, Estimated: >60 mL/min (ref >60)
Glucose, Bld: 103 mg/dL — ABNORMAL HIGH (ref 70–99)
Potassium: 2 mmol/L — CL (ref 3.5–5.1)
Sodium: 136 mmol/L (ref 135–145)
Total Bilirubin: 4 mg/dL — ABNORMAL HIGH (ref 0.3–1.2)
Total Protein: 8.6 g/dL — ABNORMAL HIGH (ref 6.5–8.1)

## 2021-12-17 LAB — RETICULOCYTES
Immature Retic Fract: 24.4 % — ABNORMAL HIGH (ref 2.3–15.9)
RBC.: 2.16 MIL/uL — ABNORMAL LOW (ref 3.87–5.11)
Retic Count, Absolute: 50.5 10*3/uL (ref 19.0–186.0)
Retic Ct Pct: 2.3 % (ref 0.4–3.1)

## 2021-12-17 LAB — CBC
HCT: 17.6 % — ABNORMAL LOW (ref 36.0–46.0)
Hemoglobin: 4.4 g/dL — CL (ref 12.0–15.0)
MCH: 19.9 pg — ABNORMAL LOW (ref 26.0–34.0)
MCHC: 25 g/dL — ABNORMAL LOW (ref 30.0–36.0)
MCV: 79.6 fL — ABNORMAL LOW (ref 80.0–100.0)
Platelets: 66 10*3/uL — ABNORMAL LOW (ref 150–400)
RBC: 2.21 MIL/uL — ABNORMAL LOW (ref 3.87–5.11)
RDW: 26.1 % — ABNORMAL HIGH (ref 11.5–15.5)
WBC: 5.3 10*3/uL (ref 4.0–10.5)
nRBC: 0 % (ref 0.0–0.2)

## 2021-12-17 LAB — FOLATE: Folate: 9.7 ng/mL (ref >5.9)

## 2021-12-17 LAB — VITAMIN B12: Vitamin B-12: 586 pg/mL (ref 180–914)

## 2021-12-17 LAB — RAPID URINE DRUG SCREEN, HOSP PERFORMED
Amphetamines: NOT DETECTED
Barbiturates: NOT DETECTED
Benzodiazepines: NOT DETECTED
Cocaine: NOT DETECTED
Opiates: NOT DETECTED
Tetrahydrocannabinol: NOT DETECTED

## 2021-12-17 LAB — PROTIME-INR
INR: 1.5 — ABNORMAL HIGH (ref 0.8–1.2)
Prothrombin Time: 18.4 seconds — ABNORMAL HIGH (ref 11.4–15.2)

## 2021-12-17 LAB — MAGNESIUM: Magnesium: 1.6 mg/dL — ABNORMAL LOW (ref 1.7–2.4)

## 2021-12-17 LAB — ETHANOL: Alcohol, Ethyl (B): <10 mg/dL (ref <10)

## 2021-12-17 LAB — I-STAT BETA HCG BLOOD, ED (MC, WL, AP ONLY): I-stat hCG, quantitative: <5 m[IU]/mL (ref <5)

## 2021-12-17 LAB — IRON AND TIBC
Iron: 15 ug/dL — ABNORMAL LOW (ref 28–170)
Saturation Ratios: 3 % — ABNORMAL LOW (ref 10.4–31.8)
TIBC: 535 ug/dL — ABNORMAL HIGH (ref 250–450)
UIBC: 520 ug/dL

## 2021-12-17 LAB — PREPARE RBC (CROSSMATCH)

## 2021-12-17 LAB — AMMONIA: Ammonia: 198 umol/L — ABNORMAL HIGH (ref 9–35)

## 2021-12-17 LAB — POC OCCULT BLOOD, ED: Fecal Occult Bld: NEGATIVE

## 2021-12-17 LAB — FERRITIN: Ferritin: 6 ng/mL — ABNORMAL LOW (ref 11–307)

## 2021-12-17 MED ORDER — LACTULOSE 10 GM/15ML PO SOLN
30.0000 g | Freq: Once | ORAL | Status: AC
  Administered 2021-12-17: 30 g via ORAL
  Filled 2021-12-17: qty 45

## 2021-12-17 MED ORDER — ADULT MULTIVITAMIN W/MINERALS CH
1.0000 | ORAL_TABLET | Freq: Every day | ORAL | Status: DC
  Administered 2021-12-17 – 2021-12-22 (×6): 1 via ORAL
  Filled 2021-12-17 (×6): qty 1

## 2021-12-17 MED ORDER — FOLIC ACID 1 MG PO TABS
1.0000 mg | ORAL_TABLET | Freq: Every day | ORAL | Status: DC
  Administered 2021-12-17 – 2021-12-18 (×2): 1 mg via ORAL
  Filled 2021-12-17 (×2): qty 1

## 2021-12-17 MED ORDER — TRAZODONE HCL 150 MG PO TABS
150.0000 mg | ORAL_TABLET | Freq: Every evening | ORAL | Status: DC | PRN
  Administered 2021-12-17: 150 mg via ORAL
  Filled 2021-12-17: qty 3

## 2021-12-17 MED ORDER — OCTREOTIDE LOAD VIA INFUSION
50.0000 ug | Freq: Once | INTRAVENOUS | Status: AC
  Administered 2021-12-17: 50 ug via INTRAVENOUS
  Filled 2021-12-17: qty 25

## 2021-12-17 MED ORDER — LACTULOSE 10 GM/15ML PO SOLN
20.0000 g | Freq: Three times a day (TID) | ORAL | Status: DC
  Administered 2021-12-18 – 2021-12-19 (×4): 20 g via ORAL
  Filled 2021-12-17 (×6): qty 30

## 2021-12-17 MED ORDER — PANTOPRAZOLE SODIUM 40 MG IV SOLR
40.0000 mg | Freq: Two times a day (BID) | INTRAVENOUS | Status: DC
  Administered 2021-12-18 – 2021-12-21 (×7): 40 mg via INTRAVENOUS
  Filled 2021-12-17 (×7): qty 10

## 2021-12-17 MED ORDER — POTASSIUM CHLORIDE 10 MEQ/100ML IV SOLN
10.0000 meq | INTRAVENOUS | Status: AC
  Administered 2021-12-17 (×6): 10 meq via INTRAVENOUS
  Filled 2021-12-17 (×6): qty 100

## 2021-12-17 MED ORDER — LORAZEPAM 2 MG/ML IJ SOLN: 1.0000 mg | INTRAMUSCULAR | Status: DC | PRN

## 2021-12-17 MED ORDER — SODIUM CHLORIDE 0.9 % IV SOLN
10.0000 mL/h | Freq: Once | INTRAVENOUS | Status: AC
Start: 2021-12-17 — End: 2021-12-17
  Administered 2021-12-17: 10 mL/h via INTRAVENOUS

## 2021-12-17 MED ORDER — POTASSIUM CHLORIDE 10 MEQ/100ML IV SOLN
10.0000 meq | INTRAVENOUS | Status: AC
  Administered 2021-12-18 (×4): 10 meq via INTRAVENOUS
  Filled 2021-12-17 (×4): qty 100

## 2021-12-17 MED ORDER — SODIUM CHLORIDE 0.9 % IV SOLN
2.0000 g | INTRAVENOUS | Status: DC
  Administered 2021-12-17 – 2021-12-19 (×3): 2 g via INTRAVENOUS
  Filled 2021-12-17 (×4): qty 20

## 2021-12-17 MED ORDER — PANTOPRAZOLE SODIUM 40 MG IV SOLR
80.0000 mg | Freq: Once | INTRAVENOUS | Status: AC
  Administered 2021-12-17: 80 mg via INTRAVENOUS
  Filled 2021-12-17: qty 20

## 2021-12-17 MED ORDER — PANTOPRAZOLE SODIUM 40 MG IV SOLR: 40.0000 mg | Freq: Two times a day (BID) | INTRAVENOUS | Status: DC

## 2021-12-17 MED ORDER — MAGNESIUM SULFATE 2 GM/50ML IV SOLN
2.0000 g | Freq: Once | INTRAVENOUS | Status: AC
  Administered 2021-12-17: 2 g via INTRAVENOUS
  Filled 2021-12-17: qty 50

## 2021-12-17 MED ORDER — LORAZEPAM 1 MG PO TABS
1.0000 mg | ORAL_TABLET | ORAL | Status: DC | PRN
  Administered 2021-12-17: 1 mg via ORAL
  Filled 2021-12-17: qty 1

## 2021-12-17 MED ORDER — SODIUM CHLORIDE 0.9 % IV SOLN
50.0000 ug/h | INTRAVENOUS | Status: DC
  Administered 2021-12-17 – 2021-12-20 (×5): 50 ug/h via INTRAVENOUS
  Filled 2021-12-17 (×7): qty 1

## 2021-12-17 MED ORDER — THIAMINE HCL 100 MG PO TABS
100.0000 mg | ORAL_TABLET | Freq: Every day | ORAL | Status: DC
  Administered 2021-12-17 – 2021-12-18 (×2): 100 mg via ORAL
  Filled 2021-12-17 (×2): qty 1

## 2021-12-17 NOTE — Progress Notes (Signed)
Obtained call for admission in patient with severe anemia, hepatic encephalopathy and severe metabolic derangements. History of alcoholism with attempted detox 2 weeks ago, reported last drink of 10 days ago per ER, history of varices.  ? ?Patient is very upset with need for admission and does not think that she needs it. Unsure if patient is truly aware of the risks of leaving AMA, discuss risk of death with the severe anemia and potassium. ? ?Will start CTX for SBP prophylaxis and will need close CIWAs.  ? ?Patient's husband on the way to calm patient, night team will come by for full admission.  ? ? ?Kynslie Ringle, DO  ?

## 2021-12-17 NOTE — ED Provider Notes (Signed)
?[facility] ?Provider Note ? ? ?CSN: [id number] ?Arrival date & time: 12/17/21  1434 ? ?  ? ?History ? ?Chief Complaint  ?Patient presents with  ? Altered Mental Status  ? ? ?Joann [name] is a 41 y.o. female. ? ?HPI ? ?  ? ?41 year old female comes in with chief complaint of altered mental status. ?Patient has history of alcoholism and resultant complications which include esophageal varices and hepatic encephalopathy. ? ?She is brought here by her husband who indicates that patient has not been doing well over the last few days.  Patient has been more confused, agitated and acting different.  Patient has not taken lactulose in several months now.  He thinks that she is acting like she was when she had hepatic encephalopathy.  He also indicates that patient's sclera appears more yellow starting around December time.  Patient's last alcoholic beverage was likely 1 to 2 weeks ago. ? ?Patient's hemoglobin is noted to be just over 4.  She indicates that she has noted some dark tarry stool.  She also has had some vomiting, vomitus has been coffee colored or clear. ? ?Home Medications ?Prior to Admission medications   ?Medication Sig Start Date End Date Taking? Authorizing Provider  ?Multiple Vitamin (MULTIVITAMIN WITH MINERALS) TABS tablet Take 1 tablet by mouth daily. 01/15/20  Yes Angiulli, Lavon Paganini, PA-C  ?traZODone (DESYREL) 150 MG tablet Take 150 mg by mouth at bedtime as needed for sleep. 11/05/21  Yes [provider]  ?folic acid (FOLVITE) 1 MG tablet Take 1 tablet (1 mg total) by mouth daily. ?Patient not taking: Reported on 12/17/2021 01/14/20   Angiulli, Lavon Paganini, PA-C  ?lactulose (CHRONULAC) 10 GM/15ML solution Take 45 mLs (30 g total) by mouth every 8 (eight) hours. ?Patient not taking: Reported on 12/17/2021 02/01/20   Izora Ribas, MD  ?magnesium gluconate (MAGONATE) 30 MG tablet Take 1 tablet (30 mg total) by mouth 2 (two) times daily. ?Patient not taking: Reported  on 12/17/2021 02/01/20   Izora Ribas, MD  ?midodrine (PROAMATINE) 5 MG tablet Take 1 tablet (5 mg total) by mouth 3 (three) times daily with meals. ?Patient not taking: Reported on 12/17/2021 01/14/20   Angiulli, Lavon Paganini, PA-C  ?ondansetron (ZOFRAN ODT) 4 MG disintegrating tablet Take 1 tablet (4 mg total) by mouth every 8 (eight) hours as needed for nausea or vomiting. ?Patient not taking: Reported on 12/17/2021 07/03/20   Quintella Reichert, MD  ?potassium chloride 20 MEQ TBCR Take 10 mEq by mouth daily. ?Patient not taking: Reported on 12/17/2021 01/16/20   [name], MD  ?prednisoLONE 5 MG TABS tablet Take 8 tablets (40 mg total) by mouth daily. ?Patient not taking: Reported on 12/17/2021 01/15/20   Cathlyn Parsons, PA-C  ?rifaximin (XIFAXAN) 550 MG TABS tablet Take 1 tablet (550 mg total) by mouth 2 (two) times daily. ?Patient not taking: Reported on 12/17/2021 01/14/20   Angiulli, Lavon Paganini, PA-C  ?traZODone (DESYREL) 50 MG tablet Take 0.5 tablets (25 mg total) by mouth at bedtime. ?Patient not taking: Reported on 12/17/2021 01/14/20   Cathlyn Parsons, PA-C  ?venlafaxine (EFFEXOR) 75 MG tablet Take 1 tablet (75 mg total) by mouth daily. ?Patient not taking: Reported on 12/17/2021 01/14/20   Cathlyn Parsons, PA-C  ?zinc sulfate 220 (50 Zn) MG capsule Take 1 capsule (220 mg total) by mouth daily. ?Patient not taking: Reported on 12/17/2021 01/15/20   Cathlyn Parsons, PA-C  ?   ? ?  Allergies    ?Latex   ? ?Review of Systems   ?Review of Systems  ?All other systems reviewed and are negative. ? ?Physical Exam ?Updated Vital Signs ?BP 107/72   Pulse 86   Temp 98.7 ?F (37.1 ?C) (Oral)   Resp (!) 25   SpO2 100%  ?Physical Exam ?Vitals and nursing note reviewed.  ?Constitutional:   ?   Appearance: She is well-developed.  ?HENT:  ?   Head: Atraumatic.  ?Eyes:  ?   General: Scleral icterus present.  ?   Extraocular Movements: Extraocular movements intact.  ?   Pupils: Pupils are equal, round, and reactive to light.   ?Cardiovascular:  ?   Rate and Rhythm: Normal rate.  ?Pulmonary:  ?   Effort: Pulmonary effort is normal.  ?Abdominal:  ?   General: There is distension.  ?   Comments: Guaiac negative stools  ?Musculoskeletal:  ?   Cervical back: Normal range of motion and neck supple.  ?Skin: ?   General: Skin is warm and dry.  ?Neurological:  ?   Mental Status: She is alert. She is disoriented.  ?   Comments: Negative asterixis  ? ? ?ED Results / Procedures / Treatments   ?Labs ?(all labs ordered are listed, but only abnormal results are displayed) ?Labs Reviewed  ?COMPREHENSIVE METABOLIC PANEL - Abnormal; Notable for the following components:  ?    Result Value  ? Potassium 2.0 (*)   ? Glucose, Bld 103 (*)   ? BUN <5 (*)   ? Calcium 8.3 (*)   ? Total Protein 8.6 (*)   ? Albumin 3.1 (*)   ? AST 73 (*)   ? Alkaline Phosphatase 149 (*)   ? Total Bilirubin 4.0 (*)   ? All other components within normal limits  ?CBC - Abnormal; Notable for the following components:  ? RBC 2.21 (*)   ? Hemoglobin 4.4 (*)   ? HCT 17.6 (*)   ? MCV 79.6 (*)   ? MCH 19.9 (*)   ? MCHC 25.0 (*)   ? RDW 26.1 (*)   ? Platelets 66 (*)   ? All other components within normal limits  ?AMMONIA - Abnormal; Notable for the following components:  ? Ammonia 198 (*)   ? All other components within normal limits  ?PROTIME-INR - Abnormal; Notable for the following components:  ? Prothrombin Time 18.4 (*)   ? INR 1.5 (*)   ? All other components within normal limits  ?IRON AND TIBC - Abnormal; Notable for the following components:  ? Iron 15 (*)   ? TIBC 535 (*)   ? Saturation Ratios 3 (*)   ? All other components within normal limits  ?FERRITIN - Abnormal; Notable for the following components:  ? Ferritin 6 (*)   ? All other components within normal limits  ?RETICULOCYTES - Abnormal; Notable for the following components:  ? RBC. 2.16 (*)   ? Immature Retic Fract 24.4 (*)   ? All other components within normal limits  ?MAGNESIUM - Abnormal; Notable for the following  components:  ? Magnesium 1.6 (*)   ? All other components within normal limits  ?ETHANOL  ?VITAMIN B12  ?RAPID URINE DRUG SCREEN, HOSP PERFORMED  ?FOLATE  ?I-STAT BETA HCG BLOOD, ED ([facility], [facility], [facility] ONLY)  ?POC OCCULT BLOOD, ED  ?TYPE AND SCREEN  ?PREPARE RBC (CROSSMATCH)  ? ? ?EKG ?None ? ?Radiology ?No results found. ? ?Procedures ?Marland KitchenCritical Care ?Performed by: Varney Biles, MD ?Authorized  by: Varney Biles, MD  ? ?Critical care provider statement:  ?  Critical care time (minutes):  57 ?  Critical care was necessary to treat or prevent imminent or life-threatening deterioration of the following conditions:  Circulatory failure, hepatic failure and metabolic crisis ?  Critical care was time spent personally by me on the following activities:  Development of treatment plan with patient or surrogate, discussions with consultants, evaluation of patient's response to treatment, examination of patient, ordering and review of laboratory studies, ordering and review of radiographic studies, ordering and performing treatments and interventions, pulse oximetry, re-evaluation of patient's condition and review of old charts  ? ? ?Medications Ordered in ED ?Medications  ?octreotide (SANDOSTATIN) 2 mcg/mL load via infusion 50 mcg (50 mcg Intravenous Bolus from Bag 12/17/21 1710)  ?  And  ?octreotide (SANDOSTATIN) 500 mcg in sodium chloride 0.9 % 250 mL (2 mcg/mL) infusion (50 mcg/hr Intravenous New Bag/Given 12/17/21 1707)  ?potassium chloride 10 mEq in 100 mL IVPB (10 mEq Intravenous New Bag/Given 12/17/21 1758)  ?magnesium sulfate IVPB 2 g 50 mL (2 g Intravenous New Bag/Given 12/17/21 1824)  ?0.9 %  sodium chloride infusion (has no administration in time range)  ?lactulose (CHRONULAC) 10 GM/15ML solution 30 g (has no administration in time range)  ?pantoprazole (PROTONIX) injection 80 mg (80 mg Intravenous Given 12/17/21 1646)  ? ? ?ED Course/ Medical Decision Making/ A&P ?Clinical Course as of 12/17/21 1827  ?Nancy Fetter Dec 17, 2021  ?1826  Magnesium(!): 1.6 ?Patient has profound hypokalemia and hypomagnesemia.  IV potassium and mag ordered. [AN]  ?1826 POC occult blood, ED ?Coag negative stool.  No bleeding at this time.  We will consult GI nonetheless. ?

## 2021-12-17 NOTE — H&P (Addendum)
Family Medicine Teaching Service ?Hospital Admission History and Physical ?Service Pager: [phone] ? ?Patient name: Joann [name] Medical record number: [id number] ?Date of birth: [birth year] Age: 41 y.o. Gender: female ? ?Primary Care Provider: Hughie Closs, PA-C ?Consultants: None ?Code Status: Full  ?Preferred Emergency Contact: Ameria Sanjurjo (spouse) [phone] ? ?Chief Complaint: Altered mental status ? ?Assessment and Plan: ?Joann [name] is a 41 y.o. female presenting with 1 to 2 weeks of increasing confusion and agitation. PMH is significant for alcoholic cirrhosis, history of hepatic encephalopathy, etOH abuse, history of alcoholic pancreatitis and esophageal varices. ? ?Hepatic Encephalopathy  Alcoholic Cirrhosis ?Patient presents with 1 to 2 weeks of increasing confusion and agitation along with jaundiced skin and scleral icterus found to have an ammonia level elevated to 198. AST 73, ALT 18, Tbili 4.0. Patient also presenting with asterixis on exam raising concern for active hepatic encephalopathy.  She is oriented only to person and place and quite confused on exam, cannot articulate what she is in the hospital and gives a halting and tangential history.  Has a history of admission for similar presentation 2 years ago that ultimately required ICU stay with intubation.  Upper GI study at that time showed small varices without active bleed. ?Last drink was ?10 days ago but given history of heavy alcohol use  and uncertainty surrounding timing of last drink, will monitor closely for acute withdrawal. She is somewhat agitated on exam but is generally redirectable.  Husband is at bedside and is helpful in redirection. S/p Lactulose 30g x1 in the ED.  ?- Admit to Med-Tele, Dr. Owens Shark Attending ?- Lactulose 20g TID ?- RUQ ultrasound  ?- CIWA protocol with Ativan ?- One-to-one sitter for safety ?- TOC consult for substance abuse ?- Thiamine, folate, MVI ?- PT/OT eval and treat ?- AM CMP, INR ? ?Anemia   Concern For GI Bleed ?Thrombocytopenia  Elevated INR ?Hemoglobin 4.4 on admission.  MCV 79.6. Plt 66. PT 18.4, INR 1.5. Ferritin 6. Iron 15, Sat ratio 3, TIBC 535. No recent labs to compare for recent baseline, but had a hemoglobin of 9.718 months ago.  She remains hemodynamically stable, though it is intermittently tachycardic to the low 100s.  3/6 systolic murmur on exam that is new per chart review, suspect this is secondary to her anemia.  Given history of ?melena, suspect anemia is multifactorial, involving chronic anemia 2/2 etOH abuse, iron deficiency as evidenced by anemia panel in the setting of poor PO intake, and possible GI blood losses.  FOBT negative in the ED and no evidence of ongoing active blood loss at time of exam.  Has a history of upper endoscopy with small varices and was therefore started on octreotide EDP. S/p 1 u PRBC in the ED, three more ordered.  ?- Ceftriaxone 2g q24h for SBP ppx ?- Protonix 86m IV BID ?- 2 large bore Ivs ?- Octreotide gtt ?- Follow-up post-transfusion H&H, will administer more blood as indicated to transfusion threshold of 7.0 ?- AM CBC ?- Will consult GI tomorrow morning ?- N.p.o. at midnight in case needs scope tomorrow ?- No pharmacologic DVT ppx given concern for bleed ? ?Hypokalemia  Hypomagnesemia ?K <2.0, Mag 1.6. Patient received 6 runs of K and 2g Magnesium in the ED. EKG pending.  ?- Follow-up EKG ?- Will give  another 40 mEq KCl tonight ?- Re-check electrolytes tomorrow am ? ?Insomnia ?Takes Trazodone 1535mQHS at home ?- Continue home Trazodone ?  ?FEN/GI: Regular diet, [place]s ? ?  Disposition: Med telemetry ? ?History of Present Illness:  Joann [name] is a 41 y.o. female presenting with 1-2 weeks of worsening confusion, agitation, yellowing of the skin. ?History provided mostly by husband given the patient's acute confusion.   ?He reports that about 2-3 weeks ago she went through alcohol withdrawals at home with vomiting,  shaking, agitation.  However, she has had at least 1 episode of heavy drinking since that time.  He thinks about 10 days ago she drank 1/5 of liquor.  She denies any other alcohol use since that time. ?Her husband has noticed that in the past 2 weeks she has been increasingly confused and agitated.  He notes that when she talks she will stop midsentence and repeat what she had already said. Has been admitted for hepatic encephalopathy before and husband says she present similar to how she presents today. Was on lactulose previously and has been off of it for a little over a year. Up until this present episode, she was doing quite well and acting like herself.  ?Patient believes she is in the hospital for alcohol detox. Endorses black stool for the past 2 weeks. Denies blood in vomit. She reports that her last drink was 3 weeks ago, husband thinks it was more like 10 days. When she does drink, she will drink a fifth of liquor at a time.  When she is drinking, she does not eat much in the way of food.  She has not had anything to drink today, she did eat some food at her mother's house yesterday.  He has noticed that she has been more tremulous recently compared to previous. ? ?Review Of Systems: Per HPI with the following additions:  ? ?Review of Systems  ?Constitutional:  Positive for appetite change. Negative for diaphoresis and fever.  ?Respiratory:  Negative for shortness of breath.   ?Cardiovascular:  Negative for chest pain and leg swelling.  ?Gastrointestinal:  Positive for blood in stool (melena). Negative for abdominal distention and diarrhea.  ?Genitourinary: Negative.  Negative for difficulty urinating.  ?Neurological:  Positive for tremors.   ? ?Patient Active Problem List  ? Diagnosis Date Noted  ? Symptomatic anemia 12/17/2021  ? 2323 03/22/2020  ? Reactive depression   ? Wernicke's encephalopathy   ? Transaminitis   ? Steroid-induced hyperglycemia   ? Leucocytosis   ? Orthostasis   ? Macrocytic anemia    ? AMS (altered mental status)   ? Sleep disturbance   ? Pressure injury of skin 01/05/2020  ? Alcoholic hepatitis with ascites   ? Acute respiratory failure ([place])   ? Palliative care encounter   ? Elevated bilirubin   ? Ascites   ? Advanced care planning/counseling discussion   ? Goals of care, counseling/discussion   ? Palliative care by specialist   ? Hypoalbuminemia   ? Hepatic encephalopathy ([place]) 12/24/2019  ? Alcoholic hepatitis without ascites   ? Hypokalemia due to inadequate potassium intake 04/02/2019  ? Alcohol withdrawal ([place]) 10/24/2018  ? Alcohol dependence with withdrawal, unspecified ([place]) 10/24/2018  ? Elevated LFTs 10/24/2018  ? Thrombocytopenia ([place]) 10/24/2018  ? Alcohol-induced acute pancreatitis   ? Acute blood loss anemia   ? Alcoholic ketoacidosis 03/05/9484  ? Alcohol abuse with alcohol-induced mood disorder ([place]) 09/14/2017  ? Elevated liver enzymes 04/29/2017  ? ? ?Past Medical History: ?Past Medical History:  ?Diagnosis Date  ? Alcohol abuse   ? Anxiety   ? Hypertension   ? Liver disease   ? ? ?Past  Surgical History: ?Past Surgical History:  ?Procedure Laterality Date  ? ECTOPIC PREGNANCY SURGERY    ? ESOPHAGOGASTRODUODENOSCOPY (EGD) WITH PROPOFOL N/A 12/27/2019  ? Procedure: ESOPHAGOGASTRODUODENOSCOPY (EGD) WITH PROPOFOL;  Surgeon: Carol Ada, MD;  Location: [facility];  Service: Endoscopy;  Laterality: N/A;  ? ? ?Social History: ?Social History  ? ?Tobacco Use  ? Smoking status: Never  ? Smokeless tobacco: Never  ?Vaping Use  ? Vaping Use: Never used  ?Substance Use Topics  ? Alcohol use: Yes  ?  Comment: "I just do it"  ? Drug use: Never  ? ? ?Family History: ?Family History  ?Problem Relation Age of Onset  ? Healthy Mother   ? Alcohol abuse Mother   ?     quit in her 51s  ? Alcohol abuse Father   ? Diabetes Father   ? Healthy Brother   ? Hypertension Maternal Grandmother   ? Hypertension Maternal Grandfather   ? Healthy Brother   ? Healthy Brother   ? ? ?Allergies and  Medications: ?Allergies  ?Allergen Reactions  ? Latex Hives  ?  Patient stated that she is allergic to latex  ? ?No current facility-administered medications on file prior to encounter.  ? ?Current Outpatient Medications on Fi

## 2021-12-17 NOTE — ED Notes (Signed)
Pt w/episode of vomiting [place]/green vomit, provider notified. ?

## 2021-12-17 NOTE — Progress Notes (Addendum)
FPTS Interim Progress Note ? ?Received page from the nurse that patient threatening to leave. Went to bedside alongside Dr. Joelyn Oms to discuss with patient. She shares that she wants to leave and her husband will come get her. Per nurse, husband just stepped out for a brief moment and will return. Dr. Joelyn Oms and I then went on to extensively discuss the risks with leaving including death. Patient states that this is why she plans to leave and come right back. I explained that this would be leaving against our best medical recommendation. Upon further evaluation and assessment, patient clearly lacks capacity to clearly understand and report the risks to leaving AMA. Thus patient lacks capacity to appropriately make sound decisions. Will add CIWAs with Ativan. Called husband who agrees with giving her a medication to calm her down if needed. He says that he will be back shortly and plans to stay the night. Situation discussed with husband and he agrees with current plan in place. Ideally would like to keep patient NPO given possible EGD and/or colonoscopy tomorrow but in order to keep patient in the hospital, will add a diet and then NPO at midnight.  ? ?Donney Dice, DO ?12/17/2021, 9:27 PM ?PGY-2, [place] ?Service pager [phone]  ?

## 2021-12-17 NOTE — ED Triage Notes (Signed)
Pt states she doesn't know why she is here.  Husband states pt is here for a psych eval.  States she has a history of alcohol abuse and was clean for awhile.  States she binged drank last week and started detoxing herself last Saturday.  States this morning she was sitting on bed starring at wall and was confused.  She woke him up and he was trying to talk her into not leaving.  He states she left the house later on without him knowing and drove to [place] to her daughter's.  Daughter brought her back.  Pt denies SI/HI.  Eyes jaundice and husband states that is normal for her. ?

## 2021-12-17 NOTE — ED Notes (Signed)
Blood consent signed electronically. ?

## 2021-12-18 ENCOUNTER — Other Ambulatory Visit (HOSPITAL_COMMUNITY): Payer: Self-pay

## 2021-12-18 DIAGNOSIS — K7030 Alcoholic cirrhosis of liver without ascites: Secondary | ICD-10-CM | POA: Diagnosis not present

## 2021-12-18 DIAGNOSIS — K922 Gastrointestinal hemorrhage, unspecified: Secondary | ICD-10-CM

## 2021-12-18 DIAGNOSIS — K7682 Hepatic encephalopathy: Secondary | ICD-10-CM | POA: Diagnosis not present

## 2021-12-18 DIAGNOSIS — D696 Thrombocytopenia, unspecified: Secondary | ICD-10-CM | POA: Diagnosis not present

## 2021-12-18 DIAGNOSIS — D649 Anemia, unspecified: Secondary | ICD-10-CM | POA: Diagnosis not present

## 2021-12-18 LAB — COMPREHENSIVE METABOLIC PANEL
ALT: 18 U/L (ref 0–44)
AST: 62 U/L — ABNORMAL HIGH (ref 15–41)
Albumin: 2.6 g/dL — ABNORMAL LOW (ref 3.5–5.0)
Alkaline Phosphatase: 133 U/L — ABNORMAL HIGH (ref 38–126)
Anion gap: 6 (ref 5–15)
BUN: <5 mg/dL — ABNORMAL LOW (ref 6–20)
CO2: 20 mmol/L — ABNORMAL LOW (ref 22–32)
Calcium: 7.9 mg/dL — ABNORMAL LOW (ref 8.9–10.3)
Chloride: 112 mmol/L — ABNORMAL HIGH (ref 98–111)
Creatinine, Ser: 0.66 mg/dL (ref 0.44–1.00)
GFR, Estimated: >60 mL/min (ref >60)
Glucose, Bld: 130 mg/dL — ABNORMAL HIGH (ref 70–99)
Potassium: 3 mmol/L — ABNORMAL LOW (ref 3.5–5.1)
Sodium: 138 mmol/L (ref 135–145)
Total Bilirubin: 8.4 mg/dL — ABNORMAL HIGH (ref 0.3–1.2)
Total Protein: 8.1 g/dL (ref 6.5–8.1)

## 2021-12-18 LAB — MAGNESIUM: Magnesium: 1.9 mg/dL (ref 1.7–2.4)

## 2021-12-18 LAB — CBC
HCT: 30.2 % — ABNORMAL LOW (ref 36.0–46.0)
Hemoglobin: 9.4 g/dL — ABNORMAL LOW (ref 12.0–15.0)
MCH: 25.3 pg — ABNORMAL LOW (ref 26.0–34.0)
MCHC: 31.1 g/dL (ref 30.0–36.0)
MCV: 81.2 fL (ref 80.0–100.0)
Platelets: 65 10*3/uL — ABNORMAL LOW (ref 150–400)
RBC: 3.72 MIL/uL — ABNORMAL LOW (ref 3.87–5.11)
RDW: 18.9 % — ABNORMAL HIGH (ref 11.5–15.5)
WBC: 6.2 10*3/uL (ref 4.0–10.5)
nRBC: 0.8 % — ABNORMAL HIGH (ref 0.0–0.2)

## 2021-12-18 LAB — HEMOGLOBIN AND HEMATOCRIT, BLOOD
HCT: 33.7 % — ABNORMAL LOW (ref 36.0–46.0)
Hemoglobin: 10.2 g/dL — ABNORMAL LOW (ref 12.0–15.0)

## 2021-12-18 LAB — PROTIME-INR
INR: 1.6 — ABNORMAL HIGH (ref 0.8–1.2)
Prothrombin Time: 19.2 seconds — ABNORMAL HIGH (ref 11.4–15.2)

## 2021-12-18 LAB — HIV ANTIBODY (ROUTINE TESTING W REFLEX): HIV Screen 4th Generation wRfx: NONREACTIVE

## 2021-12-18 MED ORDER — LORAZEPAM 2 MG/ML IJ SOLN
1.0000 mg | INTRAMUSCULAR | Status: DC | PRN
  Administered 2021-12-18 (×3): 2 mg via INTRAVENOUS
  Filled 2021-12-18 (×3): qty 1

## 2021-12-18 MED ORDER — RIFAXIMIN 550 MG PO TABS
550.0000 mg | ORAL_TABLET | Freq: Two times a day (BID) | ORAL | Status: DC
  Administered 2021-12-18 – 2021-12-22 (×7): 550 mg via ORAL
  Filled 2021-12-18 (×9): qty 1

## 2021-12-18 MED ORDER — POTASSIUM CHLORIDE 10 MEQ/100ML IV SOLN
10.0000 meq | INTRAVENOUS | Status: AC
  Administered 2021-12-18 (×6): 10 meq via INTRAVENOUS
  Filled 2021-12-18 (×6): qty 100

## 2021-12-18 MED ORDER — LORAZEPAM 1 MG PO TABS: 1.0000 mg | ORAL_TABLET | ORAL | Status: DC | PRN

## 2021-12-18 NOTE — Consult Note (Addendum)
? ?Referring Provider: Dr. France Ravens ?Primary Care Physician:  Hughie Closs, PA-C ?Primary Gastroenterologist:  Althia Forts. Followed by Roosevelt Locks NP at [facility] ? ?Reason for Consultation: Anemia, cirrhosis ? ?HPI: Joann [name] is a 41 y.o. female with a past medical history significant for alcohol use disorder, alcohol associated cirrhosis, esophageal varices hepatic encephalopathy and alcohol associated pancreatitis.Past cholecystectomy.  ? ?She presented to the ED secondary progressive altered mental status over the past 1 to 2 weeks.  She presented to the ED accompanied by her husband 12/17/2021.  She reported being abstinent from alcohol x 2 weeks.  Previously drank 1/5 of vodka daily.  Labs in the ED showed an ammonia level of 198. Hemoglobin 4.4. Platelets 66.  INR 1.5.  K+ < 2.  Iron 15.  Iron saturation ratios 3.  Ferritin 6.  FOBT negative.  Last alcohol intake was 12/09/2021.  She received KCl IV.  She was transfused 4 units of PRBCs - post transfusion Hg 9.4.  She was placed on PPI and octreotide infusions.  She was started on Rocephin 2 g IV prophylaxis . A GI consult was requested for further evaluation regarding IDA.  ? ?She is somewhat somnolent but arousable.  She answers her questions briefly.  Her husband is at the bedside.  He stated his wife self detox 2 weeks ago.  She demonstrated active vomiting for the first 3 days as she went through alcohol withdrawal.  Emesis consisted of partially digested food and intermittently dark brown liquid.  No frank hematemesis.  No noticeable bloody or black bowel movements.  She passed a loose brown-greenish nonbloody stool earlier this morning.  She has nosebleeds once weekly which last 15 to 20 minutes as reported by her husband.  No NSAIDs.  She underwent an EGD by Dr. During her hospital admission for acute alcohol associated hepatitis with hepatic encephalopathy/2021.  The EGD identified small esophageal varices and large amount of food  residue in the stomach.  She denies ever having a colonoscopy.  No evidence of active GI bleeding at that time. ? ?She has a history of multiple hospital admissions for acute alcohol associated hepatitis with failed long-term abstinence from alcohol.  She underwent multiple alcohol treatment programs in the past which were unsuccessful.  She underwent a liver biopsy October October 2020 as she was noted to have a positive ASMA, negative ANA, negative AMA, and her pattern of liver injury was mixed hepatic/cholestatic. Biopsy was consistent with alcoholic hepatitis, F3 fibrosis, and no histologic evidence of autoimmune liver disease. She is followed by Roosevelt Locks NP at [facility].  However, she has been noncompliant with her follow-up, she no showed numerous appointments.  She was last seen by 02/02/2021, at that time RUQ sonogram was ordered which was completed 02/16/2021 which showed evidence of hepatic cirrhosis without any concerning liver lesion.  She was advised to follow-up in 6 months which was not done. ? ?RUQ sonogram 12/17/2021: ?ULTRASOUND ABDOMEN LIMITED RIGHT UPPER QUADRANT  ?COMPARISON:  02/16/2021  ?FINDINGS: ?Gallbladder:Surgically absent ?  ?Common bile duct:Diameter: 3.1 mm ?  ?Liver:Echogenic nodular hepatic parenchyma consistent with cirrhosis. No ?gross focal hepatic abnormality. Portal vein is patent on color ?Doppler imaging with normal direction of blood flow towards the ?liver. ?  ?Other: Recanalized umbilical vein. ?  ?IMPRESSION: ?Liver cirrhosis with probable portal hypertension  ? ? ?RUQ sonogram 02/16/2021 at [facility]: ?Hepatic cirrhosis. No large focal hepatic abnormality by ultrasound.  ?Remote cholecystectomy  ?  No biliary dilatation ? ?EGD 12/27/2019 by Dr. Benson Norway at [facility]: ?- Small (< 5 mm) esophageal varices. ?- A large amount of food (residue) in the stomach. ?- Normal examined duodenum. ?- No specimens collected. ? ?Past Medical History:  ?Diagnosis  Date  ? Alcohol abuse   ? Anxiety   ? Hypertension   ? Liver disease   ? ? ?Past Surgical History:  ?Procedure Laterality Date  ? ECTOPIC PREGNANCY SURGERY    ? ESOPHAGOGASTRODUODENOSCOPY (EGD) WITH PROPOFOL N/A 12/27/2019  ? Procedure: ESOPHAGOGASTRODUODENOSCOPY (EGD) WITH PROPOFOL;  Surgeon: Carol Ada, MD;  Location: [facility];  Service: Endoscopy;  Laterality: N/A;  ? ? ?Prior to Admission medications   ?Medication Sig Start Date End Date Taking? Authorizing Provider  ?Multiple Vitamin (MULTIVITAMIN WITH MINERALS) TABS tablet Take 1 tablet by mouth daily. 01/15/20  Yes Angiulli, Lavon Paganini, PA-C  ?traZODone (DESYREL) 150 MG tablet Take 150 mg by mouth at bedtime as needed for sleep. 11/05/21  Yes [provider]  ?folic acid (FOLVITE) 1 MG tablet Take 1 tablet (1 mg total) by mouth daily. ?Patient not taking: Reported on 12/17/2021 01/14/20   Angiulli, Lavon Paganini, PA-C  ?lactulose (CHRONULAC) 10 GM/15ML solution Take 45 mLs (30 g total) by mouth every 8 (eight) hours. ?Patient not taking: Reported on 12/17/2021 02/01/20   Izora Ribas, MD  ?magnesium gluconate (MAGONATE) 30 MG tablet Take 1 tablet (30 mg total) by mouth 2 (two) times daily. ?Patient not taking: Reported on 12/17/2021 02/01/20   Izora Ribas, MD  ?midodrine (PROAMATINE) 5 MG tablet Take 1 tablet (5 mg total) by mouth 3 (three) times daily with meals. ?Patient not taking: Reported on 12/17/2021 01/14/20   Angiulli, Lavon Paganini, PA-C  ?ondansetron (ZOFRAN ODT) 4 MG disintegrating tablet Take 1 tablet (4 mg total) by mouth every 8 (eight) hours as needed for nausea or vomiting. ?Patient not taking: Reported on 12/17/2021 07/03/20   Quintella Reichert, MD  ?potassium chloride 20 MEQ TBCR Take 10 mEq by mouth daily. ?Patient not taking: Reported on 12/17/2021 01/16/20   [name], MD  ?prednisoLONE 5 MG TABS tablet Take 8 tablets (40 mg total) by mouth daily. ?Patient not taking: Reported on 12/17/2021 01/15/20   Cathlyn Parsons, PA-C  ?rifaximin  (XIFAXAN) 550 MG TABS tablet Take 1 tablet (550 mg total) by mouth 2 (two) times daily. ?Patient not taking: Reported on 12/17/2021 01/14/20   Angiulli, Lavon Paganini, PA-C  ?traZODone (DESYREL) 50 MG tablet Take 0.5 tablets (25 mg total) by mouth at bedtime. ?Patient not taking: Reported on 12/17/2021 01/14/20   Cathlyn Parsons, PA-C  ?venlafaxine (EFFEXOR) 75 MG tablet Take 1 tablet (75 mg total) by mouth daily. ?Patient not taking: Reported on 12/17/2021 01/14/20   Cathlyn Parsons, PA-C  ?zinc sulfate 220 (50 Zn) MG capsule Take 1 capsule (220 mg total) by mouth daily. ?Patient not taking: Reported on 12/17/2021 01/15/20   Cathlyn Parsons, PA-C  ? ? ?Current Facility-Administered Medications  ?Medication Dose Route Frequency Provider Last Rate Last Admin  ? cefTRIAXone (ROCEPHIN) 2 g in sodium chloride 0.9 % 100 mL IVPB  2 g Intravenous Q24H Eppie Gibson, MD   Stopped at 12/17/21 2011  ? folic acid (FOLVITE) tablet 1 mg  1 mg Oral Daily Eppie Gibson, MD   1 mg at 12/17/21 2037  ? lactulose (CHRONULAC) 10 GM/15ML solution 20 g  20 g Oral TID Eppie Gibson, MD   20 g at 12/18/21  0410  ? LORazepam (ATIVAN) tablet 1-4 mg  1-4 mg Oral Q1H PRN Eppie Gibson, MD   1 mg at 12/17/21 2228  ? Or  ? LORazepam (ATIVAN) injection 1-4 mg  1-4 mg Intravenous Q1H PRN Eppie Gibson, MD      ? multivitamin with minerals tablet 1 tablet  1 tablet Oral Daily Eppie Gibson, MD   1 tablet at 12/17/21 2222  ? octreotide (SANDOSTATIN) 500 mcg in sodium chloride 0.9 % 250 mL (2 mcg/mL) infusion  50 mcg/hr Intravenous Continuous Eppie Gibson, MD 25 mL/hr at 12/18/21 0646 50 mcg/hr at 12/18/21 0646  ? pantoprazole (PROTONIX) injection 40 mg  40 mg Intravenous BID Jim Like B, MD      ? potassium chloride 10 mEq in 100 mL IVPB  10 mEq Intravenous Q1 Hr x 6 France Ravens, MD      ? thiamine tablet 100 mg  100 mg Oral Daily Eppie Gibson, MD   100 mg at 12/17/21 2037  ? traZODone (DESYREL) tablet 150 mg  150 mg Oral QHS PRN  Eppie Gibson, MD   150 mg at 12/17/21 2228  ? ? ?Allergies as of 12/17/2021 - Review Complete 12/17/2021  ?Allergen Reaction Noted  ? Latex Hives 01/08/2020  ? ? ?Family History  ?Problem Relation Age of [place]

## 2021-12-18 NOTE — TOC Benefit Eligibility Note (Signed)
Patient Advocate Encounter ? ?Insurance verification completed.   ? ?The patient is currently admitted and upon discharge could be taking Xifaxan 550 mg tablets. ? ?Requires Prior Authorization ? ?The patient is insured through [place] Medicaid  ? ? ? ?Lyndel Safe, CPhT ?Pharmacy Patient Advocate Specialist ?[facility] Patient Advocate Team ?Direct Number: [phone]  Fax: [phone] ? ? ? ? ? ?  ?

## 2021-12-18 NOTE — ED Notes (Signed)
Potassium is finished. ?

## 2021-12-18 NOTE — ED Notes (Signed)
Potassium marked as done in error. Potassium started at 0330 and is still running. This potassium correlates with the bag stopped at 0337.  ?

## 2021-12-18 NOTE — Progress Notes (Addendum)
Family Medicine Teaching Service ?Daily Progress Note ?Intern Pager: [phone] ? ?Patient name: Joann [name] Medical record number: [id number] ?Date of birth: [birth year] Age: 41 y.o. Gender: female ? ?Primary Care Provider: Hughie Closs, PA-C ?Consultants: GI ?Code Status: Full ? ?Pt Overview and Major Events to Date:  ?4/9 admitted ? ?Assessment and Plan: ?Joann [name] is a 40 year old female presenting with 1 to 2 weeks of progressive confusion and agitation likely secondary to hepatic encephalopathy.  Past medical history significant for alcohol cirrhosis, history of hepatic encephalopathy, EtOH abuse, history of alcoholic pancreatitis, and esophageal varices. ? ?Hepatic encephalopathy ?Alcohol cirrhosis ?Patient is obtunded, unwilling to open eyes, unwilling to follow commands. Hypotensive but afebrile and remainder vitals stable. Did receive ativan and trazodone last evening. CIWA 0.  Last drink approximately 10 days. WBC 6.2 T bili 8.4.  RUQ U/S liver cirrhosis with probable portal hypertension. MELD 20, Child-Pugh Class C. ?-Non-contrast CT for ICH given encephalopathy ?-Continue CIWA w/ ativan ?-Continue 1:1 sitter as patient is fall risk ?-IV ceftriaxone for SBP prophylaxis ?-IV protonix 40 mg bid ?-Lactulose 20 tid, titrate to goal 3-4 bowel movements ?-Xifaxan 550 mg bid ?-TOC consult for substance abuse ?-Thiamine/folate/MVI supplementation ?-PT/OT eval and treat ?-GI consulted, greatly appreciate recommendations ?-Will consider palliative discussion to clarify [place] given poor prognosis ? ?Anemia ?Concern for upper GI bleed ?Thrombocytopenia ?Elevated INR ?Hgb 4.4 > 9.4 s/p 4 u prbc.  ?-GI consult, appreciate recommendations ?-14:00 today H&H ?-Continue octreotide drip for concern for variceal bleed ?-Currently NPO ? ?Hypokalemia ?Hypomagnesia ?K 3.0, Mg 1.9 ?-Replete with another 60 meq Kcl ?-AM BMP ?-AM Mg ? ?Insomnia ?Holding home trazodone ? ?FEN/GI: NPO, sip with meds ?PPx: SCDs ?Dispo:Pending PT  recommendations  in 2-3 days. Barriers include encephalopathy ? ?Subjective:  ?Patient seen and assessed at bedside.  Patient obtunded, minimally responsive.  Withdraws to noxious stimuli ? ?Objective: ?Temp:  [97.4 ?F (36.3 ?C)-98.7 ?F (37.1 ?C)] 98.4 ?F (36.9 ?C) (04/10 0510) ?Pulse Rate:  [64-115] 82 (04/10 0510) ?Resp:  [13-27] 18 (04/10 0510) ?BP: (91-135)/(56-95) 125/89 (04/10 0510) ?SpO2:  [93 %-100 %] 97 % (04/10 0510) ?Physical Exam: ?General: Obtunded, mildly diaphoretic ?Cardiovascular: RRR ?Respiratory: Nonlabored, CTA B ?Abdomen: Bowel sounds all 4 quadrants, passing flatus ?Extremities: Withdraws to noxious stimuli, warm dry ? ?Laboratory: ?Recent Labs  ?Lab 12/17/21 ?1452 12/18/21 ?0636  ?WBC 5.3 6.2  ?HGB 4.4* 9.4*  ?HCT 17.6* 30.2*  ?PLT 66* 65*  ? ?Recent Labs  ?Lab 12/17/21 ?1452 12/18/21 ?0636  ?NA 136 138  ?K 2.0* 3.0*  ?CL 106 112*  ?CO2 23 20*  ?BUN <5* <5*  ?CREATININE 0.78 0.66  ?CALCIUM 8.3* 7.9*  ?PROT 8.6* 8.1  ?BILITOT 4.0* 8.4*  ?ALKPHOS 149* 133*  ?ALT 18 18  ?AST 73* 62*  ?GLUCOSE 103* 130*  ? ? ? ? ?Imaging/Diagnostic Tests: ?US Abdomen Limited RUQ (LIVER/GB) ? ?Result Date: 12/17/2021 ?CLINICAL DATA:  Hepatic encephalopathy EXAM: ULTRASOUND ABDOMEN LIMITED RIGHT UPPER QUADRANT COMPARISON:  02/16/2021 FINDINGS: Gallbladder: Surgically absent Common bile duct: Diameter: 3.1 mm Liver: Echogenic nodular hepatic parenchyma consistent with cirrhosis. No gross focal hepatic abnormality. Portal vein is patent on color Doppler imaging with normal direction of blood flow towards the liver. Other: Recanalized umbilical vein. IMPRESSION: Liver cirrhosis with probable portal hypertension Electronically Signed   By: Donavan Foil M.D.   On: 12/17/2021 22:29   ? ?France Ravens, MD ?12/18/2021, 7:42 AM ?PGY-1, [facility] ?[facility] Intern pager: [phone], text pages welcome ?

## 2021-12-18 NOTE — Progress Notes (Signed)
OT Cancellation Note ? ?Patient Details ?Name: Joann [name] ?MRN: [id number] ?DOB: 1981-02-06 ? ? ?Cancelled Treatment:    Reason Eval/Treat Not Completed: Medical issues which prohibited therapy Per RN, patient agitated, combative, and confused when awoken, requesting therapy let her rest until she is more coherent and participatory. OT will continue to follow.  ? ?Corinne Ports E. Maydell Knoebel, OTR/L ?Acute Rehabilitation Services ?[phone] ?[phone]  ? ?Corinne Ports Rhyland Hinderliter ?12/18/2021, 1:40 PM ?

## 2021-12-18 NOTE — Progress Notes (Addendum)
Family Medicine Teaching Service ?Daily Progress Note ?Intern Pager: [phone] ? ?Patient name: Joann [name] Medical record number: [id number] ?Date of birth: 1980-09-16 Age: 41 y.o. Gender: female ? ?Primary Care Provider: No primary care provider on file. ?Consultants: GI ?Code Status: Full ? ?Pt Overview and Major Events to Date:  ?4/9 admitted ? ?Assessment and Plan: ?Joann [name] is a 41 year old female presenting with 1 to 2 weeks of progressive confusion and agitation likely secondary to hepatic encephalopathy.  Past medical history significant for cirrhosis, hepatic encephalopathy, alcohol abuse, alcoholic pancreatitis, esophageal varices ? ?Hepatic encephalopathy ?Alcohol cirrhosis ?CIWA 15>1.  Received 6 mg ativan yesterday. GI does not suspect alcohol hepatitis, plan for EGD inpatient and colonoscopy outpatient. Very somnolent today. INR 1.6. AST 62>90. Had 3 bowel movements yesterday ?-Discontinue ativan ?-Continue CIWA protocol ?-Safety observation ?-IV ceftriaxone for SBP prophylaxis ?-IV Protonix 40 mg twice daily ?-Continue Lactulose 20 3 times daily, goal 3-4 bowel movements ?-Xifaxan 550 mg twice daily ?-TOC consult for substance abuse ?-Thiamine/folate/multivitamin supplementation ?-PT/OT eval and treat ?-GI consulted, appreciate recommendations ?-Consider palliative discussion to clarify [place] prior to poor prognosis ?-Follow up AFP tumor marker ?-Head CT no ICH. ? ?Anemia ?Concern for upper GI bleed ?Thrombocytopenia ?Elevated INR ?Hgb 9.4 > 10.2 > 10.4. Platelets 65>59. ?-GI following, appreciate recommendations ?-A.m. CBC ?-Continue octreotide drip for concern for variceal bleed ? ?Hypokalemia ?Hypomagnesia ?K 3.3, Mg 1.6 ?-Repleting with mag ox x1, and klorcon packet 40 meq x2 ?-AM BMP ?-AM Mg ? ?Insomnia ?-Continue holding trazodone due to encephalopathy ? ?FEN/GI: Regular diet ?PPx: Lovenox ?Dispo:Pending PT recommendations  in 2-3 days. Barriers include encephalopathy.  ? ?Subjective:  ?Patient  minimally responsive to noxious stimuli.  Patient appears more encephalopathic than yesterday.  May be due to Ativan yesterday. ? ?Objective: ?Temp:  [97.4 ?F (36.3 ?C)-98.7 ?F (37.1 ?C)] 97.6 ?F (36.4 ?C) (04/10 1100) ?Pulse Rate:  [64-115] 95 (04/10 1451) ?Resp:  [13-27] 16 (04/10 1100) ?BP: (91-135)/(56-102) 126/94 (04/10 1451) ?SpO2:  [93 %-100 %] 99 % (04/10 1100) ?Physical Exam: ?General: Obtunded, NAD ?Eye: Initially symmetric downward gaze but became centered symmetrically, minimal blink to threat, jaundice eye, pinpoint pupils ?Cardiovascular: RRR ?Respiratory: CTAB ?Abdomen: soft, nontender ?Extremities: retrieves ? ?Laboratory: ?Recent Labs  ?Lab 12/17/21 ?1452 12/18/21 ?0636  ?WBC 5.3 6.2  ?HGB 4.4* 9.4*  ?HCT 17.6* 30.2*  ?PLT 66* 65*  ? ?Recent Labs  ?Lab 12/17/21 ?1452 12/18/21 ?0636  ?NA 136 138  ?K 2.0* 3.0*  ?CL 106 112*  ?CO2 23 20*  ?BUN <5* <5*  ?CREATININE 0.78 0.66  ?CALCIUM 8.3* 7.9*  ?PROT 8.6* 8.1  ?BILITOT 4.0* 8.4*  ?ALKPHOS 149* 133*  ?ALT 18 18  ?AST 73* 62*  ?GLUCOSE 103* 130*  ? ? ? ?Imaging/Diagnostic Tests: ?No results found. ? ?France Ravens, MD ?12/18/2021, 3:39 PM ?PGY-1, [place] ?[place] Intern pager: [phone], text pages welcome ?

## 2021-12-18 NOTE — Hospital Course (Addendum)
Joann [name] is a 41 y.o.female with a history of alcoholic cirrhosis, hepatic encephalopathy, alcohol abuse, alcoholic pancreatitis, esophageal varices who was admitted to the [facility] Teaching Service at [facility] for progressive confusion and agitation secondary to hepatic encephalopathy. Her hospital course is detailed below: ? ?Hepatic encephalopathy ?Alcohol cirrhosis ?Patient admitted with concerns of hepatic encephalopathy given ammonia of 198.  Patient was started on lactulose 20 3 times daily and Xifaxan 550 mg twice daily was also added with goal of 3-4 loose stools per day.  CIWA protocol with Ativan was also added in the setting of patient had continued to to binge alcohol and unclear last known drink.  Patient was also continued on Rocephin IV for SBP prophylaxis for 5 days.  MELD score 20, Child-Pugh class C.  Given encephalopathy, noncontrast CT head was performed with concern of ICH which showed no bleed.  GI and neurology were consulted given patient's persistent encephalopathy.  Patient's mentation slowly began to improve with lactulose 20 mg 4 times daily and Xifaxan 550 mg twice daily.  Patient was continued on thiamine, folate, magnesium, potassium, multivitamin, and Protonix 40 mg twice daily due to persistent vitamin and electrolyte deficiencies.  Patient is to have outpatient EGD to assess for source of bleeding. ? ?Anemia ?Thrombocytopenia ?Elevated INR ?Patient's hemoglobin on admission was 4.4.  Patient received 4 units packed red blood cells on first night of admission.  This is likely multifactorial to malnutrition, bone marrow suppression, and chronic GI blood loss.  Hemoglobin has been stable around 10.0. ? ?Electrolyte derangement ?Potassium initially less than 2.0 and magnesium 1.6.  Magnesium and potassium were appropriately repleted.  Patient is to be discharged on magnesium oxide tablets and Klor-Con. ? ?Other chronic conditions were medically managed with home medications and  formulary alternatives as necessary (insomnia) ? ?PCP Follow-up Recommendations: ?-CMP to assess liver function, electrolyte derrangements ?-Magnesium level to assess if appropriately repleting ?-Medication compliance and ensuring 3-4 regular bowel movements ?

## 2021-12-18 NOTE — Progress Notes (Signed)
Pt. Arrived via stretcher. Opens eyes to voice. VSS. Follows commands. CCMD notified with NSR on monitor. Bed alarm on with call bell in reach. Orders carried out and will continue to monitor closely. CIWA-0 ?

## 2021-12-18 NOTE — Progress Notes (Signed)
PT Cancellation Note ? ?Patient Details ?Name: Joann [name] ?MRN: [id number] ?DOB: [birth year] ? ? ?Cancelled Treatment:    Reason Eval/Treat Not Completed: Medical issues which prohibited therapy Holding per RN as pt becomes agitated and confused upon waking, wanting to let her rest. Will follow. ? ? ?[place] ?12/18/2021, 1:37 PM ?Marisa Severin, PT, DPT ?Acute Rehabilitation Services ?Secure chat preferred ?Office [phone] ? ? ? ?

## 2021-12-18 NOTE — Progress Notes (Signed)
FPTS Interim Progress Note ? ?S: Went to bedside to check on patient. Patient asleep, I did not wake the patient. Discussed with nurse who denies any concerns. He says that from what he is aware of, patient has been doing well.  ? ?O: ?BP 126/89 (BP Location: Right Arm)   Pulse 65   Temp (!) 96.6 ?F (35.9 ?C)   Resp 17   Ht 5' 4"  (1.626 m)   SpO2 100%   BMI 27.46 kg/m?   ?General: Patient resting comfortably in bed, in no acute distress. ?Resp: normal work of breathing, no signs of respiratory distress ? ?A/P: ?Patient admitted for symptomatic anemia and altered mental status likely secondary to hepatic encephalopathy. Hgb significantly improved to 10.2. Repeat CBC in the morning. Will continue to monitor CIWAs closely, informed nurse to notify primary team if he has any concerns.  ? Donney Dice, DO ?12/18/2021, 8:32 PM ?PGY-2, [place] ?Service pager [phone]  ?

## 2021-12-19 ENCOUNTER — Inpatient Hospital Stay (HOSPITAL_COMMUNITY): Payer: Medicaid Other

## 2021-12-19 DIAGNOSIS — D649 Anemia, unspecified: Secondary | ICD-10-CM | POA: Diagnosis not present

## 2021-12-19 DIAGNOSIS — K7682 Hepatic encephalopathy: Secondary | ICD-10-CM | POA: Diagnosis not present

## 2021-12-19 DIAGNOSIS — D696 Thrombocytopenia, unspecified: Secondary | ICD-10-CM | POA: Diagnosis not present

## 2021-12-19 LAB — CBC
HCT: 34.5 % — ABNORMAL LOW (ref 36.0–46.0)
Hemoglobin: 10.4 g/dL — ABNORMAL LOW (ref 12.0–15.0)
MCH: 24.9 pg — ABNORMAL LOW (ref 26.0–34.0)
MCHC: 30.1 g/dL (ref 30.0–36.0)
MCV: 82.7 fL (ref 80.0–100.0)
Platelets: 59 10*3/uL — ABNORMAL LOW (ref 150–400)
RBC: 4.17 MIL/uL (ref 3.87–5.11)
RDW: 19.8 % — ABNORMAL HIGH (ref 11.5–15.5)
WBC: 6.1 10*3/uL (ref 4.0–10.5)
nRBC: 0.7 % — ABNORMAL HIGH (ref 0.0–0.2)

## 2021-12-19 LAB — COMPREHENSIVE METABOLIC PANEL
ALT: 19 U/L (ref 0–44)
AST: 90 U/L — ABNORMAL HIGH (ref 15–41)
Albumin: 2.7 g/dL — ABNORMAL LOW (ref 3.5–5.0)
Alkaline Phosphatase: 130 U/L — ABNORMAL HIGH (ref 38–126)
Anion gap: 5 (ref 5–15)
BUN: <5 mg/dL — ABNORMAL LOW (ref 6–20)
CO2: 18 mmol/L — ABNORMAL LOW (ref 22–32)
Calcium: 8.4 mg/dL — ABNORMAL LOW (ref 8.9–10.3)
Chloride: 116 mmol/L — ABNORMAL HIGH (ref 98–111)
Creatinine, Ser: 0.65 mg/dL (ref 0.44–1.00)
GFR, Estimated: >60 mL/min (ref >60)
Glucose, Bld: 117 mg/dL — ABNORMAL HIGH (ref 70–99)
Potassium: 3.3 mmol/L — ABNORMAL LOW (ref 3.5–5.1)
Sodium: 139 mmol/L (ref 135–145)
Total Bilirubin: 5.1 mg/dL — ABNORMAL HIGH (ref 0.3–1.2)
Total Protein: 8 g/dL (ref 6.5–8.1)

## 2021-12-19 LAB — BPAM RBC
Blood Product Expiration Date: 202305052359
Blood Product Expiration Date: 202305062359
Blood Product Expiration Date: 202305062359
Blood Product Expiration Date: 202305062359
ISSUE DATE / TIME: 202304091853
ISSUE DATE / TIME: 202304092143
ISSUE DATE / TIME: 202304100019
ISSUE DATE / TIME: 202304100233
Unit Type and Rh: 5100
Unit Type and Rh: 5100
Unit Type and Rh: 5100
Unit Type and Rh: 5100

## 2021-12-19 LAB — TYPE AND SCREEN
ABO/RH(D): O POS
Antibody Screen: NEGATIVE
Unit division: 0
Unit division: 0
Unit division: 0
Unit division: 0

## 2021-12-19 LAB — PROTIME-INR
INR: 1.6 — ABNORMAL HIGH (ref 0.8–1.2)
Prothrombin Time: 19.1 seconds — ABNORMAL HIGH (ref 11.4–15.2)

## 2021-12-19 LAB — MAGNESIUM: Magnesium: 1.6 mg/dL — ABNORMAL LOW (ref 1.7–2.4)

## 2021-12-19 MED ORDER — LORAZEPAM 2 MG/ML IJ SOLN
0.5000 mg | Freq: Once | INTRAMUSCULAR | Status: AC
  Administered 2021-12-19: 0.5 mg via INTRAVENOUS
  Filled 2021-12-19: qty 1

## 2021-12-19 MED ORDER — THIAMINE HCL 100 MG/ML IJ SOLN
100.0000 mg | Freq: Every day | INTRAMUSCULAR | Status: DC
Start: 2021-12-27 — End: 2021-12-22

## 2021-12-19 MED ORDER — FOLIC ACID 5 MG/ML IJ SOLN
1.0000 mg | Freq: Every day | INTRAMUSCULAR | Status: DC
  Administered 2021-12-19 – 2021-12-21 (×3): 1 mg via INTRAVENOUS
  Filled 2021-12-19 (×4): qty 0.2

## 2021-12-19 MED ORDER — POTASSIUM CHLORIDE 10 MEQ/100ML IV SOLN: 10.0000 meq | INTRAVENOUS | Status: DC

## 2021-12-19 MED ORDER — MAGNESIUM SULFATE 2 GM/50ML IV SOLN
2.0000 g | Freq: Once | INTRAVENOUS | Status: AC
  Administered 2021-12-19: 2 g via INTRAVENOUS
  Filled 2021-12-19: qty 50

## 2021-12-19 MED ORDER — POTASSIUM CHLORIDE 20 MEQ PO PACK
40.0000 meq | PACK | ORAL | Status: AC
  Administered 2021-12-19 (×2): 40 meq via ORAL
  Filled 2021-12-19 (×2): qty 2

## 2021-12-19 MED ORDER — LACTULOSE ENEMA
300.0000 mL | Freq: Four times a day (QID) | ORAL | Status: DC
  Filled 2021-12-19 (×13): qty 300

## 2021-12-19 MED ORDER — THIAMINE HCL 100 MG/ML IJ SOLN
100.0000 mg | Freq: Every day | INTRAMUSCULAR | Status: DC
Start: 2021-12-19 — End: 2021-12-20
  Administered 2021-12-19 – 2021-12-20 (×2): 100 mg via INTRAVENOUS
  Filled 2021-12-19 (×2): qty 2

## 2021-12-19 MED ORDER — SODIUM CHLORIDE 0.9 % IV SOLN
250.0000 mg | Freq: Every day | INTRAVENOUS | Status: DC
  Administered 2021-12-21 – 2021-12-22 (×2): 250 mg via INTRAVENOUS
  Filled 2021-12-19 (×2): qty 2.5

## 2021-12-19 MED ORDER — LACTULOSE 10 GM/15ML PO SOLN: 20.0000 g | Freq: Three times a day (TID) | ORAL | Status: DC

## 2021-12-19 MED ORDER — THIAMINE HCL 100 MG/ML IJ SOLN
500.0000 mg | Freq: Three times a day (TID) | INTRAVENOUS | Status: AC
  Administered 2021-12-19 – 2021-12-21 (×6): 500 mg via INTRAVENOUS
  Filled 2021-12-19 (×6): qty 5

## 2021-12-19 MED ORDER — LACTULOSE 10 GM/15ML PO SOLN
20.0000 g | Freq: Four times a day (QID) | ORAL | Status: DC
  Administered 2021-12-19 – 2021-12-22 (×11): 20 g via ORAL
  Filled 2021-12-19 (×11): qty 30

## 2021-12-19 MED ORDER — LACTULOSE ENEMA
300.0000 mL | Freq: Three times a day (TID) | ORAL | Status: DC
  Administered 2021-12-19: 300 mL via RECTAL
  Filled 2021-12-19 (×4): qty 300

## 2021-12-19 NOTE — Progress Notes (Signed)
FPTS Brief Progress Note ? ?S: Messaged by GI due to concerns of progressive somnolence.  Patient has not received any Ativan since 16:15 yesterday.  Patient continues to be very difficult to arouse.  Patient is able to wake up just enough to say 1 word sentences on occasion.  ? ? ?O: ?BP 90/62 (BP Location: Right Arm)   Pulse 70   Temp (!) 96.7 ?F (35.9 ?C) (Axillary)   Resp 18   Ht 5' 4"  (1.626 m)   Wt 69.6 kg   SpO2 97%   BMI 26.34 kg/m?   ?Obtunded. Eyes are jaundiced with downward gaze that progressively center.  Minimally withdraws to noxious stimuli in upper extremities but less so in lower.  ? ?A/P: ?-Repeat ammonia level per GI. ?-Neurology consulted. Recommended EEG. Thiamine increased to 500 mg q8 hour.  ?- Orders reviewed. Labs for AM ordered, which was adjusted as needed.  ?- If condition changes, plan includes bedside evaluation.  ? ?France Ravens, MD ?12/19/2021, 1:42 PM ?PGY-1, [place] Medicine Resident  ?Please page [phone] with questions.  ? ?

## 2021-12-19 NOTE — Progress Notes (Signed)
Patient is difficult to arouse and sleeping most of the day. She did wake up when mother came and was crying but did not speak. RN was able to get her to take am medications orally. Lactulose enema given and patient had watery brown stool. Patient did attempt to assist RN and NT to roll when providing personal care.Lab was unable to get labwork this afternoon. Attending physician is aware.  Fuller Canada, RN ? ?

## 2021-12-19 NOTE — Progress Notes (Signed)
Mobility Specialist Progress Note ? ? 12/19/21 1206  ?Mobility  ?Activity Contraindicated/medical hold  ? ?Pt inappropriate for mobility specialist at this time given level of complexity, physical assist, and/or precautions as advised by PT/OT team. Mobility specialist to hold today, will continue to follow for readiness. ? ?Holland Falling ?Mobility Specialist ?Phone Number [phone] ? ?

## 2021-12-19 NOTE — Progress Notes (Signed)
FPTS Interim Progress Note ? ?S:Went to bedside to assess patient. She was trying to get out of bed, multiple staff at bedside as well. Husband not at bedside. Discussed with nursing team who said that day nurse was in her room for 7/12 hours of the day during day shift. The nurse says that she has been consistently tried to get out of bed despite multiple attempts at redirection. Has continuous EEG monitoring in place. I discussed with patient that we want her to stay in bed, she said she wanted something to drink and I adjusted her bed tray. As soon as staff leaves, patient then attempts to get out of bed while I am speaking with the nursing team. We return in the room and she is redirected. Laying comfortably and then attempted to get out again.  ? ?O: ?BP 120/73 (BP Location: Right Arm)   Pulse 92   Temp 97.6 ?F (36.4 ?C) (Axillary)   Resp 19   Ht 5' 4"  (1.626 m)   Wt 69.6 kg   SpO2 97%   BMI 26.34 kg/m?   ?General: Patient sitting upright in bed, in no acute distress. ?Resp: normal work of breathing ?Psych: anxious appearing, very mildly agitated  ? ?A/P: ?In spite of multiple attempts of redirection, bed alarm and telesitter, patient still attempting to get out of bed. Ordered sitter but notified from nurse that patient still trying to get out of bed and unable to get a sitter tonight so ordered for soft restraints which is indicated so patient can avoid falls and stay safe as multiple other attempts have failed thus far. She is able to take her lactulose orally thus far. Will continue to monitor closely, I would like to avoid Ativan if possible given somnolence after getting 6 mg ativan on 4/10 but may consider very small dose of 0.5 mg if appropriate and attempts continue to fail. Discussed plan extensively with nurse. Instructed to notify primary team if any concerns. Also discussed with plan with on-call attending, Dr. Gwendlyn Deutscher, who agrees with current plan in place as well. Continue EEG and further  neurology recommendations.  ? ? ?Donney Dice, DO ?12/19/2021, 8:50 PM ?PGY-2, [place] ?Service pager [phone]  ?

## 2021-12-19 NOTE — Progress Notes (Signed)
FPTS Interim Progress Note ? ?S:Went to check on patient at bedside, she was sleeping comfortably. I did not wake the patient. Discussed with nurse, he says that she woke up briefly to take her medication and resumed back to sleep shortly after. ? ?O: ?BP 118/85 (BP Location: Right Arm)   Pulse 60   Temp (!) 96.2 ?F (35.7 ?C) (Axillary)   Resp 15   Ht 5' 4"  (1.626 m)   SpO2 98%   BMI 27.46 kg/m?   ?General: Patient sleeping comfortably, in no acute distress. ?Resp: normal work of breathing ?Psych: mood appropriate  ? ?A/P: ?Patient received total of 6 mg of ativan during the day per CIWA protocol. Recent CIWA is 1, no agitation or anxiety noted. Will continue to monitor alcohol withdrawal symptoms with CIWAs. Hgb 4.4 upon admission and improved after 4pRBCs. Plan to recheck CBC 5am. Vitals, orders and telemetry reviewed. Remainder of plan per day team.  ? Donney Dice, DO ?12/19/2021, 12:38 AM ?PGY-2, [place] ?Service pager [phone]  ?

## 2021-12-19 NOTE — Evaluation (Signed)
Occupational Therapy Evaluation ?Patient Details ?Name: Joann [name] ?MRN: [id number] ?DOB: 1980/11/22 ?Today's Date: 12/19/2021 ? ? ?History of Present Illness Patient is a 41 yo female presenting to the ED with AMS. Admitted with severe anemia, hepatic encephalopathy, and hypokalemia. PMH is significant for alcoholic cirrhosis, history of hepatic encephalopathy, etOH abuse, history of alcoholic pancreatitis and esophageal varices.  ? ?Clinical Impression ?  ?PT admitted with AMS. Pt currently with functional limitiations due to the deficits listed below (see OT problem list). Pt currently verbalized only once "no" to a question. Pt otherwise very dependent on therapist. Pt with truck righting reaction showing engagement to the task.  Pt will benefit from skilled OT to increase their independence and safety with adls and balance to allow discharge drug rehab per mother. OT to further evaluate needs next session.  ?  ?   ? ?Recommendations for follow up therapy are one component of a multi-disciplinary discharge planning process, led by the attending physician.  Recommendations may be updated based on patient status, additional functional criteria and insurance authorization.  ? ?Follow Up Recommendations ? Other (comment) (rehab for substance abuse per mother)  ?  ?Assistance Recommended at Discharge Frequent or constant Supervision/Assistance  ?Patient can return home with the following Two people to help with walking and/or transfers;Two people to help with bathing/dressing/bathroom;Assistance with cooking/housework;Assistance with feeding;Direct supervision/assist for medications management;Direct supervision/assist for financial management;Assist for transportation ? ?  ?Functional Status Assessment ? Patient has had a recent decline in their functional status and demonstrates the ability to make significant improvements in function in a reasonable and predictable amount of time.  ?Equipment Recommendations ?  BSC/3in1;Other (comment) (TBA)  ?  ?Recommendations for Other Services Other (comment) (drug rehab) ? ? ?  ?Precautions / Restrictions Precautions ?Precautions: Fall ?Restrictions ?Weight Bearing Restrictions: No  ? ?  ? ?Mobility Bed Mobility ?Overal bed mobility: Needs Assistance ?Bed Mobility: Supine to Sit ?  ?  ?Supine to sit: Total assist, +2 for physical assistance, HOB elevated ?  ?  ?General bed mobility comments: Helicoptor technique using pad to get pt to EOB, no assist from pt. Forward flexed posture. ?  ? ?Transfers ?  ?  ?  ?  ?  ?  ?  ?  ?  ?General transfer comment: Deferred due to arousal. ?  ? ?  ?Balance Overall balance assessment: Needs assistance ?Sitting-balance support: Feet supported, No upper extremity supported ?Sitting balance-Leahy Scale: Zero ?Sitting balance - Comments: Max A initially progressing to moments of Min-mod A. Able to prevent LOB posteriorly by activating core when removing support but not sustain static balance. ?Postural control: Posterior lean, Left lateral lean, Other (comment) (LOB ina lld irections) ?  ?  ?  ?  ?  ?  ?  ?  ?  ?  ?  ?  ?  ?  ?   ? ?ADL either performed or assessed with clinical judgement  ? ?ADL Overall ADL's : Needs assistance/impaired ?  ?  ?  ?  ?  ?  ?  ?  ?  ?  ?  ?  ?  ?  ?  ?  ?  ?  ?  ?General ADL Comments: total (A) for all at this time. Rn reports once up to [facility] this admission but did not demonstrate the ability to transfer safely during this session  ? ? ? ?Vision   ?Additional Comments: difficult to assess with functional task or visual assessment  ?   ?  Perception   ?  ?Praxis   ?  ? ?Pertinent Vitals/Pain Pain Assessment ?Pain Assessment: No/denies pain ?Faces Pain Scale: No hurt  ? ? ? ?Hand Dominance Right ?  ?Extremity/Trunk Assessment Upper Extremity Assessment ?Upper Extremity Assessment: Generalized weakness ?  ?Lower Extremity Assessment ?Lower Extremity Assessment: Defer to PT evaluation ?  ?Cervical / Trunk Assessment ?Cervical /  Trunk Assessment: Other exceptions ?Cervical / Trunk Exceptions: Heavy head in flexed position, not able to hold it up. ?  ?Communication Communication ?Communication: No difficulties ?  ?Cognition Arousal/Alertness: Lethargic ?Behavior During Therapy: Flat affect ?Overall Cognitive Status: Difficult to assess ?  ?  ?  ?  ?  ?  ?  ?  ?  ?  ?  ?  ?  ?  ?  ?  ?General Comments: Pt barely opening eyes during session despite stimulus of wet wash cloth, sitting upright, verbal etc. Not following any commands.pt does report "no" once in response to question. Mother present and reports speaking to her saying "what momma? " ?  ?  ?General Comments  mother present and giving all information. reports that previous attemps to get her to go to rehab were unsuccessful and this time spouse will get a court order ? ?  ?Exercises   ?  ?Shoulder Instructions    ? ? ?Home Living Family/patient expects to be discharged to:: Private residence ?Living Arrangements: Spouse/significant other ?Available Help at Discharge: Family ?Type of Home: House ?Home Access: Level entry ?  ?  ?Home Layout: Two level;Able to live on main level with bedroom/bathroom ?  ?  ?Bathroom Shower/Tub: Walk-in shower ?  ?Bathroom Toilet: Standard ?  ?  ?  ?  ?Additional Comments: Lives with spouse and 68 yo son, 2 dogs, mother present fro [place] visiting . Older brother that lives with grandmother is back home taking care of 72 yo. There is a daughter that is 48 yo ?  ? ?  ?Prior Functioning/Environment Prior Level of Function : Independent/Modified Independent ?  ?  ?  ?  ?  ?  ?Mobility Comments: Working on/off, spouse drives a truck so away for work at times. Drinks a lot per mother ?ADLs Comments: independent ?  ? ?  ?  ?OT Problem List: Decreased strength;Decreased range of motion;Decreased activity tolerance;Impaired balance (sitting and/or standing);Impaired vision/perception;Decreased coordination;Decreased cognition;Decreased safety awareness;Decreased  knowledge of use of DME or AE;Decreased knowledge of precautions;Cardiopulmonary status limiting activity;Impaired sensation ?  ?   ?OT Treatment/Interventions: Self-care/ADL training;Therapeutic exercise;Energy conservation;DME and/or AE instruction;Manual therapy;Modalities;Therapeutic activities;Cognitive remediation/compensation;Visual/perceptual remediation/compensation;Patient/family education;Balance training  ?  ?OT Goals(Current goals can be found in the care plan section) Acute Rehab OT Goals ?Patient Stated Goal: none stated ?OT Goal Formulation: Patient unable to participate in goal setting ?Time For Goal Achievement: 01/02/22 ?Potential to Achieve Goals: Good  ?OT Frequency: Min 2X/week ?  ? ?Co-evaluation PT/OT/SLP Co-Evaluation/Treatment: Yes ?Reason for Co-Treatment: Complexity of the patient's impairments (multi-system involvement) ?  ?OT goals addressed during session: ADL's and self-care ?  ? ?  ?AM-PAC OT "6 Clicks" Daily Activity     ?Outcome Measure Help from another person eating meals?: Total ?Help from another person taking care of personal grooming?: Total ?Help from another person toileting, which includes using toliet, bedpan, or urinal?: Total ?Help from another person bathing (including washing, rinsing, drying)?: Total ?Help from another person to put on and taking off regular upper body clothing?: Total ?Help from another person to put on and taking off regular lower body  clothing?: Total ?6 Click Score: 6 ?  ?End of Session Nurse Communication: Mobility status;Precautions ? ?Activity Tolerance: Patient limited by lethargy ?Patient left: in bed;with call bell/phone within reach;with bed alarm set;with family/visitor present ? ?OT Visit Diagnosis: Muscle weakness (generalized) (M62.81)  ?              ?Time: 9024-0973 ?OT Time Calculation (min): 17 min ?Charges:  OT General Charges ?$OT Visit: 1 Visit ?OT Evaluation ?$OT Eval Moderate Complexity: 1 Mod ? ? ?[place], OTR/L  ?Acute  Rehabilitation Services ?Pager: [phone] ?Office: [phone] ?. ? ? ?Jeri Modena ?12/19/2021, 3:18 PM ?

## 2021-12-19 NOTE — Progress Notes (Signed)
LTM EEG hooked up and running - no initial skin breakdown - push button tested - neuro notified. [facility] monitoring. ? ?---MRI COMPATIBLE ELECTRODES USED FOR THIS HOOKUP ? ?

## 2021-12-19 NOTE — Consult Note (Signed)
NEUROLOGY CONSULTATION NOTE  ? ?Date of service: December 19, 2021 ?Patient Name: Joann [name] ?MRN:  [id number] ?DOB:  18-Feb-1981 ?Reason for consult: "AMS, persistent somnolence" ?Requesting Provider: Martyn Malay, MD ?_ _ _   _ __   _ __ _ _  __ __   _ __   __ _ ? ?History of Present Illness  ?Joann [name] is a 41 y.o. female with PMH significant for alcohol use complicated by alcoholic cirrhosis, esophageal varices, known hepatic encephalopathy, hypertension who presents with worsening confusion and somnolence. ? ?Patient is unable to provide any meaningful history and most of further history is obtained from chart review ? ?Appears that patient presents with 1 to 2-week history of increasing confusion, agitation along with jaundice and scleral icterus. ? ?She was noted to be encephalopathic, oriented to self but was unable to articulate to provide any meaningful history.  She was given Ativan and trazodone and her somnolence was initially attributed to medication.  She also has elevated ammonia to 198 and that is likely contribution to this 2.  However, patient was noted to have worsening somnolence despite holding any further Ativan and other sedating medications and so we were asked to evaluate her.   ? ?She has had work-up so far with CT head without contrast with no acute intracranial abnormalities and stable compared to her prior CT head from 2 years ago. ? ?She is not noted to have any abnormal movement or activity concerning for seizures.  Repeat ammonia level is pending.  GI team is planning on endoscopy.  Neurology was consulted on this patient for further evaluation for persistent somnolence and altered mental status. ?  ?ROS  ? ?Unable to obtain detailed review of systems secondary to somnolence. ? ?Past History  ? ?Past Medical History:  ?Diagnosis Date  ? Alcohol abuse   ? Anxiety   ? Hypertension   ? Liver disease   ? ?Past Surgical History:  ?Procedure Laterality Date  ? ECTOPIC PREGNANCY SURGERY     ? ESOPHAGOGASTRODUODENOSCOPY (EGD) WITH PROPOFOL N/A 12/27/2019  ? Procedure: ESOPHAGOGASTRODUODENOSCOPY (EGD) WITH PROPOFOL;  Surgeon: Carol Ada, MD;  Location: [facility];  Service: Endoscopy;  Laterality: N/A;  ? ?Family History  ?Problem Relation Age of Onset  ? Healthy Mother   ? Alcohol abuse Mother   ?     quit in her 86s  ? Alcohol abuse Father   ? Diabetes Father   ? Healthy Brother   ? Hypertension Maternal Grandmother   ? Hypertension Maternal Grandfather   ? Healthy Brother   ? Healthy Brother   ? ?Social History  ? ?Socioeconomic History  ? Marital status: Married  ?  Spouse name: Not on file  ? Number of children: Not on file  ? Years of education: Not on file  ? Highest education level: Not on file  ?Occupational History  ? Occupation: nurse  ?Tobacco Use  ? Smoking status: Never  ? Smokeless tobacco: Never  ?Vaping Use  ? Vaping Use: Never used  ?Substance and Sexual Activity  ? Alcohol use: Yes  ?  Comment: "I just do it"  ? Drug use: Never  ? Sexual activity: Yes  ?Other Topics Concern  ? Not on file  ?Social History Narrative  ? Not on file  ? ?Social Determinants of Health  ? ?Financial Resource Strain: Not on file  ?Food Insecurity: Not on file  ?Transportation Needs: Not on file  ?Physical Activity: Not on file  ?  Stress: Not on file  ?Social Connections: Not on file  ? ?Allergies  ?Allergen Reactions  ? Latex Hives  ?  Patient stated that she is allergic to latex  ? ? ?Medications  ? ?Medications Prior to Admission  ?Medication Sig Dispense Refill Last Dose  ? Multiple Vitamin (MULTIVITAMIN WITH MINERALS) TABS tablet Take 1 tablet by mouth daily.   12/13/2021  ? traZODone (DESYREL) 150 MG tablet Take 150 mg by mouth at bedtime as needed for sleep.   12/12/2021  ? folic acid (FOLVITE) 1 MG tablet Take 1 tablet (1 mg total) by mouth daily. (Patient not taking: Reported on 12/17/2021) 30 tablet 0 Not Taking  ? lactulose (CHRONULAC) 10 GM/15ML solution Take 45 mLs (30 g total) by mouth every 8  (eight) hours. (Patient not taking: Reported on 12/17/2021) 236 mL 3 Not Taking  ? magnesium gluconate (MAGONATE) 30 MG tablet Take 1 tablet (30 mg total) by mouth 2 (two) times daily. (Patient not taking: Reported on 12/17/2021) 60 tablet 3 Not Taking  ? midodrine (PROAMATINE) 5 MG tablet Take 1 tablet (5 mg total) by mouth 3 (three) times daily with meals. (Patient not taking: Reported on 12/17/2021) 90 tablet 0 Not Taking  ? ondansetron (ZOFRAN ODT) 4 MG disintegrating tablet Take 1 tablet (4 mg total) by mouth every 8 (eight) hours as needed for nausea or vomiting. (Patient not taking: Reported on 12/17/2021) 10 tablet 0 Not Taking  ? potassium chloride 20 MEQ TBCR Take 10 mEq by mouth daily. (Patient not taking: Reported on 12/17/2021) 30 tablet 1 Not Taking  ? prednisoLONE 5 MG TABS tablet Take 8 tablets (40 mg total) by mouth daily. (Patient not taking: Reported on 12/17/2021) 13 tablet 0 Not Taking  ? rifaximin (XIFAXAN) 550 MG TABS tablet Take 1 tablet (550 mg total) by mouth 2 (two) times daily. (Patient not taking: Reported on 12/17/2021) 42 tablet 0 Not Taking  ? traZODone (DESYREL) 50 MG tablet Take 0.5 tablets (25 mg total) by mouth at bedtime. (Patient not taking: Reported on 12/17/2021) 30 tablet 0 Not Taking  ? venlafaxine (EFFEXOR) 75 MG tablet Take 1 tablet (75 mg total) by mouth daily. (Patient not taking: Reported on 12/17/2021) 30 tablet 0 Not Taking  ? zinc sulfate 220 (50 Zn) MG capsule Take 1 capsule (220 mg total) by mouth daily. (Patient not taking: Reported on 12/17/2021) 30 capsule 0 Not Taking  ?  ? ?Vitals  ? ?Vitals:  ? 12/19/21 0400 12/19/21 0440 12/19/21 0715 12/19/21 1448  ?BP: 109/71  90/62 120/73  ?Pulse: 60  70 92  ?Resp: 15  18 19   ?Temp: (!) 96.6 ?F (35.9 ?C)  (!) 96.7 ?F (35.9 ?C) 97.6 ?F (36.4 ?C)  ?TempSrc: Axillary  Axillary Axillary  ?SpO2: 99%  97% 97%  ?Weight:  69.6 kg    ?Height:      ?  ? ?Body mass index is 26.34 kg/m?. ? ?Physical Exam  ? ?General: Laying comfortably in bed; in no  acute distress.  Repositions herself several times in the bed. ?HENT: Normal oropharynx and mucosa. Normal external appearance of ears and nose. ?Neck: Supple, no pain or tenderness ?CV: No JVD. No peripheral edema. ?Pulmonary: Symmetric Chest rise. Normal respiratory effort. ?Abdomen: Soft to touch, non-tender. ?Ext: No cyanosis, edema, or deformity ?Skin: No rash. Normal palpation of skin.  ?Musculoskeletal: Normal digits and nails by inspection. No clubbing. ? ?Neurologic Examination  ?Mental status/Cognition: opens eyes partially and briefly to loud clap. Doesnot follow commands,  does not answer any orientation questions. Acknowledges questions occasionally with a "yes" but would not follow commands. ? ? ?Speech/language: no dysarthria, non fluent. Somnolence precludes assessment of speech. ? ?Cranial nerves:  ? CN II Pupils equal and reactive to light, no VF deficits  ? CN III,IV,VI EOM intact to dolls eyes, downwards gaze, no nystagmus.  ? CN V Corneals intact BL, shuts her eyes closed tight  ? CN VII no asymmetry, no nasolabial fold flattening. Symmetric facial grimace.  ? CN VIII Acknowledges speech, seems to get frustrated agitated when I talk to her louder.  ? CN IX & X normal palatal elevation, no uvular deviation   ? CN XI Head seems to rest towards her right.  ? CN XII midline tongue but does not protrude on command.  ? ?Motor:  ?Muscle bulk: poor, tone normal ? ?Unable to do detailed strength testing secondary to somnolence.  She moves all of her extremities spontaneously and semipurposefully.  Attempts to reposition herself several times in the bed.  When her bilateral upper extremities are held off the bed, they drift down to the bed in a couple seconds.  She spontaneously moves bilateral lower extremities and will withdraws bilateral lower extremities to Babinski's. ? ?Reflexes: ? Right Left Comments  ?Pectoralis     ? Biceps (C5/6) 2+ 2+   ?Brachioradialis (C5/6) 2+ 2+   ? Triceps (C6/7) 2+ 2+   ?  Patellar (L3/4) 3 3   ? Achilles (S1) 4 4 3-4 beats of clonus BL  ? Hoffman + +   ? Plantar     ?Jaw jerk   ? ?Sensation: ? Light touch Regards touch in all extremities.  ? Pin prick   ? Temperature   ? [place]ra

## 2021-12-19 NOTE — Progress Notes (Signed)
FPTS Interim Progress Note ? ?S:Went to beside to evaluate patient, she was sitting on the side of the bed and again attempting to get out. She had a bowel movement on the bed, posey in place at the waist but patient altering this and still able to move to the end of the bed in attempt to stand up. Urine noted on the floor. Then when asked what is wrong, patient starts cursing and yells but calms down soon after still appearing anxious. She is also pulling out her EEG leads. ? ?O: ?BP 120/73 (BP Location: Right Arm)   Pulse 92   Temp 97.6 ?F (36.4 ?C) (Axillary)   Resp 19   Ht 5' 4"  (1.626 m)   Wt 69.6 kg   SpO2 97%   BMI 26.34 kg/m?   ?General: Patient sitting on the side of the bed, posey in place, in no acute or apparent distress. ?Resp: normal work of breathing ?Psych: anxious appearing with mild agitation ? ?A/P: ?Discussed plan extensively with nursing staff, unable to get sitter and she is still able to nearly get out of the bed in the posey with failed multiple attempts of redirection so ordered Ativan 0.5 mg to help with symptoms and avoid interrupting continuous EEG monitoring. Instructed nurse to touch base with EEG technician to ensure leads are appropriately placed. Will continue to monitor closely, would like to avoid further Ativan administration unless necessary.  ? Donney Dice, DO ?12/19/2021, 10:30 PM ?PGY-2, [place] ?Service pager [phone]  ?

## 2021-12-19 NOTE — Progress Notes (Signed)
EEG complete - results pending 

## 2021-12-19 NOTE — Progress Notes (Signed)
FPTS Interim Progress Note ? ?S: Patient is reporting that she would like to eat, she has no other complaints at this time.  Husband is at bedside and states that he is wanting to help feed her. ? ?O: ?BP 120/73 (BP Location: Right Arm)   Pulse 92   Temp 97.6 ?F (36.4 ?C) (Axillary)   Resp 19   Ht 5' 4"  (1.626 m)   Wt 69.6 kg   SpO2 97%   BMI 26.34 kg/m?   ?General: NAD, sitting up in bed, talking when asked questions, EEG on head ?Respiratory: able to speak in full sentences, no increased WOB ?CV: RRR on telemetry ? ?A/P: ?Hepatic encephalopathy ?Improved from last check-up earlier this afternoon. Patient is wanting to eat and is able to state that. Will place a diet for now with close monitoring.  ?- Regular diet ordered ? ?Rise Patience, DO ?12/19/2021, 6:34 PM ?PGY-2, [place] ?Service pager [phone] ? ?

## 2021-12-19 NOTE — Progress Notes (Addendum)
? ? ? ?[facility] Progress Note ? ?CC:  Anemia, cirrhosis ? ?Subjective:  She will not open her eyes.  She does not follow simple commands.  No family at the bedside.  Her RN confirmed she was awake earlier enough today to swallow lactulose p.o. and Xifaxan.  She passed two light brown nonbloody bowel movements earlier this morning.  No family at the bedside. ? ? ?Objective:  ?Vital signs in last 24 hours: ?Temp:  [96.2 ?F (35.7 ?C)-97.6 ?F (36.4 ?C)] 96.7 ?F (35.9 ?C) (04/11 0715) ?Pulse Rate:  [60-111] 70 (04/11 0715) ?Resp:  [15-20] 18 (04/11 0715) ?BP: (90-127)/(62-102) 90/62 (04/11 0715) ?SpO2:  [97 %-100 %] 97 % (04/11 0715) ?Weight:  [69.6 kg] 69.6 kg (04/11 0440) ?Last BM Date : 12/18/21 ?General: Unarousable/obtunded. Hemodynamically stable. ?Heart: Regular rate and rhythm, no murmurs. ?Pulm: Breath sounds clear, decreased in the bases. ?Abdomen: Soft, nondistended.  No obvious tenderness with deep palpation. ?Extremities:  Without edema. ?Neurologic: She is nonconversant.  Unresponsive.  She will not open her eyes.  Manually lifted eyelids, pupils with downward gaze She does not follow simple commands.  Slight movement of her upper extremities when abdomen palpated deeply. ?Psych:  Alert and cooperative. Normal mood and affect. ? ?Intake/Output from previous day: ?04/10 0701 - 04/11 0700 ?In: 818.3 [P.O.:240; IV Piggyback:578.3] ?Out: 1225 [Urine:1225] ?Intake/Output this shift: ?No intake/output data recorded. ? ?Lab Results: ?Recent Labs  ?  12/17/21 ?1452 12/18/21 ?0636 12/18/21 ?1540  ?WBC 5.3 6.2  --   ?HGB 4.4* 9.4* 10.2*  ?HCT 17.6* 30.2* 33.7*  ?PLT 66* 65*  --   ? ?BMET ?Recent Labs  ?  12/17/21 ?1452 12/18/21 ?0636  ?NA 136 138  ?K 2.0* 3.0*  ?CL 106 112*  ?CO2 23 20*  ?GLUCOSE 103* 130*  ?BUN <5* <5*  ?CREATININE 0.78 0.66  ?CALCIUM 8.3* 7.9*  ? ?LFT ?Recent Labs  ?  12/18/21 ?0636  ?PROT 8.1  ?ALBUMIN 2.6*  ?AST 62*  ?ALT 18  ?ALKPHOS 133*  ?BILITOT 8.4*  ? ?PT/INR ?Recent Labs   ?  12/17/21 ?1705 12/18/21 ?0636  ?LABPROT 18.4* 19.2*  ?INR 1.5* 1.6*  ? ?Hepatitis Panel ?No results for input(s): HEPBSAG, HCVAB, HEPAIGM, HEPBIGM in the last 72 hours. ? ?US Abdomen Limited RUQ (LIVER/GB) ? ?Result Date: 12/17/2021 ?CLINICAL DATA:  Hepatic encephalopathy EXAM: ULTRASOUND ABDOMEN LIMITED RIGHT UPPER QUADRANT COMPARISON:  02/16/2021 FINDINGS: Gallbladder: Surgically absent Common bile duct: Diameter: 3.1 mm Liver: Echogenic nodular hepatic parenchyma consistent with cirrhosis. No gross focal hepatic abnormality. Portal vein is patent on color Doppler imaging with normal direction of blood flow towards the liver. Other: Recanalized umbilical vein. IMPRESSION: Liver cirrhosis with probable portal hypertension Electronically Signed   By: Donavan Foil M.D.   On: 12/17/2021 22:29   ? ?Assessment / Plan: ? ?34) 41 year old female with alcohol associated cirrhosis, esophageal varices and hepatic encephalopathy.  RUQ sono consistent with cirrhosis, patent portal vein without evidence of any concerning liver lesion/hepatoma.  Total bili 8.4 -> 5.1.  Alk phos 133 -> 130.  AST 62 -> 90.  ALT 18 -> 19.  MELD 20. She is not a liver transplant candidate secondary to ongoing alcohol use. MDF 35.5.  Patient is unresponsive at this time.  Hemodynamically stable.  She received oral lactulose and Xifaxan po this morning as confirmed by her dayshift RN. ?-I contacted the medical service for a stat evaluation and to consider ICU transfer as the patient appears obtunded, requires close neuro checks ?-Stat  ammonia level ?-Stat Lactulose enema now, will likely require lactulose enemas every 4 to 6 hours until mental status improves  ?-Continue Xifaxan 550 mg p.o. twice daily  ?-Continue to hold Ativan ?-Continue Rocephin  IV prophylaxis for now ?-Continue thiamine, folic acid and multivitamin daily ?-Ondansetron 4 mg p.o./IV every 6 hours as needed ?-CBC, BMP, hepatic panel, AFP and INR in am ?  ?2) Iron deficiency  anemia, likely due to epistaxis and bone marrow suppression secondary to alcohol use.  No overt GI bleeding.  FOBT negative. Admission Hg 4.4 -> Transfused 4 units of PRBCs -> 9.4 -> today Hg 10.4.  On Octreotide infusion ?-Continue Pantoprazole 40 mg IV twice daily ?-Transfuse for hemoglobin < 7 ?-Consider IV iron  ?-Consider repeat EGD to reassess EV during this hospital admission with neuro status stable ?-Eventual colonoscopy, likely as an outpatient ?  ?3) Thrombocytopenia. PLT 59.  ?-CBC in am ?  ?4) Coagulopathy secondary to cirrhosis. INR 1.6. ?-Daily INR level ? ?5) Hypokalemia. K+ 3.3.  ?-KCL IV replacement per the hospitalist ? ?Principal Problem: ?  Symptomatic anemia ?Active Problems: ?  Hepatic encephalopathy ([place]) ? ? ? ? LOS: 2 days  ? ?Noralyn Pick  12/19/2021, 1:31 PM ? ?I have taken a history, reviewed the chart and examined the patient. I performed a substantive portion of this encounter, including complete performance of at least one of the key components, in conjunction with the APP. I agree with the APP's note, impression and recommendations ? ? ?When I evaluated the patient around 2 pm the patient was arousable and spontaneously moving and shifting in bed, sometimes sitting up, but would not follow any commands or answer any questions.  Her vitals were reassuring and her breathing was regular.  She was about to receive the lactulose enema at that time.  I returned around 5:30 pm and the patient was sitting up in bed and appeared much more alert.  She was still not coherent and was unable to answer any questions (when asked what her name was she would reply "9/27").  She was noted to move all extremities but some of her movements were not smooth/jerky.  She could not follow commands to assess asterixis and passive asterixis was not observed. ? ?Although I suspect hepatic encephalopathy aggravated by benzodiazepines as the primary etiology for her altered mental status/somnolence, I  agree with the neurology consultation and assessment for other etiologies, given the clinical deterioration. ?Continue q4 lactulose, either PO or via enema until somnolence resolves, then titrate to 3 Bms per day ?Continue Rifaximin BID ?Avoid sedatives/benzodiazepines ?Trend ammonia level.  If normalizing without improvement in mental status, this may suggest additional etiologies ? ?Her clinical picture is not consistent with alcoholic hepatitis and I do not recommend steroids. ?Her hemoglobin has been stable since her transfusion and she has not demonstrated any symptoms of active GI bleeding.   ?Stop octreotide tomorrow if no evidence of active GI bleed. ?Continue BID PPI and antibiotics for now ?Likely EGD once patient's mental status improves. ? ?Launi Asencio E. Candis Schatz, MD ?[facility] ? ? ?

## 2021-12-19 NOTE — Progress Notes (Addendum)
Initial Nutrition Assessment ? ?DOCUMENTATION CODES:  ? ?Not applicable ? ?INTERVENTION:  ? ?Monitor for diet advancement; supplement once advanced ? ?If unable to advance diet due to encephalopathy recommend cortrak tube placement and initiation of enteral nutrition support.  ?Osmolite 1.5 @ 20 ml/hr and increase by 10 ml every 8 hours to goal rate of 50 ml/hr with 45 ml ProSource TF TID ?Provides: 1920 kcal, 108 grams protein, and 912 ml free water - pt at risk of refeeding.  ? ?Pt at risk for micronutrient deficiencies; labs ordered ?Vitamin C, Vitamin A, Zinc, and Vitamin B6 ? ? ?NUTRITION DIAGNOSIS:  ? ?Increased nutrient needs related to chronic illness (cirrhosis) as evidenced by estimated needs. ? ?GOAL:  ? ?Patient will meet greater than or equal to 90% of their needs ? ?MONITOR:  ? ?Diet advancement ? ?REASON FOR ASSESSMENT:  ? ?Consult ?Assessment of nutrition requirement/status ? ?ASSESSMENT:  ? ?Pt with PMH of ETOH abuse, cirrhosis, hepatic encephalopathy, esophageal varices, and alcohol associated pancreatitis admitted with progressive AMS x 1-2 weeks - dx with hepatic encephalopathy and likely alcohol withdrawal. Pt abstinent from alcohol x 2 weeks, previously drank 1/5th of vodka daily. Pt is followed by liver clinic but is noncompliant. Pt profoundly anemic on admission.  ? ?GI consulted, pt profoundly anemic without overt GI bleeding. Receiving transfusions. No plans for EGD at this time due to poor mental status. Per RN report pt was alert but became combative and was given ativan.  ? ?Medications reviewed and include: folic acid, lactulose, MVI with minerals, protonix, thiamine  ?Octreotide ? ?Labs reviewed: K: 3 ?Iron: 15 ?Ferritin 6 ?Vitamin B12: 586 ?Ammonia: 198 ?  ? ?Diet Order:   ?Diet Order   ? ?       ?  Diet NPO time specified  Diet effective midnight       ?  ? ?  ?  ? ?  ? ? ?EDUCATION NEEDS:  ? ?Not appropriate for education at this time ? ?Skin:  Skin Assessment: Reviewed RN  Assessment ? ?Last BM:  4/10 ? ?Height:  ? ?Ht Readings from Last 1 Encounters:  ?12/18/21 5' 4"  (1.626 m)  ? ? ?Weight:  ? ?Wt Readings from Last 1 Encounters:  ?12/19/21 69.6 kg  ? ? ?BMI:  Body mass index is 26.34 kg/m?. ? ?Estimated Nutritional Needs:  ? ?Kcal:  1900-2100 ? ?Protein:  100-115 grams ? ?Fluid:  >1.9 L/day ? ?Lockie Pares., RD, LDN, CNSC ?See [facility] for contact information  ? ?

## 2021-12-19 NOTE — Progress Notes (Signed)
Patient is more alert. She asked RN for a blanket. She also took her 1800 oral dose of lactulose with no issue. She is currently sitting upright in her bed with tv on in no acute distress. Fuller Canada, RN ? ?

## 2021-12-19 NOTE — Evaluation (Signed)
Physical Therapy Evaluation ?Patient Details ?Name: Joann [name] ?MRN: [id number] ?DOB: 06/20/1981 ?Today's Date: 12/19/2021 ? ?History of Present Illness ? Patient is a 41 yo female presenting to the ED with AMS. Admitted with severe anemia, hepatic encephalopathy, and hypokalemia. PMH is significant for alcoholic cirrhosis, history of hepatic encephalopathy, etOH abuse, history of alcoholic pancreatitis and esophageal varices.  ?Clinical Impression ? Mobility assessment limited due to decreased level of arousal. Barely able to open eyes with stimulus of wet, cold wash cloth, sitting EOB, verbal/tactile stimulus/cues. Total A of 2 to get to EOB using chuck pad. Not following any commands today. Noted to have some righting reactions sitting EOB requiring anywhere from max A to Min-mod A. Pt lives at home with her spouse and 42 y/o son and has been working on/off per mother. Her spouse drives a truck for work so is out of town for periods. Pt's mother would like pt to d/c to an alcohol rehab facility at discharge. Will follow acutely to progress mobility once arousal has improved to help determine any DME/disposition needs.  ?   ? ?Recommendations for follow up therapy are one component of a multi-disciplinary discharge planning process, led by the attending physician.  Recommendations may be updated based on patient status, additional functional criteria and insurance authorization. ? ?Follow Up Recommendations Other (comment) (plan for alcohol rehab facility per mother) ? ?  ?Assistance Recommended at Discharge Intermittent Supervision/Assistance  ?Patient can return home with the following ? A little help with walking and/or transfers;A little help with bathing/dressing/bathroom;Assistance with cooking/housework;Direct supervision/assist for financial management;Assist for transportation;Help with stairs or ramp for entrance;Direct supervision/assist for medications management ? ?  ?Equipment Recommendations None  recommended by PT  ?Recommendations for Other Services ?    ?  ?Functional Status Assessment Patient has had a recent decline in their functional status and demonstrates the ability to make significant improvements in function in a reasonable and predictable amount of time.  ? ?  ?Precautions / Restrictions Precautions ?Precautions: Fall ?Restrictions ?Weight Bearing Restrictions: No  ? ?  ? ?Mobility ? Bed Mobility ?Overal bed mobility: Needs Assistance ?Bed Mobility: Supine to Sit ?  ?  ?Supine to sit: Total assist, +2 for physical assistance, HOB elevated ?  ?  ?General bed mobility comments: Helicoptor technique using pad to get pt to EOB, no assist from pt. Forward flexed posture. ?  ? ?Transfers ?  ?  ?  ?  ?  ?  ?  ?  ?  ?General transfer comment: Deferred due to arousal. ?  ? ?Ambulation/Gait ?  ?  ?  ?  ?  ?  ?  ?  ? ?Stairs ?  ?  ?  ?  ?  ? ?Wheelchair Mobility ?  ? ?Modified Rankin (Stroke Patients Only) ?  ? ?  ? ?Balance Overall balance assessment: Needs assistance ?Sitting-balance support: Feet supported, No upper extremity supported ?Sitting balance-Leahy Scale: Zero ?Sitting balance - Comments: Max A initially progressing to moments of Min-mod A. Able to prevent LOB posteriorly by activating core when removing support but not sustain static balance. ?Postural control: Posterior lean, Left lateral lean, Other (comment) (LOB ina lld irections) ?  ?  ?  ?  ?  ?  ?  ?  ?  ?  ?  ?  ?  ?  ?   ? ? ? ?Pertinent Vitals/Pain Pain Assessment ?Pain Assessment: Faces ?Faces Pain Scale: No hurt  ? ? ?Home Living Family/patient expects to  be discharged to:: Private residence ?Living Arrangements: Spouse/significant other ?Available Help at Discharge: Family ?Type of Home: House ?Home Access: Level entry ?  ?  ?  ?Home Layout: Two level;Able to live on main level with bedroom/bathroom ?  ?Additional Comments: Lives with spouse and 47 yo son, 2 dogs, mother present fro [place] visiting . Older brother that lives with  grandmother is back home taking care of 57 yo. There is a daughter that is 15 yo  ?  ?Prior Function Prior Level of Function : Independent/Modified Independent ?  ?  ?  ?  ?  ?  ?Mobility Comments: Working on/off, spouse drives a truck so away for work at times. Drinks a lot per mother ?ADLs Comments: independent ?  ? ? ?Hand Dominance  ? Dominant Hand: Right ? ?  ?Extremity/Trunk Assessment  ? Upper Extremity Assessment ?Upper Extremity Assessment: Defer to OT evaluation;Difficult to assess due to impaired cognition ?  ? ?Lower Extremity Assessment ?Lower Extremity Assessment: Difficult to assess due to impaired cognition ?  ? ?Cervical / Trunk Assessment ?Cervical / Trunk Assessment: Other exceptions ?Cervical / Trunk Exceptions: Heavy head in flexed position, not able to hold it up.  ?Communication  ?    ?Cognition Arousal/Alertness: Lethargic ?Behavior During Therapy: Flat affect ?Overall Cognitive Status: Difficult to assess ?  ?  ?  ?  ?  ?  ?  ?  ?  ?  ?  ?  ?  ?  ?  ?  ?General Comments: Pt barely opening eyes during session despite stimulus of wet wash cloth, sitting upright, verbal etc. Not following any commands. ?  ?  ? ?  ?General Comments General comments (skin integrity, edema, etc.): Mother present during session and providing PLOF/history and interested in getting daughter into alcohol rehab at d/c. ? ?  ?Exercises    ? ?Assessment/Plan  ?  ?PT Assessment Patient needs continued PT services  ?PT Problem List Decreased strength;Decreased mobility;Decreased safety awareness;Decreased balance;Decreased activity tolerance;Decreased cognition ? ?   ?  ?PT Treatment Interventions Gait training;DME instruction;Stair training;Patient/family education;Therapeutic activities;Functional mobility training;Cognitive remediation;Neuromuscular re-education;Balance training;Therapeutic exercise   ? ?PT Goals (Current goals can be found in the Care Plan section)  ?Acute Rehab PT Goals ?Patient Stated Goal: none  stated, per mother to send daughter to alcohol rehab center ?PT Goal Formulation: With family ?Time For Goal Achievement: 01/02/22 ?Potential to Achieve Goals: Fair ? ?  ?Frequency Min 3X/week ?  ? ? ?Co-evaluation PT/OT/SLP Co-Evaluation/Treatment: Yes ?Reason for Co-Treatment: Complexity of the patient's impairments (multi-system involvement);Necessary to address cognition/behavior during functional activity;To address functional/ADL transfers ?  ?  ?  ? ? ?  ?AM-PAC PT "6 Clicks" Mobility  ?Outcome Measure Help needed turning from your back to your side while in a flat bed without using bedrails?: Total ?Help needed moving from lying on your back to sitting on the side of a flat bed without using bedrails?: Total ?Help needed moving to and from a bed to a chair (including a wheelchair)?: Total ?Help needed standing up from a chair using your arms (e.g., wheelchair or bedside chair)?: Total ?Help needed to walk in hospital room?: Total ?Help needed climbing 3-5 steps with a railing? : Total ?6 Click Score: 6 ? ?  ?End of Session   ?Activity Tolerance: Patient limited by lethargy ?Patient left: in bed;with call bell/phone within reach;with bed alarm set;with family/visitor present ?Nurse Communication: Mobility status ?PT Visit Diagnosis: Muscle weakness (generalized) (M62.81) ?  ? ?Time:  2202-5427 ?PT Time Calculation (min) (ACUTE ONLY): 17 min ? ? ?Charges:   PT Evaluation ?$PT Eval Moderate Complexity: 1 Mod ?  ?  ?   ? ? ?Marisa Severin, PT, DPT ?Acute Rehabilitation Services ?Secure chat preferred ?Office [phone] ? ? ? ? ?[place] ?12/19/2021, 1:47 PM ? ?

## 2021-12-20 ENCOUNTER — Other Ambulatory Visit (HOSPITAL_COMMUNITY): Payer: Self-pay

## 2021-12-20 DIAGNOSIS — R4182 Altered mental status, unspecified: Secondary | ICD-10-CM | POA: Diagnosis not present

## 2021-12-20 DIAGNOSIS — D649 Anemia, unspecified: Secondary | ICD-10-CM | POA: Diagnosis not present

## 2021-12-20 LAB — COMPREHENSIVE METABOLIC PANEL
ALT: 21 U/L (ref 0–44)
AST: 82 U/L — ABNORMAL HIGH (ref 15–41)
Albumin: 3 g/dL — ABNORMAL LOW (ref 3.5–5.0)
Alkaline Phosphatase: 143 U/L — ABNORMAL HIGH (ref 38–126)
Anion gap: 7 (ref 5–15)
BUN: <5 mg/dL — ABNORMAL LOW (ref 6–20)
CO2: 17 mmol/L — ABNORMAL LOW (ref 22–32)
Calcium: 8.6 mg/dL — ABNORMAL LOW (ref 8.9–10.3)
Chloride: 117 mmol/L — ABNORMAL HIGH (ref 98–111)
Creatinine, Ser: 0.68 mg/dL (ref 0.44–1.00)
GFR, Estimated: >60 mL/min (ref >60)
Glucose, Bld: 135 mg/dL — ABNORMAL HIGH (ref 70–99)
Potassium: 3.8 mmol/L (ref 3.5–5.1)
Sodium: 141 mmol/L (ref 135–145)
Total Bilirubin: 4.6 mg/dL — ABNORMAL HIGH (ref 0.3–1.2)
Total Protein: 9.1 g/dL — ABNORMAL HIGH (ref 6.5–8.1)

## 2021-12-20 LAB — CBC
HCT: 34.4 % — ABNORMAL LOW (ref 36.0–46.0)
Hemoglobin: 10.1 g/dL — ABNORMAL LOW (ref 12.0–15.0)
MCH: 24.6 pg — ABNORMAL LOW (ref 26.0–34.0)
MCHC: 29.4 g/dL — ABNORMAL LOW (ref 30.0–36.0)
MCV: 83.9 fL (ref 80.0–100.0)
Platelets: 85 10*3/uL — ABNORMAL LOW (ref 150–400)
RBC: 4.1 MIL/uL (ref 3.87–5.11)
RDW: 20.5 % — ABNORMAL HIGH (ref 11.5–15.5)
WBC: 10.2 10*3/uL (ref 4.0–10.5)
nRBC: 0.3 % — ABNORMAL HIGH (ref 0.0–0.2)

## 2021-12-20 LAB — VITAMIN B12: Vitamin B-12: 914 pg/mL (ref 180–914)

## 2021-12-20 LAB — AMMONIA: Ammonia: 146 umol/L — ABNORMAL HIGH (ref 9–35)

## 2021-12-20 LAB — TSH: TSH: 0.146 u[IU]/mL — ABNORMAL LOW (ref 0.350–4.500)

## 2021-12-20 LAB — FOLATE: Folate: 9.7 ng/mL (ref >5.9)

## 2021-12-20 LAB — AFP TUMOR MARKER: AFP, Serum, Tumor Marker: 5.4 ng/mL (ref 0.0–6.4)

## 2021-12-20 LAB — MAGNESIUM: Magnesium: 1.5 mg/dL — ABNORMAL LOW (ref 1.7–2.4)

## 2021-12-20 MED ORDER — LORAZEPAM 2 MG/ML IJ SOLN
0.5000 mg | Freq: Once | INTRAMUSCULAR | Status: AC
  Administered 2021-12-20: 0.5 mg via INTRAVENOUS
  Filled 2021-12-20: qty 1

## 2021-12-20 MED ORDER — MAGNESIUM SULFATE 2 GM/50ML IV SOLN
2.0000 g | Freq: Once | INTRAVENOUS | Status: AC
  Administered 2021-12-20: 2 g via INTRAVENOUS
  Filled 2021-12-20: qty 50

## 2021-12-20 MED ORDER — POTASSIUM CHLORIDE CRYS ER 20 MEQ PO TBCR
40.0000 meq | EXTENDED_RELEASE_TABLET | Freq: Once | ORAL | Status: AC
  Administered 2021-12-20: 40 meq via ORAL
  Filled 2021-12-20: qty 2

## 2021-12-20 NOTE — Progress Notes (Signed)
LTM maint complete - no skin breakdown under:  Fp1 F3 F4 ?Reapplied several leads ?[facility] monitored, Event button test confirmed by [facility]. ? ?

## 2021-12-20 NOTE — Progress Notes (Signed)
FPTS Interim Progress Note ? ?S:Received page from nurse regarding patient yelling. Went to bedside to assess patient. Patient had to be moved to another room in order to not disturb other patients. Upon my arrival patient was sitting on the bed, posey in place. She asks for another popsicle which I said we can give to her. She then starts yelling, " I need to get out of here" and cursing. Able to speak with both day and night shift nurse. Apparently patient has been like this for the past 3 hours. She is anxious appearing, mildly agitated and not redirectable.  ? ?O: ?BP 120/72 (BP Location: Left Arm)   Pulse 77   Temp 97.7 ?F (36.5 ?C) (Oral)   Resp 20   Ht 5' 4"  (1.626 m)   Wt 69.6 kg   SpO2 100%   BMI 26.34 kg/m?   ? ? ?A/P: ?Discussed plan with nursing staff including night shift nurse, sitter ordered but may not be able to have tonight due to staffing shortage. Given unable to be redirected upon multiple attempts, disruptive and fall risk, ordered Ativan 0.5 mg. Instructed floor nurses to contact primary team for any further assistance with redirection and we can assess to see other interventions that may be appropriate. May also consider calling husband to speak with patient to help calm her down. Will continue to closely monitor.  ? Donney Dice, DO ?12/20/2021, 8:07 PM ?PGY-2, [facility] ?Service pager [phone]  ?

## 2021-12-20 NOTE — Progress Notes (Signed)
Late entry ?Notified by RN that patient was climbing out of bed, not redirectable, and interfering with EEG leads. Similar to behavior earlier in the night and last night. Patient responded well to 0.57m Ativan earlier in the evening and already has a tele-sitter present in the room.  ?- Another 0.514mAtivan ordered ?BaPearla DubonnetMD ? ?

## 2021-12-20 NOTE — TOC Benefit Eligibility Note (Signed)
Patient Advocate Encounter ? ?Prior Authorization for Xifaxan 550 mg tablets has been approved.   ? ? ?Effective dates: 12/20/2021 through 12/21/2022 ? ?Patients co-pay is $4.00.  ? ? ? ?Lyndel Safe, CPhT ?Pharmacy Patient Advocate Specialist ?[facility] Patient Advocate Team ?Direct Number: [phone]  Fax: [phone]  ?

## 2021-12-20 NOTE — Plan of Care (Signed)
?  Problem: Education: ?Goal: Knowledge of General Education information will improve ?Description: Including pain rating scale, medication(s)/side effects and non-pharmacologic comfort measures ?Outcome: Progressing ?  ?Problem: Clinical Measurements: ?Goal: Ability to maintain clinical measurements within normal limits will improve ?Outcome: Progressing ?Goal: Will remain free from infection ?Outcome: Progressing ?Goal: Diagnostic test results will improve ?Outcome: Progressing ?Goal: Respiratory complications will improve ?Outcome: Progressing ?Goal: Cardiovascular complication will be avoided ?Outcome: Progressing ?  ?Problem: Activity: ?Goal: Risk for activity intolerance will decrease ?Outcome: Progressing ?  ?Problem: Nutrition: ?Goal: Adequate nutrition will be maintained ?Outcome: Progressing ?  ?Problem: Skin Integrity: ?Goal: Risk for impaired skin integrity will decrease ?Outcome: Progressing ?  ?Problem: Safety: ?Goal: Non-violent Restraint(s) ?Outcome: Progressing ?  ?

## 2021-12-20 NOTE — Progress Notes (Signed)
Patient had already removed EEG leads - unable to assess skin as Nurse Tech said patient is finally calmed down. Tech left acetone with Nurse Tech and instructed her on how leads are removed. If needed she may call [phone].  ?

## 2021-12-20 NOTE — Progress Notes (Addendum)
? ? ? ?[facility] Progress Note ? ?CC:   Anemia, cirrhosis ? ?Subjective: She is arousable today. Answers no to most  questions. She stated " I want to eat".  No family at the bedside.  Aaron Edelman RN confirm she has taking her lactulose and Xifaxan orally without any difficulty.  She has passed 2 small volume light brown/green bowel movements thus far today. ? ?She became significantly agitated, trying to climb out of bed, she received Ativan 0.5 mg grams IV around 3:30 AM. ? ?Objective:  ?Vital signs in last 24 hours: ?Temp:  [97.6 ?F (36.4 ?C)-97.7 ?F (36.5 ?C)] 97.6 ?F (36.4 ?C) (04/12 0720) ?Pulse Rate:  [73-104] 73 (04/12 0720) ?Resp:  [17-23] 23 (04/12 0800) ?BP: (111-138)/(65-98) 138/80 (04/12 1210) ?SpO2:  [94 %-98 %] 98 % (04/12 0720) ?Last BM Date : 12/19/21 ?General: Sleeping but arousable, in no acute distress. ?Eyes: Mild scleral icterus present.  ?Heart: Regular rate and rhythm, no murmurs. ?Pulm: Breath sounds clear, decreased in the bases. ?Abdomen: Soft, nondistended.  Nontender.  No ascites.  No palpable mass.  Positive bowel sounds all 4 quadrants. ?Extremities:  Without edema. ?Neurologic:  Alert and  oriented x 4. Grossly normal neurologically. ?Psych:  Alert and cooperative. Normal mood and affect. ? ?Intake/Output from previous day: ?04/11 0701 - 04/12 0700 ?In: 380 [P.O.:280; IV Piggyback:100] ?Out: 600 [Urine:600] ?Intake/Output this shift: ?Total I/O ?In: 240 [P.O.:240] ?Out: -  ? ?Lab Results: ?Recent Labs  ?  12/18/21 ?0636 12/18/21 ?1540 12/19/21 ?6948 12/20/21 ?5462  ?WBC 6.2  --  6.1 10.2  ?HGB 9.4* 10.2* 10.4* 10.1*  ?HCT 30.2* 33.7* 34.5* 34.4*  ?PLT 65*  --  59* 85*  ? ?BMET ?Recent Labs  ?  12/18/21 ?0636 12/19/21 ?7035 12/20/21 ?0093  ?NA 138 139 141  ?K 3.0* 3.3* 3.8  ?CL 112* 116* 117*  ?CO2 20* 18* 17*  ?GLUCOSE 130* 117* 135*  ?BUN <5* <5* <5*  ?CREATININE 0.66 0.65 0.68  ?CALCIUM 7.9* 8.4* 8.6*  ? ?LFT ?Recent Labs  ?  12/20/21 ?0652  ?PROT 9.1*  ?ALBUMIN 3.0*  ?AST  82*  ?ALT 21  ?ALKPHOS 143*  ?BILITOT 4.6*  ? ?PT/INR ?Recent Labs  ?  12/18/21 ?0636 12/19/21 ?0840  ?LABPROT 19.2* 19.1*  ?INR 1.6* 1.6*  ? ?Hepatitis Panel ?No results for input(s): HEPBSAG, HCVAB, HEPAIGM, HEPBIGM in the last 72 hours. ? ?CT HEAD WO CONTRAST (5MM) ? ?Result Date: 12/19/2021 ?CLINICAL DATA:  Mental status change, unknown cause. EXAM: CT HEAD WITHOUT CONTRAST TECHNIQUE: Contiguous axial images were obtained from the base of the skull through the vertex without intravenous contrast. RADIATION DOSE REDUCTION: This exam was performed according to the departmental dose-optimization program which includes automated exposure control, adjustment of the mA and/or kV according to patient size and/or use of iterative reconstruction technique. COMPARISON:  Head CT 12/30/2019 FINDINGS: Brain: There is no evidence of an acute infarct, intracranial hemorrhage, mass, midline shift, or extra-axial fluid collection. Cerebral volume is borderline low for age. The ventricles are unchanged in size. Vascular: No hyperdense vessel. Skull: No fracture or suspicious osseous lesion. Sinuses/Orbits: Visualized paranasal sinuses and mastoid air cells are clear. Unremarkable orbits. Other: None. IMPRESSION: No evidence of acute intracranial abnormality. Electronically Signed   By: Logan Bores M.D.   On: 12/19/2021 09:55  ? ?EEG adult ? ?Result Date: 12/20/2021 ?Lora Havens, MD     12/20/2021  8:17 AM Patient Name: Joann [name] MRN: [id number] Epilepsy Attending: Lora Havens Referring  Physician/Provider: France Ravens, MD Date: 12/19/2021 Duration: 22.28 mins Patient history: 41 y.o. female with PMH significant for alcohol use complicated by alcoholic cirrhosis, esophageal varices, known hepatic encephalopathy, hypertension who presents with worsening confusion and somnolence. EEG to evaluate for seizure Level of alertness: lethargic AEDs during EEG study: None Technical aspects: This EEG study was done with scalp  electrodes positioned according to the 10-20 International system of electrode placement. Electrical activity was acquired at a sampling rate of 500Hz  and reviewed with a high frequency filter of 70Hz  and a low frequency filter of 1Hz . EEG data were recorded continuously and digitally stored. Description: EEG showed continuous generalized rhythmic 2 to 3 Hz delta slowing. Hyperventilation and photic stimulation were not performed.   ABNORMALITY -Generalized rhythmic delta activity IMPRESSION: This study is suggestive of moderate to severe diffuse encephalopathy, nonspecific etiology.  No seizures or epileptiform discharges were seen throughout the recording. Priyanka Barbra Sarks  ? ?Overnight EEG with video ? ?Result Date: 12/20/2021 ?Lora Havens, MD     12/20/2021 12:15 PM Patient Name: Joann [name] MRN: [id number] Epilepsy Attending: Lora Havens Referring Physician/Provider: Donnetta Simpers, MD Duration: 12/19/2021 1707 to 12/20/2021 1117  Patient history: 41 y.o. female with PMH significant for alcohol use complicated by alcoholic cirrhosis, esophageal varices, known hepatic encephalopathy, hypertension who presents with worsening confusion and somnolence. EEG to evaluate for seizure  Level of alertness: awake/lethargic, asleep  AEDs during EEG study: None  Technical aspects: This EEG study was done with scalp electrodes positioned according to the 10-20 International system of electrode placement. Electrical activity was acquired at a sampling rate of 500Hz  and reviewed with a high frequency filter of 70Hz  and a low frequency filter of 1Hz . EEG data were recorded continuously and digitally stored.  Description: During awake state, no clear posterior dominant rhythm was seen. Sleep was characterized by sleep spindles (12-14Hz ), maximal frontocentral region.  EEG showed continuous generalized rhythmic 2 to 3 Hz delta slowing. Hyperventilation and photic stimulation were not performed.    ABNORMALITY  -Generalized rhythmic delta activity  IMPRESSION: This study is suggestive of moderate to severe diffuse encephalopathy, nonspecific etiology.  No seizures or epileptiform discharges were seen throughout the recording. Priyanka Barbra Sarks   ? ?Assessment / Plan: ? ?58) 41 year old female with alcohol associated cirrhosis, esophageal varices and hepatic encephalopathy.  RUQ sono consistent with cirrhosis, patent portal vein without evidence of any concerning liver lesion/hepatoma.  Total bili 8.4 -> 5.1 -> 4.6.  Alk phos 143. AST 82.  ALT 21.  MELD 20. She is not a liver transplant candidate secondary to ongoing alcohol use. MDF 35.5.  Progressive hepatic encephalopathy yesterday. Head CT was negative.  EEG showed moderate to severe diffuse encephalopathy without evidence of seizures.  She was evaluated by neurology who assessed her cognitive decline was likely due to hyperammonemia. Ammonia level 146.  She received a lactulose enema x 1 then oral lactulose was increased to every 6 hours and high dose Thiamine.  She remains a somewhat somnolent today but she is more arousable.  ?-Continue Xifaxan 550 mg p.o. twice daily  ?-Continue Lactulose 20 g p.o. every 6 hours (lactulose enema if she does not tolerate po), titrate to no more than 3-4 loose bowel movements daily ?-Continue to hold Ativan ?-Continue Rocephin  IV prophylaxis for now ?-Continue thiamine, folic acid and multivitamin daily ?-Ondansetron 4 mg p.o./IV every 6 hours as needed ?-2 gm low sodium diet as tolerated  ?  ?2) Iron deficiency anemia,  likely due to epistaxis and bone marrow suppression secondary to alcohol use.  No overt GI bleeding.  FOBT negative. Admission Hg 4.4 -> Transfused 4 units of PRBCs -> 9.4 -> Hg 10.4 -> 10.1.  Octreotide was discontinued. ?-Continue Pantoprazole 40 mg IV twice daily ?-Transfuse for hemoglobin < 7 ?-Consider repeat EGD to reassess EV during this hospital admission with neuro status stable ?-Eventual colonoscopy, likely  as an outpatient ?  ?3) Thrombocytopenia. PLT 85.  ?  ?4) Coagulopathy secondary to cirrhosis. INR 1.6. ?  ?5) Hypokalemia. K+ 3.3.  ?-KCL IV replacement per the hospitalist ?  ? ?Principal Problem: ?  Symptomatic anem

## 2021-12-20 NOTE — Progress Notes (Signed)
PT Cancellation Note ? ?Patient Details ?Name: Joann [name] ?MRN: [id number] ?DOB: 12-Jul-1981 ? ? ?Cancelled Treatment:    Reason Eval/Treat Not Completed: (P) Medical issues which prohibited therapy (RN defer due to pt increased agitation when awake. Pt yelling, attempting to scoot OOB despite posey belt restraint in bed, not following commands appropriately for participation in functional mobility tasks.) Attempt @10am  pt sleeping deeply, then again at 12:25pm -pt very agitated. Will continue efforts next date per PT plan of care as schedule permits. ? ? ?[name] ?12/20/2021, 1:40 PM ? ? ?

## 2021-12-20 NOTE — Progress Notes (Signed)
Family Medicine Teaching Service ?Daily Progress Note ?Intern Pager: [phone] ? ?Patient name: Joann [name] Medical record number: [id number] ?Date of birth: Aug 24, 1981 Age: 41 y.o. Gender: female ? ?Primary Care Provider: No primary care provider on file. ?Consultants: GI, Neurology ?Code Status: Full ? ?Pt Overview and Major Events to Date:  ?4/9 admitted ? ?Assessment and Plan: ?Joann [name] is a 41 year old female presenting with 1 to 2 weeks of progressive confusion and agitation likely secondary to hepatic encephalopathy.  Past medical history significant for cirrhosis, hepatic encephalopathy, alcohol abuse, alcoholic pancreatitis, esophageal varices ? ?Hepatic encephalopathy ?Alcohol cirrhosis ?Agitated last night. Received 2 0.5 mg ativan last night. Plan for EGD when more stable mentation. Ammonia 198>146. EEG did not show seizures/epileptiform discharges ?-Discontinue cEEG (patient tore off) ?-Continue CIWA protocol ?-Safety observation ?-IV ceftraxone for SBP prophylaxis (day 4/7).  ?-IV protonix 40 mg BID ?-Continue Lactulose 20 mg QID PO or enema ?-Xifaxan 550 mg BID ?-TOC consult for substance abuse ?-Thiamine/Folate/MVI ?-GI consulted appreciate recommendations ?-Neuro signed off  ? ?Anemia ?Concern for upper GI bleed ?Thrombocytopenia ?Elevated INR ?Platelet 59>85. Hgb 10.4>10.1. ?-Consider resuming pharmacologic VTE prophylaxis tomorrow ?-Octreotide discontinued ? ?Hypokalemia ?Hypomagnesia ?Mg 1.5, K 3.8 ?-IV Mg Sulfate ?-Klor-Con 40 meq x1 ? ?FEN/GI: Regular ?PPx: SCDs ?Dispo:Home in 2-3 days. Barriers include continued encephalopathy.  ? ?Subjective:  ?Patient seen and assessed at bedside.  Patient was not at bedside.  Patient was more easily arousable and alert.  Patient continues to be confused but mental status is improving. ? ?Objective: ?Temp:  [96.7 ?F (35.9 ?C)-97.7 ?F (36.5 ?C)] 97.7 ?F (36.5 ?C) (04/11 1909) ?Pulse Rate:  [70-104] 104 (04/12 0251) ?Resp:  [17-20] 20 (04/12 0251) ?BP:  (90-120)/(62-73) 119/65 (04/11 1909) ?SpO2:  [94 %-97 %] 94 % (04/12 0251) ?Physical Exam: ?General: Agitated, ill-appearing ?Cardiovascular: RRR ?Respiratory: CTA B ?Abdomen: Soft, distended ?Extremities: Warm, dry ? ?Laboratory: ?Recent Labs  ?Lab 12/17/21 ?1452 12/18/21 ?0636 12/18/21 ?1540 12/19/21 ?0840  ?WBC 5.3 6.2  --  6.1  ?HGB 4.4* 9.4* 10.2* 10.4*  ?HCT 17.6* 30.2* 33.7* 34.5*  ?PLT 66* 65*  --  59*  ? ?Recent Labs  ?Lab 12/17/21 ?1452 12/18/21 ?0636 12/19/21 ?0840  ?NA 136 138 139  ?K 2.0* 3.0* 3.3*  ?CL 106 112* 116*  ?CO2 23 20* 18*  ?BUN <5* <5* <5*  ?CREATININE 0.78 0.66 0.65  ?CALCIUM 8.3* 7.9* 8.4*  ?PROT 8.6* 8.1 8.0  ?BILITOT 4.0* 8.4* 5.1*  ?ALKPHOS 149* 133* 130*  ?ALT 18 18 19   ?AST 73* 62* 90*  ?GLUCOSE 103* 130* 117*  ? ? ? ?Imaging/Diagnostic Tests: ?EEG adult ? ?Result Date: 12/20/2021 ?Lora Havens, MD     12/20/2021  8:17 AM Patient Name: [name] MRN: [id number] Epilepsy Attending: Lora Havens Referring Physician/Provider: France Ravens, MD Date: 12/19/2021 Duration: 22.28 mins Patient history: 41 y.o. female with PMH significant for alcohol use complicated by alcoholic cirrhosis, esophageal varices, known hepatic encephalopathy, hypertension who presents with worsening confusion and somnolence. EEG to evaluate for seizure Level of alertness: lethargic AEDs during EEG study: None Technical aspects: This EEG study was done with scalp electrodes positioned according to the 10-20 International system of electrode placement. Electrical activity was acquired at a sampling rate of 500Hz  and reviewed with a high frequency filter of 70Hz  and a low frequency filter of 1Hz . EEG data were recorded continuously and digitally stored. Description: EEG showed continuous generalized rhythmic 2 to 3 Hz delta slowing. Hyperventilation and photic stimulation were not  performed.   ABNORMALITY -Generalized rhythmic delta activity IMPRESSION: This study is suggestive of moderate to severe diffuse  encephalopathy, nonspecific etiology.  No seizures or epileptiform discharges were seen throughout the recording. Priyanka Barbra Sarks  ? ?Overnight EEG with video ? ?Result Date: 12/20/2021 ?Lora Havens, MD     12/20/2021  8:24 AM Patient Name: [name] MRN: [id number] Epilepsy Attending: Lora Havens Referring Physician/Provider: Donnetta Simpers, MD Duration: 12/19/2021 1707 to 12/20/2021 0825  Patient history: 41 y.o. female with PMH significant for alcohol use complicated by alcoholic cirrhosis, esophageal varices, known hepatic encephalopathy, hypertension who presents with worsening confusion and somnolence. EEG to evaluate for seizure  Level of alertness: awake/lethargic, asleep  AEDs during EEG study: None  Technical aspects: This EEG study was done with scalp electrodes positioned according to the 10-20 International system of electrode placement. Electrical activity was acquired at a sampling rate of 500Hz  and reviewed with a high frequency filter of 70Hz  and a low frequency filter of 1Hz . EEG data were recorded continuously and digitally stored.  Description: During awake state, no clear posterior dominant rhythm was seen. Sleep was characterized by sleep spindles (12-14Hz ), maximal frontocentral region.  EEG showed continuous generalized rhythmic 2 to 3 Hz delta slowing. Hyperventilation and photic stimulation were not performed.    ABNORMALITY -Generalized rhythmic delta activity  IMPRESSION: This study is suggestive of moderate to severe diffuse encephalopathy, nonspecific etiology.  No seizures or epileptiform discharges were seen throughout the recording. Lora Havens   ? France Ravens, MD ?12/20/2021, 6:36 AM ?PGY-1, [place] ?[place] Intern pager: [phone], text pages welcome ?

## 2021-12-20 NOTE — Consult Note (Addendum)
NEUROLOGY CONSULTATION NOTE  ? ?Date of service: December 20, 2021 ?Patient Name: Joann [name] ?MRN:  [id number] ?DOB:  08/30/1981 ?Reason for consult: "AMS, persistent somnolence" ?Requesting Provider: Martyn Malay, MD ?_ _ _   _ __   _ __ _ _  __ __   _ __   __ _ ? ?History of Present Illness  ?No family at bedside.  ?She appeared more awake and responsive exam.  ?Patient was seen sitting up in bed, awake, dysphoric with EEG leads on. Patient was not engaged with evaluation. She demonstrated intact comprehension and speech by exhibiting mood congruent responses. No focal deficit noted, able to move all 4 limbs to reposition in bed.  ?  ?ROS  ? ?Unable to obtain detailed review of systems secondary to encephalopathy. ? ?Past History  ? ?Past Medical History:  ?Diagnosis Date  ?? Alcohol abuse   ?? Anxiety   ?? Hypertension   ?? Liver disease   ? ?Past Surgical History:  ?Procedure Laterality Date  ?? ECTOPIC PREGNANCY SURGERY    ?? ESOPHAGOGASTRODUODENOSCOPY (EGD) WITH PROPOFOL N/A 12/27/2019  ? Procedure: ESOPHAGOGASTRODUODENOSCOPY (EGD) WITH PROPOFOL;  Surgeon: Carol Ada, MD;  Location: [place];  Service: Endoscopy;  Laterality: N/A;  ? ?Family History  ?Problem Relation Age of Onset  ?? Healthy Mother   ?? Alcohol abuse Mother   ?     quit in her 67s  ?? Alcohol abuse Father   ?? Diabetes Father   ?? Healthy Brother   ?? Hypertension Maternal Grandmother   ?? Hypertension Maternal Grandfather   ?? Healthy Brother   ?? Healthy Brother   ? ?Social History  ? ?Socioeconomic History  ?? Marital status: Married  ?  Spouse name: Not on file  ?? Number of children: Not on file  ?? Years of education: Not on file  ?? Highest education level: Not on file  ?Occupational History  ?? Occupation: nurse  ?Tobacco Use  ?? Smoking status: Never  ?? Smokeless tobacco: Never  ?Vaping Use  ?? Vaping Use: Never used  ?Substance and Sexual Activity  ?? Alcohol use: Yes  ?  Comment: "I just do it"  ?? Drug use: Never  ?? Sexual  activity: Yes  ?Other Topics Concern  ?? Not on file  ?Social History Narrative  ?? Not on file  ? ?Social Determinants of Health  ? ?Financial Resource Strain: Not on file  ?Food Insecurity: Not on file  ?Transportation Needs: Not on file  ?Physical Activity: Not on file  ?Stress: Not on file  ?Social Connections: Not on file  ? ?Allergies  ?Allergen Reactions  ?? Latex Hives  ?  Patient stated that she is allergic to latex  ? ? ?Medications  ? ?Medications Prior to Admission  ?Medication Sig Dispense Refill Last Dose  ?? Multiple Vitamin (MULTIVITAMIN WITH MINERALS) TABS tablet Take 1 tablet by mouth daily.   12/13/2021  ?? traZODone (DESYREL) 150 MG tablet Take 150 mg by mouth at bedtime as needed for sleep.   02/09/8314  ?? folic acid (FOLVITE) 1 MG tablet Take 1 tablet (1 mg total) by mouth daily. (Patient not taking: Reported on 12/17/2021) 30 tablet 0 Not Taking  ?? lactulose (CHRONULAC) 10 GM/15ML solution Take 45 mLs (30 g total) by mouth every 8 (eight) hours. (Patient not taking: Reported on 12/17/2021) 236 mL 3 Not Taking  ?? magnesium gluconate (MAGONATE) 30 MG tablet Take 1 tablet (30 mg total) by mouth 2 (two) times daily. (  Patient not taking: Reported on 12/17/2021) 60 tablet 3 Not Taking  ?? midodrine (PROAMATINE) 5 MG tablet Take 1 tablet (5 mg total) by mouth 3 (three) times daily with meals. (Patient not taking: Reported on 12/17/2021) 90 tablet 0 Not Taking  ?? ondansetron (ZOFRAN ODT) 4 MG disintegrating tablet Take 1 tablet (4 mg total) by mouth every 8 (eight) hours as needed for nausea or vomiting. (Patient not taking: Reported on 12/17/2021) 10 tablet 0 Not Taking  ?? potassium chloride 20 MEQ TBCR Take 10 mEq by mouth daily. (Patient not taking: Reported on 12/17/2021) 30 tablet 1 Not Taking  ?? prednisoLONE 5 MG TABS tablet Take 8 tablets (40 mg total) by mouth daily. (Patient not taking: Reported on 12/17/2021) 13 tablet 0 Not Taking  ?? rifaximin (XIFAXAN) 550 MG TABS tablet Take 1 tablet (550 mg total)  by mouth 2 (two) times daily. (Patient not taking: Reported on 12/17/2021) 42 tablet 0 Not Taking  ?? traZODone (DESYREL) 50 MG tablet Take 0.5 tablets (25 mg total) by mouth at bedtime. (Patient not taking: Reported on 12/17/2021) 30 tablet 0 Not Taking  ?? venlafaxine (EFFEXOR) 75 MG tablet Take 1 tablet (75 mg total) by mouth daily. (Patient not taking: Reported on 12/17/2021) 30 tablet 0 Not Taking  ?? zinc sulfate 220 (50 Zn) MG capsule Take 1 capsule (220 mg total) by mouth daily. (Patient not taking: Reported on 12/17/2021) 30 capsule 0 Not Taking  ?  ? ?Vitals  ? ?Vitals:  ? 12/19/21 1448 12/19/21 1909 12/20/21 0251 12/20/21 0720  ?BP: 120/73 119/65  111/65  ?Pulse: 92 100 (!) 104 73  ?Resp: 19 17 20 18   ?Temp: 97.6 ?F (36.4 ?C) 97.7 ?F (36.5 ?C)  97.6 ?F (36.4 ?C)  ?TempSrc: Axillary Oral  Oral  ?SpO2: 97% 95% 94% 98%  ?Weight:      ?Height:      ?  ? ?Body mass index is 26.34 kg/m?. ? ?Physical Exam  ? ?General: In no acute distress.  Repositions herself several times in the bed. ?HENT: Grossly normal oropharynx and mucosa. Grossly normal external appearance of ears and nose. ?CV: No JVD. No peripheral edema. ?Pulmonary: Symmetric Chest rise. Normal respiratory effort. ?Ext: No cyanosis, edema, or deformity ?Skin: No rash. Normal palpation of skin.  ?Musculoskeletal: Normal digits and nails by inspection. No clubbing. ? ?Neurologic Examination  ?Mental status/Cognition:  ?- Awake and alert, responsive to verbal stimuli. Did not respond to orientation questions. ?- Does not follow commands, does not answer any orientation questions, but demonstrated intact comprehension by clapping back, turning head in opposite directions.  ?- Affect full range, mild lability between intermittent tearfulness and frustration ? ?Speech/language: Exam limited by participation. No dysarthria, non fluent. Speech grossly intact, she acknowledges questions,  occasionally with a "yes" or "no".  ? ?Cranial nerves:  ? CN II Pupils equal and  reactive to light, no VF deficits   ? CN III,IV,VI EOM intact to dolls eyes, downwards gaze, no nystagmus.  ? CN V Corneals intact BL, shuts her eyes closed tight  ? CN VII No asymmetry, no nasolabial fold flattening. Symmetric facial grimace.  ? CN VIII Acknowledges speech, exhibited frustration and agitation when I talk to her louder.  ? CN IX & X normal palatal elevation, no uvular deviation   ? CN XI Able to move head left and right  ? CN XII midline tongue but does not protrude on command.  ? ?Motor:  ?Muscle bulk: poor, tone normal ? ?  Unable to do detailed strength testing secondary to patient engagement. She moves all of her extremities spontaneously and semipurposefully. Able to reposition herself several times in the bed. Can lift b/l UE arms against gravity without difficulty. B/l LE were crossed, but noted movement to reposition herself. Declined to straighten her legs.  ? ?Reflexes: ? Right Left Comments  ?Pectoralis     ? Biceps (C5/6) 2+ 2+   ?Brachioradialis (C5/6) 2+ 2+   ? Triceps (C6/7) 2+ 2+   ? Patellar (L3/4) 3 3   ? Achilles (S1) 4 4 3-4 beats of clonus BL  ? Hoffman + +   ? Plantar     ?Jaw jerk   ? ?Sensation: ? Light touch Regards touch in all extremities.  ? Pin prick   ? Temperature   ? Vibration   ?Proprioception   ? ?Coordination/Complex Motor:  ?- Finger to Nose unable to assess but her movements appear smooth BL. ?- Gait: deferred for patient safety. ? ?Labs  ? ?CBC:  ?Recent Labs  ?Lab 12/19/21 ?1856 12/20/21 ?3149  ?WBC 6.1 10.2  ?HGB 10.4* 10.1*  ?HCT 34.5* 34.4*  ?MCV 82.7 83.9  ?PLT 59* 85*  ? ? ? ?Basic Metabolic Panel:  ?Lab Results  ?Component Value Date  ? NA 141 12/20/2021  ? K 3.8 12/20/2021  ? CO2 17 (L) 12/20/2021  ? GLUCOSE 135 (H) 12/20/2021  ? BUN <5 (L) 12/20/2021  ? CREATININE 0.68 12/20/2021  ? CALCIUM 8.6 (L) 12/20/2021  ? GFRNONAA >60 12/20/2021  ? GFRAA >60 01/14/2020  ? ?Lipid Panel: No results found for: [place] ?HgbA1c:  ?Lab Results  ?Component Value Date  ?  HGBA1C 4.8 09/29/2018  ? ?Urine Drug Screen:  ?   ?Component Value Date/Time  ? LABOPIA NONE DETECTED 12/17/2021 1452  ? COCAINSCRNUR NONE DETECTED 12/17/2021 1452  ? LABBENZ NONE DETECTED 12/17/2021

## 2021-12-20 NOTE — Procedures (Signed)
Patient Name: Joann [name]  ?MRN: [id number]  ?Epilepsy Attending: Lora Havens  ?Referring Physician/Provider: France Ravens, MD ?Date: 12/19/2021 ?Duration: 22.28 mins ? ?Patient history: 41 y.o. female with PMH significant for alcohol use complicated by alcoholic cirrhosis, esophageal varices, known hepatic encephalopathy, hypertension who presents with worsening confusion and somnolence. EEG to evaluate for seizure ? ?Level of alertness: lethargic  ? ?AEDs during EEG study: None ? ?Technical aspects: This EEG study was done with scalp electrodes positioned according to the 10-20 International system of electrode placement. Electrical activity was acquired at a sampling rate of 500Hz  and reviewed with a high frequency filter of 70Hz  and a low frequency filter of 1Hz . EEG data were recorded continuously and digitally stored.  ? ?Description: EEG showed continuous generalized rhythmic 2 to 3 Hz delta slowing. Hyperventilation and photic stimulation were not performed.    ? ?ABNORMALITY ?-Generalized rhythmic delta activity ? ?IMPRESSION: ?This study is suggestive of moderate to severe diffuse encephalopathy, nonspecific etiology.  No seizures or epileptiform discharges were seen throughout the recording. ? ?Lora Havens  ? ?

## 2021-12-20 NOTE — Procedures (Addendum)
Patient Name: Joann [name]  ?MRN: [id number]  ?Epilepsy Attending: Lora Havens  ?Referring Physician/Provider: Donnetta Simpers, MD ?Duration: 12/19/2021 1707 to 12/20/2021 1117 ?  ?Patient history: 41 y.o. female with PMH significant for alcohol use complicated by alcoholic cirrhosis, esophageal varices, known hepatic encephalopathy, hypertension who presents with worsening confusion and somnolence. EEG to evaluate for seizure ?  ?Level of alertness: awake/lethargic, asleep  ?  ?AEDs during EEG study: None ?  ?Technical aspects: This EEG study was done with scalp electrodes positioned according to the 10-20 International system of electrode placement. Electrical activity was acquired at a sampling rate of 500Hz  and reviewed with a high frequency filter of 70Hz  and a low frequency filter of 1Hz . EEG data were recorded continuously and digitally stored.  ?  ?Description: During awake state, no clear posterior dominant rhythm was seen. Sleep was characterized by sleep spindles (12-14Hz ), maximal frontocentral region.  EEG showed continuous generalized rhythmic 2 to 3 Hz delta slowing. Hyperventilation and photic stimulation were not performed.    ?  ?ABNORMALITY ?-Generalized rhythmic delta activity ?  ?IMPRESSION: ?This study is suggestive of moderate to severe diffuse encephalopathy, nonspecific etiology.  No seizures or epileptiform discharges were seen throughout the recording. ? ?Lora Havens  ?

## 2021-12-20 NOTE — TOC Benefit Eligibility Note (Signed)
Patient Advocate Encounter ?  ?Received notification fthat prior authorization for Xifaxan 550 mg tablets is required. ?  ?PA submitted on 12/20/2021 ?Prior Authorization Request [id number] ?[place] ?Status is pending ?   ? ? ? ?Lyndel Safe, CPhT ?Pharmacy Patient Advocate Specialist ?[facility] Patient Advocate Team ?Direct Number: [phone]  Fax: [phone]  ?

## 2021-12-21 LAB — CBC
HCT: 32.4 % — ABNORMAL LOW (ref 36.0–46.0)
Hemoglobin: 9.8 g/dL — ABNORMAL LOW (ref 12.0–15.0)
MCH: 25.1 pg — ABNORMAL LOW (ref 26.0–34.0)
MCHC: 30.2 g/dL (ref 30.0–36.0)
MCV: 83.1 fL (ref 80.0–100.0)
Platelets: 80 10*3/uL — ABNORMAL LOW (ref 150–400)
RBC: 3.9 MIL/uL (ref 3.87–5.11)
RDW: 21.2 % — ABNORMAL HIGH (ref 11.5–15.5)
WBC: 7.8 10*3/uL (ref 4.0–10.5)
nRBC: 0.3 % — ABNORMAL HIGH (ref 0.0–0.2)

## 2021-12-21 LAB — PROTIME-INR
INR: 1.7 — ABNORMAL HIGH (ref 0.8–1.2)
Prothrombin Time: 19.8 seconds — ABNORMAL HIGH (ref 11.4–15.2)

## 2021-12-21 LAB — COMPREHENSIVE METABOLIC PANEL
ALT: 18 U/L (ref 0–44)
AST: 61 U/L — ABNORMAL HIGH (ref 15–41)
Albumin: 2.6 g/dL — ABNORMAL LOW (ref 3.5–5.0)
Alkaline Phosphatase: 132 U/L — ABNORMAL HIGH (ref 38–126)
Anion gap: 7 (ref 5–15)
BUN: <5 mg/dL — ABNORMAL LOW (ref 6–20)
CO2: 17 mmol/L — ABNORMAL LOW (ref 22–32)
Calcium: 7.9 mg/dL — ABNORMAL LOW (ref 8.9–10.3)
Chloride: 112 mmol/L — ABNORMAL HIGH (ref 98–111)
Creatinine, Ser: 0.56 mg/dL (ref 0.44–1.00)
GFR, Estimated: >60 mL/min (ref >60)
Glucose, Bld: 85 mg/dL (ref 70–99)
Potassium: 3.6 mmol/L (ref 3.5–5.1)
Sodium: 136 mmol/L (ref 135–145)
Total Bilirubin: 3.3 mg/dL — ABNORMAL HIGH (ref 0.3–1.2)
Total Protein: 7.6 g/dL (ref 6.5–8.1)

## 2021-12-21 LAB — T4, FREE: Free T4: 0.93 ng/dL (ref 0.61–1.12)

## 2021-12-21 LAB — MAGNESIUM: Magnesium: 1.4 mg/dL — ABNORMAL LOW (ref 1.7–2.4)

## 2021-12-21 MED ORDER — POTASSIUM CHLORIDE CRYS ER 20 MEQ PO TBCR
40.0000 meq | EXTENDED_RELEASE_TABLET | Freq: Every day | ORAL | Status: DC
  Administered 2021-12-21 – 2021-12-22 (×2): 40 meq via ORAL
  Filled 2021-12-21 (×2): qty 2

## 2021-12-21 MED ORDER — POTASSIUM CHLORIDE CRYS ER 20 MEQ PO TBCR: 40.0000 meq | EXTENDED_RELEASE_TABLET | Freq: Two times a day (BID) | ORAL | Status: DC

## 2021-12-21 MED ORDER — PANTOPRAZOLE SODIUM 40 MG PO TBEC
40.0000 mg | DELAYED_RELEASE_TABLET | Freq: Two times a day (BID) | ORAL | Status: DC
Start: 2021-12-21 — End: 2021-12-22
  Administered 2021-12-21 – 2021-12-22 (×2): 40 mg via ORAL
  Filled 2021-12-21 (×2): qty 1

## 2021-12-21 MED ORDER — MAGNESIUM SULFATE 2 GM/50ML IV SOLN
2.0000 g | Freq: Once | INTRAVENOUS | Status: AC
  Administered 2021-12-21: 2 g via INTRAVENOUS
  Filled 2021-12-21: qty 50

## 2021-12-21 NOTE — Progress Notes (Signed)
Occupational Therapy Treatment ?Patient Details ?Name: Joann [name] ?MRN: [id number] ?DOB: 07-Jan-1981 ?Today's Date: 12/21/2021 ? ? ?History of present illness Patient is a 41 yo female presenting to the ED with AMS. Admitted with severe anemia, hepatic encephalopathy, and hypokalemia. PMH is significant for alcoholic cirrhosis, history of hepatic encephalopathy, etOH abuse, history of alcoholic pancreatitis and esophageal varices. ?  ?OT comments ? Treatment continues to be limited due to patient's drowsiness and altered mental status but does show some improvement. Patient able to follow commands to lay down and roll over so therapist could apply appointment to periarea for comfort and to wash her face - though quality poor. Patient falling asleep and not agreeable to more activity at this time. Cont POC.  ? ?Recommendations for follow up therapy are one component of a multi-disciplinary discharge planning process, led by the attending physician.  Recommendations may be updated based on patient status, additional functional criteria and insurance authorization. ?   ?Follow Up Recommendations ? Other (comment) (Rehab for substance abuse per mother)  ?  ?Assistance Recommended at Discharge Frequent or constant Supervision/Assistance  ?Patient can return home with the following ? Two people to help with walking and/or transfers;Two people to help with bathing/dressing/bathroom;Assistance with cooking/housework;Assistance with feeding;Direct supervision/assist for medications management;Direct supervision/assist for financial management;Assist for transportation ?  ?Equipment Recommendations ? BSC/3in1;Other (comment) (TBD)  ?  ?Recommendations for Other Services Other (comment) (drug rehab) ? ?  ?Precautions / Restrictions Precautions ?Precautions: Fall ?Restrictions ?Weight Bearing Restrictions: No  ? ? ?  ? ?Mobility Bed Mobility ?  ?  ?  ?  ?  ?  ?  ?  ?  ? ?Transfers ?  ?  ?  ?  ?  ?  ?  ?  ?  ?  ?  ?  ?Balance    ?Sitting-balance support: No upper extremity supported ?Sitting balance-Leahy Scale: Fair ?Sitting balance - Comments: fair in crossed leg position in bed ?  ?  ?  ?  ?  ?  ?  ?  ?  ?  ?  ?  ?  ?  ?  ?   ? ?ADL either performed or assessed with clinical judgement  ? ?ADL Overall ADL's : Needs assistance/impaired ?  ?  ?Grooming: Set up;Wash/dry face;Bed level ?Grooming Details (indicate cue type and reason): Patient in bed initially cross legged then laid herself down. Patient able to wash her face with setup, though with poor quality. ?  ?  ?  ?  ?  ?  ?  ?  ?  ?  ?  ?  ?  ?  ?Functional mobility during ADLs: Supervision/safety (bed level) ?General ADL Comments: Patient found sitting up cross legged in bed. She complained of buttock pain from BMs. Patient able to lay down and roll on her left side so therapist could apply ointment for relieft. Patient drowsy and wanting to go to sleep. Patient able to wash her face with setup though quality poor. Patient not agreeable to more actiivty at this time. Mom in room assisting patient. ?  ? ?Extremity/Trunk Assessment   ?  ?  ?  ?  ?  ? ?Vision   ?Vision Assessment?: No apparent visual deficits ?  ?Perception   ?  ?Praxis   ?  ? ?Cognition Arousal/Alertness: Lethargic (drowsy) ?Behavior During Therapy: Flat affect ?Overall Cognitive Status: Difficult to assess ?  ?  ?  ?  ?  ?  ?  ?  ?  ?  ?  ?  ?  ?  ?  ?  ?  General Comments: Is alert to self and [facility]. Verbalizations limited. 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  ?  ?   ?Exercises   ? ?  ?Shoulder Instructions   ? ? ?  ?General Comments    ? ? ?Pertinent Vitals/ Pain       Pain Assessment ?Pain Assessment: Faces ?Faces Pain Scale: Hurts little more ?Pain Location: buttocks (from constant BMs) ?Pain Descriptors / Indicators: Grimacing ?Pain Intervention(s): Monitored during session ? ?Home Living   ?  ?  ?  ?  ?  ?  ?  ?  ?  ?  ?  ?  ?  ?  ?  ?  ?   ?  ? ?  ?Prior Functioning/Environment    ?  ?  ?  ?   ? ?Frequency ? Min 2X/week  ? ? ? ? ?  ?Progress Toward Goals ? ?OT Goals(current goals can now be found in the care plan section) ? Progress towards OT goals: Progressing toward goals ? ?Acute Rehab OT Goals ?OT Goal Formulation: Patient unable to participate in goal setting ?Time For Goal Achievement: 01/02/22 ?Potential to Achieve Goals: Good  ?Plan Discharge plan remains appropriate   ? ?Co-evaluation ? ? ?   ?  ?  ?OT goals addressed during session: ADL's and self-care ?  ? ?  ?AM-PAC OT "6 Clicks" Daily Activity     ?Outcome Measure ? ? Help from another person eating meals?: A Lot ?Help from another person taking care of personal grooming?: A Little ?Help from another person toileting, which includes using toliet, bedpan, or urinal?: Total ?Help from another person bathing (including washing, rinsing, drying)?: A Lot ?Help from another person to put on and taking off regular upper body clothing?: A Lot ?Help from another person to put on and taking off regular lower body clothing?: Total ?6 Click Score: 11 ? ?  ?End of Session   ? ?OT Visit Diagnosis: Muscle weakness (generalized) (M62.81) ?  ?Activity Tolerance Patient limited by fatigue ?  ?Patient Left in bed;with call bell/phone within reach;with bed alarm set;with family/visitor present ?  ?Nurse Communication Mobility status;Precautions ?  ? ?   ? ?Time: 6219-4712 ?OT Time Calculation (min): 11 min ? ?Charges: OT General Charges ?$OT Visit: 1 Visit ?OT Treatments ?$Self Care/Home Management : 8-22 mins ? ?Joann [name], OTR/L ?Acute Care Rehab Services  ?Office [phone] ?Pager: [phone]  ? ?Joann [name] ?12/21/2021, 11:47 AM ?

## 2021-12-21 NOTE — Progress Notes (Addendum)
Family Medicine Teaching Service ?Daily Progress Note ?Intern Pager: [phone] ? ?Patient name: Joann [name] Medical record number: [id number] ?Date of birth: August 12, 1981 Age: 41 y.o. Gender: female ? ?Primary Care Provider: No primary care provider on file. ?Consultants: GI, Neurology (s/o) ?Code Status: Full ? ?Pt Overview and Major Events to Date:  ?4/9 admit ? ?Assessment and Plan: ?Joann [name] is a 41 year old female presenting with 1 to 2 weeks of progressive confusion and agitation likely secondary to hepatic encephalopathy.  Past medical history significant for cirrhosis, hepatic encephalopathy, alcohol abuse, alcoholic pancreatitis, esophageal varices ? ?Hepatic encephalopathy ?Alcohol cirrhosis ?Mentation improving. She's somnolent but more oriented.  ?-Continue CIWA protocol ?-Safety observation ?-IV ceftraxone discontinued ?-IV protonix 40 mg BID ?-Continue Lactulose 20 mg QID PO or enema ?-Xifaxan 550 mg BID ?-TOC consult for substance abuse ?-Thiamine/Folate/MVI ?-GI consulted appreciate recommendations ?-RD consulted, appreciate recs (multiple vitamin and mineral labs ordered and pending) ? ?Anemia ?Concern for upper GI bleed ?Thrombocytopenia ?Elevated INR ?Hgb 10.1>9.8. Platelet 85>80. INR 1.6>1.7. ?-Consider resuming pharmacologic VTE prophylaxis tomorrow ? ?Hypokalemia ?Hypomagnesia ?K 3.6, Mg 1.4 ?-Kdur 40 meq daily ?-IV Mag ox 2 mg  ? ?Suppressed TSH ?-Free T3/T4 pending ? ?FEN/GI: regular ?PPx: SCD ?Dispo:Substance abuse rehab in 2-3 days. Barriers include ongoing encephalopathy.  ? ?Subjective:  ?Patient seen and assessed at bedside.  Patient more alert today and more easily arousable.  Patient is oriented to self, location, time.  Less labile today. ? ?Objective: ?Temp:  [97.6 ?F (36.4 ?C)-97.8 ?F (36.6 ?C)] 97.8 ?F (36.6 ?C) (04/13 0200) ?Pulse Rate:  [73-90] 90 (04/13 0200) ?Resp:  [14-23] 14 (04/13 0200) ?BP: (111-138)/(65-98) 131/87 (04/13 0200) ?SpO2:  [98 %-100 %] 100 % (04/13 0200) ?Physical  Exam: ?General: Resting comfortably, NAD ?Cardiovascular: RRR ?Respiratory: CTA B ?Abdomen: Soft nontender ?Extremities: Warm, dry ? ?Laboratory: ?Recent Labs  ?Lab 12/19/21 ?0840 12/20/21 ?1478 12/21/21 ?0231  ?WBC 6.1 10.2 7.8  ?HGB 10.4* 10.1* 9.8*  ?HCT 34.5* 34.4* 32.4*  ?PLT 59* 85* 80*  ? ?Recent Labs  ?Lab 12/19/21 ?0840 12/20/21 ?2956 12/21/21 ?0231  ?NA 139 141 136  ?K 3.3* 3.8 3.6  ?CL 116* 117* 112*  ?CO2 18* 17* 17*  ?BUN <5* <5* <5*  ?CREATININE 0.65 0.68 0.56  ?CALCIUM 8.4* 8.6* 7.9*  ?PROT 8.0 9.1* 7.6  ?BILITOT 5.1* 4.6* 3.3*  ?ALKPHOS 130* 143* 132*  ?ALT 19 21 18   ?AST 90* 82* 61*  ?GLUCOSE 117* 135* 85  ? ? ? ? ?Imaging/Diagnostic Tests: ?No results found. ? ?France Ravens, MD ?12/21/2021, 6:26 AM ?PGY-1, [place] ?[place] Intern pager: [phone], text pages welcome ?

## 2021-12-21 NOTE — Progress Notes (Signed)
FPTS Interim Progress Note ? ?S:Went to bedside to check on patient, patient denies any pain or concerns at this time. Husband also at bedside, he reports that he will remain here with her throughout the night. She says that she would like a popsicle which was later brought to her. When asked if she remembers me, she says that she last saw me in the ED. Husband also confirms that she has improved significantly and denies any concerns.  ? ?O: ?BP 123/79 (BP Location: Left Arm)   Pulse 95   Temp 98.3 ?F (36.8 ?C) (Oral)   Resp 18   Ht 5' 4"  (1.626 m)   Wt 69.6 kg   SpO2 100%   BMI 26.34 kg/m?   ?General: Patient sitting upright in bed, in no acute distress. ?Resp: normal work of breathing, on room air ?Neuro: AOx2 (alert to person and place, able to tell me the month but not the year) ?Psych: mood appropriate, no agitation and anxious mood noted  ? ?A/P: ?Patient admitted with symptomatic anemia and altered mental status likely secondary to hepatic encephalopathy. Has also had multiple episodes of hypokalemia for which she is now on scheduled potassium supplementation per day team. Will recheck BMP in the morning. Also ordered a Hgb given anemia and recent decrease in Hgb. Will continue to monitor mental status and CIWAs. Last CIWA 6. Continue lactulose and rifaximin. Discussed with nursing staff who shares that she has definitely demonstrated an improvement in her mental status and less agitated. Vitals and orders reviewed and appropriate. Remainder of plan per day team.  ? Donney Dice, DO ?12/21/2021, 9:36 PM ?PGY-2, [place] ?Service pager [phone]  ?

## 2021-12-21 NOTE — Progress Notes (Signed)
Patient asked for phone and was speaking with her husband while this RN was in the room. Pt given PO medication crushed in applesauce. Grape juice to drink and large pitcher of ice per husbands request. Mom in room and will order breakfast for patient. Patient monitored by telesitter. ?Morning occurrence of patient attempting get out of bed and voided all in floor. Pt given bath and linen changed. ?

## 2021-12-21 NOTE — Progress Notes (Signed)
Physical Therapy Treatment ?Patient Details ?Name: Joann [name] ?MRN: [id number] ?DOB: Nov 14, 1980 ?Today's Date: 12/21/2021 ? ? ?History of Present Illness Patient is a 41 yo female presenting to the ED with AMS. Admitted with severe anemia, hepatic encephalopathy, and hypokalemia. PMH is significant for alcoholic cirrhosis, history of hepatic encephalopathy, ETOH abuse, history of alcoholic pancreatitis and esophageal varices. ? ?  ?PT Comments  ? ? Pt received in supine, agreeable to therapy session with encouragement and with mother present and encouraging. Pt making good progress toward functional mobility goals this date and alert to self/location, and following simple 1-step commands well. Pt with improved fair balance using RW and able to progress gait to household distances with min guard for safety. Pt Supervision for bed mobility. Pt continues to benefit from PT services to progress toward functional mobility goals.   ?Recommendations for follow up therapy are one component of a multi-disciplinary discharge planning process, led by the attending physician.  Recommendations may be updated based on patient status, additional functional criteria and insurance authorization. ? ?Follow Up Recommendations ? Other (comment) (plan for alcohol rehab facility per mother) ?  ?  ?Assistance Recommended at Discharge Intermittent Supervision/Assistance  ?Patient can return home with the following A little help with walking and/or transfers;A little help with bathing/dressing/bathroom;Assistance with cooking/housework;Direct supervision/assist for financial management;Assist for transportation;Help with stairs or ramp for entrance;Direct supervision/assist for medications management ?  ?Equipment Recommendations ? None recommended by PT (TBD)  ?  ?Recommendations for Other Services   ? ? ?  ?Precautions / Restrictions Precautions ?Precautions: Fall ?Precaution Comments: waxing/waning AMS ?Restrictions ?Weight Bearing  Restrictions: No  ?  ? ?Mobility ? Bed Mobility ?Overal bed mobility: Needs Assistance ?Bed Mobility: Supine to Sit, Sit to Supine ?  ?  ?Supine to sit: Supervision ?Sit to supine: Supervision ?  ?General bed mobility comments: no physical assist needed, increased time to initiate ?  ? ?Transfers ?Overall transfer level: Needs assistance ?Equipment used: None ?Transfers: Sit to/from Stand ?Sit to Stand: Supervision ?  ?  ?  ?  ?  ?General transfer comment: no overt LOB, mildly unsteady upon standing but able to self-correct, RW brought for her and pt agreeable to use ?  ? ?Ambulation/Gait ?Ambulation/Gait assistance: Min guard ?Gait Distance (Feet): 100 Feet ?Assistive device: Rolling walker (2 wheels) ?Gait Pattern/deviations: Step-through pattern, Drifts right/left, Narrow base of support, Decreased stride length ?  ?  ?  ?General Gait Details: mildly unsteady/slow pace, varying reliance on RW support for balance, pt does well will goal-directed plan and defers longer trial due to fatigue. Used food as Systems developer for participation. ? ? ? ?  ?Balance Overall balance assessment: Needs assistance ?Sitting-balance support: No upper extremity supported ?Sitting balance-Leahy Scale: Good ?Sitting balance - Comments: pt able to don socks sitting EOB no difficulty with figure-4 position leaning forward no LOB ?  ?Standing balance support: Bilateral upper extremity supported, No upper extremity supported ?Standing balance-Leahy Scale: Fair ?Standing balance comment: fair using RW, fair to poor without AD ?  ?  ?  ?  ? ?  ?Cognition Arousal/Alertness: Awake/alert ?Behavior During Therapy: Flat affect ?Overall Cognitive Status: Difficult to assess ?  ?  ?  ?  ?  ?  ?General Comments: Calm and cooperative, agreeable to mobility with encouragement. Verbalizations limited but following 1-step cues well. Pt agreeable to walk in hallway when therapist offered to have her mother come along, pt somewhat anxious. ?  ?  ? ?  ?    ?   ? ?  Pertinent Vitals/Pain Pain Assessment ?Pain Assessment: Faces ?Faces Pain Scale: No hurt ?Pain Intervention(s): Monitored during session, Repositioned  ? ? ?   ?   ? ?PT Goals (current goals can now be found in the care plan section) Acute Rehab PT Goals ?Patient Stated Goal: none stated, per mother to send daughter to alcohol rehab center ?PT Goal Formulation: With family ?Time For Goal Achievement: 01/02/22 ?Progress towards PT goals: Progressing toward goals ? ?  ?Frequency ? ? ? Min 3X/week ? ? ? ?  ?PT Plan Current plan remains appropriate  ? ? ?   ?AM-PAC PT "6 Clicks" Mobility   ?Outcome Measure ? Help needed turning from your back to your side while in a flat bed without using bedrails?: None ?Help needed moving from lying on your back to sitting on the side of a flat bed without using bedrails?: A Little ?Help needed moving to and from a bed to a chair (including a wheelchair)?: A Little ?Help needed standing up from a chair using your arms (e.g., wheelchair or bedside chair)?: A Little ?Help needed to walk in hospital room?: A Little ?Help needed climbing 3-5 steps with a railing? : A Little ?6 Click Score: 19 ? ?  ?End of Session Equipment Utilized During Treatment: Gait belt ?Activity Tolerance: Patient tolerated treatment well ?Patient left: in bed;with call bell/phone within reach;with family/visitor present (mother present, defer bed alarm until mother leaves the room) ?Nurse Communication: Mobility status ?PT Visit Diagnosis: Muscle weakness (generalized) (M62.81) ?  ? ? ?Time: 1543-1600 ?PT Time Calculation (min) (ACUTE ONLY): 17 min ? ?Charges:  $Gait Training: 8-22 mins          ?          ? ?Kaytlen Lightsey P., PTA ?Acute Rehabilitation Services ?Secure Chat Preferred 9a-5:30pm ?Office: [phone]  ? ? ?Teller Wakefield M Diego Ulbricht ?12/21/2021, 4:27 PM ? ?

## 2021-12-21 NOTE — Progress Notes (Signed)
Call patient's husband, after speaking with him earlier in the shift, for a status update. Advised that patient is asleep and in no distress, that prior to falling asleep she was calm and cooperative with the nursing staff. Further advised husband that she is being closely monitored and would update him with any change in her status. ? ?

## 2021-12-21 NOTE — Progress Notes (Addendum)
? ? ? ?[facility] Progress Note ? ?CC:  Anemia, cirrhosis ? ?Subjective: She is easily arousable at this time. She is able to tell me her name and she knows she is at [facility].  She had 2 loose stools last last night and x 1 earlier am today. No abdominal pain.  No family at the bedside. ? ?Objective:  ?Vital signs in last 24 hours: ? ?Temp:  [97.7 ?F (36.5 ?C)-98.7 ?F (37.1 ?C)] 98.7 ?F (37.1 ?C) (04/13 0910) ?Pulse Rate:  [77-96] 96 (04/13 1236) ?Resp:  [14-20] 18 (04/13 1236) ?BP: (109-132)/(72-87) 109/81 (04/13 1236) ?SpO2:  [99 %-100 %] 100 % (04/13 1236) ?Last BM Date : 12/20/21 ?General: 41 year old female sleeping but easily arousable when name called. ?Heart: Regular rate and rhythm, no murmurs. ?Pulm: Sounds clear throughout. ?Abdomen: Soft, nontender.  Positive bowel sounds all 4 quadrants. ?Extremities:  Without edema. ?Neurologic:  Alert to name and place.  Speech is clear.  Moving all extremities. ?Psych:  Alert and cooperative. Normal mood and affect. ? ?Intake/Output from previous day: ?04/12 0701 - 04/13 0700 ?In: 720 [P.O.:720] ?Out: -  ?Intake/Output this shift: ?No intake/output data recorded. ? ?Lab Results: ?Recent Labs  ?  12/19/21 ?0840 12/20/21 ?3419 12/21/21 ?0231  ?WBC 6.1 10.2 7.8  ?HGB 10.4* 10.1* 9.8*  ?HCT 34.5* 34.4* 32.4*  ?PLT 59* 85* 80*  ? ?BMET ?Recent Labs  ?  12/19/21 ?0840 12/20/21 ?3790 12/21/21 ?0231  ?NA 139 141 136  ?K 3.3* 3.8 3.6  ?CL 116* 117* 112*  ?CO2 18* 17* 17*  ?GLUCOSE 117* 135* 85  ?BUN <5* <5* <5*  ?CREATININE 0.65 0.68 0.56  ?CALCIUM 8.4* 8.6* 7.9*  ? ?LFT ?Recent Labs  ?  12/21/21 ?0231  ?PROT 7.6  ?ALBUMIN 2.6*  ?AST 61*  ?ALT 18  ?ALKPHOS 132*  ?BILITOT 3.3*  ? ?PT/INR ?Recent Labs  ?  12/19/21 ?0840 12/21/21 ?0231  ?LABPROT 19.1* 19.8*  ?INR 1.6* 1.7*  ? ?Hepatitis Panel ?No results for input(s): HEPBSAG, HCVAB, HEPAIGM, HEPBIGM in the last 72 hours. ? ?EEG adult ? ?Result Date: 12/20/2021 ?Joann Havens, MD     12/20/2021  8:17  AM Patient Name: Joann [name] MRN: [id number] Joann [name] Referring Physician/Provider: France Ravens, MD Date: 12/19/2021 Duration: 22.28 mins Patient history: 41 y.o. female with PMH significant for alcohol use complicated by alcoholic cirrhosis, esophageal varices, known hepatic encephalopathy, hypertension who presents with worsening confusion and somnolence. EEG to evaluate for seizure Level of alertness: lethargic AEDs during EEG study: None Technical aspects: This EEG study was done with scalp electrodes positioned according to the 10-20 International system of electrode placement. Electrical activity was acquired at a sampling rate of 500Hz  and reviewed with a high frequency filter of 70Hz  and a low frequency filter of 1Hz . EEG data were recorded continuously and digitally stored. Description: EEG showed continuous generalized rhythmic 2 to 3 Hz delta slowing. Hyperventilation and photic stimulation were not performed.   ABNORMALITY -Generalized rhythmic delta activity IMPRESSION: This study is suggestive of moderate to severe diffuse encephalopathy, nonspecific etiology.  No seizures or epileptiform discharges were seen throughout the recording. Joann [name]  ? ?Overnight EEG with video ? ?Result Date: 12/20/2021 ?Joann Havens, MD     12/20/2021 12:15 PM Patient Name: Joann [name] MRN: [id number] Joann [name] Referring Physician/Provider: Donnetta Simpers, MD Duration: 12/19/2021 1707 to 12/20/2021 1117  Patient history: 41 y.o. female with PMH significant for alcohol  use complicated by alcoholic cirrhosis, esophageal varices, known hepatic encephalopathy, hypertension who presents with worsening confusion and somnolence. EEG to evaluate for seizure  Level of alertness: awake/lethargic, asleep  AEDs during EEG study: None  Technical aspects: This EEG study was done with scalp electrodes positioned according to the 10-20 International system of electrode  placement. Electrical activity was acquired at a sampling rate of 500Hz  and reviewed with a high frequency filter of 70Hz  and a low frequency filter of 1Hz . EEG data were recorded continuously and digitally stored.  Description: During awake state, no clear posterior dominant rhythm was seen. Sleep was characterized by sleep spindles (12-14Hz ), maximal frontocentral region.  EEG showed continuous generalized rhythmic 2 to 3 Hz delta slowing. Hyperventilation and photic stimulation were not performed.    ABNORMALITY -Generalized rhythmic delta activity  IMPRESSION: This study is suggestive of moderate to severe diffuse encephalopathy, nonspecific etiology.  No seizures or epileptiform discharges were seen throughout the recording. Joann [name]   ? ?Assessment / Plan: ? ?80) 41 year old female with alcohol associated cirrhosis, esophageal varices and hepatic encephalopathy.  RUQ sono consistent with cirrhosis, patent portal vein without evidence of any concerning liver lesion/hepatoma. MELD 20. She is not a liver transplant candidate secondary to ongoing alcohol use.  Total bili 3.3.  Alk phos 132.  AST 61.  ALT 18.  Hepatic encephalopathy improved after aggressive lactulose and high-dose thiamine.  She was evaluated by neurology who assessed her hepatic encephalopathy was secondary to hyperammonemia.  Intermittent episodes of agitation at night requiring low-dose Ativan, last dose was given at 8 PM last night. ?-Continue Xifaxan 550 mg p.o. twice daily  ?-Continue Lactulose 20 g p.o. every 6 hours (lactulose enema if she does not tolerate po), titrate to no more than 3-4 loose bowel movements daily ?-Continue to hold Ativan ?-Continue thiamine, folic acid and multivitamin daily ?-Ondansetron 4 mg p.o./IV every 6 hours as needed ?-2 gm low sodium diet as tolerated  ?-Recommend nutritional consult if not already done ?  ?2) Iron deficiency anemia, likely due to epistaxis and bone marrow suppression secondary to  alcohol use.  No overt GI bleeding.  FOBT negative. Admission Hg 4.4 -> Transfused 4 units of PRBCs -> 9.4 -> Hg 10.4 -> 10.1 -> 9.8.  No overt GI bleeding. Octreotide discontinued. ?-Continue Pantoprazole 40 mg IV twice daily ?-Transfuse for hemoglobin < 7 ?-Consider repeat EGD to reassess EV during this hospital admission with neuro status stable ?-Eventual colonoscopy, likely as an outpatient ?  ?3) Thrombocytopenia. PLT 80.  ?  ?4) Coagulopathy secondary to cirrhosis. INR 1.6 -> 1.7. ?  ? ?Principal Problem: ?  Symptomatic anemia ?Active Problems: ?  Hepatic encephalopathy ([place]) ? ? ? ? LOS: 4 days  ? ?Joann [name]  12/21/2021, 1:39 PM ? ?I have taken a history, reviewed the chart and examined the patient. I performed a substantive portion of this encounter, including complete performance of at least one of the key components, in conjunction with the APP. I agree with the APP's note, impression and recommendations ? ?Patient much more alert and oriented on my exam today.  Able to tell me her name, birthdate and where she was.  Was able to tell me who the president was and that it was April (incorrect on date and year).  She was still somnolent, but overall much improved mental status compared to the previous 48 hours. ?Continue current therapy for hepatic encephalopathy. ? ?Still no evidence of overt GI bleeding, okay to  change PPI to oral.  Tentative EGD prior to discharge. ? ?Joann [name] E. Candis Schatz, MD ?[facility] ? ? ?

## 2021-12-22 ENCOUNTER — Other Ambulatory Visit (HOSPITAL_COMMUNITY): Payer: Self-pay

## 2021-12-22 ENCOUNTER — Telehealth: Payer: Self-pay | Admitting: Nurse Practitioner

## 2021-12-22 ENCOUNTER — Other Ambulatory Visit: Payer: Self-pay

## 2021-12-22 DIAGNOSIS — K7010 Alcoholic hepatitis without ascites: Secondary | ICD-10-CM

## 2021-12-22 LAB — CBC
HCT: 33.4 % — ABNORMAL LOW (ref 36.0–46.0)
Hemoglobin: 9.6 g/dL — ABNORMAL LOW (ref 12.0–15.0)
MCH: 24.7 pg — ABNORMAL LOW (ref 26.0–34.0)
MCHC: 28.7 g/dL — ABNORMAL LOW (ref 30.0–36.0)
MCV: 85.9 fL (ref 80.0–100.0)
Platelets: 83 10*3/uL — ABNORMAL LOW (ref 150–400)
RBC: 3.89 MIL/uL (ref 3.87–5.11)
RDW: 22.3 % — ABNORMAL HIGH (ref 11.5–15.5)
WBC: 9.3 10*3/uL (ref 4.0–10.5)
nRBC: 0 % (ref 0.0–0.2)

## 2021-12-22 LAB — BASIC METABOLIC PANEL
Anion gap: 6 (ref 5–15)
BUN: <5 mg/dL — ABNORMAL LOW (ref 6–20)
CO2: 17 mmol/L — ABNORMAL LOW (ref 22–32)
Calcium: 8 mg/dL — ABNORMAL LOW (ref 8.9–10.3)
Chloride: 112 mmol/L — ABNORMAL HIGH (ref 98–111)
Creatinine, Ser: 0.63 mg/dL (ref 0.44–1.00)
GFR, Estimated: >60 mL/min (ref >60)
Glucose, Bld: 98 mg/dL (ref 70–99)
Potassium: 3.7 mmol/L (ref 3.5–5.1)
Sodium: 135 mmol/L (ref 135–145)

## 2021-12-22 LAB — VITAMIN B6: Vitamin B6: 2.3 ug/L — ABNORMAL LOW (ref 3.4–65.2)

## 2021-12-22 LAB — T3, FREE: T3, Free: 1.1 pg/mL — ABNORMAL LOW (ref 2.0–4.4)

## 2021-12-22 LAB — MAGNESIUM: Magnesium: 1.5 mg/dL — ABNORMAL LOW (ref 1.7–2.4)

## 2021-12-22 LAB — ZINC: Zinc: 44 ug/dL (ref 44–115)

## 2021-12-22 LAB — PROTIME-INR
INR: 1.6 — ABNORMAL HIGH (ref 0.8–1.2)
Prothrombin Time: 19.3 seconds — ABNORMAL HIGH (ref 11.4–15.2)

## 2021-12-22 MED ORDER — POTASSIUM CHLORIDE CRYS ER 20 MEQ PO TBCR
40.0000 meq | EXTENDED_RELEASE_TABLET | Freq: Every day | ORAL | 0 refills | Status: DC
Start: 2021-12-23 — End: 2022-02-25
  Filled 2021-12-22: qty 60, 30d supply, fill #0

## 2021-12-22 MED ORDER — RIFAXIMIN 550 MG PO TABS
550.0000 mg | ORAL_TABLET | Freq: Two times a day (BID) | ORAL | 0 refills | Status: DC
  Filled 2021-12-22: qty 60, 30d supply, fill #0

## 2021-12-22 MED ORDER — FOLIC ACID 1 MG PO TABS
1.0000 mg | ORAL_TABLET | Freq: Every day | ORAL | 0 refills | Status: DC
  Filled 2021-12-22: qty 30, 30d supply, fill #0

## 2021-12-22 MED ORDER — THIAMINE HCL 100 MG PO TABS
100.0000 mg | ORAL_TABLET | Freq: Every day | ORAL | 0 refills | Status: AC
  Filled 2021-12-22: qty 30, 30d supply, fill #0

## 2021-12-22 MED ORDER — LACTULOSE ENCEPHALOPATHY 10 GM/15ML PO SOLN
30.0000 g | Freq: Three times a day (TID) | ORAL | 0 refills | Status: DC
  Filled 2021-12-22: qty 1892, 14d supply, fill #0

## 2021-12-22 MED ORDER — MAGNESIUM SULFATE 2 GM/50ML IV SOLN: 2.0000 g | Freq: Once | INTRAVENOUS | Status: DC

## 2021-12-22 MED ORDER — MAGNESIUM OXIDE 400 MG PO TABS
400.0000 mg | ORAL_TABLET | Freq: Every day | ORAL | 0 refills | Status: AC
Start: 2021-12-22 — End: 2022-01-21
  Filled 2021-12-22: qty 30, 30d supply, fill #0

## 2021-12-22 MED ORDER — FOLIC ACID 1 MG PO TABS
1.0000 mg | ORAL_TABLET | Freq: Every day | ORAL | Status: DC
  Administered 2021-12-22: 1 mg via ORAL
  Filled 2021-12-22: qty 1

## 2021-12-22 MED ORDER — MAGNESIUM SULFATE 2 GM/50ML IV SOLN
2.0000 g | Freq: Once | INTRAVENOUS | Status: AC
  Administered 2021-12-22: 2 g via INTRAVENOUS
  Filled 2021-12-22: qty 50

## 2021-12-22 MED ORDER — PANTOPRAZOLE SODIUM 40 MG PO TBEC
40.0000 mg | DELAYED_RELEASE_TABLET | Freq: Two times a day (BID) | ORAL | 0 refills | Status: DC
Start: 2021-12-22 — End: 2022-07-03
  Filled 2021-12-22: qty 60, 30d supply, fill #0

## 2021-12-22 NOTE — Progress Notes (Signed)
Family Medicine Teaching Service ?Daily Progress Note ?Intern Pager: [phone] ? ?Patient name: Joann [name] Medical record number: [id number] ?Date of birth: February 21, 1981 Age: 41 y.o. Gender: female ? ?Primary Care Provider: No primary care provider on file. ?Consultants: GI, Neurology (s/o) ?Code Status: full ? ?Pt Overview and Major Events to Date:  ?4/9 admit ? ?Assessment and Plan: ?Joann [name] is a 41 year old female presenting with 1 to 2 weeks of progressive confusion and agitation secondary to hepatic encephalopathy and alcohol withdrawal.  Past medical history significant for cirrhosis, hepatic encephalopathy, alcohol abuse, alcoholic pancreatitis, esophageal varices ? ?Hepatic encephalopathy ?Alcohol cirrhosis ?Mentation continues to improve. Appears more participatory with family member around. INR 1.6 ?-Continue CIWA protocol ?-Safety observation ?-Protonix 40 mg BID ?-Lactulose 20 QID PO/enema ?-Xifaxan 550 mg BID ?-TOC for substance abuse counseling ?-Thiamine/Folate/MVI ?-GI following, appreciate recommendations ?-RD consulted, appreciate recs ? ?Anemia ?Concern for upper GI bleed ?Thrombocytopenia ?Elevated INR ?Hgb 9.8>9.6. Platelet 80>83. ? ?Hypokalemia ?Hypomagnesia ?K 3.6, Mg 1.5 ?-IV Mg Sulfate 2 g in AM and PM ?-Klor Con tablet 40 meq daily ? ?FEN/GI: regular ?PPx: lovenox ?Dispo:Alcohol rehab in 2-3 days. Barriers include encephalopathy.  ? ?Subjective:  ?Patient seen and assessed at bedside.  She is frustrated and wants to leave; however, after discussing her condition and our concerns, she was willing to stay in hospital and continue with care.  Husband at bedside. ? ?Objective: ?Temp:  [98 ?F (36.7 ?C)-98.7 ?F (37.1 ?C)] 98.6 ?F (37 ?C) (04/14 0256) ?Pulse Rate:  [85-96] 90 (04/14 0256) ?Resp:  [16-18] 18 (04/14 0017) ?BP: (109-132)/(71-88) 128/73 (04/14 0256) ?SpO2:  [99 %-100 %] 99 % (04/14 0256) ?Physical Exam: ?General: Sitting up, eating breakfast. ?Cardiovascular: RRR ?Respiratory:  CTAB ?Abdomen: soft, nontender ?Extremities: warm, dry ? ?Laboratory: ?Recent Labs  ?Lab 12/20/21 ?1572 12/21/21 ?0231 12/22/21 ?0158  ?WBC 10.2 7.8 9.3  ?HGB 10.1* 9.8* 9.6*  ?HCT 34.4* 32.4* 33.4*  ?PLT 85* 80* 83*  ? ?Recent Labs  ?Lab 12/19/21 ?0840 12/20/21 ?6203 12/21/21 ?0231 12/22/21 ?0158  ?NA 139 141 136 135  ?K 3.3* 3.8 3.6 3.7  ?CL 116* 117* 112* 112*  ?CO2 18* 17* 17* 17*  ?BUN <5* <5* <5* <5*  ?CREATININE 0.65 0.68 0.56 0.63  ?CALCIUM 8.4* 8.6* 7.9* 8.0*  ?PROT 8.0 9.1* 7.6  --   ?BILITOT 5.1* 4.6* 3.3*  --   ?ALKPHOS 130* 143* 132*  --   ?ALT 19 21 18   --   ?AST 90* 82* 61*  --   ?GLUCOSE 117* 135* 85 98  ? ? ? ? ?Imaging/Diagnostic Tests: ?No results found. ? ?France Ravens, MD ?12/22/2021, 6:37 AM ?PGY-1, [place] [facility] ?[place] Intern pager: [phone], text pages welcome ?

## 2021-12-22 NOTE — Plan of Care (Signed)
?  Problem: Education: ?Goal: Knowledge of General Education information will improve ?Description: Including pain rating scale, medication(s)/side effects and non-pharmacologic comfort measures ?Outcome: Progressing ?  ?Problem: Health Behavior/Discharge Planning: ?Goal: Ability to manage health-related needs will improve ?Outcome: Progressing ?  ?Problem: Clinical Measurements: ?Goal: Ability to maintain clinical measurements within normal limits will improve ?Outcome: Progressing ?Goal: Will remain free from infection ?Outcome: Progressing ?Goal: Diagnostic test results will improve ?Outcome: Progressing ?Goal: Respiratory complications will improve ?Outcome: Progressing ?Goal: Cardiovascular complication will be avoided ?Outcome: Progressing ?  ?Problem: Activity: ?Goal: Risk for activity intolerance will decrease ?Outcome: Progressing ?  ?Problem: Nutrition: ?Goal: Adequate nutrition will be maintained ?Outcome: Progressing ?  ?Problem: Coping: ?Goal: Level of anxiety will decrease ?Outcome: Progressing ?  ?Problem: Elimination: ?Goal: Will not experience complications related to bowel motility ?Outcome: Progressing ?Goal: Will not experience complications related to urinary retention ?Outcome: Progressing ?  ?Problem: Safety: ?Goal: Ability to remain free from injury will improve ?Outcome: Progressing ?  ?Problem: Skin Integrity: ?Goal: Risk for impaired skin integrity will decrease ?Outcome: Progressing ?  ?Problem: Safety: ?Goal: Non-violent Restraint(s) ?Outcome: Progressing ?  ?

## 2021-12-22 NOTE — Telephone Encounter (Signed)
Amy, please contact patient to schedule her for a follow-up appointment with Dr. Candis Schatz as she will need a outpatient EGD.  She will be discharged home from the hospital today. ? ?Please send her to our lab in 1 week to have a CBC, BMP, hepatic panel and INR level.  Thank you ?

## 2021-12-22 NOTE — Telephone Encounter (Signed)
Per Carl Best, CRNP's request, pt has been scheduled for hosp f/u with Dr. Candis Schatz on 01/09/22 @ 340pm. Appt reminder has been mailed. Appt will reflect on AVS upon hosp discharge for pt future reference. Orders placed per [facility], CRNP's request. In addition, there is no contact # on file in which to call pt about need for labs in 1 week. Informed Carl Best, CRNP.  ? ?

## 2021-12-22 NOTE — Telephone Encounter (Signed)
Called Emergency Contact (spouse) # [phone]. Asked for updated contact #. Provided with [phone]. Demos updated to reflect. Informed Carl Best, CRNP as well. ?

## 2021-12-22 NOTE — Progress Notes (Signed)
TOC pharmacy came up with a second drop of of home meds after pt had been discharged.  Discharge RN was unaware that more meds were still to be delivered.  Tried to catch pt at main entrance but she had already left.  Also tried to contact via phone with no answer. Secretary was ultimately able to reach pt's mother, who was also her transport home.  There is no intention on the family's part to come back for medications today.  "Maybe tomorrow". ? ?As per [facility] pharmacy, meds must be returned to them.  They are not here on the weekend but if pt would like they could transfer to another pharmacy.  Was able to reach pt's via mother's phone again and she confirmed that she will be able to pick up meds at [facility] on [address] In [place]. TOC notified and will transfer. ?

## 2021-12-22 NOTE — Discharge Summary (Addendum)
Family Medicine Teaching Service ?Hospital Discharge Summary ? ?Patient name: Joann [name] Medical record number: [id number] ?Date of birth: 1980-12-21 Age: 41 y.o. Gender: female ?Date of Admission: 12/17/2021  Date of Discharge: 12/22/21 ?Admitting Physician: Eppie Gibson, MD ? ?Primary Care Provider: No primary care provider on file. ?Consultants: GI, Neurology ? ?Indication for Hospitalization: Hepatic Encephalopathy ? ?Discharge Diagnoses/Problem List:  ?Hepatic encephalopathy ?Alcohol cirrhosis ?Alcohol use disorder ?Chronic blood loss anemia ?Hypokalemia ?Hypomagnesia ? ?Disposition: Home ? ?Discharge Condition: Stable ? ?Discharge Exam:  ?Blood pressure 112/75, pulse 85, temperature 98.7 ?F (37.1 ?C), temperature source Oral, resp. rate 19, height 5' 4"  (1.626 m), weight 69.6 kg, SpO2 100 %. ?General: Resting comfortably, NAD ?Cardiovascular: RRR ?Respiratory: CTAB ?Abdomen: soft, nontender ?Extremities: warm, dry ? ?Brief Hospital Course:  ?Joann [name] is a 41 y.o.female with a history of alcoholic cirrhosis, hepatic encephalopathy, alcohol abuse, alcoholic pancreatitis, esophageal varices who was admitted to the [facility] Teaching Service at [facility] for progressive confusion and agitation secondary to hepatic encephalopathy. Her hospital course is detailed below: ? ?Hepatic encephalopathy ?Alcohol cirrhosis ?Patient admitted with concerns of hepatic encephalopathy given ammonia of 198.  Patient was started on lactulose 20 TIDand Xifaxan 550 mg twice daily with goal of 3-4 loose stools per day.  CIWA protocol with Ativan was also added given her alcohol history and uncertain last drink.  Patient was also continued on Rocephin IV for SBP prophylaxis for 5 days but discontinued given no concern for esophageal bleed.  MELD score 20, Child-Pugh class C.  Given continued encephalopathy and high bleed risk given her cirrhosis- noncontrast CT head was performed and negative. GI and neurology were consulted given  patient's persistent encephalopathy. She was briefly placed on EEG monitoring to rule out seizures but did not tolerate this and pulled off the leads. She required soft retraints and posey belt while she was very encephalopathic. There was consideration for further brain imaging to evaluate for dural venous thrombosis but given her slowly improved mentation over the following days this was thought to be less likely and not pursued. She continued on regimen of lactulose 20 mg 4 times daily and Xifaxan 550 mg twice daily.  Patient was continued on thiamine, folate, magnesium, potassium, multivitamin, and Protonix 40 mg twice daily due to persistent vitamin and electrolyte deficiencies.  Patient is to have outpatient EGD. This was not performed inpatient as her hemoglobin remained stable after repletion of PRBC on admission without any signs of bleeding. ? ?Anemia ?Thrombocytopenia ?Elevated INR ?Patient's hemoglobin on admission was 4.4.  Patient received 4 units packed red blood cells on first night of admission.  Her hemoglobin remained stable through the remainder of hospitalization. She has a history of esophageal varices and there was consideration for EGD during hospitalization. Given her stable hemoglobin and improved mental state, she seemed appropriate for outpatient EGD to follow up on her varices. Discharge hemoglobin was 9.6. ? ?Electrolyte derangement ?Potassium initially less than 2.0 and magnesium 1.6.  Magnesium and potassium were appropriately repleted. She required daily repletion and this was likely due to her bowel regimen of Lactulose and Xifaxan. She was discharged on magnesium oxide tablets and Klor-Con. ? ?Other chronic conditions were medically managed with home medications and formulary alternatives as necessary (insomnia) ? ?PCP Follow-up Recommendations: ?-CMP to assess liver function, electrolyte derrangements ?-Magnesium level to assess if appropriately repleting ?-Medication compliance  and ensuring 3-4 regular bowel movements ?-Continue to encourage complete alcohol cessation. Assist with substance use resources, AA meetings,  medication therapy to assist, etc.  ? ?Significant Procedures: none ? ?Significant Labs and Imaging:  ?Recent Labs  ?Lab 12/20/21 ?9371 12/21/21 ?0231 12/22/21 ?0158  ?WBC 10.2 7.8 9.3  ?HGB 10.1* 9.8* 9.6*  ?HCT 34.4* 32.4* 33.4*  ?PLT 85* 80* 83*  ? ?Recent Labs  ?Lab 12/17/21 ?1452 12/17/21 ?1700 12/18/21 ?0636 12/19/21 ?0840 12/20/21 ?6967 12/21/21 ?0231 12/22/21 ?0158  ?NA 136  --  138 139 141 136 135  ?K 2.0*  --  3.0* 3.3* 3.8 3.6 3.7  ?CL 106  --  112* 116* 117* 112* 112*  ?CO2 23  --  20* 18* 17* 17* 17*  ?GLUCOSE 103*  --  130* 117* 135* 85 98  ?BUN <5*  --  <5* <5* <5* <5* <5*  ?CREATININE 0.78  --  0.66 0.65 0.68 0.56 0.63  ?CALCIUM 8.3*  --  7.9* 8.4* 8.6* 7.9* 8.0*  ?MG  --    < > 1.9 1.6* 1.5* 1.4* 1.5*  ?ALKPHOS 149*  --  133* 130* 143* 132*  --   ?AST 73*  --  62* 90* 82* 61*  --   ?ALT 18  --  18 19 21 18   --   ?ALBUMIN 3.1*  --  2.6* 2.7* 3.0* 2.6*  --   ? < > = values in this interval not displayed.  ? ?Results/Tests Pending at Time of Discharge: none ? ?Discharge Medications:  ?Allergies as of 12/22/2021   ? ?   Reactions  ? Latex Hives  ? Patient stated that she is allergic to latex  ? ?  ? ?  ?Medication List  ?  ? ?STOP taking these medications   ? ?magnesium gluconate 30 MG tablet ?Commonly known as: MAGONATE ?  ?midodrine 5 MG tablet ?Commonly known as: PROAMATINE ?  ?ondansetron 4 MG disintegrating tablet ?Commonly known as: Zofran ODT ?  ?Potassium Chloride ER 20 MEQ Tbcr ?  ?prednisoLONE 5 MG Tabs tablet ?  ?traZODone 150 MG tablet ?Commonly known as: DESYREL ?  ?traZODone 50 MG tablet ?Commonly known as: DESYREL ?  ?venlafaxine 75 MG tablet ?Commonly known as: EFFEXOR ?  ? ?  ? ?TAKE these medications   ? ?folic acid 1 MG tablet ?Commonly known as: FOLVITE ?Take 1 tablet (1 mg total) by mouth daily. ?  ?lactulose (encephalopathy) 10 GM/15ML  Soln ?Commonly known as: Daguao ?Take 45 mLs (30 g total) by mouth every 8 (eight) hours. ?  ?magnesium oxide 400 MG tablet ?Commonly known as: MAG-OX ?Take 1 tablet (400 mg total) by mouth daily. ?  ?multivitamin with minerals Tabs tablet ?Take 1 tablet by mouth daily. ?  ?pantoprazole 40 MG tablet ?Commonly known as: PROTONIX ?Take 1 tablet (40 mg total) by mouth 2 (two) times daily. ?  ?potassium chloride SA 20 MEQ tablet ?Commonly known as: KLOR-CON M ?Take 2 tablets (40 mEq total) by mouth daily. ?Start taking on: December 23, 2021 ?  ?thiamine 100 MG tablet ?Take 1 tablet (100 mg total) by mouth daily. ?  ?Xifaxan 550 MG Tabs tablet ?Generic drug: rifaximin ?Take 1 tablet (550 mg total) by mouth 2 (two) times daily. ?  ?zinc sulfate 220 (50 Zn) MG capsule ?Take 1 capsule (220 mg total) by mouth daily. ?  ? ?  ? ? ?Discharge Instructions: Please refer to Patient Instructions section of EMR for full details.  Patient was counseled important signs and symptoms that should prompt return to medical care, changes in medications, dietary instructions, activity restrictions, and  follow up appointments.  ? ?Follow-Up Appointments: ? Follow-up Information   ? ? Daryel November, MD Follow up.   ?Specialty: Gastroenterology ?Contact information: ?[address] ?[place] [address] ?[phone] ? ? ?  ?  ? ?  ?  ? ?  ? ? ?France Ravens, MD  ?12/22/2021, 2:13 PM  ?PGY-1, [place] ? ?FPTS Upper-Level Resident Addendum ?  ?I have independently interviewed and examined the patient. I have discussed the above with the original author and agree with their documentation. My edits for correction/addition/clarification are included where appropriate. Please see also any attending notes.  ? ?Sharion Settler, DO ?PGY-2, [facility] ?12/22/2021 3:55 PM  ?[place] Service pager: [phone] (text pages welcome through [facility]) ? ?

## 2021-12-22 NOTE — Progress Notes (Addendum)
? ? ? ?[facility] Progress Note ? ?CC:   Anemia, cirrhosis ? ?Subjective: She is sitting up in the bed eating her lunch.  No nausea or vomiting.  No abdominal pain.  She passed to light brown soft to loose stools earlier today.  No rectal bleeding or black stools.  No family at the bedside. ? ?Objective:  ?Vital signs in last 24 hours: ?Temp:  [98 ?F (36.7 ?C)-98.7 ?F (37.1 ?C)] 98.7 ?F (37.1 ?C) (04/14 0930) ?Pulse Rate:  [85-96] 91 (04/14 0930) ?Resp:  [18] 18 (04/14 0930) ?BP: (104-128)/(71-88) 104/73 (04/14 0930) ?SpO2:  [99 %-100 %] 100 % (04/14 0930) ?Last BM Date : 12/21/21 ? ?General: 41 year old female sitting up in bed alert and oriented. ?Heart: Regular rate and rhythm, no murmurs. ?Pulm: Breath sounds clear throughout. ?Abdomen: Soft, nondistended.  Nontender.  No ascites ?Extremities:  Without edema. ?Neurologic:  Alert and  oriented x 4. Grossly normal neurologically.  No asterixis.  Answers questions appropriately.  Speech is clear. ?Psych:  Alert and cooperative. Normal mood and affect. ? ?Intake/Output from previous day: ?04/13 0701 - 04/14 0700 ?In: 1970 [P.O.:840; I.V.:1070; IV Piggyback:60] ?Out: 200 [Urine:200] ?Intake/Output this shift: ?Total I/O ?In: 480 [P.O.:480] ?Out: -  ? ?Lab Results: ?Recent Labs  ?  12/20/21 ?5638 12/21/21 ?0231 12/22/21 ?0158  ?WBC 10.2 7.8 9.3  ?HGB 10.1* 9.8* 9.6*  ?HCT 34.4* 32.4* 33.4*  ?PLT 85* 80* 83*  ? ?BMET ?Recent Labs  ?  12/20/21 ?0652 12/21/21 ?0231 12/22/21 ?0158  ?NA 141 136 135  ?K 3.8 3.6 3.7  ?CL 117* 112* 112*  ?CO2 17* 17* 17*  ?GLUCOSE 135* 85 98  ?BUN <5* <5* <5*  ?CREATININE 0.68 0.56 0.63  ?CALCIUM 8.6* 7.9* 8.0*  ? ?LFT ?Recent Labs  ?  12/21/21 ?0231  ?PROT 7.6  ?ALBUMIN 2.6*  ?AST 61*  ?ALT 18  ?ALKPHOS 132*  ?BILITOT 3.3*  ? ?PT/INR ?Recent Labs  ?  12/21/21 ?0231 12/22/21 ?0158  ?LABPROT 19.8* 19.3*  ?INR 1.7* 1.6*  ? ?Hepatitis Panel ?No results for input(s): HEPBSAG, HCVAB, HEPAIGM, HEPBIGM in the last 72 hours. ? ?No  results found. ? ?Assessment / Plan: ? ?21) 41 year old female with alcohol associated cirrhosis, esophageal varices and hepatic encephalopathy.  RUQ sono consistent with cirrhosis, patent portal vein without evidence of any concerning liver lesion/hepatoma. MELD 20. She is not a liver transplant candidate secondary to ongoing alcohol use.  Total bili 3.3.  Alk phos 132.  AST 61.  ALT 18.  Hepatic encephalopathy improved after aggressive lactulose and high-dose thiamine.  She is much more awake today conversant without agitation. ?-Continue Xifaxan 550 mg p.o. twice daily  ?-Continue Lactulose 20 g p.o. every 6 hours, titrate to no more than 3-4 loose bowel movements daily ?-Continue to hold Ativan ?-Continue thiamine, folic acid and multivitamin daily ?-Ondansetron 4 mg p.o./IV every 6 hours as needed ?-2 gm low sodium diet as tolerated  ?-Patient counseled no alcohol ever, strongly encourage inpatient alcohol rehab ?-EGD to survey for esophageal varices as an outpatient ?-Okay to discharge home from GI standpoint ?  ?2) Iron deficiency anemia, likely due to epistaxis and bone marrow suppression secondary to alcohol use.  No overt GI bleeding.  FOBT negative. Admission Hg 4.4 -> Transfused 4 units of PRBCs -> 9.4 -> Hg 10.4 -> 10.1 -> 9.8 -> 9.6. No overt GI bleeding.  ?-Continue Pantoprazole 40 mg IV twice daily ?-Transfuse for hemoglobin < 7 ?-Eventual colonoscopy, as an outpatient ?  ?  3) Thrombocytopenia. PLT 80.  ?  ?4) Coagulopathy secondary to cirrhosis. INR 1.6. ?  ? ?Principal Problem: ?  Symptomatic anemia ?Active Problems: ?  Hepatic encephalopathy ([place]) ? ? ? ? LOS: 5 days  ? ?Joann [name]  12/22/2021, 11:52 AM ? ?I agree with the APP's note, impression and recommendations.  The patient was discharged home before I had a chance to see her, however. ? ?Wing Gfeller E. Candis Schatz, MD ?[facility] ? ? ?

## 2021-12-22 NOTE — Progress Notes (Signed)
Discharge information reviewed with patient.  She expresses understanding of follow up appointments and to drop in to GI for labs next week.  TOC has dropped off meds.  No other questions at this time. ?

## 2021-12-22 NOTE — Discharge Instructions (Addendum)
Dear Ms. Lukasik, ? ?It was a pleasure taking care of you while you are in the hospital.  You were admitted due to liver dysfunction that was causing you to feel very sleepy.  This is likely due to your previous alcohol use. Also, please follow-up with GI to have EGD and continue to assess your liver function.  If your condition worsens, please return to the hospital or call your primary care provider. Please refrain from any alcohol use.  ? ?Dr. Candis Schatz, your GI doctor will arrange for EGD as well as his office will call you for follow-up appointment.  Please follow-up with hepatology with Roosevelt Locks NP at [facility] as soon as possible. ? ?Take care! ?-Zacarias Pontes FPTS ?

## 2021-12-23 LAB — VITAMIN A: Vitamin A (Retinoic Acid): 2.7 ug/dL — ABNORMAL LOW (ref 20.1–62.0)

## 2021-12-23 LAB — VITAMIN C: Vitamin C: 0.8 mg/dL (ref 0.4–2.0)

## 2021-12-24 ENCOUNTER — Encounter: Payer: Self-pay | Admitting: Family Medicine

## 2021-12-25 NOTE — Telephone Encounter (Signed)
Hi Dr. Rush Landmark, ? ?Please refer to communications below. Given error, I have canceled appt with Dr. Candis Schatz as requested. Review of your schedule indicates your next OV f/u is 1st week of June. Are you okay with pt waiting this long or would you prefer pt be scheduled with first available APP? ?

## 2021-12-25 NOTE — Telephone Encounter (Signed)
Called pt and informed about scheduling error. Pt has been rescheduled with Nicoletta Ba, PA-C on 01/09/22 @ 9am. Pt has been reminded to ensure she has completed her labs on 4/21. Verbalized acceptance and understanding. ?

## 2021-12-25 NOTE — Telephone Encounter (Signed)
Patient needs to follow up with [facility] most importantly. ?CKS saw her more recently in the hospital, will defer timing as to when she thinks necessary and any follow up labs. ?They can be placed under my name though. ?OK for APP clinic visit, since I am booked until then. ?Thanks. ?GM ?

## 2021-12-26 NOTE — Telephone Encounter (Signed)
Called Joann [name] and provided her with [facility]'s contact # for scheduling purposes. Advised it appears from their documentation they had tried to reach out to her to schedule an appt and sent letter as well. Joann [name] admitted she was aware and states she intends to call this week to schedule her appt. Reminder created to ensure appt has been scheduled. ?

## 2021-12-26 NOTE — Telephone Encounter (Signed)
Joann [name], thank you for coordinating her outpatient follow up. Please make sure she follows up with Roosevelt Locks NP at [facility] as requested by Dr. Rush Landmark. THX. ?

## 2021-12-28 NOTE — Telephone Encounter (Signed)
At the time of this entry,pt has not yet completed labs. Called pt to provide her with a reminder. LVM requesting returned call. Also sent My Chart message (refer to separate encounter). ?

## 2021-12-29 NOTE — Telephone Encounter (Signed)
Pt responded to My Chart message indicating she plans to have labs obtained on Monday. Also states she is scheduled to see [facility] on 5/26. ?

## 2021-12-29 NOTE — Telephone Encounter (Signed)
At the time of this entry, pt had not read My Chart message sent 4/20 nor has completed labs. Called pt to provide with verbal reminder. LVM requesting returned call. ?

## 2022-01-01 ENCOUNTER — Other Ambulatory Visit (INDEPENDENT_AMBULATORY_CARE_PROVIDER_SITE_OTHER): Payer: Medicaid Other

## 2022-01-01 DIAGNOSIS — K7010 Alcoholic hepatitis without ascites: Secondary | ICD-10-CM | POA: Diagnosis not present

## 2022-01-01 LAB — HEPATIC FUNCTION PANEL
ALT: 9 U/L (ref 0–35)
AST: 49 U/L — ABNORMAL HIGH (ref 0–37)
Albumin: 3.2 g/dL — ABNORMAL LOW (ref 3.5–5.2)
Alkaline Phosphatase: 98 U/L (ref 39–117)
Bilirubin, Direct: 1.2 mg/dL — ABNORMAL HIGH (ref 0.0–0.3)
Total Bilirubin: 2.4 mg/dL — ABNORMAL HIGH (ref 0.2–1.2)
Total Protein: 7.8 g/dL (ref 6.0–8.3)

## 2022-01-01 LAB — BASIC METABOLIC PANEL
BUN: 5 mg/dL — ABNORMAL LOW (ref 6–23)
CO2: 27 mEq/L (ref 19–32)
Calcium: 8.3 mg/dL — ABNORMAL LOW (ref 8.4–10.5)
Chloride: 101 mEq/L (ref 96–112)
Creatinine, Ser: 0.62 mg/dL (ref 0.40–1.20)
GFR: 109.72 mL/min (ref >60.00)
Glucose, Bld: 134 mg/dL — ABNORMAL HIGH (ref 70–99)
Potassium: 2.7 mEq/L — CL (ref 3.5–5.1)
Sodium: 136 mEq/L (ref 135–145)

## 2022-01-01 LAB — CBC
HCT: 29.1 % — ABNORMAL LOW (ref 36.0–46.0)
Hemoglobin: 9.2 g/dL — ABNORMAL LOW (ref 12.0–15.0)
MCHC: 31.6 g/dL (ref 30.0–36.0)
MCV: 81.2 fl (ref 78.0–100.0)
Platelets: 119 10*3/uL — ABNORMAL LOW (ref 150.0–400.0)
RBC: 3.58 Mil/uL — ABNORMAL LOW (ref 3.87–5.11)
RDW: 22.6 % — ABNORMAL HIGH (ref 11.5–15.5)
WBC: 5.1 10*3/uL (ref 4.0–10.5)

## 2022-01-01 LAB — PROTIME-INR
INR: 1.7 ratio — ABNORMAL HIGH (ref 0.8–1.0)
Prothrombin Time: 18 s — ABNORMAL HIGH (ref 9.6–13.1)

## 2022-01-01 NOTE — Telephone Encounter (Signed)
At the time of this entry, pt has completed labs. Awaiting review and recommendations. ?

## 2022-01-02 ENCOUNTER — Other Ambulatory Visit: Payer: Self-pay

## 2022-01-02 DIAGNOSIS — E876 Hypokalemia: Secondary | ICD-10-CM

## 2022-01-03 ENCOUNTER — Other Ambulatory Visit (INDEPENDENT_AMBULATORY_CARE_PROVIDER_SITE_OTHER): Payer: Medicaid Other

## 2022-01-03 ENCOUNTER — Other Ambulatory Visit: Payer: Self-pay

## 2022-01-03 DIAGNOSIS — K7010 Alcoholic hepatitis without ascites: Secondary | ICD-10-CM

## 2022-01-03 DIAGNOSIS — E876 Hypokalemia: Secondary | ICD-10-CM

## 2022-01-03 LAB — BASIC METABOLIC PANEL
BUN: 3 mg/dL — ABNORMAL LOW (ref 6–23)
CO2: 23 mEq/L (ref 19–32)
Calcium: 7.8 mg/dL — ABNORMAL LOW (ref 8.4–10.5)
Chloride: 107 mEq/L (ref 96–112)
Creatinine, Ser: 0.59 mg/dL (ref 0.40–1.20)
GFR: 111.04 mL/min (ref >60.00)
Glucose, Bld: 153 mg/dL — ABNORMAL HIGH (ref 70–99)
Potassium: 2.9 mEq/L — ABNORMAL LOW (ref 3.5–5.1)
Sodium: 137 mEq/L (ref 135–145)

## 2022-01-03 MED ORDER — LACTULOSE ENCEPHALOPATHY 10 GM/15ML PO SOLN: 30.0000 g | Freq: Two times a day (BID) | ORAL | 0 refills | Status: AC

## 2022-01-09 ENCOUNTER — Ambulatory Visit: Payer: Medicaid Other | Admitting: Gastroenterology

## 2022-01-09 ENCOUNTER — Other Ambulatory Visit (INDEPENDENT_AMBULATORY_CARE_PROVIDER_SITE_OTHER): Payer: Medicaid Other

## 2022-01-09 ENCOUNTER — Ambulatory Visit: Payer: Medicaid Other | Admitting: Physician Assistant

## 2022-01-09 DIAGNOSIS — E876 Hypokalemia: Secondary | ICD-10-CM | POA: Diagnosis not present

## 2022-01-09 LAB — BASIC METABOLIC PANEL
BUN: 4 mg/dL — ABNORMAL LOW (ref 6–23)
CO2: 25 mEq/L (ref 19–32)
Calcium: 8.4 mg/dL (ref 8.4–10.5)
Chloride: 106 mEq/L (ref 96–112)
Creatinine, Ser: 0.62 mg/dL (ref 0.40–1.20)
GFR: 109.7 mL/min (ref >60.00)
Glucose, Bld: 85 mg/dL (ref 70–99)
Potassium: 3.6 mEq/L (ref 3.5–5.1)
Sodium: 137 mEq/L (ref 135–145)

## 2022-01-11 ENCOUNTER — Encounter: Payer: Self-pay | Admitting: Nurse Practitioner

## 2022-01-11 ENCOUNTER — Ambulatory Visit (INDEPENDENT_AMBULATORY_CARE_PROVIDER_SITE_OTHER): Payer: Medicaid Other | Admitting: Nurse Practitioner

## 2022-01-11 VITALS — BP 102/70 | HR 83 | Ht 63.5 in | Wt 160.1 lb

## 2022-01-11 DIAGNOSIS — D649 Anemia, unspecified: Secondary | ICD-10-CM | POA: Diagnosis not present

## 2022-01-11 DIAGNOSIS — K7010 Alcoholic hepatitis without ascites: Secondary | ICD-10-CM | POA: Diagnosis not present

## 2022-01-11 NOTE — Progress Notes (Signed)
? ? ? ?01/13/2022 ?Joann [name] ?[id number] ?[birth year] ? ? ?Chief Complaint: Cirrhosis follow up ? ?History of Present Illness: Joann [name] is a 41 y.o. 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 Past cholecystectomy. ? ?She was admitted to the hospital 12/17/2021 secondary to worsening hepatic encephalopathy. At that time, her husband reported she had been abstinent from alcohol for almost 2 weeks. Labs in the ED showed profound anemia with a Hg level of 4.4. Transfused 4 units of PRBCs. FOBT negative. She was placed on PPI and Octreotide infusion.  She was started on Rocephin 2 g IV prophylaxis. PLT 66. INR 1.5. K + < 2.0. Received KCL IV. Her husband reported she vomited dark brown liquid a few and she also had a few nose bleeds recently at home. No overt GI bleeding during her hospital admission. She became obtunded, required lactulose enema x 1 and oral Lactulose qid which was challenging to manage in the setting of episodic agitation requiring conservative administration of Ativan IV and soft wrist restraints.  Head CT was negative. Neurology was consulted. EEG to rule out seizures was unsuccessful as she pulled off the leads. She received high dose Thiamine. Her hepatic encephalopathy significantly improved and her clinical status stabilized. She was discharged home on 12/22/2021 with plans to follow up in our office in a few weeks and to schedule an EGD and colonoscopy. Discharge Hg was 9.6. ? ?Currently, she denies having any confusion or mental fogginess. 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 She endorses being compliant with taking Xifaxan 553m bid and Lactulose tid. She was recently instructed to reduce her Lactulose to bid dosing due to  persistent hypokalemia ( K+ 2.7).  However, she stated she continued to taking Lactulose tid  because she felt better on this dose frequency which produces 2 to 3 loose light brown stools daily. She denies having large volume diarrhea. She was prescribed KCL 40 meq in the am and 214m in the pm for 3 days then KCL 2076mpo bid with resolution of her hypokalemia. K+ 3.6. We referred her to nephrology to rule out kidney dysfunction as a possible contributing factor to her hypokalemia, however, Dr. GolMoshe Ciproviewed her records and thought her hypokalemia was likely a nutritional issue opposed to renal therefore nephrology consult was deferred. Bun and creatinine levels remain normal. She remains abstinent from alcohol since 12/09/2021. She has not pursued alcohol rehab. She is eating 2 to 3 low sodium meals daily. No N/V or abdominal pain. She has some abdominal bloat, no abdominal swelling or noticeable change in abdominal girth. She reported having a little swelling to her legs. Urine is normal yellow color. No significant weight gain. No CP or SOB. No other complaints at this time.  ? ? ?  Latest Ref Rng & Units 01/09/2022  ? 11:13 AM 01/03/2022  ? 11:11 AM 01/01/2022  ?  9:35 AM  ?CMP  ?Glucose 70 - 99 mg/dL 85   153   134    ?BUN 6 - 23 mg/dL 4   3   5     ?Creatinine 0.40 - 1.20 mg/dL 0.62   0.59   0.62    ?Sodium 135 - 145 mEq/L 137   137   136    ?Potassium 3.5 - 5.1 mEq/L  3.6   2.9   2.7    ?Chloride 96 - 112 mEq/L 106   107   101    ?CO2 19 - 32 mEq/L 25   23   27     ?Calcium 8.4 - 10.5 mg/dL 8.4   7.8   8.3    ?Total Protein 6.0 - 8.3 g/dL   7.8    ?Total Bilirubin 0.2 - 1.2 mg/dL   2.4    ?Alkaline Phos 39 - 117 U/L   98    ?AST 0 - 37 U/L   49    ?ALT 0 - 35 U/L   9    ?  ? ?RUQ sonogram 12/17/2021: ?Gallbladder:Surgically absent ?  ?Common bile duct: Diameter: 3.1 mm ?  ?Liver: Echogenic nodular hepatic parenchyma consistent with cirrhosis. No ?gross focal hepatic abnormality. Portal vein is patent on  color ?Doppler imaging with normal direction of blood flow towards the ?liver. ?  ?Other: Recanalized umbilical vein. ?  ?IMPRESSION: ?Liver cirrhosis with probable portal hypertension ? ?EGD 12/27/2019: ?- Small (< 5 mm) esophageal varices. ?- A large amount of food (residue) in the stomach. ?- Normal examined duodenum. ?- No specimens collected. ? ?Current Outpatient Medications on File Prior to Visit  ?Medication Sig Dispense Refill  ? folic acid (FOLVITE) 1 MG tablet Take 1 tablet (1 mg total) by mouth daily. 30 tablet 0  ? lactulose, encephalopathy, (CHRONULAC) 10 GM/15ML SOLN Take 45 mLs (30 g total) by mouth 2 (two) times daily. 2700 mL 0  ? magnesium oxide (MAG-OX) 400 MG tablet Take 1 tablet (400 mg total) by mouth daily. 30 tablet 0  ? Multiple Vitamin (MULTIVITAMIN WITH MINERALS) TABS tablet Take 1 tablet by mouth daily.    ? pantoprazole (PROTONIX) 40 MG tablet Take 1 tablet (40 mg total) by mouth 2 (two) times daily. 60 tablet 0  ? potassium chloride SA (KLOR-CON M) 20 MEQ tablet Take 2 tablets (40 mEq total) by mouth daily. 60 tablet 0  ? rifaximin (XIFAXAN) 550 MG TABS tablet Take 1 tablet (550 mg total) by mouth 2 (two) times daily. 60 tablet 0  ? thiamine 100 MG tablet Take 1 tablet (100 mg total) by mouth daily. 30 tablet 0  ? traZODone (DESYREL) 150 MG tablet Take 150 mg by mouth at bedtime.    ? zinc sulfate 220 (50 Zn) MG capsule Take 1 capsule (220 mg total) by mouth daily. 30 capsule 0  ? ?No current facility-administered medications on file prior to visit.  ? ?Allergies  ?Allergen Reactions  ? Latex Hives  ?  Patient stated that she is allergic to latex  ? ? ?Current Medications, Allergies, Past Medical History, Past Surgical History, Family History and Social History were reviewed in [employer] record. ? ? ?Review of Systems:   ?Constitutional: Negative for fever, sweats, chills or weight loss.  ?Respiratory: Negative for shortness of breath.   ?Cardiovascular: + Leg  swelling.  ?Gastrointestinal: See HPI.  ?Musculoskeletal: Negative for back pain or muscle aches.  ?Neurological: Negative for dizziness, headaches or paresthesias.  ? ?Physical Exam: ? ?BP 102/70   Pulse 83   Ht 5' 3.5" (1.613 m) Comment: height measured without shoes  Wt 160 lb 2 oz (72.6 kg)   BMI 27.92 kg/m?  ? ?Wt Readings from Last 3 Encounters:  ?01/11/22 160 lb 2 oz (72.6 kg)  ?12/19/21 153 lb 7 oz (69.6 kg)  ?07/03/20 160 lb (72.6 kg)  ?  ? ?General:  41 year old female alert in NAD.  ?Head: Normocephalic and atraumatic. ?Eyes: Scant scleral icterus. Conjunctiva pink . ?Ears: Normal auditory acuity. ?Mouth: Dentition intact. No ulcers or lesions.  ?Lungs: Clear throughout to auscultation. ?Heart: Regular rate and rhythm, no murmur. ?Abdomen: Soft, nontender. Protuberant. Nondistended. No ascites. No masses or hepatomegaly. Normal bowel sounds x 4 quadrants.  ?Rectal: Deferred.  ?Musculoskeletal: Symmetrical with no gross deformities. ?Extremities: No lower extremity edema.  ?Neurological: Alert oriented x 4. No focal deficits. No asterixis.  ?Psychological: Alert and cooperative. Normal mood and affect ? ?Assessment and Recommendations: ?  ?2) 41 year old female with alcohol associated cirrhosis, esophageal varices and hepatic encephalopathy admitted to the hospital 12/17/2021 with recurrent hepatic encephalopathy. Admission labs: Ammonia 146. Total bili 8.4. Alk phos 143. AST 82. ALT 21. INR 1.5.  MELD 20.  Head CT was negative.  EEG showed moderate to severe diffuse encephalopathy without evidence of seizures.  She was evaluated by neurology who assessed her cognitive decline was likely due to hyperammonemia. She received a lactulose enema x 1, oral Lactulose qid and high dose Thiamine. Her hepatic encephalopathy significantly improved and she was discharged home on 12/22/2021. ?-Continue Xifaxan 550 mg p.o. twice daily  ?-Continue Lactulose po tid to titrate to 2 to 3 bowel movements daily. Patient  instructed to reduce Lactulose to bid if she has more than 3 loose stools daily. ?-Patient counseled no alcohol ever  ?-Continue thiamine, folic acid and multivitamin daily ?-2 gm low sodium diet  ?-BMP, hepatic panel, CBC a

## 2022-01-11 NOTE — Patient Instructions (Addendum)
1) Continue Xifaxan 550 mg one tab by mouth twice daily ? ?2) Continue Lactulose  (30gm) 45 ml two to three times daily, decrease if you are passing more than 3 loose stools daily  ? ?3) Continue KCL 41mq one tab by mouth twice daily  ? ?4) Continue Pantoprazole 435monce daily  ? ?5) Continue Folic Acid, multi vitamin and folate one tab daily ? ?6) Return to our lab in 2 weeks to have repeat labs done  ? ?7) Follow up in our office in 4 to 6 weeks We have scheduled you a follow up on 02/21/22 at 9:00 am ? ?8) Schedule an EGD and colonoscopy with Dr. MaRush Landmark?

## 2022-01-14 NOTE — Progress Notes (Signed)
Attending Physician's Attestation   I have reviewed the chart.   I agree with the Advanced Practitioner's note, impression, and recommendations with any updates as below.    Landon Bassford Mansouraty, MD  G[facility]ology [facility] Office # [phone]  

## 2022-02-14 ENCOUNTER — Emergency Department (HOSPITAL_COMMUNITY)
Admission: EM | Admit: 2022-02-14 | Discharge: 2022-02-14 | Disposition: A | Discharge status: Home / Self Care | Payer: Medicaid Other | Source: Home / Self Care | Attending: Emergency Medicine | Admitting: Emergency Medicine

## 2022-02-14 ENCOUNTER — Encounter (HOSPITAL_COMMUNITY): Payer: Self-pay

## 2022-02-14 ENCOUNTER — Other Ambulatory Visit: Payer: Self-pay

## 2022-02-14 DIAGNOSIS — E876 Hypokalemia: Secondary | ICD-10-CM | POA: Insufficient documentation

## 2022-02-14 DIAGNOSIS — K709 Alcoholic liver disease, unspecified: Secondary | ICD-10-CM | POA: Insufficient documentation

## 2022-02-14 DIAGNOSIS — F10920 Alcohol use, unspecified with intoxication, uncomplicated: Secondary | ICD-10-CM

## 2022-02-14 DIAGNOSIS — Y908 Blood alcohol level of 240 mg/100 ml or more: Secondary | ICD-10-CM | POA: Insufficient documentation

## 2022-02-14 DIAGNOSIS — R000 Tachycardia, unspecified: Secondary | ICD-10-CM | POA: Insufficient documentation

## 2022-02-14 DIAGNOSIS — Z91040 Latex allergy status: Secondary | ICD-10-CM | POA: Insufficient documentation

## 2022-02-14 DIAGNOSIS — F10120 Alcohol abuse with intoxication, uncomplicated: Secondary | ICD-10-CM | POA: Insufficient documentation

## 2022-02-14 LAB — CBC WITH DIFFERENTIAL/PLATELET
Abs Immature Granulocytes: 0.01 10*3/uL (ref 0.00–0.07)
Basophils Absolute: 0 10*3/uL (ref 0.0–0.1)
Basophils Relative: 1 %
Eosinophils Absolute: 0 10*3/uL (ref 0.0–0.5)
Eosinophils Relative: 0 %
HCT: 37.6 % (ref 36.0–46.0)
Hemoglobin: 11.2 g/dL — ABNORMAL LOW (ref 12.0–15.0)
Immature Granulocytes: 0 %
Lymphocytes Relative: 32 %
Lymphs Abs: 1.3 10*3/uL (ref 0.7–4.0)
MCH: 25.7 pg — ABNORMAL LOW (ref 26.0–34.0)
MCHC: 29.8 g/dL — ABNORMAL LOW (ref 30.0–36.0)
MCV: 86.2 fL (ref 80.0–100.0)
Monocytes Absolute: 0.3 10*3/uL (ref 0.1–1.0)
Monocytes Relative: 7 %
Neutro Abs: 2.5 10*3/uL (ref 1.7–7.7)
Neutrophils Relative %: 60 %
Platelets: 67 10*3/uL — ABNORMAL LOW (ref 150–400)
RBC: 4.36 MIL/uL (ref 3.87–5.11)
RDW: 18.8 % — ABNORMAL HIGH (ref 11.5–15.5)
WBC: 4.1 10*3/uL (ref 4.0–10.5)
nRBC: 0 % (ref 0.0–0.2)

## 2022-02-14 LAB — COMPREHENSIVE METABOLIC PANEL
ALT: 17 U/L (ref 0–44)
AST: 133 U/L — ABNORMAL HIGH (ref 15–41)
Albumin: 3.8 g/dL (ref 3.5–5.0)
Alkaline Phosphatase: 170 U/L — ABNORMAL HIGH (ref 38–126)
Anion gap: 18 — ABNORMAL HIGH (ref 5–15)
BUN: <5 mg/dL — ABNORMAL LOW (ref 6–20)
CO2: 17 mmol/L — ABNORMAL LOW (ref 22–32)
Calcium: 8.3 mg/dL — ABNORMAL LOW (ref 8.9–10.3)
Chloride: 108 mmol/L (ref 98–111)
Creatinine, Ser: 0.67 mg/dL (ref 0.44–1.00)
GFR, Estimated: >60 mL/min (ref >60)
Glucose, Bld: 114 mg/dL — ABNORMAL HIGH (ref 70–99)
Potassium: 2.8 mmol/L — ABNORMAL LOW (ref 3.5–5.1)
Sodium: 143 mmol/L (ref 135–145)
Total Bilirubin: 2 mg/dL — ABNORMAL HIGH (ref 0.3–1.2)
Total Protein: 10.3 g/dL — ABNORMAL HIGH (ref 6.5–8.1)

## 2022-02-14 LAB — I-STAT BETA HCG BLOOD, ED (MC, WL, AP ONLY): I-stat hCG, quantitative: <5 m[IU]/mL (ref <5)

## 2022-02-14 LAB — ETHANOL: Alcohol, Ethyl (B): 383 mg/dL (ref <10)

## 2022-02-14 MED ORDER — POTASSIUM CHLORIDE CRYS ER 20 MEQ PO TBCR
40.0000 meq | EXTENDED_RELEASE_TABLET | Freq: Once | ORAL | Status: AC
Start: 2022-02-14 — End: 2022-02-14
  Administered 2022-02-14: 40 meq via ORAL
  Filled 2022-02-14: qty 2

## 2022-02-14 NOTE — ED Provider Triage Note (Signed)
Emergency Medicine Provider Triage Evaluation Note  Joann [name] , a 41 y.o. female  was evaluated in triage.  Pt complains of alcohol use disorder.  Patient states she drinks about a gallon of vodka every morning.  She had half a gallon today, drove herself here to the hospital states she is here for detox.  Patient informed we do not do detox here, patient verbalized awareness. Worried about liver enzyme  Review of Systems  Per HPI Physical Exam  BP (!) 139/92   Pulse (!) 127   Temp 97.6 F (36.4 C) (Oral)   Resp 16   SpO2 100%  Gen:   Awake, no distress   Resp:  Normal effort  MSK:   Moves extremities without difficulty  Other:  Slurring words, no SI or HI  Medical Decision Making  Medically screening exam initiated at 12:17 PM.  Appropriate orders placed.  Joann [name] was informed that the remainder of the evaluation will be completed by another provider, this initial triage assessment does not replace that evaluation, and the importance of remaining in the ED until their evaluation is complete.  Stable to wait for evaluation and back.  Patient is aware we do not do detox.   Sherrill Raring, PA-C 02/14/22 1218

## 2022-02-14 NOTE — ED Provider Notes (Incomplete)
[place] EMERGENCY DEPARTMENT Provider Note   CSN: [id number] Arrival date & time: 02/14/22  1157     History {Add pertinent medical, surgical, social history, OB history to HPI:1} Chief Complaint  Patient presents with   Alcohol Problem    Joann [name] is a 41 y.o. female with a h/o alcohol use disorder (since 0093) and alcoholic cirrhosis presenting to the ED with a CC of wanting to detox. She reports previous detox resulted in seizures, tremors, and night terrors. Her last known drink was "a couple of hours ago." Today, she reports tremors, but denies any auditory or visual hallucinations. She reports black, tar bowel movements and episodes of epistaxis. Denies chest pain, SOB, headache or vision changes. No tobacco or illicit drug use.    Alcohol Problem       Home Medications Prior to Admission medications   Medication Sig Start Date End Date Taking? Authorizing Provider  folic acid (FOLVITE) 1 MG tablet Take 1 tablet (1 mg total) by mouth daily. 12/22/21   France Ravens, MD  Multiple Vitamin (MULTIVITAMIN WITH MINERALS) TABS tablet Take 1 tablet by mouth daily. 01/15/20   Angiulli, Lavon Paganini, PA-C  pantoprazole (PROTONIX) 40 MG tablet Take 1 tablet (40 mg total) by mouth 2 (two) times daily. 12/22/21   Erskine Emery, MD  potassium chloride SA (KLOR-CON M) 20 MEQ tablet Take 2 tablets (40 mEq total) by mouth daily. 12/23/21 01/22/22  France Ravens, MD  rifaximin (XIFAXAN) 550 MG TABS tablet Take 1 tablet (550 mg total) by mouth 2 (two) times daily. 12/22/21   Erskine Emery, MD  traZODone (DESYREL) 150 MG tablet Take 150 mg by mouth at bedtime. 01/11/22   [provider]  zinc sulfate 220 (50 Zn) MG capsule Take 1 capsule (220 mg total) by mouth daily. 01/15/20   Angiulli, Lavon Paganini, PA-C      Allergies    Latex    Review of Systems   Review of Systems  Physical Exam Updated Vital Signs BP 113/73 (BP Location: Left Arm)   Pulse (!) 102   Temp 97.6 F (36.4  C) (Oral)   Resp 18   SpO2 99%  Physical Exam Vitals and nursing note reviewed.  Constitutional:      General: She is not in acute distress. HENT:     Head: Normocephalic and atraumatic.     Nose: Nose normal.  Eyes:     General: Scleral icterus present.  Cardiovascular:     Rate and Rhythm: Normal rate and regular rhythm.     Pulses: Normal pulses.     Heart sounds: Normal heart sounds.  Pulmonary:     Effort: Pulmonary effort is normal. No respiratory distress.     Breath sounds: No wheezing.  Abdominal:     Palpations: Abdomen is soft.     Tenderness: There is abdominal tenderness.     Comments: Abdominal tenderness to palpation of RUQ. Hepatomegaly noted.  Musculoskeletal:     Cervical back: Normal range of motion.     Right lower leg: No edema.     Left lower leg: No edema.  Skin:    General: Skin is warm and dry.     Capillary Refill: Capillary refill takes less than 2 seconds.  Neurological:     Mental Status: She is alert. Mental status is at baseline.     Comments: Tremors present in bilateral upper extremities.  Psychiatric:        Mood and Affect:  Mood normal.        Behavior: Behavior normal.     ED Results / Procedures / Treatments   Labs (all labs ordered are listed, but only abnormal results are displayed) Labs Reviewed  CBC WITH DIFFERENTIAL/PLATELET - Abnormal; Notable for the following components:      Result Value   Hemoglobin 11.2 (*)    MCH 25.7 (*)    MCHC 29.8 (*)    RDW 18.8 (*)    Platelets 67 (*)    All other components within normal limits  COMPREHENSIVE METABOLIC PANEL - Abnormal; Notable for the following components:   Potassium 2.8 (*)    CO2 17 (*)    Glucose, Bld 114 (*)    BUN <5 (*)    Calcium 8.3 (*)    Total Protein 10.3 (*)    AST 133 (*)    Alkaline Phosphatase 170 (*)    Total Bilirubin 2.0 (*)    Anion gap 18 (*)    All other components within normal limits  ETHANOL - Abnormal; Notable for the following  components:   Alcohol, Ethyl (B) 383 (*)    All other components within normal limits  I-STAT BETA HCG BLOOD, ED ([facility], [facility], [facility] ONLY)    EKG EKG Interpretation  Date/Time:  Wednesday February 14 2022 13:08:34 EDT Ventricular Rate:  109 PR Interval:  134 QRS Duration: 109 QT Interval:  369 QTC Calculation: 497 R Axis:   88 Text Interpretation: Sinus tachycardia Borderline T wave abnormalities Borderline prolonged QT interval No significant change since last tracing Confirmed by Deno Etienne [phone]) on 02/14/2022 1:28:36 PM  Radiology No results found.  Procedures Procedures  {Document cardiac monitor, telemetry assessment procedure when appropriate:1}  Medications Ordered in ED Medications - No data to display  ED Course/ Medical Decision Making/ A&P                           Medical Decision Making Risk Prescription drug management.   ***  {Document critical care time when appropriate:1} {Document review of labs and clinical decision tools ie heart score, Chads2Vasc2 etc:1}  {Document your independent review of radiology images, and any outside records:1} {Document your discussion with family members, caretakers, and with consultants:1} {Document social determinants of health affecting pt's care:1} {Document your decision making why or why not admission, treatments were needed:1} Final Clinical Impression(s) / ED Diagnoses Final diagnoses:  None    Rx / DC Orders ED Discharge Orders     None

## 2022-02-14 NOTE — Discharge Instructions (Addendum)
Please continue taking your lactulose, potassium and hydrating.  Please follow-up with your gastroenterologist.  I also strongly recommend detoxifying the a rehab center.  I have given you a list of resources in this area.   Please stop drinking alcohol, please refrain from taking Tylenol for the time being.

## 2022-02-14 NOTE — ED Provider Notes (Signed)
[place] EMERGENCY DEPARTMENT Provider Note   CSN: [id number] Arrival date & time: 02/14/22  1157     History  Chief Complaint  Patient presents with   Alcohol Problem    Joann [name] is a 41 y.o. female.   Alcohol Problem  Patient is a 41 year old female presented emergency room today because "I am ready to detox "she states that today she realized that she needed to stop drinking and came to the emergency room.  She came in with her mother.  Her mother informs me that she has continued to drink.  Patient endorses this as well and has very upfront about her alcohol use.  She drinks approximately a gallon of vodka each day.  She drank 1/2 gallon of vodka today  She denies any confusion.  States that she is taking her lactulose as prescribed.  No recreational drug use.      Home Medications Prior to Admission medications   Medication Sig Start Date End Date Taking? Authorizing Provider  folic acid (FOLVITE) 1 MG tablet Take 1 tablet (1 mg total) by mouth daily. 12/22/21   France Ravens, MD  Multiple Vitamin (MULTIVITAMIN WITH MINERALS) TABS tablet Take 1 tablet by mouth daily. 01/15/20   Angiulli, Lavon Paganini, PA-C  pantoprazole (PROTONIX) 40 MG tablet Take 1 tablet (40 mg total) by mouth 2 (two) times daily. 12/22/21   Erskine Emery, MD  potassium chloride SA (KLOR-CON M) 20 MEQ tablet Take 2 tablets (40 mEq total) by mouth daily. 12/23/21 01/22/22  France Ravens, MD  rifaximin (XIFAXAN) 550 MG TABS tablet Take 1 tablet (550 mg total) by mouth 2 (two) times daily. 12/22/21   Erskine Emery, MD  traZODone (DESYREL) 150 MG tablet Take 150 mg by mouth at bedtime. 01/11/22   [provider]  zinc sulfate 220 (50 Zn) MG capsule Take 1 capsule (220 mg total) by mouth daily. 01/15/20   Angiulli, Lavon Paganini, PA-C      Allergies    Latex    Review of Systems   Review of Systems  Physical Exam Updated Vital Signs BP 113/73 (BP Location: Left Arm)   Pulse (!) 102   Temp  97.6 F (36.4 C) (Oral)   Resp 18   SpO2 99%  Physical Exam Vitals and nursing note reviewed.  Constitutional:      General: She is not in acute distress.    Comments: Chronically ill-appearing 41 year old female.  Pleasant, able to answer questions properly follow commands.  HENT:     Head: Normocephalic and atraumatic.     Nose: Nose normal.     Mouth/Throat:     Mouth: Mucous membranes are dry.  Eyes:     General: No scleral icterus. Cardiovascular:     Rate and Rhythm: Regular rhythm. Tachycardia present.     Pulses: Normal pulses.     Heart sounds: Normal heart sounds.     Comments: HR 100-105 Pulmonary:     Effort: Pulmonary effort is normal. No respiratory distress.     Breath sounds: No wheezing.  Abdominal:     Palpations: Abdomen is soft.     Tenderness: There is no abdominal tenderness.  Musculoskeletal:     Cervical back: Normal range of motion.     Right lower leg: No edema.     Left lower leg: No edema.  Skin:    General: Skin is warm and dry.     Capillary Refill: Capillary refill takes less than 2 seconds.  Neurological:     Mental Status: She is alert. Mental status is at baseline.     Comments: Alert and oriented x3.  Conversant and a good historian.  Smile symmetric, moves all 4 extremities, follows commands well.  Sensation intact in all 4 extremities. No asterixis  Psychiatric:        Mood and Affect: Mood normal.        Behavior: Behavior normal.    ED Results / Procedures / Treatments   Labs (all labs ordered are listed, but only abnormal results are displayed) Labs Reviewed  CBC WITH DIFFERENTIAL/PLATELET - Abnormal; Notable for the following components:      Result Value   Hemoglobin 11.2 (*)    MCH 25.7 (*)    MCHC 29.8 (*)    RDW 18.8 (*)    Platelets 67 (*)    All other components within normal limits  COMPREHENSIVE METABOLIC PANEL - Abnormal; Notable for the following components:   Potassium 2.8 (*)    CO2 17 (*)    Glucose, Bld  114 (*)    BUN <5 (*)    Calcium 8.3 (*)    Total Protein 10.3 (*)    AST 133 (*)    Alkaline Phosphatase 170 (*)    Total Bilirubin 2.0 (*)    Anion gap 18 (*)    All other components within normal limits  ETHANOL - Abnormal; Notable for the following components:   Alcohol, Ethyl (B) 383 (*)    All other components within normal limits  I-STAT BETA HCG BLOOD, ED ([facility], [facility], [facility] ONLY)    EKG EKG Interpretation  Date/Time:  Wednesday February 14 2022 13:08:34 EDT Ventricular Rate:  109 PR Interval:  134 QRS Duration: 109 QT Interval:  369 QTC Calculation: 497 R Axis:   88 Text Interpretation: Sinus tachycardia Borderline T wave abnormalities Borderline prolonged QT interval No significant change since last tracing Confirmed by Deno Etienne [phone]) on 02/14/2022 1:28:36 PM  Radiology No results found.  Procedures Procedures    Medications Ordered in ED Medications  potassium chloride SA (KLOR-CON M) CR tablet 40 mEq (has no administration in time range)    ED Course/ Medical Decision Making/ A&P                           Medical Decision Making Risk Prescription drug management.   This patient presents to the ED for concern of alcohol use, this involves a number of treatment options, and is a complaint that carries with it a moderate to high risk of complications and morbidity.    Co morbidities: Discussed in HPI   Brief History:  Patient is a 41 year old female presented emergency room today because "I am ready to detox "she states that today she realized that she needed to stop drinking and came to the emergency room.  She came in with her mother.  Her mother informs me that she has continued to drink.  Patient endorses this as well and has very upfront about her alcohol use.  She drinks approximately a gallon of vodka each day.  She drank 1/2 gallon of vodka today  She denies any confusion.  States that she is taking her lactulose as prescribed.  No recreational drug  use.   PE no abd TTP   EMR reviewed including pt PMHx, past surgical history and past visits to ER.   See HPI for more details   Lab Tests:  I ordered and independently interpreted labs. Labs notable for hypokalemia this is a chronic issue.  She has potassium supplements to take at home.  CBC is unremarkable.  CMP is notable for transaminitis and elevated alk phos consistent with her known liver disease.  I-STAT hCG negative for pregnancy.   Imaging Studies:  No imaging studies ordered for this patient    Cardiac Monitoring:  NA EKG non-ischemic no significant changes from prior   Medicines ordered:  I ordered medication including potassium p.o. for hypokalemia she will continue to take her supplements at home Reevaluation of the patient after these medicines showed that the patient no change I have reviewed the patients home medicines and have made adjustments as needed   Critical Interventions:     Consults/Attending Physician   I discussed this case with my attending physician who cosigned this note including patient's presenting symptoms, physical exam, and planned diagnostics and interventions. Attending physician stated agreement with plan or made changes to plan which were implemented.   Reevaluation:  After the interventions noted above I re-evaluated patient and found that they have :stayed the same   Social Determinants of Health:      Problem List / ED Course:  Continued alcohol use.  I provided patient with resources.  She does not appear to be withdrawing she is currently intoxicated with an alcohol of 383 however she drinks heavily on a daily basis.  She is speaking clearly she states she does not feel confused.  She is with her mother.  Will discharge in mother's care and recommend outpatient detoxification.   Dispostion:  After consideration of the diagnostic results and the patients response to treatment, I feel that the patent would  benefit from outpatient follow-up and detox facility.  Provided resources.  Neither was trying or severely intoxicated this time.  She is tolerating p.o. and ambulatory.  Was monitored in the ER for over 2 hours and discharged in mother's care.   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)    Rx / DC Orders ED Discharge Orders     None         Tedd Sias, [place] 02/17/22 [employer], [place], DO 02/19/22 1455

## 2022-02-14 NOTE — ED Triage Notes (Signed)
Pt arrives intoxicated, having driven herself here. States needs to detox from alcohol, as she usually drinks one gallon of vodka per day. Last drink just PTA.

## 2022-02-15 ENCOUNTER — Emergency Department (HOSPITAL_COMMUNITY): Payer: Medicaid Other

## 2022-02-15 ENCOUNTER — Encounter (HOSPITAL_COMMUNITY): Payer: Self-pay

## 2022-02-15 ENCOUNTER — Inpatient Hospital Stay (HOSPITAL_COMMUNITY)
Admission: EM | Admit: 2022-02-15 | Discharge: 2022-02-25 | DRG: 896 | Disposition: A | Discharge status: Home / Self Care | Payer: Medicaid Other | Attending: Internal Medicine | Admitting: Internal Medicine

## 2022-02-15 ENCOUNTER — Other Ambulatory Visit: Payer: Self-pay

## 2022-02-15 DIAGNOSIS — D649 Anemia, unspecified: Secondary | ICD-10-CM | POA: Diagnosis not present

## 2022-02-15 DIAGNOSIS — E43 Unspecified severe protein-calorie malnutrition: Secondary | ICD-10-CM | POA: Diagnosis not present

## 2022-02-15 DIAGNOSIS — Z811 Family history of alcohol abuse and dependence: Secondary | ICD-10-CM | POA: Diagnosis not present

## 2022-02-15 DIAGNOSIS — Z833 Family history of diabetes mellitus: Secondary | ICD-10-CM | POA: Diagnosis not present

## 2022-02-15 DIAGNOSIS — A419 Sepsis, unspecified organism: Secondary | ICD-10-CM | POA: Diagnosis not present

## 2022-02-15 DIAGNOSIS — F419 Anxiety disorder, unspecified: Secondary | ICD-10-CM

## 2022-02-15 DIAGNOSIS — E876 Hypokalemia: Secondary | ICD-10-CM | POA: Diagnosis present

## 2022-02-15 DIAGNOSIS — D638 Anemia in other chronic diseases classified elsewhere: Secondary | ICD-10-CM | POA: Diagnosis present

## 2022-02-15 DIAGNOSIS — G928 Other toxic encephalopathy: Secondary | ICD-10-CM | POA: Diagnosis present

## 2022-02-15 DIAGNOSIS — F10931 Alcohol use, unspecified with withdrawal delirium: Secondary | ICD-10-CM | POA: Diagnosis present

## 2022-02-15 DIAGNOSIS — E8729 Other acidosis: Secondary | ICD-10-CM | POA: Diagnosis present

## 2022-02-15 DIAGNOSIS — F1024 Alcohol dependence with alcohol-induced mood disorder: Secondary | ICD-10-CM | POA: Diagnosis present

## 2022-02-15 DIAGNOSIS — Z8249 Family history of ischemic heart disease and other diseases of the circulatory system: Secondary | ICD-10-CM | POA: Diagnosis not present

## 2022-02-15 DIAGNOSIS — D6959 Other secondary thrombocytopenia: Secondary | ICD-10-CM | POA: Diagnosis present

## 2022-02-15 DIAGNOSIS — E878 Other disorders of electrolyte and fluid balance, not elsewhere classified: Secondary | ICD-10-CM

## 2022-02-15 DIAGNOSIS — Z781 Physical restraint status: Secondary | ICD-10-CM | POA: Diagnosis not present

## 2022-02-15 DIAGNOSIS — D72819 Decreased white blood cell count, unspecified: Secondary | ICD-10-CM | POA: Diagnosis not present

## 2022-02-15 DIAGNOSIS — K7030 Alcoholic cirrhosis of liver without ascites: Secondary | ICD-10-CM | POA: Diagnosis present

## 2022-02-15 DIAGNOSIS — T7411XA Adult physical abuse, confirmed, initial encounter: Secondary | ICD-10-CM | POA: Diagnosis not present

## 2022-02-15 DIAGNOSIS — F1014 Alcohol abuse with alcohol-induced mood disorder: Secondary | ICD-10-CM | POA: Diagnosis not present

## 2022-02-15 DIAGNOSIS — K766 Portal hypertension: Secondary | ICD-10-CM | POA: Diagnosis present

## 2022-02-15 DIAGNOSIS — T7491XA Unspecified adult maltreatment, confirmed, initial encounter: Secondary | ICD-10-CM | POA: Diagnosis not present

## 2022-02-15 DIAGNOSIS — E870 Hyperosmolality and hypernatremia: Secondary | ICD-10-CM | POA: Diagnosis not present

## 2022-02-15 DIAGNOSIS — F10220 Alcohol dependence with intoxication, uncomplicated: Secondary | ICD-10-CM | POA: Diagnosis present

## 2022-02-15 DIAGNOSIS — G312 Degeneration of nervous system due to alcohol: Secondary | ICD-10-CM | POA: Diagnosis not present

## 2022-02-15 DIAGNOSIS — I959 Hypotension, unspecified: Secondary | ICD-10-CM

## 2022-02-15 DIAGNOSIS — R17 Unspecified jaundice: Secondary | ICD-10-CM | POA: Diagnosis present

## 2022-02-15 DIAGNOSIS — K7682 Hepatic encephalopathy: Secondary | ICD-10-CM | POA: Diagnosis not present

## 2022-02-15 DIAGNOSIS — D696 Thrombocytopenia, unspecified: Secondary | ICD-10-CM | POA: Diagnosis not present

## 2022-02-15 DIAGNOSIS — I10 Essential (primary) hypertension: Secondary | ICD-10-CM | POA: Diagnosis present

## 2022-02-15 DIAGNOSIS — F10230 Alcohol dependence with withdrawal, uncomplicated: Secondary | ICD-10-CM | POA: Diagnosis not present

## 2022-02-15 DIAGNOSIS — D689 Coagulation defect, unspecified: Secondary | ICD-10-CM | POA: Diagnosis not present

## 2022-02-15 DIAGNOSIS — F10231 Alcohol dependence with withdrawal delirium: Principal | ICD-10-CM | POA: Diagnosis present

## 2022-02-15 DIAGNOSIS — G9340 Encephalopathy, unspecified: Secondary | ICD-10-CM | POA: Diagnosis present

## 2022-02-15 DIAGNOSIS — E875 Hyperkalemia: Secondary | ICD-10-CM | POA: Diagnosis not present

## 2022-02-15 HISTORY — DX: Alcohol use, unspecified with withdrawal delirium: F10.931

## 2022-02-15 HISTORY — DX: Encephalopathy, unspecified: G93.40

## 2022-02-15 HISTORY — DX: Alcohol induced acute pancreatitis without necrosis or infection: K85.20

## 2022-02-15 LAB — CBC WITH DIFFERENTIAL/PLATELET
Abs Immature Granulocytes: 0.04 10*3/uL (ref 0.00–0.07)
Basophils Absolute: 0 10*3/uL (ref 0.0–0.1)
Basophils Relative: 0 %
Eosinophils Absolute: 0 10*3/uL (ref 0.0–0.5)
Eosinophils Relative: 0 %
HCT: 32.8 % — ABNORMAL LOW (ref 36.0–46.0)
Hemoglobin: 9.9 g/dL — ABNORMAL LOW (ref 12.0–15.0)
Immature Granulocytes: 1 %
Lymphocytes Relative: 9 %
Lymphs Abs: 0.8 10*3/uL (ref 0.7–4.0)
MCH: 25.8 pg — ABNORMAL LOW (ref 26.0–34.0)
MCHC: 30.2 g/dL (ref 30.0–36.0)
MCV: 85.4 fL (ref 80.0–100.0)
Monocytes Absolute: 0.7 10*3/uL (ref 0.1–1.0)
Monocytes Relative: 8 %
Neutro Abs: 6.7 10*3/uL (ref 1.7–7.7)
Neutrophils Relative %: 82 %
Platelets: 84 10*3/uL — ABNORMAL LOW (ref 150–400)
RBC: 3.84 MIL/uL — ABNORMAL LOW (ref 3.87–5.11)
RDW: 18.8 % — ABNORMAL HIGH (ref 11.5–15.5)
WBC: 8.2 10*3/uL (ref 4.0–10.5)
nRBC: 0 % (ref 0.0–0.2)

## 2022-02-15 LAB — COMPREHENSIVE METABOLIC PANEL
ALT: <5 U/L (ref 0–44)
AST: <5 U/L — ABNORMAL LOW (ref 15–41)
Albumin: 4.1 g/dL (ref 3.5–5.0)
Alkaline Phosphatase: 151 U/L — ABNORMAL HIGH (ref 38–126)
Anion gap: 28 — ABNORMAL HIGH (ref 5–15)
BUN: 10 mg/dL (ref 6–20)
CO2: 14 mmol/L — ABNORMAL LOW (ref 22–32)
Calcium: 8.6 mg/dL — ABNORMAL LOW (ref 8.9–10.3)
Chloride: 100 mmol/L (ref 98–111)
Creatinine, Ser: 1.1 mg/dL — ABNORMAL HIGH (ref 0.44–1.00)
GFR, Estimated: >60 mL/min (ref >60)
Glucose, Bld: 129 mg/dL — ABNORMAL HIGH (ref 70–99)
Potassium: 2.8 mmol/L — ABNORMAL LOW (ref 3.5–5.1)
Sodium: 142 mmol/L (ref 135–145)
Total Bilirubin: 5 mg/dL — ABNORMAL HIGH (ref 0.3–1.2)
Total Protein: 10.1 g/dL — ABNORMAL HIGH (ref 6.5–8.1)

## 2022-02-15 LAB — I-STAT BETA HCG BLOOD, ED (MC, WL, AP ONLY): I-stat hCG, quantitative: <5 m[IU]/mL (ref <5)

## 2022-02-15 LAB — ETHANOL: Alcohol, Ethyl (B): <10 mg/dL (ref <10)

## 2022-02-15 MED ORDER — POTASSIUM CHLORIDE 10 MEQ/100ML IV SOLN
10.0000 meq | INTRAVENOUS | Status: AC
  Administered 2022-02-15 – 2022-02-16 (×4): 10 meq via INTRAVENOUS
  Filled 2022-02-15 (×4): qty 100

## 2022-02-15 MED ORDER — SODIUM CHLORIDE 0.9 % IV BOLUS
1000.0000 mL | Freq: Once | INTRAVENOUS | Status: AC
  Administered 2022-02-15: 1000 mL via INTRAVENOUS

## 2022-02-15 MED ORDER — DEXTROSE IN LACTATED RINGERS 5 % IV SOLN: INTRAVENOUS | Status: DC

## 2022-02-15 MED ORDER — LORAZEPAM 2 MG/ML IJ SOLN
2.0000 mg | Freq: Once | INTRAMUSCULAR | Status: AC
Start: 2022-02-15 — End: 2022-02-15
  Administered 2022-02-15: 2 mg via INTRAVENOUS
  Filled 2022-02-15: qty 1

## 2022-02-15 MED ORDER — LORAZEPAM 1 MG PO TABS: 0.0000 mg | ORAL_TABLET | Freq: Two times a day (BID) | ORAL | Status: AC

## 2022-02-15 MED ORDER — THIAMINE HCL 100 MG/ML IJ SOLN
100.0000 mg | Freq: Every day | INTRAMUSCULAR | Status: DC
  Administered 2022-02-15 – 2022-02-16 (×2): 100 mg via INTRAVENOUS
  Filled 2022-02-15 (×2): qty 2

## 2022-02-15 MED ORDER — HALOPERIDOL LACTATE 5 MG/ML IJ SOLN
10.0000 mg | Freq: Once | INTRAMUSCULAR | Status: AC
  Administered 2022-02-15: 10 mg via INTRAMUSCULAR
  Filled 2022-02-15: qty 2

## 2022-02-15 MED ORDER — LORAZEPAM 2 MG/ML IJ SOLN
2.0000 mg | INTRAMUSCULAR | Status: AC
  Administered 2022-02-15: 2 mg via INTRAVENOUS

## 2022-02-15 MED ORDER — LORAZEPAM 1 MG PO TABS: 0.0000 mg | ORAL_TABLET | Freq: Four times a day (QID) | ORAL | Status: AC

## 2022-02-15 MED ORDER — POTASSIUM CHLORIDE IN NACL 20-0.9 MEQ/L-% IV SOLN
Freq: Once | INTRAVENOUS | Status: AC
  Filled 2022-02-15: qty 1000

## 2022-02-15 MED ORDER — THIAMINE HCL 100 MG PO TABS: 100.0000 mg | ORAL_TABLET | Freq: Every day | ORAL | Status: DC

## 2022-02-15 MED ORDER — LORAZEPAM 2 MG/ML IJ SOLN
0.0000 mg | Freq: Four times a day (QID) | INTRAMUSCULAR | Status: AC
  Administered 2022-02-15: 2 mg via INTRAVENOUS
  Administered 2022-02-16: 1 mg via INTRAVENOUS
  Administered 2022-02-16: 2 mg via INTRAVENOUS
  Administered 2022-02-16: 1 mg via INTRAVENOUS
  Administered 2022-02-16 (×2): 2 mg via INTRAVENOUS
  Administered 2022-02-17: 4 mg via INTRAVENOUS
  Administered 2022-02-17: 3 mg via INTRAVENOUS
  Filled 2022-02-15 (×4): qty 1
  Filled 2022-02-15: qty 2
  Filled 2022-02-15 (×3): qty 1

## 2022-02-15 MED ORDER — SODIUM CHLORIDE 0.9 % IV BOLUS: 1000.0000 mL | Freq: Once | INTRAVENOUS | Status: DC

## 2022-02-15 MED ORDER — LORAZEPAM 2 MG/ML IJ SOLN
0.0000 mg | Freq: Two times a day (BID) | INTRAMUSCULAR | Status: AC
  Administered 2022-02-18: 2 mg via INTRAVENOUS
  Administered 2022-02-18 (×2): 4 mg via INTRAVENOUS
  Administered 2022-02-19: 3 mg via INTRAVENOUS
  Filled 2022-02-15: qty 2
  Filled 2022-02-15: qty 1
  Filled 2022-02-15 (×2): qty 2

## 2022-02-15 MED ORDER — POTASSIUM CHLORIDE IN NACL 20-0.9 MEQ/L-% IV SOLN
Freq: Once | INTRAVENOUS | Status: DC
  Filled 2022-02-15: qty 1000

## 2022-02-15 NOTE — Assessment & Plan Note (Signed)
Chronic. 

## 2022-02-15 NOTE — Assessment & Plan Note (Signed)
Worse since yesterday.  Could be due to hypovolemia.  Continue with IV fluids.  Repeat CMP in the morning.

## 2022-02-15 NOTE — ED Notes (Signed)
Upon arrival to shift pt was in non violent restraints and was switched to non violent restraint orders.

## 2022-02-15 NOTE — ED Provider Notes (Signed)
[place] DEPT Provider Note   CSN: [id number] Arrival date & time: 02/15/22  1446     History  No chief complaint on file.   Joann [name] is a 41 y.o. female.  HPI Patient presents via EMS from home.  She cannot provide any details of her history, level 5 caveat secondary to mental status change.  Some history is obtained per chart review.  Patient was seen and evaluated 2 days ago after presenting for alcohol intoxication.  She has a history of alcohol abuse, seemingly has been drinking consistently, became altered today according to EMS.  No reported fall, trauma.    Home Medications Prior to Admission medications   Medication Sig Start Date End Date Taking? Authorizing Provider  folic acid (FOLVITE) 1 MG tablet Take 1 tablet (1 mg total) by mouth daily. 12/22/21  Yes France Ravens, MD  lactulose [facility]) 10 GM/15ML solution Take 20 g by mouth daily as needed for mild constipation.   Yes [provider]  Multiple Vitamin (MULTIVITAMIN WITH MINERALS) TABS tablet Take 1 tablet by mouth daily. 01/15/20  Yes Angiulli, Lavon Paganini, PA-C  pantoprazole (PROTONIX) 40 MG tablet Take 1 tablet (40 mg total) by mouth 2 (two) times daily. 12/22/21  Yes Maxwell, Allee, MD  potassium chloride SA (KLOR-CON M) 20 MEQ tablet Take 2 tablets (40 mEq total) by mouth daily. 12/23/21 02/15/22 Yes France Ravens, MD  rifaximin (XIFAXAN) 550 MG TABS tablet Take 1 tablet (550 mg total) by mouth 2 (two) times daily. 12/22/21  Yes Erskine Emery, MD  traZODone (DESYREL) 150 MG tablet Take 150 mg by mouth at bedtime. 01/11/22  Yes [provider]  vitamin B-12 (CYANOCOBALAMIN) 100 MCG tablet Take 100 mcg by mouth daily.   Yes [provider]  zinc sulfate 220 (50 Zn) MG capsule Take 1 capsule (220 mg total) by mouth daily. 01/15/20  Yes Angiulli, Lavon Paganini, PA-C      Allergies    Latex    Review of Systems   Review of Systems  Unable to perform ROS: Mental status  change    Physical Exam Updated Vital Signs BP 135/82   Pulse (!) 127   Temp 98.3 F (36.8 C) (Axillary)   Resp (!) 21   Ht 5' 3"  (1.6 m)   Wt 73 kg   SpO2 100%   BMI 28.51 kg/m  Physical Exam Vitals and nursing note reviewed.  Constitutional:      Appearance: She is well-developed.     Comments: Adult female sitting up awake and alert, but not following commands, not interacting, mumbling occasional words in response to questions.  HENT:     Head: Normocephalic and atraumatic.  Eyes:     Conjunctiva/sclera: Conjunctivae normal.  Cardiovascular:     Rate and Rhythm: Regular rhythm. Tachycardia present.  Pulmonary:     Effort: Pulmonary effort is normal. No respiratory distress.     Breath sounds: Normal breath sounds. No stridor.  Abdominal:     General: There is no distension.  Skin:    General: Skin is warm and dry.  Neurological:     Mental Status: She is alert.     Comments: Face is symmetric, patient does not follow commands reliably, speech is inconsistent.  Psychiatric:        Cognition and Memory: Cognition is impaired. Memory is impaired.     ED Results / Procedures / Treatments   Labs (all labs ordered are listed, but only abnormal  results are displayed) Labs Reviewed  COMPREHENSIVE METABOLIC PANEL - Abnormal; Notable for the following components:      Result Value   Potassium 2.8 (*)    CO2 14 (*)    Glucose, Bld 129 (*)    Creatinine, Ser 1.10 (*)    Calcium 8.6 (*)    Total Protein 10.1 (*)    AST <5 (*)    Alkaline Phosphatase 151 (*)    Total Bilirubin 5.0 (*)    Anion gap 28 (*)    All other components within normal limits  CBC WITH DIFFERENTIAL/PLATELET - Abnormal; Notable for the following components:   RBC 3.84 (*)    Hemoglobin 9.9 (*)    HCT 32.8 (*)    MCH 25.8 (*)    RDW 18.8 (*)    Platelets 84 (*)    All other components within normal limits  ETHANOL  URINALYSIS, ROUTINE W REFLEX MICROSCOPIC  RAPID URINE DRUG SCREEN, HOSP  PERFORMED  I-STAT BETA HCG BLOOD, ED ([facility], [facility], [facility] ONLY)    EKG None  Radiology CT Head Wo Contrast  Result Date: 02/15/2022 CLINICAL DATA:  Mental status change, unknown cause, encephalopathy. EXAM: CT HEAD WITHOUT CONTRAST TECHNIQUE: Contiguous axial images were obtained from the base of the skull through the vertex without intravenous contrast. RADIATION DOSE REDUCTION: This exam was performed according to the departmental dose-optimization program which includes automated exposure control, adjustment of the mA and/or kV according to patient size and/or use of iterative reconstruction technique. COMPARISON:  12/19/2021. FINDINGS: Brain: No acute intracranial hemorrhage, midline shift or mass effect. No extra-axial fluid collection. Mild atrophy is noted. No hydrocephalus. Vascular: No hyperdense vessel or unexpected calcification. Skull: Normal. Negative for fracture or focal lesion. Sinuses/Orbits: No acute finding. Other: None. IMPRESSION: Stable head CT with no acute intracranial abnormality. Electronically Signed   By: Brett Fairy M.D.   On: 02/15/2022 21:03    Procedures Procedures    Medications Ordered in ED Medications  LORazepam (ATIVAN) injection 0-4 mg (0 mg Intravenous Not Given 02/15/22 2146)    Or  LORazepam (ATIVAN) tablet 0-4 mg ( Oral See Alternative 02/15/22 2146)  LORazepam (ATIVAN) injection 0-4 mg (has no administration in time range)    Or  LORazepam (ATIVAN) tablet 0-4 mg (has no administration in time range)  thiamine tablet 100 mg ( Oral See Alternative 02/15/22 1540)    Or  thiamine (B-1) injection 100 mg (100 mg Intravenous Given 02/15/22 1540)  0.9 % NaCl with KCl 20 mEq/ L  infusion (has no administration in time range)  LORazepam (ATIVAN) injection 2 mg (2 mg Intravenous Given 02/15/22 1539)  haloperidol lactate (HALDOL) injection 10 mg (10 mg Intramuscular Given 02/15/22 1621)  0.9 % NaCl with KCl 20 mEq/ L  infusion (0 mL/hr Intravenous Stopped 02/15/22 2037)   sodium chloride 0.9 % bolus 1,000 mL (1,000 mLs Intravenous New Bag/Given 02/15/22 2000)    ED Course/ Medical Decision Making/ A&P This patient with a Hx of alcohol abuse presents to the ED for concern of altered mental status, intoxication, this involves an extensive number of treatment options, and is a complaint that carries with it a high risk of complications and morbidity.    The differential diagnosis includes acute encephalopathy, alcohol intoxication, fall with head trauma   Social Determinants of Health:  Alcohol abuse  Additional history obtained:  Additional history and/or information obtained from EMS, chart review, notable for details in HPI   After the initial evaluation, orders,  including: Labs, initiation of CIWA protocol, Ativan x2 were initiated.   Patient placed on Cardiac and Pulse-Oximetry Monitors. The patient was maintained on a cardiac monitor.  The cardiac monitored showed an rhythm of 130 sinus tach abnormal The patient was also maintained on pulse oximetry. The readings were typically 99% room air normal   On repeat evaluation of the patient stayed the same Patient required restraints with her Ativan. Lab Tests:  I personally interpreted labs.  The pertinent results include: Hypokalemia, high anion gap acidosis consistent with alcohol use  Imaging Studies ordered:  I independently visualized and interpreted imaging which showed unremarkable head CT I agree with the radiologist interpretation   Dispostion / Final MDM:  After consideration of the diagnostic results and the patient's response to treatment, patient will require admission to the stepdown unit given concern for alcohol withdrawal with complication.  Patient's potassium addressed with IV resuscitation in the emergency department.  Patient's initial agitation, noncompliance improved after Ativan, Haldol and initiation of CIWA protocol.  Final Clinical Impression(s) / ED Diagnoses Final  diagnoses:  Alcohol withdrawal delirium ([place])  Hypokalemia  CRITICAL CARE Performed by: Carmin Muskrat Total critical care time: 45 minutes Critical care time was exclusive of separately billable procedures and treating other patients. Critical care was necessary to treat or prevent imminent or life-threatening deterioration. Critical care was time spent personally by me on the following activities: development of treatment plan with patient and/or surrogate as well as nursing, discussions with consultants, evaluation of patient's response to treatment, examination of patient, obtaining history from patient or surrogate, ordering and performing treatments and interventions, ordering and review of laboratory studies, ordering and review of radiographic studies, pulse oximetry and re-evaluation of patient's condition.    Carmin Muskrat, MD 02/15/22 2158

## 2022-02-15 NOTE — Assessment & Plan Note (Signed)
Long history of esophageal varices.  Last EGD appears to be in April 2021.  Continue with IV Protonix.  Does not appear to be bleeding.

## 2022-02-15 NOTE — ED Triage Notes (Signed)
Pt BIB EMS from home. Husband states patient went on binge drinking for a week and recently stopped on Tuesday. Patient began vomiting on Wednesday and became altered Thursday morning. Patient was recently seen at [facility] for ETOH intoxication.

## 2022-02-15 NOTE — Assessment & Plan Note (Addendum)
Admit to inpatient bed to telemetry stepdown unit.  Continue CIWA protocol.  Low threshold to start Precedex for additional agitation control.  Continue with four-point restraints for now.  Keep NPO.  Continue IV fluids with D5 LR.  Repeat BMP in the morning.  Patient appears to be protecting her airway at this point.

## 2022-02-15 NOTE — H&P (Signed)
History and Physical    Joann [name] OVF:[id number] DOB: [birth year] DOA: 02/15/2022  DOS: the patient was seen and examined on 02/15/2022  PCP: Pcp, No   Patient coming from: Home  I have personally briefly reviewed patient's old medical records in [place]  Chief complaint is alcohol withdrawal History present illness: 41 year old African-American female history of alcohol abuse, alcoholic cirrhosis who was seen yesterday in the [facility], ER.  Apparently she was discharged.  She presented to [facility], ER.  Reportedly prior to triage she was agitated and altered.  She has been having vomiting since yesterday.  Patient was agitated the ER requiring four-point restraints and IV Haldol 10 mg.  She was started on CIWA protocol with and given 2 mg of IV Ativan every 4 hours x2.  Noted be hypokalemic with a serum potassium of 2.8.  IV potassium replacement started.  Patient's parents and husband are no longer in the ER.  [facility] hospitalist contacted for admission.  Patient unable to give any history or review of systems due to sedation with IV Ativan.   ED Course: Combative in the ER.  Required four-point restraints.  Given IV Ativan.  Review of Systems:  Review of Systems  Unable to perform ROS: Other  Sedated with IV ativan  Past Medical History:  Diagnosis Date   [redacted] weeks gestation of pregnancy 03/22/2020   Alcohol abuse    Alcohol-induced acute pancreatitis    Anxiety    Elevated LFTs 10/24/2018   Hepatic encephalopathy ([place]) 12/24/2019   Hypertension    Liver disease     Past Surgical History:  Procedure Laterality Date   ECTOPIC PREGNANCY SURGERY     ESOPHAGOGASTRODUODENOSCOPY (EGD) WITH PROPOFOL N/A 12/27/2019   Procedure: ESOPHAGOGASTRODUODENOSCOPY (EGD) WITH PROPOFOL;  Surgeon: Carol Ada, MD;  Location: [facility];  Service: Endoscopy;  Laterality: N/A;     reports that she has never smoked. She has never used smokeless tobacco. She reports current  alcohol use. She reports that she does not use drugs.  Allergies  Allergen Reactions   Latex Hives    Patient stated that she is allergic to latex    Family History  Problem Relation Age of Onset   Healthy Mother    Alcohol abuse Mother        quit in her 40s   Alcohol abuse Father    Diabetes Father    Healthy Brother    Hypertension Maternal Grandmother    Hypertension Maternal Grandfather    Healthy Brother    Healthy Brother     Prior to Admission medications   Medication Sig Start Date End Date Taking? Authorizing Provider  folic acid (FOLVITE) 1 MG tablet Take 1 tablet (1 mg total) by mouth daily. 12/22/21  Yes France Ravens, MD  lactulose [facility]) 10 GM/15ML solution Take 20 g by mouth daily as needed for mild constipation.   Yes [provider]  Multiple Vitamin (MULTIVITAMIN WITH MINERALS) TABS tablet Take 1 tablet by mouth daily. 01/15/20  Yes Angiulli, Lavon Paganini, PA-C  pantoprazole (PROTONIX) 40 MG tablet Take 1 tablet (40 mg total) by mouth 2 (two) times daily. 12/22/21  Yes Maxwell, Allee, MD  potassium chloride SA (KLOR-CON M) 20 MEQ tablet Take 2 tablets (40 mEq total) by mouth daily. 12/23/21 02/15/22 Yes France Ravens, MD  rifaximin (XIFAXAN) 550 MG TABS tablet Take 1 tablet (550 mg total) by mouth 2 (two) times daily. 12/22/21  Yes Erskine Emery, MD  traZODone (DESYREL) 150  MG tablet Take 150 mg by mouth at bedtime. 01/11/22  Yes [provider]  vitamin B-12 (CYANOCOBALAMIN) 100 MCG tablet Take 100 mcg by mouth daily.   Yes [provider]  zinc sulfate 220 (50 Zn) MG capsule Take 1 capsule (220 mg total) by mouth daily. 01/15/20  Yes Cathlyn Parsons, PA-C    Physical Exam: Vitals:   02/15/22 2030 02/15/22 2100 02/15/22 2118 02/15/22 2145  BP: 107/63  136/80 135/82  Pulse:  (!) 128 (!) 132 (!) 127  Resp: (!) 24  (!) 24 (!) 21  Temp:      TempSrc:      SpO2:  100% 100% 100%  Weight:      Height:        Physical Exam Vitals and nursing  note reviewed.  Constitutional:      Appearance: She is normal weight.     Comments: Sedated. Unarousable. Protecting her airway  HENT:     Head: Normocephalic and atraumatic.  Eyes:     General: Scleral icterus present.     Pupils: Pupils are equal, round, and reactive to light.  Cardiovascular:     Rate and Rhythm: Regular rhythm. Tachycardia present.  Pulmonary:     Effort: Pulmonary effort is normal. No respiratory distress.  Abdominal:     General: Bowel sounds are normal. There is no distension.  Musculoskeletal:     Right lower leg: No edema.     Left lower leg: No edema.  Skin:    General: Skin is warm and dry.     Capillary Refill: Capillary refill takes less than 2 seconds.  Neurological:     Comments: sedated      Labs on Admission: I have personally reviewed following labs and imaging studies  CBC: Recent Labs  Lab 02/14/22 1215 02/15/22 1705  WBC 4.1 8.2  NEUTROABS 2.5 6.7  HGB 11.2* 9.9*  HCT 37.6 32.8*  MCV 86.2 85.4  PLT 67* 84*   Basic Metabolic Panel: Recent Labs  Lab 02/14/22 1215 02/15/22 1705  NA 143 142  K 2.8* 2.8*  CL 108 100  CO2 17* 14*  GLUCOSE 114* 129*  BUN <5* 10  CREATININE 0.67 1.10*  CALCIUM 8.3* 8.6*   GFR: Estimated Creatinine Clearance: 63.7 mL/min (A) (by C-G formula based on SCr of 1.1 mg/dL (H)). Liver Function Tests: Recent Labs  Lab 02/14/22 1215 02/15/22 1705  AST 133* <5*  ALT 17 <5  ALKPHOS 170* 151*  BILITOT 2.0* 5.0*  PROT 10.3* 10.1*  ALBUMIN 3.8 4.1   No results for input(s): "LIPASE", "AMYLASE" in the last 168 hours. No results for input(s): "AMMONIA" in the last 168 hours. Coagulation Profile: No results for input(s): "INR", "PROTIME" in the last 168 hours. Cardiac Enzymes: No results for input(s): "CKTOTAL", "CKMB", "CKMBINDEX", "TROPONINI", "TROPONINIHS" in the last 168 hours. BNP (last 3 results) No results for input(s): "PROBNP" in the last 8760 hours. HbA1C: No results for input(s):  "HGBA1C" in the last 72 hours. CBG: No results for input(s): "GLUCAP" in the last 168 hours. Lipid Profile: No results for input(s): "CHOL", "HDL", "LDLCALC", "TRIG", "CHOLHDL", "LDLDIRECT" in the last 72 hours. Thyroid Function Tests: No results for input(s): "TSH", "T4TOTAL", "FREET4", "T3FREE", "THYROIDAB" in the last 72 hours. Anemia Panel: No results for input(s): "VITAMINB12", "FOLATE", "FERRITIN", "TIBC", "IRON", "RETICCTPCT" in the last 72 hours. Urine analysis:    Component Value Date/Time   COLORURINE AMBER (A) 12/26/2019 0920   APPEARANCEUR CLOUDY (A) 12/26/2019 0920  LABSPEC 1.019 12/26/2019 0920   PHURINE 5.0 12/26/2019 0920   GLUCOSEU NEGATIVE 12/26/2019 0920   HGBUR NEGATIVE 12/26/2019 0920   BILIRUBINUR MODERATE (A) 12/26/2019 0920   KETONESUR 5 (A) 12/26/2019 0920   PROTEINUR 30 (A) 12/26/2019 0920   NITRITE NEGATIVE 12/26/2019 0920   LEUKOCYTESUR NEGATIVE 12/26/2019 0920    Radiological Exams on Admission: I have personally reviewed images CT Head Wo Contrast  Result Date: 02/15/2022 CLINICAL DATA:  Mental status change, unknown cause, encephalopathy. EXAM: CT HEAD WITHOUT CONTRAST TECHNIQUE: Contiguous axial images were obtained from the base of the skull through the vertex without intravenous contrast. RADIATION DOSE REDUCTION: This exam was performed according to the departmental dose-optimization program which includes automated exposure control, adjustment of the mA and/or kV according to patient size and/or use of iterative reconstruction technique. COMPARISON:  12/19/2021. FINDINGS: Brain: No acute intracranial hemorrhage, midline shift or mass effect. No extra-axial fluid collection. Mild atrophy is noted. No hydrocephalus. Vascular: No hyperdense vessel or unexpected calcification. Skull: Normal. Negative for fracture or focal lesion. Sinuses/Orbits: No acute finding. Other: None. IMPRESSION: Stable head CT with no acute intracranial abnormality. Electronically  Signed   By: Brett Fairy M.D.   On: 02/15/2022 21:03    EKG: My personal interpretation of EKG shows: sinus tachycardia    Assessment/Plan Principal Problem:   Acute, mixed level of activity, alcohol withdrawal delirium (HCC) Active Problems:   Alcoholic ketoacidosis   Thrombocytopenia (HCC)   Hypokalemia   Elevated bilirubin   Alcoholic cirrhosis (HCC)    Assessment and Plan: * Acute, mixed level of activity, alcohol withdrawal delirium ([place]) Admit to inpatient bed to telemetry stepdown unit.  Continue CIWA protocol.  Low threshold to start Precedex for additional agitation control.  Continue with four-point restraints for now.  Keep NPO.  Continue IV fluids with D5 LR.  Repeat BMP in the morning.  Patient appears to be protecting her airway at this point.  Alcoholic cirrhosis (HCC) Long history of esophageal varices.  Last EGD appears to be in April 2021.  Continue with IV Protonix.  Does not appear to be bleeding.  Elevated bilirubin Worse since yesterday.  Could be due to hypovolemia.  Continue with IV fluids.  Repeat CMP in the morning.  Hypokalemia Acute.  Continue with IV potassium replacement.  Keep n.p.o. due to altered mental status.  Thrombocytopenia (HCC) Chronic.  Alcoholic ketoacidosis Likely the cause of her metabolic acidosis.  Continue IV fluids with dextrose LR.   DVT prophylaxis: SCDs Code Status: Full Code by default Family Communication: no family at bedside  Disposition Plan: return home vs inpatient/residential drug/etoh rehab  Consults called: none  Admission status: Inpatient, Step Down Unit   Kristopher Oppenheim, DO [facility] 02/15/2022, 10:22 PM

## 2022-02-15 NOTE — Assessment & Plan Note (Signed)
Likely the cause of her metabolic acidosis.  Continue IV fluids with dextrose LR.

## 2022-02-15 NOTE — Assessment & Plan Note (Signed)
Acute.  Continue with IV potassium replacement.  Keep n.p.o. due to altered mental status.

## 2022-02-15 NOTE — Subjective & Objective (Signed)
Chief complaint is alcohol withdrawal History present illness: 41 year old African-American female history of alcohol abuse, alcoholic cirrhosis who was seen yesterday in the [facility], ER.  Apparently she was discharged.  She presented to [facility], ER.  Reportedly prior to triage she was agitated and altered.  She has been having vomiting since yesterday.  Patient was agitated the ER requiring four-point restraints and IV Haldol 10 mg.  She was started on CIWA protocol with and given 2 mg of IV Ativan every 4 hours x2.  Noted be hypokalemic with a serum potassium of 2.8.  IV potassium replacement started.  Patient's parents and husband are no longer in the ER.  [facility] hospitalist contacted for admission.  Patient unable to give any history or review of systems due to sedation with IV Ativan.

## 2022-02-16 DIAGNOSIS — F10231 Alcohol dependence with withdrawal delirium: Principal | ICD-10-CM

## 2022-02-16 LAB — MRSA NEXT GEN BY PCR, NASAL: MRSA by PCR Next Gen: NOT DETECTED

## 2022-02-16 LAB — CBC WITH DIFFERENTIAL/PLATELET
Abs Immature Granulocytes: 0.02 10*3/uL (ref 0.00–0.07)
Basophils Absolute: 0 10*3/uL (ref 0.0–0.1)
Basophils Relative: 0 %
Eosinophils Absolute: 0 10*3/uL (ref 0.0–0.5)
Eosinophils Relative: 0 %
HCT: 25.6 % — ABNORMAL LOW (ref 36.0–46.0)
Hemoglobin: 7.7 g/dL — ABNORMAL LOW (ref 12.0–15.0)
Immature Granulocytes: 0 %
Lymphocytes Relative: 21 %
Lymphs Abs: 1.1 10*3/uL (ref 0.7–4.0)
MCH: 26.5 pg (ref 26.0–34.0)
MCHC: 30.1 g/dL (ref 30.0–36.0)
MCV: 88 fL (ref 80.0–100.0)
Monocytes Absolute: 0.6 10*3/uL (ref 0.1–1.0)
Monocytes Relative: 10 %
Neutro Abs: 3.7 10*3/uL (ref 1.7–7.7)
Neutrophils Relative %: 69 %
Platelets: 43 10*3/uL — ABNORMAL LOW (ref 150–400)
RBC: 2.91 MIL/uL — ABNORMAL LOW (ref 3.87–5.11)
RDW: 19.2 % — ABNORMAL HIGH (ref 11.5–15.5)
WBC: 5.4 10*3/uL (ref 4.0–10.5)
nRBC: 0 % (ref 0.0–0.2)

## 2022-02-16 LAB — GLUCOSE, CAPILLARY
Glucose-Capillary: 99 mg/dL (ref 70–99)
Glucose-Capillary: 102 mg/dL — ABNORMAL HIGH (ref 70–99)

## 2022-02-16 LAB — COMPREHENSIVE METABOLIC PANEL
ALT: 21 U/L (ref 0–44)
AST: 202 U/L — ABNORMAL HIGH (ref 15–41)
Albumin: 3.1 g/dL — ABNORMAL LOW (ref 3.5–5.0)
Alkaline Phosphatase: 102 U/L (ref 38–126)
Anion gap: 9 (ref 5–15)
BUN: 9 mg/dL (ref 6–20)
CO2: 23 mmol/L (ref 22–32)
Calcium: 7.4 mg/dL — ABNORMAL LOW (ref 8.9–10.3)
Chloride: 111 mmol/L (ref 98–111)
Creatinine, Ser: 0.67 mg/dL (ref 0.44–1.00)
GFR, Estimated: >60 mL/min (ref >60)
Glucose, Bld: 107 mg/dL — ABNORMAL HIGH (ref 70–99)
Potassium: 3.3 mmol/L — ABNORMAL LOW (ref 3.5–5.1)
Sodium: 143 mmol/L (ref 135–145)
Total Bilirubin: 3.3 mg/dL — ABNORMAL HIGH (ref 0.3–1.2)
Total Protein: 7.6 g/dL (ref 6.5–8.1)

## 2022-02-16 LAB — AMYLASE: Amylase: 167 U/L — ABNORMAL HIGH (ref 28–100)

## 2022-02-16 LAB — URINALYSIS, ROUTINE W REFLEX MICROSCOPIC
Bacteria, UA: NONE SEEN
Bilirubin Urine: NEGATIVE
Glucose, UA: NEGATIVE mg/dL
Hgb urine dipstick: NEGATIVE
Ketones, ur: 20 mg/dL — AB
Nitrite: NEGATIVE
Protein, ur: 30 mg/dL — AB
Specific Gravity, Urine: 1.013 (ref 1.005–1.030)
pH: 6 (ref 5.0–8.0)

## 2022-02-16 LAB — TYPE AND SCREEN
ABO/RH(D): O POS
Antibody Screen: NEGATIVE

## 2022-02-16 LAB — MAGNESIUM: Magnesium: 1.3 mg/dL — ABNORMAL LOW (ref 1.7–2.4)

## 2022-02-16 LAB — RAPID URINE DRUG SCREEN, HOSP PERFORMED
Amphetamines: NOT DETECTED
Barbiturates: NOT DETECTED
Benzodiazepines: NOT DETECTED
Cocaine: NOT DETECTED
Opiates: NOT DETECTED
Tetrahydrocannabinol: NOT DETECTED

## 2022-02-16 LAB — HEMOGLOBIN AND HEMATOCRIT, BLOOD
HCT: 27.4 % — ABNORMAL LOW (ref 36.0–46.0)
HCT: 30.2 % — ABNORMAL LOW (ref 36.0–46.0)
Hemoglobin: 8.1 g/dL — ABNORMAL LOW (ref 12.0–15.0)
Hemoglobin: 9 g/dL — ABNORMAL LOW (ref 12.0–15.0)

## 2022-02-16 LAB — HEPATITIS PANEL, ACUTE
HCV Ab: NONREACTIVE
Hep A IgM: NONREACTIVE
Hep B C IgM: NONREACTIVE
Hepatitis B Surface Ag: NONREACTIVE

## 2022-02-16 LAB — LIPASE, BLOOD: Lipase: 35 U/L (ref 11–51)

## 2022-02-16 LAB — AMMONIA: Ammonia: 140 umol/L — ABNORMAL HIGH (ref 9–35)

## 2022-02-16 MED ORDER — METOPROLOL TARTRATE 5 MG/5ML IV SOLN
2.5000 mg | Freq: Once | INTRAVENOUS | Status: AC | PRN
  Administered 2022-02-16: 2.5 mg via INTRAVENOUS
  Filled 2022-02-16: qty 5

## 2022-02-16 MED ORDER — THIAMINE HCL 100 MG PO TABS: 100.0000 mg | ORAL_TABLET | Freq: Every day | ORAL | Status: DC

## 2022-02-16 MED ORDER — ONDANSETRON HCL 4 MG/2ML IJ SOLN
4.0000 mg | Freq: Four times a day (QID) | INTRAMUSCULAR | Status: DC | PRN
Start: 2022-02-16 — End: 2022-02-25

## 2022-02-16 MED ORDER — LACTULOSE ENEMA
300.0000 mL | Freq: Two times a day (BID) | ORAL | Status: AC
  Administered 2022-02-16 – 2022-02-17 (×4): 300 mL via RECTAL
  Filled 2022-02-16 (×6): qty 300

## 2022-02-16 MED ORDER — ORAL CARE MOUTH RINSE
15.0000 mL | Freq: Two times a day (BID) | OROMUCOSAL | Status: DC
  Administered 2022-02-16 – 2022-02-20 (×9): 15 mL via OROMUCOSAL

## 2022-02-16 MED ORDER — LORAZEPAM 2 MG/ML IJ SOLN
1.0000 mg | INTRAMUSCULAR | Status: AC | PRN
  Administered 2022-02-16 – 2022-02-17 (×3): 2 mg via INTRAVENOUS
  Administered 2022-02-17 (×4): 4 mg via INTRAVENOUS
  Administered 2022-02-17: 2 mg via INTRAVENOUS
  Administered 2022-02-17 – 2022-02-19 (×10): 4 mg via INTRAVENOUS
  Filled 2022-02-16 (×3): qty 2
  Filled 2022-02-16: qty 1
  Filled 2022-02-16 (×4): qty 2
  Filled 2022-02-16: qty 1
  Filled 2022-02-16: qty 2
  Filled 2022-02-16: qty 1
  Filled 2022-02-16 (×7): qty 2

## 2022-02-16 MED ORDER — PANTOPRAZOLE SODIUM 40 MG IV SOLR
40.0000 mg | Freq: Two times a day (BID) | INTRAVENOUS | Status: DC
  Administered 2022-02-16 – 2022-02-24 (×18): 40 mg via INTRAVENOUS
  Filled 2022-02-16 (×18): qty 10

## 2022-02-16 MED ORDER — SODIUM CHLORIDE 0.9 % IV SOLN
500.0000 mg | Freq: Three times a day (TID) | INTRAVENOUS | Status: AC
Start: 2022-02-16 — End: 2022-02-19
  Administered 2022-02-16 – 2022-02-19 (×10): 500 mg via INTRAVENOUS
  Filled 2022-02-16 (×10): qty 5

## 2022-02-16 MED ORDER — LORAZEPAM 1 MG PO TABS
1.0000 mg | ORAL_TABLET | ORAL | Status: AC | PRN
  Filled 2022-02-16 (×2): qty 4

## 2022-02-16 MED ORDER — CHLORHEXIDINE GLUCONATE CLOTH 2 % EX PADS
6.0000 | MEDICATED_PAD | Freq: Every day | CUTANEOUS | Status: DC
  Administered 2022-02-17 – 2022-02-25 (×12): 6 via TOPICAL

## 2022-02-16 MED ORDER — CHLORHEXIDINE GLUCONATE 0.12 % MT SOLN
15.0000 mL | Freq: Two times a day (BID) | OROMUCOSAL | Status: DC
  Administered 2022-02-16 – 2022-02-20 (×8): 15 mL via OROMUCOSAL
  Filled 2022-02-16 (×7): qty 15

## 2022-02-16 MED ORDER — MAGNESIUM SULFATE 2 GM/50ML IV SOLN
2.0000 g | Freq: Once | INTRAVENOUS | Status: AC
Start: 2022-02-16 — End: 2022-02-16
  Administered 2022-02-16: 2 g via INTRAVENOUS
  Filled 2022-02-16: qty 50

## 2022-02-16 MED ORDER — ACETAMINOPHEN 650 MG RE SUPP: 650.0000 mg | RECTAL | Status: DC | PRN

## 2022-02-16 MED ORDER — THIAMINE HCL 100 MG/ML IJ SOLN: 100.0000 mg | Freq: Every day | INTRAMUSCULAR | Status: DC

## 2022-02-16 MED ORDER — ADULT MULTIVITAMIN W/MINERALS CH
1.0000 | ORAL_TABLET | Freq: Every day | ORAL | Status: DC
  Administered 2022-02-18: 1 via ORAL
  Filled 2022-02-16 (×2): qty 1

## 2022-02-16 MED ORDER — POTASSIUM CHLORIDE 10 MEQ/100ML IV SOLN
10.0000 meq | INTRAVENOUS | Status: AC
  Administered 2022-02-16 (×4): 10 meq via INTRAVENOUS
  Filled 2022-02-16 (×3): qty 100

## 2022-02-16 MED ORDER — FOLIC ACID 1 MG PO TABS
1.0000 mg | ORAL_TABLET | Freq: Every day | ORAL | Status: DC
  Administered 2022-02-18: 1 mg via ORAL
  Filled 2022-02-16 (×2): qty 1

## 2022-02-16 MED ORDER — MAGNESIUM SULFATE 2 GM/50ML IV SOLN
2.0000 g | Freq: Once | INTRAVENOUS | Status: AC
  Administered 2022-02-16: 2 g via INTRAVENOUS
  Filled 2022-02-16: qty 50

## 2022-02-16 NOTE — Consult Note (Signed)
NAME:  Joann [name], MRN:  [id number], DOB:  [birth year], LOS: 1 ADMISSION DATE:  02/15/2022, CONSULTATION DATE:  02/16/2022 REFERRING MD:  Dr. Erlinda Hong - [facility], CHIEF COMPLAINT:  ETOH withdrawal    History of Present Illness:  Joann [name] is a 41 y.o. female with a PMH significant for ETOH use, alcohol induced pancreatitis, hepatic encephalopathy, HTN, and anxiety who presented to the ED 6/8 for nausea and vomiting with later development of agitation and AMS.   While in ED patient became progressively agitated requiring application of 4 point restraints. She was seen tachycardic, with all other vitals within normal limits. CIWA protocol was initiated for acute alcohol withdrawal and patient was admitted per [facility].   By morning of 6/9 patient was seen with change in mentation (less arousable) with observed diaphoresis and tremors prompting move to ICU and PCCM consult   Pertinent  Medical History  ETOH use, alcohol induced pancreatitis, hepatic encephalopathy, HTN, and anxiety  Significant Hospital Events: Including procedures, antibiotic start and stop dates in addition to other pertinent events   6/8 Presented with nausea and vomiting with later development of agitation and AMS.  6/9 Worsening withdrawal prompting move to ICU and PCCM consult   Interim History / Subjective:  As above   Objective   Blood pressure 100/73, pulse 64, temperature 97.6 F (36.4 C), resp. rate (!) 22, height 5' 3"  (1.6 m), weight 73 kg, SpO2 97 %.        Intake/Output Summary (Last 24 hours) at 02/16/2022 1051 Last data filed at 02/16/2022 0017 Gross per 24 hour  Intake 100 ml  Output --  Net 100 ml   Filed Weights   02/15/22 1507  Weight: 73 kg    Examination: General: Acute ill appearing adult female lying in bed, in NAD HEENT: [place]/AT, MM pink/moist, PERRL,  Neuro: Encephalopathic, unable to follow any commands, sponaneously seen moving all extremities  CV: s1s2 regular rate and rhythm, no murmur, rubs, or  gallops,  PULM:  Clear to ascultation, no increased work of breathing, no added breath sounds  GI: soft, bowel sounds active in all 4 quadrants, non-tender, non-distended, tolerating TF Extremities: warm/dry, no edema  Skin: no rashes or lesions   Resolved Hospital Problem list     Assessment & Plan:  Acute alcohol withdrawal  -Unknown amount of ETOH use  -Appears patient has received upward of 39m of Ativan and 137mof Haldol since admit, currently she is restless to agitation but sleepy when unprovoked P: Currently protecting her airway  CIWA protocol  Precedex drip if withdrawal symptoms worsen  Supplement thiamine, folate, and MVI  Seizure precautions  Continue restraints  NPO  Aspiration precautions   Hx of alcoholic cirrhosis -ABD [place]/02/2022 consistent with liver cirrhosis and probable portal hypertension  Elevated Bilirubin  -Total bilirubin 3.3 Elevated ammonia  -140 on 6/8 P: Avoid hepatotoxins Alcohol cessation education when appropriate  Intermittently follow LFTs  Continue Lactulose enemas  Trend Ammonia   Hypokalemia  P: Trend Bmet  Supplement as needed    Best Practice (right click and "Reselect all SmartList Selections" daily)   Diet/type: NPO DVT prophylaxis: SCD GI prophylaxis: PPI Lines: N/A Foley:  N/A Code Status:  full code Last date of multidisciplinary goals of care discussion: Updated family on arrival to ICU  Labs   CBC: Recent Labs  Lab 02/14/22 1215 02/15/22 1705 02/16/22 0500 02/16/22 0945  WBC 4.1 8.2 5.4  --   NEUTROABS 2.5 6.7 3.7  --  HGB 11.2* 9.9* 7.7* 8.1*  HCT 37.6 32.8* 25.6* 27.4*  MCV 86.2 85.4 88.0  --   PLT 67* 84* 43*  --     Basic Metabolic Panel: Recent Labs  Lab 02/14/22 1215 02/15/22 1705 02/16/22 0500  NA 143 142 143  K 2.8* 2.8* 3.3*  CL 108 100 111  CO2 17* 14* 23  GLUCOSE 114* 129* 107*  BUN <5* 10 9  CREATININE 0.67 1.10* 0.67  CALCIUM 8.3* 8.6* 7.4*  MG  --   --  1.3*    GFR: Estimated Creatinine Clearance: 87.6 mL/min (by C-G formula based on SCr of 0.67 mg/dL). Recent Labs  Lab 02/14/22 1215 02/15/22 1705 02/16/22 0500  WBC 4.1 8.2 5.4    Liver Function Tests: Recent Labs  Lab 02/14/22 1215 02/15/22 1705 02/16/22 0500  AST 133* <5* 202*  ALT 17 <5 21  ALKPHOS 170* 151* 102  BILITOT 2.0* 5.0* 3.3*  PROT 10.3* 10.1* 7.6  ALBUMIN 3.8 4.1 3.1*   No results for input(s): "LIPASE", "AMYLASE" in the last 168 hours. Recent Labs  Lab 02/15/22 2347  AMMONIA 140*    ABG    Component Value Date/Time   PHART 7.448 12/31/2019 0826   PCO2ART 38.2 12/31/2019 0826   PO2ART 346 (H) 12/31/2019 0826   HCO3 26.3 12/31/2019 0826   TCO2 27 12/31/2019 0826   ACIDBASEDEF 2.5 (H) 12/30/2019 1655   O2SAT 100.0 12/31/2019 0826     Coagulation Profile: No results for input(s): "INR", "PROTIME" in the last 168 hours.  Cardiac Enzymes: No results for input(s): "CKTOTAL", "CKMB", "CKMBINDEX", "TROPONINI" in the last 168 hours.  HbA1C: Hgb A1c MFr Bld  Date/Time Value Ref Range Status  09/29/2018 03:23 PM 4.8 4.8 - 5.6 % Final    Comment:             Prediabetes: 5.7 - 6.4          Diabetes: >6.4          Glycemic control for adults with diabetes: <7.0     CBG: No results for input(s): "GLUCAP" in the last 168 hours.  Review of Systems:   Unable to assess   Past Medical History:  She,  has a past medical history of [redacted] weeks gestation of pregnancy (03/22/2020), Alcohol abuse, Alcohol-induced acute pancreatitis, Anxiety, Elevated LFTs (10/24/2018), Hepatic encephalopathy ([place]) (12/24/2019), Hypertension, and Liver disease.   Surgical History:   Past Surgical History:  Procedure Laterality Date   ECTOPIC PREGNANCY SURGERY     ESOPHAGOGASTRODUODENOSCOPY (EGD) WITH PROPOFOL N/A 12/27/2019   Procedure: ESOPHAGOGASTRODUODENOSCOPY (EGD) WITH PROPOFOL;  Surgeon: Carol Ada, MD;  Location: [facility];  Service: Endoscopy;  Laterality: N/A;      Social History:   reports that she has never smoked. She has never used smokeless tobacco. She reports current alcohol use. She reports that she does not use drugs.   Family History:  Her family history includes Alcohol abuse in her father and mother; Diabetes in her father; Healthy in her brother, brother, brother, and mother; Hypertension in her maternal grandfather and maternal grandmother.   Allergies Allergies  Allergen Reactions   Latex Hives    Patient stated that she is allergic to latex     Home Medications  Prior to Admission medications   Medication Sig Start Date End Date Taking? Authorizing Provider  folic acid (FOLVITE) 1 MG tablet Take 1 tablet (1 mg total) by mouth daily. 12/22/21  Yes France Ravens, MD  lactulose [facility]) 10  GM/15ML solution Take 20 g by mouth daily as needed for mild constipation.   Yes [provider]  Multiple Vitamin (MULTIVITAMIN WITH MINERALS) TABS tablet Take 1 tablet by mouth daily. 01/15/20  Yes Angiulli, Lavon Paganini, PA-C  pantoprazole (PROTONIX) 40 MG tablet Take 1 tablet (40 mg total) by mouth 2 (two) times daily. 12/22/21  Yes Maxwell, Allee, MD  potassium chloride SA (KLOR-CON M) 20 MEQ tablet Take 2 tablets (40 mEq total) by mouth daily. 12/23/21 02/15/22 Yes France Ravens, MD  rifaximin (XIFAXAN) 550 MG TABS tablet Take 1 tablet (550 mg total) by mouth 2 (two) times daily. 12/22/21  Yes Erskine Emery, MD  traZODone (DESYREL) 150 MG tablet Take 150 mg by mouth at bedtime. 01/11/22  Yes [provider]  vitamin B-12 (CYANOCOBALAMIN) 100 MCG tablet Take 100 mcg by mouth daily.   Yes [provider]  zinc sulfate 220 (50 Zn) MG capsule Take 1 capsule (220 mg total) by mouth daily. 01/15/20  Yes Angiulli, Lavon Paganini, PA-C     Critical care time: 55mns  Reese Stockman D. HKenton Kingfisher NP-C [facility] Personal contact information can be found on Amion  02/16/2022, 1:20 PM

## 2022-02-16 NOTE — Progress Notes (Signed)
Serum NH3 of 140. Pt not safe for po meds/liquids. Will order lactulose retention enemas.

## 2022-02-16 NOTE — Progress Notes (Signed)
[facility] Progress Note Patient Name: Joann [name] DOB: Jul 01, 1981 MRN: [id number]   Date of Service  02/16/2022  HPI/Events of Note  Patient needs an order to renew restraints, order CBG's, and a PRN order for Lopressor for sinus tachycardia with rates in the 130's, she is not visibly agitated and is 1 liter positive on her iv fluid balance.  eICU Interventions  Orders entered.        Kerry Kass Kessler Kopinski 02/16/2022, 9:19 PM

## 2022-02-16 NOTE — Progress Notes (Signed)
Awaiting bed availability in ICU/stepdown. Pt mother, Erma updated this am. 4point non-violent restraints in place (see documentation). CIWA protocol in place and MD Xu came to assess pt yellow/red mews. Unable to answer orientation questions, calm at this current moment -s/p ativan 52m IV -pt was diaphoretic, agitated & had tremors. Pt door open & room close to nursing station for frequent monitoring until higher acuity bed becomes available

## 2022-02-16 NOTE — Progress Notes (Signed)
PROGRESS NOTE    Joann [name]  WIO:[id number] DOB: 06-30-1981 DOA: 02/15/2022 PCP: Pcp, No     Brief Narrative:   alcohol abuse, alcoholic cirrhosis who was seen yesterday in the [facility], ER.  Apparently she was discharged.  She presented to [facility], ER.  Reportedly prior to triage she was agitated and altered.  She has been having vomiting since yesterday.   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 Mother reports patient decided to go to rehab facility before becoming sick this time, they already have a rehab facility lined up, they plan to get her there once she improves  Assessment & Plan:  Principal Problem:   Acute, mixed level of activity, alcohol withdrawal delirium ([place]) Active Problems:   Alcoholic ketoacidosis   Thrombocytopenia (H[facility])   Hypokalemia   Elevated bilirubin   Alcoholic cirrhosis ([place])    Assessment and Plan:  Acute toxic and metabolic encephalopathy in the setting of alcohol withdrawal and possible hepatic encephalopathy -Was initially very agitated requiring four-point restraint in the ED, now minimally awake, but able to maintain airway -Family report patient history of alcohol withdrawal seizure requiring intubation -Continue CIWA protocol, IV thiamine - will transfer to ICU stepdown for close monitoring, currently too drowsy to take any oral, keep n.p.o., if not improving will need NG insertion, continue IV hydration D5 LR -Continue lactulose enema -Critical care consulted  Hypokalemia/hypomagnesemia IV replacement while n.p.o.  Alcoholic cirrhosis with history of esophageal varices.  Last EGD appears to be in April 2021.  Continue with IV Protonix.  Does not appear to be  bleeding.  Abnormal LFT  From alcohol intoxication and cirrhosis  Trend LFT     Normocytic anemia Thrombocytopenia  -No overt sign of bleeding, likely from alcohol use and cirrhosis -Monitor CBC   Alcohol abuse Long history Mother report patient desire to go to rehab facility at discharge   I have Reviewed nursing notes, Vitals, pain scores, I/o's, Lab results and  imaging results since pt's last encounter, details please see discussion above  I ordered the following labs:  Unresulted Labs (From admission, onward)     Start     Ordered   02/17/22 0500  Comprehensive metabolic panel  Daily,   R      02/16/22 0919   02/17/22 0500  Magnesium  Tomorrow morning,   R        02/16/22 0919   02/17/22 0500  Ammonia  Daily,   R      02/16/22 0919   02/16/22 1403  MRSA Next Gen by PCR, Nasal  Once,   R        02/16/22 1403   02/16/22 0920  Hemoglobin and hematocrit, blood  5A & 5P,   R (with TIMED occurrences)      02/16/22 0919   Unscheduled  Occult blood card to lab, stool  As needed,   R      02/16/22 0916             DVT prophylaxis: SCDs Start: 02/16/22 0325   Code Status:   Code Status: Full Code  Family Communication: Mother updated over the phone Disposition:    Dispo: The patient is from: Home  Anticipated d/c is to: TBD              Anticipated d/c date is: Patient currently is critically ill  Antimicrobials:    Anti-infectives (From admission, onward)    None          Objective: Vitals:   02/16/22 1400 02/16/22 1500 02/16/22 1530 02/16/22 1600  BP: (!) 93/57 104/66  96/62  Pulse:  (!) 103  96  Resp: (!) 24 20  17   Temp:   97.7 F (36.5 C)   TempSrc:   Axillary   SpO2: 100% 99%  100%  Weight:      Height:        Intake/Output Summary (Last 24 hours) at 02/16/2022 1818 Last data filed at 02/16/2022 1600 Gross per 24 hour  Intake 1457.04 ml  Output 500 ml  Net 957.04 ml   Filed Weights   02/15/22 1507  Weight: 73 kg     Examination:  General exam: Opens eyes briefly to voice, not following command Respiratory system: Clear to auscultation. Respiratory effort normal. Cardiovascular system: Sinus tachycardia Gastrointestinal system: Abdomen is nondistended, soft and nontender.  Normal bowel sounds heard. Central nervous system: Opens eyes briefly to voice, not following command Extremities:  no edema Skin: No rashes, lesions or ulcers Psychiatry: Currently no agitation    Data Reviewed: I have personally reviewed  labs and visualized  imaging studies since the last encounter and formulate the plan        Scheduled Meds:  chlorhexidine  15 mL Mouth Rinse BID   [START ON 02/17/2022] Chlorhexidine Gluconate Cloth  6 each Topical U9323   folic acid  1 mg Oral Daily   lactulose  300 mL Rectal BID   LORazepam  0-4 mg Intravenous Q6H   Or   LORazepam  0-4 mg Oral Q6H   [START ON 02/17/2022] LORazepam  0-4 mg Intravenous Q12H   Or   [START ON 02/17/2022] LORazepam  0-4 mg Oral Q12H   mouth rinse  15 mL Mouth Rinse q12n4p   multivitamin with minerals  1 tablet Oral Daily   pantoprazole (PROTONIX) IV  40 mg Intravenous Q12H   Continuous Infusions:  dextrose 5% lactated ringers 100 mL/hr at 02/16/22 1600   thiamine injection       LOS: 1 day     Florencia Reasons, MD PhD FACP [facility]  Available via Epic secure chat 7am-7pm for nonurgent issues Please page for urgent issues To page the attending provider between 7A-7P or the covering provider during after hours 7P-7A, please log into the web site [email or web address] and access using universal  password for that web site. If you do not have the password, please call the hospital operator.    02/16/2022, 6:18 PM

## 2022-02-16 NOTE — TOC Progression Note (Signed)
Transition of Care [facility]) - Progression Note    Patient Details  Name: Joann [name] MRN: [id number] Date of Birth: Jun 12, 1981  [facility]) CM/SW Contact  Leeroy Cha, RN Phone Number: 02/16/2022, 11:42 AM  Clinical Narrative:    Patient being transferred to icu due to condition and agitiation.  No toc needs present.   Expected Discharge Plan: Home/Self Care Barriers to Discharge: Continued Medical Work up  Expected Discharge Plan and Services Expected Discharge Plan: Home/Self Care   Discharge Planning Services: CM Consult   Living arrangements for the past 2 months: Single Family Home                                       Social Determinants of Health (SDOH) Interventions    Readmission Risk Interventions     No data to display

## 2022-02-16 NOTE — TOC Initial Note (Signed)
Transition of Care [facility]) - Initial/Assessment Note    Patient Details  Name: Joann [name] MRN: [id number] Date of Birth: 1980-12-15  Transition of Care [facility]) CM/SW Contact:    Leeroy Cha, RN Phone Number: 02/16/2022, 8:24 AM  Clinical Narrative:                  Transition of Care [facility]) Screening Note   Patient Details  Name: Joann [name] Date of Birth: 02-03-1981   Transition of Care [facility]) CM/SW Contact:    Leeroy Cha, RN Phone Number: 02/16/2022, 8:24 AM    Transition of Care Department (TOC) has reviewed patient and no TOC needs have been identified at this time. We will continue to monitor patient advancement through interdisciplinary progression rounds. If new patient transition needs arise, please place a TOC consult.    Expected Discharge Plan: Home/Self Care Barriers to Discharge: Continued Medical Work up   Patient Goals and CMS Choice Patient states their goals for this hospitalization and ongoing recovery are:: unstable to state etoh w/d      Expected Discharge Plan and Services Expected Discharge Plan: Home/Self Care   Discharge Planning Services: CM Consult   Living arrangements for the past 2 months: Single Family Home                                      Prior Living Arrangements/Services Living arrangements for the past 2 months: Single Family Home Lives with:: Spouse Patient language and need for interpreter reviewed:: Yes              Criminal Activity/Legal Involvement Pertinent to Current Situation/Hospitalization: No - Comment as needed  Activities of Daily Living      Permission Sought/Granted                  Emotional Assessment Appearance:: Appears stated age Attitude/Demeanor/Rapport: Unable to Assess Affect (typically observed): Unable to Assess Orientation: : Fluctuating Orientation (Suspected and/or reported [place]) Alcohol / Substance Use: Alcohol Use Psych Involvement: No  (comment)  Admission diagnosis:  Alcohol withdrawal delirium ([place]) [F10.931] Hypokalemia [E87.6] Acute, mixed level of activity, alcohol withdrawal delirium ([place]) [F10.231] Patient Active Problem List   Diagnosis Date Noted   Acute, mixed level of activity, alcohol withdrawal delirium ([place]) 02/15/2022   Symptomatic anemia 12/17/2021   Esophageal varices without bleeding ([place]) 03/00/9233   Alcoholic cirrhosis ([place]) 00/76/2263   Reactive depression    Wernicke's encephalopathy    Transaminitis    Steroid-induced hyperglycemia    Leucocytosis    Orthostasis    Macrocytic anemia    AMS (altered mental status)    Sleep disturbance    Pressure injury of skin 33/54/5625   Alcoholic hepatitis with ascites    Acute respiratory failure [facility])    Palliative care encounter    Elevated bilirubin    Ascites    Advanced care planning/counseling discussion    Goals of care, counseling/discussion    Palliative care by specialist    Hypoalbuminemia    Hypokalemia 04/02/2019   Alcohol dependence with withdrawal, unspecified ([place]) 10/24/2018   Thrombocytopenia ([place]) 63/89/3734   Alcoholic ketoacidosis 28/76/8115   Alcohol abuse with alcohol-induced mood disorder ([place]) 09/14/2017   PCP:  Pcp, No Pharmacy:   [id number] [place] [place]Phone: [phone]: [phone]    Social Determinants of  Health (SDOH) Interventions    Readmission Risk Interventions     No data to display

## 2022-02-17 ENCOUNTER — Inpatient Hospital Stay (HOSPITAL_COMMUNITY): Payer: Medicaid Other

## 2022-02-17 DIAGNOSIS — F10231 Alcohol dependence with withdrawal delirium: Secondary | ICD-10-CM | POA: Diagnosis not present

## 2022-02-17 LAB — AMMONIA: Ammonia: 92 umol/L — ABNORMAL HIGH (ref 9–35)

## 2022-02-17 LAB — CBC
HCT: 25.8 % — ABNORMAL LOW (ref 36.0–46.0)
Hemoglobin: 7.4 g/dL — ABNORMAL LOW (ref 12.0–15.0)
MCH: 25.9 pg — ABNORMAL LOW (ref 26.0–34.0)
MCHC: 28.7 g/dL — ABNORMAL LOW (ref 30.0–36.0)
MCV: 90.2 fL (ref 80.0–100.0)
Platelets: 37 10*3/uL — ABNORMAL LOW (ref 150–400)
RBC: 2.86 MIL/uL — ABNORMAL LOW (ref 3.87–5.11)
RDW: 19.9 % — ABNORMAL HIGH (ref 11.5–15.5)
WBC: 2.4 10*3/uL — ABNORMAL LOW (ref 4.0–10.5)
nRBC: 0 % (ref 0.0–0.2)

## 2022-02-17 LAB — COMPREHENSIVE METABOLIC PANEL
ALT: 23 U/L (ref 0–44)
AST: 184 U/L — ABNORMAL HIGH (ref 15–41)
Albumin: 2.9 g/dL — ABNORMAL LOW (ref 3.5–5.0)
Alkaline Phosphatase: 96 U/L (ref 38–126)
Anion gap: 7 (ref 5–15)
BUN: <5 mg/dL — ABNORMAL LOW (ref 6–20)
CO2: 20 mmol/L — ABNORMAL LOW (ref 22–32)
Calcium: 7.8 mg/dL — ABNORMAL LOW (ref 8.9–10.3)
Chloride: 117 mmol/L — ABNORMAL HIGH (ref 98–111)
Creatinine, Ser: 0.59 mg/dL (ref 0.44–1.00)
GFR, Estimated: >60 mL/min (ref >60)
Glucose, Bld: 134 mg/dL — ABNORMAL HIGH (ref 70–99)
Potassium: 2.6 mmol/L — CL (ref 3.5–5.1)
Sodium: 144 mmol/L (ref 135–145)
Total Bilirubin: 2.6 mg/dL — ABNORMAL HIGH (ref 0.3–1.2)
Total Protein: 7.1 g/dL (ref 6.5–8.1)

## 2022-02-17 LAB — URINALYSIS, ROUTINE W REFLEX MICROSCOPIC
Bacteria, UA: NONE SEEN
Bilirubin Urine: NEGATIVE
Glucose, UA: NEGATIVE mg/dL
Ketones, ur: NEGATIVE mg/dL
Leukocytes,Ua: NEGATIVE
Nitrite: NEGATIVE
Protein, ur: NEGATIVE mg/dL
Specific Gravity, Urine: 1.009 (ref 1.005–1.030)
pH: 8 (ref 5.0–8.0)

## 2022-02-17 LAB — GLUCOSE, CAPILLARY
Glucose-Capillary: 102 mg/dL — ABNORMAL HIGH (ref 70–99)
Glucose-Capillary: 126 mg/dL — ABNORMAL HIGH (ref 70–99)
Glucose-Capillary: 78 mg/dL (ref 70–99)
Glucose-Capillary: 79 mg/dL (ref 70–99)
Glucose-Capillary: 97 mg/dL (ref 70–99)

## 2022-02-17 LAB — MAGNESIUM: Magnesium: 1.6 mg/dL — ABNORMAL LOW (ref 1.7–2.4)

## 2022-02-17 MED ORDER — SODIUM CHLORIDE 0.9 % IV SOLN
1.0000 g | INTRAVENOUS | Status: AC
  Administered 2022-02-17 – 2022-02-23 (×7): 1 g via INTRAVENOUS
  Filled 2022-02-17 (×7): qty 10

## 2022-02-17 MED ORDER — LORAZEPAM 2 MG/ML IJ SOLN
1.0000 mg | INTRAMUSCULAR | Status: DC | PRN
  Administered 2022-02-19 (×2): 1 mg via INTRAVENOUS
  Filled 2022-02-17 (×2): qty 1

## 2022-02-17 MED ORDER — PHENOBARBITAL 32.4 MG PO TABS
64.8000 mg | ORAL_TABLET | Freq: Three times a day (TID) | ORAL | Status: AC
  Administered 2022-02-19 – 2022-02-21 (×6): 64.8 mg
  Filled 2022-02-17 (×6): qty 2

## 2022-02-17 MED ORDER — POTASSIUM CHLORIDE 2 MEQ/ML IV SOLN
INTRAVENOUS | Status: DC
  Filled 2022-02-17 (×6): qty 1000

## 2022-02-17 MED ORDER — LACTULOSE 10 GM/15ML PO SOLN
20.0000 g | Freq: Two times a day (BID) | ORAL | Status: DC
  Administered 2022-02-17 – 2022-02-19 (×5): 20 g
  Filled 2022-02-17 (×5): qty 30

## 2022-02-17 MED ORDER — RIFAXIMIN 550 MG PO TABS
550.0000 mg | ORAL_TABLET | Freq: Two times a day (BID) | ORAL | Status: DC
  Administered 2022-02-17 – 2022-02-22 (×11): 550 mg
  Filled 2022-02-17 (×11): qty 1

## 2022-02-17 MED ORDER — POTASSIUM CHLORIDE 2 MEQ/ML IV SOLN: INTRAVENOUS | Status: DC

## 2022-02-17 MED ORDER — PHENOBARBITAL 32.4 MG PO TABS
32.4000 mg | ORAL_TABLET | Freq: Three times a day (TID) | ORAL | Status: DC
  Administered 2022-02-22 – 2022-02-23 (×3): 32.4 mg
  Filled 2022-02-17 (×4): qty 1

## 2022-02-17 MED ORDER — SODIUM CHLORIDE 0.9% FLUSH: 10.0000 mL | INTRAVENOUS | Status: DC | PRN

## 2022-02-17 MED ORDER — POTASSIUM CHLORIDE 10 MEQ/100ML IV SOLN
10.0000 meq | INTRAVENOUS | Status: AC
  Administered 2022-02-17 (×4): 10 meq via INTRAVENOUS
  Filled 2022-02-17 (×4): qty 100

## 2022-02-17 MED ORDER — SODIUM CHLORIDE 0.9 % IV SOLN
260.0000 mg | Freq: Once | INTRAVENOUS | Status: AC
  Administered 2022-02-17: 260 mg via INTRAVENOUS
  Filled 2022-02-17: qty 2

## 2022-02-17 MED ORDER — PHENOBARBITAL 32.4 MG PO TABS
97.2000 mg | ORAL_TABLET | Freq: Three times a day (TID) | ORAL | Status: AC
  Administered 2022-02-17 – 2022-02-19 (×5): 97.2 mg
  Filled 2022-02-17 (×6): qty 3

## 2022-02-17 MED ORDER — DEXMEDETOMIDINE HCL IN NACL 200 MCG/50ML IV SOLN
0.4000 ug/kg/h | INTRAVENOUS | Status: DC
  Administered 2022-02-17: 0.4 ug/kg/h via INTRAVENOUS
  Administered 2022-02-17: 0.8 ug/kg/h via INTRAVENOUS
  Filled 2022-02-17 (×2): qty 50

## 2022-02-17 MED ORDER — ACETAMINOPHEN 160 MG/5ML PO SOLN
650.0000 mg | ORAL | Status: DC | PRN
  Administered 2022-02-17: 650 mg
  Filled 2022-02-17: qty 20.3

## 2022-02-17 MED ORDER — SODIUM CHLORIDE 0.9% FLUSH
10.0000 mL | Freq: Two times a day (BID) | INTRAVENOUS | Status: DC
  Administered 2022-02-17: 30 mL
  Administered 2022-02-17 – 2022-02-22 (×10): 10 mL

## 2022-02-17 MED ORDER — SODIUM CHLORIDE 0.9% IV SOLUTION: Freq: Once | INTRAVENOUS | Status: AC

## 2022-02-17 NOTE — Progress Notes (Signed)

## 2022-02-17 NOTE — Plan of Care (Signed)
Discussed in front of patient plan of care for the evening, pain management and anxiety medications with no evidence of learning at this time.  Problem: Safety: Goal: Violent Restraint(s) Outcome: Progressing   Problem: Education: Goal: Knowledge of General Education information will improve Description: Including pain rating scale, medication(s)/side effects and non-pharmacologic comfort measures Outcome: Not Progressing

## 2022-02-17 NOTE — Progress Notes (Signed)
eLink Physician-Brief Progress Note Patient Name: Joann [name] DOB: [birth year] MRN: [id number]   Date of Service  02/17/2022  HPI/Events of Note  Patient with severe ETOH withdrawal delirium not being controlled by Benzodiazepines at this time.  eICU Interventions  Precedex gtt ordered.        [name] 02/17/2022, 2:42 AM

## 2022-02-17 NOTE — Progress Notes (Signed)
eLink Physician-Brief Progress Note Patient Name: Joann [name] DOB: [birth year] MRN: [id number]   Date of Service  02/17/2022  HPI/Events of Note  KUB reviewed.  eICU Interventions  NG tube in good position.        Kerry Kass Ketura Sirek 02/17/2022, 10:20 PM

## 2022-02-17 NOTE — Progress Notes (Addendum)
NAME:  Joann [name], MRN:  [id number], DOB:  [birth year], LOS: 2 ADMISSION DATE:  02/15/2022, CONSULTATION DATE:  02/16/2022 REFERRING MD:  Dr. Erlinda Hong - [facility], CHIEF COMPLAINT:  ETOH withdrawal    History of Present Illness:  Joann [name] is a 41 y.o. female with a PMH significant for ETOH use, alcohol induced pancreatitis, hepatic encephalopathy, HTN, and anxiety who presented to the ED 6/8 for nausea and vomiting with later development of agitation and AMS.   While in ED patient became progressively agitated requiring application of 4 point restraints. She was seen tachycardic, with all other vitals within normal limits. CIWA protocol was initiated for acute alcohol withdrawal and patient was admitted per [facility].   By morning of 6/9 patient was seen with change in mentation (less arousable) with observed diaphoresis and tremors prompting move to ICU and PCCM consult   Pertinent  Medical History  ETOH use, alcohol induced pancreatitis, hepatic encephalopathy, HTN, and anxiety  Significant Hospital Events: Including procedures, antibiotic start and stop dates in addition to other pertinent events   6/8 Presented with nausea and vomiting with later development of agitation and AMS.  6/9 Worsening withdrawal prompting move to ICU and PCCM consult   Interim History / Subjective:  Precedex started but only received 2 mg IV ativan at 130 AM, ~12 hours since previous dose. Intermittently screeches. A bit more alert today but still encephalopathic.  Objective   Blood pressure 98/60, pulse 86, temperature 97.6 F (36.4 C), temperature source Axillary, resp. rate (!) 28, height 5' 3"  (1.6 m), weight 73.4 kg, SpO2 98 %.        Intake/Output Summary (Last 24 hours) at 02/17/2022 0818 Last data filed at 02/17/2022 0754 Gross per 24 hour  Intake 2720.52 ml  Output 2400 ml  Net 320.52 ml    Filed Weights   02/15/22 1507 02/17/22 0440  Weight: 73 kg 73.4 kg    Examination: General: Acute ill appearing  adult female lying in bed, in NAD HEENT: [place]/AT, MM pink/moist, PERRL,  Neuro: Encephalopathic, unable to follow any commands, sponaneously seen moving all extremities, responds to verbal questions with one word answers CV: s1s2 regular rate and rhythm, no murmur, rubs, or gallops,  PULM:  Clear to ascultation, no increased work of breathing, no added breath sounds  GI: soft, bowel sounds active in all 4 quadrants, non-tender, non-distended, tolerating TF Extremities: warm/dry, no edema  Skin: no rashes or lesions   Resolved Hospital Problem list     Assessment & Plan:  Toxic metabolic encephalopathy: In setting of alcohol withdrawal, benzodiazepine and antipsychotic administration.  Possible hepatic encephalopathy. --See below --Consider NG tube placement for lactulose administration if worsens   Alcohol withdrawal: Tremulous, combative, disoriented prior to arrival to the ICU.  Treated with IV benzodiazepines.  Now resting comfortably.  Will arouse. --Continue CIWA, Ativan as needed --If finding patient is becoming difficult this score on CIWA but having signs of withdrawal, recommend initiating phenobarbital taper as Precedex has no GABAergic effect and is an inappropriate solo treatment for alcohol withdrawal --precedex d/c'd   Bilirubinemia: Likely in the setting of liver damage from alcohol, cirrhosis. --Supportive measures, avoid hepatotoxins    Best Practice (right click and "Reselect all SmartList Selections" daily)   Diet/type: NPO DVT prophylaxis: SCD GI prophylaxis: PPI Lines: N/A Foley:  N/A Code Status:  full code Last date of multidisciplinary goals of care discussion: Updated family on arrival to ICU  Labs   CBC: Recent Labs  Lab 02/14/22 1215 02/15/22 1705 02/16/22 0500 02/16/22 0945 02/16/22 1634  WBC 4.1 8.2 5.4  --   --   NEUTROABS 2.5 6.7 3.7  --   --   HGB 11.2* 9.9* 7.7* 8.1* 9.0*  HCT 37.6 32.8* 25.6* 27.4* 30.2*  MCV 86.2 85.4 88.0  --   --    PLT 67* 84* 43*  --   --      Basic Metabolic Panel: Recent Labs  Lab 02/14/22 1215 02/15/22 1705 02/16/22 0500  NA 143 142 143  K 2.8* 2.8* 3.3*  CL 108 100 111  CO2 17* 14* 23  GLUCOSE 114* 129* 107*  BUN <5* 10 9  CREATININE 0.67 1.10* 0.67  CALCIUM 8.3* 8.6* 7.4*  MG  --   --  1.3*    GFR: Estimated Creatinine Clearance: 87.9 mL/min (by C-G formula based on SCr of 0.67 mg/dL). Recent Labs  Lab 02/14/22 1215 02/15/22 1705 02/16/22 0500  WBC 4.1 8.2 5.4     Liver Function Tests: Recent Labs  Lab 02/14/22 1215 02/15/22 1705 02/16/22 0500  AST 133* <5* 202*  ALT 17 <5 21  ALKPHOS 170* 151* 102  BILITOT 2.0* 5.0* 3.3*  PROT 10.3* 10.1* 7.6  ALBUMIN 3.8 4.1 3.1*    Recent Labs  Lab 02/16/22 0949  LIPASE 35  AMYLASE 167*   Recent Labs  Lab 02/15/22 2347  AMMONIA 140*     ABG    Component Value Date/Time   PHART 7.448 12/31/2019 0826   PCO2ART 38.2 12/31/2019 0826   PO2ART 346 (H) 12/31/2019 0826   HCO3 26.3 12/31/2019 0826   TCO2 27 12/31/2019 0826   ACIDBASEDEF 2.5 (H) 12/30/2019 1655   O2SAT 100.0 12/31/2019 0826     Coagulation Profile: No results for input(s): "INR", "PROTIME" in the last 168 hours.  Cardiac Enzymes: No results for input(s): "CKTOTAL", "CKMB", "CKMBINDEX", "TROPONINI" in the last 168 hours.  HbA1C: Hgb A1c MFr Bld  Date/Time Value Ref Range Status  09/29/2018 03:23 PM 4.8 4.8 - 5.6 % Final    Comment:             Prediabetes: 5.7 - 6.4          Diabetes: >6.4          Glycemic control for adults with diabetes: <7.0     CBG: Recent Labs  Lab 02/16/22 1633 02/16/22 2317 02/17/22 0327 02/17/22 0744  GLUCAP 99 102* 102* 126*    Review of Systems:   Unable to assess   Past Medical History:  She,  has a past medical history of [redacted] weeks gestation of pregnancy (03/22/2020), Alcohol abuse, Alcohol-induced acute pancreatitis, Anxiety, Elevated LFTs (10/24/2018), Hepatic encephalopathy ([place]) (12/24/2019),  Hypertension, and Liver disease.   Surgical History:   Past Surgical History:  Procedure Laterality Date   ECTOPIC PREGNANCY SURGERY     ESOPHAGOGASTRODUODENOSCOPY (EGD) WITH PROPOFOL N/A 12/27/2019   Procedure: ESOPHAGOGASTRODUODENOSCOPY (EGD) WITH PROPOFOL;  Surgeon: Carol Ada, MD;  Location: [place];  Service: Endoscopy;  Laterality: N/A;     Social History:   reports that she has never smoked. She has never used smokeless tobacco. She reports current alcohol use. She reports that she does not use drugs.   Family History:  Her family history includes Alcohol abuse in her father and mother; Diabetes in her father; Healthy in her brother, brother, brother, and mother; Hypertension in her maternal grandfather and maternal grandmother.   Allergies Allergies  Allergen Reactions   Latex  Hives    Patient stated that she is allergic to latex     Home Medications  Prior to Admission medications   Medication Sig Start Date End Date Taking? Authorizing Provider  folic acid (FOLVITE) 1 MG tablet Take 1 tablet (1 mg total) by mouth daily. 12/22/21  Yes France Ravens, MD  lactulose [facility]) 10 GM/15ML solution Take 20 g by mouth daily as needed for mild constipation.   Yes [provider]  Multiple Vitamin (MULTIVITAMIN WITH MINERALS) TABS tablet Take 1 tablet by mouth daily. 01/15/20  Yes Angiulli, Lavon Paganini, PA-C  pantoprazole (PROTONIX) 40 MG tablet Take 1 tablet (40 mg total) by mouth 2 (two) times daily. 12/22/21  Yes Maxwell, Allee, MD  potassium chloride SA (KLOR-CON M) 20 MEQ tablet Take 2 tablets (40 mEq total) by mouth daily. 12/23/21 02/15/22 Yes France Ravens, MD  rifaximin (XIFAXAN) 550 MG TABS tablet Take 1 tablet (550 mg total) by mouth 2 (two) times daily. 12/22/21  Yes Erskine Emery, MD  traZODone (DESYREL) 150 MG tablet Take 150 mg by mouth at bedtime. 01/11/22  Yes [provider]  vitamin B-12 (CYANOCOBALAMIN) 100 MCG tablet Take 100 mcg by mouth daily.   Yes  [provider]  zinc sulfate 220 (50 Zn) MG capsule Take 1 capsule (220 mg total) by mouth daily. 01/15/20  Yes Angiulli, Lavon Paganini, PA-C     Critical care time: n/a  Lanier Clam, MD [facility] Personal contact information can be found on Amion  02/17/2022, 8:18 AM

## 2022-02-17 NOTE — Progress Notes (Signed)
PROGRESS NOTE    Joann [name]  YBO:[id number] DOB: 1981-05-25 DOA: 02/15/2022 PCP: Pcp, No     Brief Narrative:   alcohol abuse, alcoholic cirrhosis who was seen yesterday in the [facility], ER.  Apparently she was discharged.  She presented to [facility], ER.  Reportedly prior to triage she was agitated and altered.  She has been having vomiting since yesterday.   Subjective:  Remain very agitated and confused, on restraints, currently not fully awake, maintaining air way Ammonia remain elevated, not able to take oral, getting lactulose enema Hgb low, plt low, currently no sign of external bleed   Assessment & Plan:  Principal Problem:   Acute, mixed level of activity, alcohol withdrawal delirium (HCC) Active Problems:   Alcoholic ketoacidosis   Thrombocytopenia (HCC)   Hypokalemia   Elevated bilirubin   Alcoholic cirrhosis (HCC)    Assessment and Plan:  Acute toxic and metabolic encephalopathy in the setting of alcohol withdrawal and possible hepatic encephalopathy --Family report patient has a history of alcohol withdrawal seizure requiring intubation -Continue CIWA protocol, IV thiamine - agitated and  minimally awake, but able to maintain airway, may need ng inserting for meds/nutrition  -Continue lactulose enema -Critical care consulted, input appreciated    Fever Spike fever on 6/9 am Check ua, cxr, blood culture   Hypokalemia/hypomagnesemia IV replacement while n.p.o.  Alcoholic cirrhosis with history of esophageal varices.  Last EGD appears to be in April 2021.  Continue with IV Protonix.  Does not appear to be bleeding.  Abnormal LFT  From alcohol intoxication and cirrhosis  Trend LFT     Normocytic anemia Thrombocytopenia  -No overt sign of bleeding, likely from alcohol use and cirrhosis -Monitor CBC   Alcohol abuse Long history Mother report patient desire to go to rehab facility at discharge   I have Reviewed nursing notes, Vitals, pain  scores, I/o's, Lab results and  imaging results since pt's last encounter, details please see discussion above  I ordered the following labs:  Unresulted Labs (From admission, onward)     Start     Ordered   02/17/22 0712  Culture, blood (Routine X 2) w Reflex to ID Panel  BLOOD CULTURE X 2,   R (with TIMED occurrences)      02/17/22 0711   02/17/22 0500  Comprehensive metabolic panel  Daily,   R      02/16/22 0919   02/17/22 0500  Ammonia  Daily,   R      02/16/22 0919   Unscheduled  Occult blood card to lab, stool  As needed,   R      02/16/22 0916             DVT prophylaxis: SCDs Start: 02/16/22 0325   Code Status:   Code Status: Full Code  Family Communication: Mother updated over the phone on 6/9 Disposition:    Dispo: The patient is from: Home              Anticipated d/c is to: 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, sports, 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 Mother reports patient decided to go to rehab facility after discharge               Anticipated d/c date is: Patient currently is critically ill  Antimicrobials:    Anti-infectives (From admission, onward)    None          Objective: Vitals:   02/17/22 1300  02/17/22 1334 02/17/22 1400 02/17/22 1439  BP: 97/64 128/78 115/73 (!) 147/82  Pulse: (!) 101 (!) 129 (!) 110 (!) 133  Resp: (!) 24  (!) 30 (!) 28  Temp:    99.1 F (37.3 C)  TempSrc:    Axillary  SpO2: 100%  100% 100%  Weight:      Height:        Intake/Output Summary (Last 24 hours) at 02/17/2022 1506 Last data filed at 02/17/2022 1439 Gross per 24 hour  Intake 3948.01 ml  Output 3600 ml  Net 348.01 ml   Filed Weights   02/15/22 1507 02/17/22 0440  Weight: 73 kg 73.4 kg    Examination:  General exam: Opens eyes briefly to voice, not following command Respiratory system: Clear to auscultation. Respiratory effort normal. Cardiovascular system: Sinus tachycardia Gastrointestinal system: Abdomen is nondistended, soft and nontender.   Normal bowel sounds heard. Central nervous system: Opens eyes briefly to voice, not following command Extremities:  no edema Skin: No rashes, lesions or ulcers Psychiatry: Currently no agitation    Data Reviewed: I have personally reviewed  labs and visualized  imaging studies since the last encounter and formulate the plan        Scheduled Meds:  chlorhexidine  15 mL Mouth Rinse BID   Chlorhexidine Gluconate Cloth  6 each Topical [id number]   folic acid  1 mg Oral Daily   LORazepam  0-4 mg Intravenous Q12H   Or   LORazepam  0-4 mg Oral Q12H   mouth rinse  15 mL Mouth Rinse q12n4p   multivitamin with minerals  1 tablet Oral Daily   pantoprazole (PROTONIX) IV  40 mg Intravenous Q12H   phenobarbital  97.2 mg Per Tube Q8H   Followed by   [START ON 02/19/2022] phenobarbital  64.8 mg Per Tube Q8H   Followed by   [START ON 02/21/2022] phenobarbital  32.4 mg Per Tube Q8H   sodium chloride flush  10-40 mL Intracatheter Q12H   Continuous Infusions:  dextrose 5% lactated ringers Stopped (02/17/22 1000)   lactated ringers 1,000 mL with potassium chloride 40 mEq infusion 100 mL/hr at 02/17/22 1200   PHENObarbital     potassium chloride Stopped (02/17/22 1438)   thiamine injection 500 mg (02/17/22 1433)     LOS: 2 days     Florencia Reasons, MD PhD FACP [facility]  Available via Epic secure chat 7am-7pm for nonurgent issues Please page for urgent issues To page the attending provider between 7A-7P or the covering provider during after hours 7P-7A, please log into the web site [email or web address] and access using universal [place] password for that web site. If you do not have the password, please call the hospital operator.    02/17/2022, 3:06 PM

## 2022-02-18 DIAGNOSIS — F10231 Alcohol dependence with withdrawal delirium: Secondary | ICD-10-CM | POA: Diagnosis not present

## 2022-02-18 LAB — CBC
HCT: 27.7 % — ABNORMAL LOW (ref 36.0–46.0)
Hemoglobin: 7.9 g/dL — ABNORMAL LOW (ref 12.0–15.0)
MCH: 25.7 pg — ABNORMAL LOW (ref 26.0–34.0)
MCHC: 28.5 g/dL — ABNORMAL LOW (ref 30.0–36.0)
MCV: 90.2 fL (ref 80.0–100.0)
Platelets: 34 10*3/uL — ABNORMAL LOW (ref 150–400)
RBC: 3.07 MIL/uL — ABNORMAL LOW (ref 3.87–5.11)
RDW: 20 % — ABNORMAL HIGH (ref 11.5–15.5)
WBC: 2.6 10*3/uL — ABNORMAL LOW (ref 4.0–10.5)
nRBC: 0 % (ref 0.0–0.2)

## 2022-02-18 LAB — COMPREHENSIVE METABOLIC PANEL
ALT: 22 U/L (ref 0–44)
AST: 182 U/L — ABNORMAL HIGH (ref 15–41)
Albumin: 2.8 g/dL — ABNORMAL LOW (ref 3.5–5.0)
Alkaline Phosphatase: 98 U/L (ref 38–126)
Anion gap: 6 (ref 5–15)
BUN: <5 mg/dL — ABNORMAL LOW (ref 6–20)
CO2: 20 mmol/L — ABNORMAL LOW (ref 22–32)
Calcium: 7.7 mg/dL — ABNORMAL LOW (ref 8.9–10.3)
Chloride: 119 mmol/L — ABNORMAL HIGH (ref 98–111)
Creatinine, Ser: 0.56 mg/dL (ref 0.44–1.00)
GFR, Estimated: >60 mL/min (ref >60)
Glucose, Bld: 97 mg/dL (ref 70–99)
Potassium: 2.8 mmol/L — ABNORMAL LOW (ref 3.5–5.1)
Sodium: 145 mmol/L (ref 135–145)
Total Bilirubin: 2.1 mg/dL — ABNORMAL HIGH (ref 0.3–1.2)
Total Protein: 7.5 g/dL (ref 6.5–8.1)

## 2022-02-18 LAB — GLUCOSE, CAPILLARY
Glucose-Capillary: 100 mg/dL — ABNORMAL HIGH (ref 70–99)
Glucose-Capillary: 102 mg/dL — ABNORMAL HIGH (ref 70–99)
Glucose-Capillary: 109 mg/dL — ABNORMAL HIGH (ref 70–99)
Glucose-Capillary: 80 mg/dL (ref 70–99)
Glucose-Capillary: 80 mg/dL (ref 70–99)
Glucose-Capillary: 83 mg/dL (ref 70–99)
Glucose-Capillary: 97 mg/dL (ref 70–99)

## 2022-02-18 LAB — MAGNESIUM: Magnesium: 1.4 mg/dL — ABNORMAL LOW (ref 1.7–2.4)

## 2022-02-18 LAB — AMMONIA: Ammonia: 69 umol/L — ABNORMAL HIGH (ref 9–35)

## 2022-02-18 LAB — PHOSPHORUS: Phosphorus: 1.3 mg/dL — ABNORMAL LOW (ref 2.5–4.6)

## 2022-02-18 MED ORDER — POTASSIUM PHOSPHATES 15 MMOLE/5ML IV SOLN
45.0000 mmol | Freq: Once | INTRAVENOUS | Status: AC
  Administered 2022-02-18: 45 mmol via INTRAVENOUS
  Filled 2022-02-18: qty 15

## 2022-02-18 MED ORDER — MAGNESIUM SULFATE 4 GM/100ML IV SOLN
4.0000 g | Freq: Once | INTRAVENOUS | Status: AC
  Administered 2022-02-18: 4 g via INTRAVENOUS
  Filled 2022-02-18: qty 100

## 2022-02-18 MED ORDER — POTASSIUM CHLORIDE 20 MEQ PO PACK
40.0000 meq | PACK | ORAL | Status: AC
  Administered 2022-02-18 (×2): 40 meq
  Filled 2022-02-18 (×2): qty 2

## 2022-02-18 NOTE — Progress Notes (Signed)
NAME:  Joann [name], MRN:  [id number], DOB:  Aug 22, 1981, LOS: 3 ADMISSION DATE:  02/15/2022, CONSULTATION DATE:  02/16/2022 REFERRING MD:  Dr. Erlinda Hong - [facility], CHIEF COMPLAINT:  ETOH withdrawal    History of Present Illness:  Joann [name] is a 41 y.o. female with a PMH significant for ETOH use, alcohol induced pancreatitis, hepatic encephalopathy, HTN, and anxiety who presented to the ED 6/8 for nausea and vomiting with later development of agitation and AMS.   While in ED patient became progressively agitated requiring application of 4 point restraints. She was seen tachycardic, with all other vitals within normal limits. CIWA protocol was initiated for acute alcohol withdrawal and patient was admitted per [facility].   By morning of 6/9 patient was seen with change in mentation (less arousable) with observed diaphoresis and tremors prompting move to ICU and PCCM consult   Pertinent  Medical History  ETOH use, alcohol induced pancreatitis, hepatic encephalopathy, HTN, and anxiety  Significant Hospital Events: Including procedures, antibiotic start and stop dates in addition to other pertinent events   6/8 Presented with nausea and vomiting with later development of agitation and AMS.  6/9 Worsening withdrawal prompting move to ICU and PCCM consult   Interim History / Subjective:  Phenobarbital started yesterday. More agitated overnight. Ativan 16 mg total given.   Objective   Blood pressure (!) 131/93, pulse (!) 111, temperature (!) 97.5 F (36.4 C), temperature source Axillary, resp. rate 19, height 5' 3"  (1.6 m), weight 74 kg, SpO2 100 %.        Intake/Output Summary (Last 24 hours) at 02/18/2022 0903 Last data filed at 02/18/2022 0700 Gross per 24 hour  Intake 3756.47 ml  Output 5025 ml  Net -1268.53 ml    Filed Weights   02/15/22 1507 02/17/22 0440 02/18/22 0407  Weight: 73 kg 73.4 kg 74 kg    Examination: General: Acute ill appearing adult female lying in bed, in NAD HEENT: [place]/AT, MM  pink/moist, PERRL,  Neuro: Encephalopathic, unable to follow any commands, sponaneously seen moving all extremities, responds to verbal questions with one word answers CV: s1s2 regular rate and rhythm, no murmur, rubs, or gallops,  PULM:  Clear to ascultation, no increased work of breathing, no added breath sounds  GI: soft, bowel sounds active in all 4 quadrants, non-tender, non-distended, tolerating TF Extremities: warm/dry, no edema  Skin: no rashes or lesions   Resolved Hospital Problem list     Assessment & Plan:  Toxic metabolic encephalopathy: In setting of alcohol withdrawal, benzodiazepine and antipsychotic administration.  Possible hepatic encephalopathy. --Lactulose via NG BID, increase as needed goal stool output 500 cc daily, Rifaximin BID   Alcohol withdrawal: Tremulous, combative, disoriented prior to arrival to the ICU.  Treated with IV benzodiazepines.  Now resting comfortably.  Will arouse. --Continue CIWA, Ativan as needed --Phenobarbital taper   Bilirubinemia: Likely in the setting of liver damage from alcohol, cirrhosis. --Supportive measures, avoid hepatotoxins   Best Practice (right click and "Reselect all SmartList Selections" daily)   Diet/type: NPO DVT prophylaxis: SCD GI prophylaxis: PPI Lines: N/A Foley:  N/A Code Status:  full code Last date of multidisciplinary goals of care discussion: Updated family on arrival to ICU  Labs   CBC: Recent Labs  Lab 02/14/22 1215 02/15/22 1705 02/16/22 0500 02/16/22 0945 02/16/22 1634 02/17/22 0740 02/18/22 0013  WBC 4.1 8.2 5.4  --   --  2.4* 2.6*  NEUTROABS 2.5 6.7 3.7  --   --   --   --  HGB 11.2* 9.9* 7.7* 8.1* 9.0* 7.4* 7.9*  HCT 37.6 32.8* 25.6* 27.4* 30.2* 25.8* 27.7*  MCV 86.2 85.4 88.0  --   --  90.2 90.2  PLT 67* 84* 43*  --   --  37* 34*     Basic Metabolic Panel: Recent Labs  Lab 02/14/22 1215 02/15/22 1705 02/16/22 0500 02/17/22 0740 02/18/22 0013  NA 143 142 143 144 145  K 2.8*  2.8* 3.3* 2.6* 2.8*  CL 108 100 111 117* 119*  CO2 17* 14* 23 20* 20*  GLUCOSE 114* 129* 107* 134* 97  BUN <5* 10 9 <5* <5*  CREATININE 0.67 1.10* 0.67 0.59 0.56  CALCIUM 8.3* 8.6* 7.4* 7.8* 7.7*  MG  --   --  1.3* 1.6* 1.4*  PHOS  --   --   --   --  1.3*    GFR: Estimated Creatinine Clearance: 88.2 mL/min (by C-G formula based on SCr of 0.56 mg/dL). Recent Labs  Lab 02/15/22 1705 02/16/22 0500 02/17/22 0740 02/18/22 0013  WBC 8.2 5.4 2.4* 2.6*     Liver Function Tests: Recent Labs  Lab 02/14/22 1215 02/15/22 1705 02/16/22 0500 02/17/22 0740 02/18/22 0013  AST 133* <5* 202* 184* 182*  ALT 17 <5 21 23 22   ALKPHOS 170* 151* 102 96 98  BILITOT 2.0* 5.0* 3.3* 2.6* 2.1*  PROT 10.3* 10.1* 7.6 7.1 7.5  ALBUMIN 3.8 4.1 3.1* 2.9* 2.8*    Recent Labs  Lab 02/16/22 0949  LIPASE 35  AMYLASE 167*    Recent Labs  Lab 02/15/22 2347 02/17/22 0740 02/18/22 0003  AMMONIA 140* 92* 69*     ABG    Component Value Date/Time   PHART 7.448 12/31/2019 0826   PCO2ART 38.2 12/31/2019 0826   PO2ART 346 (H) 12/31/2019 0826   HCO3 26.3 12/31/2019 0826   TCO2 27 12/31/2019 0826   ACIDBASEDEF 2.5 (H) 12/30/2019 1655   O2SAT 100.0 12/31/2019 0826     Coagulation Profile: No results for input(s): "INR", "PROTIME" in the last 168 hours.  Cardiac Enzymes: No results for input(s): "CKTOTAL", "CKMB", "CKMBINDEX", "TROPONINI" in the last 168 hours.  HbA1C: Hgb A1c MFr Bld  Date/Time Value Ref Range Status  09/29/2018 03:23 PM 4.8 4.8 - 5.6 % Final    Comment:             Prediabetes: 5.7 - 6.4          Diabetes: >6.4          Glycemic control for adults with diabetes: <7.0     CBG: Recent Labs  Lab 02/17/22 1551 02/17/22 2047 02/17/22 2353 02/18/22 0337 02/18/22 0736  GLUCAP 79 97 102* 97 100*     Review of Systems:   Unable to assess   Past Medical History:  She,  has a past medical history of [redacted] weeks gestation of pregnancy (03/22/2020), Alcohol abuse,  Alcohol-induced acute pancreatitis, Anxiety, Elevated LFTs (10/24/2018), Hepatic encephalopathy ([place]) (12/24/2019), Hypertension, and Liver disease.   Surgical History:   Past Surgical History:  Procedure Laterality Date   ECTOPIC PREGNANCY SURGERY     ESOPHAGOGASTRODUODENOSCOPY (EGD) WITH PROPOFOL N/A 12/27/2019   Procedure: ESOPHAGOGASTRODUODENOSCOPY (EGD) WITH PROPOFOL;  Surgeon: Carol Ada, MD;  Location: [place];  Service: Endoscopy;  Laterality: N/A;     Social History:   reports that she has never smoked. She has never used smokeless tobacco. She reports current alcohol use. She reports that she does not use drugs.   Family History:  Her family history includes Alcohol abuse in her father and mother; Diabetes in her father; Healthy in her brother, brother, brother, and mother; Hypertension in her maternal grandfather and maternal grandmother.   Allergies Allergies  Allergen Reactions   Latex Hives    Patient stated that she is allergic to latex     Home Medications  Prior to Admission medications   Medication Sig Start Date End Date Taking? Authorizing Provider  folic acid (FOLVITE) 1 MG tablet Take 1 tablet (1 mg total) by mouth daily. 12/22/21  Yes France Ravens, MD  lactulose [facility]) 10 GM/15ML solution Take 20 g by mouth daily as needed for mild constipation.   Yes [provider]  Multiple Vitamin (MULTIVITAMIN WITH MINERALS) TABS tablet Take 1 tablet by mouth daily. 01/15/20  Yes Angiulli, Lavon Paganini, PA-C  pantoprazole (PROTONIX) 40 MG tablet Take 1 tablet (40 mg total) by mouth 2 (two) times daily. 12/22/21  Yes Maxwell, Allee, MD  potassium chloride SA (KLOR-CON M) 20 MEQ tablet Take 2 tablets (40 mEq total) by mouth daily. 12/23/21 02/15/22 Yes France Ravens, MD  rifaximin (XIFAXAN) 550 MG TABS tablet Take 1 tablet (550 mg total) by mouth 2 (two) times daily. 12/22/21  Yes Erskine Emery, MD  traZODone (DESYREL) 150 MG tablet Take 150 mg by mouth at bedtime. 01/11/22   Yes [provider]  vitamin B-12 (CYANOCOBALAMIN) 100 MCG tablet Take 100 mcg by mouth daily.   Yes [provider]  zinc sulfate 220 (50 Zn) MG capsule Take 1 capsule (220 mg total) by mouth daily. 01/15/20  Yes Angiulli, Lavon Paganini, PA-C     Critical care time: n/a  Lanier Clam, MD [place] Personal contact information can be found on Amion  02/18/2022, 9:03 AM

## 2022-02-18 NOTE — Progress Notes (Signed)
PROGRESS NOTE    Joann [name]  YKD:[id number] DOB: [birth year] DOA: 02/15/2022 PCP: Pcp, No     Brief Narrative:   alcohol abuse, alcoholic cirrhosis who was seen yesterday in the [facility] ER.  Apparently she was discharged.  She presented to [facility], ER.  Reportedly prior to triage she was agitated and altered.  She has been having vomiting since yesterday.   Subjective:  Ng placed for lactulose and meds Vital signs are improving Fever appears has resolved  She is slightly more awake today, still in 4poits restrains    Assessment & Plan:  Principal Problem:   Acute, mixed level of activity, alcohol withdrawal delirium (HCC) Active Problems:   Alcoholic ketoacidosis   Thrombocytopenia (HCC)   Hypokalemia   Elevated bilirubin   Alcoholic cirrhosis (HCC)    Assessment and Plan:  Acute toxic and metabolic encephalopathy in the setting of alcohol withdrawal and possible hepatic encephalopathy --Family report patient has a history of alcohol withdrawal seizure requiring intubation -Continue CIWA protocol, IV thiamine, was briefly on Precedex drip, now on phenobarbital protocol - ng placed on 6/10, lactulose per NG now --Critical care  input appreciated    Fever Spike fever on 6/10 am  ua, cxr unremarkable  blood culture no growth Empiric started Rocephin on 6/10  Hypokalemia/hypomagnesemia/hypophosphatemia Replace and recheck  Alcoholic cirrhosis with history of esophageal varices.  Last EGD appears to be in April 2021.  Continue with IV Protonix.  Does not appear to be bleeding.  Abnormal LFT  From alcohol intoxication and cirrhosis  Trend LFT     Normocytic anemia Thrombocytopenia  -No overt sign of bleeding, likely from alcohol use and cirrhosis -Monitor CBC   Alcohol abuse Long history Mother report patient desire to go to rehab facility at discharge   I have Reviewed nursing notes, Vitals, pain scores, I/o's, Lab results and  imaging results  since pt's last encounter, details please see discussion above  I ordered the following labs:  Unresulted Labs (From admission, onward)     Start     Ordered   02/19/22 0500  Magnesium  Tomorrow morning,   R       Question:  Specimen collection method  Answer:  Unit=Unit collect   02/18/22 0742   02/18/22 2000  Phosphorus  (Serum Phosphorus)  Once-Timed,   TIMED       Question:  Specimen collection method  Answer:  Unit=Unit collect   02/18/22 0239   02/17/22 0500  Comprehensive metabolic panel  Daily,   R      02/16/22 0919   02/17/22 0500  Ammonia  Daily,   R      02/16/22 0919   Unscheduled  Occult blood card to lab, stool  As needed,   R      02/16/22 0916             DVT prophylaxis: SCDs Start: 02/16/22 0325   Code Status:   Code Status: Full Code  Family Communication: Mother updated over the phone on 6/9 Disposition:    Dispo: The patient is from: Home              Anticipated d/c is to: 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, sports, 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 Mother reports patient decided to go to rehab facility after discharge               Anticipated d/c date is: Patient currently is critically ill  Antimicrobials:    Anti-infectives (From admission, onward)    Start     Dose/Rate Route Frequency Ordered Stop   02/17/22 2200  rifaximin (XIFAXAN) tablet 550 mg        550 mg Per Tube 2 times daily 02/17/22 1828     02/17/22 1830  cefTRIAXone (ROCEPHIN) 1 g in sodium chloride 0.9 % 100 mL IVPB        1 g 200 mL/hr over 30 Minutes Intravenous Every 24 hours 02/17/22 1742            Objective: Vitals:   02/18/22 1620 02/18/22 1700 02/18/22 1800 02/18/22 1900  BP:  94/73 106/79   Pulse: (!) 114 (!) 105 (!) 116   Resp: (!) 26 (!) 22    Temp: 99 F (37.2 C)   99.4 F (37.4 C)  TempSrc: Axillary   Axillary  SpO2: 100% 100%    Weight:      Height:        Intake/Output Summary (Last 24 hours) at 02/18/2022 1923 Last data filed at 02/18/2022  1828 Gross per 24 hour  Intake 3099.44 ml  Output 3125 ml  Net -25.56 ml   Filed Weights   02/15/22 1507 02/17/22 0440 02/18/22 0407  Weight: 73 kg 73.4 kg 74 kg    Examination:  General exam: Opens eyes briefly to voice, not following command Respiratory system: Clear to auscultation. Respiratory effort normal. Cardiovascular system: Sinus tachycardia Gastrointestinal system: Abdomen is nondistended, soft and nontender.  Normal bowel sounds heard. Central nervous system: Opens eyes briefly to voice, not following command Extremities:  no edema Skin: No rashes, lesions or ulcers Psychiatry: Currently no agitation    Data Reviewed: I have personally reviewed  labs and visualized  imaging studies since the last encounter and formulate the plan        Scheduled Meds:  chlorhexidine  15 mL Mouth Rinse BID   Chlorhexidine Gluconate Cloth  6 each Topical B2841   folic acid  1 mg Oral Daily   lactulose  20 g Per Tube BID   LORazepam  0-4 mg Intravenous Q12H   Or   LORazepam  0-4 mg Oral Q12H   mouth rinse  15 mL Mouth Rinse q12n4p   multivitamin with minerals  1 tablet Oral Daily   pantoprazole (PROTONIX) IV  40 mg Intravenous Q12H   phenobarbital  97.2 mg Per Tube Q8H   Followed by   [START ON 02/19/2022] phenobarbital  64.8 mg Per Tube Q8H   Followed by   [START ON 02/21/2022] phenobarbital  32.4 mg Per Tube Q8H   rifaximin  550 mg Per Tube BID   sodium chloride flush  10-40 mL Intracatheter Q12H   Continuous Infusions:  cefTRIAXone (ROCEPHIN)  IV Stopped (02/18/22 1755)   lactated ringers 1,000 mL with potassium chloride 40 mEq infusion 100 mL/hr at 02/18/22 1828   thiamine injection Stopped (02/18/22 1439)     LOS: 3 days     Florencia Reasons, MD PhD FACP [facility]  Available via Epic secure chat 7am-7pm for nonurgent issues Please page for urgent issues To page the attending provider between 7A-7P or the covering provider during after hours 7P-7A, please  log into the web site [email or web address] and access using universal Cool password for that web site. If you do not have the password, please call the hospital operator.    02/18/2022, 7:23 PM

## 2022-02-18 NOTE — Progress Notes (Signed)
[facility] Progress Note Patient Name: Joann [name] DOB: [birth year] MRN: [id number]   Date of Service  02/18/2022  HPI/Events of Note  Patient is on CIWA protocol with Ativan + Phenobarbitone, K+ 2.8, Mg++ 1.4, Phos 1.3, creat: normal.  eICU Interventions  Electrolytes replaced per protocol.        Kerry Kass Girolamo Lortie 02/18/2022, 2:39 AM

## 2022-02-19 DIAGNOSIS — E875 Hyperkalemia: Secondary | ICD-10-CM

## 2022-02-19 DIAGNOSIS — F10231 Alcohol dependence with withdrawal delirium: Secondary | ICD-10-CM | POA: Diagnosis not present

## 2022-02-19 DIAGNOSIS — R17 Unspecified jaundice: Secondary | ICD-10-CM | POA: Diagnosis not present

## 2022-02-19 DIAGNOSIS — G312 Degeneration of nervous system due to alcohol: Secondary | ICD-10-CM

## 2022-02-19 DIAGNOSIS — F10931 Alcohol use, unspecified with withdrawal delirium: Secondary | ICD-10-CM | POA: Diagnosis not present

## 2022-02-19 LAB — CBC
HCT: 33.4 % — ABNORMAL LOW (ref 36.0–46.0)
Hemoglobin: 9 g/dL — ABNORMAL LOW (ref 12.0–15.0)
MCH: 25.8 pg — ABNORMAL LOW (ref 26.0–34.0)
MCHC: 26.9 g/dL — ABNORMAL LOW (ref 30.0–36.0)
MCV: 95.7 fL (ref 80.0–100.0)
Platelets: 45 10*3/uL — ABNORMAL LOW (ref 150–400)
RBC: 3.49 MIL/uL — ABNORMAL LOW (ref 3.87–5.11)
RDW: 20.8 % — ABNORMAL HIGH (ref 11.5–15.5)
WBC: 3 10*3/uL — ABNORMAL LOW (ref 4.0–10.5)
nRBC: 0 % (ref 0.0–0.2)

## 2022-02-19 LAB — COMPREHENSIVE METABOLIC PANEL
ALT: 27 U/L (ref 0–44)
AST: 189 U/L — ABNORMAL HIGH (ref 15–41)
Albumin: 3.2 g/dL — ABNORMAL LOW (ref 3.5–5.0)
Alkaline Phosphatase: 120 U/L (ref 38–126)
Anion gap: 8 (ref 5–15)
BUN: <5 mg/dL — ABNORMAL LOW (ref 6–20)
CO2: 19 mmol/L — ABNORMAL LOW (ref 22–32)
Calcium: 8.6 mg/dL — ABNORMAL LOW (ref 8.9–10.3)
Chloride: 118 mmol/L — ABNORMAL HIGH (ref 98–111)
Creatinine, Ser: 0.63 mg/dL (ref 0.44–1.00)
GFR, Estimated: >60 mL/min (ref >60)
Glucose, Bld: 79 mg/dL (ref 70–99)
Potassium: 5.7 mmol/L — ABNORMAL HIGH (ref 3.5–5.1)
Sodium: 145 mmol/L (ref 135–145)
Total Bilirubin: 2.1 mg/dL — ABNORMAL HIGH (ref 0.3–1.2)
Total Protein: 8.4 g/dL — ABNORMAL HIGH (ref 6.5–8.1)

## 2022-02-19 LAB — BASIC METABOLIC PANEL
Anion gap: 8 (ref 5–15)
BUN: 6 mg/dL (ref 6–20)
CO2: 19 mmol/L — ABNORMAL LOW (ref 22–32)
Calcium: 8.5 mg/dL — ABNORMAL LOW (ref 8.9–10.3)
Chloride: 121 mmol/L — ABNORMAL HIGH (ref 98–111)
Creatinine, Ser: 0.65 mg/dL (ref 0.44–1.00)
GFR, Estimated: >60 mL/min (ref >60)
Glucose, Bld: 89 mg/dL (ref 70–99)
Potassium: 3.9 mmol/L (ref 3.5–5.1)
Sodium: 148 mmol/L — ABNORMAL HIGH (ref 135–145)

## 2022-02-19 LAB — MAGNESIUM
Magnesium: 2 mg/dL (ref 1.7–2.4)
Magnesium: 1.8 mg/dL (ref 1.7–2.4)
Magnesium: 2.5 mg/dL — ABNORMAL HIGH (ref 1.7–2.4)

## 2022-02-19 LAB — PREPARE PLATELET PHERESIS: Unit division: 0

## 2022-02-19 LAB — PHOSPHORUS
Phosphorus: 2.4 mg/dL — ABNORMAL LOW (ref 2.5–4.6)
Phosphorus: 2.6 mg/dL (ref 2.5–4.6)
Phosphorus: 3.4 mg/dL (ref 2.5–4.6)

## 2022-02-19 LAB — GLUCOSE, CAPILLARY
Glucose-Capillary: 66 mg/dL — ABNORMAL LOW (ref 70–99)
Glucose-Capillary: 73 mg/dL (ref 70–99)
Glucose-Capillary: 79 mg/dL (ref 70–99)
Glucose-Capillary: 80 mg/dL (ref 70–99)
Glucose-Capillary: 82 mg/dL (ref 70–99)
Glucose-Capillary: 93 mg/dL (ref 70–99)
Glucose-Capillary: 97 mg/dL (ref 70–99)

## 2022-02-19 LAB — BPAM PLATELET PHERESIS
Blood Product Expiration Date: 202306122359
ISSUE DATE / TIME: 202306101205
Unit Type and Rh: 6200

## 2022-02-19 LAB — AMMONIA: Ammonia: 80 umol/L — ABNORMAL HIGH (ref 9–35)

## 2022-02-19 LAB — POTASSIUM
Potassium: 4.1 mmol/L (ref 3.5–5.1)
Potassium: 3.6 mmol/L (ref 3.5–5.1)

## 2022-02-19 LAB — LACTIC ACID, PLASMA: Lactic Acid, Venous: 1 mmol/L (ref 0.5–1.9)

## 2022-02-19 MED ORDER — PHENYLEPHRINE HCL-NACL 20-0.9 MG/250ML-% IV SOLN: 25.0000 ug/min | INTRAVENOUS | Status: DC

## 2022-02-19 MED ORDER — OSMOLITE 1.2 CAL PO LIQD
1000.0000 mL | ORAL | Status: DC
Start: 2022-02-19 — End: 2022-02-23
  Administered 2022-02-19 – 2022-02-23 (×7): 1000 mL

## 2022-02-19 MED ORDER — NOREPINEPHRINE 4 MG/250ML-% IV SOLN
2.0000 ug/min | INTRAVENOUS | Status: DC
  Administered 2022-02-19: 2 ug/min via INTRAVENOUS
  Administered 2022-02-20: 4 ug/min via INTRAVENOUS
  Administered 2022-02-21: 2 ug/min via INTRAVENOUS
  Filled 2022-02-19 (×3): qty 250

## 2022-02-19 MED ORDER — SODIUM CHLORIDE 0.9 % IV BOLUS
500.0000 mL | Freq: Once | INTRAVENOUS | Status: AC
  Administered 2022-02-19: 500 mL via INTRAVENOUS

## 2022-02-19 MED ORDER — HALOPERIDOL LACTATE 5 MG/ML IJ SOLN
1.0000 mg | Freq: Once | INTRAMUSCULAR | Status: AC
  Administered 2022-02-20: 1 mg via INTRAVENOUS
  Filled 2022-02-19: qty 1

## 2022-02-19 MED ORDER — ADULT MULTIVITAMIN W/MINERALS CH
1.0000 | ORAL_TABLET | Freq: Every day | ORAL | Status: DC
  Administered 2022-02-19 – 2022-02-23 (×5): 1
  Filled 2022-02-19 (×4): qty 1

## 2022-02-19 MED ORDER — DEXMEDETOMIDINE HCL IN NACL 200 MCG/50ML IV SOLN
0.4000 ug/kg/h | INTRAVENOUS | Status: DC
  Administered 2022-02-19: 0.4 ug/kg/h via INTRAVENOUS
  Filled 2022-02-19: qty 50

## 2022-02-19 MED ORDER — SODIUM CHLORIDE 0.45 % IV SOLN: INTRAVENOUS | Status: DC

## 2022-02-19 MED ORDER — THIAMINE HCL 100 MG PO TABS
100.0000 mg | ORAL_TABLET | Freq: Every day | ORAL | Status: DC
  Administered 2022-02-20 – 2022-02-22 (×3): 100 mg
  Filled 2022-02-19 (×3): qty 1

## 2022-02-19 MED ORDER — VITAL HIGH PROTEIN PO LIQD: 1000.0000 mL | ORAL | Status: DC

## 2022-02-19 MED ORDER — FOLIC ACID 1 MG PO TABS
1.0000 mg | ORAL_TABLET | Freq: Every day | ORAL | Status: DC
  Administered 2022-02-19 – 2022-02-23 (×5): 1 mg
  Filled 2022-02-19 (×4): qty 1

## 2022-02-19 MED ORDER — PHENYLEPHRINE HCL-NACL 20-0.9 MG/250ML-% IV SOLN
25.0000 ug/min | INTRAVENOUS | Status: DC
  Administered 2022-02-19: 25 ug/min via INTRAVENOUS
  Filled 2022-02-19: qty 250

## 2022-02-19 MED ORDER — ATROPINE SULFATE 1 MG/10ML IJ SOSY
PREFILLED_SYRINGE | INTRAMUSCULAR | Status: AC
  Filled 2022-02-19: qty 10

## 2022-02-19 MED ORDER — MAGNESIUM SULFATE 2 GM/50ML IV SOLN
2.0000 g | Freq: Once | INTRAVENOUS | Status: AC
  Administered 2022-02-19: 2 g via INTRAVENOUS
  Filled 2022-02-19: qty 50

## 2022-02-19 MED ORDER — PROSOURCE TF PO LIQD
45.0000 mL | Freq: Two times a day (BID) | ORAL | Status: DC
  Administered 2022-02-19 – 2022-02-22 (×8): 45 mL
  Filled 2022-02-19 (×8): qty 45

## 2022-02-19 MED ORDER — SODIUM ZIRCONIUM CYCLOSILICATE 10 G PO PACK
10.0000 g | PACK | Freq: Once | ORAL | Status: AC
  Administered 2022-02-19: 10 g
  Filled 2022-02-19: qty 1

## 2022-02-19 MED ORDER — SODIUM CHLORIDE 0.9 % IV SOLN
250.0000 mL | INTRAVENOUS | Status: DC
  Administered 2022-02-19 – 2022-02-21 (×2): 250 mL via INTRAVENOUS

## 2022-02-19 NOTE — Progress Notes (Signed)
Initial Nutrition Assessment  DOCUMENTATION CODES:   Not applicable  INTERVENTION:  - will order TF regimen: Osmolite 1.2 @ 20 ml/hr to advance by 10 ml every 12 hours to reach goal rate of 60 ml/hr with 45 ml Prosource TF BID.  - at goal rate, this regimen will provide 1808 kcal, 102 grams protein, and 1181 ml free water.  - monitor magnesium, potassium, and phosphorus BID for at least 3 days, MD to replete as needed, as pt is at risk for refeeding syndrome given hx of alcohol abuse.    NUTRITION DIAGNOSIS:   Inadequate oral intake related to inability to eat as evidenced by NPO status.  GOAL:   Patient will meet greater than or equal to 90% of their needs  MONITOR:   TF tolerance, Diet advancement, Labs, Weight trends  REASON FOR ASSESSMENT:   Consult Enteral/tube feeding initiation and management  ASSESSMENT:   41 year old female with medical history of alcohol abuse, alcoholic cirrhosis, anxiety, HTN, and alcohol-induced pancreatitis. She presented to the ED due to agitation, altered mental status, and vomiting x1 day. In the ED she was in four-point restraints. She was started on CIWA protocol. She was unable to give any history in the ED. She was admitted for alcohol withdrawal and related delirium.  Patient discussed during rounds this AM and one-on-one with RN. Patient was ordered Regular diet on 6/8 at 1530 and changed to NPO that date at 50 and has been NPO since that time.   She is noted to be a/o to place only and has been yelling out consistently throughout the morning. No visitors seen at bedside at this time.   Small bore NGT placed in R nare on 6/10 evening (tip overlying the duodenal bulb per abdominal x-ray on 6/10).   She was last seen by a [place] RD on 12/19/21 during admission for GI bleed and profound anemia.   Weight today is 152 lb and weight on 6/8 was 161 lb. Weight on 01/11/22 was 160 lb. This would indicate 8 lb weight loss (5% body weight)  in the past 1 month. No information documented in the edema section of flow sheet this hospitalization.   Labs reviewed; CBGs: 66, 79, 73, and 80 mg/dl, Cl: 118 mmol/l, Ca: 8.6 mg/dl, AST elevated and slightly up from 6/11.  Medications reviewed; 1 mg folvite/day, 20 g lactulose per tube BID, 1 tablet multivitamin with minerals per tube/day, 40 mg IV protonix BID, 40 mEq Klor-Con x2 doses 6/11, 10 g lokelma x1 dose 6/12, 500 mg IV thiamine TID.    NUTRITION - FOCUSED PHYSICAL EXAM:  Deferred.   Diet Order:   Diet Order             Diet NPO time specified  Diet effective now                   EDUCATION NEEDS:   No education needs have been identified at this time  Skin:  Skin Assessment: Reviewed RN Assessment  Last BM:  6/11 (type 7 x1, large amount)  Height:   Ht Readings from Last 1 Encounters:  02/15/22 5' 3"  (1.6 m)    Weight:   Wt Readings from Last 1 Encounters:  02/19/22 69.3 kg     BMI:  Body mass index is 27.06 kg/m.  Estimated Nutritional Needs:  Kcal:  1750-2000 kcal Protein:  90-105 grams Fluid:  >/= 2 L/day     Jarome Matin, MS, RD, LDN Registered Dietitian II  Inpatient Clinical Nutrition RD pager # and on-call/weekend pager # available in [facility]

## 2022-02-19 NOTE — TOC Progression Note (Signed)
Transition of Care [facility]) - Progression Note   Patient Details  Name: TIAJUANA LEPPANEN MRN: [id number] Date of Birth: [birth year]  Transition of Care [facility]) CM/SW [place], LCSW Phone Number: 02/19/2022, 10:35 AM  Clinical Narrative: [facility] consulted for substance use resources, but patient remains oriented x1. Consult to be completed when orientation improves. TOC to follow.  Expected Discharge Plan: Home/Self Care Barriers to Discharge: Continued Medical Work up  Expected Discharge Plan and Services Expected Discharge Plan: Home/Self Care In-house Referral: Clinical Social Work Discharge Planning Services: CM Consult Post Acute Care Choice: NA Living arrangements for the past 2 months: Group Home            DME Arranged: N/A DME Agency: NA  Readmission Risk Interventions    02/19/2022   10:32 AM  Readmission Risk Prevention Plan  HRI or Home Care Consult Complete  Social Work Consult for [place] Planning/Counseling Complete  Palliative Care Screening Not Applicable  Medication Review Press photographer) Complete

## 2022-02-19 NOTE — Progress Notes (Signed)
HYPERKALEMIA Patient presented with hyperkalemia (K 5.7).  He was on IV infusion of LR with KCl (40 mEq).  This was stopped. EKG was ordered to ensure no EKG changes IV NS bolus 500 mL was given, Lokelma was given. Continue to monitor potassium level with serial BMPs  Total time:  10 minutes This includes time reviewing the chart including progress notes, labs, EKGs, taking medical decisions, ordering labs and documenting findings.

## 2022-02-19 NOTE — Progress Notes (Signed)
eLink Physician-Brief Progress Note Patient Name: Joann [name] DOB: [birth year] MRN: [id number]   Date of Service  02/19/2022  HPI/Events of Note  Agitation - QTc interval = 0.481 seconds.  eICU Interventions  Plan: Haldol 1 mg IV now.      Intervention Category Major Interventions: Delirium, psychosis, severe agitation - evaluation and management  [name] Eugene 02/19/2022, 10:23 PM

## 2022-02-19 NOTE — Progress Notes (Addendum)
eLink Physician-Brief Progress Note Patient Name: Joann [name] DOB: [birth year] MRN: [id number]   Date of Service  02/19/2022  HPI/Events of Note  Hypernatremia - Na+ = 148. Free water loss with Lactulose/diarrhea? K+ = 3.9.  eICU Interventions  Plan: 0.45 NaCl IV infusion at 75 mL/hour.  Continue to trend Na+.     Intervention Category Major Interventions: Electrolyte abnormality - evaluation and management  Lysle Dingwall 02/19/2022, 7:58 PM

## 2022-02-19 NOTE — Progress Notes (Signed)
Patient is currently under [facility] service and PCCM has been following as a consult. Patient is now requiring Precedex drip for management of agitation. PCCM will take over as the primary team. If patient is able to come off Precedex drip by tomorrow morning, [facility] will take over Discussed with critical care Dr. Chase Caller. [facility] will not bill for today.

## 2022-02-19 NOTE — Progress Notes (Signed)
NAME:  Joann [name], MRN:  [id number], DOB:  1981-08-05, LOS: 4 ADMISSION DATE:  02/15/2022, CONSULTATION DATE:  02/16/2022 REFERRING MD:  Dr. Erlinda Hong - [facility], CHIEF COMPLAINT:  ETOH withdrawal    History of Present Illness:  Joann [name] is a 41 y.o. female with a PMH significant for ETOH use, alcohol induced pancreatitis, hepatic encephalopathy, HTN, and anxiety who presented to the ED 6/8 for nausea and vomiting with later development of agitation and AMS.   While in ED patient became progressively agitated requiring application of 4 point restraints. She was seen tachycardic, with all other vitals within normal limits. CIWA protocol was initiated for acute alcohol withdrawal and patient was admitted per [facility].   By morning of 6/9 patient was seen with change in mentation (less arousable) with observed diaphoresis and tremors prompting move to ICU and PCCM consult   Pertinent  Medical History  ETOH use, alcohol induced pancreatitis, hepatic encephalopathy, HTN, and anxiety  Significant Hospital Events: Including procedures, antibiotic start and stop dates in addition to other pertinent events   6/8 Presented with nausea and vomiting with later development of agitation and AMS.  6/9 Worsening withdrawal prompting move to ICU and PCCM consult  6/12 stable remains on precedex and ativan, hyperkalemic overnight and lokelma ordered  Interim History / Subjective:  Phenobarbital started yesterday. More agitated overnight. Ativan 16 mg total given.   Objective   Blood pressure 120/76, pulse (!) 105, temperature 98.2 F (36.8 C), temperature source Oral, resp. rate (!) 21, height 5' 3"  (1.6 m), weight 69.3 kg, SpO2 100 %.        Intake/Output Summary (Last 24 hours) at 02/19/2022 3295 Last data filed at 02/19/2022 1884 Gross per 24 hour  Intake 2801.53 ml  Output 2820 ml  Net -18.47 ml    Filed Weights   02/17/22 0440 02/18/22 0407 02/19/22 0500  Weight: 73.4 kg 74 kg 69.3 kg     General:   ill-appearing, well-nourished F, lying in bed in NAD HEENT: MM pink/moist, sclera anicteric, pupils equal, NGT in place Neuro: somnolent, opens eyes to voice, not following commands, spontaneously moving extremities CV: s1s2 rrr, no m/r/g PULM:  clear bilaterally  GI: soft, bsx4 active, non-distended Extremities: warm/dry, no edema  Skin: no rashes or lesions    Labs reviewed Mag 2.0,  ammonia 80,  K 5.7 Bili 2.1   Resolved Hospital Problem list     Assessment & Plan:   Acute encephalopathy likely metabolic and secondary ETOH withdrawal and  benzodiazepine/antipsychotic administration.  Possible hepatic encephalopathy contributing. -CTH without acute findings -continue Lactulose via NG BID, increase as needed goal stool output 500 cc daily, and Rifaximin BID -start TF   Alcohol withdrawal  Hypokalemia, hypomagnesemia, hypophosphatemia  Tremulous, combative, disoriented prior to arrival to the ICU.  Treated with IV benzodiazepines.  Now resting comfortably.  Will arouse. --Continue CIWA, Ativan as needed --continue Phenobarbital taper -continue thiamine and folic acid  -still requiring ativan q2hrs, wean as able  -replete electrolytes as needed    Bilirubinemia Alcoholic Cirrhosis  Likely in the setting of liver damage from alcohol, cirrhosis. -improving and LFT's stable, continue supportive measures, avoid hepatotoxins   Best Practice (right click and "Reselect all SmartList Selections" daily)   Diet/type: NPO, start TF  DVT prophylaxis: SCD GI prophylaxis: PPI Lines: N/A Foley:  N/A Code Status:  full code Last date of multidisciplinary goals of care discussion: per primary team  Labs   CBC: Recent Labs  Lab  02/14/22 1215 02/15/22 1705 02/16/22 0500 02/16/22 0945 02/16/22 1634 02/17/22 0740 02/18/22 0013  WBC 4.1 8.2 5.4  --   --  2.4* 2.6*  NEUTROABS 2.5 6.7 3.7  --   --   --   --   HGB 11.2* 9.9* 7.7* 8.1* 9.0* 7.4* 7.9*  HCT 37.6 32.8* 25.6*  27.4* 30.2* 25.8* 27.7*  MCV 86.2 85.4 88.0  --   --  90.2 90.2  PLT 67* 84* 43*  --   --  37* 34*     Basic Metabolic Panel: Recent Labs  Lab 02/15/22 1705 02/16/22 0500 02/17/22 0740 02/18/22 0013 02/18/22 1958 02/19/22 0305  NA 142 143 144 145  --  145  K 2.8* 3.3* 2.6* 2.8*  --  5.7*  CL 100 111 117* 119*  --  118*  CO2 14* 23 20* 20*  --  19*  GLUCOSE 129* 107* 134* 97  --  79  BUN 10 9 <5* <5*  --  <5*  CREATININE 1.10* 0.67 0.59 0.56  --  0.63  CALCIUM 8.6* 7.4* 7.8* 7.7*  --  8.6*  MG  --  1.3* 1.6* 1.4*  --  2.0  PHOS  --   --   --  1.3* 2.4*  --     GFR: Estimated Creatinine Clearance: 85.6 mL/min (by C-G formula based on SCr of 0.63 mg/dL). Recent Labs  Lab 02/15/22 1705 02/16/22 0500 02/17/22 0740 02/18/22 0013  WBC 8.2 5.4 2.4* 2.6*     Liver Function Tests: Recent Labs  Lab 02/15/22 1705 02/16/22 0500 02/17/22 0740 02/18/22 0013 02/19/22 0305  AST <5* 202* 184* 182* 189*  ALT <5 21 23 22 27   ALKPHOS 151* 102 96 98 120  BILITOT 5.0* 3.3* 2.6* 2.1* 2.1*  PROT 10.1* 7.6 7.1 7.5 8.4*  ALBUMIN 4.1 3.1* 2.9* 2.8* 3.2*    Recent Labs  Lab 02/16/22 0949  LIPASE 35  AMYLASE 167*    Recent Labs  Lab 02/15/22 2347 02/17/22 0740 02/18/22 0003 02/19/22 0305  AMMONIA 140* 92* 69* 80*     ABG    Component Value Date/Time   PHART 7.448 12/31/2019 0826   PCO2ART 38.2 12/31/2019 0826   PO2ART 346 (H) 12/31/2019 0826   HCO3 26.3 12/31/2019 0826   TCO2 27 12/31/2019 0826   ACIDBASEDEF 2.5 (H) 12/30/2019 1655   O2SAT 100.0 12/31/2019 0826     Coagulation Profile: No results for input(s): "INR", "PROTIME" in the last 168 hours.  Cardiac Enzymes: No results for input(s): "CKTOTAL", "CKMB", "CKMBINDEX", "TROPONINI" in the last 168 hours.  HbA1C: Hgb A1c MFr Bld  Date/Time Value Ref Range Status  09/29/2018 03:23 PM 4.8 4.8 - 5.6 % Final    Comment:             Prediabetes: 5.7 - 6.4          Diabetes: >6.4          Glycemic  control for adults with diabetes: <7.0     CBG: Recent Labs  Lab 02/18/22 1925 02/18/22 2328 02/19/22 0334 02/19/22 0336 02/19/22 0807  GLUCAP 80 80 66* 79 73     Review of Systems:   Unable to assess   Past Medical History:  She,  has a past medical history of [redacted] weeks gestation of pregnancy (03/22/2020), Alcohol abuse, Alcohol-induced acute pancreatitis, Anxiety, Elevated LFTs (10/24/2018), Hepatic encephalopathy ([place]) (12/24/2019), Hypertension, and Liver disease.   Surgical History:   Past Surgical History:  Procedure Laterality  Date   ECTOPIC PREGNANCY SURGERY     ESOPHAGOGASTRODUODENOSCOPY (EGD) WITH PROPOFOL N/A 12/27/2019   Procedure: ESOPHAGOGASTRODUODENOSCOPY (EGD) WITH PROPOFOL;  Surgeon: Carol Ada, MD;  Location: [facility];  Service: Endoscopy;  Laterality: N/A;     Social History:   reports that she has never smoked. She has never used smokeless tobacco. She reports current alcohol use. She reports that she does not use drugs.   Family History:  Her family history includes Alcohol abuse in her father and mother; Diabetes in her father; Healthy in her brother, brother, brother, and mother; Hypertension in her maternal grandfather and maternal grandmother.   Allergies Allergies  Allergen Reactions   Latex Hives    Patient stated that she is allergic to latex     Home Medications  Prior to Admission medications   Medication Sig Start Date End Date Taking? Authorizing Provider  folic acid (FOLVITE) 1 MG tablet Take 1 tablet (1 mg total) by mouth daily. 12/22/21  Yes France Ravens, MD  lactulose [facility]) 10 GM/15ML solution Take 20 g by mouth daily as needed for mild constipation.   Yes [provider]  Multiple Vitamin (MULTIVITAMIN WITH MINERALS) TABS tablet Take 1 tablet by mouth daily. 01/15/20  Yes Angiulli, Lavon Paganini, PA-C  pantoprazole (PROTONIX) 40 MG tablet Take 1 tablet (40 mg total) by mouth 2 (two) times daily. 12/22/21  Yes Maxwell, Allee,  MD  potassium chloride SA (KLOR-CON M) 20 MEQ tablet Take 2 tablets (40 mEq total) by mouth daily. 12/23/21 02/15/22 Yes France Ravens, MD  rifaximin (XIFAXAN) 550 MG TABS tablet Take 1 tablet (550 mg total) by mouth 2 (two) times daily. 12/22/21  Yes Erskine Emery, MD  traZODone (DESYREL) 150 MG tablet Take 150 mg by mouth at bedtime. 01/11/22  Yes [provider]  vitamin B-12 (CYANOCOBALAMIN) 100 MCG tablet Take 100 mcg by mouth daily.   Yes [provider]  zinc sulfate 220 (50 Zn) MG capsule Take 1 capsule (220 mg total) by mouth daily. 01/15/20  Yes Angiulli, Lavon Paganini, PA-C     Critical care time: 40 minutes    Otilio Carpen Slayton Lubitz, PA-C [facility] See Amion for pager If no response to pager , please call [phone] until 7pm After 7:00 pm call Elink  [phone]?[place]

## 2022-02-20 DIAGNOSIS — G9340 Encephalopathy, unspecified: Secondary | ICD-10-CM | POA: Diagnosis present

## 2022-02-20 DIAGNOSIS — R17 Unspecified jaundice: Secondary | ICD-10-CM | POA: Diagnosis not present

## 2022-02-20 DIAGNOSIS — F10931 Alcohol use, unspecified with withdrawal delirium: Secondary | ICD-10-CM | POA: Diagnosis not present

## 2022-02-20 LAB — CBC WITH DIFFERENTIAL/PLATELET
Abs Immature Granulocytes: 0.01 10*3/uL (ref 0.00–0.07)
Basophils Absolute: 0 10*3/uL (ref 0.0–0.1)
Basophils Relative: 1 %
Eosinophils Absolute: 0.1 10*3/uL (ref 0.0–0.5)
Eosinophils Relative: 2 %
HCT: 30.3 % — ABNORMAL LOW (ref 36.0–46.0)
Hemoglobin: 8.4 g/dL — ABNORMAL LOW (ref 12.0–15.0)
Immature Granulocytes: 0 %
Lymphocytes Relative: 47 %
Lymphs Abs: 1.6 10*3/uL (ref 0.7–4.0)
MCH: 25.6 pg — ABNORMAL LOW (ref 26.0–34.0)
MCHC: 27.7 g/dL — ABNORMAL LOW (ref 30.0–36.0)
MCV: 92.4 fL (ref 80.0–100.0)
Monocytes Absolute: 0.5 10*3/uL (ref 0.1–1.0)
Monocytes Relative: 14 %
Neutro Abs: 1.2 10*3/uL — ABNORMAL LOW (ref 1.7–7.7)
Neutrophils Relative %: 36 %
Platelets: 48 10*3/uL — ABNORMAL LOW (ref 150–400)
RBC: 3.28 MIL/uL — ABNORMAL LOW (ref 3.87–5.11)
RDW: 20.5 % — ABNORMAL HIGH (ref 11.5–15.5)
WBC: 3.4 10*3/uL — ABNORMAL LOW (ref 4.0–10.5)
nRBC: 0 % (ref 0.0–0.2)

## 2022-02-20 LAB — COMPREHENSIVE METABOLIC PANEL
ALT: 22 U/L (ref 0–44)
AST: 123 U/L — ABNORMAL HIGH (ref 15–41)
Albumin: 3.1 g/dL — ABNORMAL LOW (ref 3.5–5.0)
Alkaline Phosphatase: 114 U/L (ref 38–126)
Anion gap: 3 — ABNORMAL LOW (ref 5–15)
BUN: 7 mg/dL (ref 6–20)
CO2: 22 mmol/L (ref 22–32)
Calcium: 8.4 mg/dL — ABNORMAL LOW (ref 8.9–10.3)
Chloride: 122 mmol/L — ABNORMAL HIGH (ref 98–111)
Creatinine, Ser: 0.7 mg/dL (ref 0.44–1.00)
GFR, Estimated: >60 mL/min (ref >60)
Glucose, Bld: 102 mg/dL — ABNORMAL HIGH (ref 70–99)
Potassium: 3.5 mmol/L (ref 3.5–5.1)
Sodium: 147 mmol/L — ABNORMAL HIGH (ref 135–145)
Total Bilirubin: 1.7 mg/dL — ABNORMAL HIGH (ref 0.3–1.2)
Total Protein: 7.9 g/dL (ref 6.5–8.1)

## 2022-02-20 LAB — BASIC METABOLIC PANEL
Anion gap: 6 (ref 5–15)
BUN: 6 mg/dL (ref 6–20)
CO2: 20 mmol/L — ABNORMAL LOW (ref 22–32)
Calcium: 8 mg/dL — ABNORMAL LOW (ref 8.9–10.3)
Chloride: 117 mmol/L — ABNORMAL HIGH (ref 98–111)
Creatinine, Ser: 0.55 mg/dL (ref 0.44–1.00)
GFR, Estimated: >60 mL/min (ref >60)
Glucose, Bld: 116 mg/dL — ABNORMAL HIGH (ref 70–99)
Potassium: 3.5 mmol/L (ref 3.5–5.1)
Sodium: 143 mmol/L (ref 135–145)

## 2022-02-20 LAB — PHOSPHORUS
Phosphorus: 2.4 mg/dL — ABNORMAL LOW (ref 2.5–4.6)
Phosphorus: 4.5 mg/dL (ref 2.5–4.6)

## 2022-02-20 LAB — HEPATITIS PANEL, ACUTE
HCV Ab: NONREACTIVE
Hep A IgM: NONREACTIVE
Hep B C IgM: NONREACTIVE
Hepatitis B Surface Ag: NONREACTIVE

## 2022-02-20 LAB — GLUCOSE, CAPILLARY
Glucose-Capillary: 101 mg/dL — ABNORMAL HIGH (ref 70–99)
Glucose-Capillary: 102 mg/dL — ABNORMAL HIGH (ref 70–99)
Glucose-Capillary: 106 mg/dL — ABNORMAL HIGH (ref 70–99)
Glucose-Capillary: 113 mg/dL — ABNORMAL HIGH (ref 70–99)
Glucose-Capillary: 114 mg/dL — ABNORMAL HIGH (ref 70–99)
Glucose-Capillary: 121 mg/dL — ABNORMAL HIGH (ref 70–99)

## 2022-02-20 LAB — AMMONIA: Ammonia: 69 umol/L — ABNORMAL HIGH (ref 9–35)

## 2022-02-20 LAB — MAGNESIUM
Magnesium: 1.9 mg/dL (ref 1.7–2.4)
Magnesium: 1.8 mg/dL (ref 1.7–2.4)

## 2022-02-20 LAB — LACTATE DEHYDROGENASE: LDH: 218 U/L — ABNORMAL HIGH (ref 98–192)

## 2022-02-20 LAB — PROTIME-INR
INR: 1.5 — ABNORMAL HIGH (ref 0.8–1.2)
Prothrombin Time: 18.2 seconds — ABNORMAL HIGH (ref 11.4–15.2)

## 2022-02-20 LAB — PATHOLOGIST SMEAR REVIEW

## 2022-02-20 MED ORDER — LACTULOSE 10 GM/15ML PO SOLN
30.0000 g | Freq: Three times a day (TID) | ORAL | Status: DC
Start: 2022-02-20 — End: 2022-02-22
  Administered 2022-02-20 – 2022-02-22 (×7): 30 g
  Filled 2022-02-20 (×7): qty 45

## 2022-02-20 MED ORDER — POTASSIUM PHOSPHATES 15 MMOLE/5ML IV SOLN
30.0000 mmol | Freq: Once | INTRAVENOUS | Status: AC
  Administered 2022-02-20: 30 mmol via INTRAVENOUS
  Filled 2022-02-20: qty 10

## 2022-02-20 MED ORDER — DEXMEDETOMIDINE HCL IN NACL 200 MCG/50ML IV SOLN
INTRAVENOUS | Status: AC
  Administered 2022-02-20: 0.4 ug/kg/h via INTRAVENOUS
  Filled 2022-02-20: qty 50

## 2022-02-20 MED ORDER — DEXMEDETOMIDINE HCL IN NACL 200 MCG/50ML IV SOLN
0.1000 ug/kg/h | INTRAVENOUS | Status: DC
  Administered 2022-02-20: 0.3 ug/kg/h via INTRAVENOUS
  Administered 2022-02-21 (×2): 0.5 ug/kg/h via INTRAVENOUS
  Administered 2022-02-21 (×3): 0.6 ug/kg/h via INTRAVENOUS
  Administered 2022-02-22: 0.5 ug/kg/h via INTRAVENOUS
  Administered 2022-02-22: 0.6 ug/kg/h via INTRAVENOUS
  Filled 2022-02-20 (×8): qty 50

## 2022-02-20 MED ORDER — LORAZEPAM 1 MG PO TABS: 1.0000 mg | ORAL_TABLET | ORAL | Status: AC | PRN

## 2022-02-20 MED ORDER — LORAZEPAM 2 MG/ML IJ SOLN
1.0000 mg | INTRAMUSCULAR | Status: AC | PRN
  Administered 2022-02-20: 4 mg via INTRAVENOUS
  Administered 2022-02-21 (×2): 2 mg via INTRAVENOUS
  Administered 2022-02-21 (×2): 4 mg via INTRAVENOUS
  Administered 2022-02-22: 2 mg via INTRAVENOUS
  Administered 2022-02-22: 1 mg via INTRAVENOUS
  Administered 2022-02-22: 4 mg via INTRAVENOUS
  Filled 2022-02-20: qty 2
  Filled 2022-02-20 (×2): qty 1
  Filled 2022-02-20 (×4): qty 2
  Filled 2022-02-20: qty 1

## 2022-02-20 MED ORDER — LORAZEPAM 2 MG/ML IJ SOLN
1.0000 mg | INTRAMUSCULAR | Status: DC | PRN
  Administered 2022-02-20 (×4): 1 mg via INTRAVENOUS
  Filled 2022-02-20 (×4): qty 1

## 2022-02-20 MED ORDER — MAGNESIUM SULFATE 2 GM/50ML IV SOLN
2.0000 g | Freq: Once | INTRAVENOUS | Status: AC
  Administered 2022-02-20: 2 g via INTRAVENOUS
  Filled 2022-02-20: qty 50

## 2022-02-20 NOTE — Progress Notes (Signed)
[facility] ADULT ICU REPLACEMENT PROTOCOL   The patient does apply for the [facility] Adult ICU Electrolyte Replacment Protocol based on the criteria listed below:   1.Exclusion criteria: TCTS patients, ECMO patients, and Dialysis patients 2. Is GFR >/= 30 ml/min? Yes.    Patient's GFR today is >60 3. Is SCr </= 2? Yes.   Patient's SCr is 0.70 mg/dL 4. Did SCr increase >/= 0.5 in 24 hours? No. 5.Pt's weight >40kg  Yes.   6. Abnormal electrolyte(s): Mag 1.9  7. Electrolytes replaced per protocol 8.  Call MD STAT for K+ </= 2.5, Phos </= 1, or Mag </= 1 Physician:  Randie Heinz 02/20/2022 4:10 AM

## 2022-02-20 NOTE — Progress Notes (Signed)
PCCM interval progress:  Despite increasing Ativan and continued phenobarbital taper, pt had increasing agitation throughout the day and required Precedex and peripheral levophed.  Also noted that peripheral smear had schistocytes,  Bili is improving, but will check haptoglobin, LDH and Adams-13.  Discussed with Dr. Chase Caller consider heme/onc consult tomorrow.   Joann Carpen Quamel Fitzmaurice, PA-C

## 2022-02-20 NOTE — Progress Notes (Signed)
NAME:  Joann [name], MRN:  [id number], DOB:  13-Feb-1981, LOS: 5 ADMISSION DATE:  02/15/2022, CONSULTATION DATE:  02/16/2022 REFERRING MD:  Dr. Erlinda Hong - [facility], CHIEF COMPLAINT:  ETOH withdrawal    History of Present Illness:  Joann [name] is a 41 y.o. female with a PMH significant for ETOH use, alcohol induced pancreatitis, hepatic encephalopathy, HTN, and anxiety who presented to the ED 6/8 for nausea and vomiting with later development of agitation and AMS.   While in ED patient became progressively agitated requiring application of 4 point restraints. She was seen tachycardic, with all other vitals within normal limits. CIWA protocol was initiated for acute alcohol withdrawal and patient was admitted per [facility].   By morning of 6/9 patient was seen with change in mentation (less arousable) with observed diaphoresis and tremors prompting move to ICU and PCCM consult   Pertinent  Medical History  ETOH use, alcohol induced pancreatitis, hepatic encephalopathy, HTN, and anxiety  Significant Hospital Events: Including procedures, antibiotic start and stop dates in addition to other pertinent events   6/8 Presented with nausea and vomiting with later development of agitation and AMS.  6/9 Worsening withdrawal prompting move to ICU and PCCM consult  6/12 stable remains on phenobarbital and ativan, hyperkalemic overnight and lokelma ordered 6/13 agitated yesterday evening and started on precedex which led to hypotension, peripheral neo started and pt became bradycardic.  Changed to Levophed, Haldol overnight.  On prophylactic ceftriaxone  Interim History / Subjective:  Issues with continued agitation overnight, off precedex and pressors this morning, initially somnolent.  Lactic acid WNL, not suggestive of developing infection though remains leukopenic and thrombocytopenic   Objective   Blood pressure (!) 110/58, pulse (!) 114, temperature (!) 96.7 F (35.9 C), temperature source Axillary, resp. rate (!) 24,  height 5' 3"  (1.6 m), weight 69.3 kg, SpO2 100 %.        Intake/Output Summary (Last 24 hours) at 02/20/2022 0744 Last data filed at 02/20/2022 0600 Gross per 24 hour  Intake 1404.62 ml  Output 750 ml  Net 654.62 ml    Filed Weights   02/17/22 0440 02/18/22 0407 02/19/22 0500  Weight: 73.4 kg 74 kg 69.3 kg   General:  ill-appearing F, sleeping, arousable, no current distress HEENT: MM pink/moist, NGT in place Neuro: somnolent, examined after Ativan, arouses to voice and falls back asleep CV: s1s2 rrr, no m/r/g PULM:  mildly decreased air entry bilateral bases, no rhonchi or wheezing, no tachypnea or distress GI: soft, non-tender, non-distended Extremities: warm/dry, no edema  Skin: no rashes or lesions     Labs reviewed WBC 3.4 Hgb 8.4 Platelets 48 Na 147 Bili 1.7 Ammonia 69   Resolved Hospital Problem list     Assessment & Plan:   Acute encephalopathy likely metabolic and secondary ETOH withdrawal and  benzodiazepine/antipsychotic administration and hepatic encephalopathy CTH without acute findings -continue Lactulose via NG BID, increase as needed goal stool output 500 cc daily, and Rifaximin BID -difficult to manage agitation refractory to phenobarbital and ativan, intermittently on Precedex and got Haldol overnight   Alcohol withdrawal  Hypokalemia, hypomagnesemia, hypophosphatemia  Tremulous, combative, disoriented prior to arrival to the ICU.  Continued agitation in the ICU despite phenobarbital taper and CIWA  -blood pressure and HR dropped with Precedex yesterday requiring Neo, discontinued, but then again agitated overnight and was given Haldol.  -somnolent this AM, if requires Precedex again will start peripheral Levophed -Continue CIWA, Ativan as needed --continue Phenobarbital taper -continue thiamine  and folic acid  -still requiring ativan q2hrs, wean as able  -replete electrolytes as needed   -lactic normal, afebrile, do not think has worsening  infection  Bilirubinemia Alcoholic Cirrhosis  Thrombocytopenia Leukopenia Likely in the setting of ETOH abuse  RUQ [place] 2 months ago with findings of cirrhosis and probable portal HTN -check acute hepatitis panel -continue SBP prophylaxis with Ceftriaxone -check peripheral smear and INR -improving and LFT's stable, continue supportive measures, avoid hepatotoxins   Best Practice (right click and "Reselect all SmartList Selections" daily)   Diet/type: tubefeeds,  DVT prophylaxis: SCD GI prophylaxis: PPI Lines: N/A Foley:  N/A Code Status:  full code Last date of multidisciplinary goals of care discussion: will attempt to call spouse today  Labs   CBC: Recent Labs  Lab 02/14/22 1215 02/15/22 1705 02/16/22 0500 02/16/22 0945 02/16/22 1634 02/17/22 0740 02/18/22 0013 02/19/22 1910 02/20/22 0246  WBC 4.1 8.2 5.4  --   --  2.4* 2.6* 3.0* 3.4*  NEUTROABS 2.5 6.7 3.7  --   --   --   --   --  1.2*  HGB 11.2* 9.9* 7.7*   < > 9.0* 7.4* 7.9* 9.0* 8.4*  HCT 37.6 32.8* 25.6*   < > 30.2* 25.8* 27.7* 33.4* 30.3*  MCV 86.2 85.4 88.0  --   --  90.2 90.2 95.7 92.4  PLT 67* 84* 43*  --   --  37* 34* 45* 48*   < > = values in this interval not displayed.     Basic Metabolic Panel: Recent Labs  Lab 02/17/22 0740 02/18/22 0013 02/18/22 1958 02/19/22 0305 02/19/22 1007 02/19/22 1337 02/19/22 1643 02/19/22 1910 02/20/22 0246  NA 144 145  --  145  --   --   --  148* 147*  K 2.6* 2.8*  --  5.7* 4.1 3.6  --  3.9 3.5  CL 117* 119*  --  118*  --   --   --  121* 122*  CO2 20* 20*  --  19*  --   --   --  19* 22  GLUCOSE 134* 97  --  79  --   --   --  89 102*  BUN <5* <5*  --  <5*  --   --   --  6 7  CREATININE 0.59 0.56  --  0.63  --   --   --  0.65 0.70  CALCIUM 7.8* 7.7*  --  8.6*  --   --   --  8.5* 8.4*  MG 1.6* 1.4*  --  2.0 1.8  --  2.5*  --  1.9  PHOS  --  1.3* 2.4*  --  2.6  --  3.4  --  2.4*    GFR: Estimated Creatinine Clearance: 85.6 mL/min (by C-G formula based on  SCr of 0.7 mg/dL). Recent Labs  Lab 02/17/22 0740 02/18/22 0013 02/19/22 1910 02/20/22 0246  WBC 2.4* 2.6* 3.0* 3.4*  LATICACIDVEN  --   --  1.0  --      Liver Function Tests: Recent Labs  Lab 02/16/22 0500 02/17/22 0740 02/18/22 0013 02/19/22 0305 02/20/22 0246  AST 202* 184* 182* 189* 123*  ALT 21 23 22 27 22   ALKPHOS 102 96 98 120 114  BILITOT 3.3* 2.6* 2.1* 2.1* 1.7*  PROT 7.6 7.1 7.5 8.4* 7.9  ALBUMIN 3.1* 2.9* 2.8* 3.2* 3.1*    Recent Labs  Lab 02/16/22 0949  LIPASE 35  AMYLASE 167*  Recent Labs  Lab 02/15/22 2347 02/17/22 0740 02/18/22 0003 02/19/22 0305 02/20/22 0246  AMMONIA 140* 92* 69* 80* 69*     ABG    Component Value Date/Time   PHART 7.448 12/31/2019 0826   PCO2ART 38.2 12/31/2019 0826   PO2ART 346 (H) 12/31/2019 0826   HCO3 26.3 12/31/2019 0826   TCO2 27 12/31/2019 0826   ACIDBASEDEF 2.5 (H) 12/30/2019 1655   O2SAT 100.0 12/31/2019 0826     Coagulation Profile: No results for input(s): "INR", "PROTIME" in the last 168 hours.  Cardiac Enzymes: No results for input(s): "CKTOTAL", "CKMB", "CKMBINDEX", "TROPONINI" in the last 168 hours.  HbA1C: Hgb A1c MFr Bld  Date/Time Value Ref Range Status  09/29/2018 03:23 PM 4.8 4.8 - 5.6 % Final    Comment:             Prediabetes: 5.7 - 6.4          Diabetes: >6.4          Glycemic control for adults with diabetes: <7.0     CBG: Recent Labs  Lab 02/19/22 1125 02/19/22 1527 02/19/22 1935 02/19/22 2355 02/20/22 0410  GLUCAP 80 82 97 93 102*     Review of Systems:   Unable to assess   Past Medical History:  She,  has a past medical history of [redacted] weeks gestation of pregnancy (03/22/2020), Alcohol abuse, Alcohol-induced acute pancreatitis, Anxiety, Elevated LFTs (10/24/2018), Hepatic encephalopathy ([place]) (12/24/2019), Hypertension, and Liver disease.   Surgical History:   Past Surgical History:  Procedure Laterality Date   ECTOPIC PREGNANCY SURGERY      ESOPHAGOGASTRODUODENOSCOPY (EGD) WITH PROPOFOL N/A 12/27/2019   Procedure: ESOPHAGOGASTRODUODENOSCOPY (EGD) WITH PROPOFOL;  Surgeon: Carol Ada, MD;  Location: [facility];  Service: Endoscopy;  Laterality: N/A;     Social History:   reports that she has never smoked. She has never used smokeless tobacco. She reports current alcohol use. She reports that she does not use drugs.   Family History:  Her family history includes Alcohol abuse in her father and mother; Diabetes in her father; Healthy in her brother, brother, brother, and mother; Hypertension in her maternal grandfather and maternal grandmother.   Allergies Allergies  Allergen Reactions   Latex Hives    Patient stated that she is allergic to latex     Home Medications  Prior to Admission medications   Medication Sig Start Date End Date Taking? Authorizing Provider  folic acid (FOLVITE) 1 MG tablet Take 1 tablet (1 mg total) by mouth daily. 12/22/21  Yes France Ravens, MD  lactulose [facility]) 10 GM/15ML solution Take 20 g by mouth daily as needed for mild constipation.   Yes [provider]  Multiple Vitamin (MULTIVITAMIN WITH MINERALS) TABS tablet Take 1 tablet by mouth daily. 01/15/20  Yes Angiulli, Lavon Paganini, PA-C  pantoprazole (PROTONIX) 40 MG tablet Take 1 tablet (40 mg total) by mouth 2 (two) times daily. 12/22/21  Yes Maxwell, Allee, MD  potassium chloride SA (KLOR-CON M) 20 MEQ tablet Take 2 tablets (40 mEq total) by mouth daily. 12/23/21 02/15/22 Yes France Ravens, MD  rifaximin (XIFAXAN) 550 MG TABS tablet Take 1 tablet (550 mg total) by mouth 2 (two) times daily. 12/22/21  Yes Erskine Emery, MD  traZODone (DESYREL) 150 MG tablet Take 150 mg by mouth at bedtime. 01/11/22  Yes [provider]  vitamin B-12 (CYANOCOBALAMIN) 100 MCG tablet Take 100 mcg by mouth daily.   Yes [provider]  zinc sulfate 220 (50 Zn)  MG capsule Take 1 capsule (220 mg total) by mouth daily. 01/15/20  Yes Angiulli, Lavon Paganini, PA-C      Critical care time: 35 minutes    CRITICAL CARE Performed by: Otilio Carpen Katerra Ingman   Total critical care time: 35 minutes  Critical care time was exclusive of separately billable procedures and treating other patients.  Critical care was necessary to treat or prevent imminent or life-threatening deterioration.  Critical care was time spent personally by me on the following activities: development of treatment plan with patient and/or surrogate as well as nursing, discussions with consultants, evaluation of patient's response to treatment, examination of patient, obtaining history from patient or surrogate, ordering and performing treatments and interventions, ordering and review of laboratory studies, ordering and review of radiographic studies, pulse oximetry and re-evaluation of patient's condition.   Otilio Carpen Yossi Hinchman, PA-C [facility] See Amion for pager If no response to pager , please call [phone] until 7pm After 7:00 pm call Elink  [phone]

## 2022-02-20 NOTE — TOC Progression Note (Addendum)
Transition of Care [facility]) - Progression Note    Patient Details  Name: Joann [name] MRN: [id number] Date of Birth: 05/22/1981  [facility] CM/SW Contact  Siddarth Hsiung, Juliann Pulse, RN Phone Number: 02/20/2022, 2:23 PM  Clinical Narrative:  Contacted [facility]-rep Michelle-faxed with confirmation clinicals-they will assess& determine acceptance-info on f/u section of d/c.      Expected Discharge Plan: Home/Self Care Barriers to Discharge: Continued Medical Work up  Expected Discharge Plan and Services Expected Discharge Plan: Home/Self Care In-house Referral: Clinical Social Work Discharge Planning Services: CM Consult Post Acute Care Choice: NA Living arrangements for the past 2 months: Group Home                 DME Arranged: N/A DME Agency: NA                   Social Determinants of Health (SDOH) Interventions    Readmission Risk Interventions    02/19/2022   10:32 AM  Readmission Risk Prevention Plan  HRI or [place] Complete  Social Work Consult for [place] Planning/Counseling Complete  Palliative Care Screening Not Applicable  Medication Review [employer]) Complete

## 2022-02-20 NOTE — Progress Notes (Signed)
[facility] Progress Note Patient Name: Joann [name] DOB: 10/05/1980 MRN: [id number]   Date of Service  02/20/2022  HPI/Events of Note  Agitation - No improvement with Haldol. Patient screaming and trying to get out of restraints.   eICU Interventions  Plan: Increase Ativan to 1 mg IV Q 2 hours PRN agitation.      Intervention Category Major Interventions: Delirium, psychosis, severe agitation - evaluation and management  Brain Honeycutt Eugene 02/20/2022, 12:52 AM

## 2022-02-21 ENCOUNTER — Ambulatory Visit: Payer: Medicaid Other | Admitting: Nurse Practitioner

## 2022-02-21 ENCOUNTER — Inpatient Hospital Stay: Payer: Self-pay

## 2022-02-21 DIAGNOSIS — G9340 Encephalopathy, unspecified: Secondary | ICD-10-CM | POA: Diagnosis not present

## 2022-02-21 DIAGNOSIS — R17 Unspecified jaundice: Secondary | ICD-10-CM

## 2022-02-21 DIAGNOSIS — F10931 Alcohol use, unspecified with withdrawal delirium: Secondary | ICD-10-CM | POA: Diagnosis not present

## 2022-02-21 LAB — GLUCOSE, CAPILLARY
Glucose-Capillary: 104 mg/dL — ABNORMAL HIGH (ref 70–99)
Glucose-Capillary: 126 mg/dL — ABNORMAL HIGH (ref 70–99)
Glucose-Capillary: 92 mg/dL (ref 70–99)
Glucose-Capillary: 105 mg/dL — ABNORMAL HIGH (ref 70–99)
Glucose-Capillary: 108 mg/dL — ABNORMAL HIGH (ref 70–99)
Glucose-Capillary: 83 mg/dL (ref 70–99)

## 2022-02-21 LAB — CBC WITH DIFFERENTIAL/PLATELET
Abs Immature Granulocytes: 0.01 10*3/uL (ref 0.00–0.07)
Basophils Absolute: 0 10*3/uL (ref 0.0–0.1)
Basophils Relative: 0 %
Eosinophils Absolute: 0.1 10*3/uL (ref 0.0–0.5)
Eosinophils Relative: 1 %
HCT: 28 % — ABNORMAL LOW (ref 36.0–46.0)
Hemoglobin: 7.9 g/dL — ABNORMAL LOW (ref 12.0–15.0)
Immature Granulocytes: 0 %
Lymphocytes Relative: 58 %
Lymphs Abs: 2.1 10*3/uL (ref 0.7–4.0)
MCH: 25.9 pg — ABNORMAL LOW (ref 26.0–34.0)
MCHC: 28.2 g/dL — ABNORMAL LOW (ref 30.0–36.0)
MCV: 91.8 fL (ref 80.0–100.0)
Monocytes Absolute: 0.5 10*3/uL (ref 0.1–1.0)
Monocytes Relative: 13 %
Neutro Abs: 1 10*3/uL — ABNORMAL LOW (ref 1.7–7.7)
Neutrophils Relative %: 28 %
Platelets: 50 10*3/uL — ABNORMAL LOW (ref 150–400)
RBC: 3.05 MIL/uL — ABNORMAL LOW (ref 3.87–5.11)
RDW: 20.8 % — ABNORMAL HIGH (ref 11.5–15.5)
WBC: 3.7 10*3/uL — ABNORMAL LOW (ref 4.0–10.5)
nRBC: 0.5 % — ABNORMAL HIGH (ref 0.0–0.2)

## 2022-02-21 LAB — COMPREHENSIVE METABOLIC PANEL
ALT: 18 U/L (ref 0–44)
AST: 89 U/L — ABNORMAL HIGH (ref 15–41)
Albumin: 2.7 g/dL — ABNORMAL LOW (ref 3.5–5.0)
Alkaline Phosphatase: 100 U/L (ref 38–126)
Anion gap: 4 — ABNORMAL LOW (ref 5–15)
BUN: <5 mg/dL — ABNORMAL LOW (ref 6–20)
CO2: 19 mmol/L — ABNORMAL LOW (ref 22–32)
Calcium: 8.1 mg/dL — ABNORMAL LOW (ref 8.9–10.3)
Chloride: 119 mmol/L — ABNORMAL HIGH (ref 98–111)
Creatinine, Ser: 0.48 mg/dL (ref 0.44–1.00)
GFR, Estimated: >60 mL/min (ref >60)
Glucose, Bld: 109 mg/dL — ABNORMAL HIGH (ref 70–99)
Potassium: 3.4 mmol/L — ABNORMAL LOW (ref 3.5–5.1)
Sodium: 142 mmol/L (ref 135–145)
Total Bilirubin: 1.2 mg/dL (ref 0.3–1.2)
Total Protein: 7.2 g/dL (ref 6.5–8.1)

## 2022-02-21 LAB — FIBRINOGEN: Fibrinogen: 236 mg/dL (ref 210–475)

## 2022-02-21 LAB — D-DIMER, QUANTITATIVE: D-Dimer, Quant: 2.29 ug/mL-FEU — ABNORMAL HIGH (ref 0.00–0.50)

## 2022-02-21 LAB — HAPTOGLOBIN: Haptoglobin: <10 mg/dL — ABNORMAL LOW (ref 42–296)

## 2022-02-21 MED ORDER — SODIUM CHLORIDE 0.9% FLUSH
10.0000 mL | Freq: Two times a day (BID) | INTRAVENOUS | Status: DC
  Administered 2022-02-21: 10 mL
  Administered 2022-02-21: 20 mL
  Administered 2022-02-22: 30 mL
  Administered 2022-02-22: 10 mL
  Administered 2022-02-23: 20 mL
  Administered 2022-02-23 – 2022-02-24 (×2): 10 mL
  Administered 2022-02-24: 30 mL
  Administered 2022-02-25: 10 mL

## 2022-02-21 MED ORDER — CHLORHEXIDINE GLUCONATE 0.12 % MT SOLN
15.0000 mL | Freq: Two times a day (BID) | OROMUCOSAL | Status: DC
  Administered 2022-02-21 – 2022-02-24 (×6): 15 mL via OROMUCOSAL
  Filled 2022-02-21 (×6): qty 15

## 2022-02-21 MED ORDER — SODIUM CHLORIDE 0.9% FLUSH: 10.0000 mL | INTRAVENOUS | Status: DC | PRN

## 2022-02-21 MED ORDER — MIDODRINE HCL 5 MG PO TABS
10.0000 mg | ORAL_TABLET | Freq: Three times a day (TID) | ORAL | Status: DC
  Administered 2022-02-21 – 2022-02-23 (×5): 10 mg
  Filled 2022-02-21 (×5): qty 2

## 2022-02-21 MED ORDER — ORAL CARE MOUTH RINSE
15.0000 mL | Freq: Two times a day (BID) | OROMUCOSAL | Status: DC
  Administered 2022-02-21 – 2022-02-24 (×7): 15 mL via OROMUCOSAL

## 2022-02-21 MED ORDER — POTASSIUM CHLORIDE 10 MEQ/100ML IV SOLN
10.0000 meq | INTRAVENOUS | Status: AC
  Administered 2022-02-21 (×4): 10 meq via INTRAVENOUS
  Filled 2022-02-21 (×3): qty 100

## 2022-02-21 MED ORDER — ALBUMIN HUMAN 25 % IV SOLN
25.0000 g | Freq: Once | INTRAVENOUS | Status: AC
  Administered 2022-02-21: 25 g via INTRAVENOUS
  Filled 2022-02-21: qty 100

## 2022-02-21 NOTE — Progress Notes (Signed)
NAME:  KIMIYA BRUNELLE, MRN:  [id number], DOB:  1980/10/17, LOS: 6 ADMISSION DATE:  02/15/2022, CONSULTATION DATE:  02/16/2022 REFERRING MD:  Dr. Erlinda Hong - [facility], CHIEF COMPLAINT:  ETOH withdrawal    History of Present Illness:  HARNEET NOBLETT is a 41 y.o. female with a PMH significant for ETOH use, alcohol induced pancreatitis, hepatic encephalopathy, HTN, and anxiety who presented to the ED 6/8 for nausea and vomiting with later development of agitation and AMS.   While in ED patient became progressively agitated requiring application of 4 point restraints. She was seen tachycardic, with all other vitals within normal limits. CIWA protocol was initiated for acute alcohol withdrawal and patient was admitted per [facility].   By morning of 6/9 patient was seen with change in mentation (less arousable) with observed diaphoresis and tremors prompting move to ICU and PCCM consult   Pertinent  Medical History  ETOH use, alcohol induced pancreatitis, hepatic encephalopathy, HTN, and anxiety  Significant Hospital Events: Including procedures, antibiotic start and stop dates in addition to other pertinent events   6/8 Presented with nausea and vomiting with later development of agitation and AMS.  6/9 Worsening withdrawal prompting move to ICU and PCCM consult  6/12 stable remains on phenobarbital and ativan, hyperkalemic overnight and lokelma ordered 6/13 agitated yesterday evening and started on precedex which led to hypotension, peripheral neo started and pt became bradycardic.  Changed to Levophed, Haldol overnight.  On prophylactic ceftriaxone  Interim History / Subjective:  RN reports pt not tolerating levophed being weaned below 13mg, pt delirious but alert  Tmax 99.7  Blood cultures pending  I/O - purewick in place, bed soaked.  1L stool, remains in positive balance per I/O's   Objective   Blood pressure 107/68, pulse 85, temperature 99.7 F (37.6 C), temperature source Axillary, resp. rate (!) 22, height 5'  3" (1.6 m), weight 72.6 kg, SpO2 99 %.        Intake/Output Summary (Last 24 hours) at 02/21/2022 1106 Last data filed at 02/21/2022 03500Gross per 24 hour  Intake 4480.31 ml  Output 1700 ml  Net 2780.31 ml   Filed Weights   02/18/22 0407 02/19/22 0500 02/21/22 0500  Weight: 74 kg 69.3 kg 72.6 kg   Exam: General: chronically ill appearing adult female lying in bed in NAD  HEENT: MM pink/dry, faint icterus, pupils reactive  Neuro: Awake, alert, oriented to self only, MAE/normal strength CV: s1s2 RRR, no m/r/g PULM: non-labored at rest, lungs bilaterally clear with good air entry GI: soft, bsx4 active  Extremities: warm/dry, no edema  Skin: no rashes or lesions  Resolved Hospital Problem list     Assessment & Plan:   Acute encephalopathy likely metabolic and secondary ETOH withdrawal and  benzodiazepine/antipsychotic administration and hepatic encephalopathy CTH without acute findings. Difficult to manage delirium/encephalopathy.  -lactulose BID, increase for goal stool output of 5040mQD  -continue rifaximin  -delirium prevention measures  -continue precedex, PRN ativan, phenobarbital taper  -thiamine, folate, MVI   Alcohol Withdrawal Hypokalemia, hypomagnesemia, hypophosphatemia Tremulous, combative, disoriented prior to arrival to the ICU.  Continued agitation in the ICU despite phenobarbital taper and CIWA . Had hypotension with precedex + bradycardia.  -CIWA with ativan  -continue precedex, wean to off as symptom burden allows -phenobarbital taper  -monitor electrolytes, replace as indicated  Hypotension Sepsis vs component of volume depletion with GI loss in stool volume  -add midodrine PT -albumin 25g IV -wean levophed for MAP >65 -1/2 NS at  66m/hr   Hyperbilirubinemia Alcoholic Cirrhosis  Thrombocytopenia Leukopenia Coagulopathy  Likely in the setting of ETOH abuse. [place]Q [place] ago with findings of cirrhosis and probable portal HTN.  Acute hepatitis  panel negative. +Schistocytes on peripheral smear. Haptoglobin negative. LDH mildly elevated. DDx includes DIC in the setting of suspected SBP / sepsis, TTP, TMA.  Less likely HUS given no renal dysfunction.  -add on fibrinogen, DIC panel   -continue SBP prophylaxis with ceftriaxone  -supportive measures, follow I/O's -discontinue PRN tylenol given hepatic dysfunction   Severe Protein Calorie Malnutrition (POA) -continue TF per Nutrition  -prosource   Best Practice (right click and "Reselect all SmartList Selections" daily)  Diet/type: tubefeeds,  DVT prophylaxis: SCD GI prophylaxis: PPI Lines: N/A Foley:  N/A Code Status:  full code Last date of multidisciplinary goals of care discussion: will update family on arrival 6/14   Critical care time: 358minutes   BNoe Gens MSN, APRN, NP-C, AGACNP-BC [facility] 02/21/2022, 11:06 AM   Please see [email or web address] for pager details.   From 7A-7P if no response, please call [phone] After hours, please call ELink [phone]

## 2022-02-21 NOTE — Progress Notes (Signed)
K+ 3.4 Replaced per protocol  

## 2022-02-21 NOTE — Progress Notes (Signed)
IV Team consult for vasopressor through midline. Advised RN that vasopressors are not generally recommended for midlines. It makes it difficult to know if there is an infiltration. Pt. Does have US guided PIV which is preferred over midline and should be used with an iWatch. Policy currently states that a pressor can be used for up to 72 hrs with iWatch in US guided PIV.
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
## 2022-02-21 NOTE — Progress Notes (Signed)
Peripherally Inserted Central Catheter Placement  The IV Nurse has discussed with the patient and/or persons authorized to consent for the patient, the purpose of this procedure and the potential benefits and risks involved with this procedure.  The benefits include less needle sticks, lab draws from the catheter, and the patient may be discharged home with the catheter. Risks include, but not limited to, infection, bleeding, blood clot (thrombus formation), and puncture of an artery; nerve damage and irregular heartbeat and possibility to perform a PICC exchange if needed/ordered by physician.  Alternatives to this procedure were also discussed.  Bard Power PICC patient education guide, fact sheet on infection prevention and patient information card has been provided to patient /or left at bedside.    PICC Placement Documentation  PICC Double Lumen 02/21/22 Right Brachial 35 cm 0 cm (Active)  Indication for Insertion or Continuance of Line Prolonged intravenous therapies 02/21/22 1423  Exposed Catheter (cm) 0 cm 02/21/22 1423  Site Assessment Clean, Dry, Intact 02/21/22 1423  Lumen #1 Status Flushed;Saline locked;Blood return noted 02/21/22 1423  Lumen #2 Status Flushed;Saline locked;Blood return noted 02/21/22 1423  Dressing Type Transparent;Securing device 02/21/22 1423  Dressing Status Antimicrobial disc in place 02/21/22 1423  Dressing Intervention New dressing;Other (Comment) 02/21/22 1423  Dressing Change Due 02/28/22 02/21/22 1423    Telephone consent signed by husband   Synthia Innocent 02/21/2022, 2:24 PM

## 2022-02-22 ENCOUNTER — Inpatient Hospital Stay (HOSPITAL_COMMUNITY)
Admit: 2022-02-22 | Discharge: 2022-02-22 | Disposition: A | Discharge status: Home / Self Care | Payer: Medicaid Other | Attending: Neurology | Admitting: Neurology

## 2022-02-22 DIAGNOSIS — G9340 Encephalopathy, unspecified: Secondary | ICD-10-CM | POA: Diagnosis not present

## 2022-02-22 DIAGNOSIS — D696 Thrombocytopenia, unspecified: Secondary | ICD-10-CM | POA: Diagnosis not present

## 2022-02-22 DIAGNOSIS — F10931 Alcohol use, unspecified with withdrawal delirium: Secondary | ICD-10-CM | POA: Diagnosis not present

## 2022-02-22 LAB — COMPREHENSIVE METABOLIC PANEL
ALT: 16 U/L (ref 0–44)
AST: 82 U/L — ABNORMAL HIGH (ref 15–41)
Albumin: 2.9 g/dL — ABNORMAL LOW (ref 3.5–5.0)
Alkaline Phosphatase: 96 U/L (ref 38–126)
Anion gap: 3 — ABNORMAL LOW (ref 5–15)
BUN: <5 mg/dL — ABNORMAL LOW (ref 6–20)
CO2: 22 mmol/L (ref 22–32)
Calcium: 8 mg/dL — ABNORMAL LOW (ref 8.9–10.3)
Chloride: 116 mmol/L — ABNORMAL HIGH (ref 98–111)
Creatinine, Ser: 0.45 mg/dL (ref 0.44–1.00)
GFR, Estimated: >60 mL/min (ref >60)
Glucose, Bld: 114 mg/dL — ABNORMAL HIGH (ref 70–99)
Potassium: 3.6 mmol/L (ref 3.5–5.1)
Sodium: 141 mmol/L (ref 135–145)
Total Bilirubin: 1 mg/dL (ref 0.3–1.2)
Total Protein: 7.3 g/dL (ref 6.5–8.1)

## 2022-02-22 LAB — CBC WITH DIFFERENTIAL/PLATELET
Abs Immature Granulocytes: 0.01 10*3/uL (ref 0.00–0.07)
Basophils Absolute: 0 10*3/uL (ref 0.0–0.1)
Basophils Relative: 0 %
Eosinophils Absolute: 0.1 10*3/uL (ref 0.0–0.5)
Eosinophils Relative: 2 %
HCT: 28.4 % — ABNORMAL LOW (ref 36.0–46.0)
Hemoglobin: 8.3 g/dL — ABNORMAL LOW (ref 12.0–15.0)
Immature Granulocytes: 0 %
Lymphocytes Relative: 51 %
Lymphs Abs: 1.6 10*3/uL (ref 0.7–4.0)
MCH: 26.9 pg (ref 26.0–34.0)
MCHC: 29.2 g/dL — ABNORMAL LOW (ref 30.0–36.0)
MCV: 91.9 fL (ref 80.0–100.0)
Monocytes Absolute: 0.4 10*3/uL (ref 0.1–1.0)
Monocytes Relative: 14 %
Neutro Abs: 1.1 10*3/uL — ABNORMAL LOW (ref 1.7–7.7)
Neutrophils Relative %: 33 %
Platelets: 44 10*3/uL — ABNORMAL LOW (ref 150–400)
RBC: 3.09 MIL/uL — ABNORMAL LOW (ref 3.87–5.11)
RDW: 20.7 % — ABNORMAL HIGH (ref 11.5–15.5)
WBC: 3.2 10*3/uL — ABNORMAL LOW (ref 4.0–10.5)
nRBC: 0 % (ref 0.0–0.2)

## 2022-02-22 LAB — GLUCOSE, CAPILLARY
Glucose-Capillary: 91 mg/dL (ref 70–99)
Glucose-Capillary: 93 mg/dL (ref 70–99)
Glucose-Capillary: 101 mg/dL — ABNORMAL HIGH (ref 70–99)
Glucose-Capillary: 103 mg/dL — ABNORMAL HIGH (ref 70–99)
Glucose-Capillary: 48 mg/dL — ABNORMAL LOW (ref 70–99)
Glucose-Capillary: 60 mg/dL — ABNORMAL LOW (ref 70–99)
Glucose-Capillary: 114 mg/dL — ABNORMAL HIGH (ref 70–99)
Glucose-Capillary: 90 mg/dL (ref 70–99)

## 2022-02-22 LAB — CULTURE, BLOOD (ROUTINE X 2)
Culture: NO GROWTH
Culture: NO GROWTH
Special Requests: ADEQUATE
Special Requests: ADEQUATE

## 2022-02-22 LAB — CBC
HCT: 28.3 % — ABNORMAL LOW (ref 36.0–46.0)
Hemoglobin: 8.1 g/dL — ABNORMAL LOW (ref 12.0–15.0)
MCH: 26.1 pg (ref 26.0–34.0)
MCHC: 28.6 g/dL — ABNORMAL LOW (ref 30.0–36.0)
MCV: 91.3 fL (ref 80.0–100.0)
Platelets: 54 10*3/uL — ABNORMAL LOW (ref 150–400)
RBC: 3.1 MIL/uL — ABNORMAL LOW (ref 3.87–5.11)
RDW: 20.7 % — ABNORMAL HIGH (ref 11.5–15.5)
WBC: 3.9 10*3/uL — ABNORMAL LOW (ref 4.0–10.5)
nRBC: 0 % (ref 0.0–0.2)

## 2022-02-22 LAB — TROPONIN I (HIGH SENSITIVITY)
Troponin I (High Sensitivity): 7 ng/L (ref <18)
Troponin I (High Sensitivity): 8 ng/L (ref <18)

## 2022-02-22 LAB — PHOSPHORUS: Phosphorus: 2.6 mg/dL (ref 2.5–4.6)

## 2022-02-22 LAB — ADAMTS13 ACTIVITY: Adamts 13 Activity: >100 % (ref >66.8)

## 2022-02-22 LAB — MAGNESIUM: Magnesium: 1.5 mg/dL — ABNORMAL LOW (ref 1.7–2.4)

## 2022-02-22 LAB — ADAMTS13 ACTIVITY REFLEX

## 2022-02-22 LAB — LACTIC ACID, PLASMA: Lactic Acid, Venous: 1.1 mmol/L (ref 0.5–1.9)

## 2022-02-22 MED ORDER — DEXTROSE 50 % IV SOLN
12.5000 g | INTRAVENOUS | Status: AC
  Administered 2022-02-22: 12.5 g via INTRAVENOUS

## 2022-02-22 MED ORDER — DEXTROSE 50 % IV SOLN
INTRAVENOUS | Status: AC
  Administered 2022-02-22: 25 mL
  Filled 2022-02-22: qty 50

## 2022-02-22 MED ORDER — DEXMEDETOMIDINE HCL IN NACL 200 MCG/50ML IV SOLN
0.1000 ug/kg/h | INTRAVENOUS | Status: DC
  Administered 2022-02-22: 0.7 ug/kg/h via INTRAVENOUS
  Filled 2022-02-22: qty 50

## 2022-02-22 MED ORDER — ACETAMINOPHEN 325 MG PO TABS
650.0000 mg | ORAL_TABLET | ORAL | Status: DC | PRN
  Administered 2022-02-22: 650 mg
  Filled 2022-02-22: qty 2

## 2022-02-22 MED ORDER — LACTULOSE 10 GM/15ML PO SOLN
30.0000 g | Freq: Two times a day (BID) | ORAL | Status: DC
  Administered 2022-02-22: 30 g
  Filled 2022-02-22: qty 45

## 2022-02-22 MED ORDER — MAGNESIUM SULFATE 4 GM/100ML IV SOLN
4.0000 g | Freq: Once | INTRAVENOUS | Status: AC
  Administered 2022-02-22: 4 g via INTRAVENOUS
  Filled 2022-02-22: qty 100

## 2022-02-22 NOTE — Progress Notes (Addendum)
NAME:  Joann [name], MRN:  [id number], DOB:  [birth year], LOS: 7 ADMISSION DATE:  02/15/2022, CONSULTATION DATE:  02/16/2022 REFERRING MD:  Dr. Erlinda Hong - TR[facility], CHIEF COMPLAINT:  ETOH withdrawal    History of Present Illness:  Joann [name] is a 41 y.o. female with a PMH significant for ETOH use, alcohol induced pancreatitis, hepatic encephalopathy, HTN, and anxiety who presented to the ED 6/8 for nausea and vomiting with later development of agitation and AMS.   While in ED patient became progressively agitated requiring application of 4 point restraints. She was seen tachycardic, with all other vitals within normal limits. CIWA protocol was initiated for acute alcohol withdrawal and patient was admitted per TRH.   By morning of 6/9 patient was seen with change in mentation (less arousable) with observed diaphoresis and tremors prompting move to ICU and PCCM consult   Pertinent  Medical History  ETOH use, alcohol induced pancreatitis, hepatic encephalopathy, HTN, and anxiety  Significant Hospital Events: Including procedures, antibiotic start and stop dates in addition to other pertinent events   6/8 Presented with nausea and vomiting with later development of agitation and AMS.  6/9 Worsening withdrawal prompting move to ICU and PCCM consult  6/12 stable remains on phenobarbital and ativan, hyperkalemic overnight and lokelma ordered 6/13 agitated yesterday evening and started on precedex which led to hypotension, peripheral neo started and pt became bradycardic.  Changed to Levophed, Haldol overnight.  On prophylactic ceftriaxone  Interim History / Subjective:  BC negative  Afebrile  On 3L [place]  Remains off levophed  On precedex, sedate  I/O 1.2L UOP, stool 1.3L, remains 1.9L+ for last 24 hours   Objective   Blood pressure 127/80, pulse 77, temperature 98.2 F (36.8 C), temperature source Oral, resp. rate (!) 24, height 5' 3"  (1.6 m), weight 42.8 kg, SpO2 100 %.        Intake/Output Summary  (Last 24 hours) at 02/22/2022 1157 Last data filed at 02/22/2022 9562 Gross per 24 hour  Intake 3795.72 ml  Output 2525 ml  Net 1270.72 ml   Filed Weights   02/19/22 0500 02/21/22 0500 02/22/22 0338  Weight: 69.3 kg 72.6 kg 42.8 kg   Exam: General: adult female lying in bed, sedate   HEENT: MM pink/moist, [place] O2, small bore feeding tube in place, anicteric  Neuro: sedate on precedex 0.62mg, moves all extremities spontaneously  CV: s1s2 RRR, no m/r/g PULM: non-labored at rest, lungs bilaterally clear with good air entry  GI: soft, bsx4 active  Extremities: warm/dry, no edema  Skin: no rashes or lesions  Resolved Hospital Problem list     Assessment & Plan:   Acute encephalopathy likely metabolic and secondary ETOH withdrawal and  benzodiazepine/antipsychotic administration and hepatic encephalopathy CTH without acute findings. Difficult to manage delirium/encephalopathy. EEG 6/15 with diffuse slowing, nonspecific etiology, no seizures.  -lighten precedex -continue rifaximin -continue lactulose but reduce to BID with goal for stool output of ~ 5064mQD -delirium prevention measures  -continue thiamine, MVI, folate -phenobarbital taper  -PRN ativan -follow up ammonia in am  -assess EKG, if QTc wnl, may trial seroquel  Alcohol Withdrawal Hypokalemia, hypomagnesemia, hypophosphatemia Tremulous, combative, disoriented prior to arrival to the ICU.  Continued agitation in the ICU despite phenobarbital taper and CIWA . Had hypotension with precedex + bradycardia.  -CIWA with ativan  -wean precedex to off  -continue phenobarbital taper  -monitor electrolytes, replace as indicated.  Mg+ 2gm 6/15   Hypotension  Sepsis vs component of  volume depletion with GI loss in stool volume  -continue midodrine PT, wean to off  -reduce IVF to 68m/hr  -follow BP trend    Hyperbilirubinemia Alcoholic Cirrhosis  Thrombocytopenia Leukopenia Coagulopathy  Likely in the setting of ETOH  abuse. RUQ [place] ago with findings of cirrhosis and probable portal HTN.  Acute hepatitis panel negative. +Schistocytes on peripheral smear. Haptoglobin negative. LDH mildly elevated. DDx includes DIC in the setting of suspected SBP / sepsis, TTP, TMA.  Less likely HUS given no renal dysfunction.  -continue rocephin for empiric SBP coverage  -fibrinogen on lower limit for consideration of sepsis related dysfunction  -supportive measures   Severe Protein Calorie Malnutrition (POA) -TF per nutrition  -continue prosource    Best Practice (right click and "Reselect all SmartList Selections" daily)  Diet/type: tubefeeds,  DVT prophylaxis: SCD GI prophylaxis: PPI Lines: N/A Foley:  N/A Code Status:  full code Last date of multidisciplinary goals of care discussion: family updated 6/14 per Dr. RChase Caller Will update on arrival 6/15.   Critical care time: 34 minutes   BNoe Gens MSN, APRN, NP-C, AGACNP-BC [facility] 02/22/2022, 11:57 AM   Please see [email or web address] for pager details.   From 7A-7P if no response, please call [phone] After hours, please call ELink [phone]

## 2022-02-22 NOTE — Progress Notes (Signed)
eLink Physician-Brief Progress Note Patient Name: Joann [name] DOB: 08/26/1981 MRN: [id number]   Date of Service  02/22/2022  HPI/Events of Note  Patient with low grade fever.  eICU Interventions  PRN Tylenol per tube x 2 doses ordered.        Kerry Kass Caulder Wehner 02/22/2022, 10:30 PM

## 2022-02-22 NOTE — Procedures (Signed)
Patient Name: Joann [name]  MRN: [id number]  Epilepsy Attending: Lora Havens  Referring Physician/Provider: Brand Males, MD  Date: 02/22/2022 Duration: 23.53 mins  Patient history: 41 year old female with altered mental status.  EEG to evaluate for seizure.  Level of alertness: Awake  AEDs during EEG study: Phenobarb  Technical aspects: This EEG study was done with scalp electrodes positioned according to the 10-20 International system of electrode placement. Electrical activity was acquired at a sampling rate of 500Hz  and reviewed with a high frequency filter of 70Hz  and a low frequency filter of 1Hz . EEG data were recorded continuously and digitally stored.   Description: No clear posterior dominant rhythm was seen.  EEG showed intermittent generalized 3-5Hz  theta-delta slowing admixed with an excessive amount of 15 to 18 Hz beta activity distributed symmetrically and diffusely. Hyperventilation and photic stimulation were not performed.     ABNORMALITY - Intermittent slow, generalized - Excessive beta, generalized  IMPRESSION: This study is suggestive of mild diffuse encephalopathy, nonspecific etiology. The excessive beta activity seen in the background is most likely due to the effect of benzodiazepine and is a benign EEG pattern. No seizures or epileptiform discharges were seen throughout the recording.  Jashawn Floyd Barbra Sarks
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
## 2022-02-22 NOTE — Progress Notes (Signed)
[facility] ADULT ICU REPLACEMENT PROTOCOL   The patient does apply for the [facility] Adult ICU Electrolyte Replacment Protocol based on the criteria listed below:   1.Exclusion criteria: TCTS patients, ECMO patients, and Dialysis patients 2. Is GFR >/= 30 ml/min? Yes.    Patient's GFR today is >60 3. Is SCr </= 2? Yes.   Patient's SCr is 0.45 mg/dL 4. Did SCr increase >/= 0.5 in 24 hours? No. 5.Pt's weight >40kg  Yes.   6. Abnormal electrolyte(s): Mag 1.5  7. Electrolytes replaced per protocol 8.  Call MD STAT for K+ </= 2.5, Phos </= 1, or Mag </= 1 Physician:  Ignacia Marvel 02/22/2022 5:09 AM

## 2022-02-22 NOTE — Progress Notes (Signed)
EEG complete - results pending 

## 2022-02-22 NOTE — Progress Notes (Signed)
[place] Progress Note Patient Name: Joann [name] DOB: 04-07-1981 MRN: [id number]   Date of Service  02/22/2022  HPI/Events of Note  Patient with transient complaint of chest pain , EKG without acute changes, patient is not a reliable historian due to ongoing ETOH withdrawal delirium. She is currently fast asleep.  eICU Interventions  Troponin trended.        Kerry Kass Armelia Penton 02/22/2022, 4:26 AM

## 2022-02-23 ENCOUNTER — Inpatient Hospital Stay (HOSPITAL_COMMUNITY): Payer: Medicaid Other

## 2022-02-23 DIAGNOSIS — F10931 Alcohol use, unspecified with withdrawal delirium: Secondary | ICD-10-CM | POA: Diagnosis not present

## 2022-02-23 LAB — PROTIME-INR
INR: 1.5 — ABNORMAL HIGH (ref 0.8–1.2)
Prothrombin Time: 18.1 seconds — ABNORMAL HIGH (ref 11.4–15.2)

## 2022-02-23 LAB — GLUCOSE, CAPILLARY
Glucose-Capillary: 98 mg/dL (ref 70–99)
Glucose-Capillary: 79 mg/dL (ref 70–99)
Glucose-Capillary: 78 mg/dL (ref 70–99)
Glucose-Capillary: 114 mg/dL — ABNORMAL HIGH (ref 70–99)
Glucose-Capillary: 94 mg/dL (ref 70–99)
Glucose-Capillary: 117 mg/dL — ABNORMAL HIGH (ref 70–99)
Glucose-Capillary: 93 mg/dL (ref 70–99)

## 2022-02-23 LAB — AMMONIA: Ammonia: 69 umol/L — ABNORMAL HIGH (ref 9–35)

## 2022-02-23 LAB — COMPREHENSIVE METABOLIC PANEL
ALT: 16 U/L (ref 0–44)
AST: 82 U/L — ABNORMAL HIGH (ref 15–41)
Albumin: 2.8 g/dL — ABNORMAL LOW (ref 3.5–5.0)
Alkaline Phosphatase: 108 U/L (ref 38–126)
Anion gap: 5 (ref 5–15)
BUN: 7 mg/dL (ref 6–20)
CO2: 21 mmol/L — ABNORMAL LOW (ref 22–32)
Calcium: 7.6 mg/dL — ABNORMAL LOW (ref 8.9–10.3)
Chloride: 110 mmol/L (ref 98–111)
Creatinine, Ser: 0.46 mg/dL (ref 0.44–1.00)
GFR, Estimated: >60 mL/min (ref >60)
Glucose, Bld: 88 mg/dL (ref 70–99)
Potassium: 3.5 mmol/L (ref 3.5–5.1)
Sodium: 136 mmol/L (ref 135–145)
Total Bilirubin: 1.1 mg/dL (ref 0.3–1.2)
Total Protein: 6.9 g/dL (ref 6.5–8.1)

## 2022-02-23 LAB — CBC
HCT: 25.8 % — ABNORMAL LOW (ref 36.0–46.0)
Hemoglobin: 7.6 g/dL — ABNORMAL LOW (ref 12.0–15.0)
MCH: 26.5 pg (ref 26.0–34.0)
MCHC: 29.5 g/dL — ABNORMAL LOW (ref 30.0–36.0)
MCV: 89.9 fL (ref 80.0–100.0)
Platelets: 53 10*3/uL — ABNORMAL LOW (ref 150–400)
RBC: 2.87 MIL/uL — ABNORMAL LOW (ref 3.87–5.11)
RDW: 20.8 % — ABNORMAL HIGH (ref 11.5–15.5)
WBC: 4.3 10*3/uL (ref 4.0–10.5)
nRBC: 0 % (ref 0.0–0.2)

## 2022-02-23 MED ORDER — LACTULOSE 10 GM/15ML PO SOLN
20.0000 g | Freq: Two times a day (BID) | ORAL | Status: DC
Start: 2022-02-23 — End: 2022-02-24
  Administered 2022-02-23 (×2): 20 g via ORAL
  Filled 2022-02-23 (×2): qty 30

## 2022-02-23 MED ORDER — FOLIC ACID 1 MG PO TABS
1.0000 mg | ORAL_TABLET | Freq: Every day | ORAL | Status: DC
  Administered 2022-02-24 – 2022-02-25 (×2): 1 mg via ORAL
  Filled 2022-02-23 (×2): qty 1

## 2022-02-23 MED ORDER — PHENOBARBITAL 32.4 MG PO TABS
32.4000 mg | ORAL_TABLET | Freq: Three times a day (TID) | ORAL | Status: AC
  Administered 2022-02-23: 32.4 mg via ORAL
  Filled 2022-02-23: qty 1

## 2022-02-23 MED ORDER — MIDODRINE HCL 5 MG PO TABS
10.0000 mg | ORAL_TABLET | Freq: Three times a day (TID) | ORAL | Status: DC
  Administered 2022-02-23 – 2022-02-24 (×4): 10 mg via ORAL
  Filled 2022-02-23 (×4): qty 2

## 2022-02-23 MED ORDER — ADULT MULTIVITAMIN W/MINERALS CH
1.0000 | ORAL_TABLET | Freq: Every day | ORAL | Status: DC
  Administered 2022-02-24 – 2022-02-25 (×2): 1 via ORAL
  Filled 2022-02-23 (×2): qty 1

## 2022-02-23 MED ORDER — THIAMINE HCL 100 MG PO TABS
100.0000 mg | ORAL_TABLET | Freq: Every day | ORAL | Status: DC
  Administered 2022-02-23 – 2022-02-25 (×3): 100 mg via ORAL
  Filled 2022-02-23 (×3): qty 1

## 2022-02-23 MED ORDER — RIFAXIMIN 550 MG PO TABS
550.0000 mg | ORAL_TABLET | Freq: Two times a day (BID) | ORAL | Status: DC
  Administered 2022-02-23 – 2022-02-25 (×5): 550 mg via ORAL
  Filled 2022-02-23 (×6): qty 1

## 2022-02-23 MED ORDER — LACTULOSE 10 GM/15ML PO SOLN: 30.0000 g | Freq: Two times a day (BID) | ORAL | Status: DC

## 2022-02-23 MED ORDER — POTASSIUM CHLORIDE 20 MEQ PO PACK
40.0000 meq | PACK | Freq: Once | ORAL | Status: AC
  Administered 2022-02-23: 40 meq
  Filled 2022-02-23: qty 2
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
## 2022-02-23 NOTE — SANE Note (Signed)
At approximately 3:15 PM, the SANE/FNE Naval architect) consult has been completed. The primary RN, Anson Fret, has been notified. Please contact the SANE/FNE nurse on call (listed in [place]) with any further concerns.

## 2022-02-23 NOTE — Progress Notes (Addendum)
NAME:  Joann [name], MRN:  [id number], DOB:  [birth year], LOS: 8 ADMISSION DATE:  02/15/2022, CONSULTATION DATE:  02/16/2022 REFERRING MD:  Dr. Erlinda Hong - TR[facility], CHIEF COMPLAINT:  ETOH withdrawal    History of Present Illness:  Joann [name] is a 41 y.o. female with a PMH significant for ETOH use, alcohol induced pancreatitis, hepatic encephalopathy, HTN, and anxiety who presented to the ED 6/8 for nausea and vomiting with later development of agitation and AMS.   While in ED patient became progressively agitated requiring application of 4 point restraints. She was seen tachycardic, with all other vitals within normal limits. CIWA protocol was initiated for acute alcohol withdrawal and patient was admitted per TRH.   By morning of 6/9 patient was seen with change in mentation (less arousable) with observed diaphoresis and tremors prompting move to ICU and PCCM consult   Pertinent  Medical History  ETOH use, alcohol induced pancreatitis, hepatic encephalopathy, HTN, and anxiety  Significant Hospital Events: Including procedures, antibiotic start and stop dates in addition to other pertinent events   6/8 Presented with nausea and vomiting with later development of agitation and AMS.  6/9 Worsening withdrawal prompting move to ICU and PCCM consult  6/12 stable remains on phenobarbital and ativan, hyperkalemic overnight and lokelma ordered 6/13 agitated yesterday evening and started on precedex which led to hypotension, peripheral neo started and pt became bradycardic.  Changed to Levophed, Haldol overnight.  On prophylactic ceftriaxone 6/15 Off precedex. Backed off lactulose with high output   Interim History / Subjective:  Off precedex Pt pulled gastric tube overnight, asking for food  1.8L stool out in last 24 hours  Afebrile currently, Tmax 101 / no leukocytosis  Objective   Blood pressure 108/69, pulse (!) 106, temperature 98.5 F (36.9 C), temperature source Oral, resp. rate 16, height 5' 3"  (1.6  m), weight 74.5 kg, SpO2 100 %.        Intake/Output Summary (Last 24 hours) at 02/23/2022 0902 Last data filed at 02/23/2022 0700 Gross per 24 hour  Intake 2323.83 ml  Output 2675 ml  Net -351.17 ml   Filed Weights   02/21/22 0500 02/22/22 0338 02/23/22 0347  Weight: 72.6 kg 74.8 kg 74.5 kg   Exam: General: chronically ill appearing adult female lying in bed in NAD HEENT: MM pink/moist, anicteric, pupils equal /reactive  Neuro: Awake/alert, oriented to self but otherwise not oriented, MAE CV: s1s2 RRR, no m/r/g PULM: non-labored at rest, lungs bilaterally clear  GI: soft, bsx4 active  Extremities: warm/dry, no edema  Skin: no rashes or lesions  Resolved Hospital Problem list     Assessment & Plan:   Acute encephalopathy likely metabolic and secondary ETOH withdrawal and  benzodiazepine/antipsychotic administration and hepatic encephalopathy CTH without acute findings. Difficult to manage delirium/encephalopathy. EEG 6/15 with diffuse slowing, nonspecific etiology, no seizures.  -remove precedex from [facility] -continue PRN ativan  -rifaximin -reduce lactulose to 20 mg BID, goal stool output ~517m/day -folate, MVI, thiamine  -continue phenobarbital wean  -await ammonia -QTc 448 on 6/15  Alcohol Withdrawal Hypokalemia, hypomagnesemia, hypophosphatemia Tremulous, combative, disoriented prior to arrival to the ICU.  Continued agitation in the ICU despite phenobarbital taper and CIWA . Had hypotension with precedex + bradycardia.  -CIWA with ativan  -phenobarbital taper  -monitor and replace electrolytes as indicated   Hypotension  Sepsis vs component of volume depletion with GI loss in stool volume  -wean midodrine to off  -KVO IVF with oral diet initiation  -follow  BP trend    Hyperbilirubinemia Alcoholic Cirrhosis  Thrombocytopenia Leukopenia Coagulopathy  Likely in the setting of ETOH abuse. RUQ [place] 2 months ago with findings of cirrhosis and probable portal HTN.   Acute hepatitis panel negative. +Schistocytes on peripheral smear. Haptoglobin negative. LDH mildly elevated. DDx includes DIC in the setting of suspected SBP / sepsis, TTP, TMA.  Less likely HUS given no renal dysfunction.  -slow improvement, continue supportive measures  -D7/7 rocephin  Severe Protein Calorie Malnutrition (POA) -advance diet as tolerated  -pt dislodged / pulled gastric tube overnight > low grade temp, assess CXR to ensure no aspiration pneumonitis   Deconditioning In setting of prolonged ETOH abuse, long withdrawal process / ICU illness  -PT consult  Best Practice (right click and "Reselect all SmartList Selections" daily)  Diet/type: Regular consistency (see orders),  DVT prophylaxis: SCD GI prophylaxis: PPI Lines: N/A Foley:  N/A Code Status:  full code Last date of multidisciplinary goals of care discussion: 6/14   To TRH as of 6/17 am, SDU status.    Critical care time: n/a   Noe Gens, MSN, APRN, NP-C, AGACNP-BC [facility] 02/23/2022, 9:02 AM   Please see [email or web address] for pager details.   From 7A-7P if no response, please call [phone] After hours, please call ELink [phone]

## 2022-02-23 NOTE — Progress Notes (Addendum)
On assessment patient's NG tube was pulled loose by the patient measuring at 67 (should be at 70). This nurse stopped the feeding, advanced the NG tube to 70 cm, verified placement instilled 20 cc air while listening to the patient's stomach, heard gurgling with instilled air, retaped and restarted feeding.

## 2022-02-23 NOTE — Progress Notes (Signed)
[facility] ADULT ICU REPLACEMENT PROTOCOL   The patient does apply for the [facility] Adult ICU Electrolyte Replacment Protocol based on the criteria listed below:   1.Exclusion criteria: TCTS patients, ECMO patients, and Dialysis patients 2. Is GFR >/= 30 ml/min? Yes.    Patient's GFR today is >60 3. Is SCr </= 2? Yes.   Patient's SCr is 0.46 mg/dL 4. Did SCr increase >/= 0.5 in 24 hours? No. 5.Pt's weight >40kg  Yes.   6. Abnormal electrolyte(s): K+ 3.5  7. Electrolytes replaced per protocol 8.  Call MD STAT for K+ </= 2.5, Phos </= 1, or Mag </= 1 Physician:  Ignacia Marvel 02/23/2022 3:19 AM
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
## 2022-02-23 NOTE — Progress Notes (Addendum)
This nurse was notified by the NT of this patient's low routine CBG (48); recheck on opposite hand was 60. On assessment, patient's feeding was paused. This nurse restarted NG feed after assessment of NG tube placement. Pt appeared lethargic, but otherwise able to answer questions appropriately. Per protocol, administered D50 IV (12.5 g), reassessed CBG after 15 minutes resulting in CBG of 114. Notified e-link of this occurrence.

## 2022-02-24 ENCOUNTER — Encounter (HOSPITAL_COMMUNITY): Payer: Self-pay | Admitting: Internal Medicine

## 2022-02-24 ENCOUNTER — Inpatient Hospital Stay (HOSPITAL_COMMUNITY): Payer: Medicaid Other

## 2022-02-24 DIAGNOSIS — F1014 Alcohol abuse with alcohol-induced mood disorder: Secondary | ICD-10-CM

## 2022-02-24 DIAGNOSIS — F10931 Alcohol use, unspecified with withdrawal delirium: Secondary | ICD-10-CM | POA: Diagnosis not present

## 2022-02-24 DIAGNOSIS — D72819 Decreased white blood cell count, unspecified: Secondary | ICD-10-CM

## 2022-02-24 DIAGNOSIS — E43 Unspecified severe protein-calorie malnutrition: Secondary | ICD-10-CM

## 2022-02-24 DIAGNOSIS — T7411XA Adult physical abuse, confirmed, initial encounter: Secondary | ICD-10-CM

## 2022-02-24 DIAGNOSIS — I959 Hypotension, unspecified: Secondary | ICD-10-CM

## 2022-02-24 DIAGNOSIS — D649 Anemia, unspecified: Secondary | ICD-10-CM

## 2022-02-24 DIAGNOSIS — G9340 Encephalopathy, unspecified: Secondary | ICD-10-CM | POA: Diagnosis not present

## 2022-02-24 DIAGNOSIS — F419 Anxiety disorder, unspecified: Secondary | ICD-10-CM

## 2022-02-24 DIAGNOSIS — T7491XA Unspecified adult maltreatment, confirmed, initial encounter: Secondary | ICD-10-CM

## 2022-02-24 DIAGNOSIS — D689 Coagulation defect, unspecified: Secondary | ICD-10-CM | POA: Diagnosis present

## 2022-02-24 DIAGNOSIS — F10230 Alcohol dependence with withdrawal, uncomplicated: Secondary | ICD-10-CM

## 2022-02-24 DIAGNOSIS — E878 Other disorders of electrolyte and fluid balance, not elsewhere classified: Secondary | ICD-10-CM

## 2022-02-24 HISTORY — DX: Hypotension, unspecified: I95.9

## 2022-02-24 LAB — COMPREHENSIVE METABOLIC PANEL
ALT: 16 U/L (ref 0–44)
AST: 79 U/L — ABNORMAL HIGH (ref 15–41)
Albumin: 2.9 g/dL — ABNORMAL LOW (ref 3.5–5.0)
Alkaline Phosphatase: 108 U/L (ref 38–126)
Anion gap: 6 (ref 5–15)
BUN: <5 mg/dL — ABNORMAL LOW (ref 6–20)
CO2: 20 mmol/L — ABNORMAL LOW (ref 22–32)
Calcium: 8.2 mg/dL — ABNORMAL LOW (ref 8.9–10.3)
Chloride: 107 mmol/L (ref 98–111)
Creatinine, Ser: 0.53 mg/dL (ref 0.44–1.00)
GFR, Estimated: >60 mL/min (ref >60)
Glucose, Bld: 96 mg/dL (ref 70–99)
Potassium: 3.4 mmol/L — ABNORMAL LOW (ref 3.5–5.1)
Sodium: 133 mmol/L — ABNORMAL LOW (ref 135–145)
Total Bilirubin: 1.3 mg/dL — ABNORMAL HIGH (ref 0.3–1.2)
Total Protein: 6.9 g/dL (ref 6.5–8.1)

## 2022-02-24 LAB — CBC
HCT: 25.4 % — ABNORMAL LOW (ref 36.0–46.0)
Hemoglobin: 7.5 g/dL — ABNORMAL LOW (ref 12.0–15.0)
MCH: 25.9 pg — ABNORMAL LOW (ref 26.0–34.0)
MCHC: 29.5 g/dL — ABNORMAL LOW (ref 30.0–36.0)
MCV: 87.6 fL (ref 80.0–100.0)
Platelets: 58 10*3/uL — ABNORMAL LOW (ref 150–400)
RBC: 2.9 MIL/uL — ABNORMAL LOW (ref 3.87–5.11)
RDW: 20.9 % — ABNORMAL HIGH (ref 11.5–15.5)
WBC: 4 10*3/uL (ref 4.0–10.5)
nRBC: 0 % (ref 0.0–0.2)

## 2022-02-24 LAB — GLUCOSE, CAPILLARY
Glucose-Capillary: 104 mg/dL — ABNORMAL HIGH (ref 70–99)
Glucose-Capillary: 110 mg/dL — ABNORMAL HIGH (ref 70–99)
Glucose-Capillary: 137 mg/dL — ABNORMAL HIGH (ref 70–99)
Glucose-Capillary: 82 mg/dL (ref 70–99)
Glucose-Capillary: 93 mg/dL (ref 70–99)

## 2022-02-24 LAB — URINALYSIS, ROUTINE W REFLEX MICROSCOPIC
Bacteria, UA: NONE SEEN
Bilirubin Urine: NEGATIVE
Glucose, UA: NEGATIVE mg/dL
Ketones, ur: NEGATIVE mg/dL
Leukocytes,Ua: NEGATIVE
Nitrite: NEGATIVE
Protein, ur: NEGATIVE mg/dL
Specific Gravity, Urine: 1.008 (ref 1.005–1.030)
pH: 8 (ref 5.0–8.0)

## 2022-02-24 LAB — MAGNESIUM: Magnesium: 1.7 mg/dL (ref 1.7–2.4)

## 2022-02-24 MED ORDER — TRAZODONE HCL 50 MG PO TABS
50.0000 mg | ORAL_TABLET | Freq: Every evening | ORAL | Status: DC | PRN
  Administered 2022-02-24: 50 mg via ORAL
  Filled 2022-02-24: qty 1

## 2022-02-24 MED ORDER — IOHEXOL 9 MG/ML PO SOLN
ORAL | Status: AC
  Filled 2022-02-24: qty 1000

## 2022-02-24 MED ORDER — FERROUS SULFATE 325 (65 FE) MG PO TABS
325.0000 mg | ORAL_TABLET | Freq: Every day | ORAL | Status: DC
  Administered 2022-02-25: 325 mg via ORAL
  Filled 2022-02-24: qty 1

## 2022-02-24 MED ORDER — IOHEXOL 300 MG/ML  SOLN
100.0000 mL | Freq: Once | INTRAMUSCULAR | Status: AC | PRN
  Administered 2022-02-24: 100 mL via INTRAVENOUS

## 2022-02-24 MED ORDER — POTASSIUM CHLORIDE CRYS ER 20 MEQ PO TBCR
40.0000 meq | EXTENDED_RELEASE_TABLET | Freq: Once | ORAL | Status: AC
  Administered 2022-02-24: 40 meq via ORAL
  Filled 2022-02-24: qty 2

## 2022-02-24 MED ORDER — MAGNESIUM SULFATE 2 GM/50ML IV SOLN
2.0000 g | Freq: Once | INTRAVENOUS | Status: AC
  Administered 2022-02-24: 2 g via INTRAVENOUS
  Filled 2022-02-24: qty 50

## 2022-02-24 MED ORDER — IOHEXOL 9 MG/ML PO SOLN
500.0000 mL | ORAL | Status: AC
  Administered 2022-02-24 (×2): 500 mL via ORAL

## 2022-02-24 MED ORDER — PANTOPRAZOLE SODIUM 40 MG PO TBEC: 40.0000 mg | DELAYED_RELEASE_TABLET | Freq: Every day | ORAL | Status: DC

## 2022-02-24 MED ORDER — ACETAMINOPHEN 325 MG PO TABS
650.0000 mg | ORAL_TABLET | ORAL | Status: AC | PRN
  Administered 2022-02-24: 650 mg via ORAL

## 2022-02-24 MED ORDER — LACTULOSE 10 GM/15ML PO SOLN
10.0000 g | Freq: Two times a day (BID) | ORAL | Status: DC
  Administered 2022-02-24 – 2022-02-25 (×3): 10 g via ORAL
  Filled 2022-02-24 (×3): qty 15

## 2022-02-24 NOTE — Hospital Course (Signed)
41 year old African-American female history of alcohol abuse, alcoholic cirrhosis, initially admitted for concerns of alcohol withdrawals
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
## 2022-02-24 NOTE — Evaluation (Signed)
Occupational Therapy Evaluation Patient Details Name: Joann [name] MRN: [id number] DOB: 30-Dec-1980 Today's Date: 02/24/2022   History of Present Illness Patient is a 41 yo female presenting to the ED with AMS. Admitted with severe anemia, hepatic encephalopathy, and hypokalemia. PMH is significant for alcoholic cirrhosis, history of hepatic encephalopathy, ETOH abuse, history of alcoholic pancreatitis and esophageal varices.   Clinical Impression   Joann [name] is a 42 year old woman with above medical history. On evaluation she demonstrates good upper body strength and tolerance but mildly impaired balance needing assistance to steady. From a functional stand point she is able to physically perform ADLs - limited mostly by being tethered down by lines/leads. From a functional stand point patient has no OT needs. Will defer to PT for balance and ambulation training.       Recommendations for follow up therapy are one component of a multi-disciplinary discharge planning process, led by the attending physician.  Recommendations may be updated based on patient status, additional functional criteria and insurance authorization.   Follow Up Recommendations  No OT follow up    Assistance Recommended at Discharge None  Patient can return home with the following Assistance with cooking/housework    Functional Status Assessment  Patient has had a recent decline in their functional status and demonstrates the ability to make significant improvements in function in a reasonable and predictable amount of time.  Equipment Recommendations  None recommended by OT    Recommendations for Other Services       Precautions / Restrictions Precautions Precautions: Fall Precaution Comments: rectal tube Restrictions Weight Bearing Restrictions: No      Mobility Bed Mobility                    Transfers                          Balance Overall balance assessment: Mild deficits  observed, not formally tested                                         ADL either performed or assessed with clinical judgement   ADL Overall ADL's : Needs assistance/impaired Eating/Feeding: Independent   Grooming: Supervision/safety   Upper Body Bathing: Supervision/ safety   Lower Body Bathing: Supervison/ safety   Upper Body Dressing : Supervision/safety   Lower Body Dressing: Min guard   Toilet Transfer: Economist and Hygiene: Min guard       Functional mobility during ADLs: Min guard General ADL Comments: Overall min guard for ADLs. Min assist for steadying fora mbulation and needing to hold on to IV pole.     Vision   Vision Assessment?: No apparent visual deficits     Perception     Praxis      Pertinent Vitals/Pain Pain Assessment Pain Assessment: No/denies pain     Hand Dominance Right   Extremity/Trunk Assessment Upper Extremity Assessment Upper Extremity Assessment: Overall WFL for tasks assessed   Lower Extremity Assessment Lower Extremity Assessment: Defer to PT evaluation   Cervical / Trunk Assessment Cervical / Trunk Assessment: Normal   Communication Communication Communication: No difficulties   Cognition Arousal/Alertness: Awake/alert Behavior During Therapy: WFL for tasks assessed/performed Overall Cognitive Status: Within Functional Limits for tasks assessed  General Comments       Exercises     Shoulder Instructions      Home Living Family/patient expects to be discharged to:: Private residence Living Arrangements: Spouse/significant other Available Help at Discharge: Family Type of Home: House Home Access: Stairs to enter [employer] of Steps: 4   Home Layout: Two level;Able to live on main level with bedroom/bathroom Alternate Level Stairs-Number of Steps: 6   Bathroom Shower/Tub:  Occupational psychologist: Standard Bathroom Accessibility: Yes   Home Equipment: Conservation officer, nature (2 wheels);BSC/3in1          Prior Functioning/Environment Prior Level of Function : Independent/Modified Independent             Mobility Comments: Working on/off, spouse drives a truck so away for work at times. ADLs Comments: independent        OT Problem List: Impaired balance (sitting and/or standing)      OT Treatment/Interventions:      OT Goals(Current goals can be found in the care plan section) Acute Rehab OT Goals OT Goal Formulation: All assessment and education complete, DC therapy  OT Frequency:      Co-evaluation PT/OT/SLP Co-Evaluation/Treatment: Yes (coeval) Reason for Co-Treatment: For Doctor, hospital;Necessary to address cognition/behavior during functional activity   OT goals addressed during session:  (functional mobility)      AM-PAC OT "6 Clicks" Daily Activity     Outcome Measure Help from another person eating meals?: None Help from another person taking care of personal grooming?: None Help from another person toileting, which includes using toliet, bedpan, or urinal?: A Little Help from another person bathing (including washing, rinsing, drying)?: A Little Help from another person to put on and taking off regular upper body clothing?: None Help from another person to put on and taking off regular lower body clothing?: A Little 6 Click Score: 21   End of Session Nurse Communication: Mobility status  Activity Tolerance: Patient tolerated treatment well Patient left: in chair;with call bell/phone within reach  OT Visit Diagnosis: Unsteadiness on feet (R26.81)                Time: 4818-5909 OT Time Calculation (min): 20 min Charges:  OT General Charges $OT Visit: 1 Visit OT Evaluation $OT Eval Low Complexity: 1 Low  Maxyne Derocher, OTR/L [place]  Office [phone] Pager: [place] 02/24/2022, 12:42 PM

## 2022-02-24 NOTE — Progress Notes (Signed)
Pt spouse [facility]) removed from emergency contact list. Password added to chart.

## 2022-02-24 NOTE — Progress Notes (Signed)
[facility] ADULT ICU REPLACEMENT PROTOCOL   The patient does apply for the [facility] Adult ICU Electrolyte Replacment Protocol based on the criteria listed below:   1.Exclusion criteria: TCTS patients, ECMO patients, and Dialysis patients 2. Is GFR >/= 30 ml/min? Yes.    Patient's GFR today is >60 3. Is SCr </= 2? Yes.   Patient's SCr is 0.53 mg/dL 4. Did SCr increase >/= 0.5 in 24 hours? No. 5.Pt's weight >40kg  Yes.   6. Abnormal electrolyte(s): K+ 3.4  7. Electrolytes replaced per protocol 8.  Call MD STAT for K+ </= 2.5, Phos </= 1, or Mag </= 1 Physician:  Ignacia Marvel 02/24/2022 4:16 AM

## 2022-02-24 NOTE — Progress Notes (Signed)
APS called by this nurse. Call was placed to the social worker to call this nurse back to pass on information.

## 2022-02-24 NOTE — Discharge Instructions (Signed)
[facility] Tax inspector Ext. Comments  [facility] [address]  [place] [phone]  DV [place], [place], [place] [address] [phone]  Men/Woman  [facility] [place] [phone]  DV Shelter  [facility] [phone] [phone]  Families' w/Children   [facility] [address] [phone]  Men/Women/Families   [facility] [place].  [place] [phone] Men and Women  [facility] [place] [phone]    pregnant/parenting girls and women  [place] [phone]  ages [place] [phone]  Youth ages 11-17  [facility] [address] [phone]  Men  [place] [facility]  [place] ([address]  [place] [phone]  Single Women and Women w/Children  [employer] [address] [place] [phone] Men/Women/Families   Family [place] [phone]  DV Shelter  [place] [address]  [place] [phone] Men and Women  [employer] [address] [place] [phone] Men  [facility] [place] [phone] [place] [phone]  Single Women and Families  Room at the [facility]. [place] [phone] or  [phone]  Pregnant Women  [facility] [place].  [place]  [phone]  Men/Women and Families         Substance Abuse Resources Outpatient Substance Use Treatment Services   [facility]  Chemical Dependence Intensive Outpatient Program [address] [place], [place] [address]   [phone] Private insurance, Medicare A&B, and [facility]   ADS (Alcohol and Drug Services)  [address]  [place], [place] [address] [phone] Medicaid, [place] [address] [place]  [place], [place] Medicaid and [facility], Self Pay   [facility] [address]  [place], [place] [facility], and Self Pay  [facility] [place]    [place], [place] [address]  [phone] or [phone] Private Insurance Only   [facility] [place] [facility] [address]  [place], [place] [phone] Medicaid, Medicare, [place] at the [facility] [address], [place], [place] [address] [phone] Services are free or reduced  [facility]  [address] [phone]  Self Pay only, sliding scale  Caring Services  [address]  [place], [place] [address] [phone] ([facility]) Self Pay, Medicaid Only   [facility] [place], [place] [address] [phone] Medicaid, Medicare, [place]  Residential Substance Use Treatment Services   [facility] ([place]  [address]  [place], [place] [address]  [phone] or [phone] Detox (Medicare, Medicaid, private insurance, and self pay)  Residential Rehab 14 days (Medicare, [place], private insurance, and self pay)   RTS (Residential Treatment Services)  [place], [place]  Female and Female  Detox (Self Pay and Medicaid limited availability)  Rehab only Female (Medicaid and self pay only)   [facility] [place] [facility]  [place], [place] [address]  [phone] or [phone] Detox and [place]  [place].  [place],  [place] [address]  [phone]  Treatment Only, must make assessment appointment, and must be sober for assessment appointment.  Self Pay Only, Medicare A&B, [facility], [place] ID only! *Transportation assistance offered from [place] on [place], [place] [address] Walk in interviews M-Sat 8-4p No pending legal charges [phone]  ADATC:  [facility] Referral  [address] [place], [place] (Self Pay, [facility])  [facility] [address] [place], [place] [address] [phone] Detox and Residential Treatment Medicare and [place].  [place], [place] [address] [place]: [place]: [phone] Long-term Residential Program:  [phone] Males 25 and Over (No Insurance, upfront fee)  [place], [place] [address] [phone] Private Insurance with [place], [place], [place] [address] Local ([place].  [place], [place] [address]  [phone] (Males, upfront fee)  [place] of [place]  [place], [place] Locations  [facility]  [address]  [place], [place] Based Program for individuals experiencing homelessness Self Pay, No insurance  [place]  Men's program: [facility] [address] [place], [place] [address] [phone]  [place] Women's program: [facility] [address] [place], [place] [address] [phone] Christian Based Program for individuals experiencing homelessness Self Pay, No insurance  [facility] Men's Division [address] [place], [place] [address]  [place] for individuals  experiencing homelessness Self Pay, No insurance  [facility] Women's Division [place], [place] [address] [place] for individuals experiencing homelessness Self Pay, No insurance  [facility] [place], [place] for males experiencing homelessness Self Pay, No insurance
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
## 2022-02-24 NOTE — Evaluation (Signed)
Physical Therapy Evaluation Patient Details Name: Joann [name] MRN: [id number] DOB: [birth year] Today's Date: 02/24/2022  History of Present Illness  Patient is a 41 yo female presenting to the ED with AMS. Admitted with severe anemia, hepatic encephalopathy, and hypokalemia. PMH is significant for alcoholic cirrhosis, history of hepatic encephalopathy, ETOH abuse, history of alcoholic pancreatitis and esophageal varices.  Clinical Impression  Pt admitted as above and presenting with functional mobility limitations 2* generalized weakness and ambulatory balance deficits.  Pt should progress well to regain IND with no follow up PT required.     Recommendations for follow up therapy are one component of a multi-disciplinary discharge planning process, led by the attending physician.  Recommendations may be updated based on patient status, additional functional criteria and insurance authorization.  Follow Up Recommendations No PT follow up    Assistance Recommended at Discharge Intermittent Supervision/Assistance  Patient can return home with the following  A little help with walking and/or transfers;A little help with bathing/dressing/bathroom;Assistance with cooking/housework;Assist for transportation;Help with stairs or ramp for entrance    Equipment Recommendations None recommended by PT  Recommendations for Other Services       Functional Status Assessment Patient has had a recent decline in their functional status and demonstrates the ability to make significant improvements in function in a reasonable and predictable amount of time.     Precautions / Restrictions Precautions Precautions: Fall Precaution Comments: rectal tube Restrictions Weight Bearing Restrictions: No      Mobility  Bed Mobility Overal bed mobility: Needs Assistance Bed Mobility: Supine to Sit     Supine to sit: Min assist     General bed mobility comments: Modified roll and min assist to bring trunk  to upright to work around fecal tube    Transfers Overall transfer level: Needs assistance   Transfers: Sit to/from Stand Sit to Stand: Min assist           General transfer comment: assist to bring wt up and fwd and to balance in standing    Ambulation/Gait Ambulation/Gait assistance: Min assist Gait Distance (Feet): 500 Feet Assistive device: IV Pole Gait Pattern/deviations: Step-through pattern, Decreased step length - right, Decreased step length - left, Shuffle       General Gait Details: use of IV pole to steady (pt declines use of RW);  Physical assist for balance with pt currently at increased risk of falling  Stairs            Wheelchair Mobility    Modified Rankin (Stroke Patients Only)       Balance Overall balance assessment: Needs assistance Sitting-balance support: No upper extremity supported, Feet supported Sitting balance-Leahy Scale: Fair     Standing balance support: Single extremity supported Standing balance-Leahy Scale: Poor                               Pertinent Vitals/Pain Pain Assessment Pain Assessment: No/denies pain    Home Living Family/patient expects to be discharged to:: Private residence Living Arrangements: Spouse/significant other Available Help at Discharge: Family Type of Home: House Home Access: Stairs to enter   Technical brewer of Steps: 4 Alternate Level Stairs-Number of Steps: 6 Home Layout: Two level;Able to live on main level with bedroom/bathroom Home Equipment: Rolling Walker (2 wheels);BSC/3in1      Prior Function Prior Level of Function : Independent/Modified Independent             Mobility  Comments: Working on/off, spouse drives a truck so away for work at times. ADLs Comments: independent     Hand Dominance   Dominant Hand: Right    Extremity/Trunk Assessment   Upper Extremity Assessment Upper Extremity Assessment: Overall WFL for tasks assessed    Lower  Extremity Assessment Lower Extremity Assessment: Defer to PT evaluation    Cervical / Trunk Assessment Cervical / Trunk Assessment: Normal  Communication   Communication: No difficulties  Cognition Arousal/Alertness: Awake/alert Behavior During Therapy: WFL for tasks assessed/performed, Impulsive Overall Cognitive Status: Within Functional Limits for tasks assessed                                          General Comments      Exercises     Assessment/Plan    PT Assessment Patient needs continued PT services  PT Problem List Decreased strength;Decreased range of motion;Decreased activity tolerance;Decreased balance;Decreased mobility;Pain       PT Treatment Interventions DME instruction;Gait training;Stair training;Functional mobility training;Therapeutic activities;Therapeutic exercise;Patient/family education;Balance training    PT Goals (Current goals can be found in the Care Plan section)  Acute Rehab PT Goals Patient Stated Goal: REgain IND and abstain from ETOH PT Goal Formulation: With patient Time For Goal Achievement: 03/09/22 Potential to Achieve Goals: Good    Frequency Min 3X/week     Co-evaluation PT/OT/SLP Co-Evaluation/Treatment: Yes Reason for Co-Treatment: For patient/therapist safety;Necessary to address cognition/behavior during functional activity   OT goals addressed during session:  (functional mobility)       AM-PAC PT "6 Clicks" Mobility  Outcome Measure Help needed turning from your back to your side while in a flat bed without using bedrails?: None Help needed moving from lying on your back to sitting on the side of a flat bed without using bedrails?: A Little Help needed moving to and from a bed to a chair (including a wheelchair)?: A Little Help needed standing up from a chair using your arms (e.g., wheelchair or bedside chair)?: A Little Help needed to walk in hospital room?: A Little Help needed climbing 3-5 steps  with a railing? : A Little 6 Click Score: 19    End of Session Equipment Utilized During Treatment: Gait belt Activity Tolerance: Patient tolerated treatment well Patient left: in chair;with call bell/phone within reach;with chair alarm set;with nursing/sitter in room Nurse Communication: Mobility status PT Visit Diagnosis: Difficulty in walking, not elsewhere classified (R26.2)    Time: 8592-9244 PT Time Calculation (min) (ACUTE ONLY): 27 min   Charges:   PT Evaluation $PT Eval Low Complexity: 1 Low          [place] Pager [phone] Office [phone]   Roisin Mones 02/24/2022, 12:51 PM

## 2022-02-24 NOTE — Progress Notes (Signed)
NAME:  Joann [name], MRN:  [id number], DOB:  December 04, 1980, LOS: 9 ADMISSION DATE:  02/15/2022, CONSULTATION DATE:  02/16/2022 REFERRING MD:  Dr. Erlinda Hong - [facility], CHIEF COMPLAINT:  ETOH withdrawal    BRIEF  Joann [name] is a 41 y.o. female with a PMH significant for ETOH use, alcohol induced pancreatitis, hepatic encephalopathy, HTN, and anxiety who presented to the ED 6/8 for nausea and vomiting with later development of agitation and AMS.   While in ED patient became progressively agitated requiring application of 4 point restraints. She was seen tachycardic, with all other vitals within normal limits. CIWA protocol was initiated for acute alcohol withdrawal and patient was admitted per [facility].   By morning of 6/9 patient was seen with change in mentation (less arousable) with observed diaphoresis and tremors prompting move to ICU and PCCM consult   Pertinent  Medical History  ETOH use, alcohol induced pancreatitis, hepatic encephalopathy, HTN, and anxiety  Significant Hospital Events: Including procedures, antibiotic start and stop dates in addition to other pertinent events   6/8 Presented with nausea and vomiting with later development of agitation and AMS.  6/9 Worsening withdrawal prompting move to ICU and PCCM consult  6/12 stable remains on phenobarbital and ativan, hyperkalemic overnight and lokelma ordered 6/13 agitated yesterday evening and started on precedex which led to hypotension, peripheral neo started and pt became bradycardic.  Changed to Levophed, Haldol overnight.  On prophylactic ceftriaxone 6/15 Off precedex. Backed off lactulose with high output  6/16  - Off precedex. Pt pulled gastric tube overnight, asking for food ./ . 1.8L stool out in last 24 hours . Afebrile currently, Tmax 101 / no leukocytosis  Interim History / Subjective:    617 -now in stepdown unit.  She is on room air.  CCM overlapping care with [facility] hospitalist.  She is not on sedation infusion.  She is not on  pressors.  She is not on the ventilator.  Physical therapy has cleared her for independence.  Nurse observes that she can easily transfer out of the bed to the toilet.  Nurse admits that the gait is a little bit wobbly.  Patient wants to go home but husband wants her to stay through 02/26/2022 to see if [facility] will directly accept patient.  Patient is not interested in waiting here for few more days.  She wants to go home today itself.  Significant tension between the husband and wife over this.  Temperature 101.6 on 02/22/2022 and 100 on 02/23/2022.  Today temperature: 99.  White count is normal.  She is getting Tylenol.  She has chronic anemia although slightly lower than baseline.  She did get potassium replaced today.  Hospitalist did CT abdomen today and there is patchy areas of possible pyelonephritis.  Hospitalist has discussed this with infectious diseases and deemed not clinically significant.  Objective   Blood pressure 117/87, pulse 93, temperature 98.9 F (37.2 C), temperature source Oral, resp. rate 19, height 5' 3"  (1.6 m), weight 74.5 kg, SpO2 100 %.        Intake/Output Summary (Last 24 hours) at 02/24/2022 1635 Last data filed at 02/24/2022 0600 Gross per 24 hour  Intake 460 ml  Output 1790 ml  Net -1330 ml   Filed Weights   02/21/22 0500 02/22/22 0338 02/23/22 0347  Weight: 72.6 kg 74.8 kg 74.5 kg   Exam: General Appearance:  Looks well Head:  Normocephalic, without obvious abnormality, atraumatic Eyes:  PERRL - yes, conjunctiva/corneas - muddy  Ears:  Normal external ear canals, both ears Nose:  G tube - no Throat:  ETT TUBE - no , OG tube - no Neck:  Supple,  No enlargement/tenderness/nodules Lungs: Clear to auscultation bilaterally,  Heart:  S1 and S2 normal, no murmur, CVP - no.  Pressors - no Abdomen:  Soft, no masses, no organomegaly Genitalia / Rectal:  Not done Extremities:  Extremities- intacg Skin:  ntact in exposed areas . Sacral area - x Neurologic:   Sedation - none -> RASS - +1 . Moves all 4s - yes. CAM-ICU - neg . Orientation - x3+    LABS    PULMONARY No results for input(s): "PHART", "PCO2ART", "PO2ART", "HCO3", "TCO2", "O2SAT" in the last 168 hours.  Invalid input(s): "PCO2", "PO2"  CBC Recent Labs  Lab 02/22/22 1422 02/23/22 0250 02/24/22 0343  HGB 8.1* 7.6* 7.5*  HCT 28.3* 25.8* 25.4*  WBC 3.9* 4.3 4.0  PLT 54* 53* 58*    COAGULATION Recent Labs  Lab 02/20/22 0836 02/23/22 0250  INR 1.5* 1.5*    CARDIAC  No results for input(s): "TROPONINI" in the last 168 hours. No results for input(s): "PROBNP" in the last 168 hours.   CHEMISTRY Recent Labs  Lab 02/19/22 1007 02/19/22 1337 02/19/22 1643 02/19/22 1910 02/20/22 0246 02/20/22 1356 02/20/22 1357 02/21/22 0324 02/22/22 0430 02/23/22 0250 02/24/22 0343 02/24/22 0752  NA  --   --   --    < > 147*  --  143 142 141 136 133*  --   K 4.1   < >  --    < > 3.5  --  3.5 3.4* 3.6 3.5 3.4*  --   CL  --   --   --    < > 122*  --  117* 119* 116* 110 107  --   CO2  --   --   --    < > 22  --  20* 19* 22 21* 20*  --   GLUCOSE  --   --   --    < > 102*  --  116* 109* 114* 88 96  --   BUN  --   --   --    < > 7  --  6 <5* <5* 7 <5*  --   CREATININE  --   --   --    < > 0.70  --  0.55 0.48 0.45 0.46 0.53  --   CALCIUM  --   --   --    < > 8.4*  --  8.0* 8.1* 8.0* 7.6* 8.2*  --   MG 1.8  --  2.5*  --  1.9 1.8  --   --  1.5*  --   --  1.7  PHOS 2.6  --  3.4  --  2.4* 4.5  --   --  2.6  --   --   --    < > = values in this interval not displayed.   Estimated Creatinine Clearance: 88.5 mL/min (by C-G formula based on SCr of 0.53 mg/dL).   LIVER Recent Labs  Lab 02/20/22 0246 02/20/22 0836 02/21/22 0324 02/22/22 0430 02/23/22 0250 02/24/22 0343  AST 123*  --  89* 82* 82* 79*  ALT 22  --  18 16 16 16   ALKPHOS 114  --  100 96 108 108  BILITOT 1.7*  --  1.2 1.0 1.1 1.3*  PROT 7.9  --  7.2 7.3 6.9 6.9  ALBUMIN 3.1*  --  2.7* 2.9* 2.8* 2.9*  INR  --  1.5*   --   --  1.5*  --      INFECTIOUS Recent Labs  Lab 02/19/22 1910 02/22/22 0313  LATICACIDVEN 1.0 1.1     ENDOCRINE CBG (last 3)  Recent Labs    02/24/22 0311 02/24/22 0752 02/24/22 1153  GLUCAP 93 82 104*         IMAGING x48h  - image(s) personally visualized  -   highlighted in bold CT ABDOMEN PELVIS W CONTRAST  Result Date: 02/24/2022 CLINICAL DATA:  Abdominal pain EXAM: CT ABDOMEN AND PELVIS WITH CONTRAST TECHNIQUE: Multidetector CT imaging of the abdomen and pelvis was performed using the standard protocol following bolus administration of intravenous contrast. RADIATION DOSE REDUCTION: This exam was performed according to the departmental dose-optimization program which includes automated exposure control, adjustment of the mA and/or kV according to patient size and/or use of iterative reconstruction technique. CONTRAST:  165m OMNIPAQUE IOHEXOL 300 MG/ML  SOLN COMPARISON:  08/29/2018 FINDINGS: Lower chest: Unremarkable. Hepatobiliary: Liver is enlarged measuring 21.4 cm in length. There is fatty infiltration. There is mild nodularity in the liver surface. Surgical clips are seen in gallbladder fossa. No significant dilation of bile ducts is seen. Pancreas: No focal abnormality is seen. Spleen: Spleen measures 13.9 cm in maximum diameter. Adrenals/Urinary Tract: Adrenals are not enlarged. There is no hydronephrosis. There are patchy areas of decreased contrast enhancement in the renal cortex on both sides, more so in the left kidney. There is 3 mm calcific density in the midportion of left kidney. There are few smooth marginated low-density foci with fluid attenuation in the right kidney each measuring less than 13 mm possibly cysts. Ureters are not dilated. Urinary bladder is not distended. Stomach/Bowel: Small hiatal hernia is seen. Small bowel loops are not dilated. Appendix is not dilated. There is no significant wall thickening in colon. There is no pericolic stranding.  Vascular/Lymphatic: Unremarkable. Reproductive: Unremarkable. Other: Trace amount of free fluid is seen in the pelvis. This may be physiological. There is no pneumoperitoneum. Musculoskeletal: No acute findings are seen. IMPRESSION: There are patchy areas of decreased enhancement in the renal cortex more so in the left kidney suggesting possible acute pyelonephritis. There is no evidence of intestinal obstruction or pneumoperitoneum. Appendix is not dilated. There is no hydronephrosis. Enlarged liver. Nodularity in the liver surface suggests cirrhosis. Splenomegaly. Small left renal stone. There are few renal cysts. Other findings as described in the body of the report. Electronically Signed   By: PElmer PickerM.D.   On: 02/24/2022 14:22   DG CHEST PORT 1 VIEW  Result Date: 02/23/2022 CLINICAL DATA:  Aspiration pneumonitis, hypertension EXAM: PORTABLE CHEST 1 VIEW COMPARISON:  02/17/2022 FINDINGS: The heart size and mediastinal contours are within normal limits. Both lungs are clear. The visualized skeletal structures are unremarkable. IMPRESSION: No active disease. Electronically Signed   By: HKathreen DevoidM.D.   On: 02/23/2022 10:47      Resolved Hospital Problem list   Hypotension Acute encephalopathy with agitation  Assessment & Plan:  Acute encephalopathy likely metabolic and secondary ETOH withdrawal and  benzodiazepine/antipsychotic administration and hepatic encephalopathy CTH without acute findings. Difficult to manage delirium/encephalopathy. EEG 6/15 with diffuse slowing, nonspecific etiology, no seizures.  [facility]: Delirium is resolved.  She is not on any as needed benzos.  She is on rifaximin which is home medication.  She is also on lactulose which is  home medication.  Of phenobarbital   PLan - Discontinue CIWA protocol -rifaximin -Lactulose-folate, MVI, thiamine  -Clinically monitor   Hypokalemia 02/24/2022 Hypomagnesemia  02/24/2022  Plan  - Potassium repleted by hospitalist = Magnesium replacement now by CCM  Hypotension  - Sepsis vs component of volume depletion with GI loss in stool volume   02/24/2022: Blood pressure 403-474 systolic and diastolic 25-95.  On midodrine  Plan  - Stop midodrine and observe blood pressure -wean midodrine to off  -KVO IVF with oral diet initiation  -follow BP trend     Hyperbilirubinemia Alcoholic Cirrhosis  Thrombocytopenia Leukopenia Coagulopathy  Likely in the setting of ETOH abuse. RUQ [place] 2 months ago with findings of cirrhosis and probable portal HTN.  Acute hepatitis panel negative. +Schistocytes on peripheral smear. Haptoglobin negative. LDH mildly elevated. DDx includes DIC in the setting of suspected SBP / sepsis, TTP, TMA.  Less likely HUS given no renal dysfunction.   02/24/2022: Completed Rocephin yesterday.  Platelets and white count continue to improve.  Plan -Need outpatient follow-up.  Anemia of chronic disease and liver disease -recent baseline 8-9 g% Previous history of bleeding [hemoglobin low 4.4 in April 2023 = admission.  Discharge hemoglobin of 9.2 in April 2023]  02/24/2022: No active bleeding but hemoglobin 7.5 g% consistent with anemia of the ICU  Plan -Start ferrous sulfate - - PRBC for hgb </= 6.9gm%    - exceptions are   -  if ACS susepcted/confirmed then transfuse for hgb </= 8.0gm%,  or    -  active bleeding with hemodynamic instability, then transfuse regardless of hemoglobin value   At at all times try to transfuse 1 unit prbc as possible with exception of active hemorrhage    Severe Protein Calorie Malnutrition (POA)  02/24/2022: Eating orally  Plan  - Nutrition according to the hospitalist  Deconditioning - In setting of prolonged ETOH abuse, long withdrawal process / ICU illness   02/24/2022: Cleared by PT consult but observed to be mildly wobbly by the husband and bedside nursing  Plan  - Okay to observe for  physical conditioning 1 more night but if patient insist on going home today that would be okay  Best Practice (right click and "Reselect all SmartList Selections" daily)  Diet/type: Regular consistency (see orders),  DVT prophylaxis: SCD GI prophylaxis: PPI Lines: N/A Foley:  N/A Code Status:  full code  Faily: 6/17  husband wanetd her to stay in hospital. She wanted to go home. HEr mother came pon phone and advised for hsuband to be lesss difficult. He then stormed out of patient room.   DISPO per cCM 6/17 - [place] for med surg. Magnesium needs replacement. Needs observation off midoddrione and anemia needs to stay > 7gm% x 24h before safe discharge. Otherwise against medical advice. Can go to med surg 02/24/2022 and possibly discharge 02/25/22    ATTESTATION & SIGNATURE    Dr. Brand Males, M.D., [facility]C.P Pulmonary and Critical Care Medicine Medical Director - [facility] ICU Staff Physician, [place] [facility] Pager: [phone], If no answer or between  15:00h - 7:00h: call [phone]  02/24/2022 4:39 PM

## 2022-02-25 DIAGNOSIS — F10931 Alcohol use, unspecified with withdrawal delirium: Secondary | ICD-10-CM | POA: Diagnosis not present

## 2022-02-25 DIAGNOSIS — E8729 Other acidosis: Secondary | ICD-10-CM | POA: Diagnosis not present

## 2022-02-25 DIAGNOSIS — E878 Other disorders of electrolyte and fluid balance, not elsewhere classified: Secondary | ICD-10-CM

## 2022-02-25 DIAGNOSIS — F1014 Alcohol abuse with alcohol-induced mood disorder: Secondary | ICD-10-CM | POA: Diagnosis not present

## 2022-02-25 LAB — COMPREHENSIVE METABOLIC PANEL
ALT: 19 U/L (ref 0–44)
AST: 93 U/L — ABNORMAL HIGH (ref 15–41)
Albumin: 2.9 g/dL — ABNORMAL LOW (ref 3.5–5.0)
Alkaline Phosphatase: 132 U/L — ABNORMAL HIGH (ref 38–126)
Anion gap: 6 (ref 5–15)
BUN: <5 mg/dL — ABNORMAL LOW (ref 6–20)
CO2: 21 mmol/L — ABNORMAL LOW (ref 22–32)
Calcium: 8.4 mg/dL — ABNORMAL LOW (ref 8.9–10.3)
Chloride: 105 mmol/L (ref 98–111)
Creatinine, Ser: 0.45 mg/dL (ref 0.44–1.00)
GFR, Estimated: >60 mL/min (ref >60)
Glucose, Bld: 105 mg/dL — ABNORMAL HIGH (ref 70–99)
Potassium: 3.4 mmol/L — ABNORMAL LOW (ref 3.5–5.1)
Sodium: 132 mmol/L — ABNORMAL LOW (ref 135–145)
Total Bilirubin: 1.4 mg/dL — ABNORMAL HIGH (ref 0.3–1.2)
Total Protein: 7.1 g/dL (ref 6.5–8.1)

## 2022-02-25 LAB — CBC
HCT: 24.4 % — ABNORMAL LOW (ref 36.0–46.0)
Hemoglobin: 7.3 g/dL — ABNORMAL LOW (ref 12.0–15.0)
MCH: 26.2 pg (ref 26.0–34.0)
MCHC: 29.9 g/dL — ABNORMAL LOW (ref 30.0–36.0)
MCV: 87.5 fL (ref 80.0–100.0)
Platelets: 62 10*3/uL — ABNORMAL LOW (ref 150–400)
RBC: 2.79 MIL/uL — ABNORMAL LOW (ref 3.87–5.11)
RDW: 20.7 % — ABNORMAL HIGH (ref 11.5–15.5)
WBC: 4.3 10*3/uL (ref 4.0–10.5)
nRBC: 0 % (ref 0.0–0.2)

## 2022-02-25 LAB — MAGNESIUM: Magnesium: 1.7 mg/dL (ref 1.7–2.4)

## 2022-02-25 LAB — PHOSPHORUS: Phosphorus: 2.2 mg/dL — ABNORMAL LOW (ref 2.5–4.6)

## 2022-02-25 LAB — GLUCOSE, CAPILLARY
Glucose-Capillary: 102 mg/dL — ABNORMAL HIGH (ref 70–99)
Glucose-Capillary: 134 mg/dL — ABNORMAL HIGH (ref 70–99)

## 2022-02-25 MED ORDER — MAGNESIUM SULFATE 2 GM/50ML IV SOLN
2.0000 g | Freq: Once | INTRAVENOUS | Status: AC
  Administered 2022-02-25: 2 g via INTRAVENOUS
  Filled 2022-02-25: qty 50

## 2022-02-25 MED ORDER — POTASSIUM CHLORIDE CRYS ER 20 MEQ PO TBCR
60.0000 meq | EXTENDED_RELEASE_TABLET | Freq: Once | ORAL | Status: AC
  Administered 2022-02-25: 60 meq via ORAL
  Filled 2022-02-25: qty 3

## 2022-02-25 MED ORDER — FERROUS SULFATE 325 (65 FE) MG PO TABS
325.0000 mg | ORAL_TABLET | Freq: Every day | ORAL | 0 refills | Status: DC
Start: 2022-02-26 — End: 2022-07-03

## 2022-02-25 MED ORDER — THIAMINE HCL 100 MG PO TABS: 100.0000 mg | ORAL_TABLET | Freq: Every day | ORAL | 0 refills | Status: AC

## 2022-02-25 NOTE — Progress Notes (Signed)
It was little resistance to pull out the PICC line. Patient denied any pain after removed the PICC line. Advised patient to relax due to strict her vein. Educated patient that monitor on Rt. upper arm for hematoma and s/s of infection. Patient understood it well. Patient is resting on the bed for 30 min. HS [employer]
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
## 2022-02-25 NOTE — Progress Notes (Signed)
Patient will be going home with family. Belongings were returned.
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
## 2022-02-26 ENCOUNTER — Encounter: Payer: Self-pay | Admitting: Nurse Practitioner

## 2022-02-26 ENCOUNTER — Ambulatory Visit (INDEPENDENT_AMBULATORY_CARE_PROVIDER_SITE_OTHER): Payer: Medicaid Other | Admitting: Nurse Practitioner

## 2022-02-26 ENCOUNTER — Other Ambulatory Visit (INDEPENDENT_AMBULATORY_CARE_PROVIDER_SITE_OTHER): Payer: Medicaid Other

## 2022-02-26 VITALS — BP 108/56 | HR 70 | Ht 64.0 in | Wt 163.0 lb

## 2022-02-26 DIAGNOSIS — K7010 Alcoholic hepatitis without ascites: Secondary | ICD-10-CM

## 2022-02-26 DIAGNOSIS — D649 Anemia, unspecified: Secondary | ICD-10-CM | POA: Diagnosis not present

## 2022-02-26 DIAGNOSIS — F1090 Alcohol use, unspecified, uncomplicated: Secondary | ICD-10-CM

## 2022-02-26 DIAGNOSIS — K7030 Alcoholic cirrhosis of liver without ascites: Secondary | ICD-10-CM

## 2022-02-26 LAB — CBC WITH DIFFERENTIAL/PLATELET
Basophils Absolute: 0 10*3/uL (ref 0.0–0.1)
Basophils Relative: 0.9 % (ref 0.0–3.0)
Eosinophils Absolute: 0 10*3/uL (ref 0.0–0.7)
Eosinophils Relative: 0.7 % (ref 0.0–5.0)
HCT: 25.5 % — ABNORMAL LOW (ref 36.0–46.0)
Hemoglobin: 8.2 g/dL — ABNORMAL LOW (ref 12.0–15.0)
Lymphocytes Relative: 42.9 % (ref 12.0–46.0)
Lymphs Abs: 1.6 10*3/uL (ref 0.7–4.0)
MCHC: 32.2 g/dL (ref 30.0–36.0)
MCV: 82.4 fl (ref 78.0–100.0)
Monocytes Absolute: 0.6 10*3/uL (ref 0.1–1.0)
Monocytes Relative: 17.6 % — ABNORMAL HIGH (ref 3.0–12.0)
Neutro Abs: 1.4 10*3/uL (ref 1.4–7.7)
Neutrophils Relative %: 37.9 % — ABNORMAL LOW (ref 43.0–77.0)
Platelets: 76 10*3/uL — ABNORMAL LOW (ref 150.0–400.0)
RBC: 3.09 Mil/uL — ABNORMAL LOW (ref 3.87–5.11)
RDW: 22 % — ABNORMAL HIGH (ref 11.5–15.5)
WBC: 3.6 10*3/uL — ABNORMAL LOW (ref 4.0–10.5)

## 2022-02-26 LAB — BASIC METABOLIC PANEL
BUN: 4 mg/dL — ABNORMAL LOW (ref 6–23)
CO2: 24 mEq/L (ref 19–32)
Calcium: 8.9 mg/dL (ref 8.4–10.5)
Chloride: 100 mEq/L (ref 96–112)
Creatinine, Ser: 0.57 mg/dL (ref 0.40–1.20)
GFR: 111.85 mL/min (ref >60.00)
Glucose, Bld: 125 mg/dL — ABNORMAL HIGH (ref 70–99)
Potassium: 3.4 mEq/L — ABNORMAL LOW (ref 3.5–5.1)
Sodium: 132 mEq/L — ABNORMAL LOW (ref 135–145)

## 2022-02-26 LAB — HEPATIC FUNCTION PANEL
ALT: 18 U/L (ref 0–35)
AST: 92 U/L — ABNORMAL HIGH (ref 0–37)
Albumin: 3.4 g/dL — ABNORMAL LOW (ref 3.5–5.2)
Alkaline Phosphatase: 137 U/L — ABNORMAL HIGH (ref 39–117)
Bilirubin, Direct: 0.8 mg/dL — ABNORMAL HIGH (ref 0.0–0.3)
Total Bilirubin: 1.5 mg/dL — ABNORMAL HIGH (ref 0.2–1.2)
Total Protein: 8.1 g/dL (ref 6.0–8.3)

## 2022-02-26 LAB — PROTIME-INR
INR: 1.7 ratio — ABNORMAL HIGH (ref 0.8–1.0)
Prothrombin Time: 17.7 s — ABNORMAL HIGH (ref 9.6–13.1)

## 2022-02-26 MED ORDER — PHYTONADIONE 5 MG PO TABS
10.0000 mg | ORAL_TABLET | Freq: Every day | ORAL | 0 refills | Status: DC
Start: 2022-02-26 — End: 2022-08-13

## 2022-02-26 NOTE — Progress Notes (Signed)
02/26/2022 Joann [name] [id number] 1980/12/28   Chief Complaint: Cirrhosis follow up, hospital follow up  History of Present Illness:  Joann [name] is a 41 y.o. 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 Past cholecystectomy.  She was admitted to the hospital 12/17/2021 secondary to worsening hepatic encephalopathy. At that time, her husband reported she had been abstinent from alcohol for almost 2 weeks. Labs in the ED showed profound anemia with a Hg level of 4.4. Transfused 4 units of PRBCs. FOBT negative. She was placed on PPI and Octreotide infusion.  She was started on Rocephin 2 g IV prophylaxis. PLT 66. INR 1.5. K + < 2.0. Received KCL IV. Her husband reported she vomited dark brown liquid a few and she also had a few nose bleeds recently at home. No overt GI bleeding during her hospital admission. She became obtunded, required lactulose enema x 1 and oral Lactulose qid which was challenging to manage in the setting of episodic agitation requiring conservative administration of Ativan IV and soft wrist restraints.  Head CT was negative. Neurology was consulted. EEG to rule out seizures was unsuccessful as she pulled off the leads. She received high dose Thiamine. Her hepatic encephalopathy significantly improved and her clinical status stabilized. She was discharged home on 12/22/2021 with plans to follow up in our office in a few weeks and to schedule an EGD and colonoscopy. Discharge Hg was 9.6.  I saw her in office on 01/13/2022 for follow up post hospital discharge.  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  She reported being compliant with taking Xifaxan and Lactulose as prescribed and she remained  abstinent from alcohol since 12/09/2021.  She was counseled to remain abstinent from alcohol, to have labs are repeated in 2 weeks and to continue Xifaxan, lactulose, thiamine, folic acid and a multivitamin daily.  She was also scheduled for an EGD and colonoscopy secondary to IDA 03/16/2022.  Unfortunately since her last office appointment, she relapsed.  She reported binge drinking more than one fifth of vodka daily for 4 to 5 days.  She tried to detox at home but developed N/V with altered mental status.  She was readmitted to the hospital 02/15/2022 with toxic metabolic encephalopathy in the setting of alcohol withdrawal. She was agitated in the ER requiring four-point restraints and IV Haldol 10 mg. She was started on CIWA protocol with and given 2 mg of IV Ativan every 4 hours x 2.  The next morning she was less arousable with diaphoresis and tremors therefore she was transferred to the ICU placed on Precedex and Phenobarbital.  She had hypokalemia and hypomagnesia anemia which improved after IV replacement.  Her initial total bilirubin level was 5 down to 1.4. INR remained stable 1.5.  She remained on lactulose and Xifaxan.  Her clinical status and encephalopathy significantly improved and she was discharged home on 02/25/2022.  She presents to our office today for further follow-up and to obtain GI clearance for admission to [facility] rehab.  She is hoping to be admitted to [facility] rehab x 28 days within the next few days.  She stated her husband is supportive of this decision. No alcohol since being discharged home yesterday.  She is taking Lactulose 20 g twice daily,  Xifaxan 550 mg twice daily, ferrous sulfate 694 mg daily, folic acid 1 mg daily, multivitamin 1 tab daily, thiamine 100 mg 1 tab daily, vitamin B12 100 mcg 1 tab p.o. daily and Pantoprazole 40 mg twice daily.  She is not on diuretics or oral potassium.  She is passing 2-3 orange-yellow to brown loose stools daily.  No rectal bleeding or black  stools.  No abdominal pain.  No nausea or vomiting.  She denies feeling confused or disoriented at this time.  No chest pain, palpitations or shortness of breath.  She reports eating 3 healthy low salt meals daily.  Laboratory studies done today: Hg 7.3 -> 8.2. PLT 62 -> 76. T. Bili 1.4 -> 1.5.  Alk phos 137.  AST 92.  ALT 18.  K+ 3.4. Na+ 132.      Latest Ref Rng & Units 02/26/2022   11:52 AM 02/25/2022    3:38 AM 02/24/2022    3:43 AM  CMP  Glucose 70 - 99 mg/dL 125  105  96   BUN 6 - 23 mg/dL 4  <5  <5   Creatinine 0.40 - 1.20 mg/dL 0.57  0.45  0.53   Sodium 135 - 145 mEq/L 132  132  133   Potassium 3.5 - 5.1 mEq/L 3.4  3.4  3.4   Chloride 96 - 112 mEq/L 100  105  107   CO2 19 - 32 mEq/L 24  21  20    Calcium 8.4 - 10.5 mg/dL 8.9  8.4  8.2   Total Protein 6.0 - 8.3 g/dL 8.1  7.1  6.9   Total Bilirubin 0.2 - 1.2 mg/dL 1.5  1.4  1.3   Alkaline Phos 39 - 117 U/L 137  132  108   AST 0 - 37 U/L 92  93  79   ALT 0 - 35 U/L 18  19  16           Latest Ref Rng & Units 02/26/2022   11:52 AM 02/25/2022    3:38 AM 02/24/2022    3:43 AM  CBC  WBC 4.0 - 10.5 K/uL 3.6  4.3  4.0   Hemoglobin 12.0 - 15.0 g/dL 8.2 Repeated and verified X2.  7.3  7.5   Hematocrit 36.0 - 46.0 % 25.5  24.4  25.4   Platelets 150.0 - 400.0 K/uL 76.0  62  58     CTAP with contrast 02/24/2022: Lower chest: Unremarkable.   Hepatobiliary: Liver is enlarged measuring 21.4 cm in length. There is fatty infiltration. There is mild nodularity in the liver surface. Surgical clips are seen in gallbladder fossa. No significant dilation of bile ducts is seen.   Pancreas: No focal abnormality is seen.   Spleen: Spleen measures 13.9 cm in maximum diameter.   Adrenals/Urinary Tract: Adrenals are not enlarged. There is no hydronephrosis. There are patchy areas of decreased contrast enhancement in the renal cortex on both sides, more so in the left kidney. There is 3 mm calcific density in the midportion of left kidney. There  are few smooth marginated low-density foci with fluid attenuation in the right kidney each measuring less than 13 mm possibly cysts. Ureters are not dilated. Urinary bladder is not distended.   Stomach/Bowel: Small hiatal hernia is seen. Small bowel loops are not dilated. Appendix is not dilated. There is no significant wall thickening in colon. There is no pericolic stranding.   Vascular/Lymphatic: Unremarkable.   Reproductive: Unremarkable.   Other: Trace amount of free fluid  is seen in the pelvis. This may be physiological. There is no pneumoperitoneum.   Musculoskeletal: No acute findings are seen.   IMPRESSION: There are patchy areas of decreased enhancement in the renal cortex more so in the left kidney suggesting possible acute pyelonephritis.   There is no evidence of intestinal obstruction or pneumoperitoneum. Appendix is not dilated. There is no hydronephrosis.   Enlarged liver. Nodularity in the liver surface suggests cirrhosis. Splenomegaly. Small left renal stone. There are few renal cysts.   Other findings as described in the body of the report.     Current Outpatient Medications on File Prior to Visit  Medication Sig Dispense Refill   ferrous sulfate 325 (65 FE) MG tablet Take 1 tablet (325 mg total) by mouth daily with breakfast. 30 tablet 0   folic acid (FOLVITE) 1 MG tablet Take 1 tablet (1 mg total) by mouth daily. 30 tablet 0   lactulose (CHRONULAC) 10 GM/15ML solution Take 20 g by mouth daily as needed for mild constipation.     Multiple Vitamin (MULTIVITAMIN WITH MINERALS) TABS tablet Take 1 tablet by mouth daily.     pantoprazole (PROTONIX) 40 MG tablet Take 1 tablet (40 mg total) by mouth 2 (two) times daily. 60 tablet 0   rifaximin (XIFAXAN) 550 MG TABS tablet Take 1 tablet (550 mg total) by mouth 2 (two) times daily. 60 tablet 0   thiamine 100 MG tablet Take 1 tablet (100 mg total) by mouth daily. 30 tablet 0   traZODone (DESYREL) 150 MG tablet Take  150 mg by mouth at bedtime.     vitamin B-12 (CYANOCOBALAMIN) 100 MCG tablet Take 100 mcg by mouth daily.     zinc sulfate 220 (50 Zn) MG capsule Take 1 capsule (220 mg total) by mouth daily. 30 capsule 0   No current facility-administered medications on file prior to visit.   Allergies  Allergen Reactions   Latex Hives    Patient stated that she is allergic to latex   Current Medications, Allergies, Past Medical History, Past Surgical History, Family History and Social History were reviewed in [employer] record.  Review of Systems:   Constitutional: Negative for fever, sweats, chills or weight loss.  Respiratory: Negative for shortness of breath.   Cardiovascular: Negative for chest pain, palpitations and leg swelling.  Gastrointestinal: See HPI.  Musculoskeletal: Negative for back pain or muscle aches.  Neurological: Negative for dizziness, headaches or paresthesias.   Physical Exam: Ht 5' 4"  (1.626 m)   Wt 163 lb (73.9 kg)   BMI 27.98 kg/m  Wt Readings from Last 3 Encounters:  02/26/22 163 lb (73.9 kg)  02/23/22 164 lb 3.9 oz (74.5 kg)  01/11/22 160 lb 2 oz (72.6 kg)    General: 41 year old female in no acute distress. Head: Normocephalic and atraumatic. Eyes: No scleral icterus. Conjunctiva pink . Ears: Normal auditory acuity. Mouth: Dentition intact. No ulcers or lesions.  Lungs: Clear throughout to auscultation. Heart: Regular rate and rhythm, no murmur. Abdomen: Soft, nontender and nondistended. Positive hepatomegaly. Normal bowel sounds x 4 quadrants.  No ascites. Rectal: Deferred.  Musculoskeletal: Symmetrical with no gross deformities. Extremities: No edema. Neurological: Alert oriented x 4. No focal deficits. No asterixis.  Psychological: Alert and cooperative. Normal mood and affect  Assessment and Recommendations:  43) 41 year old female with alcohol associated cirrhosis, esophageal varices and hepatic encephalopathy with continued  binge alcohol use, re-admitted to the hospital 6/8 - 3/71/6967 with metabolic/hepatic encephalopathy and alcohol withdrawal, delirium.  GI clearance required prior to admission to [facility] inpatient rehab. Hg 7.3 -> 8.2. PLT 62 -> 76. T. Bili 1.4 -> 1.5. MELD 18.  -Dr. Rush Landmark consulted, patient cleared from GI standpoint regarding admission to [facility] rehab  -Vitamin K 10 mg 1 tab p.o. daily x7 days -Repeat CBC, BMP, hepatic panel and INR with any change in clinical status while in rehab -No alcohol ever -Continue lactulose 20 g twice daily, titrate to 2-3 loose bowel movements daily -Continue Xifaxan 550 mg 1 p.o. twice daily -Continue thiamine 100 mg daily, multivitamin 1 tab daily and folate 1 mg daily -2 g low-sodium diet -Avoid hepatotoxins -No NSAIDs  2) Iron deficiency anemia, likely due to epistaxis and bone marrow suppression secondary to alcohol use.  No overt GI bleeding.  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

## 2022-02-26 NOTE — Patient Instructions (Signed)
We have rescheduled your colonoscopy/Endoscopy for 05/03/2022   You will need to see Carl Best in the office 2 weeks prior to your procedure. No available appointments now please call back to schedule  Vitamin K 10 mg every day for 7 days  NO ALCOHOL  Proceed with [facility] inpatient rehab   If you are age 43 or older, your body mass index should be between 23-30. Your Body mass index is 27.98 kg/m. If this is out of the aforementioned range listed, please consider follow up with your Primary Care Provider.  If you are age 25 or younger, your body mass index should be between 19-25. Your Body mass index is 27.98 kg/m. If this is out of the aformentioned range listed, please consider follow up with your Primary Care Provider.   ________________________________________________________  The [facility] providers would like to encourage you to use [facility] to communicate with providers for non-urgent requests or questions.  Due to long hold times on the telephone, sending your provider a message by [facility] may be a faster and more efficient way to get a response.  Please allow 48 business hours for a response.  Please remember that this is for non-urgent requests.  _______________________________________________________   I appreciate the  opportunity to care for you  Thank You   Carl Best NP

## 2022-02-27 NOTE — Progress Notes (Signed)
Attending Physician's Attestation   I have reviewed the chart.   I agree with the Advanced Practitioner's note, impression, and recommendations with any updates as below. Most critical for this patient will be an opportunity to see if she can get abstinent in the long-term.  This will help decrease her risk of recurrent issues.  We will plan to hold on repeat endoscopic evaluation for now.  Certainly if she has progressive issues while she is in rehab then repeat labs should be drawn.  Agree with active management that is outlined by NP Kennedy-Smith in regards to her chronic liver disease therapies.  We will see her back in a few weeks and wish her the very best at her treatment center.   Justice Britain, MD [facility] Office # [phone]

## 2022-02-28 ENCOUNTER — Telehealth: Payer: Self-pay | Admitting: Nurse Practitioner

## 2022-02-28 NOTE — Telephone Encounter (Signed)
Left message for pt to call back  °

## 2022-03-01 NOTE — Telephone Encounter (Signed)
Left message for pt to call back  °

## 2022-03-02 NOTE — Telephone Encounter (Signed)
Left message for pt to call back  °

## 2022-03-12 ENCOUNTER — Other Ambulatory Visit (HOSPITAL_COMMUNITY): Payer: Self-pay

## 2022-03-12 NOTE — Telephone Encounter (Signed)
Left message for pt to call back  °

## 2022-03-12 NOTE — Telephone Encounter (Signed)
Joann [name], see last office visit plan # 1 as follows: Dr. Rush Landmark consulted, patient cleared from GI standpoint regarding admission to [facility] rehab .  We provided GI clearance into [facility]. Pls contact patient and verify exactly what data was entered which resulted in her being denied admission. Thx

## 2022-03-12 NOTE — Telephone Encounter (Signed)
Pt stated that she was denied [facility] because of what Carl Best NP had wrote in the notes.  Pt questioned if she had any lab work that needed to be done: Chart reviewed. No orders for labs pending: Pt made aware:

## 2022-03-14 NOTE — Telephone Encounter (Signed)
Left message to call back  

## 2022-03-15 NOTE — Telephone Encounter (Signed)
[name], thank you for the update. It is imperative for Joann [name] to proceed with her inpatient alcohol rehab. Pls contact her early next week for further update. I will provide any appropriate info required to facilitate her admission.   Dr. Vevelyn Francois

## 2022-03-15 NOTE — Telephone Encounter (Signed)
Pt contacted to question why she stated that she was denied [facility] treatment [facility]. Pt stated that the was told that the documentation stated that she needed around the clock care. Pt chart was reviewed and pt was notified that this documentation does not state that. Pt was encouraged to call the treatment center back and ask them to review her chart once more. Pt verbalized understanding with all questions answered.

## 2022-03-16 ENCOUNTER — Encounter: Payer: Medicaid Other | Admitting: Gastroenterology

## 2022-03-19 NOTE — Telephone Encounter (Signed)
Pt stated that she did contact [facility] again. Pt stated that they stated that they  were not going to change there mind about acceptance.  Pt stated that she is currently doing good. Pt was questioned about sobriety:  Pt stated that she has been sober for over 4 weeks now:

## 2022-03-19 NOTE — Telephone Encounter (Signed)
Dr. Rush Landmark, pls review prior messages. I truly think she needs inpatient rehab to successfully reman abstinent from alcohol. Pls let me know if you have any other recommendations. THX

## 2022-04-16 ENCOUNTER — Inpatient Hospital Stay (HOSPITAL_COMMUNITY)
Admission: EM | Admit: 2022-04-16 | Discharge: 2022-04-17 | DRG: 377 | Discharge status: Against Medical Advice | Payer: Medicaid Other | Attending: Internal Medicine | Admitting: Internal Medicine

## 2022-04-16 ENCOUNTER — Emergency Department (HOSPITAL_COMMUNITY): Payer: Medicaid Other

## 2022-04-16 DIAGNOSIS — R7401 Elevation of levels of liver transaminase levels: Secondary | ICD-10-CM | POA: Diagnosis present

## 2022-04-16 DIAGNOSIS — R739 Hyperglycemia, unspecified: Secondary | ICD-10-CM | POA: Diagnosis present

## 2022-04-16 DIAGNOSIS — E43 Unspecified severe protein-calorie malnutrition: Secondary | ICD-10-CM | POA: Diagnosis present

## 2022-04-16 DIAGNOSIS — E876 Hypokalemia: Secondary | ICD-10-CM | POA: Diagnosis present

## 2022-04-16 DIAGNOSIS — D509 Iron deficiency anemia, unspecified: Secondary | ICD-10-CM | POA: Diagnosis present

## 2022-04-16 DIAGNOSIS — F10120 Alcohol abuse with intoxication, uncomplicated: Secondary | ICD-10-CM | POA: Diagnosis present

## 2022-04-16 DIAGNOSIS — I10 Essential (primary) hypertension: Secondary | ICD-10-CM | POA: Diagnosis present

## 2022-04-16 DIAGNOSIS — Y908 Blood alcohol level of 240 mg/100 ml or more: Secondary | ICD-10-CM | POA: Diagnosis present

## 2022-04-16 DIAGNOSIS — F1010 Alcohol abuse, uncomplicated: Secondary | ICD-10-CM | POA: Diagnosis not present

## 2022-04-16 DIAGNOSIS — K2970 Gastritis, unspecified, without bleeding: Secondary | ICD-10-CM

## 2022-04-16 DIAGNOSIS — K7682 Hepatic encephalopathy: Secondary | ICD-10-CM | POA: Diagnosis present

## 2022-04-16 DIAGNOSIS — F419 Anxiety disorder, unspecified: Secondary | ICD-10-CM | POA: Diagnosis present

## 2022-04-16 DIAGNOSIS — Z6824 Body mass index (BMI) 24.0-24.9, adult: Secondary | ICD-10-CM

## 2022-04-16 DIAGNOSIS — Z811 Family history of alcohol abuse and dependence: Secondary | ICD-10-CM | POA: Diagnosis not present

## 2022-04-16 DIAGNOSIS — K7030 Alcoholic cirrhosis of liver without ascites: Secondary | ICD-10-CM | POA: Diagnosis present

## 2022-04-16 DIAGNOSIS — Z79899 Other long term (current) drug therapy: Secondary | ICD-10-CM

## 2022-04-16 DIAGNOSIS — E878 Other disorders of electrolyte and fluid balance, not elsewhere classified: Secondary | ICD-10-CM | POA: Diagnosis present

## 2022-04-16 DIAGNOSIS — Z91040 Latex allergy status: Secondary | ICD-10-CM

## 2022-04-16 DIAGNOSIS — Z8249 Family history of ischemic heart disease and other diseases of the circulatory system: Secondary | ICD-10-CM | POA: Diagnosis not present

## 2022-04-16 DIAGNOSIS — D696 Thrombocytopenia, unspecified: Secondary | ICD-10-CM | POA: Diagnosis present

## 2022-04-16 DIAGNOSIS — K2921 Alcoholic gastritis with bleeding: Secondary | ICD-10-CM | POA: Diagnosis present

## 2022-04-16 DIAGNOSIS — D689 Coagulation defect, unspecified: Secondary | ICD-10-CM | POA: Diagnosis present

## 2022-04-16 DIAGNOSIS — R040 Epistaxis: Secondary | ICD-10-CM | POA: Diagnosis present

## 2022-04-16 DIAGNOSIS — F10920 Alcohol use, unspecified with intoxication, uncomplicated: Principal | ICD-10-CM

## 2022-04-16 LAB — COMPREHENSIVE METABOLIC PANEL
ALT: 12 U/L (ref 0–44)
ALT: 11 U/L (ref 0–44)
AST: 71 U/L — ABNORMAL HIGH (ref 15–41)
AST: 73 U/L — ABNORMAL HIGH (ref 15–41)
Albumin: 3.1 g/dL — ABNORMAL LOW (ref 3.5–5.0)
Albumin: 2.8 g/dL — ABNORMAL LOW (ref 3.5–5.0)
Alkaline Phosphatase: 147 U/L — ABNORMAL HIGH (ref 38–126)
Alkaline Phosphatase: 133 U/L — ABNORMAL HIGH (ref 38–126)
Anion gap: 12 (ref 5–15)
Anion gap: 13 (ref 5–15)
BUN: <5 mg/dL — ABNORMAL LOW (ref 6–20)
BUN: <5 mg/dL — ABNORMAL LOW (ref 6–20)
CO2: 17 mmol/L — ABNORMAL LOW (ref 22–32)
CO2: 17 mmol/L — ABNORMAL LOW (ref 22–32)
Calcium: 8.2 mg/dL — ABNORMAL LOW (ref 8.9–10.3)
Calcium: 7.4 mg/dL — ABNORMAL LOW (ref 8.9–10.3)
Chloride: 116 mmol/L — ABNORMAL HIGH (ref 98–111)
Chloride: 110 mmol/L (ref 98–111)
Creatinine, Ser: 0.49 mg/dL (ref 0.44–1.00)
Creatinine, Ser: 0.57 mg/dL (ref 0.44–1.00)
GFR, Estimated: >60 mL/min (ref >60)
GFR, Estimated: >60 mL/min (ref >60)
Glucose, Bld: 100 mg/dL — ABNORMAL HIGH (ref 70–99)
Glucose, Bld: 201 mg/dL — ABNORMAL HIGH (ref 70–99)
Potassium: 3 mmol/L — ABNORMAL LOW (ref 3.5–5.1)
Potassium: 3 mmol/L — ABNORMAL LOW (ref 3.5–5.1)
Sodium: 145 mmol/L (ref 135–145)
Sodium: 140 mmol/L (ref 135–145)
Total Bilirubin: 1.1 mg/dL (ref 0.3–1.2)
Total Bilirubin: 1.3 mg/dL — ABNORMAL HIGH (ref 0.3–1.2)
Total Protein: 8.9 g/dL — ABNORMAL HIGH (ref 6.5–8.1)
Total Protein: 8 g/dL (ref 6.5–8.1)

## 2022-04-16 LAB — URINALYSIS, ROUTINE W REFLEX MICROSCOPIC
Bilirubin Urine: NEGATIVE
Glucose, UA: NEGATIVE mg/dL
Hgb urine dipstick: NEGATIVE
Ketones, ur: NEGATIVE mg/dL
Leukocytes,Ua: NEGATIVE
Nitrite: NEGATIVE
Protein, ur: NEGATIVE mg/dL
Specific Gravity, Urine: 1.011 (ref 1.005–1.030)
pH: 6 (ref 5.0–8.0)

## 2022-04-16 LAB — CBC WITH DIFFERENTIAL/PLATELET
Abs Immature Granulocytes: 0.01 10*3/uL (ref 0.00–0.07)
Basophils Absolute: 0 10*3/uL (ref 0.0–0.1)
Basophils Relative: 0 %
Eosinophils Absolute: 0 10*3/uL (ref 0.0–0.5)
Eosinophils Relative: 1 %
HCT: 30.2 % — ABNORMAL LOW (ref 36.0–46.0)
Hemoglobin: 8.3 g/dL — ABNORMAL LOW (ref 12.0–15.0)
Immature Granulocytes: 0 %
Lymphocytes Relative: 51 %
Lymphs Abs: 2.2 10*3/uL (ref 0.7–4.0)
MCH: 21.3 pg — ABNORMAL LOW (ref 26.0–34.0)
MCHC: 27.5 g/dL — ABNORMAL LOW (ref 30.0–36.0)
MCV: 77.6 fL — ABNORMAL LOW (ref 80.0–100.0)
Monocytes Absolute: 0.2 10*3/uL (ref 0.1–1.0)
Monocytes Relative: 4 %
Neutro Abs: 1.9 10*3/uL (ref 1.7–7.7)
Neutrophils Relative %: 44 %
Platelets: 62 10*3/uL — ABNORMAL LOW (ref 150–400)
RBC: 3.89 MIL/uL (ref 3.87–5.11)
RDW: 20.5 % — ABNORMAL HIGH (ref 11.5–15.5)
WBC: 4.4 10*3/uL (ref 4.0–10.5)
nRBC: 0 % (ref 0.0–0.2)

## 2022-04-16 LAB — RAPID URINE DRUG SCREEN, HOSP PERFORMED
Amphetamines: NOT DETECTED
Barbiturates: NOT DETECTED
Benzodiazepines: NOT DETECTED
Cocaine: NOT DETECTED
Opiates: NOT DETECTED
Tetrahydrocannabinol: NOT DETECTED

## 2022-04-16 LAB — ETHANOL
Alcohol, Ethyl (B): 424 mg/dL (ref <10)
Alcohol, Ethyl (B): 332 mg/dL (ref <10)

## 2022-04-16 LAB — RETICULOCYTES
Immature Retic Fract: 20 % — ABNORMAL HIGH (ref 2.3–15.9)
RBC.: 3.7 MIL/uL — ABNORMAL LOW (ref 3.87–5.11)
Retic Count, Absolute: 46.6 10*3/uL (ref 19.0–186.0)
Retic Ct Pct: 1.3 % (ref 0.4–3.1)

## 2022-04-16 LAB — LIPASE, BLOOD: Lipase: 44 U/L (ref 11–51)

## 2022-04-16 LAB — PROTIME-INR
INR: 1.5 — ABNORMAL HIGH (ref 0.8–1.2)
Prothrombin Time: 17.5 seconds — ABNORMAL HIGH (ref 11.4–15.2)

## 2022-04-16 LAB — POC OCCULT BLOOD, ED: Fecal Occult Bld: NEGATIVE

## 2022-04-16 LAB — HEMOGLOBIN AND HEMATOCRIT, BLOOD
HCT: 29.7 % — ABNORMAL LOW (ref 36.0–46.0)
HCT: 27.8 % — ABNORMAL LOW (ref 36.0–46.0)
Hemoglobin: 7.8 g/dL — ABNORMAL LOW (ref 12.0–15.0)
Hemoglobin: 7.5 g/dL — ABNORMAL LOW (ref 12.0–15.0)

## 2022-04-16 LAB — IRON AND TIBC
Iron: 39 ug/dL (ref 28–170)
Saturation Ratios: 9 % — ABNORMAL LOW (ref 10.4–31.8)
TIBC: 434 ug/dL (ref 250–450)
UIBC: 395 ug/dL

## 2022-04-16 LAB — FOLATE: Folate: 11 ng/mL (ref >5.9)

## 2022-04-16 LAB — CBG MONITORING, ED: Glucose-Capillary: 115 mg/dL — ABNORMAL HIGH (ref 70–99)

## 2022-04-16 LAB — AMMONIA
Ammonia: 93 umol/L — ABNORMAL HIGH (ref 9–35)
Ammonia: 79 umol/L — ABNORMAL HIGH (ref 9–35)

## 2022-04-16 LAB — VITAMIN B12: Vitamin B-12: 580 pg/mL (ref 180–914)

## 2022-04-16 LAB — TYPE AND SCREEN
ABO/RH(D): O POS
Antibody Screen: NEGATIVE

## 2022-04-16 LAB — FERRITIN: Ferritin: 8 ng/mL — ABNORMAL LOW (ref 11–307)

## 2022-04-16 MED ORDER — LORAZEPAM 2 MG/ML IJ SOLN
1.0000 mg | INTRAMUSCULAR | Status: DC | PRN
  Administered 2022-04-16: 2 mg via INTRAVENOUS
  Administered 2022-04-17: 1 mg via INTRAVENOUS
  Filled 2022-04-16 (×2): qty 1

## 2022-04-16 MED ORDER — THIAMINE HCL 100 MG/ML IJ SOLN: 100.0000 mg | Freq: Every day | INTRAMUSCULAR | Status: DC

## 2022-04-16 MED ORDER — PANTOPRAZOLE SODIUM 40 MG IV SOLR
40.0000 mg | Freq: Once | INTRAVENOUS | Status: AC
Start: 2022-04-16 — End: 2022-04-16
  Administered 2022-04-16: 40 mg via INTRAVENOUS
  Filled 2022-04-16: qty 10

## 2022-04-16 MED ORDER — PANTOPRAZOLE SODIUM 40 MG IV SOLR
40.0000 mg | Freq: Two times a day (BID) | INTRAVENOUS | Status: DC
  Administered 2022-04-16 – 2022-04-17 (×3): 40 mg via INTRAVENOUS
  Filled 2022-04-16 (×3): qty 10

## 2022-04-16 MED ORDER — MAGNESIUM SULFATE 2 GM/50ML IV SOLN
2.0000 g | Freq: Once | INTRAVENOUS | Status: AC
  Administered 2022-04-16: 2 g via INTRAVENOUS
  Filled 2022-04-16: qty 50

## 2022-04-16 MED ORDER — ACETAMINOPHEN 650 MG RE SUPP: 650.0000 mg | Freq: Four times a day (QID) | RECTAL | Status: DC | PRN

## 2022-04-16 MED ORDER — THIAMINE HCL 100 MG/ML IJ SOLN
500.0000 mg | Freq: Once | INTRAMUSCULAR | Status: AC
  Administered 2022-04-16: 500 mg via INTRAVENOUS
  Filled 2022-04-16: qty 6

## 2022-04-16 MED ORDER — ONDANSETRON HCL 4 MG PO TABS: 4.0000 mg | ORAL_TABLET | Freq: Four times a day (QID) | ORAL | Status: DC | PRN

## 2022-04-16 MED ORDER — FOLIC ACID 1 MG PO TABS
1.0000 mg | ORAL_TABLET | Freq: Every day | ORAL | Status: DC
  Administered 2022-04-16 – 2022-04-17 (×2): 1 mg via ORAL
  Filled 2022-04-16 (×2): qty 1

## 2022-04-16 MED ORDER — ONDANSETRON HCL 4 MG/2ML IJ SOLN: 4.0000 mg | Freq: Four times a day (QID) | INTRAMUSCULAR | Status: DC | PRN

## 2022-04-16 MED ORDER — LACTULOSE 10 GM/15ML PO SOLN
30.0000 g | Freq: Once | ORAL | Status: AC
  Administered 2022-04-16: 30 g via ORAL
  Filled 2022-04-16: qty 60

## 2022-04-16 MED ORDER — THIAMINE HCL 100 MG PO TABS
100.0000 mg | ORAL_TABLET | Freq: Every day | ORAL | Status: DC
  Administered 2022-04-17: 100 mg via ORAL
  Filled 2022-04-16 (×2): qty 1

## 2022-04-16 MED ORDER — ALBUTEROL SULFATE (2.5 MG/3ML) 0.083% IN NEBU: 2.5000 mg | INHALATION_SOLUTION | RESPIRATORY_TRACT | Status: DC | PRN

## 2022-04-16 MED ORDER — LACTULOSE 10 GM/15ML PO SOLN: 20.0000 g | Freq: Every day | ORAL | Status: DC | PRN

## 2022-04-16 MED ORDER — ACETAMINOPHEN 325 MG PO TABS: 650.0000 mg | ORAL_TABLET | Freq: Four times a day (QID) | ORAL | Status: DC | PRN

## 2022-04-16 MED ORDER — POTASSIUM CHLORIDE CRYS ER 20 MEQ PO TBCR
40.0000 meq | EXTENDED_RELEASE_TABLET | Freq: Once | ORAL | Status: AC
Start: 2022-04-16 — End: 2022-04-16
  Administered 2022-04-16: 40 meq via ORAL
  Filled 2022-04-16: qty 2

## 2022-04-16 MED ORDER — RIFAXIMIN 550 MG PO TABS
550.0000 mg | ORAL_TABLET | Freq: Two times a day (BID) | ORAL | Status: DC
  Administered 2022-04-16 – 2022-04-17 (×3): 550 mg via ORAL
  Filled 2022-04-16 (×3): qty 1

## 2022-04-16 MED ORDER — SODIUM CHLORIDE 0.9 % IV BOLUS
1000.0000 mL | Freq: Once | INTRAVENOUS | Status: AC
  Administered 2022-04-16: 1000 mL via INTRAVENOUS

## 2022-04-16 MED ORDER — SODIUM CHLORIDE 0.9 % IV SOLN: INTRAVENOUS | Status: AC

## 2022-04-16 MED ORDER — ADULT MULTIVITAMIN W/MINERALS CH
1.0000 | ORAL_TABLET | Freq: Every day | ORAL | Status: DC
  Administered 2022-04-16 – 2022-04-17 (×2): 1 via ORAL
  Filled 2022-04-16 (×2): qty 1

## 2022-04-16 MED ORDER — LORAZEPAM 1 MG PO TABS
1.0000 mg | ORAL_TABLET | ORAL | Status: DC | PRN
  Administered 2022-04-16 – 2022-04-17 (×2): 1 mg via ORAL
  Filled 2022-04-16 (×2): qty 1

## 2022-04-16 MED ORDER — ONDANSETRON HCL 4 MG/2ML IJ SOLN
4.0000 mg | Freq: Once | INTRAMUSCULAR | Status: AC
  Administered 2022-04-16: 4 mg via INTRAVENOUS
  Filled 2022-04-16: qty 2

## 2022-04-16 NOTE — H&P (Signed)
History and Physical    Joann [name] MPN:[id number] DOB: 03/12/1981 DOA: 04/16/2022  PCP: Pcp, No  Patient coming from: home  I have personally briefly reviewed patient's old medical records in [place]  Chief Complaint: binge drinking  wants to detox                                 Black stools  HPI: Joann [name] is a 41 y.o. female with medical history significant of alcohol abuse, alcoholic cirrhosis, hepatic encephalopathy,HTN, Anxiety, history of severe with with DT/SZ who presents to ED s/p binge drinking over the last 24-48 hours. Patient states she drank close to 2 gallon of vodka. She states she wants to stop drinking. She notes that after binge drinking she started to experience n/v/ and abdominal pain. She endorse nose bleed as well as hematemesis and black stools that began today. She denies si or hi, she states the last time she was in inpatient rehab was around 6 months ago. She notes no fever/ chills/chest pain / sob / cough / dysuria.    ED Course:  Temp97.3, hr 102, rr 15, sat 99% on ra  Labs: wbc 4.4, hgb 8.3 at baseline, mcv 77.6, plt 62 Ammonia 93 Na 145, K 3, cr 0.49 AST71 , alk 147 ETOH 424 KUB IMPRESSION: 1. No free intra-abdominal air or evidence of bowel perforation. 2. Mild gaseous distension of ascending and transverse colon without evidence of obstruction or ileus. 3. No acute chest findings. CT head: NAD UDS : neg UA:neg Lactulose  Tx mag 2grams, thiamine ns 1L Review of Systems: As per HPI otherwise 10 point review of systems negative.   Past Medical History:  Diagnosis Date   [redacted] weeks gestation of pregnancy 03/22/2020   Alcohol abuse    Alcohol withdrawal delirium ([place]) 02/15/2022   Alcohol-induced acute pancreatitis    Alcoholic ketoacidosis 00/86/7619   Anxiety    DTs (delirium tremens) ([place])    Elevated LFTs 10/24/2018   Encephalopathy acute    Hepatic encephalopathy ([place]) 12/24/2019   Hypertension    Hypokalemia 04/02/2019    Hypotension 02/24/2022   Liver disease     Past Surgical History:  Procedure Laterality Date   ECTOPIC PREGNANCY SURGERY     ESOPHAGOGASTRODUODENOSCOPY (EGD) WITH PROPOFOL N/A 12/27/2019   Procedure: ESOPHAGOGASTRODUODENOSCOPY (EGD) WITH PROPOFOL;  Surgeon: Carol Ada, MD;  Location: [facility];  Service: Endoscopy;  Laterality: N/A;     reports that she has never smoked. She has never used smokeless tobacco. She reports current alcohol use. She reports that she does not use drugs.  Allergies  Allergen Reactions   Latex Hives    Patient stated that she is allergic to latex    Family History  Problem Relation Age of Onset   Healthy Mother    Alcohol abuse Mother        quit in her 33s   Alcohol abuse Father    Diabetes Father    Healthy Brother    Hypertension Maternal Grandmother    Hypertension Maternal Grandfather    Healthy Brother    Healthy Brother     Prior to Admission medications   Medication Sig Start Date End Date Taking? Authorizing Provider  ferrous sulfate 325 (65 FE) MG tablet Take 1 tablet (325 mg total) by mouth daily with breakfast. 02/26/22 03/28/22  Donne Hazel, MD  folic acid (FOLVITE) 1 MG tablet Take  1 tablet (1 mg total) by mouth daily. 12/22/21   France Ravens, MD  lactulose [facility]) 10 GM/15ML solution Take 20 g by mouth daily as needed for mild constipation.    [provider]  Multiple Vitamin (MULTIVITAMIN WITH MINERALS) TABS tablet Take 1 tablet by mouth daily. 01/15/20   Angiulli, Lavon Paganini, PA-C  pantoprazole (PROTONIX) 40 MG tablet Take 1 tablet (40 mg total) by mouth 2 (two) times daily. 12/22/21   Erskine Emery, MD  phytonadione (VITAMIN K) 5 MG tablet Take 2 tablets (10 mg total) by mouth daily. 02/26/22   Mansouraty, Telford Nab., MD  rifaximin (XIFAXAN) 550 MG TABS tablet Take 1 tablet (550 mg total) by mouth 2 (two) times daily. 12/22/21   Erskine Emery, MD  traZODone (DESYREL) 150 MG tablet Take 150 mg by mouth at bedtime.  01/11/22   [provider]  vitamin B-12 (CYANOCOBALAMIN) 100 MCG tablet Take 100 mcg by mouth daily.    [provider]  zinc sulfate 220 (50 Zn) MG capsule Take 1 capsule (220 mg total) by mouth daily. 01/15/20   Cathlyn Parsons, PA-C    Physical Exam: Vitals:   04/16/22 0118 04/16/22 0119  BP: 129/86   Pulse: (!) 102   Resp: 15   Temp: (!) 97.3 F (36.3 C)   TempSrc: Oral   SpO2: 99%   Weight:  63.5 kg  Height:  5' 4"  (1.626 m)     Vitals:   04/16/22 0118 04/16/22 0119  BP: 129/86   Pulse: (!) 102   Resp: 15   Temp: (!) 97.3 F (36.3 C)   TempSrc: Oral   SpO2: 99%   Weight:  63.5 kg  Height:  5' 4"  (1.626 m)  Constitutional: NAD, calm, comfortable Eyes: PERRL, lids and conjunctivae normal ENMT: Mucous membranes are moist. Posterior pharynx clear of any exudate or lesions.Normal dentition.  Neck: normal, supple, no masses, no thyromegaly Respiratory: clear to auscultation bilaterally, no wheezing, no crackles. Normal respiratory effort. No accessory muscle use.  Cardiovascular: Regular rate and rhythm, no murmurs / rubs / gallops. No extremity edema. 2+ pedal pulses. No carotid bruits.  Abdomen: +tenderness RUQ/epigastric area, no masses palpated. No hepatosplenomegaly. Bowel sounds positive.  Musculoskeletal: no clubbing / cyanosis. No joint deformity upper and lower extremities. Good ROM, no contractures. Normal muscle tone.  Skin: no rashes, lesions, ulcers. No induration Neurologic: CN 2-12 grossly intact. Sensation intact, Strength 5/5 in all 4.  Psychiatric: Normal judgment and insight. Alert and oriented x 3. Normal mood.    Labs on Admission: I have personally reviewed following labs and imaging studies  CBC: Recent Labs  Lab 04/16/22 0232  WBC 4.4  NEUTROABS 1.9  HGB 8.3*  HCT 30.2*  MCV 77.6*  PLT 62*   Basic Metabolic Panel: Recent Labs  Lab 04/16/22 0232  NA 145  K 3.0*  CL 116*  CO2 17*  GLUCOSE 100*  BUN <5*   CREATININE 0.49  CALCIUM 8.2*   GFR: Estimated Creatinine Clearance: 79.1 mL/min (by C-G formula based on SCr of 0.49 mg/dL). Liver Function Tests: Recent Labs  Lab 04/16/22 0232  AST 71*  ALT 12  ALKPHOS 147*  BILITOT 1.1  PROT 8.9*  ALBUMIN 3.1*   Recent Labs  Lab 04/16/22 0232  LIPASE 44   Recent Labs  Lab 04/16/22 0232  AMMONIA 93*   Coagulation Profile: No results for input(s): "INR", "PROTIME" in the last 168 hours. Cardiac Enzymes: No results for input(s): "CKTOTAL", "CKMB", "  CKMBINDEX", "TROPONINI" in the last 168 hours. BNP (last 3 results) No results for input(s): "PROBNP" in the last 8760 hours. HbA1C: No results for input(s): "HGBA1C" in the last 72 hours. CBG: No results for input(s): "GLUCAP" in the last 168 hours. Lipid Profile: No results for input(s): "CHOL", "HDL", "LDLCALC", "TRIG", "CHOLHDL", "LDLDIRECT" in the last 72 hours. Thyroid Function Tests: No results for input(s): "TSH", "T4TOTAL", "FREET4", "T3FREE", "THYROIDAB" in the last 72 hours. Anemia Panel: No results for input(s): "VITAMINB12", "FOLATE", "FERRITIN", "TIBC", "IRON", "RETICCTPCT" in the last 72 hours. Urine analysis:    Component Value Date/Time   COLORURINE YELLOW 02/24/2022 1119   APPEARANCEUR HAZY (A) 02/24/2022 1119   LABSPEC 1.008 02/24/2022 1119   PHURINE 8.0 02/24/2022 1119   GLUCOSEU NEGATIVE 02/24/2022 1119   HGBUR SMALL (A) 02/24/2022 1119   BILIRUBINUR NEGATIVE 02/24/2022 1119   KETONESUR NEGATIVE 02/24/2022 1119   PROTEINUR NEGATIVE 02/24/2022 1119   NITRITE NEGATIVE 02/24/2022 1119   LEUKOCYTESUR NEGATIVE 02/24/2022 1119    Radiological Exams on Admission: CT Head Wo Contrast  Result Date: 04/16/2022 CLINICAL DATA:  Mental status change, unknown cause. EXAM: CT HEAD WITHOUT CONTRAST TECHNIQUE: Contiguous axial images were obtained from the base of the skull through the vertex without intravenous contrast. RADIATION DOSE REDUCTION: This exam was performed  according to the departmental dose-optimization program which includes automated exposure control, adjustment of the mA and/or kV according to patient size and/or use of iterative reconstruction technique. COMPARISON:  02/15/2022. FINDINGS: Brain: No acute intracranial hemorrhage, midline shift or mass effect. No extra-axial fluid collection. Gray-white matter differentiation is within normal limits. Mild generalized atrophy is noted. No hydrocephalus. Vascular: No hyperdense vessel or unexpected calcification. Skull: Normal. Negative for fracture or focal lesion. Sinuses/Orbits: No acute finding. Other: None. IMPRESSION: No acute intracranial process. Electronically Signed   By: Brett Fairy M.D.   On: 04/16/2022 04:09   DG Abdomen Acute W/Chest  Result Date: 04/16/2022 CLINICAL DATA:  Abdominal pain.  Concern for ruptured gastric ulcer. EXAM: DG ABDOMEN ACUTE WITH 1 VIEW CHEST COMPARISON:  CT 02/24/2022 FINDINGS: The cardiomediastinal contours are normal. The lungs are clear. No pneumomediastinum. There is no free intra-abdominal air. No dilated bowel loops to suggest obstruction. Mild gaseous distension of ascending and transverse colon, with formed stool in the distal colon. No abnormal rectal distention. Cholecystectomy clips in the right upper quadrant. Left pelvic calcification is a phlebolith. No radiopaque calculi. No acute osseous abnormalities are seen. IMPRESSION: 1. No free intra-abdominal air or evidence of bowel perforation. 2. Mild gaseous distension of ascending and transverse colon without evidence of obstruction or ileus. 3. No acute chest findings. Electronically Signed   By: Keith Rake M.D.   On: 04/16/2022 03:06    EKG: Independently reviewed. pending  Assessment/Plan  Severe ETOH abuse  -characterized by episodes of binge drinking  - hx of severe w/d DT/SZ -admit to progressive care , place on ciwa  -mvi/thiamine/folate  -sw to follow for substance abuse   ?ETOH  gastritis  -patient states had black stools, nose bleed and blood in emesis -h/h stable , fob neg -place on ppi bid  -clears advance as tolerated  -trend h/h   Hepatic Encephalopathy -elevated ammonia  -s/p lacutolse in ED -pt currently appears to be at baseline  -resume lactulose and rifaximin  Anxiety Resume home regimen once med rec completed   Thrombocytopenia -due to etoh liver disease -monitor counts  DVT prophylaxis: scd Code Status: full Family Communication: none at bedside Disposition  Plan: patient  expected to be admitted greater than 2 midnights  Consults called: n/a Admission status: progressive Clance Boll MD [facility]   If 7PM-7AM, please contact night-coverage [email or web address] Password [facility]  04/16/2022, 4:27 AM

## 2022-04-16 NOTE — ED Notes (Signed)
Pt ambulated to and from bathroom without assistance

## 2022-04-16 NOTE — ED Triage Notes (Signed)
Patient BIB EMS patient states that she called 911 because she "wants to get him from alcohol" patient reports drinking "gallon of absolute"

## 2022-04-16 NOTE — Progress Notes (Signed)
PROGRESS NOTE    Joann [name]  HOZ:[id number] DOB: Jul 27, 1981 DOA: 04/16/2022 PCP: Pcp, No   Brief Narrative:   Admitted earlier this AM:  Joann [name] is a 41 y.o. female with medical history significant of alcohol abuse, alcoholic cirrhosis, hepatic encephalopathy,HTN, Anxiety, history of severe with with DT/SZ who presents to ED s/p binge drinking over the last 24-48 hours. Patient states she drank close to 2 gallon of vodka. She states she wants to stop drinking. She notes that after binge drinking she started to experience n/v/ and abdominal pain. She endorse nose bleed as well as hematemesis and black stools that began today. She denies si or hi, she states the last time she was in inpatient rehab was around 6 months ago. She notes no fever/ chills/chest pain / sob / cough / dysuria.      Assessment & Plan:   Principal Problem:   ETOH abuse   Severe ETOH abuse  -characterized by episodes of binge drinking  - hx of severe w/d DT/SZ -admit to progressive care , place on ciwa  -mvi/thiamine/folate  -sw to follow for substance abuse    ?ETOH gastritis  -patient states had black stools, nose bleed and blood in emesis -h/h stable , fob neg -Cont on ppi bid  -clears advance as tolerated  -trend h/h    Hepatic Encephalopathy -elevated ammonia, recheck in AM -s/p lacutolse in ED -pt currently appears to be at baseline  -resume lactulose and rifaximin   Anxiety -Supporive care   Thrombocytopenia -due to etoh liver disease -monitor counts   DVT prophylaxis: scd Code Status: full    Code Status Orders  (From admission, onward)           Start     Ordered   04/16/22 0608  Full code  Continuous        04/16/22 0612           Code Status History     Date Active Date Inactive Code Status Order ID Comments User Context   02/16/2022 0324 02/25/2022 1806 Full Code [id number]  Kristopher Oppenheim, DO ED   02/15/2022 1527 02/16/2022 0324 Full Code [id number]  Carmin Muskrat, MD  ED   12/17/2021 2009 12/22/2021 2053 Full Code [id number]  Eppie Gibson, MD ED   01/08/2020 1517 01/16/2020 1334 Partial Code [id number]  Cathlyn Parsons, PA-C Inpatient   01/08/2020 1517 01/08/2020 1517 Full Code [id number]  Cathlyn Parsons, PA-C Inpatient   01/02/2020 1213 01/08/2020 1502 Partial Code [id number]  Fuller Canada, PA-C Inpatient   12/24/2019 0902 01/02/2020 1213 Full Code [id number]  Karmen Bongo, MD ED   12/24/2019 0053 12/24/2019 0902 Full Code [id number]  McDonald, Mia A, PA-C ED   04/02/2019 1731 04/03/2019 2115 Full Code [id number]  Epifanio Lesches, MD Inpatient   04/02/2019 0906 04/02/2019 1731 Full Code [id number]  Vanessa Sherwood, MD ED   10/24/2018 1459 10/26/2018 1554 Full Code [id number]  Karmen Bongo, MD ED   08/28/2018 1324 09/01/2018 1353 Full Code [id number]  Lars Mage, MD ED   09/14/2017 0608 09/15/2017 1233 Full Code [id number]  Delora Fuel, MD ED      Family Communication: discussed with patient in Er at bedside  Disposition Plan:   not stable for d/c with elev amm, risk of detox Consults called: None Admission status: Inpatient   Consultants:  None  Procedures:  CT Head Wo Contrast  Result Date: 04/16/2022 CLINICAL DATA:  Mental status change, unknown cause. EXAM: CT  HEAD WITHOUT CONTRAST TECHNIQUE: Contiguous axial images were obtained from the base of the skull through the vertex without intravenous contrast. RADIATION DOSE REDUCTION: This exam was performed according to the departmental dose-optimization program which includes automated exposure control, adjustment of the mA and/or kV according to patient size and/or use of iterative reconstruction technique. COMPARISON:  02/15/2022. FINDINGS: Brain: No acute intracranial hemorrhage, midline shift or mass effect. No extra-axial fluid collection. Gray-white matter differentiation is within normal limits. Mild generalized atrophy is noted. No hydrocephalus. Vascular: No hyperdense vessel or unexpected  calcification. Skull: Normal. Negative for fracture or focal lesion. Sinuses/Orbits: No acute finding. Other: None. IMPRESSION: No acute intracranial process. Electronically Signed   By: Brett Fairy M.D.   On: 04/16/2022 04:09   DG Abdomen Acute W/Chest  Result Date: 04/16/2022 CLINICAL DATA:  Abdominal pain.  Concern for ruptured gastric ulcer. EXAM: DG ABDOMEN ACUTE WITH 1 VIEW CHEST COMPARISON:  CT 02/24/2022 FINDINGS: The cardiomediastinal contours are normal. The lungs are clear. No pneumomediastinum. There is no free intra-abdominal air. No dilated bowel loops to suggest obstruction. Mild gaseous distension of ascending and transverse colon, with formed stool in the distal colon. No abnormal rectal distention. Cholecystectomy clips in the right upper quadrant. Left pelvic calcification is a phlebolith. No radiopaque calculi. No acute osseous abnormalities are seen. IMPRESSION: 1. No free intra-abdominal air or evidence of bowel perforation. 2. Mild gaseous distension of ascending and transverse colon without evidence of obstruction or ileus. 3. No acute chest findings. Electronically Signed   By: Keith Rake M.D.   On: 04/16/2022 03:06    Antimicrobials:  rifaximin    Subjective: Seen in Er, relapsed with etoh  Objective: Vitals:   04/16/22 1215 04/16/22 1230 04/16/22 1243 04/16/22 1245  BP:   123/88 123/88  Pulse:   (!) 136 (!) 136  Resp: 16 12  18   Temp:      TempSrc:      SpO2:    100%  Weight:      Height:       No intake or output data in the 24 hours ending 04/16/22 1458 Filed Weights   04/16/22 0119  Weight: 63.5 kg    Examination:  General exam: Appears calm and comfortable  Respiratory system: Clear to auscultation. Respiratory effort normal. Cardiovascular system: S1 & S2 heard, RRR. No JVD, murmurs, rubs, gallops or clicks. No pedal edema. Gastrointestinal system: Abdomen is nondistended, soft and nontender. No organomegaly or masses felt. Normal bowel  sounds heard. Central nervous system: Alert and oriented. No focal neurological deficits. Extremities: wwp,  Skin: No rashes, lesions or ulcers Psychiatry: Judgement and insight impaired. Mood & affect flat    Data Reviewed: I have personally reviewed following labs and imaging studies  CBC: Recent Labs  Lab 04/16/22 0232 04/16/22 0625 04/16/22 1345  WBC 4.4  --   --   NEUTROABS 1.9  --   --   HGB 8.3* 7.8* 7.5*  HCT 30.2* 29.7* 27.8*  MCV 77.6*  --   --   PLT 62*  --   --    Basic Metabolic Panel: Recent Labs  Lab 04/16/22 0232  NA 145  K 3.0*  CL 116*  CO2 17*  GLUCOSE 100*  BUN <5*  CREATININE 0.49  CALCIUM 8.2*   GFR: Estimated Creatinine Clearance: 79.1 mL/min (by C-G formula based on SCr of 0.49 mg/dL). Liver Function Tests: Recent Labs  Lab 04/16/22 0232  AST 71*  ALT 12  ALKPHOS 147*  BILITOT 1.1  PROT 8.9*  ALBUMIN 3.1*   Recent Labs  Lab 04/16/22 0232  LIPASE 44   Recent Labs  Lab 04/16/22 0232 04/16/22 0625  AMMONIA 93* 79*   Coagulation Profile: Recent Labs  Lab 04/16/22 1345  INR 1.5*   Cardiac Enzymes: No results for input(s): "CKTOTAL", "CKMB", "CKMBINDEX", "TROPONINI" in the last 168 hours. BNP (last 3 results) No results for input(s): "PROBNP" in the last 8760 hours. HbA1C: No results for input(s): "HGBA1C" in the last 72 hours. CBG: No results for input(s): "GLUCAP" in the last 168 hours. Lipid Profile: No results for input(s): "CHOL", "HDL", "LDLCALC", "TRIG", "CHOLHDL", "LDLDIRECT" in the last 72 hours. Thyroid Function Tests: No results for input(s): "TSH", "T4TOTAL", "FREET4", "T3FREE", "THYROIDAB" in the last 72 hours. Anemia Panel: Recent Labs    04/16/22 0625  VITAMINB12 580  FOLATE 11.0  FERRITIN 8*  TIBC 434  IRON 39  RETICCTPCT 1.3   Sepsis Labs: No results for input(s): "PROCALCITON", "LATICACIDVEN" in the last 168 hours.  No results found for this or any previous visit (from the past 240  hour(s)).       Radiology Studies: CT Head Wo Contrast  Result Date: 04/16/2022 CLINICAL DATA:  Mental status change, unknown cause. EXAM: CT HEAD WITHOUT CONTRAST TECHNIQUE: Contiguous axial images were obtained from the base of the skull through the vertex without intravenous contrast. RADIATION DOSE REDUCTION: This exam was performed according to the departmental dose-optimization program which includes automated exposure control, adjustment of the mA and/or kV according to patient size and/or use of iterative reconstruction technique. COMPARISON:  02/15/2022. FINDINGS: Brain: No acute intracranial hemorrhage, midline shift or mass effect. No extra-axial fluid collection. Gray-white matter differentiation is within normal limits. Mild generalized atrophy is noted. No hydrocephalus. Vascular: No hyperdense vessel or unexpected calcification. Skull: Normal. Negative for fracture or focal lesion. Sinuses/Orbits: No acute finding. Other: None. IMPRESSION: No acute intracranial process. Electronically Signed   By: Brett Fairy M.D.   On: 04/16/2022 04:09   DG Abdomen Acute W/Chest  Result Date: 04/16/2022 CLINICAL DATA:  Abdominal pain.  Concern for ruptured gastric ulcer. EXAM: DG ABDOMEN ACUTE WITH 1 VIEW CHEST COMPARISON:  CT 02/24/2022 FINDINGS: The cardiomediastinal contours are normal. The lungs are clear. No pneumomediastinum. There is no free intra-abdominal air. No dilated bowel loops to suggest obstruction. Mild gaseous distension of ascending and transverse colon, with formed stool in the distal colon. No abnormal rectal distention. Cholecystectomy clips in the right upper quadrant. Left pelvic calcification is a phlebolith. No radiopaque calculi. No acute osseous abnormalities are seen. IMPRESSION: 1. No free intra-abdominal air or evidence of bowel perforation. 2. Mild gaseous distension of ascending and transverse colon without evidence of obstruction or ileus. 3. No acute chest findings.  Electronically Signed   By: Keith Rake M.D.   On: 04/16/2022 03:06        Scheduled Meds:  folic acid  1 mg Oral Daily   multivitamin with minerals  1 tablet Oral Daily   pantoprazole (PROTONIX) IV  40 mg Intravenous Q12H   rifaximin  550 mg Oral BID   thiamine  100 mg Oral Daily   Or   thiamine  100 mg Intravenous Daily   Continuous Infusions:  sodium chloride 75 mL/hr at 04/16/22 0738     LOS: 0 days    Time spent: 56 min    Nicolette Bang, MD [facility]  If 7PM-7AM, please contact night-coverage  04/16/2022, 2:58 PM

## 2022-04-16 NOTE — ED Notes (Signed)
Patient refused to give urine specimen. States "I dont understand why yall need that, I'm not doing that"

## 2022-04-16 NOTE — ED Provider Notes (Signed)
[facility] Provider Note   CSN: [id number] Arrival date & time: 04/16/22  0145     History  Chief Complaint  Patient presents with   Alcohol Intoxication    Joann [name] is a 41 y.o. female.  HPI  Patient medical history including alcohol dependency, alcoholic cirrhosis, small esophageal varices, hepatic encephalopathy presents  with complaints of alcohol detox.  Patient states that she has been drinking over the last couple days, states she typically drinks about a gallon of liquor, she started this on Friday  last drink was Sunday morning, she states that she  had seizures and DTs in the past, she denies any suicidal homicidal ideations denies any hallucinations or delusions.  She states that she is having some epigastric pain with nausea and vomiting which started today, states she has had multiple episodes of hematemesis denies coffee-ground emesis  admits to melena but denies hematochezia, she denies any urinary symptoms.  She denies any other drug use.  She has no other complaints.  I spoke to patient's husband who informed her that patient has been on a few day bender, and is concerned that she needs help with detox.  States that she has not had any suicidal homicidal ideations.   Reviewed patient's chart has been hospitalized in the past for DTs, most recent hospitalization was in June where she was placed on Precedex, previous hospitalization in April she had significant drop in her hemoglobin and was given 4 units of blood and was encephalopathic secondary to elevated ammonia levels.  This all resolved after she was given lactulose and thiamine.    Home Medications Prior to Admission medications   Medication Sig Start Date End Date Taking? Authorizing Provider  ferrous sulfate 325 (65 FE) MG tablet Take 1 tablet (325 mg total) by mouth daily with breakfast. 02/26/22 03/28/22  Donne Hazel, MD  folic acid (FOLVITE) 1 MG tablet Take 1  tablet (1 mg total) by mouth daily. 12/22/21   France Ravens, MD  lactulose [facility]) 10 GM/15ML solution Take 20 g by mouth daily as needed for mild constipation.    [provider]  Multiple Vitamin (MULTIVITAMIN WITH MINERALS) TABS tablet Take 1 tablet by mouth daily. 01/15/20   Angiulli, Lavon Paganini, PA-C  pantoprazole (PROTONIX) 40 MG tablet Take 1 tablet (40 mg total) by mouth 2 (two) times daily. 12/22/21   Erskine Emery, MD  phytonadione (VITAMIN K) 5 MG tablet Take 2 tablets (10 mg total) by mouth daily. 02/26/22   Mansouraty, Telford Nab., MD  rifaximin (XIFAXAN) 550 MG TABS tablet Take 1 tablet (550 mg total) by mouth 2 (two) times daily. 12/22/21   Erskine Emery, MD  traZODone (DESYREL) 150 MG tablet Take 150 mg by mouth at bedtime. 01/11/22   [provider]  vitamin B-12 (CYANOCOBALAMIN) 100 MCG tablet Take 100 mcg by mouth daily.    [provider]  zinc sulfate 220 (50 Zn) MG capsule Take 1 capsule (220 mg total) by mouth daily. 01/15/20   Angiulli, Lavon Paganini, PA-C      Allergies    Latex    Review of Systems   Review of Systems  Constitutional:  Negative for chills and fever.  Respiratory:  Negative for shortness of breath.   Cardiovascular:  Negative for chest pain.  Gastrointestinal:  Positive for abdominal pain, nausea and vomiting.  Neurological:  Negative for headaches.    Physical Exam Updated Vital Signs BP 129/86 (BP Location: Right Arm)  Pulse (!) 102   Temp (!) 97.3 F (36.3 C) (Oral)   Resp 15   Ht 5' 4"  (1.626 m)   Wt 63.5 kg   SpO2 99%   BMI 24.03 kg/m  Physical Exam Vitals and nursing note reviewed. Exam conducted with a chaperone present.  Constitutional:      General: She is not in acute distress.    Appearance: She is not ill-appearing.  HENT:     Head: Normocephalic and atraumatic.     Nose: No congestion.  Eyes:     General: No scleral icterus.    Conjunctiva/sclera: Conjunctivae normal.  Cardiovascular:     Rate and  Rhythm: Normal rate and regular rhythm.     Pulses: Normal pulses.     Heart sounds: No murmur heard.    No friction rub. No gallop.  Pulmonary:     Effort: No respiratory distress.     Breath sounds: No wheezing, rhonchi or rales.  Abdominal:     Palpations: Abdomen is soft.     Tenderness: There is abdominal tenderness. There is no right CVA tenderness or left CVA tenderness.     Comments: Abdomen nondistended, soft, tenderness noted in the epigastric and right upper quadrant, without guarding rebound has peritoneal sign negative [employer] point.  Genitourinary:    Comments: With chaperone present rectal exam was performed, no noted external hemorrhoids, no fissures noted, no frank rectal bleeding, rectal exam was performed no palpable mass no large stool burden, stool was brown in color, no hematochezia Skin:    General: Skin is warm and dry.  Neurological:     Mental Status: She is alert.     Comments: No facial asymmetry no difficulty with word finding following two-step commands no real weakness present.  Psychiatric:        Mood and Affect: Mood normal.     Comments: Appears intoxicated during my exam, somnolent but easily arousable, does not endorse suicidal/homicidal ideations does not appear to be respond to internal stimuli.     ED Results / Procedures / Treatments   Labs (all labs ordered are listed, but only abnormal results are displayed) Labs Reviewed  COMPREHENSIVE METABOLIC PANEL - Abnormal; Notable for the following components:      Result Value   Potassium 3.0 (*)    Chloride 116 (*)    CO2 17 (*)    Glucose, Bld 100 (*)    BUN <5 (*)    Calcium 8.2 (*)    Total Protein 8.9 (*)    Albumin 3.1 (*)    AST 71 (*)    Alkaline Phosphatase 147 (*)    All other components within normal limits  CBC WITH DIFFERENTIAL/PLATELET - Abnormal; Notable for the following components:   Hemoglobin 8.3 (*)    HCT 30.2 (*)    MCV 77.6 (*)    MCH 21.3 (*)    MCHC 27.5  (*)    RDW 20.5 (*)    Platelets 62 (*)    All other components within normal limits  ETHANOL - Abnormal; Notable for the following components:   Alcohol, Ethyl (B) 424 (*)    All other components within normal limits  AMMONIA - Abnormal; Notable for the following components:   Ammonia 93 (*)    All other components within normal limits  LIPASE, BLOOD  RAPID URINE DRUG SCREEN, HOSP PERFORMED  URINALYSIS, ROUTINE W REFLEX MICROSCOPIC  PROTIME-INR  POC OCCULT BLOOD, ED  TYPE AND SCREEN  EKG None  Radiology CT Head Wo Contrast  Result Date: 04/16/2022 CLINICAL DATA:  Mental status change, unknown cause. EXAM: CT HEAD WITHOUT CONTRAST TECHNIQUE: Contiguous axial images were obtained from the base of the skull through the vertex without intravenous contrast. RADIATION DOSE REDUCTION: This exam was performed according to the departmental dose-optimization program which includes automated exposure control, adjustment of the mA and/or kV according to patient size and/or use of iterative reconstruction technique. COMPARISON:  02/15/2022. FINDINGS: Brain: No acute intracranial hemorrhage, midline shift or mass effect. No extra-axial fluid collection. Gray-white matter differentiation is within normal limits. Mild generalized atrophy is noted. No hydrocephalus. Vascular: No hyperdense vessel or unexpected calcification. Skull: Normal. Negative for fracture or focal lesion. Sinuses/Orbits: No acute finding. Other: None. IMPRESSION: No acute intracranial process. Electronically Signed   By: Brett Fairy M.D.   On: 04/16/2022 04:09   DG Abdomen Acute W/Chest  Result Date: 04/16/2022 CLINICAL DATA:  Abdominal pain.  Concern for ruptured gastric ulcer. EXAM: DG ABDOMEN ACUTE WITH 1 VIEW CHEST COMPARISON:  CT 02/24/2022 FINDINGS: The cardiomediastinal contours are normal. The lungs are clear. No pneumomediastinum. There is no free intra-abdominal air. No dilated bowel loops to suggest obstruction. Mild  gaseous distension of ascending and transverse colon, with formed stool in the distal colon. No abnormal rectal distention. Cholecystectomy clips in the right upper quadrant. Left pelvic calcification is a phlebolith. No radiopaque calculi. No acute osseous abnormalities are seen. IMPRESSION: 1. No free intra-abdominal air or evidence of bowel perforation. 2. Mild gaseous distension of ascending and transverse colon without evidence of obstruction or ileus. 3. No acute chest findings. Electronically Signed   By: Keith Rake M.D.   On: 04/16/2022 03:06    Procedures Procedures    Medications Ordered in ED Medications  thiamine (VITAMIN B1) injection 500 mg (has no administration in time range)  magnesium sulfate IVPB 2 g 50 mL (has no administration in time range)  lactulose (CHRONULAC) 10 GM/15ML solution 30 g (has no administration in time range)  sodium chloride 0.9 % bolus 1,000 mL (1,000 mLs Intravenous New Bag/Given 04/16/22 0247)  ondansetron (ZOFRAN) injection 4 mg (4 mg Intravenous Given 04/16/22 0249)  pantoprazole (PROTONIX) injection 40 mg (40 mg Intravenous Given 04/16/22 0249)    ED Course/ Medical Decision Making/ A&P                           Medical Decision Making Amount and/or Complexity of Data Reviewed Labs: ordered. Radiology: ordered.  Risk Prescription drug management.   This patient presents to the ED for concern of detox, this involves an extensive number of treatment options, and is a complaint that carries with it a high risk of complications and morbidity.  The differential diagnosis includes metabolic derailments, boerhaave tear, upper GI bleed, metabolic encephalopathy    Additional history obtained:  Additional history obtained from husband External records from outside source obtained and reviewed including previous GI notes, previous hospitalization notes   Co morbidities that complicate the patient evaluation  Cirrhosis  Social Determinants  of Health:  Alcohol dependency    Lab Tests:  I Ordered, and personally interpreted labs.  The pertinent results include: CBC shows microcytic anemia with hemoglobin of 8.3 baseline, thrombocytopenia with platelet count of 62, CMP shows hypokalemia 3, CO2 of 17, hyperglycemia of 100, AST 71 ALT 147, ammonia level 93, lipase 44, ethanol 424   Imaging Studies ordered:  I ordered imaging studies including  acute chest abdomen, CT head I independently visualized and interpreted imaging which showed negative acute findings I agree with the radiologist interpretation   Cardiac Monitoring:  The patient was maintained on a cardiac monitor.  I personally viewed and interpreted the cardiac monitored which showed an underlying rhythm of: n/a   Medicines ordered and prescription drug management:  I ordered medication including fluids, antiemetics, Protonix I have reviewed the patients home medicines and have made adjustments as needed  Critical Interventions:  N/A   Reevaluation:  Presents with detox, endorses hematemesis, has a history of esophageal varices, concern for upper GI bleed will obtain lab work, started on Protonix, fluids, and continue to monitor.  Noted the patient has an ethanol of 424 as well as an elevated ammonia level of 93, suspect this is likely cause of her acute altered mental status, will provide with lactulose, also started on thiamine magnesium.  I spoke with the patient and recommend admission she is agreement this plan.   Consultations Obtained:  I requested consultation with the Dr. Marcello Moores of the hospitalist team,  and discussed lab and imaging findings as well as pertinent plan - they recommend: will admit the patient    Test Considered:  CT abdomen pelvis-we will defer as she has nonsurgical abdomen my suspicion for internal infection is very low at this time.    Rule out Low suspicion for CVA no focal deficit present my exam.  Low suspicion for  intracranial head bleed or mass CT imaging is negative. I have low suspicion for psychiatric emergency does not endorse suicidal homicidal ideations does not appear to be responding to internal stimuli.  I have low suspicion for GI bleed as she has no drop in her hemoglobin at her baseline, Hemoccult is negative.  I have low suspicion for liver or gallbladder abnormality as her liver signs alk phos are at her baseline no significant right upper quadrant tenderness during my exam.  Low suspicion for pancreatitis as lipase is within normal limits.  Low suspicion for ruptured stomach ulcer as she has no peritoneal sign present on exam, acute abdomen is negative for this.  Low suspicion for bowel obstruction as abdomen is nondistended normal bowel sounds, so passing gas and having normal bowel movements.  Low suspicion for complicated diverticulitis as she is nontoxic-appearing, vital signs reassuring no leukocytosis present.  I have low suspicion for intra-abdominal infection as she has low risk factors, vital signs reassuring, no leukocytosis, will defer imaging at this time.     Dispostion and problem list  After consideration of the diagnostic results and the patients response to treatment, I feel that the patent would benefit from admission.  Altered mental status-multifactorial alcohol intoxication as well as metabolic encephalopathy PE, patient will need time to metabolize, and be given her lactulose.  Suspect that during her metabolization she will go into DTs and will likely need to be placed on CIWA protocol.            Final Clinical Impression(s) / ED Diagnoses Final diagnoses:  Alcoholic intoxication without complication ([place])  Hepatic encephalopathy [facility])    Rx / DC Orders ED Discharge Orders     None         Marcello Fennel, PA-C 04/16/22 0427    Palumbo, April, MD 04/16/22 8032

## 2022-04-17 DIAGNOSIS — K2921 Alcoholic gastritis with bleeding: Secondary | ICD-10-CM | POA: Diagnosis present

## 2022-04-17 DIAGNOSIS — F1010 Alcohol abuse, uncomplicated: Secondary | ICD-10-CM | POA: Diagnosis not present

## 2022-04-17 LAB — CBC
HCT: 28.1 % — ABNORMAL LOW (ref 36.0–46.0)
HCT: 25.7 % — ABNORMAL LOW (ref 36.0–46.0)
Hemoglobin: 7.5 g/dL — ABNORMAL LOW (ref 12.0–15.0)
Hemoglobin: 7.3 g/dL — ABNORMAL LOW (ref 12.0–15.0)
MCH: 21.2 pg — ABNORMAL LOW (ref 26.0–34.0)
MCH: 21.4 pg — ABNORMAL LOW (ref 26.0–34.0)
MCHC: 26.7 g/dL — ABNORMAL LOW (ref 30.0–36.0)
MCHC: 28.4 g/dL — ABNORMAL LOW (ref 30.0–36.0)
MCV: 79.4 fL — ABNORMAL LOW (ref 80.0–100.0)
MCV: 75.4 fL — ABNORMAL LOW (ref 80.0–100.0)
Platelets: 39 10*3/uL — ABNORMAL LOW (ref 150–400)
Platelets: 23 10*3/uL — CL (ref 150–400)
RBC: 3.54 MIL/uL — ABNORMAL LOW (ref 3.87–5.11)
RBC: 3.41 MIL/uL — ABNORMAL LOW (ref 3.87–5.11)
RDW: 20.2 % — ABNORMAL HIGH (ref 11.5–15.5)
RDW: 20.2 % — ABNORMAL HIGH (ref 11.5–15.5)
WBC: 3.3 10*3/uL — ABNORMAL LOW (ref 4.0–10.5)
WBC: 2.1 10*3/uL — ABNORMAL LOW (ref 4.0–10.5)
nRBC: 0 % (ref 0.0–0.2)
nRBC: 0 % (ref 0.0–0.2)

## 2022-04-17 LAB — BASIC METABOLIC PANEL
Anion gap: 8 (ref 5–15)
BUN: <5 mg/dL — ABNORMAL LOW (ref 6–20)
CO2: 20 mmol/L — ABNORMAL LOW (ref 22–32)
Calcium: 7.6 mg/dL — ABNORMAL LOW (ref 8.9–10.3)
Chloride: 106 mmol/L (ref 98–111)
Creatinine, Ser: 0.53 mg/dL (ref 0.44–1.00)
GFR, Estimated: >60 mL/min (ref >60)
Glucose, Bld: 94 mg/dL (ref 70–99)
Potassium: 3.2 mmol/L — ABNORMAL LOW (ref 3.5–5.1)
Sodium: 134 mmol/L — ABNORMAL LOW (ref 135–145)

## 2022-04-17 LAB — MAGNESIUM: Magnesium: 1 mg/dL — ABNORMAL LOW (ref 1.7–2.4)

## 2022-04-17 LAB — AMMONIA: Ammonia: 131 umol/L — ABNORMAL HIGH (ref 9–35)

## 2022-04-17 MED ORDER — POTASSIUM CHLORIDE CRYS ER 20 MEQ PO TBCR
40.0000 meq | EXTENDED_RELEASE_TABLET | Freq: Once | ORAL | Status: AC
  Administered 2022-04-17: 40 meq via ORAL
  Filled 2022-04-17: qty 2

## 2022-04-17 MED ORDER — MAGNESIUM SULFATE 4 GM/100ML IV SOLN
4.0000 g | Freq: Once | INTRAVENOUS | Status: AC
  Administered 2022-04-17: 4 g via INTRAVENOUS
  Filled 2022-04-17: qty 100

## 2022-04-17 MED ORDER — LACTULOSE 10 GM/15ML PO SOLN
20.0000 g | Freq: Two times a day (BID) | ORAL | Status: DC
  Administered 2022-04-17: 20 g via ORAL
  Filled 2022-04-17: qty 30

## 2022-04-17 NOTE — Assessment & Plan Note (Signed)
GI consult. -Made lactulose schedule -Continue with rifaximin

## 2022-04-17 NOTE — Hospital Course (Addendum)
Taken from prior notes.  Joann [name] is a 41 y.o. female with medical history significant of alcohol abuse, alcoholic cirrhosis, hepatic encephalopathy,HTN, Anxiety, history of severe with with DT/SZ who presents to ED s/p binge drinking over the last 24-48 hours. Patient states she drank close to 2 gallon of vodka. She states she wants to stop drinking. She notes that after binge drinking she started to experience n/v/ and abdominal pain. She endorse nose bleed as well as hematemesis and black stools that began today. She denies si or hi, she states the last time she was in inpatient rehab was around 6 months ago. She notes no fever/ chills/chest pain / sob / cough / dysuria.   Patient was admitted for concern of alcoholic gastritis. Labs pertinent for significantly elevated alcohol levels, worsening thrombocytopenia, hypomagnesemia and hypokalemia.  INR elevated at 1.5. She was started on CIWA protocol.  8/8: Patient was alert and oriented, ammonia levels continue to rise at 131 today.  She was asking to complete detox as she want to quit alcohol.  Has an history of severe withdrawal which include DTs and seizure.

## 2022-04-17 NOTE — Assessment & Plan Note (Signed)
Patient with history of significant alcohol use came after having binge drinking.  Would like to get detox as she wants to quit. History of severe withdrawal which include DTs and seizures CIWA score at 8 -Continue with CIWA protocol with Ativan -Continue with supplements with thiamine and folate

## 2022-04-17 NOTE — Assessment & Plan Note (Signed)
Estimated body mass index is 24.03 kg/m as calculated from the following:   Height as of this encounter: 5' 4"  (1.626 m).   Weight as of this encounter: 63.5 kg.   -Dietitian consult

## 2022-04-17 NOTE — Consult Note (Signed)
[facility] Consult: 4:06 PM 04/17/2022  LOS: 1 day    Referring Provider: Dr Latina Craver  Primary Care Physician:  Pcp, No Primary Gastroenterologist:   Roosevelt Locks NP, has not seen her in a few years.  Dr Rush Landmark.      Reason for Consultation:  Hematemesis in cirrhotic.     HPI: Joann [name] is a 41 y.o. female.  PMH ETOH use disorder.  Cirrhosis due to alcohol.  Esophageal varices.  Hepatic encephalopathy.  Alcohol associated pancreatitis.  Previous cholecystectomy.  Multiple admissions for alcoholic hepatitis.  Iron deficiency anemia.  Thrombocytopenia, platelets of 66K in 12/2021  Coagulopathy, INR 1.6 in April 2023.  Hepatic encephalopathy. No prior colonoscopy. Pt not  transplant candidate.    2020 liver biopsy.  Liver injury pattern with mixed hepatic/cholestasis consistent with alcoholic hepatitis.  F3 fibrosis.  No evidence for autoimmune liver disease. Generally noncompliant with medical follow-up. 12/2019 EGD.  Dr Benson Norway.  revealing small esophageal varices, large food residue in stomach. ASMA positive.  ANA negative.  AMA negative. HBV NR, Hep A IgM negative.  HBV surf Ag NR, HBV core IgM NR, HCV Ab NR.  02/16/2021 RUQ ultrasound: Cirrhosis, no concerning liver lesions. 12/17/2021 RUQ ultrasound.  Gallbladder absent.  3.1 mm CBD.  Cirrhosis changes in liver.  No HCCA or suspicious lesions.  PV Dopplers unremarkable.  Recanalized umbilicus vein, probable portal hypertension. No ascites.  12/2021 admission with Hb 4.4, FOBT negative.  Received 4 PRBCs, Hgb 9.6 near discharge.   Had some period of sobriety following her April 2023 admission but relapsed sometime in May, June.  Admitted in early June with encephalopathy, active alcohol withdrawal, obtundation.  Detoxed on Precedex.  Bilirubin 5, dropped to 1.4.   INR 1.5.  02/24/22 CTAP w contrast: For eval abdominal pain showed possible pyelonephritis on left.  Hepatomegaly, nodular, cirrhotic liver.  Splenomegaly.  Small, nonobstructing left renal stone and some renal cysts.  Ischarged after about 10 days.  Her intention was to attend 28-day rehab program at [facility] but patient informs me that [facility] did not feel comfortable admitting her due to her multiple medical issues.  Her last stint in rehab was about 6 months ago.` Has appointment for EGD and colonoscopy with Dr. Rush Landmark set for later this month Outpatient meds include oral iron, Chronulac 20 g prn, Protonix 40 mg bid, vitamin K 10 mg daily.  Rifaximin 550 mg bid, several vitamin/mineral supplements  Presented early a.m. on 8/7 to request detox as she has been actively drinking.  Reports of drinking 2 gallons of vodka over a few days.  After this experienced epigastric pain, nausea vomiting and eventually developed episodes of hematemesis.  Stools described as dark but tested FOBT negative. Today in speaking to the patient she does not recall recent nausea, vomiting or bloody emesis.  She says she has been compliant with meds.  Not taking aspirin products.  Occasionally uses ibuprofen to address period cramps.  Patient's appetite is variable.  When she is drinking she does not eat much.  This leads to  her not having bowel movements.  When things are going well, she has about 1-2 bowel movements a day and has not seen any blood.  ETOH 332.   Sodium 134.  Potassium 3.2.  T. bili 1.3.  Alk phos 133.  AST/ALT 73/11. Ammonia 131 Hgb 7.3.  MCV 75.  Platelets 23 K. Iron normal 39, TIBC normal.  Iron sats low.  Ferritin low at 8.  Folate, B12 normal. Stool FOBT negative.   CT head on remarkable.  She wants to go home and plans to leave the hospital tomorrow.  She declines offer for endoscopy tomorrow.  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  Lives with  her husband and 38 year old son.  Past Medical History:  Diagnosis Date   [redacted] weeks gestation of pregnancy 03/22/2020   Alcohol abuse    Alcohol withdrawal delirium ([place]) 02/15/2022   Alcohol-induced acute pancreatitis    Alcoholic ketoacidosis 92/07/9416   Anxiety    DTs (delirium tremens) ([place])    Elevated LFTs 10/24/2018   Encephalopathy acute    Hepatic encephalopathy ([place]) 12/24/2019   Hypertension    Hypokalemia 04/02/2019   Hypotension 02/24/2022   Liver disease     Past Surgical History:  Procedure Laterality Date   ECTOPIC PREGNANCY SURGERY     ESOPHAGOGASTRODUODENOSCOPY (EGD) WITH PROPOFOL N/A 12/27/2019   Procedure: ESOPHAGOGASTRODUODENOSCOPY (EGD) WITH PROPOFOL;  Surgeon: Carol Ada, MD;  Location: [facility];  Service: Endoscopy;  Laterality: N/A;    Prior to Admission medications   Medication Sig Start Date End Date Taking? Authorizing Provider  folic acid (FOLVITE) 1 MG tablet Take 1 tablet (1 mg total) by mouth daily. 12/22/21  Yes France Ravens, MD  lactulose [facility]) 10 GM/15ML solution Take 20 g by mouth daily as needed for mild constipation.   Yes [provider]  Multiple Vitamin (MULTIVITAMIN WITH MINERALS) TABS tablet Take 1 tablet by mouth daily. 01/15/20  Yes Angiulli, Lavon Paganini, PA-C  phytonadione (VITAMIN K) 5 MG tablet Take 2 tablets (10 mg total) by mouth daily. 02/26/22  Yes Mansouraty, Telford Nab., MD  rifaximin (XIFAXAN) 550 MG TABS tablet Take 1 tablet (550 mg total) by mouth 2 (two) times daily. 12/22/21  Yes Erskine Emery, MD  traZODone (DESYREL) 150 MG tablet Take 150 mg by mouth at bedtime. 01/11/22  Yes [provider]  ferrous sulfate 325 (65 FE) MG tablet Take 1 tablet (325 mg total) by mouth daily with breakfast. Patient not taking: Reported on 04/16/2022 02/26/22 03/28/22  Donne Hazel, MD  pantoprazole (PROTONIX) 40 MG tablet Take 1 tablet (40 mg total) by mouth 2 (two) times daily. Patient not taking: Reported on 04/16/2022 12/22/21    Erskine Emery, MD  zinc sulfate 220 (50 Zn) MG capsule Take 1 capsule (220 mg total) by mouth daily. Patient not taking: Reported on 04/16/2022 01/15/20   Angiulli, Lavon Paganini, PA-C    Scheduled Meds:  folic acid  1 mg Oral Daily   lactulose  20 g Oral BID   multivitamin with minerals  1 tablet Oral Daily   pantoprazole (PROTONIX) IV  40 mg Intravenous Q12H   rifaximin  550 mg Oral BID   thiamine  100 mg Oral Daily   Or   thiamine  100 mg Intravenous Daily   Infusions:  magnesium sulfate bolus IVPB     PRN Meds: acetaminophen **OR** acetaminophen, albuterol, LORazepam **OR** LORazepam, ondansetron **OR** ondansetron (ZOFRAN) IV   Allergies as  of 04/16/2022 - Review Complete 04/16/2022  Allergen Reaction Noted   Latex Hives 01/08/2020    Family History  Problem Relation Age of Onset   Healthy Mother    Alcohol abuse Mother        quit in her 75s   Alcohol abuse Father    Diabetes Father    Healthy Brother    Hypertension Maternal Grandmother    Hypertension Maternal Grandfather    Healthy Brother    Healthy Brother     Social History   Socioeconomic History   Marital status: Married    Spouse name: Not on file   Number of children: Not on file   Years of education: Not on file   Highest education level: Not on file  Occupational History   Occupation: nurse  Tobacco Use   Smoking status: Never   Smokeless tobacco: Never  Vaping Use   Vaping Use: Never used  Substance and Sexual Activity   Alcohol use: Yes    Comment: "I just do it"   Drug use: Never   Sexual activity: Yes  Other Topics Concern   Not on file  Social History Narrative   Not on file   Social Determinants of Health   Financial Resource Strain: Not on file  Food Insecurity: Not on file  Transportation Needs: Not on file  Physical Activity: Not on file  Stress: Not on file  Social Connections: Not on file  Intimate Partner Violence: Not on file    REVIEW OF SYSTEMS: Constitutional:  Denies weakness and fatigue.  No significant weight fluctuation. ENT:  No nose bleeds Pulm: No shortness of breath or cough. CV:  No palpitations, no LE edema.  GU:  No hematuria, no frequency GI: No abdominal swelling. Heme: No unusual or excessive bleeding or bruising. Transfusions: See HPI. Neuro:  No headaches, no peripheral tingling or numbness.  Denies recent seizures or syncope. Derm:  No itching, no rash or sores.  Endocrine:  No sweats or chills.  No polyuria or dysuria Immunization: Reviewed. Travel: Not queried.  PHYSICAL EXAM: Vital signs in last 24 hours: Vitals:   04/17/22 0943 04/17/22 1200  BP:  96/66  Pulse:  96  Resp:  17  Temp: 98.3 F (36.8 C) 98.3 F (36.8 C)  SpO2:  100%   Wt Readings from Last 3 Encounters:  04/16/22 63.5 kg  02/26/22 73.9 kg  01/11/22 72.6 kg    General: Patient looks well.  She is alert. Head: Facial asymmetry or swelling.  No signs of head trauma. Eyes: No scleral icterus or conjunctival pallor. Ears: Not hard of hearing  Nose: No discharge or congestion Mouth: Good dentition.  Moist, clear, pink mucous membranes.  Tongue midline Neck: No JVD, no masses, no thyromegaly Lungs: Clear bilaterally.  No labored breathing or cough. Heart: RRR.  No MRG. Abdomen: Soft.  No tenderness.  Splenomegaly.  Nontender liver.  No masses.  No bruits.  No hernias..   Rectal: Deferred Musc/Skeltl: No obvious joint redness, swelling or gross deformity. Extremities: No CCE. Neurologic: Oriented x3.  No tremors.  No asterixis. Skin: No obvious jaundice. Nodes: No cervical adenopathy Psych: Cooperative, calm.  Fluid speech.  Intake/Output from previous day: 08/07 0701 - 08/08 0700 In: 876 [I.V.:876] Out: -  Intake/Output this shift: No intake/output data recorded.  LAB RESULTS: Recent Labs    04/16/22 0232 04/16/22 0625 04/16/22 1345 04/17/22 0233 04/17/22 1217  WBC 4.4  --   --  3.3* 2.1*  HGB 8.3*   < > 7.5* 7.5* 7.3*  HCT 30.2*    < > 27.8* 28.1* 25.7*  PLT 62*  --   --  39* 23*   < > = values in this interval not displayed.   BMET Lab Results  Component Value Date   NA 134 (L) 04/17/2022   NA 140 04/16/2022   NA 145 04/16/2022   K 3.2 (L) 04/17/2022   K 3.0 (L) 04/16/2022   K 3.0 (L) 04/16/2022   CL 106 04/17/2022   CL 110 04/16/2022   CL 116 (H) 04/16/2022   CO2 20 (L) 04/17/2022   CO2 17 (L) 04/16/2022   CO2 17 (L) 04/16/2022   GLUCOSE 94 04/17/2022   GLUCOSE 201 (H) 04/16/2022   GLUCOSE 100 (H) 04/16/2022   BUN <5 (L) 04/17/2022   BUN <5 (L) 04/16/2022   BUN <5 (L) 04/16/2022   CREATININE 0.53 04/17/2022   CREATININE 0.57 04/16/2022   CREATININE 0.49 04/16/2022   CALCIUM 7.6 (L) 04/17/2022   CALCIUM 7.4 (L) 04/16/2022   CALCIUM 8.2 (L) 04/16/2022   LFT Recent Labs    04/16/22 0232 04/16/22 1345  PROT 8.9* 8.0  ALBUMIN 3.1* 2.8*  AST 71* 73*  ALT 12 11  ALKPHOS 147* 133*  BILITOT 1.1 1.3*   PT/INR Lab Results  Component Value Date   INR 1.5 (H) 04/16/2022   INR 1.7 (H) 02/26/2022   INR 1.5 (H) 02/23/2022   Hepatitis Panel No results for input(s): "HEPBSAG", "HCVAB", "HEPAIGM", "HEPBIGM" in the last 72 hours. C-Diff No components found for: "CDIFF" Lipase     Component Value Date/Time   LIPASE 44 04/16/2022 0232    Drugs of Abuse     Component Value Date/Time   LABOPIA NONE DETECTED 04/16/2022 0514   COCAINSCRNUR NONE DETECTED 04/16/2022 0514   LABBENZ NONE DETECTED 04/16/2022 0514   AMPHETMU NONE DETECTED 04/16/2022 0514   THCU NONE DETECTED 04/16/2022 0514   LABBARB NONE DETECTED 04/16/2022 0514     RADIOLOGY STUDIES: CT Head Wo Contrast  Result Date: 04/16/2022 CLINICAL DATA:  Mental status change, unknown cause. EXAM: CT HEAD WITHOUT CONTRAST TECHNIQUE: Contiguous axial images were obtained from the base of the skull through the vertex without intravenous contrast. RADIATION DOSE REDUCTION: This exam was performed according to the departmental dose-optimization  program which includes automated exposure control, adjustment of the mA and/or kV according to patient size and/or use of iterative reconstruction technique. COMPARISON:  02/15/2022. FINDINGS: Brain: No acute intracranial hemorrhage, midline shift or mass effect. No extra-axial fluid collection. Gray-white matter differentiation is within normal limits. Mild generalized atrophy is noted. No hydrocephalus. Vascular: No hyperdense vessel or unexpected calcification. Skull: Normal. Negative for fracture or focal lesion. Sinuses/Orbits: No acute finding. Other: None. IMPRESSION: No acute intracranial process. Electronically Signed   By: Brett Fairy M.D.   On: 04/16/2022 04:09   DG Abdomen Acute W/Chest  Result Date: 04/16/2022 CLINICAL DATA:  Abdominal pain.  Concern for ruptured gastric ulcer. EXAM: DG ABDOMEN ACUTE WITH 1 VIEW CHEST COMPARISON:  CT 02/24/2022 FINDINGS: The cardiomediastinal contours are normal. The lungs are clear. No pneumomediastinum. There is no free intra-abdominal air. No dilated bowel loops to suggest obstruction. Mild gaseous distension of ascending and transverse colon, with formed stool in the distal colon. No abnormal rectal distention. Cholecystectomy clips in the right upper quadrant. Left pelvic calcification is a phlebolith. No radiopaque calculi. No acute osseous abnormalities are seen. IMPRESSION: 1. No free intra-abdominal air or  evidence of bowel perforation. 2. Mild gaseous distension of ascending and transverse colon without evidence of obstruction or ileus. 3. No acute chest findings. Electronically Signed   By: Keith Rake M.D.   On: 04/16/2022 03:06      IMPRESSION:     Hematemesis.  Patient denies this but someone reported it when she came into the ER yesterday.  Pt expressing no desire to undergo inpt EGD.  FOBT negative.      Refractory ETOH abuse.  Suspect her reason for wanting to leave is that she wants to drink.    Thrombocytopenia.  Splenomegaly.   Platelets are 23 K    Anemia, Ferritin is low.  Hgb stable.  Did receive transfusions in April when Hb dropped 4.4.    Hx HE.   Compliant w rifaximin and lactulose.  Manages to have 1-2 bowel movements daily but not on a daily basis.     Coagulopathy.  INR of 1.5 stable compared with 2 months ago.   PLAN:       Pt declined inpt EGD now. Has 05/03/22 EGD, colonoscopy set at [facility] w Dr Rush Landmark.     Continue all current outpt meds.      Encourage abstinence from ETOH.      Azucena Freed  04/17/2022, 4:06 PM Phone [phone]

## 2022-04-17 NOTE — Assessment & Plan Note (Signed)
Secondary to alcoholic liver disease and cirrhosis. -Monitor liver function

## 2022-04-17 NOTE — Progress Notes (Signed)
TRH night coverage note.  Pt seen at bedside, pt let RN know she was going to leave AMA.  Pt is clearly AAOx4 at this time and does have decision making capacity.  Pt states shes leaving AMA because she is worried about her job.  She tells me shes only been at Job for ~1 month (so may not / will not qualify for FMLA when I brought this point up).  Did tell patient, and her husband whom she gave me permission to speak with (and called on her cell phone for me to speak with), that I was very worried about her health, expressed my concern over her severe thrombocytopenia, suspected GI bleed from EtOH gastritis all in setting of EtOH cirrhosis.  Still unable to convince patient to stay.  Do feel, in my clinical judgement, that she DOES have decision making capacity (even if I dont agree with her decisions).  Thus I have no right to keep her here in hospital against her will.  She is calling a taxi to come pick her up at this time.  Has signed the AMA form.  PIV removed by RN.

## 2022-04-17 NOTE — Assessment & Plan Note (Signed)
Worsening thrombocytopenia with platelet at 23 this morning. Currently no active bleeding. Secondary to liver disease. -Monitor CBC -Platelet transfusion if started bleeding or if platelets continue to drop.

## 2022-04-17 NOTE — Assessment & Plan Note (Signed)
Concern of alcoholic gastritis with dark-colored stool and some hematemesis.  Hemoglobin at 9.8 this morning, slight downward trend. -GI consult -Continue with twice daily Protonix -Monitor hemoglobin

## 2022-04-17 NOTE — Assessment & Plan Note (Signed)
Patient with hypokalemia and hypomagnesemia. -Replete electrolytes and monitor

## 2022-04-17 NOTE — Assessment & Plan Note (Signed)
Patient was alert and oriented.  Rising ammonia at 131. -Continue to monitor -Made lactulose scheduled twice daily instead of as needed -Continue with rifaximin

## 2022-04-17 NOTE — TOC Initial Note (Signed)
Transition of Care [facility]) - Initial/Assessment Note    Patient Details  Name: Joann [name] MRN: [id number] Date of Birth: 06-22-1981  [facility] of [facility]) CM/SW Contact:    Barton Fanny, [place] Work Phone Number: 04/17/2022, 4:19 PM  Clinical Narrative:                 MSW intern spoke with patient at bedside and offered community resources for her alcohol consumption. Patient stated she knows she has a problem and is working to get into a rehab facility to gain her sobriety. Patient stated she has a great support system of family members and friends at home. Patient was very anxious to get out of the hospital and advised she needed to get back to work. MSW intern left community resources in patient room with patient.  Expected Discharge Plan: Home/Self Care Barriers to Discharge: Continued Medical Work up   Patient Goals and CMS Choice Patient states their goals for this hospitalization and ongoing recovery are:: Return home CMS Medicare.gov Compare Post Acute Care list provided to:: Patient    Expected Discharge Plan and Services Expected Discharge Plan: Home/Self Care       Living arrangements for the past 2 months: Single Family Home                                      Prior Living Arrangements/Services Living arrangements for the past 2 months: Single Family Home Lives with:: Spouse Patient language and need for interpreter reviewed:: Yes Do you feel safe going back to the place where you live?: Yes      Need for Family Participation in Patient Care: No (Comment) Care giver support system in place?: Yes (comment)   Criminal Activity/Legal Involvement Pertinent to Current Situation/Hospitalization: No - Comment as needed  Activities of Daily Living Home Assistive Devices/Equipment: None ADL Screening (condition at time of admission) Patient's cognitive ability adequate to safely complete daily activities?: Yes Is the patient deaf or have  difficulty hearing?: No Does the patient have difficulty seeing, even when wearing glasses/contacts?: No Does the patient have difficulty concentrating, remembering, or making decisions?: No Patient able to express need for assistance with ADLs?: Yes Does the patient have difficulty dressing or bathing?: No Independently performs ADLs?: Yes (appropriate for developmental age) Does the patient have difficulty walking or climbing stairs?: No Weakness of Legs: None Weakness of Arms/Hands: None  Permission Sought/Granted                  Emotional Assessment Appearance:: Appears stated age Attitude/Demeanor/Rapport: Engaged, Gracious Affect (typically observed): Accepting Orientation: : Oriented to Self, Oriented to Place, Oriented to  Time, Oriented to Situation Alcohol / Substance Use: Alcohol Use    Admission diagnosis:  Hepatic encephalopathy ([place]) [K76.82] ETOH abuse [V89.38] Alcoholic intoxication without complication ([place]) [B01.751] Patient Active Problem List   Diagnosis Date Noted   Alcoholic gastritis with bleeding 04/17/2022   ETOH abuse 04/16/2022   Electrolyte abnormality 02/24/2022   Leukopenia 02/24/2022   Coagulopathy ([place]) 02/24/2022   Severe protein-calorie malnutrition ([place]) 02/24/2022   Anxiety    Symptomatic anemia 12/17/2021   Esophageal varices without bleeding ([place]) 02/58/5277   Alcoholic cirrhosis ([place]) 82/42/3536   Reactive depression    Wernicke's encephalopathy    Transaminitis    Steroid-induced hyperglycemia    Leucocytosis    Macrocytic anemia    Sleep disturbance  Pressure injury of skin 56/97/9480   Alcoholic hepatitis with ascites    Acute respiratory failure [facility])    Palliative care encounter    Elevated bilirubin    Ascites    Advanced care planning/counseling discussion    Goals of care, counseling/discussion    Palliative care by specialist    Hypoalbuminemia    Hepatic encephalopathy ([place]) 12/24/2019   Alcohol dependence  with withdrawal, unspecified ([place]) 10/24/2018   Thrombocytopenia ([place]) 10/24/2018   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 Go to Review Flowsheets to unlock data.

## 2022-04-17 NOTE — ED Notes (Signed)
Pt ambulated to bathroom with little assistance.  Pt back on monitor in room and new warm blankets given.  Call light within reach.

## 2022-04-17 NOTE — Progress Notes (Signed)
Progress Note   Patient: Joann [name] ZLD:[id number] DOB: 12-27-1980 DOA: 04/16/2022     1 DOS: the patient was seen and examined on 04/17/2022   Brief hospital course: Taken from prior notes.  BRYNA RAZAVI is a 41 y.o. female with medical history significant of alcohol abuse, alcoholic cirrhosis, hepatic encephalopathy,HTN, Anxiety, history of severe with with DT/SZ who presents to ED s/p binge drinking over the last 24-48 hours. Patient states she drank close to 2 gallon of vodka. She states she wants to stop drinking. She notes that after binge drinking she started to experience n/v/ and abdominal pain. She endorse nose bleed as well as hematemesis and black stools that began today. She denies si or hi, she states the last time she was in inpatient rehab was around 6 months ago. She notes no fever/ chills/chest pain / sob / cough / dysuria.   Patient was admitted for concern of alcoholic gastritis. Labs pertinent for significantly elevated alcohol levels, worsening thrombocytopenia, hypomagnesemia and hypokalemia.  INR elevated at 1.5. She was started on CIWA protocol.  8/8: Patient was alert and oriented, ammonia levels continue to rise at 131 today.  She was asking to complete detox as she want to quit alcohol.  Has an history of severe withdrawal which include DTs and seizure.    Assessment and Plan: * ETOH abuse Patient with history of significant alcohol use came after having binge drinking.  Would like to get detox as she wants to quit. History of severe withdrawal which include DTs and seizures CIWA score at 8 -Continue with CIWA protocol with Ativan -Continue with supplements with thiamine and folate  Alcoholic gastritis with bleeding Concern of alcoholic gastritis with dark-colored stool and some hematemesis.  Hemoglobin at 9.8 this morning, slight downward trend. -GI consult -Continue with twice daily Protonix -Monitor hemoglobin  Thrombocytopenia (HCC) Worsening  thrombocytopenia with platelet at 23 this morning. Currently no active bleeding. Secondary to liver disease. -Monitor CBC -Platelet transfusion if started bleeding or if platelets continue to drop.  Alcoholic cirrhosis ([place]) GI consult. -Made lactulose schedule -Continue with rifaximin  Hepatic encephalopathy ([place]) Patient was alert and oriented.  Rising ammonia at 131. -Continue to monitor -Made lactulose scheduled twice daily instead of as needed -Continue with rifaximin  Electrolyte abnormality Patient with hypokalemia and hypomagnesemia. -Replete electrolytes and monitor  Severe protein-calorie malnutrition (HCC) Estimated body mass index is 24.03 kg/m as calculated from the following:   Height as of this encounter: 5' 4"  (1.626 m).   Weight as of this encounter: 63.5 kg.   -Dietitian consult  Transaminitis Secondary to alcoholic liver disease and cirrhosis. -Monitor liver function   Subjective: Patient was seen and examined today.  She was resting comfortably and answering questions appropriately.  She wants to get detox as she would like to quit.  Physical Exam: Vitals:   04/17/22 0815 04/17/22 0900 04/17/22 0943 04/17/22 1200  BP: 120/81 127/77  96/66  Pulse: 100 93  96  Resp: 20 (!) 22  17  Temp:   98.3 F (36.8 C) 98.3 F (36.8 C)  TempSrc:    Oral  SpO2: 100% 100%  100%  Weight:      Height:       General.  Malnourished lady, in no acute distress. Pulmonary.  Lungs clear bilaterally, normal respiratory effort. CV.  Regular rate and rhythm, no JVD, rub or murmur. Abdomen.  Soft, nontender, nondistended, BS positive. CNS.  Alert and oriented .  No focal neurologic  deficit. Extremities.  No edema, no cyanosis, pulses intact and symmetrical. Psychiatry.  Judgment and insight appears normal.  Data Reviewed: Prior data reviewed  Family Communication: Discussed with patient  Disposition: Status is: Inpatient Remains inpatient appropriate because:  Severity of illness   Planned Discharge Destination: Home  Time spent: 50 minutes  This record has been created using Systems analyst. Errors have been sought and corrected,but may not always be located. Such creation errors do not reflect on the standard of care.  Author: Lorella Nimrod, MD 04/17/2022 4:05 PM  For on call review [email or web address].

## 2022-04-18 ENCOUNTER — Telehealth: Payer: Self-pay

## 2022-04-18 DIAGNOSIS — F1090 Alcohol use, unspecified, uncomplicated: Secondary | ICD-10-CM

## 2022-04-18 DIAGNOSIS — D696 Thrombocytopenia, unspecified: Secondary | ICD-10-CM

## 2022-04-18 DIAGNOSIS — K7030 Alcoholic cirrhosis of liver without ascites: Secondary | ICD-10-CM

## 2022-04-18 NOTE — Telephone Encounter (Signed)
I spoke with the pt and she has agreed to have labs 2-3 days piror to the 8/24 procedure. I have entered the orders. She was given the address and told no appt is needed.

## 2022-04-18 NOTE — Progress Notes (Signed)
Patient signed out AMA. Physician came to explain the risks and stated that she really needed to stay.

## 2022-04-18 NOTE — Telephone Encounter (Signed)
-----   Message from Irving Copas., MD sent at 04/18/2022  3:23 PM EDT ----- CD, Thanks for this update. I agree, ideally we would want her PLT count to be 40-50K. Tarrin Lebow, Please reach out to patient and let her know, that before she starts any preparation, she needs to have a CBC/CMP/INR performed at least 2-3-days before the scheduled procedures to ensure safety of performing her procedures and what techniques or tools we may be able to use. Thanks. GM ----- Message ----- From: Sharyn Creamer, MD Sent: 04/18/2022   3:15 PM EDT To: Irving Copas., MD  Cordella Register, I did want to give you an update that Ms. Aylesworth was recently admitted for binge drinking episode, was put on CIWA protocol here. She described some hematemesis/coffee ground emesis upon arrival but then told me that this actually happened several months ago. She ended up leaving AMA last night. She refused to get an inpatient EGD. She is scheduled with you to get an EGD/colon later this month, but her plts were in the 20s so you may want to bring her in to get a CBC check before you do the procedure.   Thanks, Lyndee Leo

## 2022-04-29 NOTE — Discharge Summary (Signed)
Physician Discharge Summary   Patient: Joann [name] MRN: [id number] DOB: 09-28-1980  Admit date:     04/16/2022  Discharge date: 04/17/2022  Discharge Physician: Lorella Nimrod   PCP: Pcp, No   Recommendations at discharge:  Patient left AMA during all withdrawals.  Please see the most recent progress note for further detail.   Discharge Diagnoses: Principal Problem:   ETOH abuse Active Problems:   Alcoholic gastritis with bleeding   Thrombocytopenia (HCC)   Alcoholic cirrhosis (HCC)   Hepatic encephalopathy (HCC)   Electrolyte abnormality   Severe protein-calorie malnutrition (HCC)   Transaminitis  Resolved Problems:   * No resolved hospital problems. [facility] Course: Taken from prior notes.  Joann [name] is a 41 y.o. female with medical history significant of alcohol abuse, alcoholic cirrhosis, hepatic encephalopathy,HTN, Anxiety, history of severe with with DT/SZ who presents to ED s/p binge drinking over the last 24-48 hours. Patient states she drank close to 2 gallon of vodka. She states she wants to stop drinking. She notes that after binge drinking she started to experience n/v/ and abdominal pain. She endorse nose bleed as well as hematemesis and black stools that began today. She denies si or hi, she states the last time she was in inpatient rehab was around 6 months ago. She notes no fever/ chills/chest pain / sob / cough / dysuria.   Patient was admitted for concern of alcoholic gastritis. Labs pertinent for significantly elevated alcohol levels, worsening thrombocytopenia, hypomagnesemia and hypokalemia.  INR elevated at 1.5. She was started on CIWA protocol.  8/8: Patient was alert and oriented, ammonia levels continue to rise at 131 today.  She was asking to complete detox as she want to quit alcohol.  Has an history of severe withdrawal which include DTs and seizure.   Assessment and Plan: * ETOH abuse Patient with history of significant alcohol use came after  having binge drinking.  Would like to get detox as she wants to quit. History of severe withdrawal which include DTs and seizures CIWA score at 8 -Continue with CIWA protocol with Ativan -Continue with supplements with thiamine and folate  Alcoholic gastritis with bleeding Concern of alcoholic gastritis with dark-colored stool and some hematemesis.  Hemoglobin at 9.8 this morning, slight downward trend. -GI consult -Continue with twice daily Protonix -Monitor hemoglobin  Thrombocytopenia (HCC) Worsening thrombocytopenia with platelet at 23 this morning. Currently no active bleeding. Secondary to liver disease. -Monitor CBC -Platelet transfusion if started bleeding or if platelets continue to drop.  Alcoholic cirrhosis ([place]) GI consult. -Made lactulose schedule -Continue with rifaximin  Hepatic encephalopathy ([place]) Patient was alert and oriented.  Rising ammonia at 131. -Continue to monitor -Made lactulose scheduled twice daily instead of as needed -Continue with rifaximin  Electrolyte abnormality Patient with hypokalemia and hypomagnesemia. -Replete electrolytes and monitor  Severe protein-calorie malnutrition (HCC) Estimated body mass index is 24.03 kg/m as calculated from the following:   Height as of this encounter: 5' 4"  (1.626 m).   Weight as of this encounter: 63.5 kg.   -Dietitian consult  Transaminitis Secondary to alcoholic liver disease and cirrhosis. -Monitor liver function      DISCHARGE MEDICATION: Allergies as of 04/17/2022       Reactions   Latex Hives   Patient stated that she is allergic to latex        Medication List     ASK your doctor about these medications    ferrous sulfate 325 (65 FE) MG tablet  Take 1 tablet (325 mg total) by mouth daily with breakfast.   folic acid 1 MG tablet Commonly known as: FOLVITE Take 1 tablet (1 mg total) by mouth daily.   lactulose 10 GM/15ML solution Commonly known as: CHRONULAC Take 20 g by  mouth daily as needed for mild constipation.   multivitamin with minerals Tabs tablet Take 1 tablet by mouth daily.   pantoprazole 40 MG tablet Commonly known as: PROTONIX Take 1 tablet (40 mg total) by mouth 2 (two) times daily.   phytonadione 5 MG tablet Commonly known as: VITAMIN K Take 2 tablets (10 mg total) by mouth daily.   traZODone 150 MG tablet Commonly known as: DESYREL Take 150 mg by mouth at bedtime.   Xifaxan 550 MG Tabs tablet Generic drug: rifaximin Take 1 tablet (550 mg total) by mouth 2 (two) times daily.   zinc sulfate 220 (50 Zn) MG capsule Take 1 capsule (220 mg total) by mouth daily.        Discharge Exam: Filed Weights   04/16/22 0119  Weight: 63.5 kg     Condition at discharge:   The results of significant diagnostics from this hospitalization (including imaging, microbiology, ancillary and laboratory) are listed below for reference.   Imaging Studies: CT Head Wo Contrast  Result Date: 04/16/2022 CLINICAL DATA:  Mental status change, unknown cause. EXAM: CT HEAD WITHOUT CONTRAST TECHNIQUE: Contiguous axial images were obtained from the base of the skull through the vertex without intravenous contrast. RADIATION DOSE REDUCTION: This exam was performed according to the departmental dose-optimization program which includes automated exposure control, adjustment of the mA and/or kV according to patient size and/or use of iterative reconstruction technique. COMPARISON:  02/15/2022. FINDINGS: Brain: No acute intracranial hemorrhage, midline shift or mass effect. No extra-axial fluid collection. Gray-white matter differentiation is within normal limits. Mild generalized atrophy is noted. No hydrocephalus. Vascular: No hyperdense vessel or unexpected calcification. Skull: Normal. Negative for fracture or focal lesion. Sinuses/Orbits: No acute finding. Other: None. IMPRESSION: No acute intracranial process. Electronically Signed   By: Brett Fairy M.D.   On:  04/16/2022 04:09   DG Abdomen Acute W/Chest  Result Date: 04/16/2022 CLINICAL DATA:  Abdominal pain.  Concern for ruptured gastric ulcer. EXAM: DG ABDOMEN ACUTE WITH 1 VIEW CHEST COMPARISON:  CT 02/24/2022 FINDINGS: The cardiomediastinal contours are normal. The lungs are clear. No pneumomediastinum. There is no free intra-abdominal air. No dilated bowel loops to suggest obstruction. Mild gaseous distension of ascending and transverse colon, with formed stool in the distal colon. No abnormal rectal distention. Cholecystectomy clips in the right upper quadrant. Left pelvic calcification is a phlebolith. No radiopaque calculi. No acute osseous abnormalities are seen. IMPRESSION: 1. No free intra-abdominal air or evidence of bowel perforation. 2. Mild gaseous distension of ascending and transverse colon without evidence of obstruction or ileus. 3. No acute chest findings. Electronically Signed   By: Keith Rake M.D.   On: 04/16/2022 03:06    Microbiology: Results for orders placed or performed during the hospital encounter of 12/23/19  Respiratory Panel by RT PCR (Flu A&B, Covid) - Nasopharyngeal Swab     Status: None   Collection Time: 12/24/19  3:04 AM   Specimen: Nasopharyngeal Swab  Result Value Ref Range Status   SARS Coronavirus 2 by RT PCR NEGATIVE NEGATIVE Final    Comment: (NOTE) SARS-CoV-2 target nucleic acids are NOT DETECTED. The SARS-CoV-2 RNA is generally detectable in upper respiratoy specimens during the acute phase of infection. The lowest  concentration of SARS-CoV-2 viral copies this assay can detect is 131 copies/mL. A negative result does not preclude SARS-Cov-2 infection and should not be used as the sole basis for treatment or other patient management decisions. A negative result may occur with  improper specimen collection/handling, submission of specimen other than nasopharyngeal swab, presence of viral mutation(s) within the areas targeted by this assay, and  inadequate number of viral copies (<131 copies/mL). A negative result must be combined with clinical observations, patient history, and epidemiological information. The expected result is Negative. Fact Sheet for Patients:  PinkCheek.be Fact Sheet for Healthcare Providers:  GravelBags.it This test is not yet ap proved or cleared by the [place] FDA and  has been authorized for detection and/or diagnosis of SARS-CoV-2 by FDA under an Emergency Use Authorization (EUA). This EUA will remain  in effect (meaning this test can be used) for the duration of the COVID-19 declaration under Section 564(b)(1) of the Act, 21 U.S.C. section 360bbb-3(b)(1), unless the authorization is terminated or revoked sooner.    Influenza A by PCR NEGATIVE NEGATIVE Final   Influenza B by PCR NEGATIVE NEGATIVE Final    Comment: (NOTE) The Xpert Xpress SARS-CoV-2/FLU/RSV assay is intended as an aid in  the diagnosis of influenza from Nasopharyngeal swab specimens and  should not be used as a sole basis for treatment. Nasal washings and  aspirates are unacceptable for Xpert Xpress SARS-CoV-2/FLU/RSV  testing. Fact Sheet for Patients: PinkCheek.be Fact Sheet for Healthcare Providers: GravelBags.it This test is not yet approved or cleared by the [place] FDA and  has been authorized for detection and/or diagnosis of SARS-CoV-2 by  FDA under an Emergency Use Authorization (EUA). This EUA will remain  in effect (meaning this test can be used) for the duration of the  Covid-19 declaration under Section 564(b)(1) of the Act, 21  U.S.C. section 360bbb-3(b)(1), unless the authorization is  terminated or revoked. Performed at [facility], [place] [address] [place], [place] [address]   SARS Coronavirus 2 by RT PCR     Status: None   Collection Time: 12/24/19  3:04 AM  Result Value Ref  Range Status   SARS Coronavirus 2 NEGATIVE NEGATIVE Final    Comment: (NOTE) Result indicates the ABSENCE of SARS-CoV-2 RNA in the patient specimen.  The lowest concentration of SARS-CoV-2 viral copies this assay can detect in nasopharyngeal swab specimens is 500 copies / mL.  A negative result does not preclude SARS-CoV-2 infection and should not be used as the sole basis for patient management decisions. A negative result may occur with improper specimen collection / handling, submission of a specimen other than nasopharyngeal swab, presence of viral mutation(s) within the areas targeted by this assay, and inadequate number of viral copies (<500 copies / mL) present.  Negative results must be combined with clinical observations, patient history, and epidemiological information.  The expected result is NEGATIVE.  Patient Fact Sheet:  BlogSelections.co.uk   Provider Fact Sheet:  https://lucas.com/   This test is not yet approved or cleared by the [place] FDA and  has been authorized for  detection and/or diagnosis of SARS-CoV-2 by FDA under an Emergency Use Authorization (EUA).  This EUA will remain in effect (meaning this test can be used) for the duration of  the COVID-19 declaration under Section 564(b)(1) of the Act, 21 U.S.C. section 360bbb-3(b)(1), unless the authorization is terminated or revoked sooner Performed at [facility], [place] [address] [place], [place] [address]  Culture, blood (routine x 2)     Status: None   Collection Time: 12/26/19  3:08 PM   Specimen: BLOOD RIGHT HAND  Result Value Ref Range Status   Specimen Description BLOOD RIGHT HAND  Final   Special Requests   Final    BOTTLES DRAWN AEROBIC AND ANAEROBIC Blood Culture adequate volume   Culture   Final    NO GROWTH 5 DAYS Performed at [facility], [address] [place], [place] [address]    Report Status 12/31/2019 FINAL  Final   Culture, blood (routine x 2)     Status: None   Collection Time: 12/26/19  3:17 PM   Specimen: BLOOD RIGHT WRIST  Result Value Ref Range Status   Specimen Description BLOOD RIGHT WRIST  Final   Special Requests   Final    BOTTLES DRAWN AEROBIC AND ANAEROBIC Blood Culture adequate volume   Culture   Final    NO GROWTH 5 DAYS Performed at [facility], [place] [address], [place], [place] [address]    Report Status 12/31/2019 FINAL  Final  Culture, respiratory (non-expectorated)     Status: None   Collection Time: 12/31/19 12:14 PM   Specimen: Tracheal Aspirate; Respiratory  Result Value Ref Range Status   Specimen Description TRACHEAL ASPIRATE  Final   Special Requests NONE  Final   Gram Stain   Final    RARE WBC PRESENT,BOTH PMN AND MONONUCLEAR RARE BUDDING YEAST SEEN Performed at [facility], [place] [address] [place], [place] [address]    Culture RARE CANDIDA KEFYR  Final   Report Status 01/02/2020 FINAL  Final  MRSA PCR Screening     Status: None   Collection Time: 12/31/19  9:16 PM   Specimen: Nasal Mucosa; Nasopharyngeal  Result Value Ref Range Status   MRSA by PCR NEGATIVE NEGATIVE Final    Comment:        The GeneXpert MRSA Assay (FDA approved for NASAL specimens only), is one component of a comprehensive MRSA colonization surveillance program. It is not intended to diagnose MRSA infection nor to guide or monitor treatment for MRSA infections. Performed at [facility], [place] [address] [place], [place] [address]   Culture, respiratory (non-expectorated)     Status: None   Collection Time: 01/03/20 11:36 AM   Specimen: Tracheal Aspirate; Respiratory  Result Value Ref Range Status   Specimen Description TRACHEAL ASPIRATE  Final   Special Requests NONE  Final   Gram Stain   Final    MODERATE WBC PRESENT, PREDOMINANTLY PMN NO SQUAMOUS EPITHELIAL CELLS SEEN RARE BUDDING YEAST SEEN Performed at [facility], [address], [place],  [place] [address]    Culture FEW CANDIDA KEFYR  Final   Report Status 01/05/2020 FINAL  Final    Labs: CBC: No results for input(s): "WBC", "NEUTROABS", "HGB", "HCT", "MCV", "PLT" in the last 168 hours. Basic Metabolic Panel: No results for input(s): "NA", "K", "CL", "CO2", "GLUCOSE", "BUN", "CREATININE", "CALCIUM", "MG", "PHOS" in the last 168 hours. Liver Function Tests: No results for input(s): "AST", "ALT", "ALKPHOS", "BILITOT", "PROT", "ALBUMIN" in the last 168 hours. CBG: No results for input(s): "GLUCAP" in the last 168 hours.  Discharge time spent:    Signed: Lorella Nimrod, MD [facility] 04/29/2022

## 2022-05-03 ENCOUNTER — Encounter: Payer: Medicaid Other | Admitting: Gastroenterology

## 2022-06-18 ENCOUNTER — Inpatient Hospital Stay (HOSPITAL_COMMUNITY)
Admission: EM | Admit: 2022-06-18 | Discharge: 2022-07-03 | DRG: 871 | Disposition: A | Discharge status: Home / Self Care | Payer: Medicaid Other | Attending: Internal Medicine | Admitting: Internal Medicine

## 2022-06-18 ENCOUNTER — Emergency Department (HOSPITAL_COMMUNITY): Payer: Medicaid Other

## 2022-06-18 ENCOUNTER — Other Ambulatory Visit: Payer: Self-pay

## 2022-06-18 DIAGNOSIS — K7010 Alcoholic hepatitis without ascites: Secondary | ICD-10-CM | POA: Diagnosis not present

## 2022-06-18 DIAGNOSIS — G928 Other toxic encephalopathy: Secondary | ICD-10-CM | POA: Diagnosis present

## 2022-06-18 DIAGNOSIS — F10229 Alcohol dependence with intoxication, unspecified: Secondary | ICD-10-CM | POA: Diagnosis present

## 2022-06-18 DIAGNOSIS — N179 Acute kidney failure, unspecified: Secondary | ICD-10-CM | POA: Diagnosis present

## 2022-06-18 DIAGNOSIS — K7030 Alcoholic cirrhosis of liver without ascites: Secondary | ICD-10-CM | POA: Diagnosis present

## 2022-06-18 DIAGNOSIS — D684 Acquired coagulation factor deficiency: Secondary | ICD-10-CM | POA: Diagnosis present

## 2022-06-18 DIAGNOSIS — R7989 Other specified abnormal findings of blood chemistry: Secondary | ICD-10-CM

## 2022-06-18 DIAGNOSIS — F10931 Alcohol use, unspecified with withdrawal delirium: Secondary | ICD-10-CM | POA: Diagnosis present

## 2022-06-18 DIAGNOSIS — F19939 Other psychoactive substance use, unspecified with withdrawal, unspecified: Secondary | ICD-10-CM | POA: Diagnosis present

## 2022-06-18 DIAGNOSIS — I10 Essential (primary) hypertension: Secondary | ICD-10-CM | POA: Diagnosis present

## 2022-06-18 DIAGNOSIS — K920 Hematemesis: Secondary | ICD-10-CM | POA: Diagnosis not present

## 2022-06-18 DIAGNOSIS — K3189 Other diseases of stomach and duodenum: Secondary | ICD-10-CM | POA: Diagnosis not present

## 2022-06-18 DIAGNOSIS — G9340 Encephalopathy, unspecified: Secondary | ICD-10-CM | POA: Diagnosis present

## 2022-06-18 DIAGNOSIS — E872 Acidosis, unspecified: Secondary | ICD-10-CM | POA: Diagnosis present

## 2022-06-18 DIAGNOSIS — D62 Acute posthemorrhagic anemia: Secondary | ICD-10-CM | POA: Diagnosis not present

## 2022-06-18 DIAGNOSIS — K7682 Hepatic encephalopathy: Secondary | ICD-10-CM

## 2022-06-18 DIAGNOSIS — E441 Mild protein-calorie malnutrition: Secondary | ICD-10-CM | POA: Diagnosis present

## 2022-06-18 DIAGNOSIS — K922 Gastrointestinal hemorrhage, unspecified: Secondary | ICD-10-CM | POA: Diagnosis not present

## 2022-06-18 DIAGNOSIS — F10939 Alcohol use, unspecified with withdrawal, unspecified: Secondary | ICD-10-CM | POA: Diagnosis present

## 2022-06-18 DIAGNOSIS — I8510 Secondary esophageal varices without bleeding: Secondary | ICD-10-CM | POA: Diagnosis present

## 2022-06-18 DIAGNOSIS — Z781 Physical restraint status: Secondary | ICD-10-CM

## 2022-06-18 DIAGNOSIS — A4101 Sepsis due to Methicillin susceptible Staphylococcus aureus: Principal | ICD-10-CM | POA: Diagnosis present

## 2022-06-18 DIAGNOSIS — Z79899 Other long term (current) drug therapy: Secondary | ICD-10-CM

## 2022-06-18 DIAGNOSIS — K567 Ileus, unspecified: Secondary | ICD-10-CM | POA: Diagnosis not present

## 2022-06-18 DIAGNOSIS — R7881 Bacteremia: Secondary | ICD-10-CM | POA: Diagnosis not present

## 2022-06-18 DIAGNOSIS — F10231 Alcohol dependence with withdrawal delirium: Secondary | ICD-10-CM | POA: Diagnosis present

## 2022-06-18 DIAGNOSIS — J690 Pneumonitis due to inhalation of food and vomit: Secondary | ICD-10-CM | POA: Diagnosis not present

## 2022-06-18 DIAGNOSIS — Y906 Blood alcohol level of 120-199 mg/100 ml: Secondary | ICD-10-CM | POA: Diagnosis present

## 2022-06-18 DIAGNOSIS — F19931 Other psychoactive substance use, unspecified with withdrawal delirium: Secondary | ICD-10-CM | POA: Diagnosis not present

## 2022-06-18 DIAGNOSIS — Z811 Family history of alcohol abuse and dependence: Secondary | ICD-10-CM

## 2022-06-18 DIAGNOSIS — E876 Hypokalemia: Secondary | ICD-10-CM | POA: Diagnosis present

## 2022-06-18 DIAGNOSIS — R6521 Severe sepsis with septic shock: Secondary | ICD-10-CM | POA: Diagnosis present

## 2022-06-18 DIAGNOSIS — K7011 Alcoholic hepatitis with ascites: Secondary | ICD-10-CM | POA: Diagnosis not present

## 2022-06-18 DIAGNOSIS — Z9049 Acquired absence of other specified parts of digestive tract: Secondary | ICD-10-CM

## 2022-06-18 DIAGNOSIS — K2210 Ulcer of esophagus without bleeding: Secondary | ICD-10-CM | POA: Diagnosis not present

## 2022-06-18 DIAGNOSIS — D6959 Other secondary thrombocytopenia: Secondary | ICD-10-CM | POA: Diagnosis present

## 2022-06-18 DIAGNOSIS — Y741 Therapeutic (nonsurgical) and rehabilitative general hospital and personal-use devices associated with adverse incidents: Secondary | ICD-10-CM | POA: Diagnosis present

## 2022-06-18 DIAGNOSIS — E7220 Disorder of urea cycle metabolism, unspecified: Secondary | ICD-10-CM | POA: Diagnosis present

## 2022-06-18 DIAGNOSIS — K7031 Alcoholic cirrhosis of liver with ascites: Secondary | ICD-10-CM | POA: Diagnosis present

## 2022-06-18 DIAGNOSIS — T859XXA Unspecified complication of internal prosthetic device, implant and graft, initial encounter: Secondary | ICD-10-CM | POA: Diagnosis not present

## 2022-06-18 DIAGNOSIS — R4182 Altered mental status, unspecified: Principal | ICD-10-CM

## 2022-06-18 DIAGNOSIS — Z91040 Latex allergy status: Secondary | ICD-10-CM

## 2022-06-18 DIAGNOSIS — Z6824 Body mass index (BMI) 24.0-24.9, adult: Secondary | ICD-10-CM

## 2022-06-18 DIAGNOSIS — D6489 Other specified anemias: Secondary | ICD-10-CM | POA: Diagnosis present

## 2022-06-18 DIAGNOSIS — Z8249 Family history of ischemic heart disease and other diseases of the circulatory system: Secondary | ICD-10-CM

## 2022-06-18 DIAGNOSIS — E861 Hypovolemia: Secondary | ICD-10-CM | POA: Diagnosis present

## 2022-06-18 DIAGNOSIS — K766 Portal hypertension: Secondary | ICD-10-CM | POA: Diagnosis present

## 2022-06-18 DIAGNOSIS — R0902 Hypoxemia: Secondary | ICD-10-CM | POA: Diagnosis present

## 2022-06-18 DIAGNOSIS — B9561 Methicillin susceptible Staphylococcus aureus infection as the cause of diseases classified elsewhere: Secondary | ICD-10-CM | POA: Diagnosis not present

## 2022-06-18 DIAGNOSIS — F19239 Other psychoactive substance dependence with withdrawal, unspecified: Secondary | ICD-10-CM | POA: Diagnosis present

## 2022-06-18 DIAGNOSIS — F419 Anxiety disorder, unspecified: Secondary | ICD-10-CM | POA: Diagnosis present

## 2022-06-18 LAB — COMPREHENSIVE METABOLIC PANEL
ALT: 36 U/L (ref 0–44)
AST: 275 U/L — ABNORMAL HIGH (ref 15–41)
Albumin: 4 g/dL (ref 3.5–5.0)
Alkaline Phosphatase: 195 U/L — ABNORMAL HIGH (ref 38–126)
Anion gap: 22 — ABNORMAL HIGH (ref 5–15)
BUN: 13 mg/dL (ref 6–20)
CO2: 19 mmol/L — ABNORMAL LOW (ref 22–32)
Calcium: 8.3 mg/dL — ABNORMAL LOW (ref 8.9–10.3)
Chloride: 98 mmol/L (ref 98–111)
Creatinine, Ser: 1.41 mg/dL — ABNORMAL HIGH (ref 0.44–1.00)
GFR, Estimated: 47 mL/min — ABNORMAL LOW (ref >60)
Glucose, Bld: 131 mg/dL — ABNORMAL HIGH (ref 70–99)
Potassium: 2.5 mmol/L — CL (ref 3.5–5.1)
Sodium: 139 mmol/L (ref 135–145)
Total Bilirubin: 3.7 mg/dL — ABNORMAL HIGH (ref 0.3–1.2)
Total Protein: 10.5 g/dL — ABNORMAL HIGH (ref 6.5–8.1)

## 2022-06-18 LAB — CBC
HCT: 33.2 % — ABNORMAL LOW (ref 36.0–46.0)
Hemoglobin: 9.4 g/dL — ABNORMAL LOW (ref 12.0–15.0)
MCH: 21.5 pg — ABNORMAL LOW (ref 26.0–34.0)
MCHC: 28.3 g/dL — ABNORMAL LOW (ref 30.0–36.0)
MCV: 76 fL — ABNORMAL LOW (ref 80.0–100.0)
Platelets: 57 10*3/uL — ABNORMAL LOW (ref 150–400)
RBC: 4.37 MIL/uL (ref 3.87–5.11)
RDW: 28.2 % — ABNORMAL HIGH (ref 11.5–15.5)
WBC: 9 10*3/uL (ref 4.0–10.5)
nRBC: 0 % (ref 0.0–0.2)

## 2022-06-18 LAB — PROTIME-INR
INR: 1.7 — ABNORMAL HIGH (ref 0.8–1.2)
Prothrombin Time: 20.2 seconds — ABNORMAL HIGH (ref 11.4–15.2)

## 2022-06-18 LAB — ETHANOL: Alcohol, Ethyl (B): 158 mg/dL — ABNORMAL HIGH (ref <10)

## 2022-06-18 LAB — LACTIC ACID, PLASMA
Lactic Acid, Venous: 7 mmol/L (ref 0.5–1.9)
Lactic Acid, Venous: 7.4 mmol/L (ref 0.5–1.9)

## 2022-06-18 LAB — MAGNESIUM: Magnesium: 2 mg/dL (ref 1.7–2.4)

## 2022-06-18 LAB — ACETAMINOPHEN LEVEL: Acetaminophen (Tylenol), Serum: <10 ug/mL — ABNORMAL LOW (ref 10–30)

## 2022-06-18 LAB — I-STAT BETA HCG BLOOD, ED (MC, WL, AP ONLY): I-stat hCG, quantitative: <5 m[IU]/mL (ref <5)

## 2022-06-18 LAB — AMMONIA: Ammonia: 156 umol/L — ABNORMAL HIGH (ref 9–35)

## 2022-06-18 LAB — CBG MONITORING, ED: Glucose-Capillary: 138 mg/dL — ABNORMAL HIGH (ref 70–99)

## 2022-06-18 LAB — SALICYLATE LEVEL: Salicylate Lvl: <7 mg/dL — ABNORMAL LOW (ref 7.0–30.0)

## 2022-06-18 MED ORDER — LORAZEPAM 2 MG/ML IJ SOLN
0.0000 mg | Freq: Four times a day (QID) | INTRAMUSCULAR | Status: DC
  Administered 2022-06-18: 2 mg via INTRAVENOUS
  Administered 2022-06-19: 4 mg via INTRAVENOUS
  Filled 2022-06-18: qty 1
  Filled 2022-06-18: qty 2

## 2022-06-18 MED ORDER — THIAMINE MONONITRATE 100 MG PO TABS: 100.0000 mg | ORAL_TABLET | Freq: Every day | ORAL | Status: DC

## 2022-06-18 MED ORDER — THIAMINE HCL 100 MG/ML IJ SOLN
100.0000 mg | Freq: Every day | INTRAMUSCULAR | Status: DC
  Administered 2022-06-19 (×2): 100 mg via INTRAVENOUS
  Filled 2022-06-18 (×2): qty 2

## 2022-06-18 MED ORDER — LORAZEPAM 2 MG/ML IJ SOLN: 0.0000 mg | Freq: Two times a day (BID) | INTRAMUSCULAR | Status: DC

## 2022-06-18 MED ORDER — POTASSIUM CHLORIDE 10 MEQ/100ML IV SOLN
10.0000 meq | INTRAVENOUS | Status: AC
  Administered 2022-06-18 – 2022-06-19 (×3): 10 meq via INTRAVENOUS
  Filled 2022-06-18 (×3): qty 100

## 2022-06-18 MED ORDER — SODIUM CHLORIDE 0.9 % IV BOLUS
1000.0000 mL | Freq: Once | INTRAVENOUS | Status: AC
  Administered 2022-06-18: 1000 mL via INTRAVENOUS

## 2022-06-18 MED ORDER — LORAZEPAM 2 MG/ML IJ SOLN
INTRAMUSCULAR | Status: AC
  Administered 2022-06-18: 1 mg via INTRAVENOUS
  Filled 2022-06-18: qty 1

## 2022-06-18 MED ORDER — LACTATED RINGERS IV BOLUS
1000.0000 mL | Freq: Once | INTRAVENOUS | Status: AC
  Administered 2022-06-19: 1000 mL via INTRAVENOUS

## 2022-06-18 MED ORDER — THIAMINE HCL 100 MG/ML IJ SOLN: 500.0000 mg | Freq: Once | INTRAMUSCULAR | Status: DC

## 2022-06-18 MED ORDER — LORAZEPAM 1 MG PO TABS: 0.0000 mg | ORAL_TABLET | Freq: Two times a day (BID) | ORAL | Status: DC

## 2022-06-18 MED ORDER — LORAZEPAM 2 MG/ML IJ SOLN
1.0000 mg | Freq: Once | INTRAMUSCULAR | Status: AC
Start: 2022-06-18 — End: 2022-06-18
  Administered 2022-06-18: 1 mg via INTRAVENOUS
  Filled 2022-06-18: qty 1

## 2022-06-18 MED ORDER — SODIUM CHLORIDE 0.9 % IV SOLN: INTRAVENOUS | Status: AC

## 2022-06-18 MED ORDER — THIAMINE HCL 100 MG/ML IJ SOLN
500.0000 mg | Freq: Once | INTRAMUSCULAR | Status: AC
  Administered 2022-06-18: 500 mg via INTRAVENOUS
  Filled 2022-06-18: qty 5

## 2022-06-18 MED ORDER — LORAZEPAM 2 MG/ML IJ SOLN
1.0000 mg | Freq: Once | INTRAMUSCULAR | Status: AC
  Administered 2022-06-18: 1 mg via INTRAVENOUS

## 2022-06-18 MED ORDER — LORAZEPAM 1 MG PO TABS: 0.0000 mg | ORAL_TABLET | Freq: Four times a day (QID) | ORAL | Status: DC

## 2022-06-18 NOTE — ED Provider Notes (Signed)
[facility] DEPT Provider Note   CSN: [id number] Arrival date & time: 06/18/22  1823     History  Chief Complaint  Patient presents with   Weakness   HPI NGOC DETJEN is a 41 y.o. female with history of alcohol abuse disorder, alcoholic hepatitis, and Wernicke's encephalopathy presenting for weakness, vomiting and altered mental status.  During encounter patient was unable to answer questions. Cannot determine that she was in the hospital.  Was unable to tell me her name.  Nurse mentioned that she was brought in by EMS. Patient mentioned to EMS that she had been detoxing for a number of days at home from alcohol.  In the last few days she has been more weak confused and fatigued.    Weakness      Home Medications Prior to Admission medications   Medication Sig Start Date End Date Taking? Authorizing Provider  ferrous sulfate 325 (65 FE) MG tablet Take 1 tablet (325 mg total) by mouth daily with breakfast. 02/26/22 06/18/22  Donne Hazel, MD  folic acid (FOLVITE) 1 MG tablet Take 1 tablet (1 mg total) by mouth daily. 12/22/21   France Ravens, MD  lactulose [facility]) 10 GM/15ML solution Take 20 g by mouth daily as needed for mild constipation.    [provider]  Multiple Vitamin (MULTIVITAMIN WITH MINERALS) TABS tablet Take 1 tablet by mouth daily. 01/15/20   Angiulli, Lavon Paganini, PA-C  pantoprazole (PROTONIX) 40 MG tablet Take 1 tablet (40 mg total) by mouth 2 (two) times daily. 12/22/21   Erskine Emery, MD  phytonadione (VITAMIN K) 5 MG tablet Take 2 tablets (10 mg total) by mouth daily. 02/26/22   Mansouraty, Telford Nab., MD  rifaximin (XIFAXAN) 550 MG TABS tablet Take 1 tablet (550 mg total) by mouth 2 (two) times daily. 12/22/21   Erskine Emery, MD  traZODone (DESYREL) 150 MG tablet Take 150 mg by mouth at bedtime. 01/11/22   [provider]  zinc sulfate 220 (50 Zn) MG capsule Take 1 capsule (220 mg total) by mouth daily. 01/15/20    Angiulli, Lavon Paganini, PA-C      Allergies    Latex    Review of Systems   Review of Systems  Neurological:  Positive for weakness.    Physical Exam Updated Vital Signs BP (!) 146/83   Pulse (!) 136   Temp 98.5 F (36.9 C) (Oral)   Resp 20   Ht 5' 4"  (1.626 m)   Wt 66 kg   LMP  (LMP Unknown)   SpO2 99%   BMI 24.98 kg/m  Physical Exam Vitals and nursing note reviewed.  Constitutional:      Comments: Vomit noted on shirt and pants.  Patient confused, disoriented.  HENT:     Head: Normocephalic and atraumatic.     Mouth/Throat:     Mouth: Mucous membranes are moist.  Eyes:     General:        Right eye: No discharge.        Left eye: No discharge.     Conjunctiva/sclera: Conjunctivae normal.     Pupils: Pupils are equal, round, and reactive to light.  Cardiovascular:     Rate and Rhythm: Normal rate and regular rhythm.     Pulses: Normal pulses.     Heart sounds: Normal heart sounds.  Pulmonary:     Effort: Pulmonary effort is normal.     Breath sounds: Normal breath sounds.  Abdominal:  General: Abdomen is flat.     Palpations: Abdomen is soft.  Skin:    General: Skin is warm and dry.  Neurological:     Mental Status: She is lethargic.     Comments: Patient was not alert or oriented or aware of her surroundings.  Patient could not produce her name when asked.  Patient could not follow simple commands so cranial nerves were not able to be assessed for gait.  Pupils were equal round reactive to light without nystagmus.  Could not assess track tracking.  Was able to assess grip strength which was 4 out of 5 bilaterally.  Psychiatric:        Mood and Affect: Mood normal.     ED Results / Procedures / Treatments   Labs (all labs ordered are listed, but only abnormal results are displayed) Labs Reviewed  COMPREHENSIVE METABOLIC PANEL - Abnormal; Notable for the following components:      Result Value   Potassium 2.5 (*)    CO2 19 (*)    Glucose, Bld 131 (*)     Creatinine, Ser 1.41 (*)    Calcium 8.3 (*)    Total Protein 10.5 (*)    AST 275 (*)    Alkaline Phosphatase 195 (*)    Total Bilirubin 3.7 (*)    GFR, Estimated 47 (*)    Anion gap 22 (*)    All other components within normal limits  CBC - Abnormal; Notable for the following components:   Hemoglobin 9.4 (*)    HCT 33.2 (*)    MCV 76.0 (*)    MCH 21.5 (*)    MCHC 28.3 (*)    RDW 28.2 (*)    Platelets 57 (*)    All other components within normal limits  LACTIC ACID, PLASMA - Abnormal; Notable for the following components:   Lactic Acid, Venous 7.0 (*)    All other components within normal limits  SALICYLATE LEVEL - Abnormal; Notable for the following components:   Salicylate Lvl <1.9 (*)    All other components within normal limits  ACETAMINOPHEN LEVEL - Abnormal; Notable for the following components:   Acetaminophen (Tylenol), Serum <10 (*)    All other components within normal limits  AMMONIA - Abnormal; Notable for the following components:   Ammonia 156 (*)    All other components within normal limits  CBG MONITORING, ED - Abnormal; Notable for the following components:   Glucose-Capillary 138 (*)    All other components within normal limits  CULTURE, BLOOD (ROUTINE X 2)  CULTURE, BLOOD (ROUTINE X 2)  MAGNESIUM  LACTIC ACID, PLASMA  RAPID URINE DRUG SCREEN, HOSP PERFORMED  URINALYSIS, ROUTINE W REFLEX MICROSCOPIC  ETHANOL  PROTIME-INR  I-STAT BETA HCG BLOOD, ED ([facility], [facility], [facility] ONLY)    EKG EKG Interpretation  Date/Time:  Monday June 18 2022 22:00:56 EDT Ventricular Rate:  133 PR Interval:  121 QRS Duration: 103 QT Interval:  382 QTC Calculation: 569 R Axis:   33 Text Interpretation: Sinus tachycardia Borderline low voltage, extremity leads Prolonged QT interval Confirmed by Davonna Belling [phone]) on 06/18/2022 10:21:28 PM  Radiology CT Head Wo Contrast  Result Date: 06/18/2022 CLINICAL DATA:  Altered mental status, ETOH detox EXAM: CT HEAD WITHOUT  CONTRAST TECHNIQUE: Contiguous axial images were obtained from the base of the skull through the vertex without intravenous contrast. RADIATION DOSE REDUCTION: This exam was performed according to the departmental dose-optimization program which includes automated exposure control, adjustment of the mA  and/or kV according to patient size and/or use of iterative reconstruction technique. COMPARISON:  04/16/2022 FINDINGS: Brain: No evidence of acute infarction, hemorrhage, hydrocephalus, extra-axial collection or mass lesion/mass effect. Mild subcortical white matter and periventricular small vessel ischemic changes. Vascular: No hyperdense vessel or unexpected calcification. Skull: Normal. Negative for fracture or focal lesion. Sinuses/Orbits: The visualized paranasal sinuses are essentially clear. The mastoid air cells are unopacified. Other: None. IMPRESSION: No acute intracranial abnormality. Mild small vessel ischemic changes. Electronically Signed   By: Julian Hy M.D.   On: 06/18/2022 19:51    Procedures .Critical Care  Performed by: Harriet Pho, PA-C Authorized by: Harriet Pho, PA-C   Critical care provider statement:    Critical care time (minutes):  60   Critical care was necessary to treat or prevent imminent or life-threatening deterioration of the following conditions: severe alcohol withdrawal.   Critical care was time spent personally by me on the following activities:  Blood draw for specimens, development of treatment plan with patient or surrogate, examination of patient, interpretation of cardiac output measurements, review of old charts, pulse oximetry, ordering and review of radiographic studies, ordering and review of laboratory studies, ordering and performing treatments and interventions, evaluation of patient's response to treatment and re-evaluation of patient's condition   I assumed direction of critical care for this patient from another provider in my specialty:  yes       Medications Ordered in ED Medications  potassium chloride 10 mEq in 100 mL IVPB (10 mEq Intravenous New Bag/Given 06/18/22 2145)  LORazepam (ATIVAN) injection 0-4 mg (2 mg Intravenous Given 06/18/22 2149)    Or  LORazepam (ATIVAN) tablet 0-4 mg ( Oral See Alternative 06/18/22 2149)  LORazepam (ATIVAN) injection 0-4 mg (has no administration in time range)    Or  LORazepam (ATIVAN) tablet 0-4 mg (has no administration in time range)  thiamine (VITAMIN B1) tablet 100 mg (has no administration in time range)    Or  thiamine (VITAMIN B1) injection 100 mg (has no administration in time range)  lactated ringers bolus 1,000 mL (has no administration in time range)  thiamine (VITAMIN B1) 500 mg in sodium chloride 0.9 % 50 mL IVPB (500 mg Intravenous New Bag/Given 06/18/22 2128)  sodium chloride 0.9 % bolus 1,000 mL (1,000 mLs Intravenous New Bag/Given 06/18/22 2010)  LORazepam (ATIVAN) injection 1 mg (1 mg Intravenous Given 06/18/22 1936)  LORazepam (ATIVAN) injection 1 mg (1 mg Intravenous Given 06/18/22 2038)    ED Course/ Medical Decision Making/ A&P Clinical Course as of 06/18/22 2227  Mon Jun 18, 2022  2143 BP: 99/69 [JR]    Clinical Course User Index [JR] Harriet Pho, PA-C                           Medical Decision Making Amount and/or Complexity of Data Reviewed Labs: ordered. Radiology: ordered.   This patient presents to the ED for concern of altered mental status, this involves a number of treatment options, and is a complaint that carries with it a high risk of complications and morbidity.  The differential diagnosis includes Warnicke's encephalopathy, sepsis, electrolyte derangement, and alcohol or drug intoxication.   Co morbidities: Discussed in HPI   EMR reviewed including pt PMHx, past surgical history and past visits to ER.   See HPI for more details   Lab Tests:  I ordered and independently interpreted labs. Labs notable for lactic acidosis,  hypokalemia, hyperammonemia  Imaging Studies:  NAD. I personally reviewed all imaging studies and no acute abnormality found. I agree with radiology interpretation.    Cardiac Monitoring:  The patient was maintained on a cardiac monitor.  I personally viewed and interpreted the cardiac monitored which showed an underlying rhythm of: Sinus tachycardia EKG non-ischemic   Medicines ordered:  I ordered medication including thiamine for concern for Warnicke's encephalopathy, Reevaluation of the patient after these medicines showed that the patient stayed the same I have reviewed the patients home medicines and have made adjustments as needed   Critical Interventions:  Treatment for severe alcohol withdrawal and hepatic encephalopathy   Consults/Attending Physician   I discussed this case with my attending physician who cosigned this note including patient's presenting symptoms, physical exam, and planned diagnostics and interventions. Attending physician stated agreement with plan or made changes to plan which were implemented.  Signed out patient to attending ED physician.   Reevaluation:  After the interventions noted above I re-evaluated patient and found that they have :stayed the same    Problem List / ED Course: Patient presented for altered mental status.  Per EMS, patient had stopped drinking couple days ago.  Has a history of alcohol abuse.  During encounter, patient was not alert nor oriented and extremely confused.  Considered sepsis but unlikely given no fever and normal white count.  Ammonia level was 156.  AMS could be related to hepatic encephalopathy given the severe elevation of ammonia.  Patient also had a lactic acidosis, tachycardic and hypotensive.  Treated with 2 L bolus.  ED physician will follow-up on lactate recheck.  Patient did return to a more lucid state and was more agitated and began attempting to leave the emergency department shouting and waving  her arms.  Treated with Ativan.  Pending labs will be followed up by attending ED physician, Dr. Alvino Chapel.   After consideration of the diagnostic results and the patients response to treatment, I feel that the patent would benefit from admission to the hospital for severe alcohol withdrawal and likely hepatic encephalopathy.  Signed out patient to Dr. Alvino Chapel.  Plan will be to admit to the ICU.         Final Clinical Impression(s) / ED Diagnoses Final diagnoses:  Altered mental status, unspecified altered mental status type    Rx / DC Orders ED Discharge Orders     None         Harriet Pho, PA-C 06/18/22 2227    Davonna Belling, MD 06/18/22 2342

## 2022-06-18 NOTE — ED Notes (Signed)
Pt sticking her fingers down her throat to try and make herself vomit. Mittens applied.

## 2022-06-18 NOTE — ED Notes (Addendum)
Pt transported to CT ?

## 2022-06-18 NOTE — ED Notes (Signed)
Pt returned from CT °

## 2022-06-18 NOTE — ED Triage Notes (Signed)
Patient brought in from home by EMS. States she has been self detoxing from alcohol for a couple of days do not know date of last drink but is now experiencing weakness, confusion and fatigue.  99% 116 128/76 CBG 159

## 2022-06-19 ENCOUNTER — Inpatient Hospital Stay (HOSPITAL_COMMUNITY): Payer: Medicaid Other

## 2022-06-19 ENCOUNTER — Inpatient Hospital Stay: Payer: Self-pay

## 2022-06-19 DIAGNOSIS — R4182 Altered mental status, unspecified: Secondary | ICD-10-CM | POA: Diagnosis not present

## 2022-06-19 DIAGNOSIS — F19939 Other psychoactive substance use, unspecified with withdrawal, unspecified: Secondary | ICD-10-CM | POA: Diagnosis present

## 2022-06-19 DIAGNOSIS — K7030 Alcoholic cirrhosis of liver without ascites: Secondary | ICD-10-CM

## 2022-06-19 LAB — URINALYSIS, ROUTINE W REFLEX MICROSCOPIC
Bilirubin Urine: NEGATIVE
Glucose, UA: NEGATIVE mg/dL
Hgb urine dipstick: NEGATIVE
Ketones, ur: 5 mg/dL — AB
Nitrite: NEGATIVE
Protein, ur: 30 mg/dL — AB
Specific Gravity, Urine: 1.012 (ref 1.005–1.030)
pH: 6 (ref 5.0–8.0)

## 2022-06-19 LAB — ECHOCARDIOGRAM COMPLETE
AR max vel: 2.78 cm2
AV Area VTI: 2.77 cm2
AV Area mean vel: 2.67 cm2
AV Mean grad: 5 mmHg
AV Peak grad: 9.9 mmHg
Ao pk vel: 1.57 m/s
Area-P 1/2: 3.99 cm2
Calc EF: 63.9 %
Height: 64 in
MV M vel: 2.28 m/s
MV Peak grad: 20.8 mmHg
S' Lateral: 3.2 cm
Single Plane A2C EF: 59.5 %
Single Plane A4C EF: 67.2 %
Weight: 2486.79 oz

## 2022-06-19 LAB — BLOOD CULTURE ID PANEL (REFLEXED) - BCID2

## 2022-06-19 LAB — COMPREHENSIVE METABOLIC PANEL
ALT: 25 U/L (ref 0–44)
AST: 198 U/L — ABNORMAL HIGH (ref 15–41)
Albumin: 2.9 g/dL — ABNORMAL LOW (ref 3.5–5.0)
Alkaline Phosphatase: 143 U/L — ABNORMAL HIGH (ref 38–126)
Anion gap: 9 (ref 5–15)
BUN: 9 mg/dL (ref 6–20)
CO2: 22 mmol/L (ref 22–32)
Calcium: 7.2 mg/dL — ABNORMAL LOW (ref 8.9–10.3)
Chloride: 112 mmol/L — ABNORMAL HIGH (ref 98–111)
Creatinine, Ser: 0.69 mg/dL (ref 0.44–1.00)
GFR, Estimated: >60 mL/min (ref >60)
Glucose, Bld: 91 mg/dL (ref 70–99)
Potassium: 3.6 mmol/L (ref 3.5–5.1)
Sodium: 143 mmol/L (ref 135–145)
Total Bilirubin: 3.9 mg/dL — ABNORMAL HIGH (ref 0.3–1.2)
Total Protein: 7.4 g/dL (ref 6.5–8.1)

## 2022-06-19 LAB — RAPID URINE DRUG SCREEN, HOSP PERFORMED
Amphetamines: NOT DETECTED
Barbiturates: NOT DETECTED
Benzodiazepines: POSITIVE — AB
Cocaine: NOT DETECTED
Opiates: NOT DETECTED
Tetrahydrocannabinol: NOT DETECTED

## 2022-06-19 LAB — CBC
HCT: 27 % — ABNORMAL LOW (ref 36.0–46.0)
HCT: 26 % — ABNORMAL LOW (ref 36.0–46.0)
HCT: 26.2 % — ABNORMAL LOW (ref 36.0–46.0)
Hemoglobin: 7.2 g/dL — ABNORMAL LOW (ref 12.0–15.0)
Hemoglobin: 7 g/dL — ABNORMAL LOW (ref 12.0–15.0)
Hemoglobin: 7.1 g/dL — ABNORMAL LOW (ref 12.0–15.0)
MCH: 21.7 pg — ABNORMAL LOW (ref 26.0–34.0)
MCH: 22.2 pg — ABNORMAL LOW (ref 26.0–34.0)
MCH: 21.9 pg — ABNORMAL LOW (ref 26.0–34.0)
MCHC: 26.7 g/dL — ABNORMAL LOW (ref 30.0–36.0)
MCHC: 26.9 g/dL — ABNORMAL LOW (ref 30.0–36.0)
MCHC: 27.1 g/dL — ABNORMAL LOW (ref 30.0–36.0)
MCV: 81.3 fL (ref 80.0–100.0)
MCV: 82.3 fL (ref 80.0–100.0)
MCV: 80.9 fL (ref 80.0–100.0)
Platelets: 28 10*3/uL — CL (ref 150–400)
Platelets: 21 10*3/uL — CL (ref 150–400)
Platelets: 21 10*3/uL — CL (ref 150–400)
RBC: 3.32 MIL/uL — ABNORMAL LOW (ref 3.87–5.11)
RBC: 3.16 MIL/uL — ABNORMAL LOW (ref 3.87–5.11)
RBC: 3.24 MIL/uL — ABNORMAL LOW (ref 3.87–5.11)
RDW: 28.2 % — ABNORMAL HIGH (ref 11.5–15.5)
RDW: 28.1 % — ABNORMAL HIGH (ref 11.5–15.5)
RDW: 28.3 % — ABNORMAL HIGH (ref 11.5–15.5)
WBC: 6.5 10*3/uL (ref 4.0–10.5)
WBC: 3.4 10*3/uL — ABNORMAL LOW (ref 4.0–10.5)
WBC: 2.4 10*3/uL — ABNORMAL LOW (ref 4.0–10.5)
nRBC: 0 % (ref 0.0–0.2)
nRBC: 0 % (ref 0.0–0.2)
nRBC: 0.8 % — ABNORMAL HIGH (ref 0.0–0.2)

## 2022-06-19 LAB — PROCALCITONIN: Procalcitonin: <0.1 ng/mL

## 2022-06-19 LAB — LACTIC ACID, PLASMA
Lactic Acid, Venous: 6.6 mmol/L (ref 0.5–1.9)
Lactic Acid, Venous: 6.2 mmol/L (ref 0.5–1.9)
Lactic Acid, Venous: 2.1 mmol/L (ref 0.5–1.9)

## 2022-06-19 LAB — CK TOTAL AND CKMB (NOT AT ARMC)
CK, MB: 2.1 ng/mL (ref 0.5–5.0)
Relative Index: 1.4 (ref 0.0–2.5)
Total CK: 149 U/L (ref 38–234)

## 2022-06-19 LAB — BASIC METABOLIC PANEL
Anion gap: 17 — ABNORMAL HIGH (ref 5–15)
BUN: 10 mg/dL (ref 6–20)
CO2: 18 mmol/L — ABNORMAL LOW (ref 22–32)
Calcium: 7.3 mg/dL — ABNORMAL LOW (ref 8.9–10.3)
Chloride: 107 mmol/L (ref 98–111)
Creatinine, Ser: 0.81 mg/dL (ref 0.44–1.00)
GFR, Estimated: >60 mL/min (ref >60)
Glucose, Bld: 103 mg/dL — ABNORMAL HIGH (ref 70–99)
Potassium: 2.8 mmol/L — ABNORMAL LOW (ref 3.5–5.1)
Sodium: 142 mmol/L (ref 135–145)

## 2022-06-19 LAB — GLUCOSE, CAPILLARY
Glucose-Capillary: 101 mg/dL — ABNORMAL HIGH (ref 70–99)
Glucose-Capillary: 88 mg/dL (ref 70–99)
Glucose-Capillary: 76 mg/dL (ref 70–99)
Glucose-Capillary: 93 mg/dL (ref 70–99)

## 2022-06-19 LAB — MRSA NEXT GEN BY PCR, NASAL: MRSA by PCR Next Gen: NOT DETECTED

## 2022-06-19 MED ORDER — DEXMEDETOMIDINE HCL IN NACL 200 MCG/50ML IV SOLN
0.2000 ug/kg/h | INTRAVENOUS | Status: DC
  Administered 2022-06-19 (×2): 0.2 ug/kg/h via INTRAVENOUS
  Filled 2022-06-19: qty 50

## 2022-06-19 MED ORDER — METHYLPREDNISOLONE SODIUM SUCC 40 MG IJ SOLR
40.0000 mg | Freq: Every day | INTRAMUSCULAR | Status: DC
  Administered 2022-06-19: 40 mg via INTRAVENOUS
  Filled 2022-06-19 (×2): qty 1

## 2022-06-19 MED ORDER — CHLORHEXIDINE GLUCONATE CLOTH 2 % EX PADS
6.0000 | MEDICATED_PAD | Freq: Every day | CUTANEOUS | Status: DC
  Administered 2022-06-19: 6 via TOPICAL

## 2022-06-19 MED ORDER — LORAZEPAM 1 MG PO TABS: 0.0000 mg | ORAL_TABLET | ORAL | Status: DC | PRN

## 2022-06-19 MED ORDER — DEXTROSE IN LACTATED RINGERS 5 % IV SOLN: INTRAVENOUS | Status: DC

## 2022-06-19 MED ORDER — SODIUM CHLORIDE 0.9 % IV SOLN: 250.0000 mL | INTRAVENOUS | Status: DC

## 2022-06-19 MED ORDER — CEFAZOLIN SODIUM-DEXTROSE 2-4 GM/100ML-% IV SOLN
2.0000 g | Freq: Three times a day (TID) | INTRAVENOUS | Status: DC
  Administered 2022-06-19 – 2022-06-25 (×17): 2 g via INTRAVENOUS
  Filled 2022-06-19 (×17): qty 100

## 2022-06-19 MED ORDER — FOLIC ACID 1 MG PO TABS
1.0000 mg | ORAL_TABLET | Freq: Every day | ORAL | Status: DC
  Administered 2022-06-19: 1 mg via ORAL
  Filled 2022-06-19: qty 1

## 2022-06-19 MED ORDER — LIP MEDEX EX OINT
TOPICAL_OINTMENT | CUTANEOUS | Status: DC | PRN
  Filled 2022-06-19: qty 7

## 2022-06-19 MED ORDER — PHENYLEPHRINE HCL-NACL 20-0.9 MG/250ML-% IV SOLN
INTRAVENOUS | Status: AC
  Administered 2022-06-19: 25 ug/min via INTRAVENOUS
  Filled 2022-06-19: qty 250

## 2022-06-19 MED ORDER — PHENYLEPHRINE HCL-NACL 20-0.9 MG/250ML-% IV SOLN
25.0000 ug/min | INTRAVENOUS | Status: DC
  Administered 2022-06-19: 25 ug/min via INTRAVENOUS
  Filled 2022-06-19: qty 250

## 2022-06-19 MED ORDER — PANTOPRAZOLE SODIUM 40 MG IV SOLR
40.0000 mg | Freq: Every day | INTRAVENOUS | Status: DC
  Administered 2022-06-19: 40 mg via INTRAVENOUS
  Filled 2022-06-19: qty 10

## 2022-06-19 MED ORDER — LACTATED RINGERS IV BOLUS
1000.0000 mL | Freq: Once | INTRAVENOUS | Status: AC
  Administered 2022-06-19: 1000 mL via INTRAVENOUS

## 2022-06-19 MED ORDER — SODIUM CHLORIDE 0.9 % IV SOLN: INTRAVENOUS | Status: DC | PRN

## 2022-06-19 MED ORDER — DOCUSATE SODIUM 50 MG/5ML PO LIQD: 100.0000 mg | Freq: Two times a day (BID) | ORAL | Status: DC | PRN

## 2022-06-19 MED ORDER — LACTULOSE 10 GM/15ML PO SOLN
30.0000 g | Freq: Three times a day (TID) | ORAL | Status: DC
  Administered 2022-06-19: 30 g via ORAL
  Filled 2022-06-19: qty 45

## 2022-06-19 MED ORDER — RIFAXIMIN 550 MG PO TABS
550.0000 mg | ORAL_TABLET | Freq: Two times a day (BID) | ORAL | Status: DC
  Administered 2022-06-19 – 2022-06-29 (×21): 550 mg
  Filled 2022-06-19 (×21): qty 1

## 2022-06-19 MED ORDER — LORAZEPAM 2 MG/ML IJ SOLN
2.0000 mg | Freq: Once | INTRAMUSCULAR | Status: AC
  Administered 2022-06-19: 2 mg via INTRAVENOUS
  Filled 2022-06-19: qty 1

## 2022-06-19 MED ORDER — POTASSIUM CHLORIDE 10 MEQ/100ML IV SOLN
10.0000 meq | INTRAVENOUS | Status: AC
  Administered 2022-06-19 (×8): 10 meq via INTRAVENOUS
  Filled 2022-06-19 (×5): qty 100

## 2022-06-19 MED ORDER — DOCUSATE SODIUM 100 MG PO CAPS: 100.0000 mg | ORAL_CAPSULE | Freq: Two times a day (BID) | ORAL | Status: DC | PRN

## 2022-06-19 MED ORDER — ORAL CARE MOUTH RINSE: 15.0000 mL | OROMUCOSAL | Status: DC | PRN

## 2022-06-19 MED ORDER — RIFAXIMIN 550 MG PO TABS
550.0000 mg | ORAL_TABLET | Freq: Two times a day (BID) | ORAL | Status: DC
  Administered 2022-06-19: 550 mg via ORAL
  Filled 2022-06-19: qty 1

## 2022-06-19 MED ORDER — ONDANSETRON HCL 4 MG/2ML IJ SOLN
4.0000 mg | Freq: Four times a day (QID) | INTRAMUSCULAR | Status: DC | PRN
  Administered 2022-06-27: 4 mg via INTRAVENOUS
  Filled 2022-06-19: qty 2

## 2022-06-19 MED ORDER — LORAZEPAM 1 MG PO TABS
0.0000 mg | ORAL_TABLET | ORAL | Status: DC | PRN
  Administered 2022-06-24: 2 mg
  Filled 2022-06-19: qty 2

## 2022-06-19 MED ORDER — DEXMEDETOMIDINE HCL IN NACL 200 MCG/50ML IV SOLN
0.2000 ug/kg/h | INTRAVENOUS | Status: DC
  Administered 2022-06-19: 0.3 ug/kg/h via INTRAVENOUS
  Administered 2022-06-19: 0.2 ug/kg/h via INTRAVENOUS
  Administered 2022-06-20: 1.1 ug/kg/h via INTRAVENOUS
  Administered 2022-06-20: 0.3 ug/kg/h via INTRAVENOUS
  Administered 2022-06-20: 1.1 ug/kg/h via INTRAVENOUS
  Administered 2022-06-21: 0.4 ug/kg/h via INTRAVENOUS
  Filled 2022-06-19 (×2): qty 50
  Filled 2022-06-19: qty 100
  Filled 2022-06-19 (×3): qty 50

## 2022-06-19 MED ORDER — LORAZEPAM 2 MG/ML IJ SOLN
0.0000 mg | INTRAMUSCULAR | Status: DC | PRN
  Administered 2022-06-19: 4 mg via INTRAVENOUS
  Administered 2022-06-19 (×2): 2 mg via INTRAVENOUS
  Filled 2022-06-19: qty 2
  Filled 2022-06-19 (×2): qty 1

## 2022-06-19 MED ORDER — ENOXAPARIN SODIUM 40 MG/0.4ML IJ SOSY: 40.0000 mg | PREFILLED_SYRINGE | INTRAMUSCULAR | Status: DC

## 2022-06-19 MED ORDER — NOREPINEPHRINE 4 MG/250ML-% IV SOLN
INTRAVENOUS | Status: AC
  Filled 2022-06-19: qty 250

## 2022-06-19 MED ORDER — LORAZEPAM 2 MG/ML IJ SOLN
0.0000 mg | INTRAMUSCULAR | Status: DC | PRN
  Administered 2022-06-20 (×2): 1 mg via INTRAVENOUS
  Administered 2022-06-20: 3 mg via INTRAVENOUS
  Administered 2022-06-21: 4 mg via INTRAVENOUS
  Administered 2022-06-21 (×4): 2 mg via INTRAVENOUS
  Administered 2022-06-21: 4 mg via INTRAVENOUS
  Administered 2022-06-22: 2 mg via INTRAVENOUS
  Administered 2022-06-22 (×2): 4 mg via INTRAVENOUS
  Administered 2022-06-22 (×2): 2 mg via INTRAVENOUS
  Administered 2022-06-22: 4 mg via INTRAVENOUS
  Administered 2022-06-23: 2 mg via INTRAVENOUS
  Administered 2022-06-23: 4 mg via INTRAVENOUS
  Administered 2022-06-23: 2 mg via INTRAVENOUS
  Administered 2022-06-24 (×3): 4 mg via INTRAVENOUS
  Administered 2022-06-24: 2 mg via INTRAVENOUS
  Administered 2022-06-25: 4 mg via INTRAVENOUS
  Administered 2022-06-25: 3 mg via INTRAVENOUS
  Filled 2022-06-19: qty 1
  Filled 2022-06-19 (×3): qty 2
  Filled 2022-06-19: qty 1
  Filled 2022-06-19 (×2): qty 2
  Filled 2022-06-19: qty 1
  Filled 2022-06-19 (×2): qty 2
  Filled 2022-06-19 (×4): qty 1
  Filled 2022-06-19: qty 2
  Filled 2022-06-19 (×2): qty 1
  Filled 2022-06-19: qty 2
  Filled 2022-06-19 (×2): qty 1
  Filled 2022-06-19 (×2): qty 2
  Filled 2022-06-19: qty 1

## 2022-06-19 MED ORDER — POLYETHYLENE GLYCOL 3350 17 G PO PACK: 17.0000 g | PACK | Freq: Every day | ORAL | Status: DC | PRN

## 2022-06-19 MED ORDER — LACTULOSE 10 GM/15ML PO SOLN
30.0000 g | Freq: Three times a day (TID) | ORAL | Status: DC
  Administered 2022-06-19 – 2022-06-20 (×5): 30 g
  Filled 2022-06-19 (×5): qty 45

## 2022-06-19 MED ORDER — FOLIC ACID 1 MG PO TABS
1.0000 mg | ORAL_TABLET | Freq: Every day | ORAL | Status: DC
  Administered 2022-06-20 – 2022-06-29 (×10): 1 mg
  Filled 2022-06-19 (×10): qty 1

## 2022-06-19 NOTE — Progress Notes (Signed)
   Call from [facility] has red lock and we are not to give medical info to the husband.  Have updated sign out     SIGNATURE    Dr. Brand Males, M.D., F.C.C.P,  Pulmonary and Critical Care Medicine Staff Physician, [place] Director - Interstitial Lung Disease  Program  Medical Director - [facility] [place] [facility] [place], [place], [address]   Pager: [phone], If no answer  -[place] or [place] [phone] Telephone (clinical office): [phone] Telephone (research): [phone]  6:56 PM 06/19/2022

## 2022-06-19 NOTE — Progress Notes (Signed)
[facility] Progress Note Patient Name: Joann [name] DOB: 05-Dec-1980 MRN: [id number]   Date of Service  06/19/2022  HPI/Events of Note  Patient with a history of ETOH abuse, ETOH liver disease,  Wernicke's encephalopathy admitted with altered mental status possibly secondary to ETOH intoxication and evolving ETOH withdrawal, she will need concurrent work up to exclude a complicating infectious etiology.  eICU Interventions  New Patient Evaluation. Will order Precedex gtt and CIWA protocols pending assessment by [employer].        [name] 06/19/2022, 2:42 AM

## 2022-06-19 NOTE — H&P (Signed)
NAME:  Joann [name], MRN:  [id number], DOB:  Aug 03, 1981, LOS: 1 ADMISSION DATE:  06/18/2022, CONSULTATION DATE:  06/19/22 REFERRING MD:  EDP, CHIEF COMPLAINT:  acute etoh withdrawal   History of Present Illness:  41 yo with pmh chronic etoh abuse with h/o dt and severe etoh withdrawal, anxiety, hepatic envephalopathy, alcoholic cirrhosis, htn who presented with desire for alcohol cessation. She is non verbal on my exam but moving around and sitting up trying to get out of bed so h/o is obtained from chart review and EDP.   Per report pt had been attempting to detox at home for the past couple days, although etoh level is 158. She was unable to disclose her date of last drink but is now having confusion, weakness and fatigue. It is unclear if she has been compliant with her medications at home as well, however with her ammonia at 150's doubtful.    She is unable to provide any further ros or history.   Pertinent  Medical History  Etoh abuse, hepatic encephalopathy, alcoholic cirrhosis, htn, anxiety, h/o DT  Significant Hospital Events: Including procedures, antibiotic start and stop dates in addition to other pertinent events   Admitted to ICU 10/10  Interim History / Subjective:    Objective   Blood pressure 109/67, pulse (!) 121, temperature 98.3 F (36.8 C), temperature source Oral, resp. rate (!) 22, height 5' 4"  (1.626 m), weight 66 kg, SpO2 100 %.        Intake/Output Summary (Last 24 hours) at 06/19/2022 0243 Last data filed at 06/19/2022 0125 Gross per 24 hour  Intake 310.61 ml  Output --  Net 310.61 ml   Filed Weights   06/18/22 1834  Weight: 66 kg    Examination: General: non verbal but awake moving around in bed, will track HENT: ncat, eomi, perrla, mmmp Lungs: ctab Cardiovascular: tachycardic but sinus Abdomen: soft nt,nd bs+ Extremities: no c/c/e, + tremor Neuro: encephalopathic, not following commands, moving spontaneously all over bed, tremulous GU:  deferred  Resolved Hospital Problem list     Assessment & Plan:  Acute etoh withdrawal:  -given ativan with temporary improvement but cont to have grossly positive ciwa -will need to start precedex infusion -have held on starting phenobarb in light of known cirrhosis with hepatic dysfunction and hepatic encephalopathy without ability to give lactulose at this time.  -h/o severe withdrawal with DT -close monitoring for airway protection.  -once able to calm enough will attempt NG placement for lactulose dosing.  -thiamine, folate  Hepatic encephalopathy:  -lactulose when able to place ng -rifaxamin should be resumed as well when able.  -ammonia 476 Alcoholic cirrhosis:  -noted -INR 1.7 Thrombocytopenia:  -chronic  Lactic acidosiS:  -trend  Aki:  -baseline Cr 0.5 -1.4 today will follow indices and uop  Tachycardia:  -give additional bolus, only given one thus far -resume maintenance fluids once complete  Hypokalemia:  -replace  Best Practice (right click and "Reselect all SmartList Selections" daily)   Diet/type: NPO DVT prophylaxis: SCD GI prophylaxis: N/A Lines: N/A Foley:  N/A Code Status:  full code Last date of multidisciplinary goals of care discussion [pending discussion with family]  Labs   CBC: Recent Labs  Lab 06/18/22 1929  WBC 9.0  HGB 9.4*  HCT 33.2*  MCV 76.0*  PLT 57*    Basic Metabolic Panel: Recent Labs  Lab 06/18/22 1929  NA 139  K 2.5*  CL 98  CO2 19*  GLUCOSE 131*  BUN 13  CREATININE 1.41*  CALCIUM 8.3*  MG 2.0   GFR: Estimated Creatinine Clearance: 48.1 mL/min (A) (by C-G formula based on SCr of 1.41 mg/dL (H)). Recent Labs  Lab 06/18/22 1929 06/18/22 2109  WBC 9.0  --   LATICACIDVEN 7.0* 7.4*    Liver Function Tests: Recent Labs  Lab 06/18/22 1929  AST 275*  ALT 36  ALKPHOS 195*  BILITOT 3.7*  PROT 10.5*  ALBUMIN 4.0   No results for input(s): "LIPASE", "AMYLASE" in the last 168 hours. Recent Labs   Lab 06/18/22 1929  AMMONIA 156*    ABG    Component Value Date/Time   PHART 7.448 12/31/2019 0826   PCO2ART 38.2 12/31/2019 0826   PO2ART 346 (H) 12/31/2019 0826   HCO3 26.3 12/31/2019 0826   TCO2 27 12/31/2019 0826   ACIDBASEDEF 2.5 (H) 12/30/2019 1655   O2SAT 100.0 12/31/2019 0826     Coagulation Profile: Recent Labs  Lab 06/18/22 2230  INR 1.7*    Cardiac Enzymes: No results for input(s): "CKTOTAL", "CKMB", "CKMBINDEX", "TROPONINI" in the last 168 hours.  HbA1C: Hgb A1c MFr Bld  Date/Time Value Ref Range Status  09/29/2018 03:23 PM 4.8 4.8 - 5.6 % Final    Comment:             Prediabetes: 5.7 - 6.4          Diabetes: >6.4          Glycemic control for adults with diabetes: <7.0     CBG: Recent Labs  Lab 06/18/22 1931  GLUCAP 138*    Review of Systems:   Unobtainable 2/2 encephalopathy  Past Medical History:  She,  has a past medical history of [redacted] weeks gestation of pregnancy (03/22/2020), Alcohol abuse, Alcohol withdrawal delirium ([place]) (02/15/2022), Alcohol-induced acute pancreatitis, Alcoholic ketoacidosis (76/54/6503), Anxiety, DTs (delirium tremens) ([place]), Elevated LFTs (10/24/2018), Encephalopathy acute, Hepatic encephalopathy ([place]) (12/24/2019), Hypertension, Hypokalemia (04/02/2019), Hypotension (02/24/2022), and Liver disease.   Surgical History:   Past Surgical History:  Procedure Laterality Date   ECTOPIC PREGNANCY SURGERY     ESOPHAGOGASTRODUODENOSCOPY (EGD) WITH PROPOFOL N/A 12/27/2019   Procedure: ESOPHAGOGASTRODUODENOSCOPY (EGD) WITH PROPOFOL;  Surgeon: Carol Ada, MD;  Location: [facility];  Service: Endoscopy;  Laterality: N/A;     Social History:   reports that she has never smoked. She has never used smokeless tobacco. She reports current alcohol use. She reports that she does not use drugs.   Family History:  Her family history includes Alcohol abuse in her father and mother; Diabetes in her father; Healthy in her brother, brother,  brother, and mother; Hypertension in her maternal grandfather and maternal grandmother.   Allergies Allergies  Allergen Reactions   Latex Hives    Patient stated that she is allergic to latex     Home Medications  Prior to Admission medications   Medication Sig Start Date End Date Taking? Authorizing Provider  ferrous sulfate 325 (65 FE) MG tablet Take 1 tablet (325 mg total) by mouth daily with breakfast. 02/26/22 06/18/22  Donne Hazel, MD  folic acid (FOLVITE) 1 MG tablet Take 1 tablet (1 mg total) by mouth daily. 12/22/21   France Ravens, MD  lactulose [facility]) 10 GM/15ML solution Take 20 g by mouth daily as needed for mild constipation.    [provider]  Multiple Vitamin (MULTIVITAMIN WITH MINERALS) TABS tablet Take 1 tablet by mouth daily. 01/15/20   Angiulli, Lavon Paganini, PA-C  pantoprazole (PROTONIX) 40 MG tablet Take 1 tablet (40 mg  total) by mouth 2 (two) times daily. 12/22/21   Erskine Emery, MD  phytonadione (VITAMIN K) 5 MG tablet Take 2 tablets (10 mg total) by mouth daily. 02/26/22   Mansouraty, Telford Nab., MD  rifaximin (XIFAXAN) 550 MG TABS tablet Take 1 tablet (550 mg total) by mouth 2 (two) times daily. 12/22/21   Erskine Emery, MD  traZODone (DESYREL) 150 MG tablet Take 150 mg by mouth at bedtime. 01/11/22   [provider]  zinc sulfate 220 (50 Zn) MG capsule Take 1 capsule (220 mg total) by mouth daily. 01/15/20   Angiulli, Lavon Paganini, PA-C     Critical care time: 74mn excluding procedures.

## 2022-06-19 NOTE — Progress Notes (Signed)
[facility] Progress Note Patient Name: Joann [name] DOB: 31-Jul-1981 MRN: [id number]   Date of Service  06/19/2022  HPI/Events of Note  Patient needs restraints order renewed to prevent pulling out of essential tubes and lines.  eICU Interventions  Restraints order renewed.        Kerry Kass Beverly Ferner 06/19/2022, 11:08 PM

## 2022-06-19 NOTE — Progress Notes (Signed)
An USGPIV (ultrasound guided PIV) has been placed for short-term vasopressor infusion. A correctly placed ivWatch must be used when administering Vasopressors. Should this treatment be needed beyond 72 hours, central line access should be obtained.  It will be the responsibility of the bedside nurse to follow best practice to prevent extravasations.   ?

## 2022-06-19 NOTE — Progress Notes (Signed)
Spoke with primary RN for this pt regarding PICC placement. Made aware the PICC will be placed 10/11. Currently has 3 PIV's functioning properly. RN in agreement with plan.

## 2022-06-19 NOTE — Progress Notes (Signed)
  Echocardiogram 2D Echocardiogram has been performed.  Joann [name] 06/19/2022, 3:20 PM

## 2022-06-19 NOTE — H&P (Signed)
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 She is non verbal on my exam but moving around and sitting up trying to get out of bed so h/o is obtained from chart review and EDP.   Per report pt had been attempting to detox at home for the past couple days, although etoh level is 158. She was unable to disclose her date of last drink but is now having confusion, weakness and fatigue. It is unclear if she has been compliant with her medications at home as well, however with her ammonia at 150's doubtful.    She is unable to provide any further ros or history.   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 Admitted on prexedex gtt and neo gtt added.  Curerntly off.  On 2LNC . Afebrile. Normal WBC. Lactate > 6 this am with very ppor clearance t hough AKI resolved. Platelets down to 28K (baseline 23K- 55k). LFTs are c/w Acute alcholic hepatitis. Discrimiant functin score 26.7   Objective   Blood pressure 108/65, pulse 81, temperature 98.4 F (36.9 C), temperature source Axillary,  resp. rate (!) 25, height 5' 4"  (1.626 m), weight 70.5 kg, SpO2 100 %.        Intake/Output Summary (Last 24 hours) at 06/19/2022 0956 Last data filed at 06/19/2022 3785 Gross per 24 hour  Intake 1694.16 ml  Output 350 ml  Net 1344.16 ml   Filed Weights   06/18/22 1834 06/19/22 0200  Weight: 66 kg 70.5 kg     General Appearance:  Looks criticall ill Head:  Normocephalic, without obvious abnormality, atraumatic Eyes:  PERRL - yes, conjunctiva/corneas - mild jaundice +     Ears:  Normal external ear canals, both ears Nose:  G tube - no but has [place] Throat:  ETT TUBE - no , OG tube - no Neck:  Supple,  No enlargement/tenderness/nodules Lungs: Clear to auscultation bilaterally,  Heart:  S1 and S2 normal, no murmur, CVP - no.  Pressors - no Abdomen:  Soft, no masses, no organomegaly Genitalia / Rectal:  Not done Extremities:  Extremities- intact Skin:  ntact in exposed areas . Sacral area - not examined Neurologic:  Sedation - currently none -> RASS - -4 to +2 flutates . Moves all 4s - yes. CAM-ICU - postive . Orientation - not      Resolved Hospital Problem list     Assessment & Plan:     AT risk for intubation due to acute agitated enceophalopathy  06/19/2022 -> currently protecting airway  P:   Intubate  if worse O2 for pulse ox goal > 92% Get cxr    Acute agitated encephalopathy due to DTs/ETOH withdrawal  10/10 -fluctating and currently off preceddx  P:   Precredex gtt Ativan CIWA protocol Check ammonia Lactulseo/Xifaxan - when can take po RASS goal o to -2   Circulatory shock at admit due to precedex  10/10 - off pressors   P:  MAP goal > 65 Maintenance fluids   No prior hx of cardiac diease but at risk for etoh cardiomyopathy  P: Get echo  CARDIAC ELECTRICAL A: Sinus tachy   P: teele     No evidence for infection  P:   Check PCT and monitor   AKI at admit   10/10 -resolved  P:  monitor   Sevre lactic acidosis at  admit -   10/10 - poor resolution  P: Check ck for rhabdo Fluid bolus Recheck lacate     Alcoholic cirrhosis NOS - Prior to & Present on Admit Acute alcoholic intoxication- Present on Admit Acute alcoholic hepatitis - Present on Admit (disc score 26.7); normal tylenol land sal evel at admit   P:   Start IV steroids - track Lille score on day 7 at 06/26/22 Lactulose  Start xifaxan Check ammonia Thiamine/Folic Get RUQ [place] NG tube when feasible   Chronic anemia - hgb 7.5 - severe - Prior to & Present on Admit  10/10 - no active bleed  P:  - PRBC for hgb </= 6.9gm%    - exceptions are   -  if ACS susepcted/confirmed then transfuse for hgb </= 8.0gm%,  or    -  active bleeding with hemodynamic instability, then transfuse regardless of hemoglobin value   At at all times try to transfuse 1 unit prbc as possible with exception of active hemorrhage    Moderate - severe thrombocytopenia at baseline   P monitor   At risk for hypoglycemia   P:   monitor  MSK/DERM Baseline moderate to severe PCM - alb 2.8 in Aig 20203  Plan - monitpr   Best Practice (right click and "Reselect all SmartList Selections" daily)   Diet/type: NPO DVT prophylaxis: SCD GI prophylaxis: PPI Lines: N/A Foley:  N/A Code Status:  full code Last date of multidisciplinary goals of care discussion [pending discussion with family] Joann [name]  [phone]. He says he called the ER 2h ago 06/19/2022 and was informed "patient is not here". He is upset that ER did not inform him about whereabouts. He says that no one called him yesterday but for admissions nurse last night. He says he was not aware patient is in ICU.  He is upset about this.  He then wanted me to call her Joann [name] mother [phone]-> no answer at 11:07 AM       ATTESTATION & SIGNATURE   The patient Joann [name] is critically ill with multiple organ systems failure and requires high complexity decision making for  assessment and support, frequent evaluation and titration of therapies, application of advanced monitoring technologies and extensive interpretation of multiple databases.   Critical Care Time devoted to patient care services described in this note is  65  Minutes. This time reflects time of care of this signee Dr Brand Males. This critical care time does not reflect procedure time, or teaching time or supervisory time of PA/NP/Med student/Med Resident etc but could involve care discussion time     Dr. Brand Males, M.D., [facility].C.P Pulmonary and Critical Care  Medicine Medical Director - [facility] ICU Staff Physician, [place] [facility] Pager: [phone], If no answer or between  15:00h - 7:00h: call [phone]  06/19/2022 9:58 AM   LABS    PULMONARY No results for input(s): "PHART", "PCO2ART", "PO2ART", "HCO3", "TCO2", "O2SAT" in the last 168 hours.  Invalid input(s): "PCO2", "PO2"  CBC Recent Labs  Lab 06/18/22 1929 06/19/22 0553  HGB 9.4* 7.2*  HCT 33.2* 27.0*  WBC 9.0 6.5  PLT 57* 28*    COAGULATION Recent Labs  Lab 06/18/22 2230  INR 1.7*    CARDIAC  No results for input(s): "TROPONINI" in the last 168 hours. No results for input(s): "PROBNP" in the last 168 hours.   CHEMISTRY Recent Labs  Lab 06/18/22 1929 06/19/22 0258  NA 139 142  K 2.5* 2.8*  CL 98 107  CO2 19* 18*  GLUCOSE 131* 103*  BUN 13 10  CREATININE 1.41* 0.81  CALCIUM 8.3* 7.3*  MG 2.0  --    Estimated Creatinine Clearance: 86.2 mL/min (by C-G formula based on SCr of 0.81 mg/dL).   LIVER Recent Labs  Lab 06/18/22 1929 06/18/22 2230  AST 275*  --   ALT 36  --   ALKPHOS 195*  --   BILITOT 3.7*  --   PROT 10.5*  --   ALBUMIN 4.0  --   INR  --  1.7*     INFECTIOUS Recent Labs  Lab 06/18/22 2109 06/19/22 0258 06/19/22 0553  LATICACIDVEN 7.4* 6.6* 6.2*     ENDOCRINE CBG (last 3)  Recent Labs     06/18/22 1931 06/19/22 0246 06/19/22 0550  GLUCAP 138* 101* 88         IMAGING x48h  - image(s) personally visualized  -   highlighted in bold CT Head Wo Contrast  Result Date: 06/18/2022 CLINICAL DATA:  Altered mental status, ETOH detox EXAM: CT HEAD WITHOUT CONTRAST TECHNIQUE: Contiguous axial images were obtained from the base of the skull through the vertex without intravenous contrast. RADIATION DOSE REDUCTION: This exam was performed according to the departmental dose-optimization program which includes automated exposure control, adjustment of the mA and/or kV according to patient size and/or use of iterative reconstruction technique. COMPARISON:  04/16/2022 FINDINGS: Brain: No evidence of acute infarction, hemorrhage, hydrocephalus, extra-axial collection or mass lesion/mass effect. Mild subcortical white matter and periventricular small vessel ischemic changes. Vascular: No hyperdense vessel or unexpected calcification. Skull: Normal. Negative for fracture or focal lesion. Sinuses/Orbits: The visualized paranasal sinuses are essentially clear. The mastoid air cells are unopacified. Other: None. IMPRESSION: No acute intracranial abnormality. Mild small vessel ischemic changes. Electronically Signed   By: Julian Hy M.D.   On: 06/18/2022 19:51

## 2022-06-19 NOTE — Progress Notes (Signed)
Advised by lab technician to reorder CBC.

## 2022-06-19 NOTE — Progress Notes (Signed)
eLink Physician-Brief Progress Note Patient Name: Joann [name] DOB: [birth year] MRN: [id number]   Date of Service  06/19/2022  HPI/Events of Note  Patient with a drop in blood pressure to an SBP of 69 after being started on Precedex gtt and receiving several iv Ativan boluses for severe agitation, Precedex was discontinued and patient stimulated, with rise in BP 120/56, MAP of 70, but it is currently 81/48, MAP of 57, patient received a total of close to 3 liters of iv fluids, Heart rate is currently 110.   eICU Interventions  Peripheral Phenylephrine gtt ordered to keep MAP > 65 mmHg.        Kerry Kass Jvion Turgeon 06/19/2022, 4:04 AM

## 2022-06-19 NOTE — Progress Notes (Signed)
PHARMACY - PHYSICIAN COMMUNICATION CRITICAL VALUE ALERT - BLOOD CULTURE IDENTIFICATION (BCID)  Joann [name] is an 40 y.o. female who presented to [facility] on [facility]/18/2022 with a chief complaint of  Chief Complaint  Patient presents with   Weakness     Assessment:  severe ETOH withdrawal    Name of physician (or Provider) Contacted: Dr. Chase Caller  Current antibiotics: None  Changes to prescribed antibiotics recommended:  - Start Ancef 2 gr IV q8h   Results for orders placed or performed during the hospital encounter of 06/18/22  Blood Culture ID Panel (Reflexed) (Collected: 06/18/2022 10:34 PM)  Result Value Ref Range   Enterococcus faecalis NOT DETECTED NOT DETECTED   Enterococcus Faecium NOT DETECTED NOT DETECTED   Listeria monocytogenes NOT DETECTED NOT DETECTED   Staphylococcus species DETECTED (A) NOT DETECTED   Staphylococcus aureus (BCID) DETECTED (A) NOT DETECTED   Staphylococcus epidermidis NOT DETECTED NOT DETECTED   Staphylococcus lugdunensis NOT DETECTED NOT DETECTED   Streptococcus species NOT DETECTED NOT DETECTED   Streptococcus agalactiae NOT DETECTED NOT DETECTED   Streptococcus pneumoniae NOT DETECTED NOT DETECTED   Streptococcus pyogenes NOT DETECTED NOT DETECTED   A.calcoaceticus-baumannii NOT DETECTED NOT DETECTED   Bacteroides fragilis NOT DETECTED NOT DETECTED   Enterobacterales NOT DETECTED NOT DETECTED   Enterobacter cloacae complex NOT DETECTED NOT DETECTED   Escherichia coli NOT DETECTED NOT DETECTED   Klebsiella aerogenes NOT DETECTED NOT DETECTED   Klebsiella oxytoca NOT DETECTED NOT DETECTED   Klebsiella pneumoniae NOT DETECTED NOT DETECTED   Proteus species NOT DETECTED NOT DETECTED   Salmonella species NOT DETECTED NOT DETECTED   Serratia marcescens NOT DETECTED NOT DETECTED   Haemophilus influenzae NOT DETECTED NOT DETECTED   Neisseria meningitidis NOT DETECTED NOT DETECTED   Pseudomonas aeruginosa NOT DETECTED NOT DETECTED    Stenotrophomonas maltophilia NOT DETECTED NOT DETECTED   Candida albicans NOT DETECTED NOT DETECTED   Candida auris NOT DETECTED NOT DETECTED   Candida glabrata NOT DETECTED NOT DETECTED   Candida krusei NOT DETECTED NOT DETECTED   Candida parapsilosis NOT DETECTED NOT DETECTED   Candida tropicalis NOT DETECTED NOT DETECTED   Cryptococcus neoformans/gattii NOT DETECTED NOT DETECTED   Meth resistant mecA/C and MREJ NOT DETECTED NOT DETECTED    Royetta Asal, PharmD, BCPS 06/19/2022 5:56 PM

## 2022-06-19 NOTE — Progress Notes (Signed)
L:AB REVIEW  Lacteate improved Imaging unremarklable but for cirrhosois Platelet and cbc down  Plan   - repeat cbc at 9pm and if < 7 give prbc    SIGNATURE    Dr. Brand Males, M.D., F.C.C.P,  Pulmonary and Critical Care Medicine Staff Physician, [place] Director - Interstitial Lung Disease  Program  Medical Director - [facility] [place] [facility] [place], [place], [address]   Pager: [phone], If no answer  -[place] or Try [phone] Telephone (clinical office): [phone] Telephone (research): [phone]  4:25 PM 06/19/2022    LABS    PULMONARY No results for input(s): "PHART", "PCO2ART", "PO2ART", "HCO3", "TCO2", "O2SAT" in the last 168 hours.  Invalid input(s): "PCO2", "PO2"  CBC Recent Labs  Lab 06/18/22 1929 06/19/22 0553 06/19/22 1257  HGB 9.4* 7.2* 7.0*  HCT 33.2* 27.0* 26.0*  WBC 9.0 6.5 3.4*  PLT 57* 28* 21*    COAGULATION Recent Labs  Lab 06/18/22 2230  INR 1.7*    CARDIAC  No results for input(s): "TROPONINI" in the last 168 hours. No results for input(s): "PROBNP" in the last 168 hours.   CHEMISTRY Recent Labs  Lab 06/18/22 1929 06/19/22 0258 06/19/22 1257  NA 139 142 143  K 2.5* 2.8* 3.6  CL 98 107 112*  CO2 19* 18* 22  GLUCOSE 131* 103* 91  BUN 13 10 9   CREATININE 1.41* 0.81 0.69  CALCIUM 8.3* 7.3* 7.2*  MG 2.0  --   --    Estimated Creatinine Clearance: 87.3 mL/min (by C-G formula based on SCr of 0.69 mg/dL).   LIVER Recent Labs  Lab 06/18/22 1929 06/18/22 2230 06/19/22 1257  AST 275*  --  198*  ALT 36  --  25  ALKPHOS 195*  --  143*  BILITOT 3.7*  --  3.9*  PROT 10.5*  --  7.4  ALBUMIN 4.0  --  2.9*  INR  --  1.7*  --      INFECTIOUS Recent Labs  Lab 06/19/22 0258 06/19/22 0553 06/19/22 1230 06/19/22 1257  LATICACIDVEN 6.6* 6.2* 2.1*  --   PROCALCITON  --   --   --  <0.10     ENDOCRINE CBG (last  3)  Recent Labs    06/19/22 0550 06/19/22 1132 06/19/22 1545  GLUCAP 88 76 93         IMAGING x48h  - image(s) personally visualized  -   highlighted in bold [place] EKG SITE RITE  Result Date: 06/19/2022 If Site Rite image not attached, placement could not be confirmed due to current cardiac rhythm.  US Abdomen Limited RUQ (LIVER/GB)  Result Date: 06/19/2022 CLINICAL DATA:  Cirrhosis EXAM: ULTRASOUND ABDOMEN LIMITED RIGHT UPPER QUADRANT COMPARISON:  CT abdomen pelvis 02/24/2022 FINDINGS: Gallbladder: Surgically absent Common bile duct: Diameter: 2.7 mm Liver: Increased echogenicity liver diffusely. Nodular liver contour compatible with cirrhosis. No liver mass. Suboptimal evaluation of portal vein. Patient not able to hold still. Other: None. IMPRESSION: Postop cholecystectomy.  No biliary dilatation Cirrhosis liver Electronically Signed   By: Franchot Gallo M.D.   On: 06/19/2022 15:46   ECHOCARDIOGRAM COMPLETE  Result Date: 06/19/2022    ECHOCARDIOGRAM REPORT   Patient Name:   Joann [name] Date of Exam: 06/19/2022 Medical Rec #:  [id number]   Height:       64.0 in Accession #:    [id number]  Weight:  155.4 lb Date of Birth:  07/24/1981   BSA:          1.758 m Patient Age:    41 years    BP:           103/53 mmHg Patient Gender: F           HR:           73 bpm. Exam Location:  Inpatient Procedure: 2D Echo Indications:    Cirrhosis  History:        Patient has no prior history of Echocardiogram examinations.  Sonographer:    Harvie Junior Referring Phys: [place]  1. Left ventricular ejection fraction, by estimation, is 60 to 65%. The left ventricle has normal function. The left ventricle has no regional wall motion abnormalities. Left ventricular diastolic parameters were normal.  2. Right ventricular systolic function is normal. The right ventricular size is normal. There is normal pulmonary artery systolic pressure.  3. The mitral valve is normal in structure. No  evidence of mitral valve regurgitation. No evidence of mitral stenosis.  4. The aortic valve is tricuspid. Aortic valve regurgitation is not visualized. No aortic stenosis is present. Comparison(s): No prior Echocardiogram. Conclusion(s)/Recommendation(s): Normal biventricular function without evidence of hemodynamically significant valvular heart disease. FINDINGS  Left Ventricle: Left ventricular ejection fraction, by estimation, is 60 to 65%. The left ventricle has normal function. The left ventricle has no regional wall motion abnormalities. The left ventricular internal cavity size was normal in size. There is  no left ventricular hypertrophy. Left ventricular diastolic parameters were normal. Right Ventricle: The right ventricular size is normal. No increase in right ventricular wall thickness. Right ventricular systolic function is normal. There is normal pulmonary artery systolic pressure. The tricuspid regurgitant velocity is 1.75 m/s, and  with an assumed right atrial pressure of 3 mmHg, the estimated right ventricular systolic pressure is 83.3 mmHg. Left Atrium: Left atrial size was normal in size. Right Atrium: Right atrial size was normal in size. Pericardium: There is no evidence of pericardial effusion. Mitral Valve: The mitral valve is normal in structure. No evidence of mitral valve regurgitation. No evidence of mitral valve stenosis. Tricuspid Valve: The tricuspid valve is normal in structure. Tricuspid valve regurgitation is not demonstrated. No evidence of tricuspid stenosis. Aortic Valve: The aortic valve is tricuspid. Aortic valve regurgitation is not visualized. No aortic stenosis is present. Aortic valve mean gradient measures 5.0 mmHg. Aortic valve peak gradient measures 9.9 mmHg. Aortic valve area, by VTI measures 2.77 cm. Pulmonic Valve: The pulmonic valve was normal in structure. Pulmonic valve regurgitation is not visualized. No evidence of pulmonic stenosis. Aorta: The aortic root is  normal in size and structure. IAS/Shunts: No atrial level shunt detected by color flow Doppler.  LEFT VENTRICLE PLAX 2D LVIDd:         5.00 cm      Diastology LVIDs:         3.20 cm      LV e' medial:    10.70 cm/s LV PW:         0.70 cm      LV E/e' medial:  8.1 LV IVS:        0.70 cm      LV e' lateral:   13.10 cm/s LVOT diam:     2.10 cm      LV E/e' lateral: 6.6 LV SV:         86 LV SV Index:  49 LVOT Area:     3.46 cm  LV Volumes (MOD) LV vol d, MOD A2C: 111.0 ml LV vol d, MOD A4C: 121.0 ml LV vol s, MOD A2C: 45.0 ml LV vol s, MOD A4C: 39.7 ml LV SV MOD A2C:     66.0 ml LV SV MOD A4C:     121.0 ml LV SV MOD BP:      74.9 ml RIGHT VENTRICLE RV Basal diam:  3.10 cm RV Mid diam:    2.50 cm RV S prime:     14.50 cm/s TAPSE (M-mode): 2.7 cm LEFT ATRIUM             Index        RIGHT ATRIUM           Index LA diam:        3.40 cm 1.93 cm/m   RA Area:     10.90 cm LA Vol (A2C):   43.7 ml 24.86 ml/m  RA Volume:   24.50 ml  13.94 ml/m LA Vol (A4C):   40.4 ml 22.99 ml/m LA Biplane Vol: 44.8 ml 25.49 ml/m  AORTIC VALVE                     PULMONIC VALVE AV Area (Vmax):    2.78 cm      PV Vmax:       1.14 m/s AV Area (Vmean):   2.67 cm      PV Peak grad:  5.2 mmHg AV Area (VTI):     2.77 cm AV Vmax:           157.00 cm/s AV Vmean:          105.000 cm/s AV VTI:            0.311 m AV Peak Grad:      9.9 mmHg AV Mean Grad:      5.0 mmHg LVOT Vmax:         126.00 cm/s LVOT Vmean:        81.000 cm/s LVOT VTI:          0.249 m LVOT/AV VTI ratio: 0.80  AORTA Ao Root diam: 3.40 cm MITRAL VALVE               TRICUSPID VALVE MV Area (PHT): 3.99 cm    TR Peak grad:   12.2 mmHg MV Decel Time: 190 msec    TR Vmax:        175.00 cm/s MR Peak grad: 20.8 mmHg MR Vmax:      228.00 cm/s  SHUNTS MV E velocity: 87.00 cm/s  Systemic VTI:  0.25 m MV A velocity: 52.50 cm/s  Systemic Diam: 2.10 cm MV E/A ratio:  1.66 Rudean Haskell MD Electronically signed by Rudean Haskell MD Signature Date/Time: 06/19/2022/3:39:11 PM     Final    DG Abd 1 View  Result Date: 06/19/2022 CLINICAL DATA:  Feeding tube placement EXAM: ABDOMEN - 1 VIEW COMPARISON:  04/16/2022 FINDINGS: A weighted tip feeding tube has been advanced, tip near the pylorus. The stomach is decompressed. Visualized bowel gas pattern unremarkable; lower abdomen excluded. Cholecystectomy clips noted. IMPRESSION: Feeding tube tip near the pylorus. Electronically Signed   By: Lucrezia Europe M.D.   On: 06/19/2022 14:34   DG CHEST PORT 1 VIEW  Result Date: 06/19/2022 CLINICAL DATA:  Chronic alcohol abuse presenting with alcohol withdrawal. EXAM: PORTABLE CHEST 1 VIEW COMPARISON:  Chest x-ray February 23, 2022 FINDINGS: The cardiomediastinal  silhouette is unchanged in contour. Low lung volumes with bronchovascular crowding. No focal pulmonary opacity. No pleural effusion or pneumothorax. Visualized upper abdomen is unremarkable. No acute osseous abnormality. IMPRESSION: No acute cardiopulmonary abnormality. Electronically Signed   By: Beryle Flock M.D.   On: 06/19/2022 11:59   CT Head Wo Contrast  Result Date: 06/18/2022 CLINICAL DATA:  Altered mental status, ETOH detox EXAM: CT HEAD WITHOUT CONTRAST TECHNIQUE: Contiguous axial images were obtained from the base of the skull through the vertex without intravenous contrast. RADIATION DOSE REDUCTION: This exam was performed according to the departmental dose-optimization program which includes automated exposure control, adjustment of the mA and/or kV according to patient size and/or use of iterative reconstruction technique. COMPARISON:  04/16/2022 FINDINGS: Brain: No evidence of acute infarction, hemorrhage, hydrocephalus, extra-axial collection or mass lesion/mass effect. Mild subcortical white matter and periventricular small vessel ischemic changes. Vascular: No hyperdense vessel or unexpected calcification. Skull: Normal. Negative for fracture or focal lesion. Sinuses/Orbits: The visualized paranasal sinuses are  essentially clear. The mastoid air cells are unopacified. Other: None. IMPRESSION: No acute intracranial abnormality. Mild small vessel ischemic changes. Electronically Signed   By: Julian Hy M.D.   On: 06/18/2022 19:51

## 2022-06-19 NOTE — ED Notes (Signed)
ED TO INPATIENT HANDOFF REPORT  ED Nurse Name and Phone #: Elpidio Eric [phone]  S Name/Age/Gender Joann [name] 41 y.o. female Room/Bed: WA15/WA15  Code Status   Code Status: Full Code  Home/SNF/Other Home Patient oriented to: self Is this baseline? No   Triage Complete: Triage complete  Chief Complaint Encephalopathy [G93.40] Acute drug withdrawal syndrome [facility]) [F19.939]  Triage Note Patient brought in from home by EMS. States she has been self detoxing from alcohol for a couple of days do not know date of last drink but is now experiencing weakness, confusion and fatigue.  99% 116 128/76 CBG 159   Allergies Allergies  Allergen Reactions   Latex Hives    Patient stated that she is allergic to latex    Level of Care/Admitting Diagnosis ED Disposition     ED Disposition  Admit   Condition  --   [place]: [place] [[id number]]  Level of Care: ICU [6]  May admit patient to [facility] or [facility] if equivalent level of care is available:: Yes  Covid Evaluation: Asymptomatic - no recent exposure (last 10 days) testing not required  Diagnosis: Acute drug withdrawal syndrome [facility]) [[id number]]  Admitting Physician: Audria Nine [[id number]]  Attending Physician: Audria Nine [[id number]]  Certification:: I certify this patient will need inpatient services for at least 2 midnights          B Medical/Surgery History Past Medical History:  Diagnosis Date   [redacted] weeks gestation of pregnancy 03/22/2020   Alcohol abuse    Alcohol withdrawal delirium ([place]) 02/15/2022   Alcohol-induced acute pancreatitis    Alcoholic ketoacidosis 35/70/1779   Anxiety    DTs (delirium tremens) ([place])    Elevated LFTs 10/24/2018   Encephalopathy acute    Hepatic encephalopathy ([place]) 12/24/2019   Hypertension    Hypokalemia 04/02/2019   Hypotension 02/24/2022   Liver disease    Past Surgical History:  Procedure Laterality Date   ECTOPIC  PREGNANCY SURGERY     ESOPHAGOGASTRODUODENOSCOPY (EGD) WITH PROPOFOL N/A 12/27/2019   Procedure: ESOPHAGOGASTRODUODENOSCOPY (EGD) WITH PROPOFOL;  Surgeon: Carol Ada, MD;  Location: [facility];  Service: Endoscopy;  Laterality: N/A;     A IV Location/Drains/Wounds Patient Lines/Drains/Airways Status     Active Line/Drains/Airways     Name Placement date Placement time Site Days   Peripheral IV 06/18/22 20 G Right Antecubital 06/18/22  1936  Antecubital  1   Peripheral IV 06/19/22 20 G Left;Posterior Hand 06/19/22  0042  Hand  less than 1   Incision (Closed) 07/06/19 Abdomen Right;Upper 07/06/19  0847  -- 1079            Intake/Output Last 24 hours  Intake/Output Summary (Last 24 hours) at 06/19/2022 0123 Last data filed at 06/18/2022 2158 Gross per 24 hour  Intake 34.36 ml  Output --  Net 34.36 ml    Labs/Imaging Results for orders placed or performed during the hospital encounter of 06/18/22 (from the past 48 hour(s))  Comprehensive metabolic panel     Status: Abnormal   Collection Time: 06/18/22  7:29 PM  Result Value Ref Range   Sodium 139 135 - 145 mmol/L   Potassium 2.5 (LL) 3.5 - 5.1 mmol/L    Comment: CRITICAL RESULT CALLED TO, READ BACK BY AND VERIFIED WITH GRAY S. RN @2003  ON 06/18/22 BY KERLANDIA C.    Chloride 98 98 - 111 mmol/L   CO2 19 (L) 22 - 32 mmol/L   Glucose, Bld  131 (H) 70 - 99 mg/dL    Comment: Glucose reference range applies only to samples taken after fasting for at least 8 hours.   BUN 13 6 - 20 mg/dL   Creatinine, Ser 1.41 (H) 0.44 - 1.00 mg/dL   Calcium 8.3 (L) 8.9 - 10.3 mg/dL   Total Protein 10.5 (H) 6.5 - 8.1 g/dL   Albumin 4.0 3.5 - 5.0 g/dL   AST 275 (H) 15 - 41 U/L   ALT 36 0 - 44 U/L   Alkaline Phosphatase 195 (H) 38 - 126 U/L   Total Bilirubin 3.7 (H) 0.3 - 1.2 mg/dL   GFR, Estimated 47 (L) >60 mL/min    Comment: (NOTE) Calculated using the CKD-EPI Creatinine Equation (2021)    Anion gap 22 (H) 5 - 15    Comment:  Performed at [facility], [place] [address], [place], [place] [address]  CBC     Status: Abnormal   Collection Time: 06/18/22  7:29 PM  Result Value Ref Range   WBC 9.0 4.0 - 10.5 K/uL   RBC 4.37 3.87 - 5.11 MIL/uL   Hemoglobin 9.4 (L) 12.0 - 15.0 g/dL   HCT 33.2 (L) 36.0 - 46.0 %   MCV 76.0 (L) 80.0 - 100.0 fL   MCH 21.5 (L) 26.0 - 34.0 pg   MCHC 28.3 (L) 30.0 - 36.0 g/dL   RDW 28.2 (H) 11.5 - 15.5 %   Platelets 57 (L) 150 - 400 K/uL    Comment: SPECIMEN CHECKED FOR CLOTS Immature Platelet Fraction may be clinically indicated, consider ordering this additional test [id number] REPEATED TO VERIFY    nRBC 0.0 0.0 - 0.2 %    Comment: Performed at [facility], [place] [address], [place], [place] [address]  Magnesium     Status: None   Collection Time: 06/18/22  7:29 PM  Result Value Ref Range   Magnesium 2.0 1.7 - 2.4 mg/dL    Comment: Performed at [facility], [place] [address] [place], [place] [address]  Lactic acid, plasma     Status: Abnormal   Collection Time: 06/18/22  7:29 PM  Result Value Ref Range   Lactic Acid, Venous 7.0 (HH) 0.5 - 1.9 mmol/L    Comment: CRITICAL RESULT CALLED TO, READ BACK BY AND VERIFIED WITH GRAY S. RN @2003  ON 06/18/22 BY Benjaman Kindler C Performed at [facility], [place] [address], [place], [place] [address]   Salicylate level     Status: Abnormal   Collection Time: 06/18/22  7:29 PM  Result Value Ref Range   Salicylate Lvl <3.6 (L) 7.0 - 30.0 mg/dL    Comment: Performed at [facility], [place] [address] [place], [place] [address]  Acetaminophen level     Status: Abnormal   Collection Time: 06/18/22  7:29 PM  Result Value Ref Range   Acetaminophen (Tylenol), Serum <10 (L) 10 - 30 ug/mL    Comment: (NOTE) Therapeutic concentrations vary significantly. A range of 10-30 ug/mL  may be an effective concentration for many patients. However, some  are best treated at  concentrations outside of this range. Acetaminophen concentrations >150 ug/mL at 4 hours after ingestion  and >50 ug/mL at 12 hours after ingestion are often associated with  toxic reactions.  Performed at [facility], [place] [address] [place], [place] [address]   Ammonia     Status: Abnormal   Collection Time: 06/18/22  7:29 PM  Result Value Ref  Range   Ammonia 156 (H) 9 - 35 umol/L    Comment: Performed at [facility], [place] [address], [place], [place] [address]  CBG monitoring, ED     Status: Abnormal   Collection Time: 06/18/22  7:31 PM  Result Value Ref Range   Glucose-Capillary 138 (H) 70 - 99 mg/dL    Comment: Glucose reference range applies only to samples taken after fasting for at least 8 hours.  I-Stat beta hCG blood, ED     Status: None   Collection Time: 06/18/22  7:35 PM  Result Value Ref Range   I-stat hCG, quantitative <5.0 <5 mIU/mL   Comment 3            Comment:   GEST. AGE      CONC.  (mIU/mL)   <=1 WEEK        5 - 50     2 WEEKS       50 - 500     3 WEEKS       100 - 10,000     4 WEEKS     1,000 - 30,000        FEMALE AND NON-PREGNANT FEMALE:     LESS THAN 5 mIU/mL   Lactic acid, plasma     Status: Abnormal   Collection Time: 06/18/22  9:09 PM  Result Value Ref Range   Lactic Acid, Venous 7.4 (HH) 0.5 - 1.9 mmol/L    Comment: CRITICAL VALUE NOTED. VALUE IS CONSISTENT WITH PREVIOUSLY REPORTED/CALLED VALUE Performed at [place] [address] [place], [place] [address]   Ethanol     Status: Abnormal   Collection Time: 06/18/22 10:30 PM  Result Value Ref Range   Alcohol, Ethyl (B) 158 (H) <10 mg/dL    Comment: (NOTE) Lowest detectable limit for serum alcohol is 10 mg/dL.  For medical purposes only. Performed at [facility], [place] [address], [place], [place] [address]   Protime-INR     Status: Abnormal   Collection Time: 06/18/22 10:30 PM  Result Value Ref Range    Prothrombin Time 20.2 (H) 11.4 - 15.2 seconds   INR 1.7 (H) 0.8 - 1.2    Comment: (NOTE) INR goal varies based on device and disease states. Performed at [facility], [place] [address] [place],  [address]    CT Head Wo Contrast  Result Date: 06/18/2022 CLINICAL DATA:  Altered mental status, ETOH detox EXAM: CT HEAD WITHOUT CONTRAST TECHNIQUE: Contiguous axial images were obtained from the base of the skull through the vertex without intravenous contrast. RADIATION DOSE REDUCTION: This exam was performed according to the departmental dose-optimization program which includes automated exposure control, adjustment of the mA and/or kV according to patient size and/or use of iterative reconstruction technique. COMPARISON:  04/16/2022 FINDINGS: Brain: No evidence of acute infarction, hemorrhage, hydrocephalus, extra-axial collection or mass lesion/mass effect. Mild subcortical white matter and periventricular small vessel ischemic changes. Vascular: No hyperdense vessel or unexpected calcification. Skull: Normal. Negative for fracture or focal lesion. Sinuses/Orbits: The visualized paranasal sinuses are essentially clear. The mastoid air cells are unopacified. Other: None. IMPRESSION: No acute intracranial abnormality. Mild small vessel ischemic changes. Electronically Signed   By: Julian Hy M.D.   On: 06/18/2022 19:51    Pending Labs Unresulted Labs (From admission, onward)     Start     Ordered   06/26/22 0500  Creatinine, serum  (enoxaparin (LOVENOX)    CrCl >/=  30 ml/min)  Weekly,   R     Comments: while on enoxaparin therapy    06/19/22 0050   06/19/22 8338  Basic metabolic panel  Tomorrow morning,   R        06/19/22 0048   06/19/22 0051  CBC  (enoxaparin (LOVENOX)    CrCl >/= 30 ml/min)  Once,   R       Comments: Baseline for enoxaparin therapy IF NOT ALREADY DRAWN.  Notify MD if PLT < 100 K.    06/19/22 0050   06/19/22 0051  Creatinine, serum  (enoxaparin  (LOVENOX)    CrCl >/= 30 ml/min)  Once,   R       Comments: Baseline for enoxaparin therapy IF NOT ALREADY DRAWN.    06/19/22 0050   06/18/22 2009  Blood culture (routine x 2)  BLOOD CULTURE X 2,   R (with STAT occurrences)      06/18/22 2008   06/18/22 1914  Urinalysis, Routine w reflex microscopic Urine, Clean Catch  Once,   URGENT        06/18/22 1913   06/18/22 1909  Urine rapid drug screen (hosp performed)  Once,   STAT        06/18/22 1909            Vitals/Pain Today's Vitals   06/19/22 0015 06/19/22 0020 06/19/22 0035 06/19/22 0044  BP: (!) 126/114 (!) 126/110 136/76   Pulse: (!) 147 (!) 149 (!) 130   Resp:   (!) 25   Temp:    98.3 F (36.8 C)  TempSrc:    Oral  SpO2: 100%  100%   Weight:      Height:      PainSc:        Isolation Precautions No active isolations  Medications Medications  thiamine (VITAMIN B1) tablet 100 mg ( Oral See Alternative 06/19/22 0006)    Or  thiamine (VITAMIN B1) injection 100 mg (100 mg Intravenous Given 06/19/22 0006)  0.9 %  sodium chloride infusion (has no administration in time range)  dexmedetomidine (PRECEDEX) 200 MCG/50ML (4 mcg/mL) infusion (0.2 mcg/kg/hr  66 kg Intravenous New Bag/Given 06/19/22 0119)  LORazepam (ATIVAN) injection 0-4 mg (has no administration in time range)    Or  LORazepam (ATIVAN) tablet 0-4 mg (has no administration in time range)  lactulose (CHRONULAC) 10 GM/15ML solution 30 g (has no administration in time range)  docusate sodium (COLACE) capsule 100 mg (has no administration in time range)  polyethylene glycol (MIRALAX / GLYCOLAX) packet 17 g (has no administration in time range)  enoxaparin (LOVENOX) injection 40 mg (has no administration in time range)  ondansetron (ZOFRAN) injection 4 mg (has no administration in time range)  thiamine (VITAMIN B1) 500 mg in sodium chloride 0.9 % 50 mL IVPB (0 mg Intravenous Stopped 06/18/22 2158)  sodium chloride 0.9 % bolus 1,000 mL (1,000 mLs Intravenous New  Bag/Given 06/18/22 2010)  LORazepam (ATIVAN) injection 1 mg (1 mg Intravenous Given 06/18/22 1936)  potassium chloride 10 mEq in 100 mL IVPB (10 mEq Intravenous New Bag/Given 06/19/22 0009)  LORazepam (ATIVAN) injection 1 mg (1 mg Intravenous Given 06/18/22 2038)  lactated ringers bolus 1,000 mL (1,000 mLs Intravenous Bolus 06/19/22 0010)  LORazepam (ATIVAN) injection 2 mg (2 mg Intravenous Given 06/19/22 0007)  lactated ringers bolus 1,000 mL (1,000 mLs Intravenous Bolus 06/19/22 0103)    Mobility walks Moderate fall risk   Focused Assessments    R Recommendations: See Admitting  Provider Note  Report given to:   Additional Notes:

## 2022-06-19 NOTE — Progress Notes (Signed)
[facility] Progress Note Patient Name: Joann [name] DOB: Aug 22, 1981 MRN: [id number]   Date of Service  06/19/2022  HPI/Events of Note  Patient with agitated delirium from ETOH withdrawal and needs soft wrist restraints to keep her from pulling out he iv or climbing out of bed and falling.  eICU Interventions  Bilateral soft wrist restraints ordered.        Kerry Kass Astin Rape 06/19/2022, 5:57 AM

## 2022-06-19 NOTE — TOC Progression Note (Signed)
Transition of Care [facility]) - Progression Note    Patient Details  Name: ALESE FURNISS MRN: [id number] Date of Birth: 1981-05-31  [facility] CM/SW Contact  Purcell Mouton, RN Phone Number: 06/19/2022, 3:41 PM  Clinical Narrative:    Chart reviewed. Substance Abuse resources placed in AVS. TOC will continue to follow.        Expected Discharge Plan and Services                                                 Social Determinants of Health (SDOH) Interventions    Readmission Risk Interventions    02/20/2022    2:32 PM 02/19/2022   10:32 AM  Readmission Risk Prevention Plan  Transportation Screening    PCP or Specialist Appt within 3-5 Days    HRI or [place] Work Consult for [place] Planning/Counseling    [place] Screening    Medication Review [employer])       Information is confidential and restricted. Go to Review Flowsheets to unlock data.

## 2022-06-19 NOTE — Progress Notes (Signed)
[facility] ADULT ICU REPLACEMENT PROTOCOL   The patient does apply for the [facility] Adult ICU Electrolyte Replacment Protocol based on the criteria listed below:   1.Exclusion criteria: TCTS patients, ECMO patients, and Dialysis patients 2. Is GFR >/= 30 ml/min? Yes.    Patient's GFR today is >60 3. Is SCr </= 2? Yes.   Patient's SCr is 0.81 mg/dL 4. Did SCr increase >/= 0.5 in 24 hours? No. 5.Pt's weight >40kg  Yes.   6. Abnormal electrolyte(s): K+ 2.8  7. Electrolytes replaced per protocol 8.  Call MD STAT for K+ </= 2.5, Phos </= 1, or Mag </= 1 Physician:  Ignacia Marvel 06/19/2022 4:36 AM

## 2022-06-20 DIAGNOSIS — F19931 Other psychoactive substance use, unspecified with withdrawal delirium: Secondary | ICD-10-CM

## 2022-06-20 DIAGNOSIS — G9340 Encephalopathy, unspecified: Secondary | ICD-10-CM

## 2022-06-20 DIAGNOSIS — K7031 Alcoholic cirrhosis of liver with ascites: Secondary | ICD-10-CM

## 2022-06-20 DIAGNOSIS — K7682 Hepatic encephalopathy: Secondary | ICD-10-CM | POA: Diagnosis not present

## 2022-06-20 DIAGNOSIS — K7011 Alcoholic hepatitis with ascites: Secondary | ICD-10-CM

## 2022-06-20 DIAGNOSIS — K7010 Alcoholic hepatitis without ascites: Secondary | ICD-10-CM

## 2022-06-20 LAB — CBC
HCT: 25.6 % — ABNORMAL LOW (ref 36.0–46.0)
Hemoglobin: 7.2 g/dL — ABNORMAL LOW (ref 12.0–15.0)
MCH: 22.4 pg — ABNORMAL LOW (ref 26.0–34.0)
MCHC: 28.1 g/dL — ABNORMAL LOW (ref 30.0–36.0)
MCV: 79.8 fL — ABNORMAL LOW (ref 80.0–100.0)
Platelets: 23 10*3/uL — CL (ref 150–400)
RBC: 3.21 MIL/uL — ABNORMAL LOW (ref 3.87–5.11)
RDW: 28.3 % — ABNORMAL HIGH (ref 11.5–15.5)
WBC: 3.4 10*3/uL — ABNORMAL LOW (ref 4.0–10.5)
nRBC: 0 % (ref 0.0–0.2)

## 2022-06-20 LAB — BASIC METABOLIC PANEL
Anion gap: 4 — ABNORMAL LOW (ref 5–15)
BUN: 7 mg/dL (ref 6–20)
CO2: 20 mmol/L — ABNORMAL LOW (ref 22–32)
Calcium: 7.8 mg/dL — ABNORMAL LOW (ref 8.9–10.3)
Chloride: 117 mmol/L — ABNORMAL HIGH (ref 98–111)
Creatinine, Ser: 0.57 mg/dL (ref 0.44–1.00)
GFR, Estimated: >60 mL/min (ref >60)
Glucose, Bld: 119 mg/dL — ABNORMAL HIGH (ref 70–99)
Potassium: 3.9 mmol/L (ref 3.5–5.1)
Sodium: 141 mmol/L (ref 135–145)

## 2022-06-20 LAB — COMPREHENSIVE METABOLIC PANEL
ALT: 26 U/L (ref 0–44)
AST: 190 U/L — ABNORMAL HIGH (ref 15–41)
Albumin: 2.9 g/dL — ABNORMAL LOW (ref 3.5–5.0)
Alkaline Phosphatase: 148 U/L — ABNORMAL HIGH (ref 38–126)
Anion gap: 8 (ref 5–15)
BUN: 7 mg/dL (ref 6–20)
CO2: 20 mmol/L — ABNORMAL LOW (ref 22–32)
Calcium: 7.6 mg/dL — ABNORMAL LOW (ref 8.9–10.3)
Chloride: 115 mmol/L — ABNORMAL HIGH (ref 98–111)
Creatinine, Ser: 0.62 mg/dL (ref 0.44–1.00)
GFR, Estimated: >60 mL/min (ref >60)
Glucose, Bld: 122 mg/dL — ABNORMAL HIGH (ref 70–99)
Potassium: 3 mmol/L — ABNORMAL LOW (ref 3.5–5.1)
Sodium: 143 mmol/L (ref 135–145)
Total Bilirubin: 4 mg/dL — ABNORMAL HIGH (ref 0.3–1.2)
Total Protein: 7.7 g/dL (ref 6.5–8.1)

## 2022-06-20 LAB — LACTIC ACID, PLASMA: Lactic Acid, Venous: 1.4 mmol/L (ref 0.5–1.9)

## 2022-06-20 LAB — AMMONIA
Ammonia: 177 umol/L — ABNORMAL HIGH (ref 9–35)
Ammonia: 176 umol/L — ABNORMAL HIGH (ref 9–35)

## 2022-06-20 LAB — MAGNESIUM: Magnesium: 1.6 mg/dL — ABNORMAL LOW (ref 1.7–2.4)

## 2022-06-20 LAB — GLUCOSE, CAPILLARY: Glucose-Capillary: 134 mg/dL — ABNORMAL HIGH (ref 70–99)

## 2022-06-20 MED ORDER — THIAMINE MONONITRATE 100 MG PO TABS
100.0000 mg | ORAL_TABLET | Freq: Every day | ORAL | Status: DC
  Administered 2022-06-21 – 2022-06-26 (×4): 100 mg
  Filled 2022-06-20 (×6): qty 1

## 2022-06-20 MED ORDER — POTASSIUM CHLORIDE 20 MEQ PO PACK
20.0000 meq | PACK | ORAL | Status: AC
  Administered 2022-06-20 (×2): 20 meq
  Filled 2022-06-20 (×2): qty 1

## 2022-06-20 MED ORDER — POTASSIUM CHLORIDE 10 MEQ/100ML IV SOLN
10.0000 meq | INTRAVENOUS | Status: AC
  Administered 2022-06-20 (×4): 10 meq via INTRAVENOUS
  Filled 2022-06-20 (×3): qty 100

## 2022-06-20 MED ORDER — SODIUM CHLORIDE 0.9% FLUSH
10.0000 mL | Freq: Two times a day (BID) | INTRAVENOUS | Status: DC
  Administered 2022-06-20 – 2022-06-23 (×8): 10 mL
  Administered 2022-06-24: 20 mL
  Administered 2022-06-24 – 2022-06-25 (×2): 10 mL
  Administered 2022-06-25: 40 mL
  Administered 2022-06-26: 20 mL
  Administered 2022-06-26: 10 mL
  Administered 2022-06-27: 40 mL
  Administered 2022-06-27: 20 mL
  Administered 2022-06-28: 10 mL
  Administered 2022-06-28 – 2022-06-29 (×2): 40 mL
  Administered 2022-06-30: 20 mL
  Administered 2022-07-01 – 2022-07-03 (×4): 10 mL

## 2022-06-20 MED ORDER — THIAMINE HCL 100 MG/ML IJ SOLN
100.0000 mg | Freq: Every day | INTRAMUSCULAR | Status: DC
  Administered 2022-06-20 – 2022-06-29 (×6): 100 mg via INTRAVENOUS
  Filled 2022-06-20 (×7): qty 2

## 2022-06-20 MED ORDER — PROSOURCE TF20 ENFIT COMPATIBL EN LIQD
60.0000 mL | Freq: Every day | ENTERAL | Status: DC
  Administered 2022-06-20 – 2022-06-29 (×10): 60 mL
  Filled 2022-06-20 (×10): qty 60

## 2022-06-20 MED ORDER — OSMOLITE 1.5 CAL PO LIQD
1000.0000 mL | ORAL | Status: DC
  Administered 2022-06-20 – 2022-06-24 (×4): 1000 mL
  Filled 2022-06-20 (×15): qty 1000

## 2022-06-20 MED ORDER — SODIUM CHLORIDE 0.9% FLUSH: 10.0000 mL | INTRAVENOUS | Status: DC | PRN

## 2022-06-20 MED ORDER — PREDNISOLONE 5 MG PO TABS
40.0000 mg | ORAL_TABLET | Freq: Every day | ORAL | Status: DC
  Administered 2022-06-21 – 2022-06-24 (×4): 40 mg
  Filled 2022-06-20 (×6): qty 8

## 2022-06-20 MED ORDER — CHLORHEXIDINE GLUCONATE CLOTH 2 % EX PADS
6.0000 | MEDICATED_PAD | Freq: Every day | CUTANEOUS | Status: DC
  Administered 2022-06-20 – 2022-07-02 (×13): 6 via TOPICAL

## 2022-06-20 MED ORDER — MAGNESIUM SULFATE 4 GM/100ML IV SOLN
4.0000 g | Freq: Once | INTRAVENOUS | Status: AC
  Administered 2022-06-20: 4 g via INTRAVENOUS
  Filled 2022-06-20: qty 100

## 2022-06-20 MED ORDER — POTASSIUM CHLORIDE 20 MEQ PO PACK
40.0000 meq | PACK | Freq: Once | ORAL | Status: AC
  Administered 2022-06-20: 40 meq

## 2022-06-20 MED ORDER — PANTOPRAZOLE 2 MG/ML SUSPENSION
40.0000 mg | Freq: Every day | ORAL | Status: DC
  Administered 2022-06-20 – 2022-06-25 (×6): 40 mg
  Filled 2022-06-20 (×6): qty 20

## 2022-06-20 NOTE — Progress Notes (Addendum)
Peripherally Inserted Central Catheter Placement  The IV Nurse has discussed with the patient and/or persons authorized to consent for the patient, the purpose of this procedure and the potential benefits and risks involved with this procedure.  The benefits include less needle sticks, lab draws from the catheter, and the patient may be discharged home with the catheter. Risks include, but not limited to, infection, bleeding, blood clot (thrombus formation), and puncture of an artery; nerve damage and irregular heartbeat and possibility to perform a PICC exchange if needed/ordered by physician.  Alternatives to this procedure were also discussed.  Bard Power PICC patient education guide, fact sheet on infection prevention and patient information card has been provided to patient /or left at bedside.    Consent obtained via telephone with spouse  PICC Placement Documentation  PICC Double Lumen 06/20/22 Right Brachial 39 cm 1 cm (Active)  Indication for Insertion or Continuance of Line Vasoactive infusions 06/20/22 1400  Exposed Catheter (cm) 1 cm 06/20/22 1400  Site Assessment Clean, Dry, Intact 06/20/22 1400  Lumen #1 Status Flushed;Saline locked;Blood return noted 06/20/22 1400  Lumen #2 Status Flushed;Saline locked;Blood return noted 06/20/22 1400  Dressing Type Transparent;Securing device 06/20/22 1400  Dressing Status Antimicrobial disc in place;Clean, Dry, Intact 06/20/22 1400  Safety Lock Not Applicable 25/85/27 7824  Line Care Connections checked and tightened 06/20/22 1400  Dressing Intervention New dressing 06/20/22 1400  Dressing Change Due 06/27/22 06/20/22 1400       Holley Bouche Renee 06/20/2022, 2:30 PM

## 2022-06-20 NOTE — Progress Notes (Addendum)
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 She is non verbal on my exam but moving around and sitting up trying to get out of bed so h/o is obtained from chart review and EDP.   Per report pt had been attempting to detox at home for the past couple days, although etoh level is 158. She was unable to disclose her date of last drink but is now having confusion, weakness and fatigue. It is unclear if she has been compliant with her medications at home as well, however with her ammonia at 150's doubtful.    She is unable to provide any further ros or history.   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 Admitted on prexedex gtt and neo gtt added.  Curerntly off.  On 2LNC . Marland Kitchen Afebrile. Normal WBC. Lactate > 6 this am with very ppor clearance t hough AKI resolved. Platelets down to 28K (baseline 23K- 55k).  LFTs are c/w Acute alcholic hepatitis. Discrimiant functin score 26.7 -> Start solumedrol  Interim History / Subjective:    06/20/2022 -concerns from nursing staff yesterday about sharing medical information with  husband because of red llock in the chart.  Clarification from ICU nursing director this morning from legal team: That in the best interest of patient okay to share medical information with her husband especially in the context of critical illness and decision making  -Currently on Neo-Synephrine and Precedex and D5 LR.  Also on antibiotics and steroids.  Encephalopathy continues.  Growing staph and BC ID on Ancef added yesterday but remains afebrile.  Objective   Blood pressure 133/72, pulse 71, temperature 99 F (37.2 C), temperature source Axillary, resp. rate 18, height 5' 4"  (1.626 m), weight 70.5 kg, SpO2 99 %.        Intake/Output Summary (Last 24 hours) at 06/20/2022 0916 Last data filed at 06/20/2022 0600 Gross per 24 hour  Intake 4077.29 ml  Output 1950 ml  Net 2127.29 ml   Filed Weights   06/18/22 1834 06/19/22 0200  Weight: 66 kg 70.5 kg    General Appearance:  Looks criticall il Head:  Normocephalic, without obvious abnormality, atraumatic Eyes:  PERRL - yes, conjunctiva/corneas - jaundiced     Ears:  Normal external ear canals, both ears Nose:  G tube - YES since 06/19/22 Throat:  ETT TUBE - no , OG tube - no Neck:  Supple,  No enlargement/tenderness/nodules Lungs: Clear to auscultation bilaterally Heart:  S1 and S2 normal, no murmur, CVP - no.  Pressors - yes with precedex Abdomen:  Soft, no masses, no organomegaly Genitalia /  Rectal:  Not done Extremities:  Extremities- intact Skin:  ntact in exposed areas . Sacral area - not examined Neurologic:  Sedation - precedex gtt with ativan prn -> RASS - -3 . Moves all 4s - yes. CAM-ICU - positive . Orientation - not  Resolved Hospital Problem list     Assessment & Plan:   AT risk for intubation due to acute agitated enceophalopathy (CXR clear 06/19/22)  06/20/2022 -> currently protecting airway  P:   Intubate if worse O2 for pulse ox goal > 92%    Acute agitated encephalopathy due to DTs/ETOH withdrawal -  Present on Admit  10/11 -fluctating and on precedex gtt and ongoing. Lactulose and xifaxan started 06/19/22 via panda. Ammonia up at 177  P:   Precredex gtt to continue Ativan CIWA protocol Check ammonia Lactulseo/Xifaxan since 06/19/22 Restrains for safety to continue on 06/20/22 RASS goal o to -2   Circulatory shock at admit due to precedex  10/11 - currently on neo 61mg  P:  MAP goal > 65 Maintenance fluids   No prior hx of cardiac diease but at risk for etoh cardiomyopathy - echo normal 06/19/22  P: Monitor  CARDIAC ELECTRICAL A: Sinus tachy   P: Tele     Staph BCID -Present on Admit  06/20/22- afebrile. Normal PCT. Unclear if contaminant  P:   Ancef 06/19/22 >> DC on course   AKI at admit   10/10 -resolved  P:  monitor   Sevre lactic acidosis at admit -   10/11 - resolved. Normmal CK  P: Monitor     Alcoholic cirrhosis NOS - Prior to & Present on Admit Acute alcoholic intoxication- Present on Admit Acute alcoholic hepatitis - Present on Admit (disc score 26.7); normal tylenol land sal evel at admit. STArted solumedrol 06/20/22  06/20/22 - Bili worsening despite Solumedrol x 24h. RUQ with cirrhosis - no comment on asciets  P:   Continue steroids D2 but chagne to prednisolone Now has doboff - track Lille score on day 7 at 06/26/22 Lactulose continue Continue xifaxan Thiamine/Folic    Chronic anemia - hgb 7.5 - severe - Prior to & Present on Admit  10/11 - no active bleed. Hgb 7.2  P:  - PRBC for hgb </= 6.9gm%    - exceptions are   -  if ACS susepcted/confirmed then transfuse for hgb </= 8.0gm%,  or    -  active bleeding with hemodynamic instability, then transfuse regardless of hemoglobin value   At at all times try to transfuse 1 unit prbc as possible with exception of active hemorrhage    Moderate - severe thrombocytopenia at baseline (baseline 23-76) - likey due to cirrhos s- Prior to & Present on Admit   10/11-  plat 23K and holding. No acive bleed  P Monitor No heparin/lovenoix/AC   At risk for hypoglycemia   P:   monitor  MSK/DERM Baseline moderate to severe PCM - [place] - start TF 06/20/22  Best Practice (right click and "Reselect all SmartList Selections" daily)  PICC line - ordered 06/19/22 Diet/type: Start TF 06/20/22 DVT prophylaxis: SCD GI prophylaxis: PPI Lines: N/A Foley:  N/A Code Status:  full code  Last date of multidisciplinary goals of care discussion    -Andreina Outten [phone] He says he called the ER 2h ago 06/19/2022 and was informed "patient is not here". He is upset that ER did not inform him about whereabouts. He says that  no one called him yesterday but for admissions nurse last night. He says he was not aware patient is in ICU.  He is upset about this.  He then wanted me to call her Derek Jack mother [phone]-> no answer   - called husband 06/20/22 - kept ringing and LMTCB -> 10:02 AM he called back and gave him update. Explained about combo of cirrhosis and acute alcoholic hepatitis and encephalopathy. Explained that if liver continues to fail, mortality is to be expected.   -> he asked if I did put her on "red lock" -> I told him I did NOT put the red lock. He did say that nursing director Nelva Bush did call  him and explained that we will share medical infor with him. He said he is very upset and he is the voice for her. He said if she said it in the ER and they put red lock they should not have because "she had delirium". He was on the road as truck driver and had EMS called  -> later daughter Lynett Fish (aged 46) came to hospital but was turned awa. She called the doc line and told her we are trying to sort out redlock situation and will let her know -> [phone]      ATTESTATION & SIGNATURE   The patient SHUNTIA EXTON is critically ill with multiple organ systems failure and requires high complexity decision  making for assessment and support, frequent evaluation and titration of therapies, application of advanced monitoring technologies and extensive interpretation of multiple databases.   Critical Care Time devoted to patient care services described in this note is  60  Minutes. This time reflects time of care of this signee Dr Brand Males. This critical care time does not reflect procedure time, or teaching time or supervisory time of PA/NP/Med student/Med Resident etc but could involve care discussion time     Dr. Brand Males, M.D., [facility].C.P Pulmonary and Critical Care Medicine Medical Director - [facility] ICU Staff Physician, [facility] Pager: [phone], If no answer or between  15:00h - 7:00h: call [phone]  06/20/2022 9:57 AM    LABS      PULMONARY No results for input(s): "PHART", "PCO2ART", "PO2ART", "HCO3", "TCO2", "O2SAT" in the last 168 hours.  Invalid input(s): "PCO2", "PO2"  CBC Recent Labs  Lab 06/19/22 1257 06/19/22 2123 06/20/22 0307  HGB 7.0* 7.1* 7.2*  HCT 26.0* 26.2* 25.6*  WBC 3.4* 2.4* 3.4*  PLT 21* 21* 23*    COAGULATION Recent Labs  Lab 06/18/22 2230  INR 1.7*    CARDIAC  No results for input(s): "TROPONINI" in the last 168 hours. No results for input(s): "PROBNP" in the last 168 hours.   CHEMISTRY Recent Labs  Lab 06/18/22 1929 06/19/22 0258 06/19/22 1257 06/20/22 0307  NA 139 142 143 143  K 2.5* 2.8* 3.6 3.0*  CL 98 107 112* 115*  CO2 19* 18* 22 20*  GLUCOSE 131* 103* 91 122*  BUN 13 10 9 7   CREATININE 1.41* 0.81 0.69 0.62  CALCIUM 8.3* 7.3* 7.2* 7.6*  MG 2.0  --   --   --    Estimated Creatinine Clearance: 87.3 mL/min (by C-G formula based on SCr of 0.62 mg/dL).   LIVER Recent Labs  Lab 06/18/22 1929 06/18/22 2230 06/19/22 1257 06/20/22 0307  AST 275*  --  198* 190*  ALT 36  --  25 26  ALKPHOS 195*  --  143* 148*  BILITOT 3.7*  --  3.9* 4.0*   PROT 10.5*  --  7.4 7.7  ALBUMIN 4.0  --  2.9* 2.9*  INR  --  1.7*  --   --      INFECTIOUS Recent Labs  Lab 06/19/22 0553 06/19/22 1230 06/19/22 1257 06/20/22 0307  LATICACIDVEN 6.2* 2.1*  --  1.4  PROCALCITON  --   --  <0.10  --      ENDOCRINE CBG (last 3)  Recent Labs    06/19/22 0550 06/19/22 1132 06/19/22 1545  GLUCAP 88 76 93         IMAGING x48h  - image(s) personally visualized  -   highlighted in bold [place] EKG SITE RITE  Result Date: 06/19/2022 If Site Rite image not attached, placement could not be confirmed due to current cardiac rhythm.  US Abdomen Limited RUQ (LIVER/GB)  Result Date: 06/19/2022 CLINICAL DATA:  Cirrhosis EXAM: ULTRASOUND ABDOMEN LIMITED RIGHT UPPER QUADRANT COMPARISON:  CT abdomen pelvis 02/24/2022 FINDINGS: Gallbladder: Surgically absent Common bile duct: Diameter: 2.7 mm Liver: Increased echogenicity liver diffusely. Nodular liver contour compatible with cirrhosis. No liver mass. Suboptimal evaluation of portal vein. Patient not able to hold still. Other: None. IMPRESSION: Postop cholecystectomy.  No biliary dilatation Cirrhosis liver Electronically Signed   By: Franchot Gallo M.D.   On: 06/19/2022 15:46   ECHOCARDIOGRAM COMPLETE  Result Date: 06/19/2022    ECHOCARDIOGRAM REPORT   Patient Name:   GENI SKORUPSKI Date of Exam: 06/19/2022 Medical Rec #:  [id number]   Height:       64.0 in Accession #:    [id number]  Weight:       155.4 lb Date of Birth:  1981-08-29   BSA:          1.758 m Patient Age:    72 years    BP:           103/53 mmHg Patient Gender: F           HR:           73 bpm. Exam Location:  Inpatient Procedure: 2D Echo Indications:    Cirrhosis  History:        Patient has no prior history of Echocardiogram examinations.  Sonographer:    Harvie Junior Referring Phys: [place]  1. Left ventricular ejection fraction, by estimation, is 60 to 65%. The left ventricle has normal function. The left ventricle has  no regional wall motion abnormalities. Left ventricular diastolic parameters were normal.  2. Right ventricular systolic function is normal. The right ventricular size is normal. There is normal pulmonary artery systolic pressure.  3. The mitral valve is normal in structure. No evidence of mitral valve regurgitation. No evidence of mitral stenosis.  4. The aortic valve is tricuspid. Aortic valve regurgitation is not visualized. No aortic stenosis is present. Comparison(s): No prior Echocardiogram. Conclusion(s)/Recommendation(s): Normal biventricular function without evidence of hemodynamically significant valvular heart disease. FINDINGS  Left Ventricle: Left ventricular ejection fraction, by estimation, is 60 to 65%. The left ventricle has normal function. The left ventricle has no regional wall motion abnormalities. The left ventricular internal cavity size was normal in size. There is  no left ventricular hypertrophy. Left ventricular diastolic parameters were normal. Right Ventricle: The right ventricular size is normal. No increase in right ventricular wall thickness. Right ventricular systolic function is normal. There is normal pulmonary artery systolic pressure. The  tricuspid regurgitant velocity is 1.75 m/s, and  with an assumed right atrial pressure of 3 mmHg, the estimated right ventricular systolic pressure is 36.6 mmHg. Left Atrium: Left atrial size was normal in size. Right Atrium: Right atrial size was normal in size. Pericardium: There is no evidence of pericardial effusion. Mitral Valve: The mitral valve is normal in structure. No evidence of mitral valve regurgitation. No evidence of mitral valve stenosis. Tricuspid Valve: The tricuspid valve is normal in structure. Tricuspid valve regurgitation is not demonstrated. No evidence of tricuspid stenosis. Aortic Valve: The aortic valve is tricuspid. Aortic valve regurgitation is not visualized. No aortic stenosis is present. Aortic valve mean gradient  measures 5.0 mmHg. Aortic valve peak gradient measures 9.9 mmHg. Aortic valve area, by VTI measures 2.77 cm. Pulmonic Valve: The pulmonic valve was normal in structure. Pulmonic valve regurgitation is not visualized. No evidence of pulmonic stenosis. Aorta: The aortic root is normal in size and structure. IAS/Shunts: No atrial level shunt detected by color flow Doppler.  LEFT VENTRICLE PLAX 2D LVIDd:         5.00 cm      Diastology LVIDs:         3.20 cm      LV e' medial:    10.70 cm/s LV PW:         0.70 cm      LV E/e' medial:  8.1 LV IVS:        0.70 cm      LV e' lateral:   13.10 cm/s LVOT diam:     2.10 cm      LV E/e' lateral: 6.6 LV SV:         86 LV SV Index:   49 LVOT Area:     3.46 cm  LV Volumes (MOD) LV vol d, MOD A2C: 111.0 ml LV vol d, MOD A4C: 121.0 ml LV vol s, MOD A2C: 45.0 ml LV vol s, MOD A4C: 39.7 ml LV SV MOD A2C:     66.0 ml LV SV MOD A4C:     121.0 ml LV SV MOD BP:      74.9 ml RIGHT VENTRICLE RV Basal diam:  3.10 cm RV Mid diam:    2.50 cm RV S prime:     14.50 cm/s TAPSE (M-mode): 2.7 cm LEFT ATRIUM             Index        RIGHT ATRIUM           Index LA diam:        3.40 cm 1.93 cm/m   RA Area:     10.90 cm LA Vol (A2C):   43.7 ml 24.86 ml/m  RA Volume:   24.50 ml  13.94 ml/m LA Vol (A4C):   40.4 ml 22.99 ml/m LA Biplane Vol: 44.8 ml 25.49 ml/m  AORTIC VALVE                     PULMONIC VALVE AV Area (Vmax):    2.78 cm      PV Vmax:       1.14 m/s AV Area (Vmean):   2.67 cm      PV Peak grad:  5.2 mmHg AV Area (VTI):     2.77 cm AV Vmax:           157.00 cm/s AV Vmean:          105.000 cm/s AV VTI:  0.311 m AV Peak Grad:      9.9 mmHg AV Mean Grad:      5.0 mmHg LVOT Vmax:         126.00 cm/s LVOT Vmean:        81.000 cm/s LVOT VTI:          0.249 m LVOT/AV VTI ratio: 0.80  AORTA Ao Root diam: 3.40 cm MITRAL VALVE               TRICUSPID VALVE MV Area (PHT): 3.99 cm    TR Peak grad:   12.2 mmHg MV Decel Time: 190 msec    TR Vmax:        175.00 cm/s MR Peak grad: 20.8  mmHg MR Vmax:      228.00 cm/s  SHUNTS MV E velocity: 87.00 cm/s  Systemic VTI:  0.25 m MV A velocity: 52.50 cm/s  Systemic Diam: 2.10 cm MV E/A ratio:  1.66 Rudean Haskell MD Electronically signed by Rudean Haskell MD Signature Date/Time: 06/19/2022/3:39:11 PM    Final    DG Abd 1 View  Result Date: 06/19/2022 CLINICAL DATA:  Feeding tube placement EXAM: ABDOMEN - 1 VIEW COMPARISON:  04/16/2022 FINDINGS: A weighted tip feeding tube has been advanced, tip near the pylorus. The stomach is decompressed. Visualized bowel gas pattern unremarkable; lower abdomen excluded. Cholecystectomy clips noted. IMPRESSION: Feeding tube tip near the pylorus. Electronically Signed   By: Lucrezia Europe M.D.   On: 06/19/2022 14:34   DG CHEST PORT 1 VIEW  Result Date: 06/19/2022 CLINICAL DATA:  Chronic alcohol abuse presenting with alcohol withdrawal. EXAM: PORTABLE CHEST 1 VIEW COMPARISON:  Chest x-ray February 23, 2022 FINDINGS: The cardiomediastinal silhouette is unchanged in contour. Low lung volumes with bronchovascular crowding. No focal pulmonary opacity. No pleural effusion or pneumothorax. Visualized upper abdomen is unremarkable. No acute osseous abnormality. IMPRESSION: No acute cardiopulmonary abnormality. Electronically Signed   By: Beryle Flock M.D.   On: 06/19/2022 11:59   CT Head Wo Contrast  Result Date: 06/18/2022 CLINICAL DATA:  Altered mental status, ETOH detox EXAM: CT HEAD WITHOUT CONTRAST TECHNIQUE: Contiguous axial images were obtained from the base of the skull through the vertex without intravenous contrast. RADIATION DOSE REDUCTION: This exam was performed according to the departmental dose-optimization program which includes automated exposure control, adjustment of the mA and/or kV according to patient size and/or use of iterative reconstruction technique. COMPARISON:  04/16/2022 FINDINGS: Brain: No evidence of acute infarction, hemorrhage, hydrocephalus, extra-axial collection or mass  lesion/mass effect. Mild subcortical white matter and periventricular small vessel ischemic changes. Vascular: No hyperdense vessel or unexpected calcification. Skull: Normal. Negative for fracture or focal lesion. Sinuses/Orbits: The visualized paranasal sinuses are essentially clear. The mastoid air cells are unopacified. Other: None. IMPRESSION: No acute intracranial abnormality. Mild small vessel ischemic changes. Electronically Signed   By: Julian Hy M.D.   On: 06/18/2022 19:51

## 2022-06-20 NOTE — Progress Notes (Signed)
RN, Department Director and charge nurse spoke with compliance department regarding patient care information, decisions and family.  RN and charge RN advised since patient is unable to advocate and make medical decisions in current state staff would be able to update pt husband Celesta Gentile as well as allow husband, daughter and mother to visit.

## 2022-06-20 NOTE — Consult Note (Signed)
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 MELD NA 18 Cirrhosis presumably Etoh related though I don't see that complete serologic workup done to exclude other causes. Can do this outpatient. Chronic viral HBV, HCV studies negative.In the office we will need to check immunity to HAV, HBV [place] screening: no focal liver lesions reported on [place] yesterday or on CTAP w contrast in June 2023 Ascites: No history of ascites. Doesn't appear to have ascites on exam today. [place] yesterday was only of RUQ Encephalopathy: getting xifaxan and lactulose. Currently sedated on precedex. Esophageal varices surveillance. Small varices on EGD in April 2021. Needs follow up EGD at some point.  LFTs are overall stable, Tbili slowly rising but not unexpected with Etoh hepatitis. INR slightly up overnight from 1.5 to 1.7. Cannot assess mental status, on precedex. PCCM has started steroids. Got IV steroids today, to start prednisolone tomorrow. Her blood culture are positive for Staph Aureus but afebrile with normal white count. On Ancef. Will calculate L[place] score in 5-7 days.  # Severe thrombocytopenia related to cirrhosis  and Etoh .   # See PMH for additional medical problems   HPI   Joann [name] is a 41 y.o. female with a past medical history significant for  alcohol use disorder, Etoh cirrhosis with portal HTN ( hepatic encephalopathy, esophageal varices), Etoh pancreatitis, IDA, cholecystectomy. See PMH for any additional medical problems.  Followed by Dr. Rush Landmark for history of  cirrhosis. Followed at some point by [facility]. Has had previous admissions for hepatic encephalopathy, Etoh withdrawal. . Last seen in our office in 02/17/22, please refer to that note for details. Based on phone calls subsequent to that visit she was trying to get into a treatment center.. Sounds like she was denied. She was readmited to hospital in early Aug with N/V/abdominal pain/ dark stools. She had been binge drinking.  We saw her 04/17/22 and she denied recent hematemesis. Heme negative. She didn't want EGD, was discharged home. .    10/9 ED  - Patient presented to ED for evaluation of weakness, confusion, vomiting while trying to stop drinking etoh. Ammonia in 150's, Etoh level ^158. AST 275, ALT 36, Tbili 3.7. WBC 9, hgb 9.4, platelets 57  No acute findings on head CT scan.  Admitted by PCCM for Etoh w/d.  Started on precedex which was later stopped d/t hypotension.   [place] RUQ - cirrhosis.   Unable to obtain history. She is sedated on precedex.  Today's labs / events Ammonia 177 Hgb stable at 7.2, platelets 23.  INR slightly worse 1.5 >> 1.7 Liver chemistries stable today,  T.bili 3.7 >> 4, ALT normal, AST stable at 190.  Started Xifaxan and lactulose yesterday, staring prednisolone today   Previous GI Evaluation     April 2021 EGD for IDA and hematemesis -Small (< 5 mm) esophageal varices. - A large amount of food (residue) in  the stomach. - Normal examined duodenum. - No specimens collected  Recent Labs and Imaging [place] EKG SITE RITE  Result Date: 06/19/2022 If Site Rite image not attached, placement could not be confirmed due to current cardiac rhythm.  US Abdomen Limited RUQ (LIVER/GB)  Result Date: 06/19/2022 CLINICAL DATA:  Cirrhosis EXAM: ULTRASOUND ABDOMEN LIMITED RIGHT UPPER QUADRANT COMPARISON:  CT abdomen pelvis 02/24/2022 FINDINGS: Gallbladder: Surgically absent Common bile duct: Diameter: 2.7 mm Liver: Increased echogenicity liver diffusely. Nodular liver contour  compatible with cirrhosis. No liver mass. Suboptimal evaluation of portal vein. Patient not able to hold still. Other: None. IMPRESSION: Postop cholecystectomy.  No biliary dilatation Cirrhosis liver Electronically Signed   By: Franchot Gallo M.D.   On: 06/19/2022 15:46   ECHOCARDIOGRAM COMPLETE  Result Date: 06/19/2022    ECHOCARDIOGRAM REPORT   Patient Name:   Joann [name] Date of Exam: 06/19/2022 Medical Rec #:  [id number]   Height:       64.0 in Accession #:    [id number]  Weight:       155.4 lb Date of Birth:  12-22-1980   BSA:          1.758 m Patient Age:    57 years    BP:           103/53 mmHg Patient Gender: F           HR:           73 bpm. Exam Location:  Inpatient Procedure: 2D Echo Indications:    Cirrhosis  History:        Patient has no prior history of Echocardiogram examinations.  Sonographer:    Harvie Junior Referring Phys: [place]  1. Left ventricular ejection fraction, by estimation, is 60 to 65%. The left ventricle has normal function. The left ventricle has no regional wall motion abnormalities. Left ventricular diastolic parameters were normal.  2. Right ventricular systolic function is normal. The right ventricular size is normal. There is normal pulmonary artery systolic pressure.  3. The mitral valve is normal in structure. No evidence of mitral valve regurgitation. No evidence of mitral stenosis.  4. The aortic valve is tricuspid. Aortic valve regurgitation is not visualized. No aortic stenosis is present. Comparison(s): No prior Echocardiogram. Conclusion(s)/Recommendation(s): Normal biventricular function without evidence of hemodynamically significant valvular heart disease. FINDINGS  Left Ventricle: Left ventricular ejection fraction, by estimation, is 60 to 65%. The left ventricle has normal function. The left ventricle has no regional wall motion abnormalities. The left ventricular internal cavity size was normal in size. There is  no left ventricular  hypertrophy. Left ventricular diastolic parameters were normal. Right Ventricle: The right ventricular size is normal. No increase in right ventricular wall thickness. Right ventricular systolic function is normal. There is normal pulmonary artery systolic pressure. The tricuspid regurgitant velocity is 1.75 m/s, and  with an assumed right atrial pressure of 3 mmHg, the estimated right ventricular systolic pressure is 59.5 mmHg. Left [facility]: Left atrial size was normal in size. Right [facility]: Right atrial size was normal in size. Pericardium: There is no evidence of pericardial effusion. Mitral Valve: The mitral valve is normal in structure. No evidence of mitral valve regurgitation. No evidence of mitral valve stenosis. Tricuspid Valve: The tricuspid valve is normal in structure. Tricuspid valve regurgitation is not demonstrated. No evidence of tricuspid stenosis. Aortic Valve: The aortic valve is tricuspid. Aortic valve regurgitation is not visualized. No aortic stenosis is present. Aortic valve mean gradient  measures 5.0 mmHg. Aortic valve peak gradient measures 9.9 mmHg. Aortic valve area, by VTI measures 2.77 cm. Pulmonic Valve: The pulmonic valve was normal in structure. Pulmonic valve regurgitation is not visualized. No evidence of pulmonic stenosis. Aorta: The aortic root is normal in size and structure. IAS/Shunts: No atrial level shunt detected by color flow Doppler.  LEFT VENTRICLE PLAX 2D LVIDd:         5.00 cm      Diastology LVIDs:         3.20 cm      LV e' medial:    10.70 cm/s LV PW:         0.70 cm      LV E/e' medial:  8.1 LV IVS:        0.70 cm      LV e' lateral:   13.10 cm/s LVOT diam:     2.10 cm      LV E/e' lateral: 6.6 LV SV:         86 LV SV Index:   49 LVOT Area:     3.46 cm  LV Volumes (MOD) LV vol d, MOD A2C: 111.0 ml LV vol d, MOD A4C: 121.0 ml LV vol s, MOD A2C: 45.0 ml LV vol s, MOD A4C: 39.7 ml LV SV MOD A2C:     66.0 ml LV SV MOD A4C:     121.0 ml LV SV MOD BP:      74.9 ml RIGHT  VENTRICLE RV Basal diam:  3.10 cm RV Mid diam:    2.50 cm RV S prime:     14.50 cm/s TAPSE (M-mode): 2.7 cm LEFT ATRIUM             Index        RIGHT ATRIUM           Index LA diam:        3.40 cm 1.93 cm/m   RA Area:     10.90 cm LA Vol (A2C):   43.7 ml 24.86 ml/m  RA Volume:   24.50 ml  13.94 ml/m LA Vol (A4C):   40.4 ml 22.99 ml/m LA Biplane Vol: 44.8 ml 25.49 ml/m  AORTIC VALVE                     PULMONIC VALVE AV Area (Vmax):    2.78 cm      PV Vmax:       1.14 m/s AV Area (Vmean):   2.67 cm      PV Peak grad:  5.2 mmHg AV Area (VTI):     2.77 cm AV Vmax:           157.00 cm/s AV Vmean:          105.000 cm/s AV VTI:            0.311 m AV Peak Grad:      9.9 mmHg AV Mean Grad:      5.0 mmHg LVOT Vmax:         126.00 cm/s LVOT Vmean:        81.000 cm/s LVOT VTI:          0.249 m LVOT/AV VTI ratio: 0.80  AORTA Ao Root diam: 3.40 cm MITRAL VALVE               TRICUSPID VALVE MV Area (PHT): 3.99 cm    TR Peak grad:   12.2 mmHg MV Decel Time: 190 msec  TR Vmax:        175.00 cm/s MR Peak grad: 20.8 mmHg MR Vmax:      228.00 cm/s  SHUNTS MV E velocity: 87.00 cm/s  Systemic VTI:  0.25 m MV A velocity: 52.50 cm/s  Systemic Diam: 2.10 cm MV E/A ratio:  1.66 Rudean Haskell MD Electronically signed by Rudean Haskell MD Signature Date/Time: 06/19/2022/3:39:11 PM    Final    DG Abd 1 View  Result Date: 06/19/2022 CLINICAL DATA:  Feeding tube placement EXAM: ABDOMEN - 1 VIEW COMPARISON:  04/16/2022 FINDINGS: A weighted tip feeding tube has been advanced, tip near the pylorus. The stomach is decompressed. Visualized bowel gas pattern unremarkable; lower abdomen excluded. Cholecystectomy clips noted. IMPRESSION: Feeding tube tip near the pylorus. Electronically Signed   By: Lucrezia Europe M.D.   On: 06/19/2022 14:34   DG CHEST PORT 1 VIEW  Result Date: 06/19/2022 CLINICAL DATA:  Chronic alcohol abuse presenting with alcohol withdrawal. EXAM: PORTABLE CHEST 1 VIEW COMPARISON:  Chest x-ray February 23, 2022 FINDINGS: The cardiomediastinal silhouette is unchanged in contour. Low lung volumes with bronchovascular crowding. No focal pulmonary opacity. No pleural effusion or pneumothorax. Visualized upper abdomen is unremarkable. No acute osseous abnormality. IMPRESSION: No acute cardiopulmonary abnormality. Electronically Signed   By: Beryle Flock M.D.   On: 06/19/2022 11:59   CT Head Wo Contrast  Result Date: 06/18/2022 CLINICAL DATA:  Altered mental status, ETOH detox EXAM: CT HEAD WITHOUT CONTRAST TECHNIQUE: Contiguous axial images were obtained from the base of the skull through the vertex without intravenous contrast. RADIATION DOSE REDUCTION: This exam was performed according to the departmental dose-optimization program which includes automated exposure control, adjustment of the mA and/or kV according to patient size and/or use of iterative reconstruction technique. COMPARISON:  04/16/2022 FINDINGS: Brain: No evidence of acute infarction, hemorrhage, hydrocephalus, extra-axial collection or mass lesion/mass effect. Mild subcortical white matter and periventricular small vessel ischemic changes. Vascular: No hyperdense vessel or unexpected calcification. Skull: Normal. Negative for fracture or focal lesion. Sinuses/Orbits: The visualized paranasal sinuses are essentially clear. The mastoid air cells are unopacified. Other: None. IMPRESSION: No acute intracranial abnormality. Mild small vessel ischemic changes. Electronically Signed   By: Julian Hy M.D.   On: 06/18/2022 19:51    Labs:  Recent Labs    06/19/22 1257 06/19/22 2123 06/20/22 0307  WBC 3.4* 2.4* 3.4*  HGB 7.0* 7.1* 7.2*  HCT 26.0* 26.2* 25.6*  PLT 21* 21* 23*   Recent Labs    06/19/22 0258 06/19/22 1257 06/20/22 0307  NA 142 143 143  K 2.8* 3.6 3.0*  CL 107 112* 115*  CO2 18* 22 20*  GLUCOSE 103* 91 122*  BUN 10 9 7   CREATININE 0.81 0.69 0.62  CALCIUM 7.3* 7.2* 7.6*   Recent Labs    06/20/22 0307  PROT  7.7  ALBUMIN 2.9*  AST 190*  ALT 26  ALKPHOS 148*  BILITOT 4.0*   No results for input(s): "HEPBSAG", "HCVAB", "HEPAIGM", "HEPBIGM" in the last 72 hours. Recent Labs    06/18/22 2230  LABPROT 20.2*  INR 1.7*    Past Medical History:  Diagnosis Date   [redacted] weeks gestation of pregnancy 03/22/2020   Alcohol abuse    Alcohol withdrawal delirium ([place]) 02/15/2022   Alcohol-induced acute pancreatitis    Alcoholic ketoacidosis 62/11/5595   Anxiety    DTs (delirium tremens) ([place])    Elevated LFTs 10/24/2018   Encephalopathy acute    Hepatic encephalopathy ([place]) 12/24/2019  Hypertension    Hypokalemia 04/02/2019   Hypotension 02/24/2022   Liver disease     Past Surgical History:  Procedure Laterality Date   ECTOPIC PREGNANCY SURGERY     ESOPHAGOGASTRODUODENOSCOPY (EGD) WITH PROPOFOL N/A 12/27/2019   Procedure: ESOPHAGOGASTRODUODENOSCOPY (EGD) WITH PROPOFOL;  Surgeon: Carol Ada, MD;  Location: [facility];  Service: Endoscopy;  Laterality: N/A;    Family History  Problem Relation Age of Onset   Healthy Mother    Alcohol abuse Mother        quit in her 56s   Alcohol abuse Father    Diabetes Father    Healthy Brother    Hypertension Maternal Grandmother    Hypertension Maternal Grandfather    Healthy Brother    Healthy Brother     Prior to Admission medications   Medication Sig Start Date End Date Taking? Authorizing Provider  ferrous sulfate 325 (65 FE) MG tablet Take 1 tablet (325 mg total) by mouth daily with breakfast. 02/26/22 06/18/22  Donne Hazel, MD  folic acid (FOLVITE) 1 MG tablet Take 1 tablet (1 mg total) by mouth daily. 12/22/21   France Ravens, MD  lactulose [facility]) 10 GM/15ML solution Take 20 g by mouth daily as needed for mild constipation.    [provider]  Multiple Vitamin (MULTIVITAMIN WITH MINERALS) TABS tablet Take 1 tablet by mouth daily. 01/15/20   Angiulli, Lavon Paganini, PA-C  pantoprazole (PROTONIX) 40 MG tablet Take 1 tablet (40 mg total) by  mouth 2 (two) times daily. 12/22/21   Erskine Emery, MD  phytonadione (VITAMIN K) 5 MG tablet Take 2 tablets (10 mg total) by mouth daily. 02/26/22   Mansouraty, Telford Nab., MD  rifaximin (XIFAXAN) 550 MG TABS tablet Take 1 tablet (550 mg total) by mouth 2 (two) times daily. 12/22/21   Erskine Emery, MD  traZODone (DESYREL) 150 MG tablet Take 150 mg by mouth at bedtime. 01/11/22   [provider]  zinc sulfate 220 (50 Zn) MG capsule Take 1 capsule (220 mg total) by mouth daily. 01/15/20   Angiulli, Lavon Paganini, PA-C    Current Facility-Administered Medications  Medication Dose Route Frequency Provider Last Rate Last Admin   0.9 %  sodium chloride infusion   Intravenous PRN Audria Nine, DO 10 mL/hr at 06/20/22 0800 Infusion Verify at 06/20/22 0800   0.9 %  sodium chloride infusion  250 mL Intravenous Continuous Ogan, [place]or[place]wo U, MD       ceFAZolin (ANCEF) IVPB 2g/100 mL premix  2 g Intravenous Q8H Ramaswamy, Belva Crome, MD 200 mL/hr at 06/20/22 0945 2 g at 06/20/22 0945   Chlorhexidine Gluconate Cloth 2 % PADS 6 each  6 each Topical Daily Audria Nine, DO   6 each at 06/19/22 2200   dexmedetomidine (PRECEDEX) 200 MCG/50ML (4 mcg/mL) infusion  0.2-1.8 mcg/kg/hr Intravenous Continuous Audria Nine, DO 3.53 mL/hr at 06/20/22 0800 0.2 mcg/kg/hr at 06/20/22 0800   dextrose 5 % in lactated ringers infusion   Intravenous Continuous Brand Males, MD   Paused at 06/20/22 0743   docusate (COLACE) 50 MG/5ML liquid 100 mg  100 mg Oral BID PRN Brand Males, MD       feeding supplement (OSMOLITE 1.5 CAL) liquid 1,000 mL  1,000 mL Per Tube Continuous Brand Males, MD       feeding supplement (PROSource TF20) liquid 60 mL  60 mL Per Tube Daily Brand Males, MD       folic acid (FOLVITE) tablet 1 mg  1 mg Per Tube  Daily Brand Males, MD   1 mg at 06/20/22 1035   lactulose ([place]) 10 GM/15ML solution 30 g  30 g Per Tube TID Brand Males, MD   30 g at 06/20/22 0929    lip balm (CARMEX) ointment   Topical PRN Audria Nine, DO   Given at 06/19/22 1657   LORazepam (ATIVAN) injection 0-4 mg  0-4 mg Intravenous Q2H PRN Brand Males, MD       Or   LORazepam (ATIVAN) tablet 0-4 mg  0-4 mg Per Tube Q2H PRN Brand Males, MD       ondansetron (ZOFRAN) injection 4 mg  4 mg Intravenous Q6H PRN Audria Nine, DO       Oral care mouth rinse  15 mL Mouth Rinse PRN Audria Nine, DO       pantoprazole (PROTONIX) 2 mg/mL oral suspension 40 mg  40 mg Per Tube Daily Eudelia Bunch, RPH   40 mg at 06/20/22 7096   phenylephrine (NEO-SYNEPHRINE) 67m/NS 2514mpremix infusion  25-200 mcg/min Intravenous Titrated OgTrevor Mace, MD 18.75 mL/hr at 06/20/22 0800 25 mcg/min at 06/20/22 0800   polyethylene glycol (MIRALAX / GLYCOLAX) packet 17 g  17 g Per Tube Daily PRN RaBrand MalesMD       prednisoLONE tablet 40 mg  40 mg Per Tube Daily RaBrand MalesMD       rifaximin (XDoreene Nesttablet 550 mg  550 mg Per Tube BID RaBrand MalesMD   550 mg at 06/19/22 2204   thiamine (VITAMIN B1) tablet 100 mg  100 mg Per Tube Daily BeLeodis Sias, RPH       Or   thiamine (VITAMIN B1) injection 100 mg  100 mg Intravenous Daily BeLeodis Sias, RPH   100 mg at 06/20/22 1036    Allergies as of 06/18/2022 - Review Complete 06/18/2022  Allergen Reaction Noted   Latex Hives 01/08/2020    Social History   Socioeconomic History   Marital status: Married    Spouse name: Not on file   Number of children: Not on file   Years of education: Not on file   Highest education level: Not on file  Occupational History   Occupation: nurse  Tobacco Use   Smoking status: Never   Smokeless tobacco: Never  Vaping Use   Vaping Use: Never used  Substance and Sexual Activity   Alcohol use: Yes    Comment: "I just do it"   Drug use: Never   Sexual activity: Yes  Other Topics Concern   Not on file  Social History Narrative   Not on file   Social Determinants  of Health   Financial Resource Strain: Not on file  Food Insecurity: Not on file  Transportation Needs: Not on file  Physical Activity: Not on file  Stress: Not on file  Social Connections: Not on file  Intimate Partner Violence: Not on file    Review of Systems: Unable to obtain.   Physical Exam: Vital signs in last 24 hours: Temp:  [98 F (36.7 C)-99 F (37.2 C)] 99 F (37.2 C) (10/11 0700) Pulse Rate:  [53-132] 71 (10/11 0600) Resp:  [16-26] 18 (10/11 0600) BP: (91-142)/(43-92) 133/72 (10/11 0600) SpO2:  [98 %-100 %] 99 % (10/11 0600) Last BM Date :  (Unknown PTA)  General:  sedated female in NAD Psych:  unable to assess Nose: No deformity, discharge or lesions. Small bowel feeding tube in place Neck:  Supple, no masses felt Lungs:  Clear to auscultation.  Heart:  Regular rate, regular rhythm. No lower extremity edema Abdomen:  Soft, nondistended,  active bowel sounds, no masses felt Rectal :  Deferred Msk: Symmetrical without gross deformities. In wrist restraints Neurologic:  sedated Skin:  Intact without significant lesions.    Intake/Output from previous day: 10/10 0701 - 10/11 0700 In: 5237.8 [I.V.:2973.4; NG/GT:290; IV Piggyback:1944.4] Out: 2100 [Urine:2100] Intake/Output this shift:  Total I/O In: 231.3 [I.V.:103.6; IV Piggyback:127.7] Out: -     Principal Problem:   Encephalopathy Active Problems:   Altered mental status   Acute drug withdrawal syndrome ([place])    Tye Savoy, NP-C @  06/20/2022, 10:37 AM

## 2022-06-20 NOTE — Progress Notes (Signed)
Initial Nutrition Assessment  DOCUMENTATION CODES:   Not applicable  INTERVENTION:  -Initiate Osmolite 1.5 @ 80m/hr with 658mProsource q day.  -As tolerated, increase EN by 1030m 12hrs to goal rate of 30m36m x 24hrs (1440mL28mal volume) with 30mL 28mource daily to provide 2240kcal, 110g protein and 1097mL f55mwater.  -Monitor lytes and replete prn -Recommend checking Vitamin C, Vitamin A, and iron  NUTRITION DIAGNOSIS:  Inadequate oral intake related to lethargy/confusion as evidenced by NPO status.  GOAL:  Patient will meet greater than or equal to 90% of their needs  MONITOR:  TF tolerance, Diet advancement  REASON FOR ASSESSMENT:  Consult Enteral/tube feeding initiation and management  ASSESSMENT:  Pt is a 41yo F 41yo PMH of HTN, ETOH abuse, liver disease, hepatic encephalopathy, and cirrhosis who presents with with desire for alcohol cessation.  Pt unable to safely consume po diet. NGT placed for nutrition support and medications. Lactic acid is now WNL. MAP maintaining >65 with stable dose of phenylephrine. Receiving precedex. Recommend initiating Osmolite 1.5 @ 20mL/hr41mh 30mL Pro28mce q day. As tolerated, increase EN by 10mL q 1238mto goal rate of 30mL/hr x 62ms (1440mL total 71mme) with 30mL Prosour42maily to provide 2240kcal, 110g protein and 1097mL free wat35mMonitor lytes and replete prn. Note K low this AM, pt has repletion ordered.   Pt has thiamine and folic acid ordered per alcohol withdrawal protocol, but this will also be beneficial for refeeding. No current vitamin labs available, will hold multivitamin at this time, consider checking Vitamin C, Vitamin A, and iron  Medications reviewed and include: folic acid, thiamine, lactulose, solu-medrol, protonix, KCl, precedex, D5LR, phenylephrine  Labs reviewed: K:3.0, Na:143, Cl:115, BG:122, Alk Phos:148, AST:190, Ammonia:177, Bili:4.0, lactic acid now 1.4 from 7.4   NUTRITION - FOCUSED PHYSICAL  EXAM: Deferred at this time.  Diet Order:   Diet Order             Diet NPO time specified  Diet effective now                   EDUCATION NEEDS:   Not appropriate for education at this time  Skin:  Skin Assessment: Reviewed RN Assessment  Last BM:  PTA  Height:   Ht Readings from Last 1 Encounters:  06/19/22 _0  (1.626 m)    Weight:  Wt Readings from Last 1 Encounters:  06/19/22 70.5 kg    BMI:  Body mass index is 26.68 kg/m.  Estimated Nutritional Needs:  Kcal:  2115-2470kcal  Protein:  85-110g  Fluid:  1765mL  Katie Bu71ma, Candise BowensCNSC See AMiON for contact information

## 2022-06-20 NOTE — Progress Notes (Signed)
RN spoke with pt husband via phone.  States he will be out of town tomorrow.  Daughter listed in patient contact will be local and pt's husband states she is to be point of contact for the day.  Publishing copy aware and approved.  Justice Britain, RN 06/20/2022 5:41 PM

## 2022-06-20 NOTE — Consult Note (Signed)
[facility]       Reason for Consult:  bacteremia   Referring Physician: CHAMP autoconsult  Principal Problem:   Encephalopathy Active Problems:   Altered mental status   Acute drug withdrawal syndrome (HCC)    Chlorhexidine Gluconate Cloth  6 each Topical Q2200   feeding supplement (PROSource TF20)  60 mL Per Tube Daily   folic acid  1 mg Per Tube Daily   lactulose  30 g Per Tube TID   pantoprazole  40 mg Per Tube Daily   prednisoLONE  40 mg Per Tube Daily   rifaximin  550 mg Per Tube BID   thiamine  100 mg Per Tube Daily   Or   thiamine  100 mg Intravenous Daily    Recommendations: Continue cefazolin Repeat blood cul tures  Assessment: She has one positive blood culture with Staph aureus.  It is a bit unclear why the blood cultures were drawn with no leukocytosis, no fever or concerns for infection.  At this time, it is positive in one bottle only.  Will continue to monitor.  TTE done.    Antibiotics: cefazolin  HPI: Joann [name] is a 41 y.o. female with a history of alcohol abuse and presented after trying to detox at home and came in confused in delirium tremens.  Patient is currently on precedex drip and pressor support.     Review of Systems:  Unable to be assessed due to mental status  Past Medical History:  Diagnosis Date   [redacted] weeks gestation of pregnancy 03/22/2020   Alcohol abuse    Alcohol withdrawal delirium ([place]) 02/15/2022   Alcohol-induced acute pancreatitis    Alcoholic ketoacidosis 51/70/0174   Anxiety    DTs (delirium tremens) ([place])    Elevated LFTs 10/24/2018   Encephalopathy acute    Hepatic encephalopathy ([place]) 12/24/2019   Hypertension    Hypokalemia 04/02/2019   Hypotension 02/24/2022   Liver disease     Social History   Tobacco Use   Smoking status: Never   Smokeless tobacco: Never  Vaping Use   Vaping Use: Never used  Substance Use Topics   Alcohol use: Yes    Comment: "I just do it"   Drug use:  Never    Family History  Problem Relation Age of Onset   Healthy Mother    Alcohol abuse Mother        quit in her 67s   Alcohol abuse Father    Diabetes Father    Healthy Brother    Hypertension Maternal Grandmother    Hypertension Maternal Grandfather    Healthy Brother    Healthy Brother     Allergies  Allergen Reactions   Latex Hives    Physical Exam: Constitutional: in no apparent distress  Vitals:   06/20/22 0600 06/20/22 0700  BP: 133/72   Pulse: 71   Resp: 18   Temp:  99 F (37.2 C)  SpO2: 99%    EYES: anicteric Cardiovascular: tachy Respiratory: normal respiratory effort GI: soft Musculoskeletal: no edema  Lab Results  Component Value Date   WBC 3.4 (L) 06/20/2022   HGB 7.2 (L) 06/20/2022   HCT 25.6 (L) 06/20/2022   MCV 79.8 (L) 06/20/2022   PLT 23 (LL) 06/20/2022    Lab Results  Component Value Date   CREATININE 0.62 06/20/2022   BUN 7 06/20/2022   NA 143 06/20/2022   K 3.0 (L) 06/20/2022   CL 115 (H) 06/20/2022  CO2 20 (L) 06/20/2022    Lab Results  Component Value Date   ALT 26 06/20/2022   AST 190 (H) 06/20/2022   ALKPHOS 148 (H) 06/20/2022     Microbiology: Recent Results (from the past 240 hour(s))  Blood culture (routine x 2)     Status: None (Preliminary result)   Collection Time: 06/18/22  8:40 PM   Specimen: BLOOD  Result Value Ref Range Status   Specimen Description   Final    BLOOD BLOOD LEFT ARM Performed at [facility] [address] [place], [place] [address]    Special Requests   Final    BOTTLES DRAWN AEROBIC AND ANAEROBIC Blood Culture results may not be optimal due to an inadequate volume of blood received in culture bottles Performed at [facility] [address], [place], [place] [address]    Culture   Final    NO GROWTH < 12 HOURS Performed at [facility] [address], [place], [place] [address]    Report Status PENDING  Incomplete  Blood culture  (routine x 2)     Status: Abnormal (Preliminary result)   Collection Time: 06/18/22 10:34 PM   Specimen: BLOOD  Result Value Ref Range Status   Specimen Description   Final    BLOOD RIGHT ANTECUBITAL Performed at [facility], [place] [address] [place], [place] [address]    Special Requests   Final    Blood Culture adequate volume BOTTLES DRAWN AEROBIC AND ANAEROBIC Performed at [facility] [address], [place], [place] [address]    Culture  Setup Time   Final    GRAM POSITIVE COCCI IN PAIRS BOTTLES DRAWN AEROBIC ONLY CRITICAL RESULT CALLED TO, READ BACK BY AND VERIFIED WITH: PHARMD NICK GLOGOVAC ON 06/19/22 @ 1734 BY DRT      Culture (A)  Final    STAPHYLOCOCCUS AUREUS SUSCEPTIBILITIES TO FOLLOW Performed at [facility], [place] [address], [place], [place] [address]    Report Status PENDING  Incomplete  Blood Culture ID Panel (Reflexed)     Status: Abnormal   Collection Time: 06/18/22 10:34 PM  Result Value Ref Range Status   Enterococcus faecalis NOT DETECTED NOT DETECTED Final   Enterococcus Faecium NOT DETECTED NOT DETECTED Final   Listeria monocytogenes NOT DETECTED NOT DETECTED Final   Staphylococcus species DETECTED (A) NOT DETECTED Final    Comment: CRITICAL RESULT CALLED TO, READ BACK BY AND VERIFIED WITH: PHARMD NICK GLOGOVAC ON 06/19/22 @ 1734 BY DRT      Staphylococcus aureus (BCID) DETECTED (A) NOT DETECTED Final    Comment: CRITICAL RESULT CALLED TO, READ BACK BY AND VERIFIED WITH: PHARMD NICK GLOGOVAC ON 06/19/22 @ 1734 BY DRT      Staphylococcus epidermidis NOT DETECTED NOT DETECTED Final   Staphylococcus lugdunensis NOT DETECTED NOT DETECTED Final   Streptococcus species NOT DETECTED NOT DETECTED Final   Streptococcus agalactiae NOT DETECTED NOT DETECTED Final   Streptococcus pneumoniae NOT DETECTED NOT DETECTED Final   Streptococcus pyogenes NOT DETECTED NOT DETECTED Final   A.calcoaceticus-baumannii NOT DETECTED NOT  DETECTED Final   Bacteroides fragilis NOT DETECTED NOT DETECTED Final   Enterobacterales NOT DETECTED NOT DETECTED Final   Enterobacter cloacae complex NOT DETECTED NOT DETECTED Final   Escherichia coli NOT DETECTED NOT DETECTED Final   Klebsiella aerogenes NOT DETECTED NOT DETECTED Final   Klebsiella oxytoca NOT DETECTED NOT DETECTED Final   Klebsiella pneumoniae NOT DETECTED  NOT DETECTED Final   Proteus species NOT DETECTED NOT DETECTED Final   Salmonella species NOT DETECTED NOT DETECTED Final   Serratia marcescens NOT DETECTED NOT DETECTED Final   Haemophilus influenzae NOT DETECTED NOT DETECTED Final   Neisseria meningitidis NOT DETECTED NOT DETECTED Final   Pseudomonas aeruginosa NOT DETECTED NOT DETECTED Final   Stenotrophomonas maltophilia NOT DETECTED NOT DETECTED Final   Candida albicans NOT DETECTED NOT DETECTED Final   Candida auris NOT DETECTED NOT DETECTED Final   Candida glabrata NOT DETECTED NOT DETECTED Final   Candida krusei NOT DETECTED NOT DETECTED Final   Candida parapsilosis NOT DETECTED NOT DETECTED Final   Candida tropicalis NOT DETECTED NOT DETECTED Final   Cryptococcus neoformans/gattii NOT DETECTED NOT DETECTED Final   Meth resistant mecA/C and MREJ NOT DETECTED NOT DETECTED Final    Comment: Performed at [facility], [place] [address] [place], [place] [address]  MRSA Next Gen by PCR, Nasal     Status: None   Collection Time: 06/19/22  4:02 AM   Specimen: Nasal Mucosa; Nasal Swab  Result Value Ref Range Status   MRSA by PCR Next Gen NOT DETECTED NOT DETECTED Final    Comment: (NOTE) The GeneXpert MRSA Assay (FDA approved for NASAL specimens only), is one component of a comprehensive MRSA colonization surveillance program. It is not intended to diagnose MRSA infection nor to guide or monitor treatment for MRSA infections. Test performance is not FDA approved in patients less than 7 years old. Performed at [facility], [place]  [address], [place], [place] [address]     Takara Sermons W Dastan Krider, MD [email or web address] 06/20/2022, 11:27 AM

## 2022-06-20 NOTE — Progress Notes (Signed)
Called Gretchin, RN with e-link to inform that the patient's HR is averaging in the 40's (currently 48). On precedex 0.3 (down from 0.4), patient not responding to painful stilmuli. Per Gretchin, if the patient's blood pressure is stable (curretly 135/78) it is ok for patient's HR to be in mid/high 40's. This nurse is to notify of HR <40. Continue to titrate precedex to 0.2 and reassess patient's mentation.

## 2022-06-20 NOTE — Progress Notes (Signed)
[facility] ADULT ICU REPLACEMENT PROTOCOL   The patient does apply for the [facility] Adult ICU Electrolyte Replacment Protocol based on the criteria listed below:   1.Exclusion criteria: TCTS patients, ECMO patients, and Dialysis patients 2. Is GFR >/= 30 ml/min? Yes.    Patient's GFR today is >60 3. Is SCr </= 2? Yes.   Patient's SCr is 0.69 mg/dL 4. Did SCr increase >/= 0.5 in 24 hours? No. 5.Pt's weight >40kg  Yes.   6. Abnormal electrolyte(s): mag 1.6  7. Electrolytes replaced per protocol 8.  Call MD STAT for K+ </= 2.5, Phos </= 1, or Mag </= 1 Physician:    [name] A 06/20/2022 11:16 PM

## 2022-06-20 NOTE — Progress Notes (Signed)
[facility] ADULT ICU REPLACEMENT PROTOCOL   The patient does apply for the [facility] Adult ICU Electrolyte Replacment Protocol based on the criteria listed below:   1.Exclusion criteria: TCTS patients, ECMO patients, and Dialysis patients 2. Is GFR >/= 30 ml/min? Yes.    Patient's GFR today is >60 3. Is SCr </= 2? Yes.   Patient's SCr is 0.62 mg/dL 4. Did SCr increase >/= 0.5 in 24 hours? No. 5.Pt's weight >40kg  Yes.   6. Abnormal electrolyte(s): K+ 3.0  7. Electrolytes replaced per protocol 8.  Call MD STAT for K+ </= 2.5, Phos </= 1, or Mag </= 1 Physician:  Ignacia Marvel 06/20/2022 4:08 AM

## 2022-06-21 DIAGNOSIS — K7010 Alcoholic hepatitis without ascites: Secondary | ICD-10-CM | POA: Diagnosis not present

## 2022-06-21 DIAGNOSIS — G9340 Encephalopathy, unspecified: Secondary | ICD-10-CM | POA: Diagnosis not present

## 2022-06-21 LAB — COMPREHENSIVE METABOLIC PANEL WITH GFR
ALT: 24 U/L (ref 0–44)
AST: 163 U/L — ABNORMAL HIGH (ref 15–41)
Albumin: 2.7 g/dL — ABNORMAL LOW (ref 3.5–5.0)
Alkaline Phosphatase: 143 U/L — ABNORMAL HIGH (ref 38–126)
Anion gap: 3 — ABNORMAL LOW (ref 5–15)
BUN: 8 mg/dL (ref 6–20)
CO2: 20 mmol/L — ABNORMAL LOW (ref 22–32)
Calcium: 7.8 mg/dL — ABNORMAL LOW (ref 8.9–10.3)
Chloride: 116 mmol/L — ABNORMAL HIGH (ref 98–111)
Creatinine, Ser: 0.58 mg/dL (ref 0.44–1.00)
GFR, Estimated: >60 mL/min
Glucose, Bld: 181 mg/dL — ABNORMAL HIGH (ref 70–99)
Potassium: 3.2 mmol/L — ABNORMAL LOW (ref 3.5–5.1)
Sodium: 139 mmol/L (ref 135–145)
Total Bilirubin: 2.6 mg/dL — ABNORMAL HIGH (ref 0.3–1.2)
Total Protein: 7.5 g/dL (ref 6.5–8.1)

## 2022-06-21 LAB — PROTIME-INR
INR: 2 — ABNORMAL HIGH (ref 0.8–1.2)
Prothrombin Time: 22.3 seconds — ABNORMAL HIGH (ref 11.4–15.2)

## 2022-06-21 LAB — CULTURE, BLOOD (ROUTINE X 2): Special Requests: ADEQUATE

## 2022-06-21 LAB — PREPARE RBC (CROSSMATCH)

## 2022-06-21 LAB — GLUCOSE, CAPILLARY
Glucose-Capillary: 100 mg/dL — ABNORMAL HIGH (ref 70–99)
Glucose-Capillary: 106 mg/dL — ABNORMAL HIGH (ref 70–99)
Glucose-Capillary: 123 mg/dL — ABNORMAL HIGH (ref 70–99)
Glucose-Capillary: 129 mg/dL — ABNORMAL HIGH (ref 70–99)
Glucose-Capillary: 134 mg/dL — ABNORMAL HIGH (ref 70–99)
Glucose-Capillary: 143 mg/dL — ABNORMAL HIGH (ref 70–99)
Glucose-Capillary: 148 mg/dL — ABNORMAL HIGH (ref 70–99)

## 2022-06-21 LAB — BASIC METABOLIC PANEL
Anion gap: 5 (ref 5–15)
BUN: 7 mg/dL (ref 6–20)
CO2: 19 mmol/L — ABNORMAL LOW (ref 22–32)
Calcium: 7.9 mg/dL — ABNORMAL LOW (ref 8.9–10.3)
Chloride: 118 mmol/L — ABNORMAL HIGH (ref 98–111)
Creatinine, Ser: 0.39 mg/dL — ABNORMAL LOW (ref 0.44–1.00)
GFR, Estimated: >60 mL/min (ref >60)
Glucose, Bld: 295 mg/dL — ABNORMAL HIGH (ref 70–99)
Potassium: 4.1 mmol/L (ref 3.5–5.1)
Sodium: 142 mmol/L (ref 135–145)

## 2022-06-21 LAB — CBC
HCT: 26.1 % — ABNORMAL LOW (ref 36.0–46.0)
Hemoglobin: 7 g/dL — ABNORMAL LOW (ref 12.0–15.0)
MCH: 22.4 pg — ABNORMAL LOW (ref 26.0–34.0)
MCHC: 26.8 g/dL — ABNORMAL LOW (ref 30.0–36.0)
MCV: 83.7 fL (ref 80.0–100.0)
Platelets: 20 10*3/uL — CL (ref 150–400)
RBC: 3.12 MIL/uL — ABNORMAL LOW (ref 3.87–5.11)
RDW: 28.7 % — ABNORMAL HIGH (ref 11.5–15.5)
WBC: 2.8 10*3/uL — ABNORMAL LOW (ref 4.0–10.5)
nRBC: 0 % (ref 0.0–0.2)

## 2022-06-21 LAB — HEMOGLOBIN AND HEMATOCRIT, BLOOD
HCT: 25.5 % — ABNORMAL LOW (ref 36.0–46.0)
HCT: 26.9 % — ABNORMAL LOW (ref 36.0–46.0)
Hemoglobin: 6.8 g/dL — CL (ref 12.0–15.0)
Hemoglobin: 7.5 g/dL — ABNORMAL LOW (ref 12.0–15.0)

## 2022-06-21 LAB — PHOSPHORUS: Phosphorus: 1.3 mg/dL — ABNORMAL LOW (ref 2.5–4.6)

## 2022-06-21 LAB — LACTIC ACID, PLASMA: Lactic Acid, Venous: 1.2 mmol/L (ref 0.5–1.9)

## 2022-06-21 LAB — MAGNESIUM: Magnesium: 2.9 mg/dL — ABNORMAL HIGH (ref 1.7–2.4)

## 2022-06-21 MED ORDER — POTASSIUM CHLORIDE 20 MEQ PO PACK
40.0000 meq | PACK | Freq: Two times a day (BID) | ORAL | Status: AC
  Administered 2022-06-21 (×2): 40 meq
  Filled 2022-06-21 (×2): qty 2

## 2022-06-21 MED ORDER — LACTULOSE 10 GM/15ML PO SOLN
30.0000 g | Freq: Four times a day (QID) | ORAL | Status: DC
  Administered 2022-06-21 – 2022-06-24 (×12): 30 g
  Filled 2022-06-21 (×12): qty 45

## 2022-06-21 MED ORDER — ALBUMIN HUMAN 25 % IV SOLN
50.0000 g | Freq: Once | INTRAVENOUS | Status: AC
  Administered 2022-06-21: 50 g via INTRAVENOUS
  Filled 2022-06-21: qty 200

## 2022-06-21 MED ORDER — LACTATED RINGERS IV SOLN: INTRAVENOUS | Status: DC

## 2022-06-21 MED ORDER — SODIUM CHLORIDE 0.9% IV SOLUTION: Freq: Once | INTRAVENOUS | Status: AC

## 2022-06-21 MED ORDER — POTASSIUM PHOSPHATES 15 MMOLE/5ML IV SOLN
45.0000 mmol | Freq: Once | INTRAVENOUS | Status: AC
  Administered 2022-06-21: 45 mmol via INTRAVENOUS
  Filled 2022-06-21: qty 15

## 2022-06-21 MED ORDER — HYDRALAZINE HCL 20 MG/ML IJ SOLN: 5.0000 mg | INTRAMUSCULAR | Status: DC | PRN

## 2022-06-21 MED ORDER — DEXMEDETOMIDINE HCL IN NACL 400 MCG/100ML IV SOLN
0.4000 ug/kg/h | INTRAVENOUS | Status: DC
  Administered 2022-06-21: 0.8 ug/kg/h via INTRAVENOUS
  Administered 2022-06-21: 0.7 ug/kg/h via INTRAVENOUS
  Administered 2022-06-21: 1 ug/kg/h via INTRAVENOUS
  Administered 2022-06-22 (×2): 0.8 ug/kg/h via INTRAVENOUS
  Administered 2022-06-22: 1.1 ug/kg/h via INTRAVENOUS
  Administered 2022-06-22: 1 ug/kg/h via INTRAVENOUS
  Filled 2022-06-21 (×7): qty 100

## 2022-06-21 MED ORDER — HALOPERIDOL LACTATE 5 MG/ML IJ SOLN
2.0000 mg | INTRAMUSCULAR | Status: DC | PRN
  Administered 2022-06-22 – 2022-06-27 (×16): 2 mg via INTRAVENOUS
  Filled 2022-06-21 (×16): qty 1

## 2022-06-21 NOTE — Progress Notes (Addendum)
**Joann [name]** Joann Joann [name]  CC:  Alcohol associated hepatitis/cirrhosis   Subjective: She is unresponsive/sedated. RN reported she was extremely agitated and disruptive yesterday and last night.  No family at the bedside. On Precedex infusion.   Objective:  Vital signs in last 24 hours: Temp:  [95 F (35 C)-98.4 F (36.9 C)] 97.6 F (36.4 C) (10/12 0614) Pulse Rate:  [44-123] 50 (10/12 0600) Resp:  [6-24] 19 (10/12 0600) BP: (107-140)/(65-94) 110/70 (10/12 0600) SpO2:  [89 %-100 %] 97 % (10/12 0600) Weight:  [79 kg] 79 kg (10/12 0500) Last BM Date : 06/21/22  General: Critically ill 41 year old female.  Heart: Regular rate and rhythm, no murmur. Pulm: Breath sounds clear throughout, decreased in the bases. Abdomen: Soft, nondistended.  No obvious tenderness.  No rebound or guarding.  No palpable mass.  NG tube with tube feedings intact.  No ascites.  Hypoactive bowel sounds to all 4 quadrants.  No abdominal bruit. Extremities:  Without edema. Neurologic: Pupils are pinpoint.  Unresponsive.  Sedated.  Intake/Output from previous day: 10/11 0701 - 10/12 0700 In: 2716.9 [I.V.:1487.5; NG/GT:427.8; IV Piggyback:801.5] Out: 1550 [Urine:1550] Intake/Output this shift: No intake/output data recorded.  Lab Results: Recent Labs    06/19/22 2123 06/20/22 0307 06/21/22 0213  WBC 2.4* 3.4* 2.8*  HGB 7.1* 7.2* 7.0*  HCT 26.2* 25.6* 26.1*  PLT 21* 23* 20*   BMET Recent Labs    06/20/22 0307 06/20/22 2114 06/21/22 0213  NA 143 141 139  K 3.0* 3.9 3.2*  CL 115* 117* 116*  CO2 20* 20* 20*  GLUCOSE 122* 119* 181*  BUN 7 7 8   CREATININE 0.62 0.57 0.58  CALCIUM 7.6* 7.8* 7.8*   LFT Recent Labs    06/21/22 0213  PROT 7.5  ALBUMIN 2.7*  AST 163*  ALT 24  ALKPHOS 143*  BILITOT 2.6*   PT/INR Recent Labs    06/18/22 2230 06/21/22 0213  LABPROT 20.2* 22.3*  INR 1.7* 2.0*   Hepatitis Panel No results for input(s): "HEPBSAG", "HCVAB", "HEPAIGM",  "HEPBIGM" in the last 72 hours.  [place] EKG SITE RITE  Result Date: 06/19/2022 If Site Rite image not attached, placement could not be confirmed due to current cardiac rhythm.  US Abdomen Limited RUQ (LIVER/GB)  Result Date: 06/19/2022 CLINICAL DATA:  Cirrhosis EXAM: ULTRASOUND ABDOMEN LIMITED RIGHT UPPER QUADRANT COMPARISON:  CT abdomen pelvis 02/24/2022 FINDINGS: Gallbladder: Surgically absent Common bile duct: Diameter: 2.7 mm Liver: Increased echogenicity liver diffusely. Nodular liver contour compatible with cirrhosis. No liver mass. Suboptimal evaluation of portal vein. Patient not able to hold still. Other: None. IMPRESSION: Postop cholecystectomy.  No biliary dilatation Cirrhosis liver Electronically Signed   By: Franchot Gallo M.D.   On: 06/19/2022 15:46   ECHOCARDIOGRAM COMPLETE  Result Date: 06/19/2022    ECHOCARDIOGRAM REPORT   Patient Name:   Joann Joann [name] Date of Exam: 06/19/2022 Medical Rec #:  [id number]   Height:       64.0 in Accession #:    [id number]  Weight:       155.4 lb Date of Birth:  September 18, 1980   BSA:          1.758 m Patient Age:    55 years    BP:           103/53 mmHg Patient Gender: F           HR:           73 bpm. Exam Location:  Inpatient  Procedure: 2D Echo Indications:    Cirrhosis  History:        Patient has no prior history of Echocardiogram examinations.  Sonographer:    Harvie Junior Referring Phys: [place]  1. Left ventricular ejection fraction, by estimation, is 60 to 65%. The left ventricle has normal function. The left ventricle has no regional wall motion abnormalities. Left ventricular diastolic parameters were normal.  2. Right ventricular systolic function is normal. The right ventricular size is normal. There is normal pulmonary artery systolic pressure.  3. The mitral valve is normal in structure. No evidence of mitral valve regurgitation. No evidence of mitral stenosis.  4. The aortic valve is tricuspid. Aortic valve regurgitation is  not visualized. No aortic stenosis is present. Comparison(s): No prior Echocardiogram. Conclusion(s)/Recommendation(s): Normal biventricular function without evidence of hemodynamically significant valvular heart disease. FINDINGS  Left Ventricle: Left ventricular ejection fraction, by estimation, is 60 to 65%. The left ventricle has normal function. The left ventricle has no regional wall motion abnormalities. The left ventricular internal cavity size was normal in size. There is  no left ventricular hypertrophy. Left ventricular diastolic parameters were normal. Right Ventricle: The right ventricular size is normal. No increase in right ventricular wall thickness. Right ventricular systolic function is normal. There is normal pulmonary artery systolic pressure. The tricuspid regurgitant velocity is 1.75 m/s, and  with an assumed right atrial pressure of 3 mmHg, the estimated right ventricular systolic pressure is 98.3 mmHg. Left Atrium: Left atrial size was normal in size. Right Atrium: Right atrial size was normal in size. Pericardium: There is no evidence of pericardial effusion. Mitral Valve: The mitral valve is normal in structure. No evidence of mitral valve regurgitation. No evidence of mitral valve stenosis. Tricuspid Valve: The tricuspid valve is normal in structure. Tricuspid valve regurgitation is not demonstrated. No evidence of tricuspid stenosis. Aortic Valve: The aortic valve is tricuspid. Aortic valve regurgitation is not visualized. No aortic stenosis is present. Aortic valve mean gradient measures 5.0 mmHg. Aortic valve peak gradient measures 9.9 mmHg. Aortic valve area, by VTI measures 2.77 cm. Pulmonic Valve: The pulmonic valve was normal in structure. Pulmonic valve regurgitation is not visualized. No evidence of pulmonic stenosis. Aorta: The aortic root is normal in size and structure. IAS/Shunts: No atrial level shunt detected by color flow Doppler.  LEFT VENTRICLE PLAX 2D LVIDd:          5.00 cm      Diastology LVIDs:         3.20 cm      LV e' medial:    10.70 cm/s LV PW:         0.70 cm      LV E/e' medial:  8.1 LV IVS:        0.70 cm      LV e' lateral:   13.10 cm/s LVOT diam:     2.10 cm      LV E/e' lateral: 6.6 LV SV:         86 LV SV Index:   49 LVOT Area:     3.46 cm  LV Volumes (MOD) LV vol d, MOD A2C: 111.0 ml LV vol d, MOD A4C: 121.0 ml LV vol s, MOD A2C: 45.0 ml LV vol s, MOD A4C: 39.7 ml LV SV MOD A2C:     66.0 ml LV SV MOD A4C:     121.0 ml LV SV MOD BP:      74.9 ml RIGHT VENTRICLE  RV Basal diam:  3.10 cm RV Mid diam:    2.50 cm RV S prime:     14.50 cm/s TAPSE (M-mode): 2.7 cm LEFT ATRIUM             Index        RIGHT ATRIUM           Index LA diam:        3.40 cm 1.93 cm/m   RA Area:     10.90 cm LA Vol (A2C):   43.7 ml 24.86 ml/m  RA Volume:   24.50 ml  13.94 ml/m LA Vol (A4C):   40.4 ml 22.99 ml/m LA Biplane Vol: 44.8 ml 25.49 ml/m  AORTIC VALVE                     PULMONIC VALVE AV Area (Vmax):    2.78 cm      PV Vmax:       1.14 m/s AV Area (Vmean):   2.67 cm      PV Peak grad:  5.2 mmHg AV Area (VTI):     2.77 cm AV Vmax:           157.00 cm/s AV Vmean:          105.000 cm/s AV VTI:            0.311 m AV Peak Grad:      9.9 mmHg AV Mean Grad:      5.0 mmHg LVOT Vmax:         126.00 cm/s LVOT Vmean:        81.000 cm/s LVOT VTI:          0.249 m LVOT/AV VTI ratio: 0.80  AORTA Ao Root diam: 3.40 cm MITRAL VALVE               TRICUSPID VALVE MV Area (PHT): 3.99 cm    TR Peak grad:   12.2 mmHg MV Decel Time: 190 msec    TR Vmax:        175.00 cm/s MR Peak grad: 20.8 mmHg MR Vmax:      228.00 cm/s  SHUNTS MV E velocity: 87.00 cm/s  Systemic VTI:  0.25 m MV A velocity: 52.50 cm/s  Systemic Diam: 2.10 cm MV E/A ratio:  1.66 Rudean Haskell MD Electronically signed by Rudean Haskell MD Signature Date/Time: 06/19/2022/3:39:11 PM    Final    DG Abd 1 View  Result Date: 06/19/2022 CLINICAL DATA:  Feeding tube placement EXAM: ABDOMEN - 1 VIEW COMPARISON:   04/16/2022 FINDINGS: A weighted tip feeding tube has been advanced, tip near the pylorus. The stomach is decompressed. Visualized bowel gas pattern unremarkable; lower abdomen excluded. Cholecystectomy clips noted. IMPRESSION: Feeding tube tip near the pylorus. Electronically Signed   By: Lucrezia Europe M.D.   On: 06/19/2022 14:34   DG CHEST PORT 1 VIEW  Result Date: 06/19/2022 CLINICAL DATA:  Chronic alcohol abuse presenting with alcohol withdrawal. EXAM: PORTABLE CHEST 1 VIEW COMPARISON:  Chest x-ray February 23, 2022 FINDINGS: The cardiomediastinal silhouette is unchanged in contour. Low lung volumes with bronchovascular crowding. No focal pulmonary opacity. No pleural effusion or pneumothorax. Visualized upper abdomen is unremarkable. No acute osseous abnormality. IMPRESSION: No acute cardiopulmonary abnormality. Electronically Signed   By: Beryle Flock M.D.   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 Numerous hospital admissions for acute alcohol associated hepatitis, unable to achieve long-term abstinence from alcohol. Not a liver transplant candidate. Re-admitted to the hospital 06/18/2022 with severe alcohol associated hepatitis, alcohol withdrawal and hepatic encephalopathy. Admission MDF 35.4.  On IV steroids, to start Prednisolone today.  Total bili 4.0 -> 2.6.  AST 163.  ALT 24.  Alkaline phos 43.  Albumin 2.7.  Ammonia 176 on 10/11. Normal renal  function. RUQ sono 06/19/2022 consistent with cirrhosis, no hepatoma with suboptimal evaluation of the portal vein.  Significant agitation yesterday and last night, sedated on Precedex. -Continue Xifaxan and Lactulose 30gm per NGT tid, titrate to no more than 3 - 4 loose stools daily -Prednisolone 40 mg p.o.  daily -Check [place] score in 7 days -CIWA protocol in process -Continue folate and thiamine -Monitor neuro and respiratory status closely -Continue Pantoprazole 40 mg twice daily per NG tube -Recommend inpatient alcohol rehab -Await further recommendations per Dr. Carlean Purl  Acute on chronic anemia. Hg 7.2 -> 7.0. No overt GI bleeding.  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  INR 1.7 -> 2.0.  Severe thrombocytopenia. PLT 23 -> 20.   Hypokalemia. -KCl replacement per the hospitalist  Blood culture + for Staph aureus. On Cefazolin IV.  -ID following      Principal Problem:   Encephalopathy Active Problems:   Altered mental status   Acute drug withdrawal syndrome ([place])     LOS: 3 days   Noralyn Pick  06/21/2022, 9:53AM

## 2022-06-21 NOTE — Progress Notes (Signed)
Chaplain engaged in an initial visit with Joann [name]'s daughter, Rob Bunting.  Chaplain introduced her role and let Joann [name] know that Chaplains are there to support her as she shows up for her mom.  Joann [name] noted that she is used to her moms screams during withdrawal.  She is also the oldest of three siblings, at 67, and is the main point of contact right now.   Chaplain offered supportive and compassionate presence, and support.    06/21/22 1400  Clinical Encounter Type  Visited With Patient not available;Family  Visit Type Initial

## 2022-06-21 NOTE — Progress Notes (Signed)
Patient unresponsive and bradycardic this morning. Patient appeared to be slightly blocking her airway at times with her tongue. Precedex titrated downwards. PT intermittently having episodes of extreme agitation. Yelling "NO" and talking to someone named "[name]" and saying "STOP" Ativan given per CIWA scale, but appears to no longer be effective. Will continue to titrate precedex as needed. Tube feeds have been stopped since this morning because of extremely high risk for aspiration.

## 2022-06-21 NOTE — Progress Notes (Signed)
[facility]   Reason for visit: Follow up on positive blood culture  Interval History: she is off precedex, yelling out, agitated.   Day 2 antibiotics  Physical Exam: Constitutional:  Vitals:   06/21/22 1000 06/21/22 1100  BP: (!) 92/59 (!) 87/60  Pulse: 67 66  Resp: 17 (!) 23  Temp:    SpO2: 95% 94%   Patient is yelling out Respiratory: Normal respiratory effort   Review of Systems: Constitutional: negative for fevers and chills  Lab Results  Component Value Date   WBC 2.8 (L) 06/21/2022   HGB 7.0 (L) 06/21/2022   HCT 26.1 (L) 06/21/2022   MCV 83.7 06/21/2022   PLT 20 (LL) 06/21/2022    Lab Results  Component Value Date   CREATININE 0.58 06/21/2022   BUN 8 06/21/2022   NA 139 06/21/2022   K 3.2 (L) 06/21/2022   CL 116 (H) 06/21/2022   CO2 20 (L) 06/21/2022    Lab Results  Component Value Date   ALT 24 06/21/2022   AST 163 (H) 06/21/2022   ALKPHOS 143 (H) 06/21/2022     Microbiology: Recent Results (from the past 240 hour(s))  Blood culture (routine x 2)     Status: None (Preliminary result)   Collection Time: 06/18/22  8:40 PM   Specimen: BLOOD  Result Value Ref Range Status   Specimen Description   Final    BLOOD BLOOD LEFT ARM Performed at [facility], [place] [address] [place], [place] [address]    Special Requests   Final    BOTTLES DRAWN AEROBIC AND ANAEROBIC Blood Culture results may not be optimal due to an inadequate volume of blood received in culture bottles Performed at [facility] [address] [place], [place] [address]    Culture   Final    NO GROWTH 2 DAYS Performed at [facility] [address] [place], [place] [address]    Report Status PENDING  Incomplete  Blood culture (routine x 2)     Status: Abnormal   Collection Time: 06/18/22 10:34 PM   Specimen: BLOOD  Result Value Ref Range Status   Specimen Description   Final    BLOOD RIGHT  ANTECUBITAL Performed at [facility], [place] [address] [place], [place] [address]    Special Requests   Final    Blood Culture adequate volume BOTTLES DRAWN AEROBIC AND ANAEROBIC Performed at [facility] [address], [place], [place] [address]    Culture  Setup Time   Final    GRAM POSITIVE COCCI IN PAIRS BOTTLES DRAWN AEROBIC ONLY CRITICAL RESULT CALLED TO, READ BACK BY AND VERIFIED WITH: PHARMD NICK GLOGOVAC ON 06/19/22 @ 1734 BY DRT   Performed at [facility], [place] [address] [place], [place] [address]    Culture STAPHYLOCOCCUS AUREUS (A)  Final   Report Status 06/21/2022 FINAL  Final   Organism ID, Bacteria STAPHYLOCOCCUS AUREUS  Final      Susceptibility   Staphylococcus aureus - MIC*    CIPROFLOXACIN <=0.5 SENSITIVE Sensitive     ERYTHROMYCIN <=0.25 SENSITIVE Sensitive     GENTAMICIN <=0.5 SENSITIVE Sensitive     OXACILLIN <=0.25 SENSITIVE Sensitive     TETRACYCLINE <=1 SENSITIVE Sensitive     VANCOMYCIN 1 SENSITIVE Sensitive     TRIMETH/SULFA <=10 SENSITIVE Sensitive     CLINDAMYCIN <=0.25 SENSITIVE Sensitive     RIFAMPIN <=0.5 SENSITIVE Sensitive  Inducible Clindamycin NEGATIVE Sensitive     * STAPHYLOCOCCUS AUREUS  Blood Culture ID Panel (Reflexed)     Status: Abnormal   Collection Time: 06/18/22 10:34 PM  Result Value Ref Range Status   Enterococcus faecalis NOT DETECTED NOT DETECTED Final   Enterococcus Faecium NOT DETECTED NOT DETECTED Final   Listeria monocytogenes NOT DETECTED NOT DETECTED Final   Staphylococcus species DETECTED (A) NOT DETECTED Final    Comment: CRITICAL RESULT CALLED TO, READ BACK BY AND VERIFIED WITH: PHARMD NICK GLOGOVAC ON 06/19/22 @ 1734 BY DRT      Staphylococcus aureus (BCID) DETECTED (A) NOT DETECTED Final    Comment: CRITICAL RESULT CALLED TO, READ BACK BY AND VERIFIED WITH: PHARMD NICK GLOGOVAC ON 06/19/22 @ 1734 BY DRT      Staphylococcus epidermidis NOT DETECTED NOT DETECTED Final    Staphylococcus lugdunensis NOT DETECTED NOT DETECTED Final   Streptococcus species NOT DETECTED NOT DETECTED Final   Streptococcus agalactiae NOT DETECTED NOT DETECTED Final   Streptococcus pneumoniae NOT DETECTED NOT DETECTED Final   Streptococcus pyogenes NOT DETECTED NOT DETECTED Final   A.calcoaceticus-baumannii NOT DETECTED NOT DETECTED Final   Bacteroides fragilis NOT DETECTED NOT DETECTED Final   Enterobacterales NOT DETECTED NOT DETECTED Final   Enterobacter cloacae complex NOT DETECTED NOT DETECTED Final   Escherichia coli NOT DETECTED NOT DETECTED Final   Klebsiella aerogenes NOT DETECTED NOT DETECTED Final   Klebsiella oxytoca NOT DETECTED NOT DETECTED Final   Klebsiella pneumoniae NOT DETECTED NOT DETECTED Final   Proteus species NOT DETECTED NOT DETECTED Final   Salmonella species NOT DETECTED NOT DETECTED Final   Serratia marcescens NOT DETECTED NOT DETECTED Final   Haemophilus influenzae NOT DETECTED NOT DETECTED Final   Neisseria meningitidis NOT DETECTED NOT DETECTED Final   Pseudomonas aeruginosa NOT DETECTED NOT DETECTED Final   Stenotrophomonas maltophilia NOT DETECTED NOT DETECTED Final   Candida albicans NOT DETECTED NOT DETECTED Final   Candida auris NOT DETECTED NOT DETECTED Final   Candida glabrata NOT DETECTED NOT DETECTED Final   Candida krusei NOT DETECTED NOT DETECTED Final   Candida parapsilosis NOT DETECTED NOT DETECTED Final   Candida tropicalis NOT DETECTED NOT DETECTED Final   Cryptococcus neoformans/gattii NOT DETECTED NOT DETECTED Final   Meth resistant mecA/C and MREJ NOT DETECTED NOT DETECTED Final    Comment: Performed at [facility], [address] [place], [place] [address]  MRSA Next Gen by PCR, Nasal     Status: None   Collection Time: 06/19/22  4:02 AM   Specimen: Nasal Mucosa; Nasal Swab  Result Value Ref Range Status   MRSA by PCR Next Gen NOT DETECTED NOT DETECTED Final    Comment: (NOTE) The GeneXpert MRSA Assay (FDA approved  for NASAL specimens only), is one component of a comprehensive MRSA colonization surveillance program. It is not intended to diagnose MRSA infection nor to guide or monitor treatment for MRSA infections. Test performance is not FDA approved in patients less than 52 years old. Performed at [facility], [place] [address] [place], [place] [address]   Culture, blood (Routine X 2) w Reflex to ID Panel     Status: None (Preliminary result)   Collection Time: 06/21/22  6:44 AM   Specimen: BLOOD LEFT HAND  Result Value Ref Range Status   Specimen Description   Final    BLOOD LEFT HAND Performed at [facility] [address] [place], [place] [address]    Special  Requests   Final    BOTTLES DRAWN AEROBIC AND ANAEROBIC Blood Culture adequate volume Performed at [facility] [address] [place], [place] [address]    Culture   Final    NO GROWTH < 12 HOURS Performed at [facility] [address], [place], [place] [address]    Report Status PENDING  Incomplete  Culture, blood (Routine X 2) w Reflex to ID Panel     Status: None (Preliminary result)   Collection Time: 06/21/22  6:53 AM   Specimen: BLOOD RIGHT HAND  Result Value Ref Range Status   Specimen Description   Final    BLOOD RIGHT HAND Performed at [facility] [address], [place], [place] [address]    Special Requests   Final    BOTTLES DRAWN AEROBIC AND ANAEROBIC Blood Culture adequate volume Performed at [facility] [address] [place], [place] [address]    Culture   Final    NO GROWTH < 12 HOURS Performed at [facility] [address] [place],  [address]    Report Status PENDING  Incomplete    Impression/Plan:  1. Positive blood culture - 1 bottle positive for Staph aureus.  Other set negative.  She really has had no concerns for an active infection with no fever, no leukocytosis.  She did  require some pressor support secondary to her DTs.   She will continue with cefazolin.  No concerns on TTE. If remaining blood cultures and repeat blood cultures remain negative, will consider 2 weeks of treatment and can use oral therapy at discharge.    2.  Alcoholic cirrhosis  - stable findings and associated with thrombocytopenia and hepatic dysfunction with elevated INR c/w decompensated liver disease.  Followed by GI.  Needs alcohol rehab.   Hepatitis B and C negative earlier this year.   3.  Access - picc line placed due to need for pressor and precedex support.  She will not need this long term.

## 2022-06-21 NOTE — Progress Notes (Signed)
eLink Physician-Brief Progress Note Patient Name: Joann [name] DOB: [birth year] MRN: [id number]   Date of Service  06/21/2022  HPI/Events of Note  Multiple issues: 1. Bradycardia - HR = 48 and BP = 154/71. Likely d/t Precedex IV infusion at 1.1 mcg/kg/hour. QTc interval = 0.465 seconds.   eICU Interventions  Plan: Decrease ceiling dose on Precedex IV infusion to 0.8 mcg/kg/hour. Add Haldol 2 mg IV Q 3 hours PRN agitation. Hydralazine 10 mg IV Q 4 hours PRN SBP > 160 or DBP > 100. Would not treat BP until changes in sedation have had a chance to take effect.      Intervention Category Major Interventions: Delirium, psychosis, severe agitation - evaluation and management;Arrhythmia - evaluation and management;Hypertension - evaluation and management  Lysle Dingwall 06/21/2022, 11:27 PM

## 2022-06-21 NOTE — Progress Notes (Signed)
eLink Physician-Brief Progress Note Patient Name: Joann [name] DOB: [birth year] MRN: [id number]   Date of Service  06/21/2022  HPI/Events of Note  Delirium - Patient received Ativan 2 mg IV at 2:45 AM.  eICU Interventions  Plan: Ativan 2 mg IV now (extra dose).     Intervention Category Major Interventions: Delirium, psychosis, severe agitation - evaluation and management  Joann [name] 06/21/2022, 3:19 AM

## 2022-06-21 NOTE — Progress Notes (Signed)
[name] ADULT ICU REPLACEMENT PROTOCOL   The patient does apply for the [facility] Adult ICU Electrolyte Replacment Protocol based on the criteria listed below:   1.Exclusion criteria: TCTS patients, ECMO patients, and Dialysis patients 2. Is GFR >/= 30 ml/min? Yes.    Patient's GFR today is >60 3. Is SCr </= 2? Yes.   Patient's SCr is 0.58 mg/dL 4. Did SCr increase >/= 0.5 in 24 hours? No. 5.Pt's weight >40kg  Yes.   6. Abnormal electrolyte(s): K 3.0 Phos 1.3  7. Electrolytes replaced per protocol 8.  Call MD STAT for K+ </= 2.5, Phos </= 1, or Mag </= 1  Physician:    [name] A 06/21/2022 6:11 AM

## 2022-06-21 NOTE — Progress Notes (Addendum)
This nurse entered the room to find the patient attempting to exit the bed, screaming/crying. Informed e-link, Gretchen, RN, of this change in mentation. Attempted to verbally console patient, increased precedex gtt, discussed the use of ativan based on patient's CIWA and previous BP drop. Agreed by team to give ativan 1 mg IV and reassess patient's vital signs/mentation.   On reassessment, patient calm in bed with current BP of 140/68 and HR 53.

## 2022-06-21 NOTE — Progress Notes (Signed)
    Alertt hgb <7gm% No active bleed  Recent Labs  Lab 06/19/22 2123 06/20/22 0307 06/21/22 0213 06/21/22 1139  HGB 7.1* 7.2* 7.0* 6.8*  HCT 26.2* 25.6* 26.1* 25.5*  WBC 2.4* 3.4* 2.8*  --   PLT 21* 23* 20*  --     Plan 1 unit PRBC (has receieved PRBC prior admit per RN)    SIGNATURE    Dr. Brand Males, M.D., F.C.C.P,  Pulmonary and Critical Care Medicine Staff Physician, [facility] Director - Interstitial Lung Disease  Program  Medical Director - [facility] [place] [facility] [place], [place], [address]   Pager: [phone], If no answer  -[place] or [place] [phone] Telephone (clinical office): [phone] Telephone (research): [phone]  12:59 PM 06/21/2022

## 2022-06-21 NOTE — Progress Notes (Signed)
eLink Physician-Brief Progress Note Patient Name: Joann [name] DOB: [birth year] MRN: [id number]   Date of Service  06/21/2022  HPI/Events of Note  Multiple issues: 1. Agitation - Already on a Precedex IV infusion and Ativan CIWA Scale Q 2 hours PRN. 2 Starting tube feeds. Currently on D5 LR at 50 mL/hour.   eICU Interventions  Plan: Titrate Precedex IV infusion as ordered.  Change IV fluid to LR at 50 mL/hour.      Intervention Category Major Interventions: Delirium, psychosis, severe agitation - evaluation and management;Other:  Lysle Dingwall 06/21/2022, 9:11 PM

## 2022-06-21 NOTE — Progress Notes (Addendum)
Patient very agitated, screaming/attempting to exit the bed/verbally abusive/hitting bedrails. Gave 65m ativan, increased precedex, called e-link for different PRN/gtt for agitation control. Awaiting response from CCM provider for possible medication changes as patient's BP has previously dropped significantly with large amounts of ativan/precedex.    Discussed with team/provider about patient care/medications. Agreed to cont. With use of ativan PRN per CIWA score and increase precedex as needed. If neo needed for BP drop, will restart.

## 2022-06-21 NOTE — Progress Notes (Signed)
Called e-link in patient's room to inform of current rectal temperature of 95 degrees F. Bare hugger already placed on patient to begin warming. Patient responds to painful stimuli at this time, but is otherwise comfortable in her bed. Patient had a medium BM and while this nurse and staff members were cleaning the patient, the patient's HR dropped into the 30's. Per Elzie Rings, RN, order placed for bare hugger to continue, monitor patient for any changes in HR/rhythm. Continue with current plan of care and keep precedex (current setting 0.7 mcg) settings where they are if patient's BP/HR can tolerate well.

## 2022-06-21 NOTE — Progress Notes (Addendum)
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 She is non verbal on my exam but moving around and sitting up trying to get out of bed so h/o is obtained from chart review and EDP.   Per report pt had been attempting to detox at home for the past couple days, although etoh level is 158. She was unable to disclose her date of last drink but is now having confusion, weakness and fatigue. It is unclear if she has been compliant with her medications at home as well, however with her ammonia at 150's doubtful.    She is unable to provide any further ros or history.   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 Admitted on prexedex gtt and neo gtt added.  Curerntly off.  On 2LNC . Marland Kitchen Afebrile. Normal WBC. Lactate > 6 this am with very ppor clearance t hough AKI resolved. Platelets down to 28K (baseline 23K- 55k).  LFTs are c/w Acute alcholic hepatitis. Discrimiant functin score 26.7 -> Start solumedrol 06/20/22 - concerns from nursing staff yesterday about sharing medical information with husband because of red llock in the  chart.  Clarification from ICU nursing director this morning from legal team: That in the best interest of patient okay to share medical information with her husband especially in the context of critical illness and decision making. urrently on Neo-Synephrine and Precedex and D5 LR.  Also on antibiotics and steroids.  Encephalopathy continues.  Growing staph and BC ID on Ancef added yesterday but remains afebrile. D2 steroids GI consult  Start tube feeds PICC placed  Interim History / Subjective:    06/21/2022 - INR worse at 2.0., Platelets worse to 20K but bili improved to 2.6 and AST improved to 160s.  /hypothermic. WBC down to 2.8K. -> Bair hugger + No  updated on BCID - still staph.  Off pressors. On 3L [place]. Ativan given overnight. Still on precedex gtt -> on and off agoitation. . D3 steroids. On Tube feeds   Objective   Blood pressure 110/70, pulse (!) 50, temperature 97.6 F (36.4 C), temperature source Axillary, resp. rate 19, height 5' 4"  (1.626 m), weight 79 kg, SpO2 97 %.        Intake/Output Summary (Last 24 hours) at 06/21/2022 0732 Last data filed at 06/21/2022 3734 Gross per 24 hour  Intake 2716.85 ml  Output 1550 ml  Net 1166.85 ml   Filed Weights   06/18/22 1834 06/19/22 0200 06/21/22 0500  Weight: 66 kg 70.5 kg 79 kg    General Appearance:  Looks criticall ill Head:  Normocephalic, without  obvious abnormality, atraumatic Eyes:  PERRL - yes, conjunctiva/corneas - jaundiced     Ears:  Normal external ear canals, both ears Nose:  G tube - YES with TF Throat:  ETT TUBE - no , OG tube - no Neck:  Supple,  No enlargement/tenderness/nodules Lungs: Clear to auscultation bilaterally, Heart:  S1 and S2 normal, no murmur, CVP - no.  Pressors - OFF Abdomen:  Soft, no masses, no organomegaly Genitalia / Rectal:  Not done Extremities:  Extremities- intact Skin:  ntact in exposed areas . Sacral area - not examined Neurologic:  Sedation - precedex gtt, ativan prn -> RASS -  -3 . Moves all 4s - uyes. CAM-ICU - agitated when restless . Orientation - x     Resolved Hospital Problem list     Assessment & Plan:   AT risk for intubation due to acute agitated enceophalopathy (CXR clear 06/19/22)  - Present on Admit   06/21/2022 -> currently protecting airway  P:   Intubate if worse O2 for pulse ox goal > 92%    Acute agitated encephalopathy due to DTs/ETOH withdrawal - Present on Admit  10/12 -fluctating and on precedex gtt and ongoing. Lactulose and xifaxan started 06/19/22 via panda. Ammonia still at 170s . 2 x BM on lactulose  P:   Precredex gtt to continue Ativan CIWA protocol Lactulseo/Xifaxan since 06/19/22 -> increase lactulose 06/21/22 Restrains for safety to continue on 06/21/22 RASS goal o to -2   Circulatory shock at admit due to precedex +/- sepsis  10/12 - OFF neo   P:  MAP goal > 65 Maintenance fluids   No prior hx of cardiac diease but at risk for etoh cardiomyopathy - echo normal 06/19/22  P: Monitor  CARDIAC ELECTRICAL A: Sinus tachy  P: Tele     Staph BCID -Present on Admit  06/20/22- afebrile. Normal PCT. Unclear if contaminant  P:   Ancef 06/19/22 >> ID following   AKI at admit   10/10 -resolved  P:  monitor   Sevre lactic acidosis at admit -   10/11 - resolved. Normmal CK  P: Monitor     Alcoholic cirrhosis with portal hypertension and eso varices  but wihtout ascites- Prior to & Present on Admit Acute alcoholic intoxication- Present on Admit Acute alcoholic hepatitis - Present on Admit (disc score 26.7); normal tylenol land sal evel at admit. STArted solumedrol 06/20/22  06/21/22 - Bili  improved but platelet and INR worse.  MELD 18 (similar to yesterday)   P:   Continue steroids D3 prednisolone  - track [place] score on day 7 at 06/26/22 Lactulose continue Continue xifaxan Thiamine/Folic    Chronic anemia - baseline hgb 7.5 - severe - Prior to & Present on Admit  10/12 -  no active bleed. Hgb 7.0  P:  - PRBC for hgb </= 6.9gm%  (will not transfuse 06/21/22)  - exceptions are   -  if ACS susepcted/confirmed then transfuse for hgb </= 8.0gm%,  or    -  active bleeding with hemodynamic instability, then transfuse regardless of hemoglobin value   At at all times try to transfuse 1 unit prbc as possible with exception of active hemorrhage    Moderate - severe thrombocytopenia at baseline (baseline 23-76) - likey due to cirrhos s- Prior to & Present on Admit   10/12- plat 20k and holding. No acive bleed.  P Monitor No heparin/lovenoix/AC Platelet if bleeding or <= 10K and for procedures \ Electrolyte abnormalities Hypo-phsatemia severe 10/12  Hypomagenseemia - mod 10/11 pm  10/12 -pleted by [place]  - replete  At risk for hypoglycemia   P:   monitor  MSK/DERM Baseline moderate to severe PCM - alb 2.8 in [place] - monitpr - scontinue  TF since 06/20/22  Best Practice (right click and "Reselect all SmartList Selections" daily)  PICC line -since 06/20/22 Diet/type: Start TF 06/20/22 DVT prophylaxis: SCD GI prophylaxis: PPI Lines: N/A Foley:  N/A - purewick as of 06/21/22 Code Status:  full code  Last date of multidisciplinary goals of care discussion    Joann [name]  [phone]. He says he called the ER 2h ago 06/19/2022 and was informed "patient is not here". He is upset that ER did not inform him about whereabouts. He says that no one called him yesterday but for admissions nurse last night. He says he was not aware patient is in ICU.  He is upset about this.  He then wanted me to call her Joann [name] mother [phone]-> no answer   - called husband 06/20/22 - kept ringing and LMTCB -> 10:02 AM he called back and gave him update. Explained about combo of cirrhosis and acute alcoholic hepatitis and encephalopathy. Explained that if liver continues to fail, mortality is to be expected.   -> he asked if I did put her on  "red lock" -> I told him I did NOT put the red lock. He did say that nursing director Nelva Bush did call  him and explained that we will share medical infor with him. He said he is very upset and he is the voice for her. He said if she said it in the ER and they put red lock they should not have because "she had delirium". He was on the road as truck driver and had EMS called  -> later daughter Joann [name] (aged 83) came to hospital but was turned awa. She called the doc line and told her we are trying to sort out redlock situation and will let her know -> [phone]   06/21/22 - called husband to update. LMTCB . MELD 18 and improving bili but setbacks are hypothermia, worsening paltelets     ATTESTATION & SIGNATURE   The patient ANNAH JASKO is critically ill with multiple organ systems failure and requires high complexity decision making for assessment and support, frequent evaluation and titration of therapies, application of advanced monitoring technologies and extensive interpretation of multiple databases.   Critical Care Time devoted to patient care services described in this note is  40  Minutes. This time reflects time of care of this signee Dr Brand Males. This critical care time does not reflect procedure time, or teaching time or supervisory time of PA/NP/Med student/Med Resident etc but could involve care discussion time     Dr. Brand Males, M.D., [facility].C.P Pulmonary and Critical Care Medicine Medical Director - [facility] ICU Staff Physician, [place] [facility] Pager: [phone], If no answer or between  15:00h - 7:00h: call [phone]  06/21/2022 7:58 AM     LABS      PULMONARY No results for input(s): "PHART", "PCO2ART", "PO2ART", "HCO3", "TCO2", "O2SAT" in the last 168 hours.  Invalid input(s): "PCO2", "PO2"  CBC Recent Labs  Lab 06/19/22 2123 06/20/22 0307 06/21/22 0213  HGB 7.1* 7.2*  7.0*  HCT 26.2* 25.6* 26.1*  WBC 2.4* 3.4* 2.8*  PLT  21* 23* 20*    COAGULATION Recent Labs  Lab 06/18/22 2230 06/21/22 0213  INR 1.7* 2.0*    CARDIAC  No results for input(s): "TROPONINI" in the last 168 hours. No results for input(s): "PROBNP" in the last 168 hours.   CHEMISTRY Recent Labs  Lab 06/18/22 1929 06/19/22 0258 06/19/22 1257 06/20/22 0307 06/20/22 2114 06/21/22 0213  NA 139 142 143 143 141 139  K 2.5* 2.8* 3.6 3.0* 3.9 3.2*  CL 98 107 112* 115* 117* 116*  CO2 19* 18* 22 20* 20* 20*  GLUCOSE 131* 103* 91 122* 119* 181*  BUN 13 10 9 7 7 8   CREATININE 1.41* 0.81 0.69 0.62 0.57 0.58  CALCIUM 8.3* 7.3* 7.2* 7.6* 7.8* 7.8*  MG 2.0  --   --   --  1.6* 2.9*  PHOS  --   --   --   --   --  1.3*   Estimated Creatinine Clearance: 92.2 mL/min (by C-G formula based on SCr of 0.58 mg/dL).   LIVER Recent Labs  Lab 06/18/22 1929 06/18/22 2230 06/19/22 1257 06/20/22 0307 06/21/22 0213  AST 275*  --  198* 190* 163*  ALT 36  --  25 26 24   ALKPHOS 195*  --  143* 148* 143*  BILITOT 3.7*  --  3.9* 4.0* 2.6*  PROT 10.5*  --  7.4 7.7 7.5  ALBUMIN 4.0  --  2.9* 2.9* 2.7*  INR  --  1.7*  --   --  2.0*     INFECTIOUS Recent Labs  Lab 06/19/22 1230 06/19/22 1257 06/20/22 0307 06/21/22 0213  LATICACIDVEN 2.1*  --  1.4 1.2  PROCALCITON  --  <0.10  --   --      ENDOCRINE CBG (last 3)  Recent Labs    06/20/22 2126 06/21/22 0025 06/21/22 0426  GLUCAP 134* 134* 100*         IMAGING x48h  - image(s) personally visualized  -   highlighted in bold [place] EKG SITE RITE  Result Date: 06/19/2022 If Site Rite image not attached, placement could not be confirmed due to current cardiac rhythm.  US Abdomen Limited RUQ (LIVER/GB)  Result Date: 06/19/2022 CLINICAL DATA:  Cirrhosis EXAM: ULTRASOUND ABDOMEN LIMITED RIGHT UPPER QUADRANT COMPARISON:  CT abdomen pelvis 02/24/2022 FINDINGS: Gallbladder: Surgically absent Common bile duct: Diameter: 2.7 mm Liver:  Increased echogenicity liver diffusely. Nodular liver contour compatible with cirrhosis. No liver mass. Suboptimal evaluation of portal vein. Patient not able to hold still. Other: None. IMPRESSION: Postop cholecystectomy.  No biliary dilatation Cirrhosis liver Electronically Signed   By: Franchot Gallo M.D.   On: 06/19/2022 15:46   ECHOCARDIOGRAM COMPLETE  Result Date: 06/19/2022    ECHOCARDIOGRAM REPORT   Patient Name:   CRISTI GWYNN Date of Exam: 06/19/2022 Medical Rec #:  [id number]   Height:       64.0 in Accession #:    [id number]  Weight:       155.4 lb Date of Birth:  April 12, 1981   BSA:          1.758 m Patient Age:    48 years    BP:           103/53 mmHg Patient Gender: F           HR:           73 bpm. Exam Location:  Inpatient Procedure: 2D Echo Indications:    Cirrhosis  History:  Patient has no prior history of Echocardiogram examinations.  Sonographer:    Harvie Junior Referring Phys: [place]  1. Left ventricular ejection fraction, by estimation, is 60 to 65%. The left ventricle has normal function. The left ventricle has no regional wall motion abnormalities. Left ventricular diastolic parameters were normal.  2. Right ventricular systolic function is normal. The right ventricular size is normal. There is normal pulmonary artery systolic pressure.  3. The mitral valve is normal in structure. No evidence of mitral valve regurgitation. No evidence of mitral stenosis.  4. The aortic valve is tricuspid. Aortic valve regurgitation is not visualized. No aortic stenosis is present. Comparison(s): No prior Echocardiogram. Conclusion(s)/Recommendation(s): Normal biventricular function without evidence of hemodynamically significant valvular heart disease. FINDINGS  Left Ventricle: Left ventricular ejection fraction, by estimation, is 60 to 65%. The left ventricle has normal function. The left ventricle has no regional wall motion abnormalities. The left ventricular internal  cavity size was normal in size. There is  no left ventricular hypertrophy. Left ventricular diastolic parameters were normal. Right Ventricle: The right ventricular size is normal. No increase in right ventricular wall thickness. Right ventricular systolic function is normal. There is normal pulmonary artery systolic pressure. The tricuspid regurgitant velocity is 1.75 m/s, and  with an assumed right atrial pressure of 3 mmHg, the estimated right ventricular systolic pressure is 41.7 mmHg. Left Atrium: Left atrial size was normal in size. Right Atrium: Right atrial size was normal in size. Pericardium: There is no evidence of pericardial effusion. Mitral Valve: The mitral valve is normal in structure. No evidence of mitral valve regurgitation. No evidence of mitral valve stenosis. Tricuspid Valve: The tricuspid valve is normal in structure. Tricuspid valve regurgitation is not demonstrated. No evidence of tricuspid stenosis. Aortic Valve: The aortic valve is tricuspid. Aortic valve regurgitation is not visualized. No aortic stenosis is present. Aortic valve mean gradient measures 5.0 mmHg. Aortic valve peak gradient measures 9.9 mmHg. Aortic valve area, by VTI measures 2.77 cm. Pulmonic Valve: The pulmonic valve was normal in structure. Pulmonic valve regurgitation is not visualized. No evidence of pulmonic stenosis. Aorta: The aortic root is normal in size and structure. IAS/Shunts: No atrial level shunt detected by color flow Doppler.  LEFT VENTRICLE PLAX 2D LVIDd:         5.00 cm      Diastology LVIDs:         3.20 cm      LV e' medial:    10.70 cm/s LV PW:         0.70 cm      LV E/e' medial:  8.1 LV IVS:        0.70 cm      LV e' lateral:   13.10 cm/s LVOT diam:     2.10 cm      LV E/e' lateral: 6.6 LV SV:         86 LV SV Index:   49 LVOT Area:     3.46 cm  LV Volumes (MOD) LV vol d, MOD A2C: 111.0 ml LV vol d, MOD A4C: 121.0 ml LV vol s, MOD A2C: 45.0 ml LV vol s, MOD A4C: 39.7 ml LV SV MOD A2C:     66.0 ml  LV SV MOD A4C:     121.0 ml LV SV MOD BP:      74.9 ml RIGHT VENTRICLE RV Basal diam:  3.10 cm RV Mid diam:    2.50 cm RV S prime:  14.50 cm/s TAPSE (M-mode): 2.7 cm LEFT ATRIUM             Index        RIGHT ATRIUM           Index LA diam:        3.40 cm 1.93 cm/m   RA Area:     10.90 cm LA Vol (A2C):   43.7 ml 24.86 ml/m  RA Volume:   24.50 ml  13.94 ml/m LA Vol (A4C):   40.4 ml 22.99 ml/m LA Biplane Vol: 44.8 ml 25.49 ml/m  AORTIC VALVE                     PULMONIC VALVE AV Area (Vmax):    2.78 cm      PV Vmax:       1.14 m/s AV Area (Vmean):   2.67 cm      PV Peak grad:  5.2 mmHg AV Area (VTI):     2.77 cm AV Vmax:           157.00 cm/s AV Vmean:          105.000 cm/s AV VTI:            0.311 m AV Peak Grad:      9.9 mmHg AV Mean Grad:      5.0 mmHg LVOT Vmax:         126.00 cm/s LVOT Vmean:        81.000 cm/s LVOT VTI:          0.249 m LVOT/AV VTI ratio: 0.80  AORTA Ao Root diam: 3.40 cm MITRAL VALVE               TRICUSPID VALVE MV Area (PHT): 3.99 cm    TR Peak grad:   12.2 mmHg MV Decel Time: 190 msec    TR Vmax:        175.00 cm/s MR Peak grad: 20.8 mmHg MR Vmax:      228.00 cm/s  SHUNTS MV E velocity: 87.00 cm/s  Systemic VTI:  0.25 m MV A velocity: 52.50 cm/s  Systemic Diam: 2.10 cm MV E/A ratio:  1.66 Rudean Haskell MD Electronically signed by Rudean Haskell MD Signature Date/Time: 06/19/2022/3:39:11 PM    Final    DG Abd 1 View  Result Date: 06/19/2022 CLINICAL DATA:  Feeding tube placement EXAM: ABDOMEN - 1 VIEW COMPARISON:  04/16/2022 FINDINGS: A weighted tip feeding tube has been advanced, tip near the pylorus. The stomach is decompressed. Visualized bowel gas pattern unremarkable; lower abdomen excluded. Cholecystectomy clips noted. IMPRESSION: Feeding tube tip near the pylorus. Electronically Signed   By: Lucrezia Europe M.D.   On: 06/19/2022 14:34   DG CHEST PORT 1 VIEW  Result Date: 06/19/2022 CLINICAL DATA:  Chronic alcohol abuse presenting with alcohol withdrawal.  EXAM: PORTABLE CHEST 1 VIEW COMPARISON:  Chest x-ray February 23, 2022 FINDINGS: The cardiomediastinal silhouette is unchanged in contour. Low lung volumes with bronchovascular crowding. No focal pulmonary opacity. No pleural effusion or pneumothorax. Visualized upper abdomen is unremarkable. No acute osseous abnormality. IMPRESSION: No acute cardiopulmonary abnormality. Electronically Signed   By: Beryle Flock M.D.   On: 06/19/2022 11:59

## 2022-06-22 ENCOUNTER — Inpatient Hospital Stay (HOSPITAL_COMMUNITY): Payer: Medicaid Other

## 2022-06-22 DIAGNOSIS — D684 Acquired coagulation factor deficiency: Secondary | ICD-10-CM | POA: Insufficient documentation

## 2022-06-22 DIAGNOSIS — F10931 Alcohol use, unspecified with withdrawal delirium: Secondary | ICD-10-CM | POA: Diagnosis not present

## 2022-06-22 DIAGNOSIS — K7010 Alcoholic hepatitis without ascites: Secondary | ICD-10-CM | POA: Diagnosis not present

## 2022-06-22 DIAGNOSIS — R7989 Other specified abnormal findings of blood chemistry: Secondary | ICD-10-CM | POA: Insufficient documentation

## 2022-06-22 DIAGNOSIS — G9340 Encephalopathy, unspecified: Secondary | ICD-10-CM | POA: Diagnosis not present

## 2022-06-22 DIAGNOSIS — F10939 Alcohol use, unspecified with withdrawal, unspecified: Secondary | ICD-10-CM | POA: Diagnosis present

## 2022-06-22 LAB — TYPE AND SCREEN
ABO/RH(D): O POS
Antibody Screen: NEGATIVE
Unit division: 0

## 2022-06-22 LAB — GLUCOSE, CAPILLARY
Glucose-Capillary: 126 mg/dL — ABNORMAL HIGH (ref 70–99)
Glucose-Capillary: 139 mg/dL — ABNORMAL HIGH (ref 70–99)
Glucose-Capillary: 145 mg/dL — ABNORMAL HIGH (ref 70–99)
Glucose-Capillary: 198 mg/dL — ABNORMAL HIGH (ref 70–99)
Glucose-Capillary: 127 mg/dL — ABNORMAL HIGH (ref 70–99)

## 2022-06-22 LAB — PROTIME-INR
INR: 1.8 — ABNORMAL HIGH (ref 0.8–1.2)
Prothrombin Time: 20.7 seconds — ABNORMAL HIGH (ref 11.4–15.2)

## 2022-06-22 LAB — COMPREHENSIVE METABOLIC PANEL
ALT: 19 U/L (ref 0–44)
AST: 106 U/L — ABNORMAL HIGH (ref 15–41)
Albumin: 3.3 g/dL — ABNORMAL LOW (ref 3.5–5.0)
Alkaline Phosphatase: 130 U/L — ABNORMAL HIGH (ref 38–126)
Anion gap: 6 (ref 5–15)
BUN: 7 mg/dL (ref 6–20)
CO2: 22 mmol/L (ref 22–32)
Calcium: 8.8 mg/dL — ABNORMAL LOW (ref 8.9–10.3)
Chloride: 118 mmol/L — ABNORMAL HIGH (ref 98–111)
Creatinine, Ser: 0.45 mg/dL (ref 0.44–1.00)
GFR, Estimated: >60 mL/min (ref >60)
Glucose, Bld: 131 mg/dL — ABNORMAL HIGH (ref 70–99)
Potassium: 4.2 mmol/L (ref 3.5–5.1)
Sodium: 146 mmol/L — ABNORMAL HIGH (ref 135–145)
Total Bilirubin: 1.9 mg/dL — ABNORMAL HIGH (ref 0.3–1.2)
Total Protein: 7.8 g/dL (ref 6.5–8.1)

## 2022-06-22 LAB — BPAM RBC
Blood Product Expiration Date: 202311122359
ISSUE DATE / TIME: 202310121333
Unit Type and Rh: 5100

## 2022-06-22 LAB — BASIC METABOLIC PANEL
Anion gap: 5 (ref 5–15)
BUN: 7 mg/dL (ref 6–20)
CO2: 22 mmol/L (ref 22–32)
Calcium: 8.4 mg/dL — ABNORMAL LOW (ref 8.9–10.3)
Chloride: 114 mmol/L — ABNORMAL HIGH (ref 98–111)
Creatinine, Ser: 0.45 mg/dL (ref 0.44–1.00)
GFR, Estimated: >60 mL/min (ref >60)
Glucose, Bld: 182 mg/dL — ABNORMAL HIGH (ref 70–99)
Potassium: 3.5 mmol/L (ref 3.5–5.1)
Sodium: 141 mmol/L (ref 135–145)

## 2022-06-22 LAB — MAGNESIUM: Magnesium: 1.8 mg/dL (ref 1.7–2.4)

## 2022-06-22 LAB — PHOSPHORUS: Phosphorus: 1.4 mg/dL — ABNORMAL LOW (ref 2.5–4.6)

## 2022-06-22 LAB — AMMONIA: Ammonia: 127 umol/L — ABNORMAL HIGH (ref 9–35)

## 2022-06-22 MED ORDER — VITAMIN K1 10 MG/ML IJ SOLN
10.0000 mg | Freq: Once | INTRAVENOUS | Status: AC
  Administered 2022-06-22: 10 mg via INTRAVENOUS
  Filled 2022-06-22: qty 1

## 2022-06-22 MED ORDER — SODIUM PHOSPHATES 45 MMOLE/15ML IV SOLN
30.0000 mmol | Freq: Once | INTRAVENOUS | Status: AC
  Administered 2022-06-22: 30 mmol via INTRAVENOUS
  Filled 2022-06-22: qty 10

## 2022-06-22 MED ORDER — MAGNESIUM SULFATE 2 GM/50ML IV SOLN
2.0000 g | Freq: Once | INTRAVENOUS | Status: AC
  Administered 2022-06-22: 2 g via INTRAVENOUS
  Filled 2022-06-22: qty 50

## 2022-06-22 MED ORDER — VITAMIN K1 10 MG/ML IJ SOLN
10.0000 mg | Freq: Once | INTRAVENOUS | Status: AC
  Administered 2022-06-23: 10 mg via INTRAVENOUS
  Filled 2022-06-22: qty 1

## 2022-06-22 MED ORDER — LACTATED RINGERS IV BOLUS
500.0000 mL | Freq: Once | INTRAVENOUS | Status: AC
  Administered 2022-06-22: 500 mL via INTRAVENOUS

## 2022-06-22 MED ORDER — DEXMEDETOMIDINE HCL IN NACL 400 MCG/100ML IV SOLN
0.0000 ug/kg/h | INTRAVENOUS | Status: AC
  Administered 2022-06-22: 1.1 ug/kg/h via INTRAVENOUS
  Administered 2022-06-23: 1 ug/kg/h via INTRAVENOUS
  Administered 2022-06-23: 1.1 ug/kg/h via INTRAVENOUS
  Administered 2022-06-23: 1.2 ug/kg/h via INTRAVENOUS
  Administered 2022-06-23: 1.1 ug/kg/h via INTRAVENOUS
  Administered 2022-06-23: 1 ug/kg/h via INTRAVENOUS
  Administered 2022-06-24: 1.2 ug/kg/h via INTRAVENOUS
  Administered 2022-06-24 (×2): 1 ug/kg/h via INTRAVENOUS
  Administered 2022-06-24: 1.1 ug/kg/h via INTRAVENOUS
  Administered 2022-06-25: 1.4 ug/kg/h via INTRAVENOUS
  Administered 2022-06-25: 1.3 ug/kg/h via INTRAVENOUS
  Administered 2022-06-25: 1.4 ug/kg/h via INTRAVENOUS
  Administered 2022-06-25: 1 ug/kg/h via INTRAVENOUS
  Administered 2022-06-25: 0.8 ug/kg/h via INTRAVENOUS
  Administered 2022-06-25: 1 ug/kg/h via INTRAVENOUS
  Administered 2022-06-26: 1.2 ug/kg/h via INTRAVENOUS
  Administered 2022-06-26: 1 ug/kg/h via INTRAVENOUS
  Administered 2022-06-26: 0.8 ug/kg/h via INTRAVENOUS
  Administered 2022-06-26: 1 ug/kg/h via INTRAVENOUS
  Administered 2022-06-26: 1.2 ug/kg/h via INTRAVENOUS
  Administered 2022-06-26: 0.8 ug/kg/h via INTRAVENOUS
  Administered 2022-06-27: 0.6 ug/kg/h via INTRAVENOUS
  Administered 2022-06-27: 0.4 ug/kg/h via INTRAVENOUS
  Administered 2022-06-27 (×2): 1.2 ug/kg/h via INTRAVENOUS
  Administered 2022-06-28 (×2): 0.6 ug/kg/h via INTRAVENOUS
  Filled 2022-06-22 (×8): qty 100
  Filled 2022-06-22: qty 200
  Filled 2022-06-22 (×10): qty 100
  Filled 2022-06-22: qty 200
  Filled 2022-06-22 (×4): qty 100

## 2022-06-22 NOTE — Progress Notes (Signed)
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 She is non verbal on my exam but moving around and sitting up trying to get out of bed so h/o is obtained from chart review and EDP.   Per report pt had been attempting to detox at home for the past couple days, although etoh level is 158. She was unable to disclose her date of last drink but is now having confusion, weakness and fatigue. It is unclear if she has been compliant with her medications at home as well, however with her ammonia at 150's doubtful.    She is unable to provide any further ros or history.   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 Admitted on prexedex gtt and neo gtt added.  Curerntly off.  On 2LNC . Marland Kitchen Afebrile. Normal WBC. Lactate > 6 this am with very ppor clearance t hough AKI resolved. Platelets down to 28K (baseline 23K- 55k).  LFTs are c/w Acute alcholic hepatitis. Discrimiant functin score 26.7 -> Start solumedrol 06/20/22 - concerns from nursing staff yesterday about  sharing medical information with husband because of red llock in the chart.  Clarification from ICU nursing director this morning from legal team: That in the best interest of patient okay to share medical information with her husband especially in the context of critical illness and decision making. urrently on Neo-Synephrine and Precedex and D5 LR.  Also on antibiotics and steroids.  Encephalopathy continues.  Growing staph and BC ID on Ancef added yesterday but remains afebrile. D2 steroids GI consult  Start tube feeds PICC placed 06/22/22 - INR worse at 2.0., Platelets worse to 20K but bili improved to 2.6 and AST improved to 160s.  /hypothermic. WBC down to 2.8K. -> Bair hugger + No  updated on BCID - still staph.  Off pressors. On 3L [place]. Ativan given overnight. Still on precedex gtt -> on and off agoitation. . D3 steroids. On Tube feeds 1U PROBC for hgb < 7  Interim History / Subjective:    06/22/2022 - d4 steroids. Bili, INR, AST all better. Agitated overnight and Rx  haldo, 5.50am and Ativan x 4.30am. Back on TF. Some bradyardia HR 48 with precedex.  On TF. Admit blood cuolture growing MSSA. Ammoni down to 127 after lactulsoe increased. Afebrile. Ws hypertensive after PRBC -> got hydralazing -> now soft BP  Per ICU nursing director: currently per legal not to call husband  Objective   Blood pressure (!) 110/44, pulse (!) 50, temperature (!) 97.3 F (36.3 C), temperature  source Axillary, resp. rate 19, height 5' 4"  (1.626 m), weight 78.6 kg, SpO2 99 %.        Intake/Output Summary (Last 24 hours) at 06/22/2022 0858 Last data filed at 06/22/2022 0600 Gross per 24 hour  Intake 3577.88 ml  Output 1500 ml  Net 2077.88 ml   Filed Weights   06/19/22 0200 06/21/22 0500 06/22/22 0425  Weight: 70.5 kg 79 kg 78.6 kg   General Appearance:  Looks criticall ill Head:  Normocephalic, without obvious abnormality, atraumatic Eyes:  PERRL - yes, conjunctiva/corneas - muddy     Ears:  Normal  external ear canals, both ears Nose:  G tube - YES Throat:  ETT TUBE - no , OG tube - no Neck:  Supple,  No enlargement/tenderness/nodules Lungs: Clear to auscultation bilaterally, Heart:  S1 and S2 normal, no murmur, CVP - x.  Pressors - no Abdomen:  Soft, no masses, no organomegaly Genitalia / Rectal:  Not done Extremities:  Extremities- intact Skin:  ntact in exposed areas . Sacral area - not examined Neurologic:  Sedation - precedexg tt + ciwa p[rotocol -> RASS - -3.   Resolved Hospital Problem list     Assessment & Plan:   AT risk for intubation due to acute agitated enceophalopathy (CXR clear 06/19/22)  - Present on Admit   06/22/2022 -> currently protecting airway  P:   Intubate if worse O2 for pulse ox goal > 92%    Acute agitated encephalopathy due to DTs/ETOH withdrawal - Present on Admit - Lactulose and xifaxan started 06/19/22 via panda - Precedex gtt since admit  10/13 -fluctating and on precedex gtt and ciwa protocol and haldol prnongoing. . Ammonia improved to 120s  P:   Precredex gtt to continue Ativan CIWA protocol Lactulseo/Xifaxan since 06/19/22 -> increase lactulose 06/21/22 Restrains for safety to continue on 06/21/22 RASS goal o to -2   Circulatory shock at admit due to precedex +/- sepsis  10/13 - OFF neo  but bp spft again. Got hydralazine after BP up after PRBC  P:  MAP goal > 65 Maintenance fluids - give 500c bolus Can restart neo to support precedex gtt   No prior hx of cardiac diease but at risk for etoh cardiomyopathy - echo normal 06/19/22  P: Monitor  CARDIAC ELECTRICAL A: Sinus tachy  P: Tele     MSSA Bacteremia  -Present on Admit  06/20/22- afebrile. Normal PCT. Unclear if contaminant  P:   Ancef 06/19/22 >> ID following   AKI at admit   10/10 -resolved  P:  monitor   Sevre lactic acidosis at admit -   10/11 - resolved. Normmal CK  P: Monitor     Alcoholic cirrhosis with portal hypertension  and eso varices  but wihtout ascites- Prior to & Present on Admit Acute alcoholic intoxication- Present on Admit Acute alcoholic hepatitis - Present on Admit (disc score 26.7); normal tylenol land sal evel at admit. STArted solumedrol 06/20/22  06/22/22 - Bili, INR  and platelelet improved.  MELD 15 (18 on 06/21/22)  P:   Continue steroids D4 prednisolone  - track Lille score on day 7 at 06/26/22 Lactulose continue Continue xifaxan Thiamine/Folic    Chronic anemia - baseline hgb 7.5 - severe - Prior to & Present on Admit  - s/P PRBC 06/21/22 for hgb < 10m of criticial ilness  10/13 - no active bleed  P:  - PRBC for hgb </= 6.9gm%  (will not transfuse 06/21/22)  - exceptions are   -  if ACS susepcted/confirmed then transfuse for hgb </= 8.0gm%,  or    -  active bleeding with hemodynamic instability, then transfuse regardless of hemoglobin value   At at all times try to transfuse 1 unit prbc as possible with exception of active hemorrhage    Moderate - severe thrombocytopenia at baseline (baseline 23-76) - likey due to cirrhos s- Prior to & Present on Admit   10/132- plat 20'sk and improved  P Monitor No heparin/lovenoix/AC Platelet if bleeding or <= 10K and for procedures   Electrolyte abnormalities Hypo-phsatemia severe 10/12 and again 06/22/22 Hypomagenseemia - mod 10/11 pm and again 06/22/22 am   10/13 -repleted by [facility]  - replete as needed and monitor  At risk for hypoglycemia   P:   monitor  MSK/DERM Baseline moderate to severe PCM - alb 2.8 in [place] - monitpr - continue  TF since 06/20/22  Best Practice (right click and "Reselect all SmartList Selections" daily)  PICC line -since 06/20/22 Diet/type: Start TF 06/20/22 DVT prophylaxis: SCD GI prophylaxis: PPI Lines: N/A Foley:  N/A - purewick as of 06/21/22 Code Status:  full code  Last date of multidisciplinary goals of care discussion    Joann [name]  [phone]. He  says he called the ER 2h ago 06/19/2022 and was informed "patient is not here". He is upset that ER did not inform him about whereabouts. He says that no one called him yesterday but for admissions nurse last night. He says he was not aware patient is in ICU.  He is upset about this.  He then wanted me to call her Derek Jack mother [phone]-> no answer   - called husband 06/20/22 - kept ringing and LMTCB -> 10:02 AM he called back and gave him update. Explained about combo of cirrhosis and acute alcoholic hepatitis and encephalopathy. Explained that if liver continues to fail, mortality is to be expected.   -> he asked if I did put her on "red lock" -> I told him I did NOT put the red lock. He did say that nursing director Nelva Bush did call  him and explained that we will share medical infor with him. He said he is very upset and he is the voice for her. He said if she said it in the ER and they put red lock they should not have because "she had delirium". He was on the road as truck driver and had EMS called  -> later daughter Lynett Fish (aged 45) came to hospital but was turned awa. She called the doc line and told her we are trying to sort out redlock situation and will let her know -> [phone]   06/21/22 - called husband to update. LMTCB . MELD 18 and improving bili but setbacks are hypothermia, worsening paltelets  06/22/22 - advised by compliance via nursing director not to call husband or family. She is improving    ATTESTATION & SIGNATURE   The patient Joann [name] is critically ill with multiple organ systems failure and requires high complexity decision making for assessment and support, frequent evaluation and titration of therapies, application of advanced monitoring technologies and extensive interpretation of multiple databases.   Critical Care Time devoted to patient care services described in this note is  35  Minutes. This time reflects time of care of this signee Dr  Brand Males. This critical care time does not reflect procedure time, or teaching time or supervisory  time of PA/NP/Med student/Med Resident etc but could involve care discussion time     Dr. Brand Males, M.D., [facility].C.P Pulmonary and Critical Care Medicine Medical Director - [facility] ICU Staff Physician, [place] [facility] Pager: [phone], If no answer or between  15:00h - 7:00h: call [phone]  06/22/2022 9:42 AM     LABS      PULMONARY No results for input(s): "PHART", "PCO2ART", "PO2ART", "HCO3", "TCO2", "O2SAT" in the last 168 hours.  Invalid input(s): "PCO2", "PO2"  CBC Recent Labs  Lab 06/20/22 0307 06/21/22 0213 06/21/22 1139 06/21/22 1818 06/22/22 0420  HGB 7.2* 7.0* 6.8* 7.5* 8.4*  HCT 25.6* 26.1* 25.5* 26.9* 30.4*  WBC 3.4* 2.8*  --   --  4.4  PLT 23* 20*  --   --  26*    COAGULATION Recent Labs  Lab 06/18/22 2230 06/21/22 0213 06/22/22 0420  INR 1.7* 2.0* 1.8*    CARDIAC  No results for input(s): "TROPONINI" in the last 168 hours. No results for input(s): "PROBNP" in the last 168 hours.   CHEMISTRY Recent Labs  Lab 06/18/22 1929 06/19/22 0258 06/20/22 0307 06/20/22 2114 06/21/22 0213 06/21/22 1708 06/22/22 0420  NA 139   < > 143 141 139 142 146*  K 2.5*   < > 3.0* 3.9 3.2* 4.1 4.2  CL 98   < > 115* 117* 116* 118* 118*  CO2 19*   < > 20* 20* 20* 19* 22  GLUCOSE 131*   < > 122* 119* 181* 295* 131*  BUN 13   < > 7 7 8 7 7   CREATININE 1.41*   < > 0.62 0.57 0.58 0.39* 0.45  CALCIUM 8.3*   < > 7.6* 7.8* 7.8* 7.9* 8.8*  MG 2.0  --   --  1.6* 2.9*  --  1.8  PHOS  --   --   --   --  1.3*  --  1.4*   < > = values in this interval not displayed.   Estimated Creatinine Clearance: 92 mL/min (by C-G formula based on SCr of 0.45 mg/dL).   LIVER Recent Labs  Lab 06/18/22 1929 06/18/22 2230 06/19/22 1257 06/20/22 0307 06/21/22 0213 06/22/22 0420  AST 275*  --  198* 190*  163* 106*  ALT 36  --  25 26 24 19   ALKPHOS 195*  --  143* 148* 143* 130*  BILITOT 3.7*  --  3.9* 4.0* 2.6* 1.9*  PROT 10.5*  --  7.4 7.7 7.5 7.8  ALBUMIN 4.0  --  2.9* 2.9* 2.7* 3.3*  INR  --  1.7*  --   --  2.0* 1.8*     INFECTIOUS Recent Labs  Lab 06/19/22 1230 06/19/22 1257 06/20/22 0307 06/21/22 0213  LATICACIDVEN 2.1*  --  1.4 1.2  PROCALCITON  --  <0.10  --   --      ENDOCRINE CBG (last 3)  Recent Labs    06/21/22 2337 06/22/22 0428 06/22/22 0821  GLUCAP 129* 126* 139*         IMAGING x48h  - image(s) personally visualized  -   highlighted in bold No results found.

## 2022-06-22 NOTE — Clinical Note (Incomplete)
This nurse removed and gave the patient's ring located on her left hand

## 2022-06-22 NOTE — Progress Notes (Signed)
eLink Physician-Brief Progress Note Patient Name: Joann [name] DOB: [birth year] MRN: [id number]   Date of Service  06/22/2022  HPI/Events of Note  Patient with severe agitated ETOH withdrawal delirium, kicked the bedside  RN.  eICU Interventions  Precedex gtt ceiling increased to 1.5 mcg and bilateral ankle restraints added.        Kerry Kass Anshul Meddings 06/22/2022, 10:52 PM

## 2022-06-22 NOTE — Progress Notes (Incomplete)
E-Link notified due to restraints expiring at 0750. Per MD, would like to wait for day time attending to evaluate face-to-face before renewing restraint order.

## 2022-06-22 NOTE — Plan of Care (Signed)

## 2022-06-22 NOTE — Progress Notes (Signed)
[facility] Progress Note  CC:  Alcohol associated hepatitis/cirrhosis      Subjective: She is sedated on Precedex, received Haldol at 5:49 AM and Lorazepam at 4:30 AM.  Tube feedings were held yesterday due to significant agitation at risk for aspiration.  Tube feedings restarted yesterday last night.  No excessive or bloody stools reported by the nursing staff.  No family at the bedside.   Objective:  Vital signs in last 24 hours: Temp:  [97.2 F (36.2 C)-98.8 F (37.1 C)] 97.3 F (36.3 C) (10/13 0700) Pulse Rate:  [46-120] 50 (10/13 0700) Resp:  [17-26] 19 (10/13 0700) BP: (87-180)/(59-102) 160/79 (10/13 0700) SpO2:  [91 %-100 %] 100 % (10/13 0700) Weight:  [78.6 kg] 78.6 kg (10/13 0425) Last BM Date : 06/21/22 General: Critically ill 41 year old female. Heart: Regular rate and rhythm, no murmurs. Pulm: Breath sounds clear, decreased in the bases. Abdomen:  Soft, nondistended.  No obvious tenderness.  No rebound or guarding.  No palpable mass.  No hepatosplenomegaly.  No ascites.  NG tube with tube feedings intact. Hypoactive bowel sounds to all 4 quadrants.  No abdominal bruit. Extremities:  Without edema. Neurologic: Unresponsive, sedated on Precedex, Haldol and Lorazepam.   Intake/Output from previous day: 10/12 0701 - 10/13 0700 In: 3782.1 [I.V.:1559.9; Blood:466; NG/GT:763.5; IV Piggyback:992.7] Out: 1500 [Urine:1500] Intake/Output this shift: No intake/output data recorded.  Lab Results: Recent Labs    06/20/22 0307 06/21/22 0213 06/21/22 1139 06/21/22 1818 06/22/22 0420  WBC 3.4* 2.8*  --   --  4.4  HGB 7.2* 7.0* 6.8* 7.5* 8.4*  HCT 25.6* 26.1* 25.5* 26.9* 30.4*  PLT 23* 20*  --   --  26*   BMET Recent Labs    06/21/22 0213 06/21/22 1708 06/22/22 0420  NA 139 142 146*  K 3.2* 4.1 4.2  CL 116* 118* 118*  CO2 20* 19* 22  GLUCOSE 181* 295* 131*  BUN 8 7 7   CREATININE 0.58 0.39* 0.45  CALCIUM 7.8* 7.9* 8.8*   LFT Recent Labs     06/22/22 0420  PROT 7.8  ALBUMIN 3.3*  AST 106*  ALT 19  ALKPHOS 130*  BILITOT 1.9*   PT/INR Recent Labs    06/21/22 0213 06/22/22 0420  LABPROT 22.3* 20.7*  INR 2.0* 1.8*   Hepatitis Panel No results for input(s): "HEPBSAG", "HCVAB", "HEPAIGM", "HEPBIGM" in the last 72 hours.  No results found.  Assessment / Plan:  41 year old female with alcohol associated cirrhosis (s/p liver bx 06/2019 consistent with alcoholic hepatitis, F3 fibrosis, without histologic evidence of autoimmune liver disease), portal HTN, esophageal varices and hepatic encephalopathy. Numerous hospital admissions for acute alcohol associated hepatitis, unable to achieve long-term abstinence from alcohol. Not a liver transplant candidate. Re-admitted to the hospital 06/18/2022 with severe alcohol associated hepatitis, alcohol withdrawal and hepatic encephalopathy. Admission MDF 35.4.  Received Solumedrol 64m IV on 10/10. Started on Prednisolone 10/11. Total bili 4.0 -> 2.6 -> 1.9.  AST 163 -> 106.  ALT 24 -> 19.  Alkaline phos 43.  Albumin 2.7. Ammonia 176 -> 127. Normal renal function.  Hep B surface antigen nonreactive and hepatitis C antibody nonreactive 02/20/2022. RUQ sono 06/19/2022 consistent with cirrhosis without hepatoma and suboptimal evaluation of the portal vein.  Significant agitation, on Precedex infusion, Ativan and Haldol PRN. -Continue Xifaxan bid and Lactulose 30gm per NGT qid, titrate to no more than 3 - 4 loose stools daily.  Rectal pouch placed, monitor volume of stool output  -  Prednisolone 40 mg p.o. daily, check Lille score day on 06/26/2022 -CIWA protocol in process -Continue folate and thiamine -Monitor neuro and respiratory status closely -Continue Pantoprazole 40 mg twice daily per NG tube -Tube feedings -Recommend inpatient alcohol rehab -Await further recommendations per Dr. Rush Landmark   Acute on chronic anemia. Hg 7.2 -> 7.0 -> 6.8 transfused 1 unit of PRBCs -> Hg 7.5 -> today Hg.  No overt GI bleeding.  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  INR 1.7 -> 2.0 -> 1.8.   Severe thrombocytopenia. PLT 26.    Blood culture + for Staph aureus. On Cefazolin IV.  -ID following    Principal Problem:   Encephalopathy Active Problems:   Altered mental status   Acute drug withdrawal syndrome ([place])     LOS: 4 days   Noralyn Pick  06/22/2022, 8:55AM

## 2022-06-22 NOTE — Progress Notes (Signed)
[facility] ADULT ICU REPLACEMENT PROTOCOL   The patient does apply for the [facility] Adult ICU Electrolyte Replacment Protocol based on the criteria listed below:   1.Exclusion criteria: TCTS patients, ECMO patients, and Dialysis patients 2. Is GFR >/= 30 ml/min? Yes.    Patient's GFR today is >60 3. Is SCr </= 2? Yes.   Patient's SCr is 0.45 mg/dL 4. Did SCr increase >/= 0.5 in 24 hours? No. 5.Pt's weight >40kg  Yes.   6. Abnormal electrolyte(s): mag 1.8, phos 1.4  7. Electrolytes replaced per protocol 8.  Call MD STAT for K+ </= 2.5, Phos </= 1, or Mag </= 1 Physician:  n/a  Joann [name] 06/22/2022 5:47 AM

## 2022-06-22 NOTE — Progress Notes (Signed)
[facility] for Infectious Disease   Reason for visit: follow up on positive blood culture  Interval History: no acute events  Day 3 antibiotics  Physical Exam: Constitutional:  Vitals:   06/22/22 1200 06/22/22 1345  BP: (!) 150/69 (!) 157/85  Pulse: 83 73  Resp: 19   Temp:    SpO2: 100%   Respiratory: normal respiratory effort   Review of Systems: Constitutional: unobtainable  Lab Results  Component Value Date   WBC 4.4 06/22/2022   HGB 8.4 (L) 06/22/2022   HCT 30.4 (L) 06/22/2022   MCV 84.4 06/22/2022   PLT 26 (LL) 06/22/2022    Lab Results  Component Value Date   CREATININE 0.45 06/22/2022   BUN 7 06/22/2022   NA 146 (H) 06/22/2022   K 4.2 06/22/2022   CL 118 (H) 06/22/2022   CO2 22 06/22/2022    Lab Results  Component Value Date   ALT 19 06/22/2022   AST 106 (H) 06/22/2022   ALKPHOS 130 (H) 06/22/2022     Microbiology: Recent Results (from the past 240 hour(s))  Blood culture (routine x 2)     Status: None (Preliminary result)   Collection Time: 06/18/22  8:40 PM   Specimen: BLOOD  Result Value Ref Range Status   Specimen Description   Final    BLOOD BLOOD LEFT ARM Performed at [facility], [place] [address] [place], [place] [address]    Special Requests   Final    BOTTLES DRAWN AEROBIC AND ANAEROBIC Blood Culture results may not be optimal due to an inadequate volume of blood received in culture bottles Performed at [facility] [address] [place], [place] [address]    Culture   Final    NO GROWTH 2 DAYS Performed at [facility] [address] [place], [place] [address]    Report Status PENDING  Incomplete  Blood culture (routine x 2)     Status: Abnormal   Collection Time: 06/18/22 10:34 PM   Specimen: BLOOD  Result Value Ref Range Status   Specimen Description   Final    BLOOD RIGHT ANTECUBITAL Performed at [facility], [place] [address], [place], [place] [address]     Special Requests   Final    Blood Culture adequate volume BOTTLES DRAWN AEROBIC AND ANAEROBIC Performed at [facility] [address], [place], [place] [address]    Culture  Setup Time   Final    GRAM POSITIVE COCCI IN PAIRS BOTTLES DRAWN AEROBIC ONLY CRITICAL RESULT CALLED TO, READ BACK BY AND VERIFIED WITH: PHARMD NICK GLOGOVAC ON 06/19/22 @ 1734 BY DRT   Performed at [facility], [place] [address], [place], [place] [address]    Culture STAPHYLOCOCCUS AUREUS (A)  Final   Report Status 06/21/2022 FINAL  Final   Organism ID, Bacteria STAPHYLOCOCCUS AUREUS  Final      Susceptibility   Staphylococcus aureus - MIC*    CIPROFLOXACIN <=0.5 SENSITIVE Sensitive     ERYTHROMYCIN <=0.25 SENSITIVE Sensitive     GENTAMICIN <=0.5 SENSITIVE Sensitive     OXACILLIN <=0.25 SENSITIVE Sensitive     TETRACYCLINE <=1 SENSITIVE Sensitive     VANCOMYCIN 1 SENSITIVE Sensitive     TRIMETH/SULFA <=10 SENSITIVE Sensitive     CLINDAMYCIN <=0.25 SENSITIVE Sensitive     RIFAMPIN <=0.5 SENSITIVE Sensitive     Inducible Clindamycin NEGATIVE Sensitive     * STAPHYLOCOCCUS AUREUS  Blood  Culture ID Panel (Reflexed)     Status: Abnormal   Collection Time: 06/18/22 10:34 PM  Result Value Ref Range Status   Enterococcus faecalis NOT DETECTED NOT DETECTED Final   Enterococcus Faecium NOT DETECTED NOT DETECTED Final   Listeria monocytogenes NOT DETECTED NOT DETECTED Final   Staphylococcus species DETECTED (A) NOT DETECTED Final    Comment: CRITICAL RESULT CALLED TO, READ BACK BY AND VERIFIED WITH: PHARMD NICK GLOGOVAC ON 06/19/22 @ 1734 BY DRT      Staphylococcus aureus (BCID) DETECTED (A) NOT DETECTED Final    Comment: CRITICAL RESULT CALLED TO, READ BACK BY AND VERIFIED WITH: PHARMD NICK GLOGOVAC ON 06/19/22 @ 1734 BY DRT      Staphylococcus epidermidis NOT DETECTED NOT DETECTED Final   Staphylococcus lugdunensis NOT DETECTED NOT DETECTED Final   Streptococcus species NOT DETECTED NOT  DETECTED Final   Streptococcus agalactiae NOT DETECTED NOT DETECTED Final   Streptococcus pneumoniae NOT DETECTED NOT DETECTED Final   Streptococcus pyogenes NOT DETECTED NOT DETECTED Final   A.calcoaceticus-baumannii NOT DETECTED NOT DETECTED Final   Bacteroides fragilis NOT DETECTED NOT DETECTED Final   Enterobacterales NOT DETECTED NOT DETECTED Final   Enterobacter cloacae complex NOT DETECTED NOT DETECTED Final   Escherichia coli NOT DETECTED NOT DETECTED Final   Klebsiella aerogenes NOT DETECTED NOT DETECTED Final   Klebsiella oxytoca NOT DETECTED NOT DETECTED Final   Klebsiella pneumoniae NOT DETECTED NOT DETECTED Final   Proteus species NOT DETECTED NOT DETECTED Final   Salmonella species NOT DETECTED NOT DETECTED Final   Serratia marcescens NOT DETECTED NOT DETECTED Final   Haemophilus influenzae NOT DETECTED NOT DETECTED Final   Neisseria meningitidis NOT DETECTED NOT DETECTED Final   Pseudomonas aeruginosa NOT DETECTED NOT DETECTED Final   Stenotrophomonas maltophilia NOT DETECTED NOT DETECTED Final   Candida albicans NOT DETECTED NOT DETECTED Final   Candida auris NOT DETECTED NOT DETECTED Final   Candida glabrata NOT DETECTED NOT DETECTED Final   Candida krusei NOT DETECTED NOT DETECTED Final   Candida parapsilosis NOT DETECTED NOT DETECTED Final   Candida tropicalis NOT DETECTED NOT DETECTED Final   Cryptococcus neoformans/gattii NOT DETECTED NOT DETECTED Final   Meth resistant mecA/C and MREJ NOT DETECTED NOT DETECTED Final    Comment: Performed at [facility], [address] [place], [place] [address]  MRSA Next Gen by PCR, Nasal     Status: None   Collection Time: 06/19/22  4:02 AM   Specimen: Nasal Mucosa; Nasal Swab  Result Value Ref Range Status   MRSA by PCR Next Gen NOT DETECTED NOT DETECTED Final    Comment: (NOTE) The GeneXpert MRSA Assay (FDA approved for NASAL specimens only), is one component of a comprehensive MRSA colonization  surveillance program. It is not intended to diagnose MRSA infection nor to guide or monitor treatment for MRSA infections. Test performance is not FDA approved in patients less than 73 years old. Performed at [facility], [place] [address], [place], [place] [address]   Culture, blood (Routine X 2) w Reflex to ID Panel     Status: None (Preliminary result)   Collection Time: 06/21/22  6:44 AM   Specimen: BLOOD LEFT HAND  Result Value Ref Range Status   Specimen Description   Final    BLOOD LEFT HAND Performed at [facility] [address] [place], [place] [address]    Special Requests   Final    BOTTLES DRAWN AEROBIC AND ANAEROBIC Blood  Culture adequate volume Performed at [facility] [address] [place], [place] [address]    Culture   Final    NO GROWTH < 12 HOURS Performed at [facility] [address], [place], [place] [address]    Report Status PENDING  Incomplete  Culture, blood (Routine X 2) w Reflex to ID Panel     Status: None (Preliminary result)   Collection Time: 06/21/22  6:53 AM   Specimen: BLOOD RIGHT HAND  Result Value Ref Range Status   Specimen Description   Final    BLOOD RIGHT HAND Performed at [facility] [address] [place], [place] [address]    Special Requests   Final    BOTTLES DRAWN AEROBIC AND ANAEROBIC Blood Culture adequate volume Performed at [facility] [address], [place], [place] [address]    Culture   Final    NO GROWTH < 12 HOURS Performed at [facility] [address] [place], [place] [address]    Report Status PENDING  Incomplete    Impression/Plan:  1. Positive blood culture - 1 bottle positive for Staph aureus.  Other set negative.  She really has had no concerns for an active infection with no fever, no leukocytosis.  She did require some pressor support secondary to her DTs.   She will continue with  cefazolin.  No concerns on TTE. Remaining blood cultures remain negative.  Will continue to monitor.  Plan for 2 weeks antibiotics. Will follow up again on Monday  2.  Access - picc line in place and placed before clearance.  OK to continue and will be removed at discharge.

## 2022-06-22 NOTE — Progress Notes (Incomplete)
Patient was not involved in bath and appeared to be deeply sedated, but as soon as nurse leaves the room the patient begins thrashing around in the bed and screaming. Patient will not stay upright even though she is receiving meds and continuous tube feedings. Tube feeds and oral meds were paused for

## 2022-06-23 DIAGNOSIS — K7011 Alcoholic hepatitis with ascites: Secondary | ICD-10-CM

## 2022-06-23 DIAGNOSIS — G9340 Encephalopathy, unspecified: Secondary | ICD-10-CM | POA: Diagnosis not present

## 2022-06-23 DIAGNOSIS — R410 Disorientation, unspecified: Secondary | ICD-10-CM

## 2022-06-23 DIAGNOSIS — K7031 Alcoholic cirrhosis of liver with ascites: Secondary | ICD-10-CM | POA: Diagnosis not present

## 2022-06-23 DIAGNOSIS — F10931 Alcohol use, unspecified with withdrawal delirium: Secondary | ICD-10-CM | POA: Diagnosis not present

## 2022-06-23 DIAGNOSIS — F19931 Other psychoactive substance use, unspecified with withdrawal delirium: Secondary | ICD-10-CM | POA: Diagnosis not present

## 2022-06-23 LAB — CBC
HCT: 30.4 % — ABNORMAL LOW (ref 36.0–46.0)
HCT: 31.9 % — ABNORMAL LOW (ref 36.0–46.0)
Hemoglobin: 8.4 g/dL — ABNORMAL LOW (ref 12.0–15.0)
Hemoglobin: 9.1 g/dL — ABNORMAL LOW (ref 12.0–15.0)
MCH: 23.3 pg — ABNORMAL LOW (ref 26.0–34.0)
MCH: 23.6 pg — ABNORMAL LOW (ref 26.0–34.0)
MCHC: 27.6 g/dL — ABNORMAL LOW (ref 30.0–36.0)
MCHC: 28.5 g/dL — ABNORMAL LOW (ref 30.0–36.0)
MCV: 84.4 fL (ref 80.0–100.0)
MCV: 82.6 fL (ref 80.0–100.0)
Platelets: 26 10*3/uL — CL (ref 150–400)
Platelets: 27 10*3/uL — CL (ref 150–400)
RBC: 3.6 MIL/uL — ABNORMAL LOW (ref 3.87–5.11)
RBC: 3.86 MIL/uL — ABNORMAL LOW (ref 3.87–5.11)
RDW: 27.4 % — ABNORMAL HIGH (ref 11.5–15.5)
RDW: 28 % — ABNORMAL HIGH (ref 11.5–15.5)
WBC: 4.4 10*3/uL (ref 4.0–10.5)
WBC: 4.3 10*3/uL (ref 4.0–10.5)
nRBC: 0.7 % — ABNORMAL HIGH (ref 0.0–0.2)
nRBC: 0.7 % — ABNORMAL HIGH (ref 0.0–0.2)

## 2022-06-23 LAB — BASIC METABOLIC PANEL
Anion gap: 6 (ref 5–15)
BUN: 7 mg/dL (ref 6–20)
CO2: 24 mmol/L (ref 22–32)
Calcium: 8.7 mg/dL — ABNORMAL LOW (ref 8.9–10.3)
Chloride: 111 mmol/L (ref 98–111)
Creatinine, Ser: 0.33 mg/dL — ABNORMAL LOW (ref 0.44–1.00)
GFR, Estimated: >60 mL/min (ref >60)
Glucose, Bld: 146 mg/dL — ABNORMAL HIGH (ref 70–99)
Potassium: 3.4 mmol/L — ABNORMAL LOW (ref 3.5–5.1)
Sodium: 141 mmol/L (ref 135–145)

## 2022-06-23 LAB — MAGNESIUM: Magnesium: 1.7 mg/dL (ref 1.7–2.4)

## 2022-06-23 LAB — PROTIME-INR
INR: 1.6 — ABNORMAL HIGH (ref 0.8–1.2)
Prothrombin Time: 19 seconds — ABNORMAL HIGH (ref 11.4–15.2)

## 2022-06-23 LAB — GLUCOSE, CAPILLARY
Glucose-Capillary: 139 mg/dL — ABNORMAL HIGH (ref 70–99)
Glucose-Capillary: 142 mg/dL — ABNORMAL HIGH (ref 70–99)
Glucose-Capillary: 166 mg/dL — ABNORMAL HIGH (ref 70–99)
Glucose-Capillary: 167 mg/dL — ABNORMAL HIGH (ref 70–99)
Glucose-Capillary: 201 mg/dL — ABNORMAL HIGH (ref 70–99)

## 2022-06-23 LAB — AMMONIA: Ammonia: 148 umol/L — ABNORMAL HIGH (ref 9–35)

## 2022-06-23 LAB — PHOSPHORUS: Phosphorus: 2.2 mg/dL — ABNORMAL LOW (ref 2.5–4.6)

## 2022-06-23 MED ORDER — PHENOBARBITAL SODIUM 65 MG/ML IJ SOLN: 32.5000 mg | Freq: Three times a day (TID) | INTRAMUSCULAR | Status: DC

## 2022-06-23 MED ORDER — PHENOBARBITAL SODIUM 65 MG/ML IJ SOLN
65.0000 mg | Freq: Three times a day (TID) | INTRAMUSCULAR | Status: AC
  Administered 2022-06-23 – 2022-06-25 (×6): 65 mg via INTRAVENOUS
  Filled 2022-06-23 (×6): qty 1

## 2022-06-23 MED ORDER — POTASSIUM PHOSPHATES 15 MMOLE/5ML IV SOLN
24.0000 mmol | Freq: Once | INTRAVENOUS | Status: AC
  Administered 2022-06-23: 24 mmol via INTRAVENOUS
  Filled 2022-06-23: qty 8

## 2022-06-23 MED ORDER — LORAZEPAM 2 MG/ML IJ SOLN
1.0000 mg | INTRAMUSCULAR | Status: DC | PRN
  Administered 2022-06-28 – 2022-06-29 (×2): 1 mg via INTRAVENOUS
  Filled 2022-06-23 (×2): qty 1

## 2022-06-23 MED ORDER — MAGNESIUM SULFATE 2 GM/50ML IV SOLN
2.0000 g | Freq: Once | INTRAVENOUS | Status: AC
  Administered 2022-06-23: 2 g via INTRAVENOUS
  Filled 2022-06-23: qty 50

## 2022-06-23 NOTE — Progress Notes (Signed)
[facility] Progress Note Patient Name: Joann [name] DOB: Nov 26, 1980 MRN: [id number]   Date of Service  06/23/2022  HPI/Events of Note  Patient's ammonia level is 148 (up from 127), patient is already on Lactulose 30 gm  QID, platelet count 27 (down from 28).  eICU Interventions  Continue to trend ammonia level and platelets.        Kerry Kass Travin Marik 06/23/2022, 6:36 AM

## 2022-06-23 NOTE — Progress Notes (Signed)
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 She is non verbal on my exam but moving around and sitting up trying to get out of bed so h/o is obtained from chart review and EDP.   Per report pt had been attempting to detox at home for the past couple days, although etoh level is 158. She was unable to disclose her date of last drink but is now having confusion, weakness and fatigue. It is unclear if she has been compliant with her medications at home as well, however with her ammonia at 150's doubtful.    She is unable to provide any further ros or history.   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 Admitted on prexedex gtt and neo gtt added.  Curerntly off.  On 2LNC . Marland Kitchen Afebrile. Normal WBC. Lactate > 6 this am with very ppor clearance t hough AKI resolved. Platelets down to 28K (baseline 23K- 55k).  LFTs are c/w Acute alcholic hepatitis. Discrimiant functin score 26.7 -> Start solumedrol 06/20/22 - concerns from nursing staff yesterday about  sharing medical information with husband because of red llock in the chart.  Clarification from ICU nursing director this morning from legal team: That in the best interest of patient okay to share medical information with her husband especially in the context of critical illness and decision making. urrently on Neo-Synephrine and Precedex and D5 LR.  Also on antibiotics and steroids.  Encephalopathy continues.  Growing staph and BC ID on Ancef added yesterday but remains afebrile. D2 steroids GI consult  Start tube feeds PICC placed 06/21/22 - INR worse at 2.0., Platelets worse to 20K but bili improved to 2.6 and AST improved to 160s.  /hypothermic. WBC down to 2.8K. -> Bair hugger + No  updated on BCID - still staph.  Off pressors. On 3L [place]. Ativan given overnight. Still on precedex gtt -> on and off agoitation. . D3 steroids. On Tube feeds 1U PROBC for hgb < 7 06/22/2022:  d4 steroids. Bili, INR, AST all better. Agitated overnight and Rx  haldo, 5.50am and Ativan x 4.30am. Back on TF. Some bradyardia HR 48 with precedex.  On TF. Admit blood cuolture growing MSSA. Ammoni down to 127 after lactulsoe increased. Afebrile. Ws hypertensive after PRBC -> got hydralazing -> now soft BP Per ICU nursing director: currently per legal not to call husband  Interim History / Subjective:    06/23/2022 -day 5 steroid.  Severe agitation last night and acute nursing.  Precedex feeling increased.  Ankle restraints  applied.  Remains on 2 L nasal cannula.  Remains on Precedex.  Remains afebrile with normal white count.  Is on cefazolin lactulose and Xifaxan.  Significance for low magnesium low phosphorus and low potassium today.  Liver function [platelet and INR ) improving.   Objective   Blood pressure 95/66, pulse (!) 58, temperature 98.1 F (36.7 C), temperature source Axillary, resp. rate 20, height 5' 4"  (1.626 m), weight 78.5 kg, SpO2 100 %.    FiO2 (%):  [2 %] 2 %   Intake/Output Summary (Last 24 hours)  at 06/23/2022 1101 Last data filed at 06/23/2022 0646 Gross per 24 hour  Intake 2650.04 ml  Output 750 ml  Net 1900.04 ml   Filed Weights   06/21/22 0500 06/22/22 0425 06/23/22 0500  Weight: 79 kg 78.6 kg 78.5 kg   General Appearance:  Looks criticall ill Head:  Normocephalic, without obvious abnormality, atraumatic Eyes:  PERRL - yes, conjunctiva/corneas - muddy v jaundice     Ears:  Normal external ear canals, both ears Nose:  G tube - YES Throat:  ETT TUBE - no , OG tube - no Neck:  Supple,  No enlargement/tenderness/nodules Lungs: Clear to auscultation bilaterally,  Heart:  S1 and S2 normal, no murmur, CVP - no.  Pressors - no Abdomen:  Soft, no masses, no organomegaly Genitalia / Rectal:  Not done Extremities:  Extremities- intact Skin:  ntact in exposed areas . Sacral area - not examined Neurologic:  Sedation - precedex gtt -> RASS - -3 right now . Moves all 4s - yes. CAM-ICU - Positive . Orientation - NOT     Resolved Hospital Problem list     Assessment & Plan:   AT risk for intubation due to acute agitated enceophalopathy (CXR clear 06/19/22)  - Present on Admit   06/23/2022 -> currently protecting airway  P:   Intubate if worse O2 for pulse ox goal > 92%    Acute agitated encephalopathy due to DTs/ETOH withdrawal - Present on Admit - Lactulose and xifaxan started 06/19/22 via panda - Precedex gtt since admit  10/143 -fluctating and on precedex gtt and ciwa protocol and haldol prnongoing.    P:   Precredex gtt to continue Ativan CIWA protocol Lactulseo/Xifaxan since 06/19/22 -> increased lactulose 06/21/22 Restrains for safety to continue on 06/23/22 Might need phenobarb protocol ( RASS goal o to -2   Circulatory shock at admit due to precedex +/- sepsis  10/14 - OFF neo   P:  MAP goal > 65 Monitor   No prior hx of cardiac diease but at risk for etoh cardiomyopathy - echo normal 06/19/22  P: Monitor  CARDIAC ELECTRICAL A: Sinus  tachy  P: Tele     MSSA Bacteremia x  1 -Present on Admit  06/20/22- afebrile. Normal PCT. Unclear if contaminant  P:   Ancef 06/19/22 >> ID following   AKI at admit   10/14-resolved as of 06/19/22  P:  monitor   Sevre lactic acidosis at admit -   10/14 - resolved as of 06/20/22. Normmal CK  P: Monitor     Alcoholic cirrhosis with portal hypertension and eso varices  but wihtout ascites- Prior to & Present on Admit Acute alcoholic intoxication- Present on Admit Acute alcoholic hepatitis - Present on Admit (disc score 26.7); normal tylenol land sal evel at admit. STArted solumedrol 06/20/22  06/23/22 - Bili, INR  and platelelet improving.  MELD 18 on 06/21/22 -> 15 on 06/22/22)  P:   Continue  steroids D5 prednisolone  - track Lille score on day 7 at 06/26/22 Lactulose continue Continue xifaxan Thiamine/Folic    Chronic anemia - baseline hgb 7.5 - severe - Prior to & Present on Admit  - s/P PRBC 06/21/22 for hgb < 40m of criticial ilness  10/14 - no active bleed  P:  - PRBC for hgb </= 6.9gm%  (will not transfuse 06/21/22)  - exceptions are   -  if ACS susepcted/confirmed then transfuse for hgb </= 8.0gm%,  or    -  active bleeding with hemodynamic instability, then transfuse regardless of hemoglobin value   At at all times try to transfuse 1 unit prbc as possible with exception of active hemorrhage    Moderate - severe thrombocytopenia at baseline (baseline 23-76) - likey due to cirrhos s- Prior to & Present on Admit   10/14- plat 20'sk but improviong   P Monitor No heparin/lovenoix/AC Platelet if bleeding or <= 10K and for procedures   Electrolyte abnormalities Hypo-phsatemia severe 10/12 and again 06/22/22 and 06/23/22 Hypomagenseemia - mod 10/11 pm and again 06/22/22 am ad 06/23/22 Hypokalemia - mild 06/23/22     Plan  - replete as needed and monitor  At risk for hypoglycemia   P:   monitor  MSK/DERM Baseline alb 2.8  in Aug  2023 - give regular alcohol intake and cirrhosis - revising this to likely mild malnutution (prior weight loss not known)  Plan - monitpr - continue  TF since 06/20/22  Best Practice (right click and "Reselect all SmartList Selections" daily)  PICC line -since 06/20/22 Diet/type: Start TF 06/20/22 DVT prophylaxis: SCD GI prophylaxis: PPI Lines: N/A Foley:  N/A - purewick as of 06/21/22 Code Status:  full code  Last date of multidisciplinary goals of care discussion    -Joann [name] [phone] He says he called the ER 2h ago 06/19/2022 and was informed "patient is not here". He is upset that ER did not inform him about whereabouts. He says that no one called him yesterday but for admissions nurse last night. He says he was not aware patient is in ICU.  He is upset about this.  He then wanted me to call her EDerek [name] [phone]-> no answer   - called husband 06/20/22 - kept ringing and LMTCB -> 10:02 AM he called back and gave him update. Explained about combo of cirrhosis and acute alcoholic hepatitis and encephalopathy. Explained that if liver continues to fail, mortality is to be expected.   -> he asked if I did put her on "red lock" -> I told him I did NOT put the red lock. He did say that nursing director ENelva Bushdid call  him and explained that we will share medical infor with him. He said he is very upset and he is the voice for her. He said if she said it in the ER and they put red lock they should not have because "she had delirium". He was on the road as truck driver and had EMS called  -> later daughter Joann [name](aged [phone] came to hospital but was turned awa. She called the doc line and told her we are trying to sort out redlock situation and will let her know -> [phone]   06/21/22 - called husband to update. LMTCB . MELD 18 and improving bili but setbacks are hypothermia, worsening paltelets  06/22/22 - advised by compliance via nursing director not to  call husband or  family. She is improving  06/23/22 - slowly improving LFT     ATTESTATION & SIGNATURE   The patient YADHIRA MCKNEELY is critically ill with multiple organ systems failure and requires high complexity decision making for assessment and support, frequent evaluation and titration of therapies, application of advanced monitoring technologies and extensive interpretation of multiple databases.   Critical Care Time devoted to patient care services described in this note is  35  Minutes. This time reflects time of care of this signee Dr Brand Males. This critical care time does not reflect procedure time, or teaching time or supervisory time of PA/NP/Med student/Med Resident etc but could involve care discussion time     Dr. Brand Males, M.D., [facility].C.P Pulmonary and Critical Care Medicine Medical Director - [facility] ICU Staff Physician, [facility] Pager: [phone], If no answer or between  15:00h - 7:00h: call [phone]  06/23/2022 11:21 AM      LABS      PULMONARY No results for input(s): "PHART", "PCO2ART", "PO2ART", "HCO3", "TCO2", "O2SAT" in the last 168 hours.  Invalid input(s): "PCO2", "PO2"  CBC Recent Labs  Lab 06/21/22 0213 06/21/22 1139 06/21/22 1818 06/22/22 0420 06/23/22 0455  HGB 7.0*   < > 7.5* 8.4* 9.1*  HCT 26.1*   < > 26.9* 30.4* 31.9*  WBC 2.8*  --   --  4.4 4.3  PLT 20*  --   --  26* 27*   < > = values in this interval not displayed.    COAGULATION Recent Labs  Lab 06/18/22 2230 06/21/22 0213 06/22/22 0420 06/23/22 0455  INR 1.7* 2.0* 1.8* 1.6*    CARDIAC  No results for input(s): "TROPONINI" in the last 168 hours. No results for input(s): "PROBNP" in the last 168 hours.   CHEMISTRY Recent Labs  Lab 06/18/22 1929 06/19/22 0258 06/20/22 2114 06/21/22 0213 06/21/22 1708 06/22/22 0420 06/22/22 1707 06/23/22 0455  NA 139   < > 141 139 142 146* 141  141  K 2.5*   < > 3.9 3.2* 4.1 4.2 3.5 3.4*  CL 98   < > 117* 116* 118* 118* 114* 111  CO2 19*   < > 20* 20* 19* 22 22 24   GLUCOSE 131*   < > 119* 181* 295* 131* 182* 146*  BUN 13   < > 7 8 7 7 7 7   CREATININE 1.41*   < > 0.57 0.58 0.39* 0.45 0.45 0.33*  CALCIUM 8.3*   < > 7.8* 7.8* 7.9* 8.8* 8.4* 8.7*  MG 2.0  --  1.6* 2.9*  --  1.8  --  1.7  PHOS  --   --   --  1.3*  --  1.4*  --  2.2*   < > = values in this interval not displayed.   Estimated Creatinine Clearance: 91.9 mL/min (A) (by C-G formula based on SCr of 0.33 mg/dL (L)).   LIVER Recent Labs  Lab 06/18/22 1929 06/18/22 2230 06/19/22 1257 06/20/22 0307 06/21/22 0213 06/22/22 0420 06/23/22 0455  AST 275*  --  198* 190* 163* 106*  --   ALT 36  --  25 26 24 19   --   ALKPHOS 195*  --  143* 148* 143* 130*  --   BILITOT 3.7*  --  3.9* 4.0* 2.6* 1.9*  --   PROT 10.5*  --  7.4 7.7 7.5 7.8  --   ALBUMIN 4.0  --  2.9* 2.9* 2.7* 3.3*  --   INR  --  1.7*  --   --  2.0* 1.8* 1.6*     INFECTIOUS Recent Labs  Lab 06/19/22 1230 06/19/22 1257 06/20/22 0307 06/21/22 0213  LATICACIDVEN 2.1*  --  1.4 1.2  PROCALCITON  --  <0.10  --   --      ENDOCRINE CBG (last 3)  Recent Labs    06/23/22 0019 06/23/22 0452 06/23/22 0819  GLUCAP 167* 139* 142*         IMAGING x48h  - image(s) personally visualized  -   highlighted in bold [place] ASCITES (ABDOMEN LIMITED)  Result Date: 06/22/2022 CLINICAL DATA:  History of cirrhosis for evaluation of ascites EXAM: LIMITED ABDOMEN ULTRASOUND FOR ASCITES TECHNIQUE: Limited ultrasound survey for ascites was performed in all four abdominal quadrants. COMPARISON:  Right upper quadrant abdominal ultrasound examination dated 06/19/2022 FINDINGS: Apparent complex fluid within the bilateral upper abdominal quadrants is favored to reflect intraluminal contents within dilated bowel loops rather than complex ascites. No free fluid or fluid collections in the lower quadrants. IMPRESSION: No free  fluid or fluid collections. Apparent complex fluid within the bilateral upper quadrants is favored to reflect intraluminal contents within dilated bowel loops rather than complex ascites. If there is ongoing concern for substantial ascites or hemorrhage, CT can be considered for further evaluation. Electronically Signed   By: Darrin Nipper M.D.   On: 06/22/2022 10:48

## 2022-06-23 NOTE — Progress Notes (Signed)
Gastroenterology Inpatient Follow-up Note   PATIENT IDENTIFICATION  Joann [name] is a 41 y.o. female Hospital Day: 6  SUBJECTIVE  The patient's chart was reviewed. The patient's labs were reviewed. The patient was evaluated this afternoon with her daughter at bedside. Unfortunately the patient is not able to give any history today based on her current sedation level. She remains on tube feeds at this time. No overt GI bleeding has been noted.   OBJECTIVE  Scheduled Inpatient Medications:   Chlorhexidine Gluconate Cloth  6 each Topical Q2200   feeding supplement (PROSource TF20)  60 mL Per Tube Daily   folic acid  1 mg Per Tube Daily   lactulose  30 g Per Tube Q6H   pantoprazole  40 mg Per Tube Daily   prednisoLONE  40 mg Per Tube Daily   rifaximin  550 mg Per Tube BID   sodium chloride flush  10-40 mL Intracatheter Q12H   thiamine  100 mg Per Tube Daily   Or   thiamine  100 mg Intravenous Daily   Continuous Inpatient Infusions:   sodium chloride Stopped (06/20/22 1525)    ceFAZolin (ANCEF) IV Stopped (06/23/22 1017)   dexmedetomidine (PRECEDEX) IV infusion 1 mcg/kg/hr (06/23/22 1200)   feeding supplement (OSMOLITE 1.5 CAL) 50 mL/hr at 06/23/22 1200   lactated ringers 50 mL/hr at 06/23/22 1200   magnesium sulfate bolus IVPB 2 g (06/23/22 1203)   potassium PHOSPHATE IVPB (in mmol)     PRN Inpatient Medications: sodium chloride, docusate, haloperidol lactate, lip balm, LORazepam **OR** LORazepam, ondansetron (ZOFRAN) IV, mouth rinse, polyethylene glycol, sodium chloride flush   Physical Examination  Temp:  [97.2 F (36.2 C)-98.9 F (37.2 C)] 98.7 F (37.1 C) (10/14 1200) Pulse Rate:  [43-73] 70 (10/14 1200) Resp:  [16-22] 20 (10/14 1200) BP: (85-170)/(54-85) 118/69 (10/14 1200) SpO2:  [92 %-100 %] 100 % (10/14 1200) FiO2 (%):  [2 %] 2 % (10/13 1600) Weight:  [78.5 kg] 78.5 kg (10/14 0500) Temp (24hrs), Avg:98 F (36.7 C), Min:97.2 F (36.2 C), Max:98.9 F (37.2  C)  Weight: 78.5 kg GEN: Resting in bed PSYCH: Not cooperative EYE: Sclerae icteric ENT: MMM, Dobbhoff feeding tube in place CV: Nontachycardic RESP: No audible wheezing GI: NABS, soft, protuberant abdomen, rounded MSK/EXT: Trace bilateral pedal edema NEURO: Not able to assess, clonus appreciated on legs   Review of Data   Laboratory Studies   Recent Labs  Lab 06/21/22 0213 06/21/22 1708 06/22/22 0420 06/22/22 1707 06/23/22 0455  NA 139   < > 146*   < > 141  K 3.2*   < > 4.2   < > 3.4*  CL 116*   < > 118*   < > 111  CO2 20*   < > 22   < > 24  BUN 8   < > 7   < > 7  CREATININE 0.58   < > 0.45   < > 0.33*  GLUCOSE 181*   < > 131*   < > 146*  CALCIUM 7.8*   < > 8.8*   < > 8.7*  MG 2.9*  --  1.8  --  1.7  PHOS 1.3*  --  1.4*  --  2.2*   < > = values in this interval not displayed.   Recent Labs  Lab 06/22/22 0420  AST 106*  ALT 19  ALKPHOS 130*    Recent Labs  Lab 06/21/22 0213 06/21/22 1139 06/22/22 0420 06/23/22 0455  WBC  2.8*  --  4.4 4.3  HGB 7.0*   < > 8.4* 9.1*  HCT 26.1*   < > 30.4* 31.9*  PLT 20*  --  26* 27*   < > = values in this interval not displayed.   Recent Labs  Lab 06/21/22 0213 06/22/22 0420 06/23/22 0455  INR 2.0* 1.8* 1.6*   MELD 3.0: 15 at 06/23/2022  4:55 AM MELD-Na: 14 at 06/23/2022  4:55 AM Calculated from: Serum Creatinine: 0.33 mg/dL (Using min of 1 mg/dL) at 06/23/2022  4:55 AM Serum Sodium: 141 mmol/L (Using max of 137 mmol/L) at 06/23/2022  4:55 AM Total Bilirubin: 1.9 mg/dL at 06/22/2022  4:20 AM Serum Albumin: 3.3 g/dL at 06/22/2022  4:20 AM INR(ratio): 1.6 at 06/23/2022  4:55 AM Age at listing (hypothetical): 45 years Sex: Female at 06/23/2022  4:55 AM   Imaging Studies  [place] ASCITES (ABDOMEN LIMITED)  Result Date: 06/22/2022 CLINICAL DATA:  History of cirrhosis for evaluation of ascites EXAM: LIMITED ABDOMEN ULTRASOUND FOR ASCITES TECHNIQUE: Limited ultrasound survey for ascites was performed in all four  abdominal quadrants. COMPARISON:  Right upper quadrant abdominal ultrasound examination dated 06/19/2022 FINDINGS: Apparent complex fluid within the bilateral upper abdominal quadrants is favored to reflect intraluminal contents within dilated bowel loops rather than complex ascites. No free fluid or fluid collections in the lower quadrants. IMPRESSION: No free fluid or fluid collections. Apparent complex fluid within the bilateral upper quadrants is favored to reflect intraluminal contents within dilated bowel loops rather than complex ascites. If there is ongoing concern for substantial ascites or hemorrhage, CT can be considered for further evaluation. Electronically Signed   By: Darrin Nipper M.D.   On: 06/22/2022 10:48    GI Procedures and Studies  No new studies to review   ASSESSMENT  Joann [name] is a 41 y.o. 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  The patient is hemodynamically stable currently.  Appreciate the ICU service and trying to help this patient through her alcohol withdrawal.  She is being treated for alcoholic hepatitis and remains on steroids.  She will be recalculated for her Lilly score next week to see if continued use of steroids will be helpful or not.  I am hopeful that this is not also portosystemic encephalopathy playing a role with her.  Thankfully no evidence of significant ascites to be concern for SBP at this time based on last liver ultrasound just done yesterday.  Hopefully she will be more awake tomorrow.  We need to continue the aggressive lactulose use and if she is not having at least 3-4 bowel movements per day we may need to increase her lactulose even further.  I am going to add zinc in an effort of helping with potential portosystemic encephalopathy, as I  can see her ammonia is elevated (though I do not normally think of ammonia being helpful it is something that we may just see how things change as we become more aggressive).  She is in a difficult situation and until she can be abstinent of alcohol for a long period in time, she will never end up being a transplant candidate and that is unfortunate.   PLAN/RECOMMENDATIONS  Continue tube feeds as per nutrition Continue lactulose 30 g every 6 hours - If not having at least 3 bowel movements per day, we will need to  increase lactulose to every 4 hours Start zinc sulfate to 20 mg twice daily Appreciate ICU service with alcohol withdrawal pathway At some point consider updated endoscopy to see what has progressed over the last few years in regards to her varices Continue prednisolone 40 mg daily Recheck Lille score on 10/17 to see if continued steroids will be necessary If transfusion is necessary, go for hemoglobin goal 7-8   Please page/call with questions or concerns.   Justice Britain, MD [facility] Office # [phone]    LOS: 5 days  Irving Copas  06/23/2022, 12:17 PM

## 2022-06-24 ENCOUNTER — Inpatient Hospital Stay (HOSPITAL_COMMUNITY): Payer: Medicaid Other

## 2022-06-24 ENCOUNTER — Encounter (HOSPITAL_COMMUNITY): Payer: Self-pay | Admitting: Critical Care Medicine

## 2022-06-24 DIAGNOSIS — G9340 Encephalopathy, unspecified: Secondary | ICD-10-CM | POA: Diagnosis not present

## 2022-06-24 DIAGNOSIS — K7011 Alcoholic hepatitis with ascites: Secondary | ICD-10-CM | POA: Diagnosis not present

## 2022-06-24 DIAGNOSIS — F10931 Alcohol use, unspecified with withdrawal delirium: Secondary | ICD-10-CM | POA: Diagnosis not present

## 2022-06-24 DIAGNOSIS — F19931 Other psychoactive substance use, unspecified with withdrawal delirium: Secondary | ICD-10-CM | POA: Diagnosis not present

## 2022-06-24 DIAGNOSIS — K7031 Alcoholic cirrhosis of liver with ascites: Secondary | ICD-10-CM | POA: Diagnosis not present

## 2022-06-24 LAB — BASIC METABOLIC PANEL
Anion gap: 4 — ABNORMAL LOW (ref 5–15)
BUN: 8 mg/dL (ref 6–20)
CO2: 23 mmol/L (ref 22–32)
Calcium: 8.7 mg/dL — ABNORMAL LOW (ref 8.9–10.3)
Chloride: 112 mmol/L — ABNORMAL HIGH (ref 98–111)
Creatinine, Ser: <0.3 mg/dL — ABNORMAL LOW (ref 0.44–1.00)
Glucose, Bld: 181 mg/dL — ABNORMAL HIGH (ref 70–99)
Potassium: 3.7 mmol/L (ref 3.5–5.1)
Sodium: 139 mmol/L (ref 135–145)

## 2022-06-24 LAB — GLUCOSE, CAPILLARY
Glucose-Capillary: 118 mg/dL — ABNORMAL HIGH (ref 70–99)
Glucose-Capillary: 119 mg/dL — ABNORMAL HIGH (ref 70–99)
Glucose-Capillary: 125 mg/dL — ABNORMAL HIGH (ref 70–99)
Glucose-Capillary: 127 mg/dL — ABNORMAL HIGH (ref 70–99)
Glucose-Capillary: 130 mg/dL — ABNORMAL HIGH (ref 70–99)
Glucose-Capillary: 146 mg/dL — ABNORMAL HIGH (ref 70–99)
Glucose-Capillary: 151 mg/dL — ABNORMAL HIGH (ref 70–99)
Glucose-Capillary: 170 mg/dL — ABNORMAL HIGH (ref 70–99)

## 2022-06-24 LAB — CBC
HCT: 33.3 % — ABNORMAL LOW (ref 36.0–46.0)
Hemoglobin: 9.4 g/dL — ABNORMAL LOW (ref 12.0–15.0)
MCH: 23.9 pg — ABNORMAL LOW (ref 26.0–34.0)
MCHC: 28.2 g/dL — ABNORMAL LOW (ref 30.0–36.0)
MCV: 84.5 fL (ref 80.0–100.0)
Platelets: 33 10*3/uL — ABNORMAL LOW (ref 150–400)
RBC: 3.94 MIL/uL (ref 3.87–5.11)
RDW: 29.2 % — ABNORMAL HIGH (ref 11.5–15.5)
WBC: 6.2 10*3/uL (ref 4.0–10.5)
nRBC: 0.5 % — ABNORMAL HIGH (ref 0.0–0.2)

## 2022-06-24 LAB — HEPATIC FUNCTION PANEL
ALT: 17 U/L (ref 0–44)
AST: 63 U/L — ABNORMAL HIGH (ref 15–41)
Albumin: 2.9 g/dL — ABNORMAL LOW (ref 3.5–5.0)
Alkaline Phosphatase: 109 U/L (ref 38–126)
Bilirubin, Direct: 0.7 mg/dL — ABNORMAL HIGH (ref 0.0–0.2)
Indirect Bilirubin: 0.9 mg/dL (ref 0.3–0.9)
Total Bilirubin: 1.6 mg/dL — ABNORMAL HIGH (ref 0.3–1.2)
Total Protein: 7.1 g/dL (ref 6.5–8.1)

## 2022-06-24 LAB — PROTIME-INR
INR: 1.6 — ABNORMAL HIGH (ref 0.8–1.2)
Prothrombin Time: 18.9 seconds — ABNORMAL HIGH (ref 11.4–15.2)

## 2022-06-24 LAB — CULTURE, BLOOD (ROUTINE X 2): Culture: NO GROWTH

## 2022-06-24 LAB — MAGNESIUM: Magnesium: 1.7 mg/dL (ref 1.7–2.4)

## 2022-06-24 LAB — PHOSPHORUS: Phosphorus: 3.1 mg/dL (ref 2.5–4.6)

## 2022-06-24 LAB — AMMONIA: Ammonia: 166 umol/L — ABNORMAL HIGH (ref 9–35)

## 2022-06-24 MED ORDER — LACTULOSE 10 GM/15ML PO SOLN
30.0000 g | ORAL | Status: DC
  Administered 2022-06-24 – 2022-06-27 (×14): 30 g
  Filled 2022-06-24 (×15): qty 45

## 2022-06-24 MED ORDER — LACTULOSE ENEMA: 300.0000 mL | Freq: Once | ORAL | Status: DC | PRN

## 2022-06-24 MED ORDER — POTASSIUM CHLORIDE 20 MEQ PO PACK
40.0000 meq | PACK | Freq: Once | ORAL | Status: AC
  Administered 2022-06-24: 40 meq
  Filled 2022-06-24: qty 2

## 2022-06-24 MED ORDER — PHENYLEPHRINE HCL-NACL 20-0.9 MG/250ML-% IV SOLN
0.0000 ug/min | INTRAVENOUS | Status: DC
  Administered 2022-06-24: 20 ug/min via INTRAVENOUS
  Administered 2022-06-25: 15 ug/min via INTRAVENOUS
  Administered 2022-06-25: 70 ug/min via INTRAVENOUS
  Administered 2022-06-25: 50 ug/min via INTRAVENOUS
  Administered 2022-06-25: 70 ug/min via INTRAVENOUS
  Administered 2022-06-26 (×2): 50 ug/min via INTRAVENOUS
  Administered 2022-06-26: 20 ug/min via INTRAVENOUS
  Filled 2022-06-24 (×7): qty 250

## 2022-06-24 MED ORDER — ALBUMIN HUMAN 25 % IV SOLN
12.5000 g | Freq: Once | INTRAVENOUS | Status: AC
  Administered 2022-06-24: 12.5 g via INTRAVENOUS
  Filled 2022-06-24: qty 50

## 2022-06-24 MED ORDER — ZINC SULFATE 220 (50 ZN) MG PO CAPS
220.0000 mg | ORAL_CAPSULE | Freq: Every day | ORAL | Status: DC
  Administered 2022-06-24 – 2022-06-29 (×6): 220 mg
  Filled 2022-06-24 (×6): qty 1

## 2022-06-24 MED ORDER — MAGNESIUM SULFATE 4 GM/100ML IV SOLN
4.0000 g | Freq: Once | INTRAVENOUS | Status: AC
  Administered 2022-06-24: 4 g via INTRAVENOUS
  Filled 2022-06-24: qty 100

## 2022-06-24 NOTE — Progress Notes (Signed)
Holding tube feeds per Dr. Chase Caller d/t abdominal distention/constipation.  Ventura Bruns, RN 06/24/22 12:30 PM

## 2022-06-24 NOTE — Progress Notes (Signed)
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 She is non verbal on my exam but moving around and sitting up trying to get out of bed so h/o is obtained from chart review and EDP.   Per report pt had been attempting to detox at home for the past couple days, although etoh level is 158. She was unable to disclose her date of last drink but is now having confusion, weakness and fatigue. It is unclear if she has been compliant with her medications at home as well, however with her ammonia at 150's doubtful.    She is unable to provide any further ros or history.   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 Admitted on prexedex gtt and neo gtt added.  Curerntly off.  On 2LNC . Marland Kitchen Afebrile. Normal WBC. Lactate > 6 this am with very ppor clearance t hough AKI resolved. Platelets down to 28K (baseline 23K- 55k).  LFTs are c/w Acute alcholic hepatitis. Discrimiant functin score 26.7 -> Start solumedrol 06/20/22 - concerns from nursing staff yesterday about  sharing medical information with husband because of red llock in the chart.  Clarification from ICU nursing director this morning from legal team: That in the best interest of patient okay to share medical information with her husband especially in the context of critical illness and decision making. urrently on Neo-Synephrine and Precedex and D5 LR.  Also on antibiotics and steroids.  Encephalopathy continues.  Growing staph and BC ID on Ancef added yesterday but remains afebrile. D2 steroids GI consult  Start tube feeds PICC placed 06/21/22 - INR worse at 2.0., Platelets worse to 20K but bili improved to 2.6 and AST improved to 160s.  /hypothermic. WBC down to 2.8K. -> Bair hugger + No  updated on BCID - still staph.  Off pressors. On 3L [place]. Ativan given overnight. Still on precedex gtt -> on and off agoitation. . D3 steroids. On Tube feeds 1U PROBC for hgb < 7 MELD 18 06/22/2022:  d4 steroids. Bili, INR, AST all better. Agitated overnight and Rx  haldo, 5.50am and Ativan x 4.30am. Back on TF. Some bradyardia HR 48 with precedex.  On TF. Admit blood cuolture growing MSSA. Ammoni down to 127 after lactulsoe increased. Afebrile. Ws hypertensive after PRBC -> got hydralazing -> now soft BP Per ICU nursing director: currently per legal not to call husband MELD 15 06/23/22 - day 5 steroid.  Severe agitation last night and acute nursing.  Precedex feeling increased.  Ankle restraints applied.  Remains  on 2 L nasal cannula.  Remains on Precedex.  Remains afebrile with normal white count.  Is on cefazolin lactulose and Xifaxan.  Significance for low magnesium low phosphorus and low potassium today.  Liver function [platelet and INR ) improving. GI wants goal BM to be 3/day -> increased lacutuloise GI started Zinc Sulfate to help portosystemic encephalopathy Phenobarb protocol added (did help her in June 2023)   Interim History / Subjective:    06/24/2022 - reamins n 2L [place]. Now hypotensive on precedex  -> alb/neo being started. On TF. Afebrile. Improving LFT (bili, platelet, INR and AST). STill not having 3BM/day. MELD down to 13 points   Objective   Blood pressure (!) 74/53, pulse (!) 56, temperature 97.8 F (36.6 C), temperature source Axillary, resp. rate (!) 21, height 5' 4"  (1.626 m), weight 80.4 kg, SpO2 100 %.        Intake/Output Summary (Last 24 hours) at 06/24/2022 0852 Last data filed at 06/24/2022 7124 Gross per 24 hour  Intake 4107.33 ml  Output 1000 ml  Net 3107.33 ml   Filed Weights   06/22/22 0425 06/23/22 0500 06/24/22 0426  Weight: 78.6 kg 78.5 kg 80.4 kg    General Appearance:  Looks criticall ill  Head:  Normocephalic, without obvious abnormality, atraumatic Eyes:  PERRL - yes, conjunctiva/corneas - mddy     Ears:  Normal external ear canals, both ears Nose:  G tube - yes an dhas [facility] o2 Throat:  ETT TUBE - no , OG tube - no. Neck:  Supple,  No enlargement/tenderness/nodules Lungs: Clear to auscultation bilaterally,  Heart:  S1 and S2 normal, no murmur, CVP - no.  Pressors - no Abdomen:  Soft, no masses, no organomegaly but ? Slight more distended Genitalia / Rectal:  Not done Extremities:  Extremities- intact Skin:  ntact in exposed areas . Sacral area - no Neurologic:  Sedation - precedex gtt + ativan protocol +  phenobarb protocol -> RASS - -3 . Moves all 4s - yes when agitated. CAM-ICU - PISITIVe when intermittently gest agitated . Orientation - NOT       Resolved Hospital Problem list    No prior hx of cardiac diease but at risk for etoh cardiomyopathy - echo normal 06/19/22   Sinus tachy    Assessment & Plan:   AT risk for intubation due to acute agitated enceophalopathy (CXR clear 06/19/22)  - Present on Admit   06/24/2022 -> currently protecting airway  P:   Intubate if worse O2 for pulse ox goal > 92%    Acute agitated encephalopathy due to DTs/ETOH withdrawal - Present on Admit - Precedex since 06/28/22 - Lactulose and  xifaxan started 06/19/22 via panda - Haldol prn since 06/21/22 - Phenobarb protocol sinc 06/23/22  10/143 -fluctating encephalopathy and ongoing (noted to be on cephalosporins)  P:   Precredex gtt to continue Ativan CIWA prn protocol Lactulseo/Xifaxan since 06/19/22 -> increased lactulose 06/21/22 -> increased 06/23/22 and again 06/24/22 Haldol prn sinc 06/21/22 Phenobarb since 06/23/22 Restrains for safety to continue on 06/23/22 RASS goal o to -2  ? Change to non-cephalosporin antibiotic due to confusion - need to check with ID  Circulatory shock at admit due to precedex +/- sepsis  10/15 -Bacl on neo gtt   P:  MAP goal > 65 Neo gtt as needed Alb for volume repletion Monitor      MSSA Bacteremia x  1 bottle-Present on Admit  06/24/22- afebrile. Normal PCT. Unclear if contaminant  P:  Ancef 06/19/22 >> (ID REcommending 2 weeks through 07/03/22) No need for TEE ID following   AKI at admit   10/14-resolved as of 06/19/22  P:  monitor   Sevre lactic acidosis at admit -   10/14 - resolved as of 06/20/22. Normmal CK  P: Monitor     Alcoholic cirrhosis with portal hypertension and eso varices  but wihtout ascites- Prior to & Present on Admit Acute alcoholic intoxication- Present on Admit Acute alcoholic hepatitis - Present on Admit (disc score 26.7); normal tylenol land sal evel at admit. STArted solumedrol 06/20/22  06/23/22 - Bili, INR  and platelelet improving.  MELD 18 on 06/21/22 -> 15 on 06/22/22)  P:   Continue steroids D5 prednisolone  - track Lille score on day 7 at 06/26/22 Lactulose continue Continue xifaxan Thiamine/Folic    Chronic anemia - baseline hgb 7.5 - severe - Prior to & Present on Admit  - s/P PRBC 06/21/22 for hgb < 30m of criticial ilness  10/14 - no active bleed  P:  - PRBC for hgb </= 6.9gm%  (will not transfuse 06/21/22)  - exceptions are   -  if ACS susepcted/confirmed then transfuse for hgb </= 8.0gm%,  or    -   active bleeding with hemodynamic instability, then transfuse regardless of hemoglobin value   At at all times try to transfuse 1 unit prbc as possible with exception of active hemorrhage    Moderate - severe thrombocytopenia at baseline (baseline 23-76) - likey due to cirrhos s- Prior to & Present on Admit   10/15- plat 20'sk but improviong   P Monitor No heparin/lovenoix/AC Platelet if bleeding or <= 10K and for procedures   Electrolyte abnormalities Hypo-phsatemia severe 10/12 and again 06/22/22 and 06/23/22 -> resolved 06/24/22 Hypomagenseemia - mod 10/11 pm and again 06/22/22 am ad 06/23/22 and 06/24/22 Hypokalemia - mild 06/23/22 and 06/24/33     Plan  - replete Mag and K and monitor  At risk for hypoglycemia   P:   monitor  MSK/DERM Baseline alb 2.8  in Aug 2023 - give regular alcohol intake and cirrhosis - revising this to likely mild malnutution (prior weight loss not known)  Plan - monitpr - continue  TF since 06/20/22  Best Practice (right click and "Reselect all SmartList Selections" daily)  PICC line -since 06/20/22 Diet/type: Start TF 06/20/22 DVT prophylaxis: SCD GI prophylaxis: PPI Lines: N/A Foley:  N/A - purewick as of 06/21/22 Code Status:  full code  Last date of multidisciplinary goals of care discussion    -Junelle Hashemi [phone] He says he called the ER 2h ago 06/19/2022 and was informed "patient is not here". He is upset that ER did not inform him about whereabouts. He says that no one called him yesterday but for admissions nurse last night. He says he was not aware patient is in ICU.  He is upset about this.  He then wanted me to call her EDerek Jackmother [phone]-> no answer   - called husband 06/20/22 - kept ringing and LMTCB -> 10:02 AM he called back and gave him update. Explained about combo of cirrhosis and acute alcoholic hepatitis and encephalopathy. Explained that if liver continues to fail, mortality is to be expected.    -> he asked if I did put her on "red lock" -> I told him I did NOT put the red lock. He did say that nursing director ENelva Bushdid call  him and explained  that we will share medical infor with him. He said he is very upset and he is the voice for her. He said if she said it in the ER and they put red lock they should not have because "she had delirium". He was on the road as truck driver and had EMS called  -> later daughter Lynett Fish (aged 77) came to hospital but was turned awa. She called the doc line and told her we are trying to sort out redlock situation and will let her know -> [phone]   06/21/22 - called husband to update. LMTCB . MELD 18 and improving bili but setbacks are hypothermia, worsening paltelets  06/22/22 - advised by compliance via nursing director not to call husband or family. She is improving  06/23/22 - slowly improving LFT. Husband not called based on advice from compliance  10/15/123 - husband not called      ATTESTATION & SIGNATURE   The patient Joann [name] is critically ill with multiple organ systems failure and requires high complexity decision making for assessment and support, frequent evaluation and titration of therapies, application of advanced monitoring technologies and extensive interpretation of multiple databases.   Critical Care Time devoted to patient care services described in this note is  35  Minutes. This time reflects time of care of this signee Dr Brand Males. This critical care time does not reflect procedure time, or teaching time or supervisory time of PA/NP/Med student/Med Resident etc but could involve care discussion time     Dr. Brand Males, M.D., [facility].C.P Pulmonary and Critical Care Medicine Medical Director - [facility] ICU Staff Physician, [place] [facility] Pager: [phone], If no answer or between  15:00h - 7:00h: call [phone]  06/24/2022 9:12  AM       LABS      PULMONARY No results for input(s): "PHART", "PCO2ART", "PO2ART", "HCO3", "TCO2", "O2SAT" in the last 168 hours.  Invalid input(s): "PCO2", "PO2"  CBC Recent Labs  Lab 06/22/22 0420 06/23/22 0455 06/24/22 0545  HGB 8.4* 9.1* 9.4*  HCT 30.4* 31.9* 33.3*  WBC 4.4 4.3 6.2  PLT 26* 27* 33*    COAGULATION Recent Labs  Lab 06/18/22 2230 06/21/22 0213 06/22/22 0420 06/23/22 0455 06/24/22 0545  INR 1.7* 2.0* 1.8* 1.6* 1.6*    CARDIAC  No results for input(s): "TROPONINI" in the last 168 hours. No results for input(s): "PROBNP" in the last 168 hours.   CHEMISTRY Recent Labs  Lab 06/20/22 2114 06/21/22 0213 06/21/22 1708 06/22/22 0420 06/22/22 1707 06/23/22 0455 06/24/22 0545  NA 141 139 142 146* 141 141 139  K 3.9 3.2* 4.1 4.2 3.5 3.4* 3.7  CL 117* 116* 118* 118* 114* 111 112*  CO2 20* 20* 19* 22 22 24 23   GLUCOSE 119* 181* 295* 131* 182* 146* 181*  BUN 7 8 7 7 7 7 8   CREATININE 0.57 0.58 0.39* 0.45 0.45 0.33* <0.30*  CALCIUM 7.8* 7.8* 7.9* 8.8* 8.4* 8.7* 8.7*  MG 1.6* 2.9*  --  1.8  --  1.7 1.7  PHOS  --  1.3*  --  1.4*  --  2.2* 3.1   CrCl cannot be calculated (This lab value cannot be used to calculate CrCl because it is not a number: <0.30).   LIVER Recent Labs  Lab 06/18/22 2230 06/19/22 1257 06/20/22 0307 06/21/22 0213 06/22/22 0420 06/23/22 0455 06/24/22 0545  AST  --  198* 190* 163* 106*  --  63*  ALT  --  25 26 24 19   --  17  ALKPHOS  --  143* 148* 143* 130*  --  109  BILITOT  --  3.9* 4.0* 2.6* 1.9*  --  1.6*  PROT  --  7.4 7.7 7.5 7.8  --  7.1  ALBUMIN  --  2.9* 2.9* 2.7* 3.3*  --  2.9*  INR 1.7*  --   --  2.0* 1.8* 1.6* 1.6*     INFECTIOUS Recent Labs  Lab 06/19/22 1230 06/19/22 1257 06/20/22 0307 06/21/22 0213  LATICACIDVEN 2.1*  --  1.4 1.2  PROCALCITON  --  <0.10  --   --      ENDOCRINE CBG (last 3)  Recent Labs    06/23/22 2344 06/24/22 0448 06/24/22 0814  GLUCAP 170* 130* 127*          IMAGING x48h  - image(s) personally visualized  -   highlighted in bold [place] ASCITES (ABDOMEN LIMITED)  Result Date: 06/22/2022 CLINICAL DATA:  History of cirrhosis for evaluation of ascites EXAM: LIMITED ABDOMEN ULTRASOUND FOR ASCITES TECHNIQUE: Limited ultrasound survey for ascites was performed in all four abdominal quadrants. COMPARISON:  Right upper quadrant abdominal ultrasound examination dated 06/19/2022 FINDINGS: Apparent complex fluid within the bilateral upper abdominal quadrants is favored to reflect intraluminal contents within dilated bowel loops rather than complex ascites. No free fluid or fluid collections in the lower quadrants. IMPRESSION: No free fluid or fluid collections. Apparent complex fluid within the bilateral upper quadrants is favored to reflect intraluminal contents within dilated bowel loops rather than complex ascites. If there is ongoing concern for substantial ascites or hemorrhage, CT can be considered for further evaluation. Electronically Signed   By: Darrin Nipper M.D.   On: 06/22/2022 10:48

## 2022-06-24 NOTE — Progress Notes (Addendum)
Gastroenterology Inpatient Follow-up Note   PATIENT IDENTIFICATION  Joann [name] is a 41 y.o. female Hospital Day: 7  SUBJECTIVE  The patient's chart was reviewed. The patient's labs were reviewed. The patient is seen at bedside but is not able to be a part of any conversation as she is sedated heavily. Is difficult to understand how much stool the patient is having as a result of her having a Flexi-Seal but large amount of output is not noted after discussion with the patient's RN this morning. Wound  OBJECTIVE  Scheduled Inpatient Medications:   Chlorhexidine Gluconate Cloth  6 each Topical Q2200   feeding supplement (PROSource TF20)  60 mL Per Tube Daily   folic acid  1 mg Per Tube Daily   lactulose  30 g Per Tube Q4H   pantoprazole  40 mg Per Tube Daily   PHENObarbital  65 mg Intravenous Q8H   Followed by   [START ON 06/25/2022] PHENObarbital  32.5 mg Intravenous Q8H   prednisoLONE  40 mg Per Tube Daily   rifaximin  550 mg Per Tube BID   sodium chloride flush  10-40 mL Intracatheter Q12H   thiamine  100 mg Per Tube Daily   Or   thiamine  100 mg Intravenous Daily   zinc sulfate  220 mg Per Tube Daily   Continuous Inpatient Infusions:   sodium chloride 10 mL/hr at 06/24/22 0628   albumin human      ceFAZolin (ANCEF) IV 2 g (06/24/22 0923)   dexmedetomidine (PRECEDEX) IV infusion 1 mcg/kg/hr (06/24/22 0933)   feeding supplement (OSMOLITE 1.5 CAL) 50 mL/hr at 06/24/22 8676   lactated ringers 50 mL/hr at 06/24/22 1950   magnesium sulfate bolus IVPB     phenylephrine (NEO-SYNEPHRINE) Adult infusion 20 mcg/min (06/24/22 0841)   PRN Inpatient Medications: sodium chloride, docusate, haloperidol lactate, lactulose, lip balm, LORazepam **OR** LORazepam, LORazepam, ondansetron (ZOFRAN) IV, mouth rinse, polyethylene glycol, sodium chloride flush   Physical Examination  Temp:  [97.4 F (36.3 C)-98.9 F (37.2 C)] 97.5 F (36.4 C) (10/15 0855) Pulse Rate:  [44-78] 56 (10/15  0800) Resp:  [16-22] 21 (10/15 0800) BP: (74-163)/(53-97) 74/53 (10/15 0800) SpO2:  [91 %-100 %] 100 % (10/15 0800) Weight:  [80.4 kg] 80.4 kg (10/15 0426) Temp (24hrs), Avg:98 F (36.7 C), Min:97.4 F (36.3 C), Max:98.9 F (37.2 C)  Weight: 80.4 kg GEN: Resting in bed PSYCH: Not cooperative EYE: Sclerae icteric ENT: MMM, Dobbhoff feeding tube in place CV: Nontachycardic RESP: No audible wheezing GI: Bowel sounds are hypoactive today compared to yesterday, protuberant abdomen, rounded MSK/EXT: Trace bilateral pedal edema NEURO: Not able to assess, clonus is less appreciated today, deep palpation to patient's xiphoid does not instigate awareness or opening of eyes   Review of Data   Laboratory Studies   Recent Labs  Lab 06/22/22 0420 06/22/22 1707 06/23/22 0455 06/24/22 0545  NA 146*   < > 141 139  K 4.2   < > 3.4* 3.7  CL 118*   < > 111 112*  CO2 22   < > 24 23  BUN 7   < > 7 8  CREATININE 0.45   < > 0.33* <0.30*  GLUCOSE 131*   < > 146* 181*  CALCIUM 8.8*   < > 8.7* 8.7*  MG 1.8  --  1.7 1.7  PHOS 1.4*  --  2.2* 3.1   < > = values in this interval not displayed.   Recent Labs  Lab  06/24/22 0545  AST 63*  ALT 17  ALKPHOS 109    Recent Labs  Lab 06/22/22 0420 06/23/22 0455 06/24/22 0545  WBC 4.4 4.3 6.2  HGB 8.4* 9.1* 9.4*  HCT 30.4* 31.9* 33.3*  PLT 26* 27* 33*   Recent Labs  Lab 06/22/22 0420 06/23/22 0455 06/24/22 0545  INR 1.8* 1.6* 1.6*   MELD 3.0: 15 at 06/24/2022  5:45 AM MELD-Na: 13 at 06/24/2022  5:45 AM Calculated from: Serum Creatinine: 0.33 mg/dL (Using min of 1 mg/dL) at 06/23/2022  4:55 AM Serum Sodium: 139 mmol/L (Using max of 137 mmol/L) at 06/24/2022  5:45 AM Total Bilirubin: 1.6 mg/dL at 06/24/2022  5:45 AM Serum Albumin: 2.9 g/dL at 06/24/2022  5:45 AM INR(ratio): 1.6 at 06/24/2022  5:45 AM Age at listing (hypothetical): 12 years Sex: Female at 06/24/2022  5:45 AM   Imaging Studies  [place] ASCITES (ABDOMEN LIMITED)  Result  Date: 06/22/2022 CLINICAL DATA:  History of cirrhosis for evaluation of ascites EXAM: LIMITED ABDOMEN ULTRASOUND FOR ASCITES TECHNIQUE: Limited ultrasound survey for ascites was performed in all four abdominal quadrants. COMPARISON:  Right upper quadrant abdominal ultrasound examination dated 06/19/2022 FINDINGS: Apparent complex fluid within the bilateral upper abdominal quadrants is favored to reflect intraluminal contents within dilated bowel loops rather than complex ascites. No free fluid or fluid collections in the lower quadrants. IMPRESSION: No free fluid or fluid collections. Apparent complex fluid within the bilateral upper quadrants is favored to reflect intraluminal contents within dilated bowel loops rather than complex ascites. If there is ongoing concern for substantial ascites or hemorrhage, CT can be considered for further evaluation. Electronically Signed   By: Darrin Nipper M.D.   On: 06/22/2022 10:48    GI Procedures and Studies  No new studies to review   ASSESSMENT  Ms. Reigel is a 41 y.o. 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  The patient seems to be hemodynamically stable this morning.  However it is hard to interpret other than looking at her labs currently how she is doing overall at this singular moment of time.  I think that it makes sense to go ahead and increase her lactulose since she seems to not be having a significant mount of output on her new Flexi-Seal.  If by this evening she has not moved her bowels significantly, then a rectal lactulose enema will be recommended and I have ordered for that to be done.  I am going to add zinc as had been my plan today to see how things are going.  I want to get a KUB today as well as a result of the patient's hypoactive  bowel sounds compared to yesterday and ensure were not developing or seeing an ileus occur for her.  I would continue her tube feeds as you are doing otherwise as this is helpful for her.  Appreciate the ICU service in regards to optimizing her and getting her through this alcohol withdrawal.  How much of this mental status issue is portosystemic encephalopathy as well will be hard to determine but we need to continue her Xifaxan as well.  Time will tell us how she does.  The inpatient GI service will be following along and tomorrow Dr. Loletha Carrow takes over for the [facility] group.   PLAN/RECOMMENDATIONS  Continue tube feeds as per nutrition Agree with increase to  lactulose 30 g every 4 hours - If we do not see an increase in her overall bowel movements by this afternoon, would perform a rectal enema of lactulose this evening to try to help move things along as well (I have ordered the rectal lactulose as needed for this evening) Continue Xifaxan twice daily Today I started zinc sulfate 220 mg twice daily (if able to get through Dobbhoff if not can hold) KUB portable to evaluate for potential ileus with change in bowel sounds today Appreciate ICU service with alcohol withdrawal pathway Continue prednisolone 40 mg daily Recheck Lille score on 10/17 to see if continued steroids will be necessary If transfusion is necessary, go for hemoglobin goal 7-8 At some point consider updated endoscopy to see what has progressed over the last few years in regards to her varices   Please page/call with questions or concerns.   Justice Britain, MD [facility] Office # [phone]    LOS: 6 days  Irving Copas  06/24/2022, 9:54 AM

## 2022-06-24 NOTE — Plan of Care (Signed)
Discussed with patient plan of care for the evening, pain management and sedation with no evidence of learning at this time.  May have to go up on her sedation with patient having a screaming episode.  Izell Falman RN hung another Precedex for me while I was in another room.  When Levophed was stopped the patient had dropped to be hypotensive will try to see if the bottom limit change be changed dropped to 15 from 20 at this time  Problem: Education: Goal: Knowledge of General Education information will improve Description: Including pain rating scale, medication(s)/side effects and non-pharmacologic comfort measures Outcome: Progressing   Problem: Health Behavior/Discharge Planning: Goal: Ability to manage health-related needs will improve Outcome: Progressing

## 2022-06-25 ENCOUNTER — Inpatient Hospital Stay (HOSPITAL_COMMUNITY): Payer: Medicaid Other

## 2022-06-25 DIAGNOSIS — D62 Acute posthemorrhagic anemia: Secondary | ICD-10-CM | POA: Diagnosis not present

## 2022-06-25 DIAGNOSIS — K920 Hematemesis: Secondary | ICD-10-CM | POA: Insufficient documentation

## 2022-06-25 DIAGNOSIS — F10931 Alcohol use, unspecified with withdrawal delirium: Secondary | ICD-10-CM | POA: Diagnosis not present

## 2022-06-25 DIAGNOSIS — D696 Thrombocytopenia, unspecified: Secondary | ICD-10-CM

## 2022-06-25 DIAGNOSIS — K7682 Hepatic encephalopathy: Secondary | ICD-10-CM | POA: Diagnosis not present

## 2022-06-25 DIAGNOSIS — G9340 Encephalopathy, unspecified: Secondary | ICD-10-CM | POA: Diagnosis not present

## 2022-06-25 LAB — CBC
HCT: 37.1 % (ref 36.0–46.0)
HCT: 31.5 % — ABNORMAL LOW (ref 36.0–46.0)
Hemoglobin: 10.1 g/dL — ABNORMAL LOW (ref 12.0–15.0)
Hemoglobin: 8.9 g/dL — ABNORMAL LOW (ref 12.0–15.0)
MCH: 23.3 pg — ABNORMAL LOW (ref 26.0–34.0)
MCH: 23.7 pg — ABNORMAL LOW (ref 26.0–34.0)
MCHC: 27.2 g/dL — ABNORMAL LOW (ref 30.0–36.0)
MCHC: 28.3 g/dL — ABNORMAL LOW (ref 30.0–36.0)
MCV: 85.7 fL (ref 80.0–100.0)
MCV: 84 fL (ref 80.0–100.0)
Platelets: 87 10*3/uL — ABNORMAL LOW (ref 150–400)
Platelets: 119 10*3/uL — ABNORMAL LOW (ref 150–400)
RBC: 4.33 MIL/uL (ref 3.87–5.11)
RBC: 3.75 MIL/uL — ABNORMAL LOW (ref 3.87–5.11)
RDW: 29.8 % — ABNORMAL HIGH (ref 11.5–15.5)
RDW: 30.5 % — ABNORMAL HIGH (ref 11.5–15.5)
WBC: 10.7 10*3/uL — ABNORMAL HIGH (ref 4.0–10.5)
WBC: 12.6 10*3/uL — ABNORMAL HIGH (ref 4.0–10.5)
nRBC: 0.7 % — ABNORMAL HIGH (ref 0.0–0.2)
nRBC: 1.4 % — ABNORMAL HIGH (ref 0.0–0.2)

## 2022-06-25 LAB — HEPATIC FUNCTION PANEL
ALT: 16 U/L (ref 0–44)
AST: 52 U/L — ABNORMAL HIGH (ref 15–41)
Albumin: 3.1 g/dL — ABNORMAL LOW (ref 3.5–5.0)
Alkaline Phosphatase: 112 U/L (ref 38–126)
Bilirubin, Direct: 0.9 mg/dL — ABNORMAL HIGH (ref 0.0–0.2)
Indirect Bilirubin: 1 mg/dL — ABNORMAL HIGH (ref 0.3–0.9)
Total Bilirubin: 1.9 mg/dL — ABNORMAL HIGH (ref 0.3–1.2)
Total Protein: 7.5 g/dL (ref 6.5–8.1)

## 2022-06-25 LAB — GLUCOSE, CAPILLARY
Glucose-Capillary: 100 mg/dL — ABNORMAL HIGH (ref 70–99)
Glucose-Capillary: 115 mg/dL — ABNORMAL HIGH (ref 70–99)
Glucose-Capillary: 117 mg/dL — ABNORMAL HIGH (ref 70–99)
Glucose-Capillary: 125 mg/dL — ABNORMAL HIGH (ref 70–99)
Glucose-Capillary: 133 mg/dL — ABNORMAL HIGH (ref 70–99)
Glucose-Capillary: 136 mg/dL — ABNORMAL HIGH (ref 70–99)

## 2022-06-25 LAB — BASIC METABOLIC PANEL
Anion gap: 6 (ref 5–15)
BUN: 15 mg/dL (ref 6–20)
CO2: 17 mmol/L — ABNORMAL LOW (ref 22–32)
Calcium: 9.3 mg/dL (ref 8.9–10.3)
Chloride: 113 mmol/L — ABNORMAL HIGH (ref 98–111)
Creatinine, Ser: 0.77 mg/dL (ref 0.44–1.00)
GFR, Estimated: >60 mL/min (ref >60)
Glucose, Bld: 119 mg/dL — ABNORMAL HIGH (ref 70–99)
Potassium: 3.8 mmol/L (ref 3.5–5.1)
Sodium: 136 mmol/L (ref 135–145)

## 2022-06-25 LAB — PROTIME-INR
INR: 1.5 — ABNORMAL HIGH (ref 0.8–1.2)
Prothrombin Time: 18.4 seconds — ABNORMAL HIGH (ref 11.4–15.2)

## 2022-06-25 LAB — HEMOGLOBIN AND HEMATOCRIT, BLOOD
HCT: 30.4 % — ABNORMAL LOW (ref 36.0–46.0)
Hemoglobin: 8.6 g/dL — ABNORMAL LOW (ref 12.0–15.0)

## 2022-06-25 LAB — MAGNESIUM: Magnesium: 1.9 mg/dL (ref 1.7–2.4)

## 2022-06-25 LAB — PHOSPHORUS: Phosphorus: 3.2 mg/dL (ref 2.5–4.6)

## 2022-06-25 MED ORDER — ORAL CARE MOUTH RINSE: 15.0000 mL | OROMUCOSAL | Status: DC | PRN

## 2022-06-25 MED ORDER — PANTOPRAZOLE SODIUM 40 MG IV SOLR
40.0000 mg | Freq: Two times a day (BID) | INTRAVENOUS | Status: DC
  Administered 2022-06-28 – 2022-06-29 (×3): 40 mg via INTRAVENOUS
  Filled 2022-06-25 (×3): qty 10

## 2022-06-25 MED ORDER — SODIUM CHLORIDE 0.9 % IV SOLN
50.0000 ug/h | INTRAVENOUS | Status: DC
  Administered 2022-06-25 – 2022-06-28 (×8): 50 ug/h via INTRAVENOUS
  Filled 2022-06-25 (×11): qty 1

## 2022-06-25 MED ORDER — SODIUM CHLORIDE 0.9 % IV SOLN
3.0000 g | Freq: Four times a day (QID) | INTRAVENOUS | Status: AC
  Administered 2022-06-25 – 2022-06-27 (×10): 3 g via INTRAVENOUS
  Filled 2022-06-25 (×10): qty 8

## 2022-06-25 MED ORDER — PIPERACILLIN-TAZOBACTAM 3.375 G IVPB
3.3750 g | Freq: Three times a day (TID) | INTRAVENOUS | Status: DC
  Administered 2022-06-25: 3.375 g via INTRAVENOUS
  Filled 2022-06-25: qty 50

## 2022-06-25 MED ORDER — PIPERACILLIN-TAZOBACTAM 3.375 G IVPB 30 MIN: 3.3750 g | Freq: Four times a day (QID) | INTRAVENOUS | Status: DC

## 2022-06-25 MED ORDER — ORAL CARE MOUTH RINSE
15.0000 mL | OROMUCOSAL | Status: DC
  Administered 2022-06-25 – 2022-06-28 (×13): 15 mL via OROMUCOSAL

## 2022-06-25 MED ORDER — PANTOPRAZOLE INFUSION (NEW) - SIMPLE MED
8.0000 mg/h | INTRAVENOUS | Status: AC
  Administered 2022-06-25 – 2022-06-28 (×7): 8 mg/h via INTRAVENOUS
  Filled 2022-06-25: qty 100
  Filled 2022-06-25: qty 80
  Filled 2022-06-25: qty 100
  Filled 2022-06-25 (×5): qty 80

## 2022-06-25 MED ORDER — OCTREOTIDE LOAD VIA INFUSION
50.0000 ug | Freq: Once | INTRAVENOUS | Status: AC
  Administered 2022-06-25: 50 ug via INTRAVENOUS
  Filled 2022-06-25: qty 25

## 2022-06-25 MED ORDER — PANTOPRAZOLE 80MG IVPB - SIMPLE MED
80.0000 mg | Freq: Once | INTRAVENOUS | Status: AC
  Administered 2022-06-25: 80 mg via INTRAVENOUS
  Filled 2022-06-25: qty 80

## 2022-06-25 NOTE — Progress Notes (Addendum)
Progress Note  Primary GI: Dr. 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  Overnight patient pulled out core track partially, the primary team tried to reinsert but was unable to. Patient began to have coughing, episodes of emesis per nurse, inserted NG tube this morning currently with 800 cc dark blood/some coffee-ground emesis. Remains on 3 L nasal cannula but has some worsening breath sounds, concerns for possible aspiration. Patient has not received lactulose last night, currently nurse is going through NG tube and pausing for 30 minutes at a time. Continues to have altered mental status, yelling out in bed.    Objective   Vital signs in last 24 hours: Temp:  [97.6 F (36.4 C)-98.6 F (37 C)] 98.1 F (36.7 C) (10/16 0800) Pulse Rate:  [47-112] 91 (10/16 0800) Resp:  [14-34] 24 (10/16 0800) BP: (77-158)/(45-113) 81/45 (10/16 0800) SpO2:  [78 %-100 %] 95 % (10/16 0800) Weight:  [81.2 kg] 81.2 kg (10/16 0500) Last BM Date : 06/24/22 Last BM recorded by nurses in past 5 days Stool Type: Type 7 (Liquid consistency with no solid pieces) (06/24/2022 11:00 PM)  General: Joann [name] ill-appearing African-American female, NG tube in place with 800 cc bloody/coffee-ground  Heart:  Regular rate and rhythm; no murmurs Pulm: Rhonchi, coarse breath sounds, 3 L nasal cannula Abdomen:  Soft,  distended  AB, Active bowel sounds. No tenderness obvious but patient with AMS No organomegaly appreciated.  No shifting dullness or fluid wave appreciated. Extremities:  without  edema. Neurologic: Patient with altered mental status, crying out in bed, not able to answer any questions, positive clonus appreciated.  Intake/Output from previous day: 10/15 0701 - 10/16 0700 In: 3316.6 [I.V.:2135.1; NG/GT:641.7; IV Piggyback:439.9] Out: 750 [Urine:650; Stool:100] Intake/Output this  shift: Total I/O In: 113.6 [I.V.:113.6] Out: -   Studies/Results: DG Abd 1 View  Result Date: 06/25/2022 CLINICAL DATA:  NG tube placement EXAM: ABDOMEN - 1 VIEW COMPARISON:  06/25/2022 FINDINGS: Nasogastric tube in the stomach. Gaseous distension of the: Unchanged compared with 06/25/2022. No small bowel dilatation. No evidence of pneumoperitoneum, portal venous gas or pneumatosis. No pathologic calcifications along the expected course of the ureters. No acute osseous abnormality. IMPRESSION: 1. Nasogastric tube in the stomach. Electronically Signed   By: Kathreen Devoid M.D.   On: 06/25/2022 09:10   DG Abd 1 View  Result Date: 06/25/2022 CLINICAL DATA:  40 year old female status post nasogastric tube placement. EXAM: ABDOMEN - 1 VIEW COMPARISON:  06/24/2022. FINDINGS: Previously noted small bore feeding tube has been partially withdrawn, now with tip in the distal esophagus just before the gastroesophageal junction. Surgical clips project over the right upper quadrant of the abdomen, likely from prior cholecystectomy. Gaseous distention is noted in portions of the transverse colon. Lower abdomen is incompletely imaged. IMPRESSION: 1. Tip of small bore feeding tube is in the distal esophagus shortly before the gastroesophageal junction. Electronically Signed   By: Vinnie Langton M.D.   On: 06/25/2022 05:30   DG Abd Portable 1V  Result Date: 06/24/2022 CLINICAL DATA:  Ileus EXAM: PORTABLE ABDOMEN - 1 VIEW COMPARISON:  06/19/2022 FINDINGS: Soft feeding tube enters the stomach with its tip at the body antrum junction. Small bowel gas pattern shows a few gas-filled loops. There is a moderate amount of gas in the colon. Findings could be due to a mild generalized ileus. No advanced finding. IMPRESSION: Soft feeding tube tip at the body  antrum junction. Mild generalized ileus pattern. No advanced finding. Electronically Signed   By: Nelson Chimes M.D.   On: 06/24/2022 10:50    Lab Results: Recent Labs     06/23/22 0455 06/24/22 0545 06/25/22 0544  WBC 4.3 6.2 10.7*  HGB 9.1* 9.4* 10.1*  HCT 31.9* 33.3* 37.1  PLT 27* 33* 87*   BMET Recent Labs    06/23/22 0455 06/24/22 0545 06/25/22 0544  NA 141 139 136  K 3.4* 3.7 3.8  CL 111 112* 113*  CO2 24 23 17*  GLUCOSE 146* 181* 119*  BUN 7 8 15   CREATININE 0.33* <0.30* 0.77  CALCIUM 8.7* 8.7* 9.3   LFT Recent Labs    06/25/22 0544  PROT 7.5  ALBUMIN 3.1*  AST 52*  ALT 16  ALKPHOS 112  BILITOT 1.9*  BILIDIR 0.9*  IBILI 1.0*   PT/INR Recent Labs    06/24/22 0545 06/25/22 0450  LABPROT 18.9* 18.4*  INR 1.6* 1.5*     Scheduled Meds:  Chlorhexidine Gluconate Cloth  6 each Topical Q2200   feeding supplement (PROSource TF20)  60 mL Per Tube Daily   folic acid  1 mg Per Tube Daily   lactulose  30 g Per Tube Q4H   pantoprazole  40 mg Per Tube Daily   rifaximin  550 mg Per Tube BID   sodium chloride flush  10-40 mL Intracatheter Q12H   thiamine  100 mg Per Tube Daily   Or   thiamine  100 mg Intravenous Daily   zinc sulfate  220 mg Per Tube Daily   Continuous Infusions:  sodium chloride 10 mL/hr at 06/25/22 0800   dexmedetomidine (PRECEDEX) IV infusion 1.4 mcg/kg/hr (06/25/22 0800)   feeding supplement (OSMOLITE 1.5 CAL) Stopped (06/24/22 1200)   lactated ringers 50 mL/hr at 06/25/22 0800   phenylephrine (NEO-SYNEPHRINE) Adult infusion 40 mcg/min (06/25/22 0800)   piperacillin-tazobactam (ZOSYN)  IV 3.375 g (06/25/22 1000)      Patient profile:   41 year old female with history of alcohol induced cirrhosis, portal hypertension with PCOS and prior EGD, prior episodes of alcoholic pancreatitis, continued alcohol use disorder complicated by withdrawals presented alcohol withdrawals, alcoholic hepatitis, hepatic encephalopathy.   Impression/Plan:   Decompensated cirrhosis secondary to ETOH MELD-Na: 14 ( was 18 on admission) Day 6 of steroids, steroids stopped today due to possible aspiration pneumonia and MSSA  bacteremia.  Showed possible improvement however with current ongoing unable to  restart. -Daily CBC, CMET, INR -continue CIWA protocol -Empiric treatment with folic acid, multivitamin and thiamine.  --No role for liver transplantation given ongoing alcohol use  Blood/coffee-ground emesis in NG-tube after pulling out core track 12/2019 EGD with small varices Hgb 10.1 this morning Possible trauma from trying to reinsert core track, however with patient's history of esophageal varices would suggest starting patient prophylactically on octreotide and PPI. We will get stat CBC and trend every 6 hrs x 2 -Continue volume resuscitation with Hgb goal >7 (higher Hgb rates have been associated with worsened mortality in the setting of portal hypertensive variceal bleeds); unless active bleeding is occurring On unasyn for possible aspiration pneumonia Due to patient's AMS and respiratory status we will try to hold off on doing EGD, if patient has drop in hemoglobin significantly, continues to have coffee-ground emesis will need to be intubated for EGD.   Hepatic Encephalopathy:  06/24/2022 Ammonia 166 - Lactulose 33m per tube q 4 hours, consider adding on enemas 200 gram twice 8 hours apart  - Rifaximin  541m bid per tube - continue zinc per tube - minimize/remove all benzos and narcotics this is difficult in setting of alcohol withdrawal and agitation  MSSA bacteremia with possible aspiration pneumonia Possible contaminate but ID recommended 2-week course total, cefazolin stopped 10/16  piperacillin/tazobactam  recently added due to concern for aspiration pneumonia, ID switched to unasyn .  Afebrile, increase WOB, worse breath sounds. 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 I agree with the Advanced Practitioner's note, impression and recommendations, and have recorded additional findings, impressions and recommendations below. I performed a substantive portion of this encounter (>50% time spent), including a complete performance of the medical decision making.  My additional thoughts are as follows:  Signout received from Dr. MRush Landmark extensive chart review performed, patient seen and examined.  Discussed with patient's nurse as well as my PA.  She continues to have altered mental status from alcohol withdrawal and hepatic encephalopathy.  Clinical development today was new onset upper GI bleeding after placement of NG tube because patient had removed her core track overnight. There is a half canister of black fluid in the suction collection, and her nurse says there was another full one prior to that earlier in the day.  However, she states that the volume of that fluid coming out has significantly decreased in the last couple of hours.  This patient is known to have esophageal varices from an endoscopy in 2021, and variceal bleeding could have been precipitated by placement of the NG tube.  She might also have severe friable gastropathy from alcohol abuse, portal hypertension and critical illness.  Thus far, she remains hemodynamically stable.  Hemoglobin was 9.4 yesterday morning, then 10.1 this morning, then 8.9 today.  So there seems to be some equilibration, but that notwithstanding, there has been some decrease in hemoglobin.  Curiously, the platelet count of 33,000 yesterday was 87,000 this morning and 119 on the last check  that was drawn at 1130 this morning and resulted at 2 PM. Protonix octreotide drips were started this morning after this bleeding occurred.  This patient seems likely to need an upper endoscopy, though the sedation for that will be a significant challenge due to her altered mental status and critical illness.  She would need to be intubated for an upper endoscopy for airway protection and adequate sedation.  Critical care service is understandably concerned about her going back on the vent because it was hard to get her off it, but if there is ongoing bleeding requiring endoscopic intervention, it will be necessary.  I ordered a CBC to be drawn now, her nurse will check up on it and alert providers if there is a significant change.  I will also sinus patient out to our overnight physician Dr. DLorenso Courierin case her assistance is needed. HNelida MeuseIII Office:[phone]

## 2022-06-25 NOTE — Progress Notes (Signed)
[facility]   Reason for visit: Follow up on positive blood culture  Interval History: pulled out coretrak; WBC 10.7, remains afebrile. Broadened to piperacillin/tazobactam by ccm with concern for aspiration.  CXR with hazy opacity of the right upper and lower lung Day 7 total antibiotics  Physical Exam: Constitutional:  Vitals:   06/25/22 1145 06/25/22 1200  BP: 118/66 (!) 103/59  Pulse: 96 100  Resp: (!) 25 (!) 21  Temp:    SpO2: 94% 96%   patient appears in NAD Respiratory: Normal respiratory effort  Review of Systems: Unable to be assessed due to mental status  Lab Results  Component Value Date   WBC 10.7 (H) 06/25/2022   HGB 10.1 (L) 06/25/2022   HCT 37.1 06/25/2022   MCV 85.7 06/25/2022   PLT 87 (L) 06/25/2022    Lab Results  Component Value Date   CREATININE 0.77 06/25/2022   BUN 15 06/25/2022   NA 136 06/25/2022   K 3.8 06/25/2022   CL 113 (H) 06/25/2022   CO2 17 (L) 06/25/2022    Lab Results  Component Value Date   ALT 16 06/25/2022   AST 52 (H) 06/25/2022   ALKPHOS 112 06/25/2022     Microbiology: Recent Results (from the past 240 hour(s))  Blood culture (routine x 2)     Status: None   Collection Time: 06/18/22  8:40 PM   Specimen: BLOOD  Result Value Ref Range Status   Specimen Description   Final    BLOOD BLOOD LEFT ARM Performed at [facility], [place] [address] [place], [place] [address]    Special Requests   Final    BOTTLES DRAWN AEROBIC AND ANAEROBIC Blood Culture results may not be optimal due to an inadequate volume of blood received in culture bottles Performed at [facility] [address] [place], [place] [address]    Culture   Final    NO GROWTH 5 DAYS Performed at [facility], [place] [address] [place], [place] [address]    Report Status 06/24/2022 FINAL  Final  Blood culture (routine x 2)     Status: Abnormal   Collection Time: 06/18/22 10:34 PM    Specimen: BLOOD  Result Value Ref Range Status   Specimen Description   Final    BLOOD RIGHT ANTECUBITAL Performed at [facility], [place] [address] [place], [place] [address]    Special Requests   Final    Blood Culture adequate volume BOTTLES DRAWN AEROBIC AND ANAEROBIC Performed at [facility] [address] [place], [place] [address]    Culture  Setup Time   Final    GRAM POSITIVE COCCI IN PAIRS BOTTLES DRAWN AEROBIC ONLY CRITICAL RESULT CALLED TO, READ BACK BY AND VERIFIED WITH: PHARMD NICK GLOGOVAC ON 06/19/22 @ 1734 BY DRT   Performed at [facility], [place] [address], [place], [place] [address]    Culture STAPHYLOCOCCUS AUREUS (A)  Final   Report Status 06/21/2022 FINAL  Final   Organism ID, Bacteria STAPHYLOCOCCUS AUREUS  Final      Susceptibility   Staphylococcus aureus - MIC*    CIPROFLOXACIN <=0.5 SENSITIVE Sensitive     ERYTHROMYCIN <=0.25 SENSITIVE Sensitive     GENTAMICIN <=0.5 SENSITIVE Sensitive     OXACILLIN <=0.25 SENSITIVE Sensitive     TETRACYCLINE <=1 SENSITIVE Sensitive     VANCOMYCIN 1 SENSITIVE Sensitive     TRIMETH/SULFA <=10 SENSITIVE  Sensitive     CLINDAMYCIN <=0.25 SENSITIVE Sensitive     RIFAMPIN <=0.5 SENSITIVE Sensitive     Inducible Clindamycin NEGATIVE Sensitive     * STAPHYLOCOCCUS AUREUS  Blood Culture ID Panel (Reflexed)     Status: Abnormal   Collection Time: 06/18/22 10:34 PM  Result Value Ref Range Status   Enterococcus faecalis NOT DETECTED NOT DETECTED Final   Enterococcus Faecium NOT DETECTED NOT DETECTED Final   Listeria monocytogenes NOT DETECTED NOT DETECTED Final   Staphylococcus species DETECTED (A) NOT DETECTED Final    Comment: CRITICAL RESULT CALLED TO, READ BACK BY AND VERIFIED WITH: PHARMD NICK GLOGOVAC ON 06/19/22 @ 1734 BY DRT      Staphylococcus aureus (BCID) DETECTED (A) NOT DETECTED Final    Comment: CRITICAL RESULT CALLED TO, READ BACK BY AND VERIFIED WITH: PHARMD NICK GLOGOVAC ON  06/19/22 @ 1734 BY DRT      Staphylococcus epidermidis NOT DETECTED NOT DETECTED Final   Staphylococcus lugdunensis NOT DETECTED NOT DETECTED Final   Streptococcus species NOT DETECTED NOT DETECTED Final   Streptococcus agalactiae NOT DETECTED NOT DETECTED Final   Streptococcus pneumoniae NOT DETECTED NOT DETECTED Final   Streptococcus pyogenes NOT DETECTED NOT DETECTED Final   A.calcoaceticus-baumannii NOT DETECTED NOT DETECTED Final   Bacteroides fragilis NOT DETECTED NOT DETECTED Final   Enterobacterales NOT DETECTED NOT DETECTED Final   Enterobacter cloacae complex NOT DETECTED NOT DETECTED Final   Escherichia coli NOT DETECTED NOT DETECTED Final   Klebsiella aerogenes NOT DETECTED NOT DETECTED Final   Klebsiella oxytoca NOT DETECTED NOT DETECTED Final   Klebsiella pneumoniae NOT DETECTED NOT DETECTED Final   Proteus species NOT DETECTED NOT DETECTED Final   Salmonella species NOT DETECTED NOT DETECTED Final   Serratia marcescens NOT DETECTED NOT DETECTED Final   Haemophilus influenzae NOT DETECTED NOT DETECTED Final   Neisseria meningitidis NOT DETECTED NOT DETECTED Final   Pseudomonas aeruginosa NOT DETECTED NOT DETECTED Final   Stenotrophomonas maltophilia NOT DETECTED NOT DETECTED Final   Candida albicans NOT DETECTED NOT DETECTED Final   Candida auris NOT DETECTED NOT DETECTED Final   Candida glabrata NOT DETECTED NOT DETECTED Final   Candida krusei NOT DETECTED NOT DETECTED Final   Candida parapsilosis NOT DETECTED NOT DETECTED Final   Candida tropicalis NOT DETECTED NOT DETECTED Final   Cryptococcus neoformans/gattii NOT DETECTED NOT DETECTED Final   Meth resistant mecA/C and MREJ NOT DETECTED NOT DETECTED Final    Comment: Performed at [facility], [address] [place], [place] [address]  MRSA Next Gen by PCR, Nasal     Status: None   Collection Time: 06/19/22  4:02 AM   Specimen: Nasal Mucosa; Nasal Swab  Result Value Ref Range Status   MRSA by PCR Next Gen  NOT DETECTED NOT DETECTED Final    Comment: (NOTE) The GeneXpert MRSA Assay (FDA approved for NASAL specimens only), is one component of a comprehensive MRSA colonization surveillance program. It is not intended to diagnose MRSA infection nor to guide or monitor treatment for MRSA infections. Test performance is not FDA approved in patients less than 45 years old. Performed at [facility], [place] [address] [place], [place] [address]   Culture, blood (Routine X 2) w Reflex to ID Panel     Status: None (Preliminary result)   Collection Time: 06/21/22  6:44 AM   Specimen: BLOOD LEFT HAND  Result Value Ref Range Status   Specimen Description   Final  BLOOD LEFT HAND Performed at [facility] [address], [place], [place] [address]    Special Requests   Final    BOTTLES DRAWN AEROBIC AND ANAEROBIC Blood Culture adequate volume Performed at [place] [address], [place], [place] [address]    Culture   Final    NO GROWTH 4 DAYS Performed at [facility], [place] [address], [place], [place] [address]    Report Status PENDING  Incomplete  Culture, blood (Routine X 2) w Reflex to ID Panel     Status: None (Preliminary result)   Collection Time: 06/21/22  6:53 AM   Specimen: BLOOD RIGHT HAND  Result Value Ref Range Status   Specimen Description   Final    BLOOD RIGHT HAND Performed at [facility] [address], [place], [place] [address]    Special Requests   Final    BOTTLES DRAWN AEROBIC AND ANAEROBIC Blood Culture adequate volume Performed at [facility] [address], [place], [place] [address]    Culture   Final    NO GROWTH 4 DAYS Performed at [facility], [place] [address] [place], [place] [address]    Report Status PENDING  Incomplete    Impression/Plan:  1. Positive blood culture - positive Staph aureus in 1 bottle still with no new growth in other  cultures.  I suspect this is a contaminate but with Staph aureus, I recommend continuing with MSSA coverage for 2 weeks total.  No TEE indicated in this case.    2.  Aspiration pneumonia - concern for aspiration with xray findings and increased wob along with the history of poor response, pulling out coretrak.  Antibiotics broadened and can use ampicillin/sulbactam in place of piperacillin/tazobactam for good coverage for problem # 1 and 2.    3.  Alcoholic hepatitis - LFTs trending down.  Will continue to monitor.

## 2022-06-25 NOTE — Progress Notes (Signed)
[facility] Progress Note Patient Name: KINSEY KARCH DOB: Nov 04, 1980 MRN: [id number]   Date of Service  06/25/2022  HPI/Events of Note  Agitation - Nursing request to renew restraint orders.   eICU Interventions  Will renew restraint orders X 12 hours.      Intervention Category Major Interventions: Delirium, psychosis, severe agitation - evaluation and management  Brienne Liguori Eugene 06/25/2022, 9:20 PM

## 2022-06-25 NOTE — Plan of Care (Addendum)
Discussed in front of patient plan of care for the evening, pain management and NG tube with no evidence of learning at this time.  Will continue to monitor for aspiration precautions and sepsis protocols.  Problem: Education: Goal: Knowledge of General Education information will improve Description: Including pain rating scale, medication(s)/side effects and non-pharmacologic comfort measures 06/25/2022 1943 by Jannette Fogo, RN Outcome: Not Progressing 06/25/2022 1943 by Jannette Fogo, RN Outcome: Progressing   Problem: Health Behavior/Discharge Planning: Goal: Ability to manage health-related needs will improve 06/25/2022 1943 by Jannette Fogo, RN Outcome: Not Progressing 06/25/2022 1943 by Jannette Fogo, RN Outcome: Progressing
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
## 2022-06-25 NOTE — Progress Notes (Signed)
Patient had coffee ground emesis and core track came out slightly.  With the aide of Harriette Bouillon we re-taped and placed back at 37 number.  PCCM contacted and STAT XR done to verify placement for any future medications.

## 2022-06-25 NOTE — TOC Progression Note (Signed)
Transition of Care [facility]) - Progression Note    Patient Details  Name: Joann [name] MRN: [id number] Date of Birth: October 19, 1980  Transition [facility]) CM/SW Contact  Leeroy Cha, RN Phone Number: 06/25/2022, 7:39 AM  Clinical Narrative:    [id number]/ chart reviewed.  Following for toc needs.  Plan is to return home with self-care currently.        Expected Discharge Plan and Services                                                 Social Determinants of Health (SDOH) Interventions    Readmission Risk Interventions   Row Labels 02/20/2022    2:32 PM 02/19/2022   10:32 AM  Readmission Risk Prevention Plan   Section Header. No data exists in this row.    Transportation Screening      PCP or Specialist Appt within 3-5 Days      [place] or [place] for [place] Planning/Counseling      [place] Screening      Medication Review Press photographer)         Information is confidential and restricted. Go to Review Flowsheets to unlock data.

## 2022-06-26 ENCOUNTER — Encounter (HOSPITAL_COMMUNITY)
Admission: EM | Disposition: A | Discharge status: Home / Self Care | Payer: Self-pay | Source: Home / Self Care | Attending: Internal Medicine

## 2022-06-26 ENCOUNTER — Encounter (HOSPITAL_COMMUNITY): Payer: Self-pay | Admitting: Critical Care Medicine

## 2022-06-26 ENCOUNTER — Inpatient Hospital Stay (HOSPITAL_COMMUNITY): Payer: Medicaid Other

## 2022-06-26 DIAGNOSIS — I8510 Secondary esophageal varices without bleeding: Secondary | ICD-10-CM

## 2022-06-26 DIAGNOSIS — K2210 Ulcer of esophagus without bleeding: Secondary | ICD-10-CM | POA: Diagnosis present

## 2022-06-26 DIAGNOSIS — G9340 Encephalopathy, unspecified: Secondary | ICD-10-CM | POA: Diagnosis not present

## 2022-06-26 DIAGNOSIS — K3189 Other diseases of stomach and duodenum: Secondary | ICD-10-CM | POA: Diagnosis present

## 2022-06-26 HISTORY — PX: ESOPHAGOGASTRODUODENOSCOPY (EGD) WITH PROPOFOL: SHX5813

## 2022-06-26 LAB — CULTURE, BLOOD (ROUTINE X 2)
Culture: NO GROWTH
Culture: NO GROWTH
Special Requests: ADEQUATE
Special Requests: ADEQUATE

## 2022-06-26 LAB — COMPREHENSIVE METABOLIC PANEL
ALT: 13 U/L (ref 0–44)
AST: 47 U/L — ABNORMAL HIGH (ref 15–41)
Albumin: 2.3 g/dL — ABNORMAL LOW (ref 3.5–5.0)
Alkaline Phosphatase: 77 U/L (ref 38–126)
Anion gap: 4 — ABNORMAL LOW (ref 5–15)
BUN: 10 mg/dL (ref 6–20)
CO2: 19 mmol/L — ABNORMAL LOW (ref 22–32)
Calcium: 7.3 mg/dL — ABNORMAL LOW (ref 8.9–10.3)
Chloride: 118 mmol/L — ABNORMAL HIGH (ref 98–111)
Creatinine, Ser: 0.5 mg/dL (ref 0.44–1.00)
GFR, Estimated: >60 mL/min (ref >60)
Glucose, Bld: 117 mg/dL — ABNORMAL HIGH (ref 70–99)
Potassium: 3.5 mmol/L (ref 3.5–5.1)
Sodium: 141 mmol/L (ref 135–145)
Total Bilirubin: 2 mg/dL — ABNORMAL HIGH (ref 0.3–1.2)
Total Protein: 5.6 g/dL — ABNORMAL LOW (ref 6.5–8.1)

## 2022-06-26 LAB — GLUCOSE, CAPILLARY
Glucose-Capillary: 117 mg/dL — ABNORMAL HIGH (ref 70–99)
Glucose-Capillary: 118 mg/dL — ABNORMAL HIGH (ref 70–99)
Glucose-Capillary: 123 mg/dL — ABNORMAL HIGH (ref 70–99)
Glucose-Capillary: 126 mg/dL — ABNORMAL HIGH (ref 70–99)
Glucose-Capillary: 133 mg/dL — ABNORMAL HIGH (ref 70–99)
Glucose-Capillary: 93 mg/dL (ref 70–99)

## 2022-06-26 LAB — BASIC METABOLIC PANEL
Anion gap: 5 (ref 5–15)
BUN: 7 mg/dL (ref 6–20)
CO2: 20 mmol/L — ABNORMAL LOW (ref 22–32)
Calcium: 7.7 mg/dL — ABNORMAL LOW (ref 8.9–10.3)
Chloride: 119 mmol/L — ABNORMAL HIGH (ref 98–111)
Creatinine, Ser: 0.32 mg/dL — ABNORMAL LOW (ref 0.44–1.00)
GFR, Estimated: >60 mL/min (ref >60)
Glucose, Bld: 119 mg/dL — ABNORMAL HIGH (ref 70–99)
Potassium: 3.5 mmol/L (ref 3.5–5.1)
Sodium: 144 mmol/L (ref 135–145)

## 2022-06-26 LAB — MAGNESIUM
Magnesium: 1.2 mg/dL — ABNORMAL LOW (ref 1.7–2.4)
Magnesium: 1.5 mg/dL — ABNORMAL LOW (ref 1.7–2.4)

## 2022-06-26 LAB — HEMOGLOBIN AND HEMATOCRIT, BLOOD
HCT: 27.3 % — ABNORMAL LOW (ref 36.0–46.0)
HCT: 25.6 % — ABNORMAL LOW (ref 36.0–46.0)
HCT: 26.3 % — ABNORMAL LOW (ref 36.0–46.0)
HCT: 24.1 % — ABNORMAL LOW (ref 36.0–46.0)
Hemoglobin: 7.6 g/dL — ABNORMAL LOW (ref 12.0–15.0)
Hemoglobin: 7.1 g/dL — ABNORMAL LOW (ref 12.0–15.0)
Hemoglobin: 7.3 g/dL — ABNORMAL LOW (ref 12.0–15.0)
Hemoglobin: 6.6 g/dL — CL (ref 12.0–15.0)

## 2022-06-26 LAB — PROTIME-INR
INR: 1.6 — ABNORMAL HIGH (ref 0.8–1.2)
Prothrombin Time: 18.8 seconds — ABNORMAL HIGH (ref 11.4–15.2)

## 2022-06-26 LAB — CBC
HCT: 24.9 % — ABNORMAL LOW (ref 36.0–46.0)
Hemoglobin: 7 g/dL — ABNORMAL LOW (ref 12.0–15.0)
MCH: 24.2 pg — ABNORMAL LOW (ref 26.0–34.0)
MCHC: 28.1 g/dL — ABNORMAL LOW (ref 30.0–36.0)
MCV: 86.2 fL (ref 80.0–100.0)
Platelets: 73 10*3/uL — ABNORMAL LOW (ref 150–400)
RBC: 2.89 MIL/uL — ABNORMAL LOW (ref 3.87–5.11)
RDW: 29.9 % — ABNORMAL HIGH (ref 11.5–15.5)
WBC: 6.1 10*3/uL (ref 4.0–10.5)
nRBC: 0.3 % — ABNORMAL HIGH (ref 0.0–0.2)

## 2022-06-26 LAB — PREPARE RBC (CROSSMATCH)

## 2022-06-26 LAB — AMMONIA: Ammonia: 136 umol/L — ABNORMAL HIGH (ref 9–35)

## 2022-06-26 SURGERY — ESOPHAGOGASTRODUODENOSCOPY (EGD) WITH PROPOFOL
Anesthesia: Moderate Sedation

## 2022-06-26 MED ORDER — FENTANYL CITRATE (PF) 100 MCG/2ML IJ SOLN
INTRAMUSCULAR | Status: AC
  Filled 2022-06-26: qty 4

## 2022-06-26 MED ORDER — MAGNESIUM SULFATE 4 GM/100ML IV SOLN
4.0000 g | Freq: Once | INTRAVENOUS | Status: AC
  Administered 2022-06-26: 4 g via INTRAVENOUS
  Filled 2022-06-26: qty 100

## 2022-06-26 MED ORDER — FENTANYL CITRATE (PF) 100 MCG/2ML IJ SOLN
INTRAMUSCULAR | Status: DC | PRN
  Administered 2022-06-26: 25 ug via INTRAVENOUS
  Administered 2022-06-26: 50 ug via INTRAVENOUS

## 2022-06-26 MED ORDER — METOCLOPRAMIDE HCL 5 MG/ML IJ SOLN
5.0000 mg | Freq: Once | INTRAMUSCULAR | Status: AC
  Administered 2022-06-26: 5 mg via INTRAVENOUS
  Filled 2022-06-26: qty 2

## 2022-06-26 MED ORDER — MAGNESIUM SULFATE 2 GM/50ML IV SOLN
2.0000 g | Freq: Once | INTRAVENOUS | Status: AC
  Administered 2022-06-26: 2 g via INTRAVENOUS
  Filled 2022-06-26: qty 50

## 2022-06-26 MED ORDER — MIDAZOLAM HCL 2 MG/2ML IJ SOLN
INTRAMUSCULAR | Status: DC | PRN
  Administered 2022-06-26: 1 mg via INTRAVENOUS
  Administered 2022-06-26: 2 mg via INTRAVENOUS

## 2022-06-26 MED ORDER — ALBUMIN HUMAN 25 % IV SOLN
25.0000 g | Freq: Four times a day (QID) | INTRAVENOUS | Status: AC
  Administered 2022-06-26 (×2): 25 g via INTRAVENOUS
  Filled 2022-06-26 (×2): qty 100

## 2022-06-26 MED ORDER — SODIUM CHLORIDE 0.9% IV SOLUTION: Freq: Once | INTRAVENOUS | Status: DC

## 2022-06-26 MED ORDER — MIDAZOLAM HCL (PF) 5 MG/ML IJ SOLN
INTRAMUSCULAR | Status: AC
  Filled 2022-06-26: qty 2

## 2022-06-26 MED ORDER — SODIUM CHLORIDE 0.9 % IV SOLN: INTRAVENOUS | Status: DC

## 2022-06-26 MED ORDER — POTASSIUM CHLORIDE 20 MEQ PO PACK
40.0000 meq | PACK | Freq: Once | ORAL | Status: AC
  Administered 2022-06-26: 40 meq
  Filled 2022-06-26: qty 2

## 2022-06-26 MED ORDER — LACTULOSE ENEMA
300.0000 mL | Freq: Three times a day (TID) | ORAL | Status: DC
  Administered 2022-06-26: 300 mL via RECTAL
  Filled 2022-06-26: qty 300

## 2022-06-26 MED ORDER — POTASSIUM CHLORIDE 10 MEQ/50ML IV SOLN
10.0000 meq | INTRAVENOUS | Status: AC
  Administered 2022-06-26 (×4): 10 meq via INTRAVENOUS
  Filled 2022-06-26 (×4): qty 50

## 2022-06-26 MED ORDER — BISACODYL 10 MG RE SUPP
10.0000 mg | Freq: Every day | RECTAL | Status: DC | PRN
  Filled 2022-06-26: qty 1

## 2022-06-26 SURGICAL SUPPLY — 15 items

## 2022-06-26 NOTE — Progress Notes (Addendum)
Progress Note  Primary GI: Dr. 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 Patient had a total of 1600 output from NG tube yesterday and 425 since 1900 this morning.  It is slowing down.  Not as dark as previous with more bilious appearance. Rectal pouch with only 50 mL output yesterday. Patient's been on Protonix and octreotide since yesterday. Still requiring 3 L nasal cannula, some improvement of work of breathing today. Per nurse and per physical exam slightly more responsive today, able to respond to simple questions, still not oriented to place or time and still very lethargic.    Objective   Vital signs in last 24 hours: Temp:  [98 F (36.7 C)-101 F (38.3 C)] 98 F (36.7 C) (10/17 0400) Pulse Rate:  [53-111] 66 (10/17 0700) Resp:  [18-32] 22 (10/17 0700) BP: (71-163)/(36-90) 111/66 (10/17 0700) SpO2:  [82 %-100 %] 99 % (10/17 0700) Weight:  [82 kg] 82 kg (10/17 0500) Last BM Date : 06/25/22 (little out of rectal tube) Last BM recorded by nurses in past 5 days Stool Type: Type 7 (Liquid consistency with no solid pieces) (06/26/2022  4:18 AM)  General: Chronically ill-appearing African-American female, NG tube in place with 600 cc bloody/bilious material Heart:  Regular rate and rhythm; no murmurs Pulm: Coarse breath sounds, slightly improved from yesterday, 3 L nasal cannula Abdomen:  Soft,  distended  AB,  sluggish  bowel sounds compared to yesterday. No tenderness obvious but patient with AMS No organomegaly appreciated.  No shifting dullness or fluid wave appreciated. Extremities:  without  edema. Neurologic: Patient with slightly improving altered mental status, able to respond to questions today, still rather sedated/lethargic, positive clonus appreciated.  Intake/Output from previous day: 10/16 0701 - 10/17 0700 In: 4490.9 [I.V.:3550.7;  NG/GT:385; IV Piggyback:545.2] Out: 3100 [Urine:1025; Emesis/NG output:2025; Stool:50] Intake/Output this shift: No intake/output data recorded.  Studies/Results: DG Chest 1 View  Result Date: 06/25/2022 CLINICAL DATA:  Cough EXAM: CHEST  1 VIEW COMPARISON:  06/19/2022 FINDINGS: Right-sided PICC line with the tip projecting over the cavoatrial junction. Nasogastric tube with the tip projecting over the stomach. Gaseous distension of the stomach. Hazy right upper and lower lung airspace disease concerning for pneumonia. No pleural effusion or pneumothorax. Heart and mediastinal contours are unremarkable. No acute osseous abnormality. IMPRESSION: 1. Hazy right upper and lower lung airspace disease concerning for pneumonia. 2. Nasogastric tube with the tip projecting over the stomach. 3. Right-sided PICC line with the tip projecting over the cavoatrial junction. Electronically Signed   By: Kathreen Devoid M.D.   On: 06/25/2022 11:08   DG Abd 1 View  Result Date: 06/25/2022 CLINICAL DATA:  NG tube placement EXAM: ABDOMEN - 1 VIEW COMPARISON:  06/25/2022 FINDINGS: Nasogastric tube in the stomach. Gaseous distension of the: Unchanged compared with 06/25/2022. No small bowel dilatation. No evidence of pneumoperitoneum, portal venous gas or pneumatosis. No pathologic calcifications along the expected course of the ureters. No acute osseous abnormality. IMPRESSION: 1. Nasogastric tube in the stomach. Electronically Signed   By: Kathreen Devoid M.D.   On: 06/25/2022 09:10   DG Abd 1 View  Result Date: 06/25/2022 CLINICAL DATA:  41 year old female status post nasogastric tube placement. EXAM: ABDOMEN - 1 VIEW COMPARISON:  06/24/2022. FINDINGS: Previously noted small bore feeding tube has been partially withdrawn, now with tip in the distal esophagus just before the gastroesophageal junction. Surgical clips project over  the right upper quadrant of the abdomen, likely from prior cholecystectomy. Gaseous distention  is noted in portions of the transverse colon. Lower abdomen is incompletely imaged. IMPRESSION: 1. Tip of small bore feeding tube is in the distal esophagus shortly before the gastroesophageal junction. Electronically Signed   By: Vinnie Langton M.D.   On: 06/25/2022 05:30   DG Abd Portable 1V  Result Date: 06/24/2022 CLINICAL DATA:  Ileus EXAM: PORTABLE ABDOMEN - 1 VIEW COMPARISON:  06/19/2022 FINDINGS: Soft feeding tube enters the stomach with its tip at the body antrum junction. Small bowel gas pattern shows a few gas-filled loops. There is a moderate amount of gas in the colon. Findings could be due to a mild generalized ileus. No advanced finding. IMPRESSION: Soft feeding tube tip at the body antrum junction. Mild generalized ileus pattern. No advanced finding. Electronically Signed   By: Nelson Chimes M.D.   On: 06/24/2022 10:50    Lab Results: Recent Labs    06/25/22 0544 06/25/22 1355 06/25/22 1653 06/26/22 0225  WBC 10.7* 12.6*  --  6.1  HGB 10.1* 8.9* 8.6* 7.0*  HCT 37.1 31.5* 30.4* 24.9*  PLT 87* 119*  --  73*   BMET Recent Labs    06/24/22 0545 06/25/22 0544 06/26/22 0225  NA 139 136 141  K 3.7 3.8 3.5  CL 112* 113* 118*  CO2 23 17* 19*  GLUCOSE 181* 119* 117*  BUN 8 15 10   CREATININE <0.30* 0.77 0.50  CALCIUM 8.7* 9.3 7.3*   LFT Recent Labs    06/25/22 0544 06/26/22 0225  PROT 7.5 5.6*  ALBUMIN 3.1* 2.3*  AST 52* 47*  ALT 16 13  ALKPHOS 112 77  BILITOT 1.9* 2.0*  BILIDIR 0.9*  --   IBILI 1.0*  --    PT/INR Recent Labs    06/24/22 0545 06/25/22 0450  LABPROT 18.9* 18.4*  INR 1.6* 1.5*     Scheduled Meds:  Chlorhexidine Gluconate Cloth  6 each Topical Q2200   feeding supplement (PROSource TF20)  60 mL Per Tube Daily   folic acid  1 mg Per Tube Daily   lactulose  30 g Per Tube Q4H   mouth rinse  15 mL Mouth Rinse 4 times per day   [START ON 06/28/2022] pantoprazole  40 mg Intravenous Q12H   rifaximin  550 mg Per Tube BID   sodium chloride  flush  10-40 mL Intracatheter Q12H   thiamine  100 mg Per Tube Daily   Or   thiamine  100 mg Intravenous Daily   zinc sulfate  220 mg Per Tube Daily   Continuous Infusions:  sodium chloride Stopped (06/26/22 0658)   albumin human     ampicillin-sulbactam (UNASYN) IV Stopped (06/26/22 0447)   dexmedetomidine (PRECEDEX) IV infusion 0.8 mcg/kg/hr (06/26/22 0755)   feeding supplement (OSMOLITE 1.5 CAL) Stopped (06/24/22 1200)   lactated ringers 50 mL/hr at 06/26/22 0700   octreotide (SANDOSTATIN) 500 mcg in sodium chloride 0.9 % 250 mL (2 mcg/mL) infusion 50 mcg/hr (06/26/22 0700)   pantoprazole 8 mg/hr (06/26/22 0700)   phenylephrine (NEO-SYNEPHRINE) Adult infusion 50 mcg/min (06/26/22 0700)   potassium chloride 10 mEq (06/26/22 0756)      Patient profile:   41 year old female with history of alcohol induced cirrhosis, portal hypertension with PCOS and prior EGD, prior episodes of alcoholic pancreatitis, continued alcohol use disorder complicated by withdrawals presented alcohol withdrawals, alcoholic hepatitis, hepatic encephalopathy.   Impression/Plan:   Decompensated cirrhosis secondary to ETOH MELD 3.0:  16 at 06/26/2022  2:25 AM MELD-Na: 14 at 06/26/2022  2:25 AM Calculated from: Serum Creatinine: 0.50 mg/dL (Using min of 1 mg/dL) at 06/26/2022  2:25 AM Serum Sodium: 141 mmol/L (Using max of 137 mmol/L) at 06/26/2022  2:25 AM Total Bilirubin: 2.0 mg/dL at 06/26/2022  2:25 AM Serum Albumin: 2.3 g/dL at 06/26/2022  2:25 AM INR(ratio): 1.5 at 06/25/2022  4:50 AM Age at listing (hypothetical): 41 years Sex: Female at 06/26/2022  2:25 AM  MELD-Na: 14 ( was 18 on admission) Steroids stopped day 6 due to possible aspiration pneumonia, it did show possible improvement however with ongoing infection unable to continue. -Daily CBC, CMET, INR-added INR for today. -continue CIWA protocol -Empiric treatment with folic acid, multivitamin and thiamine.  --No role for liver  transplantation given ongoing alcohol use  Blood/coffee-ground emesis in NG-tube after pulling out core track 12/2019 EGD with small varices Hgb 10.1 this morning--drop down to 7 this morning, total of 1800 cc bloody NG tube liquid, this morning appears to be more bilious.  Repeat hemoglobin 7.6. Has been on octreotide and PPI since 10/16 Possible trauma from trying to reinsert core track, however with patient's history of esophageal varices would suggest getting EGD at some point. With patient's altered mental status ideally would prefer doing endoscopic procedure on propofol versus bedside with Versed fentanyl. Discussed with Dr. Bryan Lemma, with hemoglobin increasing slightly and more bilious output, will repeat hemoglobin around 12, monitor closely. Tentatively we will schedule patient for bedside EGD 3 PM with final decision made at 12 with hemoglobin. If the hemoglobin 12 continues to decrease or patient's situation changes at the worst, will do bedside EGD 3 intubated with help from CCM. If the patient remains stable would prefer to do the EGD with propofol/moderate sedation. -Continue volume resuscitation with Hgb goal >7 (higher Hgb rates have been associated with worsened mortality in the setting of portal hypertensive variceal bleeds); unless active bleeding is occurring On unasyn for possible aspiration pneumonia  Will schedule EGD to evaluate for possible variceal bleed, trauma, esophagitis, gastritis, peptic ulcer disease, etc. With timing pending on patient's status.  I discussed risks of EGD with patient's husband today, including risk of sedation, bleeding or perforation.  Patient is unable to sign consent form hepatic encephalopathy and MS, contacted family member to get verbal consent with a witness present.  Consent in chart.  Doree Barthel [phone], patient's husband.    Hepatic Encephalopathy:  Patient only had 50 mL output from rectal pouch Has had some improvement in  mentation since yesterday - Lactulose 95m per tube q 4 hours - Rifaximin 5563mbid per tube -We will add on lactulose enemas every 8 hours, CCM added on Dulcolax suppository. - continue zinc per tube - minimize/remove all benzos and narcotics this is difficult in setting of alcohol withdrawal and agitation  MSSA bacteremia with possible aspiration pneumonia Possible contaminate but ID recommended 2-week course total, cefazolin stopped 10/16  piperacillin/tazobactam  recently added due to concern for aspiration pneumonia, ID switched to unasyn .  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 I performed a substantive portion of this encounter, including complete performance of at least one of the key components, in conjunction with the APP. I agree with the APP's note, impression, and recommendations with my edits.   Repeat hemoglobin 7.1, which is overall stable, but down from 8.6 yesterday and 10.1 the day prior.  INR 1.6 (stable) with normal BUN/creatinine.  Mag repleted earlier today.  Had a bowel movement earlier this afternoon which was not melenic or bloody.  Given downtrending H/H, history of esophageal varices, recent questionable bleed, plan for EGD this afternoon at bedside for diagnostic  and therapeutic intent.  - Continue serial CBC checks with blood products as needed per protocol - Continue lactulose (started enemas) and rifaximin - Continue octreotide and PPI - ABX per primary CCS and consulting ID service - Will update husband after EGD today  Audiel Scheiber, DO, FACG [phone]) [phone] office

## 2022-06-26 NOTE — Op Note (Signed)
[facility] Patient Name: Joann [name] Procedure Date: 06/26/2022 MRN: [id number] Attending MD: Gerrit Heck , MD Date of Birth: 06/23/1981 CSN: [id number] Age: 41 Admit Type: Inpatient Procedure:                Upper GI endoscopy Indications:              Coffee-ground emesis, Cirrhosis with history of                            esophageal varices, EtOH cirrhosis with withdrawal Providers:                Gerrit Heck, MD, Dulcy Fanny, Faustina                            [place], Technician Referring MD:              Medicines:                Fentanyl 75 micrograms IV, Midazolam 3 mg IV,                            Precedex gtt per Critical Care Complications:            No immediate complications. Estimated Blood Loss:     Estimated blood loss: none. Procedure:                Pre-Anesthesia Assessment:                           - Prior to the procedure, a History and Physical                            was performed, and patient medications and                            allergies were reviewed. The patient's tolerance of                            previous anesthesia was also reviewed. The risks                            and benefits of the procedure and the sedation                            options and risks were discussed with the patient.                            All questions were answered, and informed consent                            was obtained. Prior Anticoagulants: The patient has                            taken no previous anticoagulant or antiplatelet  agents. ASA Grade Assessment: IV - A patient with                            severe systemic disease that is a constant threat                            to life. After reviewing the risks and benefits,                            the patient was deemed in satisfactory condition to                            undergo the procedure.                           After  obtaining informed consent, the endoscope was                            passed under direct vision. Throughout the                            procedure, the patient's blood pressure, pulse, and                            oxygen saturations were monitored continuously. The                            [id number]-[id number] ([id number]) Olympus endoscope was introduced                            through the mouth, and advanced to the second part                            of duodenum. The upper GI endoscopy was                            accomplished without difficulty. The patient                            tolerated the procedure well. Scope In: Scope Out: Findings:      The upper third of the esophagus and middle third of the esophagus were       normal.      Single column of grade I varices were found in the lower third of the       esophagus.      One cratered esophageal ulcer with no stigmata of recent bleeding was       found in the lower third of the esophagus. The lesion was 4 mm in       largest dimension. Based on appearance and location, suspect pressure       ulcer from recent [place].      Moderate portal hypertensive gastropathy was found in the entire       examined stomach. NGT was noted in the stomach. The NGT was removed with  the endoscope withdrawal, with plan to replace by the Critical Care       service.      The examined duodenum was normal. Impression:               - Normal upper third of esophagus and middle third                            of esophagus.                           - Grade I esophageal varices.                           - Esophageal ulcer with no stigmata of recent                            bleeding.                           - Portal hypertensive gastropathy.                           - Normal examined duodenum.                           - No specimens collected. Moderate Sedation:      Moderate (conscious) sedation was administered by the endoscopy  nurse       and supervised by the endoscopist. The following parameters were       monitored: oxygen saturation, heart rate, blood pressure, and response       to care. Total physician intraservice time was 10 minutes. Recommendation:           - Continue close monitoring and aggressive                            managment in the ICU.                           - Continue Protonix (pantoprazole) 40 mg IV BID.                           - Start sucralfate suspension 1 gram PO QID when                            able to tolerate PO intake.                           - Ok to replace NGT.                           - Resume Octreotide for now.                           - Resume antibiotics as prescribed.                           - Resume lactulose and rifaximin.                           -  GI service will continue to follow. Procedure Code(s):        --- Professional ---                           [phone], Esophagogastroduodenoscopy, flexible,                            transoral; diagnostic, including collection of                            specimen(s) by brushing or washing, when performed                            (separate procedure)                           99152, 59, Moderate sedation services provided by                            the same physician or other qualified health care                            professional performing the diagnostic or                            therapeutic service that the sedation supports,                            requiring the presence of an independent trained                            observer to assist in the monitoring of the                            patient's level of consciousness and physiological                            status; initial 15 minutes of intraservice time,                            patient age 2 years or older Diagnosis Code(s):        --- Professional ---                           K74.60, Unspecified cirrhosis of liver                            I85.10, Secondary esophageal varices without                            bleeding                           K22.10, Ulcer of esophagus without bleeding  K76.6, Portal hypertension                           K31.89, Other diseases of stomach and duodenum                           K92.0, Hematemesis CPT copyright 2019 [employer]. All rights reserved. The codes documented in this report are preliminary and upon coder review may  be revised to meet current compliance requirements. Gerrit Heck, MD 06/26/2022 5:05:24 PM Number of Addenda: 0

## 2022-06-26 NOTE — Interval H&P Note (Signed)
History and Physical Interval Note:  06/26/2022 3:27 PM  Joann [name]  has presented today for surgery, with the diagnosis of Acute anemia, history of varices, CGE.  The various methods of treatment have been discussed with the patient and family. After consideration of risks, benefits and other options for treatment, the patient has consented to  Procedure(s): ESOPHAGOGASTRODUODENOSCOPY (EGD) WITH PROPOFOL (N/A) as a surgical intervention.  The patient's history has been reviewed, patient examined, no change in status, stable for surgery.  I have reviewed the patient's chart and labs.  Questions were answered to the patient's satisfaction.     Joann [name]

## 2022-06-26 NOTE — Progress Notes (Signed)
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 She is non verbal on my exam but moving around and sitting up trying to get out of bed so h/o is obtained from chart review and EDP.   Per report pt had been attempting to detox at home for the past couple days, although etoh level is 158. She was unable to disclose her date of last drink but is now having confusion, weakness and fatigue. It is unclear if she has been compliant with her medications at home as well, however with her ammonia at 150's doubtful.    She is unable to provide any further ros or history.   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 Admitted on prexedex gtt and neo gtt added.  Curerntly off.  On 2LNC . Marland Kitchen Afebrile. Normal WBC. Lactate > 6 this am with very ppor clearance t hough AKI resolved. Platelets down to 28K (baseline 23K- 55k).  LFTs are c/w Acute alcholic hepatitis. Discrimiant functin score 26.7 -> Start solumedrol 06/20/22 - concerns from nursing staff yesterday about  sharing medical information with husband because of red llock in the chart.  Clarification from ICU nursing director this morning from legal team: That in the best interest of patient okay to share medical information with her husband especially in the context of critical illness and decision making. urrently on Neo-Synephrine and Precedex and D5 LR.  Also on antibiotics and steroids.  Encephalopathy continues.  Growing staph and BC ID on Ancef added yesterday but remains afebrile. D2 steroids GI consult  Start tube feeds PICC placed 06/21/22 - INR worse at 2.0., Platelets worse to 20K but bili improved to 2.6 and AST improved to 160s.  /hypothermic. WBC down to 2.8K. -> Bair hugger + No  updated on BCID - still staph.  Off pressors. On 3L [place]. Ativan given overnight. Still on precedex gtt -> on and off agoitation. . D3 steroids. On Tube feeds 1U PROBC for hgb < 7 MELD 18 06/22/2022:  d4 steroids. Bili, INR, AST all better. Agitated overnight and Rx  haldo, 5.50am and Ativan x 4.30am. Back on TF. Some bradyardia HR 48 with precedex.  On TF. Admit blood cuolture growing MSSA. Ammoni down to 127 after lactulsoe increased. Afebrile. Ws hypertensive after PRBC -> got hydralazing -> now soft BP Per ICU nursing director: currently per legal not to call husband MELD 15 06/23/22 - day 5 steroid.  Severe agitation last night and acute nursing.  Precedex feeling increased.  Ankle restraints applied.  Remains  on 2 L nasal cannula.  Remains on Precedex.  Remains afebrile with normal white count.  Is on cefazolin lactulose and Xifaxan.  Significance for low magnesium low phosphorus and low potassium today.  Liver function [platelet and INR ) improving. GI wants goal BM to be 3/day -> increased lacutuloise GI started Zinc Sulfate to help portosystemic encephalopathy Phenobarb protocol added (did help her in June 2023)  06/24/2022 - reamins n 2L [place]. Now hypotensive on precedex -> alb/neo being started. On TF.  Afebrile. Improving LFT (bili, platelet, INR and AST). STill not having 3BM/day. MELD down to 13 points 10/16Remains agitated overnight.  This morning a bit more somnolent.  Grunting.  NG tube was removed overnight.  Since then respiratory rate up a bit.  Concern for aspiration.  She was noted to cough what appeared to be feculent material during my evaluation.  Abdomen remains very distended.  BMs have been minimal.  Remains hypotensive.  Cefazolin stopped given concern for cephalosporin induced encephalopathy.  Started Zosyn for aspiration and also to cover MSSA bacteremia.  ID subsequently changed to Unasyn.  Weaned Precedex, prioritize Haldol   Interim History / Subjective:   This morning she is more alert.  Interactive.  Answering questions.  Asking questions.  Weaning Precedex.  Received frequent Haldol overnight, perhaps this helped.  Objective   Blood pressure 111/66, pulse 66, temperature 98 F (36.7 C), temperature source Axillary, resp. rate (!) 22, height 5' 4"  (1.626 m), weight 82 kg, SpO2 99 %.        Intake/Output Summary (Last 24 hours) at 06/26/2022 0825 Last data filed at 06/26/2022 0700 Gross per 24 hour  Intake 4377.28 ml  Output 3100 ml  Net 1277.28 ml    Filed Weights   06/24/22 0426 06/25/22 0500 06/26/22 0500  Weight: 80.4 kg 81.2 kg 82 kg    General Appearance:  Looks criticall ill  Head:  Normocephalic, without obvious abnormality, atraumatic Eyes:  PERRL - yes, conjunctiva/corneas - mddy     Lungs: Rhonchorous, tachypneic Heart:  S1 and S2 normal, no murmur Abdomen: Distended, nontender Neurologic: Grunts, appears more lethargic compared to prior description of examination, does not reliably follow commands     Resolved Hospital Problem list     Assessment & Plan:   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  Would obviously surprised if related to abuse at home.  Unclear if  ingestion at home.  Possible hepatic encephalopathy.  10/17 improving with more frequent antipsychotic administration.  Likely element of delirium. -- Continue Precedex wean, ceiling for dose decreased again -- Prioritize antipsychotics, IV Haldol for now, would likely benefit for longer acting but given her ileus unable to take p.o. or per tube very well, EKG QTc 463 10/17 a.m. -- Minimize Ativan use, CIWA discontinued given 7+ days in the hospital -- Cephalosporins stopped 10/16  Cirrhosis: With concern for hepatic encephalopathy. -- Continue lactulose, rifaximin -- NG tube now to suction, can clamp intermittently for lactulose administration  Acute alcoholic hepatitis on cirrhosis: Initially on prednisolone.  LFTs improved. -- 10/16 stop prednisolone given infections  Aspiration pneumonia: With increased work of breathing, hypoxemia appears stable 10/16.  Cough what appears to be feculent versus coffee-ground material. -- Zosyn 10/16, socially changed by ID to Unasyn 10/16 -- Chest x-ray 10/16 with right upper and lower lung opacities  MSSA bacteremia: -- Plan 2-week course total, cefazolin stopped 10/16, Zosyn added for aspiration simply changed to Unasyn by  ID 10/16  Ileus: Distended, minimal stool.  Concern for feculent regurgitation 10/16 a.m. -- NG to suction -- Bisacodyl suppository  Shock: Suspect multifactorial related to hypovolemia, sedatives, concern for new sepsis with aspiration, ongoing MSSA bacteremia. -- Phenylephrine, MAP goal greater than 65, wean Precedex  Best Practice (right click and "Reselect all SmartList Selections" daily)  PICC line -since 06/20/22 Diet/type: NPO, ileus DVT prophylaxis: SCD GI prophylaxis: PPI Lines: N/A Foley:  N/A  Code Status:  full code  Last date of multidisciplinary goals of care discussion  As of 06/26/2019 3 PM okay for updates to be given to husband   CRITICAL CARE Performed by: Bonna Gains Rowland Ericsson   Total critical  care time: 35 minutes  Critical care time was exclusive of separately billable procedures and treating other patients.  Critical care was necessary to treat or prevent imminent or life-threatening deterioration.  Critical care was time spent personally by me on the following activities: development of treatment plan with patient and/or surrogate as well as nursing, discussions with consultants, evaluation of patient's response to treatment, examination of patient, obtaining history from patient or surrogate, ordering and performing treatments and interventions, ordering and review of laboratory studies, ordering and review of radiographic studies, pulse oximetry and re-evaluation of patient's condition.   Lanier Clam, MD See Amion for contact info  06/26/2022 8:25 AM

## 2022-06-26 NOTE — Progress Notes (Signed)
[facility] Progress Note Patient Name: Joann [name] DOB: July 19, 1981 MRN: [id number]   Date of Service  06/26/2022  HPI/Events of Note  Agitation - Nursing request to renew restraint orders.   eICU Interventions  Will renew restraint orders x 12 hours.      Intervention Category Major Interventions: Delirium, psychosis, severe agitation - evaluation and management  Rodnesha Elie Eugene 06/26/2022, 8:12 PM

## 2022-06-26 NOTE — H&P (View-Only) (Signed)
Progress Note  Primary GI: Dr. 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 Patient had a total of 1600 output from NG tube yesterday and 425 since 1900 this morning.  It is slowing down.  Not as dark as previous with more bilious appearance. Rectal pouch with only 50 mL output yesterday. Patient's been on Protonix and octreotide since yesterday. Still requiring 3 L nasal cannula, some improvement of work of breathing today. Per nurse and per physical exam slightly more responsive today, able to respond to simple questions, still not oriented to place or time and still very lethargic.    Objective   Vital signs in last 24 hours: Temp:  [98 F (36.7 C)-101 F (38.3 C)] 98 F (36.7 C) (10/17 0400) Pulse Rate:  [53-111] 66 (10/17 0700) Resp:  [18-32] 22 (10/17 0700) BP: (71-163)/(36-90) 111/66 (10/17 0700) SpO2:  [82 %-100 %] 99 % (10/17 0700) Weight:  [82 kg] 82 kg (10/17 0500) Last BM Date : 06/25/22 (little out of rectal tube) Last BM recorded by nurses in past 5 days Stool Type: Type 7 (Liquid consistency with no solid pieces) (06/26/2022  4:18 AM)  General: Chronically ill-appearing African-American female, NG tube in place with 600 cc bloody/bilious material Heart:  Regular rate and rhythm; no murmurs Pulm: Coarse breath sounds, slightly improved from yesterday, 3 L nasal cannula Abdomen:  Soft,  distended  AB,  sluggish  bowel sounds compared to yesterday. No tenderness obvious but patient with AMS No organomegaly appreciated.  No shifting dullness or fluid wave appreciated. Extremities:  without  edema. Neurologic: Patient with slightly improving altered mental status, able to respond to questions today, still rather sedated/lethargic, positive clonus appreciated.  Intake/Output from previous day: 10/16 0701 - 10/17 0700 In: 4490.9 [I.V.:3550.7;  NG/GT:385; IV Piggyback:545.2] Out: 3100 [Urine:1025; Emesis/NG output:2025; Stool:50] Intake/Output this shift: No intake/output data recorded.  Studies/Results: DG Chest 1 View  Result Date: 06/25/2022 CLINICAL DATA:  Cough EXAM: CHEST  1 VIEW COMPARISON:  06/19/2022 FINDINGS: Right-sided PICC line with the tip projecting over the cavoatrial junction. Nasogastric tube with the tip projecting over the stomach. Gaseous distension of the stomach. Hazy right upper and lower lung airspace disease concerning for pneumonia. No pleural effusion or pneumothorax. Heart and mediastinal contours are unremarkable. No acute osseous abnormality. IMPRESSION: 1. Hazy right upper and lower lung airspace disease concerning for pneumonia. 2. Nasogastric tube with the tip projecting over the stomach. 3. Right-sided PICC line with the tip projecting over the cavoatrial junction. Electronically Signed   By: Kathreen Devoid M.D.   On: 06/25/2022 11:08   DG Abd 1 View  Result Date: 06/25/2022 CLINICAL DATA:  NG tube placement EXAM: ABDOMEN - 1 VIEW COMPARISON:  06/25/2022 FINDINGS: Nasogastric tube in the stomach. Gaseous distension of the: Unchanged compared with 06/25/2022. No small bowel dilatation. No evidence of pneumoperitoneum, portal venous gas or pneumatosis. No pathologic calcifications along the expected course of the ureters. No acute osseous abnormality. IMPRESSION: 1. Nasogastric tube in the stomach. Electronically Signed   By: Kathreen Devoid M.D.   On: 06/25/2022 09:10   DG Abd 1 View  Result Date: 06/25/2022 CLINICAL DATA:  41 year old female status post nasogastric tube placement. EXAM: ABDOMEN - 1 VIEW COMPARISON:  06/24/2022. FINDINGS: Previously noted small bore feeding tube has been partially withdrawn, now with tip in the distal esophagus just before the gastroesophageal junction. Surgical clips project over  the right upper quadrant of the abdomen, likely from prior cholecystectomy. Gaseous distention  is noted in portions of the transverse colon. Lower abdomen is incompletely imaged. IMPRESSION: 1. Tip of small bore feeding tube is in the distal esophagus shortly before the gastroesophageal junction. Electronically Signed   By: Vinnie Langton M.D.   On: 06/25/2022 05:30   DG Abd Portable 1V  Result Date: 06/24/2022 CLINICAL DATA:  Ileus EXAM: PORTABLE ABDOMEN - 1 VIEW COMPARISON:  06/19/2022 FINDINGS: Soft feeding tube enters the stomach with its tip at the body antrum junction. Small bowel gas pattern shows a few gas-filled loops. There is a moderate amount of gas in the colon. Findings could be due to a mild generalized ileus. No advanced finding. IMPRESSION: Soft feeding tube tip at the body antrum junction. Mild generalized ileus pattern. No advanced finding. Electronically Signed   By: Nelson Chimes M.D.   On: 06/24/2022 10:50    Lab Results: Recent Labs    06/25/22 0544 06/25/22 1355 06/25/22 1653 06/26/22 0225  WBC 10.7* 12.6*  --  6.1  HGB 10.1* 8.9* 8.6* 7.0*  HCT 37.1 31.5* 30.4* 24.9*  PLT 87* 119*  --  73*   BMET Recent Labs    06/24/22 0545 06/25/22 0544 06/26/22 0225  NA 139 136 141  K 3.7 3.8 3.5  CL 112* 113* 118*  CO2 23 17* 19*  GLUCOSE 181* 119* 117*  BUN 8 15 10   CREATININE <0.30* 0.77 0.50  CALCIUM 8.7* 9.3 7.3*   LFT Recent Labs    06/25/22 0544 06/26/22 0225  PROT 7.5 5.6*  ALBUMIN 3.1* 2.3*  AST 52* 47*  ALT 16 13  ALKPHOS 112 77  BILITOT 1.9* 2.0*  BILIDIR 0.9*  --   IBILI 1.0*  --    PT/INR Recent Labs    06/24/22 0545 06/25/22 0450  LABPROT 18.9* 18.4*  INR 1.6* 1.5*     Scheduled Meds:  Chlorhexidine Gluconate Cloth  6 each Topical Q2200   feeding supplement (PROSource TF20)  60 mL Per Tube Daily   folic acid  1 mg Per Tube Daily   lactulose  30 g Per Tube Q4H   mouth rinse  15 mL Mouth Rinse 4 times per day   [START ON 06/28/2022] pantoprazole  40 mg Intravenous Q12H   rifaximin  550 mg Per Tube BID   sodium chloride  flush  10-40 mL Intracatheter Q12H   thiamine  100 mg Per Tube Daily   Or   thiamine  100 mg Intravenous Daily   zinc sulfate  220 mg Per Tube Daily   Continuous Infusions:  sodium chloride Stopped (06/26/22 0658)   albumin human     ampicillin-sulbactam (UNASYN) IV Stopped (06/26/22 0447)   dexmedetomidine (PRECEDEX) IV infusion 0.8 mcg/kg/hr (06/26/22 0755)   feeding supplement (OSMOLITE 1.5 CAL) Stopped (06/24/22 1200)   lactated ringers 50 mL/hr at 06/26/22 0700   octreotide (SANDOSTATIN) 500 mcg in sodium chloride 0.9 % 250 mL (2 mcg/mL) infusion 50 mcg/hr (06/26/22 0700)   pantoprazole 8 mg/hr (06/26/22 0700)   phenylephrine (NEO-SYNEPHRINE) Adult infusion 50 mcg/min (06/26/22 0700)   potassium chloride 10 mEq (06/26/22 0756)      Patient profile:   41 year old female with history of alcohol induced cirrhosis, portal hypertension with PCOS and prior EGD, prior episodes of alcoholic pancreatitis, continued alcohol use disorder complicated by withdrawals presented alcohol withdrawals, alcoholic hepatitis, hepatic encephalopathy.   Impression/Plan:   Decompensated cirrhosis secondary to ETOH MELD 3.0:  16 at 06/26/2022  2:25 AM MELD-Na: 14 at 06/26/2022  2:25 AM Calculated from: Serum Creatinine: 0.50 mg/dL (Using min of 1 mg/dL) at 06/26/2022  2:25 AM Serum Sodium: 141 mmol/L (Using max of 137 mmol/L) at 06/26/2022  2:25 AM Total Bilirubin: 2.0 mg/dL at 06/26/2022  2:25 AM Serum Albumin: 2.3 g/dL at 06/26/2022  2:25 AM INR(ratio): 1.5 at 06/25/2022  4:50 AM Age at listing (hypothetical): 41 years Sex: Female at 06/26/2022  2:25 AM  MELD-Na: 14 ( was 18 on admission) Steroids stopped day 6 due to possible aspiration pneumonia, it did show possible improvement however with ongoing infection unable to continue. -Daily CBC, CMET, INR-added INR for today. -continue CIWA protocol -Empiric treatment with folic acid, multivitamin and thiamine.  --No role for liver  transplantation given ongoing alcohol use  Blood/coffee-ground emesis in NG-tube after pulling out core track 12/2019 EGD with small varices Hgb 10.1 this morning--drop down to 7 this morning, total of 1800 cc bloody NG tube liquid, this morning appears to be more bilious.  Repeat hemoglobin 7.6. Has been on octreotide and PPI since 10/16 Possible trauma from trying to reinsert core track, however with patient's history of esophageal varices would suggest getting EGD at some point. With patient's altered mental status ideally would prefer doing endoscopic procedure on propofol versus bedside with Versed fentanyl. Discussed with Dr. Bryan Lemma, with hemoglobin increasing slightly and more bilious output, will repeat hemoglobin around 12, monitor closely. Tentatively we will schedule patient for bedside EGD 3 PM with final decision made at 12 with hemoglobin. If the hemoglobin 12 continues to decrease or patient's situation changes at the worst, will do bedside EGD 3 intubated with help from CCM. If the patient remains stable would prefer to do the EGD with propofol/moderate sedation. -Continue volume resuscitation with Hgb goal >7 (higher Hgb rates have been associated with worsened mortality in the setting of portal hypertensive variceal bleeds); unless active bleeding is occurring On unasyn for possible aspiration pneumonia  Will schedule EGD to evaluate for possible variceal bleed, trauma, esophagitis, gastritis, peptic ulcer disease, etc. With timing pending on patient's status.  I discussed risks of EGD with patient's husband today, including risk of sedation, bleeding or perforation.  Patient is unable to sign consent form hepatic encephalopathy and MS, contacted family member to get verbal consent with a witness present.  Consent in chart.  Doree Barthel [phone], patient's husband.    Hepatic Encephalopathy:  Patient only had 50 mL output from rectal pouch Has had some improvement in  mentation since yesterday - Lactulose 40m per tube q 4 hours - Rifaximin 5550mbid per tube -We will add on lactulose enemas every 8 hours, CCM added on Dulcolax suppository. - continue zinc per tube - minimize/remove all benzos and narcotics this is difficult in setting of alcohol withdrawal and agitation  MSSA bacteremia with possible aspiration pneumonia Possible contaminate but ID recommended 2-week course total, cefazolin stopped 10/16  piperacillin/tazobactam  recently added due to concern for aspiration pneumonia, ID switched to unasyn .  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 I performed a substantive portion of this encounter, including complete performance of at least one of the key components, in conjunction with the APP. I agree with the APP's note, impression, and recommendations with my edits.   Repeat hemoglobin 7.1, which is overall stable, but down from 8.6 yesterday and 10.1 the day prior.  INR 1.6 (stable) with normal BUN/creatinine.  Mag repleted earlier today.  Had a bowel movement earlier this afternoon which was not melenic or bloody.  Given downtrending H/H, history of esophageal varices, recent questionable bleed, plan for EGD this afternoon at bedside for diagnostic  and therapeutic intent.  - Continue serial CBC checks with blood products as needed per protocol - Continue lactulose (started enemas) and rifaximin - Continue octreotide and PPI - ABX per primary CCS and consulting ID service - Will update husband after EGD today  Yazleemar Strassner, DO, FACG [phone]) [phone] office

## 2022-06-26 NOTE — Progress Notes (Signed)
[facility] ADULT ICU REPLACEMENT PROTOCOL   The patient does apply for the [facility] Adult ICU Electrolyte Replacment Protocol based on the criteria listed below:   1.Exclusion criteria: TCTS patients, ECMO patients, and Dialysis patients 2. Is GFR >/= 30 ml/min? Yes.    Patient's GFR today is >60 3. Is SCr </= 2? Yes.   Patient's SCr is 0.50 mg/dL 4. Did SCr increase >/= 0.5 in 24 hours? No. 5.Pt's weight >40kg  Yes.   6. Abnormal electrolyte(s): K+ 3.5, Mag 1.2  7. Electrolytes replaced per protocol 8.  Call MD STAT for K+ </= 2.5, Phos </= 1, or Mag </= 1 Physician:  Randie Heinz 06/26/2022 4:53 AM

## 2022-06-26 NOTE — Plan of Care (Signed)
  Problem: Education: Goal: Knowledge of General Education information will improve Description: Including pain rating scale, medication(s)/side effects and non-pharmacologic comfort measures Outcome: Not Progressing   Problem: Health Behavior/Discharge Planning: Goal: Ability to manage health-related needs will improve Outcome: Not Progressing   Problem: Clinical Measurements: Goal: Ability to maintain clinical measurements within normal limits will improve Outcome: Not Progressing   Problem: Coping: Goal: Level of anxiety will decrease Outcome: Not Progressing   Problem: Safety: Goal: Non-violent Restraint(s) Outcome: Not Progressing

## 2022-06-26 NOTE — Progress Notes (Signed)
eLink Physician-Brief Progress Note Patient Name: Joann [name] DOB: 12-23-1980 MRN: [id number]   Date of Service  06/26/2022  HPI/Events of Note  Anemia - Hgb = 6.6.  eICU Interventions  Will transfuse 1 unit PRBC.     Intervention Category Major Interventions: Other:  [name] Cornelia Copa 06/26/2022, 8:30 PM

## 2022-06-27 DIAGNOSIS — G9340 Encephalopathy, unspecified: Secondary | ICD-10-CM | POA: Diagnosis not present

## 2022-06-27 LAB — HEPATIC FUNCTION PANEL
ALT: 12 U/L (ref 0–44)
AST: 45 U/L — ABNORMAL HIGH (ref 15–41)
Albumin: 3.1 g/dL — ABNORMAL LOW (ref 3.5–5.0)
Alkaline Phosphatase: 74 U/L (ref 38–126)
Bilirubin, Direct: 0.9 mg/dL — ABNORMAL HIGH (ref 0.0–0.2)
Indirect Bilirubin: 1 mg/dL — ABNORMAL HIGH (ref 0.3–0.9)
Total Bilirubin: 1.9 mg/dL — ABNORMAL HIGH (ref 0.3–1.2)
Total Protein: 6.4 g/dL — ABNORMAL LOW (ref 6.5–8.1)

## 2022-06-27 LAB — BPAM RBC
Blood Product Expiration Date: 202311182359
ISSUE DATE / TIME: 202310172336
Unit Type and Rh: 5100

## 2022-06-27 LAB — GLUCOSE, CAPILLARY
Glucose-Capillary: 111 mg/dL — ABNORMAL HIGH (ref 70–99)
Glucose-Capillary: 116 mg/dL — ABNORMAL HIGH (ref 70–99)
Glucose-Capillary: 105 mg/dL — ABNORMAL HIGH (ref 70–99)
Glucose-Capillary: 99 mg/dL (ref 70–99)
Glucose-Capillary: 110 mg/dL — ABNORMAL HIGH (ref 70–99)

## 2022-06-27 LAB — TYPE AND SCREEN
ABO/RH(D): O POS
Antibody Screen: POSITIVE
Unit division: 0

## 2022-06-27 LAB — CBC
HCT: 27 % — ABNORMAL LOW (ref 36.0–46.0)
Hemoglobin: 7.8 g/dL — ABNORMAL LOW (ref 12.0–15.0)
MCH: 25.5 pg — ABNORMAL LOW (ref 26.0–34.0)
MCHC: 28.9 g/dL — ABNORMAL LOW (ref 30.0–36.0)
MCV: 88.2 fL (ref 80.0–100.0)
Platelets: 61 10*3/uL — ABNORMAL LOW (ref 150–400)
RBC: 3.06 MIL/uL — ABNORMAL LOW (ref 3.87–5.11)
RDW: 26.8 % — ABNORMAL HIGH (ref 11.5–15.5)
WBC: 4.4 10*3/uL (ref 4.0–10.5)
nRBC: 0.5 % — ABNORMAL HIGH (ref 0.0–0.2)

## 2022-06-27 LAB — BASIC METABOLIC PANEL
Anion gap: 5 (ref 5–15)
BUN: 6 mg/dL (ref 6–20)
CO2: 22 mmol/L (ref 22–32)
Calcium: 7.9 mg/dL — ABNORMAL LOW (ref 8.9–10.3)
Chloride: 118 mmol/L — ABNORMAL HIGH (ref 98–111)
Creatinine, Ser: 0.35 mg/dL — ABNORMAL LOW (ref 0.44–1.00)
GFR, Estimated: >60 mL/min (ref >60)
Glucose, Bld: 116 mg/dL — ABNORMAL HIGH (ref 70–99)
Potassium: 3.9 mmol/L (ref 3.5–5.1)
Sodium: 145 mmol/L (ref 135–145)

## 2022-06-27 LAB — MAGNESIUM: Magnesium: 2.3 mg/dL (ref 1.7–2.4)

## 2022-06-27 LAB — PHOSPHORUS: Phosphorus: 2.5 mg/dL (ref 2.5–4.6)

## 2022-06-27 LAB — PROTIME-INR
INR: 1.7 — ABNORMAL HIGH (ref 0.8–1.2)
Prothrombin Time: 20.1 seconds — ABNORMAL HIGH (ref 11.4–15.2)

## 2022-06-27 MED ORDER — OLANZAPINE 10 MG PO TABS
10.0000 mg | ORAL_TABLET | Freq: Two times a day (BID) | ORAL | Status: DC
  Administered 2022-06-27 – 2022-06-29 (×6): 10 mg
  Filled 2022-06-27 (×7): qty 1

## 2022-06-27 MED ORDER — HALOPERIDOL LACTATE 5 MG/ML IJ SOLN
2.0000 mg | Freq: Four times a day (QID) | INTRAMUSCULAR | Status: DC | PRN
  Administered 2022-06-27 – 2022-06-28 (×3): 2 mg via INTRAVENOUS
  Filled 2022-06-27 (×3): qty 1

## 2022-06-27 MED ORDER — LACTULOSE 10 GM/15ML PO SOLN
30.0000 g | Freq: Three times a day (TID) | ORAL | Status: DC
  Administered 2022-06-27 – 2022-06-28 (×4): 30 g
  Filled 2022-06-27 (×4): qty 45

## 2022-06-27 MED ORDER — CEFAZOLIN SODIUM-DEXTROSE 2-4 GM/100ML-% IV SOLN
2.0000 g | Freq: Three times a day (TID) | INTRAVENOUS | Status: DC
  Administered 2022-06-28 – 2022-07-03 (×17): 2 g via INTRAVENOUS
  Filled 2022-06-27 (×18): qty 100

## 2022-06-27 NOTE — Progress Notes (Addendum)
Attending physician's note   I have taken a history, reviewed the chart, and examined the patient. I performed a substantive portion of this encounter, including complete performance of at least one of the key components, in conjunction with the APP. I agree with the APP's note, impression, and recommendations with my edits.   Some improvement in mental status and able to indicate she is at [place] [facility].  EGD completed yesterday and notable for single column of grade 1 esophageal varix, moderate PHG, and ulcer in the distal esophagus.  Based on appearance and location, likely 2/2 core track pressure ulcer.  Transfused 1 unit PRBCs overnight for hemoglobin 6.6, with good serologic response (7.7 today).  Otherwise no overt bleeding.  Certainly at elevated risk for gastric oozing from moderate PHG.  Plan to continue high-dose PPI then add Carafate when able to tolerate p.o.  Attempting NG clamp trial today per [facility].  Patricia Fargo, DO, FACG [phone] office          Progress Note  Primary GI: Dr. 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 Patient's had greater output from rectal pouch, abdomen softer. Has moments of clarity per nurse but while I am here patient not responsive to verbal stimuli and will not follow direction.    Objective   Vital signs in last 24 hours: Temp:  [97.2 F (36.2 C)-98.4 F (36.9 C)] 97.7 F (36.5 C) (10/18 0800) Pulse Rate:  [51-107] 52 (10/18 0700) Resp:  [9-32] 16 (10/18 0700) BP: (72-167)/(37-123) 108/59 (10/18 0700) SpO2:  [85 %-100 %] 100 % (10/18 0700) Weight:  [80.6 kg] 80.6 kg (10/18 0500) Last BM Date : 06/26/22 Last BM recorded by nurses in past 5 days Stool Type: Type 7 (Liquid consistency with no solid pieces) (06/26/2022  8:00  PM)  General: Chronically ill-appearing African-American female, NG tube in place with 500 cc bilious material Heart:  Regular rate and rhythm; no murmurs Pulm: Breath sounds clear anteriorly, 2 L nasal cannula Abdomen:  Soft, Non-distended AB, Active bowel sounds  No tenderness. No organomegaly appreciated.  No shifting dullness or fluid wave appreciated. Extremities:  without  edema, no jaundice, no icterus.  Neurologic: Patient with slightly improving altered mental status per nurse was talking this AM, Would not answer questions for me, pin point equal pupils, still rather sedated/lethargic, negative clonus  Intake/Output from previous day: 10/17 0701 - 10/18 0700 In: 5111.2 [I.V.:3467.3; Blood:407; NG/GT:350; IV Piggyback:886.8] Out: 2458 [Urine:1550; Emesis/NG output:950; Stool:575] Intake/Output this shift: No intake/output data recorded.  Studies/Results: DG Abd 1 View  Result Date: 06/26/2022 CLINICAL DATA:  NG-tube EXAM: ABDOMEN - 1 VIEW COMPARISON:  Abdominal x-ray 06/25/2022 FINDINGS: Nasogastric tube tip is in the mid stomach. Gaseous distention of colon in the upper abdomen persists. Cholecystectomy clips are present. IMPRESSION: Nasogastric tube tip is in the mid stomach. Electronically Signed   By: Ronney Asters M.D.   On: 06/26/2022 18:37   DG Chest 1 View  Result Date: 06/25/2022 CLINICAL DATA:  Cough EXAM: CHEST  1 VIEW COMPARISON:  06/19/2022 FINDINGS: Right-sided PICC line with the tip projecting over the cavoatrial junction. Nasogastric tube with the tip projecting over the stomach. Gaseous distension of the stomach. Hazy right upper and lower lung airspace disease concerning for pneumonia. No pleural effusion or pneumothorax. Heart and mediastinal  contours are unremarkable. No acute osseous abnormality. IMPRESSION: 1. Hazy right upper and lower lung airspace disease concerning for pneumonia. 2. Nasogastric tube with the tip projecting over the stomach. 3. Right-sided  PICC line with the tip projecting over the cavoatrial junction. Electronically Signed   By: Kathreen Devoid M.D.   On: 06/25/2022 11:08   DG Abd 1 View  Result Date: 06/25/2022 CLINICAL DATA:  NG tube placement EXAM: ABDOMEN - 1 VIEW COMPARISON:  06/25/2022 FINDINGS: Nasogastric tube in the stomach. Gaseous distension of the: Unchanged compared with 06/25/2022. No small bowel dilatation. No evidence of pneumoperitoneum, portal venous gas or pneumatosis. No pathologic calcifications along the expected course of the ureters. No acute osseous abnormality. IMPRESSION: 1. Nasogastric tube in the stomach. Electronically Signed   By: Kathreen Devoid M.D.   On: 06/25/2022 09:10    Lab Results: Recent Labs    06/25/22 1355 06/25/22 1653 06/26/22 0225 06/26/22 0853 06/26/22 1759 06/26/22 1958 06/27/22 0357  WBC 12.6*  --  6.1  --   --   --  4.4  HGB 8.9*   < > 7.0*   < > 7.3* 6.6* 7.8*  HCT 31.5*   < > 24.9*   < > 26.3* 24.1* 27.0*  PLT 119*  --  73*  --   --   --  61*   < > = values in this interval not displayed.   BMET Recent Labs    06/26/22 0225 06/26/22 1958 06/27/22 0357  NA 141 144 145  K 3.5 3.5 3.9  CL 118* 119* 118*  CO2 19* 20* 22  GLUCOSE 117* 119* 116*  BUN 10 7 6   CREATININE 0.50 0.32* 0.35*  CALCIUM 7.3* 7.7* 7.9*   LFT Recent Labs    06/25/22 0544 06/26/22 0225  PROT 7.5 5.6*  ALBUMIN 3.1* 2.3*  AST 52* 47*  ALT 16 13  ALKPHOS 112 77  BILITOT 1.9* 2.0*  BILIDIR 0.9*  --   IBILI 1.0*  --    PT/INR Recent Labs    06/26/22 1224 06/27/22 0357  LABPROT 18.8* 20.1*  INR 1.6* 1.7*     Scheduled Meds:  sodium chloride   Intravenous Once   Chlorhexidine Gluconate Cloth  6 each Topical Q2200   feeding supplement (PROSource TF20)  60 mL Per Tube Daily   folic acid  1 mg Per Tube Daily   lactulose  30 g Per Tube TID   OLANZapine  10 mg Per Tube BID   mouth rinse  15 mL Mouth Rinse 4 times per day   [START ON 06/28/2022] pantoprazole  40 mg Intravenous Q12H    rifaximin  550 mg Per Tube BID   sodium chloride flush  10-40 mL Intracatheter Q12H   thiamine  100 mg Per Tube Daily   Or   thiamine  100 mg Intravenous Daily   zinc sulfate  220 mg Per Tube Daily   Continuous Infusions:  sodium chloride 20 mL/hr at 06/27/22 0600   ampicillin-sulbactam (UNASYN) IV Stopped (06/27/22 0433)   dexmedetomidine (PRECEDEX) IV infusion 1.2 mcg/kg/hr (06/27/22 0600)   feeding supplement (OSMOLITE 1.5 CAL) Stopped (06/24/22 1200)   lactated ringers 50 mL/hr at 06/27/22 0600   octreotide (SANDOSTATIN) 500 mcg in sodium chloride 0.9 % 250 mL (2 mcg/mL) infusion 50 mcg/hr (06/27/22 0600)   pantoprazole 8 mg/hr (06/27/22 0600)   phenylephrine (NEO-SYNEPHRINE) Adult infusion 10 mcg/min (06/27/22 0600)      Patient profile:   41 year old female with history  of alcohol induced cirrhosis, portal hypertension with PCOS and prior EGD, prior episodes of alcoholic pancreatitis, continued alcohol use disorder complicated by withdrawals presented alcohol withdrawals, alcoholic hepatitis, hepatic encephalopathy.   Impression/Plan:   Decompensated cirrhosis secondary to ETOH MELD-Na: 15 ( was 18 on admission) Steroids stopped day 6 due to possible aspiration pneumonia, it did show possible improvement however with ongoing infection unable to continue. -Daily CBC, CMET, INR-added INR for today. -continue CIWA protocol -Empiric treatment with folic acid, multivitamin and thiamine.  --No role for liver transplantation given ongoing alcohol use - pending repeat liver function today  Blood/coffee-ground emesis in NG-tube after pulling out core track with drop in hemoglobin from 10-7 12/2019 EGD with small varices 06/26/2022 EGD bedside showed grade 1 esophageal varices no stigmata of bleeding, cratered esophageal ulcer likely from core track and moderate portal hypertensive gastropathy Finish protonix IV 72 hours and will switch to Protonix 40 mg IV twice daily  tomorrow Will finish octreotide dose tomorrow We will add on Carafate 1 g p.o. 4 times daily when able to tolerate p.o. intake -Continue volume resuscitation with Hgb goal >7 (higher Hgb rates have been associated with worsened mortality in the setting of portal hypertensive variceal bleeds); unless active bleeding is occurring -Per nurse, CCM plans trial of tube clamping today and see how patient responds in hopes of starting feedings, will see how this proceeds.    Hepatic Encephalopathy:  575 ml from rectal pouch yesterday compared to only 50 ml the day prior Has had some improvement in mentation since yesterday per nurse despite EGD yesterday - Lactulose 60m switched from every 4 hours to 3 x a day - Rifaximin 5521mbid per tube - continue zinc per tube - minimize/remove all benzos and narcotics this is difficult in setting of alcohol withdrawal and agitation  MSSA bacteremia with possible aspiration pneumonia Possible contaminate but ID recommended 2-week course total, cefazolin stopped 10/16  On unasyn .  Afebrile, continues on 3 L nasal cannula CXR hazy opacity right upper and lower lung Prednisone stopped 10/16  with Coagulopathy Increasing INR to 1.7, last albumin was 2.3 Can consider Vitamin K to see if from deficiency  Lab Results  Component Value Date   INR 1.7 (H) 06/27/2022   INR 1.6 (H) 06/26/2022   INR 1.5 (H) 06/25/2022  Repeat daily  Thrombocytopenia Secondary to cirrhosis    LOS: 9 days   AmVladimir Crofts10/18/2023, 8:46 AM Office:[phone]
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
## 2022-06-27 NOTE — Progress Notes (Signed)
[facility] for Infectious Disease   Reason for visit: follow up on positive blood cultures  Interval History: no acute events; WBC stable, remains afebrile > 48 hours.  Day 8 total antibiotics  Physical Exam: Constitutional:  Vitals:   06/27/22 0700 06/27/22 0800  BP: (!) 108/59   Pulse: (!) 52   Resp: 16   Temp:  97.7 F (36.5 C)  SpO2: 100%   She is in nad   Review of Systems: Unable to be assessed due to mental status  Lab Results  Component Value Date   WBC 4.4 06/27/2022   HGB 7.8 (L) 06/27/2022   HCT 27.0 (L) 06/27/2022   MCV 88.2 06/27/2022   PLT 61 (L) 06/27/2022    Lab Results  Component Value Date   CREATININE 0.35 (L) 06/27/2022   BUN 6 06/27/2022   NA 145 06/27/2022   K 3.9 06/27/2022   CL 118 (H) 06/27/2022   CO2 22 06/27/2022    Lab Results  Component Value Date   ALT 13 06/26/2022   AST 47 (H) 06/26/2022   ALKPHOS 77 06/26/2022     Microbiology: Recent Results (from the past 240 hour(s))  Blood culture (routine x 2)     Status: None   Collection Time: 06/18/22  8:40 PM   Specimen: BLOOD  Result Value Ref Range Status   Specimen Description   Final    BLOOD BLOOD LEFT ARM Performed at [facility], [place] [address] [place], [place] [address]    Special Requests   Final    BOTTLES DRAWN AEROBIC AND ANAEROBIC Blood Culture results may not be optimal due to an inadequate volume of blood received in culture bottles Performed at [facility] [address], [place], [place] [address]    Culture   Final    NO GROWTH 5 DAYS Performed at [facility], [place] [address], [place], [place] [address]    Report Status 06/24/2022 FINAL  Final  Blood culture (routine x 2)     Status: Abnormal   Collection Time: 06/18/22 10:34 PM   Specimen: BLOOD  Result Value Ref Range Status   Specimen Description   Final    BLOOD RIGHT ANTECUBITAL Performed at [facility], [place] [address]  [place], [place] [address]    Special Requests   Final    Blood Culture adequate volume BOTTLES DRAWN AEROBIC AND ANAEROBIC Performed at [facility] [address], [place], [place] [address]    Culture  Setup Time   Final    GRAM POSITIVE COCCI IN PAIRS BOTTLES DRAWN AEROBIC ONLY CRITICAL RESULT CALLED TO, READ BACK BY AND VERIFIED WITH: PHARMD NICK GLOGOVAC ON 06/19/22 @ 1734 BY DRT   Performed at [facility], [place] [address], [place], [place] [address]    Culture STAPHYLOCOCCUS AUREUS (A)  Final   Report Status 06/21/2022 FINAL  Final   Organism ID, Bacteria STAPHYLOCOCCUS AUREUS  Final      Susceptibility   Staphylococcus aureus - MIC*    CIPROFLOXACIN <=0.5 SENSITIVE Sensitive     ERYTHROMYCIN <=0.25 SENSITIVE Sensitive     GENTAMICIN <=0.5 SENSITIVE Sensitive     OXACILLIN <=0.25 SENSITIVE Sensitive     TETRACYCLINE <=1 SENSITIVE Sensitive     VANCOMYCIN 1 SENSITIVE Sensitive     TRIMETH/SULFA <=10 SENSITIVE Sensitive     CLINDAMYCIN <=0.25 SENSITIVE Sensitive     RIFAMPIN <=0.5 SENSITIVE Sensitive  Inducible Clindamycin NEGATIVE Sensitive     * STAPHYLOCOCCUS AUREUS  Blood Culture ID Panel (Reflexed)     Status: Abnormal   Collection Time: 06/18/22 10:34 PM  Result Value Ref Range Status   Enterococcus faecalis NOT DETECTED NOT DETECTED Final   Enterococcus Faecium NOT DETECTED NOT DETECTED Final   Listeria monocytogenes NOT DETECTED NOT DETECTED Final   Staphylococcus species DETECTED (A) NOT DETECTED Final    Comment: CRITICAL RESULT CALLED TO, READ BACK BY AND VERIFIED WITH: PHARMD NICK GLOGOVAC ON 06/19/22 @ 1734 BY DRT      Staphylococcus aureus (BCID) DETECTED (A) NOT DETECTED Final    Comment: CRITICAL RESULT CALLED TO, READ BACK BY AND VERIFIED WITH: PHARMD NICK GLOGOVAC ON 06/19/22 @ 1734 BY DRT      Staphylococcus epidermidis NOT DETECTED NOT DETECTED Final   Staphylococcus lugdunensis NOT DETECTED NOT DETECTED Final   Streptococcus  species NOT DETECTED NOT DETECTED Final   Streptococcus agalactiae NOT DETECTED NOT DETECTED Final   Streptococcus pneumoniae NOT DETECTED NOT DETECTED Final   Streptococcus pyogenes NOT DETECTED NOT DETECTED Final   A.calcoaceticus-baumannii NOT DETECTED NOT DETECTED Final   Bacteroides fragilis NOT DETECTED NOT DETECTED Final   Enterobacterales NOT DETECTED NOT DETECTED Final   Enterobacter cloacae complex NOT DETECTED NOT DETECTED Final   Escherichia coli NOT DETECTED NOT DETECTED Final   Klebsiella aerogenes NOT DETECTED NOT DETECTED Final   Klebsiella oxytoca NOT DETECTED NOT DETECTED Final   Klebsiella pneumoniae NOT DETECTED NOT DETECTED Final   Proteus species NOT DETECTED NOT DETECTED Final   Salmonella species NOT DETECTED NOT DETECTED Final   Serratia marcescens NOT DETECTED NOT DETECTED Final   Haemophilus influenzae NOT DETECTED NOT DETECTED Final   Neisseria meningitidis NOT DETECTED NOT DETECTED Final   Pseudomonas aeruginosa NOT DETECTED NOT DETECTED Final   Stenotrophomonas maltophilia NOT DETECTED NOT DETECTED Final   Candida albicans NOT DETECTED NOT DETECTED Final   Candida auris NOT DETECTED NOT DETECTED Final   Candida glabrata NOT DETECTED NOT DETECTED Final   Candida krusei NOT DETECTED NOT DETECTED Final   Candida parapsilosis NOT DETECTED NOT DETECTED Final   Candida tropicalis NOT DETECTED NOT DETECTED Final   Cryptococcus neoformans/gattii NOT DETECTED NOT DETECTED Final   Meth resistant mecA/C and MREJ NOT DETECTED NOT DETECTED Final    Comment: Performed at [facility], [address], [place], [place] [address]  MRSA Next Gen by PCR, Nasal     Status: None   Collection Time: 06/19/22  4:02 AM   Specimen: Nasal Mucosa; Nasal Swab  Result Value Ref Range Status   MRSA by PCR Next Gen NOT DETECTED NOT DETECTED Final    Comment: (NOTE) The GeneXpert MRSA Assay (FDA approved for NASAL specimens only), is one component of a comprehensive MRSA  colonization surveillance program. It is not intended to diagnose MRSA infection nor to guide or monitor treatment for MRSA infections. Test performance is not FDA approved in patients less than 73 years old. Performed at [facility], [place] [address] [place],  [address]   Culture, blood (Routine X 2) w Reflex to ID Panel     Status: None   Collection Time: 06/21/22  6:44 AM   Specimen: BLOOD LEFT HAND  Result Value Ref Range Status   Specimen Description   Final    BLOOD LEFT HAND Performed at [facility] [address], [place],  [address]    Special Requests  Final    BOTTLES DRAWN AEROBIC AND ANAEROBIC Blood Culture adequate volume Performed at [place] [address] [place], [place] [address]    Culture   Final    NO GROWTH 5 DAYS Performed at [facility], [place] [address], [place], [place] [address]    Report Status 06/26/2022 FINAL  Final  Culture, blood (Routine X 2) w Reflex to ID Panel     Status: None   Collection Time: 06/21/22  6:53 AM   Specimen: BLOOD RIGHT HAND  Result Value Ref Range Status   Specimen Description   Final    BLOOD RIGHT HAND Performed at [facility] [address] [place], [place] [address]    Special Requests   Final    BOTTLES DRAWN AEROBIC AND ANAEROBIC Blood Culture adequate volume Performed at [facility] [address] [place], [place] [address]    Culture   Final    NO GROWTH 5 DAYS Performed at [facility], [place] [address] [place], [place] [address]    Report Status 06/26/2022 FINAL  Final    Impression/Plan:  1. Positive blood cultures - just one positive culture bottle, repeat cultures negative.  On antibiotics and continue treatment through 10/24.   2.  Aspiration pneumonia - concern for aspiration based on CXR findings, fever, some luekocytosis.  Changed to ampicillin/sulbactam and recommend  3 days.  Can change back to cefazolin tomorrow.   3.  Alcoholic cirrhosis - coffee ground emesis and drop in Hgb.  Followed by GI.

## 2022-06-27 NOTE — Progress Notes (Signed)
Nutrition Follow-up  DOCUMENTATION CODES:   Not applicable  INTERVENTION:  -Begin trophic feeds of Osmolite 1.5 when clinically appropriate -Once hemodynamically stable and EN can advance, consider Vital 1.5 for better GI tolerance  NUTRITION DIAGNOSIS:  Inadequate oral intake related to lethargy/confusion as evidenced by NPO status.  GOAL:  Patient will meet greater than or equal to 90% of their needs  MONITOR:  TF tolerance, Diet advancement  REASON FOR ASSESSMENT:  Follow Up  ASSESSMENT:  Pt is a 41yo F with PMH of HTN, ETOH abuse, liver disease, hepatic encephalopathy, and cirrhosis who presents with with desire for alcohol cessation.  10/15: EN held abdominal distention and constipation 10/16: Pt had coffee ground emesis, NGT came out but was replaced. NGT placed to suction.  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 NGT was noted in the stomach but removed with the endoscope withdrawal. NGT replaced 10/18: NGT clamping trial.  Pt is day 4 inadequate protein and calorie intake. Per chart review, if pt able to tolerate clamping trial, plan to begin trophic feeds. Last known BM 10/17. Has lactulose ordered TID. Phenylephrine off as of 0900 today. MAP >65. Pt continues to require fiber free formula until pressors are off for >24hrs. Recommend trophic feeds of Osmolite 1.5 when clinically appropriate. Can consider Vital 1.5 for GI dysfunction when hemodynamically stable and EN to advance.  Diet Order:   Diet Order             Diet NPO time specified  Diet effective now                   EDUCATION NEEDS:  Not appropriate for education at this time  Skin:  Skin Assessment: Reviewed RN  Assessment  Last BM:  PTA  Height:  Ht Readings from Last 1 Encounters:  06/19/22 5' 4"  (1.626 m)    Weight:  Wt Readings from Last 1 Encounters:  06/27/22 80.6 kg    BMI:  Body mass index is 30.5 kg/m.  Estimated Nutritional Needs:  Kcal:  2115-2470kcal  Protein:  85-110g  Fluid:  1740m  KCandise Bowens MS, RD, LDN, CNSC See AMiON for contact information

## 2022-06-27 NOTE — Progress Notes (Signed)
Received in report that pt's abdomen appeared to be firmer after NG tube was clamped. Upon assessment of patient, she began retching intermittently. NG tube was placed back on low intermittent suction and Wyn Forster RN notified.

## 2022-06-28 DIAGNOSIS — G9340 Encephalopathy, unspecified: Secondary | ICD-10-CM | POA: Diagnosis not present

## 2022-06-28 LAB — GLUCOSE, CAPILLARY
Glucose-Capillary: 105 mg/dL — ABNORMAL HIGH (ref 70–99)
Glucose-Capillary: 96 mg/dL (ref 70–99)
Glucose-Capillary: 103 mg/dL — ABNORMAL HIGH (ref 70–99)
Glucose-Capillary: 109 mg/dL — ABNORMAL HIGH (ref 70–99)
Glucose-Capillary: 102 mg/dL — ABNORMAL HIGH (ref 70–99)
Glucose-Capillary: 101 mg/dL — ABNORMAL HIGH (ref 70–99)

## 2022-06-28 LAB — COMPREHENSIVE METABOLIC PANEL
ALT: 12 U/L (ref 0–44)
AST: 41 U/L (ref 15–41)
Albumin: 2.8 g/dL — ABNORMAL LOW (ref 3.5–5.0)
Alkaline Phosphatase: 76 U/L (ref 38–126)
Anion gap: 6 (ref 5–15)
BUN: 7 mg/dL (ref 6–20)
CO2: 22 mmol/L (ref 22–32)
Calcium: 7.9 mg/dL — ABNORMAL LOW (ref 8.9–10.3)
Chloride: 117 mmol/L — ABNORMAL HIGH (ref 98–111)
Creatinine, Ser: 0.44 mg/dL (ref 0.44–1.00)
GFR, Estimated: >60 mL/min (ref >60)
Glucose, Bld: 109 mg/dL — ABNORMAL HIGH (ref 70–99)
Potassium: 3.3 mmol/L — ABNORMAL LOW (ref 3.5–5.1)
Sodium: 145 mmol/L (ref 135–145)
Total Bilirubin: 2.2 mg/dL — ABNORMAL HIGH (ref 0.3–1.2)
Total Protein: 6 g/dL — ABNORMAL LOW (ref 6.5–8.1)

## 2022-06-28 LAB — CBC
HCT: 26.8 % — ABNORMAL LOW (ref 36.0–46.0)
Hemoglobin: 7.6 g/dL — ABNORMAL LOW (ref 12.0–15.0)
MCH: 25.4 pg — ABNORMAL LOW (ref 26.0–34.0)
MCHC: 28.4 g/dL — ABNORMAL LOW (ref 30.0–36.0)
MCV: 89.6 fL (ref 80.0–100.0)
Platelets: 68 10*3/uL — ABNORMAL LOW (ref 150–400)
RBC: 2.99 MIL/uL — ABNORMAL LOW (ref 3.87–5.11)
RDW: 27.2 % — ABNORMAL HIGH (ref 11.5–15.5)
WBC: 5.8 10*3/uL (ref 4.0–10.5)
nRBC: 0.3 % — ABNORMAL HIGH (ref 0.0–0.2)

## 2022-06-28 LAB — BASIC METABOLIC PANEL
Anion gap: 3 — ABNORMAL LOW (ref 5–15)
BUN: 7 mg/dL (ref 6–20)
CO2: 23 mmol/L (ref 22–32)
Calcium: 7.8 mg/dL — ABNORMAL LOW (ref 8.9–10.3)
Chloride: 118 mmol/L — ABNORMAL HIGH (ref 98–111)
Creatinine, Ser: 0.46 mg/dL (ref 0.44–1.00)
GFR, Estimated: >60 mL/min (ref >60)
Glucose, Bld: 121 mg/dL — ABNORMAL HIGH (ref 70–99)
Potassium: 3.8 mmol/L (ref 3.5–5.1)
Sodium: 144 mmol/L (ref 135–145)

## 2022-06-28 LAB — PROTIME-INR
INR: 1.7 — ABNORMAL HIGH (ref 0.8–1.2)
Prothrombin Time: 19.7 seconds — ABNORMAL HIGH (ref 11.4–15.2)

## 2022-06-28 MED ORDER — FUROSEMIDE 10 MG/ML IJ SOLN
40.0000 mg | Freq: Once | INTRAMUSCULAR | Status: AC
  Administered 2022-06-28: 40 mg via INTRAVENOUS
  Filled 2022-06-28: qty 4

## 2022-06-28 MED ORDER — POTASSIUM CHLORIDE 10 MEQ/50ML IV SOLN
10.0000 meq | INTRAVENOUS | Status: AC
  Administered 2022-06-28 (×6): 10 meq via INTRAVENOUS
  Filled 2022-06-28 (×6): qty 50

## 2022-06-28 MED ORDER — LACTULOSE 10 GM/15ML PO SOLN
30.0000 g | Freq: Three times a day (TID) | ORAL | Status: DC
  Administered 2022-06-28 – 2022-06-29 (×5): 30 g
  Filled 2022-06-28 (×5): qty 60
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
## 2022-06-28 NOTE — Progress Notes (Signed)
[facility] ADULT ICU REPLACEMENT PROTOCOL   The patient does apply for the [facility] Adult ICU Electrolyte Replacment Protocol based on the criteria listed below:   1.Exclusion criteria: TCTS patients, ECMO patients, and Dialysis patients 2. Is GFR >/= 30 ml/min? Yes.    Patient's GFR today is >60 3. Is SCr </= 2? Yes.   Patient's SCr is 0.44 mg/dL 4. Did SCr increase >/= 0.5 in 24 hours? No. 5.Pt's weight >40kg  Yes.   6. Abnormal electrolyte(s): K+ 3.3  7. Electrolytes replaced per protocol 8.  Call MD STAT for K+ </= 2.5, Phos </= 1, or Mag </= 1 Physician:  Leata Mouse [facility] 06/28/2022 4:48 AM

## 2022-06-28 NOTE — Progress Notes (Addendum)
Attending physician's note   I have taken a history, reviewed the chart, and examined the patient. I performed a substantive portion of this encounter, including complete performance of at least one of the key components, in conjunction with the APP. I agree with the APP's note, impression, and recommendations with my edits.   Did have some symptoms after NG to clamp overnight.  Trialing clamp again today.  If tolerating, may consider trialing trickle feeds.  If concern for third spacing, can treat with albumin with Lasix chase, but ultimately defer to primary CCS on management of hemodynamics and volume status.  Caitland Porchia, DO, FACG [phone] office          Progress Note   Subjective  Primary GI Dr. 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  Per nursing staff she had her NG tube clamped all day yesterday and seemed to be doing okay but then overnight started to complain of some nausea and it was witnessed that her abdomen was growing increasingly distended so her NG tube was unclamped.  Per nurse he is worried about third spacing and has asked the hospitalist about Albumin first paracentesis.  Also discussing decreasing Lactulose as she seems to be having copious amounts of stool, though per nursing staff this seems to be more solid than previous.   Objective   Vital signs in last 24 hours: Temp:  [97.5 F (36.4 C)-99.1 F (37.3 C)] 99 F (37.2 C) (10/19 0700) Pulse Rate:  [72-109] 83 (10/19 0800) Resp:  [13-22] 17 (10/19 0800) BP: (88-130)/(49-97) 108/71 (10/19 0800) SpO2:  [93 %-100 %] 100 % (10/19 0800) Last BM Date : 06/27/22 General:    Chronically ill-appearing, African-American female in NAD, NG tube  in place with 50 cc of bilious material Heart:  Regular rate and rhythm; no murmurs Lungs: Respirations even and unlabored, lungs CTA bilaterally Abdomen:  Soft, mild to moderate TTP generalized, mild to moderate distention,. Normal bowel sounds. Extremities:  Without edema. Neurologic: Mental status continues to improve, still lethargic  Intake/Output from previous day: 10/18 0701 - 10/19 0700 In: 90 [I.V.:60] Out: 2305 [Urine:1000; Emesis/NG output:500; Stool:805] Intake/Output this shift: Total I/O In: 40 [I.V.:40] Out: -   Lab Results: Recent Labs    06/26/22 0225 06/26/22 0853 06/26/22 1958 06/27/22 0357 06/28/22 0354  WBC 6.1  --   --  4.4 5.8  HGB 7.0*   < > 6.6* 7.8* 7.6*  HCT 24.9*   < > 24.1* 27.0* 26.8*  PLT 73*  --   --  61* 68*   < > = values in this interval not displayed.   BMET Recent Labs    06/26/22 1958 06/27/22 0357 06/28/22 0354  NA 144 145 145  K 3.5 3.9 3.3*  CL 119* 118* 117*  CO2 20* 22 22  GLUCOSE 119* 116* 109*  BUN 7 6 7   CREATININE 0.32* 0.35* 0.44  CALCIUM 7.7* 7.9* 7.9*   LFT Recent Labs    06/27/22 0357 06/28/22 0354  PROT 6.4* 6.0*  ALBUMIN 3.1* 2.8*  AST 45* 41  ALT 12 12  ALKPHOS 74 76  BILITOT 1.9* 2.2*  BILIDIR 0.9*  --   IBILI 1.0*  --    PT/INR Recent Labs  06/26/22 1224 06/27/22 0357  LABPROT 18.8* 20.1*  INR 1.6* 1.7*    Studies/Results: DG Abd 1 View  Result Date: 06/26/2022 CLINICAL DATA:  NG-tube EXAM: ABDOMEN - 1 VIEW COMPARISON:  Abdominal x-ray 06/25/2022 FINDINGS: Nasogastric tube tip is in the mid stomach. Gaseous distention of colon in the upper abdomen persists. Cholecystectomy clips are present. IMPRESSION: Nasogastric tube tip is in the mid stomach. Electronically Signed   By: Ronney Asters M.D.   On: 06/26/2022 18:37     Assessment / Plan:   Assessment: 1.  Decompensated cirrhosis secondary to alcohol: MELD-NA (18 on admission), down to 15, steroids stopped day 6 due to possible  aspiration pneumonia 2.  Blood/coffee-ground emesis: and NG tube pulling out core track with drop in hemoglobin from 10-7, EGD 06/26/2022 with grade 1 esophageal varices, cratered esophageal ulcer likely from core track and moderate portal hypertensive gastropathy, hemoglobin stable 3.  Hepatic encephalopathy: Current Lialda lactulose 30 mils 3 times daily and Rifaximin 550 mg twice daily per tube, seems to be improving 4.  MSSA bacteremia with possible aspiration pneumonia: Currently on Unasyn, afebrile, on 3 L nasal cannula 5.  Coagulopathy: We will need to recheck INR today 6.  Thrombocytopenia: Secondary to cirrhosis  Plan: 1.  Continue NG tube for now 2.  Consider IV albumin 3.  For now as patient has rectal tube in place would continue Lactulose as ordered 4.  Continue other supportive measures 5.  Continue daily CBC, CMP and INR 6.  CIWA protocol 7.  Empiric treatment with folic acid, multivitamin and thiamine 8.  Will discuss further recommendations with Dr. Bryan Lemma  Thank you for your kind consultation, we will continue to follow.   LOS: 10 days   Levin Erp  06/28/2022, 9:48 AM

## 2022-06-29 ENCOUNTER — Telehealth: Payer: Self-pay

## 2022-06-29 DIAGNOSIS — B9561 Methicillin susceptible Staphylococcus aureus infection as the cause of diseases classified elsewhere: Secondary | ICD-10-CM

## 2022-06-29 DIAGNOSIS — K7010 Alcoholic hepatitis without ascites: Secondary | ICD-10-CM | POA: Diagnosis not present

## 2022-06-29 DIAGNOSIS — K7682 Hepatic encephalopathy: Secondary | ICD-10-CM | POA: Diagnosis not present

## 2022-06-29 DIAGNOSIS — R7881 Bacteremia: Secondary | ICD-10-CM

## 2022-06-29 DIAGNOSIS — G9340 Encephalopathy, unspecified: Secondary | ICD-10-CM | POA: Diagnosis not present

## 2022-06-29 LAB — COMPREHENSIVE METABOLIC PANEL
ALT: 10 U/L (ref 0–44)
AST: 39 U/L (ref 15–41)
Albumin: 3 g/dL — ABNORMAL LOW (ref 3.5–5.0)
Alkaline Phosphatase: 100 U/L (ref 38–126)
Anion gap: 9 (ref 5–15)
BUN: 5 mg/dL — ABNORMAL LOW (ref 6–20)
CO2: 21 mmol/L — ABNORMAL LOW (ref 22–32)
Calcium: 8.1 mg/dL — ABNORMAL LOW (ref 8.9–10.3)
Chloride: 114 mmol/L — ABNORMAL HIGH (ref 98–111)
Creatinine, Ser: 0.47 mg/dL (ref 0.44–1.00)
GFR, Estimated: >60 mL/min (ref >60)
Glucose, Bld: 103 mg/dL — ABNORMAL HIGH (ref 70–99)
Potassium: 2.8 mmol/L — ABNORMAL LOW (ref 3.5–5.1)
Sodium: 144 mmol/L (ref 135–145)
Total Bilirubin: 2.2 mg/dL — ABNORMAL HIGH (ref 0.3–1.2)
Total Protein: 6.8 g/dL (ref 6.5–8.1)

## 2022-06-29 LAB — CBC
HCT: 28.1 % — ABNORMAL LOW (ref 36.0–46.0)
Hemoglobin: 8.1 g/dL — ABNORMAL LOW (ref 12.0–15.0)
MCH: 25.3 pg — ABNORMAL LOW (ref 26.0–34.0)
MCHC: 28.8 g/dL — ABNORMAL LOW (ref 30.0–36.0)
MCV: 87.8 fL (ref 80.0–100.0)
Platelets: 94 10*3/uL — ABNORMAL LOW (ref 150–400)
RBC: 3.2 MIL/uL — ABNORMAL LOW (ref 3.87–5.11)
RDW: 27.6 % — ABNORMAL HIGH (ref 11.5–15.5)
WBC: 9.4 10*3/uL (ref 4.0–10.5)
nRBC: 0.2 % (ref 0.0–0.2)

## 2022-06-29 LAB — GLUCOSE, CAPILLARY
Glucose-Capillary: 102 mg/dL — ABNORMAL HIGH (ref 70–99)
Glucose-Capillary: 105 mg/dL — ABNORMAL HIGH (ref 70–99)
Glucose-Capillary: 107 mg/dL — ABNORMAL HIGH (ref 70–99)
Glucose-Capillary: 134 mg/dL — ABNORMAL HIGH (ref 70–99)
Glucose-Capillary: 135 mg/dL — ABNORMAL HIGH (ref 70–99)
Glucose-Capillary: 98 mg/dL (ref 70–99)

## 2022-06-29 LAB — BASIC METABOLIC PANEL
Anion gap: 7 (ref 5–15)
BUN: <5 mg/dL — ABNORMAL LOW (ref 6–20)
CO2: 23 mmol/L (ref 22–32)
Calcium: 8.3 mg/dL — ABNORMAL LOW (ref 8.9–10.3)
Chloride: 109 mmol/L (ref 98–111)
Creatinine, Ser: 0.53 mg/dL (ref 0.44–1.00)
GFR, Estimated: >60 mL/min (ref >60)
Glucose, Bld: 149 mg/dL — ABNORMAL HIGH (ref 70–99)
Potassium: 2.9 mmol/L — ABNORMAL LOW (ref 3.5–5.1)
Sodium: 139 mmol/L (ref 135–145)

## 2022-06-29 LAB — PROTIME-INR
INR: 1.7 — ABNORMAL HIGH (ref 0.8–1.2)
Prothrombin Time: 19.8 seconds — ABNORMAL HIGH (ref 11.4–15.2)

## 2022-06-29 LAB — AMMONIA: Ammonia: 101 umol/L — ABNORMAL HIGH (ref 9–35)

## 2022-06-29 LAB — MAGNESIUM: Magnesium: 1.5 mg/dL — ABNORMAL LOW (ref 1.7–2.4)

## 2022-06-29 MED ORDER — POTASSIUM CHLORIDE 10 MEQ/50ML IV SOLN
10.0000 meq | INTRAVENOUS | Status: AC
  Administered 2022-06-29 (×5): 10 meq via INTRAVENOUS
  Filled 2022-06-29 (×5): qty 50

## 2022-06-29 MED ORDER — ALBUMIN HUMAN 25 % IV SOLN
25.0000 g | Freq: Once | INTRAVENOUS | Status: AC
  Administered 2022-06-29: 25 g via INTRAVENOUS
  Filled 2022-06-29: qty 100

## 2022-06-29 MED ORDER — LABETALOL HCL 5 MG/ML IV SOLN: 20.0000 mg | INTRAVENOUS | Status: DC | PRN

## 2022-06-29 MED ORDER — POTASSIUM CHLORIDE 20 MEQ PO PACK
40.0000 meq | PACK | ORAL | Status: AC
  Administered 2022-06-29 – 2022-06-30 (×2): 40 meq
  Filled 2022-06-29 (×2): qty 2

## 2022-06-29 MED ORDER — FUROSEMIDE 10 MG/ML IJ SOLN
40.0000 mg | Freq: Once | INTRAMUSCULAR | Status: AC
  Administered 2022-06-29: 40 mg via INTRAVENOUS
  Filled 2022-06-29: qty 4

## 2022-06-29 MED ORDER — BOOST / RESOURCE BREEZE PO LIQD CUSTOM
1.0000 | Freq: Three times a day (TID) | ORAL | Status: DC
  Administered 2022-06-29 – 2022-07-02 (×9): 1 via ORAL

## 2022-06-29 MED ORDER — HYDRALAZINE HCL 20 MG/ML IJ SOLN: 10.0000 mg | INTRAMUSCULAR | Status: DC | PRN

## 2022-06-29 MED ORDER — POTASSIUM CHLORIDE 10 MEQ/50ML IV SOLN
INTRAVENOUS | Status: AC
  Administered 2022-06-29: 10 meq via INTRAVENOUS
  Filled 2022-06-29: qty 50

## 2022-06-29 MED ORDER — POTASSIUM CHLORIDE 20 MEQ PO PACK
40.0000 meq | PACK | Freq: Once | ORAL | Status: AC
  Administered 2022-06-29: 40 meq
  Filled 2022-06-29: qty 2

## 2022-06-29 MED ORDER — ORAL CARE MOUTH RINSE: 15.0000 mL | OROMUCOSAL | Status: DC | PRN

## 2022-06-29 MED ORDER — MAGNESIUM SULFATE 4 GM/100ML IV SOLN
4.0000 g | Freq: Once | INTRAVENOUS | Status: AC
  Administered 2022-06-29: 4 g via INTRAVENOUS
  Filled 2022-06-29: qty 100

## 2022-06-29 MED ORDER — CARVEDILOL 3.125 MG PO TABS
3.1250 mg | ORAL_TABLET | Freq: Two times a day (BID) | ORAL | Status: DC
  Administered 2022-06-29 – 2022-07-03 (×9): 3.125 mg via ORAL
  Filled 2022-06-29 (×9): qty 1

## 2022-06-29 NOTE — Telephone Encounter (Signed)
-----   Message from [place], [place] sent at 06/29/2022 11:14 AM EDT ----- Regarding: appt Please schedule follow-up appointment with Dr. Rush Landmark if able in the next 4 to 6 weeks for alcoholic cirrhosis.  If nothing available with Dr. Rush Landmark then needs an appointment with Vicie Mutters in the next 3 to 4 weeks.  Thanks, JLL

## 2022-06-29 NOTE — Progress Notes (Addendum)
Patient persistently tachycardic since coming off precedex 10/19 and progressively hypertensive with SBP 170's.    - diuresed 3L plus unmeasured urinary occurrence x1 after lasix yesterday, but remains grossly positive +16L, wts remain up since admit. - K 2.8 on am labs > getting 6 runs of KCL - Mag 1.5  P:  - Mag 4gm now - additional KCL 40 meq PO  - albumin 25% f/b lasix 28m IV - discussed with GI, given her cirrhosis, esophageal varices, and moderate portal hypertensive gastropathy> will add coreg 3.1241mBID.  If tolerating consider switching to oral lasix and add low dose aldactone   Addendum> notified that patient vomited after drinking ensure fast, emesis was non-bloody.  Will hold off on NGT removal for now. Keep NPO for several hours and then may try liquids again and advance as tolerated     BrKennieth RadMSN, AG-ACNP-BC [facility] 06/29/2022, 3:21 PM  See Amion for pager If no response to pager, please call PCCM consult pager After 7:00 pm call Elink

## 2022-06-29 NOTE — Telephone Encounter (Signed)
Dr. Rush Landmark does not have any availability. Pt has been scheduled for a hospital follow up with Vicie Mutters, PA-C on Tuesday, 07/31/22 at 10 am. Appt information sent to Ellouise Newer, PA-C and should be included on hospital discharge summary. I will send appt information to patient via MyChart and I will place a copy in the mail.

## 2022-06-29 NOTE — Progress Notes (Signed)
eLink Physician-Brief Progress Note Patient Name: Joann [name] DOB: 04-01-1981 MRN: [id number]   Date of Service  06/29/2022  HPI/Events of Note  K 2.9, Cr 0.53 @ 2033 despite replacement this am. Received furosemide Mg 1.5 this morning received 4 g Mg  eICU Interventions  Ordered potassium 40 meqs q 4 x 2 doses     Intervention Category Intermediate Interventions: Electrolyte abnormality - evaluation and management  Judd Lien 06/29/2022, 11:11 PM

## 2022-06-29 NOTE — Progress Notes (Signed)
NAME:  Joann [name], MRN:  [id number], DOB:  04/02/1981, LOS: 72 ADMISSION DATE:  06/18/2022, CONSULTATION DATE:  06/19/22 REFERRING MD:  EDP, CHIEF COMPLAINT:  acute etoh withdrawal   BRIEF  41 yo female with hx of ETOH cirrhosis brought to ER with weakness, confusion and fatigue after trying to detox at home.  Her ETOH level was 158 and ammonia level 150's in the ER.  PCCM consulted to assess for ICU admission to treat acute toxic/metabolic encephalopathy.  Pertinent  Medical History  ETOH, cirrhosis, hepatic encephalopathy, HTN, Anxiety, Delirium tremens, Pancreatitis from ETOH, Alcoholic ketoacidosis  Significant Hospital Events: Including procedures, antibiotic start and stop dates in addition to other pertinent events   10/09 Admit 10/10 started on precedex, phenylephrine; start on steroids for acute alcohol hepatitis; MSSA bacteremia >> start ABx 10/11 informed pt hospital administration it is okay to share information with pt's husband (pt had red lock in the chart); solumedrol stopped; GI and ID consulted 10/12 transfuse 1 unit PRBC 10/13 Legal team now recommends that pt's husband not be provided medical information 10/16 NG tube out; concern for aspiration; ancef stopped due concern about cephalosporin induced encephalopathy, and started on zosyn for aspiration and MSSA bacteremia; steroids stopped due concern about infection 10/17 EGD >> ulcer in esophagus, small varix 10/18 off precedex, phenylephrine, octreotide and protonix infusions  Interim History / Subjective:  More alert this morning.  Did realize how long she has been in hospital for.  Denies chest pain, nausea, abdominal pain.  Wants some water.  Objective   Blood pressure (!) 147/91, pulse (!) 126, temperature 100 F (37.8 C), temperature source Oral, resp. rate (!) 22, height 5' 4"  (1.626 m), weight 75.2 kg, SpO2 94 %.        Intake/Output Summary (Last 24 hours) at 06/29/2022 0739 Last data filed at 06/29/2022  0600 Gross per 24 hour  Intake 2070.15 ml  Output 4383 ml  Net -2312.85 ml    Filed Weights   06/26/22 0500 06/27/22 0500 06/29/22 0500  Weight: 82 kg 80.6 kg 75.2 kg   General - alert Eyes - pupils reactive ENT - no sinus tenderness, no stridor, NG tube in place Cardiac - regular rate/rhythm, no murmur Chest - equal breath sounds b/l, no wheezing or rales Abdomen - soft, non tender, + bowel sounds Extremities - no cyanosis, clubbing, or edema Skin - no rashes Neuro - normal strength, moves extremities, follows commands Psych - normal mood and behavior  Resolved Hospital Problem list   Shock - from sepsis, hypovolemia, sedation  Assessment & Plan:   Acute toxic/metabolic encephalopathy. Hx of Anxiety. - from alcohol withdrawal, hepatic encephalopathy, and sepsis with bacteremia - mental status improved on 10/20 - try to maintain off precedex - continue lactulose tid for now - continue rifaximin, thiamine, folic acid, zinc - continue zyprexa - prn haldol  Sepsis from MSSA bacteremia. Aspiration pneumonitis. - continue ABx through 10/24 - changed back to ancef on 08/65  Alcoholic cirrhosis with acute decompensation. Coffee ground emesis with grade 1 esophageal varices, and esophageal ulcer likely from cortrak. Moderate portal hypertensive gastropathy. - GI consulted  Ileus. Dysphagia. - improved clinically on 10/20 - bed side swallow and then trial of clears if she does okay; otherwise will need speech assessment - keep NG tube in for now  Hypokalemia. - replace as needed - f/u BMET, Mg, Ph  Anemia of critical illness and ABLA from Upper GI bleeding. Thrombocytopenia in setting of sepsis and cirrhosis. -  f/u CBC - transfuse for Hb < 7  Deconditioning. - PT/OT assessment  Best Practice (right click and "Reselect all SmartList Selections" daily)  Diet/type: Advance to clears as tolerated DVT prophylaxis: SCDs GI prophylaxis: Protonix Lines: Rt arm PICC  line 06/20/22  Foley:  N/A  Code Status:  full code  Labs:      Latest Ref Rng & Units 06/29/2022    5:37 AM 06/28/2022    2:47 PM 06/28/2022    3:54 AM  CMP  Glucose 70 - 99 mg/dL 103  121  109   BUN 6 - 20 mg/dL 5  7  7    Creatinine 0.44 - 1.00 mg/dL 0.47  0.46  0.44   Sodium 135 - 145 mmol/L 144  144  145   Potassium 3.5 - 5.1 mmol/L 2.8  3.8  3.3   Chloride 98 - 111 mmol/L 114  118  117   CO2 22 - 32 mmol/L 21  23  22    Calcium 8.9 - 10.3 mg/dL 8.1  7.8  7.9   Total Protein 6.5 - 8.1 g/dL 6.8   6.0   Total Bilirubin 0.3 - 1.2 mg/dL 2.2   2.2   Alkaline Phos 38 - 126 U/L 100   76   AST 15 - 41 U/L 39   41   ALT 0 - 44 U/L 10   12        Latest Ref Rng & Units 06/29/2022    5:37 AM 06/28/2022    3:54 AM 06/27/2022    3:57 AM  CBC  WBC 4.0 - 10.5 K/uL 9.4  5.8  4.4   Hemoglobin 12.0 - 15.0 g/dL 8.1  7.6  7.8   Hematocrit 36.0 - 46.0 % 28.1  26.8  27.0   Platelets 150 - 400 K/uL 94  68  61     ABG    Component Value Date/Time   PHART 7.448 12/31/2019 0826   PCO2ART 38.2 12/31/2019 0826   PO2ART 346 (H) 12/31/2019 0826   HCO3 26.3 12/31/2019 0826   TCO2 27 12/31/2019 0826   ACIDBASEDEF 2.5 (H) 12/30/2019 1655   O2SAT 100.0 12/31/2019 0826    CBG (last 3)  Recent Labs    06/28/22 2344 06/29/22 0326 06/29/22 0756  GLUCAP 105* 98 102*    Signature:  Chesley Mires, MD [facility] Pager - [phone], or [phone] 06/29/2022, 8:05 AM

## 2022-06-29 NOTE — Progress Notes (Addendum)
Attending physician's note   I have taken a history, reviewed the chart, and examined the patient. I performed a substantive portion of this encounter, including complete performance of at least one of the key components, in conjunction with the APP. I agree with the APP's note, impression, and recommendations with my edits.   Mental status continues to improve.  Did tolerate liquids earlier today.  Some mild nausea, but no emesis.  She is hoping to remove NG tube soon.  Child-Pugh: B MELD 3.0: 17  - Try clamping NG tube and see how she tolerates liquids.  If tolerating, can hopefully remove NGT later today or tomorrow - Low-sodium diet - Continue Protonix and can hopefully transition to Protonix 40 mg po bid if reliably tolerating p.o. - Continue Carafate - Continue lactulose and rifaximin.  Titrate lactulose to 2-4 soft stools daily - Now off Precedex and BP a little elevated today.  If planning on starting antihypertensive therapy, reasonable to start with beta-blocker given presence of esophageal varices and portal hypertensive gastropathy.  If starting antihypertensive, recommend carvedilol from a hepatology standpoint  - If concern for third spacing, recommend albumin 25% with Lasix IV chase - No ascites noted on recent imaging - ABX per primary CCS and consulting ID service - Continue daily CBC and BMP - GI service will follow peripherally.  Please do not hesitate to contact our service with additional questions or concerns  Erica Richwine, DO, FACG [phone] office          Progress Note   Subjective  Primary GI Dr. 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  Per nursing able to tolerate a full glass of water, plans to remove NG tube today and see how she does.  No other new complaints or concerns.   Objective   Vital signs in last  24 hours: Temp:  [97.9 F (36.6 C)-100 F (37.8 C)] 98.9 F (37.2 C) (10/20 0800) Pulse Rate:  [57-134] 126 (10/20 0600) Resp:  [8-34] 22 (10/20 0600) BP: (87-149)/(53-91) 147/91 (10/20 0600) SpO2:  [92 %-100 %] 94 % (10/20 0600) Weight:  [75.2 kg] 75.2 kg (10/20 0500) Last BM Date : 06/28/22 General:   Chronically and acutely ill-appearing African-American female in NAD, NG tube in place (clamped) Heart:  Regular rate and rhythm; no murmurs Lungs: Respirations even and unlabored, lungs CTA bilaterally Abdomen:  Soft, mild generalized TTP and mild distention. Normal bowel sounds. Psych:  Cooperative. Normal mood and affect.  Slowed mentation, but much better than the past 48 hours  Intake/Output from previous day: 10/19 0701 - 10/20 0700 In: 2070.2 [I.V.:1595.5; IV Piggyback:444.7] Out: 8563 [Urine:3000; Emesis/NG output:1225; Stool:158]   Lab Results: Recent Labs    06/27/22 0357 06/28/22 0354 06/29/22 0537  WBC 4.4 5.8 9.4  HGB 7.8* 7.6* 8.1*  HCT 27.0* 26.8* 28.1*  PLT 61* 68* 94*   BMET Recent Labs    06/28/22 0354 06/28/22 1447 06/29/22 0537  NA 145 144 144  K 3.3* 3.8 2.8*  CL 117* 118* 114*  CO2 22 23 21*  GLUCOSE 109* 121* 103*  BUN 7 7 5*  CREATININE 0.44 0.46 0.47  CALCIUM 7.9* 7.8* 8.1*   LFT Recent Labs    06/27/22 0357 06/28/22 0354 06/29/22 0537  PROT 6.4*   < > 6.8  ALBUMIN 3.1*   < > 3.0*  AST 45*   < > 39  ALT 12   < > 10  ALKPHOS 74   < > 100  BILITOT 1.9*   < > 2.2*  BILIDIR 0.9*  --   --   IBILI 1.0*  --   --    < > = values in this interval not displayed.   PT/INR Recent Labs    06/28/22 1447 06/29/22 0537  LABPROT 19.7* 19.8*  INR 1.7* 1.7*     Assessment / Plan:   Assessment: 1.  Decompensated cirrhosis secondary to alcohol: MELD-NA 18 on admission, steroids stopped day 6 due to possible aspiration pneumonia, seems to be him proving slowly 2.  Blood/coffee-ground emesis: EGD 06/26/2022 with grade 1 esophageal varices,  cratered esophageal ulcer likely from core track and moderate portal hypertensive gastropathy, hemoglobin now stable 3.  Hepatic encephalopathy: Improving slowly on Lactulose 30 mL's 3 times a day and Rifaximin 550 twice daily 4.  MSSA bacteremia with possible aspiration pneumonia 5.  Coagulopathy 6.  Thrombocytopenia  Plan: 1.  Okay with NG tube removal today and clear liquids and then advance his diet as tolerated to low-sodium less than 2 g 2.  Continue other supportive measures 3.  Continue daily CBC, CMP and INR 4.  Once patient is discharged she will need to follow-up in our clinic in 3 to 4 weeks, we will try to get her an appointment with Dr. Rush Landmark  Thank you for kind consultation, we will likely sign off.   LOS: 11 days   Levin Erp  06/29/2022, 11:08 AM

## 2022-06-29 NOTE — Progress Notes (Signed)
    [facility] for Infectious Disease   Reason for visit: Follow up on bacteremia  Interval History: repeat blood cultures have remained negative; WBC wnl. Tmax 100.0.     Physical Exam: Constitutional:  Vitals:   06/29/22 0600 06/29/22 0800  BP: (!) 147/91   Pulse: (!) 126   Resp: (!) 22   Temp:  98.9 F (37.2 C)  SpO2: 94%    patient appears in NAD Respiratory: Normal respiratory effort  Review of Systems: Unable to be assessed due to patient factors  Lab Results  Component Value Date   WBC 9.4 06/29/2022   HGB 8.1 (L) 06/29/2022   HCT 28.1 (L) 06/29/2022   MCV 87.8 06/29/2022   PLT 94 (L) 06/29/2022    Lab Results  Component Value Date   CREATININE 0.47 06/29/2022   BUN 5 (L) 06/29/2022   NA 144 06/29/2022   K 2.8 (L) 06/29/2022   CL 114 (H) 06/29/2022   CO2 21 (L) 06/29/2022    Lab Results  Component Value Date   ALT 10 06/29/2022   AST 39 06/29/2022   ALKPHOS 100 06/29/2022     Microbiology: Recent Results (from the past 240 hour(s))  Culture, blood (Routine X 2) w Reflex to ID Panel     Status: None   Collection Time: 06/21/22  6:44 AM   Specimen: BLOOD LEFT HAND  Result Value Ref Range Status   Specimen Description   Final    BLOOD LEFT HAND Performed at [facility], [place] [address], [place], [place] [address]    Special Requests   Final    BOTTLES DRAWN AEROBIC AND ANAEROBIC Blood Culture adequate volume Performed at [facility] [address], [place], [place] [address]    Culture   Final    NO GROWTH 5 DAYS Performed at [facility], [place] [address], [place], [place] [address]    Report Status 06/26/2022 FINAL  Final  Culture, blood (Routine X 2) w Reflex to ID Panel     Status: None   Collection Time: 06/21/22  6:53 AM   Specimen: BLOOD RIGHT HAND  Result Value Ref Range Status   Specimen Description   Final    BLOOD RIGHT HAND Performed at [facility]  [address] [place], [place] [address]    Special Requests   Final    BOTTLES DRAWN AEROBIC AND ANAEROBIC Blood Culture adequate volume Performed at [facility] [address] [place], [place] [address]    Culture   Final    NO GROWTH 5 DAYS Performed at [facility], [place] [address], [place],  [address]    Report Status 06/26/2022 FINAL  Final    Impression/Plan:  1. Bacteremia - no new positive cultures and otherwise stable with MSSA positive blood culture.  No changes otherwise with plan to continue antibiotics through 10/26 then stop.    2.  Aspiration pneumonitis - s/p treatment with ampicillin/sulbactam x 3 days and now back to cefazolin.  Stable at this point.   3.  Alcoholic cirrhosis - followed by GI and decompensated liver disease.  + thrombocytopenia.    I will sign off, call with any questions

## 2022-06-29 NOTE — Progress Notes (Addendum)
Nutrition Follow-up  DOCUMENTATION CODES:  Not applicable  INTERVENTION:  -Advance diet as tolerated -Provide Boost breeze TID (250kcal and 9g protein/bottle) -As diet advances, recommend transitioning ONS to Ensure Plus HP as tolerated (350kcal, 20g protein) -If diet cannot be advance to solid foods in the next 1-2 days, recommend resuming EN or consider PN if bowel rest is needed.  NUTRITION DIAGNOSIS:  Inadequate oral intake related to lethargy/confusion as evidenced by NPO status.  GOAL:  Patient will meet greater than or equal to 90% of their needs progressing  MONITOR:  Follow Up-diet progression  REASON FOR ASSESSMENT:  Consult Enteral/tube feeding initiation and management  ASSESSMENT:  Pt is a 41yo F with PMH of HTN, ETOH abuse, liver disease, hepatic encephalopathy, and cirrhosis who presents with with desire for alcohol cessation.  Mental status improved. Pt off pressors. Pt with trial of clear liquids today. Plan to advance diet as tolerated to 2g Na. Recommend initiating Boost Breeze TID to provide 250kcal and 9g protein/bottle while on CLD. When diet is advanced, recommend transitioning ONS to Ensure Plus HP to provide 350kcal and 20g protein/bottle. If diet cannot be advanced to solid foods within the next 1-2 days, consider resuming EN or considering PN if bowel rest is needed.   If EN is needed, recommend goal rate of Vital 1.5 @ 33m/hr x 24hrs (14427mtotal volume) to provide 2160kcal, 97g protein and 110074mree water.  Diet Order:   Diet Order             Diet clear liquid Room service appropriate? Yes; Fluid consistency: Thin  Diet effective now                   EDUCATION NEEDS:   Not appropriate for education at this time  Skin:  Skin Assessment: Reviewed RN Assessment  Last BM:  PTA  Height:  Ht Readings from Last 1 Encounters:  06/19/22 5' 4"  (1.626 m)    Weight:  Wt Readings from Last 1 Encounters:  06/29/22 75.2 kg    BMI:   Body mass index is 28.46 kg/m.  Estimated Nutritional Needs:  Kcal:  2115-2470kcal  Protein:  85-110g  Fluid:  [id number] Bowens, RD, LDN, CNSC See AMiON for contact information

## 2022-06-30 DIAGNOSIS — G9340 Encephalopathy, unspecified: Secondary | ICD-10-CM | POA: Diagnosis not present

## 2022-06-30 DIAGNOSIS — F19931 Other psychoactive substance use, unspecified with withdrawal delirium: Secondary | ICD-10-CM | POA: Diagnosis not present

## 2022-06-30 LAB — COMPREHENSIVE METABOLIC PANEL
ALT: 10 U/L (ref 0–44)
AST: 37 U/L (ref 15–41)
Albumin: 3.5 g/dL (ref 3.5–5.0)
Alkaline Phosphatase: 99 U/L (ref 38–126)
Anion gap: 7 (ref 5–15)
BUN: <5 mg/dL — ABNORMAL LOW (ref 6–20)
CO2: 21 mmol/L — ABNORMAL LOW (ref 22–32)
Calcium: 8.4 mg/dL — ABNORMAL LOW (ref 8.9–10.3)
Chloride: 114 mmol/L — ABNORMAL HIGH (ref 98–111)
Creatinine, Ser: 0.44 mg/dL (ref 0.44–1.00)
GFR, Estimated: >60 mL/min (ref >60)
Glucose, Bld: 116 mg/dL — ABNORMAL HIGH (ref 70–99)
Potassium: 3.2 mmol/L — ABNORMAL LOW (ref 3.5–5.1)
Sodium: 142 mmol/L (ref 135–145)
Total Bilirubin: 2.2 mg/dL — ABNORMAL HIGH (ref 0.3–1.2)
Total Protein: 7.8 g/dL (ref 6.5–8.1)

## 2022-06-30 LAB — GLUCOSE, CAPILLARY
Glucose-Capillary: 115 mg/dL — ABNORMAL HIGH (ref 70–99)
Glucose-Capillary: 113 mg/dL — ABNORMAL HIGH (ref 70–99)
Glucose-Capillary: 136 mg/dL — ABNORMAL HIGH (ref 70–99)
Glucose-Capillary: 104 mg/dL — ABNORMAL HIGH (ref 70–99)
Glucose-Capillary: 134 mg/dL — ABNORMAL HIGH (ref 70–99)
Glucose-Capillary: 118 mg/dL — ABNORMAL HIGH (ref 70–99)

## 2022-06-30 LAB — PROTIME-INR
INR: 1.6 — ABNORMAL HIGH (ref 0.8–1.2)
Prothrombin Time: 19.3 seconds — ABNORMAL HIGH (ref 11.4–15.2)

## 2022-06-30 LAB — CBC
HCT: 31.1 % — ABNORMAL LOW (ref 36.0–46.0)
Hemoglobin: 9.3 g/dL — ABNORMAL LOW (ref 12.0–15.0)
MCH: 25.8 pg — ABNORMAL LOW (ref 26.0–34.0)
MCHC: 29.9 g/dL — ABNORMAL LOW (ref 30.0–36.0)
MCV: 86.4 fL (ref 80.0–100.0)
Platelets: 168 10*3/uL (ref 150–400)
RBC: 3.6 MIL/uL — ABNORMAL LOW (ref 3.87–5.11)
RDW: 28.4 % — ABNORMAL HIGH (ref 11.5–15.5)
WBC: 11.7 10*3/uL — ABNORMAL HIGH (ref 4.0–10.5)
nRBC: 0 % (ref 0.0–0.2)

## 2022-06-30 LAB — PHOSPHORUS: Phosphorus: 1.4 mg/dL — ABNORMAL LOW (ref 2.5–4.6)

## 2022-06-30 LAB — MAGNESIUM: Magnesium: 1.8 mg/dL (ref 1.7–2.4)

## 2022-06-30 MED ORDER — PANTOPRAZOLE SODIUM 40 MG PO TBEC
40.0000 mg | DELAYED_RELEASE_TABLET | Freq: Two times a day (BID) | ORAL | Status: DC
  Administered 2022-06-30 – 2022-07-03 (×8): 40 mg via ORAL
  Filled 2022-06-30 (×8): qty 1

## 2022-06-30 MED ORDER — LACTULOSE 10 GM/15ML PO SOLN
30.0000 g | Freq: Two times a day (BID) | ORAL | Status: DC
  Administered 2022-06-30 – 2022-07-03 (×7): 30 g via ORAL
  Filled 2022-06-30 (×7): qty 45

## 2022-06-30 MED ORDER — ENOXAPARIN SODIUM 40 MG/0.4ML IJ SOSY
40.0000 mg | PREFILLED_SYRINGE | INTRAMUSCULAR | Status: DC
  Administered 2022-06-30 – 2022-07-03 (×4): 40 mg via SUBCUTANEOUS
  Filled 2022-06-30 (×4): qty 0.4

## 2022-06-30 MED ORDER — ZINC SULFATE 220 (50 ZN) MG PO CAPS
220.0000 mg | ORAL_CAPSULE | Freq: Every day | ORAL | Status: DC
  Administered 2022-06-30 – 2022-07-03 (×4): 220 mg via ORAL
  Filled 2022-06-30 (×4): qty 1

## 2022-06-30 MED ORDER — POTASSIUM CHLORIDE 20 MEQ PO PACK
20.0000 meq | PACK | Freq: Once | ORAL | Status: AC
  Administered 2022-06-30: 20 meq
  Filled 2022-06-30: qty 1

## 2022-06-30 MED ORDER — RIFAXIMIN 550 MG PO TABS
550.0000 mg | ORAL_TABLET | Freq: Two times a day (BID) | ORAL | Status: DC
  Administered 2022-06-30 – 2022-07-03 (×7): 550 mg via ORAL
  Filled 2022-06-30 (×8): qty 1

## 2022-06-30 MED ORDER — FOLIC ACID 1 MG PO TABS
1.0000 mg | ORAL_TABLET | Freq: Every day | ORAL | Status: DC
  Administered 2022-06-30 – 2022-07-03 (×4): 1 mg via ORAL
  Filled 2022-06-30 (×4): qty 1

## 2022-06-30 MED ORDER — TRAZODONE HCL 50 MG PO TABS
150.0000 mg | ORAL_TABLET | Freq: Every day | ORAL | Status: DC
  Administered 2022-06-30 – 2022-07-02 (×3): 150 mg via ORAL
  Filled 2022-06-30: qty 3
  Filled 2022-06-30 (×2): qty 1

## 2022-06-30 MED ORDER — OLANZAPINE 10 MG PO TABS
10.0000 mg | ORAL_TABLET | Freq: Two times a day (BID) | ORAL | Status: DC
  Administered 2022-06-30 – 2022-07-03 (×7): 10 mg via ORAL
  Filled 2022-06-30: qty 2
  Filled 2022-06-30 (×9): qty 1

## 2022-06-30 MED ORDER — THIAMINE HCL 100 MG/ML IJ SOLN: 100.0000 mg | Freq: Every day | INTRAMUSCULAR | Status: DC

## 2022-06-30 MED ORDER — THIAMINE MONONITRATE 100 MG PO TABS
100.0000 mg | ORAL_TABLET | Freq: Every day | ORAL | Status: DC
  Administered 2022-06-30 – 2022-07-03 (×4): 100 mg via ORAL
  Filled 2022-06-30 (×4): qty 1

## 2022-06-30 MED ORDER — POTASSIUM PHOSPHATES 15 MMOLE/5ML IV SOLN
45.0000 mmol | Freq: Once | INTRAVENOUS | Status: AC
  Administered 2022-06-30: 45 mmol via INTRAVENOUS
  Filled 2022-06-30: qty 15

## 2022-06-30 MED ORDER — MAGNESIUM SULFATE 2 GM/50ML IV SOLN
2.0000 g | Freq: Once | INTRAVENOUS | Status: AC
  Administered 2022-06-30: 2 g via INTRAVENOUS
  Filled 2022-06-30: qty 50

## 2022-06-30 NOTE — Progress Notes (Signed)
[facility] ADULT ICU REPLACEMENT PROTOCOL   The patient does apply for the [facility] Adult ICU Electrolyte Replacment Protocol based on the criteria listed below:   1.Exclusion criteria: TCTS patients, ECMO patients, and Dialysis patients 2. Is GFR >/= 30 ml/min? Yes.    Patient's GFR today is >60 3. Is SCr </= 2? Yes.   Patient's SCr is 0.44 mg/dL 4. Did SCr increase >/= 0.5 in 24 hours? No. 5.Pt's weight >40kg  Yes.   6. Abnormal electrolyte(s):   K 3.2, Mg 1.8, Phos 1.4    7. Electrolytes replaced per protocol 8.  Call MD STAT for K+ </= 2.5, Phos </= 1, or Mag </= 1 Physician:  E. Verdie Shire R Norena Bratton 06/30/2022 6:05 AM;

## 2022-06-30 NOTE — Progress Notes (Signed)
NAME:  Joann [name], MRN:  [id number], DOB:  30-Jan-1981, LOS: 50 ADMISSION DATE:  06/18/2022, CONSULTATION DATE:  06/19/22 REFERRING MD:  EDP, CHIEF COMPLAINT:  acute etoh withdrawal   BRIEF  41 yo female with hx of ETOH cirrhosis brought to ER with weakness, confusion and fatigue after trying to detox at home.  Her ETOH level was 158 and ammonia level 150's in the ER.  PCCM consulted to assess for ICU admission to treat acute toxic/metabolic encephalopathy.  Pertinent  Medical History  ETOH, cirrhosis, hepatic encephalopathy, HTN, Anxiety, Delirium tremens, Pancreatitis from ETOH, Alcoholic ketoacidosis  Significant Hospital Events: Including procedures, antibiotic start and stop dates in addition to other pertinent events   10/09 Admit 10/10 started on precedex, phenylephrine; start on steroids for acute alcohol hepatitis; MSSA bacteremia >> start ABx 10/11 informed pt hospital administration it is okay to share information with pt's husband (pt had red lock in the chart); solumedrol stopped; GI and ID consulted 10/12 transfuse 1 unit PRBC 10/13 Legal team now recommends that pt's husband not be provided medical information 10/16 NG tube out; concern for aspiration; ancef stopped due concern about cephalosporin induced encephalopathy, and started on zosyn for aspiration and MSSA bacteremia; steroids stopped due concern about infection 10/17 EGD >> ulcer in esophagus, small varix 10/18 off precedex, phenylephrine, octreotide and protonix infusions 10/21 d/c NG tube, transfer to floor  Interim History / Subjective:  Had trouble sleeping last night.  Denies chest pain, abdominal pain, nausea, or dyspnea.  Objective   Blood pressure 129/77, pulse (!) 104, temperature 98.6 F (37 C), temperature source Oral, resp. rate 17, height 5' 4"  (1.626 m), weight 75.2 kg, SpO2 96 %.        Intake/Output Summary (Last 24 hours) at 06/30/2022 0738 Last data filed at 06/29/2022 2000 Gross per 24  hour  Intake 802.62 ml  Output 1500 ml  Net -697.38 ml    Filed Weights   06/26/22 0500 06/27/22 0500 06/29/22 0500  Weight: 82 kg 80.6 kg 75.2 kg    General - alert Eyes - pupils reactive ENT - no sinus tenderness, no stridor Cardiac - regular rate/rhythm, no murmur Chest - equal breath sounds b/l, no wheezing or rales Abdomen - soft, non tender, + bowel sounds Extremities - no cyanosis, clubbing, or edema Skin - no rashes Neuro - normal strength, moves extremities, follows commands Psych - normal mood and behavior  Resolved Hospital Problem list   Shock - from sepsis, hypovolemia, sedation, Acute toxic/metabolic encephalopathy 2nd to ETOH withdrawal/hepatic encephalopathy/sepsis  Assessment & Plan:   Hx of Anxiety, ETOH abuse, Insomnia. - continue zyprexa - prn haldol, ativan - restarted trazodone qhs  Sepsis from MSSA bacteremia. Aspiration pneumonitis. - ID consulted - continue ABx through 10/24 - changed back to ancef on 62/86  Alcoholic cirrhosis with acute decompensation. Coffee ground emesis with grade 1 esophageal varices, and esophageal ulcer likely from cortrak. Moderate portal hypertensive gastropathy. Hepatic encephalopathy. - GI consulted - titrate lactulose to 2 - 4 soft stools per day - continue rifaximin, thiamine, folic acid, zinc  Hypertension. - continue coreg  Ileus. Dysphagia. - improved clinically on 10/20 - d/c NG tube 10/21 - advance diet as tolerated - prn zofran for nausea  Hypokalemia, Hypomagnesemia, Hypophosphatemia. - replace as needed - f/u BMET, Mg, Ph  Anemia of critical illness and ABLA from Upper GI bleeding. Thrombocytopenia in setting of sepsis and cirrhosis. - f/u CBC intermittently - transfuse for Hb < 7 - Hb  improving >> will add lovenox for DVT prophylaxis  Deconditioning. - PT/OT assessment  Transfer to floor bed 10/21.  Will ask [facility] to assume care 10/22 and PCCM off.  Best Practice (right click and  "Reselect all SmartList Selections" daily)  Diet/type: clear liquids DVT prophylaxis: SCDs, Lovenox GI prophylaxis: Protonix Lines: Rt arm PICC line 06/20/22  Foley:  N/A  Code Status:  full code  Labs:      Latest Ref Rng & Units 06/30/2022    4:25 AM 06/29/2022    8:33 PM 06/29/2022    5:37 AM  CMP  Glucose 70 - 99 mg/dL 116  149  103   BUN 6 - 20 mg/dL <5  <5  5   Creatinine 0.44 - 1.00 mg/dL 0.44  0.53  0.47   Sodium 135 - 145 mmol/L 142  139  144   Potassium 3.5 - 5.1 mmol/L 3.2  2.9  2.8   Chloride 98 - 111 mmol/L 114  109  114   CO2 22 - 32 mmol/L 21  23  21    Calcium 8.9 - 10.3 mg/dL 8.4  8.3  8.1   Total Protein 6.5 - 8.1 g/dL 7.8   6.8   Total Bilirubin 0.3 - 1.2 mg/dL 2.2   2.2   Alkaline Phos 38 - 126 U/L 99   100   AST 15 - 41 U/L 37   39   ALT 0 - 44 U/L 10   10        Latest Ref Rng & Units 06/30/2022    4:26 AM 06/29/2022    5:37 AM 06/28/2022    3:54 AM  CBC  WBC 4.0 - 10.5 K/uL 11.7  9.4  5.8   Hemoglobin 12.0 - 15.0 g/dL 9.3  8.1  7.6   Hematocrit 36.0 - 46.0 % 31.1  28.1  26.8   Platelets 150 - 400 K/uL 168  94  68     ABG    Component Value Date/Time   PHART 7.448 12/31/2019 0826   PCO2ART 38.2 12/31/2019 0826   PO2ART 346 (H) 12/31/2019 0826   HCO3 26.3 12/31/2019 0826   TCO2 27 12/31/2019 0826   ACIDBASEDEF 2.5 (H) 12/30/2019 1655   O2SAT 100.0 12/31/2019 0826    CBG (last 3)  Recent Labs    06/29/22 1943 06/29/22 2313 06/30/22 0414  GLUCAP 135* 134* 115*    Signature:  Chesley Mires, MD [place] Pager - [phone], or [phone]) [phone] - [phone] 06/30/2022, 7:38 AM

## 2022-06-30 NOTE — Evaluation (Addendum)
Physical Therapy Evaluation Patient Details Name: Joann [name] MRN: [id number] DOB: September 22, 1980 Today's Date: 06/30/2022  History of Present Illness  41 yo female with hx of ETOH cirrhosis brought to ER 06/18/22 with weakness, confusion and fatigue, encephalopathy. PMH: ETOH cirrhosis, pancreatitis, espohogeal varices.,  Clinical Impression  Patient admitted  for above medical problems. Patient has had several admissions for same, appears this time to  be more compromised in MS and  physical status- 11 days.  Patient required mod assistance to sit up and then +2 max to stand and  pivot to recliner, which is first time OOB  for some time. HR 102, SPO2  on RA100%. Patient may require post acute rehab due to profound weakness lives with family, spouse is away  for job. Pt admitted with above diagnosis. Pt currently with functional limitations due to the deficits listed below (see PT Problem List). Pt will benefit from skilled PT to increase their independence and safety with mobility to allow discharge to the venue listed below.          Recommendations for follow up therapy are one component of a multi-disciplinary discharge planning process, led by the attending physician.  Recommendations may be updated based on patient status, additional functional criteria and insurance authorization.  Follow Up Recommendations Skilled nursing-short term rehab (<3 hours/day) (unless progresses well) Can patient physically be transported by private vehicle: No    Assistance Recommended at Discharge Frequent or constant Supervision/Assistance  Patient can return home with the following  A lot of help with walking and/or transfers;A lot of help with bathing/dressing/bathroom;Assistance with cooking/housework;Assist for transportation;Help with stairs or ramp for entrance    Equipment Recommendations None recommended by PT  Recommendations for Other Services       Functional Status Assessment Patient has had  a recent decline in their functional status and demonstrates the ability to make significant improvements in function in a reasonable and predictable amount of time.     Precautions / Restrictions Precautions Precautions: Fall Precaution Comments: flexiseal      Mobility  Bed Mobility Overal bed mobility: Needs Assistance Bed Mobility: Rolling, Sidelying to Sit Rolling: Min assist Sidelying to sit: Mod assist       General bed mobility comments: assist with trunk,  patient placed legs over bed edge    Transfers Overall transfer level: Needs assistance Equipment used: 2 person hand held assist Transfers: Sit to/from Stand, Bed to chair/wheelchair/BSC Sit to Stand: Mod assist   Step pivot transfers: +2 safety/equipment, +2 physical assistance, Mod assist       General transfer comment: Assist to rise from bed, multimodal cues to step to recliner, shuffling and cues to reach back to recliner.    Ambulation/Gait                  Stairs            Wheelchair Mobility    Modified Rankin (Stroke Patients Only)       Balance Overall balance assessment: Needs assistance Sitting-balance support: Feet supported, Bilateral upper extremity supported Sitting balance-Leahy Scale: Fair     Standing balance support: Bilateral upper extremity supported, During functional activity Standing balance-Leahy Scale: Poor Standing balance comment: reliant on heavy support to stand.                             Pertinent Vitals/Pain Pain Assessment Pain Assessment: No/denies pain    Home Living Family/patient  expects to be discharged to:: Private residence Living Arrangements: Spouse/significant other Available Help at Discharge: Family Type of Home: House Home Access: Stairs to enter   Entrance Stairs-Number of Steps: 4 Alternate Level Stairs-Number of Steps: [place]: Two level;Able to live on main level with bedroom/bathroom Home Equipment:  [place] (2 wheels);BSC/3in1 Additional Comments: Above home info from previous encounter, patient  unable to provide other than she lives with spouse son , and daughter.    Prior Function Prior Level of Function : Independent/Modified Independent             Mobility Comments: Working on/off, spouse drives a truck so away for work at times., pt states that she was to start a job today. ADLs Comments: independent     Hand Dominance   Dominant Hand: Right    Extremity/Trunk Assessment   Upper Extremity Assessment Upper Extremity Assessment: Generalized weakness    Lower Extremity Assessment Lower Extremity Assessment: Generalized weakness       Communication   Communication: No difficulties  Cognition Arousal/Alertness: Awake/alert Behavior During Therapy: Flat affect Overall Cognitive Status: Impaired/Different from baseline Area of Impairment: Awareness, Safety/judgement                 Orientation Level: Time     Following Commands: Follows one step commands with increased time Safety/Judgement: Decreased awareness of deficits Awareness: Emergent   General Comments: slow to respond, starey eyed at times,        General Comments      Exercises     Assessment/Plan    PT Assessment Patient needs continued PT services  PT Problem List Decreased strength;Decreased mobility;Decreased safety awareness;Decreased knowledge of precautions;Decreased activity tolerance;Decreased cognition;Decreased balance;Decreased knowledge of use of DME       PT Treatment Interventions DME instruction;Therapeutic activities;Cognitive remediation;Gait training;Therapeutic exercise;Patient/family education;Balance training;Functional mobility training    PT Goals (Current goals can be found in the Care Plan section)  Acute Rehab PT Goals Patient Stated Goal: wants to  go home today PT Goal Formulation: With patient Time For Goal Achievement: 07/14/22 Potential to  Achieve Goals: Fair    Frequency Min 3X/week     Co-evaluation               AM-PAC PT "6 Clicks" Mobility  Outcome Measure Help needed turning from your back to your side while in a flat bed without using bedrails?: A Lot Help needed moving from lying on your back to sitting on the side of a flat bed without using bedrails?: A Lot Help needed moving to and from a bed to a chair (including a wheelchair)?: A Lot Help needed standing up from a chair using your arms (e.g., wheelchair or bedside chair)?: A Lot Help needed to walk in hospital room?: Total Help needed climbing 3-5 steps with a railing? : Total 6 Click Score: 10    End of Session   Activity Tolerance: Patient limited by fatigue Patient left: in chair;with call bell/phone within reach;with chair alarm set Nurse Communication: Mobility status PT Visit Diagnosis: Unsteadiness on feet (R26.81);Muscle weakness (generalized) (M62.81);Other symptoms and signs involving the nervous system (R29.898)    Time: [phone] mins   Charges:   PT Evaluation $PT Eval Low Complexity: 1 Low PT Treatments $Therapeutic Activity: 8-22 mins        [place] Office [phone] Weekend pager-[phone]   Claretha Cooper 06/30/2022, 12:03 PM

## 2022-07-01 ENCOUNTER — Encounter (HOSPITAL_COMMUNITY): Payer: Self-pay | Admitting: Gastroenterology

## 2022-07-01 DIAGNOSIS — K922 Gastrointestinal hemorrhage, unspecified: Secondary | ICD-10-CM | POA: Diagnosis not present

## 2022-07-01 DIAGNOSIS — A4101 Sepsis due to Methicillin susceptible Staphylococcus aureus: Secondary | ICD-10-CM | POA: Diagnosis present

## 2022-07-01 DIAGNOSIS — F10931 Alcohol use, unspecified with withdrawal delirium: Secondary | ICD-10-CM | POA: Diagnosis not present

## 2022-07-01 DIAGNOSIS — E872 Acidosis, unspecified: Secondary | ICD-10-CM | POA: Diagnosis present

## 2022-07-01 DIAGNOSIS — K567 Ileus, unspecified: Secondary | ICD-10-CM | POA: Diagnosis present

## 2022-07-01 DIAGNOSIS — K2210 Ulcer of esophagus without bleeding: Secondary | ICD-10-CM

## 2022-07-01 DIAGNOSIS — N179 Acute kidney failure, unspecified: Secondary | ICD-10-CM | POA: Diagnosis not present

## 2022-07-01 DIAGNOSIS — K7682 Hepatic encephalopathy: Secondary | ICD-10-CM | POA: Diagnosis not present

## 2022-07-01 DIAGNOSIS — K766 Portal hypertension: Secondary | ICD-10-CM

## 2022-07-01 DIAGNOSIS — R6521 Severe sepsis with septic shock: Secondary | ICD-10-CM

## 2022-07-01 DIAGNOSIS — K3189 Other diseases of stomach and duodenum: Secondary | ICD-10-CM

## 2022-07-01 DIAGNOSIS — K7030 Alcoholic cirrhosis of liver without ascites: Secondary | ICD-10-CM

## 2022-07-01 DIAGNOSIS — I10 Essential (primary) hypertension: Secondary | ICD-10-CM | POA: Diagnosis present

## 2022-07-01 LAB — GLUCOSE, CAPILLARY
Glucose-Capillary: 114 mg/dL — ABNORMAL HIGH (ref 70–99)
Glucose-Capillary: 97 mg/dL (ref 70–99)
Glucose-Capillary: 104 mg/dL — ABNORMAL HIGH (ref 70–99)
Glucose-Capillary: 118 mg/dL — ABNORMAL HIGH (ref 70–99)
Glucose-Capillary: 105 mg/dL — ABNORMAL HIGH (ref 70–99)
Glucose-Capillary: 118 mg/dL — ABNORMAL HIGH (ref 70–99)

## 2022-07-01 LAB — BASIC METABOLIC PANEL
Anion gap: 7 (ref 5–15)
BUN: <5 mg/dL — ABNORMAL LOW (ref 6–20)
CO2: 18 mmol/L — ABNORMAL LOW (ref 22–32)
Calcium: 7.8 mg/dL — ABNORMAL LOW (ref 8.9–10.3)
Chloride: 110 mmol/L (ref 98–111)
Creatinine, Ser: 0.4 mg/dL — ABNORMAL LOW (ref 0.44–1.00)
GFR, Estimated: >60 mL/min (ref >60)
Glucose, Bld: 114 mg/dL — ABNORMAL HIGH (ref 70–99)
Potassium: 3.2 mmol/L — ABNORMAL LOW (ref 3.5–5.1)
Sodium: 135 mmol/L (ref 135–145)

## 2022-07-01 LAB — MAGNESIUM: Magnesium: 1.6 mg/dL — ABNORMAL LOW (ref 1.7–2.4)

## 2022-07-01 LAB — C-REACTIVE PROTEIN: CRP: 3.1 mg/dL — ABNORMAL HIGH (ref <1.0)

## 2022-07-01 LAB — PHOSPHORUS: Phosphorus: 2.7 mg/dL (ref 2.5–4.6)

## 2022-07-01 LAB — PROCALCITONIN: Procalcitonin: <0.1 ng/mL

## 2022-07-01 MED ORDER — POTASSIUM CHLORIDE CRYS ER 20 MEQ PO TBCR
40.0000 meq | EXTENDED_RELEASE_TABLET | Freq: Once | ORAL | Status: AC
  Administered 2022-07-01: 40 meq via ORAL
  Filled 2022-07-01: qty 2

## 2022-07-01 MED ORDER — SUCRALFATE 1 G PO TABS
1.0000 g | ORAL_TABLET | Freq: Three times a day (TID) | ORAL | Status: DC
  Administered 2022-07-01 – 2022-07-03 (×8): 1 g via ORAL
  Filled 2022-07-01 (×8): qty 1

## 2022-07-01 MED ORDER — MAGNESIUM SULFATE 2 GM/50ML IV SOLN
2.0000 g | Freq: Once | INTRAVENOUS | Status: AC
  Administered 2022-07-01: 2 g via INTRAVENOUS
  Filled 2022-07-01: qty 50

## 2022-07-01 NOTE — Assessment & Plan Note (Signed)
   Hospitalization complicated by development of MSSA bacteremia and suspected concurrent aspiration pneumonitis  Patient was treated with Zosyn, followed by Unasyn and has now settled on cefazolin with infectious disease providing guidance to give the patient a total of a 2-week course of therapy (stop date 10/26).  Patient was periodically hemodynamically unstable requiring phenylephrine infusion which was eventually weaned off.  Patient currently afebrile, normotensive and without any evidence of hypoxia

## 2022-07-01 NOTE — Assessment & Plan Note (Signed)
   Resolved with intravenous volume resuscitation

## 2022-07-01 NOTE — Assessment & Plan Note (Signed)
·   Please see assessment and plan above °

## 2022-07-01 NOTE — Assessment & Plan Note (Signed)
   Patient presented to [facility] emergency department on 10/10 with alcohol withdrawal, quickly deteriorating into delirium tremens  Patient was initially hospitalized in the intensive care unit and placed on Precedex  Presentation was additionally complicated by concurrent hepatic encephalopathy with ammonia levels initially over 150  Patient has since been weaned off of Precedex and has dramatically improved  Patient is additionally on lactulose and rifaximin.    Lactulose is being titrated to achieve 3 loose stools daily (currently too loose).

## 2022-07-01 NOTE — Assessment & Plan Note (Signed)
   Hospitalization also transiently complicated by ileus which has since resolved.

## 2022-07-01 NOTE — Assessment & Plan Note (Signed)
   Complicated history of cirrhosis with identified esophageal varices, portal hypertensive gastropathy and hepatic encephalopathy  Remainder of assessment and plan as above  We will need close GI follow-up at time of discharge

## 2022-07-01 NOTE — Progress Notes (Signed)
Please allow listed family (spouse, daughter, mother) to call the floor and speak to the nurse concerning the patient's condition. Per spouse, pt was confused on admission and should not be "Confidential patient".

## 2022-07-01 NOTE — Progress Notes (Signed)
OT  Note  Patient Details Name: MICKY OVERTURF MRN: [id number] DOB: 1981-01-24  OT order received.  Spoke with pt whose daughter was present. Pt declined OT at this time but is willing later this day or next morning.  Will check back as able. Kari Baars, OT Acute Rehabilitation Services  [phone], Edwena Felty D 07/01/2022, 3:40 PM

## 2022-07-01 NOTE — Assessment & Plan Note (Signed)
   Has recently been started on Coreg by the PCCM team  We will continue for now

## 2022-07-01 NOTE — Assessment & Plan Note (Signed)
   Resolved with intravenous volume resuscitation and treatment of underlying infection

## 2022-07-01 NOTE — Assessment & Plan Note (Addendum)
   Hospital course additionally complicated by bouts of coffee-ground emesis  Status post EGD on 10/17 revealing grade 1 esophageal varices and a cratered esophageal ulcer likely from initial core track placement  Patient additionally found to have portal hypertensive gastropathy, another potential source of some bleeding  Patient has been transition from intravenous proton pump inhibitor therapy to Protonix twice daily by mouth  I am additionally adding Carafate 3 times daily with meals per previous GI recommendations  No clinical evidence of bleeding currently

## 2022-07-01 NOTE — Progress Notes (Signed)
PROGRESS NOTE   Joann [name]  MVE:[id number] DOB: 21-Apr-1981 DOA: 06/18/2022 PCP: Pcp, No   Date of Service: the patient was seen and examined on 07/01/2022  Brief Narrative:  41 year old female with past medical history of alcoholic cirrhosis (presumed alcohol related) with complications of portal hypertension, esophageal varices and hepatic encephalopathy.  Patient originally presented to the emergency department 10/10 with weakness confusion and fatigue after trying to detox at home.  On initial evaluation EtOH level was 158 with ammonia level in the 150s.  Patient was felt to be suffering from severe alcohol withdrawal/delirium tremens and due to need for Precedex, PCCM was contacted with the patient being initially admitted to the ICU under their service.   Hospital course was complicated by anemia with associated coffee-ground emesis requiring 1 unit PRBC transfusion on 10/12.  Dr. Bryan Lemma with gastroenterology was consulted and patient eventually underwent EGD on 10/17 revealing an ulcer in the esophagus and small varix.    Hospital course was also complicated by MSSA bacteremia with additional concerns for aspiration pneumonia.  Patient was briefly on intravenous Zosyn, then Unasyn and eventually transitioned to cefazolin with the direction of Dr. Linus Salmons with infectious disease to be given for a total of 2 weeks.  Precedex was discontinued on 10/18.  NG tube was eventually discontinued on 10/21.  Patient was transferred to the hospital service on 10/22 as well as being transferred to the medical floor.   Assessment and Plan: Alcohol withdrawal syndrome, with delirium [facility]) Patient presented to [facility] emergency department on 10/10 with alcohol withdrawal, quickly deteriorating into delirium tremens Patient was initially hospitalized in the intensive care unit and placed on Precedex Presentation was additionally complicated by concurrent hepatic encephalopathy with ammonia  levels initially over 150 Patient has since been weaned off of Precedex and has dramatically improved Patient is additionally on lactulose and rifaximin.   Lactulose is being titrated to achieve 3 loose stools daily (currently too loose).  Sepsis due to methicillin susceptible Staphylococcus aureus (MSSA) with acute organ dysfunction and septic shock (HCC) Hospitalization complicated by development of MSSA bacteremia and suspected concurrent aspiration pneumonitis Patient was treated with Zosyn, followed by Unasyn and has now settled on cefazolin with infectious disease providing guidance to give the patient a total of a 2-week course of therapy (stop date 10/26). Patient was periodically hemodynamically unstable requiring phenylephrine infusion which was eventually weaned off. Patient currently afebrile, normotensive and without any evidence of hypoxia  Acute upper gastrointestinal bleeding Hospital course additionally complicated by bouts of coffee-ground emesis Status post EGD on 10/17 revealing grade 1 esophageal varices and a cratered esophageal ulcer likely from initial core track placement Patient additionally found to have portal hypertensive gastropathy, another potential source of some bleeding Patient has been transition from intravenous proton pump inhibitor therapy to Protonix twice daily by mouth I am additionally adding Carafate 3 times daily with meals per previous GI recommendations No clinical evidence of bleeding currently  Ileus [facility]) Hospitalization also transiently complicated by ileus which has since resolved.  Alcoholic cirrhosis of liver without ascites (HCC) Complicated history of cirrhosis with identified esophageal varices, portal hypertensive gastropathy and hepatic encephalopathy Remainder of assessment and plan as above We will need close GI follow-up at time of discharge  AKI (acute kidney injury) ([place]) Resolved with intravenous volume  resuscitation  Acute hepatic encephalopathy ([place]) Please see assessment and plan above  Lactic acidosis Resolved with intravenous volume resuscitation and treatment of underlying infection  Essential hypertension Has recently  been started on Coreg by the PCCM team We will continue for now  Portal hypertensive gastropathy ([place]) Please see assessment and plan above  Ulcer of esophagus without bleeding Please see assessment and plan above    Subjective:  Patient complaining of feeling hungry.  Patient denies any abdominal pain.  Patient does complain of some mild generalized weakness.  Patient denies any episodes of melena.  Physical Exam:  Vitals:   07/01/22 1000 07/01/22 1100 07/01/22 1200 07/01/22 1331  BP:   108/71   Pulse: 91 91 85   Resp: 16 15 15    Temp:    98 F (36.7 C)  TempSrc:    Oral  SpO2: 100% 100% 100%   Weight:      Height:        Constitutional: Lethargic but arousable and oriented x3, no associated distress.   Skin: no rashes, no lesions, good skin turgor noted. Eyes: Pupils are equally reactive to light.  No evidence of scleral icterus or conjunctival pallor.  ENMT: Moist mucous membranes noted.  Posterior pharynx clear of any exudate or lesions.   Respiratory: clear to auscultation bilaterally, no wheezing, no crackles. Normal respiratory effort. No accessory muscle use.  Cardiovascular: Slightly tachycardic rate with regular rhythm, no murmurs / rubs / gallops. No extremity edema. 2+ pedal pulses. No carotid bruits.  Abdomen: Abdomen is somewhat protuberant but soft and nontender.  No evidence of intra-abdominal masses.  Positive bowel sounds noted in all quadrants.   Musculoskeletal: No joint deformity upper and lower extremities. Good ROM, no contractures. Normal muscle tone.    Data Reviewed:  I have personally reviewed and interpreted labs, imaging.  Significant findings are   CBC: Recent Labs  Lab 06/26/22 0225 06/26/22 0853  06/26/22 1958 06/27/22 0357 06/28/22 0354 06/29/22 0537 06/30/22 0426  WBC 6.1  --   --  4.4 5.8 9.4 11.7*  HGB 7.0*   < > 6.6* 7.8* 7.6* 8.1* 9.3*  HCT 24.9*   < > 24.1* 27.0* 26.8* 28.1* 31.1*  MCV 86.2  --   --  88.2 89.6 87.8 86.4  PLT 73*  --   --  61* 68* 94* 168   < > = values in this interval not displayed.   Basic Metabolic Panel: Recent Labs  Lab 06/25/22 0544 06/26/22 0225 06/26/22 1958 06/27/22 0357 06/28/22 0354 06/28/22 1447 06/29/22 0537 06/29/22 2033 06/30/22 0425 07/01/22 0611  NA 136   < > 144 145   < > 144 144 139 142 135  K 3.8   < > 3.5 3.9   < > 3.8 2.8* 2.9* 3.2* 3.2*  CL 113*   < > 119* 118*   < > 118* 114* 109 114* 110  CO2 17*   < > 20* 22   < > 23 21* 23 21* 18*  GLUCOSE 119*   < > 119* 116*   < > 121* 103* 149* 116* 114*  BUN 15   < > 7 6   < > 7 5* <5* <5* <5*  CREATININE 0.77   < > 0.32* 0.35*   < > 0.46 0.47 0.53 0.44 0.40*  CALCIUM 9.3   < > 7.7* 7.9*   < > 7.8* 8.1* 8.3* 8.4* 7.8*  MG 1.9   < > 1.5* 2.3  --   --  1.5*  --  1.8 1.6*  PHOS 3.2  --   --  2.5  --   --   --   --  1.4* 2.7   < > = values in this interval not displayed.   GFR: Estimated Creatinine Clearance: 89.9 mL/min (A) (by C-G formula based on SCr of 0.4 mg/dL (L)). Liver Function Tests: Recent Labs  Lab 06/26/22 0225 06/27/22 0357 06/28/22 0354 06/29/22 0537 06/30/22 0425  AST 47* 45* 41 39 37  ALT 13 12 12 10 10   ALKPHOS 77 74 76 100 99  BILITOT 2.0* 1.9* 2.2* 2.2* 2.2*  PROT 5.6* 6.4* 6.0* 6.8 7.8  ALBUMIN 2.3* 3.1* 2.8* 3.0* 3.5    Coagulation Profile: Recent Labs  Lab 06/26/22 1224 06/27/22 0357 06/28/22 1447 06/29/22 0537 06/30/22 1132  INR 1.6* 1.7* 1.7* 1.7* 1.6*     Code Status:  Full code   Severity of Illness:  The appropriate patient status for this patient is INPATIENT. Inpatient status is judged to be reasonable and necessary in order to provide the required intensity of service to ensure the patient's safety. The patient's presenting  symptoms, physical exam findings, and initial radiographic and laboratory data in the context of their chronic comorbidities is felt to place them at high risk for further clinical deterioration. Furthermore, it is not anticipated that the patient will be medically stable for discharge from the hospital within 2 midnights of admission.   * I certify that at the point of admission it is my clinical judgment that the patient will require inpatient hospital care spanning beyond 2 midnights from the point of admission due to high intensity of service, high risk for further deterioration and high frequency of surveillance required.*  Time spent:  50 minutes  Author:  Vernelle Emerald MD  07/01/2022 4:37 PM

## 2022-07-01 NOTE — Hospital Course (Addendum)
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  Patient originally presented to the emergency department 10/10 with weakness confusion and fatigue after trying to detox at home.  On initial evaluation EtOH level was 158 with ammonia level in the 150s.  Patient was felt to be suffering from severe alcohol withdrawal/delirium tremens and due to need for Precedex, PCCM was contacted with the patient being initially admitted to the ICU under their service.   Hospital course was complicated by anemia with associated coffee-ground emesis requiring 1 unit PRBC transfusion on 10/12.  Dr. Bryan Lemma with gastroenterology was consulted and patient eventually underwent EGD on 10/17 revealing an ulcer in the esophagus and small varix.  Patient additionally found to have portal hypertensive gastropathy, another potential source of some bleeding  Hospital course was also complicated by MSSA bacteremia with additional concerns for aspiration pneumonia.  Patient was briefly on intravenous Zosyn, then Unasyn and eventually transitioned to cefazolin with the direction of Dr. Linus Salmons with infectious disease to be given for a total of 2 weeks with stop date on 10/26.  Hospital course was additionally complicated by ileus which resolved with NG tube.  Precedex was discontinued on 10/18.  NG tube was eventually discontinued on 10/21.  Patient was transition from intravenous Protonix to oral therapy.    Patient was transferred to the hospital service on 10/22 as well as being transferred to the medical floor.  Patient continues to clinically improve over the next several days with no further evidence of bleeding.  Patient was transitioned over to oral antibiotics with Keflex to complete her antibiotic course at time of discharge.  Patient also received an iron infusion on 10/24 prior to  discharge.  Patient is being discharged in improved and stable condition with outpatient follow-up with lobe or gastroenterology as well as following up with the internal medicine center to establish a new primary care provider.

## 2022-07-02 DIAGNOSIS — K7682 Hepatic encephalopathy: Secondary | ICD-10-CM | POA: Diagnosis not present

## 2022-07-02 DIAGNOSIS — F10931 Alcohol use, unspecified with withdrawal delirium: Secondary | ICD-10-CM | POA: Diagnosis not present

## 2022-07-02 DIAGNOSIS — N179 Acute kidney failure, unspecified: Secondary | ICD-10-CM | POA: Diagnosis not present

## 2022-07-02 DIAGNOSIS — K922 Gastrointestinal hemorrhage, unspecified: Secondary | ICD-10-CM | POA: Diagnosis not present

## 2022-07-02 LAB — CBC WITH DIFFERENTIAL/PLATELET
Abs Immature Granulocytes: 0.05 10*3/uL (ref 0.00–0.07)
Basophils Absolute: 0 10*3/uL (ref 0.0–0.1)
Basophils Relative: 0 %
Eosinophils Absolute: 0.1 10*3/uL (ref 0.0–0.5)
Eosinophils Relative: 1 %
HCT: 27.6 % — ABNORMAL LOW (ref 36.0–46.0)
Hemoglobin: 8 g/dL — ABNORMAL LOW (ref 12.0–15.0)
Immature Granulocytes: 1 %
Lymphocytes Relative: 25 %
Lymphs Abs: 1.9 10*3/uL (ref 0.7–4.0)
MCH: 25.6 pg — ABNORMAL LOW (ref 26.0–34.0)
MCHC: 29 g/dL — ABNORMAL LOW (ref 30.0–36.0)
MCV: 88.2 fL (ref 80.0–100.0)
Monocytes Absolute: 0.7 10*3/uL (ref 0.1–1.0)
Monocytes Relative: 9 %
Neutro Abs: 4.9 10*3/uL (ref 1.7–7.7)
Neutrophils Relative %: 64 %
Platelets: 115 10*3/uL — ABNORMAL LOW (ref 150–400)
RBC: 3.13 MIL/uL — ABNORMAL LOW (ref 3.87–5.11)
RDW: 27.7 % — ABNORMAL HIGH (ref 11.5–15.5)
WBC: 7.5 10*3/uL (ref 4.0–10.5)
nRBC: 0 % (ref 0.0–0.2)

## 2022-07-02 LAB — GLUCOSE, CAPILLARY
Glucose-Capillary: 100 mg/dL — ABNORMAL HIGH (ref 70–99)
Glucose-Capillary: 102 mg/dL — ABNORMAL HIGH (ref 70–99)
Glucose-Capillary: 104 mg/dL — ABNORMAL HIGH (ref 70–99)
Glucose-Capillary: 106 mg/dL — ABNORMAL HIGH (ref 70–99)
Glucose-Capillary: 106 mg/dL — ABNORMAL HIGH (ref 70–99)
Glucose-Capillary: 107 mg/dL — ABNORMAL HIGH (ref 70–99)

## 2022-07-02 LAB — COMPREHENSIVE METABOLIC PANEL
ALT: 9 U/L (ref 0–44)
AST: 37 U/L (ref 15–41)
Albumin: 3.1 g/dL — ABNORMAL LOW (ref 3.5–5.0)
Alkaline Phosphatase: 97 U/L (ref 38–126)
Anion gap: 7 (ref 5–15)
BUN: <5 mg/dL — ABNORMAL LOW (ref 6–20)
CO2: 20 mmol/L — ABNORMAL LOW (ref 22–32)
Calcium: 8 mg/dL — ABNORMAL LOW (ref 8.9–10.3)
Chloride: 108 mmol/L (ref 98–111)
Creatinine, Ser: 0.54 mg/dL (ref 0.44–1.00)
GFR, Estimated: >60 mL/min (ref >60)
Glucose, Bld: 110 mg/dL — ABNORMAL HIGH (ref 70–99)
Potassium: 3.4 mmol/L — ABNORMAL LOW (ref 3.5–5.1)
Sodium: 135 mmol/L (ref 135–145)
Total Bilirubin: 1.6 mg/dL — ABNORMAL HIGH (ref 0.3–1.2)
Total Protein: 7 g/dL (ref 6.5–8.1)

## 2022-07-02 LAB — MAGNESIUM: Magnesium: 1.7 mg/dL (ref 1.7–2.4)

## 2022-07-02 LAB — PHOSPHORUS: Phosphorus: 2.5 mg/dL (ref 2.5–4.6)

## 2022-07-02 MED ORDER — POTASSIUM CHLORIDE CRYS ER 20 MEQ PO TBCR
40.0000 meq | EXTENDED_RELEASE_TABLET | Freq: Once | ORAL | Status: AC
  Administered 2022-07-02: 40 meq via ORAL
  Filled 2022-07-02: qty 2

## 2022-07-02 NOTE — Progress Notes (Signed)
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  Patient originally presented to the emergency department 10/10 with weakness confusion and fatigue after trying to detox at home.  On initial evaluation EtOH level was 158 with ammonia level in the 150s.  Patient was felt to be suffering from severe alcohol withdrawal/delirium tremens and due to need for Precedex, PCCM was contacted with the patient being initially admitted to the ICU under their service.   Hospital course was complicated by anemia with associated coffee-ground emesis requiring 1 unit PRBC transfusion on 10/12.  Dr. Bryan Lemma with gastroenterology was consulted and patient eventually underwent EGD on 10/17 revealing an ulcer in the esophagus and small varix.  Patient additionally found to have portal hypertensive gastropathy, another potential source of some bleeding  Hospital course was also complicated by MSSA bacteremia with additional concerns for aspiration pneumonia.  Patient was briefly on intravenous Zosyn, then Unasyn and eventually transitioned to cefazolin with the direction of Dr. Linus Salmons with infectious disease to be given for a total of 2 weeks with stop date on 10/26.  Hospital course was additionally complicated by ileus which resolved with NG tube.  Precedex was discontinued on 10/18.  NG tube was eventually discontinued on 10/21.  Patient was transition from intravenous Protonix to oral therapy.    Patient was transferred to the hospital service on 10/22 as well as being transferred to the medical floor.   Assessment and Plan: Alcohol withdrawal syndrome, with delirium ([place]) Patient has since been weaned  off of Precedex and has dramatically improved Patient is additionally on lactulose and rifaximin.   Lactulose is being titrated to achieve 3 loose stools daily (currently too loose).  Sepsis due to methicillin susceptible Staphylococcus aureus (MSSA) with acute organ dysfunction and septic shock (HCC) Hospitalization complicated by development of MSSA bacteremia and suspected concurrent aspiration pneumonitis Patient was treated with Zosyn, followed by Unasyn and has now settled on cefazolin with infectious disease providing guidance to give the patient a total of a 2-week course of therapy (stop date 10/26). Patient currently afebrile, normotensive and without any evidence of hypoxia  Acute upper gastrointestinal bleeding Continuing Protonix by mouth twice daily. Continuing Carafate 3 times daily with meals per previous GI recommendations No clinical evidence of bleeding currently  Ileus [facility]) Hospitalization also transiently complicated by ileus which has since resolved.  Alcoholic cirrhosis of liver without ascites (HCC) Complicated history of cirrhosis with identified esophageal varices, portal hypertensive gastropathy and hepatic encephalopathy Remainder of assessment and plan as above We will need close GI follow-up at time of discharge  AKI (acute kidney injury) ([place]) Resolved with intravenous volume resuscitation  Acute hepatic encephalopathy ([place]) Please see assessment and plan above  Lactic acidosis Resolved with intravenous volume resuscitation and treatment of underlying infection  Essential hypertension Has recently been started on Coreg by the PCCM team We will continue for now  Portal hypertensive gastropathy ([place]) Please see assessment and plan above  Ulcer of esophagus without bleeding Please see assessment and plan above       Subjective:  Patient complains of mild generalized weakness.  Patient denies any abdominal pain nausea vomiting or blood in  her stool.  Physical Exam:  Vitals:   07/02/22  0500 07/02/22 0517 07/02/22 0722 07/02/22 1335  BP:  91/67 108/63 112/74  Pulse:  100 (!) 102 100  Resp:  15 16 17   Temp:  99.1 F (37.3 C) 98.8 F (37.1 C) (!) 97.4 F (36.3 C)  TempSrc:  Oral Oral Oral  SpO2:  99% 100% 100%  Weight: 75.1 kg     Height:        Constitutional: Awake alert and oriented x3, no associated distress.   Skin: no rashes, no lesions, good skin turgor noted. Eyes: Pupils are equally reactive to light.  No evidence of scleral icterus or conjunctival pallor.  ENMT: Moist mucous membranes noted.  Posterior pharynx clear of any exudate or lesions.   Respiratory: clear to auscultation bilaterally, no wheezing, no crackles. Normal respiratory effort. No accessory muscle use.  Cardiovascular: Regular rate and rhythm, no murmurs / rubs / gallops. No extremity edema. 2+ pedal pulses. No carotid bruits.  Abdomen: Abdomen is soft and nontender.  No evidence of intra-abdominal masses.  Positive bowel sounds noted in all quadrants.   Musculoskeletal: No joint deformity upper and lower extremities. Good ROM, no contractures. Normal muscle tone.    Data Reviewed:  I have personally reviewed and interpreted labs, imaging.  Significant findings are   CBC: Recent Labs  Lab 06/27/22 0357 06/28/22 0354 06/29/22 0537 06/30/22 0426 07/02/22 0200  WBC 4.4 5.8 9.4 11.7* 7.5  NEUTROABS  --   --   --   --  4.9  HGB 7.8* 7.6* 8.1* 9.3* 8.0*  HCT 27.0* 26.8* 28.1* 31.1* 27.6*  MCV 88.2 89.6 87.8 86.4 88.2  PLT 61* 68* 94* 168 263*   Basic Metabolic Panel: Recent Labs  Lab 06/27/22 0357 06/28/22 0354 06/29/22 0537 06/29/22 2033 06/30/22 0425 07/01/22 0611 07/02/22 0200  NA 145   < > 144 139 142 135 135  K 3.9   < > 2.8* 2.9* 3.2* 3.2* 3.4*  CL 118*   < > 114* 109 114* 110 108  CO2 22   < > 21* 23 21* 18* 20*  GLUCOSE 116*   < > 103* 149* 116* 114* 110*  BUN 6   < > 5* <5* <5* <5* <5*  CREATININE 0.35*   < >  0.47 0.53 0.44 0.40* 0.54  CALCIUM 7.9*   < > 8.1* 8.3* 8.4* 7.8* 8.0*  MG 2.3  --  1.5*  --  1.8 1.6* 1.7  PHOS 2.5  --   --   --  1.4* 2.7 2.5   < > = values in this interval not displayed.   GFR: Estimated Creatinine Clearance: 90 mL/min (by C-G formula based on SCr of 0.54 mg/dL). Liver Function Tests: Recent Labs  Lab 06/27/22 0357 06/28/22 0354 06/29/22 0537 06/30/22 0425 07/02/22 0200  AST 45* 41 39 37 37  ALT 12 12 10 10 9   ALKPHOS 74 76 100 99 97  BILITOT 1.9* 2.2* 2.2* 2.2* 1.6*  PROT 6.4* 6.0* 6.8 7.8 7.0  ALBUMIN 3.1* 2.8* 3.0* 3.5 3.1*    Coagulation Profile: Recent Labs  Lab 06/26/22 1224 06/27/22 0357 06/28/22 1447 06/29/22 0537 06/30/22 1132  INR 1.6* 1.7* 1.7* 1.7* 1.6*      Code Status:  Full code.   Family Communication: Daughter is at bedside who has been updated on plan of care.   Severity of Illness:  The appropriate patient status for this patient is INPATIENT. Inpatient status is judged to be reasonable and necessary in order to provide the required  intensity of service to ensure the patient's safety. The patient's presenting symptoms, physical exam findings, and initial radiographic and laboratory data in the context of their chronic comorbidities is felt to place them at high risk for further clinical deterioration. Furthermore, it is not anticipated that the patient will be medically stable for discharge from the hospital within 2 midnights of admission.   * I certify that at the point of admission it is my clinical judgment that the patient will require inpatient hospital care spanning beyond 2 midnights from the point of admission due to high intensity of service, high risk for further deterioration and high frequency of surveillance required.*  Time spent:  35 minutes  Author:  Vernelle Emerald MD  07/02/2022 9:03 PM

## 2022-07-02 NOTE — Progress Notes (Signed)
Physical Therapy Treatment Patient Details Name: Joann [name] MRN: [id number] DOB: 1981-05-28 Today's Date: 07/02/2022   History of Present Illness Patient is a 41 year old female who presented to the hospital on 10/10 with fatigue, confusion and weakness trying to detox at home. patient was found to be in septic shock, alcohol withdrawl with delirium, acute upper GI bleed, ileus, and AKI.  10/12 patient was transfused 1 unit PRBCs. patient underwent EGD 0n 10/17 with discovery of ulcer in esophagus and small varix. NG tube was removed on 10/21 and patient was transitioned to medical floor.   PMH: alcohol cirrhosis, HTN, esophageal varices, hepatic encephalopathy.    PT Comments    Patient demonstrates significant improvement, ambulating   with no device/supervison, x 400'. No F/U PT indicated at this time. Patient's daughter present earlier and will be supportive.     Recommendations for follow up therapy are one component of a multi-disciplinary discharge planning process, led by the attending physician.  Recommendations may be updated based on patient status, additional functional criteria and insurance authorization.  Follow Up Recommendations  No PT follow up Can patient physically be transported by private vehicle: Yes   Assistance Recommended at Discharge Set up Supervision/Assistance  Patient can return home with the following Assistance with cooking/housework;Assist for transportation;Help with stairs or ramp for entrance   Equipment Recommendations  None recommended by PT    Recommendations for Other Services       Precautions / Restrictions Precautions Precautions: Fall     Mobility  Bed Mobility Overal bed mobility: Independent                  Transfers Overall transfer level: Independent                      Ambulation/Gait Ambulation/Gait assistance: Supervision Gait Distance (Feet): 400 Feet   Gait Pattern/deviations: Drifts right/left        General Gait Details: mild drifts, able to turn head and look back , no balance loss   Stairs             Wheelchair Mobility    Modified Rankin (Stroke Patients Only)       Balance Overall balance assessment: Mild deficits observed, not formally tested                                          Cognition Arousal/Alertness: Awake/alert Behavior During Therapy: WFL for tasks assessed/performed Overall Cognitive Status: Within Functional Limits for tasks assessed                                 General Comments: patient was appropriate during session.        Exercises      General Comments        Pertinent Vitals/Pain Pain Assessment Pain Assessment: No/denies pain    Home Living                          Prior Function            PT Goals (current goals can now be found in the care plan section) Progress towards PT goals: Progressing toward goals    Frequency    Min 3X/week      PT Plan Current  plan remains appropriate;Discharge plan needs to be updated    Co-evaluation              AM-PAC PT "6 Clicks" Mobility   Outcome Measure  Help needed turning from your back to your side while in a flat bed without using bedrails?: None Help needed moving from lying on your back to sitting on the side of a flat bed without using bedrails?: None Help needed moving to and from a bed to a chair (including a wheelchair)?: None Help needed standing up from a chair using your arms (e.g., wheelchair or bedside chair)?: None Help needed to walk in hospital room?: A Little Help needed climbing 3-5 steps with a railing? : A Lot 6 Click Score: 21    End of Session   Activity Tolerance: Patient tolerated treatment well Patient left: in bed;with call bell/phone within reach Nurse Communication: Mobility status PT Visit Diagnosis: Unsteadiness on feet (R26.81)     Time: 2355-7322 PT Time Calculation (min)  (ACUTE ONLY): 16 min  Charges:                        [place] Office [phone] Weekend [phone]    Claretha Cooper 07/02/2022, 4:59 PM

## 2022-07-02 NOTE — TOC Progression Note (Signed)
Transition of Care [facility]) - Progression Note    Patient Details  Name: LANORE RENDEROS MRN: [id number] Date of Birth: 08/13/1981  Transition of Care [facility]) CM/SW Contact  Ross Ludwig, [place] Phone Number: 07/02/2022, 6:10 PM  Clinical Narrative:     TOC following patient, original recommendation was SNF.  PT and OT worked with her today, now recommending no follow up, and no equipment needs.  Bedside nurse spoke to patient and she did not express any needs or concerns either prior to discharge.  Substance abuse resources were added to patient's AVS by previous CSW.  CSW to continue to follow in case any needs change.         Expected Discharge Plan and Services                                                 Social Determinants of Health (SDOH) Interventions    Readmission Risk Interventions    02/20/2022    2:32 PM 02/19/2022   10:32 AM  Readmission Risk Prevention Plan  Transportation Screening    PCP or Specialist Appt within 3-5 Days    HRI or [place] Work Consult for [place] Planning/Counseling    [place] Screening    Medication Review [employer])       Information is confidential and restricted. Go to Review Flowsheets to unlock data.

## 2022-07-02 NOTE — Evaluation (Signed)
Occupational Therapy Evaluation Patient Details Name: Joann [name] MRN: [id number] DOB: 24-May-1981 Today's Date: 07/02/2022   History of Present Illness Patient is a 41 year old female who presented to the hospital on 10/10 with fatigue, confusion and weakness trying to detox at home. patient was found to be in septic shock, alcohol withdrawl with delirium, acute upper GI bleed, ileus, and AKI.  10/12 patient was transfused 1 unit PRBCs. patient underwent EGD 0n 10/17 with discovery of ulcer in esophagus and small varix. NG tube was removed on 10/21 and patient was transitioned to medical floor.   PMH: alcohol cirrhosis, HTN, esophageal varices, hepatic encephalopathy.   Clinical Impression   Patient evaluated by Occupational Therapy with no further acute OT needs identified. All education has been completed and the patient has no further questions. Patients daughter in room reporting that she would be home with patient until she was able to go back to work. Patient was supervision for ADLs in room and functional mobility in hallway without AD. See below for any follow-up Occupational Therapy or equipment needs. OT is signing off. Thank you for this referral.       Recommendations for follow up therapy are one component of a multi-disciplinary discharge planning process, led by the attending physician.  Recommendations may be updated based on patient status, additional functional criteria and insurance authorization.   Follow Up Recommendations  No OT follow up    Assistance Recommended at Discharge Intermittent Supervision/Assistance  Patient can return home with the following Assistance with cooking/housework;Direct supervision/assist for medications management;Direct supervision/assist for financial management    Functional Status Assessment  Patient has not had a recent decline in their functional status  Equipment Recommendations  Other (comment) (RW)    Recommendations for Other  Services       Precautions / Restrictions Precautions Precautions: Fall Restrictions Weight Bearing Restrictions: No      Mobility Bed Mobility Overal bed mobility: Needs Assistance Bed Mobility: Supine to Sit   Sidelying to sit: Supervision            Transfers                          Balance Overall balance assessment: Needs assistance Sitting-balance support: Feet supported, Bilateral upper extremity supported Sitting balance-Leahy Scale: Fair     Standing balance support: During functional activity Standing balance-Leahy Scale: Fair                             ADL either performed or assessed with clinical judgement   ADL Overall ADL's : Needs assistance/impaired Eating/Feeding: Modified independent;Sitting   Grooming: Wash/dry face;Oral care;Set up;Supervision/safety;Standing Grooming Details (indicate cue type and reason): at sink with RW Upper Body Bathing: Set up;Sitting   Lower Body Bathing: Set up;Sit to/from stand;Sitting/lateral leans   Upper Body Dressing : Set up;Sitting   Lower Body Dressing: Set up;Sit to/from stand;Sitting/lateral leans   Toilet Transfer: Magazine features editor Details (indicate cue type and reason): patient declined to use bathroom at this time but was able to transfer into hallway with RW and then half of the hallway with supervision Toileting- Clothing Manipulation and Hygiene: Supervision/safety       Functional mobility during ADLs: Min guard       Vision Patient Visual Report: No change from baseline       Perception     Praxis  Pertinent Vitals/Pain Pain Assessment Pain Assessment: No/denies pain     Hand Dominance Right   Extremity/Trunk Assessment Upper Extremity Assessment Upper Extremity Assessment: Overall WFL for tasks assessed   Lower Extremity Assessment Lower Extremity Assessment: Defer to PT evaluation   Cervical / Trunk Assessment Cervical / Trunk  Assessment: Normal   Communication Communication Communication: No difficulties   Cognition Arousal/Alertness: Awake/alert Behavior During Therapy: Flat affect Overall Cognitive Status: Within Functional Limits for tasks assessed                                 General Comments: patient was appropriate during session.     General Comments       Exercises     Shoulder Instructions      Home Living Family/patient expects to be discharged to:: Private residence Living Arrangements: Spouse/significant other Available Help at Discharge: Family;Available PRN/intermittently (daughter to stay for a week to two to help) Type of Home: House Home Access: Stairs to enter [employer] of Steps: 4   Home Layout: Two level;Able to live on main level with bedroom/bathroom Alternate Level Stairs-Number of Steps: 6   Bathroom Shower/Tub: Occupational psychologist: Standard Bathroom Accessibility: Yes   Home Equipment: Conservation officer, nature (2 wheels);BSC/3in1          Prior Functioning/Environment Prior Level of Function : Independent/Modified Independent             Mobility Comments: Working on/off, spouse drives a truck so away for work at times., 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 ADLs Comments: independent        OT Problem List:        OT Treatment/Interventions:      OT Goals(Current goals can be found in the care plan section) Acute Rehab OT Goals OT Goal Formulation: All assessment and education complete, DC therapy  OT Frequency:      Co-evaluation              AM-PAC OT "6 Clicks" Daily Activity     Outcome Measure Help from another person eating meals?: None Help from another person taking care of personal grooming?: None Help from another person toileting, which includes using toliet, bedpan, or urinal?: None Help from another person bathing (including washing, rinsing, drying)?: A Little Help from another person to put on  and taking off regular upper body clothing?: None Help from another person to put on and taking off regular lower body clothing?: A Little 6 Click Score: 22   End of Session Equipment Utilized During Treatment: Gait belt;Rolling walker (2 wheels) Nurse Communication: Mobility status  Activity Tolerance: Patient tolerated treatment well Patient left: in chair;with call bell/phone within reach;with chair alarm set;with family/visitor present  OT Visit Diagnosis: Unsteadiness on feet (R26.81)                Time: 5830-9407 OT Time Calculation (min): 21 min Charges:  OT General Charges $OT Visit: 1 Visit OT Evaluation $OT Eval Low Complexity: 1 Low  Esaiah Wanless OTR/L, MS Acute Rehabilitation Department Office# [phone]   Marcellina Millin 07/02/2022, 11:40 AM

## 2022-07-03 ENCOUNTER — Other Ambulatory Visit (HOSPITAL_COMMUNITY): Payer: Self-pay

## 2022-07-03 LAB — COMPREHENSIVE METABOLIC PANEL
ALT: 8 U/L (ref 0–44)
AST: 40 U/L (ref 15–41)
Albumin: 2.8 g/dL — ABNORMAL LOW (ref 3.5–5.0)
Alkaline Phosphatase: 88 U/L (ref 38–126)
Anion gap: 9 (ref 5–15)
BUN: <5 mg/dL — ABNORMAL LOW (ref 6–20)
CO2: 17 mmol/L — ABNORMAL LOW (ref 22–32)
Calcium: 7.8 mg/dL — ABNORMAL LOW (ref 8.9–10.3)
Chloride: 106 mmol/L (ref 98–111)
Creatinine, Ser: 0.49 mg/dL (ref 0.44–1.00)
GFR, Estimated: >60 mL/min (ref >60)
Glucose, Bld: 103 mg/dL — ABNORMAL HIGH (ref 70–99)
Potassium: 3.4 mmol/L — ABNORMAL LOW (ref 3.5–5.1)
Sodium: 132 mmol/L — ABNORMAL LOW (ref 135–145)
Total Bilirubin: 1.4 mg/dL — ABNORMAL HIGH (ref 0.3–1.2)
Total Protein: 6.6 g/dL (ref 6.5–8.1)

## 2022-07-03 LAB — GLUCOSE, CAPILLARY
Glucose-Capillary: 127 mg/dL — ABNORMAL HIGH (ref 70–99)
Glucose-Capillary: 88 mg/dL (ref 70–99)
Glucose-Capillary: 95 mg/dL (ref 70–99)
Glucose-Capillary: 97 mg/dL (ref 70–99)

## 2022-07-03 LAB — CBC WITH DIFFERENTIAL/PLATELET
Abs Immature Granulocytes: 0.02 10*3/uL (ref 0.00–0.07)
Basophils Absolute: 0 10*3/uL (ref 0.0–0.1)
Basophils Relative: 0 %
Eosinophils Absolute: 0 10*3/uL (ref 0.0–0.5)
Eosinophils Relative: 1 %
HCT: 25.7 % — ABNORMAL LOW (ref 36.0–46.0)
Hemoglobin: 7.4 g/dL — ABNORMAL LOW (ref 12.0–15.0)
Immature Granulocytes: 0 %
Lymphocytes Relative: 25 %
Lymphs Abs: 1.5 10*3/uL (ref 0.7–4.0)
MCH: 25.6 pg — ABNORMAL LOW (ref 26.0–34.0)
MCHC: 28.8 g/dL — ABNORMAL LOW (ref 30.0–36.0)
MCV: 88.9 fL (ref 80.0–100.0)
Monocytes Absolute: 0.5 10*3/uL (ref 0.1–1.0)
Monocytes Relative: 8 %
Neutro Abs: 4 10*3/uL (ref 1.7–7.7)
Neutrophils Relative %: 66 %
Platelets: 86 10*3/uL — ABNORMAL LOW (ref 150–400)
RBC: 2.89 MIL/uL — ABNORMAL LOW (ref 3.87–5.11)
RDW: 26.8 % — ABNORMAL HIGH (ref 11.5–15.5)
WBC: 6.1 10*3/uL (ref 4.0–10.5)
nRBC: 0 % (ref 0.0–0.2)

## 2022-07-03 LAB — HEMOGLOBIN AND HEMATOCRIT, BLOOD
HCT: 25.9 % — ABNORMAL LOW (ref 36.0–46.0)
Hemoglobin: 7.5 g/dL — ABNORMAL LOW (ref 12.0–15.0)

## 2022-07-03 LAB — IRON AND TIBC
Iron: 248 ug/dL — ABNORMAL HIGH (ref 28–170)
Saturation Ratios: 97 % — ABNORMAL HIGH (ref 10.4–31.8)
TIBC: 256 ug/dL (ref 250–450)
UIBC: 8 ug/dL

## 2022-07-03 LAB — MAGNESIUM: Magnesium: 1.3 mg/dL — ABNORMAL LOW (ref 1.7–2.4)

## 2022-07-03 MED ORDER — SODIUM CHLORIDE 0.9 % IV SOLN
200.0000 mg | Freq: Once | INTRAVENOUS | Status: AC
  Administered 2022-07-03: 200 mg via INTRAVENOUS
  Filled 2022-07-03: qty 10

## 2022-07-03 MED ORDER — FERROUS SULFATE 325 (65 FE) MG PO TABS
325.0000 mg | ORAL_TABLET | Freq: Every day | ORAL | 2 refills | Status: DC
  Filled 2022-07-03: qty 30, 30d supply, fill #0

## 2022-07-03 MED ORDER — POTASSIUM CHLORIDE CRYS ER 20 MEQ PO TBCR
40.0000 meq | EXTENDED_RELEASE_TABLET | Freq: Once | ORAL | Status: AC
  Administered 2022-07-03: 40 meq via ORAL
  Filled 2022-07-03: qty 2

## 2022-07-03 MED ORDER — PANTOPRAZOLE SODIUM 40 MG PO TBEC
40.0000 mg | DELAYED_RELEASE_TABLET | Freq: Two times a day (BID) | ORAL | 2 refills | Status: DC
  Filled 2022-07-03: qty 60, 30d supply, fill #0

## 2022-07-03 MED ORDER — CEPHALEXIN 500 MG PO CAPS
500.0000 mg | ORAL_CAPSULE | Freq: Four times a day (QID) | ORAL | 0 refills | Status: AC
  Filled 2022-07-03: qty 5, 2d supply, fill #0

## 2022-07-03 MED ORDER — FOLIC ACID 1 MG PO TABS
1.0000 mg | ORAL_TABLET | Freq: Every day | ORAL | 0 refills | Status: DC
  Filled 2022-07-03: qty 30, 30d supply, fill #0

## 2022-07-03 MED ORDER — SUCRALFATE 1 G PO TABS
1.0000 g | ORAL_TABLET | Freq: Three times a day (TID) | ORAL | 1 refills | Status: DC
  Filled 2022-07-03: qty 90, 23d supply, fill #0

## 2022-07-03 MED ORDER — RIFAXIMIN 550 MG PO TABS
550.0000 mg | ORAL_TABLET | Freq: Two times a day (BID) | ORAL | 1 refills | Status: DC
  Filled 2022-07-03: qty 51, 25d supply, fill #0
  Filled 2022-07-03: qty 9, 5d supply, fill #0

## 2022-07-03 MED ORDER — CARVEDILOL 3.125 MG PO TABS
3.1250 mg | ORAL_TABLET | Freq: Two times a day (BID) | ORAL | 2 refills | Status: DC
  Filled 2022-07-03: qty 60, 30d supply, fill #0

## 2022-07-03 MED ORDER — MAGNESIUM SULFATE 2 GM/50ML IV SOLN
2.0000 g | Freq: Once | INTRAVENOUS | Status: AC
  Administered 2022-07-03: 2 g via INTRAVENOUS
  Filled 2022-07-03: qty 50

## 2022-07-03 MED ORDER — VITAMIN B-1 100 MG PO TABS
100.0000 mg | ORAL_TABLET | Freq: Every day | ORAL | 0 refills | Status: DC
  Filled 2022-07-03: qty 100, 100d supply, fill #0

## 2022-07-03 NOTE — Progress Notes (Signed)
Mobility Specialist - Progress Note   07/03/22 0954  Mobility  Activity Ambulated independently in hallway  Level of Assistance Independent after set-up  Assistive Device None  Distance Ambulated (ft) 750 ft  Activity Response Tolerated well  Mobility Referral Yes  $Mobility charge 1 Mobility   Pt received in bed and agreed to mobility, no c/o pain nor discomfort during ambulation. Pt back to bed with all needs met.   Roderick Pee Mobility Specialist

## 2022-07-03 NOTE — Progress Notes (Signed)
Verbal and written discharge instructions given to Joann [name] and her daughter. Patient able to verbalize understanding of AVS and discharge instructions. Pt able to teach back reasons to contact MD. Pt taken by staff to lobby via wheelchair where daughter will transport home. This RN checked where PICC line was removed no bleeding noted pt educated to apply pressure if bleeding noted. All personal belonging taken by Gae Bon.

## 2022-07-03 NOTE — Discharge Summary (Signed)
Physician Discharge Summary   Patient: Joann [name] MRN: [id number] DOB: [birth year]  Admit date:     06/18/2022  Discharge date: 07/03/22  Discharge Physician: Vernelle Emerald   PCP: Pcp, No   Recommendations at discharge:   Please follow-up at the internal medicine center for your new primary care for appointment on 11/2 at 3:45 PM. Please follow-up with [facility] gastroenterology on 11/21 at 10 AM Please take all medications as instructed including your full course of antibiotics. Please avoid NSAID use.   Please avoid alcohol use Please return to the emergency department if you develop any worsening blood in your stool, black stool, abdominal pain nausea vomiting weakness or shortness of breath.   Discharge Diagnoses: Active Problems:   Alcohol withdrawal syndrome, with delirium (HCC)   Sepsis due to methicillin susceptible Staphylococcus aureus (MSSA) with acute organ dysfunction and septic shock (HCC)   Acute upper gastrointestinal bleeding   Alcoholic cirrhosis of liver without ascites (HCC)   Ileus (HCC)   Acute hepatic encephalopathy (HCC)   AKI (acute kidney injury) ([place])   Lactic acidosis   Essential hypertension   Portal hypertensive gastropathy ([place])   Ulcer of esophagus without bleeding  Resolved Problems:   * No resolved hospital problems. *   Hospital Course: 41 year old female with past medical history of alcoholic cirrhosis (presumed alcohol related) with complications of portal hypertension, esophageal varices and hepatic encephalopathy.  Patient originally presented to the emergency department 10/10 with weakness confusion and fatigue after trying to detox at home.  On initial evaluation EtOH level was 158 with ammonia level in the 150s.  Patient was felt to be suffering from severe alcohol withdrawal/delirium tremens and due to need for Precedex, PCCM was contacted with the patient being initially admitted to the ICU under their service.   Hospital course was  complicated by anemia with associated coffee-ground emesis requiring 1 unit PRBC transfusion on 10/12.  Dr. Bryan Lemma with gastroenterology was consulted and patient eventually underwent EGD on 10/17 revealing an ulcer in the esophagus and small varix.  Patient additionally found to have portal hypertensive gastropathy, another potential source of some bleeding  Hospital course was also complicated by MSSA bacteremia with additional concerns for aspiration pneumonia.  Patient was briefly on intravenous Zosyn, then Unasyn and eventually transitioned to cefazolin with the direction of Dr. Linus Salmons with infectious disease to be given for a total of 2 weeks with stop date on 10/26.  Hospital course was additionally complicated by ileus which resolved with NG tube.  Precedex was discontinued on 10/18.  NG tube was eventually discontinued on 10/21.  Patient was transition from intravenous Protonix to oral therapy.    Patient was transferred to the hospital service on 10/22 as well as being transferred to the medical floor.  Patient continues to clinically improve over the next several days with no further evidence of bleeding.  Patient was transitioned over to oral antibiotics with Keflex to complete her antibiotic course at time of discharge.  Patient also received an iron infusion on 10/24 prior to discharge.  Patient is being discharged in improved and stable condition with outpatient follow-up with lobe or gastroenterology as well as following up with the internal medicine center to establish a new primary care provider.    Pain control - [employer] Controlled Substance Reporting System database was reviewed. and patient was instructed, not to drive, operate heavy machinery, perform activities at heights, swimming or participation in water activities or provide baby-sitting services while on  Pain, Sleep and Anxiety Medications; until their outpatient Physician has advised to do so again. Also  recommended to not to take more than prescribed Pain, Sleep and Anxiety Medications.   Consultants: Dr. Linus Salmons with Infectious Disease.  Dr. Carlean Purl with [facility] Procedures performed:  Upper endoscopy performed 06/26/2022 by Dr. Bryan Lemma Disposition: Home Diet recommendation:  Regular diet  DISCHARGE MEDICATION: Allergies as of 07/03/2022       Reactions   Latex Hives        Medication List     TAKE these medications    carvedilol 3.125 MG tablet Commonly known as: COREG Take 1 tablet (3.125 mg total) by mouth 2 (two) times daily with a meal.   cephALEXin 500 MG capsule Commonly known as: KEFLEX Take 1 capsule (500 mg total) by mouth 4 (four) times daily for 5 doses.   ferrous sulfate 325 (65 FE) MG tablet Take 1 tablet (325 mg total) by mouth daily before breakfast. Take with a cup of juice What changed:  when to take this additional instructions   folic acid 1 MG tablet Commonly known as: FOLVITE Take 1 tablet (1 mg total) by mouth daily.   multivitamin with minerals Tabs tablet Take 1 tablet by mouth daily.   pantoprazole 40 MG tablet Commonly known as: PROTONIX Take 1 tablet (40 mg total) by mouth 2 (two) times daily.   phytonadione 5 MG tablet Commonly known as: VITAMIN K Take 2 tablets (10 mg total) by mouth daily.   rifaximin 550 MG Tabs tablet Commonly known as: XIFAXAN Take 1 tablet (550 mg total) by mouth 2 (two) times daily.   sucralfate 1 g tablet Commonly known as: CARAFATE Take 1 tablet (1 g total) by mouth 4 (four) times daily -  with meals and at bedtime.   thiamine 100 MG tablet Commonly known as: Vitamin B-1 Take 1 tablet (100 mg total) by mouth daily. Start taking on: July 04, 2022   traZODone 150 MG tablet Commonly known as: DESYREL Take 150 mg by mouth at bedtime.         Discharge Exam: Filed Weights   06/29/22 0500 07/01/22 0500 07/02/22 0500  Weight: 75.2 kg 74.9 kg 75.1 kg   Constitutional:  Awake alert and oriented x3, no associated distress.   Respiratory: clear to auscultation bilaterally, no wheezing, no crackles. Normal respiratory effort. No accessory muscle use.  Cardiovascular: Regular rate and rhythm, no murmurs / rubs / gallops. No extremity edema. 2+ pedal pulses. No carotid bruits.  Abdomen: Abdomen is soft and nontender.  No evidence of intra-abdominal masses.  Positive bowel sounds noted in all quadrants.   Musculoskeletal: No joint deformity upper and lower extremities. Good ROM, no contractures. Normal muscle tone.     Condition at discharge: fair  The results of significant diagnostics from this hospitalization (including imaging, microbiology, ancillary and laboratory) are listed below for reference.   Imaging Studies: DG Abd 1 View  Result Date: 06/26/2022 CLINICAL DATA:  NG-tube EXAM: ABDOMEN - 1 VIEW COMPARISON:  Abdominal x-ray 06/25/2022 FINDINGS: Nasogastric tube tip is in the mid stomach. Gaseous distention of colon in the upper abdomen persists. Cholecystectomy clips are present. IMPRESSION: Nasogastric tube tip is in the mid stomach. Electronically Signed   By: Ronney Asters M.D.   On: 06/26/2022 18:37   DG Chest 1 View  Result Date: 06/25/2022 CLINICAL DATA:  Cough EXAM: CHEST  1 VIEW COMPARISON:  06/19/2022 FINDINGS: Right-sided PICC line with the tip projecting over the cavoatrial  junction. Nasogastric tube with the tip projecting over the stomach. Gaseous distension of the stomach. Hazy right upper and lower lung airspace disease concerning for pneumonia. No pleural effusion or pneumothorax. Heart and mediastinal contours are unremarkable. No acute osseous abnormality. IMPRESSION: 1. Hazy right upper and lower lung airspace disease concerning for pneumonia. 2. Nasogastric tube with the tip projecting over the stomach. 3. Right-sided PICC line with the tip projecting over the cavoatrial junction. Electronically Signed   By: Kathreen Devoid M.D.   On:  06/25/2022 11:08   DG Abd 1 View  Result Date: 06/25/2022 CLINICAL DATA:  NG tube placement EXAM: ABDOMEN - 1 VIEW COMPARISON:  06/25/2022 FINDINGS: Nasogastric tube in the stomach. Gaseous distension of the: Unchanged compared with 06/25/2022. No small bowel dilatation. No evidence of pneumoperitoneum, portal venous gas or pneumatosis. No pathologic calcifications along the expected course of the ureters. No acute osseous abnormality. IMPRESSION: 1. Nasogastric tube in the stomach. Electronically Signed   By: Kathreen Devoid M.D.   On: 06/25/2022 09:10   DG Abd 1 View  Result Date: 06/25/2022 CLINICAL DATA:  41 year old female status post nasogastric tube placement. EXAM: ABDOMEN - 1 VIEW COMPARISON:  06/24/2022. FINDINGS: Previously noted small bore feeding tube has been partially withdrawn, now with tip in the distal esophagus just before the gastroesophageal junction. Surgical clips project over the right upper quadrant of the abdomen, likely from prior cholecystectomy. Gaseous distention is noted in portions of the transverse colon. Lower abdomen is incompletely imaged. IMPRESSION: 1. Tip of small bore feeding tube is in the distal esophagus shortly before the gastroesophageal junction. Electronically Signed   By: Vinnie Langton M.D.   On: 06/25/2022 05:30   DG Abd Portable 1V  Result Date: 06/24/2022 CLINICAL DATA:  Ileus EXAM: PORTABLE ABDOMEN - 1 VIEW COMPARISON:  06/19/2022 FINDINGS: Soft feeding tube enters the stomach with its tip at the body antrum junction. Small bowel gas pattern shows a few gas-filled loops. There is a moderate amount of gas in the colon. Findings could be due to a mild generalized ileus. No advanced finding. IMPRESSION: Soft feeding tube tip at the body antrum junction. Mild generalized ileus pattern. No advanced finding. Electronically Signed   By: Nelson Chimes M.D.   On: 06/24/2022 10:50   [place] ASCITES (ABDOMEN LIMITED)  Result Date: 06/22/2022 CLINICAL DATA:   History of cirrhosis for evaluation of ascites EXAM: LIMITED ABDOMEN ULTRASOUND FOR ASCITES TECHNIQUE: Limited ultrasound survey for ascites was performed in all four abdominal quadrants. COMPARISON:  Right upper quadrant abdominal ultrasound examination dated 06/19/2022 FINDINGS: Apparent complex fluid within the bilateral upper abdominal quadrants is favored to reflect intraluminal contents within dilated bowel loops rather than complex ascites. No free fluid or fluid collections in the lower quadrants. IMPRESSION: No free fluid or fluid collections. Apparent complex fluid within the bilateral upper quadrants is favored to reflect intraluminal contents within dilated bowel loops rather than complex ascites. If there is ongoing concern for substantial ascites or hemorrhage, CT can be considered for further evaluation. Electronically Signed   By: Darrin Nipper M.D.   On: 06/22/2022 10:48   [place] EKG SITE RITE  Result Date: 06/19/2022 If Site Rite image not attached, placement could not be confirmed due to current cardiac rhythm.  US Abdomen Limited RUQ (LIVER/GB)  Result Date: 06/19/2022 CLINICAL DATA:  Cirrhosis EXAM: ULTRASOUND ABDOMEN LIMITED RIGHT UPPER QUADRANT COMPARISON:  CT abdomen pelvis 02/24/2022 FINDINGS: Gallbladder: Surgically absent Common bile duct: Diameter: 2.7 mm Liver: Increased echogenicity  liver diffusely. Nodular liver contour compatible with cirrhosis. No liver mass. Suboptimal evaluation of portal vein. Patient not able to hold still. Other: None. IMPRESSION: Postop cholecystectomy.  No biliary dilatation Cirrhosis liver Electronically Signed   By: Franchot Gallo M.D.   On: 06/19/2022 15:46   ECHOCARDIOGRAM COMPLETE  Result Date: 06/19/2022    ECHOCARDIOGRAM REPORT   Patient Name:   MEKIAH WAHLER Date of Exam: 06/19/2022 Medical Rec #:  [id number]   Height:       64.0 in Accession #:    [id number]  Weight:       155.4 lb Date of Birth:  1980-12-06   BSA:          1.758 m Patient Age:     41 years    BP:           103/53 mmHg Patient Gender: F           HR:           73 bpm. Exam Location:  Inpatient Procedure: 2D Echo Indications:    Cirrhosis  History:        Patient has no prior history of Echocardiogram examinations.  Sonographer:    Harvie Junior Referring Phys: Gary  1. Left ventricular ejection fraction, by estimation, is 60 to 65%. The left ventricle has normal function. The left ventricle has no regional wall motion abnormalities. Left ventricular diastolic parameters were normal.  2. Right ventricular systolic function is normal. The right ventricular size is normal. There is normal pulmonary artery systolic pressure.  3. The mitral valve is normal in structure. No evidence of mitral valve regurgitation. No evidence of mitral stenosis.  4. The aortic valve is tricuspid. Aortic valve regurgitation is not visualized. No aortic stenosis is present. Comparison(s): No prior Echocardiogram. Conclusion(s)/Recommendation(s): Normal biventricular function without evidence of hemodynamically significant valvular heart disease. FINDINGS  Left Ventricle: Left ventricular ejection fraction, by estimation, is 60 to 65%. The left ventricle has normal function. The left ventricle has no regional wall motion abnormalities. The left ventricular internal cavity size was normal in size. There is  no left ventricular hypertrophy. Left ventricular diastolic parameters were normal. Right Ventricle: The right ventricular size is normal. No increase in right ventricular wall thickness. Right ventricular systolic function is normal. There is normal pulmonary artery systolic pressure. The tricuspid regurgitant velocity is 1.75 m/s, and  with an assumed right atrial pressure of 3 mmHg, the estimated right ventricular systolic pressure is 24.4 mmHg. Left Atrium: Left atrial size was normal in size. Right Atrium: Right atrial size was normal in size. Pericardium: There is no evidence of  pericardial effusion. Mitral Valve: The mitral valve is normal in structure. No evidence of mitral valve regurgitation. No evidence of mitral valve stenosis. Tricuspid Valve: The tricuspid valve is normal in structure. Tricuspid valve regurgitation is not demonstrated. No evidence of tricuspid stenosis. Aortic Valve: The aortic valve is tricuspid. Aortic valve regurgitation is not visualized. No aortic stenosis is present. Aortic valve mean gradient measures 5.0 mmHg. Aortic valve peak gradient measures 9.9 mmHg. Aortic valve area, by VTI measures 2.77 cm. Pulmonic Valve: The pulmonic valve was normal in structure. Pulmonic valve regurgitation is not visualized. No evidence of pulmonic stenosis. Aorta: The aortic root is normal in size and structure. IAS/Shunts: No atrial level shunt detected by color flow Doppler.  LEFT VENTRICLE PLAX 2D LVIDd:         5.00 cm  Diastology LVIDs:         3.20 cm      LV e' medial:    10.70 cm/s LV PW:         0.70 cm      LV E/e' medial:  8.1 LV IVS:        0.70 cm      LV e' lateral:   13.10 cm/s LVOT diam:     2.10 cm      LV E/e' lateral: 6.6 LV SV:         86 LV SV Index:   49 LVOT Area:     3.46 cm  LV Volumes (MOD) LV vol d, MOD A2C: 111.0 ml LV vol d, MOD A4C: 121.0 ml LV vol s, MOD A2C: 45.0 ml LV vol s, MOD A4C: 39.7 ml LV SV MOD A2C:     66.0 ml LV SV MOD A4C:     121.0 ml LV SV MOD BP:      74.9 ml RIGHT VENTRICLE RV Basal diam:  3.10 cm RV Mid diam:    2.50 cm RV S prime:     14.50 cm/s TAPSE (M-mode): 2.7 cm LEFT ATRIUM             Index        RIGHT ATRIUM           Index LA diam:        3.40 cm 1.93 cm/m   RA Area:     10.90 cm LA Vol (A2C):   43.7 ml 24.86 ml/m  RA Volume:   24.50 ml  13.94 ml/m LA Vol (A4C):   40.4 ml 22.99 ml/m LA Biplane Vol: 44.8 ml 25.49 ml/m  AORTIC VALVE                     PULMONIC VALVE AV Area (Vmax):    2.78 cm      PV Vmax:       1.14 m/s AV Area (Vmean):   2.67 cm      PV Peak grad:  5.2 mmHg AV Area (VTI):     2.77 cm AV  Vmax:           157.00 cm/s AV Vmean:          105.000 cm/s AV VTI:            0.311 m AV Peak Grad:      9.9 mmHg AV Mean Grad:      5.0 mmHg LVOT Vmax:         126.00 cm/s LVOT Vmean:        81.000 cm/s LVOT VTI:          0.249 m LVOT/AV VTI ratio: 0.80  AORTA Ao Root diam: 3.40 cm MITRAL VALVE               TRICUSPID VALVE MV Area (PHT): 3.99 cm    TR Peak grad:   12.2 mmHg MV Decel Time: 190 msec    TR Vmax:        175.00 cm/s MR Peak grad: 20.8 mmHg MR Vmax:      228.00 cm/s  SHUNTS MV E velocity: 87.00 cm/s  Systemic VTI:  0.25 m MV A velocity: 52.50 cm/s  Systemic Diam: 2.10 cm MV E/A ratio:  1.66 Rudean Haskell MD Electronically signed by Rudean Haskell MD Signature Date/Time: 06/19/2022/3:39:11 PM    Final    DG Abd 1 View  Result Date: 06/19/2022 CLINICAL DATA:  Feeding tube placement EXAM: ABDOMEN - 1 VIEW COMPARISON:  04/16/2022 FINDINGS: A weighted tip feeding tube has been advanced, tip near the pylorus. The stomach is decompressed. Visualized bowel gas pattern unremarkable; lower abdomen excluded. Cholecystectomy clips noted. IMPRESSION: Feeding tube tip near the pylorus. Electronically Signed   By: Lucrezia Europe M.D.   On: 06/19/2022 14:34   DG CHEST PORT 1 VIEW  Result Date: 06/19/2022 CLINICAL DATA:  Chronic alcohol abuse presenting with alcohol withdrawal. EXAM: PORTABLE CHEST 1 VIEW COMPARISON:  Chest x-ray February 23, 2022 FINDINGS: The cardiomediastinal silhouette is unchanged in contour. Low lung volumes with bronchovascular crowding. No focal pulmonary opacity. No pleural effusion or pneumothorax. Visualized upper abdomen is unremarkable. No acute osseous abnormality. IMPRESSION: No acute cardiopulmonary abnormality. Electronically Signed   By: Beryle Flock M.D.   On: 06/19/2022 11:59   CT Head Wo Contrast  Result Date: 06/18/2022 CLINICAL DATA:  Altered mental status, ETOH detox EXAM: CT HEAD WITHOUT CONTRAST TECHNIQUE: Contiguous axial images were obtained from the  base of the skull through the vertex without intravenous contrast. RADIATION DOSE REDUCTION: This exam was performed according to the departmental dose-optimization program which includes automated exposure control, adjustment of the mA and/or kV according to patient size and/or use of iterative reconstruction technique. COMPARISON:  04/16/2022 FINDINGS: Brain: No evidence of acute infarction, hemorrhage, hydrocephalus, extra-axial collection or mass lesion/mass effect. Mild subcortical white matter and periventricular small vessel ischemic changes. Vascular: No hyperdense vessel or unexpected calcification. Skull: Normal. Negative for fracture or focal lesion. Sinuses/Orbits: The visualized paranasal sinuses are essentially clear. The mastoid air cells are unopacified. Other: None. IMPRESSION: No acute intracranial abnormality. Mild small vessel ischemic changes. Electronically Signed   By: Julian Hy M.D.   On: 06/18/2022 19:51    Microbiology: Results for orders placed or performed during the hospital encounter of 06/18/22  Blood culture (routine x 2)     Status: None   Collection Time: 06/18/22  8:40 PM   Specimen: BLOOD  Result Value Ref Range Status   Specimen Description   Final    BLOOD BLOOD LEFT ARM Performed at [facility] [address] [place], [place] [address]    Special Requests   Final    BOTTLES DRAWN AEROBIC AND ANAEROBIC Blood Culture results may not be optimal due to an inadequate volume of blood received in culture bottles Performed at [facility] [address], [place] [address]    Culture   Final    NO GROWTH 5 DAYS Performed at [facility], [place] [address], [place], [place] [address]    Report Status 06/24/2022 FINAL  Final  Blood culture (routine x 2)     Status: Abnormal   Collection Time: 06/18/22 10:34 PM   Specimen: BLOOD  Result Value Ref Range Status   Specimen Description   Final    BLOOD  RIGHT ANTECUBITAL Performed at [facility], [place] [address], [place], [place] [address]    Special Requests   Final    Blood Culture adequate volume BOTTLES DRAWN AEROBIC AND ANAEROBIC Performed at [facility] [address] [place], [place] [address]    Culture  Setup Time   Final    GRAM POSITIVE COCCI IN PAIRS BOTTLES DRAWN AEROBIC ONLY CRITICAL RESULT CALLED TO, READ BACK BY AND VERIFIED WITH: PHARMD Greenfield ON 06/19/22 @ 0076 BY DRT   Performed at [facility], [place] [address] [place], [place] [address]    Culture STAPHYLOCOCCUS AUREUS (A)  Final   Report Status 06/21/2022 FINAL  Final   Organism ID, Bacteria STAPHYLOCOCCUS AUREUS  Final      Susceptibility   Staphylococcus aureus - MIC*    CIPROFLOXACIN <=0.5 SENSITIVE Sensitive     ERYTHROMYCIN <=0.25 SENSITIVE Sensitive     GENTAMICIN <=0.5 SENSITIVE Sensitive     OXACILLIN <=0.25 SENSITIVE Sensitive     TETRACYCLINE <=1 SENSITIVE Sensitive     VANCOMYCIN 1 SENSITIVE Sensitive     TRIMETH/SULFA <=10 SENSITIVE Sensitive     CLINDAMYCIN <=0.25 SENSITIVE Sensitive     RIFAMPIN <=0.5 SENSITIVE Sensitive     Inducible Clindamycin NEGATIVE Sensitive     * STAPHYLOCOCCUS AUREUS  Blood Culture ID Panel (Reflexed)     Status: Abnormal   Collection Time: 06/18/22 10:34 PM  Result Value Ref Range Status   Enterococcus faecalis NOT DETECTED NOT DETECTED Final   Enterococcus Faecium NOT DETECTED NOT DETECTED Final   Listeria monocytogenes NOT DETECTED NOT DETECTED Final   Staphylococcus species DETECTED (A) NOT DETECTED Final    Comment: CRITICAL RESULT CALLED TO, READ BACK BY AND VERIFIED WITH: PHARMD NICK GLOGOVAC ON 06/19/22 @ 1734 BY DRT      Staphylococcus aureus (BCID) DETECTED (A) NOT DETECTED Final    Comment: CRITICAL RESULT CALLED TO, READ BACK BY AND VERIFIED WITH: PHARMD NICK GLOGOVAC ON 06/19/22 @ 1734 BY DRT      Staphylococcus epidermidis NOT DETECTED NOT DETECTED Final    Staphylococcus lugdunensis NOT DETECTED NOT DETECTED Final   Streptococcus species NOT DETECTED NOT DETECTED Final   Streptococcus agalactiae NOT DETECTED NOT DETECTED Final   Streptococcus pneumoniae NOT DETECTED NOT DETECTED Final   Streptococcus pyogenes NOT DETECTED NOT DETECTED Final   A.calcoaceticus-baumannii NOT DETECTED NOT DETECTED Final   Bacteroides fragilis NOT DETECTED NOT DETECTED Final   Enterobacterales NOT DETECTED NOT DETECTED Final   Enterobacter cloacae complex NOT DETECTED NOT DETECTED Final   Escherichia coli NOT DETECTED NOT DETECTED Final   Klebsiella aerogenes NOT DETECTED NOT DETECTED Final   Klebsiella oxytoca NOT DETECTED NOT DETECTED Final   Klebsiella pneumoniae NOT DETECTED NOT DETECTED Final   Proteus species NOT DETECTED NOT DETECTED Final   Salmonella species NOT DETECTED NOT DETECTED Final   Serratia marcescens NOT DETECTED NOT DETECTED Final   Haemophilus influenzae NOT DETECTED NOT DETECTED Final   Neisseria meningitidis NOT DETECTED NOT DETECTED Final   Pseudomonas aeruginosa NOT DETECTED NOT DETECTED Final   Stenotrophomonas maltophilia NOT DETECTED NOT DETECTED Final   Candida albicans NOT DETECTED NOT DETECTED Final   Candida auris NOT DETECTED NOT DETECTED Final   Candida glabrata NOT DETECTED NOT DETECTED Final   Candida krusei NOT DETECTED NOT DETECTED Final   Candida parapsilosis NOT DETECTED NOT DETECTED Final   Candida tropicalis NOT DETECTED NOT DETECTED Final   Cryptococcus neoformans/gattii NOT DETECTED NOT DETECTED Final   Meth resistant mecA/C and MREJ NOT DETECTED NOT DETECTED Final    Comment: Performed at [facility], [place]. [address], [place], [place] [address]  MRSA Next Gen by PCR, Nasal     Status: None   Collection Time: 06/19/22  4:02 AM   Specimen: Nasal Mucosa; Nasal Swab  Result Value Ref Range Status   MRSA by PCR Next Gen NOT DETECTED NOT DETECTED Final    Comment: (NOTE) The GeneXpert MRSA Assay (FDA approved  for NASAL specimens only),  is one component of a comprehensive MRSA colonization surveillance program. It is not intended to diagnose MRSA infection nor to guide or monitor treatment for MRSA infections. Test performance is not FDA approved in patients less than 64 years old. Performed at [facility], [place] [address], [place], [place] [address]   Culture, blood (Routine X 2) w Reflex to ID Panel     Status: None   Collection Time: 06/21/22  6:44 AM   Specimen: BLOOD LEFT HAND  Result Value Ref Range Status   Specimen Description   Final    BLOOD LEFT HAND Performed at [facility] [address], [name], [place] [address]    Special Requests   Final    BOTTLES DRAWN AEROBIC AND ANAEROBIC Blood Culture adequate volume Performed at [facility] [address], [place], [place] [address]    Culture   Final    NO GROWTH 5 DAYS Performed at [facility], [place] [address] [place], [place] [address]    Report Status 06/26/2022 FINAL  Final  Culture, blood (Routine X 2) w Reflex to ID Panel     Status: None   Collection Time: 06/21/22  6:53 AM   Specimen: BLOOD RIGHT HAND  Result Value Ref Range Status   Specimen Description   Final    BLOOD RIGHT HAND Performed at [facility] [address], [place], [place] [address]    Special Requests   Final    BOTTLES DRAWN AEROBIC AND ANAEROBIC Blood Culture adequate volume Performed at [facility] [address] [place], [place] [address]    Culture   Final    NO GROWTH 5 DAYS Performed at [facility], [place] [address] [place],  [address]    Report Status 06/26/2022 FINAL  Final    Labs: CBC: Recent Labs  Lab 06/28/22 0354 06/29/22 0537 06/30/22 0426 07/02/22 0200 07/03/22 0306  WBC 5.8 9.4 11.7* 7.5 6.1  NEUTROABS  --   --   --  4.9 4.0  HGB 7.6* 8.1* 9.3* 8.0* 7.4*  HCT 26.8* 28.1* 31.1* 27.6* 25.7*  MCV  89.6 87.8 86.4 88.2 88.9  PLT 68* 94* 168 115* 86*   Basic Metabolic Panel: Recent Labs  Lab 06/27/22 0357 06/28/22 0354 06/29/22 0537 06/29/22 2033 06/30/22 0425 07/01/22 0611 07/02/22 0200 07/03/22 0306  NA 145   < > 144 139 142 135 135 132*  K 3.9   < > 2.8* 2.9* 3.2* 3.2* 3.4* 3.4*  CL 118*   < > 114* 109 114* 110 108 106  CO2 22   < > 21* 23 21* 18* 20* 17*  GLUCOSE 116*   < > 103* 149* 116* 114* 110* 103*  BUN 6   < > 5* <5* <5* <5* <5* <5*  CREATININE 0.35*   < > 0.47 0.53 0.44 0.40* 0.54 0.49  CALCIUM 7.9*   < > 8.1* 8.3* 8.4* 7.8* 8.0* 7.8*  MG 2.3  --  1.5*  --  1.8 1.6* 1.7 1.3*  PHOS 2.5  --   --   --  1.4* 2.7 2.5  --    < > = values in this interval not displayed.   Liver Function Tests: Recent Labs  Lab 06/28/22 0354 06/29/22 0537 06/30/22 0425 07/02/22 0200 07/03/22 0306  AST 41 39 37 37 40  ALT 12 10 10 9 8   ALKPHOS 76 100 99 97 88  BILITOT  2.2* 2.2* 2.2* 1.6* 1.4*  PROT 6.0* 6.8 7.8 7.0 6.6  ALBUMIN 2.8* 3.0* 3.5 3.1* 2.8*   CBG: Recent Labs  Lab 07/02/22 1614 07/02/22 2143 07/03/22 0040 07/03/22 0751 07/03/22 1142  GLUCAP 102* 104* 88 97 95    Discharge time spent: greater than 30 minutes.  Signed: Vernelle Emerald, MD [facility] 07/03/2022

## 2022-07-03 NOTE — TOC Transition Note (Addendum)
Transition of Care [facility]) - CM/SW Discharge Note   Patient Details  Name: Joann [name] MRN: [id number] Date of Birth: 1981-07-01  [facility] CM/SW Contact:  Ross Ludwig, LCSW Phone Number: 07/03/2022, 2:44 PM   Clinical Narrative:     CSW received consult that patient did not have a PCP but does have Medicaid.  CSW was able to get patient an appointment at the [facility] on Thursday November 2nd at 3:45pm.  CSW was informed by bedside nurse that she does not have any other concerns about going home.  Patient has Medicaid so her medications  should be inexpensive.  Patient's daughter will be transporting her back home.  Substance abuse and community resources added to patient's AVS.  CSW signing off, please reconsult if other social work needs arise.   Final next level of care: Home/Self Care Barriers to Discharge: Barriers Resolved   Patient Goals and CMS Choice Patient states their goals for this hospitalization and ongoing recovery are:: To return back home. CMS Medicare.gov Compare Post Acute Care list provided to:: Patient Represenative (must comment) Choice offered to / list presented to : Adult Children  Discharge Placement                       Discharge Plan and Services                                     Social Determinants of Health (SDOH) Interventions     Readmission Risk Interventions    02/20/2022    2:32 PM 02/19/2022   10:32 AM  Readmission Risk Prevention Plan  Transportation Screening    PCP or Specialist Appt within 3-5 Days    HRI or [place] Work Consult for [place] Planning/Counseling    [place] Screening    Medication Review Press photographer)       Information is confidential and restricted. Go to Review Flowsheets to unlock data.

## 2022-07-04 ENCOUNTER — Other Ambulatory Visit (HOSPITAL_COMMUNITY): Payer: Self-pay

## 2022-07-12 ENCOUNTER — Ambulatory Visit (INDEPENDENT_AMBULATORY_CARE_PROVIDER_SITE_OTHER): Payer: Medicaid Other | Admitting: Student

## 2022-07-12 VITALS — BP 100/55 | HR 86 | Temp 97.5°F | Wt 168.7 lb

## 2022-07-12 DIAGNOSIS — I10 Essential (primary) hypertension: Secondary | ICD-10-CM

## 2022-07-12 NOTE — Progress Notes (Signed)
Patient already has a PCP, Dr. Beckie Busing, at [facility]. She has an upcoming appointment on 07/17/2022. Patient thought that this visit was for a specialty service, but would not like to switch primary care providers at this time.

## 2022-07-25 ENCOUNTER — Inpatient Hospital Stay (HOSPITAL_COMMUNITY): Payer: Medicaid Other

## 2022-07-25 ENCOUNTER — Encounter (HOSPITAL_COMMUNITY)
Admission: EM | Disposition: A | Discharge status: Home Health | Payer: Self-pay | Source: Home / Self Care | Attending: Pulmonary Disease

## 2022-07-25 ENCOUNTER — Encounter (HOSPITAL_COMMUNITY): Payer: Self-pay | Admitting: Emergency Medicine

## 2022-07-25 ENCOUNTER — Other Ambulatory Visit: Payer: Self-pay

## 2022-07-25 ENCOUNTER — Emergency Department (HOSPITAL_COMMUNITY): Payer: Medicaid Other

## 2022-07-25 ENCOUNTER — Inpatient Hospital Stay (HOSPITAL_COMMUNITY)
Admission: EM | Admit: 2022-07-25 | Discharge: 2022-08-13 | DRG: 377 | Disposition: A | Discharge status: Home Health | Payer: Medicaid Other | Attending: Internal Medicine | Admitting: Internal Medicine

## 2022-07-25 DIAGNOSIS — Z9911 Dependence on respirator [ventilator] status: Secondary | ICD-10-CM | POA: Diagnosis not present

## 2022-07-25 DIAGNOSIS — I4891 Unspecified atrial fibrillation: Secondary | ICD-10-CM

## 2022-07-25 DIAGNOSIS — K7682 Hepatic encephalopathy: Secondary | ICD-10-CM | POA: Diagnosis not present

## 2022-07-25 DIAGNOSIS — E162 Hypoglycemia, unspecified: Secondary | ICD-10-CM | POA: Diagnosis present

## 2022-07-25 DIAGNOSIS — K766 Portal hypertension: Secondary | ICD-10-CM | POA: Diagnosis present

## 2022-07-25 DIAGNOSIS — K7011 Alcoholic hepatitis with ascites: Secondary | ICD-10-CM | POA: Diagnosis present

## 2022-07-25 DIAGNOSIS — I8501 Esophageal varices with bleeding: Secondary | ICD-10-CM | POA: Diagnosis not present

## 2022-07-25 DIAGNOSIS — K709 Alcoholic liver disease, unspecified: Secondary | ICD-10-CM | POA: Diagnosis not present

## 2022-07-25 DIAGNOSIS — J9601 Acute respiratory failure with hypoxia: Secondary | ICD-10-CM | POA: Diagnosis present

## 2022-07-25 DIAGNOSIS — D696 Thrombocytopenia, unspecified: Secondary | ICD-10-CM | POA: Diagnosis present

## 2022-07-25 DIAGNOSIS — Y900 Blood alcohol level of less than 20 mg/100 ml: Secondary | ICD-10-CM | POA: Diagnosis present

## 2022-07-25 DIAGNOSIS — E878 Other disorders of electrolyte and fluid balance, not elsewhere classified: Secondary | ICD-10-CM | POA: Diagnosis present

## 2022-07-25 DIAGNOSIS — D689 Coagulation defect, unspecified: Secondary | ICD-10-CM | POA: Diagnosis not present

## 2022-07-25 DIAGNOSIS — I8511 Secondary esophageal varices with bleeding: Secondary | ICD-10-CM | POA: Diagnosis present

## 2022-07-25 DIAGNOSIS — R569 Unspecified convulsions: Secondary | ICD-10-CM | POA: Diagnosis present

## 2022-07-25 DIAGNOSIS — R4182 Altered mental status, unspecified: Secondary | ICD-10-CM | POA: Diagnosis not present

## 2022-07-25 DIAGNOSIS — J9811 Atelectasis: Secondary | ICD-10-CM | POA: Diagnosis not present

## 2022-07-25 DIAGNOSIS — R339 Retention of urine, unspecified: Secondary | ICD-10-CM | POA: Diagnosis present

## 2022-07-25 DIAGNOSIS — R739 Hyperglycemia, unspecified: Secondary | ICD-10-CM | POA: Diagnosis not present

## 2022-07-25 DIAGNOSIS — E870 Hyperosmolality and hypernatremia: Secondary | ICD-10-CM | POA: Diagnosis present

## 2022-07-25 DIAGNOSIS — E440 Moderate protein-calorie malnutrition: Secondary | ICD-10-CM | POA: Diagnosis present

## 2022-07-25 DIAGNOSIS — K922 Gastrointestinal hemorrhage, unspecified: Principal | ICD-10-CM

## 2022-07-25 DIAGNOSIS — K920 Hematemesis: Secondary | ICD-10-CM | POA: Diagnosis not present

## 2022-07-25 DIAGNOSIS — F10939 Alcohol use, unspecified with withdrawal, unspecified: Secondary | ICD-10-CM | POA: Diagnosis present

## 2022-07-25 DIAGNOSIS — K449 Diaphragmatic hernia without obstruction or gangrene: Secondary | ICD-10-CM | POA: Diagnosis present

## 2022-07-25 DIAGNOSIS — K7460 Unspecified cirrhosis of liver: Secondary | ICD-10-CM | POA: Diagnosis not present

## 2022-07-25 DIAGNOSIS — Z91040 Latex allergy status: Secondary | ICD-10-CM

## 2022-07-25 DIAGNOSIS — G9341 Metabolic encephalopathy: Secondary | ICD-10-CM | POA: Diagnosis not present

## 2022-07-25 DIAGNOSIS — F10239 Alcohol dependence with withdrawal, unspecified: Secondary | ICD-10-CM | POA: Diagnosis not present

## 2022-07-25 DIAGNOSIS — E861 Hypovolemia: Secondary | ICD-10-CM | POA: Diagnosis present

## 2022-07-25 DIAGNOSIS — K2289 Other specified disease of esophagus: Secondary | ICD-10-CM | POA: Diagnosis not present

## 2022-07-25 DIAGNOSIS — K31811 Angiodysplasia of stomach and duodenum with bleeding: Principal | ICD-10-CM | POA: Diagnosis present

## 2022-07-25 DIAGNOSIS — K5521 Angiodysplasia of colon with hemorrhage: Secondary | ICD-10-CM | POA: Diagnosis present

## 2022-07-25 DIAGNOSIS — F05 Delirium due to known physiological condition: Secondary | ICD-10-CM | POA: Diagnosis not present

## 2022-07-25 DIAGNOSIS — F1010 Alcohol abuse, uncomplicated: Secondary | ICD-10-CM | POA: Diagnosis present

## 2022-07-25 DIAGNOSIS — R579 Shock, unspecified: Secondary | ICD-10-CM | POA: Diagnosis not present

## 2022-07-25 DIAGNOSIS — I480 Paroxysmal atrial fibrillation: Secondary | ICD-10-CM | POA: Diagnosis not present

## 2022-07-25 DIAGNOSIS — E871 Hypo-osmolality and hyponatremia: Secondary | ICD-10-CM | POA: Diagnosis present

## 2022-07-25 DIAGNOSIS — E876 Hypokalemia: Secondary | ICD-10-CM | POA: Diagnosis present

## 2022-07-25 DIAGNOSIS — K3189 Other diseases of stomach and duodenum: Secondary | ICD-10-CM | POA: Diagnosis present

## 2022-07-25 DIAGNOSIS — R401 Stupor: Secondary | ICD-10-CM | POA: Diagnosis not present

## 2022-07-25 DIAGNOSIS — D684 Acquired coagulation factor deficiency: Secondary | ICD-10-CM | POA: Diagnosis present

## 2022-07-25 DIAGNOSIS — D62 Acute posthemorrhagic anemia: Secondary | ICD-10-CM | POA: Diagnosis present

## 2022-07-25 DIAGNOSIS — E7220 Disorder of urea cycle metabolism, unspecified: Secondary | ICD-10-CM

## 2022-07-25 DIAGNOSIS — Z79899 Other long term (current) drug therapy: Secondary | ICD-10-CM

## 2022-07-25 DIAGNOSIS — K7031 Alcoholic cirrhosis of liver with ascites: Secondary | ICD-10-CM | POA: Diagnosis present

## 2022-07-25 DIAGNOSIS — Z833 Family history of diabetes mellitus: Secondary | ICD-10-CM

## 2022-07-25 DIAGNOSIS — K7030 Alcoholic cirrhosis of liver without ascites: Secondary | ICD-10-CM | POA: Diagnosis not present

## 2022-07-25 DIAGNOSIS — F419 Anxiety disorder, unspecified: Secondary | ICD-10-CM | POA: Diagnosis present

## 2022-07-25 DIAGNOSIS — R7989 Other specified abnormal findings of blood chemistry: Secondary | ICD-10-CM | POA: Diagnosis not present

## 2022-07-25 DIAGNOSIS — E8721 Acute metabolic acidosis: Secondary | ICD-10-CM | POA: Diagnosis present

## 2022-07-25 DIAGNOSIS — I10 Essential (primary) hypertension: Secondary | ICD-10-CM | POA: Diagnosis present

## 2022-07-25 DIAGNOSIS — I8510 Secondary esophageal varices without bleeding: Secondary | ICD-10-CM

## 2022-07-25 DIAGNOSIS — K7290 Hepatic failure, unspecified without coma: Secondary | ICD-10-CM | POA: Diagnosis not present

## 2022-07-25 DIAGNOSIS — R410 Disorientation, unspecified: Secondary | ICD-10-CM | POA: Diagnosis not present

## 2022-07-25 DIAGNOSIS — Z8249 Family history of ischemic heart disease and other diseases of the circulatory system: Secondary | ICD-10-CM

## 2022-07-25 DIAGNOSIS — Z6824 Body mass index (BMI) 24.0-24.9, adult: Secondary | ICD-10-CM

## 2022-07-25 DIAGNOSIS — Z811 Family history of alcohol abuse and dependence: Secondary | ICD-10-CM

## 2022-07-25 HISTORY — PX: ESOPHAGEAL BANDING: SHX5518

## 2022-07-25 HISTORY — PX: ESOPHAGOGASTRODUODENOSCOPY (EGD) WITH PROPOFOL: SHX5813

## 2022-07-25 HISTORY — PX: HEMOSTASIS CLIP PLACEMENT: SHX6857

## 2022-07-25 HISTORY — PX: HOT HEMOSTASIS: SHX5433

## 2022-07-25 LAB — GLUCOSE, CAPILLARY
Glucose-Capillary: 77 mg/dL (ref 70–99)
Glucose-Capillary: 114 mg/dL — ABNORMAL HIGH (ref 70–99)
Glucose-Capillary: 83 mg/dL (ref 70–99)

## 2022-07-25 LAB — CBC
HCT: 32.8 % — ABNORMAL LOW (ref 36.0–46.0)
HCT: 30.3 % — ABNORMAL LOW (ref 36.0–46.0)
Hemoglobin: 9.6 g/dL — ABNORMAL LOW (ref 12.0–15.0)
Hemoglobin: 9.1 g/dL — ABNORMAL LOW (ref 12.0–15.0)
MCH: 26.9 pg (ref 26.0–34.0)
MCH: 27.4 pg (ref 26.0–34.0)
MCHC: 29.3 g/dL — ABNORMAL LOW (ref 30.0–36.0)
MCHC: 30 g/dL (ref 30.0–36.0)
MCV: 91.9 fL (ref 80.0–100.0)
MCV: 91.3 fL (ref 80.0–100.0)
Platelets: 38 10*3/uL — ABNORMAL LOW (ref 150–400)
Platelets: 37 10*3/uL — ABNORMAL LOW (ref 150–400)
RBC: 3.57 MIL/uL — ABNORMAL LOW (ref 3.87–5.11)
RBC: 3.32 MIL/uL — ABNORMAL LOW (ref 3.87–5.11)
RDW: 21.1 % — ABNORMAL HIGH (ref 11.5–15.5)
RDW: 21.1 % — ABNORMAL HIGH (ref 11.5–15.5)
WBC: 5.8 10*3/uL (ref 4.0–10.5)
WBC: 6.7 10*3/uL (ref 4.0–10.5)
nRBC: 0.5 % — ABNORMAL HIGH (ref 0.0–0.2)
nRBC: 0 % (ref 0.0–0.2)

## 2022-07-25 LAB — CBC WITH DIFFERENTIAL/PLATELET
Abs Immature Granulocytes: 0.03 10*3/uL (ref 0.00–0.07)
Basophils Absolute: 0 10*3/uL (ref 0.0–0.1)
Basophils Relative: 0 %
Eosinophils Absolute: 0 10*3/uL (ref 0.0–0.5)
Eosinophils Relative: 0 %
HCT: 33.7 % — ABNORMAL LOW (ref 36.0–46.0)
Hemoglobin: 9.7 g/dL — ABNORMAL LOW (ref 12.0–15.0)
Immature Granulocytes: 1 %
Lymphocytes Relative: 39 %
Lymphs Abs: 2.6 10*3/uL (ref 0.7–4.0)
MCH: 25.3 pg — ABNORMAL LOW (ref 26.0–34.0)
MCHC: 28.8 g/dL — ABNORMAL LOW (ref 30.0–36.0)
MCV: 88 fL (ref 80.0–100.0)
Monocytes Absolute: 0.2 10*3/uL (ref 0.1–1.0)
Monocytes Relative: 4 %
Neutro Abs: 3.8 10*3/uL (ref 1.7–7.7)
Neutrophils Relative %: 56 %
Platelets: 76 10*3/uL — ABNORMAL LOW (ref 150–400)
RBC: 3.83 MIL/uL — ABNORMAL LOW (ref 3.87–5.11)
RDW: 23.6 % — ABNORMAL HIGH (ref 11.5–15.5)
WBC: 6.7 10*3/uL (ref 4.0–10.5)
nRBC: 0 % (ref 0.0–0.2)

## 2022-07-25 LAB — I-STAT CHEM 8, ED
BUN: <3 mg/dL — ABNORMAL LOW (ref 6–20)
Calcium, Ion: 0.82 mmol/L — CL (ref 1.15–1.40)
Chloride: 109 mmol/L (ref 98–111)
Creatinine, Ser: 0.8 mg/dL (ref 0.44–1.00)
Glucose, Bld: 127 mg/dL — ABNORMAL HIGH (ref 70–99)
HCT: 35 % — ABNORMAL LOW (ref 36.0–46.0)
Hemoglobin: 11.9 g/dL — ABNORMAL LOW (ref 12.0–15.0)
Potassium: 5 mmol/L (ref 3.5–5.1)
Sodium: 148 mmol/L — ABNORMAL HIGH (ref 135–145)
TCO2: 21 mmol/L — ABNORMAL LOW (ref 22–32)

## 2022-07-25 LAB — BASIC METABOLIC PANEL
Anion gap: 19 — ABNORMAL HIGH (ref 5–15)
BUN: 5 mg/dL — ABNORMAL LOW (ref 6–20)
CO2: 18 mmol/L — ABNORMAL LOW (ref 22–32)
Calcium: 7.7 mg/dL — ABNORMAL LOW (ref 8.9–10.3)
Chloride: 110 mmol/L (ref 98–111)
Creatinine, Ser: 0.58 mg/dL (ref 0.44–1.00)
GFR, Estimated: >60 mL/min (ref >60)
Glucose, Bld: 87 mg/dL (ref 70–99)
Potassium: 3.6 mmol/L (ref 3.5–5.1)
Sodium: 147 mmol/L — ABNORMAL HIGH (ref 135–145)

## 2022-07-25 LAB — BLOOD GAS, ARTERIAL
Acid-base deficit: 10.6 mmol/L — ABNORMAL HIGH (ref 0.0–2.0)
Bicarbonate: 14.9 mmol/L — ABNORMAL LOW (ref 20.0–28.0)
O2 Saturation: 99.4 %
Patient temperature: 37
pCO2 arterial: 31 mmHg — ABNORMAL LOW (ref 32–48)
pH, Arterial: 7.29 — ABNORMAL LOW (ref 7.35–7.45)
pO2, Arterial: 130 mmHg — ABNORMAL HIGH (ref 83–108)

## 2022-07-25 LAB — AMMONIA: Ammonia: 140 umol/L — ABNORMAL HIGH (ref 9–35)

## 2022-07-25 LAB — PROTIME-INR
INR: 1.6 — ABNORMAL HIGH (ref 0.8–1.2)
INR: 1.7 — ABNORMAL HIGH (ref 0.8–1.2)
Prothrombin Time: 19.1 seconds — ABNORMAL HIGH (ref 11.4–15.2)
Prothrombin Time: 19.4 seconds — ABNORMAL HIGH (ref 11.4–15.2)

## 2022-07-25 LAB — FIBRINOGEN: Fibrinogen: 215 mg/dL (ref 210–475)

## 2022-07-25 LAB — COMPREHENSIVE METABOLIC PANEL
ALT: 14 U/L (ref 0–44)
AST: 157 U/L — ABNORMAL HIGH (ref 15–41)
Albumin: 3.4 g/dL — ABNORMAL LOW (ref 3.5–5.0)
Alkaline Phosphatase: 243 U/L — ABNORMAL HIGH (ref 38–126)
Anion gap: 18 — ABNORMAL HIGH (ref 5–15)
BUN: 5 mg/dL — ABNORMAL LOW (ref 6–20)
CO2: 20 mmol/L — ABNORMAL LOW (ref 22–32)
Calcium: 7.9 mg/dL — ABNORMAL LOW (ref 8.9–10.3)
Chloride: 107 mmol/L (ref 98–111)
Creatinine, Ser: 0.63 mg/dL (ref 0.44–1.00)
GFR, Estimated: >60 mL/min (ref >60)
Glucose, Bld: 122 mg/dL — ABNORMAL HIGH (ref 70–99)
Potassium: 2.8 mmol/L — ABNORMAL LOW (ref 3.5–5.1)
Sodium: 145 mmol/L (ref 135–145)
Total Bilirubin: 3.3 mg/dL — ABNORMAL HIGH (ref 0.3–1.2)
Total Protein: 10.2 g/dL — ABNORMAL HIGH (ref 6.5–8.1)

## 2022-07-25 LAB — MRSA NEXT GEN BY PCR, NASAL: MRSA by PCR Next Gen: NOT DETECTED

## 2022-07-25 LAB — I-STAT BETA HCG BLOOD, ED (MC, WL, AP ONLY): I-stat hCG, quantitative: <5 m[IU]/mL (ref <5)

## 2022-07-25 LAB — ETHANOL

## 2022-07-25 LAB — PREPARE RBC (CROSSMATCH)

## 2022-07-25 SURGERY — ESOPHAGOGASTRODUODENOSCOPY (EGD) WITH PROPOFOL
Anesthesia: Moderate Sedation

## 2022-07-25 SURGERY — ESOPHAGOGASTRODUODENOSCOPY (EGD) WITH PROPOFOL
Anesthesia: Monitor Anesthesia Care

## 2022-07-25 MED ORDER — PROPOFOL 1000 MG/100ML IV EMUL
5.0000 ug/kg/min | INTRAVENOUS | Status: DC
  Administered 2022-07-25: 80 ug/kg/min via INTRAVENOUS
  Administered 2022-07-25: 35 ug/kg/min via INTRAVENOUS
  Administered 2022-07-25 – 2022-07-26 (×4): 80 ug/kg/min via INTRAVENOUS
  Administered 2022-07-26: 30 ug/kg/min via INTRAVENOUS
  Administered 2022-07-26: 20 ug/kg/min via INTRAVENOUS
  Administered 2022-07-27: 30 ug/kg/min via INTRAVENOUS
  Administered 2022-07-27: 25 ug/kg/min via INTRAVENOUS
  Administered 2022-07-28: 15 ug/kg/min via INTRAVENOUS
  Filled 2022-07-25 (×11): qty 100

## 2022-07-25 MED ORDER — PHENOBARBITAL SODIUM 130 MG/ML IJ SOLN: 65.0000 mg | Freq: Three times a day (TID) | INTRAMUSCULAR | Status: DC

## 2022-07-25 MED ORDER — PANTOPRAZOLE INFUSION (NEW) - SIMPLE MED
8.0000 mg/h | INTRAVENOUS | Status: AC
  Administered 2022-07-25 – 2022-07-27 (×7): 8 mg/h via INTRAVENOUS
  Filled 2022-07-25 (×2): qty 100
  Filled 2022-07-25 (×5): qty 80
  Filled 2022-07-25 (×2): qty 100
  Filled 2022-07-25 (×2): qty 80

## 2022-07-25 MED ORDER — FENTANYL CITRATE PF 50 MCG/ML IJ SOSY: 50.0000 ug | PREFILLED_SYRINGE | INTRAMUSCULAR | Status: DC | PRN

## 2022-07-25 MED ORDER — ORAL CARE MOUTH RINSE: 15.0000 mL | OROMUCOSAL | Status: DC | PRN

## 2022-07-25 MED ORDER — LACTATED RINGERS IV BOLUS
1000.0000 mL | Freq: Once | INTRAVENOUS | Status: AC
  Administered 2022-07-25: 1000 mL via INTRAVENOUS

## 2022-07-25 MED ORDER — PHENOBARBITAL SODIUM 130 MG/ML IJ SOLN
97.5000 mg | Freq: Three times a day (TID) | INTRAMUSCULAR | Status: AC
  Administered 2022-07-25 – 2022-07-26 (×3): 97.5 mg via INTRAVENOUS
  Filled 2022-07-25 (×2): qty 1

## 2022-07-25 MED ORDER — MIDAZOLAM HCL 2 MG/2ML IJ SOLN
INTRAMUSCULAR | Status: AC
  Filled 2022-07-25: qty 2

## 2022-07-25 MED ORDER — POTASSIUM CHLORIDE 10 MEQ/100ML IV SOLN
10.0000 meq | INTRAVENOUS | Status: AC
  Administered 2022-07-25 (×4): 10 meq via INTRAVENOUS
  Filled 2022-07-25 (×4): qty 100

## 2022-07-25 MED ORDER — FOLIC ACID 5 MG/ML IJ SOLN
1.0000 mg | Freq: Every day | INTRAMUSCULAR | Status: DC
  Administered 2022-07-25 – 2022-08-02 (×9): 1 mg via INTRAVENOUS
  Filled 2022-07-25 (×10): qty 0.2

## 2022-07-25 MED ORDER — ORAL CARE MOUTH RINSE
15.0000 mL | OROMUCOSAL | Status: DC
  Administered 2022-07-25 – 2022-07-26 (×4): 15 mL via OROMUCOSAL

## 2022-07-25 MED ORDER — PANTOPRAZOLE SODIUM 40 MG IV SOLR
40.0000 mg | Freq: Once | INTRAVENOUS | Status: AC
  Administered 2022-07-25: 40 mg via INTRAVENOUS
  Filled 2022-07-25: qty 10

## 2022-07-25 MED ORDER — SODIUM CHLORIDE 0.9 % IV SOLN: Freq: Once | INTRAVENOUS | Status: AC

## 2022-07-25 MED ORDER — DOCUSATE SODIUM 50 MG/5ML PO LIQD: 100.0000 mg | Freq: Two times a day (BID) | ORAL | Status: DC

## 2022-07-25 MED ORDER — METOPROLOL TARTRATE 5 MG/5ML IV SOLN: 2.5000 mg | Freq: Once | INTRAVENOUS | Status: AC

## 2022-07-25 MED ORDER — OCTREOTIDE LOAD VIA INFUSION
50.0000 ug | Freq: Once | INTRAVENOUS | Status: AC
  Administered 2022-07-25: 50 ug via INTRAVENOUS
  Filled 2022-07-25: qty 25

## 2022-07-25 MED ORDER — SODIUM CHLORIDE 0.9 % IV SOLN
50.0000 ug/h | INTRAVENOUS | Status: AC
  Administered 2022-07-25 – 2022-07-28 (×8): 50 ug/h via INTRAVENOUS
  Filled 2022-07-25 (×9): qty 1

## 2022-07-25 MED ORDER — FENTANYL CITRATE PF 50 MCG/ML IJ SOSY
50.0000 ug | PREFILLED_SYRINGE | INTRAMUSCULAR | Status: DC | PRN
  Administered 2022-07-25: 100 ug via INTRAVENOUS
  Administered 2022-07-25: 50 ug via INTRAVENOUS
  Administered 2022-07-25 (×2): 100 ug via INTRAVENOUS
  Administered 2022-07-25: 175 ug via INTRAVENOUS
  Administered 2022-07-26: 100 ug via INTRAVENOUS
  Administered 2022-07-26 (×3): 200 ug via INTRAVENOUS
  Administered 2022-07-26: 100 ug via INTRAVENOUS
  Administered 2022-07-26 – 2022-07-27 (×3): 200 ug via INTRAVENOUS
  Administered 2022-07-27: 100 ug via INTRAVENOUS
  Administered 2022-07-27: 150 ug via INTRAVENOUS
  Filled 2022-07-25 (×2): qty 2

## 2022-07-25 MED ORDER — THIAMINE HCL 100 MG/ML IJ SOLN
100.0000 mg | Freq: Every day | INTRAMUSCULAR | Status: DC
  Administered 2022-07-25: 100 mg via INTRAVENOUS
  Filled 2022-07-25 (×2): qty 2

## 2022-07-25 MED ORDER — PANTOPRAZOLE SODIUM 40 MG IV SOLR
40.0000 mg | Freq: Two times a day (BID) | INTRAVENOUS | Status: DC
  Administered 2022-07-28 – 2022-08-02 (×10): 40 mg via INTRAVENOUS
  Filled 2022-07-25 (×10): qty 10

## 2022-07-25 MED ORDER — ROCURONIUM BROMIDE 50 MG/5ML IV SOLN
1.0000 mg/kg | Freq: Once | INTRAVENOUS | Status: AC
  Filled 2022-07-25: qty 6.35

## 2022-07-25 MED ORDER — CHLORHEXIDINE GLUCONATE CLOTH 2 % EX PADS
6.0000 | MEDICATED_PAD | Freq: Every day | CUTANEOUS | Status: DC
  Administered 2022-07-25 – 2022-08-13 (×20): 6 via TOPICAL

## 2022-07-25 MED ORDER — LACTULOSE ENEMA
300.0000 mL | Freq: Four times a day (QID) | ORAL | Status: DC
  Administered 2022-07-25 – 2022-07-26 (×2): 300 mL via RECTAL
  Filled 2022-07-25 (×5): qty 300

## 2022-07-25 MED ORDER — FENTANYL CITRATE PF 50 MCG/ML IJ SOSY: 50.0000 ug | PREFILLED_SYRINGE | Freq: Once | INTRAMUSCULAR | Status: AC

## 2022-07-25 MED ORDER — MIDAZOLAM HCL 2 MG/2ML IJ SOLN
INTRAMUSCULAR | Status: AC
  Administered 2022-07-25: 4 mg via INTRAVENOUS
  Filled 2022-07-25: qty 2

## 2022-07-25 MED ORDER — PANTOPRAZOLE SODIUM 40 MG IV SOLR: 40.0000 mg | Freq: Once | INTRAVENOUS | Status: DC

## 2022-07-25 MED ORDER — SODIUM CHLORIDE 0.9 % IV SOLN
1.0000 g | Freq: Once | INTRAVENOUS | Status: AC
  Administered 2022-07-25: 1 g via INTRAVENOUS
  Filled 2022-07-25: qty 10

## 2022-07-25 MED ORDER — ORAL CARE MOUTH RINSE: 15.0000 mL | OROMUCOSAL | Status: DC

## 2022-07-25 MED ORDER — LORAZEPAM 2 MG/ML IJ SOLN
0.5000 mg | Freq: Once | INTRAMUSCULAR | Status: AC
  Administered 2022-07-25: 0.5 mg via INTRAVENOUS
  Filled 2022-07-25: qty 1

## 2022-07-25 MED ORDER — MAGNESIUM SULFATE 2 GM/50ML IV SOLN
2.0000 g | Freq: Once | INTRAVENOUS | Status: AC
  Administered 2022-07-25: 2 g via INTRAVENOUS
  Filled 2022-07-25: qty 50

## 2022-07-25 MED ORDER — PHENOBARBITAL SODIUM 65 MG/ML IJ SOLN
65.0000 mg | Freq: Three times a day (TID) | INTRAMUSCULAR | Status: DC
  Administered 2022-07-27 – 2022-07-28 (×3): 65 mg via INTRAVENOUS
  Filled 2022-07-25 (×4): qty 1

## 2022-07-25 MED ORDER — CALCIUM GLUCONATE-NACL 2-0.675 GM/100ML-% IV SOLN
2.0000 g | Freq: Once | INTRAVENOUS | Status: AC
  Administered 2022-07-25: 2000 mg via INTRAVENOUS
  Filled 2022-07-25: qty 100

## 2022-07-25 MED ORDER — ETOMIDATE 2 MG/ML IV SOLN: 20.0000 mg | Freq: Once | INTRAVENOUS | Status: AC

## 2022-07-25 MED ORDER — FENTANYL CITRATE (PF) 100 MCG/2ML IJ SOLN
INTRAMUSCULAR | Status: AC
  Administered 2022-07-25: 100 ug via INTRAVENOUS
  Filled 2022-07-25: qty 2

## 2022-07-25 MED ORDER — FENTANYL BOLUS VIA INFUSION
50.0000 ug | INTRAVENOUS | Status: DC | PRN
  Administered 2022-07-25 – 2022-07-26 (×2): 100 ug via INTRAVENOUS

## 2022-07-25 MED ORDER — METOPROLOL TARTRATE 5 MG/5ML IV SOLN
INTRAVENOUS | Status: AC
  Administered 2022-07-25: 2.5 mg via INTRAVENOUS
  Filled 2022-07-25: qty 5

## 2022-07-25 MED ORDER — PHENYLEPHRINE 80 MCG/ML (10ML) SYRINGE FOR IV PUSH (FOR BLOOD PRESSURE SUPPORT)
PREFILLED_SYRINGE | INTRAVENOUS | Status: AC
  Filled 2022-07-25: qty 10

## 2022-07-25 MED ORDER — ONDANSETRON HCL 4 MG/2ML IJ SOLN
4.0000 mg | Freq: Once | INTRAMUSCULAR | Status: AC
  Administered 2022-07-25: 4 mg via INTRAVENOUS
  Filled 2022-07-25: qty 2

## 2022-07-25 MED ORDER — LACTULOSE ENEMA
300.0000 mL | Freq: Four times a day (QID) | ORAL | Status: DC
  Filled 2022-07-25 (×2): qty 300

## 2022-07-25 MED ORDER — PHENOBARBITAL SODIUM 65 MG/ML IJ SOLN: 32.5000 mg | Freq: Three times a day (TID) | INTRAMUSCULAR | Status: DC

## 2022-07-25 MED ORDER — SODIUM CHLORIDE 0.9% IV SOLUTION: Freq: Once | INTRAVENOUS | Status: DC

## 2022-07-25 MED ORDER — LACTULOSE ENEMA
300.0000 mL | ORAL | Status: DC
  Filled 2022-07-25 (×2): qty 300

## 2022-07-25 MED ORDER — ETOMIDATE 2 MG/ML IV SOLN
INTRAVENOUS | Status: AC
  Administered 2022-07-25: 20 mg via INTRAVENOUS
  Filled 2022-07-25: qty 20

## 2022-07-25 MED ORDER — ROCURONIUM BROMIDE 10 MG/ML (PF) SYRINGE
PREFILLED_SYRINGE | INTRAVENOUS | Status: AC
  Administered 2022-07-25: 63.5 mg via INTRAVENOUS
  Filled 2022-07-25: qty 10

## 2022-07-25 MED ORDER — ORAL CARE MOUTH RINSE
15.0000 mL | OROMUCOSAL | Status: DC | PRN
  Administered 2022-08-08: 15 mL via OROMUCOSAL

## 2022-07-25 MED ORDER — OCTREOTIDE ACETATE 100 MCG/ML IJ SOLN
50.0000 ug | Freq: Once | INTRAMUSCULAR | Status: DC
  Filled 2022-07-25: qty 0.5

## 2022-07-25 MED ORDER — FAMOTIDINE 20 MG PO TABS: 20.0000 mg | ORAL_TABLET | Freq: Two times a day (BID) | ORAL | Status: DC

## 2022-07-25 MED ORDER — OCTREOTIDE LOAD VIA INFUSION: 50.0000 ug | Freq: Once | INTRAVENOUS | Status: DC

## 2022-07-25 MED ORDER — POLYETHYLENE GLYCOL 3350 17 G PO PACK: 17.0000 g | PACK | Freq: Every day | ORAL | Status: DC

## 2022-07-25 MED ORDER — FENTANYL CITRATE PF 50 MCG/ML IJ SOSY
PREFILLED_SYRINGE | INTRAMUSCULAR | Status: AC
  Administered 2022-07-25: 100 ug via INTRAVENOUS
  Filled 2022-07-25: qty 2

## 2022-07-25 MED ORDER — LORAZEPAM 2 MG/ML IJ SOLN
2.0000 mg | INTRAMUSCULAR | Status: DC | PRN
  Filled 2022-07-25: qty 1

## 2022-07-25 MED ORDER — LORAZEPAM 2 MG/ML IJ SOLN: 0.0000 mg | Freq: Three times a day (TID) | INTRAMUSCULAR | Status: DC | PRN

## 2022-07-25 MED ORDER — LORAZEPAM 2 MG/ML IJ SOLN
2.0000 mg | INTRAMUSCULAR | Status: DC | PRN
  Administered 2022-07-25 – 2022-08-05 (×15): 2 mg via INTRAVENOUS
  Filled 2022-07-25 (×15): qty 1

## 2022-07-25 MED ORDER — SODIUM CHLORIDE 0.9 % IV SOLN
2.0000 g | INTRAVENOUS | Status: DC
  Administered 2022-07-26 – 2022-07-28 (×3): 2 g via INTRAVENOUS
  Filled 2022-07-25 (×3): qty 20

## 2022-07-25 MED ORDER — METOCLOPRAMIDE HCL 5 MG/ML IJ SOLN
10.0000 mg | Freq: Once | INTRAMUSCULAR | Status: AC
  Administered 2022-07-25: 10 mg via INTRAVENOUS
  Filled 2022-07-25: qty 2

## 2022-07-25 MED ORDER — SUCCINYLCHOLINE CHLORIDE 200 MG/10ML IV SOSY
PREFILLED_SYRINGE | INTRAVENOUS | Status: AC
  Filled 2022-07-25: qty 10

## 2022-07-25 MED ORDER — LORAZEPAM 2 MG/ML IJ SOLN
0.0000 mg | INTRAMUSCULAR | Status: DC | PRN
  Administered 2022-07-25: 2 mg via INTRAVENOUS
  Filled 2022-07-25: qty 1

## 2022-07-25 MED ORDER — TRANEXAMIC ACID-NACL 1000-0.7 MG/100ML-% IV SOLN
1000.0000 mg | Freq: Once | INTRAVENOUS | Status: AC
  Administered 2022-07-25: 1000 mg via INTRAVENOUS
  Filled 2022-07-25: qty 100

## 2022-07-25 MED ORDER — MIDAZOLAM HCL 2 MG/2ML IJ SOLN: 4.0000 mg | Freq: Once | INTRAMUSCULAR | Status: AC

## 2022-07-25 MED ORDER — PHENOBARBITAL SODIUM 130 MG/ML IJ SOLN
97.5000 mg | Freq: Three times a day (TID) | INTRAMUSCULAR | Status: DC
  Filled 2022-07-25: qty 1

## 2022-07-25 MED ORDER — FENTANYL 2500MCG IN NS 250ML (10MCG/ML) PREMIX INFUSION
0.0000 ug/h | INTRAVENOUS | Status: DC
  Administered 2022-07-25: 100 ug/h via INTRAVENOUS
  Administered 2022-07-26: 150 ug/h via INTRAVENOUS
  Administered 2022-07-26: 200 ug/h via INTRAVENOUS
  Administered 2022-07-26: 150 ug/h via INTRAVENOUS
  Administered 2022-07-27: 200 ug/h via INTRAVENOUS
  Administered 2022-07-27: 150 ug/h via INTRAVENOUS
  Filled 2022-07-25 (×6): qty 250

## 2022-07-25 MED ORDER — FENTANYL CITRATE (PF) 100 MCG/2ML IJ SOLN: 100.0000 ug | Freq: Once | INTRAMUSCULAR | Status: AC

## 2022-07-25 SURGICAL SUPPLY — 15 items

## 2022-07-25 NOTE — Procedures (Signed)
Intubation Procedure Note  Joann [name]  [id number]  01-26-1981  Date:07/25/22  Time:2:24 PM   Provider Performing:Joann [name]    Procedure: Intubation (95284)  Indication(s) Respiratory Failure  Consent Risks of the procedure as well as the alternatives and risks of each were explained to the patient and/or caregiver.  Consent for the procedure was obtained and is signed in the bedside chart   Anesthesia Etomidate, Versed, Fentanyl, and Rocuronium   Time Out Verified patient identification, verified procedure, site/side was marked, verified correct patient position, special equipment/implants available, medications/allergies/relevant history reviewed, required imaging and test results available.   Sterile Technique Usual hand hygeine, masks, and gloves were used   Procedure Description Patient positioned in bed supine.  Sedation given as noted above.  Patient was intubated with endotracheal tube using Glidescope.  View was Grade 1 full glottis .  Number of attempts was 1.  Colorimetric CO2 detector was consistent with tracheal placement.   Complications/Tolerance None; patient tolerated the procedure well. Chest X-ray is ordered to verify placement.   EBL 0   Specimen(s) None  Julian Hy, DO 07/25/22 2:25 PM [facility]

## 2022-07-25 NOTE — ED Provider Notes (Signed)
Joann [name]   CSN: [id number] Arrival date & time: 07/25/22  2025     History  Chief Complaint  Patient presents with   Hematemesis    Joann [name] is a 41 y.o. female.  The history is provided by the patient and medical records.  Joann [name] is a 41 y.o. female who presents to the Emergency Department complaining of hematemesis.  She presents to the emergency department by EMS for evaluation of hematemesis that started at 10 AM on Tuesday.  She reports too numerous to count episodes of bright red blood in her vomit.  She also reports intermittent left-sided epistaxis.  She has been experiencing dark stools for the last several days, no BM today.  She also reports several days of right upper quadrant pain.  She has a history of alcoholic cirrhosis with esophageal varices.  She states that she drinks about a gallon of vodka a day and her last drink was Tuesday morning.  No associated fevers.  No dysuria.  She states that she is compliant with her medications and has been taking her lactulose.  She states that she is trying to get better.  She sees [facility].     Home Medications Prior to Admission medications   Medication Sig Start Date End Date Taking? Authorizing Provider  carvedilol (COREG) 3.125 MG tablet Take 1 tablet (3.125 mg total) by mouth 2 (two) times daily with a meal. 07/03/22   Shalhoub, Sherryll Burger, MD  ferrous sulfate 325 (65 FE) MG tablet Take 1 tablet (325 mg total) by mouth daily before breakfast. Take with a cup of juice Patient taking differently: Take 325 mg by mouth daily before breakfast. 07/03/22 10/01/22  Shalhoub, Sherryll Burger, MD  folic acid (FOLVITE) 1 MG tablet Take 1 tablet (1 mg total) by mouth daily. 07/03/22   Shalhoub, Sherryll Burger, MD  Multiple Vitamin (MULTIVITAMIN WITH MINERALS) TABS tablet Take 1 tablet by mouth daily. 01/15/20   Angiulli, Lavon Paganini, PA-C  pantoprazole (PROTONIX) 40 MG tablet Take 1 tablet (40 mg  total) by mouth 2 (two) times daily. 07/03/22   Shalhoub, Sherryll Burger, MD  phytonadione (VITAMIN K) 5 MG tablet Take 2 tablets (10 mg total) by mouth daily. Patient not taking: Reported on 06/21/2022 02/26/22   Mansouraty, Telford Nab., MD  rifaximin (XIFAXAN) 550 MG TABS tablet Take 1 tablet (550 mg total) by mouth 2 (two) times daily. 07/03/22   Shalhoub, Sherryll Burger, MD  sucralfate (CARAFATE) 1 g tablet Take 1 tablet (1 g total) by mouth 4 (four) times daily -  with meals and at bedtime. 07/03/22   Shalhoub, Sherryll Burger, MD  thiamine (VITAMIN B-1) 100 MG tablet Take 1 tablet (100 mg total) by mouth daily. 07/04/22   Shalhoub, Sherryll Burger, MD  traZODone (DESYREL) 150 MG tablet Take 150 mg by mouth at bedtime. 01/11/22   [provider]      Allergies    Latex    Review of Systems   Review of Systems  All other systems reviewed and are negative.   Physical Exam Updated Vital Signs BP 121/77   Pulse (!) 115   Temp 98.3 F (36.8 C) (Oral)   Resp 16   Ht 5' 4"  (1.626 m)   Wt 63.5 kg   SpO2 97%   BMI 24.03 kg/m  Physical Exam Vitals and nursing [name] reviewed.  Constitutional:      General: She is in acute distress.  Appearance: She is well-developed.  HENT:     Head: Normocephalic and atraumatic.     Comments: No gross blood in the naris bilaterally.  No blood in the posterior oropharynx Eyes:     General: Scleral icterus present.  Cardiovascular:     Rate and Rhythm: Regular rhythm. Tachycardia present.     Heart sounds: No murmur heard. Pulmonary:     Effort: Pulmonary effort is normal. No respiratory distress.     Breath sounds: Normal breath sounds.  Abdominal:     Palpations: Abdomen is soft.     Tenderness: There is no abdominal tenderness. There is no guarding or rebound.  Musculoskeletal:        General: No tenderness.  Skin:    General: Skin is warm and dry.  Neurological:     Mental Status: She is alert and oriented to person, place, and time.  Psychiatric:         Behavior: Behavior normal.     ED Results / Procedures / Treatments   Labs (all labs ordered are listed, but only abnormal results are displayed) Labs Reviewed  COMPREHENSIVE METABOLIC PANEL - Abnormal; Notable for the following components:      Result Value   Potassium 2.8 (*)    CO2 20 (*)    Glucose, Bld 122 (*)    BUN 5 (*)    Calcium 7.9 (*)    Total Protein 10.2 (*)    Albumin 3.4 (*)    AST 157 (*)    Alkaline Phosphatase 243 (*)    Total Bilirubin 3.3 (*)    Anion gap 18 (*)    All other components within normal limits  CBC WITH DIFFERENTIAL/PLATELET - Abnormal; Notable for the following components:   RBC 3.83 (*)    Hemoglobin 9.7 (*)    HCT 33.7 (*)    MCH 25.3 (*)    MCHC 28.8 (*)    RDW 23.6 (*)    Platelets 76 (*)    All other components within normal limits  PROTIME-INR - Abnormal; Notable for the following components:   Prothrombin Time 19.1 (*)    INR 1.6 (*)    All other components within normal limits  I-STAT CHEM 8, ED - Abnormal; Notable for the following components:   Sodium 148 (*)    BUN <3 (*)    Glucose, Bld 127 (*)    Calcium, Ion 0.82 (*)    TCO2 21 (*)    Hemoglobin 11.9 (*)    HCT 35.0 (*)    All other components within normal limits  AMMONIA  ETHANOL  AMMONIA  I-STAT BETA HCG BLOOD, ED ([facility], [facility], [facility] ONLY)  TYPE AND SCREEN  PREPARE RBC (CROSSMATCH)  PREPARE FRESH FROZEN PLASMA    EKG EKG Interpretation  Date/Time:  Wednesday July 25 2022 05:41:07 EST Ventricular Rate:  119 PR Interval:  129 QRS Duration: 97 QT Interval:  326 QTC Calculation: 459 R Axis:   46 Text Interpretation: Sinus tachycardia Atrial premature complex Borderline T abnormalities, diffuse leads Confirmed by Quintella Reichert [phone]) on 07/25/2022 6:08:58 AM  Radiology DG Chest Port 1 View  Result Date: 07/25/2022 CLINICAL DATA:  41 year old female under screening evaluation. EXAM: PORTABLE CHEST 1 VIEW COMPARISON:  Chest x-ray 06/25/2022.  FINDINGS: Lung volumes are normal. No consolidative airspace disease. No pleural effusions. No pneumothorax. No pulmonary nodule or mass noted. Pulmonary vasculature and the cardiomediastinal silhouette are within normal limits. IMPRESSION: No radiographic evidence of acute cardiopulmonary disease.  Electronically Signed   By: Vinnie Langton M.D.   On: 07/25/2022 06:05    Procedures Procedures   CRITICAL CARE Performed by: Quintella Reichert   Total critical care time: 45 minutes  Critical care time was exclusive of separately billable procedures and treating other patients.  Critical care was necessary to treat or prevent imminent or life-threatening deterioration.  Critical care was time spent personally by me on the following activities: development of treatment plan with patient and/or surrogate as well as nursing, discussions with consultants, evaluation of patient's response to treatment, examination of patient, obtaining history from patient or surrogate, ordering and performing treatments and interventions, ordering and review of laboratory studies, ordering and review of radiographic studies, pulse oximetry and re-evaluation of patient's condition.  Medications Ordered in ED Medications  octreotide (SANDOSTATIN) 2 mcg/mL load via infusion 50 mcg (50 mcg Intravenous Bolus from Bag 07/25/22 0701)    And  octreotide (SANDOSTATIN) 500 mcg in sodium chloride 0.9 % 250 mL (2 mcg/mL) infusion (50 mcg/hr Intravenous New Bag/Given 07/25/22 0700)  magnesium sulfate IVPB 2 g 50 mL (2 g Intravenous New Bag/Given 07/25/22 0708)  potassium chloride 10 mEq in 100 mL IVPB (10 mEq Intravenous New Bag/Given 07/25/22 0706)  calcium gluconate 2 g/ 100 mL sodium chloride IVPB (has no administration in time range)  0.9 %  sodium chloride infusion (Manually program via Guardrails IV Fluids) (has no administration in time range)  tranexamic acid (CYKLOKAPRON) IVPB 1,000 mg (has no administration in time  range)  cefTRIAXone (ROCEPHIN) 1 g in sodium chloride 0.9 % 100 mL IVPB (has no administration in time range)  cefTRIAXone (ROCEPHIN) 1 g in sodium chloride 0.9 % 100 mL IVPB (0 g Intravenous Stopped 07/25/22 0641)  lactated ringers bolus 1,000 mL (1,000 mLs Intravenous Bolus 07/25/22 0619)  pantoprazole (PROTONIX) injection 40 mg (40 mg Intravenous Given 07/25/22 0610)  LORazepam (ATIVAN) injection 0.5 mg (0.5 mg Intravenous Given 07/25/22 0623)  ondansetron (ZOFRAN) injection 4 mg (4 mg Intravenous Given 07/25/22 0732)    ED Course/ Medical Decision Making/ A&P                           Medical Decision Making Amount and/or Complexity of Data Reviewed Labs: ordered.  Risk Prescription drug management. Decision regarding hospitalization.   Patient with history of alcohol liver disease, varices on recent upper endoscopy here for evaluation of hematemesis since yesterday.  She is ill-appearing on evaluation with normal blood pressures, tachycardia, scleral icterus.  No gross blood in her nares or posterior oropharynx.  She did come in with multiple emesis bags that contained gross blood.  She also has blood on her clothing.  She was started on octreotide, Rocephin, Protonix for presumed upper GI bleed from varices.  No evidence of epistaxis at this time.  Labs with hypokalemia-we will treat with magnesium, potassium replacement.  She was treated with a low-dose of Ativan for possible alcohol withdrawal as patient states she does feel an element of withdrawal at this time.  Hemoglobin is increased compared to recent hospitalization but suspect this is due to hemoconcentration and dehydration.  Discussed with Dr. Loletha Carrow with [facility] team will see the patient in consult.  Critical care consulted for admission for ongoing care.  Patient updated of findings of studies and she is in agreement with admission for ongoing care.        Final Clinical Impression(s) / ED Diagnoses Final  diagnoses:  Acute  upper GI bleed  Hypokalemia  Alcoholic cirrhosis, unspecified whether ascites present [facility])    Rx / DC Orders ED Discharge Orders     None         Quintella Reichert, MD 07/25/22 [phone]

## 2022-07-25 NOTE — H&P (Signed)
GASTROENTEROLOGY PROCEDURE H&P NOTE   Primary Care Physician: Elodia Florence., MD  HPI: Joann [name] is a 41 y.o. female who presents for who presents for EGD for evaluation of hematemesis and known cirrhosis with varices.  Past Medical History:  Diagnosis Date   [redacted] weeks gestation of pregnancy 03/22/2020   Alcohol abuse    Alcohol withdrawal delirium ([place]) 02/15/2022   Alcohol-induced acute pancreatitis    Alcoholic ketoacidosis 39/11/90   Anxiety    DTs (delirium tremens) ([place])    Elevated LFTs 10/24/2018   Encephalopathy acute    Hepatic encephalopathy ([place]) 12/24/2019   Hypertension    Hypokalemia 04/02/2019   Hypotension 02/24/2022   Liver disease    Past Surgical History:  Procedure Laterality Date   ECTOPIC PREGNANCY SURGERY     ESOPHAGOGASTRODUODENOSCOPY (EGD) WITH PROPOFOL N/A 12/27/2019   Procedure: ESOPHAGOGASTRODUODENOSCOPY (EGD) WITH PROPOFOL;  Surgeon: Carol Ada, MD;  Location: [place];  Service: Endoscopy;  Laterality: N/A;   ESOPHAGOGASTRODUODENOSCOPY (EGD) WITH PROPOFOL N/A 06/26/2022   Procedure: ESOPHAGOGASTRODUODENOSCOPY (EGD) WITH PROPOFOL;  Surgeon: Lavena Bullion, DO;  Location: [facility];  Service: Gastroenterology;  Laterality: N/A;   Current Facility-Administered Medications  Medication Dose Route Frequency Provider Last Rate Last Admin   0.9 %  sodium chloride infusion (Manually program via Guardrails IV Fluids)   Intravenous Once Julian Hy, DO   Stopping previously hung infusion at 07/25/22 1045   0.9 %  sodium chloride infusion (Manually program via Guardrails IV Fluids)   Intravenous Once Noemi Chapel P, DO       calcium gluconate 2 g/ 100 mL sodium chloride IVPB  2 g Intravenous Once Julian Hy, DO 100 mL/hr at 07/25/22 1534 2,000 mg at 07/25/22 1534   [START ON 07/26/2022] cefTRIAXone (ROCEPHIN) 2 g in sodium chloride 0.9 % 100 mL IVPB  2 g Intravenous Q24H Noemi Chapel P, DO       Chlorhexidine Gluconate Cloth 2 %  PADS 6 each  6 each Topical Daily Julian Hy, DO   6 each at 07/25/22 1040   docusate (COLACE) 50 MG/5ML liquid 100 mg  100 mg Per Tube BID Noemi Chapel P, DO       fentaNYL (SUBLIMAZE) bolus via infusion 50-100 mcg  50-100 mcg Intravenous Q15 min PRN Julian Hy, DO       fentaNYL (SUBLIMAZE) injection 50 mcg  50 mcg Intravenous Q15 min PRN Julian Hy, DO       fentaNYL (SUBLIMAZE) injection 50 mcg  50 mcg Intravenous Once Noemi Chapel P, DO       fentaNYL (SUBLIMAZE) injection 50-200 mcg  50-200 mcg Intravenous Q30 min PRN Julian Hy, DO   100 mcg at 07/25/22 1535   fentaNYL 2566mg in NS 2565m(1064mml) infusion-PREMIX  50-200 mcg/hr Intravenous Continuous ClaJulian HyO 10 mL/hr at 07/25/22 1552 100 mcg/hr at 11/33/00/7652263folic acid injection 1 mg  1 mg Intravenous Daily SimJennelle Human NP   1 mg at 07/25/22 1141   lactulose (CHRONULAC) enema 200 gm  300 mL Rectal Q4H ClaNoemi Chapel DO       LORazepam (ATIVAN) injection 2 mg  2 mg Intravenous Q4H PRN SimJennelle Human NP       octreotide (SANDOSTATIN) 500 mcg in sodium chloride 0.9 % 250 mL (2 mcg/mL) infusion  50 mcg/hr Intravenous Continuous BroMontine CircleA-C 25 mL/hr at 07/25/22 1558 50 mcg/hr at 07/25/22 1558  Oral care mouth rinse  15 mL Mouth Rinse PRN Julian Hy, DO       Oral care mouth rinse  15 mL Mouth Rinse Q2H Arnell Asal, NP       Oral care mouth rinse  15 mL Mouth Rinse PRN Arnell Asal, NP       [START ON 07/28/2022] pantoprazole (PROTONIX) injection 40 mg  40 mg Intravenous Q12H Noemi Chapel P, DO       pantoprozole (PROTONIX) 80 mg /NS 100 mL infusion  8 mg/hr Intravenous Continuous Noemi Chapel P, DO 10 mL/hr at 07/25/22 1046 8 mg/hr at 07/25/22 1046   PHENObarbital (LUMINAL) injection 97.5 mg  97.5 mg Intravenous Q8H Ellington, Abby K, RPH   97.5 mg at 07/25/22 1233   Followed by   Derrill Memo ON 07/27/2022] PHENObarbital (LUMINAL) injection 65 mg  65 mg Intravenous Q8H Ellington, Abby  K, RPH       Followed by   Derrill Memo ON 07/29/2022] PHENObarbital (LUMINAL) injection 32.5 mg  32.5 mg Intravenous Q8H Ellington, Abby K, RPH       phenylephrine 80 mcg/10 mL injection            polyethylene glycol (MIRALAX / GLYCOLAX) packet 17 g  17 g Per Tube Daily Noemi Chapel P, DO       propofol (DIPRIVAN) 1000 MG/100ML infusion  5-80 mcg/kg/min Intravenous Continuous Jennelle Human B, NP 13.34 mL/hr at 07/25/22 1420 35 mcg/kg/min at 07/25/22 1420   thiamine (VITAMIN B1) injection 100 mg  100 mg Intravenous Daily Jennelle Human B, NP   100 mg at 07/25/22 1124    Current Facility-Administered Medications:    0.9 %  sodium chloride infusion (Manually program via Guardrails IV Fluids), , Intravenous, Once, Julian Hy, DO, Stopping previously hung infusion at 07/25/22 1045   0.9 %  sodium chloride infusion (Manually program via Guardrails IV Fluids), , Intravenous, Once, Noemi Chapel P, DO   calcium gluconate 2 g/ 100 mL sodium chloride IVPB, 2 g, Intravenous, Once, Julian Hy, DO, Last Rate: 100 mL/hr at 07/25/22 1534, 2,000 mg at 07/25/22 1534   [START ON 07/26/2022] cefTRIAXone (ROCEPHIN) 2 g in sodium chloride 0.9 % 100 mL IVPB, 2 g, Intravenous, Q24H, Clark, Laura P, DO   Chlorhexidine Gluconate Cloth 2 % PADS 6 each, 6 each, Topical, Daily, Julian Hy, DO, 6 each at 07/25/22 1040   docusate (COLACE) 50 MG/5ML liquid 100 mg, 100 mg, Per Tube, BID, Julian Hy, DO   fentaNYL (SUBLIMAZE) bolus via infusion 50-100 mcg, 50-100 mcg, Intravenous, Q15 min PRN, Julian Hy, DO   fentaNYL (SUBLIMAZE) injection 50 mcg, 50 mcg, Intravenous, Q15 min PRN, Julian Hy, DO   fentaNYL (SUBLIMAZE) injection 50 mcg, 50 mcg, Intravenous, Once, [name], Laura P, DO   fentaNYL (SUBLIMAZE) injection 50-200 mcg, 50-200 mcg, Intravenous, Q30 min PRN, Julian Hy, DO, 100 mcg at 07/25/22 1535   fentaNYL 2557mg in NS 2547m(1072mml) infusion-PREMIX, 50-200 mcg/hr, Intravenous, Continuous,  ClaJulian HyO, Last Rate: 10 mL/hr at 07/25/22 1552, 100 mcg/hr at 11/50/93/2657124folic acid injection 1 mg, 1 mg, Intravenous, Daily, SimJennelle Human NP, 1 mg at 07/25/22 1141   lactulose (CHRONULAC) enema 200 gm, 300 mL, Rectal, Q4H, Clark, Laura P, DO   LORazepam (ATIVAN) injection 2 mg, 2 mg, Intravenous, Q4H PRN, SimJennelle Human NP   [COMPLETED] octreotide (SANDOSTATIN) 2 mcg/mL load via infusion 50 mcg, 50 mcg, Intravenous,  Once, 50 mcg at 07/25/22 0701 **AND** octreotide (SANDOSTATIN) 500 mcg in sodium chloride 0.9 % 250 mL (2 mcg/mL) infusion, 50 mcg/hr, Intravenous, Continuous, Montine Circle, PA-C, Last Rate: 25 mL/hr at 07/25/22 1558, 50 mcg/hr at 07/25/22 1558   Oral care mouth rinse, 15 mL, Mouth Rinse, PRN, Julian Hy, DO   Oral care mouth rinse, 15 mL, Mouth Rinse, Q2H, Jennelle Human B, NP   Oral care mouth rinse, 15 mL, Mouth Rinse, PRN, Arnell Asal, NP   [START ON 07/28/2022] pantoprazole (PROTONIX) injection 40 mg, 40 mg, Intravenous, Q12H, Clark, Laura P, DO   pantoprozole (PROTONIX) 80 mg /NS 100 mL infusion, 8 mg/hr, Intravenous, Continuous, Julian Hy, DO, Last Rate: 10 mL/hr at 07/25/22 1046, 8 mg/hr at 07/25/22 1046   PHENObarbital (LUMINAL) injection 97.5 mg, 97.5 mg, Intravenous, Q8H, 97.5 mg at 07/25/22 1233 **FOLLOWED BY** [START ON 07/27/2022] PHENObarbital (LUMINAL) injection 65 mg, 65 mg, Intravenous, Q8H **FOLLOWED BY** [START ON 07/29/2022] PHENObarbital (LUMINAL) injection 32.5 mg, 32.5 mg, Intravenous, Q8H, Ellington, Abby K, RPH   phenylephrine 80 mcg/10 mL injection, , , ,    polyethylene glycol (MIRALAX / GLYCOLAX) packet 17 g, 17 g, Per Tube, Daily, Clark, Laura P, DO   propofol (DIPRIVAN) 1000 MG/100ML infusion, 5-80 mcg/kg/min, Intravenous, Continuous, Simpson, Paula B, NP, Last Rate: 13.34 mL/hr at 07/25/22 1420, 35 mcg/kg/min at 07/25/22 1420   thiamine (VITAMIN B1) injection 100 mg, 100 mg, Intravenous, Daily, Jennelle Human B, NP,  100 mg at 07/25/22 1124 Allergies  Allergen Reactions   Latex Hives   Family History  Problem Relation Age of Onset   Healthy Mother    Alcohol abuse Mother        quit in her 62s   Alcohol abuse Father    Diabetes Father    Healthy Brother    Hypertension Maternal Grandmother    Hypertension Maternal Grandfather    Healthy Brother    Healthy Brother    Social History   Socioeconomic History   Marital status: Married    Spouse name: Not on file   Number of children: Not on file   Years of education: Not on file   Highest education level: Not on file  Occupational History   Occupation: nurse  Tobacco Use   Smoking status: Never   Smokeless tobacco: Never  Vaping Use   Vaping Use: Never used  Substance and Sexual Activity   Alcohol use: Yes    Comment: "I just do it"   Drug use: Never   Sexual activity: Yes    Birth control/protection: None  Other Topics Concern   Not on file  Social History Narrative   Not on file   Social Determinants of Health   Financial Resource Strain: Not on file  Food Insecurity: Not on file  Transportation Needs: Not on file  Physical Activity: Not on file  Stress: Not on file  Social Connections: Not on file  Intimate Partner Violence: Not on file    Physical Exam: Today's Vitals   07/25/22 1427 07/25/22 1500 07/25/22 1546 07/25/22 1600  BP:  (!) 167/110  116/63  Pulse:  (!) 127  (!) 123  Resp:  (!) 24  (!) 22  Temp:      TempSrc:      SpO2: 100% 100% 99% 98%  Weight:      Height:      PainSc:       Body mass index is 24.03 kg/m. GEN: Intubated/sedated EYE:  Sclerae icteric ENT: ETT in place CV: Tachycardic GI: Soft, protuberant abdomen NEURO:  Sedated  Lab Results: Recent Labs    07/25/22 0547 07/25/22 0613 07/25/22 1301  WBC 6.7  --  5.8  HGB 9.7* 11.9* 9.6*  HCT 33.7* 35.0* 32.8*  PLT 76*  --  38*   BMET Recent Labs    07/25/22 0547 07/25/22 0613 07/25/22 1304  NA 145 148* 147*  K 2.8* 5.0 3.6   CL 107 109 110  CO2 20*  --  18*  GLUCOSE 122* 127* 87  BUN 5* <3* 5*  CREATININE 0.63 0.80 0.58  CALCIUM 7.9*  --  7.7*   LFT Recent Labs    07/25/22 0547  PROT 10.2*  ALBUMIN 3.4*  AST 157*  ALT 14  ALKPHOS 243*  BILITOT 3.3*   PT/INR Recent Labs    07/25/22 0547 07/25/22 1301  LABPROT 19.1* 19.4*  INR 1.6* 1.7*     Impression / Plan: This is a 41 y.o.female who presents for EGD for evaluation of hematemesis and known cirrhosis with varices.  The risks and benefits of endoscopic evaluation/treatment were discussed with the patient and/or family; these include but are not limited to the risk of perforation, infection, bleeding, missed lesions, lack of diagnosis, severe illness requiring hospitalization, as well as anesthesia and sedation related illnesses.  The patient's history has been reviewed, patient examined, no change in status, and deemed stable for procedure.  The patient and/or family is agreeable to proceed.    Justice Britain, MD [facility] Advanced Endoscopy Office # [phone]

## 2022-07-25 NOTE — ED Provider Triage Note (Signed)
  Emergency Medicine Provider Triage Evaluation Note  MRN:  [id number]  Arrival date & time: 07/25/22    Medically screening exam initiated at 5:36 AM.   CC:   Hematemesis   HPI:  Joann [name] is a 41 y.o. year-old female presents to the ED with chief complaint of hematemesis.  Hx of cirrhosis 2/2 ETOH abuse with esophageal varices.  She states that she has been vomiting blood since yesterday.  States she feels dehydrated.  Recent admission for ETOH withdrawal..  History provided by patient. ROS:  -As included in HPI PE:   Vitals:   07/25/22 0528  BP: 116/77  Pulse: (!) 120  Resp: 20  Temp: 98.3 F (36.8 C)  SpO2: 97%    Non-toxic appearing No respiratory distress  MDM:  Based on signs and symptoms, upper GI bleed is highest on my differential. I've ordered labs, octreotide and rocephin in triage to expedite lab/diagnostic workup.  Dr. Ralene Bathe, triage RN, and charge RN all notified that patient needs a room.  Patient was informed that the remainder of the evaluation will be completed by another provider, this initial triage assessment does not replace that evaluation, and the importance of remaining in the ED until their evaluation is complete.    Montine Circle, PA-C 07/25/22 [phone]

## 2022-07-25 NOTE — Progress Notes (Signed)
GI recommending she remain intubated overnight due to high risk of rebleeding. Will sedate with propofol and fentanyl PRN with hopes for extubation tomorrow. Flexi for frequent lactulose. Will not be able to have NGT.   Julian Hy, DO 07/25/22 3:33 PM [facility]

## 2022-07-25 NOTE — TOC Initial Note (Signed)
Transition of Care [facility]) - Initial/Assessment Note    Patient Details  Name: Joann [name] MRN: [id number] Date of Birth: 07/12/1981  [facility] CM/SW Contact:    Roseanne Kaufman, RN Phone Number: 07/25/2022, 1:12 PM  Clinical Narrative:     Received TOC consult for SA resources. This RNCM spoke with patient at bedside to offer substance abuse resources, patient agrees. This RNCM will attach SA resources to AVS.  Transportation at discharge: family  TOC will continue to follow for discharge needs.               Expected Discharge Plan: Home/Self Care Barriers to Discharge: Continued Medical Work up   Patient Goals and CMS Choice Patient states their goals for this hospitalization and ongoing recovery are:: return home      Expected Discharge Plan and Services Expected Discharge Plan: Home/Self Care In-house Referral: NA Discharge Planning Services: CM Consult                     DME Arranged: N/A DME Agency: NA       HH Arranged: NA HH Agency: NA        Prior Living Arrangements/Services   Lives with:: Self Patient language and need for interpreter reviewed:: Yes Do you feel safe going back to the place where you live?: Yes      Need for Family Participation in Patient Care: No (Comment) Care giver support system in place?: Yes (comment) Current home services: Other (comment) (none) Criminal Activity/Legal Involvement Pertinent to Current Situation/Hospitalization: No - Comment as needed  Activities of Daily Living      Permission Sought/Granted Permission sought to share information with : Case Manager Permission granted to share information with : Yes, Verbal Permission Granted              Emotional Assessment Appearance:: Appears stated age Attitude/Demeanor/Rapport: Gracious Affect (typically observed): Accepting Orientation: : Oriented to Self, Oriented to Place, Oriented to  Time, Oriented to Situation Alcohol / Substance Use:  Alcohol Use Psych Involvement: No (comment)  Admission diagnosis:  Hypokalemia [E87.6] GI bleed [K92.2] Acute upper GI bleed [C58.8] Alcoholic cirrhosis, unspecified whether ascites present ([place]) [K70.30] Patient Active Problem List   Diagnosis Date Noted   GI bleed 07/25/2022   Acute upper gastrointestinal bleeding 07/01/2022   Sepsis due to methicillin susceptible Staphylococcus aureus (MSSA) with acute organ dysfunction and septic shock (HCC) 07/01/2022   Lactic acidosis 07/01/2022   AKI (acute kidney injury) ([place]) 07/01/2022   Essential hypertension 07/01/2022   Ileus ([place]) 07/01/2022   Ulcer of esophagus without bleeding    Portal hypertensive gastropathy (HCC)    Alcohol withdrawal syndrome, with delirium ([place])    Alcoholic hepatitis    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[place])    Abnormal LFTs    Alcoholic gastritis with bleeding 04/17/2022   ETOH abuse 04/16/2022   Electrolyte abnormality 02/24/2022   Leukopenia 02/24/2022   Coagulopathy ([place]) 02/24/2022   Severe protein-calorie malnutrition ([place]) 02/24/2022   Anxiety    Symptomatic anemia 12/17/2021   Esophageal varices without bleeding ([place]) 50/27/7412   Alcoholic cirrhosis of liver without ascites ([place]) 12/31/2020   Reactive depression    Wernicke's encephalopathy    Transaminitis    Steroid-induced hyperglycemia    Leucocytosis    Macrocytic anemia    Sleep disturbance    Pressure injury of skin 87/86/7672   Alcoholic hepatitis with ascites    Acute respiratory failure (  [facility])    Palliative care encounter    Elevated bilirubin    Ascites    Advanced care planning/counseling discussion    Goals of care, counseling/discussion    Palliative care by specialist    Hypoalbuminemia    Acute hepatic encephalopathy ([place]) 12/24/2019   Alcohol dependence with withdrawal, unspecified ([place]) 10/24/2018   Thrombocytopenia ([place]) 10/24/2018   Alcohol abuse with alcohol-induced mood disorder ([place])  09/14/2017   PCP:  Elodia Florence., MD Pharmacy:   [id number] [place], [id number]: [phone]: [address]: [phone]: [phone]    Social Determinants of Health (SDOH) Interventions    Readmission Risk Interventions    02/20/2022    2:32 PM 02/19/2022   10:32 AM  Readmission Risk Prevention Plan  Transportation Screening    PCP or Specialist Appt within 3-5 Days    HRI or HBrewsterWork Consult for RGrand Blanc[place]anning/Counseling    [place]iansburg[place]ening    Medication Review (Press photographer       Information is confidential and restricted. Go to Review Flowsheets to unlock data.

## 2022-07-25 NOTE — Progress Notes (Signed)
EGD to be performed at bedside. No anesthesia available for case. Discussed with GI-- with her alcohol history I do not think she will be able to be heavily enough sedated with conscious sedation for a safe EGD. The safest way to proceed is heavy sedation and endotracheal intubation to ensure adequate ventilation. I discussed this with the patient and she consented for intubation with hopeful extubation after the procedure later today. Consent signed and placed in chart.  Julian Hy, DO 07/25/22 2:27 PM [facility]

## 2022-07-25 NOTE — ED Notes (Signed)
Per ICU Md Clark, ffp and blood to be given within the next 2hrs

## 2022-07-25 NOTE — Op Note (Signed)
[facility] Patient Name: Joann [name] Procedure Date: 07/25/2022 MRN: [id number] Attending MD: Justice Britain , MD, [id number] Date of Birth: 1980-10-11 CSN: [id number] Age: 41 Admit Type: Inpatient Procedure:                Upper GI endoscopy Indications:              Acute post hemorrhagic anemia, Hematemesis,                            Follow-up of esophageal varices, For therapy of                            esophageal varices Providers:                Justice Britain, MD, Jaci Carrel, RN, Cherylynn Ridges, Technician Referring MD:             ICU Team Medicines:                Anesthesia per ICU team (she is intubated) Complications:            No immediate complications. Estimated Blood Loss:     Estimated blood loss was minimal. Procedure:                Pre-Anesthesia Assessment:                           - Prior to the procedure, a History and Physical                            was performed, and patient medications and                            allergies were reviewed. The patient's tolerance of                            previous anesthesia was also reviewed. The risks                            and benefits of the procedure and the sedation                            options and risks were discussed with the patient.                            All questions were answered, and informed consent                            was obtained. Prior Anticoagulants: The patient has                            taken no anticoagulant or antiplatelet agents. ASA  Grade Assessment: III - A patient with severe                            systemic disease. After reviewing the risks and                            benefits, the patient was deemed in satisfactory                            condition to undergo the procedure.                           After obtaining informed consent, the endoscope was                             passed under direct vision. Throughout the                            procedure, the patient's blood pressure, pulse, and                            oxygen saturations were monitored continuously. The                            [id number]-[id number] ([id number] Olympus therapeutic endoscope                            was introduced through the mouth, and advanced to                            the second part of duodenum. The upper GI endoscopy                            was accomplished without difficulty. The patient                            tolerated the procedure. Scope In: Scope Out: Findings:      No gross lesions were noted in the proximal esophagus and in the mid       esophagus.      Three columns of grade II, grade III varices with stigmata of recent       bleeding were found in the distal esophagus. Red wale signs were       present. Six bands were successfully placed with complete eradication,       resulting in deflation of varices. There was no bleeding at the end of       the procedure.      The Z-line was irregular and was found 39 cm from the incisors.      A 1 cm hiatal hernia was present.      Moderate portal hypertensive gastropathy was found in the entire       examined stomach.      A single small angiodysplastic lesion with bleeding was found in the       duodenal bulb. Fulguration to ablate the lesion by argon plasma was  successful. To prevent bleeding post-intervention, one hemostatic clip       was successfully placed (MR conditional). Clip manufacturer: [employer]. There was no bleeding at the end of the procedure.      No other gross lesions were noted in the duodenal bulb, in the first       portion of the duodenum and in the second portion of the duodenum. Impression:               - No gross lesions in the proximal esophagus and in                            the mid esophagus.                           - Grade II and grade III esophageal  varices with                            stigmata of recent bleeding found in the distal                            esophagus. Completely eradicated. Banded.                           - Z-line irregular, 39 cm from the incisors.                           - 1 cm hiatal hernia.                           - Portal hypertensive gastropathy throughout.                           - A single bleeding angiodysplastic lesion in the                            duodenum. Treated with argon plasma coagulation                            (APC). Clip (MR conditional) was placed. Clip                            manufacturer: [employer].                           - No gross lesions in the duodenal bulb, in the                            first portion of the duodenum and in the second                            portion of the duodenum. Moderate Sedation:      Not Applicable - Patient had care per Anesthesia. Recommendation:           - The patient will be observed post-procedure,  until all discharge criteria are met.                           - Return patient to ICU for ongoing care.                           - NPO. No OGT/NGT should be placed at this time                            with recently placed variceal bands. If prolonged                            intubation does occur, would need to discuss                            high-risk placement of Dobhoff vs continued NPO and                            TPN.                           - Continue IV PPI drip for 72 hours.                           - Continue IV Octreotide drip for at least 72 hours.                           - Continue Ceftriaxone daily x 5-days (can                            transition to other medications that are not IV                            after 3-days if necessary).                           - Please use Lactulose enemas Q6H.                           - Observe patient's clinical course.                            - Trend Hgb/Hct. Goal Hgb 7-8 in cirrhotics                            (minimize overtransfusion as able).                           - Repeat EGD in 4-weeks for further variceal                            banding is indicated (however, not clear that  patient will follow up in outpatient setting as she                            has previously not followed up).                           - The findings and recommendations were discussed                            with the referring physician. Procedure Code(s):        --- Professional ---                           [phone], Esophagogastroduodenoscopy, flexible,                            transoral; with band ligation of esophageal/gastric                            varices Diagnosis Code(s):        --- Professional ---                           I85.01, Esophageal varices with bleeding                           K22.89, Other specified disease of esophagus                           K44.9, Diaphragmatic hernia without obstruction or                            gangrene                           K76.6, Portal hypertension                           K31.89, Other diseases of stomach and duodenum                           K31.811, Angiodysplasia of stomach and duodenum                            with bleeding                           D62, Acute posthemorrhagic anemia                           K92.0, Hematemesis CPT copyright 2022 American [employer] Association. All rights reserved. The codes documented in this report are preliminary and upon coder review may  be revised to meet current compliance requirements. Justice Britain, MD 07/25/2022 4:29:52 PM Number of Addenda: 0

## 2022-07-25 NOTE — ED Notes (Signed)
Pt reported some discomfort at iv with K infusion, md notified, ns order obtained to dilute K runs, pt tolerating K better now.  Checked with pharmacy for compatibility

## 2022-07-25 NOTE — ED Notes (Signed)
Pt vomited a small amount of bright red blood

## 2022-07-25 NOTE — H&P (Signed)
NAME:  Joann [name], MRN:  [id number], DOB:  [birth year], LOS: 0 ADMISSION DATE:  07/25/2022, CONSULTATION DATE:  07/25/2022 REFERRING MD:  Dr. Ralene Bathe, CHIEF COMPLAINT:  hematemesis    History of Present Illness:  41 year old female with PMH significant for current ETOH abuse, ETOH cirrhosis, DTs w/seizures, hepatic encephalopathy, HTN, and anxiety presenting to [facility] ER with complaints of nausea with multiple episodes of hematemesis since Tuesday morning with RUQ abdominal, weakness, unable to eat in three days, and several days of dark stools.  Reports she drinks a gallon of vodka daily, last drink was yesterday.  Hx of seizures when she tries to quit.  Denies any fever or chills.  Has recently vomited in ER with liquid dark bloody emesis.   Recent admit 10/9- 10/24 with ETOH withdrawals after trying to detox at home, with hospitalization complicated by hepatic encephalopathy, anemia with coffee ground emesis s/p EGD 10/17 w/ [facility], MSSA bacteremia, and ileus.  EGD revealed non bleeding esophageal ulcer (suspected from recent cortrak), grade 1 esophageal varices, and moderate portal HTN gastropathy.   In ER, patient afebrile, tachycardic in 120's, normotensive, and 97% on room air.  Labs significant for Hgb 9.7 (baseline 7-8), HCT 33.7, plts 76, INR 1.6, PT 19, Na 145, K 2.8, bicarb 20, sCr 0.63, iCa 0.82, alk phos 243, AST 157, ALT 14, t. Bili 3.3, CXR without acute process.  Patient given protonix IV, and started on KCL and mag replete.  GI called by EDP and PCCM called for admit.   Pertinent  Medical History  ETOH abuse, ETOH cirrhosis with grade 1 esophageal varices, portal HTN gastropathy, DTs w/seizures, hepatic encephalopathy, HTN, anxiety  Previously f/b [facility] Events: Including procedures, antibiotic start and stop dates in addition to other pertinent events   11/15 Admitted hematemesis   Interim History / Subjective:   Objective   Blood pressure 121/77,  pulse (!) 115, temperature 98.3 F (36.8 C), temperature source Oral, resp. rate 16, height _0  (1.626 m), weight 63.5 kg, SpO2 97 %.        Intake/Output Summary (Last 24 hours) at 07/25/2022 0740 Last data filed at 07/25/2022 0641 Gross per 24 hour  Intake 100 ml  Output --  Net 100 ml   Filed Weights   07/25/22 0528  Weight: 63.5 kg   Examination: General:  Adult female lying in bed in NAD, calm and appropriate  HEENT: MM pink/moist, pupils 3/ anicteric  Neuro:  alert, oriented, MAE, no tremors  CV: rr, ST, no murmur PULM:  non labored, CTA GI: obese, tender in RUQ, +bs, no obvious fluid wave  Extremities: warm/dry, no LE edema  Skin: no rashes   Resolved Hospital Problem list    Assessment & Plan:   Hematemesis with suspected upper GIB ETOH cirrhosis with hx grade 1 esophageal varies and portal HTN (per EGD 06/26/2022) Acute on chronic Anemia  Coagulapathy - acute on chronic thrombocytopenia, elevated INR High risk for hepatic encephalopathy  Plan: - admit to ICU - BP remains stable thus far, ongoing tachycardia  - currently protecting airway, high risk for airway compromise , cont close monitoring - [facility] consulted> will see with plans for EGD today - remains NPO - continue PPI BID and octreotide infusion - 2 units FFP now with TXA f/b 2 units PRBC - currently has 3 PIVs, patient would be ok with CVL placement if needed  - Hgb 9.7 which is above her baseline 7-8 and  ongoing tachycardia felt to be hemoconcentrated given recent poor PO intake    - empiric ceftriaxone 2gm daily - send blood cultures empirically given recent MSSA bacteremia - check KUB given abd tenderness to r/o perforation  - daily CBC, CMET, coag, Mag - hepatitis discriminant score 26.3, defer steroids for now - MELD score 16, 6% of 3 month mortality  - pending ammonia - ongoing ETOH cessation   Hypokalemia Hypocalcemia  - KCL replete in ER w/ Mag - 2gm of calcium gluconate now  -  recheck electrolytes in am   High risk for ETOH withdrawal, hx of Dts ETOH abuse - daily 1 gallon of vodka, last drink 11/14 - check ETOH level - high risk phenobarb protocol/ taper  - CIWA with prn IV ativan q 4hrs  - close monitoring for DTs - empiric daily thiamine/ folate and add MVI when taking POs  HTN - hold home coreg for now given risk of hemodynamic compromise   Best Practice (right click and "Reselect all SmartList Selections" daily)   Diet/type: NPO DVT prophylaxis: SCD GI prophylaxis: PPI Lines: N/A Foley:  N/A Code Status:  full code Last date of multidisciplinary goals of care discussion [11/15]  Husband, Celesta Gentile [phone] (truck driver)> unable to be reached by phone thus far.  Patient states he does not know she is here and ask [place] to call him or her mother.   > was able to speak with husband Terrance by phone> updated him, all questions answered thus far.  He is currently in [place] in a training class> advises if he can not answer phone to leave a message and he would call back.   Labs   CBC: Recent Labs  Lab 07/25/22 0547 07/25/22 0613  WBC 6.7  --   NEUTROABS 3.8  --   HGB 9.7* 11.9*  HCT 33.7* 35.0*  MCV 88.0  --   PLT 76*  --     Basic Metabolic Panel: Recent Labs  Lab 07/25/22 0547 07/25/22 0613  NA 145 148*  K 2.8* 5.0  CL 107 109  CO2 20*  --   GLUCOSE 122* 127*  BUN 5* <3*  CREATININE 0.63 0.80  CALCIUM 7.9*  --    GFR: Estimated Creatinine Clearance: 78.3 mL/min (by C-G formula based on SCr of 0.8 mg/dL). Recent Labs  Lab 07/25/22 0547  WBC 6.7    Liver Function Tests: Recent Labs  Lab 07/25/22 0547  AST 157*  ALT 14  ALKPHOS 243*  BILITOT 3.3*  PROT 10.2*  ALBUMIN 3.4*   No results for input(s): "LIPASE", "AMYLASE" in the last 168 hours. No results for input(s): "AMMONIA" in the last 168 hours.  ABG    Component Value Date/Time   PHART 7.448 12/31/2019 0826   PCO2ART 38.2 12/31/2019 0826   PO2ART 346  (H) 12/31/2019 0826   HCO3 26.3 12/31/2019 0826   TCO2 21 (L) 07/25/2022 0613   ACIDBASEDEF 2.5 (H) 12/30/2019 1655   O2SAT 100.0 12/31/2019 0826     Coagulation Profile: Recent Labs  Lab 07/25/22 0547  INR 1.6*    Cardiac Enzymes: No results for input(s): "CKTOTAL", "CKMB", "CKMBINDEX", "TROPONINI" in the last 168 hours.  HbA1C: Hgb A1c MFr Bld  Date/Time Value Ref Range Status  09/29/2018 03:23 PM 4.8 4.8 - 5.6 % Final    Comment:             Prediabetes: 5.7 - 6.4          Diabetes: >6.4  Glycemic control for adults with diabetes: <7.0     CBG: No results for input(s): "GLUCAP" in the last 168 hours.  Review of Systems:   Review of Systems  Constitutional:  Positive for malaise/fatigue. Negative for chills and fever.  Respiratory:  Negative for cough and shortness of breath.   Cardiovascular:  Negative for leg swelling.  Gastrointestinal:  Positive for abdominal pain, blood in stool, nausea and vomiting. Negative for diarrhea.  Neurological:  Negative for focal weakness.   Past Medical History:  She,  has a past medical history of [redacted] weeks gestation of pregnancy (03/22/2020), Alcohol abuse, Alcohol withdrawal delirium ([place]) (02/15/2022), Alcohol-induced acute pancreatitis, Alcoholic ketoacidosis (16/96/7893), Anxiety, DTs (delirium tremens) ([place]), Elevated LFTs (10/24/2018), Encephalopathy acute, Hepatic encephalopathy ([place]) (12/24/2019), Hypertension, Hypokalemia (04/02/2019), Hypotension (02/24/2022), and Liver disease.   Surgical History:   Past Surgical History:  Procedure Laterality Date   ECTOPIC PREGNANCY SURGERY     ESOPHAGOGASTRODUODENOSCOPY (EGD) WITH PROPOFOL N/A 12/27/2019   Procedure: ESOPHAGOGASTRODUODENOSCOPY (EGD) WITH PROPOFOL;  Surgeon: Carol Ada, MD;  Location: [facility];  Service: Endoscopy;  Laterality: N/A;   ESOPHAGOGASTRODUODENOSCOPY (EGD) WITH PROPOFOL N/A 06/26/2022   Procedure: ESOPHAGOGASTRODUODENOSCOPY (EGD) WITH PROPOFOL;   Surgeon: Lavena Bullion, DO;  Location: [facility];  Service: Gastroenterology;  Laterality: N/A;    Social History:   reports that she has never smoked. She has never used smokeless tobacco. She reports current alcohol use. She reports that she does not use drugs.   Family History:  Her family history includes Alcohol abuse in her father and mother; Diabetes in her father; Healthy in her brother, brother, brother, and mother; Hypertension in her maternal grandfather and maternal grandmother.   Allergies Allergies  Allergen Reactions   Latex Hives     Home Medications  Prior to Admission medications   Medication Sig Start Date End Date Taking? Authorizing Provider  carvedilol (COREG) 3.125 MG tablet Take 1 tablet (3.125 mg total) by mouth 2 (two) times daily with a meal. 07/03/22   Shalhoub, Sherryll Burger, MD  ferrous sulfate 325 (65 FE) MG tablet Take 1 tablet (325 mg total) by mouth daily before breakfast. Take with a cup of juice Patient taking differently: Take 325 mg by mouth daily before breakfast. 07/03/22 10/01/22  Shalhoub, Sherryll Burger, MD  folic acid (FOLVITE) 1 MG tablet Take 1 tablet (1 mg total) by mouth daily. 07/03/22   Shalhoub, Sherryll Burger, MD  Multiple Vitamin (MULTIVITAMIN WITH MINERALS) TABS tablet Take 1 tablet by mouth daily. 01/15/20   Angiulli, Lavon Paganini, PA-C  pantoprazole (PROTONIX) 40 MG tablet Take 1 tablet (40 mg total) by mouth 2 (two) times daily. 07/03/22   Shalhoub, Sherryll Burger, MD  phytonadione (VITAMIN K) 5 MG tablet Take 2 tablets (10 mg total) by mouth daily. Patient not taking: Reported on 06/21/2022 02/26/22   Mansouraty, Telford Nab., MD  rifaximin (XIFAXAN) 550 MG TABS tablet Take 1 tablet (550 mg total) by mouth 2 (two) times daily. 07/03/22   Shalhoub, Sherryll Burger, MD  sucralfate (CARAFATE) 1 g tablet Take 1 tablet (1 g total) by mouth 4 (four) times daily -  with meals and at bedtime. 07/03/22   Shalhoub, Sherryll Burger, MD  thiamine (VITAMIN B-1) 100 MG tablet Take 1  tablet (100 mg total) by mouth daily. 07/04/22   Shalhoub, Sherryll Burger, MD  traZODone (DESYREL) 150 MG tablet Take 150 mg by mouth at bedtime. 01/11/22   [provider]     Critical care time: 50 mins  Kennieth Rad, MSN, AG-ACNP-BC [facility] 07/25/2022, 8:40 AM  See Amion for pager If no response to pager, please call PCCM consult pager After 7:00 pm call Elink

## 2022-07-25 NOTE — ED Notes (Signed)
RN attempted to call pt mother and husband per pt request. No answer to neither.

## 2022-07-25 NOTE — ED Triage Notes (Signed)
Pt  BIB EMS from home with c/o hematemesis. Per ems pt had 3 large cups of dark red blood and blood clots at the house. Pt states that she has hx of esophageal varices.

## 2022-07-25 NOTE — ED Notes (Signed)
Gi at bedside

## 2022-07-25 NOTE — Consult Note (Addendum)
Consultation  Referring Provider: CCM/ [name] Primary Care Physician:  Elodia Florence., MD Primary Gastroenterologist:  Dr.Mansouraty  Reason for Consultation:  vomiting blood cirrhosis  HPI: Joann [name] is a 41 y.o. female, with history of EtOH induced cirrhosis, prior history of alcoholic hepatitis, history of hepatic encephalopathy prior history of iron deficiency anemia and GI bleeding.  She has history of small varices but to my knowledge has not had a variceal bleed. She was last seen in our office in June 2023, at that time plans were made for EGD and colonoscopy however she relapsed with EtOH abuse shortly after that time and procedures were not scheduled.  She was admitted in June 2023 with altered mental status and EtOH withdrawal.  She was to go to [facility] for EtOH rehab but she says they declined her due to underlying cirrhosis etc. She was recently admitted mid October with altered mental status hepatic encephalopathy and then EtOH withdrawal.  She also had MSSA sepsis at that time. Due to complaints of coffee-ground emesis she underwent EGD on 06/26/2022 per Dr. Bryan Lemma with finding of a single column of grade 1/small varices in the lower one third of the esophagus, these were not treated and 1 small cratered esophageal ulcer measuring 4 mm felt probably a pressure related ulcer from core track, noted to have moderate portal gastropathy.  She was discharged home on 07/03/2022.  Hemoglobin at that time was 7.5 and I believe she received 1 unit of blood that day prior to being discharged. She says that she has been drinking a gallon of vodka every day since she was released from the hospital and over the past week or so really has not been eating much of anything but continues to drink vodka.  Her last vodka was yesterday morning.  She then developed hematemesis and describes 3-4 episodes of bright red blood at home, when I asked her to describe the amount she says similar to  what she is doing here in the emergency room which is very small volume may be 50 cc at most at a time of stringy red blood.  She had not noticed dark stools but now on questioning thinks that her stools have been dark over the past few days. Denies any abdominal pain.  On arrival blood pressure 121/77 pulse 115 she is tremulous, and says she feels like she is going into withdrawal. Had about 3 episodes of small-volume hematemesis over the past couple of hours again no more than 50 cc at time of thick stringy red blood.  Labs on arrival WBC 6.7/hemoglobin 9.7/hematocrit 33.7/platelets 76 INR 1.6/pro time 19.1 Potassium 2.8 BUN 5/creatinine 0.63 T. bili 3.3/alk phos 243/ALT 157/ALT 14 Venous ammonia 140  Repeat hemoglobin 11. 9 at 6:15 AM  She is mentating well currently no family present  She has been started on PPI infusion, octreotide, is to receive 2 units of FFP which have been started, and blood transfusions had been ordered by CCM.     Past Medical History:  Diagnosis Date   [redacted] weeks gestation of pregnancy 03/22/2020   Alcohol abuse    Alcohol withdrawal delirium ([place]) 02/15/2022   Alcohol-induced acute pancreatitis    Alcoholic ketoacidosis 80/22/3361   Anxiety    DTs (delirium tremens) ([place])    Elevated LFTs 10/24/2018   Encephalopathy acute    Hepatic encephalopathy ([place]) 12/24/2019   Hypertension    Hypokalemia 04/02/2019   Hypotension 02/24/2022   Liver disease  Past Surgical History:  Procedure Laterality Date   ECTOPIC PREGNANCY SURGERY     ESOPHAGOGASTRODUODENOSCOPY (EGD) WITH PROPOFOL N/A 12/27/2019   Procedure: ESOPHAGOGASTRODUODENOSCOPY (EGD) WITH PROPOFOL;  Surgeon: Carol Ada, MD;  Location: [facility];  Service: Endoscopy;  Laterality: N/A;   ESOPHAGOGASTRODUODENOSCOPY (EGD) WITH PROPOFOL N/A 06/26/2022   Procedure: ESOPHAGOGASTRODUODENOSCOPY (EGD) WITH PROPOFOL;  Surgeon: Lavena Bullion, DO;  Location: [facility];  Service: Gastroenterology;   Laterality: N/A;    Prior to Admission medications   Medication Sig Start Date End Date Taking? Authorizing Provider  carvedilol (COREG) 3.125 MG tablet Take 1 tablet (3.125 mg total) by mouth 2 (two) times daily with a meal. 07/03/22   Shalhoub, Sherryll Burger, MD  ferrous sulfate 325 (65 FE) MG tablet Take 1 tablet (325 mg total) by mouth daily before breakfast. Take with a cup of juice Patient taking differently: Take 325 mg by mouth daily before breakfast. 07/03/22 10/01/22  Shalhoub, Sherryll Burger, MD  folic acid (FOLVITE) 1 MG tablet Take 1 tablet (1 mg total) by mouth daily. 07/03/22   Shalhoub, Sherryll Burger, MD  Multiple Vitamin (MULTIVITAMIN WITH MINERALS) TABS tablet Take 1 tablet by mouth daily. 01/15/20   Angiulli, Lavon Paganini, PA-C  pantoprazole (PROTONIX) 40 MG tablet Take 1 tablet (40 mg total) by mouth 2 (two) times daily. 07/03/22   Shalhoub, Sherryll Burger, MD  phytonadione (VITAMIN K) 5 MG tablet Take 2 tablets (10 mg total) by mouth daily. Patient not taking: Reported on 06/21/2022 02/26/22   Mansouraty, Telford Nab., MD  rifaximin (XIFAXAN) 550 MG TABS tablet Take 1 tablet (550 mg total) by mouth 2 (two) times daily. 07/03/22   Shalhoub, Sherryll Burger, MD  sucralfate (CARAFATE) 1 g tablet Take 1 tablet (1 g total) by mouth 4 (four) times daily -  with meals and at bedtime. 07/03/22   Shalhoub, Sherryll Burger, MD  thiamine (VITAMIN B-1) 100 MG tablet Take 1 tablet (100 mg total) by mouth daily. 07/04/22   Shalhoub, Sherryll Burger, MD  traZODone (DESYREL) 150 MG tablet Take 150 mg by mouth at bedtime. 01/11/22   [provider]    Current Facility-Administered Medications  Medication Dose Route Frequency Provider Last Rate Last Admin   0.9 %  sodium chloride infusion (Manually program via Guardrails IV Fluids)   Intravenous Once Noemi Chapel P, DO       calcium gluconate 2 g/ 100 mL sodium chloride IVPB  2 g Intravenous Once Julian Hy, DO 100 mL/hr at 07/25/22 0809 2,000 mg at 07/25/22 0809   cefTRIAXone  (ROCEPHIN) 1 g in sodium chloride 0.9 % 100 mL IVPB  1 g Intravenous Once Julian Hy, DO       [START ON 07/26/2022] cefTRIAXone (ROCEPHIN) 2 g in sodium chloride 0.9 % 100 mL IVPB  2 g Intravenous Q24H Noemi Chapel P, DO       folic acid injection 1 mg  1 mg Intravenous Daily Jennelle Human B, NP       lactulose (CHRONULAC) enema 200 gm  300 mL Rectal Q6H Jennelle Human B, NP       LORazepam (ATIVAN) injection 0-4 mg  0-4 mg Intravenous Q4H PRN Jennelle Human B, NP       Followed by   Derrill Memo ON 07/27/2022] LORazepam (ATIVAN) injection 0-4 mg  0-4 mg Intravenous Q8H PRN Jennelle Human B, NP       octreotide (SANDOSTATIN) 500 mcg in sodium chloride 0.9 % 250 mL (2 mcg/mL) infusion  50  mcg/hr Intravenous Continuous Montine Circle, PA-C 25 mL/hr at 07/25/22 0700 50 mcg/hr at 07/25/22 0700   [START ON 07/28/2022] pantoprazole (PROTONIX) injection 40 mg  40 mg Intravenous Q12H Noemi Chapel P, DO       pantoprozole (PROTONIX) 80 mg /NS 100 mL infusion  8 mg/hr Intravenous Continuous Julian Hy, DO 10 mL/hr at 07/25/22 0840 8 mg/hr at 07/25/22 0840   PHENObarbital (LUMINAL) injection 97.5 mg  97.5 mg Intravenous Q8H Jennelle Human B, NP       Followed by   Derrill Memo ON 07/27/2022] PHENObarbital (LUMINAL) injection 65 mg  65 mg Intravenous Q8H Jennelle Human B, NP       Followed by   Derrill Memo ON 07/29/2022] PHENObarbital (LUMINAL) injection 32.5 mg  32.5 mg Intravenous Q8H Jennelle Human B, NP       potassium chloride 10 mEq in 100 mL IVPB  10 mEq Intravenous Q1 Hr x 4 Quintella Reichert, MD   Stopped at 07/25/22 0981   thiamine (VITAMIN B1) injection 100 mg  100 mg Intravenous Daily Jennelle Human B, NP       Current Outpatient Medications  Medication Sig Dispense Refill   carvedilol (COREG) 3.125 MG tablet Take 1 tablet (3.125 mg total) by mouth 2 (two) times daily with a meal. 60 tablet 2   ferrous sulfate 325 (65 FE) MG tablet Take 1 tablet (325 mg total) by mouth daily before breakfast. Take with  a cup of juice (Patient taking differently: Take 325 mg by mouth daily before breakfast.) 30 tablet 2   folic acid (FOLVITE) 1 MG tablet Take 1 tablet (1 mg total) by mouth daily. 30 tablet 0   Multiple Vitamin (MULTIVITAMIN WITH MINERALS) TABS tablet Take 1 tablet by mouth daily.     pantoprazole (PROTONIX) 40 MG tablet Take 1 tablet (40 mg total) by mouth 2 (two) times daily. 60 tablet 2   phytonadione (VITAMIN K) 5 MG tablet Take 2 tablets (10 mg total) by mouth daily. (Patient not taking: Reported on 06/21/2022) 7 tablet 0   rifaximin (XIFAXAN) 550 MG TABS tablet Take 1 tablet (550 mg total) by mouth 2 (two) times daily. 60 tablet 1   sucralfate (CARAFATE) 1 g tablet Take 1 tablet (1 g total) by mouth 4 (four) times daily -  with meals and at bedtime. 90 tablet 1   thiamine (VITAMIN B-1) 100 MG tablet Take 1 tablet (100 mg total) by mouth daily. 30 tablet 0   traZODone (DESYREL) 150 MG tablet Take 150 mg by mouth at bedtime.      Allergies as of 07/25/2022 - Review Complete 07/25/2022  Allergen Reaction Noted   Latex Hives 01/08/2020    Family History  Problem Relation Age of Onset   Healthy Mother    Alcohol abuse Mother        quit in her 57s   Alcohol abuse Father    Diabetes Father    Healthy Brother    Hypertension Maternal Grandmother    Hypertension Maternal Grandfather    Healthy Brother    Healthy Brother     Social History   Socioeconomic History   Marital status: Married    Spouse name: Not on file   Number of children: Not on file   Years of education: Not on file   Highest education level: Not on file  Occupational History   Occupation: nurse  Tobacco Use   Smoking status: Never   Smokeless tobacco: Never  Vaping Use  Vaping Use: Never used  Substance and Sexual Activity   Alcohol use: Yes    Comment: "I just do it"   Drug use: Never   Sexual activity: Yes    Birth control/protection: None  Other Topics Concern   Not on file  Social History  Narrative   Not on file   Social Determinants of Health   Financial Resource Strain: Not on file  Food Insecurity: Not on file  Transportation Needs: Not on file  Physical Activity: Not on file  Stress: Not on file  Social Connections: Not on file  Intimate Partner Violence: Not on file    Review of Systems: Pertinent positive and negative review of systems were noted in the above HPI section.  All other review of systems was otherwise negative.   Physical Exam: Vital signs in last 24 hours: Temp:  [97.8 F (36.6 C)-98.3 F (36.8 C)] 97.8 F (36.6 C) (11/15 0845) Pulse Rate:  [111-137] 119 (11/15 0855) Resp:  [13-22] 16 (11/15 0855) BP: (103-138)/(53-84) 138/84 (11/15 0855) SpO2:  [96 %-100 %] 100 % (11/15 0855) Weight:  [63.5 kg] 63.5 kg (11/15 0528)   General:   Alert,  Well-developed, well-nourished, young AA female pleasant and cooperative in NAD- tremulous Head:  Normocephalic and atraumatic. Eyes:  Sclera clear, no icterus.   Conjunctiva pink. Ears:  Normal auditory acuity. Nose:  No deformity, discharge,  or lesions. Mouth:  No deformity or lesions.   Neck:  Supple; no masses or thyromegaly. Lungs:  Clear throughout to auscultation.   No wheezes, crackles, or rhonchi. Heart: tachy Regular rate and rhythm; no murmurs, clicks, rubs,  or gallops. Abdomen:  Soft,nontender, BS active,nonpalp mass or hsm, appreciable fluid wave Rectal: not done  Msk:  Symmetrical without gross deformities. . Pulses:  Normal pulses noted. Extremities:  Without clubbing or edema. Neurologic:  Alert and  oriented x4;  grossly normal neurologically.  Tremulous no asterixis Skin:  Intact without significant lesions or rashes.. Psych:  Alert and cooperative. Normal mood and affect.  Intake/Output from previous day: 11/14 0701 - 11/15 0700 In: 100 [IV Piggyback:100] Out: -  Intake/Output this shift: Total I/O In: 636.5 [Blood:389; IV Piggyback:247.5] Out: -   Lab Results: Recent Labs     07/25/22 0547 07/25/22 0613  WBC 6.7  --   HGB 9.7* 11.9*  HCT 33.7* 35.0*  PLT 76*  --    BMET Recent Labs    07/25/22 0547 07/25/22 0613  NA 145 148*  K 2.8* 5.0  CL 107 109  CO2 20*  --   GLUCOSE 122* 127*  BUN 5* <3*  CREATININE 0.63 0.80  CALCIUM 7.9*  --    LFT Recent Labs    07/25/22 0547  PROT 10.2*  ALBUMIN 3.4*  AST 157*  ALT 14  ALKPHOS 243*  BILITOT 3.3*   PT/INR Recent Labs    07/25/22 0547  LABPROT 19.1*  INR 1.6*   Hepatitis Panel No results for input(s): "HEPBSAG", "HCVAB", "HEPAIGM", "HEPBIGM" in the last 72 hours.   IMPRESSION:  #88 41 year old African-American female with severe alcohol abuse disorder, EtOH induced cirrhosis, history of hepatic encephalopathy and repeated admissions with EtOH withdrawal who presents with onset of hematemesis at home yesterday after several days of not eating but continuing to consume a gallon of vodka per day. She was not hypotensive on arrival and initial hemoglobin 9.7 which is higher than her baseline, BUN normal-repeat hemoglobin 11.9 She just had EGD 06/26/2022 with coffee-ground emesis and  was found to have small grade 1 esophageal varices/1 column 1 small cratered esophageal ulcer measuring 4 mm with no stigmata of active bleeding felt to be pressure related from core track and moderate portal gastropathy.  She does not appear to be having a massive upper GI bleed at this time, has very recently documented just 1 small column of varices making variceal hemorrhage less likely though possible.  Rule out Mallory-Weiss tear, rule out bleed secondary to previously noted esophageal ulcer and/or portal gastropathy  M ELD NA= [place] M ELD 3.0= 17  #2 coagulopathy secondary to cirrhosis #3 history of hepatic encephalopathy markedly elevated venous ammonia but mentating normally at present #4 EtOH withdrawal-already exhibiting signs and symptoms  #5 EtOH hepatitis #6 hypokalemia corrected  PLAN: Keep  n.p.o. patient to be admitted to the ICU, critical care following Agree with octreotide infusion Okay to continue PPI infusion until EGD results She is to  receive 2 units of FFP Hold on transfusions at present and repeat hemoglobin in 2 hours then every 6 hours IV Rocephin  Patient has been started on phenobarb for EtOH withdrawal Lactulose enemas ordered  Patient will have EGD with Dr. Stefani Dama Roddy this afternoon, likely at bedside with moderate sedation.  GI will follow with you     PA-C 07/25/2022, 9:07 AM

## 2022-07-25 NOTE — ED Notes (Signed)
Increased RBC infusion to 500/hr pt denies itchiness or pain at iv site

## 2022-07-25 NOTE — Progress Notes (Signed)
Patient seen and examined in the ED. Verbally consented for blood and CVC if needed. Last drink yesterday AM. Vomiting since yesterday AM. Melena for the past few days. H/o Dts with seizures. Surrogate decision maker is husband Patent attorney. Has 3 PIVs.  BP 121/77   Pulse (!) 115   Temp 98.3 F (36.8 C) (Oral)   Resp 16   Ht 5' 4"  (1.626 m)   Wt 63.5 kg   SpO2 97%   BMI 24.03 kg/m  Lying in bed in NAD Not tremulous or agitated, answering questions appropriately Tachycardic, reg rhythm Breathing comfortably on RA, CTAB Mild abdominal tenderness to palpation, no obvious ascites No edema  Plan: 2 units pRBCs, 2 FFP, 2 g Ca+, TXA, PPI, octreotide. ceftriaxone [facility] contacted by ED.  Full consult note to follow.  Julian Hy, DO 07/25/22 7:52 AM [facility]

## 2022-07-25 NOTE — ED Notes (Signed)
EDP Ralene Bathe notified of critical I-stat chem 8 results

## 2022-07-26 ENCOUNTER — Inpatient Hospital Stay (HOSPITAL_COMMUNITY): Payer: Medicaid Other

## 2022-07-26 ENCOUNTER — Inpatient Hospital Stay (HOSPITAL_COMMUNITY): Payer: Medicaid Other | Admitting: Certified Registered"

## 2022-07-26 ENCOUNTER — Inpatient Hospital Stay: Payer: Self-pay

## 2022-07-26 DIAGNOSIS — K920 Hematemesis: Secondary | ICD-10-CM | POA: Diagnosis not present

## 2022-07-26 DIAGNOSIS — Z9911 Dependence on respirator [ventilator] status: Secondary | ICD-10-CM | POA: Diagnosis not present

## 2022-07-26 DIAGNOSIS — K7290 Hepatic failure, unspecified without coma: Secondary | ICD-10-CM

## 2022-07-26 DIAGNOSIS — K7030 Alcoholic cirrhosis of liver without ascites: Secondary | ICD-10-CM | POA: Diagnosis not present

## 2022-07-26 DIAGNOSIS — D689 Coagulation defect, unspecified: Secondary | ICD-10-CM

## 2022-07-26 DIAGNOSIS — D62 Acute posthemorrhagic anemia: Secondary | ICD-10-CM | POA: Diagnosis not present

## 2022-07-26 DIAGNOSIS — D696 Thrombocytopenia, unspecified: Secondary | ICD-10-CM | POA: Diagnosis not present

## 2022-07-26 DIAGNOSIS — I8511 Secondary esophageal varices with bleeding: Secondary | ICD-10-CM | POA: Diagnosis not present

## 2022-07-26 DIAGNOSIS — K7460 Unspecified cirrhosis of liver: Secondary | ICD-10-CM

## 2022-07-26 DIAGNOSIS — K709 Alcoholic liver disease, unspecified: Secondary | ICD-10-CM | POA: Diagnosis not present

## 2022-07-26 DIAGNOSIS — K922 Gastrointestinal hemorrhage, unspecified: Secondary | ICD-10-CM | POA: Diagnosis not present

## 2022-07-26 DIAGNOSIS — F10239 Alcohol dependence with withdrawal, unspecified: Secondary | ICD-10-CM

## 2022-07-26 LAB — COMPREHENSIVE METABOLIC PANEL
ALT: 11 U/L (ref 0–44)
ALT: 13 U/L (ref 0–44)
AST: 100 U/L — ABNORMAL HIGH (ref 15–41)
AST: 120 U/L — ABNORMAL HIGH (ref 15–41)
Albumin: 2.8 g/dL — ABNORMAL LOW (ref 3.5–5.0)
Albumin: 2.7 g/dL — ABNORMAL LOW (ref 3.5–5.0)
Alkaline Phosphatase: 173 U/L — ABNORMAL HIGH (ref 38–126)
Alkaline Phosphatase: 164 U/L — ABNORMAL HIGH (ref 38–126)
Anion gap: 15 (ref 5–15)
Anion gap: 7 (ref 5–15)
BUN: <5 mg/dL — ABNORMAL LOW (ref 6–20)
BUN: <5 mg/dL — ABNORMAL LOW (ref 6–20)
CO2: 18 mmol/L — ABNORMAL LOW (ref 22–32)
CO2: 21 mmol/L — ABNORMAL LOW (ref 22–32)
Calcium: 7.8 mg/dL — ABNORMAL LOW (ref 8.9–10.3)
Calcium: 7.4 mg/dL — ABNORMAL LOW (ref 8.9–10.3)
Chloride: 112 mmol/L — ABNORMAL HIGH (ref 98–111)
Chloride: 112 mmol/L — ABNORMAL HIGH (ref 98–111)
Creatinine, Ser: 0.85 mg/dL (ref 0.44–1.00)
Creatinine, Ser: 0.72 mg/dL (ref 0.44–1.00)
GFR, Estimated: >60 mL/min (ref >60)
GFR, Estimated: >60 mL/min (ref >60)
Glucose, Bld: 94 mg/dL (ref 70–99)
Glucose, Bld: 139 mg/dL — ABNORMAL HIGH (ref 70–99)
Potassium: 3.3 mmol/L — ABNORMAL LOW (ref 3.5–5.1)
Potassium: 3.4 mmol/L — ABNORMAL LOW (ref 3.5–5.1)
Sodium: 145 mmol/L (ref 135–145)
Sodium: 140 mmol/L (ref 135–145)
Total Bilirubin: 3.8 mg/dL — ABNORMAL HIGH (ref 0.3–1.2)
Total Bilirubin: 3.8 mg/dL — ABNORMAL HIGH (ref 0.3–1.2)
Total Protein: 7.8 g/dL (ref 6.5–8.1)
Total Protein: 7.2 g/dL (ref 6.5–8.1)

## 2022-07-26 LAB — PREPARE FRESH FROZEN PLASMA: Unit division: 0

## 2022-07-26 LAB — CBC
HCT: 29.5 % — ABNORMAL LOW (ref 36.0–46.0)
HCT: 30.5 % — ABNORMAL LOW (ref 36.0–46.0)
HCT: 31.3 % — ABNORMAL LOW (ref 36.0–46.0)
Hemoglobin: 8.9 g/dL — ABNORMAL LOW (ref 12.0–15.0)
Hemoglobin: 9.2 g/dL — ABNORMAL LOW (ref 12.0–15.0)
Hemoglobin: 9.3 g/dL — ABNORMAL LOW (ref 12.0–15.0)
MCH: 27.3 pg (ref 26.0–34.0)
MCH: 27.6 pg (ref 26.0–34.0)
MCH: 28 pg (ref 26.0–34.0)
MCHC: 29.4 g/dL — ABNORMAL LOW (ref 30.0–36.0)
MCHC: 30.2 g/dL (ref 30.0–36.0)
MCHC: 30.5 g/dL (ref 30.0–36.0)
MCV: 91.3 fL (ref 80.0–100.0)
MCV: 91.9 fL (ref 80.0–100.0)
MCV: 92.9 fL (ref 80.0–100.0)
Platelets: 23 10*3/uL — CL (ref 150–400)
Platelets: 25 10*3/uL — CL (ref 150–400)
Platelets: 34 10*3/uL — ABNORMAL LOW (ref 150–400)
RBC: 3.23 MIL/uL — ABNORMAL LOW (ref 3.87–5.11)
RBC: 3.32 MIL/uL — ABNORMAL LOW (ref 3.87–5.11)
RBC: 3.37 MIL/uL — ABNORMAL LOW (ref 3.87–5.11)
RDW: 21.1 % — ABNORMAL HIGH (ref 11.5–15.5)
RDW: 21.2 % — ABNORMAL HIGH (ref 11.5–15.5)
RDW: 21.4 % — ABNORMAL HIGH (ref 11.5–15.5)
WBC: 2.4 10*3/uL — ABNORMAL LOW (ref 4.0–10.5)
WBC: 5 10*3/uL (ref 4.0–10.5)
WBC: 6.1 10*3/uL (ref 4.0–10.5)
nRBC: 0 % (ref 0.0–0.2)
nRBC: 0 % (ref 0.0–0.2)
nRBC: 1.3 % — ABNORMAL HIGH (ref 0.0–0.2)

## 2022-07-26 LAB — AMMONIA: Ammonia: 166 umol/L — ABNORMAL HIGH (ref 9–35)

## 2022-07-26 LAB — URINALYSIS, ROUTINE W REFLEX MICROSCOPIC
Bacteria, UA: NONE SEEN
Glucose, UA: NEGATIVE mg/dL
Hgb urine dipstick: NEGATIVE
Ketones, ur: NEGATIVE mg/dL
Leukocytes,Ua: NEGATIVE
Nitrite: NEGATIVE
Protein, ur: 30 mg/dL — AB
Specific Gravity, Urine: 1.017 (ref 1.005–1.030)
pH: 6 (ref 5.0–8.0)

## 2022-07-26 LAB — GLUCOSE, CAPILLARY
Glucose-Capillary: 108 mg/dL — ABNORMAL HIGH (ref 70–99)
Glucose-Capillary: 124 mg/dL — ABNORMAL HIGH (ref 70–99)
Glucose-Capillary: 128 mg/dL — ABNORMAL HIGH (ref 70–99)
Glucose-Capillary: 140 mg/dL — ABNORMAL HIGH (ref 70–99)
Glucose-Capillary: 144 mg/dL — ABNORMAL HIGH (ref 70–99)
Glucose-Capillary: 148 mg/dL — ABNORMAL HIGH (ref 70–99)
Glucose-Capillary: 152 mg/dL — ABNORMAL HIGH (ref 70–99)
Glucose-Capillary: 62 mg/dL — ABNORMAL LOW (ref 70–99)
Glucose-Capillary: 92 mg/dL (ref 70–99)

## 2022-07-26 LAB — BPAM PLATELET PHERESIS
Blood Product Expiration Date: 202311182359
ISSUE DATE / TIME: 202311152243
Unit Type and Rh: 7300

## 2022-07-26 LAB — BPAM FFP
Blood Product Expiration Date: 202311202359
Blood Product Expiration Date: 202311202359
ISSUE DATE / TIME: 202311150816
ISSUE DATE / TIME: 202311150836
Unit Type and Rh: 5100
Unit Type and Rh: 5100

## 2022-07-26 LAB — DIC (DISSEMINATED INTRAVASCULAR COAGULATION)PANEL
D-Dimer, Quant: 2.82 ug/mL-FEU — ABNORMAL HIGH (ref 0.00–0.50)
Fibrinogen: 202 mg/dL — ABNORMAL LOW (ref 210–475)
INR: 1.6 — ABNORMAL HIGH (ref 0.8–1.2)
Platelets: 25 10*3/uL — CL (ref 150–400)
Prothrombin Time: 19.2 seconds — ABNORMAL HIGH (ref 11.4–15.2)
Smear Review: NONE SEEN
aPTT: 33 seconds (ref 24–36)

## 2022-07-26 LAB — HEMOGLOBIN AND HEMATOCRIT, BLOOD
HCT: 21.8 % — ABNORMAL LOW (ref 36.0–46.0)
HCT: 30.1 % — ABNORMAL LOW (ref 36.0–46.0)
Hemoglobin: 6.7 g/dL — CL (ref 12.0–15.0)
Hemoglobin: 8.7 g/dL — ABNORMAL LOW (ref 12.0–15.0)

## 2022-07-26 LAB — PREPARE PLATELET PHERESIS: Unit division: 0

## 2022-07-26 LAB — PREPARE RBC (CROSSMATCH)

## 2022-07-26 LAB — PROTIME-INR
INR: 1.6 — ABNORMAL HIGH (ref 0.8–1.2)
Prothrombin Time: 18.6 seconds — ABNORMAL HIGH (ref 11.4–15.2)

## 2022-07-26 LAB — MAGNESIUM: Magnesium: 1.5 mg/dL — ABNORMAL LOW (ref 1.7–2.4)

## 2022-07-26 LAB — PHOSPHORUS: Phosphorus: 1.5 mg/dL — ABNORMAL LOW (ref 2.5–4.6)

## 2022-07-26 LAB — TRIGLYCERIDES: Triglycerides: 283 mg/dL — ABNORMAL HIGH (ref <150)

## 2022-07-26 MED ORDER — VITAMIN K1 10 MG/ML IJ SOLN
5.0000 mg | Freq: Once | INTRAVENOUS | Status: AC
  Administered 2022-07-26: 5 mg via INTRAVENOUS
  Filled 2022-07-26: qty 0.5

## 2022-07-26 MED ORDER — LACTULOSE ENEMA
300.0000 mL | ORAL | Status: DC
  Administered 2022-07-26 – 2022-07-30 (×20): 300 mL via RECTAL
  Filled 2022-07-26 (×23): qty 300

## 2022-07-26 MED ORDER — ORAL CARE MOUTH RINSE
15.0000 mL | OROMUCOSAL | Status: DC
  Administered 2022-07-26 – 2022-08-06 (×132): 15 mL via OROMUCOSAL

## 2022-07-26 MED ORDER — TRANEXAMIC ACID-NACL 1000-0.7 MG/100ML-% IV SOLN
1000.0000 mg | Freq: Once | INTRAVENOUS | Status: AC
  Administered 2022-07-26: 1000 mg via INTRAVENOUS
  Filled 2022-07-26: qty 100

## 2022-07-26 MED ORDER — POTASSIUM CHLORIDE 10 MEQ/100ML IV SOLN
10.0000 meq | INTRAVENOUS | Status: AC
  Administered 2022-07-26 (×4): 10 meq via INTRAVENOUS
  Filled 2022-07-26 (×4): qty 100

## 2022-07-26 MED ORDER — SODIUM CHLORIDE 0.9 % IV SOLN
INTRAVENOUS | Status: DC | PRN
  Administered 2022-08-01: 10 mL/h via INTRAVENOUS

## 2022-07-26 MED ORDER — POTASSIUM CHLORIDE 10 MEQ/100ML IV SOLN
10.0000 meq | INTRAVENOUS | Status: AC
  Administered 2022-07-26 (×6): 10 meq via INTRAVENOUS
  Filled 2022-07-26 (×6): qty 100

## 2022-07-26 MED ORDER — SODIUM CHLORIDE 0.9% IV SOLUTION: Freq: Once | INTRAVENOUS | Status: AC

## 2022-07-26 MED ORDER — THIAMINE HCL 100 MG/ML IJ SOLN
250.0000 mg | Freq: Every day | INTRAVENOUS | Status: AC
  Administered 2022-07-28 – 2022-08-01 (×5): 250 mg via INTRAVENOUS
  Filled 2022-07-26 (×5): qty 2.5

## 2022-07-26 MED ORDER — DEXTROSE 50 % IV SOLN
INTRAVENOUS | Status: AC
  Administered 2022-07-26: 25 mL
  Filled 2022-07-26: qty 50

## 2022-07-26 MED ORDER — POTASSIUM PHOSPHATES 15 MMOLE/5ML IV SOLN
30.0000 mmol | Freq: Once | INTRAVENOUS | Status: AC
  Administered 2022-07-26: 30 mmol via INTRAVENOUS
  Filled 2022-07-26: qty 10

## 2022-07-26 MED ORDER — METRONIDAZOLE 500 MG/100ML IV SOLN
500.0000 mg | Freq: Two times a day (BID) | INTRAVENOUS | Status: DC
  Administered 2022-07-26 – 2022-07-29 (×6): 500 mg via INTRAVENOUS
  Filled 2022-07-26 (×6): qty 100

## 2022-07-26 MED ORDER — MAGNESIUM SULFATE 4 GM/100ML IV SOLN
4.0000 g | Freq: Once | INTRAVENOUS | Status: AC
  Administered 2022-07-26: 4 g via INTRAVENOUS
  Filled 2022-07-26: qty 100

## 2022-07-26 MED ORDER — "THROMBI-PAD 3""X3"" EX PADS"
2.0000 | MEDICATED_PAD | Freq: Once | CUTANEOUS | Status: AC
  Administered 2022-07-26: 2 via TOPICAL
  Filled 2022-07-26: qty 2

## 2022-07-26 MED ORDER — CALCIUM GLUCONATE-NACL 1-0.675 GM/50ML-% IV SOLN
1.0000 g | Freq: Once | INTRAVENOUS | Status: AC
  Administered 2022-07-26: 1000 mg via INTRAVENOUS
  Filled 2022-07-26: qty 50

## 2022-07-26 MED ORDER — NOREPINEPHRINE 4 MG/250ML-% IV SOLN
0.0000 ug/min | INTRAVENOUS | Status: DC
  Administered 2022-07-26: 2 ug/min via INTRAVENOUS
  Administered 2022-07-27 (×2): 5 ug/min via INTRAVENOUS
  Filled 2022-07-26 (×3): qty 250

## 2022-07-26 MED ORDER — METOCLOPRAMIDE HCL 5 MG/ML IJ SOLN
5.0000 mg | Freq: Once | INTRAMUSCULAR | Status: AC
  Administered 2022-07-26: 5 mg via INTRAVENOUS
  Filled 2022-07-26: qty 2

## 2022-07-26 MED ORDER — OLANZAPINE 10 MG IM SOLR: 10.0000 mg | Freq: Two times a day (BID) | INTRAMUSCULAR | Status: DC

## 2022-07-26 MED ORDER — VANCOMYCIN HCL IN DEXTROSE 1-5 GM/200ML-% IV SOLN
1000.0000 mg | Freq: Two times a day (BID) | INTRAVENOUS | Status: DC
  Administered 2022-07-27 – 2022-07-28 (×3): 1000 mg via INTRAVENOUS
  Filled 2022-07-26 (×3): qty 200

## 2022-07-26 MED ORDER — DEXTROSE 10 % IV SOLN: INTRAVENOUS | Status: DC

## 2022-07-26 MED ORDER — THIAMINE HCL 100 MG/ML IJ SOLN
500.0000 mg | Freq: Three times a day (TID) | INTRAVENOUS | Status: AC
  Administered 2022-07-26 – 2022-07-27 (×6): 500 mg via INTRAVENOUS
  Filled 2022-07-26 (×6): qty 5

## 2022-07-26 MED ORDER — VANCOMYCIN HCL 1500 MG/300ML IV SOLN
1500.0000 mg | Freq: Once | INTRAVENOUS | Status: AC
  Administered 2022-07-26: 1500 mg via INTRAVENOUS
  Filled 2022-07-26: qty 300

## 2022-07-26 MED ORDER — LACTULOSE ENEMA
300.0000 mL | ORAL | Status: DC
  Administered 2022-07-26 (×2): 300 mL via RECTAL
  Filled 2022-07-26 (×4): qty 300

## 2022-07-26 NOTE — Progress Notes (Signed)
  BP continues to be borderline, remains ST in the 120-130 Propofol 20, fentanyl gtt 150, remains non- responsive  CTH neg Tmax 100.4 Hgb remains stable, WBC down 2.4 DIC panel sent while still getting clotting factors replaced  Suspect she is developing sepsis No issues with oxygenation at this time or secretions  P:  - Broaden abx> add flagyl/ vanc to ceftriaxone, avoiding cefepime given neuro concerns and zosyn given risk of AKI  - follow [facility] sent 11/15 - check UA, insert temp foley  - no secretions to send for culture - pending EEG ([place])  - NE prn for MAP goal > 65 - CXR in am  - monitor for bleeding/ trend H/H closely    Called and updated husband, aware she is critically ill. Remains full code.       Kennieth Rad, MSN, AG-ACNP-BC [facility] 07/26/2022, 6:41 PM  See Amion for pager If no response to pager, please call PCCM consult pager After 7:00 pm call Elink

## 2022-07-26 NOTE — Progress Notes (Addendum)
NAME:  Joann [name], MRN:  [id number], DOB:  Jan 12, 1981, LOS: 1 ADMISSION DATE:  07/25/2022, CONSULTATION DATE:  07/25/2022 REFERRING MD:  Dr. Ralene Bathe, CHIEF COMPLAINT:  hematemesis    History of Present Illness:  41 year old female with PMH significant for current ETOH abuse, ETOH cirrhosis, DTs w/seizures, hepatic encephalopathy, HTN, and anxiety presenting to [facility] ER with complaints of nausea with multiple episodes of hematemesis since Tuesday morning with RUQ abdominal, weakness, unable to eat in three days, and several days of dark stools. Drinks a gallon of vodka daily, last drink was 11/14.  Hx of seizures when she tries to quit.  Multiple episodes of small dark bloody emesis in ER.  Hgb 9.7, INR 1.6, plts 76.   Admitted to PCCM.  Given FFP, PRBC, and txa.  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    Pertinent  Medical History  ETOH abuse, ETOH cirrhosis with grade 1 esophageal varices, portal HTN gastropathy, DTs w/seizures, hepatic encephalopathy, HTN, anxiety  Previously f/b [facility] Events: Including procedures, antibiotic start and stop dates in addition to other pertinent events   11/15 Admitted hematemesis, intubated/ EGD> esophageal varices s/p 6 bands  Interim History / Subjective:  Difficult to sedate at times, requiring propofol 80, fentanyl 300, and prn ativan overnight, gags and bites ETT when sedation lowered  Hypoglycemic episode overnight> now on D10 Urinary retention overnight Failed SBT this am> too sedate No further evidence of bleeding, Hgb stable, plts down 25  Objective   Blood pressure 117/79, pulse (!) 119, temperature 99 F (37.2 C), temperature source Axillary, resp. rate (!) 22, height _0  (1.626 m), weight 71 kg, SpO2 99 %.    Vent Mode: PRVC FiO2 (%):  [30 %-40 %] 30 % Set Rate:  [22 bmp] 22 bmp Vt Set:   [440 mL] 440 mL PEEP:  [5 cmH20] 5 cmH20 Pressure Support:  [10 cmH20] 10 cmH20 Plateau Pressure:  [16 cmH20-32 cmH20] 32 cmH20   Intake/Output Summary (Last 24 hours) at 07/26/2022 0922 Last data filed at 07/26/2022 0805 Gross per 24 hour  Intake 4314.81 ml  Output 2280 ml  Net 2034.81 ml    Filed Weights   07/25/22 0528 07/26/22 0449  Weight: 63.5 kg 71 kg   Examination: Propofol 80>40 Fentanyl 300> 150 General:  adult female lying in bed in NAD HEENT: MM pink/moist, ETT with bite block in place, pupils 2/constricted, anicteric, no OGT Neuro: sedated, does not f/c or move spont.  Intermittent stiffening in lower extremities when stimulated  CV: rr, ST, no murmur PULM:  MV supported breaths, coarse R> L no wheeze, no secretions GI: soft, bs+, ND, purwick, FMS Extremities: warm/dry, no LE edema  Skin: no rashes   UOP 1.4L/ 24hrs Net +2.2L Afebrile  Labs K 3.4, bicarb 21, sCr 0.72, phos 1.5, alk phos 164, AST 120, t. Bili 3.8, Hgb/ Hct 9.3/ 8.9, plts 76> 38> 25  Resolved Hospital Problem list    Assessment & Plan:   Upper GIB 2/2 bleeding esophageal varices and duodenum lesion ETOH cirrhosis ETOH abuse, ongoing, 1 gallon of vodka daily, last drink 11/14 Portal hypertensive gastropathy  Hepatic encephalopathy  - hepatitis discriminant score 26.3, steroids deferred - MELD score 16, 6% of 3 month mortality  - EGD 11/15 by Dr. Rush Landmark Grade II and III esophageal varices w/ signs of recent bleeding s/p  6 bands, portal hypertensive gastropathy, single bleeding angiodysplastic lesion in duodenum s/p APC and clip  (prior EGD 06/26/22 showed one single column of grade I esophageal varices  Plan:  - Greatly appreciate assistance from [facility] - cont PPI drip for total of 72 hrs then transition to BID - cont IV octreotide for 72 hrs - stop date placed by pharmacy - ammonia 140> 166 despite lactulose enemas, increase q6> q4hrs - repeat ammonia in am  - will need to  consider nutrition if unable to liberate from MV soon, TPN vs dobhoff (very high risk given banding) - trending Hgb, remains stable post banding - cont ceftriaxone for SBP ppx  - recs for repeat EGD in 4 weeks  Acute respiratory insufficiency related to above  - intubated for EGD 11/15 and left on MV overnight to monitor for rebleeding of banded varices  -  Cont MV support, 4-8cc/kg IBW with goal Pplat <30 and DP<15  - decrease rate to 16 - VAP prevention protocol/ PPI  - PAD protocol for sedation> propofol/ fentanyl gtt for RASS goal 0/-1.  Agitation/ biting of ETT has limited weaning of sedation.  Limited given no enteral access for meds - after plt transfusion, will give zyprexa 31m IM BID - wean FiO2 as able for SpO2 >92%  - daily SAT & SBT - intermittent CXR/ ABG  SIRS - likely multifactorial related to ABLA/ GIB as above and ETOH w/d but need to rule out sepsis component - remains hemodynamically stable  - follow blood cultures - empiric ceftriaxone as above  - trend fever curve/ WBC  Acute on chronic Anemia  Coagulapathy - acute on chronic thrombocytopenia, elevated INR - 2 units FFP now with TXA f/b 2 units PRBC 11/15 P:  - INR 1.6, will give vit K 556mx 1 - transfuse pack of platelets and monitor for bleeding  - H/H q 12 today, CBC in am  - transfuse for Hgb < 7 or active bleeding  Hypokalemia Hypocalcemia  Hypophos  Hypomagnesia  - corrected calcium 8.4, will give 1gm calcium gluconate  - phos 1.5, K 3.4> IV kphos 30 mmol and KCL x 4 runs - Mag 1.5 s/p 4gm replete this am - trend and cont aggressive electrolyte replacement  Hypoglycemia - likely related to liver dysfunction, critically illness, and NPO - cont D10 gtt, titrate for CBG ~120 - CBGs q4hr - add nutrition when able   High risk for ETOH withdrawal, hx of Dts ETOH abuse - daily 1 gallon of vodka, last drink 11/14 - continue high dose phenobarb taper  (also on propofol) - high dose thiamine x 2  days then 10045maily - cont folate IV - prn ativan q 2 - seizure precautions - will need TOC closer to discharge for options/ resources> ?inpt rehab facility  HTN - hold home coreg    Urinary retention - likely due to sedation.  Hold retention meds for now.  - Bladder scan q4, may need to insert foley  - strict I/Os  At risk for malnutrition  - remains NPO.  Considering TPN vs very high risk dobhoff if unable to liberate from mechanical ventilation  - RD consult.  High risk for refeeding syndrome  PAF - had brief episode of afib 11/15 - AC contraindicated, and likely just related to critical illness  - remains NSR/ ST on telemetry.  Continue to monitor  - keep K> 4, Mag > 2  Best Practice (right click and "Reselect all SmartList Selections"  daily)   Diet/type: NPO DVT prophylaxis: SCD GI prophylaxis: PPI Lines: N/A Foley:  N/A Code Status:  full code Last date of multidisciplinary goals of care discussion [11/15]  Husband, Celesta Gentile [phone] (truck driver)> currently in [place].  Advises if he can not answer phone to leave a message and he would call back.   Terrance updated by phone 11/16.  All questions answered.   Labs   CBC: Recent Labs  Lab 07/25/22 0547 07/25/22 0613 07/25/22 1301 07/25/22 1900 07/25/22 2359 07/26/22 0817  WBC 6.7  --  5.8 6.7 6.1 5.0  NEUTROABS 3.8  --   --   --   --   --   HGB 9.7* 11.9* 9.6* 9.1* 8.9* 9.3*  HCT 33.7* 35.0* 32.8* 30.3* 29.5* 30.5*  MCV 88.0  --  91.9 91.3 91.3 91.9  PLT 76*  --  38* 37* 34* 25*     Basic Metabolic Panel: Recent Labs  Lab 07/25/22 0547 07/25/22 0613 07/25/22 1304 07/25/22 2359  NA 145 148* 147* 145  K 2.8* 5.0 3.6 3.3*  CL 107 109 110 112*  CO2 20*  --  18* 18*  GLUCOSE 122* 127* 87 94  BUN 5* <3* 5* <5*  CREATININE 0.63 0.80 0.58 0.85  CALCIUM 7.9*  --  7.7* 7.8*  MG  --   --   --  1.5*    GFR: Estimated Creatinine Clearance: 82.5 mL/min (by C-G formula based on SCr of 0.85  mg/dL). Recent Labs  Lab 07/25/22 1301 07/25/22 1900 07/25/22 2359 07/26/22 0817  WBC 5.8 6.7 6.1 5.0     Liver Function Tests: Recent Labs  Lab 07/25/22 0547 07/25/22 2359  AST 157* 100*  ALT 14 11  ALKPHOS 243* 173*  BILITOT 3.3* 3.8*  PROT 10.2* 7.8  ALBUMIN 3.4* 2.8*    No results for input(s): "LIPASE", "AMYLASE" in the last 168 hours. Recent Labs  Lab 07/25/22 0657 07/25/22 2359  AMMONIA 140* 166*    ABG    Component Value Date/Time   PHART 7.29 (L) 07/25/2022 1707   PCO2ART 31 (L) 07/25/2022 1707   PO2ART 130 (H) 07/25/2022 1707   HCO3 14.9 (L) 07/25/2022 1707   TCO2 21 (L) 07/25/2022 0613   ACIDBASEDEF 10.6 (H) 07/25/2022 1707   O2SAT 99.4 07/25/2022 1707     Coagulation Profile: Recent Labs  Lab 07/25/22 0547 07/25/22 1301 07/25/22 2359  INR 1.6* 1.7* 1.6*     Cardiac Enzymes: No results for input(s): "CKTOTAL", "CKMB", "CKMBINDEX", "TROPONINI" in the last 168 hours.  HbA1C: Hgb A1c MFr Bld  Date/Time Value Ref Range Status  09/29/2018 03:23 PM 4.8 4.8 - 5.6 % Final    Comment:             Prediabetes: 5.7 - 6.4          Diabetes: >6.4          Glycemic control for adults with diabetes: <7.0     CBG: Recent Labs  Lab 07/25/22 1921 07/26/22 0024 07/26/22 0518 07/26/22 0541 07/26/22 0753  GLUCAP 83 92 62* 128* 124*    Critical care time: 52 mins    Kennieth Rad, MSN, AG-ACNP-BC [facility] 07/26/2022, 9:22 AM  See Amion for pager If no response to pager, please call PCCM consult pager After 7:00 pm call Elink

## 2022-07-26 NOTE — Procedures (Signed)
Central Venous Catheter Insertion Procedure Note  Joann [name]  [id number] Topical only with 1% lidocaine  [birth year]  Date:07/26/22  Time:3:01 PM   Provider Performing:Brooke Moshe Cipro   Procedure: Insertion of Non-tunneled Central Venous [phone]) with US guidance ([id number])   Indication(s) Medication administration and Difficult access  Consent Risks of the procedure as well as the alternatives and risks of each were explained to the patient and/or caregiver.  Consent for the procedure was obtained and is signed in the bedside chart  Anesthesia Topical only with 1% lidocaine   Timeout Verified patient identification, verified procedure, site/side was marked, verified correct patient position, special equipment/implants available, medications/allergies/relevant history reviewed, required imaging and test results available.  Sterile Technique Maximal sterile technique including full sterile barrier drape, hand hygiene, sterile gown, sterile gloves, mask, hair covering, sterile ultrasound probe cover (if used).  Procedure Description Area of catheter insertion was cleaned with chlorhexidine and draped in sterile fashion.  With real-time ultrasound guidance a central venous catheter was placed into the right femoral vein. Nonpulsatile blood flow and easy flushing noted in all ports.  The catheter was sutured in place and sterile dressing applied.    Complications/Tolerance None; patient tolerated the procedure well. Chest X-ray is ordered to verify placement for internal jugular or subclavian cannulation.   Chest x-ray is not ordered for femoral cannulation.  EBL Minimal, slight oozing at site.  Small hematoma slightly above insertion site > pressure manually held by myself with improvement.  RN to monitor site closely.    Specimen(s) None     Kennieth Rad, MSN, AG-ACNP-BC [facility] 07/26/2022, 3:02 PM  See Amion for pager If no response to  pager, please call PCCM consult pager After 7:00 pm call Elink

## 2022-07-26 NOTE — Progress Notes (Signed)
Pharmacy Antibiotic Note  Joann [name] is a 41 y.o. female admitted on 07/25/2022 with sepsis.  Pharmacy has been consulted for Vanco dosing.  ID: Per GI - Continue Ceftriaxone daily x 5-days (can transition to other medications that are not IV after 3-days if necessary). R/o sepsis - Tmax 100.4, WBC 2.4, Scr <1  11/15 Ceftriaxone >>  11/16 Vanco>> 11/16 Flagyl>>  11/15 Bcx: 11/15 MRSA PCR: neg  Plan: Vanco 1513m IV x 1 then Vancomycin 1000 mg IV Q 12 hrs. Goal AUC 400-550. Expected AUC: 493 SCr used: 0.8    Height: 5' 4"  (162.6 cm) Weight: 71 kg (156 lb 8.4 oz) IBW/kg (Calculated) : 54.7  Temp (24hrs), Avg:99.2 F (37.3 C), Min:97.8 F (36.6 C), Max:100.4 F (38 C)  Recent Labs  Lab 07/25/22 0547 07/25/22 0613 07/25/22 1301 07/25/22 1304 07/25/22 1900 07/25/22 2359 07/26/22 0817 07/26/22 0959 07/26/22 1413  WBC 6.7  --  5.8  --  6.7 6.1 5.0  --  2.4*  CREATININE 0.63 0.80  --  0.58  --  0.85  --  0.72  --     Estimated Creatinine Clearance: 87.6 mL/min (by C-G formula based on SCr of 0.72 mg/dL).    Allergies  Allergen Reactions   Latex Hives     Jordon Kristiansen S. RAlford Highland PharmD, BCPS Clinical Staff Pharmacist [email or web address] RWayland Salinas11/16/2023 6:41 PM

## 2022-07-26 NOTE — Progress Notes (Signed)
[facility] ADULT ICU REPLACEMENT PROTOCOL   The patient does apply for the [facility] Adult ICU Electrolyte Replacment Protocol based on the criteria listed below:   1.Exclusion criteria: TCTS, ECMO, Dialysis, and Myasthenia Gravis patients 2. Is GFR >/= 30 ml/min? Yes.    Patient's GFR today is >60 3. Is SCr </= 2? Yes.   Patient's SCr is 0.85 mg/dL 4. Did SCr increase >/= 0.5 in 24 hours? No. 5.Pt's weight >40kg  Yes.   6. Abnormal electrolyte(s): K+ 3.3, Mag 1.5  7. Electrolytes replaced per protocol 8.  Call MD STAT for K+ </= 2.5, Phos </= 1, or Mag </= 1 Physician:  Nicola Police D [facility] 07/26/2022 1:28 AM

## 2022-07-26 NOTE — Progress Notes (Signed)
eLink Physician-Brief Progress Note Patient Name: Joann [name] DOB: 11/26/1980 MRN: [id number]   Date of Service  07/26/2022  HPI/Events of Note  Urinary retention - Bladder scan with 700 mL.   eICU Interventions  Plan: I/O Cath PRN.      Intervention Category Major Interventions: Other:  Lysle Dingwall 07/26/2022, 1:56 AM

## 2022-07-26 NOTE — Progress Notes (Signed)
[facility] Progress Note Patient Name: Joann [name] DOB: 1981/05/29 MRN: [id number]   Date of Service  07/26/2022  HPI/Events of Note  Agitation - Patient biting ETT until she desatted. She is maxed on a Propofol IV infusion at 80 mcg/kg/min and a Fentanyl IV infusion at 200 mcg/hour & has received many PRN boluses. RT is placing bite block.  eICU Interventions  Plan: Increase ceiling on Fentanyl IV infusion to 400 mcg/hour. Titrate to RASS = 0 to -1.      Intervention Category Major Interventions: Delirium, psychosis, severe agitation - evaluation and management  Armondo Cech [place] 07/26/2022, 1:17 AM

## 2022-07-26 NOTE — Progress Notes (Signed)
Daily Progress Note  Hospital Day: 2  Chief Complaint: hematemesis  Brief History [name] with a pmh not limited to Etoh abuse, Etoh cirrhosis, IDA.  Seen in consultation by [place] on 11/15 for hematemesis  Assessment    # 41 yo female with Etoh abuse, cirrhosis complicated by portal HTN. MELD 16.  Admitted with recurrent hematemesis.  Esophageal varices with recent bleeding on EGD. We recommended she remain intubated overnight due to high risk for rebleeding.   Markedly elevated ammonia. Getting lactulose enemas ( so far got one last night and due for another now). PCCM has increased frequency to Q 4 hours  # Hematemesis / Grade II and grade III esophageal varices with stigmata of recent bleeding, s/p banding.  Also, a single bleeding angiodysplastic lesion in the duodenum s/p APC and clip No further overt GI bleeding Got 2 u FFP Hgb improved from 6.6 to 9.3 post 2 u PRBC  # Delirium, probably multifactorial. Has been agitated, hard to sedate.  Zyprexa worked for her in the past. PCCM giving platelets so they can give injection of Zyrexa  # Multiple electrolyte abnormalities, repletion in progress.   # Etoh withdrawal. On pb  Plan   Continue IV PPI drip for 72 hours (until Sat morning) Continue IV Octreotide drip for at least 72 hours ( until Sat morning). On Day #2 of Ceftriaxone. Continue daily x 5-days (can transition to other medications that are not      IV after 3-days if necessary). Lactulose enemas Q4 in progres Repeat EGD in 4-weeks    Objective   Endoscopic studies:   07/25/22 EGD -No gross lesions in the proximal esophagus and in the mid esophagus. - Grade II and grade III esophageal varices with stigmata of recent bleeding found in the distal esophagus. Completely eradicated. Banded. - Z-line irregular, 39 cm from the incisors. - 1 cm hiatal hernia. - Portal hypertensive gastropathy throughout. - A single bleeding angiodysplastic lesion in the  duodenum. Treated with argon plasma coagulation (APC). Clip (MR conditional) was placed. Clip manufacturer: [employer]. - No gross lesions in the duodenal bulb, in the first portion of the duodenum and in the second portion of the duodenum.    Imaging:  DG Chest Port 1 View  Result Date: 07/25/2022 CLINICAL DATA:  Intubation EXAM: PORTABLE CHEST 1 VIEW COMPARISON:  07/25/2022 at 0549 hours FINDINGS: Interval placement of endotracheal tube with distal tip terminating approximately 2.8 cm above the carina. Heart size within normal limits. Slightly low lung volumes. No focal airspace consolidation, pleural effusion, or pneumothorax. IMPRESSION: Interval placement of endotracheal tube with distal tip terminating approximately 2.8 cm above the carina. Electronically Signed   By: Joann Poke JoyceO.   On: 07/25/2022 16:21   DG Abd 1 View  Result Date: 07/25/2022 CLINICAL DATA:  41 year old female with abdominal pain and hematemesis. EXAM: ABDOMEN - 1 VIEW COMPARISON:  Abdominal radiographs 06/26/2022 and earlier. FINDINGS: Portable AP supine view at 1013 hours. Non obstructed bowel gas pattern. Small volume gas in fairly decompressed stomach. No pneumoperitoneum is evident on these supine views. Lung bases appear negative. Stable cholecystectomy clips. Other abdominal and pelvic visceral contours are stable and within normal limits. Chronic pelvic phleboliths. No acute osseous abnormality identified. IMPRESSION: 1. Normal bowel gas pattern.  Negative visible lung bases. 2. Note history of cirrhosis and consider variceal bleeding in the setting of hematemesis. Electronically Signed   By: Joann Ann M.D.   On: 07/25/2022 09:30  DG Chest Port 1 View  Result Date: 07/25/2022 CLINICAL DATA:  41 year old female under screening evaluation. EXAM: PORTABLE CHEST 1 VIEW COMPARISON:  Chest x-ray 06/25/2022. FINDINGS: Lung volumes are normal. No consolidative airspace disease. No pleural effusions. No  pneumothorax. No pulmonary nodule or mass noted. Pulmonary vasculature and the cardiomediastinal silhouette are within normal limits. IMPRESSION: No radiographic evidence of acute cardiopulmonary disease. Electronically Signed   By: Vinnie Langton M.D.   On: 07/25/2022 06:05    Lab Results: Recent Labs    07/25/22 1900 07/25/22 2359 07/26/22 0817  WBC 6.7 6.1 5.0  HGB 9.1* 8.9* 9.3*  HCT 30.3* 29.5* 30.5*  PLT 37* 34* 25*   BMET Recent Labs    07/25/22 1304 07/25/22 2359 07/26/22 0959  NA 147* 145 140  K 3.6 3.3* 3.4*  CL 110 112* 112*  CO2 18* 18* 21*  GLUCOSE 87 94 139*  BUN 5* <5* <5*  CREATININE 0.58 0.85 0.72  CALCIUM 7.7* 7.8* 7.4*   LFT Recent Labs    07/26/22 0959  PROT 7.2  ALBUMIN 2.7*  AST 120*  ALT 13  ALKPHOS 164*  BILITOT 3.8*   PT/INR Recent Labs    07/25/22 1301 07/25/22 2359  LABPROT 19.4* 18.6*  INR 1.7* 1.6*     Scheduled inpatient medications:   Chlorhexidine Gluconate Cloth  6 each Topical Daily   folic acid  1 mg Intravenous Daily   lactulose  300 mL Rectal Q4H   mouth rinse  15 mL Mouth Rinse Q2H   [START ON 07/28/2022] pantoprazole  40 mg Intravenous Q12H   PHENObarbital  97.5 mg Intravenous Q8H   Followed by   [START ON 07/27/2022] PHENObarbital  65 mg Intravenous Q8H   Followed by   [START ON 07/29/2022] PHENObarbital  32.5 mg Intravenous Q8H   Continuous inpatient infusions:   calcium gluconate     cefTRIAXone (ROCEPHIN)  IV 2 g (07/26/22 1048)   dextrose 50 mL/hr at 07/26/22 1019   fentaNYL infusion INTRAVENOUS 150 mcg/hr (07/26/22 1019)   octreotide (SANDOSTATIN) 500 mcg in sodium chloride 0.9 % 250 mL (2 mcg/mL) infusion 50 mcg/hr (07/26/22 1019)   pantoprazole 8 mg/hr (07/26/22 1019)   phytonadione (VITAMIN K) 5 mg in dextrose 5 % 50 mL IVPB     potassium chloride     potassium PHOSPHATE IVPB (in mmol)     propofol (DIPRIVAN) infusion 40 mcg/kg/min (07/26/22 1019)   thiamine (VITAMIN B1) injection     Followed  by   Derrill Memo ON 07/28/2022] thiamine (VITAMIN B1) injection     PRN inpatient medications: fentaNYL (SUBLIMAZE) injection, LORazepam, mouth rinse  Vital signs in last 24 hours: Temp:  [98.3 F (36.8 C)-99.6 F (37.6 C)] 99.4 F (37.4 C) (11/16 1115) Pulse Rate:  [100-135] 112 (11/16 1115) Resp:  [16-24] 16 (11/16 1115) BP: (96-167)/(50-110) 102/53 (11/16 1115) SpO2:  [94 %-100 %] 96 % (11/16 1115) FiO2 (%):  [30 %-40 %] 30 % (11/16 1120) Weight:  [71 kg] 71 kg (11/16 0449) Last BM Date :  (PTA)  Intake/Output Summary (Last 24 hours) at 07/26/2022 1124 Last data filed at 07/26/2022 1019 Gross per 24 hour  Intake 3721.52 ml  Output 2280 ml  Net 1441.52 ml    Intake/Output from previous day: 11/15 0701 - 11/16 0700 In: 4433.4 [I.V.:1388.8; Blood:1706.3; IV Piggyback:638.3] Out: 2280 [Urine:1400; Stool:880] Intake/Output this shift: Total I/O In: 1148.7 [I.V.:759.9; IV Piggyback:388.7] Out: -    Physical Exam:  General: sedated. ETT on vent  Heart:  Regular rate and rhythm. No lower extremity edema Pulmonary: Intubated Abdomen: Soft, nondistended. Normal bowel sounds.  Neurologic:sedated   Principal Problem:   GI bleed Active Problems:   Alcoholic cirrhosis (HCC)   Acute upper GI bleed   Secondary esophageal varices with bleeding ([place])   AVM (arteriovenous malformation) of small bowel, acquired with hemorrhage   Esophageal varices in cirrhosis ([place])   Hematemesis     LOS: 1 day   Joann [name] ,NP 07/26/2022, 11:25 AM

## 2022-07-26 NOTE — Progress Notes (Signed)
eLink Physician-Brief Progress Note Patient Name: Joann [name] DOB: 05-30-1981 MRN: [id number]   Date of Service  07/26/2022  HPI/Events of Note  Hypoglycemia - Blood glucose = 62.   eICU Interventions  Plan: D10W IV infusion at 50 mL/hour.     Intervention Category Major Interventions: Other:  Lysle Dingwall 07/26/2022, 5:48 AM

## 2022-07-26 NOTE — Progress Notes (Signed)
PCCM Interval Note  Notified of drop in BP with SBP in the 80's.  Sedation decreased which helped.   Stat H/H repeated> 9.3 (@0817 )> now 6.7  No evidence of bleeding> stool remains brown from FMS.   P:  GI notified Repeat H/H to verify Have ordered PRBC, FFP, plts, and cryo in the event Hgb is correct Limited access> will plan on proceeding with CVL placement Reglan now to help clear stomach in the event of repeat EGD     Kennieth Rad, MSN, AG-ACNP-BC [facility] 07/26/2022, 1:50 PM  See Amion for pager If no response to pager, please call PCCM consult pager After 7:00 pm call Elink

## 2022-07-26 NOTE — Progress Notes (Addendum)
Repeat Hb 9.2 before pRBCs given. Platelets remain low. Reasonable to still give FFP, cyro, platelets given coagulopathy and thrombocytopenia, but hold pRBCs.    Julian Hy, DO 07/26/22 2:49 PM [facility]  Still going down on sedation, remains nonresponsive, not aggressive as she had been described overnight. - Will obtain STAT head CT to r/o coagulopathy associated bleed. D/w RN.  Julian Hy, DO 07/26/22 2:57 PM [facility]

## 2022-07-26 NOTE — Progress Notes (Signed)
Initial Nutrition Assessment  DOCUMENTATION CODES:   Not applicable  INTERVENTION:  -If pt cannot be extubated by hospital day 5 and NGT not feasible, consider TPN -When diet may be advanced, recommend regular diet with ONS to meet increased needs with cirrhosis  NUTRITION DIAGNOSIS:  Moderate Malnutrition related to chronic illness (cirrhosis and esophageal varices) as evidenced by percent weight loss, mild fat depletion, mild muscle depletion.  GOAL:  Patient will meet greater than or equal to 90% of their needs   MONITOR:  Vent status  REASON FOR ASSESSMENT:  Ventilator    ASSESSMENT:  Pt is a 41yo F with PMH of alcohol abuse, cirrhosis, recent upper GI bleed, DTs w/seizures, hepatic encephalopathy, HTN, and anxiety who presents with vomiting blood x1 day and several days of melena.  Pt is s/p EGD which showed: "1. Grade II and grade III esophageal varices with stigmata of recent bleeding in the distal esophagus. Completely eradicated. Banded. 2.1 cm hiatal hernia. 3.Portal hypertensive gastropathy throughout. 4.A single bleeding angiodysplastic lesion in the duodenum. Treated with argon plasma coagulation (APC). Clip (MR conditional) was placed. 5. No gross lesions in the duodenal bulb, in the first portion of the duodenum and in the second portion of the duodenum." Remains intubated and sedated in ICU. Propofol weaned to 11.43mL/hr which provides 302kcal from fat per day. Pt unable to have NGT placed due to esophageal varices.   Visited pt at bedside this AM. No family present at this time. Pt shows mild fat loss and mild muscle wasting. Chart review shows a significant 7.7% unintended weight loss in less than 1 month. Pt meets ASPEN criteria for moderate protein calorie malnutrition.  If pt unable to be weaned from vent by hospital day 5 and NGT not feasible, consider TPN.  Medications reviewed and include: folic acid, lactulose enema, protonix, thiamine, D10, fentanyl,  propofol, ativan  Labs reviewed: Na:145, K:3.3-repleted, BUN<5, Cr:0.85, MG:1.5, Alk Phos:173, AST:100, Ammonia, 166, Total Bili:3.8, triglycerides:283, Hgb:8.9   NUTRITION - FOCUSED PHYSICAL EXAM:  Flowsheet Row Most Recent Value  Orbital Region No depletion  Upper Arm Region Mild depletion  Thoracic and Lumbar Region Mild depletion  Buccal Region Unable to assess  Temple Region Mild depletion  Clavicle Bone Region No depletion  Clavicle and Acromion Bone Region No depletion  Scapular Bone Region Unable to assess  Dorsal Hand Unable to assess  [in mitts]  Patellar Region Mild depletion  Anterior Thigh Region Mild depletion  Posterior Calf Region Unable to assess  Hair Reviewed  Eyes Unable to assess  Mouth Unable to assess  Skin Reviewed  Nails Unable to assess       Diet Order:   Diet Order     None       EDUCATION NEEDS:   Not appropriate for education at this time  Skin:  Skin Assessment: Reviewed RN Assessment  Last BM:  FMS for lactulose enemas  Height:  Ht Readings from Last 1 Encounters:  07/25/22 5' 4" (1.626 m)   Weight:  Wt Readings from Last 1 Encounters:  07/26/22 71 kg    BMI:  Body mass index is 26.87 kg/m.  Estimated Nutritional Needs:  Kcal:  1775-2130kcal Protein:  85-110g Fluid:  1775-2130mL  Katie , MS, RD, LDN, CNSC See AMiON for contact information  

## 2022-07-27 ENCOUNTER — Inpatient Hospital Stay (HOSPITAL_COMMUNITY): Payer: Medicaid Other

## 2022-07-27 ENCOUNTER — Inpatient Hospital Stay (HOSPITAL_COMMUNITY)
Admit: 2022-07-27 | Discharge: 2022-07-27 | Disposition: A | Discharge status: Home / Self Care | Payer: Medicaid Other | Attending: Student | Admitting: Student

## 2022-07-27 ENCOUNTER — Encounter (HOSPITAL_COMMUNITY): Payer: Self-pay | Admitting: Gastroenterology

## 2022-07-27 DIAGNOSIS — R4182 Altered mental status, unspecified: Secondary | ICD-10-CM

## 2022-07-27 DIAGNOSIS — F10939 Alcohol use, unspecified with withdrawal, unspecified: Secondary | ICD-10-CM | POA: Diagnosis not present

## 2022-07-27 DIAGNOSIS — I8511 Secondary esophageal varices with bleeding: Secondary | ICD-10-CM | POA: Diagnosis not present

## 2022-07-27 DIAGNOSIS — K922 Gastrointestinal hemorrhage, unspecified: Secondary | ICD-10-CM | POA: Diagnosis not present

## 2022-07-27 DIAGNOSIS — J9601 Acute respiratory failure with hypoxia: Secondary | ICD-10-CM

## 2022-07-27 DIAGNOSIS — K7030 Alcoholic cirrhosis of liver without ascites: Secondary | ICD-10-CM | POA: Diagnosis not present

## 2022-07-27 LAB — CBC
HCT: 27.4 % — ABNORMAL LOW (ref 36.0–46.0)
HCT: 27.7 % — ABNORMAL LOW (ref 36.0–46.0)
Hemoglobin: 8 g/dL — ABNORMAL LOW (ref 12.0–15.0)
Hemoglobin: 8.1 g/dL — ABNORMAL LOW (ref 12.0–15.0)
MCH: 27.3 pg (ref 26.0–34.0)
MCH: 27.4 pg (ref 26.0–34.0)
MCHC: 29.2 g/dL — ABNORMAL LOW (ref 30.0–36.0)
MCHC: 29.2 g/dL — ABNORMAL LOW (ref 30.0–36.0)
MCV: 93.5 fL (ref 80.0–100.0)
MCV: 93.6 fL (ref 80.0–100.0)
Platelets: 37 10*3/uL — ABNORMAL LOW (ref 150–400)
Platelets: 31 10*3/uL — ABNORMAL LOW (ref 150–400)
RBC: 2.93 MIL/uL — ABNORMAL LOW (ref 3.87–5.11)
RBC: 2.96 MIL/uL — ABNORMAL LOW (ref 3.87–5.11)
RDW: 20.8 % — ABNORMAL HIGH (ref 11.5–15.5)
RDW: 20.6 % — ABNORMAL HIGH (ref 11.5–15.5)
WBC: 7.1 10*3/uL (ref 4.0–10.5)
WBC: 7.1 10*3/uL (ref 4.0–10.5)
nRBC: 0 % (ref 0.0–0.2)
nRBC: 0.3 % — ABNORMAL HIGH (ref 0.0–0.2)

## 2022-07-27 LAB — GLUCOSE, CAPILLARY
Glucose-Capillary: 134 mg/dL — ABNORMAL HIGH (ref 70–99)
Glucose-Capillary: 158 mg/dL — ABNORMAL HIGH (ref 70–99)
Glucose-Capillary: 142 mg/dL — ABNORMAL HIGH (ref 70–99)
Glucose-Capillary: 94 mg/dL (ref 70–99)
Glucose-Capillary: 151 mg/dL — ABNORMAL HIGH (ref 70–99)
Glucose-Capillary: 152 mg/dL — ABNORMAL HIGH (ref 70–99)

## 2022-07-27 LAB — PREPARE PLATELET PHERESIS
Unit division: 0
Unit division: 0

## 2022-07-27 LAB — PREPARE FRESH FROZEN PLASMA

## 2022-07-27 LAB — COMPREHENSIVE METABOLIC PANEL
ALT: 14 U/L (ref 0–44)
AST: 145 U/L — ABNORMAL HIGH (ref 15–41)
Albumin: 2.6 g/dL — ABNORMAL LOW (ref 3.5–5.0)
Alkaline Phosphatase: 150 U/L — ABNORMAL HIGH (ref 38–126)
Anion gap: 6 (ref 5–15)
BUN: <5 mg/dL — ABNORMAL LOW (ref 6–20)
CO2: 22 mmol/L (ref 22–32)
Calcium: 7.2 mg/dL — ABNORMAL LOW (ref 8.9–10.3)
Chloride: 111 mmol/L (ref 98–111)
Creatinine, Ser: 0.57 mg/dL (ref 0.44–1.00)
GFR, Estimated: >60 mL/min (ref >60)
Glucose, Bld: 132 mg/dL — ABNORMAL HIGH (ref 70–99)
Potassium: 3.4 mmol/L — ABNORMAL LOW (ref 3.5–5.1)
Sodium: 139 mmol/L (ref 135–145)
Total Bilirubin: 5.1 mg/dL — ABNORMAL HIGH (ref 0.3–1.2)
Total Protein: 7.3 g/dL (ref 6.5–8.1)

## 2022-07-27 LAB — PROTIME-INR
INR: 2.1 — ABNORMAL HIGH (ref 0.8–1.2)
INR: 1.7 — ABNORMAL HIGH (ref 0.8–1.2)
Prothrombin Time: 23.1 seconds — ABNORMAL HIGH (ref 11.4–15.2)
Prothrombin Time: 19.7 seconds — ABNORMAL HIGH (ref 11.4–15.2)

## 2022-07-27 LAB — BPAM FFP
Blood Product Expiration Date: 202311212359
Blood Product Expiration Date: 202311212359
ISSUE DATE / TIME: 202311161547
ISSUE DATE / TIME: 202311162029
Unit Type and Rh: 5100
Unit Type and Rh: 5100

## 2022-07-27 LAB — BPAM CRYOPRECIPITATE
Blood Product Expiration Date: 202311170259
ISSUE DATE / TIME: 202311162122
Unit Type and Rh: 5100

## 2022-07-27 LAB — BPAM PLATELET PHERESIS
Blood Product Expiration Date: 202311182359
Blood Product Expiration Date: 202311192359
ISSUE DATE / TIME: 202311161142
ISSUE DATE / TIME: 202311162336
Unit Type and Rh: 5100
Unit Type and Rh: 5100

## 2022-07-27 LAB — PREPARE CRYOPRECIPITATE: Unit division: 0

## 2022-07-27 LAB — MAGNESIUM: Magnesium: 1.7 mg/dL (ref 1.7–2.4)

## 2022-07-27 LAB — PHOSPHORUS: Phosphorus: 3.2 mg/dL (ref 2.5–4.6)

## 2022-07-27 LAB — AMMONIA: Ammonia: 111 umol/L — ABNORMAL HIGH (ref 9–35)

## 2022-07-27 MED ORDER — VITAMIN K1 10 MG/ML IJ SOLN
10.0000 mg | Freq: Every day | INTRAVENOUS | Status: AC
  Administered 2022-07-27 – 2022-07-29 (×3): 10 mg via INTRAVENOUS
  Filled 2022-07-27 (×3): qty 1

## 2022-07-27 MED ORDER — SODIUM CHLORIDE 0.9% IV SOLUTION: Freq: Once | INTRAVENOUS | Status: DC

## 2022-07-27 MED ORDER — POTASSIUM CHLORIDE 10 MEQ/50ML IV SOLN
10.0000 meq | INTRAVENOUS | Status: AC
  Administered 2022-07-27 (×4): 10 meq via INTRAVENOUS
  Filled 2022-07-27 (×4): qty 50

## 2022-07-27 MED ORDER — VECURONIUM BROMIDE 10 MG IV SOLR
INTRAVENOUS | Status: AC
  Administered 2022-07-27: 10 mg via INTRAVENOUS
  Filled 2022-07-27: qty 10

## 2022-07-27 MED ORDER — CALCIUM GLUCONATE-NACL 1-0.675 GM/50ML-% IV SOLN
1.0000 g | Freq: Once | INTRAVENOUS | Status: AC
  Administered 2022-07-27: 1000 mg via INTRAVENOUS
  Filled 2022-07-27: qty 50

## 2022-07-27 MED ORDER — VECURONIUM BROMIDE 10 MG IV SOLR: 10.0000 mg | Freq: Once | INTRAVENOUS | Status: AC

## 2022-07-27 MED ORDER — MAGNESIUM SULFATE 2 GM/50ML IV SOLN
2.0000 g | Freq: Once | INTRAVENOUS | Status: AC
  Administered 2022-07-27: 2 g via INTRAVENOUS
  Filled 2022-07-27: qty 50

## 2022-07-27 MED ORDER — STERILE WATER FOR INJECTION IJ SOLN
INTRAMUSCULAR | Status: AC
  Administered 2022-07-27: 10 mL
  Filled 2022-07-27: qty 10

## 2022-07-27 NOTE — Progress Notes (Signed)
Per MD order Endotracheal tube pulled back 4cm and now rests at 20cm @ the Lip.

## 2022-07-27 NOTE — Progress Notes (Signed)
07/27/2022 APP Kennieth Rad and I saw and evaluated the patient. Discussed with them and agree with their findings and plan as documented in the their note.  I have seen and evaluated the patient for acute hypoxic respiratory failure  S:  ETT migration into RMB, pulled back. Hypotension yesterday, ABX broadened and monitoring for any s/s rebleeding.   Hooking up to routine EEG this AM  O: Blood pressure 106/61, pulse (!) 119, temperature 98.6 F (37 C), temperature source Bladder, resp. rate 16, height 5' 4"  (1.626 m), weight 71 kg, SpO2 96 %.   Exam: Gen:      intubated HEENT:  icteric sclera, pinpoint, swollen tongue Neck:      No masses, JVD Lungs:    mech breath sounds bilaterally; equal chest rise CV:         tachy RR; no murmurs Abd:      + hypoactive bowel sounds; soft, non-tender; no palpable masses, no distension Ext:    No edema; adequate peripheral perfusion Neuro:    deeply sedated   Ammonia 111 AST 145 Bili 5.1  WBC 7 Hb 8 PLT 37  CXR ETT in good position, vascular congestion vs developing perihilar opacities  A:  # Acute hypoxic respiratory failure, swollen tongue # Shock - unclear if developing sepsis without obvious source, effect of sedation + liver disease # Acute metabolic encephalopathy # Elevated bilirubin # Acute blood loss anemia due to variceal bleeding s/p banding 11/15 # Cirrhosis decompensated by EV, PSE # pAF/RVR # Abnormal electrolytes # Alcohol use disorder and history of severe withdrawal # Moderate protein calorie malnutrition  P:  - RUQ/US doppler liver - 3 day course octreotide, protonix gtt - 5 day course ceftriaxone, continue vanc/flagyl for 48h pending repeat culture results. Check tracheal aspirate if increased secretions - GI following, appreciate assistance - usual transfusion goals for cirrhotics with varices - defer extubation given tongue trauma, encephalopathy - f/u EEG, continue lactulose enema - would like to keep  sedation light in setting of her cirrhosis/PSE - effect of sedation may hang around for a while but also want to minimize further tongue trauma - challenging social situation, may need to entertain trach/peg depending on trajectory. Ongoing GoC discussions.  Patient critically ill due to shock, acute hypoxic respiratory failure Interventions to address this today oversight of analgesosedation strategy Risk of deterioration without these interventions is high  I personally spent 39 minutes providing critical care not including any separately billable procedures   [place]

## 2022-07-27 NOTE — Progress Notes (Signed)
Daily Progress Note  Hospital Day: 3  Chief Complaint: GI bleed, cirrhosis  Brief History [name] with a pmh not limited to Etoh abuse, DTs with seizures, Etoh cirrhosis, IDA.  Seen in consultation by [place] on 11/15 for hematemesis   Assessment   # 41 yo female with Etoh abuse, cirrhosis complicated by portal HTN. Admitted with hematemesis. EGD >> Grade II and grade III esophageal varices with stigmata of recent bleeding, s/p banding 11/15.   Yesterday she had a sudden decline in hgb from 8.3 to.7 coinciding with hypotension / tachycardia but repeat hgb was 9.2 so appears to have been a spurious result which makes since because she wasn't having any overt GI bleeding. Hgb has declined some overnight to 8.0 but still no overt GI bleeding. Got FFP, cyro, platelets yesterday  # Worsening hyperbilirubinemia / coagulopathy. Tbili 3.3 >> 5.1.  Rising bilirubin / worsening coagulopathy possibly multifactorial but in setting of cirrhosis,  recent heavy Etoh, ? Sepsis). Abdominal US ordered by PCCM being done at bedside right now. Doesn't appear to have much if any ascites on exam but it + then consider diagnostic tap  # Hypotension, low grade temp, improved on pressors and broad spectrum antibiotic. Sepsis? On broad spectrum antibiotics. Blood cultures pending. Awaiting [place], if + ascites then try to get diagnostic tap ( platelets 37)  # Altered mental status. She has been agitated and difficult to sedate , biting tongue.  EEG today negative for seizures but suggests severe nonspecific diffuse encephalopathy  Plan:    Continue Lactulose enemas, Q4 hours for now Continue IV PPI drip for 72 hours (until Sat morning) Continue IV Octreotide drip for at least 72 hours ( until Sat morning). On Day #3 of Ceftriaxone. Continue daily x 5 days  May need TNA soon.   Objective   Imaging:  DG CHEST PORT 1 VIEW  Result Date: 07/27/2022 CLINICAL DATA:  41 year old female intubated. Respiratory  failure. Encephalopathy. EXAM: PORTABLE CHEST 1 VIEW COMPARISON:  Portable chest 0425 hours today. FINDINGS: Portable AP supine view at 0714 hours. Stable endotracheal tube tip at the level the clavicles. Improved lung base ventilation with less veiling opacity bilaterally. No superimposed pneumothorax, pulmonary edema. Mediastinal contours are within normal limits. No definite air bronchograms. Paucity of bowel gas in the upper abdomen. No acute osseous abnormality identified. IMPRESSION: 1. Stable ET tube, tip at the level the clavicles. 2. Improved lung base ventilation since 0425 hours, regressed atelectasis and/or effusions. 3. No new cardiopulmonary abnormality. Electronically Signed   By: Genevie Ann M.D.   On: 07/27/2022 08:22   EEG adult  Result Date: 07/27/2022 Lora Havens, MD     07/27/2022 10:21 AM Patient Name: Joann [name] MRN: [id number] Epilepsy Attending: Lora Havens Referring Physician/Provider: Dr Noemi Chapel Date: 07/27/2022 Duration: 32.31 mins Patient history: 41yo F with ams. EEG to evaluate for seizure Level of alertness:  comatose AEDs during EEG study: Propofol Technical aspects: This EEG study was done with scalp electrodes positioned according to the 10-20 International system of electrode placement. Electrical activity was reviewed with band pass filter of 1-70Hz , sensitivity of 7 uV/mm, display speed of 28m/sec with a 60Hz  notched filter applied as appropriate. EEG data were recorded continuously and digitally stored.  Video monitoring was available and reviewed as appropriate. Description: EEG showed continuous generalized 2-3.5Hz  delta slowing.  Hyperventilation and photic stimulation were not performed.   ABNORMALITY - Continuous slow, generalized IMPRESSION: This study is suggestive  of severe diffuse encephalopathy, nonspecific etiology. No seizures or epileptiform discharges were seen throughout the recording. Lora Havens   DG Chest Port 1 View  Addendum Date:  07/27/2022   ADDENDUM REPORT: 07/27/2022 06:41 ADDENDUM: Discussed over phone with Dr. Corinna Lines at 6:35 a.m., 07/27/2022, with verbal acknowledgement of key findings. Electronically Signed   By: Telford Nab M.D.   On: 07/27/2022 06:41   Result Date: 07/27/2022 CLINICAL DATA:  15945 with respiratory failure. EXAM: PORTABLE CHEST 1 VIEW COMPARISON:  Portable chest 07/25/2022 FINDINGS: 4:25 a.m. ETT now enters the orifice of the right main bronchus and needs to be withdrawn 3-4 cm to a mid tracheal positioning. The heart is enlarged. There is worsening perihilar vascular congestion, increasing central interstitial edema, and increasing moderate pleural effusions with overlying opacities. The lung opacities could be due to atelectasis, airspace edema or pneumonia. The upper lung fields are clear of focal opacity. The mediastinum is stable. In all other respects no further changes. IMPRESSION: 1. ETT now enters the orifice of the right main bronchus and needs to be withdrawn 3-4 cm to a mid tracheal positioning. 2. Worsening perihilar vascular congestion, central interstitial edema, and moderate pleural effusions with increased overlying opacities. 3. Radiologist assistant is attempting to reach the ordering physician or representative at the time of this dictation for stat notification. This report will be addended when notification has been made. Electronically Signed: By: Telford Nab M.D. On: 07/27/2022 06:32   CT HEAD WO CONTRAST (5MM)  Result Date: 07/26/2022 CLINICAL DATA:  Encephalopathy. EXAM: CT HEAD WITHOUT CONTRAST TECHNIQUE: Contiguous axial images were obtained from the base of the skull through the vertex without intravenous contrast. RADIATION DOSE REDUCTION: This exam was performed according to the departmental dose-optimization program which includes automated exposure control, adjustment of the mA and/or kV according to patient size and/or use of iterative reconstruction technique.  COMPARISON:  Head CT 06/18/2022. FINDINGS: Brain: There is no evidence of acute intracranial hemorrhage, mass lesion, brain edema or extra-axial fluid collection. The ventricles and subarachnoid spaces are appropriately sized for age. There is no CT evidence of acute cortical infarction. Vascular:  No hyperdense vessel identified. Skull: Negative for fracture or focal lesion. Sinuses/Orbits: Mucosal thickening in the nasal passages. The paranasal sinuses, mastoid air cells and middle ears are clear. No significant orbital findings. Other: None. IMPRESSION: 1. No acute intracranial findings. 2. Mucosal thickening in the nasal passages. Electronically Signed   By: Richardean Sale M.D.   On: 07/26/2022 16:38   [place] EKG SITE RITE  Result Date: 07/26/2022 If Site Rite image not attached, placement could not be confirmed due to current cardiac rhythm.  DG Chest Port 1 View  Result Date: 07/25/2022 CLINICAL DATA:  Intubation EXAM: PORTABLE CHEST 1 VIEW COMPARISON:  07/25/2022 at 0549 hours FINDINGS: Interval placement of endotracheal tube with distal tip terminating approximately 2.8 cm above the carina. Heart size within normal limits. Slightly low lung volumes. No focal airspace consolidation, pleural effusion, or pneumothorax. IMPRESSION: Interval placement of endotracheal tube with distal tip terminating approximately 2.8 cm above the carina. Electronically Signed   By: Davina Poke D.O.   On: 07/25/2022 16:21   DG Abd 1 View  Result Date: 07/25/2022 CLINICAL DATA:  41 year old female with abdominal pain and hematemesis. EXAM: ABDOMEN - 1 VIEW COMPARISON:  Abdominal radiographs 06/26/2022 and earlier. FINDINGS: Portable AP supine view at 1013 hours. Non obstructed bowel gas pattern. Small volume gas in fairly decompressed stomach. No pneumoperitoneum is evident on  these supine views. Lung bases appear negative. Stable cholecystectomy clips. Other abdominal and pelvic visceral contours are stable and  within normal limits. Chronic pelvic phleboliths. No acute osseous abnormality identified. IMPRESSION: 1. Normal bowel gas pattern.  Negative visible lung bases. 2. Note history of cirrhosis and consider variceal bleeding in the setting of hematemesis. Electronically Signed   By: Genevie Ann M.D.   On: 07/25/2022 09:30   DG Chest Port 1 View  Result Date: 07/25/2022 CLINICAL DATA:  41 year old female under screening evaluation. EXAM: PORTABLE CHEST 1 VIEW COMPARISON:  Chest x-ray 06/25/2022. FINDINGS: Lung volumes are normal. No consolidative airspace disease. No pleural effusions. No pneumothorax. No pulmonary nodule or mass noted. Pulmonary vasculature and the cardiomediastinal silhouette are within normal limits. IMPRESSION: No radiographic evidence of acute cardiopulmonary disease. Electronically Signed   By: Vinnie Langton M.D.   On: 07/25/2022 06:05    Lab Results: Recent Labs    07/26/22 0817 07/26/22 1245 07/26/22 1413 07/26/22 1700 07/27/22 0338  WBC 5.0  --  2.4*  --  7.1  HGB 9.3*   < > 9.2* 8.7* 8.0*  HCT 30.5*   < > 31.3* 30.1* 27.4*  PLT 25*  --  23* 25* 37*   < > = values in this interval not displayed.   BMET Recent Labs    07/25/22 2359 07/26/22 0959 07/27/22 0338  NA 145 140 139  K 3.3* 3.4* 3.4*  CL 112* 112* 111  CO2 18* 21* 22  GLUCOSE 94 139* 132*  BUN <5* <5* <5*  CREATININE 0.85 0.72 0.57  CALCIUM 7.8* 7.4* 7.2*   LFT Recent Labs    07/27/22 0338  PROT 7.3  ALBUMIN 2.6*  AST 145*  ALT 14  ALKPHOS 150*  BILITOT 5.1*   PT/INR Recent Labs    07/26/22 1700 07/27/22 0338  LABPROT 19.2* 23.1*  INR 1.6* 2.1*     Scheduled inpatient medications:   sodium chloride   Intravenous Once   Chlorhexidine Gluconate Cloth  6 each Topical Daily   folic acid  1 mg Intravenous Daily   lactulose  300 mL Rectal Q4H   mouth rinse  15 mL Mouth Rinse Q2H   [START ON 07/28/2022] pantoprazole  40 mg Intravenous Q12H   PHENObarbital  97.5 mg Intravenous Q8H    Followed by   PHENObarbital  65 mg Intravenous Q8H   Followed by   [START ON 07/29/2022] PHENObarbital  32.5 mg Intravenous Q8H   Continuous inpatient infusions:   sodium chloride Stopped (07/26/22 2119)   calcium gluconate     cefTRIAXone (ROCEPHIN)  IV Stopped (07/27/22 1003)   dextrose 50 mL/hr at 07/27/22 1011   fentaNYL infusion INTRAVENOUS 200 mcg/hr (07/27/22 1011)   metronidazole Stopped (07/27/22 0811)   norepinephrine (LEVOPHED) Adult infusion 5 mcg/min (07/27/22 1011)   octreotide (SANDOSTATIN) 500 mcg in sodium chloride 0.9 % 250 mL (2 mcg/mL) infusion 50 mcg/hr (07/27/22 1011)   pantoprazole 8 mg/hr (07/27/22 1011)   phytonadione (VITAMIN K) 10 mg in dextrose 5 % 50 mL IVPB     propofol (DIPRIVAN) infusion 20 mcg/kg/min (07/27/22 1011)   thiamine (VITAMIN B1) injection Stopped (07/27/22 1006)   Followed by   Derrill Memo ON 07/28/2022] thiamine (VITAMIN B1) injection     vancomycin Stopped (07/27/22 0736)   PRN inpatient medications: sodium chloride, fentaNYL (SUBLIMAZE) injection, LORazepam, mouth rinse  Vital signs in last 24 hours: Temp:  [96.8 F (36 C)-100.7 F (38.2 C)] 98.1 F (36.7 C) (11/17 6389)  Pulse Rate:  [95-148] 97 (11/17 0928) Resp:  [15-27] 16 (11/17 0928) BP: (79-154)/(48-89) 91/56 (11/17 0928) SpO2:  [92 %-100 %] 97 % (11/17 0928) FiO2 (%):  [30 %-100 %] 30 % (11/17 0840) Last BM Date : 07/26/22 (fecal management system in place)  Intake/Output Summary (Last 24 hours) at 07/27/2022 1028 Last data filed at 07/27/2022 1011 Gross per 24 hour  Intake 6798.7 ml  Output 2745 ml  Net 4053.7 ml    Intake/Output from previous day: 11/16 0701 - 11/17 0700 In: 6487 [I.V.:2951.6; Blood:990.7; IV Piggyback:1884.8] Out: 3005 [Urine:1400; RTMYT:1173] Intake/Output this shift: Total I/O In: 1460.3 [I.V.:583.7; Blood:196; IV Piggyback:680.6] Out: -    Physical Exam:  General: sedated on vent Heart:  Mildly tachycardic, regular rhythm. No lower  extremity edema Pulmonary: intubated on vent Abdomen: Soft, nondistended, nontender. Hypoactive bowel bowel sounds.  Neurologic: sedated   Principal Problem:   GI bleed Active Problems:   Alcoholic cirrhosis (HCC)   Acute upper GI bleed   Secondary esophageal varices with bleeding ([place])   AVM (arteriovenous malformation) of small bowel, acquired with hemorrhage   Esophageal varices in cirrhosis ([place])   Hematemesis     LOS: 2 days   Tye Savoy ,NP 07/27/2022, 10:28 AM

## 2022-07-27 NOTE — Procedures (Signed)
Patient Name: Joann [name]  MRN: [id number]  Epilepsy Attending: Lora Havens  Referring Physician/Provider: Dr Noemi Chapel Date: 07/27/2022 Duration: 32.31 mins  Patient history: 41yo F with ams. EEG to evaluate for seizure  Level of alertness:  comatose  AEDs during EEG study: Propofol  Technical aspects: This EEG study was done with scalp electrodes positioned according to the 10-20 International system of electrode placement. Electrical activity was reviewed with band pass filter of 1-70Hz , sensitivity of 7 uV/mm, display speed of 56m/sec with a 60Hz  notched filter applied as appropriate. EEG data were recorded continuously and digitally stored.  Video monitoring was available and reviewed as appropriate.  Description: EEG showed continuous generalized 2-3.5Hz  delta slowing.  Hyperventilation and photic stimulation were not performed.     ABNORMALITY - Continuous slow, generalized  IMPRESSION: This study is suggestive of severe diffuse encephalopathy, nonspecific etiology. No seizures or epileptiform discharges were seen throughout the recording.  Joann [name]

## 2022-07-27 NOTE — Progress Notes (Signed)
[facility] ADULT ICU REPLACEMENT PROTOCOL   The patient does apply for the [facility] Adult ICU Electrolyte Replacment Protocol based on the criteria listed below:   1.Exclusion criteria: TCTS, ECMO, Dialysis, and Myasthenia Gravis patients 2. Is GFR >/= 30 ml/min? Yes.    Patient's GFR today is >60 3. Is SCr </= 2? Yes.   Patient's SCr is 0.57 mg/dL 4. Did SCr increase >/= 0.5 in 24 hours? No. 5.Pt's weight >40kg  Yes.   6. Abnormal electrolyte(s): K+ 3.4, Mag 1.7  7. Electrolytes replaced per protocol 8.  Call MD STAT for K+ </= 2.5, Phos </= 1, or Mag </= 1 Physician:  Ed Blalock [facility] 07/27/2022 4:47 AM

## 2022-07-27 NOTE — Procedures (Signed)
Patient Name: Joann [name]  MRN: [id number]  Epilepsy Attending: Lora Havens  Referring Physician/Provider: Dr Noemi Chapel Duration: 07/26/2022 2104 07/27/2022 0001    Patient history: 41yo F with ams. EEG to evaluate for seizure   Level of alertness:  comatose   AEDs during EEG study: Propofol   Technical aspects: This EEG was obtained using a 10 lead EEG system positioned circumferentially without any parasagittal coverage (rapid EEG). Computer selected EEG is reviewed as  well as background features and all clinically significant events.  Description: EEG showed continuous generalized 2-3.5Hz  delta slowing.  Hyperventilation and photic stimulation were not performed.      ABNORMALITY - Continuous slow, generalized   IMPRESSION: This limited ceribell EEEGy is suggestive of severe diffuse encephalopathy, nonspecific etiology. No seizures or epileptiform discharges were seen throughout the recording.   Kamran Coker Barbra Sarks

## 2022-07-27 NOTE — Progress Notes (Signed)
STAT EEG complete - results pending. ? ?

## 2022-07-27 NOTE — Progress Notes (Signed)
Per Dr. Karolee Stamps previous instructions to remove cerebell. Cerebell has been removed.

## 2022-07-27 NOTE — Progress Notes (Addendum)
[place] Progress Note Patient Name: DAWNELLE WARMAN DOB: 1981-03-17 MRN: [id number]   Date of Service  07/27/2022  HPI/Events of Note  Called by radiology with findings of CXR: 1. ETT now enters the orifice of the right main bronchus and needs to be withdrawn 3-4 cm to a mid tracheal positioning. 2. Worsening perihilar vascular congestion, central interstitial edema, and moderate pleural effusions with increased overlying opacities. Unable to diurese at this time due to soft BP = 96/54 and Norepinephrine IV infusion requirement to support BP.  eICU Interventions  Plan: Withdraw ETT 4 cm and repeat CXR to confirm position. Defer decision to diurese to PCCM rounding team.      Intervention Category Major Interventions: Respiratory failure - evaluation and management  Lysle Dingwall 07/27/2022, 6:36 AM

## 2022-07-27 NOTE — Progress Notes (Signed)
[place] Progress Note Patient Name: Joann [name] DOB: 05-08-1981 MRN: [id number]   Date of Service  07/27/2022  HPI/Events of Note  Coagulopathy - INR = 2.1.  eICU Interventions  Plan: Transfuse 2 units FFP now. Repeat PT/INR at 1 PM.     Intervention Category Major Interventions: Other:  Yarethzy Croak Cornelia Copa 07/27/2022, 5:07 AM

## 2022-07-27 NOTE — Progress Notes (Signed)
Applied cerebell to patient and informed neurologists Dr. Hortense Ramal and Dr. Rory Percy. Per Dr. Hortense Ramal to continue with monitoring until 0000 and discontinue unless any possible seizure activity.

## 2022-07-27 NOTE — Progress Notes (Signed)
[place] Progress Note Patient Name: Joann [name] DOB: 1980-11-01 MRN: [id number]   Date of Service  07/27/2022  HPI/Events of Note  Nursing concern for patient no longer opening eyes. Patient grimaces with pain and moves extremities with pain. Pupils are pinpoint and sluggish. Currently on Propofol IV infusion at 10 mcg/kg/min and Fentanyl IV infusion at 150 mcg/hour. Patient had decreased responsiveness yesterday on day shift and Head CT Scan was negative at that time.   eICU Interventions  Plan:  1.Hold Fentanyl IV infusion for 1 hour and then continue to wean IV infusion. 2. Continue to wean Propofol IV infusion.      Intervention Category Major Interventions: Change in mental status - evaluation and management  Himmat Enberg [place] 07/27/2022, 12:45 AM

## 2022-07-27 NOTE — Progress Notes (Addendum)
NAME:  Joann [name], MRN:  [id number], DOB:  1981/08/12, LOS: 2 ADMISSION DATE:  07/25/2022, CONSULTATION DATE:  07/25/2022 REFERRING MD:  Dr. Ralene Bathe, CHIEF COMPLAINT:  hematemesis    History of Present Illness:  41 year old female with PMH significant for current ETOH abuse, ETOH cirrhosis, DTs w/seizures, hepatic encephalopathy, HTN, and anxiety presenting to [facility] ER with complaints of nausea with multiple episodes of hematemesis since Tuesday morning with RUQ abdominal, weakness, unable to eat in three days, and several days of dark stools. Drinks a gallon of vodka daily, last drink was 11/14.  Hx of seizures when she tries to quit.  Multiple episodes of small dark bloody emesis in ER.  Hgb 9.7, INR 1.6, plts 76.   Admitted to PCCM.  Given FFP, PRBC, and txa.  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    Pertinent  Medical History  ETOH abuse, ETOH cirrhosis with grade 1 esophageal varices, portal HTN gastropathy, DTs w/seizures, hepatic encephalopathy, HTN, anxiety  Previously f/b [facility] Events: Including procedures, antibiotic start and stop dates in addition to other pertinent events   11/15 Admitted hematemesis, intubated/ EGD> esophageal varices s/p 6 bands, multiple blood products/ txa/ vit k 11/16 very hard to sedate then progressively hypotensive despite decreasing sedation> CTH neg, EEG/ cerebell neg, multiple blood products for coagulapathy/ txa/ vit k, tongue biting/ clenching  Interim History / Subjective:  Tmax 100.7 overnight On NE 2 mcg Fentanyl shut off, propofol 20> pt sat up, agitated, MAE, not f/c, clenching on ETT/ tongue starting to swell/ protrude despite bite block.  Had some oral bloody secretions yesterday likely from tongue, resolved today  Additional plts given this am Worsening INR, t. Bili  ETT right mainstem on CXR>  since retracted to 20 at lip  Objective   Blood pressure 106/61, pulse (!) 119, temperature 98.6 F (37 C), temperature source (P) Bladder, resp. rate 16, height 5' 4"  (1.626 m), weight 71 kg, SpO2 96 %.    Vent Mode: PRVC FiO2 (%):  [30 %-100 %] (P) 30 % Set Rate:  [16 bmp-22 bmp] 16 bmp Vt Set:  [440 mL] 440 mL PEEP:  [5 cmH20] 5 cmH20 Pressure Support:  [10 cmH20] 10 cmH20 Plateau Pressure:  [16 cmH20-32 cmH20] 21 cmH20   Intake/Output Summary (Last 24 hours) at 07/27/2022 0741 Last data filed at 07/27/2022 0602 Gross per 24 hour  Intake 6487.03 ml  Output 2595 ml  Net 3892.03 ml   Filed Weights   07/25/22 0528 07/26/22 0449  Weight: 63.5 kg 71 kg   Examination: Propofol 10 Fentanyl 150  General:  critically ill adult female sedated on MV HEENT:  mildly protruding tongue with swelling and non bleeding prior abrasion with ongoing clinching despite bite block, ETT 7 at 20, pupils 2/ reactive, scleral icterus and edema Neuro: sedated, does not f/c or spont move, with stimulation> clinches more on teeth with stimulation CV: rr, ST, no murmur PULM:   non labored on MV, coarse, no wheeze  GI: soft, bs+, ND, foley- darker yellow urine, FMS> brown stool Extremities: warm/dry, trace generalized edema  Skin: no rashes  UOP 1.4L/ 24 Net +3.7L  CBGs 128-158 (on D10)  Labs K 3.4, sCr 0.57, Mag 1.7, phos 3.2, AST 120> 145, t. Bili 3.8> 5.1, WBC 2.4> 7.1, Hgb 9.2> 8, Hct 27, plts 37, no schistocytes on yest  DIC (other factors inaccurate as was still getting blood products), INR 1.6> 2.1, PT 19> 23, UA neg for leuk/ nitrates, ammonia better 111  Resolved Hospital Problem list    Assessment & Plan:   Upper GIB 2/2 bleeding esophageal varices and duodenum lesion ETOH cirrhosis ETOH abuse, ongoing, 1 gallon of vodka daily, last drink 11/14 Portal hypertensive gastropathy  Hepatic encephalopathy  - hepatitis discriminant score 26.3, steroids deferred - MELD score 16, 6% of 3 month  mortality  - EGD 11/15 by Dr. 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 s/p APC and clip  (prior EGD 06/26/22 showed one single column of grade I esophageal varices  Plan:  - Greatly appreciate assistance from [facility] - cont PPI drip for total of 72 hrs then transition to BID - cont IV octreotide for 72 hrs - stop date placed by pharmacy - ammonia better today, continue lactulose enemas, q4hrs - trend  ammonia  - will need to consider nutrition if unable to liberate from MV soon, TPN vs dobhoff (very high risk given banding) - trending Hgb, no signs of bleeding.  Hgb 9.2> 8.  Will recheck q 12 or sooner if s/s bleeding - cont ceftriaxone for SBP ppx  - given worsening t. Bili, INR> liver US with doppler.  Appreciate further recs from GI - correct coagulapathy as below  - recs for repeat EGD in 4 weeks  Acute respiratory insufficiency related to above  - intubated for EGD 11/15 and left on MV overnight to monitor for rebleeding of banded varices  P:  - ETT retracted already given R mainstem, repeat CXR with satisfactory placement and improving atelectasis  -  Cont MV support, 4-8cc/kg IBW with goal Pplat <30 and DP<15  - VAP prevention protocol/ PPI  - PAD protocol for sedation> propofol/ fentanyl gtt for RASS goal 0/-1.  Agitation/ biting of ETT has limited weaning of sedation to prevent further injury/ bleeding.  Limited given no enteral access for meds - will need to monitor tongue swelling and prevent clenching/ bleeding given coagulapathy> has bite block, need to ensure tongue remains by teeth - wean FiO2 as able for SpO2 >92%  - daily SAT & SBT when appropriate  - intermittent CXR  Shock>  - likely multifactorial related to ABLA/ GIB as above and ETOH w/d but need to rule out sepsis component - abx broadened 11/17 given leukopenia, tmax 100.7 - cont  ceftriaxone/ flagyl/ vanc for now - WBC/ fever curve better today, monitor - 1/4 stap epi in BC> likely contaminant> monitor  - wean NE for MAP goal > 65 - UA neg - monitor H/H / bleeding closely   Acute on chronic Anemia  Coagulapathy - acute on chronic thrombocytopenia, elevated INR - 2 units FFP now with TXA f/b 2 units PRBC 11/15 - 1 FFP, 1 platelets, 1 cryo, txa, vit K 11/16 P:  - INR 1.6> 2.1 > give more vit K, consider FFP if repeat bleeding - finishing 1 plts now - trending H/H q 12  - transfuse for Hgb < 7 or active bleeding  Hypokalemia Hypocalcemia  Hypophos  Hypomagnesia  - corrected calcium 8.3, additional 1gm calcium gluconate  - K and mag replete today - repeat in am, replete as needed   Hypoglycemia - likely related to liver dysfunction, critically illness, and NPO - cont D10 gtt, titrate for CBG ~120 - CBGs q4hr -  add nutrition when able   High risk for ETOH withdrawal, hx of Dts ETOH abuse - daily 1 gallon of vodka, last drink 11/14 - continue phenobarb taper  (also on propofol) - high dose thiamine x 2 days then 161m daily (started 11/16) - cont folate IV - prn ativan q 2 - seizure precautions - will need TOC closer to discharge for options/ resources> ?inpt rehab facility  HTN - hold home coreg    Urinary retention - foley placed 11/16  - strict I/Os  Moderate Malnutrition related to chronic illness - appreciate RD consult - NPO, unable to place OGT/ dobhoff given recent esophageal banding of varices - consider starting TPN if unable to liberate from MV soon, ?Monday   PAF - had brief episode of afib 11/15.  [place], and likely just related to critical illness  - remains NSR/ ST on telemetry.  Continue to monitor  - keep K> 4, Mag > 2  Best Practice (right click and "Reselect all SmartList Selections" daily)   Diet/type: NPO DVT prophylaxis: SCD GI prophylaxis: PPI Lines: Central line Foley:  Yes, and it  is still needed Code Status:  full code Last date of multidisciplinary goals of care discussion [11/15]  Husband, TCelesta [phone](truck driver)> [place]  Advises if he can not answer phone to leave a message and he would call back.   Terrance updated by phone 11/16.  Pending update 11/17  Labs   CBC: Recent Labs  Lab 07/25/22 0547 07/25/22 0613 07/25/22 1900 07/25/22 2359 07/26/22 0817 07/26/22 1245 07/26/22 1413 07/26/22 1700 07/27/22 0338  WBC 6.7   < > 6.7 6.1 5.0  --  2.4*  --  7.1  NEUTROABS 3.8  --   --   --   --   --   --   --   --   HGB 9.7*   < > 9.1* 8.9* 9.3* 6.7* 9.2* 8.7* 8.0*  HCT 33.7*   < > 30.3* 29.5* 30.5* 21.8* 31.3* 30.1* 27.4*  MCV 88.0   < > 91.3 91.3 91.9  --  92.9  --  93.5  PLT 76*   < > 37* 34* 25*  --  23* 25* 37*   < > = values in this interval not displayed.    Basic Metabolic Panel: Recent Labs  Lab 07/25/22 0547 07/25/22 0613 07/25/22 1304 07/25/22 2359 07/26/22 0959 07/27/22 0338  NA 145 148* 147* 145 140 139  K 2.8* 5.0 3.6 3.3* 3.4* 3.4*  CL 107 109 110 112* 112* 111  CO2 20*  --  18* 18* 21* 22  GLUCOSE 122* 127* 87 94 139* 132*  BUN 5* <3* 5* <5* <5* <5*  CREATININE 0.63 0.80 0.58 0.85 0.72 0.57  CALCIUM 7.9*  --  7.7* 7.8* 7.4* 7.2*  MG  --   --   --  1.5*  --  1.7  PHOS  --   --   --   --  1.5* 3.2   GFR: Estimated Creatinine Clearance: 87.6 mL/min (by C-G formula based on SCr of 0.57 mg/dL). Recent Labs  Lab 07/25/22 2359 07/26/22 0817 07/26/22 1413 07/27/22 0338  WBC 6.1 5.0 2.4* 7.1    Liver Function Tests: Recent Labs  Lab 07/25/22 0547 07/25/22 2359 07/26/22 0959 07/27/22 0338  AST 157* 100* 120* 145*  ALT 14 11 13 14   ALKPHOS 243* 173* 164* 150*  BILITOT 3.3* 3.8* 3.8* 5.1*  PROT 10.2* 7.8  7.2 7.3  ALBUMIN 3.4* 2.8* 2.7* 2.6*   No results for input(s): "LIPASE", "AMYLASE" in the last 168 hours. Recent Labs  Lab 07/25/22 0657 07/25/22 2359 07/27/22 0330  AMMONIA 140* 166*  111*    ABG    Component Value Date/Time   PHART 7.29 (L) 07/25/2022 1707   PCO2ART 31 (L) 07/25/2022 1707   PO2ART 130 (H) 07/25/2022 1707   HCO3 14.9 (L) 07/25/2022 1707   TCO2 21 (L) 07/25/2022 0613   ACIDBASEDEF 10.6 (H) 07/25/2022 1707   O2SAT 99.4 07/25/2022 1707     Coagulation Profile: Recent Labs  Lab 07/25/22 0547 07/25/22 1301 07/25/22 2359 07/26/22 1700 07/27/22 0338  INR 1.6* 1.7* 1.6* 1.6* 2.1*    Cardiac Enzymes: No results for input(s): "CKTOTAL", "CKMB", "CKMBINDEX", "TROPONINI" in the last 168 hours.  HbA1C: Hgb A1c MFr Bld  Date/Time Value Ref Range Status  09/29/2018 03:23 PM 4.8 4.8 - 5.6 % Final    Comment:             Prediabetes: 5.7 - 6.4          Diabetes: >6.4          Glycemic control for adults with diabetes: <7.0     CBG: Recent Labs  Lab 07/26/22 1551 07/26/22 1732 07/26/22 1936 07/26/22 2329 07/27/22 0301  GLUCAP 108* 140* 144* 148* 134*    Critical care time: 40 mins    Kennieth Rad, MSN, AG-ACNP-BC [facility] 07/27/2022, 7:41 AM  See Amion for pager If no response to pager, please call PCCM consult pager After 7:00 pm call Elink

## 2022-07-27 NOTE — Progress Notes (Addendum)
Called e-link Lysbeth Galas, RN) to inform of new onset neuro changes. Patient's pupils are pinpoint (1 cm) and sluggish with reaction. Patient slightly responsive to painful stimuli with slight facial grimacing but no eye opening (changes from last known eye opening to painful stimuli at 2200). No noted changes to airway/breathing/and no signs of bleeding (UOP amber in color, fecal management system with brown-colored fecal output, skin checked on body anteriorly/posteriorly).   Per Dr. Emmit Alexanders to turn fentanyl drip off and report changes in mentation. If patient becomes agitated, verbal ok to still give fentanyl bolus and turn the fentanyl gtt back on at 75 mcg.

## 2022-07-27 NOTE — Progress Notes (Signed)
Informed e-link Lysbeth Galas, RN) of patient's agitation after turning off fentanyl gtt per discussion with Dr. Oletta Darter. Patient was biting down on her tubing and attempting to sit up. Per discussion with Dr. Oletta Darter, turned fentanyl gtt back on at 75 mcg and gave fentanyl bolus to assist with patient's current agitation.

## 2022-07-28 DIAGNOSIS — K7682 Hepatic encephalopathy: Secondary | ICD-10-CM

## 2022-07-28 DIAGNOSIS — I8501 Esophageal varices with bleeding: Secondary | ICD-10-CM

## 2022-07-28 DIAGNOSIS — K7030 Alcoholic cirrhosis of liver without ascites: Secondary | ICD-10-CM | POA: Diagnosis not present

## 2022-07-28 DIAGNOSIS — J9601 Acute respiratory failure with hypoxia: Secondary | ICD-10-CM | POA: Diagnosis not present

## 2022-07-28 LAB — CBC
HCT: 29.3 % — ABNORMAL LOW (ref 36.0–46.0)
Hemoglobin: 8.7 g/dL — ABNORMAL LOW (ref 12.0–15.0)
MCH: 27.7 pg (ref 26.0–34.0)
MCHC: 29.7 g/dL — ABNORMAL LOW (ref 30.0–36.0)
MCV: 93.3 fL (ref 80.0–100.0)
Platelets: 41 10*3/uL — ABNORMAL LOW (ref 150–400)
RBC: 3.14 MIL/uL — ABNORMAL LOW (ref 3.87–5.11)
RDW: 21.1 % — ABNORMAL HIGH (ref 11.5–15.5)
WBC: 8.6 10*3/uL (ref 4.0–10.5)
nRBC: 0.2 % (ref 0.0–0.2)

## 2022-07-28 LAB — COMPREHENSIVE METABOLIC PANEL
ALT: 11 U/L (ref 0–44)
AST: 105 U/L — ABNORMAL HIGH (ref 15–41)
Albumin: 2.7 g/dL — ABNORMAL LOW (ref 3.5–5.0)
Alkaline Phosphatase: 150 U/L — ABNORMAL HIGH (ref 38–126)
Anion gap: 4 — ABNORMAL LOW (ref 5–15)
BUN: <5 mg/dL — ABNORMAL LOW (ref 6–20)
CO2: 23 mmol/L (ref 22–32)
Calcium: 7.7 mg/dL — ABNORMAL LOW (ref 8.9–10.3)
Chloride: 114 mmol/L — ABNORMAL HIGH (ref 98–111)
Creatinine, Ser: 0.62 mg/dL (ref 0.44–1.00)
GFR, Estimated: >60 mL/min (ref >60)
Glucose, Bld: 164 mg/dL — ABNORMAL HIGH (ref 70–99)
Potassium: 3.1 mmol/L — ABNORMAL LOW (ref 3.5–5.1)
Sodium: 141 mmol/L (ref 135–145)
Total Bilirubin: 5.3 mg/dL — ABNORMAL HIGH (ref 0.3–1.2)
Total Protein: 7.6 g/dL (ref 6.5–8.1)

## 2022-07-28 LAB — BASIC METABOLIC PANEL
Anion gap: 3 — ABNORMAL LOW (ref 5–15)
BUN: <5 mg/dL — ABNORMAL LOW (ref 6–20)
CO2: 22 mmol/L (ref 22–32)
Calcium: 7.2 mg/dL — ABNORMAL LOW (ref 8.9–10.3)
Chloride: 117 mmol/L — ABNORMAL HIGH (ref 98–111)
Creatinine, Ser: 0.48 mg/dL (ref 0.44–1.00)
GFR, Estimated: >60 mL/min (ref >60)
Glucose, Bld: 128 mg/dL — ABNORMAL HIGH (ref 70–99)
Potassium: 3.2 mmol/L — ABNORMAL LOW (ref 3.5–5.1)
Sodium: 142 mmol/L (ref 135–145)

## 2022-07-28 LAB — GLUCOSE, CAPILLARY
Glucose-Capillary: 117 mg/dL — ABNORMAL HIGH (ref 70–99)
Glucose-Capillary: 117 mg/dL — ABNORMAL HIGH (ref 70–99)
Glucose-Capillary: 121 mg/dL — ABNORMAL HIGH (ref 70–99)
Glucose-Capillary: 129 mg/dL — ABNORMAL HIGH (ref 70–99)
Glucose-Capillary: 132 mg/dL — ABNORMAL HIGH (ref 70–99)
Glucose-Capillary: 143 mg/dL — ABNORMAL HIGH (ref 70–99)

## 2022-07-28 LAB — MAGNESIUM: Magnesium: 1.8 mg/dL (ref 1.7–2.4)

## 2022-07-28 LAB — PROTIME-INR
INR: 1.8 — ABNORMAL HIGH (ref 0.8–1.2)
Prothrombin Time: 20.6 seconds — ABNORMAL HIGH (ref 11.4–15.2)

## 2022-07-28 LAB — AMMONIA: Ammonia: 119 umol/L — ABNORMAL HIGH (ref 9–35)

## 2022-07-28 MED ORDER — POTASSIUM CHLORIDE 10 MEQ/50ML IV SOLN
10.0000 meq | INTRAVENOUS | Status: AC
  Administered 2022-07-28 (×6): 10 meq via INTRAVENOUS
  Filled 2022-07-28 (×6): qty 50

## 2022-07-28 MED ORDER — FENTANYL CITRATE PF 50 MCG/ML IJ SOSY
50.0000 ug | PREFILLED_SYRINGE | INTRAMUSCULAR | Status: DC | PRN
  Administered 2022-07-30: 50 ug via INTRAVENOUS
  Administered 2022-07-30 (×2): 100 ug via INTRAVENOUS
  Administered 2022-07-31: 200 ug via INTRAVENOUS
  Administered 2022-07-31: 100 ug via INTRAVENOUS
  Administered 2022-07-31 (×2): 200 ug via INTRAVENOUS
  Administered 2022-07-31 – 2022-08-01 (×3): 100 ug via INTRAVENOUS
  Administered 2022-08-01 (×3): 200 ug via INTRAVENOUS
  Administered 2022-08-02: 100 ug via INTRAVENOUS
  Administered 2022-08-02: 200 ug via INTRAVENOUS
  Administered 2022-08-02 (×2): 100 ug via INTRAVENOUS
  Administered 2022-08-03: 150 ug via INTRAVENOUS
  Administered 2022-08-03: 200 ug via INTRAVENOUS
  Administered 2022-08-03 (×2): 100 ug via INTRAVENOUS
  Administered 2022-08-03: 200 ug via INTRAVENOUS
  Administered 2022-08-03: 100 ug via INTRAVENOUS
  Administered 2022-08-03 (×2): 200 ug via INTRAVENOUS
  Administered 2022-08-03 (×3): 100 ug via INTRAVENOUS
  Administered 2022-08-04 (×7): 200 ug via INTRAVENOUS
  Administered 2022-08-04: 100 ug via INTRAVENOUS
  Administered 2022-08-04 – 2022-08-05 (×6): 200 ug via INTRAVENOUS
  Administered 2022-08-05: 150 ug via INTRAVENOUS
  Administered 2022-08-05: 200 ug via INTRAVENOUS
  Administered 2022-08-05: 150 ug via INTRAVENOUS
  Administered 2022-08-05: 200 ug via INTRAVENOUS
  Administered 2022-08-05 (×2): 150 ug via INTRAVENOUS
  Administered 2022-08-05: 200 ug via INTRAVENOUS
  Administered 2022-08-05: 150 ug via INTRAVENOUS
  Administered 2022-08-06: 100 ug via INTRAVENOUS
  Administered 2022-08-07: 50 ug via INTRAVENOUS
  Administered 2022-08-07 (×2): 100 ug via INTRAVENOUS
  Administered 2022-08-08: 200 ug via INTRAVENOUS
  Administered 2022-08-08: 150 ug via INTRAVENOUS
  Administered 2022-08-08 (×2): 100 ug via INTRAVENOUS
  Administered 2022-08-08 (×2): 150 ug via INTRAVENOUS
  Administered 2022-08-08: 100 ug via INTRAVENOUS
  Filled 2022-07-28: qty 4
  Filled 2022-07-28: qty 2
  Filled 2022-07-28: qty 3
  Filled 2022-07-28: qty 4
  Filled 2022-07-28: qty 2
  Filled 2022-07-28 (×2): qty 3
  Filled 2022-07-28: qty 2
  Filled 2022-07-28: qty 4
  Filled 2022-07-28: qty 2
  Filled 2022-07-28: qty 4
  Filled 2022-07-28 (×4): qty 2
  Filled 2022-07-28 (×2): qty 4
  Filled 2022-07-28 (×4): qty 2
  Filled 2022-07-28: qty 4
  Filled 2022-07-28: qty 3
  Filled 2022-07-28 (×2): qty 4
  Filled 2022-07-28: qty 3
  Filled 2022-07-28: qty 4
  Filled 2022-07-28: qty 2
  Filled 2022-07-28: qty 4
  Filled 2022-07-28 (×2): qty 2
  Filled 2022-07-28 (×4): qty 4
  Filled 2022-07-28: qty 2
  Filled 2022-07-28: qty 4
  Filled 2022-07-28: qty 2
  Filled 2022-07-28 (×2): qty 3
  Filled 2022-07-28: qty 2
  Filled 2022-07-28: qty 4
  Filled 2022-07-28: qty 2
  Filled 2022-07-28: qty 4
  Filled 2022-07-28: qty 3
  Filled 2022-07-28: qty 4
  Filled 2022-07-28 (×3): qty 1
  Filled 2022-07-28: qty 4
  Filled 2022-07-28: qty 3
  Filled 2022-07-28 (×2): qty 4
  Filled 2022-07-28: qty 2
  Filled 2022-07-28: qty 4
  Filled 2022-07-28: qty 3
  Filled 2022-07-28: qty 4
  Filled 2022-07-28: qty 1
  Filled 2022-07-28 (×6): qty 4
  Filled 2022-07-28 (×2): qty 2

## 2022-07-28 MED ORDER — FENTANYL CITRATE PF 50 MCG/ML IJ SOSY
50.0000 ug | PREFILLED_SYRINGE | INTRAMUSCULAR | Status: AC | PRN
  Administered 2022-07-29 – 2022-07-30 (×3): 50 ug via INTRAVENOUS
  Filled 2022-07-28 (×3): qty 1

## 2022-07-28 MED ORDER — POTASSIUM CHLORIDE 10 MEQ/50ML IV SOLN
10.0000 meq | INTRAVENOUS | Status: AC
  Administered 2022-07-28 – 2022-07-29 (×4): 10 meq via INTRAVENOUS
  Filled 2022-07-28 (×4): qty 50

## 2022-07-28 NOTE — Progress Notes (Signed)
Pharmacy Note:  Pharmacy may adjust antibiotics for renal function and replace electrolytes per Elink protocol.    Today, 07/28/22 Potassium = 3.1   Plan: Potassium 10 mEq IV q1h x 6 Obtain magnesium level and replace if less than 2 Recheck BMET at 2000    Thank you for allowing pharmacy to be a part of this patient's care.  Royetta Asal, PharmD, BCPS Clinical Pharmacist [place] Please utilize Amion for appropriate phone number to reach the unit pharmacist ([place]) 07/28/2022 9:17 AM

## 2022-07-28 NOTE — Progress Notes (Signed)
Progress Note   Subjective  Patient remains intubated.  Remains nonresponsive when weaning sedation.  Off pressors.  No recurrent bleeding overnight.  Hemoglobin stable.   Objective   Vital signs in last 24 hours: Temp:  [96.8 F (36 C)-101.1 F (38.4 C)] 99.7 F (37.6 C) (11/18 0700) Pulse Rate:  [93-131] 126 (11/18 0700) Resp:  [13-20] 13 (11/18 0700) BP: (85-145)/(48-88) 115/66 (11/18 0700) SpO2:  [95 %-98 %] 98 % (11/18 0935) FiO2 (%):  [30 %] 30 % (11/18 0935) Last BM Date : 07/27/22 General:    AA female in NAD Abdomen:  Soft, nontender nondistended.  Neurologic:  intubated, sedated on propofol.   Intake/Output from previous day: 11/17 0701 - 11/18 0700 In: 8268.4 [I.V.:3196.7; Blood:434.5; IV Piggyback:1137.2] Out: 4070 [Urine:1975; Stool:2095] Intake/Output this shift: No intake/output data recorded.  Lab Results: Recent Labs    07/27/22 0338 07/27/22 1710 07/28/22 0539  WBC 7.1 7.1 8.6  HGB 8.0* 8.1* 8.7*  HCT 27.4* 27.7* 29.3*  PLT 37* 31* 41*   BMET Recent Labs    07/26/22 0959 07/27/22 0338 07/28/22 0539  NA 140 139 141  K 3.4* 3.4* 3.1*  CL 112* 111 114*  CO2 21* 22 23  GLUCOSE 139* 132* 164*  BUN <5* <5* <5*  CREATININE 0.72 0.57 0.62  CALCIUM 7.4* 7.2* 7.7*   LFT Recent Labs    07/28/22 0539  PROT 7.6  ALBUMIN 2.7*  AST 105*  ALT 11  ALKPHOS 150*  BILITOT 5.3*   PT/INR Recent Labs    07/27/22 1148 07/28/22 0539  LABPROT 19.7* 20.6*  INR 1.7* 1.8*      Assessment / Plan:    41 year old female with alcoholic cirrhosis, actively drinking, decompensated with esophageal varices, jaundice, hepatic encephalopathy.  Admitted with recurrent GI bleeding in the setting of significant alcohol use, due to esophageal varices which had significantly enlarged over the past month or so.  She has had banding x6, no further bleeding since then.  On PPI and octreotide drip.  She is jaundiced with a bilirubin of 5.3, has increased in  recent days, likely due to component of alcoholic hepatitis and worsening decompensation.  Ultrasound yesterday shows no vascular issues or no mass lesions in the liver.  Her mental status remains altered likely multifactorial due to hepatic encephalopathy and withdrawal from alcohol.  She remains intubated.  On lactulose enemas every 4 hours.  Very hesitant to give her oral lactulose and rifaximin at this time given her mental status and risk of placing Dobbhoff currently in the setting of recent banding.  Would like to avoid that if possible.  She is otherwise stable at this time.  Recommend continue supportive care with IV PPI, octreotide, antibiotics, rectal lactulose, management of withdrawal.  Her blood pressure has stabilized, off pressors at this time which is good, she is not having any active bleeding.  Monitor hemoglobin, goal greater than 7.  Long-term, difficult situation, high risk for recurrent alcohol abuse which could lead to further decompensation with poor prognosis.  She will need close follow-up with repeat banding in upcoming weeks, but needs to be compliant with office follow-ups etc, which she has not done in the past.  I discussed all of these issues with critical care, Dr. Doyle Askew today about plan.  Patient is currently stable.  PLAN: - continue IV protonix BID - continue IV octreotide for at least 72 hours - holding off on dobhoff / feeding tube today. Would ideally like  more time for mental status to improve for oral intake, and avoid feeding tube if possible given risks for recurrent bleeding with band placement in recent days - continue lactulose enemas - continue antibiotics for SBP prophylaxis. Holding off on steroids for component of alcoholic hepatitis for now, DF did not meet criteria  - wean sedation as tolerated, management of EtOH withdrawal per primary service - monitor for recurrent bleeding, keep Hgb goal > 7 - trend labs daily - LFTs, CBC, INR  Call with  questions or changes in her status. We will reassess her tomorrow. Hopefully with more time / supportive care her mental status will clear to allow oral nutrition in next few days.  Jolly Mango, MD [facility]

## 2022-07-28 NOTE — Progress Notes (Signed)
NAME:  Joann [name], MRN:  [id number], DOB:  1981-06-01, LOS: 3 ADMISSION DATE:  07/25/2022, CONSULTATION DATE:  07/25/2022 REFERRING MD:  Dr. Ralene Bathe, CHIEF COMPLAINT:  hematemesis    History of Present Illness:  41 year old female with PMH significant for current ETOH abuse, ETOH cirrhosis, DTs w/seizures, hepatic encephalopathy, HTN, and anxiety presenting to [facility] ER with complaints of nausea with multiple episodes of hematemesis since Tuesday morning with RUQ abdominal, weakness, unable to eat in three days, and several days of dark stools. Drinks a gallon of vodka daily, last drink was 11/14.  Hx of seizures when she tries to quit.  Multiple episodes of small dark bloody emesis in ER.  Hgb 9.7, INR 1.6, plts 76.   Admitted to PCCM.  Given FFP, PRBC, and txa.  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    Pertinent  Medical History  ETOH abuse, ETOH cirrhosis with grade 1 esophageal varices, portal HTN gastropathy, DTs w/seizures, hepatic encephalopathy, HTN, anxiety  Previously f/b [facility] Events: Including procedures, antibiotic start and stop dates in addition to other pertinent events   11/15 Admitted hematemesis, intubated/ EGD> esophageal varices s/p 6 bands, multiple blood products/ txa/ vit k 11/16 very hard to sedate then progressively hypotensive despite decreasing sedation> CTH neg, EEG/ cerebell neg, multiple blood products for coagulapathy/ txa/ vit k, tongue biting/ clenching  Interim History / Subjective:  No acute issues overnight, weaning this morning  Objective   Blood pressure 115/66, pulse (!) 126, temperature 99.7 F (37.6 C), temperature source Bladder, resp. rate 13, height 5' 4"  (1.626 m), weight 71 kg, SpO2 98 %.    Vent Mode: PRVC FiO2 (%):  [30 %] 30 % Set Rate:  [16 bmp] 16 bmp Vt Set:  [440 mL] 440 mL PEEP:  [5  cmH20] 5 cmH20 Plateau Pressure:  [11 cmH20-23 cmH20] 11 cmH20   Intake/Output Summary (Last 24 hours) at 07/28/2022 0805 Last data filed at 07/28/2022 0600 Gross per 24 hour  Intake 8268.38 ml  Output 4070 ml  Net 4198.38 ml   Filed Weights   07/25/22 0528 07/26/22 0449  Weight: 63.5 kg 71 kg   Examination: General appearance: 41 y.o., female, intubated Eyes: pinpoint, not tracking HENT: NCAT; less swollen tongue today Lungs: mech breath sounds bl, with normal respiratory effort CV: tachy RR, no murmur  Abdomen: Soft, non-tender; non-distended, BS present  Extremities: trace peripheral edema, warm Neuro: not following commands, +triggering vent   Labs/imaging reviewed  K 3.1 BUN/S Cr stable Albumin 2.7 Bili 5.3 stable  Hb 8.7 PLT 47  US doppler liver without evidence of clot, no ascites  Resolved Hospital Problem list    Assessment & Plan:   Upper GIB 2/2 bleeding esophageal varices and duodenum lesion ETOH cirrhosis ETOH abuse, ongoing, 1 gallon of vodka daily, last drink 11/14 Portal hypertensive gastropathy  Hepatic encephalopathy  - hepatitis discriminant score 26.3, steroids deferred at admission - EGD 11/15 by Dr. 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  Hgb 9.2> 8.  Will recheck q 12 or sooner if s/s bleeding - cont ceftriaxone for SBP ppx  - given worsening t. Bili, INR>  liver US with doppler.  Appreciate further recs from GI - correct coagulapathy as below  - recs for repeat EGD in 4 weeks  Acute hypoxic respiratory failure - intubated for EGD 11/15 and left on MV overnight to monitor for rebleeding of banded varices  P:  - full vent support, VAP bundle - will need to monitor tongue swelling and prevent clenching/ bleeding given coagulapathy> has bite block, need to ensure tongue remains by teeth - wean FiO2 as able for SpO2 >92%  - SAT/SBT today but mental status may preclude extubation  Shock  - likely multifactorial related to ABLA/ GIB as above and ETOH w/d but need to rule out sepsis component - abx broadened 11/17 given leukopenia, tmax 100.7 - continue ceftriaxone/ flagyl for now, stop vanc - 1/4 stap epi in BC> likely contaminant> monitor  - wean NE for MAP goal > 65 - monitor H/H / bleeding closely   Acute on chronic Anemia  Coagulapathy - acute on chronic thrombocytopenia, elevated INR - 2 units FFP now with TXA f/b 2 units PRBC 11/15 - 1 FFP, 1 platelets, 1 cryo, txa, vit K 11/16 - 1 FFP 11/17 P:  - trend cbc  Hypokalemia Hypocalcemia  Hypophos  Hypomagnesia  - correct electrolytes  Elevated bilirubin Transaminitis Unclear if cholestasis of sepsis, alc hep. Doppler without evidence of clot.  - trend   Hypoglycemia - likely related to liver dysfunction, critically illness, and NPO - cont D10 gtt, titrate for CBG ~120 - CBGs q4hr - add nutrition when able   High risk for ETOH withdrawal, hx of Dts ETOH abuse - daily 1 gallon of vodka, last drink 11/14 - continue phenobarb taper  (also on propofol) - high dose thiamine x 2 days then 139m daily (started 11/16) - cont folate IV - prn ativan q 2 - seizure precautions - will need TOC closer to discharge for options/ resources> ?inpt rehab facility  HTN - hold home coreg    Urinary retention - foley placed 11/16  - strict I/Os  Moderate Malnutrition related to chronic  illness - appreciate RD consult - NPO, unable to place OGT/ dobhoff given recent esophageal banding of varices - consider starting TPN if unable to liberate from MV soon, ?Monday   PAF - had brief episode of afib 11/15.  Remains in [place], and likely just related to critical illness  - remains NSR/ ST on telemetry.  Continue to monitor  - keep K> 4, Mag > 2  Best Practice (right click and "Reselect all SmartList Selections" daily)   Diet/type: NPO DVT prophylaxis: SCD GI prophylaxis: PPI Lines: Central line Foley:  Yes, and it is still needed Code Status:  full code Last date of multidisciplinary goals of care discussion:  Husband, TCelesta [phone](truck driver)> [place]  Advises if he can not answer phone to leave a message and he would call back.   Terrance updated by phone 11/16.  Mother updated 11/17  Critical care time: 40 mins    [facility]  07/28/2022, 8:05 AM  See  Amion for pager If no response to pager, please call PCCM consult pager After 7:00 pm call Elink

## 2022-07-29 DIAGNOSIS — K7682 Hepatic encephalopathy: Secondary | ICD-10-CM | POA: Diagnosis not present

## 2022-07-29 DIAGNOSIS — I8501 Esophageal varices with bleeding: Secondary | ICD-10-CM | POA: Diagnosis not present

## 2022-07-29 DIAGNOSIS — K7030 Alcoholic cirrhosis of liver without ascites: Secondary | ICD-10-CM | POA: Diagnosis not present

## 2022-07-29 DIAGNOSIS — J9601 Acute respiratory failure with hypoxia: Secondary | ICD-10-CM | POA: Diagnosis not present

## 2022-07-29 LAB — BPAM RBC
Blood Product Expiration Date: 202312102359
Blood Product Expiration Date: 202312102359
Blood Product Expiration Date: 202312112359
Blood Product Expiration Date: 202312112359
Blood Product Expiration Date: 202312112359
Blood Product Expiration Date: 202312162359
ISSUE DATE / TIME: 202311150913
ISSUE DATE / TIME: 202311150949
Unit Type and Rh: 5100
Unit Type and Rh: 5100
Unit Type and Rh: 5100
Unit Type and Rh: 5100
Unit Type and Rh: 5100
Unit Type and Rh: 5100

## 2022-07-29 LAB — TYPE AND SCREEN
ABO/RH(D): O POS
Antibody Screen: NEGATIVE
Unit division: 0
Unit division: 0
Unit division: 0
Unit division: 0
Unit division: 0
Unit division: 0

## 2022-07-29 LAB — GLUCOSE, CAPILLARY
Glucose-Capillary: 104 mg/dL — ABNORMAL HIGH (ref 70–99)
Glucose-Capillary: 118 mg/dL — ABNORMAL HIGH (ref 70–99)
Glucose-Capillary: 138 mg/dL — ABNORMAL HIGH (ref 70–99)
Glucose-Capillary: 115 mg/dL — ABNORMAL HIGH (ref 70–99)
Glucose-Capillary: 112 mg/dL — ABNORMAL HIGH (ref 70–99)

## 2022-07-29 LAB — CBC
HCT: 29.5 % — ABNORMAL LOW (ref 36.0–46.0)
Hemoglobin: 8.7 g/dL — ABNORMAL LOW (ref 12.0–15.0)
MCH: 27.7 pg (ref 26.0–34.0)
MCHC: 29.5 g/dL — ABNORMAL LOW (ref 30.0–36.0)
MCV: 93.9 fL (ref 80.0–100.0)
Platelets: 56 10*3/uL — ABNORMAL LOW (ref 150–400)
RBC: 3.14 MIL/uL — ABNORMAL LOW (ref 3.87–5.11)
RDW: 22 % — ABNORMAL HIGH (ref 11.5–15.5)
WBC: 6.7 10*3/uL (ref 4.0–10.5)
nRBC: 0 % (ref 0.0–0.2)

## 2022-07-29 LAB — COMPREHENSIVE METABOLIC PANEL
ALT: 11 U/L (ref 0–44)
AST: 76 U/L — ABNORMAL HIGH (ref 15–41)
Albumin: 2.6 g/dL — ABNORMAL LOW (ref 3.5–5.0)
Alkaline Phosphatase: 146 U/L — ABNORMAL HIGH (ref 38–126)
Anion gap: 8 (ref 5–15)
BUN: <5 mg/dL — ABNORMAL LOW (ref 6–20)
CO2: 21 mmol/L — ABNORMAL LOW (ref 22–32)
Calcium: 7.6 mg/dL — ABNORMAL LOW (ref 8.9–10.3)
Chloride: 112 mmol/L — ABNORMAL HIGH (ref 98–111)
Creatinine, Ser: 0.48 mg/dL (ref 0.44–1.00)
GFR, Estimated: >60 mL/min (ref >60)
Glucose, Bld: 152 mg/dL — ABNORMAL HIGH (ref 70–99)
Potassium: 2.7 mmol/L — CL (ref 3.5–5.1)
Sodium: 141 mmol/L (ref 135–145)
Total Bilirubin: 5.2 mg/dL — ABNORMAL HIGH (ref 0.3–1.2)
Total Protein: 7.6 g/dL (ref 6.5–8.1)

## 2022-07-29 LAB — PROTIME-INR
INR: 2.1 — ABNORMAL HIGH (ref 0.8–1.2)
Prothrombin Time: 23.7 seconds — ABNORMAL HIGH (ref 11.4–15.2)

## 2022-07-29 LAB — TRIGLYCERIDES: Triglycerides: 84 mg/dL (ref <150)

## 2022-07-29 MED ORDER — SODIUM CHLORIDE 0.9 % IV SOLN
2.0000 g | INTRAVENOUS | Status: AC
  Administered 2022-07-29: 2 g via INTRAVENOUS
  Filled 2022-07-29: qty 20

## 2022-07-29 MED ORDER — DEXMEDETOMIDINE HCL IN NACL 200 MCG/50ML IV SOLN: 0.0000 ug/kg/h | INTRAVENOUS | Status: DC

## 2022-07-29 MED ORDER — POTASSIUM CHLORIDE 10 MEQ/100ML IV SOLN
10.0000 meq | INTRAVENOUS | Status: AC
  Administered 2022-07-29 (×6): 10 meq via INTRAVENOUS
  Filled 2022-07-29 (×6): qty 100

## 2022-07-29 MED ORDER — DOCUSATE SODIUM 50 MG/5ML PO LIQD: 100.0000 mg | Freq: Two times a day (BID) | ORAL | Status: DC

## 2022-07-29 MED ORDER — POLYETHYLENE GLYCOL 3350 17 G PO PACK: 17.0000 g | PACK | Freq: Every day | ORAL | Status: DC

## 2022-07-29 NOTE — Progress Notes (Signed)
  Transition of Care [facility]) Screening Note   Patient Details  Name: Joann [name] Date of Birth: Feb 04, 1981   Transition of Care [facility] CM/SW Contact:    Kimber Relic, LCSW Phone Number: 07/29/2022, 10:24 AM    Transition of Care Department [facility]) has reviewed patient and no TOC needs have been identified at this time. We will continue to monitor patient advancement through interdisciplinary progression rounds. If new patient transition needs arise, please place a TOC consult.

## 2022-07-29 NOTE — Progress Notes (Signed)
NAME:  Joann [name], MRN:  [id number], DOB:  03/14/1981, LOS: 4 ADMISSION DATE:  07/25/2022, CONSULTATION DATE:  07/25/2022 REFERRING MD:  Dr. Ralene Bathe, CHIEF COMPLAINT:  hematemesis    History of Present Illness:  41 year old female with PMH significant for current ETOH abuse, ETOH cirrhosis, DTs w/seizures, hepatic encephalopathy, HTN, and anxiety presenting to [facility] ER with complaints of nausea with multiple episodes of hematemesis since Tuesday morning with RUQ abdominal, weakness, unable to eat in three days, and several days of dark stools. Drinks a gallon of vodka daily, last drink was 11/14.  Hx of seizures when she tries to quit.  Multiple episodes of small dark bloody emesis in ER.  Hgb 9.7, INR 1.6, plts 76.   Admitted to PCCM.  Given FFP, PRBC, and txa.  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    Pertinent  Medical History  ETOH abuse, ETOH cirrhosis with grade 1 esophageal varices, portal HTN gastropathy, DTs w/seizures, hepatic encephalopathy, HTN, anxiety  Previously f/b [facility] Events: Including procedures, antibiotic start and stop dates in addition to other pertinent events   11/15 Admitted hematemesis, intubated/ EGD> esophageal varices s/p 6 bands, multiple blood products/ txa/ vit k 11/16 very hard to sedate then progressively hypotensive despite decreasing sedation> CTH neg, EEG/ cerebell neg, multiple blood products for coagulapathy/ txa/ vit k, tongue biting/ clenching  Interim History / Subjective:  No acute issues overnight. Still minimally responsive with sedation weaned.  Objective   Blood pressure 139/85, pulse (!) 113, temperature 97.9 F (36.6 C), resp. rate 16, height 5' 4"  (1.626 m), weight 76 kg, SpO2 97 %.    Vent Mode: PRVC FiO2 (%):  [30 %] 30 % Set Rate:  [16 bmp] 16 bmp Vt Set:  [440 mL] 440 mL PEEP:  [5  cmH20] 5 cmH20 Pressure Support:  [12 cmH20] 12 cmH20 Plateau Pressure:  [10 cmH20-18 cmH20] 16 cmH20   Intake/Output Summary (Last 24 hours) at 07/29/2022 0735 Last data filed at 07/29/2022 0645 Gross per 24 hour  Intake 1835.24 ml  Output 5900 ml  Net -4064.76 ml   Filed Weights   07/25/22 0528 07/26/22 0449 07/29/22 0500  Weight: 63.5 kg 71 kg 76 kg   Examination: General appearance: 41 y.o., female, intubated Eyes: PERRL, not tracking HENT: NCAT; less tongue swelling Lungs: mech breath sounds bl, equal chest rise [id number] RR, no murmur  Abdomen: Soft, non-tender; non-distended, BS hypoactive Extremities: trace peripheral edema, warm Neuro: doesn't follow commands or withdraw to noxious stim off all sedation (though did get fentanyl push at 530, +cough/gag, opens eyes to voice   Labs/imaging reviewed  Labs pending  No new imaging  Resolved Hospital Problem list    Assessment & Plan:   Upper GIB 2/2 bleeding esophageal varices and duodenum lesion ETOH cirrhosis ETOH abuse, ongoing, 1 gallon of vodka daily, last drink 11/14 Portal hypertensive gastropathy  Hepatic encephalopathy  - discriminant score 26.3, steroids deferred at admission - EGD 11/15 by Dr. 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 s/p APC and clip  (prior EGD 06/26/22 showed one single column of grade I esophageal varices  Plan:  - trend CBC - Greatly appreciate assistance from [facility] - IV ppi BID - continue lactulose enemas, q4hrs  -  will need to consider some source of nutrition soon if unable to liberate from MV and not taking PO, TPN vs dobhoff (very high risk given banding) - stop ceftriaxone today (s/p course for SBP ppx)  - recs for repeat EGD in 4 weeks  Acute hypoxic respiratory failure Intubated for EGD 11/15 and left on MV overnight to monitor for  rebleeding of banded varices  - SAT/SBT with or without precedex, hopeful we'll be able to extubate - full vent support, VAP bundle - wean FiO2 as able for SpO2 >92%   Shock, resolved Likely multifactorial related to ABLA/ GIB as above and ETOH w/d but need to rule out sepsis component. - trend cbc as above - stopping ABX today - 1/4 stap epi in BC> likely contaminant> monitor   Acute on chronic Anemia  Coagulapathy - acute on chronic thrombocytopenia, elevated INR - 2 units FFP now with TXA f/b 2 units PRBC 11/15 - 1 FFP, 1 platelets, 1 cryo, txa, vit K 11/16 - 1 FFP 11/17 P:  - trend cbc  Hypokalemia Hypocalcemia  Hypophos  Hypomagnesia  - correct electrolytes  Elevated bilirubin Transaminitis Unclear if cholestasis of sepsis, alc hep. Doppler without evidence of clot.  - trend  Hypoglycemia Likely related to liver dysfunction, critically illness, and NPO - cont D10 gtt, titrate for CBG ~120 - CBGs q4hr - add nutrition when able   High risk for ETOH withdrawal, hx of Dts ETOH abuse - daily 1 gallon of vodka, last drink 11/14 - stopped phenobarb taper 11/18 given poor responsiveness off continuous sedation all day - high dose thiamine x 2 days then 183m daily (started 11/16) - cont folate IV - prn ativan q 2 - seizure precautions - will need TOC closer to discharge for options/ resources> ?inpt rehab facility  HTN - hold home coreg    Urinary retention - foley placed 11/16  - strict I/Os  Moderate Malnutrition related to chronic illness - appreciate RD consult - NPO, unable to place OGT/ dobhoff given recent esophageal banding of varices - consider starting TPN if unable to liberate from MV soon, ?Monday   PAF - had brief episode of afib 11/15.  Remains in [place], and likely just related to critical illness  - remains NSR/ ST on telemetry.  Continue to monitor  - keep K> 4, Mag > 2  Best Practice (right click and "Reselect all  SmartList Selections" daily)   Diet/type: NPO DVT prophylaxis: SCD GI prophylaxis: PPI Lines: Central line Foley:  Yes, and it is still needed Code Status:  full code Last date of multidisciplinary goals of care discussion:  Husband, TCelesta [phone](truck driver)> [place]  Advises if he can not answer phone to leave a message and he would call back.   Terrance updated by phone 11/16.  Mother updated 11/17  Critical care time: 33 mins    [place] 07/29/2022, 7:35 AM  See Amion for pager If no response to pager, please call PCCM consult pager After 7:00 pm call Elink

## 2022-07-29 NOTE — Progress Notes (Addendum)
Progress Note   Subjective  Patient remains nonresponsive. Not much propofol but has received fentantyl overnight and phenobarb previously. No bleeding per nursing staff. No acute events noted otherwise.   Objective   Vital signs in last 24 hours: Temp:  [97.9 F (36.6 C)-99.3 F (37.4 C)] 99 F (37.2 C) (11/19 0930) Pulse Rate:  [102-125] 104 (11/19 0930) Resp:  [10-22] 12 (11/19 0930) BP: (114-149)/(60-99) 129/76 (11/19 0930) SpO2:  [93 %-98 %] 95 % (11/19 0930) FiO2 (%):  [30 %] 30 % (11/19 0834) Weight:  [76 kg] 76 kg (11/19 0500) Last BM Date : 07/28/22 General:    AA female in NAD, intubated - not awake or responsive Abdomen:  Soft, nontender and nondistended.    Intake/Output from previous day: 11/18 0701 - 11/19 0700 In: 1835.2 [I.V.:1049.3; IV Piggyback:785.9] Out: 5900 [Urine:1800; FIEPP:2951] Intake/Output this shift: No intake/output data recorded.  Lab Results: Recent Labs    07/27/22 1710 07/28/22 0539 07/29/22 0914  WBC 7.1 8.6 6.7  HGB 8.1* 8.7* 8.7*  HCT 27.7* 29.3* 29.5*  PLT 31* 41* 56*   BMET Recent Labs    07/27/22 0338 07/28/22 0539 07/28/22 2115  NA 139 141 142  K 3.4* 3.1* 3.2*  CL 111 114* 117*  CO2 22 23 22   GLUCOSE 132* 164* 128*  BUN <5* <5* <5*  CREATININE 0.57 0.62 0.48  CALCIUM 7.2* 7.7* 7.2*   LFT Recent Labs    07/28/22 0539  PROT 7.6  ALBUMIN 2.7*  AST 105*  ALT 11  ALKPHOS 150*  BILITOT 5.3*   PT/INR Recent Labs    07/28/22 0539 07/29/22 0506  LABPROT 20.6* 23.7*  INR 1.8* 2.1*    Studies/Results: US ABDOMEN LIMITED WITH LIVER DOPPLER  Result Date: 07/27/2022 CLINICAL DATA:  Abnormal liver function tests EXAM: DUPLEX ULTRASOUND OF LIVER TECHNIQUE: Color and duplex Doppler ultrasound was performed to evaluate the hepatic in-flow and out-flow vessels. COMPARISON:  CT abdomen pelvis 02/24/2022 FINDINGS: Liver: Diffuse heterogeneity of hepatic parenchymal echogenicity. Nodular hepatic contours  consistent with cirrhosis. 0.8 Main Portal Vein size: 18 cm Portal Vein Velocities Main Prox:  18 cm/sec Main Mid: 28 cm/sec Main Dist:  31 cm/sec Right: 20 cm/sec Left: 24 cm/sec Hepatic Vein Velocities Right:  27 cm/sec Middle:  28 cm/sec Left:  30 cm/sec IVC: Present and patent with normal respiratory phasicity. Hepatic Artery Velocity:  153 cm/sec Splenic Vein Velocity:  16 cm/sec Spleen: 9.8 cm x 12.7 cm x 4.3 cm with a total volume of 326 cm^3 (411 cm^3 is upper limit normal) Portal Vein Occlusion/Thrombus: No Splenic Vein Occlusion/Thrombus: No Ascites: None Varices: None Gallbladder surgically absent. Common bile duct was obscured by shadowing bowel gas. IMPRESSION: 1. Cirrhotic liver morphology. 2. Hepatofugal flow seen in portal vein consistent with portal hypertension. Electronically Signed   By: Miachel Roux M.D.   On: 07/27/2022 12:49       Assessment / Plan:    41 year old female with alcoholic cirrhosis, actively drinking, decompensated with esophageal varices, jaundice, hepatic encephalopathy.   Admitted with recurrent GI bleeding in the setting of significant alcohol use, due to esophageal varices She has had banding x 6 on 07/25/22, no further bleeding since then.  On PPI, completed octreotide drip.  She is jaundiced with a bilirubin of 5.3, has increased in recent days, likely due to component of alcoholic hepatitis and worsening decompensation. Liver enzymes stable this AM. Ultrasound shows no vascular issues or no mass lesions in the  liver.  INR is drifting up, she has already received a few doses of vitamin K.  Main issue currently is that she has not been on much sedation and mental status not improving. Spoke with primary service today - continue to think this is multifactorial due to underlying hepatic encephalopathy with recent administration of fentanyl and phenobarb, with component of EtOH withdrawal. Remains intubated. Receiving rectal lactulose. She is really not any better in  this regard over the pat few days. Ideally would like to place NG / Dobbhoff for enteral feedings and to give oral lactulose / rifaximin, however risks of this are precipitating bleeding with recent banding. I discussed potentially placing a feeding tube today with primary team, they will cautious place thinnest tube possible for lactulose / enteral feeds if no improvement this afternoon. Fortunately she has done quite well from a bleeding perspective without recurrence.   Continue supportive care with IV PPI, antibiotics, rectal lactulose, and management of withdrawal. Monitor hemoglobin, goal greater than 7.   Long-term, difficult situation, high risk for recurrent alcohol abuse which could lead to further decompensation with poor prognosis.  She will need close follow-up with repeat banding in upcoming weeks, but needs to be compliant with office follow-ups etc, which she has not done in the past.    PLAN: - continue IV protonix BID - discussion as above with primary service about placing feeding today - plan on placing thinnest tube possible for oral lactulose / enteral feeds today given no improvement in mental status. Monitor for recurrent bleeding, tube should not be on suction.  - continue lactulose enemas - continue antibiotics for SBP prophylaxis. Holding off on steroids for component of alcoholic hepatitis for now, LAEs stable - monitor for recurrent bleeding, keep Hgb goal > 7 - trend labs daily - LFTs, CBC, INR   Call with questions. Dr. Rush Landmark to assume GI inpatient service tomorrow.  Jolly Mango, MD [facility]

## 2022-07-30 ENCOUNTER — Inpatient Hospital Stay: Payer: Self-pay

## 2022-07-30 ENCOUNTER — Inpatient Hospital Stay (HOSPITAL_COMMUNITY): Payer: Medicaid Other

## 2022-07-30 DIAGNOSIS — J9601 Acute respiratory failure with hypoxia: Secondary | ICD-10-CM | POA: Diagnosis not present

## 2022-07-30 DIAGNOSIS — F10939 Alcohol use, unspecified with withdrawal, unspecified: Secondary | ICD-10-CM | POA: Diagnosis not present

## 2022-07-30 DIAGNOSIS — I8511 Secondary esophageal varices with bleeding: Secondary | ICD-10-CM | POA: Diagnosis not present

## 2022-07-30 DIAGNOSIS — K922 Gastrointestinal hemorrhage, unspecified: Secondary | ICD-10-CM | POA: Diagnosis not present

## 2022-07-30 DIAGNOSIS — G9341 Metabolic encephalopathy: Secondary | ICD-10-CM

## 2022-07-30 DIAGNOSIS — K7030 Alcoholic cirrhosis of liver without ascites: Secondary | ICD-10-CM | POA: Diagnosis not present

## 2022-07-30 LAB — GLUCOSE, CAPILLARY
Glucose-Capillary: 108 mg/dL — ABNORMAL HIGH (ref 70–99)
Glucose-Capillary: 112 mg/dL — ABNORMAL HIGH (ref 70–99)
Glucose-Capillary: 116 mg/dL — ABNORMAL HIGH (ref 70–99)
Glucose-Capillary: 118 mg/dL — ABNORMAL HIGH (ref 70–99)
Glucose-Capillary: 121 mg/dL — ABNORMAL HIGH (ref 70–99)
Glucose-Capillary: 122 mg/dL — ABNORMAL HIGH (ref 70–99)
Glucose-Capillary: 94 mg/dL (ref 70–99)

## 2022-07-30 LAB — CULTURE, BLOOD (ROUTINE X 2)
Culture: NO GROWTH
Culture: NO GROWTH
Special Requests: ADEQUATE
Special Requests: ADEQUATE

## 2022-07-30 LAB — COMPREHENSIVE METABOLIC PANEL
ALT: 10 U/L (ref 0–44)
AST: 76 U/L — ABNORMAL HIGH (ref 15–41)
Albumin: 2.4 g/dL — ABNORMAL LOW (ref 3.5–5.0)
Alkaline Phosphatase: 134 U/L — ABNORMAL HIGH (ref 38–126)
Anion gap: 4 — ABNORMAL LOW (ref 5–15)
BUN: <5 mg/dL — ABNORMAL LOW (ref 6–20)
CO2: 23 mmol/L (ref 22–32)
Calcium: 7.5 mg/dL — ABNORMAL LOW (ref 8.9–10.3)
Chloride: 114 mmol/L — ABNORMAL HIGH (ref 98–111)
Creatinine, Ser: <0.3 mg/dL — ABNORMAL LOW (ref 0.44–1.00)
Glucose, Bld: 120 mg/dL — ABNORMAL HIGH (ref 70–99)
Potassium: 3.3 mmol/L — ABNORMAL LOW (ref 3.5–5.1)
Sodium: 141 mmol/L (ref 135–145)
Total Bilirubin: 4.4 mg/dL — ABNORMAL HIGH (ref 0.3–1.2)
Total Protein: 7.3 g/dL (ref 6.5–8.1)

## 2022-07-30 LAB — PHOSPHORUS: Phosphorus: 2 mg/dL — ABNORMAL LOW (ref 2.5–4.6)

## 2022-07-30 LAB — BPAM FFP
Blood Product Expiration Date: 202311180649
Blood Product Expiration Date: 202311222359
ISSUE DATE / TIME: 202311170701
ISSUE DATE / TIME: 202311170902
Unit Type and Rh: 5100
Unit Type and Rh: 5100

## 2022-07-30 LAB — PREPARE FRESH FROZEN PLASMA

## 2022-07-30 LAB — CBC
HCT: 29.7 % — ABNORMAL LOW (ref 36.0–46.0)
Hemoglobin: 8.7 g/dL — ABNORMAL LOW (ref 12.0–15.0)
MCH: 28.1 pg (ref 26.0–34.0)
MCHC: 29.3 g/dL — ABNORMAL LOW (ref 30.0–36.0)
MCV: 95.8 fL (ref 80.0–100.0)
Platelets: 57 10*3/uL — ABNORMAL LOW (ref 150–400)
RBC: 3.1 MIL/uL — ABNORMAL LOW (ref 3.87–5.11)
RDW: 23 % — ABNORMAL HIGH (ref 11.5–15.5)
WBC: 5.2 10*3/uL (ref 4.0–10.5)
nRBC: 0 % (ref 0.0–0.2)

## 2022-07-30 LAB — PROTIME-INR
INR: 1.9 — ABNORMAL HIGH (ref 0.8–1.2)
Prothrombin Time: 21.8 seconds — ABNORMAL HIGH (ref 11.4–15.2)

## 2022-07-30 LAB — MAGNESIUM: Magnesium: 1.4 mg/dL — ABNORMAL LOW (ref 1.7–2.4)

## 2022-07-30 MED ORDER — PROSOURCE TF20 ENFIT COMPATIBL EN LIQD
60.0000 mL | Freq: Every day | ENTERAL | Status: DC
  Administered 2022-08-01 – 2022-08-11 (×10): 60 mL
  Filled 2022-07-30 (×12): qty 60

## 2022-07-30 MED ORDER — TRAVASOL 10 % IV SOLN: INTRAVENOUS | Status: DC

## 2022-07-30 MED ORDER — MAGNESIUM SULFATE 2 GM/50ML IV SOLN: 2.0000 g | Freq: Once | INTRAVENOUS | Status: DC

## 2022-07-30 MED ORDER — TRAVASOL 10 % IV SOLN
INTRAVENOUS | Status: DC
  Filled 2022-07-30: qty 360

## 2022-07-30 MED ORDER — VITAL 1.5 CAL PO LIQD
1000.0000 mL | ORAL | Status: DC
  Administered 2022-07-30 – 2022-08-08 (×9): 1000 mL
  Filled 2022-07-30 (×19): qty 1000

## 2022-07-30 MED ORDER — LACTULOSE 10 GM/15ML PO SOLN
30.0000 g | Freq: Three times a day (TID) | ORAL | Status: DC
  Administered 2022-07-30 – 2022-08-03 (×11): 30 g
  Filled 2022-07-30 (×12): qty 45

## 2022-07-30 MED ORDER — RIFAXIMIN 550 MG PO TABS
550.0000 mg | ORAL_TABLET | Freq: Two times a day (BID) | ORAL | Status: DC
  Administered 2022-07-30 – 2022-08-11 (×23): 550 mg
  Filled 2022-07-30 (×23): qty 1

## 2022-07-30 MED ORDER — MAGNESIUM SULFATE 4 GM/100ML IV SOLN
4.0000 g | Freq: Once | INTRAVENOUS | Status: AC
  Administered 2022-07-30: 4 g via INTRAVENOUS
  Filled 2022-07-30: qty 100

## 2022-07-30 MED ORDER — POTASSIUM PHOSPHATES 15 MMOLE/5ML IV SOLN
15.0000 mmol | Freq: Once | INTRAVENOUS | Status: AC
  Administered 2022-07-30: 15 mmol via INTRAVENOUS
  Filled 2022-07-30: qty 5

## 2022-07-30 MED ORDER — POTASSIUM CHLORIDE 10 MEQ/100ML IV SOLN
10.0000 meq | INTRAVENOUS | Status: AC
  Administered 2022-07-30 (×6): 10 meq via INTRAVENOUS
  Filled 2022-07-30 (×6): qty 100

## 2022-07-30 MED ORDER — DEXTROSE 10 % IV SOLN: INTRAVENOUS | Status: DC

## 2022-07-30 MED ORDER — INSULIN ASPART 100 UNIT/ML IJ SOLN
0.0000 [IU] | INTRAMUSCULAR | Status: DC
  Administered 2022-08-02 – 2022-08-04 (×4): 1 [IU] via SUBCUTANEOUS
  Administered 2022-08-05: 2 [IU] via SUBCUTANEOUS
  Administered 2022-08-05 – 2022-08-13 (×8): 1 [IU] via SUBCUTANEOUS

## 2022-07-30 MED ORDER — LACTULOSE ENEMA
300.0000 mL | Freq: Two times a day (BID) | ORAL | Status: DC
  Filled 2022-07-30: qty 300

## 2022-07-30 NOTE — Progress Notes (Addendum)
Nutrition Follow-up  DOCUMENTATION CODES:   Not applicable  INTERVENTION:  - Monitor magnesium, potassium, and phosphorus BID for at least 3 days, MD to replete as needed, as pt is at risk for refeeding syndrome given malnutrition, NPO x5 days, and consistently low potassium.  - Plan to start TPN tonight. TPN management per Pharmacy.  Discussed patient with pharmacist: plan to start at 68m/hr which will provide 727 kcal (10kcal/kg), 36g protein.  - Continue thiamine supplementation due to refeeding risk.  - Continue multivitamin in TPN and folic acid supplementation to support micronutrient needs given history of alcohol abuse.  - Monitor weight trends closely.    ADDENDUM (17:02) Per GI, RN to place dobhoff and plan to hold off on TPN now. Received consult to start enteral nutrition.   Recommend below TF regimen: Initiate tube feeding via Dobhoff (once placement verified) Vital 1.5 at 55 ml/h (1320 ml per day) Start at 118mhr and advance by 1048m12H to goal Prosource TF20 60 ml daily Provides 2060 kcal, 109 gm protein, 1008 ml free water daily  - Continue thiamine supplementation due to refeeding risk.  - Monitor electrolytes closely.  Informed RN of plan to start at 45m65m due to risk of refeeding.    NUTRITION DIAGNOSIS:   Moderate Malnutrition related to chronic illness (cirrhosis and esophageal varices) as evidenced by percent weight loss, mild fat depletion, mild muscle depletion. *ongoing  GOAL:   Patient will meet greater than or equal to 90% of their needs *not being met  MONITOR:   Vent status  REASON FOR ASSESSMENT:   Other (Comment) (verbal consult to start TPN)    ASSESSMENT:   Pt is a 41yo10yoith PMH of alcohol abuse, cirrhosis, recent upper GI bleed, DTs w/seizures, hepatic encephalopathy, HTN, and anxiety who presents with vomiting blood x1 day and several days of melena.  Patient remains intubated at time of visit, no family at  bedside.  Patient has been NPO since admission due to inability to place feeding tube per GI due to esophageal varices banding. Plan to start TPN today.   Pt discussed with MD and pharmacist. Suspect patient at high risk for refeeding syndrome due to prolonged NPO (5 days), malnutrition, and consistently low potassium requiring replacements. Pharmacist also notes ammonia noted to be elevated. Will plan to start patient around 10kcal/kg for refeeding risk and low protein while with high ammonia and hepatic encephalopathy. Will monitor closely for advancement ability. Patient has been receiving D10 at 50mL25mwhich provides 408 kcals over 24 hours, will be discontinued once starting TPN tonight.   Medications reviewed and include: Folic acid, thiamine, Potassium replacements, D10 _0 /hr = 408 kcals over 24 hours  Labs reviewed:   Latest Reference Range & Units 07/28/22 05:39 07/28/22 21:15 07/29/22 09:14 07/30/22 05:22  Potassium 3.5 - 5.1 mmol/L 3.1 (L) 3.2 (L) 2.7 (LL) 3.3 (L)  (LL): Data is critically low (L): Data is abnormally low   Latest Reference Range & Units 07/30/22 05:22  Phosphorus 2.5 - 4.6 mg/dL 2.0 (L)  Magnesium 1.7 - 2.4 mg/dL 1.4 (L)  (L): Data is abnormally low   Diet Order:   Diet Order     None       EDUCATION NEEDS:   Not appropriate for education at this time  Skin:  Skin Assessment: Skin Integrity Issues: Skin Integrity Issues:: Unstageable Unstageable: tongue  Last BM:  11/20  Height:  Ht Readings from Last 1 Encounters:  07/28/22 _1  (1.626 m)  Weight:  Wt Readings from Last 1 Encounters:  07/29/22 76 kg    BMI:  Body mass index is 28.76 kg/m.  Estimated Nutritional Needs:  Kcal:  1900-2250 kcal Protein:  85-110 grams Fluid:  >/= 1.9L    Samson Frederic RD, LDN For contact information, refer to [facility].

## 2022-07-30 NOTE — Progress Notes (Addendum)
GI team called the pharmacy to inform us to d/c the TPN order for today.  Pharmacy will sign off for TPN.  Re-consult [place] if need further assistance.  Dia Sitter, PharmD, BCPS 07/30/2022 3:11 PM

## 2022-07-30 NOTE — Progress Notes (Signed)
NG/OG tubed dislodged.  RN inserted air to check placement of NG/OG tube - moving air was heard.  Administered lactulose without any issue.  After tube feeding was set up and initiated, NG/OG tube was observed on patient's chest.  E-link made aware of situation.

## 2022-07-30 NOTE — Progress Notes (Signed)
NAME:  Joann [name], MRN:  [id number], DOB:  [birth year], LOS: 5 ADMISSION DATE:  07/25/2022, CONSULTATION DATE:  07/25/2022 REFERRING MD:  Dr. Ralene Bathe, CHIEF COMPLAINT:  hematemesis    History of Present Illness:  41 year old female with PMH significant for current ETOH abuse, ETOH cirrhosis, DTs w/seizures, hepatic encephalopathy, HTN, and anxiety presenting to [facility] ER with complaints of nausea with multiple episodes of hematemesis since Tuesday morning with RUQ abdominal, weakness, unable to eat in three days, and several days of dark stools. Drinks a gallon of vodka daily, last drink was 11/14.  Hx of seizures when she tries to quit.  Multiple episodes of small dark bloody emesis in ER.  Hgb 9.7, INR 1.6, plts 76.   Admitted to PCCM.  Given FFP, PRBC, and txa.  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    Pertinent  Medical History  ETOH abuse, ETOH cirrhosis with grade 1 esophageal varices, portal HTN gastropathy, DTs w/seizures, hepatic encephalopathy, HTN, anxiety  Previously f/b [facility] Events: Including procedures, antibiotic start and stop dates in addition to other pertinent events   11/15 Admitted hematemesis, intubated/ EGD> esophageal varices s/p 6 bands, multiple blood products/ txa/ vit k 11/16 very hard to sedate then progressively hypotensive despite decreasing sedation> CTH neg, EEG/ cerebell neg, multiple blood products for coagulapathy/ txa/ vit k, tongue biting/ clenching  Interim History / Subjective:  Minimally responsive this am. On minimal vent settings Received fentanyl 50 mcg last night and total 150 mcg prior to shift changes  Objective   Blood pressure (!) 151/82, pulse (!) 119, temperature 97.9 F (36.6 C), resp. rate 14, height 5' 4"  (1.626 m), weight 76 kg, SpO2 95 %.    Vent Mode: PRVC FiO2 (%):  [30 %] 30 % Set  Rate:  [12 bmp] 12 bmp Vt Set:  [440 mL] 440 mL PEEP:  [5 cmH20] 5 cmH20 Plateau Pressure:  [11 cmH20-26 cmH20] 12 cmH20   Intake/Output Summary (Last 24 hours) at 07/30/2022 9326 Last data filed at 07/30/2022 0630 Gross per 24 hour  Intake 5789.87 ml  Output 5145 ml  Net 644.87 ml   Filed Weights   07/25/22 0528 07/26/22 0449 07/29/22 0500  Weight: 63.5 kg 71 kg 76 kg   Physical Exam: General: Well-appearing, no acute distress HENT: [place], AT, ETT in place Eyes: EOMI, no scleral icterus Respiratory: Clear to auscultation bilaterally.  No crackles, wheezing or rales Cardiovascular: RRR, -M/R/G, no JVD GI: BS+, soft, nontender Extremities:-Edema,-tenderness Neuro: Does not open eyes or withdraw. Spontaneous upper extremity movement. +cough/gag. Recent fentanyl boluses this am  K 3.3  Resolved Hospital Problem list    Assessment & Plan:   Acute metabolic encephalopathy CT head NAICA. EEG 11/17 neg - Minimized sedating medications - Lactulose. Last ammonia 118  Upper GIB 2/2 bleeding esophageal varices and duodenum lesion ETOH cirrhosis ETOH abuse, ongoing, 1 gallon of vodka daily, last drink 11/14 Portal hypertensive gastropathy  Hepatic encephalopathy  - discriminant score 26.3, steroids deferred at admission - EGD 11/15 by Dr. 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 s/p APC and clip  (prior EGD 06/26/22 showed one single column of grade I esophageal varices  - Completed SBP ppx Plan:  - trend CBC - IV PPI BID -  continue lactulose enemas, q4hrs  - D10 gtt - Defer to GI regarding feeding tube - recs for repeat EGD in 4 weeks  Acute hypoxic respiratory failure Intubated for EGD 11/15 and left on MV overnight to monitor for rebleeding of banded varices  - SAT/SBT with or without precedex, hopeful we'll be able to extubate - full vent  support, VAP bundle - wean FiO2 as able for SpO2 >92%   Shock, resolved Likely multifactorial related to ABLA/ GIB as above and ETOH w/d but need to rule out sepsis component. - trend cbc as above - stopping ABX today - 1/4 stap epi in BC> likely contaminant> monitor   Acute on chronic Anemia  Coagulapathy - acute on chronic thrombocytopenia, elevated INR - 2 units FFP now with TXA f/b 2 units PRBC 11/15 - 1 FFP, 1 platelets, 1 cryo, txa, vit K 11/16 - 1 FFP 11/17 P:  - trend cbc  Hypokalemia Hypocalcemia  Hypophos  Hypomagnesia  - correct electrolytes - Replete K  Elevated bilirubin Transaminitis - stable Unclear if cholestasis of sepsis, alc hep. Doppler without evidence of clot.  - trend  Hypoglycemia Likely related to liver dysfunction, critically illness, and NPO - cont D10 gtt, titrate for CBG ~120 - CBGs q4hr - add nutrition when able   High risk for ETOH withdrawal, hx of Dts ETOH abuse - daily 1 gallon of vodka, last drink 11/14 - stopped phenobarb taper 11/18 given poor responsiveness off continuous sedation all day - high dose thiamine x 2 days then 150m daily (started 11/16) - cont thiamine, folate IV - prn ativan q 2 - seizure precautions - will need TOC closer to discharge for options/ resources> ?inpt rehab facility  HTN - hold home coreg    Urinary retention - foley placed 11/16  - strict I/Os  Moderate Malnutrition related to chronic illness - appreciate RD consult - NPO, unable to place OGT/ dobhoff given recent esophageal banding of varices - consider starting TPN if unable to liberate from MV soon, ?Monday   PAF - had brief episode of afib 11/15.  Remains [place], and likely just related to critical illness  - remains NSR/ ST on telemetry.  Continue to monitor  - keep K> 4, Mag > 2  Best Practice (right click and "Reselect all SmartList Selections" daily)   Diet/type: NPO. D10 gtt DVT prophylaxis: SCD GI  prophylaxis: PPI Lines: Central line Foley:  Yes, and it is still needed Code Status:  full code Last date of multidisciplinary goals of care discussion:  Husband, TCelesta [phone](truck driver)> [place]  Advises if he can not answer phone to leave a message and he would call back.   Terrance updated by phone 11/16.  Mother updated 11/17  Critical care time: 36 mins    The patient is critically ill with multiple organ systems failure and requires high complexity decision making for assessment and support, frequent evaluation and titration of therapies, application of advanced monitoring technologies and extensive interpretation of multiple databases.   JRodman Pickle M.D. [facility] Medicine 07/30/2022 8:22 AM   Please see Amion for pager number to reach on-call Pulmonary and Critical Care Team.

## 2022-07-30 NOTE — Progress Notes (Deleted)
07/30/2022 Joann [name] [id number] 1980-10-30  Referring provider: No ref. provider found Primary GI doctor: Dr. Rush Landmark  ASSESSMENT AND PLAN:   There are no diagnoses linked to this encounter.   Patient Care Team: Elodia Florence., MD as PCP - General (Family Medicine)  HISTORY OF PRESENT ILLNESS: 40 y.o. female with a past medical history of hypertension, anxiety, history of alcohol induced cirrhosis with hepatic encephalopathy, IDA and others listed below presents for evaluation of recent hospitalization for hematemesis.  Multiple admissions due to decompensated EtOH cirrhosis. 02/2022 hospital admission altered mental status and alcohol withdrawal 06/2022 altered mental status due to hepatic encephalopathy and alcohol withdrawal.  MSSA sepsis at that time. 06/26/2022 EGD with Dr. Bryan Lemma finding of a single column of grade 1/small varices in the lower one third of the esophagus, these were not treated and 1 small cratered esophageal ulcer measuring 4 mm felt probably a pressure related ulcer from core track, noted to have moderate portal gastropathy.  07/25/2022 hospital admission continued drinking a gallon of vodka every day developed hematemesis 07/25/2022 endoscopy showed grade 2 and grade 3 esophageal varices with stigmata of recent bleeding distal esophagus status post banding, 1 cm hiatal hernia, portal hypertensive gastropathy throughout, single angiodysplastic lesion in duodenum status post APC and MR clip normal rest of the duodenum.  She  reports that she has never smoked. She has never used smokeless tobacco. She reports current alcohol use. She reports that she does not use drugs.  Current Medications:       Facility-Administered Medications Ordered in Other Visits (Cardiovascular):    norepinephrine (LEVOPHED) 27m in 254m(0.016 mg/mL) premix infusion      Facility-Administered Medications Ordered in Other Visits (Analgesics):    fentaNYL  (SUBLIMAZE) injection 50-200 mcg    Facility-Administered Medications Ordered in Other Visits (Hematological):    folic acid injection 1 mg    Facility-Administered Medications Ordered in Other Visits (Other):    0.9 %  sodium chloride infusion (Manually program via Guardrails IV Fluids)   0.9 %  sodium chloride infusion   Chlorhexidine Gluconate Cloth 2 % PADS 6 each   dexmedetomidine (PRECEDEX) 200 MCG/50ML (4 mcg/mL) infusion   dextrose 10 % infusion   lactulose (CHRONULAC) enema 200 gm   LORazepam (ATIVAN) injection 2 mg   Oral care mouth rinse   Oral care mouth rinse   pantoprazole (PROTONIX) injection 40 mg   [COMPLETED] thiamine (VITAMIN B1) 500 mg in normal saline (50 mL) IVPB **IN FOLLOWED-BY LINKED GROUP WITH** thiamine (VITAMIN B1) 250 mg in sodium chloride 0.9 % 50 mL IVPB No current facility-administered medications for this visit. No current outpatient medications on file.  Medical History:  Past Medical History:  Diagnosis Date   [redacted] weeks gestation of pregnancy 03/22/2020   Alcohol abuse    Alcohol withdrawal delirium ([facility]/04/2022   Alcohol-induced acute pancreatitis    Alcoholic ketoacidosis 1292/42/6834 Anxiety    DTs [place]irium tremens) [place] [place]   Elevated LFTs 10/24/2018   Encephalopathy acute    Hepatic encephalopathy ([facility]/15/2021   Hypertension    Hypokalemia 04/02/2019   Hypotension 02/24/2022   Liver disease    Allergies:  Allergies  Allergen Reactions   Latex Hives     Surgical History:  She  has a past surgical history that includes Ectopic pregnancy surgery; Esophagogastroduodenoscopy (egd) with propofol (N/A, 12/27/2019); Esophagogastroduodenoscopy (egd) with propofol (N/A, 06/26/2022); Esophagogastroduodenoscopy (egd) with propofol (N/A, 07/25/2022); Hemostasis clip placement (07/25/2022); esophageal  banding (07/25/2022); and Hot hemostasis (N/A, 07/25/2022). Family History:  Her family history includes Alcohol abuse in her father and  mother; Diabetes in her father; Healthy in her brother, brother, brother, and mother; Hypertension in her maternal grandfather and maternal grandmother.  REVIEW OF SYSTEMS  : All other systems reviewed and negative except where noted in the History of Present Illness.  PHYSICAL EXAM: There were no vitals taken for this visit. General:   Pleasant, well developed female in no acute distress Head:   Normocephalic and atraumatic. Eyes:  sclerae anicteric,conjunctive pink  Heart:   {HEART EXAM HEM/ONC:21750} Pulm:  Clear anteriorly; no wheezing Abdomen:   {BlankSingle:19197::"Distended","Ridged","Soft"}, {BlankSingle:19197::"Flat","Obese","Non-distended"} AB, {BlankSingle:19197::"Absent","Hyperactive, tinkling","Hypoactive","Sluggish","Active"} bowel sounds. {actendernessAB:27319} tenderness {anatomy; site abdomen:5010}. {BlankMultiple:19196::"Without guarding","With guarding","Without rebound","With rebound"}, No organomegaly appreciated. Rectal: {acrectalexam:27461} Extremities:  {With/Without:304960234} edema. Msk: Symmetrical without gross deformities. Peripheral pulses intact.  Neurologic:  Alert and  oriented x4;  No focal deficits.  Skin:   Dry and intact without significant lesions or rashes. Psychiatric:  Cooperative. Normal mood and affect.    Vladimir Crofts, PA-C 8:00 AM

## 2022-07-30 NOTE — Progress Notes (Cosign Needed Addendum)
Daily Progress Note  Hospital Day: 6  Chief Complaint: decompensated cirrhosis, variceal bleed, encephalopathy  Brief History [name] with a pmh not limited to Etoh abuse, DTs with seizures, Etoh cirrhosis, IDA.  Seen in consultation by [place] on 11/15 for hematemesis      Assessment:  # 41 yo female with decompensated Etoh cirrhosis.  Admitted with hematemesis s/p EGD with banding of Grade II and grade III esophageal varices with stigmata of recent bleeding   So far no evidence for recurrent GI bleeding.  Hgb  stable to 8.7.   [place] negative for liver lesions or ascites.   Persistent encephalopathy - Head CT scan on 11/16 without acute findings. EEG - nonspecific encephalopathy.  No longer requiring Pb for Etoh withdrawal. Minimally responsive today ( without sedation) but got Fentanyl at 6:30 am. Getting Q 4 lactulose enemas but still with severe encephalopathy  Hyperbilirubinemia - secondary to hepatic decompensation, Etoh hepatitis. Today's liver chemistries reviewed. Bilirubin stable, slightly better overnight from 5.2 >> 4.4 . Enzymes stable in pattern of Etoh ( AST 76 / ALT 10). Holding off on steroids for Etoh hepatitis.     INR still elevated at 1.9 despite multiple dose of Vit K.    Plan:    Continue Lactulose enemas Q 4 hours Continue BID IV PPI Needs plan for nutrition. Recommend TNA over placement of small bowel feeding tube due to increased risk for bleeding due to recent banding.    Addendum: Spoke with Dr. Havery Moros who rounded on her yesterday. He had spoke with the primary team about placing a small bowel feeing tube this wasn't done. I have spoken with pharmacy, they will hold off on starting TNA for now. RN placing Dobhoff. Once placement verified with consult Nutrition to start enteral feedings and also will order Lactulose and Xifaxan via feeding tube.    Objective   Endoscopic studies:  07/25/22 EGD -No gross lesions in the proximal esophagus and in  the mid esophagus. - Grade II and grade III esophageal varices with stigmata of recent bleeding found in the distal esophagus. Completely eradicated. Banded. - Z-line irregular, 39 cm from the incisors. - 1 cm hiatal hernia. - Portal hypertensive gastropathy throughout. 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 Treated with argon plasma coagulation (APC). Clip (MR conditional) was placed. Clip manufacturer: [employer]. - No gross lesions in the duodenal bulb, in the first portion of the duodenum and in the second portion of the duodenum.    Imaging:  US ABDOMEN LIMITED WITH LIVER DOPPLER  Result Date: 07/27/2022 CLINICAL DATA:  Abnormal liver function tests EXAM: DUPLEX ULTRASOUND OF LIVER TECHNIQUE: Color and duplex Doppler ultrasound was performed to evaluate the hepatic in-flow and out-flow vessels. COMPARISON:  CT abdomen pelvis 02/24/2022 FINDINGS: Liver: Diffuse heterogeneity of hepatic parenchymal echogenicity. Nodular hepatic contours consistent with cirrhosis. 0.8 Main Portal Vein size: 18 cm Portal Vein Velocities Main Prox:  18 cm/sec Main Mid: 28 cm/sec Main Dist:  31 cm/sec Right: 20 cm/sec Left: 24 cm/sec Hepatic Vein Velocities Right:  27 cm/sec Middle:  28 cm/sec Left:  30 cm/sec IVC: Present and patent with normal respiratory phasicity. Hepatic Artery Velocity:  153 cm/sec Splenic Vein Velocity:  16 cm/sec Spleen: 9.8 cm x 12.7 cm x 4.3 cm with a total volume of 326 cm^3 (411 cm^3 is upper limit normal) Portal Vein Occlusion/Thrombus: No Splenic Vein Occlusion/Thrombus: No Ascites: None Varices: None Gallbladder surgically absent. Common bile duct was obscured  by shadowing bowel gas. IMPRESSION: 1. Cirrhotic liver morphology. 2. Hepatofugal flow seen in portal vein consistent with portal hypertension. Electronically Signed   By: Miachel Roux M.D.   On: 07/27/2022 12:49   Rapid EEG  Result Date: 07/27/2022 Lora Havens, MD     07/27/2022 11:18 AM  Patient Name: Joann [name] MRN: [id number] Epilepsy Attending: Lora Havens Referring Physician/Provider: Dr Noemi Chapel Duration: 07/26/2022 2104 07/27/2022 0001  Patient history: 41yo F with ams. EEG to evaluate for seizure  Level of alertness:  comatose  AEDs during EEG study: Propofol  Technical aspects: This EEG was obtained using a 10 lead EEG system positioned circumferentially without any parasagittal coverage (rapid EEG). Computer selected EEG is reviewed as  well as background features and all clinically significant events. Description: EEG showed continuous generalized 2-3.5Hz  delta slowing.  Hyperventilation and photic stimulation were not performed.    ABNORMALITY - Continuous slow, generalized  IMPRESSION: This limited ceribell EEEGy is suggestive of severe diffuse encephalopathy, nonspecific etiology. No seizures or epileptiform discharges were seen throughout the recording.  Lora Havens  DG CHEST PORT 1 VIEW  Result Date: 07/27/2022 CLINICAL DATA:  41 year old female intubated. Respiratory failure. Encephalopathy. EXAM: PORTABLE CHEST 1 VIEW COMPARISON:  Portable chest 0425 hours today. FINDINGS: Portable AP supine view at 0714 hours. Stable endotracheal tube tip at the level the clavicles. Improved lung base ventilation with less veiling opacity bilaterally. No superimposed pneumothorax, pulmonary edema. Mediastinal contours are within normal limits. No definite air bronchograms. Paucity of bowel gas in the upper abdomen. No acute osseous abnormality identified. IMPRESSION: 1. Stable ET tube, tip at the level the clavicles. 2. Improved lung base ventilation since 0425 hours, regressed atelectasis and/or effusions. 3. No new cardiopulmonary abnormality. Electronically Signed   By: Genevie Ann M.D.   On: 07/27/2022 08:22   EEG adult  Result Date: 07/27/2022 Lora Havens, MD     07/27/2022 10:21 AM Patient Name: Joann [name] MRN: [id number] Epilepsy Attending: Lora Havens Referring  Physician/Provider: Dr Noemi Chapel Date: 07/27/2022 Duration: 32.31 mins Patient history: 41yo F with ams. EEG to evaluate for seizure Level of alertness:  comatose AEDs during EEG study: Propofol Technical aspects: This EEG study was done with scalp electrodes positioned according to the 10-20 International system of electrode placement. Electrical activity was reviewed with band pass filter of 1-70Hz , sensitivity of 7 uV/mm, display speed of 42m/sec with a 60Hz  notched filter applied as appropriate. EEG data were recorded continuously and digitally stored.  Video monitoring was available and reviewed as appropriate. Description: EEG showed continuous generalized 2-3.5Hz  delta slowing.  Hyperventilation and photic stimulation were not performed.   ABNORMALITY - Continuous slow, generalized IMPRESSION: This study is suggestive of severe diffuse encephalopathy, nonspecific etiology. No seizures or epileptiform discharges were seen throughout the recording. PLora Havens  DG Chest Port 1 View  Addendum Date: 07/27/2022   ADDENDUM REPORT: 07/27/2022 06:41 ADDENDUM: Discussed over phone with Dr. SCorinna Linesat 6:35 a.m., 07/27/2022, with verbal acknowledgement of key findings. Electronically Signed   By: KTelford NabM.D.   On: 07/27/2022 06:41   Result Date: 07/27/2022 CLINICAL DATA:  [id number] respiratory failure. EXAM: PORTABLE CHEST 1 VIEW COMPARISON:  Portable chest 07/25/2022 FINDINGS: 4:25 a.m. ETT now enters the orifice of the right main bronchus and needs to be withdrawn 3-4 cm to a mid tracheal positioning. The heart is enlarged. There is worsening perihilar vascular congestion, increasing central interstitial edema, and increasing  moderate pleural effusions with overlying opacities. The lung opacities could be due to atelectasis, airspace edema or pneumonia. The upper lung fields are clear of focal opacity. The mediastinum is stable. In all other respects no further changes. IMPRESSION: 1. ETT now  enters the orifice of the right main bronchus and needs to be withdrawn 3-4 cm to a mid tracheal positioning. 2. Worsening perihilar vascular congestion, central interstitial edema, and moderate pleural effusions with increased overlying opacities. 3. Radiologist assistant is attempting to reach the ordering physician or representative at the time of this dictation for stat notification. This report will be addended when notification has been made. Electronically Signed: By: Telford Nab M.D. On: 07/27/2022 06:32   CT HEAD WO CONTRAST (5MM)  Result Date: 07/26/2022 CLINICAL DATA:  Encephalopathy. EXAM: CT HEAD WITHOUT CONTRAST TECHNIQUE: Contiguous axial images were obtained from the base of the skull through the vertex without intravenous contrast. RADIATION DOSE REDUCTION: This exam was performed according to the departmental dose-optimization program which includes automated exposure control, adjustment of the mA and/or kV according to patient size and/or use of iterative reconstruction technique. COMPARISON:  Head CT 06/18/2022. FINDINGS: Brain: There is no evidence of acute intracranial hemorrhage, mass lesion, brain edema or extra-axial fluid collection. The ventricles and subarachnoid spaces are appropriately sized for age. There is no CT evidence of acute cortical infarction. Vascular:  No hyperdense vessel identified. Skull: Negative for fracture or focal lesion. Sinuses/Orbits: Mucosal thickening in the nasal passages. The paranasal sinuses, mastoid air cells and middle ears are clear. No significant orbital findings. Other: None. IMPRESSION: 1. No acute intracranial findings. 2. Mucosal thickening in the nasal passages. Electronically Signed   By: Richardean Sale M.D.   On: 07/26/2022 16:38   [place] EKG SITE RITE  Result Date: 07/26/2022 If Site Rite image not attached, placement could not be confirmed due to current cardiac rhythm.   Lab Results: Recent Labs    07/27/22 1710 07/28/22 0539  07/29/22 0914  WBC 7.1 8.6 6.7  HGB 8.1* 8.7* 8.7*  HCT 27.7* 29.3* 29.5*  PLT 31* 41* 56*   BMET Recent Labs    07/28/22 2115 07/29/22 0914 07/30/22 0522  NA 142 141 141  K 3.2* 2.7* 3.3*  CL 117* 112* 114*  CO2 22 21* 23  GLUCOSE 128* 152* 120*  BUN <5* <5* <5*  CREATININE 0.48 0.48 <0.30*  CALCIUM 7.2* 7.6* 7.5*   LFT Recent Labs    07/30/22 0522  PROT 7.3  ALBUMIN 2.4*  AST 76*  ALT 10  ALKPHOS 134*  BILITOT 4.4*   PT/INR Recent Labs    07/29/22 0506 07/30/22 0522  LABPROT 23.7* 21.8*  INR 2.1* 1.9*     Scheduled inpatient medications:   sodium chloride   Intravenous Once   Chlorhexidine Gluconate Cloth  6 each Topical Daily   folic acid  1 mg Intravenous Daily   lactulose  300 mL Rectal Q4H   mouth rinse  15 mL Mouth Rinse Q2H   pantoprazole  40 mg Intravenous Q12H   Continuous inpatient infusions:   sodium chloride Stopped (07/29/22 0708)   dextrose 50 mL/hr at 07/30/22 0800   potassium chloride 10 mEq (07/30/22 0915)   thiamine (VITAMIN B1) injection Stopped (07/29/22 1026)   PRN inpatient medications: sodium chloride, fentaNYL (SUBLIMAZE) injection, LORazepam, mouth rinse  Vital signs in last 24 hours: Temp:  [97.9 F (36.6 C)-99.5 F (37.5 C)] 99 F (37.2 C) (11/20 0800) Pulse Rate:  [93-124] 94 (11/20  0800) Resp:  [6-24] 18 (11/20 0800) BP: (112-163)/(63-101) 112/76 (11/20 0800) SpO2:  [94 %-98 %] 95 % (11/20 0837) FiO2 (%):  [30 %] 30 % (11/20 0837) Last BM Date : 07/30/22  Intake/Output Summary (Last 24 hours) at 07/30/2022 0916 Last data filed at 07/30/2022 0800 Gross per 24 hour  Intake 5225.34 ml  Output 5145 ml  Net 80.34 ml    Intake/Output from previous day: 11/19 0701 - 11/20 0700 In: 6640.2 [I.V.:1874.3; IV Piggyback:665.9] Out: 8916 [Urine:1300; Stool:4890] Intake/Output this shift: Total I/O In: 73.2 [I.V.:73.2] Out: -    Physical Exam:  General: minimally responsive female, intubated on ventilator Heart:   Regular rate and rhythm. No lower extremity edema Abdomen: Soft, nondistended. A few bowel sounds.    Principal Problem:   GI bleed Active Problems:   Encephalopathy, hepatic (HCC)   Alcoholic cirrhosis (HCC)   Alcohol withdrawal syndrome with complication (HCC)   Acute upper GI bleed   Bleeding esophageal varices (HCC)   AVM (arteriovenous malformation) of small bowel, acquired with hemorrhage   Esophageal varices in cirrhosis ([place])   Hematemesis     LOS: 5 days   Tye Savoy ,NP 07/30/2022, 9:16 AM

## 2022-07-30 NOTE — Progress Notes (Addendum)
PHARMACY - TOTAL PARENTERAL NUTRITION CONSULT NOTE   Indication: prolonged NPO, inability to place feeding tube  Patient Measurements: Height: 5' 4"  (162.6 cm) Weight: 74.6 kg (164 lb 7.4 oz) IBW/kg (Calculated) : 54.7 TPN AdjBW (KG): 63.5 Body mass index is 28.23 kg/m. Usual Weight: 75 kg (current weight)  Assessment:  39 yoF with PMH of alcohol abuse, cirrhosis, recent upper GI bleed, DTs w/ seizures, hepatic encephalopathy, HTN, and anxiety who presents with vomiting blood x1 day and several days of melena. Underwent recent banding x 6 for esophageal variceal bleeding, and need to avoid placing feeding tube that might cause esophageal or gastric trauma. Pharmacy consulted to dose TPN. Chronically malnourished d/t EtOH abuse; high risk for refeeding. Note consistently elevated ammonia.  Glucose: currently on D10 at 50 ml/hr for NPO and inability to place feeding tube - CBGs stable WNL on D10 - no Hx DM Electrolytes: Na WNL; K, Mg, Phos all low; bicarb, Ca borderline low; Cl elevated - Has been receiving >/= 40 mEq potassium daily with minimal improvement Renal: SCr now low, likely d/t immobility; at baseline on admission; BUN remains low; UOP adequate Hepatic: decompensated cirrhosis - LFTs minimally elevated - Tbili elevated but now improving - albumin consistently low likely d/t malnutrition - TG elevated on admission but WNL on 11/19 - Ammonia elevated on admission; remains > 100 despite aggressive lactulose regimen I/O: large stool OP d/t lactulose, but overall net + 6.5L this admission (assuming accurate charting) - MIVF: D10 @ 50 ml/hr  GI Imaging: - AXR 11/15 unremarkable GI Surgeries / Procedures: n/a  Central access: CVC TPN start date: 11/20  RD Assessment: Estimated Needs Total Energy Estimated Needs: 1900-2250 kcal Total Protein Estimated Needs: 85-110 grams Total Fluid Estimated Needs: >/= 1.9L  Nutritional Goals: Current TPN formula at 30 mL/hr provides <40  g of protein and ~700 kcals per day, in keeping with RD recommendations to initially limit protein given elevated ammonia, and limit kcal d/t high refeeding risk  Current Nutrition:  NPO, D10 at 50 ml/hr  Plan:  KCl 10 mEq IV x 6 per MD Mg 4g IV x 1 KPhos 15 mmol IV x 1   Start TPN at 30 mL/hr at 1800 Electrolytes in TPN: no changes (replacing lytes outside TPN until more stable) Na - 50 mEq/L K - 50 mEq/L Ca - 5 mEq/L Mg - 5 mEq/L Phos - 15 mmol/L Cl:Ac ratio - 1:1 Add standard MVI and trace elements to TPN Continue q4h CBG checks; add sensitive SSI and adjust as needed  Stop D10 at 1800 Monitor TPN labs on Mon/Thurs Full TPN panel tomorrow  Reuel Boom, PharmD, BCPS [phone] 07/30/2022, 12:13 PM

## 2022-07-30 NOTE — Progress Notes (Signed)
Discussed need for PICC with Allison,RN: she reports PICC is not urgent but would like to remove femoral line. Message to PICC team.

## 2022-07-31 ENCOUNTER — Telehealth: Payer: Self-pay

## 2022-07-31 ENCOUNTER — Inpatient Hospital Stay (HOSPITAL_COMMUNITY): Payer: Medicaid Other

## 2022-07-31 ENCOUNTER — Ambulatory Visit: Payer: Medicaid Other | Admitting: Physician Assistant

## 2022-07-31 DIAGNOSIS — K922 Gastrointestinal hemorrhage, unspecified: Secondary | ICD-10-CM | POA: Diagnosis not present

## 2022-07-31 DIAGNOSIS — J9601 Acute respiratory failure with hypoxia: Secondary | ICD-10-CM | POA: Diagnosis not present

## 2022-07-31 DIAGNOSIS — K7030 Alcoholic cirrhosis of liver without ascites: Secondary | ICD-10-CM | POA: Diagnosis not present

## 2022-07-31 DIAGNOSIS — I8511 Secondary esophageal varices with bleeding: Secondary | ICD-10-CM | POA: Diagnosis not present

## 2022-07-31 DIAGNOSIS — F10939 Alcohol use, unspecified with withdrawal, unspecified: Secondary | ICD-10-CM | POA: Diagnosis not present

## 2022-07-31 DIAGNOSIS — K7682 Hepatic encephalopathy: Secondary | ICD-10-CM | POA: Diagnosis not present

## 2022-07-31 LAB — CBC
HCT: 28 % — ABNORMAL LOW (ref 36.0–46.0)
Hemoglobin: 8.4 g/dL — ABNORMAL LOW (ref 12.0–15.0)
MCH: 28.3 pg (ref 26.0–34.0)
MCHC: 30 g/dL (ref 30.0–36.0)
MCV: 94.3 fL (ref 80.0–100.0)
Platelets: 63 10*3/uL — ABNORMAL LOW (ref 150–400)
RBC: 2.97 MIL/uL — ABNORMAL LOW (ref 3.87–5.11)
RDW: 23.4 % — ABNORMAL HIGH (ref 11.5–15.5)
WBC: 3.5 10*3/uL — ABNORMAL LOW (ref 4.0–10.5)
nRBC: 0 % (ref 0.0–0.2)

## 2022-07-31 LAB — COMPREHENSIVE METABOLIC PANEL WITH GFR
ALT: 9 U/L (ref 0–44)
AST: 51 U/L — ABNORMAL HIGH (ref 15–41)
Albumin: 2.3 g/dL — ABNORMAL LOW (ref 3.5–5.0)
Alkaline Phosphatase: 123 U/L (ref 38–126)
Anion gap: 6 (ref 5–15)
BUN: <5 mg/dL — ABNORMAL LOW (ref 6–20)
CO2: 23 mmol/L (ref 22–32)
Calcium: 7.8 mg/dL — ABNORMAL LOW (ref 8.9–10.3)
Chloride: 112 mmol/L — ABNORMAL HIGH (ref 98–111)
Creatinine, Ser: 0.41 mg/dL — ABNORMAL LOW (ref 0.44–1.00)
GFR, Estimated: >60 mL/min (ref >60)
Glucose, Bld: 130 mg/dL — ABNORMAL HIGH (ref 70–99)
Potassium: 2.9 mmol/L — ABNORMAL LOW (ref 3.5–5.1)
Sodium: 141 mmol/L (ref 135–145)
Total Bilirubin: 3.7 mg/dL — ABNORMAL HIGH (ref 0.3–1.2)
Total Protein: 7.1 g/dL (ref 6.5–8.1)

## 2022-07-31 LAB — GLUCOSE, CAPILLARY
Glucose-Capillary: 106 mg/dL — ABNORMAL HIGH (ref 70–99)
Glucose-Capillary: 109 mg/dL — ABNORMAL HIGH (ref 70–99)
Glucose-Capillary: 116 mg/dL — ABNORMAL HIGH (ref 70–99)
Glucose-Capillary: 117 mg/dL — ABNORMAL HIGH (ref 70–99)
Glucose-Capillary: 111 mg/dL — ABNORMAL HIGH (ref 70–99)
Glucose-Capillary: 101 mg/dL — ABNORMAL HIGH (ref 70–99)

## 2022-07-31 LAB — TRIGLYCERIDES: Triglycerides: 88 mg/dL (ref <150)

## 2022-07-31 LAB — MAGNESIUM
Magnesium: 1.8 mg/dL (ref 1.7–2.4)
Magnesium: 1.7 mg/dL (ref 1.7–2.4)

## 2022-07-31 LAB — AMMONIA: Ammonia: 147 umol/L — ABNORMAL HIGH (ref 9–35)

## 2022-07-31 LAB — POTASSIUM: Potassium: 2.9 mmol/L — ABNORMAL LOW (ref 3.5–5.1)

## 2022-07-31 LAB — PHOSPHORUS
Phosphorus: 1.9 mg/dL — ABNORMAL LOW (ref 2.5–4.6)
Phosphorus: 2.8 mg/dL (ref 2.5–4.6)

## 2022-07-31 MED ORDER — POTASSIUM CHLORIDE 20 MEQ PO PACK
40.0000 meq | PACK | Freq: Two times a day (BID) | ORAL | Status: DC
  Administered 2022-07-31 – 2022-08-04 (×9): 40 meq
  Filled 2022-07-31 (×9): qty 2

## 2022-07-31 MED ORDER — MAGNESIUM SULFATE 2 GM/50ML IV SOLN
2.0000 g | Freq: Once | INTRAVENOUS | Status: AC
  Administered 2022-07-31: 2 g via INTRAVENOUS
  Filled 2022-07-31: qty 50

## 2022-07-31 MED ORDER — POTASSIUM CHLORIDE 20 MEQ PO PACK: 40.0000 meq | PACK | Freq: Two times a day (BID) | ORAL | Status: DC

## 2022-07-31 MED ORDER — POTASSIUM PHOSPHATES 15 MMOLE/5ML IV SOLN
30.0000 mmol | Freq: Once | INTRAVENOUS | Status: AC
  Administered 2022-07-31: 30 mmol via INTRAVENOUS
  Filled 2022-07-31: qty 10

## 2022-07-31 NOTE — Progress Notes (Signed)
Daily Progress Note  Hospital Day: 7  Chief Complaint: decompensated cirrhosis, variceal bleed, encephalopathy   Brief History [name] with a pmh not limited to Etoh abuse, DTs with seizures, Etoh cirrhosis, IDA.  Seen in consultation by [place] on 11/15 for hematemesis    Assessment:  # 41 yo female with decompensated Etoh cirrhosis.  Admitted with hematemesis s/p EGD with banding of Grade II and grade III esophageal varices with stigmata of recent bleeding    So far no evidence for recurrent GI bleeding.  Hgb stable to 8.7.    Today's labs reviewed. T bili has been elevated in setting of hepatic decompensation / recent etoh use  but it is slowly declining at 3.7 today.  AST 51 / ALT 9. Ammonia in 140's   INR still elevated at 1.9 yesterday despite multiple dose of Vit K but will hold off on additional Vit K since   [place] negative for liver lesions or ascites.    Persistent encephalopathy - Head CT scan on 11/16 without acute findings. EEG - nonspecific encephalopathy.  Had been getting lactulose enemas for last few days. Small bowel feeding tube placed yesterday,  lactulose per tube started. Per staff reports she was starting to become responsive. Currently not responsive as she just got Fentanyl for replacement of Dobhoff which dislodged  Nutrition - dobhoff placed yesterday. Tube dislodged so hasn't really started feeds yet. Tube just replaced a few minutes ago.     Plan:   Resume enteral feeding.  Continue lactulose 30 grams TID via dobhoff.  Monitor for any signs of variceal bleeding with dobhoff in place.  Plan is for repeat EGD with banding in ~ 3 weeks. Hopefully she will comply. If not then will likely be released from [place].   Endoscopic studies:  07/25/22 EGD -No gross lesions in the proximal esophagus and in the mid esophagus. - Grade II and grade III esophageal varices with stigmata of recent bleeding found in the distal esophagus. Completely eradicated.  Banded. - Z-line irregular, 39 cm from the incisors. - 1 cm hiatal hernia. - Portal hypertensive gastropathy throughout. 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 Treated with argon plasma coagulation (APC). Clip (MR conditional) was placed. Clip manufacturer: [employer]. - No gross lesions in the duodenal bulb, in the first portion of the duodenum and in the second portion of the duodenum.  Imaging:  DG Abd Portable 1V  Result Date: 07/31/2022 CLINICAL DATA:  Feeding tube EXAM: PORTABLE ABDOMEN - 1 VIEW COMPARISON:  07/30/22 CXR FINDINGS: Interval retraction of removal of the enteric tube. Surgical clips in the right upper quadrant. Right groin vascular catheter in place. Bladder temperature probe versus bladder catheter in place. Degenerative changes in the lower lumbar spine. No dilated loops of small bowel are visualized. Normal caliber loops of large bowel. No pneumatosis. No supine evidence of pneumoperitoneum left lung base is without acute abnormality. IMPRESSION: Interval retraction or removal of enteric tube; correlate with history. If the tube has been repositioned further evaluation with a chest radiograph is recommended. Electronically Signed   By: Marin Roberts M.D.   On: 07/31/2022 08:40   DG Abd 1 View  Result Date: 07/30/2022 CLINICAL DATA:  Nasogastric tube placement, orogastric tube adjustment. EXAM: ABDOMEN - 1 VIEW COMPARISON:  Radiograph earlier today. FINDINGS: The enteric tube is again noted to be kinked in the stomach, the tip is directed cranially towards the gastroesophageal junction. Tube has been retracted since  prior exam. Nonobstructive upper abdominal bowel gas pattern. IMPRESSION: Enteric tube is kinked in the stomach, with the tip directed cranially towards the gastroesophageal junction. Tube has been retracted since prior exam. Electronically Signed   By: Keith Rake M.D.   On: 07/30/2022 19:48   DG Abd 1 View  Result Date:  07/30/2022 CLINICAL DATA:  Nodes 0 gastric tube placement EXAM: ABDOMEN - 1 VIEW COMPARISON:  07/30/2022 at 4 p.m. FINDINGS: NG tube extends the stomach. The NG tube appears folded back upon itself however this could be projectional. IMPRESSION: 1. No significant change in NG tube position in the stomach. 2. If tube flushing and drain appropriately, no intervention necessary. If resistance, consider retraction by 5 to 8 cm to straighten NG tube Electronically Signed   By: Suzy Bouchard M.D.   On: 07/30/2022 17:11   DG Abd 1 View  Result Date: 07/30/2022 CLINICAL DATA:  [id number] Encounter for nasogastric (NG) tube placement [id number] EXAM: ABDOMEN - 1 VIEW COMPARISON:  X-ray abdomen 07/25/2022 FINDINGS: Upper abdomen collimated off view. Enteric tube with tip and side port overlying the expected region the gastric lumen. The bowel gas pattern is normal. Endoluminal clip overlies the mid abdomen at the level of the L2 level. No radio-opaque calculi or other significant radiographic abnormality are seen. IMPRESSION: 1. Enteric tube in good position.  Could be retracted by 8 cm. 2. Nonobstructive bowel gas pattern. Electronically Signed   By: Iven Finn M.D.   On: 07/30/2022 16:26   [place] EKG SITE RITE  Result Date: 07/30/2022 If Site Rite image not attached, placement could not be confirmed due to current cardiac rhythm.  US ABDOMEN LIMITED WITH LIVER DOPPLER  Result Date: 07/27/2022 CLINICAL DATA:  Abnormal liver function tests EXAM: DUPLEX ULTRASOUND OF LIVER TECHNIQUE: Color and duplex Doppler ultrasound was performed to evaluate the hepatic in-flow and out-flow vessels. COMPARISON:  CT abdomen pelvis 02/24/2022 FINDINGS: Liver: Diffuse heterogeneity of hepatic parenchymal echogenicity. Nodular hepatic contours consistent with cirrhosis. 0.8 Main Portal Vein size: 18 cm Portal Vein Velocities Main Prox:  18 cm/sec Main Mid: 28 cm/sec Main Dist:  31 cm/sec Right: 20 cm/sec Left: 24 cm/sec Hepatic Vein  Velocities Right:  27 cm/sec Middle:  28 cm/sec Left:  30 cm/sec IVC: Present and patent with normal respiratory phasicity. Hepatic Artery Velocity:  153 cm/sec Splenic Vein Velocity:  16 cm/sec Spleen: 9.8 cm x 12.7 cm x 4.3 cm with a total volume of 326 cm^3 (411 cm^3 is upper limit normal) Portal Vein Occlusion/Thrombus: No Splenic Vein Occlusion/Thrombus: No Ascites: None Varices: None Gallbladder surgically absent. Common bile duct was obscured by shadowing bowel gas. IMPRESSION: 1. Cirrhotic liver morphology. 2. Hepatofugal flow seen in portal vein consistent with portal hypertension. Electronically Signed   By: Miachel Roux M.D.   On: 07/27/2022 12:49    Lab Results: Recent Labs    07/29/22 0914 07/30/22 0522 07/31/22 0442  WBC 6.7 5.2 3.5*  HGB 8.7* 8.7* 8.4*  HCT 29.5* 29.7* 28.0*  PLT 56* 57* 63*   BMET Recent Labs    07/29/22 0914 07/30/22 0522 07/31/22 0442  NA 141 141 141  K 2.7* 3.3* 2.9*  CL 112* 114* 112*  CO2 21* 23 23  GLUCOSE 152* 120* 130*  BUN <5* <5* <5*  CREATININE 0.48 <0.30* 0.41*  CALCIUM 7.6* 7.5* 7.8*   LFT Recent Labs    07/31/22 0442  PROT 7.1  ALBUMIN 2.3*  AST 51*  ALT 9  ALKPHOS 123  BILITOT 3.7*   PT/INR Recent Labs    07/29/22 0506 07/30/22 0522  LABPROT 23.7* 21.8*  INR 2.1* 1.9*     Scheduled inpatient medications:   Chlorhexidine Gluconate Cloth  6 each Topical Daily   feeding supplement (PROSource TF20)  60 mL Per Tube Daily   folic acid  1 mg Intravenous Daily   insulin aspart  0-9 Units Subcutaneous Q4H   lactulose  30 g Per Tube TID   mouth rinse  15 mL Mouth Rinse Q2H   pantoprazole  40 mg Intravenous Q12H   rifaximin  550 mg Per Tube BID   Continuous inpatient infusions:   sodium chloride Stopped (07/29/22 0708)   dextrose 50 mL/hr at 07/31/22 0930   feeding supplement (VITAL 1.5 CAL) Stopped (07/30/22 2300)   potassium PHOSPHATE IVPB (in mmol) 85 mL/hr at 07/31/22 0930   thiamine (VITAMIN B1) injection  Stopped (07/31/22 1030)   PRN inpatient medications: sodium chloride, fentaNYL (SUBLIMAZE) injection, LORazepam, mouth rinse  Vital signs in last 24 hours: Temp:  [98.6 F (37 C)-99.5 F (37.5 C)] 98.6 F (37 C) (11/21 1000) Pulse Rate:  [81-121] 94 (11/21 1000) Resp:  [10-21] 10 (11/21 1000) BP: (88-152)/(48-93) 146/89 (11/21 1000) SpO2:  [93 %-100 %] 100 % (11/21 1000) FiO2 (%):  [30 %] 30 % (11/21 0805) Weight:  [73.7 kg-74.6 kg] 73.7 kg (11/21 0509) Last BM Date : 07/30/22  Intake/Output Summary (Last 24 hours) at 07/31/2022 1121 Last data filed at 07/31/2022 0930 Gross per 24 hour  Intake 2056.33 ml  Output 3750 ml  Net -1693.67 ml    Intake/Output from previous day: 11/20 0701 - 11/21 0700 In: 2917.2 [I.V.:1195.2; NG/GT:242.2; IV Piggyback:779.8] Out: 3903 [Urine:2050; Stool:3100] Intake/Output this shift: Total I/O In: 221.7 [I.V.:149.6; IV Piggyback:72.1] Out: -    Physical Exam:  General: sedated on ventilator Heart:  Regular rate and rhythm. No lower extremity edema Abdomen: Soft, nondistended, normal bowel sounds.  Neurologic: sedated Psych: Pleasant. Cooperative.   Principal Problem:   GI bleed Active Problems:   Encephalopathy, hepatic (HCC)   Alcoholic cirrhosis (HCC)   Alcohol withdrawal syndrome with complication (HCC)   Acute upper GI bleed   Bleeding esophageal varices (HCC)   AVM (arteriovenous malformation) of small bowel, acquired with hemorrhage   Esophageal varices in cirrhosis ([place])   Hematemesis     LOS: 6 days   Tye Savoy ,NP 07/31/2022, 11:21 AM

## 2022-07-31 NOTE — Progress Notes (Signed)
NAME:  Joann [name], MRN:  [id number], DOB:  08-Feb-1981, LOS: 6 ADMISSION DATE:  07/25/2022, CONSULTATION DATE:  07/25/2022 REFERRING MD:  Dr. Ralene Bathe, CHIEF COMPLAINT:  hematemesis    History of Present Illness:  41 year old female with PMH significant for current ETOH abuse, ETOH cirrhosis, DTs w/seizures, hepatic encephalopathy, HTN, and anxiety presenting to [facility] ER with complaints of nausea with multiple episodes of hematemesis since Tuesday morning with RUQ abdominal, weakness, unable to eat in three days, and several days of dark stools. Drinks a gallon of vodka daily, last drink was 11/14.  Hx of seizures when she tries to quit.  Multiple episodes of small dark bloody emesis in ER.  Hgb 9.7, INR 1.6, plts 76.   Admitted to PCCM.  Given FFP, PRBC, and txa.  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    Pertinent  Medical History  ETOH abuse, ETOH cirrhosis with grade 1 esophageal varices, portal HTN gastropathy, DTs w/seizures, hepatic encephalopathy, HTN, anxiety  Previously f/b [place] [facility] Events: Including procedures, antibiotic start and stop dates in addition to other pertinent events   11/15 Admitted hematemesis, intubated/ EGD> esophageal varices s/p 6 bands, multiple blood products/ txa/ vit k 11/16 very hard to sedate then progressively hypotensive despite decreasing sedation> CTH neg, EEG/ cerebell neg, multiple blood products for coagulapathy/ txa/ vit k, tongue biting/ clenching 11/21 Has been minimally alert until today with intermittent following of commands per overnight RN  Interim History / Subjective:  Does not follow commands from me but agitated Per overnight RN she will intermittently hold hand Lost NGT yesterday however able to replace this morning  Objective   Blood pressure 108/74, pulse 98, temperature 98.6 F (37 C),  resp. rate 14, height 5' 4"  (1.626 m), weight 73.7 kg, SpO2 97 %.    Vent Mode: PSV;CPAP FiO2 (%):  [30 %] 30 % Set Rate:  [12 bmp] 12 bmp Vt Set:  [430 mL] 430 mL PEEP:  [5 cmH20] 5 cmH20 Pressure Support:  [10 cmH20-15 cmH20] 10 cmH20 Plateau Pressure:  [11 cmH20-20 cmH20] 20 cmH20   Intake/Output Summary (Last 24 hours) at 07/31/2022 1223 Last data filed at 07/31/2022 0930 Gross per 24 hour  Intake 1910.53 ml  Output 3750 ml  Net -1839.47 ml   Filed Weights   07/29/22 0500 07/30/22 1144 07/31/22 0509  Weight: 76 kg 74.6 kg 73.7 kg   Physical Exam: General: Well-appearing, no acute distress HENT: [place], AT, ETT in place, tongue with abrasion/bleed Eyes: EOMI, no scleral icterus Respiratory: Clear to auscultation bilaterally.  No crackles, wheezing or rales Cardiovascular: RRR, -M/R/G, no JVD GI: BS+, soft, nontender Extremities:-Edema,-tenderness Neuro: Does not open eyes. Withdraws and moves extremities spontaneously. +cough/gag. Does not follow commands for me GU: Foley in place  K 2.9 Phos 1.9 AST improving from 76>51 Ammonia worsening 119>147 T bili improving 3.7  Resolved Hospital Problem list   Hemorrhagic shock +/- septic shock. Sepsis ruled out Stap epi contaminant in blood culture  Assessment & Plan:   Acute metabolic encephalopathy - slowly improving Hepatic encephalopathy Hyperammonemia Hypoactive delirium CT head NAICA. EEG 11/17 neg - Minimized sedating medications - Restart lactulose TID and rifaximin  Upper GIB 2/2 bleeding grade III esophageal varices s/p banding, portal hypertensive gastropathy and duodenum lesion s/p APC and clip ETOH cirrhosis ETOH abuse, ongoing, 1 gallon of vodka daily, last  drink 11/14 Portal hypertensive gastropathy  Hepatic encephalopathy  - discriminant score 26.3, steroids deferred at admission - EGD 11/15 by Dr. Rush Landmark Grade II and III esophageal varices w/ signs of recent bleeding s/p 6 bands, portal  hypertensive gastropathy, single bleeding angiodysplastic lesion in duodenum s/p APC and clip  (prior EGD 06/26/22 showed one single column of grade I esophageal varices  - Completed SBP ppx Plan:  - trend CBC - IV PPI BID - recs for repeat EGD in 3-4 weeks - continue lactulose and rifaximin  Acute hypoxic respiratory failure Intubated for EGD 11/15 and left on MV overnight to monitor for rebleeding of banded varices  - SBT/WUA daily. Extubation precluded by mental status - Full vent support. VAP bundle - wean FiO2 as able for SpO2 >92%   Acute on chronic Anemia  Coagulapathy - acute on chronic thrombocytopenia, elevated INR - 2 units FFP now with TXA f/b 2 units PRBC 11/15 - 1 FFP, 1 platelets, 1 cryo, txa, vit K 11/16 - 1 FFP 11/17 Thrombocytopenia slightly improving to 63 P:  - trend cbc  Hypokalemia Hypocalcemia  Hypophos  Hypomagnesia  - correct electrolytes - Repleted K and Phos - F/u PM K and Phos  Elevated bilirubin - improved Transaminitis - improved Unclear if cholestasis of sepsis, alc hep. Doppler without evidence of clot.  - trend  Hypoglycemia Likely related to liver dysfunction, critically illness, and NPO - cont D10 gtt, titrate for CBG ~120 - CBGs q4hr - Wean once TF started  PAF HTN - hold home coreg   - had brief episode of afib 11/15.  Remains in [place] contraindicated, and likely just related to critical illness  - remains NSR/ ST on telemetry.  Continue to monitor  - keep K> 4, Mag > 2  Urinary retention - foley placed 11/16  - strict I/Os  Moderate Malnutrition related to chronic illness - appreciate RD consult - NPO, unable to place OGT/ dobhoff given recent esophageal banding of varices - consider starting TPN if unable to liberate from MV soon, ?Monday   High risk for ETOH withdrawal, hx of Dts ETOH abuse - daily 1 gallon of vodka, last drink 11/14 - stopped phenobarb taper 11/18 given poor responsiveness off continuous sedation  all day - high dose thiamine x 2 days then 140m daily (started 11/16) - cont thiamine, folate IV - prn ativan q 2 - seizure precautions - will need TOC closer to discharge for options/ resources> ?inpt rehab facility  Best Practice (right click and "Reselect all SmartList Selections" daily)   Diet/type: tubefeeds. D10 gtt DVT prophylaxis: SCD GI prophylaxis: PPI Lines: Central line Foley:  Yes, and it is still needed Code Status:  full code Last date of multidisciplinary goals of care discussion:  Husband, TCelesta [phone](truck driver)> [place]  Advises if he can not answer phone to leave a message and he would call back.   [place] updated by phone 11/16.  Mother updated 11/17  Critical care time: 35 mins    The patient is critically ill with multiple organ systems failure and requires high complexity decision making for assessment and support, frequent evaluation and titration of therapies, application of advanced monitoring technologies and extensive interpretation of multiple databases.    JRodman Pickle M.D. LMid[facility] LLCPulmonary/Critical Care Medicine 07/31/2022 12:23 PM   Please see Amion for pager number to reach on-call Pulmonary and Critical Care Team.

## 2022-08-01 DIAGNOSIS — K7030 Alcoholic cirrhosis of liver without ascites: Secondary | ICD-10-CM | POA: Diagnosis not present

## 2022-08-01 DIAGNOSIS — I8511 Secondary esophageal varices with bleeding: Secondary | ICD-10-CM | POA: Diagnosis not present

## 2022-08-01 DIAGNOSIS — K922 Gastrointestinal hemorrhage, unspecified: Secondary | ICD-10-CM | POA: Diagnosis not present

## 2022-08-01 DIAGNOSIS — K7682 Hepatic encephalopathy: Secondary | ICD-10-CM | POA: Diagnosis not present

## 2022-08-01 DIAGNOSIS — F10939 Alcohol use, unspecified with withdrawal, unspecified: Secondary | ICD-10-CM | POA: Diagnosis not present

## 2022-08-01 DIAGNOSIS — G9341 Metabolic encephalopathy: Secondary | ICD-10-CM | POA: Diagnosis not present

## 2022-08-01 DIAGNOSIS — J9601 Acute respiratory failure with hypoxia: Secondary | ICD-10-CM | POA: Diagnosis not present

## 2022-08-01 LAB — BASIC METABOLIC PANEL
Anion gap: 4 — ABNORMAL LOW (ref 5–15)
BUN: <5 mg/dL — ABNORMAL LOW (ref 6–20)
CO2: 22 mmol/L (ref 22–32)
Calcium: 7.8 mg/dL — ABNORMAL LOW (ref 8.9–10.3)
Chloride: 113 mmol/L — ABNORMAL HIGH (ref 98–111)
Creatinine, Ser: 0.41 mg/dL — ABNORMAL LOW (ref 0.44–1.00)
GFR, Estimated: >60 mL/min (ref >60)
Glucose, Bld: 149 mg/dL — ABNORMAL HIGH (ref 70–99)
Potassium: 2.7 mmol/L — CL (ref 3.5–5.1)
Sodium: 139 mmol/L (ref 135–145)

## 2022-08-01 LAB — PHOSPHORUS
Phosphorus: 2.1 mg/dL — ABNORMAL LOW (ref 2.5–4.6)
Phosphorus: 2.6 mg/dL (ref 2.5–4.6)

## 2022-08-01 LAB — GLUCOSE, CAPILLARY
Glucose-Capillary: 110 mg/dL — ABNORMAL HIGH (ref 70–99)
Glucose-Capillary: 111 mg/dL — ABNORMAL HIGH (ref 70–99)
Glucose-Capillary: 94 mg/dL (ref 70–99)
Glucose-Capillary: 106 mg/dL — ABNORMAL HIGH (ref 70–99)
Glucose-Capillary: 92 mg/dL (ref 70–99)
Glucose-Capillary: 104 mg/dL — ABNORMAL HIGH (ref 70–99)

## 2022-08-01 LAB — MAGNESIUM
Magnesium: 1.7 mg/dL (ref 1.7–2.4)
Magnesium: 1.8 mg/dL (ref 1.7–2.4)

## 2022-08-01 MED ORDER — MAGNESIUM SULFATE 2 GM/50ML IV SOLN
2.0000 g | Freq: Once | INTRAVENOUS | Status: AC
  Administered 2022-08-01: 2 g via INTRAVENOUS
  Filled 2022-08-01: qty 50

## 2022-08-01 MED ORDER — POTASSIUM CHLORIDE 20 MEQ PO PACK
40.0000 meq | PACK | Freq: Once | ORAL | Status: AC
  Administered 2022-08-01: 40 meq
  Filled 2022-08-01: qty 2

## 2022-08-01 MED ORDER — POTASSIUM PHOSPHATES 15 MMOLE/5ML IV SOLN
15.0000 mmol | Freq: Once | INTRAVENOUS | Status: AC
  Administered 2022-08-01: 15 mmol via INTRAVENOUS
  Filled 2022-08-01: qty 5

## 2022-08-01 MED ORDER — POTASSIUM CHLORIDE 10 MEQ/50ML IV SOLN
10.0000 meq | INTRAVENOUS | Status: AC
  Administered 2022-08-01 (×2): 10 meq via INTRAVENOUS
  Filled 2022-08-01 (×2): qty 50

## 2022-08-01 MED ORDER — FREE WATER
30.0000 mL | Freq: Four times a day (QID) | Status: DC
  Administered 2022-08-01 – 2022-08-04 (×12): 30 mL

## 2022-08-01 NOTE — Progress Notes (Signed)
NAME:  Joann [name], MRN:  [id number], DOB:  01-30-1981, LOS: 7 ADMISSION DATE:  07/25/2022, CONSULTATION DATE:  07/25/2022 REFERRING MD:  Dr. Ralene Bathe, CHIEF COMPLAINT:  hematemesis    History of Present Illness:  41 year old female with PMH significant for current ETOH abuse, ETOH cirrhosis, DTs w/seizures, hepatic encephalopathy, HTN, and anxiety presenting to [facility] ER with complaints of nausea with multiple episodes of hematemesis since Tuesday morning with RUQ abdominal, weakness, unable to eat in three days, and several days of dark stools. Drinks a gallon of vodka daily, last drink was 11/14.  Hx of seizures when she tries to quit.  Multiple episodes of small dark bloody emesis in ER.  Hgb 9.7, INR 1.6, plts 76.   Admitted to PCCM.  Given FFP, PRBC, and txa.  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    Pertinent  Medical History  ETOH abuse, ETOH cirrhosis with grade 1 esophageal varices, portal HTN gastropathy, DTs w/seizures, hepatic encephalopathy, HTN, anxiety  Previously f/b [place] Hospital Events: Including procedures, antibiotic start and stop dates in addition to other pertinent events   11/15 Admitted hematemesis, intubated/ EGD> esophageal varices s/p 6 bands, multiple blood products/ txa/ vit k 11/16 very hard to sedate then progressively hypotensive despite decreasing sedation> CTH neg, EEG/ cerebell neg, multiple blood products for coagulapathy/ txa/ vit k, tongue biting/ clenching 11/21 Has been minimally alert until today with intermittent following of commands per overnight RN  Interim History / Subjective:  Agitated this morning and gagging on ETT. Required fentanyl bolus  Objective   Blood pressure (!) 104/58, pulse 86, temperature 98.8 F (37.1 C), resp. rate 12, height 5' 4"  (1.626 m), weight 73.3 kg, SpO2 98 %.    Vent Mode:  PRVC;PSV FiO2 (%):  [30 %] 30 % Set Rate:  [12 bmp] 12 bmp Vt Set:  [430 mL] 430 mL PEEP:  [5 cmH20] 5 cmH20 Pressure Support:  [10 cmH20] 10 cmH20 Plateau Pressure:  [5 cmH20-12 cmH20] 5 cmH20   Intake/Output Summary (Last 24 hours) at 08/01/2022 0835 Last data filed at 08/01/2022 0724 Gross per 24 hour  Intake 1906.25 ml  Output 2845 ml  Net -938.75 ml   Filed Weights   07/30/22 1144 07/31/22 0509 08/01/22 0500  Weight: 74.6 kg 73.7 kg 73.3 kg   Physical Exam: General: Chronically ill-appearing, sedated HENT: [place], AT, ETT in place Eyes: EOMI, no scleral icterus Respiratory: Clear to auscultation bilaterally.  No crackles, wheezing or rales Cardiovascular: RRR, -M/R/G, no JVD GI: BS+, soft, nontender Extremities:-Edema,-tenderness Neuro: Sedated GU: Foley in place  K 2.7 Phos 2.1 Mg 1.7  AST improving from 76>51 Ammonia worsening 119>147 T bili improving 3.7  Resolved Hospital Problem list   Hemorrhagic shock +/- septic shock. Sepsis ruled out Stap epi contaminant in blood culture  Assessment & Plan:   Acute metabolic encephalopathy - slowly improving Hepatic encephalopathy Hyperammonemia Hypoactive delirium CT head NAICA. EEG 11/17 neg - Minimized sedating medications - Continue lactulose TID and rifaximin  Upper GIB 2/2 bleeding grade III esophageal varices s/p banding, portal hypertensive gastropathy and duodenum lesion s/p APC and clip ETOH cirrhosis ETOH abuse, ongoing, 1 gallon of vodka daily, last drink 11/14 Portal hypertensive gastropathy  Hepatic encephalopathy  - discriminant score 26.3, steroids deferred at admission - EGD 11/15 by Dr. Rush Landmark Grade II and III esophageal varices w/ signs  of recent bleeding s/p 6 bands, portal hypertensive gastropathy, single bleeding angiodysplastic lesion in duodenum s/p APC and clip  (prior EGD 06/26/22 showed one single column of grade I esophageal varices  - Completed SBP ppx Plan:  - trend CBC - IV  PPI BID - recs for repeat EGD in 3-4 weeks - continue lactulose and rifaximin  Acute hypoxic respiratory failure Intubated for EGD 11/15 and left on MV overnight to monitor for rebleeding of banded varices  - SBT/WUA daily. Extubation precluded by mental status. Day 7 on vent - Full vent support. VAP bundle - wean FiO2 as able for SpO2 >92%   Acute on chronic Anemia  Coagulapathy - acute on chronic thrombocytopenia, elevated INR - 2 units FFP now with TXA f/b 2 units PRBC 11/15 - 1 FFP, 1 platelets, 1 cryo, txa, vit K 11/16 - 1 FFP 11/17 Thrombocytopenia slightly improving to 63 P:  - trend cbc  Hypokalemia Hypocalcemia  Hypophos  Hypomagnesia  - correct electrolytes - Repleted K and Phos, Mg - F/u PM K and Phos  Elevated bilirubin - improved Transaminitis - improved Unclear if cholestasis of sepsis, alc hep. Doppler without evidence of clot.  - trend  Hypoglycemia Likely related to liver dysfunction, critically illness, and NPO - cont D10 gtt, titrate for CBG ~120 - CBGs q4hr - Wean once TF started  PAF HTN - hold home coreg   - had brief episode of afib 11/15.  Remains in [place] contraindicated, and likely just related to critical illness  - remains NSR/ ST on telemetry.  Continue to monitor  - keep K> 4, Mag > 2  Urinary retention - foley placed 11/16  - strict I/Os  Moderate Malnutrition related to chronic illness - appreciate RD consult - Monitor for refeeding syndrome  High risk for ETOH withdrawal, hx of Dts ETOH abuse - daily 1 gallon of vodka, last drink 11/14 - stopped phenobarb taper 11/18 given poor responsiveness off continuous sedation all day - high dose thiamine x 2 days then 176m daily (started 11/16) - cont thiamine, folate IV - prn ativan q 2 - seizure precautions - will need TOC closer to discharge for options/ resources> ?inpt rehab facility  Best Practice (right click and "Reselect all SmartList Selections" daily)   Diet/type:  tubefeeds. D10 gtt DVT prophylaxis: SCD GI prophylaxis: PPI Lines: Central line Foley:  Yes, and it is still needed Code Status:  full code Last date of multidisciplinary goals of care discussion:  Husband, Joann [phone](truck driver)> [place]  Advises if he can not answer phone to leave a message and he would call back.   Husband, Joann Gentileupdated by phone 11/22  Critical care time: 46 mins    The patient is critically ill with multiple organ systems failure and requires high complexity decision making for assessment and support, frequent evaluation and titration of therapies, application of advanced monitoring technologies and extensive interpretation of multiple databases.   JRodman Pickle M.D. LPrem[facility] Dba [facility] Surgery CenterPulmonary/Critical Care Medicine 08/01/2022 8:49 AM   Please see Amion for pager number to reach on-call Pulmonary and Critical Care Team.

## 2022-08-01 NOTE — Progress Notes (Signed)
Daily Progress Note  Hospital Day: 8  Chief Complaint: decompensated cirrhosis, variceal bleed, encephalopathy    Brief History [name] with a pmh not limited to Etoh abuse, DTs with seizures, Etoh cirrhosis, IDA.  Seen in consultation by [place] on 11/15 for hematemesis    Assessment:   # 41 yo female with decompensated Etoh cirrhosis.  Admitted with hematemesis s/p EGD with banding of Grade II and grade III esophageal varices with stigmata of recent bleeding    So far no evidence for recurrent GI bleeding.  Hgb stable to 8.4.   Coagulopathy. Received several doses of vit K. INR stable at 1.9 on 11/20  [place] negative for liver lesions or ascites.   Persistent encephalopathy, remains intubated but in process of weaning.  Head CT scan on 11/16 without acute findings. EEG - nonspecific encephalopathy.  Ammonia 146 yesterday.  Had been getting lactulose enemas but now getting through [place]. She is not responsive right now but got 3 doses of fentanyl this am. Having to get fentanyl but bucking vent / gagging when she wakes up.    # Electrolyte abnormalities. Repletion in progress.   Plan:   Continue enteral feeding. Feedings held part of yesterday ( tube disloged). Only at 10 ml / hr but working toward goal of 55 ml / hr Continue Lactulose 30 gms TID Continue Xifaxan  Continue IV PPI to help prevent post banding ulcers Plan is for repeat EGD with banding in ~ 3 weeks. Hopefully she will comply. If not then will likely be released from [facility].   Endoscopic studies:  07/25/22 EGD -No gross lesions in the proximal esophagus and in the mid esophagus. - Grade II and grade III esophageal varices with stigmata of recent bleeding found in the distal esophagus. Completely eradicated. Banded. - Z-line irregular, 39 cm from the incisors. - 1 cm hiatal hernia. - Portal hypertensive gastropathy throughout. 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 Treated with argon  plasma coagulation (APC). Clip (MR conditional) was placed. Clip manufacturer: [employer]. - No gross lesions in the duodenal bulb, in the first portion of the duodenum and in the second portion of the duodenum.  Lab Results: Recent Labs    07/30/22 0522 07/31/22 0442  WBC 5.2 3.5*  HGB 8.7* 8.4*  HCT 29.7* 28.0*  PLT 57* 63*   BMET Recent Labs    07/30/22 0522 07/31/22 0442 07/31/22 1626 08/01/22 0502  NA 141 141  --  139  K 3.3* 2.9* 2.9* 2.7*  CL 114* 112*  --  113*  CO2 23 23  --  22  GLUCOSE 120* 130*  --  149*  BUN <5* <5*  --  <5*  CREATININE <0.30* 0.41*  --  0.41*  CALCIUM 7.5* 7.8*  --  7.8*   LFT Recent Labs    07/31/22 0442  PROT 7.1  ALBUMIN 2.3*  AST 51*  ALT 9  ALKPHOS 123  BILITOT 3.7*   PT/INR Recent Labs    07/30/22 0522  LABPROT 21.8*  INR 1.9*     Scheduled inpatient medications:   Chlorhexidine Gluconate Cloth  6 each Topical Daily   feeding supplement (PROSource TF20)  60 mL Per Tube Daily   folic acid  1 mg Intravenous Daily   free water  30 mL Per Tube Q6H   insulin aspart  0-9 Units Subcutaneous Q4H   lactulose  30 g Per Tube TID   mouth rinse  15  mL Mouth Rinse Q2H   pantoprazole  40 mg Intravenous Q12H   potassium chloride  40 mEq Per Tube BID   rifaximin  550 mg Per Tube BID   Continuous inpatient infusions:   sodium chloride 10 mL/hr (08/01/22 0615)   dextrose 50 mL/hr at 08/01/22 0932   feeding supplement (VITAL 1.5 CAL) 1,000 mL (08/01/22 1044)   PRN inpatient medications: sodium chloride, fentaNYL (SUBLIMAZE) injection, LORazepam, mouth rinse  Vital signs in last 24 hours: Temp:  [98.4 F (36.9 C)-99.3 F (37.4 C)] 99.3 F (37.4 C) (11/22 1300) Pulse Rate:  [80-99] 92 (11/22 1300) Resp:  [9-19] 14 (11/22 1300) BP: (95-133)/(52-86) 124/78 (11/22 1300) SpO2:  [96 %-100 %] 100 % (11/22 1300) FiO2 (%):  [30 %] 30 % (11/22 1114) Weight:  [73.3 kg] 73.3 kg (11/22 0500) Last BM Date :  07/31/22  Intake/Output Summary (Last 24 hours) at 08/01/2022 1408 Last data filed at 08/01/2022 0932 Gross per 24 hour  Intake 1474.87 ml  Output 2490 ml  Net -1015.13 ml    Intake/Output from previous day: 11/21 0701 - 11/22 0700 In: 1515.1 [I.V.:971.5; IV Piggyback:543.6] Out: 7001 [Urine:1600; VCBSW:9675] Intake/Output this shift: Total I/O In: 638.1 [I.V.:373.8; IV Piggyback:264.3] Out: 45 [Stool:45]   Physical Exam:  General: not responsive other than to pain per nurse. Heart:  Regular rate and rhythm. No lower extremity edema Pulmonary: intubated on ventilator. No wheezing / crackles in chest Abdomen: Soft, nondistended, nontender. Normal bowel sounds.     Principal Problem:   GI bleed Active Problems:   Encephalopathy, hepatic (HCC)   Alcoholic cirrhosis (HCC)   Alcohol withdrawal syndrome with complication (HCC)   Acute upper GI bleed   Bleeding esophageal varices (HCC)   AVM (arteriovenous malformation) of small bowel, acquired with hemorrhage   Esophageal varices in cirrhosis ([place])   Hematemesis    LOS: 7 days   Tye Savoy ,NP 08/01/2022, 2:08 PM

## 2022-08-01 NOTE — Progress Notes (Signed)
[facility] ADULT ICU REPLACEMENT PROTOCOL   The patient does apply for the [facility] Adult ICU Electrolyte Replacment Protocol based on the criteria listed below:   1.Exclusion criteria: TCTS, ECMO, Dialysis, and Myasthenia Gravis patients 2. Is GFR >/= 30 ml/min? Yes.    Patient's GFR today is >60 3. Is SCr </= 2? Yes.   Patient's SCr is 0.41 mg/dL 4. Did SCr increase >/= 0.5 in 24 hours? No. 5.Pt's weight >40kg  Yes.   6. Abnormal electrolyte(s): K+ 2.7, phos 2.1, Mg 1.7 7. Electrolytes replaced per protocol 8.  Call MD STAT for K+ </= 2.5, Phos </= 1, or Mag </= 1 Physician:  Dr. Sande Brothers, Janace Hoard 08/01/2022 5:57 AM

## 2022-08-02 DIAGNOSIS — R7989 Other specified abnormal findings of blood chemistry: Secondary | ICD-10-CM | POA: Diagnosis not present

## 2022-08-02 DIAGNOSIS — I8511 Secondary esophageal varices with bleeding: Secondary | ICD-10-CM | POA: Diagnosis not present

## 2022-08-02 DIAGNOSIS — K7682 Hepatic encephalopathy: Secondary | ICD-10-CM | POA: Diagnosis not present

## 2022-08-02 DIAGNOSIS — R401 Stupor: Secondary | ICD-10-CM

## 2022-08-02 DIAGNOSIS — K7030 Alcoholic cirrhosis of liver without ascites: Secondary | ICD-10-CM | POA: Diagnosis not present

## 2022-08-02 LAB — CBC WITH DIFFERENTIAL/PLATELET
Abs Immature Granulocytes: 0.04 10*3/uL (ref 0.00–0.07)
Basophils Absolute: 0 10*3/uL (ref 0.0–0.1)
Basophils Relative: 0 %
Eosinophils Absolute: 0.1 10*3/uL (ref 0.0–0.5)
Eosinophils Relative: 2 %
HCT: 31.7 % — ABNORMAL LOW (ref 36.0–46.0)
Hemoglobin: 9.1 g/dL — ABNORMAL LOW (ref 12.0–15.0)
Immature Granulocytes: 1 %
Lymphocytes Relative: 31 %
Lymphs Abs: 1.5 10*3/uL (ref 0.7–4.0)
MCH: 27.5 pg (ref 26.0–34.0)
MCHC: 28.7 g/dL — ABNORMAL LOW (ref 30.0–36.0)
MCV: 95.8 fL (ref 80.0–100.0)
Monocytes Absolute: 1 10*3/uL (ref 0.1–1.0)
Monocytes Relative: 21 %
Neutro Abs: 2.2 10*3/uL (ref 1.7–7.7)
Neutrophils Relative %: 45 %
Platelets: 91 10*3/uL — ABNORMAL LOW (ref 150–400)
RBC: 3.31 MIL/uL — ABNORMAL LOW (ref 3.87–5.11)
RDW: 23.5 % — ABNORMAL HIGH (ref 11.5–15.5)
WBC: 4.8 10*3/uL (ref 4.0–10.5)
nRBC: 0 % (ref 0.0–0.2)

## 2022-08-02 LAB — BASIC METABOLIC PANEL
Anion gap: 5 (ref 5–15)
BUN: <5 mg/dL — ABNORMAL LOW (ref 6–20)
CO2: 20 mmol/L — ABNORMAL LOW (ref 22–32)
Calcium: 8.6 mg/dL — ABNORMAL LOW (ref 8.9–10.3)
Chloride: 117 mmol/L — ABNORMAL HIGH (ref 98–111)
Creatinine, Ser: 0.52 mg/dL (ref 0.44–1.00)
GFR, Estimated: >60 mL/min (ref >60)
Glucose, Bld: 113 mg/dL — ABNORMAL HIGH (ref 70–99)
Potassium: 3.4 mmol/L — ABNORMAL LOW (ref 3.5–5.1)
Sodium: 142 mmol/L (ref 135–145)

## 2022-08-02 LAB — HEPATIC FUNCTION PANEL
ALT: 8 U/L (ref 0–44)
AST: 38 U/L (ref 15–41)
Albumin: 2.5 g/dL — ABNORMAL LOW (ref 3.5–5.0)
Alkaline Phosphatase: 117 U/L (ref 38–126)
Bilirubin, Direct: 1.7 mg/dL — ABNORMAL HIGH (ref 0.0–0.2)
Indirect Bilirubin: 1.6 mg/dL — ABNORMAL HIGH (ref 0.3–0.9)
Total Bilirubin: 3.3 mg/dL — ABNORMAL HIGH (ref 0.3–1.2)
Total Protein: 7.9 g/dL (ref 6.5–8.1)

## 2022-08-02 LAB — GLUCOSE, CAPILLARY
Glucose-Capillary: 104 mg/dL — ABNORMAL HIGH (ref 70–99)
Glucose-Capillary: 106 mg/dL — ABNORMAL HIGH (ref 70–99)
Glucose-Capillary: 107 mg/dL — ABNORMAL HIGH (ref 70–99)
Glucose-Capillary: 108 mg/dL — ABNORMAL HIGH (ref 70–99)
Glucose-Capillary: 109 mg/dL — ABNORMAL HIGH (ref 70–99)
Glucose-Capillary: 139 mg/dL — ABNORMAL HIGH (ref 70–99)
Glucose-Capillary: 149 mg/dL — ABNORMAL HIGH (ref 70–99)

## 2022-08-02 LAB — AMMONIA: Ammonia: 119 umol/L — ABNORMAL HIGH (ref 9–35)

## 2022-08-02 LAB — MAGNESIUM
Magnesium: 1.6 mg/dL — ABNORMAL LOW (ref 1.7–2.4)
Magnesium: 2.9 mg/dL — ABNORMAL HIGH (ref 1.7–2.4)

## 2022-08-02 LAB — PHOSPHORUS
Phosphorus: 2.6 mg/dL (ref 2.5–4.6)
Phosphorus: 2.6 mg/dL (ref 2.5–4.6)

## 2022-08-02 LAB — POTASSIUM: Potassium: 3.4 mmol/L — ABNORMAL LOW (ref 3.5–5.1)

## 2022-08-02 MED ORDER — FOLIC ACID 1 MG PO TABS
1.0000 mg | ORAL_TABLET | Freq: Every day | ORAL | Status: DC
  Administered 2022-08-03 – 2022-08-11 (×9): 1 mg
  Filled 2022-08-02 (×10): qty 1

## 2022-08-02 MED ORDER — ZINC SULFATE 220 (50 ZN) MG PO CAPS
220.0000 mg | ORAL_CAPSULE | Freq: Two times a day (BID) | ORAL | Status: DC
  Administered 2022-08-02 – 2022-08-08 (×13): 220 mg via ORAL
  Filled 2022-08-02 (×13): qty 1

## 2022-08-02 MED ORDER — POTASSIUM CHLORIDE 20 MEQ PO PACK
40.0000 meq | PACK | Freq: Once | ORAL | Status: AC
  Administered 2022-08-02: 40 meq
  Filled 2022-08-02: qty 2

## 2022-08-02 MED ORDER — MAGNESIUM SULFATE 2 GM/50ML IV SOLN
2.0000 g | Freq: Once | INTRAVENOUS | Status: AC
  Administered 2022-08-02: 2 g via INTRAVENOUS
  Filled 2022-08-02: qty 50

## 2022-08-02 MED ORDER — PANTOPRAZOLE SODIUM 40 MG IV SOLR
40.0000 mg | Freq: Two times a day (BID) | INTRAVENOUS | Status: DC
  Administered 2022-08-02 – 2022-08-11 (×19): 40 mg via INTRAVENOUS
  Filled 2022-08-02 (×19): qty 10

## 2022-08-02 NOTE — Progress Notes (Signed)
NAME:  Joann [name], MRN:  [id number], DOB:  August 04, 1981, LOS: 8 ADMISSION DATE:  07/25/2022, CONSULTATION DATE:  07/25/2022 REFERRING MD:  Dr. Ralene Bathe, CHIEF COMPLAINT:  hematemesis    History of Present Illness:  41 year old female with PMH significant for current ETOH abuse, ETOH cirrhosis, DTs w/seizures, hepatic encephalopathy, HTN, and anxiety presenting to [facility] ER with complaints of nausea with multiple episodes of hematemesis since Tuesday morning with RUQ abdominal, weakness, unable to eat in three days, and several days of dark stools. Drinks a gallon of vodka daily, last drink was 11/14.  Hx of seizures when she tries to quit.  Multiple episodes of small dark bloody emesis in ER.  Hgb 9.7, INR 1.6, plts 76.   Admitted to PCCM.  Given FFP, PRBC, and txa.  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    Pertinent  Medical History  ETOH abuse, ETOH cirrhosis with grade 1 esophageal varices, portal HTN gastropathy, DTs w/seizures, hepatic encephalopathy, HTN, anxiety  Previously f/b [place] [facility] Events: Including procedures, antibiotic start and stop dates in addition to other pertinent events   11/15 Admitted hematemesis, intubated/ EGD> esophageal varices s/p 6 bands, multiple blood products/ txa/ vit k 11/16 very hard to sedate then progressively hypotensive despite decreasing sedation> CTH neg, EEG/ cerebell neg, multiple blood products for coagulapathy/ txa/ vit k, tongue biting/ clenching 11/21 Has been minimally alert until today with intermittent following of commands per overnight RN  Interim History / Subjective:   Mental status remains unchanged with intermittent agitation with biting on tube. Does not follow commands Good BM with lactulose, rifaximin and TF  Objective   Blood pressure 118/74, pulse 98, temperature 100 F (37.8 C), resp.  rate 15, height 5' 4"  (1.626 m), weight 73.3 kg, SpO2 98 %.    Vent Mode: PSV;CPAP FiO2 (%):  [30 %] 30 % Set Rate:  [12 bmp] 12 bmp Vt Set:  [430 mL] 430 mL PEEP:  [5 cmH20] 5 cmH20 Pressure Support:  [10 cmH20] 10 cmH20 Plateau Pressure:  [9 cmH20-14 cmH20] 14 cmH20   Intake/Output Summary (Last 24 hours) at 08/02/2022 0823 Last data filed at 08/02/2022 2725 Gross per 24 hour  Intake 1612.49 ml  Output 2150 ml  Net -537.51 ml   Filed Weights   07/31/22 0509 08/01/22 0500 08/02/22 0500  Weight: 73.7 kg 73.3 kg 73.3 kg   Physical Exam: General: Chronically ill-appearing, sedated, intermittently agitated HENT: , AT, ETT in place Eyes: EOMI, no scleral icterus Respiratory: Clear to auscultation bilaterally.  No crackles, wheezing or rales Cardiovascular: RRR, -M/R/G, no JVD GI: BS+, soft, nontender Extremities:-Edema,-tenderness Neuro: Sedated, breathing over rate GU: Foley in place  K3.4 CO2 20 Ammonia 147>119  Stable Hg  Phos 2.6 Mg 1.8   Resolved Hospital Problem list   Hemorrhagic shock +/- septic shock. Sepsis ruled out Stap epi contaminant in blood culture  Assessment & Plan:   Acute metabolic encephalopathy - agitated but no purposeful activity. Difficult to assess with intermittent sedation Hepatic encephalopathy Hyperammonemia Hypoactive delirium CT head NAICA. EEG 11/17 neg - Minimized sedating medications - Continue lactulose TID and rifaximin - Order MRI brain   Upper GIB 2/2 bleeding grade III esophageal varices s/p banding, portal hypertensive gastropathy and duodenum lesion s/p APC and clip ETOH cirrhosis ETOH abuse, ongoing, 1 gallon of vodka daily, last drink 11/14 Portal hypertensive  gastropathy  Hepatic encephalopathy  - discriminant score 26.3, steroids deferred at admission - EGD 11/15 by Dr. Rush Landmark Grade II and III esophageal varices w/ signs of recent bleeding s/p 6 bands, portal hypertensive gastropathy, single bleeding  angiodysplastic lesion in duodenum s/p APC and clip  (prior EGD 06/26/22 showed one single column of grade I esophageal varices  - Completed SBP ppx Plan:  - trend CBC - IV PPI BID - recs for repeat EGD in 3-4 weeks - continue lactulose and rifaximin  Acute hypoxic respiratory failure Intubated for EGD 11/15 and left on MV overnight to monitor for rebleeding of banded varices  - Tolerating PS - SBT/WUA daily. Extubation precluded by mental status Day 8 on vent - Full vent support. VAP bundle - wean FiO2 as able for SpO2 >92%   Acute on chronic Anemia - stable Coagulapathy - acute on chronic thrombocytopenia, elevated INR - 2 units FFP now with TXA f/b 2 units PRBC 11/15 - 1 FFP, 1 platelets, 1 cryo, txa, vit K 11/16 - 1 FFP 11/17 Thrombocytopenia slightly improving to 63 P:  - trend cbc  Hypokalemia - improving Hypocalcemia  Hypophos  Hypomagnesia  - correct electrolytes - Repleted K and Phos, Mg - F/u PM K and Phos  Elevated bilirubin - improved Transaminitis - improved Unclear if cholestasis of sepsis, alc hep. Doppler without evidence of clot.  - trend  Hypoglycemia - improved on TF Likely related to liver dysfunction, critically illness, and NPO - DC D10 gtt - TF - CBGs q4hr  PAF HTN - hold home coreg   - had brief episode of afib 11/15.  Remains in [place] contraindicated, and likely just related to critical illness  - remains NSR/ ST on telemetry.  Continue to monitor  - keep K> 4, Mag > 2  Urinary retention - foley placed 11/16  - strict I/Os - Trial off foley  Moderate Malnutrition related to chronic illness - appreciate RD consult - Monitor for refeeding syndrome - Monitor BID lytes  High risk for ETOH withdrawal, hx of Dts ETOH abuse - daily 1 gallon of vodka, last drink 11/14 - stopped phenobarb taper 11/18 given poor responsiveness off continuous sedation all day - high dose thiamine x 2 days then 178m daily (started 11/16) - cont  thiamine, folate IV - prn ativan q 2 - seizure precautions - will need TOC closer to discharge for options/ resources> ?inpt rehab facility  Best Practice (right click and "Reselect all SmartList Selections" daily)   Diet/type: tubefeeds. D10 gtt DVT prophylaxis: SCD GI prophylaxis: PPI Lines: Central line Foley:  Yes, and it is still needed Code Status:  full code Last date of multidisciplinary goals of care discussion:  Husband, TCelesta [phone](truck driver). Advises if he can not answer phone to leave a message and he would call back.   Husband, TCelesta Gentileupdated by phone and bedside on 11/22  Critical care time: 40 mins    The patient is critically ill with multiple organ systems failure and requires high complexity decision making for assessment and support, frequent evaluation and titration of therapies, application of advanced monitoring technologies and extensive interpretation of multiple databases.    JRodman Pickle M.D. LSaint [facility] BereaPulmonary/Critical Care Medicine 08/02/2022 8:26 AM   Please see Amion for pager number to reach on-call Pulmonary and Critical Care Team.

## 2022-08-02 NOTE — Progress Notes (Signed)
[facility] ADULT ICU REPLACEMENT PROTOCOL   The patient does apply for the [facility] Adult ICU Electrolyte Replacment Protocol based on the criteria listed below:   1.Exclusion criteria: TCTS, ECMO, Dialysis, and Myasthenia Gravis patients 2. Is GFR >/= 30 ml/min? Yes.    Patient's GFR today is >60 3. Is SCr </= 2? Yes.   Patient's SCr is 0.52 mg/dL 4. Did SCr increase >/= 0.5 in 24 hours? Yes.   5.Pt's weight >40kg  No. 6. Abnormal electrolyte(s): K+ 3.4  7. Electrolytes replaced per protocol 8.  Call MD STAT for K+ </= 2.5, Phos </= 1, or Mag </= 1 Physician:  Theador Hawthorne 08/02/2022 6:36 AM

## 2022-08-02 NOTE — Progress Notes (Signed)
Gastroenterology Inpatient Follow-up Note   PATIENT IDENTIFICATION  Joann [name] is a 41 y.o. female with a pmh significant for alcoholic cirrhosis (complicated by portal hypertension manifested as EVs, PHG, PSE), severe alcohol withdrawals/seizures/DTs, hypertension.  Admitted to the hospital with recurrent alcohol withdrawal esophageal variceal banding persistent difficulty weaning severe alcohol withdrawal. Hospital Day: 9  SUBJECTIVE  The patient's chart has been reviewed. The patient's labs have been reviewed. No family at bedside. Case reviewed with RN taking care of her today. Patient remains difficult to awaken due to obtundation.  She has some painful movement to noxious stimuli.   OBJECTIVE  Scheduled Inpatient Medications:   Chlorhexidine Gluconate Cloth  6 each Topical Daily   feeding supplement (PROSource TF20)  60 mL Per Tube Daily   [START ON 46/65/9935] folic acid  1 mg Per Tube Daily   free water  30 mL Per Tube Q6H   insulin aspart  0-9 Units Subcutaneous Q4H   lactulose  30 g Per Tube TID   mouth rinse  15 mL Mouth Rinse Q2H   pantoprazole (PROTONIX) IV  40 mg Intravenous Q12H   potassium chloride  40 mEq Per Tube BID   rifaximin  550 mg Per Tube BID   zinc sulfate  220 mg Oral BID   Continuous Inpatient Infusions:   sodium chloride 10 mL/hr (08/01/22 0615)   feeding supplement (VITAL 1.5 CAL) 55 mL/hr at 08/02/22 1033   PRN Inpatient Medications: sodium chloride, fentaNYL (SUBLIMAZE) injection, LORazepam, mouth rinse   Physical Examination  Temp:  [99.1 F (37.3 C)-100 F (37.8 C)] 100 F (37.8 C) (11/23 0900) Pulse Rate:  [87-119] 114 (11/23 1000) Resp:  [10-18] 16 (11/23 1000) BP: (100-151)/(58-93) 142/93 (11/23 1000) SpO2:  [93 %-100 %] 98 % (11/23 1126) FiO2 (%):  [30 %] 30 % (11/23 1126) Weight:  [73.3 kg] 73.3 kg (11/23 0500) Temp (24hrs), Avg:99.7 F (37.6 C), Min:99.1 F (37.3 C), Max:100 F (37.8 C)  Weight: 73.3 kg GEN:  Obtunded PSYCH: Unable to assess EYE: Icteric sclerae but not pinpoint ENT: Dry MM, NG tube in place CV: Tachycardic RESP: Ventilatory breath sounds present GI: NABS, protuberant abdomen, rounded MSK/EXT: Lower extremity edema present SKIN: Jaundiced NEURO: Unable to cooperate   Review of Data   Laboratory Studies   Recent Labs  Lab 08/01/22 0502 08/01/22 1706 08/02/22 0400 08/02/22 0933  NA 139  --  142  --   K 2.7*  --  3.4*  --   CL 113*  --  117*  --   CO2 22  --  20*  --   BUN <5*  --  <5*  --   CREATININE 0.41*  --  0.52  --   GLUCOSE 149*  --  113*  --   CALCIUM 7.8*  --  8.6*  --   MG 1.7 1.8  --  1.6*  PHOS 2.1* 2.6  --  2.6   Recent Labs  Lab 08/02/22 0933  AST 38  ALT 8  ALKPHOS 117    Recent Labs  Lab 07/30/22 0522 07/31/22 0442 08/02/22 0400  WBC 5.2 3.5* 4.8  HGB 8.7* 8.4* 9.1*  HCT 29.7* 28.0* 31.7*  PLT 57* 63* 91*   Recent Labs  Lab 07/26/22 1700 07/27/22 0338 07/28/22 0539 07/29/22 0506 07/30/22 0522  APTT 33  --   --   --   --   INR 1.6*   < > 1.8* 2.1* 1.9*   < > =  values in this interval not displayed.   MELD 3.0: 21 at 08/01/2022  5:02 AM MELD-Na: 19 at 08/01/2022  5:02 AM Calculated from: Serum Creatinine: 0.41 mg/dL (Using min of 1 mg/dL) at 08/01/2022  5:02 AM Serum Sodium: 139 mmol/L (Using max of 137 mmol/L) at 08/01/2022  5:02 AM Total Bilirubin: 3.7 mg/dL at 07/31/2022  4:42 AM Serum Albumin: 2.3 g/dL at 07/31/2022  4:42 AM INR(ratio): 1.9 at 07/30/2022  5:22 AM Age at listing (hypothetical): 54 years Sex: Female at 08/01/2022  5:02 AM   Imaging Studies  DG CHEST PORT 1 VIEW  Result Date: 07/31/2022 CLINICAL DATA:  30092 Hypoxemia 33007 EXAM: PORTABLE CHEST 1 VIEW COMPARISON:  Chest x-ray 07/27/2022 FINDINGS: Endotracheal tube with tip terminating 7 cm above the carina. Enteric tube coursing below the hemidiaphragm with tip overlying the expected region of the gastric antrum. The heart and mediastinal contours  are within normal limits. Bibasilar streaky airspace opacities suggestive of atelectasis. No focal consolidation. No pulmonary edema. No pleural effusion. No pneumothorax. No acute osseous abnormality.  Right upper quadrant surgical clips. IMPRESSION: Bibasilar streaky airspace opacities suggestive of atelectasis. Electronically Signed   By: Iven Finn M.D.   On: 07/31/2022 19:19   DG Abd 1 View  Result Date: 07/31/2022 CLINICAL DATA:  Nasogastric tube placement. EXAM: ABDOMEN - 1 VIEW COMPARISON:  Same day. FINDINGS: Distal tip of feeding tube is seen in expected position of distal stomach. IMPRESSION: Distal tip of feeding tube is seen in expected position of distal stomach. Electronically Signed   By: Marijo Conception M.D.   On: 07/31/2022 12:33    GI Procedures and Studies  No new relevant studies to review   ASSESSMENT  [name] is a 41 y.o. female with a pmh significant for alcoholic cirrhosis (complicated by portal hypertension manifested as EVs, PHG, PSE), severe alcohol withdrawals/seizures/DTs, hypertension.  Admitted to the hospital with recurrent alcohol withdrawal esophageal variceal banding persistent difficulty weaning severe alcohol withdrawal.  No significant change from yesterday to today.  She is putting out a decent amount of bowel movements so hopefully that will help.  I am concerned that this is taking much longer than normal for her in regards to her withdrawal and her significant obtundation although from a neurologic standpoint does not seem like she has any focal findings otherwise.  Looks like our ICU team will be moving forward with a brain MRI for further imaging and I do agree at least some sort of cross-sectional head imaging makes sense while we continue lactulose therapy and Xifaxan therapy.  Although her MELD score is stabilizing and she is not having any evidence of overt GI bleeding it is important to continue her oral nutrition and titrate that upwards as able.   Certainly hope that any imaging does not show any evidence of a significant abnormality or stroke as this will complicate things even further.  However her overall clinical status and nonimprovement is concerning.  I will add zinc sulfate if this is continued portosystemic encephalopathy issues.  Her clinical situation remains grave at this time even though her MELD score has improved.   PLAN/RECOMMENDATIONS  Continue lactulose 3-4 times daily Continue Xifaxan twice daily Start zinc to 20 mg twice daily Sodium benzoate has been used in the past and may need to be considered if aggressive ammonia levels do not improve Hepatic LOLA may be something to consider as well if aggressive ammonia levels do not improve Continue PPI 40 twice daily IV for  now Titrate feeding as able agree with cross-sectional imaging that has been ordered   Please page/call with questions or concerns.   Justice Britain, MD [facility] Office # [phone]    LOS: 8 days  Irving Copas  08/02/2022, 11:43 AM

## 2022-08-03 DIAGNOSIS — I8511 Secondary esophageal varices with bleeding: Secondary | ICD-10-CM | POA: Diagnosis not present

## 2022-08-03 DIAGNOSIS — K7030 Alcoholic cirrhosis of liver without ascites: Secondary | ICD-10-CM | POA: Diagnosis not present

## 2022-08-03 DIAGNOSIS — R7989 Other specified abnormal findings of blood chemistry: Secondary | ICD-10-CM | POA: Diagnosis not present

## 2022-08-03 DIAGNOSIS — K7682 Hepatic encephalopathy: Secondary | ICD-10-CM | POA: Diagnosis not present

## 2022-08-03 LAB — BASIC METABOLIC PANEL
Anion gap: 5 (ref 5–15)
BUN: <5 mg/dL — ABNORMAL LOW (ref 6–20)
CO2: 20 mmol/L — ABNORMAL LOW (ref 22–32)
Calcium: 8.5 mg/dL — ABNORMAL LOW (ref 8.9–10.3)
Chloride: 119 mmol/L — ABNORMAL HIGH (ref 98–111)
Creatinine, Ser: 0.44 mg/dL (ref 0.44–1.00)
GFR, Estimated: >60 mL/min (ref >60)
Glucose, Bld: 140 mg/dL — ABNORMAL HIGH (ref 70–99)
Potassium: 3.5 mmol/L (ref 3.5–5.1)
Sodium: 144 mmol/L (ref 135–145)

## 2022-08-03 LAB — MAGNESIUM
Magnesium: 1.7 mg/dL (ref 1.7–2.4)
Magnesium: 1.9 mg/dL (ref 1.7–2.4)

## 2022-08-03 LAB — CBC
HCT: 32.9 % — ABNORMAL LOW (ref 36.0–46.0)
Hemoglobin: 9.2 g/dL — ABNORMAL LOW (ref 12.0–15.0)
MCH: 27.4 pg (ref 26.0–34.0)
MCHC: 28 g/dL — ABNORMAL LOW (ref 30.0–36.0)
MCV: 97.9 fL (ref 80.0–100.0)
Platelets: 100 10*3/uL — ABNORMAL LOW (ref 150–400)
RBC: 3.36 MIL/uL — ABNORMAL LOW (ref 3.87–5.11)
RDW: 23.6 % — ABNORMAL HIGH (ref 11.5–15.5)
WBC: 5 10*3/uL (ref 4.0–10.5)
nRBC: 0 % (ref 0.0–0.2)

## 2022-08-03 LAB — GLUCOSE, CAPILLARY
Glucose-Capillary: 113 mg/dL — ABNORMAL HIGH (ref 70–99)
Glucose-Capillary: 118 mg/dL — ABNORMAL HIGH (ref 70–99)
Glucose-Capillary: 135 mg/dL — ABNORMAL HIGH (ref 70–99)
Glucose-Capillary: 97 mg/dL (ref 70–99)
Glucose-Capillary: 116 mg/dL — ABNORMAL HIGH (ref 70–99)

## 2022-08-03 LAB — AMMONIA: Ammonia: 109 umol/L — ABNORMAL HIGH (ref 9–35)

## 2022-08-03 LAB — PHOSPHORUS
Phosphorus: 3 mg/dL (ref 2.5–4.6)
Phosphorus: 3.2 mg/dL (ref 2.5–4.6)

## 2022-08-03 MED ORDER — LACTULOSE 10 GM/15ML PO SOLN
30.0000 g | Freq: Four times a day (QID) | ORAL | Status: DC
  Administered 2022-08-03 – 2022-08-05 (×7): 30 g
  Filled 2022-08-03 (×7): qty 45

## 2022-08-03 MED ORDER — MAGNESIUM SULFATE 2 GM/50ML IV SOLN
2.0000 g | Freq: Once | INTRAVENOUS | Status: AC
  Administered 2022-08-03: 2 g via INTRAVENOUS
  Filled 2022-08-03: qty 50

## 2022-08-03 NOTE — Progress Notes (Signed)
Nutrition Follow-up  DOCUMENTATION CODES:   Not applicable  INTERVENTION:  - Continue advancing to goal TF via Dobhoff (tip in stomach) Vital 1.5 at 55 ml/h (1320 ml per day) Advance by 33m Q12H to goal (at 559mhr at 12pm today) Prosource TF20 60 ml daily Provides 2060 kcal, 109 gm protein, 1008 ml free water daily   - Monitor electrolytes closely.  - Pt currently ordered K+ replacements BID. - 3047mree water flushes Q6H per MD.  - Total free water with TF and FWF = [id number] folic acid supplementation as medically appropriate. - Continue zinc supplementation as medically appropriate.    NUTRITION DIAGNOSIS:   Moderate Malnutrition related to chronic illness (cirrhosis and esophageal varices) as evidenced by percent weight loss, mild fat depletion, mild muscle depletion. *ongoing  GOAL:   Patient will meet greater than or equal to 90% of their needs *to be met with tube feeds once at goal   MONITOR:   Vent status  REASON FOR ASSESSMENT:   Other (Comment) (verbal consult to start TPN)    ASSESSMENT:   Pt is a [age]yowith PMH of alcohol abuse, cirrhosis, recent upper GI bleed, DTs w/seizures, hepatic encephalopathy, HTN, and anxiety who presents with vomiting blood x1 day and several days of melena.  11/15 Intubated 11/16 unable to place NG tube due to esophogeal varices per GI 11/20 pt NPO w/ no nutrition x5 days due to inability to place NGT. Plan to start TPN but GI later gave okay to place NGT so RN placed 11/21 NGT dislodged 11/21 NGT replaced, TF started  Patient remains intubated, extubation difficult for to mental status.  TF increased to 45m54m this AM, goal of 55mL32m No noted intolerance to tube feeds. Last BM today with noted diarrhea however patient appears to consistently have 1 loose bowel movement daily.  Weights continue to fluctuate drastically. Weight has fluctuated between 140-167#. Pt with edema which may be affecting current  weight status.   Medications reviewed and include: Folic acid, Insulin, Potassium replacement BID, Zinc, Magnesium once time replacement  Labs reviewed:  -   Diet Order:   Diet Order     None       EDUCATION NEEDS:  Not appropriate for education at this time  Skin:  Skin Assessment: Reviewed RN Assessment Skin Integrity Issues:: Unstageable Unstageable: tongue  Last BM:  11/24  Height:  Ht Readings from Last 1 Encounters:  07/28/22 _0  (1.626 m)   Weight:  Wt Readings from Last 1 Encounters:  08/03/22 69.5 kg    BMI:  Body mass index is 26.3 kg/m.  Estimated Nutritional Needs:  Kcal:  1900-2250 kcal Protein:  85-110 grams Fluid:  >/= 1.9L    [name] For contact information, refer to [facility]

## 2022-08-03 NOTE — Progress Notes (Signed)
[facility] Progress Note Patient Name: Joann [name] DOB: Apr 03, 1981 MRN: [id number]   Date of Service  08/03/2022  HPI/Events of Note  Patient is intubated and need wrist restraints to prevent self-extubation.  eICU Interventions  Wrist restraints ordered.        Kerry Kass Barclay Lennox 08/03/2022, 7:45 PM

## 2022-08-03 NOTE — Progress Notes (Signed)
RT placed patient in wean mode 10/5 30% @ 0809. Patient appears to be tolerating well at this time. RT will continue to monitor

## 2022-08-03 NOTE — Progress Notes (Signed)
Patient ID: Joann [name], female   DOB: 1981-02-15, 41 y.o.   MRN: [id number]    Progress Note   Subjective   Day #10   CC; decompensated cirrhosis, variceal hemorrhage, EtOH withdrawal hepatic encephalopathy, prolonged ventilation  Tube feeds  Labs today-WBC 5.0/hemoglobin 9.2/hematocrit 32.9/platelets 100-stable BUN less than 5/creatinine 0.44 Ammonia pending today/119 yesterday  Remains on vent, trying to wean ,not following commands  MRI brain pending today   Objective   Vital signs in last 24 hours: Temp:  [98.5 F (36.9 C)-100 F (37.8 C)] 98.5 F (36.9 C) (11/24 0700) Pulse Rate:  [99-125] 112 (11/24 0800) Resp:  [11-34] 16 (11/24 0800) BP: (107-157)/(54-95) 139/74 (11/24 0800) SpO2:  [95 %-100 %] 100 % (11/24 0809) FiO2 (%):  [30 %] 30 % (11/24 0809) Weight:  [69.5 kg] 69.5 kg (11/24 0500) Last BM Date : 08/03/22 General:    African-American female, intubated grimaces to exam, did not respond to voice Heart:  Regular rate and rhythm; no murmurs Lungs: Respirations even and unlabored, coarse breath sounds bilaterally Abdomen:  Soft, protuberant, nontense.  Sounds present Extremities: 1+ edema bilateral lower extremities Neurologic: Intubated, intermittently opens eyes, not following commands   Intake/Output from previous day: 11/23 0701 - 11/24 0700 In: 1807.7 [NG/GT:1757.7; IV Piggyback:50] Out: 4656 [Urine:1700; CLEXN:1700] Intake/Output this shift: Total I/O In: 98 [NG/GT:98] Out: -   Lab Results: Recent Labs    08/02/22 0400 08/03/22 0557  WBC 4.8 5.0  HGB 9.1* 9.2*  HCT 31.7* 32.9*  PLT 91* 100*   BMET Recent Labs    08/01/22 0502 08/02/22 0400 08/02/22 1714 08/03/22 0557  NA 139 142  --  144  K 2.7* 3.4* 3.4* 3.5  CL 113* 117*  --  119*  CO2 22 20*  --  20*  GLUCOSE 149* 113*  --  140*  BUN <5* <5*  --  <5*  CREATININE 0.41* 0.52  --  0.44  CALCIUM 7.8* 8.6*  --  8.5*   LFT Recent Labs    08/02/22 0933  PROT 7.9  ALBUMIN 2.5*   AST 38  ALT 8  ALKPHOS 117  BILITOT 3.3*  BILIDIR 1.7*  IBILI 1.6*   PT/INR No results for input(s): "LABPROT", "INR" in the last 72 hours.       Assessment / Plan:    #79 41 year old African-American female with severe EtOH abuse, drinking a gallon of vodka daily prior to admission with decompensated cirrhosis, admitted with acute variceal bleed  Status post EGD with banding 9 days ago, and APC 2 AVM in the duodenum  No recurrent bleeding  Continue IV PPI twice daily  M ELD 3.0= 21  #2 severe EtOH withdrawal/seizures,/DTs requiring  prolonged vent support Remains obtunded despite high-dose lactulose, Xifaxan, Zinc added yesterday  CCM trying to slowly wean from vent, minimize fentanyl  For MRI brain today  Dr. Rush Landmark - consider sodium benzoate if MRI negative  #3 malnutrition-continue tube feeding #4 anemia secondary to acute blood loss stable   Principal Problem:   GI bleed Active Problems:   Encephalopathy, hepatic (HCC)   Alcoholic cirrhosis (HCC)   Alcohol withdrawal syndrome with complication (HCC)   Acute upper GI bleed   Bleeding esophageal varices (HCC)   AVM (arteriovenous malformation) of small bowel, acquired with hemorrhage   Esophageal varices in cirrhosis ([place])   Hematemesis     LOS: 9 days   Noemi Ishmael PA-C 08/03/2022, 8:32 AM

## 2022-08-03 NOTE — Progress Notes (Signed)
Patient placed back in previous mode on ventilator. Patient appeared to be agitated in wean mode. RT will continue to monitor.

## 2022-08-03 NOTE — Progress Notes (Signed)
NAME:  Joann [name], MRN:  [id number], DOB:  07-08-1981, LOS: 9 ADMISSION DATE:  07/25/2022, CONSULTATION DATE:  07/25/2022 REFERRING MD:  Dr. Ralene Bathe, CHIEF COMPLAINT:  hematemesis    History of Present Illness:  41 year old female with PMH significant for current ETOH abuse, ETOH cirrhosis, DTs w/seizures, hepatic encephalopathy, HTN, and anxiety presenting to [facility] ER with complaints of nausea with multiple episodes of hematemesis since Tuesday morning with RUQ abdominal, weakness, unable to eat in three days, and several days of dark stools. Drinks a gallon of vodka daily, last drink was 11/14.  Hx of seizures when she tries to quit.  Multiple episodes of small dark bloody emesis in ER.  Hgb 9.7, INR 1.6, plts 76.   Admitted to PCCM.  Given FFP, PRBC, and txa.  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    Pertinent  Medical History  ETOH abuse, ETOH cirrhosis with grade 1 esophageal varices, portal HTN gastropathy, DTs w/seizures, hepatic encephalopathy, HTN, anxiety  Previously f/b [facility] Events: Including procedures, antibiotic start and stop dates in addition to other pertinent events   11/15 Admitted hematemesis, intubated/ EGD> esophageal varices s/p 6 bands, multiple blood products/ txa/ vit k 11/16 very hard to sedate then progressively hypotensive despite decreasing sedation> CTH neg, EEG/ cerebell neg, multiple blood products for coagulapathy/ txa/ vit k, tongue biting/ clenching 11/21 Has been minimally alert until today with intermittent following of commands per overnight RN  Interim History / Subjective:   Minimally alert. Tolerated PS yesterday  Objective   Blood pressure 126/77, pulse (!) 103, temperature 98.5 F (36.9 C), temperature source Axillary, resp. rate 16, height 5' 4"  (1.626 m), weight 69.5 kg, SpO2 99 %.    Vent Mode:  PRVC FiO2 (%):  [30 %] 30 % Set Rate:  [12 bmp] 12 bmp Vt Set:  [430 mL] 430 mL PEEP:  [5 cmH20] 5 cmH20 Pressure Support:  [10 cmH20] 10 cmH20 Plateau Pressure:  [11 cmH20-12 cmH20] 11 cmH20   Intake/Output Summary (Last 24 hours) at 08/03/2022 0750 Last data filed at 08/03/2022 2778 Gross per 24 hour  Intake 1190.66 ml  Output 3630 ml  Net -2439.34 ml   Filed Weights   08/01/22 0500 08/02/22 0500 08/03/22 0500  Weight: 73.3 kg 73.3 kg 69.5 kg   Physical Exam: General: Chronically ill-appearing, encephalopathic HENT: [facility], AT, ETT in place Eyes: EOMI, no scleral icterus Respiratory: Clear to auscultation bilaterally.  No crackles, wheezing or rales Cardiovascular: RRR, -M/R/G, no JVD GI: BS+, soft, nontender Extremities:-Edema,-tenderness Neuro: Encephalopathic, intermittently opens eyes to voice, does not track, does not follow commands, +cough/gag GU: External foley in place  K 3.5 CO2 20 Ammonia 147>119  Stable Hg  Phos 3.0 Mg 1.7   Resolved Hospital Problem list   Hemorrhagic shock +/- septic shock. Sepsis ruled out Stap epi contaminant in blood culture  Assessment & Plan:   Acute metabolic encephalopathy - agitated but no purposeful activity. Difficult to assess with intermittent sedation Hepatic encephalopathy Hyperammonemia Hypoactive delirium CT head NAICA. EEG 11/17 neg - Minimized sedating medications - Continue lactulose TID and rifaximin - Order MRI Brain  Upper GIB 2/2 bleeding grade III esophageal varices s/p banding, portal hypertensive gastropathy and duodenum lesion s/p APC and clip ETOH cirrhosis ETOH abuse, ongoing, 1 gallon of vodka daily, last drink 11/14 Portal hypertensive gastropathy  Hepatic encephalopathy  -  discriminant score 26.3, steroids deferred at admission - EGD 11/15 by Dr. 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 Extubation precluded by mental status Day 9 on vent - Full vent support. VAP bundle - wean FiO2 as able for SpO2 >92%   Acute on chronic Anemia - stable Coagulapathy - acute on chronic thrombocytopenia, elevated INR - 2 units FFP now with TXA f/b 2 units PRBC 11/15 - 1 FFP, 1 platelets, 1 cryo, txa, vit K 11/16 - 1 FFP 11/17 Thrombocytopenia slightly improving to 63 P:  - trend cbc  Hypokalemia - improving Hypocalcemia  Hypophos  Hypomagnesia  - correct electrolytes - Trend K and Phos, Mg - F/u PM K and Phos  Elevated bilirubin - improved Transaminitis - improved Unclear if cholestasis of sepsis, alc hep. Doppler without evidence of clot.  - trend  Hypoglycemia - improved on TF Likely related to liver dysfunction, critically illness, and NPO - TF - CBGs q4hr  PAF HTN - hold home coreg   - had brief episode of afib 11/15.  Remains in [place] contraindicated, and likely just related to critical illness  - remains NSR/ ST on telemetry.  Continue to monitor  - keep K> 4, Mag > 2  Urinary retention - strict I/Os - Trial off foley 11/24 - Monitor UOP  Moderate Malnutrition related to chronic illness - appreciate RD consult - Monitor for refeeding syndrome - Monitor BID lytes  High risk for ETOH withdrawal, hx of Dts ETOH abuse - daily 1 gallon of vodka, last drink 11/14 - stopped phenobarb taper 11/18 given poor responsiveness off continuous sedation all day - high dose thiamine x 2 days then 116m daily (started 11/16) - cont thiamine, folate  IV - prn ativan q 2 - seizure precautions - will need TOC closer to discharge for options/ resources> ?inpt rehab facility  Best Practice (right click and "Reselect all SmartList Selections" daily)   Diet/type: tubefeeds DVT prophylaxis: SCD GI prophylaxis: PPI Lines: Central line Foley:  Yes, and it is still needed Code Status:  full code Last date of multidisciplinary goals of care discussion:  Husband, TCelesta [phone](truck driver). Advises if he can not answer phone to leave a message and he would call back.   Husband, TCelesta Gentileupdated by phone and bedside on 11/22  Critical care time: 40 mins    The patient is critically ill with multiple organ systems failure and requires high complexity decision making for assessment and support, frequent evaluation and titration of therapies, application of advanced monitoring technologies and extensive interpretation of multiple databases.    JRodman Pickle M.D. [facility] Center[facility]ulmonary/Critical Care Medicine 08/03/2022 7:50 AM   Please see Amion for pager number to reach on-call Pulmonary and Critical Care Team.

## 2022-08-04 DIAGNOSIS — K7290 Hepatic failure, unspecified without coma: Secondary | ICD-10-CM | POA: Diagnosis not present

## 2022-08-04 DIAGNOSIS — K7460 Unspecified cirrhosis of liver: Secondary | ICD-10-CM

## 2022-08-04 DIAGNOSIS — K2289 Other specified disease of esophagus: Secondary | ICD-10-CM

## 2022-08-04 DIAGNOSIS — D62 Acute posthemorrhagic anemia: Secondary | ICD-10-CM | POA: Diagnosis not present

## 2022-08-04 DIAGNOSIS — K7682 Hepatic encephalopathy: Secondary | ICD-10-CM | POA: Diagnosis not present

## 2022-08-04 LAB — BASIC METABOLIC PANEL
Anion gap: 5 (ref 5–15)
BUN: 6 mg/dL (ref 6–20)
CO2: 23 mmol/L (ref 22–32)
Calcium: 8.6 mg/dL — ABNORMAL LOW (ref 8.9–10.3)
Chloride: 122 mmol/L — ABNORMAL HIGH (ref 98–111)
Creatinine, Ser: 0.43 mg/dL — ABNORMAL LOW (ref 0.44–1.00)
GFR, Estimated: >60 mL/min (ref >60)
Glucose, Bld: 117 mg/dL — ABNORMAL HIGH (ref 70–99)
Potassium: 3.8 mmol/L (ref 3.5–5.1)
Sodium: 150 mmol/L — ABNORMAL HIGH (ref 135–145)

## 2022-08-04 LAB — SODIUM: Sodium: 149 mmol/L — ABNORMAL HIGH (ref 135–145)

## 2022-08-04 LAB — GLUCOSE, CAPILLARY
Glucose-Capillary: 118 mg/dL — ABNORMAL HIGH (ref 70–99)
Glucose-Capillary: 118 mg/dL — ABNORMAL HIGH (ref 70–99)
Glucose-Capillary: 118 mg/dL — ABNORMAL HIGH (ref 70–99)
Glucose-Capillary: 137 mg/dL — ABNORMAL HIGH (ref 70–99)
Glucose-Capillary: 93 mg/dL (ref 70–99)

## 2022-08-04 LAB — MAGNESIUM
Magnesium: 1.8 mg/dL (ref 1.7–2.4)
Magnesium: 2.3 mg/dL (ref 1.7–2.4)

## 2022-08-04 LAB — PHOSPHORUS
Phosphorus: 3.6 mg/dL (ref 2.5–4.6)
Phosphorus: 3.8 mg/dL (ref 2.5–4.6)

## 2022-08-04 MED ORDER — DEXMEDETOMIDINE HCL IN NACL 400 MCG/100ML IV SOLN
0.4000 ug/kg/h | INTRAVENOUS | Status: DC
  Administered 2022-08-04: 0.4 ug/kg/h via INTRAVENOUS
  Filled 2022-08-04: qty 100

## 2022-08-04 MED ORDER — FREE WATER
200.0000 mL | Status: DC
  Administered 2022-08-04 (×2): 200 mL

## 2022-08-04 MED ORDER — MAGNESIUM SULFATE 2 GM/50ML IV SOLN
2.0000 g | Freq: Once | INTRAVENOUS | Status: AC
  Administered 2022-08-04: 2 g via INTRAVENOUS
  Filled 2022-08-04: qty 50

## 2022-08-04 MED ORDER — CLONAZEPAM 0.5 MG PO TABS
0.5000 mg | ORAL_TABLET | Freq: Two times a day (BID) | ORAL | Status: DC
  Administered 2022-08-04 – 2022-08-06 (×4): 0.5 mg via ORAL
  Filled 2022-08-04 (×4): qty 1

## 2022-08-04 MED ORDER — QUETIAPINE FUMARATE 50 MG PO TABS
25.0000 mg | ORAL_TABLET | Freq: Every day | ORAL | Status: DC
  Administered 2022-08-04: 25 mg via ORAL
  Filled 2022-08-04: qty 1

## 2022-08-04 MED ORDER — FREE WATER
300.0000 mL | Status: DC
  Administered 2022-08-04 – 2022-08-09 (×22): 300 mL

## 2022-08-04 MED ORDER — FREE WATER: 200.0000 mL | Freq: Four times a day (QID) | Status: DC

## 2022-08-04 NOTE — Progress Notes (Signed)
NAME:  Joann [name], MRN:  [id number], DOB:  Oct 12, 1980, LOS: 18 ADMISSION DATE:  07/25/2022, CONSULTATION DATE:  07/25/2022 REFERRING MD:  Dr. Ralene Bathe, CHIEF COMPLAINT:  hematemesis    History of Present Illness:  41 year old female with PMH significant for current ETOH abuse, ETOH cirrhosis, DTs w/seizures, hepatic encephalopathy, HTN, and anxiety presenting to [facility] ER with complaints of nausea with multiple episodes of hematemesis since Tuesday morning with RUQ abdominal, weakness, unable to eat in three days, and several days of dark stools. Drinks a gallon of vodka daily, last drink was 11/14.  Hx of seizures when she tries to quit.  Multiple episodes of small dark bloody emesis in ER.  Hgb 9.7, INR 1.6, plts 76.   Admitted to PCCM.  Given FFP, PRBC, and txa.  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    Pertinent  Medical History  ETOH abuse, ETOH cirrhosis with grade 1 esophageal varices, portal HTN gastropathy, DTs w/seizures, hepatic encephalopathy, HTN, anxiety  Previously f/b [place] [facility] Events: Including procedures, antibiotic start and stop dates in addition to other pertinent events   11/15 Admitted hematemesis, intubated/ EGD> esophageal varices s/p 6 bands, multiple blood products/ txa/ vit k 11/16 very hard to sedate then progressively hypotensive despite decreasing sedation> CTH neg, EEG/ cerebell neg, multiple blood products for coagulapathy/ txa/ vit k, tongue biting/ clenching 11/21 Has been minimally alert until today with intermittent following of commands per overnight RN 11/25 Encephalopathic but now moving extremities, not tracking  Interim History / Subjective:    Tolerated PS yesterday. Today Encephalopathic but now moving extremities, not tracking or following commands  Cancelled MRI as patient's clinical exam  improving  Objective   Blood pressure 130/69, pulse (!) 101, temperature 99 F (37.2 C), temperature source Axillary, resp. rate 15, height 5' 4"  (1.626 m), weight 69.5 kg, SpO2 99 %.    Vent Mode: PSV;CPAP FiO2 (%):  [30 %] 30 % Set Rate:  [12 bmp] 12 bmp Vt Set:  [430 mL] 430 mL PEEP:  [5 cmH20] 5 cmH20 Pressure Support:  [10 cmH20] 10 cmH20 Plateau Pressure:  [10 cmH20-18 cmH20] 11 cmH20   Intake/Output Summary (Last 24 hours) at 08/04/2022 1012 Last data filed at 08/04/2022 0800 Gross per 24 hour  Intake 1882.58 ml  Output 700 ml  Net 1182.58 ml   Filed Weights   08/01/22 0500 08/02/22 0500 08/03/22 0500  Weight: 73.3 kg 73.3 kg 69.5 kg   Physical Exam: General: Chronically ill-appearing, no acute distress, encephalopathic HENT: [place], AT, ETT in place Eyes: EOMI, no scleral icterus Respiratory: Clear to auscultation bilaterally.  No crackles, wheezing or rales Cardiovascular: RRR, -M/R/G, no JVD GI: BS+, soft, nontender Extremities:-Edema,-tenderness Neuro: Eyes open, moves extremities x 4, not tracking, not following commands, grimaces to noxious stimuli GU: External foley in place  Na 150 K 3.8 Phos 3.6 Mag 1.8  Ammonia 147>119>109  Resolved Hospital Problem list   Hemorrhagic shock +/- septic shock. Sepsis ruled out Stap epi contaminant in blood culture  Assessment & Plan:   Acute metabolic encephalopathy - agitated but no purposeful activity. Difficult to assess with intermittent sedation. Improving Hepatic encephalopathy Hyperammonemia - improving Hypoactive delirium CT head NAICA. EEG 11/17 neg - Minimize sedating medications - Continue lactulose TID and rifaximin - Cancelled MRI brain  Acute hypoxic respiratory failure Intubated for EGD 11/15 and left  on MV overnight to monitor for rebleeding of banded varices  - Tolerating PS. Extubation precluded by mental status. Day 10 on vent - SBT/WUA daily.  - Full vent support. VAP bundle - wean FiO2 as  able for SpO2 >92%   Upper GIB 2/2 bleeding grade III esophageal varices s/p banding, portal hypertensive gastropathy and duodenum lesion s/p APC and clip ETOH cirrhosis ETOH abuse, ongoing, 1 gallon of vodka daily, last drink 11/14 Portal hypertensive gastropathy  Hepatic encephalopathy  - discriminant score 26.3, steroids deferred at admission - EGD 11/15 by Dr. 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 s/p APC and clip  (prior EGD 06/26/22 showed one single column of grade I esophageal varices  - Completed SBP ppx Plan:  - trend CBC - IV PPI BID - recs for repeat EGD in 3-4 weeks - continue lactulose and rifaximin  Acute on chronic Anemia - stable Coagulapathy - acute on chronic thrombocytopenia, elevated INR - 2 units FFP now with TXA f/b 2 units PRBC 11/15 - 1 FFP, 1 platelets, 1 cryo, txa, vit K 11/16 - 1 FFP 11/17 Thrombocytopenia slightly improving to 63 P:  - trend cbc daily  Hypernatremia - new Hypokalemia - improving Hypocalcemia  Hypophos  Hypomagnesia  - correct electrolytes - Trend K and Phos, Mg - Replete - Increase FWF 200 q4h - F/u PM Na  Hypoglycemia - improved on TF Likely related to liver dysfunction, critically illness, and NPO - TF - CBGs q4hr  PAF HTN - hold home coreg   - had brief episode of afib 11/15.  Remains in [place] contraindicated, and likely just related to critical illness  - remains NSR/ ST on telemetry.  Continue to monitor  - keep K> 4, Mag > 2  Urinary retention - strict I/Os - Trial off foley 11/24 - Monitor UOP  Moderate Malnutrition related to chronic illness - appreciate RD consult - Monitor for refeeding syndrome - Monitor BID lytes  High risk for ETOH withdrawal, hx of Dts ETOH abuse - daily 1 gallon of vodka, last drink 11/14 - stopped phenobarb taper 11/18 given poor responsiveness  off continuous sedation all day - high dose thiamine x 2 days then 1109m daily (started 11/16) - cont thiamine, folate IV - prn ativan q 2 - seizure precautions - will need TOC closer to discharge for options/ resources> ?inpt rehab facility  Best Practice (right click and "Reselect all SmartList Selections" daily)   Diet/type: tubefeeds DVT prophylaxis: SCD GI prophylaxis: PPI Lines: Central line Foley:  Yes, and it is still needed Code Status:  full code Last date of multidisciplinary goals of care discussion:  Husband, TCelesta [phone](truck driver). Advises if he can not answer phone to leave a message and he would call back.   Husband, TCelesta Gentileupdated by phone and bedside on 11/22  Critical care time: 35 mins    The patient is critically ill with multiple organ systems failure and requires high complexity decision making for assessment and support, frequent evaluation and titration of therapies, application of advanced monitoring technologies and extensive interpretation of multiple databases.   JRodman Pickle M.D. [facility] LLPPulmonary/Critical Care Medicine 08/04/2022 10:12 AM   Please see Amion for pager number to reach on-call Pulmonary and Critical Care Team.

## 2022-08-04 NOTE — Progress Notes (Signed)
Patient placed back in previous mode on ventilator out of wean mode. She appears to be agitated in wean mode. RT will continue to monitor

## 2022-08-04 NOTE — Progress Notes (Addendum)
Daily Progress Note  Hospital Day: 11  Chief Complaint: decompensated cirrhosis, variceal bleed, encephalopathy     Brief History [name] with a pmh not limited to Etoh abuse, DTs with seizures, Etoh cirrhosis, IDA.  Seen in consultation by [place] on 11/15 for hematemesis. Has had prolonged course for management of persistent encephalopathy    Assessment:  # 41 yo female with decompensated Etoh cirrhosis.  Admitted with hematemesis s/p EGD with banding of Grade II and grade III esophageal varices with stigmata of recent bleeding  No further GI bleeding. Hgb stable at 9.2 ( last PRBC transfusion was on 11/15) Multifactorial encephalopathy. Head CT scan on 11/16 without acute findings. EEG - nonspecific encephalopathy. She remains intubated on Vent. She isn't sedated other than for the frequent doses of fentanyl for agitation. She isn't following commands but periodically moving extremities and responds to pain which is improvement.  Brain MRI was cancelled given clinical improvement.   She is on maximum treatment for hyperammonemia.  # Hypernatremic / hyperchloremic.  Na+ 150, Cl- 122.   Plan:   Continue IV PPI to prevent post-banding ulcers ? Give additional free water Continue Xifaxan and QID Lactulose  Objective   Endoscopic studies:  07/25/22 EGD -No gross lesions in the proximal esophagus and in the mid esophagus. - Grade II and grade III esophageal varices with stigmata of recent bleeding found in the distal esophagus. Completely eradicated. Banded. - Z-line irregular, 39 cm from the incisors. - 1 cm hiatal hernia. - Portal hypertensive gastropathy throughout. 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 Treated with argon plasma coagulation (APC). Clip (MR conditional) was placed. Clip manufacturer: [employer]. - No gross lesions in the duodenal bulb, in the first portion of the duodenum and in the second portion of the duodenum.    Imaging:   DG CHEST PORT 1 VIEW  Result Date: 07/31/2022 CLINICAL DATA:  84132 Hypoxemia 44010 EXAM: PORTABLE CHEST 1 VIEW COMPARISON:  Chest x-ray 07/27/2022 FINDINGS: Endotracheal tube with tip terminating 7 cm above the carina. Enteric tube coursing below the hemidiaphragm with tip overlying the expected region of the gastric antrum. The heart and mediastinal contours are within normal limits. Bibasilar streaky airspace opacities suggestive of atelectasis. No focal consolidation. No pulmonary edema. No pleural effusion. No pneumothorax. No acute osseous abnormality.  Right upper quadrant surgical clips. IMPRESSION: Bibasilar streaky airspace opacities suggestive of atelectasis. Electronically Signed   By: Iven Finn M.D.   On: 07/31/2022 19:19   DG Abd 1 View  Result Date: 07/31/2022 CLINICAL DATA:  Nasogastric tube placement. EXAM: ABDOMEN - 1 VIEW COMPARISON:  Same day. FINDINGS: Distal tip of feeding tube is seen in expected position of distal stomach. IMPRESSION: Distal tip of feeding tube is seen in expected position of distal stomach. Electronically Signed   By: Marijo Conception M.D.   On: 07/31/2022 12:33    Lab Results: Recent Labs    08/02/22 0400 08/03/22 0557  WBC 4.8 5.0  HGB 9.1* 9.2*  HCT 31.7* 32.9*  PLT 91* 100*   BMET Recent Labs    08/02/22 0400 08/02/22 1714 08/03/22 0557 08/04/22 0242  NA 142  --  144 150*  K 3.4* 3.4* 3.5 3.8  CL 117*  --  119* 122*  CO2 20*  --  20* 23  GLUCOSE 113*  --  140* 117*  BUN <5*  --  <5* 6  CREATININE 0.52  --  0.44 0.43*  CALCIUM  8.6*  --  8.5* 8.6*   LFT Recent Labs    08/02/22 0933  PROT 7.9  ALBUMIN 2.5*  AST 38  ALT 8  ALKPHOS 117  BILITOT 3.3*  BILIDIR 1.7*  IBILI 1.6*   PT/INR No results for input(s): "LABPROT", "INR" in the last 72 hours.   Scheduled inpatient medications:   Chlorhexidine Gluconate Cloth  6 each Topical Daily   feeding supplement (PROSource TF20)  60 mL Per Tube Daily   folic acid  1 mg  Per Tube Daily   free water  30 mL Per Tube Q6H   insulin aspart  0-9 Units Subcutaneous Q4H   lactulose  30 g Per Tube QID   mouth rinse  15 mL Mouth Rinse Q2H   pantoprazole (PROTONIX) IV  40 mg Intravenous Q12H   potassium chloride  40 mEq Per Tube BID   rifaximin  550 mg Per Tube BID   zinc sulfate  220 mg Oral BID   Continuous inpatient infusions:   sodium chloride 10 mL/hr (08/01/22 0615)   feeding supplement (VITAL 1.5 CAL) 55 mL/hr at 08/04/22 0800   PRN inpatient medications: sodium chloride, fentaNYL (SUBLIMAZE) injection, LORazepam, mouth rinse  Vital signs in last 24 hours: Temp:  [98.3 F (36.8 C)-99.6 F (37.6 C)] 99 F (37.2 C) (11/25 0350) Pulse Rate:  [96-117] 101 (11/25 0800) Resp:  [10-20] 15 (11/25 0800) BP: (90-171)/(52-113) 130/69 (11/25 0800) SpO2:  [95 %-100 %] 99 % (11/25 0800) FiO2 (%):  [30 %] 30 % (11/25 0800) Last BM Date : 08/04/22  Intake/Output Summary (Last 24 hours) at 08/04/2022 0907 Last data filed at 08/04/2022 0800 Gross per 24 hour  Intake 1882.58 ml  Output 850 ml  Net 1032.58 ml    Intake/Output from previous day: 11/24 0701 - 11/25 0700 In: 897.3 [NG/GT:847.3; IV Piggyback:50] Out: 850 [Urine:450; Stool:400] Intake/Output this shift: Total I/O In: 1083.3 [NG/GT:1083.3] Out: -    Physical Exam:  General:  female in NAD. Periodically opening eyes, moving extremities. Dobbhoff left nostril. ETT, on ventilator Heart:  Sinus tachycardia. No lower extremity edema, SCDs in place Pulmonary: Rhonchi, good air movement bilaterally.  Abdomen: Soft, non-distended,  active bowel sounds.  Neurologic: Stuporous. Not following commands. Does respond to painful stimuli  Principal Problem:   GI bleed Active Problems:   Encephalopathy, hepatic (HCC)   Alcoholic cirrhosis (HCC)   Alcohol withdrawal syndrome with complication (HCC)   Acute upper GI bleed   Bleeding esophageal varices (HCC)   AVM (arteriovenous malformation) of small  bowel, acquired with hemorrhage   Esophageal varices in cirrhosis ([place])   Hematemesis    LOS: 10 days   Tye Savoy ,NP 08/04/2022, 9:07 AM    I have taken an interval history, thoroughly reviewed the chart and examined the patient. I agree with the Advanced Practitioner's note, impression and recommendations, and have recorded additional findings, impressions and recommendations below. I performed a substantive portion of this encounter (>50% time spent), including a complete performance of the medical decision making.  My additional thoughts are as follows:  Protracted hospital stay after recurrent variceal bleed requiring endoscopic control.  No recurrence of bleeding at present, completed antibiotic prophylaxis.  Current issue is similar to previous hospitalizations with prolonged difficulty weaning her from the vent with altered mental status.  She is making urine and renal function remains good, though she is getting progressively hypernatremic.  This has been noted and addressed and critical care's note from today, they plan to  increase free water flushes for correction.  Beta-blocker therapy for varices appears to have been held since early in the hospital stay because she was bleeding and unstable.  However, if she now reverts to her baseline hypertension and continues to be tachycardic from critical illness, carvedilol or nadolol could be started.  No additional GI specific testing or changes in treatment from our perspective today.  We will follow.  (Complex medical decision making on critically ill patient)  35 minutes were spent on this encounter (including chart review, history/exam, counseling/coordination of care, and documentation) > 50% of that time was spent on counseling and coordination of care.  Nelida Meuse III Office:[phone]

## 2022-08-04 NOTE — Progress Notes (Signed)
RT placed patient in wean mode. Patient appears to be tolerating well at this time.  10/5 30%.  Rt will continue to monitior

## 2022-08-05 DIAGNOSIS — K7682 Hepatic encephalopathy: Secondary | ICD-10-CM | POA: Diagnosis not present

## 2022-08-05 DIAGNOSIS — R410 Disorientation, unspecified: Secondary | ICD-10-CM

## 2022-08-05 DIAGNOSIS — R7989 Other specified abnormal findings of blood chemistry: Secondary | ICD-10-CM | POA: Diagnosis not present

## 2022-08-05 DIAGNOSIS — J9601 Acute respiratory failure with hypoxia: Secondary | ICD-10-CM | POA: Diagnosis not present

## 2022-08-05 DIAGNOSIS — I8511 Secondary esophageal varices with bleeding: Secondary | ICD-10-CM | POA: Diagnosis not present

## 2022-08-05 LAB — COMPREHENSIVE METABOLIC PANEL
ALT: 8 U/L (ref 0–44)
AST: 57 U/L — ABNORMAL HIGH (ref 15–41)
Albumin: 2.7 g/dL — ABNORMAL LOW (ref 3.5–5.0)
Alkaline Phosphatase: 105 U/L (ref 38–126)
Anion gap: 5 (ref 5–15)
BUN: 8 mg/dL (ref 6–20)
CO2: 23 mmol/L (ref 22–32)
Calcium: 8.9 mg/dL (ref 8.9–10.3)
Chloride: 127 mmol/L — ABNORMAL HIGH (ref 98–111)
Creatinine, Ser: 0.41 mg/dL — ABNORMAL LOW (ref 0.44–1.00)
GFR, Estimated: >60 mL/min (ref >60)
Glucose, Bld: 103 mg/dL — ABNORMAL HIGH (ref 70–99)
Potassium: 4.2 mmol/L (ref 3.5–5.1)
Sodium: 155 mmol/L — ABNORMAL HIGH (ref 135–145)
Total Bilirubin: 2.3 mg/dL — ABNORMAL HIGH (ref 0.3–1.2)
Total Protein: 8.6 g/dL — ABNORMAL HIGH (ref 6.5–8.1)

## 2022-08-05 LAB — GLUCOSE, CAPILLARY
Glucose-Capillary: 105 mg/dL — ABNORMAL HIGH (ref 70–99)
Glucose-Capillary: 109 mg/dL — ABNORMAL HIGH (ref 70–99)
Glucose-Capillary: 114 mg/dL — ABNORMAL HIGH (ref 70–99)
Glucose-Capillary: 117 mg/dL — ABNORMAL HIGH (ref 70–99)
Glucose-Capillary: 120 mg/dL — ABNORMAL HIGH (ref 70–99)
Glucose-Capillary: 122 mg/dL — ABNORMAL HIGH (ref 70–99)
Glucose-Capillary: 124 mg/dL — ABNORMAL HIGH (ref 70–99)
Glucose-Capillary: 181 mg/dL — ABNORMAL HIGH (ref 70–99)

## 2022-08-05 LAB — CBC
HCT: 34.2 % — ABNORMAL LOW (ref 36.0–46.0)
Hemoglobin: 9.1 g/dL — ABNORMAL LOW (ref 12.0–15.0)
MCH: 27.3 pg (ref 26.0–34.0)
MCHC: 26.6 g/dL — ABNORMAL LOW (ref 30.0–36.0)
MCV: 102.7 fL — ABNORMAL HIGH (ref 80.0–100.0)
Platelets: 81 10*3/uL — ABNORMAL LOW (ref 150–400)
RBC: 3.33 MIL/uL — ABNORMAL LOW (ref 3.87–5.11)
RDW: 22.4 % — ABNORMAL HIGH (ref 11.5–15.5)
WBC: 4.5 10*3/uL (ref 4.0–10.5)
nRBC: 0 % (ref 0.0–0.2)

## 2022-08-05 LAB — PHOSPHORUS
Phosphorus: 4.1 mg/dL (ref 2.5–4.6)
Phosphorus: 3.2 mg/dL (ref 2.5–4.6)

## 2022-08-05 LAB — MAGNESIUM
Magnesium: 1.9 mg/dL (ref 1.7–2.4)
Magnesium: 1.6 mg/dL — ABNORMAL LOW (ref 1.7–2.4)

## 2022-08-05 LAB — SODIUM
Sodium: 155 mmol/L — ABNORMAL HIGH (ref 135–145)
Sodium: 147 mmol/L — ABNORMAL HIGH (ref 135–145)

## 2022-08-05 MED ORDER — LACTULOSE 10 GM/15ML PO SOLN
30.0000 g | Freq: Three times a day (TID) | ORAL | Status: DC
  Administered 2022-08-05 – 2022-08-11 (×19): 30 g
  Filled 2022-08-05 (×2): qty 60
  Filled 2022-08-05: qty 45
  Filled 2022-08-05: qty 60
  Filled 2022-08-05: qty 45
  Filled 2022-08-05 (×2): qty 60
  Filled 2022-08-05 (×3): qty 45
  Filled 2022-08-05: qty 60
  Filled 2022-08-05 (×3): qty 45
  Filled 2022-08-05: qty 60
  Filled 2022-08-05 (×2): qty 45

## 2022-08-05 MED ORDER — POTASSIUM CL IN DEXTROSE 5% 20 MEQ/L IV SOLN
20.0000 meq | INTRAVENOUS | Status: DC
  Administered 2022-08-05 – 2022-08-06 (×3): 20 meq via INTRAVENOUS
  Filled 2022-08-05 (×3): qty 1000

## 2022-08-05 MED ORDER — DEXTROSE-NACL 5-0.9 % IV SOLN: INTRAVENOUS | Status: DC

## 2022-08-05 MED ORDER — POTASSIUM CHLORIDE 20 MEQ PO PACK: 40.0000 meq | PACK | Freq: Every day | ORAL | Status: DC

## 2022-08-05 MED ORDER — POTASSIUM CHLORIDE 20 MEQ PO PACK: 40.0000 meq | PACK | Freq: Two times a day (BID) | ORAL | Status: DC

## 2022-08-05 MED ORDER — QUETIAPINE FUMARATE 50 MG PO TABS
25.0000 mg | ORAL_TABLET | Freq: Two times a day (BID) | ORAL | Status: DC
  Administered 2022-08-05 – 2022-08-08 (×7): 25 mg via ORAL
  Filled 2022-08-05 (×7): qty 1

## 2022-08-05 NOTE — Progress Notes (Signed)
RT changed out the patient's bite block in mouth to keep the tongue in place. RT will continue to monitor

## 2022-08-05 NOTE — Progress Notes (Signed)
eLink Physician-Brief Progress Note Patient Name: Joann [name] DOB: 1981-05-25 MRN: [id number]   Date of Service  08/05/2022  HPI/Events of Note  Have done camera assessment x 2 over last couple hours . Day time CCM MD had alerted [place] of self extubation right around shift change.   eICU Interventions  Both times, patient has been laying in bed without overt distress. Does have mitts on. Mild tachypnea RR 18-19 o2 sat is 100 on nasal oxygen. Continue close monitoring. High risk of needing reintubation.      Intervention Category Major Interventions: Respiratory failure - evaluation and management  [name] 08/05/2022, 9:04 PM

## 2022-08-05 NOTE — Procedures (Signed)
Extubation Procedure Note  Patient Details:   Name: Joann [name] DOB: [birth year] MRN: [id number]   Airway Documentation:  Airway 7 mm (Active)  Secured at (cm) 20 cm 08/05/22 1500  Measured From Lips 08/05/22 [place] 08/05/22 1500  Secured By [employer] 08/05/22 1500  Tube Holder Repositioned Yes 08/05/22 1150  Prone position No 08/05/22 1500  Head position Right 08/05/22 1500  Cuff Pressure (cm H2O) Clear OR 27-39 CmH2O 08/05/22 1500  Site Condition Cool;Dry 08/05/22 1500   Vent end date: 08/05/22 Vent end time: 1635   Evaluation  O2 sats: stable throughout Complications: No apparent complications Patient did not tolerate procedure well. Bilateral Breath Sounds: Clear, Diminished   No Patient had pulled ET tube out. Patient placed on 10L [place] and appears to be tolerating at this time. RT will continue to monitor  [name] 08/05/2022, 6:39 PM

## 2022-08-05 NOTE — Progress Notes (Signed)
Gastroenterology Inpatient Follow-up Note   PATIENT IDENTIFICATION  Joann [name] is a 41 y.o. female with a pmh significant for alcoholic cirrhosis (complicated by portal hypertension manifested as EVs, PHG, PSE), severe alcohol withdrawals/seizures/DTs, hypertension.  Admitted to the hospital with recurrent alcohol withdrawal esophageal variceal banding persistent difficulty weaning severe alcohol withdrawal. Hospital Day: 12  SUBJECTIVE  The patient's chart has been reviewed. The patient's labs have been reviewed. Patient seems to be a little bit more awake though not completely following commands today but seems to be better than a couple days ago when I had last seen her. Electrolytes show significant hypernatremia today more so than yesterday.   OBJECTIVE  Scheduled Inpatient Medications:   Chlorhexidine Gluconate Cloth  6 each Topical Daily   clonazePAM  0.5 mg Oral BID   feeding supplement (PROSource TF20)  60 mL Per Tube Daily   folic acid  1 mg Per Tube Daily   free water  300 mL Per Tube Q4H   insulin aspart  0-9 Units Subcutaneous Q4H   lactulose  30 g Per Tube QID   mouth rinse  15 mL Mouth Rinse Q2H   pantoprazole (PROTONIX) IV  40 mg Intravenous Q12H   QUEtiapine  25 mg Oral BID   rifaximin  550 mg Per Tube BID   zinc sulfate  220 mg Oral BID   Continuous Inpatient Infusions:   sodium chloride 10 mL/hr (08/01/22 0615)   dexmedetomidine (PRECEDEX) IV infusion Stopped (08/04/22 1441)   dextrose 5 % with KCl 20 mEq / L 20 mEq (08/05/22 1001)   feeding supplement (VITAL 1.5 CAL) 1,000 mL (08/05/22 0954)   PRN Inpatient Medications: sodium chloride, fentaNYL (SUBLIMAZE) injection, LORazepam, mouth rinse   Physical Examination  Temp:  [97.2 F (36.2 C)-101.1 F (38.4 C)] 101.1 F (38.4 C) (11/26 0800) Pulse Rate:  [82-133] 133 (11/26 0800) Resp:  [12-23] 21 (11/26 0800) BP: (78-154)/(44-78) 139/76 (11/26 0800) SpO2:  [97 %-100 %] 100 % (11/26 0800) FiO2 (%):   [30 %] 30 % (11/26 0800) Temp (24hrs), Avg:99 F (37.2 C), Min:97.2 F (36.2 C), Max:101.1 F (38.4 C)  Weight: 69.5 kg GEN: Intubated, not sedated PSYCH: Unable to assess EYE: Icteric sclerae ENT: MMM, core track tube in place CV: Tachycardic RESP: Ventilatory breath sounds present GI: NABS, protuberant abdomen, rounded MSK/EXT: Lower extremity edema present SKIN: No overt jaundice NEURO: Minimal clonus noted in lower extremities   Review of Data   Laboratory Studies   Recent Labs  Lab 08/04/22 0242 08/04/22 1623 08/05/22 0302  NA 150* 149* 155*  K 3.8  --  4.2  CL 122*  --  127*  CO2 23  --  23  BUN 6  --  8  CREATININE 0.43*  --  0.41*  GLUCOSE 117*  --  103*  CALCIUM 8.6*  --  8.9  MG 1.8 2.3 1.9  PHOS 3.6 3.8 4.1   Recent Labs  Lab 08/05/22 0302  AST 57*  ALT 8  ALKPHOS 105    Recent Labs  Lab 08/02/22 0400 08/03/22 0557 08/05/22 0302  WBC 4.8 5.0 4.5  HGB 9.1* 9.2* 9.1*  HCT 31.7* 32.9* 34.2*  PLT 91* 100* 81*   Recent Labs  Lab 07/30/22 0522  INR 1.9*   MELD 3.0: 21 at 08/01/2022  5:02 AM MELD-Na: 19 at 08/01/2022  5:02 AM Calculated from: Serum Creatinine: 0.41 mg/dL (Using min of 1 mg/dL) at 08/01/2022  5:02 AM Serum Sodium: 139 mmol/L (  Using max of 137 mmol/L) at 08/01/2022  5:02 AM Total Bilirubin: 3.7 mg/dL at 07/31/2022  4:42 AM Serum Albumin: 2.3 g/dL at 07/31/2022  4:42 AM INR(ratio): 1.9 at 07/30/2022  5:22 AM Age at listing (hypothetical): 89 years Sex: Female at 08/01/2022  5:02 AM  Imaging Studies  No results found.  GI Procedures and Studies  No new relevant studies to review   ASSESSMENT  Ms. Thies is a 41 y.o. female with a pmh significant for alcoholic cirrhosis (complicated by portal hypertension manifested as EVs, PHG, PSE), severe alcohol withdrawals/seizures/DTs, hypertension.  Admitted to the hospital with recurrent alcohol withdrawal esophageal variceal banding persistent difficulty weaning severe alcohol  withdrawal.  The patient is hemodynamically stable.  Thankfully it looks like she is slightly more alert with potential plans in the next 24 hours for hopeful extubation by the primary team if possible.  Her hyponatremia could be contributing somewhat to some of her mentation issues and so agree with aggressive free water replacement as per the ICU service.  I am going to cut back her lactulose from 4 times daily to 3 times daily in an effort of trying to decrease her diarrhea which may help with her hyponatremia as well.  Even though the patient's MELD is low at this time at 1, she is a nontransplant candidacy patient who has had multiple relapses.  Her 7-monthmortality is approximately 25%.  If she continues to have recurrent bouts of alcoholic hepatitis and alcohol withdrawal she will be in worse shape moving forward.  Patient's status remains guarded.   PLAN/RECOMMENDATIONS  Continue lactulose at 3 times daily dosing Continue Xifaxan twice daily Continue zinc 220 mg twice daily Sodium benzoate has been used in the past and may need to be considered if aggressive ammonia levels do not improve Continue PPI 40 twice daily IV for now Titrate feeding as able agree Free water deficit should be replaced as per ICU service   The inpatient GI team of  will continue to follow, Dr. GLyndel Safetakes over tomorrow.  Please page/call with questions or concerns.   GJustice Britain MD [facility] Office # [phone]   LOS: 11 days  GIrving Copas 08/05/2022, 11:03 AM

## 2022-08-05 NOTE — Progress Notes (Signed)
NAME:  Joann [name], MRN:  [id number], DOB:  [birth year], LOS: 16 ADMISSION DATE:  07/25/2022, CONSULTATION DATE:  07/25/2022 REFERRING MD:  Dr. Ralene Bathe, CHIEF COMPLAINT:  hematemesis    History of Present Illness:  41 year old female with PMH significant for current ETOH abuse, ETOH cirrhosis, DTs w/seizures, hepatic encephalopathy, HTN, and anxiety presenting to [facility] ER with complaints of nausea with multiple episodes of hematemesis since Tuesday morning with RUQ abdominal, weakness, unable to eat in three days, and several days of dark stools. Drinks a gallon of vodka daily, last drink was 11/14.  Hx of seizures when she tries to quit.  Multiple episodes of small dark bloody emesis in ER.  Hgb 9.7, INR 1.6, plts 76.   Admitted to PCCM.  Given FFP, PRBC, and txa.  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    Pertinent  Medical History  ETOH abuse, ETOH cirrhosis with grade 1 esophageal varices, portal HTN gastropathy, DTs w/seizures, hepatic encephalopathy, HTN, anxiety  Previously f/b [facility] Hospital Events: Including procedures, antibiotic start and stop dates in addition to other pertinent events   11/15 Admitted hematemesis, intubated/ EGD> esophageal varices s/p 6 bands, multiple blood products/ txa/ vit k 11/16 very hard to sedate then progressively hypotensive despite decreasing sedation> CTH neg, EEG/ cerebell neg, multiple blood products for coagulapathy/ txa/ vit k, tongue biting/ clenching 11/21 Has been minimally alert until today with intermittent following of commands per overnight RN 11/25 Encephalopathic but now moving extremities, not tracking 11/26   Interim History / Subjective:   Intermittently agitated, intermittently following commands  Objective   Blood pressure 139/76, pulse (!) 133, temperature (!) 101.1 F (38.4 C), temperature  source Axillary, resp. rate (!) 21, height 5' 4"  (1.626 m), weight 69.5 kg, SpO2 100 %.    Vent Mode: PSV;CPAP FiO2 (%):  [30 %] 30 % Set Rate:  [12 bmp-13 bmp] 13 bmp Vt Set:  [430 mL] 430 mL PEEP:  [5 cmH20] 5 cmH20 Pressure Support:  [10 cmH20] 10 cmH20 Plateau Pressure:  [12 cmH20-19 cmH20] 17 cmH20   Intake/Output Summary (Last 24 hours) at 08/05/2022 0900 Last data filed at 08/05/2022 0800 Gross per 24 hour  Intake 1537.08 ml  Output 1800 ml  Net -262.92 ml   Filed Weights   08/01/22 0500 08/02/22 0500 08/03/22 0500  Weight: 73.3 kg 73.3 kg 69.5 kg   Physical Exam: General: Chronically ill-appearing, intermittently agitated, encephalopathic HENT: [place], AT, OP clear, MMM Eyes: EOMI, no scleral icterus Respiratory: Clear to auscultation bilaterally.  No crackles, wheezing or rales Cardiovascular: RRR, -M/R/G, no JVD GI: BS+, soft, nontender Extremities:-Edema,-tenderness Neuro: Opens eyes, moves extremities x 4, intermittently follows commands, grimaces to noxious stimuli GU: External foley in place   Na 155 K 3.8 Phos 4.1 Mag 1.9  Ammonia 147>119>109  Resolved Hospital Problem list   Hemorrhagic shock +/- septic shock. Sepsis ruled out Stap epi contaminant in blood culture  Assessment & Plan:   Acute metabolic encephalopathy - agitated but no purposeful activity. Difficult to assess with intermittent sedation. Improving Hepatic encephalopathy Hyperammonemia - improving Hypoactive delirium CT head NAICA. EEG 11/17 neg - Minimize sedating medications - Continue lactulose TID and rifaximin - Seroquel BID  Acute hypoxic respiratory failure Intubated for EGD 11/15 and left on MV overnight to monitor for rebleeding of banded varices  - Yesterday tolerated PS. Today  too agitated. Extubation precluded by mental status. Day 11 on vent - SBT/WUA daily - Full vent support. VAP bundle - wean FiO2 as able for SpO2 >92%  - Consider trial extubation if mental status  clears  Upper GIB 2/2 bleeding grade III esophageal varices s/p banding, portal hypertensive gastropathy and duodenum lesion s/p APC and clip ETOH cirrhosis ETOH abuse, ongoing, 1 gallon of vodka daily, last drink 11/14 Portal hypertensive gastropathy  Hepatic encephalopathy  - discriminant score 26.3, steroids deferred at admission - EGD 11/15 by Dr. 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 s/p APC and clip  (prior EGD 06/26/22 showed one single column of grade I esophageal varices  - Completed SBP ppx Plan:  - trend CBC - IV PPI BID - recs for repeat EGD in 3-4 weeks - continue lactulose and rifaximin  Acute on chronic Anemia - stable Coagulapathy - acute on chronic thrombocytopenia, elevated INR - 2 units FFP now with TXA f/b 2 units PRBC 11/15 - 1 FFP, 1 platelets, 1 cryo, txa, vit K 11/16 - 1 FFP 11/17 Thrombocytopenia  P:  - trend cbc daily  Hypernatremia - 11/25 Hypokalemia - improving Hypocalcemia  Hypophos  Hypomagnesia  - correct electrolytes - Trend K and Phos, Mg - Replete - FWF 300 q4h - Add D5 1/2 with KCL - F/u PM Na  Hypoglycemia - improved on TF Likely related to liver dysfunction, critically illness, and NPO - TF - CBGs q4hr  PAF HTN - hold home coreg   - had brief episode of afib 11/15.  Remains in [place] contraindicated, and likely just related to critical illness  - remains NSR/ ST on telemetry.  Continue to monitor  - keep K> 4, Mag > 2  Urinary retention - strict I/Os - Trial off foley 11/24 - Monitor UOP  Moderate Malnutrition related to chronic illness - appreciate RD consult - Monitor for refeeding syndrome - Monitor BID lytes  High risk for ETOH withdrawal, hx of Dts ETOH abuse - daily 1 gallon of vodka, last drink 11/14 - stopped phenobarb taper 11/18 given poor responsiveness off continuous  sedation all day - high dose thiamine x 2 days then 159m daily (started 11/16) - cont thiamine, folate IV - prn ativan q 2 - seizure precautions - will need TOC closer to discharge for options/ resources> ?inpt rehab facility  Best Practice (right click and "Reselect all SmartList Selections" daily)   Diet/type: tubefeeds DVT prophylaxis: SCD GI prophylaxis: PPI Lines: Central line Foley:  Yes, and it is still needed Code Status:  full code Last date of multidisciplinary goals of care discussion:  Husband, TCelesta [phone](truck driver). Advises if he can not answer phone to leave a message and he would call back.   Husband, TCelesta Gentileupdated by phone and bedside on 11/22  Critical care time: 40 mins    The patient is critically ill with multiple organ systems failure and requires high complexity decision making for assessment and support, frequent evaluation and titration of therapies, application of advanced monitoring technologies and extensive interpretation of multiple databases.    JRodman Pickle M.D. LGrand Gi And Endoscopy Group IncPulmonary/Critical Care Medicine 08/05/2022 9:00 AM   Please see Amion for pager number to reach on-call Pulmonary and Critical Care Team.

## 2022-08-05 NOTE — Progress Notes (Signed)
Upon entering room, this RN assessed ETT at 17cm from previous 20cm.  MD at bedside, RT called.  MD gave order to remove ETT.  RT assisted RN removing ETT.  Pt responding to verbal stimulation, VSS.  Justice Britain, RN 08/05/2022

## 2022-08-05 NOTE — Progress Notes (Addendum)
PCCM Progress Note  Patient self-extubated. Placed on [place] with SpO2 100%. Patient has been passing SBT daily except for today due to agitation. Remains encephalopathic but appearing to protect airway for now. Not following commands but able to tell us her name and demand water. Will need close monitoring. High risk for re-intubation if unable to protect airway.

## 2022-08-06 ENCOUNTER — Inpatient Hospital Stay (HOSPITAL_COMMUNITY): Payer: Medicaid Other

## 2022-08-06 DIAGNOSIS — K922 Gastrointestinal hemorrhage, unspecified: Secondary | ICD-10-CM | POA: Diagnosis not present

## 2022-08-06 DIAGNOSIS — K7030 Alcoholic cirrhosis of liver without ascites: Secondary | ICD-10-CM | POA: Diagnosis not present

## 2022-08-06 LAB — COMPREHENSIVE METABOLIC PANEL
ALT: 9 U/L (ref 0–44)
AST: 61 U/L — ABNORMAL HIGH (ref 15–41)
Albumin: 2.7 g/dL — ABNORMAL LOW (ref 3.5–5.0)
Alkaline Phosphatase: 96 U/L (ref 38–126)
Anion gap: 8 (ref 5–15)
BUN: 6 mg/dL (ref 6–20)
CO2: 24 mmol/L (ref 22–32)
Calcium: 8.6 mg/dL — ABNORMAL LOW (ref 8.9–10.3)
Chloride: 116 mmol/L — ABNORMAL HIGH (ref 98–111)
Creatinine, Ser: <0.3 mg/dL — ABNORMAL LOW (ref 0.44–1.00)
Glucose, Bld: 128 mg/dL — ABNORMAL HIGH (ref 70–99)
Potassium: 4 mmol/L (ref 3.5–5.1)
Sodium: 148 mmol/L — ABNORMAL HIGH (ref 135–145)
Total Bilirubin: 2.1 mg/dL — ABNORMAL HIGH (ref 0.3–1.2)
Total Protein: 8.5 g/dL — ABNORMAL HIGH (ref 6.5–8.1)

## 2022-08-06 LAB — GLUCOSE, CAPILLARY
Glucose-Capillary: 113 mg/dL — ABNORMAL HIGH (ref 70–99)
Glucose-Capillary: 118 mg/dL — ABNORMAL HIGH (ref 70–99)
Glucose-Capillary: 124 mg/dL — ABNORMAL HIGH (ref 70–99)
Glucose-Capillary: 126 mg/dL — ABNORMAL HIGH (ref 70–99)
Glucose-Capillary: 127 mg/dL — ABNORMAL HIGH (ref 70–99)
Glucose-Capillary: 81 mg/dL (ref 70–99)

## 2022-08-06 LAB — CBC
HCT: 32.5 % — ABNORMAL LOW (ref 36.0–46.0)
Hemoglobin: 8.9 g/dL — ABNORMAL LOW (ref 12.0–15.0)
MCH: 27.5 pg (ref 26.0–34.0)
MCHC: 27.4 g/dL — ABNORMAL LOW (ref 30.0–36.0)
MCV: 100.3 fL — ABNORMAL HIGH (ref 80.0–100.0)
Platelets: 69 10*3/uL — ABNORMAL LOW (ref 150–400)
RBC: 3.24 MIL/uL — ABNORMAL LOW (ref 3.87–5.11)
RDW: 21.8 % — ABNORMAL HIGH (ref 11.5–15.5)
WBC: 4.5 10*3/uL (ref 4.0–10.5)
nRBC: 0 % (ref 0.0–0.2)

## 2022-08-06 LAB — PHOSPHORUS: Phosphorus: 2.9 mg/dL (ref 2.5–4.6)

## 2022-08-06 LAB — MAGNESIUM: Magnesium: 1.6 mg/dL — ABNORMAL LOW (ref 1.7–2.4)

## 2022-08-06 LAB — AMMONIA: Ammonia: 72 umol/L — ABNORMAL HIGH (ref 9–35)

## 2022-08-06 MED ORDER — ACETAMINOPHEN 650 MG RE SUPP: 650.0000 mg | Freq: Three times a day (TID) | RECTAL | Status: DC | PRN

## 2022-08-06 MED ORDER — LORAZEPAM 2 MG/ML IJ SOLN
INTRAMUSCULAR | Status: AC
  Administered 2022-08-06: 2 mg via INTRAVENOUS
  Filled 2022-08-06: qty 1

## 2022-08-06 MED ORDER — LORAZEPAM 2 MG/ML IJ SOLN
2.0000 mg | INTRAMUSCULAR | Status: DC | PRN
  Administered 2022-08-06 – 2022-08-08 (×3): 2 mg via INTRAVENOUS
  Filled 2022-08-06 (×3): qty 1

## 2022-08-06 MED ORDER — MAGNESIUM SULFATE 4 GM/100ML IV SOLN
4.0000 g | Freq: Once | INTRAVENOUS | Status: AC
  Administered 2022-08-06: 4 g via INTRAVENOUS
  Filled 2022-08-06: qty 100

## 2022-08-06 MED ORDER — ACETAMINOPHEN 160 MG/5ML PO SOLN
650.0000 mg | Freq: Three times a day (TID) | ORAL | Status: DC | PRN
  Administered 2022-08-06: 650 mg
  Filled 2022-08-06: qty 20.3

## 2022-08-06 MED ORDER — CLONAZEPAM 0.5 MG PO TABS
0.5000 mg | ORAL_TABLET | Freq: Two times a day (BID) | ORAL | Status: DC
  Administered 2022-08-06 – 2022-08-10 (×9): 0.5 mg
  Filled 2022-08-06 (×9): qty 1

## 2022-08-06 NOTE — Telephone Encounter (Signed)
The pt is still currently admitted-will make appt if pt is discharged.

## 2022-08-06 NOTE — Progress Notes (Signed)
OT Cancellation Note  Patient Details Name: Joann [name] MRN: [id number] DOB: Jan 13, 1981   Cancelled Treatment:    Reason Eval/Treat Not Completed: Patient's level of consciousness OT to continue to follow and check back on 11/28.  Rennie Plowman, MS Acute Rehabilitation Department Office# [phone]  08/06/2022, 2:15 PM

## 2022-08-06 NOTE — Progress Notes (Signed)
NAME:  Joann [name], MRN:  [id number], DOB:  Jan 29, 1981, LOS: 12 ADMISSION DATE:  07/25/2022, CONSULTATION DATE:  07/25/2022 REFERRING MD:  Dr. Ralene Bathe, CHIEF COMPLAINT:  hematemesis    History of Present Illness:  41 year old female with PMH significant for current ETOH abuse, ETOH cirrhosis, DTs w/seizures, hepatic encephalopathy, HTN, and anxiety presenting to [facility] ER with complaints of nausea with multiple episodes of hematemesis since Tuesday morning with RUQ abdominal, weakness, unable to eat in three days, and several days of dark stools. Drinks a gallon of vodka daily, last drink was 11/14.  Hx of seizures when she tries to quit.  Multiple episodes of small dark bloody emesis in ER.  Hgb 9.7, INR 1.6, plts 76.   Admitted to PCCM.  Given FFP, PRBC, and txa.  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    Pertinent  Medical History  ETOH abuse, ETOH cirrhosis with grade 1 esophageal varices, portal HTN gastropathy, DTs w/seizures, hepatic encephalopathy, HTN, anxiety  Previously f/b [facility] Events: Including procedures, antibiotic start and stop dates in addition to other pertinent events   11/15 Admitted hematemesis, intubated/ EGD> esophageal varices s/p 6 bands, multiple blood products/ txa/ vit k 11/16 very hard to sedate then progressively hypotensive despite decreasing sedation> CTH neg, EEG/ cerebell neg, multiple blood products for coagulapathy/ txa/ vit k, tongue biting/ clenching 11/21 Has been minimally alert until today with intermittent following of commands per overnight RN 11/25 Encephalopathic but now moving extremities, not tracking 11/26 self extubated  Interim History / Subjective:  Laying sideways in bed with mitten on.  Objective   Blood pressure 117/76, pulse (!) 109, temperature 98.1 F (36.7 C), temperature source Oral,  resp. rate 19, height 5' 4"  (1.626 m), weight 69.5 kg, SpO2 99 %.    Vent Mode: PRVC FiO2 (%):  [30 %] 30 % Set Rate:  [12 bmp] 12 bmp Vt Set:  [430 mL] 430 mL PEEP:  [5 cmH20] 5 cmH20 Plateau Pressure:  [15 cmH20-17 cmH20] 17 cmH20   Intake/Output Summary (Last 24 hours) at 08/06/2022 0748 Last data filed at 08/05/2022 9449 Gross per 24 hour  Intake 2500.32 ml  Output --  Net 2500.32 ml    Filed Weights   08/01/22 0500 08/02/22 0500 08/03/22 0500  Weight: 73.3 kg 73.3 kg 69.5 kg   Physical Exam: Chronically ill appearing woman laying in bed Lungs clear anteriorly Heart sounds tachy Ext warm Abd soft NGT in place Purewick has been removed by patient Soiled Diaper  Off precedex Sodium up, K okay, mag low  Resolved Hospital Problem list   Hemorrhagic shock +/- septic shock. Sepsis ruled out Stap epi contaminant in blood culture  Assessment & Plan:   Hepatic encephalopathy and ICU delirium CT head NAICA. EEG 11/17 neg - Continue lactulose TID and rifaximin - Seroquel BID - Precedex if needed for agitation, stopped this am  Acute hypoxic respiratory failure Intubated for EGD 11/15 and left on MV overnight to monitor for rebleeding of banded varices.  Self extubated 11/26 evening. - PT/OT/SLP consults appreciated - Wean O2 for sats > 90%  Upper GIB 2/2 bleeding grade III esophageal varices s/p banding, portal hypertensive gastropathy and duodenum lesion s/p APC and clip ETOH cirrhosis ETOH abuse, ongoing, 1 gallon of vodka daily, last drink 11/14 Portal hypertensive gastropathy  Hepatic encephalopathy  - discriminant score  26.3, steroids deferred at admission - EGD 11/15 by Dr. 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 s/p APC and clip  (prior EGD 06/26/22 showed one single column of grade I esophageal varices  - Completed SBP  ppx Plan:  - trend CBC - IV PPI BID - recs for repeat EGD in 3-4 weeks - continue lactulose and rifaximin  Acute on chronic Anemia - stable Coagulapathy - acute on chronic thrombocytopenia, elevated INR - 2 units FFP now with TXA f/b 2 units PRBC 11/15 - 1 FFP, 1 platelets, 1 cryo, txa, vit K 11/16 - 1 FFP 11/17 Thrombocytopenia - sequestration from advanced liver disease P:  - trend cbc daily  Hypernatremia - 11/25 Hypocalcemia  Hypophos  Hypomagnesia  - Continue FWF per tube - Replete K to 4, Mg to 2, and phos to 2.8ish  Hypoglycemia - improved on TF Likely related to liver dysfunction, critically illness, and NPO - TF - CBGs q4hr  PAF- transient related to critical illness; not AC candidate HTN - home meds on hold  Urinary retention - strict I/Os - Trial off foley 11/24 - Monitor UOP  Moderate Malnutrition related to chronic illness - appreciate RD consult - Monitor for refeeding syndrome  High risk for ETOH withdrawal, hx of Dts ETOH abuse- now out of window - TOC c/s for outpatient resources, not much more we can do with this  Best Practice (right click and "Reselect all SmartList Selections" daily)   Diet/type: tubefeeds DVT prophylaxis: SCD GI prophylaxis: PPI Lines: Central line Foley:  Yes, and it is still needed Code Status:  full code Last date of multidisciplinary goals of care discussion:  Husband, Celesta Gentile [phone] (truck driver). Advises if he can not answer phone to leave a message and he would call back.   Husband, Celesta Gentile updated by phone and bedside on 11/22   33 min cc time: airway watch, precedex titration  [place] 08/06/2022 7:48 AM   Please see Amion for pager number to reach on-call Pulmonary and Critical Care Team.

## 2022-08-06 NOTE — Progress Notes (Signed)
  Transition of Care [facility]) Screening Note   Patient Details  Name: Joann [name] Date of Birth: 06/10/1981   Transition of Care [facility]) CM/SW Contact:    Roseanne Kaufman, RN Phone Number: 08/06/2022, 5:56 PM    Transition of Care Department [facility]) has reviewed patient and no TOC needs have been identified at this time. We will continue to monitor patient advancement through interdisciplinary progression rounds. If new patient transition needs arise, please place a TOC consult.

## 2022-08-06 NOTE — Progress Notes (Signed)
SLP Cancellation Note  Patient Details Name: Joann [name] MRN: [id number] DOB: 1981/06/26   Cancelled treatment:       Reason Eval/Treat Not Completed: Patient's level of consciousness. Patient in bed, eyes closed, mumbling to herself. Per NT, she has not been alert or able to follow commands. SLP will f/u for readiness to evaluate swallow/PO readiness.   Sonia Baller, MA, CCC-SLP Speech Therapy

## 2022-08-06 NOTE — Progress Notes (Signed)
[place] Progress Note Patient Name: Joann [name] DOB: September 09, 1981 MRN: [id number]   Date of Service  08/06/2022  HPI/Events of Note  Notified of temp 100.4, request for prn antipyretics Not on antibiotics There was concern for aspiration earlier More tachycardic as per bedside RN  eICU Interventions  Ordered Tylenol per OGT not to exceed 2 g in 24 hrs CXR ordered and assess need for antibiotics        Shona Needles Mykal Kirchman 08/06/2022, 8:45 PM

## 2022-08-06 NOTE — Progress Notes (Signed)
PT Cancellation Note  Patient Details Name: Joann [name] MRN: [id number] DOB: [birth year]   Cancelled Treatment:    Reason Eval/Treat Not Completed: (P) Patient's level of consciousness. Per RN pt is not following commands and is not alert to safely mobilize. Pt observed to be in bed, mittens in place, eyes closed, mumbling to herself with visitor present. We will check back as schedule and pt status allows.   Coolidge Breeze, PT, DPT [place] [facility] Department Office: [phone] Weekend pager: [phone]  Coolidge Breeze 08/06/2022, 10:21 AM

## 2022-08-07 ENCOUNTER — Inpatient Hospital Stay: Payer: Self-pay

## 2022-08-07 DIAGNOSIS — K922 Gastrointestinal hemorrhage, unspecified: Secondary | ICD-10-CM | POA: Diagnosis not present

## 2022-08-07 LAB — CBC WITH DIFFERENTIAL/PLATELET
Abs Immature Granulocytes: 0.01 10*3/uL (ref 0.00–0.07)
Basophils Absolute: 0 10*3/uL (ref 0.0–0.1)
Basophils Relative: 0 %
Eosinophils Absolute: 0.1 10*3/uL (ref 0.0–0.5)
Eosinophils Relative: 1 %
HCT: 28.3 % — ABNORMAL LOW (ref 36.0–46.0)
Hemoglobin: 7.9 g/dL — ABNORMAL LOW (ref 12.0–15.0)
Immature Granulocytes: 0 %
Lymphocytes Relative: 31 %
Lymphs Abs: 1.1 10*3/uL (ref 0.7–4.0)
MCH: 27.6 pg (ref 26.0–34.0)
MCHC: 27.9 g/dL — ABNORMAL LOW (ref 30.0–36.0)
MCV: 99 fL (ref 80.0–100.0)
Monocytes Absolute: 0.3 10*3/uL (ref 0.1–1.0)
Monocytes Relative: 7 %
Neutro Abs: 2.2 10*3/uL (ref 1.7–7.7)
Neutrophils Relative %: 61 %
Platelets: 76 10*3/uL — ABNORMAL LOW (ref 150–400)
RBC: 2.86 MIL/uL — ABNORMAL LOW (ref 3.87–5.11)
RDW: 20.9 % — ABNORMAL HIGH (ref 11.5–15.5)
WBC: 3.7 10*3/uL — ABNORMAL LOW (ref 4.0–10.5)
nRBC: 0 % (ref 0.0–0.2)

## 2022-08-07 LAB — CBC
HCT: 25.4 % — ABNORMAL LOW (ref 36.0–46.0)
Hemoglobin: 7 g/dL — ABNORMAL LOW (ref 12.0–15.0)
MCH: 27.5 pg (ref 26.0–34.0)
MCHC: 27.6 g/dL — ABNORMAL LOW (ref 30.0–36.0)
MCV: 99.6 fL (ref 80.0–100.0)
Platelets: 73 10*3/uL — ABNORMAL LOW (ref 150–400)
RBC: 2.55 MIL/uL — ABNORMAL LOW (ref 3.87–5.11)
RDW: 20.6 % — ABNORMAL HIGH (ref 11.5–15.5)
WBC: 4.5 10*3/uL (ref 4.0–10.5)
nRBC: 0 % (ref 0.0–0.2)

## 2022-08-07 LAB — GLUCOSE, CAPILLARY
Glucose-Capillary: 111 mg/dL — ABNORMAL HIGH (ref 70–99)
Glucose-Capillary: 95 mg/dL (ref 70–99)
Glucose-Capillary: 95 mg/dL (ref 70–99)
Glucose-Capillary: 111 mg/dL — ABNORMAL HIGH (ref 70–99)
Glucose-Capillary: 87 mg/dL (ref 70–99)
Glucose-Capillary: 102 mg/dL — ABNORMAL HIGH (ref 70–99)

## 2022-08-07 LAB — HEMOGLOBIN AND HEMATOCRIT, BLOOD
HCT: 25.8 % — ABNORMAL LOW (ref 36.0–46.0)
Hemoglobin: 7.1 g/dL — ABNORMAL LOW (ref 12.0–15.0)

## 2022-08-07 LAB — PREPARE RBC (CROSSMATCH)

## 2022-08-07 LAB — BASIC METABOLIC PANEL
Anion gap: 4 — ABNORMAL LOW (ref 5–15)
BUN: 12 mg/dL (ref 6–20)
CO2: 27 mmol/L (ref 22–32)
Calcium: 8.3 mg/dL — ABNORMAL LOW (ref 8.9–10.3)
Chloride: 113 mmol/L — ABNORMAL HIGH (ref 98–111)
Creatinine, Ser: 0.49 mg/dL (ref 0.44–1.00)
GFR, Estimated: >60 mL/min (ref >60)
Glucose, Bld: 122 mg/dL — ABNORMAL HIGH (ref 70–99)
Potassium: 4.1 mmol/L (ref 3.5–5.1)
Sodium: 144 mmol/L (ref 135–145)

## 2022-08-07 LAB — MAGNESIUM: Magnesium: 2 mg/dL (ref 1.7–2.4)

## 2022-08-07 LAB — PHOSPHORUS: Phosphorus: 3.4 mg/dL (ref 2.5–4.6)

## 2022-08-07 MED ORDER — SODIUM CHLORIDE 0.9% FLUSH
10.0000 mL | Freq: Two times a day (BID) | INTRAVENOUS | Status: DC
  Administered 2022-08-07 – 2022-08-13 (×10): 10 mL

## 2022-08-07 MED ORDER — ALBUMIN HUMAN 25 % IV SOLN
12.5000 g | Freq: Once | INTRAVENOUS | Status: AC
  Administered 2022-08-07: 12.5 g via INTRAVENOUS
  Filled 2022-08-07: qty 50

## 2022-08-07 MED ORDER — SODIUM CHLORIDE 0.9% FLUSH
10.0000 mL | INTRAVENOUS | Status: DC | PRN
  Administered 2022-08-09 – 2022-08-13 (×4): 10 mL

## 2022-08-07 MED ORDER — SODIUM CHLORIDE 0.9% IV SOLUTION: Freq: Once | INTRAVENOUS | Status: AC

## 2022-08-07 NOTE — Progress Notes (Signed)
OT Cancellation Note  Patient Details Name: Joann [name] MRN: [id number] DOB: 12/17/1980   Cancelled Treatment:    Reason Eval/Treat Not Completed: Medical issues which prohibited therapy Patient in RED MEWs with soft BP. OT to continue to follow and check back as schedule will allow.  Rennie Plowman, MS Acute Rehabilitation Department Office# [phone]  08/07/2022, 7:12 AM

## 2022-08-07 NOTE — Progress Notes (Signed)
NAME:  Joann [name], MRN:  [id number], DOB:  September 18, 1980, LOS: 30 ADMISSION DATE:  07/25/2022, CONSULTATION DATE: 07/25/2022 REFERRING MD: Ralene Bathe - EDP CHIEF COMPLAINT: Hematemesis    History of Present Illness:  41 year old woman who presented to [facility] ED 11/15 with nausea with multiple episodes of hematemesis over several days with RUQ abdominal, weakness, unable to eat in three days, and several days of dark stools. Drinks a gallon of vodka daily, last drink was 11/14.  History of seizures when she tries to quit. PMHx significant for current EtOH abuse, EtOH cirrhosis, DTs w/seizures, hepatic encephalopathy, HTN and anxiety.  Multiple episodes of small dark bloody emesis in ER.  Hgb 9.7, INR 1.6, Plt 76. Admitted to PCCM.  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  Pertinent Medical History:  EtOH abuse, EtOH cirrhosis with grade 1 esophageal varices, PHG, DTs w/ seizures, hepatic encephalopathy, HTN, anxiety  Previously f/b [facility] Events: Including procedures, antibiotic start and stop dates in addition to other pertinent events   11/15 Admitted hematemesis, intubated/ EGD> esophageal varices s/p 6 bands, multiple blood products/ txa/ vit k 11/16 very hard to sedate then progressively hypotensive despite decreasing sedation> CTH neg, EEG/ cerebell neg, multiple blood products for coagulapathy/ txa/ vit k, tongue biting/ clenching 11/21 Has been minimally alert until today with intermittent following of commands per overnight RN 11/25 Encephalopathic but now moving extremities, not tracking 11/26 self extubated 11/28 Ongoing dark liquid stool, self-discontinued Flexiseal several times. Hgb 7.0.  Interim History / Subjective:  No significant events Self-discontinued Flexiseal multiple times causing additional rectal bleeding Dark  stools in Flexiseal Slightly more awake today  Objective:  Blood pressure 98/62, pulse (!) 114, temperature 99 F (37.2 C), temperature source Axillary, resp. rate 18, height 5' 4"  (1.626 m), weight 70.5 kg, SpO2 100 %.        Intake/Output Summary (Last 24 hours) at 08/07/2022 1110 Last data filed at 08/07/2022 1105 Gross per 24 hour  Intake 1252.66 ml  Output 1025 ml  Net 227.66 ml    Filed Weights   08/02/22 0500 08/03/22 0500 08/06/22 1144  Weight: 73.3 kg 69.5 kg 70.5 kg   Physical Examination: General: Acutely ill-appearing woman in NAD. HEENT: [place]/AT, anicteric sclera, PERRL, moist mucous membranes. Cortrak in place. Neuro: Lethargic. Oriented x 2. Responds to verbal stimuli. Following commands intermittently. Moves all 4 extremities spontaneously. CV: Tachycardic, regular rhythm, no m/g/r. PULM: Breathing even and unlabored on RA. Lung fields CTAB. GI: Soft, nontender, mildly distended. Normoactive bowel sounds. Dark liquid stool present in Flexiseal. Extremities: No LE edema noted. Skin: Warm/dry, no rashes.  Resolved Hospital Problem List:   Hemorrhagic shock +/- septic shock. Sepsis ruled out Stap epi contaminant in blood culture  Assessment & Plan:   Hepatic encephalopathy and ICU delirium CT head NAICA. EEG 11/17 neg. - Rifaximin BID - Lactulose TID - Continue Seroquel - Precedex PRN for agitation, off at present  Acute hypoxic respiratory failure Intubated for EGD 11/15 and left on MV overnight to monitor for rebleeding of banded varices.  Self extubated 11/26 evening. - PT/OT/SLP following, appreciate - Wean O2 for sat > 90% - Pulmonary hygiene  Upper GIB 2/2 bleeding grade III esophageal varices s/p banding, portal hypertensive gastropathy and duodenum lesion s/p APC and clip EtOH cirrhosis EtOH abuse, ongoing, 1 gallon of vodka  daily, last drink 11/14 Portal hypertensive gastropathy  Discriminant score 26.3, steroids deferred at admission. EGD  11/15 by Dr. 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 Completed SBP ppx. - Trend CBC - PPI BID - Repeat EGD in 3-4 weeks per GI  - Continue Flexiseal for now  High risk for ETOH withdrawal, hx of DTs ETOH abuse - now out of window - Monitor for signs/symptoms of withdrawal - Precedex as above - Continue Ativan, Klonopin - Continue thiamine - MV when able to tolerate PO - TOC engagement for cessation counseling/resources  Acute on chronic Anemia - stable Coagulapathy - acute on chronic thrombocytopenia, elevated INR 2 units FFP now with TXA f/b 2 units PRBC 11/15. 1 FFP, 1 platelets, 1 cryo, txa, vit K 11/16. 1 FFP 11/17 Thrombocytopenia - sequestration from advanced liver disease - Trend H&H, Plt - Monitor for signs of active bleeding - Transfuse for Hgb < 7.0, Plt < 20K or hemodynamically significant bleeding  Hypernatremia - 11/25 Hypocalcemia  Hypophos  Hypomagnesia - Trend BMP - Replete electrolytes as indicated (goal K 4, Mg 2) - Monitor I&Os - FWF VT  Hypoglycemia - improved on TF Likely related to liver dysfunction, critical illness, and NPO status - CBGs Q4H - Goal CBG 140-180  PAF- transient related to critical illness; not AC candidate HTN - Hold home meds at present  Urinary retention - Monitor I&Os - Bladder scan PRN  Moderate Malnutrition related to chronic illness - Appreciate Nutrition/RD consult - Monitor for refeeding syndrome  Best Practice: (right click and "Reselect all SmartList Selections" daily)   Diet/type: tubefeeds DVT prophylaxis: SCD GI prophylaxis: PPI Lines: Central line Foley:  Yes, and it is still needed Code Status:  full code Last date of multidisciplinary goals of care discussion: Pending  Husband, Celesta Gentile [phone] (truck driver). Advises  if he can not answer phone to leave a message and he would call back.   Critical care time: N/A   Rhae Lerner [place] [facility] 08/07/22 11:24 AM  Please see [email or web address] for pager details.  From 7A-7P if no response, please call [phone] After hours, please call ELink [phone]

## 2022-08-07 NOTE — Progress Notes (Signed)
PT Cancellation Note  Patient Details Name: Joann [name] MRN: [id number] DOB: November 19, 1980   Cancelled Treatment:    Reason Eval/Treat Not Completed: Medical issues which prohibited therapy, patient in red MEWS. ' Tresa Endo [place] Office [phone] Weekend pager-[phone]    Claretha Cooper 08/07/2022, 7:13 AM

## 2022-08-07 NOTE — Progress Notes (Addendum)
Patient ID: Joann [name], female   DOB: 05/22/1981, 41 y.o.   MRN: [id number]    Progress Note   Subjective   Day # 14 CC; decompensated alcoholic cirrhosis, acute esophageal variceal hemorrhage, severe alcohol withdrawal, prolonged altered mental status  EGD with banding of esophageal varices x6 07/25/2022 Self extubated 08/05/2022 Swallow eval canceled yesterday not alert enough to cooperate  Labs today-WBC 4.5/hemoglobin 7.0/hematocrit 25.4 (down from 8.9), platelets 73 Sodium 144/potassium 4.1 BUN 12/creatinine 0.49 Magnesium 2.0  Patient more alert today, asking repeatedly for soda, asking to be cleaned up, as has pulled out her Flexi-Seal 2 times per nursing, she is having some dark liquid with reddish tinge in Flexi-Seal currently-nursing suspect secondary to pulling out the Flexi-Seal twice last night  Did have a drop in hemoglobin as above, repeat hemoglobin pending   Objective   Vital signs in last 24 hours: Temp:  [98.4 F (36.9 C)-100.4 F (38 C)] 99 F (37.2 C) (11/28 0800) Pulse Rate:  [101-126] 114 (11/28 0800) Resp:  [16-22] 18 (11/28 0800) BP: (69-154)/(35-93) 98/62 (11/28 0700) SpO2:  [96 %-100 %] 100 % (11/28 0800) Weight:  [70.5 kg] 70.5 kg (11/27 1144) Last BM Date : 08/06/22 General: African-American female in NAD feeding tube in nares Heart:  Regular rate and rhythm; no murmurs Lungs: Respirations even and unlabored, lungs CTA bilaterally Abdomen:  Soft, nontender and nondistended. Normal bowel sounds. Extremities:  Without edema. Neurologic:  Alert , semicooperative, able to converse in a few words Psych:  Cooperative.   Intake/Output from previous day: 11/27 0701 - 11/28 0700 In: 3371 [I.V.:1397.8; NG/GT:1823.3; IV Piggyback:150] Out: 1885 [Urine:1000; Emesis/NG output:250; Stool:635] Intake/Output this shift: Total I/O In: 271.3 [NG/GT:271.3] Out: 45 [Stool:45]  Lab Results: Recent Labs    08/05/22 0302 08/06/22 0259 08/07/22 0618  WBC  4.5 4.5 4.5  HGB 9.1* 8.9* 7.0*  HCT 34.2* 32.5* 25.4*  PLT 81* 69* 73*   BMET Recent Labs    08/05/22 0302 08/05/22 1032 08/05/22 1857 08/06/22 0259 08/07/22 0618  NA 155*   < > 147* 148* 144  K 4.2  --   --  4.0 4.1  CL 127*  --   --  116* 113*  CO2 23  --   --  24 27  GLUCOSE 103*  --   --  128* 122*  BUN 8  --   --  6 12  CREATININE 0.41*  --   --  <0.30* 0.49  CALCIUM 8.9  --   --  8.6* 8.3*   < > = values in this interval not displayed.   LFT Recent Labs    08/06/22 0259  PROT 8.5*  ALBUMIN 2.7*  AST 61*  ALT 9  ALKPHOS 96  BILITOT 2.1*   PT/INR No results for input(s): "LABPROT", "INR" in the last 72 hours.  Studies/Results: [place] EKG SITE RITE  Result Date: 08/07/2022 If Site Rite image not attached, placement could not be confirmed due to current cardiac rhythm.  DG CHEST PORT 1 VIEW  Result Date: 08/06/2022 CLINICAL DATA:  Possible aspiration EXAM: PORTABLE CHEST 1 VIEW COMPARISON:  07/31/2022 FINDINGS: Endotracheal tube has been removed. Feeding catheter is noted extending into the stomach. The lungs are clear bilaterally. No findings to suggest aspiration are noted. Cardiac shadow is stable. No bony abnormality is noted. IMPRESSION: No acute abnormality noted. Electronically Signed   By: Inez Catalina M.D.   On: 08/06/2022 20:56   DG Abd 1 View  Result Date:  08/06/2022 CLINICAL DATA:  NGT placement EXAM: ABDOMEN - 1 VIEW COMPARISON:  None Available. FINDINGS: The bowel gas pattern is normal. No radio-opaque calculi or other significant radiographic abnormality are seen. Feeding tube tip overlies the expected location of the duodenal. IMPRESSION: Negative.  Feeding tube appears to be appropriately positioned. Electronically Signed   By: Sammie Bench M.D.   On: 08/06/2022 09:29       Assessment / Plan:    #57 40 year old female with history of severe EtOH abuse, admitted 14 days ago with acute upper GI bleed secondary to esophageal varices for which  she underwent EGD and banding x6 on the day of admission.   twice daily PPI Need repeat EGD/banding in 2 to 3 weeks from now  #2 severe EtOH withdrawal/seizures/DTs requiring vent support sedation prolonged, extubated 08/05/2022  #3 hepatic encephalopathy/metabolic encephalopathy-old swallowing eval yesterday, more alert today  Continue lactulose 30 g 3 times daily and Xifaxan 550 twice daily  #4 nutrition-been receiving tube feeds, retest swallow today and hopefully can start diet  #5 Anemia acute on chronic secondary to blood loss-has had a drop in hemoglobin over the past 24 hours, and has pulled out her Flexi-Seal twice now with dark liquid stool  Plan; as above Trend hemoglobin every 6 hours today transfuse as indicated Unfortunately still needs the Flexi-Seal  Will need repeat EGD in 2 to 3 weeks-Dr. Mansouraty      Principal Problem:   GI bleed Active Problems:   Encephalopathy, hepatic (HCC)   Alcoholic cirrhosis (HCC)   Alcohol withdrawal syndrome with complication (HCC)   Acute upper GI bleed   Bleeding esophageal varices ([place])   AVM (arteriovenous malformation) of small bowel, acquired with hemorrhage   Esophageal varices in cirrhosis ([place])   Hematemesis     LOS: 13 days   Amy EsterwoodPA-C  08/07/2022, 9:04 AM     Attending physician's note   I have reviewed the chart and examined the patient. I performed a substantive portion of this encounter, including complete performance of at least one of the key components, in conjunction with the APP. I agree with the Advanced Practitioner's note, impression and recommendations.   Decompensated EtOH liver cirrhosis with pHTN UGI bleed d/t EV s/p EVL x 6 07/25/2022, now with melanotic stools in Flexi-Seal tube. Hb 9 to 7 s/p 2U.  Most likely from post banding ulcers. Hepatic encephalopathy. Severe EtOH withdrawal/DTs requiring vent support.  Self extubated 08/05/2022.  Not a candidate for liver transplant due to  continued alcohol abuse.  Plan: -Agree with transfusion -Continue protonix. -Trend Hb. Check PT INR -Hold cortrak tube feeding tonight in case repeat EGD as needed. -If continued bleeding, would start IV octreotide -Will follow along   Carmell Austria, MD [facility] [phone]

## 2022-08-07 NOTE — Progress Notes (Signed)
SLP Cancellation Note  Patient Details Name: SHELLYE ZANDI MRN: [id number] DOB: 15-Jun-1981   Cancelled treatment:       Reason Eval/Treat Not Completed: Patient at procedure or test/unavailable  Pt continues to be busy with bedside procedures. Will continue efforts.  Kimon Loewen B. Quentin Ore, [facility] [place] Speech Language Pathologist Office: [phone]  [name] 08/07/2022, 12:20 PM

## 2022-08-07 NOTE — Progress Notes (Signed)
eLink Physician-Brief Progress Note Patient Name: Joann [name] DOB: 01/15/1981 MRN: [id number]   Date of Service  08/07/2022  HPI/Events of Note  Notified of BP 71/48  HR 113 Fluid positive 3 liters since admission but not accounting insensible losses Recently intubated  eICU Interventions  Will give a trial of albumin bolus     Intervention Category Intermediate Interventions: Hypotension - evaluation and management  Shona Needles Zuriel Roskos 08/07/2022, 2:26 AM

## 2022-08-07 NOTE — Progress Notes (Signed)
SLP Cancellation Note  Patient Details Name: KASIAH MANKA MRN: [id number] DOB: 02/02/1981   Cancelled treatment:       Reason Eval/Treat Not Completed: Patient at procedure or test/unavailable  Pt having lab work done. Will continue efforts. RN aware.  Urania Pearlman B. Quentin Ore, [facility], [place] Speech Language Pathologist Office: [phone]  [name] 08/07/2022, 9:39 AM

## 2022-08-07 NOTE — Progress Notes (Signed)
Peripherally Inserted Central Catheter Placement  The IV Nurse has discussed with the patient and/or persons authorized to consent for the patient, the purpose of this procedure and the potential benefits and risks involved with this procedure.  The benefits include less needle sticks, lab draws from the catheter, and the patient may be discharged home with the catheter. Risks include, but not limited to, infection, bleeding, blood clot (thrombus formation), and puncture of an artery; nerve damage and irregular heartbeat and possibility to perform a PICC exchange if needed/ordered by physician.  Alternatives to this procedure were also discussed.  Bard Power PICC patient education guide, fact sheet on infection prevention and patient information card has been provided to patient /or left at bedside.    PICC Placement Documentation  PICC Double Lumen 08/07/22 Right Brachial 37 cm 0 cm (Active)  Indication for Insertion or Continuance of Line Chronic illness with exacerbations (CF, Sickle Cell, etc.) 08/07/22 1814  Exposed Catheter (cm) 0 cm 08/07/22 1814  Site Assessment Clean, Dry, Intact 08/07/22 1814  Lumen #1 Status Flushed;Saline locked;Blood return noted 08/07/22 1814  Lumen #2 Status Flushed;Saline locked;Blood return noted 08/07/22 1814  Dressing Type Transparent;Securing device 08/07/22 1814  Dressing Status Antimicrobial disc in place 08/07/22 1814  Dressing Intervention New dressing;Other (Comment) 08/07/22 1814  Dressing Change Due 08/14/22 08/07/22 1814    Telphone consent signed by Ma Hillock Albarece 08/07/2022, 6:15 PM

## 2022-08-08 ENCOUNTER — Telehealth: Payer: Self-pay

## 2022-08-08 ENCOUNTER — Other Ambulatory Visit: Payer: Self-pay

## 2022-08-08 ENCOUNTER — Inpatient Hospital Stay (HOSPITAL_COMMUNITY): Payer: Medicaid Other

## 2022-08-08 DIAGNOSIS — K7030 Alcoholic cirrhosis of liver without ascites: Secondary | ICD-10-CM

## 2022-08-08 DIAGNOSIS — I8511 Secondary esophageal varices with bleeding: Secondary | ICD-10-CM

## 2022-08-08 DIAGNOSIS — D649 Anemia, unspecified: Secondary | ICD-10-CM

## 2022-08-08 DIAGNOSIS — F1090 Alcohol use, unspecified, uncomplicated: Secondary | ICD-10-CM

## 2022-08-08 LAB — GLUCOSE, CAPILLARY
Glucose-Capillary: 85 mg/dL (ref 70–99)
Glucose-Capillary: 77 mg/dL (ref 70–99)
Glucose-Capillary: 106 mg/dL — ABNORMAL HIGH (ref 70–99)
Glucose-Capillary: 92 mg/dL (ref 70–99)
Glucose-Capillary: 95 mg/dL (ref 70–99)

## 2022-08-08 LAB — CBC
HCT: 30.1 % — ABNORMAL LOW (ref 36.0–46.0)
Hemoglobin: 9 g/dL — ABNORMAL LOW (ref 12.0–15.0)
MCH: 27.7 pg (ref 26.0–34.0)
MCHC: 29.9 g/dL — ABNORMAL LOW (ref 30.0–36.0)
MCV: 92.6 fL (ref 80.0–100.0)
Platelets: 84 10*3/uL — ABNORMAL LOW (ref 150–400)
RBC: 3.25 MIL/uL — ABNORMAL LOW (ref 3.87–5.11)
RDW: 22.2 % — ABNORMAL HIGH (ref 11.5–15.5)
WBC: 4.4 10*3/uL (ref 4.0–10.5)
nRBC: 0 % (ref 0.0–0.2)

## 2022-08-08 LAB — HEPATIC FUNCTION PANEL
ALT: 9 U/L (ref 0–44)
AST: 51 U/L — ABNORMAL HIGH (ref 15–41)
Albumin: 2.7 g/dL — ABNORMAL LOW (ref 3.5–5.0)
Alkaline Phosphatase: 79 U/L (ref 38–126)
Bilirubin, Direct: 1 mg/dL — ABNORMAL HIGH (ref 0.0–0.2)
Indirect Bilirubin: 1.1 mg/dL — ABNORMAL HIGH (ref 0.3–0.9)
Total Bilirubin: 2.1 mg/dL — ABNORMAL HIGH (ref 0.3–1.2)
Total Protein: 7.6 g/dL (ref 6.5–8.1)

## 2022-08-08 LAB — TYPE AND SCREEN
ABO/RH(D): O POS
Antibody Screen: NEGATIVE
Unit division: 0
Unit division: 0
Unit division: 0

## 2022-08-08 LAB — BASIC METABOLIC PANEL
Anion gap: 4 — ABNORMAL LOW (ref 5–15)
BUN: 10 mg/dL (ref 6–20)
CO2: 25 mmol/L (ref 22–32)
Calcium: 8.5 mg/dL — ABNORMAL LOW (ref 8.9–10.3)
Chloride: 119 mmol/L — ABNORMAL HIGH (ref 98–111)
Creatinine, Ser: 0.48 mg/dL (ref 0.44–1.00)
GFR, Estimated: >60 mL/min (ref >60)
Glucose, Bld: 96 mg/dL (ref 70–99)
Potassium: 3.6 mmol/L (ref 3.5–5.1)
Sodium: 148 mmol/L — ABNORMAL HIGH (ref 135–145)

## 2022-08-08 LAB — HEMOGLOBIN AND HEMATOCRIT, BLOOD
HCT: 29.7 % — ABNORMAL LOW (ref 36.0–46.0)
HCT: 30.5 % — ABNORMAL LOW (ref 36.0–46.0)
Hemoglobin: 8.8 g/dL — ABNORMAL LOW (ref 12.0–15.0)
Hemoglobin: 9 g/dL — ABNORMAL LOW (ref 12.0–15.0)

## 2022-08-08 LAB — CULTURE, RESPIRATORY W GRAM STAIN

## 2022-08-08 LAB — BPAM RBC
Blood Product Expiration Date: 202312252359
Blood Product Expiration Date: 202312252359
Blood Product Expiration Date: 202312252359
ISSUE DATE / TIME: 202311281624
ISSUE DATE / TIME: 202311281944
Unit Type and Rh: 5100
Unit Type and Rh: 5100
Unit Type and Rh: 5100

## 2022-08-08 LAB — MAGNESIUM: Magnesium: 1.7 mg/dL (ref 1.7–2.4)

## 2022-08-08 LAB — PHOSPHORUS: Phosphorus: 3.7 mg/dL (ref 2.5–4.6)

## 2022-08-08 LAB — PROTIME-INR
INR: 1.5 — ABNORMAL HIGH (ref 0.8–1.2)
Prothrombin Time: 18.1 seconds — ABNORMAL HIGH (ref 11.4–15.2)

## 2022-08-08 MED ORDER — ZINC SULFATE 220 (50 ZN) MG PO CAPS
220.0000 mg | ORAL_CAPSULE | Freq: Two times a day (BID) | ORAL | Status: DC
  Administered 2022-08-08 – 2022-08-12 (×9): 220 mg
  Filled 2022-08-08 (×9): qty 1

## 2022-08-08 MED ORDER — MAGNESIUM SULFATE 2 GM/50ML IV SOLN
2.0000 g | Freq: Once | INTRAVENOUS | Status: AC
  Administered 2022-08-08: 2 g via INTRAVENOUS
  Filled 2022-08-08: qty 50

## 2022-08-08 MED ORDER — QUETIAPINE FUMARATE 25 MG PO TABS
25.0000 mg | ORAL_TABLET | Freq: Two times a day (BID) | ORAL | Status: DC
  Administered 2022-08-08 – 2022-08-09 (×3): 25 mg
  Filled 2022-08-08 (×4): qty 1

## 2022-08-08 MED ORDER — POTASSIUM CHLORIDE 20 MEQ PO PACK
40.0000 meq | PACK | Freq: Once | ORAL | Status: AC
  Administered 2022-08-08: 40 meq
  Filled 2022-08-08: qty 2

## 2022-08-08 NOTE — Evaluation (Signed)
Clinical/Bedside Swallow Evaluation Patient Details  Name: Joann [name] MRN: [id number] Date of Birth: 10/25/1980  Today's Date: 08/08/2022 Time: SLP Start Time (ACUTE ONLY): 59 SLP Stop Time (ACUTE ONLY): 1028 SLP Time Calculation (min) (ACUTE ONLY): 23 min  Past Medical History:  Past Medical History:  Diagnosis Date   [redacted] weeks gestation of pregnancy 03/22/2020   Alcohol abuse    Alcohol withdrawal delirium ([place]) 02/15/2022   Alcohol-induced acute pancreatitis    Alcoholic ketoacidosis 54/65/6812   Anxiety    DTs (delirium tremens) ([place])    Elevated LFTs 10/24/2018   Encephalopathy acute    Hepatic encephalopathy ([place]) 12/24/2019   Hypertension    Hypokalemia 04/02/2019   Hypotension 02/24/2022   Liver disease    Past Surgical History:  Past Surgical History:  Procedure Laterality Date   ECTOPIC PREGNANCY SURGERY     ESOPHAGEAL BANDING  07/25/2022   Procedure: ESOPHAGEAL BANDING;  Surgeon: Irving Copas., MD;  Location: [facility];  Service: Gastroenterology;;   ESOPHAGOGASTRODUODENOSCOPY (EGD) WITH PROPOFOL N/A 12/27/2019   Procedure: ESOPHAGOGASTRODUODENOSCOPY (EGD) WITH PROPOFOL;  Surgeon: Carol Ada, MD;  Location: [place];  Service: Endoscopy;  Laterality: N/A;   ESOPHAGOGASTRODUODENOSCOPY (EGD) WITH PROPOFOL N/A 06/26/2022   Procedure: ESOPHAGOGASTRODUODENOSCOPY (EGD) WITH PROPOFOL;  Surgeon: Lavena Bullion, DO;  Location: [facility];  Service: Gastroenterology;  Laterality: N/A;   ESOPHAGOGASTRODUODENOSCOPY (EGD) WITH PROPOFOL N/A 07/25/2022   Procedure: ESOPHAGOGASTRODUODENOSCOPY (EGD) WITH PROPOFOL;  Surgeon: Rush Landmark Telford Nab., MD;  Location: [facility];  Service: Gastroenterology;  Laterality: N/A;   HEMOSTASIS CLIP PLACEMENT  07/25/2022   Procedure: HEMOSTASIS CLIP PLACEMENT;  Surgeon: Irving Copas., MD;  Location: [facility];  Service: Gastroenterology;;   HOT HEMOSTASIS N/A 07/25/2022   Procedure: HOT HEMOSTASIS (ARGON  PLASMA COAGULATION/BICAP);  Surgeon: Irving Copas., MD;  Location: [facility];  Service: Gastroenterology;  Laterality: N/A;   HPI:  41yo female admitted [place] [facility] ED 07/25/22 with hematemesis.  Underwent EGD with banding of esophageal varices. Drinks one gallon of vodka per day per chart review, Last drink 11/14. Intubated 11/15 with self-extubation on 11/26.     PMH: ETOH abuse with associated delirium/pancreatitis/ketoacidosis/cirrhosis, DTs with seizures hepatic encephalopathy, HTN, anxiety.    Assessment / Plan / Recommendation  Clinical Impression  Pt greeted lying in bed, voice is dysphonic but with effort, it is clear.  She demonstrates generalized weakness and is dysarthric impairing ability to masticate solids and adequately clear oral cavity. She passed 3 ounce Yale swallow challenge via straw surprisingly.  No increased work of breathing or negative change in vitals observed during minimal intake pt would accept. She required frequent verbal and tactile cues to remain alert to accept intake however and had difficulty holding her head midline *leaning to the right.  Lingual split/abrasion noted midline anterior wihtout pt reportin pain.  At this time, her largest barrier to full diet is her mentation - thus recommend she be allowed thin liquid from floor stock only with full assist from RN. SLP will follow up for tolerance, readiness for dietary advancement or indication of instrumental swallow evaluation. SLP Visit Diagnosis: Dysphagia, oral phase (R13.11);Dysphagia, unspecified (R13.10)    Aspiration Risk  Mild aspiration risk;Risk for inadequate nutrition/hydration    Diet Recommendation Thin liquid (floor stock)   Liquid Administration via: Cup;Straw Medication Administration: Via alternative means Supervision: Full supervision/cueing for compensatory strategies Compensations: Slow rate;Small sips/bites Postural Changes: Seated upright at 90 degrees;Remain upright for at least  30 minutes after po intake  Other  Recommendations Oral Care Recommendations: Oral care BID    Recommendations for follow up therapy are one component of a multi-disciplinary discharge planning process, led by the attending physician.  Recommendations may be updated based on patient status, additional functional criteria and insurance authorization.  Follow up Recommendations Follow physician's recommendations for discharge plan and follow up therapies      Assistance Recommended at Discharge    Functional Status Assessment Patient has had a recent decline in their functional status and demonstrates the ability to make significant improvements in function in a reasonable and predictable amount of time.  Frequency and Duration min 1 x/week  1 week       Prognosis Prognosis for Safe Diet Advancement: Good Barriers to Reach Goals: Cognitive deficits;Other (Comment) (mentation)      Swallow Study   General Date of Onset: 08/08/22 HPI: 41yo female admitted [place] ED 07/25/22 with hematemesis.  Underwent EGD with banding of esophageal varices. Drinks one gallon of vodka per day per chart review, Last drink 11/14. Intubated 11/15 with self-extubation on 11/26.     PMH: ETOH abuse with associated delirium/pancreatitis/ketoacidosis/cirrhosis, DTs with seizures hepatic encephalopathy, HTN, anxiety. Previous Swallow Assessment: SLP treatment 01/05/22-01/14/22 on CIR, 01/18/20-03/07/20- OP working on cognition Diet Prior to this Study: NPO;NG Tube Temperature Spikes Noted: No Respiratory Status: Nasal cannula History of Recent Intubation: Yes Length of Intubations (days): 12 days Date extubated: 08/05/22 (self extubated) Behavior/Cognition: Lethargic/Drowsy Oral Cavity Assessment: Other (comment) (split on anterior tongue - midline -) Oral Cavity - Dentition: Adequate natural dentition Vision: Functional for self-feeding Self-Feeding Abilities: Able to feed self Patient Positioning: Upright in  bed Baseline Vocal Quality: Breathy Volitional Cough: Strong Volitional Swallow: Able to elicit    Oral/Motor/Sensory Function Overall Oral Motor/Sensory Function: Generalized oral weakness   Ice Chips Ice chips: Within functional limits Presentation: Spoon   Thin Liquid Thin Liquid: Impaired Presentation: Cup;Spoon;Straw Oral Phase Functional Implications: Right anterior spillage Pharyngeal  Phase Impairments: Throat Clearing - Delayed    Nectar Thick Nectar Thick Liquid: Not tested   Honey Thick Honey Thick Liquid: Not tested   Puree Puree: Within functional limits Presentation: Spoon   Solid     Solid: Impaired Oral Phase Impairments: Reduced lingual movement/coordination;Impaired mastication Oral Phase Functional Implications: Left lateral sulci pocketing;Oral residue      Macario Golds 08/08/2022,10:52 AM   Kathleen Lime, MS [facility] SLP Acute Rehab Services Office [phone] Pager [phone]

## 2022-08-08 NOTE — NC FL2 (Signed)
[place] LEVEL OF CARE SCREENING TOOL     IDENTIFICATION  Patient Name: Joann [name] Birthdate: 10/31/1980 Sex: female Admission Date (Current Location): 07/25/2022  [facility] and [place] Number:      Facility and Address:  [facility],  [place], [place]      Provider Number: [id number]  Attending Physician Name and Address:  Candee Furbish, MD  Relative Name and Phone Number:       Current Level of Care: Hospital Recommended Level of Care: [place] Prior Approval Number:    Date Approved/Denied:   PASRR Number: [id number]  Discharge Plan: SNF    Current Diagnoses: Patient Active Problem List   Diagnosis Date Noted   GI bleed 07/25/2022   Bleeding esophageal varices ([place]) 07/25/2022   AVM (arteriovenous malformation) of small bowel, acquired with hemorrhage 07/25/2022   Esophageal varices in cirrhosis ([place]) 07/25/2022   Hematemesis 07/25/2022   Acute upper GI bleed 07/01/2022   Sepsis due to methicillin susceptible Staphylococcus aureus (MSSA) with acute organ dysfunction and septic shock ([place]) 07/01/2022   Lactic acidosis 07/01/2022   AKI (acute kidney injury) ([place]) 07/01/2022   Essential hypertension 07/01/2022   Ileus ([place]) 07/01/2022   Ulcer of esophagus without bleeding    Portal hypertensive gastropathy (HCC)    Alcohol withdrawal syndrome with complication (HCC)    Alcoholic hepatitis    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)    Abnormal LFTs    Alcoholic gastritis with bleeding 04/17/2022   ETOH abuse 04/16/2022   Electrolyte abnormality 02/24/2022   Leukopenia 02/24/2022   Coagulopathy ([place]) 02/24/2022   Severe protein-calorie malnutrition ([place]) 02/24/2022   Anxiety    Symptomatic anemia 12/17/2021   Esophageal varices without bleeding ([place]) 70/26/3785   Alcoholic cirrhosis ([place]) 88/50/2774   Reactive depression    Wernicke's encephalopathy    Transaminitis     Steroid-induced hyperglycemia    Leucocytosis    Macrocytic anemia    Sleep disturbance    Pressure injury of skin 12/87/8676   Alcoholic hepatitis with ascites    Acute respiratory failure ([place])    Palliative care encounter    Elevated bilirubin    Ascites    Advanced care planning/counseling discussion    Goals of care, counseling/discussion    Palliative care by specialist    Hypoalbuminemia    Encephalopathy, hepatic ([place]) 12/24/2019   Alcohol dependence with withdrawal, unspecified ([place]) 10/24/2018   Thrombocytopenia ([place]) 10/24/2018   Alcohol abuse with alcohol-induced mood disorder ([place]) 09/14/2017    Orientation RESPIRATION BLADDER Height & Weight     Self, Time, Situation, Place  Normal Continent Weight: 70.5 kg Height:  5' 4"  (162.6 cm)  BEHAVIORAL SYMPTOMS/MOOD NEUROLOGICAL BOWEL NUTRITION STATUS      Continent Diet (regular)  AMBULATORY STATUS COMMUNICATION OF NEEDS Skin   Extensive Assist Verbally Normal                       Personal Care Assistance Level of Assistance  Bathing, Feeding, Dressing Bathing Assistance: Limited assistance Feeding assistance: Limited assistance Dressing Assistance: Limited assistance     Functional Limitations Info  Sight, Hearing, Speech Sight Info: Adequate Hearing Info: Adequate Speech Info: Adequate    SPECIAL CARE FACTORS FREQUENCY  PT (By licensed PT), OT (By licensed OT)     PT Frequency: 5 x weekly OT Frequency: 5 x weekly  Contractures Contractures Info: Not present    Additional Factors Info  Code Status Code Status Info: full             Current Medications (08/08/2022):  This is the current hospital active medication list Current Facility-Administered Medications  Medication Dose Route Frequency Provider Last Rate Last Admin   0.9 %  sodium chloride infusion   Intravenous PRN Noemi Chapel P, DO 10 mL/hr at 08/01/22 0615 10 mL/hr at 08/01/22 0615   acetaminophen (TYLENOL) 160  MG/5ML solution 650 mg  650 mg Per Tube Q8H PRN Judd Lien, MD   650 mg at 08/06/22 2210   Chlorhexidine Gluconate Cloth 2 % PADS 6 each  6 each Topical Daily Julian Hy, DO   6 each at 08/07/22 1104   clonazePAM (KLONOPIN) tablet 0.5 mg  0.5 mg Per Tube BID Candee Furbish, MD   0.5 mg at 08/08/22 0912   feeding supplement (PROSource TF20) liquid 60 mL  60 mL Per Tube Daily Margaretha Seeds, MD   60 mL at 08/08/22 0910   feeding supplement (VITAL 1.5 CAL) liquid 1,000 mL  1,000 mL Per Tube Continuous Margaretha Seeds, MD 55 mL/hr at 08/07/22 1736 Infusion Verify at 08/07/22 1736   fentaNYL (SUBLIMAZE) injection 50-200 mcg  50-200 mcg Intravenous Q30 min PRN Maryjane Hurter, MD   100 mcg at 03/49/17 9150   folic acid (FOLVITE) tablet 1 mg  1 mg Per Tube Daily Adrian Saran, RPH   1 mg at 08/08/22 5697   free water 300 mL  300 mL Per Tube Q4H Margaretha Seeds, MD   300 mL at 08/07/22 1606   insulin aspart (novoLOG) injection 0-9 Units  0-9 Units Subcutaneous Q4H Polly Cobia, RPH   1 Units at 08/06/22 1236   lactulose (CHRONULAC) 10 GM/15ML solution 30 g  30 g Per Tube TID [place]our[place], Telford Nab., MD   30 g at 08/08/22 0910   LORazepam (ATIVAN) injection 2 mg  2 mg Intravenous Q4H PRN Candee Furbish, MD   2 mg at 08/08/22 9480   Oral care mouth rinse  15 mL Mouth Rinse PRN Noemi Chapel P, DO       pantoprazole (PROTONIX) injection 40 mg  40 mg Intravenous Q12H Adrian Saran, RPH   40 mg at 08/08/22 0910   QUEtiapine (SEROQUEL) tablet 25 mg  25 mg Per Tube BID Candee Furbish, MD       rifaximin Doreene Nest) tablet 550 mg  550 mg Per Tube BID Willia Craze, NP   550 mg at 08/08/22 0912   sodium chloride flush (NS) 0.9 % injection 10-40 mL  10-40 mL Intracatheter Q12H Candee Furbish, MD   10 mL at 08/08/22 0912   sodium chloride flush (NS) 0.9 % injection 10-40 mL  10-40 mL Intracatheter PRN Candee Furbish, MD       zinc sulfate capsule 220 mg  220 mg Per Tube BID Candee Furbish, MD         Discharge Medications: Please see discharge summary for a list of discharge medications.  Relevant Imaging Results:  Relevant Lab Results:   Additional Information ssn: [id number]  Leeroy Cha, RN

## 2022-08-08 NOTE — TOC Progression Note (Signed)
Transition of Care [facility]) - Progression Note    Patient Details  Name: EDNAH HAMMOCK MRN: [id number] Date of Birth: 1981/01/05  Transition of [facility]) CM/SW Contact  Leeroy Cha, RN Phone Number: 08/08/2022, 10:03 AM  Clinical Narrative:    Patient fl2 sent out via hub to area snf's for review.   Expected Discharge Plan: Home/Self Care Barriers to Discharge: Continued Medical Work up  Expected Discharge Plan and Services Expected Discharge Plan: Home/Self Care In-house Referral: NA Discharge Planning Services: CM Consult                     DME Arranged: N/A DME Agency: NA       HH Arranged: NA [place] Agency: NA         Social Determinants of Health (SDOH) Interventions    Readmission Risk Interventions   Row Labels 07/25/2022    2:28 PM 02/20/2022    2:32 PM 02/19/2022   10:32 AM  Readmission Risk Prevention Plan   Section Header. No data exists in this row.     Transportation Screening   Complete    PCP or Specialist Appt within 3-5 Days       HRI or [place] for [place] Planning/Counseling       Palliative Care Screening       Medication Review Press [employer])   Complete    PCP or Specialist appointment within 3-5 days of discharge   Complete    HRI or [place]   Complete    SW Recovery Care/Counseling Consult   Complete    Palliative Care Screening   Not [place]   Not Applicable       Information is confidential and restricted. Go to Review Flowsheets to unlock data.

## 2022-08-08 NOTE — Progress Notes (Signed)
NAME:  Joann [name], MRN:  [id number], DOB:  [birth year], LOS: 26 ADMISSION DATE:  07/25/2022, CONSULTATION DATE: 07/25/2022 REFERRING MD: Ralene Bathe - EDP CHIEF COMPLAINT: Hematemesis    History of Present Illness:  41 year old woman who presented to [facility] ED 11/15 with nausea with multiple episodes of hematemesis over several days with RUQ abdominal, weakness, unable to eat in three days, and several days of dark stools. Drinks a gallon of vodka daily, last drink was 11/14.  History of seizures when she tries to quit. PMHx significant for current EtOH abuse, EtOH cirrhosis, DTs w/seizures, hepatic encephalopathy, HTN and anxiety.  Multiple episodes of small dark bloody emesis in ER.  Hgb 9.7, INR 1.6, Plt 76. Admitted to PCCM.  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  Pertinent Medical History:  EtOH abuse, EtOH cirrhosis with grade 1 esophageal varices, PHG, DTs w/ seizures, hepatic encephalopathy, HTN, anxiety  Previously f/b [place] [facility] Events: Including procedures, antibiotic start and stop dates in addition to other pertinent events   11/15 Admitted hematemesis, intubated/ EGD> esophageal varices s/p 6 bands, multiple blood products/ txa/ vit k 11/16 very hard to sedate then progressively hypotensive despite decreasing sedation> CTH neg, EEG/ cerebell neg, multiple blood products for coagulapathy/ txa/ vit k, tongue biting/ clenching 11/21 Has been minimally alert until today with intermittent following of commands per overnight RN 11/25 Encephalopathic but now moving extremities, not tracking 11/26 self extubated 11/28 Ongoing dark liquid stool, self-discontinued Flexiseal several times. Hgb 7.0.  Interim History / Subjective:  No events. More alert today. Wants to eat.  Objective:  Blood pressure 116/74, pulse (!) 106, temperature  98.5 F (36.9 C), temperature source Oral, resp. rate 10, height 5' 4"  (1.626 m), weight 70.5 kg, SpO2 96 %.        Intake/Output Summary (Last 24 hours) at 08/08/2022 1129 Last data filed at 08/08/2022 0945 Gross per 24 hour  Intake 1817.24 ml  Output 3245 ml  Net -1427.76 ml    Filed Weights   08/02/22 0500 08/03/22 0500 08/06/22 1144  Weight: 73.3 kg 69.5 kg 70.5 kg   Physical Examination: No distress Lungs diminished bases, occasional rhonci R Abd soft Flexiseal in place, looks less bloody today NGT in place Heart sounds tachy, ext warm Aox3, poor insight  Hgb responded to blood Plts better BMP okay except mg (replaced), K (replaced), and hyper-Na  Resolved Hospital Problem List:   Hemorrhagic shock +/- septic shock. Sepsis ruled out Stap epi contaminant in blood culture  Assessment & Plan:   Hepatic encephalopathy and ICU delirium CT head NAICA. EEG 11/17 neg. - Rifaximin BID - Lactulose TID - Continue Seroquel  Acute hypoxic respiratory failure Intubated for EGD 11/15 and left on MV overnight to monitor for rebleeding of banded varices.  Self extubated 11/26 evening. - PT/OT/SLP following, appreciate - Wean O2 for sat > 90% - Pulmonary hygiene  Upper GIB 2/2 bleeding grade III esophageal varices s/p banding, portal hypertensive gastropathy and duodenum lesion s/p APC and clip EtOH cirrhosis EtOH abuse, ongoing, 1 gallon of vodka daily, last drink 11/14 Portal hypertensive gastropathy  Discriminant score 26.3, steroids deferred at admission. EGD 11/15 by Dr. 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 Completed SBP ppx. - Trend CBC - PPI BID - Repeat EGD in 3-4 weeks per GI  - Continue Flexiseal for now until able to toilet herself  High risk for ETOH withdrawal, hx of  DTs ETOH abuse - now out of window - Monitor for signs/symptoms of withdrawal - Precedex as above - Continue Klonopin, thiamine - TOC engagement for cessation counseling/resources  Acute on chronic Anemia - stable Coagulapathy - acute on chronic thrombocytopenia, elevated INR 2 units FFP now with TXA f/b 2 units PRBC 11/15. 1 FFP, 1 platelets, 1 cryo, txa, vit K 11/16. 1 FFP 11/17 Thrombocytopenia - sequestration from advanced liver disease - Trend H&H, Plt - Monitor for signs of active bleeding - Transfuse for Hgb < 7.0, Plt < 20K or hemodynamically significant bleeding  Hypernatremia - 11/25 Hypocalcemia  Hypophos  Hypomagnesia - Trend BMP - Replete electrolytes as indicated (goal K 4, Mg 2) - Monitor I&Os - FWF VT  Hypoglycemia - improved on TF Likely related to liver dysfunction, critical illness, and NPO status - CBGs Q4H - Goal CBG 140-180  PAF- transient related to critical illness; not AC candidate HTN - Hold home meds at present  Urinary retention - Monitor I&Os - Bladder scan PRN  Moderate Malnutrition related to chronic illness - Appreciate Nutrition/RD consult - Monitor for refeeding syndrome - For MBSS today  Best Practice: (right click and "Reselect all SmartList Selections" daily)   Diet/type: tubefeeds DVT prophylaxis: SCD GI prophylaxis: PPI Lines: Central line Foley:  Yes, and it is still needed Code Status:  full code Last date of multidisciplinary goals of care discussion: Pending  Husband, Celesta Gentile [phone] (truck driver). Advises if he can not answer phone to leave a message and he would call back.   Stable for transfer to progressive, appreciate [facility] taking over starting 08/09/22.  Remaining issues: PT eval, continued mental status improvement, stable H/H  Critical care time: N/A   Candee Furbish, MD [facility] 08/08/22 11:29 AM  Please see [email or web address] for pager details.  From 7A-7P if no response, please  call [phone] After hours, please call ELink [phone]

## 2022-08-08 NOTE — Telephone Encounter (Signed)
-----   Message from Joann Ferguson, PA-C sent at 08/08/2022 11:32 AM EST ----- Regarding: EGD [name], this is a patient of Dr. Jerrol Banana is still hospitalized, has been here for couple of weeks after an acute variceal hemorrhage and severe hepatic encephalopathy Needs to be scheduled for a follow-up EGD with Dr. Rush Landmark I spoke with him this morning and he suggested December 28 at [facility].  We are signing off at present, I am not sure that she will be able to return to home, she may be in rehab once she is able to be discharged.

## 2022-08-08 NOTE — Progress Notes (Addendum)
Patient ID: Joann [name], female   DOB: 10-Sep-1981, 41 y.o.   MRN: [id number]    Progress Note   Subjective   Day # 15 CC; decompensated alcoholic cirrhosis, admitted with acute esophageal variceal hemorrhage, severe EtOH withdrawal, prolonged altered mental status/[place] encephalopathy  EGD with banding of esophageal varices x6 07/25/2022 Self-extubated 11/26 Failed swallowing evaluation, repeated today and has passed, felt to be mild aspiration risk. Had pulled out her Flexi-Seal multiple times over the past 36 to 48 hours,-has not pulled it since, she did have some dark melenic appearing stool yesterday which has cleared Stool green-brown today 2 units packed RBCs yesterday-last hemoglobin last p.m. 8.8 improved  Patient is more alert today, spoke to me when I came into the room, begging for food no other complaints today, knows she is in the hospital-I reviewed the series of events with her this morning     Objective   Vital signs in last 24 hours: Temp:  [97.8 F (36.6 C)-99.5 F (37.5 C)] 98.5 F (36.9 C) (11/29 0800) Pulse Rate:  [87-117] 106 (11/29 1100) Resp:  [10-23] 10 (11/29 1100) BP: (83-123)/(57-85) 116/74 (11/29 1100) SpO2:  [94 %-100 %] 96 % (11/29 1100) Last BM Date : 08/08/22 General:     AA female in NAD, core track in place Heart: tachy Regular rate and rhythm; no murmurs Lungs: Respirations even and unlabored, lungs CTA bilaterally Abdomen:  Soft, nontender nontense ascites  Normal bowel sounds. Extremities:  Without edema. Neurologic:  Alert and oriented x2, more cooperative and appropriate Psych: Affect flat  Intake/Output from previous day: 11/28 0701 - 11/29 0700 In: 1838.6 [I.V.:11.5; Blood:891.3; NG/GT:935.7] Out: 2790 [Urine:700; Stool:2090] Intake/Output this shift: Total I/O In: 250 [NG/GT:200; IV Piggyback:50] Out: 545 [Urine:500; Stool:45]  Lab Results: Recent Labs    08/07/22 0618 08/07/22 1222 08/07/22 1848 08/07/22 2313  WBC 4.5  3.7*  --  4.4  HGB 7.0* 7.9* 7.1* 9.0*  8.8*  HCT 25.4* 28.3* 25.8* 30.1*  29.7*  PLT 73* 76*  --  84*   BMET Recent Labs    08/06/22 0259 08/07/22 0618 08/07/22 2313  NA 148* 144 148*  K 4.0 4.1 3.6  CL 116* 113* 119*  CO2 24 27 25   GLUCOSE 128* 122* 96  BUN 6 12 10   CREATININE <0.30* 0.49 0.48  CALCIUM 8.6* 8.3* 8.5*   LFT Recent Labs    08/07/22 2313  PROT 7.6  ALBUMIN 2.7*  AST 51*  ALT 9  ALKPHOS 79  BILITOT 2.1*  BILIDIR 1.0*  IBILI 1.1*   PT/INR Recent Labs    08/07/22 2313  LABPROT 18.1*  INR 1.5*    Studies/Results: [place] EKG SITE RITE  Result Date: 08/07/2022 If Site Rite image not attached, placement could not be confirmed due to current cardiac rhythm.  DG CHEST PORT 1 VIEW  Result Date: 08/06/2022 CLINICAL DATA:  Possible aspiration EXAM: PORTABLE CHEST 1 VIEW COMPARISON:  07/31/2022 FINDINGS: Endotracheal tube has been removed. Feeding catheter is noted extending into the stomach. The lungs are clear bilaterally. No findings to suggest aspiration are noted. Cardiac shadow is stable. No bony abnormality is noted. IMPRESSION: No acute abnormality noted. Electronically Signed   By: Inez Catalina M.D.   On: 08/06/2022 20:56       Assessment / Plan:    #60 41 year old African-American female with history of severe EtOH abuse, admitted 15 days ago with acute upper GI bleed secondary to esophageal varices for which he underwent EGD and  banding x6 on the day of admission.  She had not had any evidence of recurrent bleeding until yesterday had some dark melenic appearing stool however she had also pulled out her Flexi-Seal multiple times around that same time. She did receive 2 units of packed RBCs yesterday with appropriate response, last hemoglobin 8.8 stool brown and normal-appearing today  Continues on twice daily PPI , Will continue to monitor hemoglobin Plan is for repeat EGD with Dr. Rush Landmark late December for variceal surveillance  Do not  think she needs repeat EGD today, and would prefer not to have to sedate her given her issues with severe encephalopathy  #2 severe hepatic encephalopathy/metabolic encephalopathy-gradually improving, continue high-dose lactulose and Xifaxan twice daily  #3 nutrition-passed swallowing eval today, start clear liquids and advance as tolerated to 2 g sodium diet #4 anemia acute on chronic secondary to GI blood loss  Plan; as outlined above Would leave Flexi-Seal in for another 24 hours, if her mental status continues to improve, hopefully this will be able to be discontinued in the next couple of days GI will sign off, available for problems, continue to monitor hemoglobin  We will arrange for EGD with Dr. Rush Landmark to be done on December 28, outpatient.   Principal Problem:   GI bleed Active Problems:   Encephalopathy, hepatic (HCC)   Alcoholic cirrhosis (HCC)   Alcohol withdrawal syndrome with complication (HCC)   Acute upper GI bleed   Bleeding esophageal varices (HCC)   AVM (arteriovenous malformation) of small bowel, acquired with hemorrhage   Esophageal varices in cirrhosis ([place])   Hematemesis     LOS: 14 days   Amy Esterwood PA-C 08/08/2022, 11:17 AM   Attending physician's note   I have taken history, reviewed the chart and examined the patient. I performed a substantive portion of this encounter, including complete performance of at least one of the key components, in conjunction with the APP. I agree with the Advanced Practitioner's note, impression and recommendations.   No further melena. Hb 8.8 (after 2U yesterday).  Advance to low-salt diet as tolerated Continue lactulose/rifaximin/Protonix/Zn Strict alcohol abstinence.  Will sign off for now.  Scheduled for repeat EGD with Dr. Rush Landmark Sep 06, 2022 with rpt EVL, if needed.    Carmell Austria, MD [facility] [phone]

## 2022-08-08 NOTE — Evaluation (Signed)
Physical Therapy Evaluation Patient Details Name: Joann [name] MRN: [id number] DOB: 1980/09/25 Today's Date: 08/08/2022  History of Present Illness  Pt is a 41yo female presenting to [facility] ED with hematemesis, underwent EGD with banding of esophageal varices. Drinks one gallon of vodka per day per chart review reporting last drink 11/14. Intubated 11/15 with self-extubation on 11/26. PMH: ETOH abuse with associated delirium/pancreatitis/ketoacidosis/cirrhosis, Dts with seizures hepatic encephalopathy, HTN, anxiety.  Clinical Impression  Pt admitted with above diagnosis.  Pt currently with functional limitations due to the deficits listed below (see PT Problem List). Pt will benefit from skilled PT to increase their independence and safety with mobility to allow discharge to the venue listed below.     The patient is awake, speech is  soft.  Patient oriented to place.  Soft restraints removed from legs and  hands  in order to participate. Patient was calm and  able to participate in mobility and sitting on side of bed x ~ 10 mins,  Patient noted right  head turn to the right with limited  ROM to rotate to the left , only to neutral.   Continue PT for mobility. HR up to 115 , on RA.      Recommendations for follow up therapy are one component of a multi-disciplinary discharge planning process, led by the attending physician.  Recommendations may be updated based on patient status, additional functional criteria and insurance authorization.  Follow Up Recommendations Skilled nursing-short term rehab (<3 hours/day) Can patient physically be transported by private vehicle: No    Assistance Recommended at Discharge Frequent or constant Supervision/Assistance  Patient can return home with the following  A lot of help with walking and/or transfers;A lot of help with bathing/dressing/bathroom;Assistance with cooking/housework;Assist for transportation;Help with stairs or ramp for entrance    Equipment  Recommendations None recommended by PT  Recommendations for Other Services       Functional Status Assessment Patient has had a recent decline in their functional status and demonstrates the ability to make significant improvements in function in a reasonable and predictable amount of time.     Precautions / Restrictions Precautions Precautions: Fall Precaution Comments: in soft restraints, NG Panda, flexiseal Restrictions Weight Bearing Restrictions: No      Mobility  Bed Mobility Overal bed mobility: Needs Assistance Bed Mobility: Rolling, Supine to Sit, Sit to Supine Rolling: Max assist, +2 for safety/equipment, Mod assist   Supine to sit: Max assist, +2 for safety/equipment, +2 for physical assistance Sit to supine: Max assist, +2 for safety/equipment   General bed mobility comments: multimodal cue to use UE's to try to pushto upright position, remaiuned listing to the right , trunk lordotic and rotated to the right. Assisted with legs and cues to go to right  elbow then back to side, required hand over hand facilitation    Transfers                   General transfer comment: deferred    Ambulation/Gait                  Stairs            Wheelchair Mobility    Modified Rankin (Stroke Patients Only)       Balance Overall balance assessment: Needs assistance Sitting-balance support: Feet unsupported, No upper extremity supported Sitting balance-Leahy Scale: Poor   Postural control: Right lateral lean, Posterior lean  Pertinent Vitals/Pain Pain Assessment Pain Assessment: Faces Pain Location: hunger pain Pain Intervention(s): Monitored during session    Home Living Family/patient expects to be discharged to:: Private residence Living Arrangements: Spouse/significant other;Children Available Help at Discharge: Family;Available PRN/intermittently Type of Home: House Home Access: Stairs  to enter   Entrance Stairs-Number of Steps: 4 Alternate Level Stairs-Number of Steps: 6 Home Layout: Two level;Able to live on main level with bedroom/bathroom Home Equipment: Rolling Walker (2 wheels);BSC/3in1 Additional Comments: Above home info from previous encounter, patient  unable to provide all info. states that she lives w/ daughter.    Prior Function Prior Level of Function : Independent/Modified Independent             Mobility Comments: was ambulatory at DC 2 mos ago       Hand Dominance   Dominant Hand: Right    Extremity/Trunk Assessment        Lower Extremity Assessment Lower Extremity Assessment: Generalized weakness    Cervical / Trunk Assessment Cervical / Trunk Assessment: Other exceptions Cervical / Trunk Exceptions: right head turn preference,   lack of range to rotate to the left, required much cues to  look to the left, only gets to neutral  Communication   Communication:  (speach is soft and difficulty to understand at times)  Cognition Arousal/Alertness: Awake/alert Behavior During Therapy: Flat affect Overall Cognitive Status: Impaired/Different from baseline Area of Impairment: Orientation, Attention, Following commands                 Orientation Level: Time, Situation Current Attention Level: Focused   Following Commands: Follows one step commands inconsistently       General Comments: awake, at times, "spaces out", frequently asks for food. follows with slow responses.        General Comments      Exercises     Assessment/Plan    PT Assessment Patient needs continued PT services  PT Problem List Decreased strength;Decreased mobility;Decreased safety awareness;Decreased coordination;Decreased knowledge of precautions;Decreased activity tolerance;Decreased cognition;Decreased balance       PT Treatment Interventions DME instruction;Therapeutic activities;Gait training;Therapeutic exercise;Patient/family  education;Functional mobility training;Balance training    PT Goals (Current goals can be found in the Care Plan section)  Acute Rehab PT Goals PT Goal Formulation: Patient unable to participate in goal setting Time For Goal Achievement: 08/22/22 Potential to Achieve Goals: Fair    Frequency Min 2X/week     Co-evaluation PT/OT/SLP Co-Evaluation/Treatment: Yes Reason for Co-Treatment: For patient/therapist safety;To address functional/ADL transfers PT goals addressed during session: Mobility/safety with mobility;Balance;Strengthening/ROM OT goals addressed during session: ADL's and self-care       AM-PAC PT "6 Clicks" Mobility  Outcome Measure Help needed turning from your back to your side while in a flat bed without using bedrails?: A Lot Help needed moving from lying on your back to sitting on the side of a flat bed without using bedrails?: A Lot Help needed moving to and from a bed to a chair (including a wheelchair)?: A Lot Help needed standing up from a chair using your arms (e.g., wheelchair or bedside chair)?: Total Help needed to walk in hospital room?: Total Help needed climbing 3-5 steps with a railing? : Total 6 Click Score: 9    End of Session   Activity Tolerance: Patient tolerated treatment well Patient left: in bed;with call bell/phone within reach (bed alarm broken, RN alerted) Nurse Communication: Mobility status PT Visit Diagnosis: Unsteadiness on feet (R26.81);Muscle weakness (generalized) (M62.81);Difficulty in walking, not elsewhere  classified (R26.2)    Time: 9024-0973 PT Time Calculation (min) (ACUTE ONLY): 31 min   Charges:   PT Evaluation $PT Eval Low Complexity: 1 Low          .soign   Claretha Cooper 08/08/2022, 9:45 AM

## 2022-08-08 NOTE — Telephone Encounter (Signed)
12/28 date is full.  Pt was added of Dr Marla Roe for ERCP.  We have one spot for the rest of the year. I will add to the last date on  12/21 at 4 pm. I will mail the information to the pt home, and pt will be given the information at discharge.  I will also send a copy to My Chart.  I did try calling the number in the chart but there was no answer and no voice mail.

## 2022-08-08 NOTE — Telephone Encounter (Signed)
Keep it in chart, they will see at time of discharge. GM

## 2022-08-08 NOTE — Progress Notes (Signed)
[name] Progress Note Patient Name: Joann [name] DOB: Sep 10, 1981 MRN: [id number]   Date of Service  08/08/2022  HPI/Events of Note  Agitation - Nursing request for bilateral soft restraint order renewal.   eICU Interventions  Plan: Will renew bilateral soft restraint orders X 12 hours.      Intervention Category Major Interventions: Delirium, psychosis, severe agitation - evaluation and management  Kore Madlock Eugene 08/08/2022, 9:03 PM

## 2022-08-08 NOTE — Progress Notes (Signed)
Nutrition Follow-up  DOCUMENTATION CODES:   Not applicable  INTERVENTION:  - Per SLP recs today patient able to have floor stock thin liquids.  - Trickle tube feeds at 73m/hr restarted this afternoon - provides 720 calories, 32g protein, and 3668mfree water from formula over 24 hours. - 30024mree water flushes Q4H per MD.  - If unable to advance diet past floor stock thin liquids, would recommend advancing back to goal TF to meet nutritional needs.  Vital 1.5 at 55 ml/h (1320 ml per day) *Would advance by 42m104mH to goal  Prosource TF20 60 ml daily Provides 2060 kcal, 109 gm protein, 1008 ml free water daily   - Monitor electrolytes. - Continue folic acid supplementation as medically appropriate. - Continue zinc supplementation as medically appropriate.  - Monitor weight trends.   NUTRITION DIAGNOSIS:   Moderate Malnutrition related to chronic illness (cirrhosis and esophageal varices) as evidenced by percent weight loss, mild fat depletion, mild muscle depletion. *ongoing  GOAL:   Patient will meet greater than or equal to 90% of their needs *not being met  MONITOR:   Vent status  REASON FOR ASSESSMENT:   Other (Comment) (verbal consult to start TPN)    ASSESSMENT:   Pt is a 41yo11yoith PMH of alcohol abuse, cirrhosis, recent upper GI bleed, DTs w/seizures, hepatic encephalopathy, HTN, and anxiety who presents with vomiting blood x1 day and several days of melena.  11/15 Intubated 11/16 unable to place NG tube due to esophogeal varices per GI 11/20 pt NPO w/ no nutrition x5 days due to inability to place NGT. Plan to start TPN but GI later gave okay to place NGT so RN placed 11/21 NGT dislodged 11/21 NGT replaced, TF started 11/24 reached goal TF of Vital 1.5 at 55mL33m11/26 pt self extubated; NG in place and TFs continued  Patient not oriented to answer questions at time of visit this AM.  Patient had SLP eval today and recommended flood stock of thin  liquids. Nursing order in to allow patient above.   RN at bedside reports TFs stopped overnight. This AM patient pulled out NG tube but this had been replaced before time RD visit. Xray read by MD reported tip in distal stomach or proximal duodenum.  Per RN, possible plan to restart trickle feeds after placement verified. Per chart review RN restarted tube feeds at 20mL/31mt ~2pm per verbal MD order.    Medications reviewed and include: Folic acid, Insulin, Zinc, 300mL Q25mWF  Labs reviewed:  Na 148   Diet Order:   Diet Order             Diet NPO time specified  Diet effective now                   EDUCATION NEEDS:  Not appropriate for education at this time  Skin:  Skin Assessment: Reviewed RN Assessment Skin Integrity Issues:: Unstageable Unstageable: tongue  Last BM:  11/29 - rectal tube  Height:  Ht Readings from Last 1 Encounters:  07/28/22 _0  (1.626 m)   Weight:  Wt Readings from Last 1 Encounters:  08/06/22 70.5 kg    BMI:  Body mass index is 26.68 kg/m.  Estimated Nutritional Needs:  Kcal:  1900-2250 kcal Protein:  85-110 grams Fluid:  >/= 1.9L    Deatrice Spanbauer TSamson FredericN For contact information, refer to [email or web address]

## 2022-08-08 NOTE — Evaluation (Signed)
Occupational Therapy Evaluation Patient Details Name: Joann [name] MRN: [id number] DOB: September 13, 1980 Today's Date: 08/08/2022   History of Present Illness Patient is a 41 year old female who presented to the hospital on 10/10 with fatigue, confusion and weakness trying to detox at home. patient was found to be in septic shock, alcohol withdrawl with delirium, acute upper GI bleed, ileus, and AKI.  10/12 patient was transfused 1 unit PRBCs. patient underwent EGD 0n 10/17 with discovery of ulcer in esophagus and small varix. NG tube was removed on 10/21 and patient was transitioned to medical floor.   PMH: alcohol cirrhosis, HTN, esophageal varices, hepatic encephalopathy,   Clinical Impression   Patient is a 41 year old female who was admitted for above. Patient unable to provide PLOF with no family in room.  Patient was +2 for bed mobility with listing to R side with patient favoring head positioning to R side as well. Patient was TD for attempting to wash face sitting EOB with patient unable to move UE to touch face with resistive to HHA. Patient was noted to have decreased functional activity tolernace, decreased ROM, decreased BUE strength, decreased endurance, decreased sitting balance, decreased standing balanced, decreased safety awareness, and decreased knowledge of AE/AD impacting participation in ADLs. Patient would continue to benefit from skilled OT services at this time while admitted and after d/c to address noted deficits in order to improve overall safety and independence in ADLs.       Recommendations for follow up therapy are one component of a multi-disciplinary discharge planning process, led by the attending physician.  Recommendations may be updated based on patient status, additional functional criteria and insurance authorization.   Follow Up Recommendations  Skilled nursing-short term rehab (<3 hours/day)     Assistance Recommended at Discharge Frequent or constant  Supervision/Assistance  Patient can return home with the following Two people to help with walking and/or transfers;Assistance with cooking/housework;Direct supervision/assist for medications management;Assist for transportation;Help with stairs or ramp for entrance;Direct supervision/assist for financial management;Two people to help with bathing/dressing/bathroom    Functional Status Assessment  Patient has had a recent decline in their functional status and demonstrates the ability to make significant improvements in function in a reasonable and predictable amount of time.  Equipment Recommendations       Recommendations for Other Services       Precautions / Restrictions Precautions Precautions: Fall Precaution Comments: in soft restraints, NG Panda, flexiseal Restrictions Weight Bearing Restrictions: No      Mobility Bed Mobility Overal bed mobility: Needs Assistance Bed Mobility: Rolling, Supine to Sit, Sit to Supine Rolling: Max assist, +2 for safety/equipment, Mod assist   Supine to sit: Max assist, +2 for safety/equipment, +2 for physical assistance Sit to supine: Max assist, +2 for safety/equipment   General bed mobility comments: multimodal cue to use UE's to try to pushto upright position, remaiuned listing to the right , trunk lordotic and rotated to the right. Assisted with legs and cues to go to right  elbow then back to side, required hand over hand facilitation    Transfers                   General transfer comment: deferred      Balance Overall balance assessment: Needs assistance Sitting-balance support: Feet unsupported, No upper extremity supported Sitting balance-Leahy Scale: Poor   Postural control: Right lateral lean, Posterior lean  ADL either performed or assessed with clinical judgement   ADL Overall ADL's : Needs assistance/impaired Eating/Feeding: NPO   Grooming: Maximal assistance;Bed  level Grooming Details (indicate cue type and reason): patient was resistive to [facility] for washing face with patient unable to make contact with her face using RUE with increased time. Upper Body Bathing: Total assistance;Bed level   Lower Body Bathing: Total assistance;Bed level   Upper Body Dressing : Bed level;Total assistance   Lower Body Dressing: Bed level;Total assistance   Toilet Transfer: +2 for safety/equipment;+2 for physical assistance Toilet Transfer Details (indicate cue type and reason): patient was +2 for sitting on EOB with increased time. Toileting- Clothing Manipulation and Hygiene: Total assistance;Bed level       Functional mobility during ADLs: +2 for physical assistance;+2 for safety/equipment       Vision Patient Visual Report: No change from baseline       Perception     Praxis      Pertinent Vitals/Pain Pain Assessment Pain Assessment: No/denies pain Faces Pain Scale: No hurt     Hand Dominance Right   Extremity/Trunk Assessment Upper Extremity Assessment Upper Extremity Assessment: Generalized weakness;Difficult to assess due to impaired cognition (AAROM to 90 degrees bilaerally with minimal effort on patients side)   Lower Extremity Assessment Lower Extremity Assessment: Defer to PT evaluation   Cervical / Trunk Assessment Cervical / Trunk Assessment: Other exceptions Cervical / Trunk Exceptions: right head turn preference,   lack of range to rotate to the left, required much cues to  look to the left, only gets to neutral   Communication Communication Communication:  (speach is soft and difficulty to understand at times)   Cognition Arousal/Alertness: Awake/alert Behavior During Therapy: Flat affect Overall Cognitive Status: Impaired/Different from baseline Area of Impairment: Orientation, Attention, Following commands                 Orientation Level: Time, Situation Current Attention Level: Focused   Following Commands:  Follows one step commands inconsistently       General Comments: awake, at times, "spaces out", frequently asks for food. follows with slow responses.no carryover of education for orientation.     General Comments       Exercises     Shoulder Instructions      Home Living Family/patient expects to be discharged to:: Private residence Living Arrangements: Spouse/significant other;Children Available Help at Discharge: Family;Available PRN/intermittently Type of Home: House Home Access: Stairs to enter Entrance Stairs-Number of Steps: 4   Home Layout: Two level;Able to live on main level with bedroom/bathroom Alternate Level Stairs-Number of Steps: 6   Bathroom Shower/Tub: Occupational psychologist: Standard Bathroom Accessibility: Yes   Home Equipment: Conservation officer, nature (2 wheels);BSC/3in1   Additional Comments: Above home info from previous encounter, patient  unable to provide all info. states that she lives w/ daughter.      Prior Functioning/Environment Prior Level of Function : Independent/Modified Independent             Mobility Comments: was ambulatory at DC 2 mos ago ADLs Comments: independent        OT Problem List: Impaired balance (sitting and/or standing);Decreased coordination;Decreased safety awareness;Decreased knowledge of precautions;Decreased strength;Decreased activity tolerance;Decreased range of motion;Decreased cognition;Decreased knowledge of use of DME or AE;Cardiopulmonary status limiting activity;Impaired tone;Impaired UE functional use      OT Treatment/Interventions: Self-care/ADL training;Energy conservation;Therapeutic exercise;DME and/or AE instruction;Neuromuscular education;Therapeutic activities;Patient/family education;Cognitive remediation/compensation;Balance training    OT Goals(Current goals can be found  in the care plan section) Acute Rehab OT Goals Patient Stated Goal: to eat something OT Goal Formulation: Patient  unable to participate in goal setting Time For Goal Achievement: 08/22/22 Potential to Achieve Goals: Fair  OT Frequency: Min 2X/week    Co-evaluation PT/OT/SLP Co-Evaluation/Treatment: Yes Reason for Co-Treatment: For patient/therapist safety;To address functional/ADL transfers PT goals addressed during session: Mobility/safety with mobility OT goals addressed during session: ADL's and self-care      AM-PAC OT "6 Clicks" Daily Activity     Outcome Measure Help from another person eating meals?: Total (NPO) Help from another person taking care of personal grooming?: A Lot Help from another person toileting, which includes using toliet, bedpan, or urinal?: Total Help from another person bathing (including washing, rinsing, drying)?: Total Help from another person to put on and taking off regular upper body clothing?: Total Help from another person to put on and taking off regular lower body clothing?: Total 6 Click Score: 7   End of Session Nurse Communication: Mobility status  Activity Tolerance: Patient limited by fatigue Patient left: in bed;with call bell/phone within reach;with bed alarm set;with restraints reapplied  OT Visit Diagnosis: Unsteadiness on feet (R26.81);Muscle weakness (generalized) (M62.81);Other symptoms and signs involving cognitive function                Time: 0825-0850 OT Time Calculation (min): 25 min Charges:  OT General Charges $OT Visit: 1 Visit OT Evaluation $OT Eval Moderate Complexity: 1 Mod  Ruta Capece OTR/L, MS Acute Rehabilitation Department Office# [phone]   Willa Rough 08/08/2022, 12:00 PM

## 2022-08-09 DIAGNOSIS — F10939 Alcohol use, unspecified with withdrawal, unspecified: Secondary | ICD-10-CM

## 2022-08-09 DIAGNOSIS — K7030 Alcoholic cirrhosis of liver without ascites: Secondary | ICD-10-CM | POA: Diagnosis not present

## 2022-08-09 DIAGNOSIS — K7682 Hepatic encephalopathy: Secondary | ICD-10-CM | POA: Diagnosis not present

## 2022-08-09 DIAGNOSIS — K922 Gastrointestinal hemorrhage, unspecified: Secondary | ICD-10-CM | POA: Diagnosis not present

## 2022-08-09 LAB — PHOSPHORUS: Phosphorus: 4.2 mg/dL (ref 2.5–4.6)

## 2022-08-09 LAB — CBC
HCT: 29.1 % — ABNORMAL LOW (ref 36.0–46.0)
Hemoglobin: 8.6 g/dL — ABNORMAL LOW (ref 12.0–15.0)
MCH: 27.5 pg (ref 26.0–34.0)
MCHC: 29.6 g/dL — ABNORMAL LOW (ref 30.0–36.0)
MCV: 93 fL (ref 80.0–100.0)
Platelets: 76 10*3/uL — ABNORMAL LOW (ref 150–400)
RBC: 3.13 MIL/uL — ABNORMAL LOW (ref 3.87–5.11)
RDW: 21.9 % — ABNORMAL HIGH (ref 11.5–15.5)
WBC: 3.8 10*3/uL — ABNORMAL LOW (ref 4.0–10.5)
nRBC: 0 % (ref 0.0–0.2)

## 2022-08-09 LAB — GLUCOSE, CAPILLARY
Glucose-Capillary: 87 mg/dL (ref 70–99)
Glucose-Capillary: 85 mg/dL (ref 70–99)
Glucose-Capillary: 76 mg/dL (ref 70–99)
Glucose-Capillary: 102 mg/dL — ABNORMAL HIGH (ref 70–99)
Glucose-Capillary: 105 mg/dL — ABNORMAL HIGH (ref 70–99)
Glucose-Capillary: 92 mg/dL (ref 70–99)

## 2022-08-09 LAB — BASIC METABOLIC PANEL
Anion gap: 8 (ref 5–15)
BUN: 7 mg/dL (ref 6–20)
CO2: 22 mmol/L (ref 22–32)
Calcium: 9 mg/dL (ref 8.9–10.3)
Chloride: 112 mmol/L — ABNORMAL HIGH (ref 98–111)
Creatinine, Ser: 0.54 mg/dL (ref 0.44–1.00)
GFR, Estimated: >60 mL/min (ref >60)
Glucose, Bld: 89 mg/dL (ref 70–99)
Potassium: 3.4 mmol/L — ABNORMAL LOW (ref 3.5–5.1)
Sodium: 142 mmol/L (ref 135–145)

## 2022-08-09 LAB — MAGNESIUM: Magnesium: 1.5 mg/dL — ABNORMAL LOW (ref 1.7–2.4)

## 2022-08-09 MED ORDER — POTASSIUM CHLORIDE 10 MEQ/100ML IV SOLN
10.0000 meq | INTRAVENOUS | Status: AC
  Administered 2022-08-09 (×3): 10 meq via INTRAVENOUS
  Filled 2022-08-09 (×3): qty 100

## 2022-08-09 MED ORDER — MAGNESIUM SULFATE 2 GM/50ML IV SOLN
2.0000 g | Freq: Once | INTRAVENOUS | Status: AC
  Administered 2022-08-09: 2 g via INTRAVENOUS
  Filled 2022-08-09: qty 50

## 2022-08-09 NOTE — Plan of Care (Signed)

## 2022-08-09 NOTE — TOC Initial Note (Signed)
Transition of Care [facility]) - Initial/Assessment Note    Patient Details  Name: Joann [name] MRN: [id number] Date of Birth: 10-20-1980  Transition of [facility]) CM/SW Contact:    Benard Halsted, LCSW Phone Number: 08/09/2022, 5:09 PM  Clinical Narrative:                 Patient transferred to [facility]. CSW received request to speak with patient's mother regarding discharge plan. She is requesting SNF placement for patient. CSW explained placement process and likely limited options in network with patient's Medicaid plan. She expressed understanding and reported that patient only has her son who works and goes to school and cannot be at home with her. CSW will follow up on facilities once NG is removed.   Expected Discharge Plan: Skilled Nursing Facility Barriers to Discharge: Continued Medical Work up, SNF Pending bed offer, Insurance Authorization   Patient Goals and CMS Choice Patient states their goals for this hospitalization and ongoing recovery are:: return home CMS Medicare.gov Compare Post Acute Care list provided to:: Patient Represenative (must comment) Choice offered to / list presented to : Parent  Expected Discharge Plan and Services Expected Discharge Plan: [place] In-house Referral: Clinical Social Work Discharge Planning Services: CM Consult Post Acute Care Choice: [place] arrangements for the past 2 months: Apartment                 DME Arranged: N/A DME Agency: NA       HH Arranged: NA [place] Agency: NA        Prior Living Arrangements/Services Living arrangements for the past 2 months: Apartment Lives with:: Adult Children Patient language and need for interpreter reviewed:: Yes Do you feel safe going back to the place where you live?: Yes      Need for Family Participation in Patient Care: Yes (Comment) Care giver support system in place?: Yes (comment) Current home services: Other (comment) (none) Criminal Activity/Legal  Involvement Pertinent to Current Situation/Hospitalization: No - Comment as needed  Activities of Daily Living Home Assistive Devices/Equipment: None ADL Screening (condition at time of admission) Patient's cognitive ability adequate to safely complete daily activities?: Yes Is the patient deaf or have difficulty hearing?: No Does the patient have difficulty seeing, even when wearing glasses/contacts?: No Does the patient have difficulty concentrating, remembering, or making decisions?: No Patient able to express need for assistance with ADLs?: Yes Does the patient have difficulty dressing or bathing?: Yes Independently performs ADLs?: No Communication: Needs assistance Is this a change from baseline?: Change from baseline, expected to last <3 days Dressing (OT): Needs assistance Is this a change from baseline?: Change from baseline, expected to last <3days Grooming: Needs assistance Is this a change from baseline?: Change from baseline, expected to last <3 days Feeding: Independent Bathing: Needs assistance Is this a change from baseline?: Change from baseline, expected to last <3 days Toileting: Needs assistance Is this a change from baseline?: Change from baseline, expected to last <3 days In/Out Bed: Needs assistance Is this a change from baseline?: Change from baseline, expected to last <3 days Walks in Home: Independent Does the patient have difficulty walking or climbing stairs?: No Weakness of Legs: Both Weakness of Arms/Hands: Both  Permission Sought/Granted Permission sought to share information with : Facility Sport and exercise psychologist, Family Supports Permission granted to share information with : Yes, Verbal Permission Granted     Permission granted to share info w AGENCY: SNFs  Emotional Assessment Appearance:: Appears stated age Attitude/Demeanor/Rapport: Unable to Assess Affect (typically observed): Unable to Assess Orientation: : Oriented to Self,  Oriented to Place Alcohol / Substance Use: Alcohol Use Psych Involvement: No (comment)  Admission diagnosis:  Hypokalemia [E87.6] GI bleed [K92.2] Acute upper GI bleed [L38.1] Alcoholic cirrhosis, unspecified whether ascites present ([place]) [K70.30] Patient Active Problem List   Diagnosis Date Noted   GI bleed 07/25/2022   Bleeding esophageal varices ([place]) 07/25/2022   AVM (arteriovenous malformation) of small bowel, acquired with hemorrhage 07/25/2022   Esophageal varices in cirrhosis ([place]) 07/25/2022   Hematemesis 07/25/2022   Acute upper GI bleed 07/01/2022   Sepsis due to methicillin susceptible Staphylococcus aureus (MSSA) with acute organ dysfunction and septic shock ([place]) 07/01/2022   Lactic acidosis 07/01/2022   AKI (acute kidney injury) ([place]) 07/01/2022   Essential hypertension 07/01/2022   Ileus ([place]) 07/01/2022   Ulcer of esophagus without bleeding    Portal hypertensive gastropathy (HCC)    Alcohol withdrawal syndrome with complication ([place])    Alcoholic hepatitis    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[place])    Abnormal LFTs    Alcoholic gastritis with bleeding 04/17/2022   ETOH abuse 04/16/2022   Electrolyte abnormality 02/24/2022   Leukopenia 02/24/2022   Coagulopathy ([place]) 02/24/2022   Severe protein-calorie malnutrition ([place]) 02/24/2022   Anxiety    Symptomatic anemia 12/17/2021   Esophageal varices without bleeding ([place]) 01/75/1025   Alcoholic cirrhosis ([place]) 85/27/7824   Reactive depression    Wernicke's encephalopathy    Transaminitis    Steroid-induced hyperglycemia    Leucocytosis    Macrocytic anemia    Sleep disturbance    Pressure injury of skin 23/53/6144   Alcoholic hepatitis with ascites    Acute respiratory failure [facility])    Palliative care encounter    Elevated bilirubin    Ascites    Advanced care planning/counseling discussion    Goals of care, counseling/discussion    Palliative care by specialist    Hypoalbuminemia     Encephalopathy, hepatic ([place]) 12/24/2019   Alcohol dependence with withdrawal, unspecified ([place]) 10/24/2018   Thrombocytopenia ([place]) 10/24/2018   Alcohol abuse with alcohol-induced mood disorder ([place]) 09/14/2017   PCP:  Elodia Florence., MD Pharmacy:   [id number] [address] [place]Phone: [phone]: [address]Phone: [phone]: [phone]    Social Determinants of Health (SDOH) Interventions    Readmission Risk Interventions    08/09/2022    5:07 PM 07/25/2022    2:28 PM 02/20/2022    2:32 PM  Readmission Risk Prevention Plan  Transportation Screening Complete Complete   PCP or Specialist Appt within 3-5 Days     HRI or HVistaWork Consult for [place]/Counseling     Palliative Care Screening     Medication Review (Press photographer Complete Complete   PCP or Specialist appointment within 3-5 days of discharge Complete Complete   HRI or [place] Complete   SW Recovery Care/Counseling Consult Complete Complete   Palliative Care Screening Not Applicable Not [place]Complete Not Applicable      Information is confidential and restricted. Go to Review Flowsheets to unlock data.

## 2022-08-09 NOTE — Progress Notes (Signed)
PROGRESS NOTE    Joann [name]  GGY:[id number] DOB: 11-02-1980 DOA: 07/25/2022 PCP: Elodia Florence., MD   Chief Complaint  Patient presents with   Hematemesis    Brief Narrative:  41 year old woman who presented to [facility] ED 11/15 with nausea with multiple episodes of hematemesis over several days with RUQ abdominal, weakness, unable to eat in three days, and several days of dark stools. Drinks a gallon of vodka daily, last drink was 11/14.  History of seizures when she tries to quit. PMHx significant for current EtOH abuse, EtOH cirrhosis, DTs w/seizures, hepatic encephalopathy, HTN and anxiety.   Multiple episodes of small dark bloody emesis in ER.  Hgb 9.7, INR 1.6, Plt 76. Admitted to PCCM.  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  Significant hospital events  11/15 Admitted hematemesis, intubated/ EGD> esophageal varices s/p 6 bands, multiple blood products/ txa/ vit k 11/16 very hard to sedate then progressively hypotensive despite decreasing sedation> CTH neg, EEG/ cerebell neg, multiple blood products for coagulapathy/ txa/ vit k, tongue biting/ clenching 11/21 Has been minimally alert until today with intermittent following of commands per overnight RN 11/25 Encephalopathic but now moving extremities, not tracking 11/26 self extubated 11/28 Ongoing dark liquid stool, self-discontinued Flexiseal several times. Hgb 7.0.   Assessment & Plan:   Principal Problem:   GI bleed Active Problems:   Alcohol withdrawal syndrome with complication (HCC)   Acute upper GI bleed   Alcoholic cirrhosis (HCC)   Encephalopathy, hepatic (HCC)   Bleeding esophageal varices (HCC)   AVM (arteriovenous malformation) of small bowel, acquired with hemorrhage   Esophageal varices in cirrhosis ([place])   Hematemesis   Hepatic encephalopathy due to hyperammonemia  .  ICU delirium - CT head NAICA. -  EEG 11/17 neg. - Rifaximin BID - Lactulose TID - Continue Seroquel   Acute hypoxic respiratory failure Intubated for EGD 11/15 and left on MV overnight to monitor for rebleeding of banded varices.  Self extubated 11/26 evening. - PT/OT/SLP following, appreciate - Wean O2 for sat > 90% - Pulmonary hygiene   Upper GIB 2/2 bleeding grade III esophageal varices s/p banding. portal hypertensive gastropathy and duodenum lesion s/p APC and clip EtOH cirrhosis Portal hypertensive gastropathy  Discriminant score 26.3, steroids deferred at admission. EGD 11/15 by Dr. 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 Completed SBP ppx. - Trend CBC - PPI BID - Repeat EGD in 3-4 weeks per GI  - Continue Flexiseal for now until able to toilet herself    High risk for ETOH withdrawal, hx of DTs ETOH abuse  - Monitor for signs/symptoms of withdrawal - Precedex has been discontinued -ongoing, 1 gallon of vodka daily, last drink 11/14, now out of window of withdrawals - Continue Klonopin, thiamine - TOC engagement for cessation counseling/resources   Acute on chronic Anemia - stable Coagulapathy - acute on chronic thrombocytopenia, elevated INR 2 units FFP now with TXA f/b 2 units PRBC 11/15. 1 FFP, 1 platelets, 1 cryo, txa, vit K 11/16. 1 FFP 11/17 Thrombocytopenia - sequestration from advanced liver disease - Trend H&H, Plt - Monitor for signs of active bleeding - Transfuse for Hgb < 7.0, Plt <  20K or hemodynamically significant bleeding   Hypernatremia - 11/25 Hypocalcemia  Hypophos  Hypomagnesia - Trend BMP - Replete electrolytes as indicated (goal K 4, Mg 2) - Monitor I&Os - FWF VT   Hypoglycemia - improved on TF Likely related to liver dysfunction, critical illness, and  NPO status - CBGs Q4H - Goal CBG 140-180   PAF- transient related to critical illness; not AC candidate HTN - Hold home meds at present   Urinary retention - Monitor I&Os - Bladder scan PRN   Moderate Malnutrition related to chronic illness - Appreciate Nutrition/RD consult - Monitor for refeeding syndrome - For MBSS today     DVT prophylaxis: SCD's Code Status: (Full) Family Communication: ( none at bedside) Disposition:   Status is: Inpatient    Consultants:  - GI -PCCM  Subjective:  Patient was confused overnight for which she required restraint, but this morning she is appropriate, she is asking for ice chips  Objective: Vitals:   08/09/22 0300 08/09/22 0549 08/09/22 0803 08/09/22 1153  BP: 106/74 110/76 95/66 99/66   Pulse: (!) 114 (!) 104 77 96  Resp: 20 20 18 16   Temp: 98.8 F (37.1 C)  98.7 F (37.1 C) 98.3 F (36.8 C)  TempSrc: Oral  Axillary Oral  SpO2: 100% 100% 100% 100%  Weight:      Height:        Intake/Output Summary (Last 24 hours) at 08/09/2022 1552 Last data filed at 08/09/2022 1430 Gross per 24 hour  Intake 2098.75 ml  Output 745 ml  Net 1353.75 ml   Filed Weights   08/02/22 0500 08/03/22 0500 08/06/22 1144  Weight: 73.3 kg 69.5 kg 70.5 kg    Examination:  Awake Alert, answering questions appropriately, follow commands, extremely frail, ill-appearing. Symmetrical Chest wall movement, Good air movement bilaterally, CTAB RRR,No Gallops,Rubs or new Murmurs, No Parasternal Heave +ve B.Sounds, Abd Soft, No tenderness, No rebound - guarding or rigidity. No Cyanosis, Clubbing or edema, No new Rash or bruise      Data Reviewed: I have personally reviewed following labs and imaging studies  CBC: Recent Labs  Lab 08/06/22 0259 08/07/22 0618 08/07/22 1222 08/07/22 1848 08/07/22 2313 08/08/22 1135 08/09/22 0520  WBC 4.5 4.5 3.7*  --  4.4  --  3.8*  NEUTROABS  --   --  2.2  --   --   --   --   HGB 8.9* 7.0* 7.9* 7.1* 9.0*   8.8* 9.0* 8.6*  HCT 32.5* 25.4* 28.3* 25.8* 30.1*  29.7* 30.5* 29.1*  MCV 100.3* 99.6 99.0  --  92.6  --  93.0  PLT 69* 73* 76*  --  84*  --  76*    Basic Metabolic Panel: Recent Labs  Lab 08/05/22 0302 08/05/22 1032 08/05/22 1857 08/06/22 0259 08/07/22 0618 08/07/22 2313 08/09/22 0520  NA 155*   < > 147* 148* 144 148* 142  K 4.2  --   --  4.0 4.1 3.6 3.4*  CL 127*  --   --  116* 113* 119* 112*  CO2 23  --   --  24 27 25 22   GLUCOSE 103*  --   --  128* 122* 96 89  BUN 8  --   --  6 12 10 7   CREATININE 0.41*  --   --  <0.30* 0.49 0.48 0.54  CALCIUM 8.9  --   --  8.6* 8.3* 8.5* 9.0  MG 1.9  --  1.6* 1.6* 2.0 1.7 1.5*  PHOS 4.1  --  3.2 2.9 3.4 3.7 4.2   < > = values in this interval not displayed.    GFR: Estimated Creatinine Clearance: 87.3 mL/min (by C-G formula based on SCr of 0.54 mg/dL).  Liver Function Tests: Recent Labs  Lab 08/05/22 0302 08/06/22 0259 08/07/22 2313  AST 57* 61* 51*  ALT 8 9 9   ALKPHOS 105 96 79  BILITOT 2.3* 2.1* 2.1*  PROT 8.6* 8.5* 7.6  ALBUMIN 2.7* 2.7* 2.7*    CBG: Recent Labs  Lab 08/08/22 2020 08/09/22 0100 08/09/22 0344 08/09/22 0809 08/09/22 1215  GLUCAP 95 87 85 76 102*     Recent Results (from the past 240 hour(s))  Culture, Respiratory w Gram Stain     Status: None   Collection Time: 08/05/22 10:46 AM   Specimen: Bronchoalveolar Lavage; Respiratory  Result Value Ref Range Status   Specimen Description   Final    BRONCHIAL ALVEOLAR LAVAGE Performed at [facility] [address], [place], [place] [address]    Special Requests   Final    NONE Performed at [facility], [place] [address] [place], [place] [address]    Gram Stain   Final    MODERATE GRAM POSITIVE RODS RARE GRAM POSITIVE COCCI IN PAIRS RARE GRAM NEGATIVE RODS NO WBC SEEN Performed at [facility], [place] [address], [place], [place] [address]    Culture   Final    FEW CITROBACTER BRAAKII ABUNDANT  STAPHYLOCOCCUS AUREUS    Report Status 08/08/2022 FINAL  Final   Organism ID, Bacteria CITROBACTER BRAAKII  Final   Organism ID, Bacteria STAPHYLOCOCCUS AUREUS  Final      Susceptibility   Citrobacter braakii - MIC*    CEFAZOLIN >=64 RESISTANT Resistant     CEFEPIME 0.5 SENSITIVE Sensitive     CEFTAZIDIME >=64 RESISTANT Resistant     CEFTRIAXONE >=64 RESISTANT Resistant     CIPROFLOXACIN <=0.25 SENSITIVE Sensitive     GENTAMICIN <=1 SENSITIVE Sensitive     IMIPENEM <=0.25 SENSITIVE Sensitive     TRIMETH/SULFA <=20 SENSITIVE Sensitive     PIP/TAZO 64 INTERMEDIATE Intermediate     * FEW CITROBACTER BRAAKII   Staphylococcus aureus - MIC*    CIPROFLOXACIN <=0.5 SENSITIVE Sensitive     ERYTHROMYCIN <=0.25 SENSITIVE Sensitive     GENTAMICIN <=0.5 SENSITIVE Sensitive     OXACILLIN <=0.25 SENSITIVE Sensitive     TETRACYCLINE <=1 SENSITIVE Sensitive     VANCOMYCIN <=0.5 SENSITIVE Sensitive     TRIMETH/SULFA <=10 SENSITIVE Sensitive     CLINDAMYCIN <=0.25 SENSITIVE Sensitive     RIFAMPIN <=0.5 SENSITIVE Sensitive     Inducible Clindamycin NEGATIVE Sensitive     * ABUNDANT STAPHYLOCOCCUS AUREUS         Radiology Studies: DG Abd 1 View  Result Date: 08/08/2022 CLINICAL DATA:  Encounter for nasogastric tube placement. EXAM: ABDOMEN - 1 VIEW COMPARISON:  Radiograph 08/06/2022 FINDINGS: Tip of the weighted enteric tube is in the right upper quadrant, in the region of the distal stomach or proximal duodenum. Nonobstructive included bowel gas pattern. Right upper quadrant surgical clips typical of cholecystectomy. IMPRESSION: Tip of the weighted enteric tube in the right upper quadrant, in the region of the distal stomach or proximal duodenum. Electronically Signed   By: Keith Rake M.D.   On: 08/08/2022 12:35        Scheduled Meds:  Chlorhexidine Gluconate Cloth  6 each Topical Daily   clonazePAM  0.5  mg Per Tube BID   feeding supplement (PROSource TF20)  60 mL Per Tube Daily    folic acid  1 mg Per Tube Daily   free water  300 mL Per Tube Q4H   insulin aspart  0-9 Units Subcutaneous Q4H   lactulose  30 g Per Tube TID   pantoprazole (PROTONIX) IV  40 mg Intravenous Q12H   QUEtiapine  25 mg Per Tube BID   rifaximin  550 mg Per Tube BID   sodium chloride flush  10-40 mL Intracatheter Q12H   zinc sulfate  220 mg Per Tube BID   Continuous Infusions:  sodium chloride 10 mL/hr (08/01/22 0615)   feeding supplement (VITAL 1.5 CAL) 20 mL/hr at 08/09/22 0406     LOS: 15 days      Phillips Climes, MD [facility]   To contact the attending provider between 7A-7P or the covering provider during after hours 7P-7A, please log into the web site [email or web address] and access using universal [place] password for that web site. If you do not have the password, please call the hospital operator.  08/09/2022, 3:52 PM

## 2022-08-10 LAB — BASIC METABOLIC PANEL
Anion gap: 11 (ref 5–15)
BUN: 5 mg/dL — ABNORMAL LOW (ref 6–20)
CO2: 22 mmol/L (ref 22–32)
Calcium: 8.7 mg/dL — ABNORMAL LOW (ref 8.9–10.3)
Chloride: 105 mmol/L (ref 98–111)
Creatinine, Ser: 0.49 mg/dL (ref 0.44–1.00)
GFR, Estimated: >60 mL/min (ref >60)
Glucose, Bld: 103 mg/dL — ABNORMAL HIGH (ref 70–99)
Potassium: 3.5 mmol/L (ref 3.5–5.1)
Sodium: 138 mmol/L (ref 135–145)

## 2022-08-10 LAB — GLUCOSE, CAPILLARY
Glucose-Capillary: 118 mg/dL — ABNORMAL HIGH (ref 70–99)
Glucose-Capillary: 123 mg/dL — ABNORMAL HIGH (ref 70–99)
Glucose-Capillary: 129 mg/dL — ABNORMAL HIGH (ref 70–99)
Glucose-Capillary: 133 mg/dL — ABNORMAL HIGH (ref 70–99)
Glucose-Capillary: 94 mg/dL (ref 70–99)
Glucose-Capillary: 98 mg/dL (ref 70–99)
Glucose-Capillary: 98 mg/dL (ref 70–99)

## 2022-08-10 LAB — CBC
HCT: 30.1 % — ABNORMAL LOW (ref 36.0–46.0)
Hemoglobin: 9.3 g/dL — ABNORMAL LOW (ref 12.0–15.0)
MCH: 27.9 pg (ref 26.0–34.0)
MCHC: 30.9 g/dL (ref 30.0–36.0)
MCV: 90.4 fL (ref 80.0–100.0)
Platelets: 95 10*3/uL — ABNORMAL LOW (ref 150–400)
RBC: 3.33 MIL/uL — ABNORMAL LOW (ref 3.87–5.11)
RDW: 21 % — ABNORMAL HIGH (ref 11.5–15.5)
WBC: 3.6 10*3/uL — ABNORMAL LOW (ref 4.0–10.5)
nRBC: 0 % (ref 0.0–0.2)

## 2022-08-10 LAB — AMMONIA: Ammonia: 71 umol/L — ABNORMAL HIGH (ref 9–35)

## 2022-08-10 LAB — MAGNESIUM: Magnesium: 1.5 mg/dL — ABNORMAL LOW (ref 1.7–2.4)

## 2022-08-10 LAB — PHOSPHORUS: Phosphorus: 3.4 mg/dL (ref 2.5–4.6)

## 2022-08-10 MED ORDER — QUETIAPINE FUMARATE 25 MG PO TABS
25.0000 mg | ORAL_TABLET | Freq: Every day | ORAL | Status: DC
  Administered 2022-08-10: 25 mg
  Filled 2022-08-10: qty 1

## 2022-08-10 MED ORDER — MAGNESIUM SULFATE 2 GM/50ML IV SOLN
2.0000 g | Freq: Once | INTRAVENOUS | Status: AC
  Administered 2022-08-10: 2 g via INTRAVENOUS
  Filled 2022-08-10: qty 50

## 2022-08-10 NOTE — Progress Notes (Addendum)
SLP Cancellation Note  Patient Details Name: Joann [name] MRN: [id number] DOB: November 21, 1980   Cancelled treatment:       Reason Eval/Treat Not Completed: Patient at procedure or test/unavailable  Attempted to see pt to assess diet tolerance, however, pt is currently working with PT. Pt currently on Dys3/thin liquids. RN indicated pt is requesting a regular diet. Will continue efforts.   Davyn Elsasser B. Quentin Ore, [facility], [place] Speech Language Pathologist Office: [phone]  [name] 08/10/2022, 3:06 PM

## 2022-08-10 NOTE — Progress Notes (Signed)
Physical Therapy Treatment Patient Details Name: Joann [name] MRN: [id number] DOB: 10-27-1980 Today's Date: 08/10/2022   History of Present Illness Patient is a 41 year old female who presented to the hospital on 10/10 with fatigue, confusion and weakness trying to detox at home. patient was found to be in septic shock, alcohol withdrawl with delirium, acute upper GI bleed, ileus, and AKI.  10/12 patient was transfused 1 unit PRBCs. patient underwent EGD 0n 10/17 with discovery of ulcer in esophagus and small varix. NG tube was removed on 10/21 and patient was transitioned to medical floor.   PMH: alcohol cirrhosis, HTN, esophageal varices, hepatic encephalopathy,    PT Comments    Pt was assisted to chair after work on sitting balance control and then standing balance awareness with HHA.  Pt is unable to get fully upright but is a bit lethargic and unfocused, requiring repetitive cues to continue to sidestep to chair.  Will recommend her to get SNF care due to depth of deficits and cognitive focus issues.  Will allow more time in a multidisciplinary setting to recover controlled balance and strength for quality of gait and balance.  Follow as tolerated for all mobility and encourage OOB in chair with staff to assist back to bed.  Contacted nursing to make them aware pt was up with flexi out.   Recommendations for follow up therapy are one component of a multi-disciplinary discharge planning process, led by the attending physician.  Recommendations may be updated based on patient status, additional functional criteria and insurance authorization.  Follow Up Recommendations  Skilled nursing-short term rehab (<3 hours/day) Can patient physically be transported by private vehicle: No   Assistance Recommended at Discharge Frequent or constant Supervision/Assistance  Patient can return home with the following A lot of help with walking and/or transfers;A lot of help with  bathing/dressing/bathroom;Assistance with cooking/housework;Assist for transportation;Help with stairs or ramp for entrance   Equipment Recommendations  None recommended by PT    Recommendations for Other Services       Precautions / Restrictions Precautions Precautions: Fall Precaution Comments: flexiseal Restrictions Weight Bearing Restrictions: No     Mobility  Bed Mobility Overal bed mobility: Needs Assistance Bed Mobility: Supine to Sit     Supine to sit: Mod assist     General bed mobility comments: mod assist to get up to side of bed and scoot closely    Transfers Overall transfer level: Needs assistance Equipment used: 1 person hand held assist Transfers: Bed to chair/wheelchair/BSC, Sit to/from Stand Sit to Stand: Mod assist   Step pivot transfers: Mod assist       General transfer comment: repetitive cues for standing and sidestepping    Ambulation/Gait Ambulation/Gait assistance: Mod assist Gait Distance (Feet): 6 Feet Assistive device: 1 person hand held assist Gait Pattern/deviations: Step-to pattern, Decreased stride length, Wide base of support Gait velocity: reduced Gait velocity interpretation: <1.31 ft/sec, indicative of household ambulator Pre-gait activities: pt is demonstrating difficulty fully extending and standing up General Gait Details: pt is up to stand with help and noted she had lost her flexiseal, and notified nursing.   Stairs             Wheelchair Mobility    Modified Rankin (Stroke Patients Only)       Balance Overall balance assessment: Needs assistance Sitting-balance support: Feet supported Sitting balance-Leahy Scale: Fair     Standing balance support: Bilateral upper extremity supported, During functional activity Standing balance-Leahy Scale: Poor Standing balance  comment: repetitive instructions for taking steps to finish the task                            Cognition Arousal/Alertness:  Awake/alert Behavior During Therapy: Flat affect Overall Cognitive Status: Impaired/Different from baseline Area of Impairment: Problem solving, Awareness, Safety/judgement, Following commands, Memory, Attention, Orientation                 Orientation Level: Time, Situation Current Attention Level: Selective Memory: Decreased recall of precautions, Decreased short-term memory Following Commands: Follows one step commands with increased time Safety/Judgement: Decreased awareness of deficits, Decreased awareness of safety Awareness: Anticipatory, Intellectual Problem Solving: Slow processing, Difficulty sequencing, Requires verbal cues, Requires tactile cues General Comments: Pt is aware of her lines only part of the time, wants to reach and pull at things, and noted flexiseal is expelled when PT is up to side of bed        Exercises      General Comments General comments (skin integrity, edema, etc.): pt is up to walk with PT to chair and repositioned to get her into safe situation with chair pad added.      Pertinent Vitals/Pain Pain Assessment Pain Assessment: Faces Faces Pain Scale: Hurts a little bit Pain Location: peri area over flexiseal Pain Intervention(s): Repositioned, Monitored during session    Home Living                          Prior Function            PT Goals (current goals can now be found in the care plan section) Acute Rehab PT Goals Patient Stated Goal: to get home soon Progress towards PT goals: Progressing toward goals    Frequency    Min 2X/week      PT Plan Current plan remains appropriate    Co-evaluation              AM-PAC PT "6 Clicks" Mobility   Outcome Measure  Help needed turning from your back to your side while in a flat bed without using bedrails?: A Lot Help needed moving from lying on your back to sitting on the side of a flat bed without using bedrails?: A Lot Help needed moving to and from a bed  to a chair (including a wheelchair)?: A Lot Help needed standing up from a chair using your arms (e.g., wheelchair or bedside chair)?: A Lot Help needed to walk in hospital room?: Total Help needed climbing 3-5 steps with a railing? : Total 6 Click Score: 10    End of Session Equipment Utilized During Treatment: Gait belt Activity Tolerance: Patient tolerated treatment well;Patient limited by fatigue Patient left: with call bell/phone within reach;in chair;with chair alarm set Nurse Communication: Mobility status PT Visit Diagnosis: Unsteadiness on feet (R26.81);Muscle weakness (generalized) (M62.81);Difficulty in walking, not elsewhere classified (R26.2)     Time: 5621-3086 PT Time Calculation (min) (ACUTE ONLY): 29 min  Charges:  $Therapeutic Activity: 23-37 mins   Ramond Dial 08/10/2022, 5:26 PM  Mee Hives, PT PhD Acute Rehab Dept. Number: [place] [facility] [place]

## 2022-08-10 NOTE — Progress Notes (Signed)
PROGRESS NOTE    Joann [name]  WCB:[id number] DOB: 07-01-1981 DOA: 07/25/2022 PCP: Elodia Florence., MD   Chief Complaint  Patient presents with   Hematemesis    Brief Narrative:  41 year old woman who presented to [facility] ED 11/15 with nausea with multiple episodes of hematemesis over several days with RUQ abdominal, weakness, unable to eat in three days, and several days of dark stools. Drinks a gallon of vodka daily, last drink was 11/14.  History of seizures when she tries to quit. PMHx significant for current EtOH abuse, EtOH cirrhosis, DTs w/seizures, hepatic encephalopathy, HTN and anxiety.   Multiple episodes of small dark bloody emesis in ER.  Hgb 9.7, INR 1.6, Plt 76. Admitted to PCCM.  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  Significant hospital events  11/15 Admitted hematemesis, intubated/ EGD> esophageal varices s/p 6 bands, multiple blood products/ txa/ vit k 11/16 very hard to sedate then progressively hypotensive despite decreasing sedation> CTH neg, EEG/ cerebell neg, multiple blood products for coagulapathy/ txa/ vit k, tongue biting/ clenching 11/21 Has been minimally alert until today with intermittent following of commands per overnight RN 11/25 Encephalopathic but now moving extremities, not tracking 11/26 self extubated 11/28 Ongoing dark liquid stool, self-discontinued Flexiseal several times. Hgb 7.0.   Assessment & Plan:   Principal Problem:   GI bleed Active Problems:   Alcohol withdrawal syndrome with complication (HCC)   Acute upper GI bleed   Alcoholic cirrhosis (HCC)   Encephalopathy, hepatic (HCC)   Bleeding esophageal varices (HCC)   AVM (arteriovenous malformation) of small bowel, acquired with hemorrhage   Esophageal varices in cirrhosis ([place])   Hematemesis   Hepatic encephalopathy due to hyperammonemia  . ICU delirium -CT head negative, no acute findings -EEG 11/17 with no evidence of seizures -Ammonia level trending down, continue with lactulose at the current dose, continue with rifaximin. -She is more appropriate today, will change her Seroquel to evening time only   Acute hypoxic respiratory failure - Intubated for EGD 11/15 and left on MV overnight to monitor for rebleeding of banded varices.  Self extubated 11/26 evening. - PT/OT/SLP following, appreciate - Pulmonary hygiene -Has resolved, she is on room air today   Upper GIB 2/2 bleeding grade III esophageal varices  Acute blood loss anemia  Portal hypertensive gastropathy and duodenal lesion  -Input greatly appreciated, status post banding, and status post APC and clipping of duodenal lesion. -  EGD 11/15 by Dr. 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 Completed SBP ppx. -Repeat EEG in 3 to 4 weeks -Continue to monitor CBC closely, transfuse as needed  Alcoholic liver cirrhosis Coagulopathy Acute on chronic thrombocytopenia - 2 units FFP now with TXA f/b 2 units PRBC 11/15. 1 FFP, 1 platelets, 1 cryo, txa, vit K 11/16. 1 FFP 11/17 - Monitor for signs of active bleeding - Transfuse for Hgb < 7.0, Plt < 20K or hemodynamically significant bleeding  High risk for ETOH withdrawal, hx of DTs ETOH abuse  - Monitor for signs/symptoms of withdrawal - Precedex has been discontinued -ongoing, 1 gallon of vodka daily, last drink 11/14, now out of window of withdrawals - Continue Klonopin, thiamine -  TOC engagement for cessation counseling/resources   Hypernatremia  Hypocalcemia  Hypophos  Hypomagnesia - Trend BMP - Replete electrolytes as indicated (goal K 4, Mg 2)   Hypoglycemia  - improved on TF -Team oral intake.   PAF - transient related  to critical illness; not AC candidate  HTN - Hold home meds at present   Urinary retention - Monitor I&Os - Bladder scan PRN   Moderate Malnutrition related to chronic illness - Appreciate Nutrition/RD consult - Monitor for refeeding syndrome      DVT prophylaxis: SCD's, no pharmacological prophy DVT prophylaxis due to coagulopathy, thrombocytopenia and GI bleed Code Status: (Full) Family Communication: ( none at bedside) Disposition:   Status is: Inpatient    Consultants:  - GI -PCCM  Subjective:  Tolerating oral intake, appetite has improved, patient reports she is feeling better today.  Objective: Vitals:   08/09/22 2035 08/10/22 0515 08/10/22 0747 08/10/22 1230  BP: 106/71 97/71 93/63  108/76  Pulse: 88 86 94 91  Resp: 14 19 18 18   Temp: 98.3 F (36.8 C) 98.4 F (36.9 C) 97.6 F (36.4 C) 98.4 F (36.9 C)  TempSrc: Oral Oral Oral Oral  SpO2: 99% 95% 97% 99%  Weight:      Height:        Intake/Output Summary (Last 24 hours) at 08/10/2022 1355 Last data filed at 08/10/2022 1200 Gross per 24 hour  Intake 770 ml  Output 1300 ml  Net -530 ml   Filed Weights   08/02/22 0500 08/03/22 0500 08/06/22 1144  Weight: 73.3 kg 69.5 kg 70.5 kg    Examination:  Awake Alert, Oriented X 3, is more appropriate, and coherent today, she is frail, ill-appearing.  Symmetrical Chest wall movement, Good air movement bilaterally, CTAB RRR,No Gallops,Rubs or new Murmurs, No Parasternal Heave +ve B.Sounds, Abd Soft, No tenderness, No rebound - guarding or rigidity. No Cyanosis, Clubbing or edema, No new Rash or bruise      Data Reviewed: I have personally reviewed following labs and imaging studies  CBC: Recent Labs  Lab 08/07/22 0618 08/07/22 1222 08/07/22 1848 08/07/22 2313 08/08/22 1135 08/09/22 0520 08/10/22 0356  WBC 4.5 3.7*  --  4.4  --  3.8* 3.6*  NEUTROABS  --  2.2  --   --   --   --   --   HGB 7.0* 7.9* 7.1* 9.0*  8.8* 9.0* 8.6* 9.3*  HCT 25.4*  28.3* 25.8* 30.1*  29.7* 30.5* 29.1* 30.1*  MCV 99.6 99.0  --  92.6  --  93.0 90.4  PLT 73* 76*  --  84*  --  76* 95*    Basic Metabolic Panel: Recent Labs  Lab 08/06/22 0259 08/07/22 0618 08/07/22 2313 08/09/22 0520 08/10/22 0356  NA 148* 144 148* 142 138  K 4.0 4.1 3.6 3.4* 3.5  CL 116* 113* 119* 112* 105  CO2 24 27 25 22 22   GLUCOSE 128* 122* 96 89 103*  BUN 6 12 10 7  5*  CREATININE <0.30* 0.49 0.48 0.54 0.49  CALCIUM 8.6* 8.3* 8.5* 9.0 8.7*  MG 1.6* 2.0 1.7 1.5* 1.5*  PHOS 2.9 3.4 3.7 4.2 3.4    GFR: Estimated Creatinine Clearance: 87.3 mL/min (by C-G formula based on SCr of 0.49 mg/dL).  Liver Function Tests: Recent Labs  Lab 08/05/22 0302 08/06/22 0259 08/07/22 2313  AST 57* 61* 51*  ALT 8 9 9   ALKPHOS 105 96 79  BILITOT 2.3* 2.1* 2.1*  PROT 8.6* 8.5* 7.6  ALBUMIN  2.7* 2.7* 2.7*    CBG: Recent Labs  Lab 08/09/22 2010 08/10/22 0043 08/10/22 0456 08/10/22 0743 08/10/22 1223  GLUCAP 92 98 98 133* 129*     Recent Results (from the past 240 hour(s))  Culture, Respiratory w Gram Stain     Status: None   Collection Time: 08/05/22 10:46 AM   Specimen: Bronchoalveolar Lavage; Respiratory  Result Value Ref Range Status   Specimen Description   Final    BRONCHIAL ALVEOLAR LAVAGE Performed at [facility] [address], [place], [place] [address]    Special Requests   Final    NONE Performed at [facility], [place] [address] [place], [place] [address]    Gram Stain   Final    MODERATE GRAM POSITIVE RODS RARE GRAM POSITIVE COCCI IN PAIRS RARE GRAM NEGATIVE RODS NO WBC SEEN Performed at [facility], [place] [address], [place], [place] [address]    Culture   Final    FEW CITROBACTER BRAAKII ABUNDANT STAPHYLOCOCCUS AUREUS    Report Status 08/08/2022 FINAL  Final   Organism ID, Bacteria CITROBACTER BRAAKII  Final   Organism ID, Bacteria STAPHYLOCOCCUS AUREUS  Final      Susceptibility   Citrobacter  braakii - MIC*    CEFAZOLIN >=64 RESISTANT Resistant     CEFEPIME 0.5 SENSITIVE Sensitive     CEFTAZIDIME >=64 RESISTANT Resistant     CEFTRIAXONE >=64 RESISTANT Resistant     CIPROFLOXACIN <=0.25 SENSITIVE Sensitive     GENTAMICIN <=1 SENSITIVE Sensitive     IMIPENEM <=0.25 SENSITIVE Sensitive     TRIMETH/SULFA <=20 SENSITIVE Sensitive     PIP/TAZO 64 INTERMEDIATE Intermediate     * FEW CITROBACTER BRAAKII   Staphylococcus aureus - MIC*    CIPROFLOXACIN <=0.5 SENSITIVE Sensitive     ERYTHROMYCIN <=0.25 SENSITIVE Sensitive     GENTAMICIN <=0.5 SENSITIVE Sensitive     OXACILLIN <=0.25 SENSITIVE Sensitive     TETRACYCLINE <=1 SENSITIVE Sensitive     VANCOMYCIN <=0.5 SENSITIVE Sensitive     TRIMETH/SULFA <=10 SENSITIVE Sensitive     CLINDAMYCIN <=0.25 SENSITIVE Sensitive     RIFAMPIN <=0.5 SENSITIVE Sensitive     Inducible Clindamycin NEGATIVE Sensitive     * ABUNDANT STAPHYLOCOCCUS AUREUS         Radiology Studies: No results found.      Scheduled Meds:  Chlorhexidine Gluconate Cloth  6 each Topical Daily   clonazePAM  0.5 mg Per Tube BID   feeding supplement (PROSource TF20)  60 mL Per Tube Daily   folic acid  1 mg Per Tube Daily   insulin aspart  0-9 Units Subcutaneous Q4H   lactulose  30 g Per Tube TID   pantoprazole (PROTONIX) IV  40 mg Intravenous Q12H   QUEtiapine  25 mg Per Tube QHS   rifaximin  550 mg Per Tube BID   sodium chloride flush  10-40 mL Intracatheter Q12H   zinc sulfate  220 mg Per Tube BID   Continuous Infusions:  sodium chloride 10 mL/hr (08/01/22 0615)   feeding supplement (VITAL 1.5 CAL) 20 mL/hr at 08/09/22 0406     LOS: 58 days      Phillips Climes, MD [facility]   To contact the attending provider between 7A-7P or the covering provider during after hours 7P-7A, please log into the web site [email or web address] and access using universal [place] password for that web site. If you do not have the password, please call  the  hospital operator.  08/10/2022, 1:55 PM

## 2022-08-10 NOTE — TOC Progression Note (Signed)
Transition of Care [facility]) - Progression Note    Patient Details  Name: ANUM PALECEK MRN: [id number] Date of Birth: 1980/12/05  [facility]) CM/SW [place], LCSW Phone Number: 08/10/2022, 5:15 PM  Clinical Narrative:    CSW met with patient today as she is now oriented. CSW provided supportive listening as patient shared how she is ready to return home. CSW asked patient about SNF recommendation and she is adamantly denying SNF, stating she is too young for that. 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 She has tried many of the facilities before and reported [facility] denied her due to seizure risk. She accepted community resources. CSW discussed her mother's request that she be placed in a SNF. Patient became upset and stated her mother is nosy and is never "in her corner". She requested CSW not provide any info to her mother and that she wants her husband Celesta Gentile and her daughter Rob Bunting to make decisions for her if she is ever unable. She reported her spouse is a Administrator. CSW encouraged her to speak with her family about her discharge plan since she does not want to go to SNF but she needs to be strong enough to return home. She reported understanding and appreciated CSW's assistance and she is going to look into the alcohol rehab places.    Expected Discharge Plan: OP Rehab Barriers to Discharge: Continued Medical Work up  Expected Discharge Plan and Services Expected Discharge Plan: OP Rehab In-house Referral: Clinical Social Work Discharge Planning Services: CM Consult Post Acute Care Choice: [place] Living arrangements for the past 2 months: Apartment                 DME Arranged: N/A DME Agency: NA       HH Arranged: NA [place] Agency: NA         Social Determinants of Health (SDOH) Interventions    Readmission Risk Interventions    08/09/2022    5:07 PM 07/25/2022    2:28  PM 02/20/2022    2:32 PM  Readmission Risk Prevention Plan  Transportation Screening Complete Complete   PCP or Specialist Appt within 3-5 Days     HRI or [place] Work Consult for [place] Planning/Counseling     Palliative Care Screening     Medication Review Press photographer) Complete Complete   PCP or Specialist appointment within 3-5 days of discharge Complete Complete   HRI or [place] Complete Complete   SW Recovery Care/Counseling Consult Complete Complete   Palliative Care Screening Not Applicable Not [place] Complete Not Applicable      Information is confidential and restricted. Go to Review Flowsheets to unlock data.

## 2022-08-11 LAB — BASIC METABOLIC PANEL
Anion gap: 7 (ref 5–15)
BUN: <5 mg/dL — ABNORMAL LOW (ref 6–20)
CO2: 21 mmol/L — ABNORMAL LOW (ref 22–32)
Calcium: 8.2 mg/dL — ABNORMAL LOW (ref 8.9–10.3)
Chloride: 109 mmol/L (ref 98–111)
Creatinine, Ser: 0.55 mg/dL (ref 0.44–1.00)
GFR, Estimated: >60 mL/min (ref >60)
Glucose, Bld: 96 mg/dL (ref 70–99)
Potassium: 3.2 mmol/L — ABNORMAL LOW (ref 3.5–5.1)
Sodium: 137 mmol/L (ref 135–145)

## 2022-08-11 LAB — TYPE AND SCREEN
ABO/RH(D): O POS
Antibody Screen: NEGATIVE

## 2022-08-11 LAB — CBC
HCT: 29.3 % — ABNORMAL LOW (ref 36.0–46.0)
Hemoglobin: 9.1 g/dL — ABNORMAL LOW (ref 12.0–15.0)
MCH: 28.2 pg (ref 26.0–34.0)
MCHC: 31.1 g/dL (ref 30.0–36.0)
MCV: 90.7 fL (ref 80.0–100.0)
Platelets: 105 10*3/uL — ABNORMAL LOW (ref 150–400)
RBC: 3.23 MIL/uL — ABNORMAL LOW (ref 3.87–5.11)
RDW: 20.6 % — ABNORMAL HIGH (ref 11.5–15.5)
WBC: 4.4 10*3/uL (ref 4.0–10.5)
nRBC: 0 % (ref 0.0–0.2)

## 2022-08-11 LAB — MAGNESIUM: Magnesium: 1.4 mg/dL — ABNORMAL LOW (ref 1.7–2.4)

## 2022-08-11 LAB — PHOSPHORUS: Phosphorus: 3.2 mg/dL (ref 2.5–4.6)

## 2022-08-11 LAB — GLUCOSE, CAPILLARY
Glucose-Capillary: 101 mg/dL — ABNORMAL HIGH (ref 70–99)
Glucose-Capillary: 103 mg/dL — ABNORMAL HIGH (ref 70–99)
Glucose-Capillary: 115 mg/dL — ABNORMAL HIGH (ref 70–99)
Glucose-Capillary: 89 mg/dL (ref 70–99)
Glucose-Capillary: 119 mg/dL — ABNORMAL HIGH (ref 70–99)

## 2022-08-11 LAB — AMMONIA: Ammonia: 65 umol/L — ABNORMAL HIGH (ref 9–35)

## 2022-08-11 MED ORDER — NALOXONE HCL 0.4 MG/ML IJ SOLN: 0.4000 mg | INTRAMUSCULAR | Status: DC | PRN

## 2022-08-11 MED ORDER — MAGNESIUM SULFATE 4 GM/100ML IV SOLN
4.0000 g | Freq: Once | INTRAVENOUS | Status: AC
  Administered 2022-08-11: 4 g via INTRAVENOUS
  Filled 2022-08-11: qty 100

## 2022-08-11 MED ORDER — ONDANSETRON HCL 4 MG/2ML IJ SOLN
4.0000 mg | Freq: Four times a day (QID) | INTRAMUSCULAR | Status: DC | PRN
  Administered 2022-08-11 – 2022-08-12 (×2): 4 mg via INTRAVENOUS
  Filled 2022-08-11 (×2): qty 2

## 2022-08-11 MED ORDER — FENTANYL CITRATE PF 50 MCG/ML IJ SOSY: 25.0000 ug | PREFILLED_SYRINGE | INTRAMUSCULAR | Status: DC | PRN

## 2022-08-11 MED ORDER — CLONAZEPAM 0.25 MG PO TBDP: 0.2500 mg | ORAL_TABLET | Freq: Every day | ORAL | Status: DC

## 2022-08-11 MED ORDER — POTASSIUM CHLORIDE CRYS ER 20 MEQ PO TBCR
40.0000 meq | EXTENDED_RELEASE_TABLET | Freq: Four times a day (QID) | ORAL | Status: AC
  Administered 2022-08-11: 40 meq via ORAL
  Filled 2022-08-11: qty 2

## 2022-08-11 MED ORDER — LACTULOSE 10 GM/15ML PO SOLN
30.0000 g | Freq: Three times a day (TID) | ORAL | Status: DC
  Administered 2022-08-12 (×3): 30 g via ORAL
  Filled 2022-08-11 (×4): qty 60

## 2022-08-11 MED ORDER — FOLIC ACID 1 MG PO TABS
1.0000 mg | ORAL_TABLET | Freq: Every day | ORAL | Status: DC
  Administered 2022-08-12 – 2022-08-13 (×2): 1 mg via ORAL
  Filled 2022-08-11 (×2): qty 1

## 2022-08-11 MED ORDER — PROPRANOLOL HCL 20 MG PO TABS
20.0000 mg | ORAL_TABLET | Freq: Two times a day (BID) | ORAL | Status: DC
  Administered 2022-08-12 – 2022-08-13 (×2): 20 mg via ORAL
  Filled 2022-08-11 (×5): qty 1

## 2022-08-11 MED ORDER — RIFAXIMIN 550 MG PO TABS
550.0000 mg | ORAL_TABLET | Freq: Two times a day (BID) | ORAL | Status: DC
  Administered 2022-08-11 – 2022-08-13 (×4): 550 mg via ORAL
  Filled 2022-08-11 (×4): qty 1

## 2022-08-11 MED ORDER — QUETIAPINE FUMARATE 25 MG PO TABS
25.0000 mg | ORAL_TABLET | Freq: Every day | ORAL | Status: DC
  Administered 2022-08-11: 25 mg via ORAL
  Filled 2022-08-11: qty 1

## 2022-08-11 MED ORDER — CLONAZEPAM 0.25 MG PO TBDP
0.2500 mg | ORAL_TABLET | Freq: Every day | ORAL | Status: DC
  Administered 2022-08-11 – 2022-08-12 (×2): 0.25 mg via ORAL
  Filled 2022-08-11 (×2): qty 1

## 2022-08-11 NOTE — Progress Notes (Signed)
PROGRESS NOTE    Joann [name]  FBP:[id number] DOB: Aug 16, 1981 DOA: 07/25/2022 PCP: Elodia Florence., MD   Chief Complaint  Patient presents with   Hematemesis    Brief Narrative:  41 year old woman who presented to [facility] ED 11/15 with nausea with multiple episodes of hematemesis over several days with RUQ abdominal, weakness, unable to eat in three days, and several days of dark stools. Drinks a gallon of vodka daily, last drink was 11/14.  History of seizures when she tries to quit. PMHx significant for current EtOH abuse, EtOH cirrhosis, DTs w/seizures, hepatic encephalopathy, HTN and anxiety.   Multiple episodes of small dark bloody emesis in ER.  Hgb 9.7, INR 1.6, Plt 76. Admitted to PCCM.  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  Significant hospital events  11/15 Admitted hematemesis, intubated/ EGD> esophageal varices s/p 6 bands, multiple blood products/ txa/ vit k 11/16 very hard to sedate then progressively hypotensive despite decreasing sedation> CTH neg, EEG/ cerebell neg, multiple blood products for coagulapathy/ txa/ vit k, tongue biting/ clenching 11/21 Has been minimally alert until today with intermittent following of commands per overnight RN 11/25 Encephalopathic but now moving extremities, not tracking 11/26 self extubated 11/28 Ongoing dark liquid stool, self-discontinued Flexiseal several times. Hgb 7.0.   Assessment & Plan:   Principal Problem:   GI bleed Active Problems:   Alcohol withdrawal syndrome with complication (HCC)   Acute upper GI bleed   Alcoholic cirrhosis (HCC)   Encephalopathy, hepatic (HCC)   Bleeding esophageal varices (HCC)   AVM (arteriovenous malformation) of small bowel, acquired with hemorrhage   Esophageal varices in cirrhosis ([place])   Hematemesis   Hepatic encephalopathy due to hyperammonemia  . ICU delirium -CT head negative, no acute findings -EEG 11/17 with no evidence of seizures -Ammonia level trending down, continue with lactulose at the current dose, continue with rifaximin. -She is more appropriate today, will change her Seroquel to evening time only -Will decrease her Klonopin dose as well   Acute hypoxic respiratory failure - Intubated for EGD 11/15 and left on MV overnight to monitor for rebleeding of banded varices.  Self extubated 11/26 evening. - PT/OT/SLP following, appreciate - Pulmonary hygiene -Has resolved, she is on room air today   Upper GIB 2/2 bleeding grade III esophageal varices  Acute blood loss anemia  Portal hypertensive gastropathy and duodenal lesion  -Input greatly appreciated, status post banding, and status post APC and clipping of duodenal lesion. -  EGD 11/15 by Dr. 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 Completed SBP ppx. -Repeat EGD in 3 to 4 weeks -Continue to monitor CBC closely, transfuse as needed -Will start on low-dose propranolol.  Alcoholic liver cirrhosis Coagulopathy Acute on chronic thrombocytopenia - 2 units FFP now with TXA f/b 2 units PRBC 11/15. 1 FFP, 1 platelets, 1 cryo, txa, vit K 11/16. 1 FFP 11/17 - Monitor for signs of active bleeding - Transfuse for Hgb < 7.0, Plt < 20K or hemodynamically significant bleeding  High risk for ETOH withdrawal, hx of DTs ETOH abuse  - Monitor for signs/symptoms of withdrawal - Precedex has been discontinued -ongoing, 1 gallon of vodka daily, last drink  11/14, now out of window of withdrawals - Continue Klonopin, thiamine - TOC engagement for cessation counseling/resources   Hypernatremia  Hypocalcemia  Hypophos  Hypomagnesia - Trend BMP - Replete electrolytes as indicated (goal K 4, Mg 2)    Hypoglycemia  - improved on TF -Team oral intake.   PAF - transient related to critical illness; not AC candidate  HTN - Hold home meds at present   Urinary retention - Monitor I&Os - Bladder scan PRN   Moderate Malnutrition related to chronic illness - Appreciate Nutrition/RD consult - Monitor for refeeding syndrome    Hypokalemia Hypomagnesemia -Repleted, will recheck in a.m.  DVT prophylaxis: SCD's, no pharmacological prophy DVT prophylaxis due to coagulopathy, thrombocytopenia and GI bleed Code Status: (Full) Family Communication: ( none at bedside) asked with husband by phone Disposition:   Status is: Inpatient    Consultants:  - GI -PCCM  Subjective:  No significant events overnight, denies any complaints today  Objective: Vitals:   08/11/22 0452 08/11/22 0455 08/11/22 0759 08/11/22 1215  BP: 94/60  116/77 108/76  Pulse: 86  (!) 108 (!) 103  Resp:   19 20  Temp: 98.6 F (37 C)  98.7 F (37.1 C) 98.5 F (36.9 C)  TempSrc: Oral  Oral   SpO2: 98%  96% 97%  Weight:  73.6 kg    Height:        Intake/Output Summary (Last 24 hours) at 08/11/2022 1513 Last data filed at 08/11/2022 0805 Gross per 24 hour  Intake 600 ml  Output 1600 ml  Net -1000 ml   Filed Weights   08/03/22 0500 08/06/22 1144 08/11/22 0455  Weight: 69.5 kg 70.5 kg 73.6 kg    Examination:  Awake Alert, Oriented X 3, No new F.N deficits, Normal affect Symmetrical Chest wall movement, Good air movement bilaterally, CTAB RRR,No Gallops,Rubs or new Murmurs, No Parasternal Heave +ve B.Sounds, Abd Soft, No tenderness, No rebound - guarding or rigidity. No Cyanosis, Clubbing or edema, No new Rash or bruise       Data Reviewed: I have personally reviewed following labs and imaging studies  CBC: Recent Labs  Lab 08/07/22 1222 08/07/22 1848 08/07/22 2313 08/08/22 1135 08/09/22 0520 08/10/22 0356 08/11/22 0315  WBC 3.7*  --  4.4  --  3.8* 3.6* 4.4  NEUTROABS 2.2  --   --    --   --   --   --   HGB 7.9*   < > 9.0*  8.8* 9.0* 8.6* 9.3* 9.1*  HCT 28.3*   < > 30.1*  29.7* 30.5* 29.1* 30.1* 29.3*  MCV 99.0  --  92.6  --  93.0 90.4 90.7  PLT 76*  --  84*  --  76* 95* 105*   < > = values in this interval not displayed.    Basic Metabolic Panel: Recent Labs  Lab 08/07/22 0618 08/07/22 2313 08/09/22 0520 08/10/22 0356 08/11/22 0315  NA 144 148* 142 138 137  K 4.1 3.6 3.4* 3.5 3.2*  CL 113* 119* 112* 105 109  CO2 27 25 22 22  21*  GLUCOSE 122* 96 89 103* 96  BUN 12 10 7  5* <5*  CREATININE 0.49 0.48 0.54 0.49 0.55  CALCIUM 8.3* 8.5* 9.0 8.7* 8.2*  MG 2.0 1.7 1.5* 1.5* 1.4*  PHOS 3.4 3.7 4.2 3.4 3.2    GFR: Estimated Creatinine Clearance: 89.2 mL/min (by C-G formula based on SCr of 0.55 mg/dL).  Liver Function Tests: Recent Labs  Lab 08/05/22 0302  08/06/22 0259 08/07/22 2313  AST 57* 61* 51*  ALT 8 9 9   ALKPHOS 105 96 79  BILITOT 2.3* 2.1* 2.1*  PROT 8.6* 8.5* 7.6  ALBUMIN 2.7* 2.7* 2.7*    CBG: Recent Labs  Lab 08/10/22 2052 08/10/22 2342 08/11/22 0333 08/11/22 0802 08/11/22 1214  GLUCAP 118* 123* 101* 103* 115*     Recent Results (from the past 240 hour(s))  Culture, Respiratory w Gram Stain     Status: None   Collection Time: 08/05/22 10:46 AM   Specimen: Bronchoalveolar Lavage; Respiratory  Result Value Ref Range Status   Specimen Description   Final    BRONCHIAL ALVEOLAR LAVAGE Performed at [place] [address], [place], [place] [address]    Special Requests   Final    NONE Performed at [facility], [place] [address], [place], [place] [address]    Gram Stain   Final    MODERATE GRAM POSITIVE RODS RARE GRAM POSITIVE COCCI IN PAIRS RARE GRAM NEGATIVE RODS NO WBC SEEN Performed at [facility], [place] [address] [place], [place] [address]    Culture   Final    FEW CITROBACTER BRAAKII ABUNDANT STAPHYLOCOCCUS AUREUS    Report Status 08/08/2022 FINAL  Final   Organism  ID, Bacteria CITROBACTER BRAAKII  Final   Organism ID, Bacteria STAPHYLOCOCCUS AUREUS  Final      Susceptibility   Citrobacter braakii - MIC*    CEFAZOLIN >=64 RESISTANT Resistant     CEFEPIME 0.5 SENSITIVE Sensitive     CEFTAZIDIME >=64 RESISTANT Resistant     CEFTRIAXONE >=64 RESISTANT Resistant     CIPROFLOXACIN <=0.25 SENSITIVE Sensitive     GENTAMICIN <=1 SENSITIVE Sensitive     IMIPENEM <=0.25 SENSITIVE Sensitive     TRIMETH/SULFA <=20 SENSITIVE Sensitive     PIP/TAZO 64 INTERMEDIATE Intermediate     * FEW CITROBACTER BRAAKII   Staphylococcus aureus - MIC*    CIPROFLOXACIN <=0.5 SENSITIVE Sensitive     ERYTHROMYCIN <=0.25 SENSITIVE Sensitive     GENTAMICIN <=0.5 SENSITIVE Sensitive     OXACILLIN <=0.25 SENSITIVE Sensitive     TETRACYCLINE <=1 SENSITIVE Sensitive     VANCOMYCIN <=0.5 SENSITIVE Sensitive     TRIMETH/SULFA <=10 SENSITIVE Sensitive     CLINDAMYCIN <=0.25 SENSITIVE Sensitive     RIFAMPIN <=0.5 SENSITIVE Sensitive     Inducible Clindamycin NEGATIVE Sensitive     * ABUNDANT STAPHYLOCOCCUS AUREUS         Radiology Studies: No results found.      Scheduled Meds:  Chlorhexidine Gluconate Cloth  6 each Topical Daily   clonazePAM  0.25 mg Per Tube QHS   feeding supplement (PROSource TF20)  60 mL Per Tube Daily   folic acid  1 mg Per Tube Daily   insulin aspart  0-9 Units Subcutaneous Q4H   lactulose  30 g Per Tube TID   pantoprazole (PROTONIX) IV  40 mg Intravenous Q12H   potassium chloride  40 mEq Oral Q6H   QUEtiapine  25 mg Per Tube QHS   rifaximin  550 mg Per Tube BID   sodium chloride flush  10-40 mL Intracatheter Q12H   zinc sulfate  220 mg Per Tube BID   Continuous Infusions:  sodium chloride 10 mL/hr (08/01/22 0615)   feeding supplement (VITAL 1.5 CAL) 20 mL/hr at 08/09/22 0406     LOS: 25 days      Phillips Climes, MD [facility]   To contact  the attending provider between 7A-7P or the covering provider during after hours  7P-7A, please log into the web site [email or web address] and access using universal [place] password for that web site. If you do not have the password, please call the hospital operator.  08/11/2022, 3:13 PM

## 2022-08-11 NOTE — Progress Notes (Signed)
Mobility Specialist Progress Note:   08/11/22 1055  Mobility  Activity Ambulated with assistance in hallway  Level of Assistance Minimal assist, patient does 75% or more  Assistive Device Front wheel walker  Distance Ambulated (ft) 200 ft  Activity Response Tolerated well  Mobility Referral Yes  $Mobility charge 1 Mobility   Pt agreeable to mobility session. Required up to minA to steady with RW. Pt drifting L throughout session, consistently running into wall/objects. Pt back in bed with all needs met, bed alarm on.   Nelta Numbers Mobility Specialist Please contact via SecureChat or  Rehab office at [phone]

## 2022-08-11 NOTE — Progress Notes (Signed)
TRH night cross cover note:   I was notified by RN of the patient's c/o abd pain, currently without prn analgesia.   Per my chart review, this is 5 female with cirrhosis. SBP's in the 90's to low 100's mmHg, appearing consistent with BP throughout the day.  Given this history and hemodynamic picture, will be cautious with IV pain medication.  Placed order for fentanyl 25 mcg IV every 2 hours prn, with close monitoring of ensuing BP's.      Babs Bertin, DO Hospitalist

## 2022-08-11 NOTE — Progress Notes (Signed)
Speech Language Pathology Treatment: Dysphagia  Patient Details Name: Joann [name] MRN: [id number] DOB: 08-04-1981 Today's Date: 08/11/2022 Time: 0160-1093 SLP Time Calculation (min) (ACUTE ONLY): 9 min  Assessment / Plan / Recommendation Clinical Impression  F/u for dysphagia. Pt's mental status is much improved since last session. She is tolerating a regular consistency diet and thin liquids. Was observed with lunch meal - she demonstrates thorough mastication, the appearance of a brisk swallow response, and no s/s of aspiration with thin liquids or with mixed solid/liquid consistencies. Dysphagia has resolved. No SLP f/u needed - our service will sign off.   HPI HPI: 41yo female admitted [facility] ED 07/25/22 with hematemesis.  Underwent EGD with banding of esophageal varices. Drinks one gallon of vodka per day per chart review, Last drink 11/14. Intubated 11/15 with self-extubation on 11/26.     PMH: ETOH abuse with associated delirium/pancreatitis/ketoacidosis/cirrhosis, DTs with seizures hepatic encephalopathy, HTN, anxiety.      SLP Plan  All goals met      Recommendations for follow up therapy are one component of a multi-disciplinary discharge planning process, led by the attending physician.  Recommendations may be updated based on patient status, additional functional criteria and insurance authorization.    Recommendations  Diet recommendations: Regular;Thin liquid Liquids provided via: Cup;Straw Medication Administration: Whole meds with liquid Supervision: Patient able to self feed                Oral Care Recommendations: Oral care BID Follow Up Recommendations: No SLP follow up SLP Visit Diagnosis: Dysphagia, unspecified (R13.10) Plan: All goals met         Tatelyn Vanhecke L. Tivis Ringer, MA CCC/SLP Clinical Specialist - Acute Care SLP Acute Rehabilitation Services Office number [phone]   Juan Quam Laurice  08/11/2022, 11:12 AM

## 2022-08-11 NOTE — Plan of Care (Signed)
  Problem: Clinical Measurements: Goal: Will remain free from infection Outcome: Progressing Goal: Respiratory complications will improve Outcome: Progressing Goal: Cardiovascular complication will be avoided Outcome: Progressing   Problem: Activity: Goal: Risk for activity intolerance will decrease Outcome: Progressing   Problem: Coping: Goal: Level of anxiety will decrease Outcome: Progressing   Problem: Respiratory: Goal: Ability to maintain a clear airway and adequate ventilation will improve Outcome: Progressing

## 2022-08-12 LAB — GLUCOSE, CAPILLARY
Glucose-Capillary: 103 mg/dL — ABNORMAL HIGH (ref 70–99)
Glucose-Capillary: 109 mg/dL — ABNORMAL HIGH (ref 70–99)
Glucose-Capillary: 155 mg/dL — ABNORMAL HIGH (ref 70–99)
Glucose-Capillary: 81 mg/dL (ref 70–99)
Glucose-Capillary: 94 mg/dL (ref 70–99)
Glucose-Capillary: 98 mg/dL (ref 70–99)

## 2022-08-12 LAB — CBC
HCT: 26.8 % — ABNORMAL LOW (ref 36.0–46.0)
Hemoglobin: 8.5 g/dL — ABNORMAL LOW (ref 12.0–15.0)
MCH: 28.6 pg (ref 26.0–34.0)
MCHC: 31.7 g/dL (ref 30.0–36.0)
MCV: 90.2 fL (ref 80.0–100.0)
Platelets: 89 10*3/uL — ABNORMAL LOW (ref 150–400)
RBC: 2.97 MIL/uL — ABNORMAL LOW (ref 3.87–5.11)
RDW: 20.4 % — ABNORMAL HIGH (ref 11.5–15.5)
WBC: 3.6 10*3/uL — ABNORMAL LOW (ref 4.0–10.5)
nRBC: 0 % (ref 0.0–0.2)

## 2022-08-12 LAB — BASIC METABOLIC PANEL
Anion gap: 10 (ref 5–15)
BUN: <5 mg/dL — ABNORMAL LOW (ref 6–20)
CO2: 21 mmol/L — ABNORMAL LOW (ref 22–32)
Calcium: 8.4 mg/dL — ABNORMAL LOW (ref 8.9–10.3)
Chloride: 107 mmol/L (ref 98–111)
Creatinine, Ser: 0.53 mg/dL (ref 0.44–1.00)
GFR, Estimated: >60 mL/min (ref >60)
Glucose, Bld: 107 mg/dL — ABNORMAL HIGH (ref 70–99)
Potassium: 3.2 mmol/L — ABNORMAL LOW (ref 3.5–5.1)
Sodium: 138 mmol/L (ref 135–145)

## 2022-08-12 LAB — MAGNESIUM: Magnesium: 2.6 mg/dL — ABNORMAL HIGH (ref 1.7–2.4)

## 2022-08-12 LAB — AMMONIA: Ammonia: 59 umol/L — ABNORMAL HIGH (ref 9–35)

## 2022-08-12 MED ORDER — POTASSIUM CHLORIDE 20 MEQ PO PACK
40.0000 meq | PACK | Freq: Four times a day (QID) | ORAL | Status: AC
  Administered 2022-08-12 (×2): 40 meq via ORAL
  Filled 2022-08-12 (×2): qty 2

## 2022-08-12 MED ORDER — PANTOPRAZOLE SODIUM 40 MG PO TBEC
40.0000 mg | DELAYED_RELEASE_TABLET | Freq: Two times a day (BID) | ORAL | Status: DC
  Administered 2022-08-12 – 2022-08-13 (×3): 40 mg via ORAL
  Filled 2022-08-12 (×3): qty 1

## 2022-08-12 NOTE — Progress Notes (Signed)
PROGRESS NOTE    Joann [name]  ZOX:[id number] DOB: 05-13-1981 DOA: 07/25/2022 PCP: Elodia Florence., MD   Chief Complaint  Patient presents with   Hematemesis    Brief Narrative:  41 year old woman who presented to [facility] ED 11/15 with nausea with multiple episodes of hematemesis over several days with RUQ abdominal, weakness, unable to eat in three days, and several days of dark stools. Drinks a gallon of vodka daily, last drink was 11/14.  History of seizures when she tries to quit. PMHx significant for current EtOH abuse, EtOH cirrhosis, DTs w/seizures, hepatic encephalopathy, HTN and anxiety.   Multiple episodes of small dark bloody emesis in ER.  Hgb 9.7, INR 1.6, Plt 76. Admitted to PCCM.  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  Significant hospital events  11/15 Admitted hematemesis, intubated/ EGD> esophageal varices s/p 6 bands, multiple blood products/ txa/ vit k 11/16 very hard to sedate then progressively hypotensive despite decreasing sedation> CTH neg, EEG/ cerebell neg, multiple blood products for coagulapathy/ txa/ vit k, tongue biting/ clenching 11/21 Has been minimally alert until today with intermittent following of commands per overnight RN 11/25 Encephalopathic but now moving extremities, not tracking 11/26 self extubated 11/28 Ongoing dark liquid stool, self-discontinued Flexiseal several times. Hgb 7.0.   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 ICU delirium -CT head negative, no acute findings -EEG 11/17 with no evidence of seizures -Ammonia level trending down, continue with lactulose at the current dose, continue with rifaximin. -She is more appropriate today, will DC her Seroquel altogether.  Did decrease her Klonopin as well.   Acute hypoxic respiratory failure - Intubated for EGD 11/15 and left on MV overnight to monitor for rebleeding of banded varices.  Self extubated 11/26 evening. - PT/OT/SLP following, appreciate - Pulmonary hygiene -Has resolved, she is on room air today   Upper GIB 2/2 bleeding grade III esophageal varices  Acute blood loss anemia  Portal hypertensive gastropathy and duodenal lesion  -Input greatly appreciated, status post banding, and status post APC and clipping of duodenal lesion. -  EGD 11/15 by Dr. 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 Completed SBP ppx. -Repeat EGD in 3 to 4 weeks -Continue to monitor CBC closely, transfuse as needed -Tolerating low-dose propranolol  Alcoholic liver cirrhosis Coagulopathy Acute on chronic thrombocytopenia - 2 units FFP now with TXA f/b 2 units PRBC 11/15. 1 FFP, 1 platelets, 1 cryo, txa, vit K 11/16. 1 FFP 11/17 - Monitor for signs of active bleeding - Transfuse for Hgb < 7.0, Plt < 20K or hemodynamically significant bleeding  High risk for ETOH withdrawal, hx of DTs ETOH abuse  - Monitor for signs/symptoms of withdrawal - Precedex has been discontinued -ongoing, 1 gallon of vodka daily, last drink 11/14, now out of window  of withdrawals - Continue Klonopin, thiamine - TOC engagement for cessation counseling/resources   Hypernatremia  Hypocalcemia  Hypophos  Hypomagnesia - Trend BMP - Replete electrolytes as indicated (goal K 4, Mg 2)   Hypoglycemia  -  improved on TF -Team oral intake.   PAF - transient related to critical illness; not AC candidate  HTN - Hold home meds at present   Urinary retention - Monitor I&Os - Bladder scan PRN   Moderate Malnutrition related to chronic illness - Appreciate Nutrition/RD consult - Monitor for refeeding syndrome    Hypokalemia Hypomagnesemia -Dustin repleted again today as well.  Will repeat CT head today as when she ambulated yesterday she was noted to lean towards the left side.  DVT prophylaxis: SCD's, no pharmacological prophy DVT prophylaxis due to coagulopathy, thrombocytopenia and GI bleed Code Status: (Full) Family Communication: ( none at bedside) spoke  with husband by phone 12/2 Disposition:   Status is: Inpatient    Consultants:  - GI -PCCM  Subjective:  She had some nausea, small amount of vomiting this morning.  Objective: Vitals:   08/12/22 1100 08/12/22 1118 08/12/22 1200 08/12/22 1300  BP:  104/67    Pulse: 81 81 80 76  Resp: (!) 23 (!) 24 17 (!) 25  Temp:  98.9 F (37.2 C)    TempSrc:  Oral    SpO2: 100% 99% 100% 95%  Weight:      Height:        Intake/Output Summary (Last 24 hours) at 08/12/2022 1438 Last data filed at 08/12/2022 1138 Gross per 24 hour  Intake 1540 ml  Output 1700 ml  Net -160 ml   Filed Weights   08/06/22 1144 08/11/22 0455 08/12/22 0500  Weight: 70.5 kg 73.6 kg 74.1 kg    Examination:  Awake Alert, Oriented X 3, No new F.N deficits, Normal affect Symmetrical Chest wall movement, Good air movement bilaterally, CTAB RRR,No Gallops,Rubs or new Murmurs, No Parasternal Heave +ve B.Sounds, Abd Soft, No tenderness, No rebound - guarding or rigidity. No Cyanosis, Clubbing or edema, No new Rash or bruise        Data Reviewed: I have personally reviewed following labs and imaging studies  CBC: Recent Labs  Lab 08/07/22 1222 08/07/22 1848 08/07/22 2313 08/08/22 1135 08/09/22 0520 08/10/22 0356 08/11/22 0315  08/12/22 0356  WBC 3.7*  --  4.4  --  3.8* 3.6* 4.4 3.6*  NEUTROABS 2.2  --   --   --   --   --   --   --   HGB 7.9*   < > 9.0*  8.8* 9.0* 8.6* 9.3* 9.1* 8.5*  HCT 28.3*   < > 30.1*  29.7* 30.5* 29.1* 30.1* 29.3* 26.8*  MCV 99.0  --  92.6  --  93.0 90.4 90.7 90.2  PLT 76*  --  84*  --  76* 95* 105* 89*   < > = values in this interval not displayed.    Basic Metabolic Panel: Recent Labs  Lab 08/07/22 0618 08/07/22 2313 08/09/22 0520 08/10/22 0356 08/11/22 0315 08/12/22 0356  NA 144 148* 142 138 137 138  K 4.1 3.6 3.4* 3.5 3.2* 3.2*  CL 113* 119* 112* 105 109 107  CO2 27 25 22 22  21* 21*  GLUCOSE 122* 96 89 103* 96 107*  BUN 12 10 7  5* <5* <5*  CREATININE 0.49 0.48 0.54 0.49 0.55 0.53  CALCIUM 8.3* 8.5* 9.0 8.7* 8.2* 8.4*  MG 2.0 1.7 1.5* 1.5* 1.4*  2.6*  PHOS 3.4 3.7 4.2 3.4 3.2  --     GFR: Estimated Creatinine Clearance: 89.5 mL/min (by C-G formula based on SCr of 0.53 mg/dL).  Liver Function Tests: Recent Labs  Lab 08/06/22 0259 08/07/22 2313  AST 61* 51*  ALT 9 9  ALKPHOS 96 79  BILITOT 2.1* 2.1*  PROT 8.5* 7.6  ALBUMIN 2.7* 2.7*    CBG: Recent Labs  Lab 08/11/22 1931 08/11/22 2359 08/12/22 0341 08/12/22 0800 08/12/22 1116  GLUCAP 119* 103* 98 155* 109*     Recent Results (from the past 240 hour(s))  Culture, Respiratory w Gram Stain     Status: None   Collection Time: 08/05/22 10:46 AM   Specimen: Bronchoalveolar Lavage; Respiratory  Result Value Ref Range Status   Specimen Description   Final    BRONCHIAL ALVEOLAR LAVAGE Performed at [facility] [address] [place], [place] [address]    Special Requests   Final    NONE Performed at [facility], [place] [address] [place], [place] [address]    Gram Stain   Final    MODERATE GRAM POSITIVE RODS RARE GRAM POSITIVE COCCI IN PAIRS RARE GRAM NEGATIVE RODS NO WBC SEEN Performed at [facility], [place] [address] [place], [place] [address]     Culture   Final    FEW CITROBACTER BRAAKII ABUNDANT STAPHYLOCOCCUS AUREUS    Report Status 08/08/2022 FINAL  Final   Organism ID, Bacteria CITROBACTER BRAAKII  Final   Organism ID, Bacteria STAPHYLOCOCCUS AUREUS  Final      Susceptibility   Citrobacter braakii - MIC*    CEFAZOLIN >=64 RESISTANT Resistant     CEFEPIME 0.5 SENSITIVE Sensitive     CEFTAZIDIME >=64 RESISTANT Resistant     CEFTRIAXONE >=64 RESISTANT Resistant     CIPROFLOXACIN <=0.25 SENSITIVE Sensitive     GENTAMICIN <=1 SENSITIVE Sensitive     IMIPENEM <=0.25 SENSITIVE Sensitive     TRIMETH/SULFA <=20 SENSITIVE Sensitive     PIP/TAZO 64 INTERMEDIATE Intermediate     * FEW CITROBACTER BRAAKII   Staphylococcus aureus - MIC*    CIPROFLOXACIN <=0.5 SENSITIVE Sensitive     ERYTHROMYCIN <=0.25 SENSITIVE Sensitive     GENTAMICIN <=0.5 SENSITIVE Sensitive     OXACILLIN <=0.25 SENSITIVE Sensitive     TETRACYCLINE <=1 SENSITIVE Sensitive     VANCOMYCIN <=0.5 SENSITIVE Sensitive     TRIMETH/SULFA <=10 SENSITIVE Sensitive     CLINDAMYCIN <=0.25 SENSITIVE Sensitive     RIFAMPIN <=0.5 SENSITIVE Sensitive     Inducible Clindamycin NEGATIVE Sensitive     * ABUNDANT STAPHYLOCOCCUS AUREUS         Radiology Studies: No results found.      Scheduled Meds:  Chlorhexidine Gluconate Cloth  6 each Topical Daily   clonazePAM  0.25 mg Oral QHS   feeding supplement (PROSource TF20)  60 mL Per Tube Daily   folic acid  1 mg Oral Daily   insulin aspart  0-9 Units Subcutaneous Q4H   lactulose  30 g Oral TID   pantoprazole  40 mg Oral BID   potassium chloride  40 mEq Oral Q6H   propranolol  20 mg Oral BID   QUEtiapine  25 mg Oral QHS   rifaximin  550 mg Oral BID   sodium chloride flush  10-40 mL Intracatheter Q12H   zinc sulfate  220 mg Per Tube BID   Continuous Infusions:  sodium chloride 10 mL/hr (08/01/22 0615)  LOS: 30 days      Phillips Climes, MD [facility]   To contact the attending provider  between 7A-7P or the covering provider during after hours 7P-7A, please log into the web site [email or web address] and access using universal [place] password for that web site. If you do not have the password, please call the hospital operator.  08/12/2022, 2:38 PM

## 2022-08-12 NOTE — Progress Notes (Signed)
Pt c/o RUQ abdominal pain 7/10, no PRN pain med ordered. MD on call notified, new order placed. On reassessment pt asleep.  Pain med not given at this time, will  continue to reevaluate need for pain med.

## 2022-08-12 NOTE — Progress Notes (Signed)
Pt vomited small amount about 100cc, yellow in appearance.

## 2022-08-13 ENCOUNTER — Inpatient Hospital Stay (HOSPITAL_COMMUNITY): Payer: Medicaid Other

## 2022-08-13 ENCOUNTER — Other Ambulatory Visit (HOSPITAL_COMMUNITY): Payer: Self-pay

## 2022-08-13 HISTORY — PX: IR RADIOLOGIST EVAL & MGMT: IMG5224

## 2022-08-13 LAB — POTASSIUM: Potassium: 3.8 mmol/L (ref 3.5–5.1)

## 2022-08-13 LAB — GLUCOSE, CAPILLARY
Glucose-Capillary: 104 mg/dL — ABNORMAL HIGH (ref 70–99)
Glucose-Capillary: 120 mg/dL — ABNORMAL HIGH (ref 70–99)
Glucose-Capillary: 131 mg/dL — ABNORMAL HIGH (ref 70–99)
Glucose-Capillary: 91 mg/dL (ref 70–99)

## 2022-08-13 LAB — BASIC METABOLIC PANEL
Anion gap: 5 (ref 5–15)
BUN: <5 mg/dL — ABNORMAL LOW (ref 6–20)
CO2: 16 mmol/L — ABNORMAL LOW (ref 22–32)
Calcium: 6.6 mg/dL — ABNORMAL LOW (ref 8.9–10.3)
Chloride: 114 mmol/L — ABNORMAL HIGH (ref 98–111)
Creatinine, Ser: 0.4 mg/dL — ABNORMAL LOW (ref 0.44–1.00)
GFR, Estimated: >60 mL/min (ref >60)
Glucose, Bld: 82 mg/dL (ref 70–99)
Potassium: 2.7 mmol/L — CL (ref 3.5–5.1)
Sodium: 135 mmol/L (ref 135–145)

## 2022-08-13 LAB — MAGNESIUM
Magnesium: 1.1 mg/dL — ABNORMAL LOW (ref 1.7–2.4)
Magnesium: 1.7 mg/dL (ref 1.7–2.4)

## 2022-08-13 MED ORDER — ZINC SULFATE 220 (50 ZN) MG PO CAPS
220.0000 mg | ORAL_CAPSULE | Freq: Two times a day (BID) | ORAL | Status: DC
  Administered 2022-08-13: 220 mg via ORAL
  Filled 2022-08-13: qty 1

## 2022-08-13 MED ORDER — POTASSIUM CHLORIDE 10 MEQ/100ML IV SOLN
10.0000 meq | INTRAVENOUS | Status: AC
  Administered 2022-08-13 (×2): 10 meq via INTRAVENOUS
  Filled 2022-08-13 (×2): qty 100

## 2022-08-13 MED ORDER — POTASSIUM CHLORIDE CRYS ER 20 MEQ PO TBCR
20.0000 meq | EXTENDED_RELEASE_TABLET | Freq: Every day | ORAL | 0 refills | Status: DC
  Filled 2022-08-13: qty 30, 30d supply, fill #0

## 2022-08-13 MED ORDER — TRAZODONE HCL 150 MG PO TABS
150.0000 mg | ORAL_TABLET | Freq: Every evening | ORAL | 0 refills | Status: DC | PRN
  Filled 2022-08-13: qty 30, 30d supply, fill #0

## 2022-08-13 MED ORDER — ADULT MULTIVITAMIN W/MINERALS CH
1.0000 | ORAL_TABLET | Freq: Every day | ORAL | Status: DC
  Administered 2022-08-13: 1 via ORAL
  Filled 2022-08-13: qty 1

## 2022-08-13 MED ORDER — MAGNESIUM SULFATE 2 GM/50ML IV SOLN
2.0000 g | Freq: Once | INTRAVENOUS | Status: AC
  Administered 2022-08-13: 2 g via INTRAVENOUS
  Filled 2022-08-13: qty 50

## 2022-08-13 MED ORDER — POTASSIUM CHLORIDE CRYS ER 20 MEQ PO TBCR
40.0000 meq | EXTENDED_RELEASE_TABLET | ORAL | Status: AC
  Administered 2022-08-13 (×3): 40 meq via ORAL
  Filled 2022-08-13 (×3): qty 2

## 2022-08-13 MED ORDER — PROPRANOLOL HCL 20 MG PO TABS
20.0000 mg | ORAL_TABLET | Freq: Two times a day (BID) | ORAL | 0 refills | Status: DC
  Filled 2022-08-13: qty 60, 30d supply, fill #0

## 2022-08-13 MED ORDER — RIFAXIMIN 550 MG PO TABS
550.0000 mg | ORAL_TABLET | Freq: Two times a day (BID) | ORAL | 0 refills | Status: DC
  Filled 2022-08-13: qty 60, 30d supply, fill #0

## 2022-08-13 MED ORDER — LACTULOSE 10 GM/15ML PO SOLN
30.0000 g | Freq: Three times a day (TID) | ORAL | 0 refills | Status: DC
  Filled 2022-08-13: qty 1892, 14d supply, fill #0

## 2022-08-13 NOTE — Discharge Summary (Signed)
Physician Discharge Summary  HATSUMI STEINHART NTZ:[id number] DOB: 03-31-1981 DOA: 07/25/2022  PCP: Elodia Florence., MD  Admit date: 07/25/2022 Discharge date: 08/13/2022  Admitted From: (Home) Disposition:  (Home ) initial recommendations were for SNF, but after discussing with the patient, and her husband, want to proceed to discharge home, with home health, which has been arranged.  Recommendations for Outpatient Follow-up:  Follow up with PCP in 1-2 weeks Please obtain BMP/CBC in one week To follow with GI as an outpatient regarding repeat endoscopy , he is scheduled with Dr. Rush Landmark on 12/21 4 PM.  Home Health: (YES)  Discharge Condition: (Stable) CODE STATUS: (FULL) Diet recommendation: low sodium  Brief/Interim Summary:  41 year old woman who presented to [facility] ED 11/15 with nausea with multiple episodes of hematemesis over several days with RUQ abdominal, weakness, unable to eat in three days, and several days of dark stools. Drinks a gallon of vodka daily, last drink was 11/14.  History of seizures when she tries to quit. PMHx significant for current EtOH abuse, EtOH cirrhosis, DTs w/seizures, hepatic encephalopathy, HTN and anxiety.   Multiple episodes of small dark bloody emesis in ER.  Hgb 9.7, INR 1.6, Plt 76. Admitted to PCCM.  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   Significant hospital events   11/15 Admitted hematemesis, intubated/ EGD> esophageal varices s/p 6 bands, multiple blood products/ txa/ vit k 11/16 very hard to sedate then progressively hypotensive despite decreasing sedation> CTH neg, EEG/ cerebell neg, multiple blood products for coagulapathy/ txa/ vit k, tongue biting/ clenching 11/21 Has been minimally alert until today with intermittent following of commands per overnight RN 11/25 Encephalopathic but now moving  extremities, not tracking 11/26 self extubated 11/28 Ongoing dark liquid stool, self-discontinued Flexiseal several times. Hgb 7.0.   Hepatic encephalopathy due to hyperammonemia . ICU delirium -CT head negative, no acute findings -EEG 11/17 with no evidence of seizures -Ammonia level trending down, continue with lactulose at the current dose, continue with rifaximin. -This has resolved, mentation back to baseline   Acute hypoxic respiratory failure - Intubated for EGD 11/15 and left on MV overnight to monitor for rebleeding of banded varices.  Self extubated 11/26 evening. - PT/OT/SLP following, appreciate -Has resolved, she is on room air today   Upper GIB 2/2 bleeding grade III esophageal varices  Acute blood loss anemia  Portal hypertensive gastropathy and duodenal lesion  -Input greatly appreciated, status post banding, and status post APC and clipping of duodenal lesion. -  EGD 11/15 by Dr. 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 Completed SBP ppx. -Repeat EGD in 3 to 4 weeks, appointment has been scheduled by Dr. Rush Landmark on 08/30/2022, patient has been notified by the appointment. -Tolerating low-dose propranolol   Alcoholic liver cirrhosis Coagulopathy Acute on chronic thrombocytopenia - 2 units FFP now with TXA f/b 2 units PRBC 11/15. 1 FFP, 1 platelets, 1 cryo, txa, vit K 11/16. 1 FFP 11/17 -No signs of GI bleeding over last 72 hours, her hemoglobin has been stable.     High risk for ETOH withdrawal, hx of DTs ETOH abuse  - Monitor for signs/symptoms of withdrawal - Precedex has been discontinued -ongoing,  1 gallon of vodka daily, last drink 11/14, now out of window of withdrawals - Continue  thiamine - TOC engagement for cessation counseling/resources   Hypernatremia  Hypocalcemia   Hypophosphatmeia Hypomagnesia - repleted   Hypoglycemia  - improved on TF -resolved   PAF - transient related to critical illness; not AC candidate   Urinary retention - resolved   Moderate Malnutrition related to chronic illness - Appreciate Nutrition/RD consult - Monitor for refeeding syndrome     Discharge Diagnoses:  Principal Problem:   GI bleed Active Problems:   Alcohol withdrawal syndrome with complication (HCC)   Acute upper GI bleed   Alcoholic cirrhosis (HCC)   Encephalopathy, hepatic (HCC)   Bleeding esophageal varices (HCC)   AVM (arteriovenous malformation) of small bowel, acquired with hemorrhage   Esophageal varices in cirrhosis [facility])   Hematemesis    Discharge Instructions  Discharge Instructions     Discharge instructions   Complete by: As directed    Follow with Primary MD Elodia Florence., MD in 7 days   Get CBC, CMP, checked  by Primary MD next visit.    Activity: As tolerated with Full fall precautions use walker/cane & assistance as needed   Disposition Home    Diet: Low sodium    On your next visit with your primary care physician please Get Medicines reviewed and adjusted.   Please request your Prim.MD to go over all Hospital Tests and Procedure/Radiological results at the follow up, please get all Hospital records sent to your Prim MD by signing hospital release before you go home.   If you experience worsening of your admission symptoms, develop shortness of breath, life threatening emergency, suicidal or homicidal thoughts you must seek medical attention immediately by calling 911 or calling your MD immediately  if symptoms less severe.  You Must read complete instructions/literature along with all the possible adverse reactions/side effects for all the Medicines you take and that have been prescribed to you. Take any new Medicines after you have completely understood and accpet all the possible adverse reactions/side  effects.   Do not drive, operating heavy machinery, perform activities at heights, swimming or participation in water activities or provide baby sitting services if your were admitted for syncope or siezures until you have seen by Primary MD or a Neurologist and advised to do so again.  Do not drive when taking Pain medications.    Do not take more than prescribed Pain, Sleep and Anxiety Medications  Special Instructions: If you have smoked or chewed Tobacco  in the last 2 yrs please stop smoking, stop any regular Alcohol  and or any Recreational drug use.  Wear Seat belts while driving.   Please note  You were cared for by a hospitalist during your hospital stay. If you have any questions about your discharge medications or the care you received while you were in the hospital after you are discharged, you can call the unit and asked to speak with the hospitalist on call if the hospitalist that took care of you is not available. Once you are discharged, your primary care physician will handle any further medical issues. Please note that NO REFILLS for any discharge medications will be authorized once you are discharged, as it is imperative that you return to your primary care physician (or establish a relationship with a primary care physician if you do not have one) for your aftercare needs so that they can reassess your need for medications and monitor  your lab values.   No wound care   Complete by: As directed       Allergies as of 08/13/2022       Reactions   Latex Hives        Medication List     STOP taking these medications    carvedilol 3.125 MG tablet Commonly known as: COREG   ibuprofen 200 MG tablet Commonly known as: ADVIL   phytonadione 5 MG tablet Commonly known as: VITAMIN K       TAKE these medications    ferrous sulfate 325 (65 FE) MG tablet Take 1 tablet (325 mg total) by mouth daily before breakfast. Take with a cup of juice What changed:  additional instructions   folic acid 1 MG tablet Commonly known as: FOLVITE Take 1 tablet (1 mg total) by mouth daily.   lactulose 10 GM/15ML solution Commonly known as: CHRONULAC Take 45 mLs (30 g total) by mouth 3 (three) times daily.   multivitamin with minerals Tabs tablet Take 1 tablet by mouth daily. What changed: when to take this   pantoprazole 40 MG tablet Commonly known as: PROTONIX Take 1 tablet (40 mg total) by mouth 2 (two) times daily.   potassium chloride SA 20 MEQ tablet Commonly known as: KLOR-CON M Take 1 tablet (20 mEq total) by mouth daily.   propranolol 20 MG tablet Commonly known as: INDERAL Take 1 tablet (20 mg total) by mouth 2 (two) times daily.   sucralfate 1 g tablet Commonly known as: CARAFATE Take 1 tablet (1 g total) by mouth 4 (four) times daily -  with meals and at bedtime.   thiamine 100 MG tablet Commonly known as: Vitamin B-1 Take 1 tablet (100 mg total) by mouth daily.   traZODone 150 MG tablet Commonly known as: DESYREL Take 1 tablet (150 mg total) by mouth at bedtime as needed for sleep. What changed:  when to take this reasons to take this   Xifaxan 550 MG Tabs tablet Generic drug: rifaximin Take 1 tablet (550 mg total) by mouth 2 (two) times daily.        Allergies  Allergen Reactions   Latex Hives    Consultations: GI PCCM   Procedures/Studies: DG Abd 1 View  Result Date: 08/08/2022 CLINICAL DATA:  Encounter for nasogastric tube placement. EXAM: ABDOMEN - 1 VIEW COMPARISON:  Radiograph 08/06/2022 FINDINGS: Tip of the weighted enteric tube is in the right upper quadrant, in the region of the distal stomach or proximal duodenum. Nonobstructive included bowel gas pattern. Right upper quadrant surgical clips typical of cholecystectomy. IMPRESSION: Tip of the weighted enteric tube in the right upper quadrant, in the region of the distal stomach or proximal duodenum. Electronically Signed   By: Keith Rake M.D.    On: 08/08/2022 12:35   [place] EKG SITE RITE  Result Date: 08/07/2022 If Site Rite image not attached, placement could not be confirmed due to current cardiac rhythm.  DG CHEST PORT 1 VIEW  Result Date: 08/06/2022 CLINICAL DATA:  Possible aspiration EXAM: PORTABLE CHEST 1 VIEW COMPARISON:  07/31/2022 FINDINGS: Endotracheal tube has been removed. Feeding catheter is noted extending into the stomach. The lungs are clear bilaterally. No findings to suggest aspiration are noted. Cardiac shadow is stable. No bony abnormality is noted. IMPRESSION: No acute abnormality noted. Electronically Signed   By: Inez Catalina M.D.   On: 08/06/2022 20:56   DG Abd 1 View  Result Date: 08/06/2022 CLINICAL DATA:  NGT placement EXAM: ABDOMEN -  1 VIEW COMPARISON:  None Available. FINDINGS: The bowel gas pattern is normal. No radio-opaque calculi or other significant radiographic abnormality are seen. Feeding tube tip overlies the expected location of the duodenal. IMPRESSION: Negative.  Feeding tube appears to be appropriately positioned. Electronically Signed   By: Sammie Bench M.D.   On: 08/06/2022 09:29   DG CHEST PORT 1 VIEW  Result Date: 07/31/2022 CLINICAL DATA:  53664 Hypoxemia [id number] EXAM: PORTABLE CHEST 1 VIEW COMPARISON:  Chest x-ray 07/27/2022 FINDINGS: Endotracheal tube with tip terminating 7 cm above the carina. Enteric tube coursing below the hemidiaphragm with tip overlying the expected region of the gastric antrum. The heart and mediastinal contours are within normal limits. Bibasilar streaky airspace opacities suggestive of atelectasis. No focal consolidation. No pulmonary edema. No pleural effusion. No pneumothorax. No acute osseous abnormality.  Right upper quadrant surgical clips. IMPRESSION: Bibasilar streaky airspace opacities suggestive of atelectasis. Electronically Signed   By: Iven Finn M.D.   On: 07/31/2022 19:19   DG Abd 1 View  Result Date: 07/31/2022 CLINICAL DATA:  Nasogastric  tube placement. EXAM: ABDOMEN - 1 VIEW COMPARISON:  Same day. FINDINGS: Distal tip of feeding tube is seen in expected position of distal stomach. IMPRESSION: Distal tip of feeding tube is seen in expected position of distal stomach. Electronically Signed   By: Marijo Conception M.D.   On: 07/31/2022 12:33   DG Abd Portable 1V  Result Date: 07/31/2022 CLINICAL DATA:  Feeding tube EXAM: PORTABLE ABDOMEN - 1 VIEW COMPARISON:  07/30/22 CXR FINDINGS: Interval retraction of removal of the enteric tube. Surgical clips in the right upper quadrant. Right groin vascular catheter in place. Bladder temperature probe versus bladder catheter in place. Degenerative changes in the lower lumbar spine. No dilated loops of small bowel are visualized. Normal caliber loops of large bowel. No pneumatosis. No supine evidence of pneumoperitoneum left lung base is without acute abnormality. IMPRESSION: Interval retraction or removal of enteric tube; correlate with history. If the tube has been repositioned further evaluation with a chest radiograph is recommended. Electronically Signed   By: Marin Roberts M.D.   On: 07/31/2022 08:40   DG Abd 1 View  Result Date: 07/30/2022 CLINICAL DATA:  Nasogastric tube placement, orogastric tube adjustment. EXAM: ABDOMEN - 1 VIEW COMPARISON:  Radiograph earlier today. FINDINGS: The enteric tube is again noted to be kinked in the stomach, the tip is directed cranially towards the gastroesophageal junction. Tube has been retracted since prior exam. Nonobstructive upper abdominal bowel gas pattern. IMPRESSION: Enteric tube is kinked in the stomach, with the tip directed cranially towards the gastroesophageal junction. Tube has been retracted since prior exam. Electronically Signed   By: Keith Rake M.D.   On: 07/30/2022 19:48   DG Abd 1 View  Result Date: 07/30/2022 CLINICAL DATA:  Nodes 0 gastric tube placement EXAM: ABDOMEN - 1 VIEW COMPARISON:  07/30/2022 at 4 p.m. FINDINGS: NG tube  extends the stomach. The NG tube appears folded back upon itself however this could be projectional. IMPRESSION: 1. No significant change in NG tube position in the stomach. 2. If tube flushing and drain appropriately, no intervention necessary. If resistance, consider retraction by 5 to 8 cm to straighten NG tube Electronically Signed   By: Suzy Bouchard M.D.   On: 07/30/2022 17:11   DG Abd 1 View  Result Date: 07/30/2022 CLINICAL DATA:  [id number] Encounter for nasogastric (NG) tube placement [id number] EXAM: ABDOMEN - 1 VIEW COMPARISON:  X-ray abdomen 07/25/2022 FINDINGS:  Upper abdomen collimated off view. Enteric tube with tip and side port overlying the expected region the gastric lumen. The bowel gas pattern is normal. Endoluminal clip overlies the mid abdomen at the level of the L2 level. No radio-opaque calculi or other significant radiographic abnormality are seen. IMPRESSION: 1. Enteric tube in good position.  Could be retracted by 8 cm. 2. Nonobstructive bowel gas pattern. Electronically Signed   By: Iven Finn M.D.   On: 07/30/2022 16:26   [place] EKG SITE RITE  Result Date: 07/30/2022 If Site Rite image not attached, placement could not be confirmed due to current cardiac rhythm.  US ABDOMEN LIMITED WITH LIVER DOPPLER  Result Date: 07/27/2022 CLINICAL DATA:  Abnormal liver function tests EXAM: DUPLEX ULTRASOUND OF LIVER TECHNIQUE: Color and duplex Doppler ultrasound was performed to evaluate the hepatic in-flow and out-flow vessels. COMPARISON:  CT abdomen pelvis 02/24/2022 FINDINGS: Liver: Diffuse heterogeneity of hepatic parenchymal echogenicity. Nodular hepatic contours consistent with cirrhosis. 0.8 Main Portal Vein size: 18 cm Portal Vein Velocities Main Prox:  18 cm/sec Main Mid: 28 cm/sec Main Dist:  31 cm/sec Right: 20 cm/sec Left: 24 cm/sec Hepatic Vein Velocities Right:  27 cm/sec Middle:  28 cm/sec Left:  30 cm/sec IVC: Present and patent with normal respiratory phasicity. Hepatic  Artery Velocity:  153 cm/sec Splenic Vein Velocity:  16 cm/sec Spleen: 9.8 cm x 12.7 cm x 4.3 cm with a total volume of 326 cm^3 (411 cm^3 is upper limit normal) Portal Vein Occlusion/Thrombus: No Splenic Vein Occlusion/Thrombus: No Ascites: None Varices: None Gallbladder surgically absent. Common bile duct was obscured by shadowing bowel gas. IMPRESSION: 1. Cirrhotic liver morphology. 2. Hepatofugal flow seen in portal vein consistent with portal hypertension. Electronically Signed   By: Miachel Roux M.D.   On: 07/27/2022 12:49   Rapid EEG  Result Date: 07/27/2022 Lora Havens, MD     07/27/2022 11:18 AM Patient Name: Joann [name] MRN: [id number] Epilepsy Attending: Lora Havens Referring Physician/Provider: Dr Noemi Chapel Duration: 07/26/2022 2104 07/27/2022 0001  Patient history: 41yo F with ams. EEG to evaluate for seizure  Level of alertness:  comatose  AEDs during EEG study: Propofol  Technical aspects: This EEG was obtained using a 10 lead EEG system positioned circumferentially without any parasagittal coverage (rapid EEG). Computer selected EEG is reviewed as  well as background features and all clinically significant events. Description: EEG showed continuous generalized 2-3.5Hz  delta slowing.  Hyperventilation and photic stimulation were not performed.    ABNORMALITY - Continuous slow, generalized  IMPRESSION: This limited ceribell EEEGy is suggestive of severe diffuse encephalopathy, nonspecific etiology. No seizures or epileptiform discharges were seen throughout the recording.  Lora Havens  DG CHEST PORT 1 VIEW  Result Date: 07/27/2022 CLINICAL DATA:  41 year old female intubated. Respiratory failure. Encephalopathy. EXAM: PORTABLE CHEST 1 VIEW COMPARISON:  Portable chest 0425 hours today. FINDINGS: Portable AP supine view at 0714 hours. Stable endotracheal tube tip at the level the clavicles. Improved lung base ventilation with less veiling opacity bilaterally. No superimposed  pneumothorax, pulmonary edema. Mediastinal contours are within normal limits. No definite air bronchograms. Paucity of bowel gas in the upper abdomen. No acute osseous abnormality identified. IMPRESSION: 1. Stable ET tube, tip at the level the clavicles. 2. Improved lung base ventilation since 0425 hours, regressed atelectasis and/or effusions. 3. No new cardiopulmonary abnormality. Electronically Signed   By: Genevie Ann M.D.   On: 07/27/2022 08:22   EEG adult  Result Date: 07/27/2022 Zeb Comfort  Jenetta Downer, MD     07/27/2022 10:21 AM Patient Name: NAHLIA HELLMANN MRN: [id number] Epilepsy Attending: Lora Havens Referring Physician/Provider: Dr Noemi Chapel Date: 07/27/2022 Duration: 32.31 mins Patient history: 41yo F with ams. EEG to evaluate for seizure Level of alertness:  comatose AEDs during EEG study: Propofol Technical aspects: This EEG study was done with scalp electrodes positioned according to the 10-20 International system of electrode placement. Electrical activity was reviewed with band pass filter of 1-70Hz , sensitivity of 7 uV/mm, display speed of 16m/sec with a 60Hz  notched filter applied as appropriate. EEG data were recorded continuously and digitally stored.  Video monitoring was available and reviewed as appropriate. Description: EEG showed continuous generalized 2-3.5Hz  delta slowing.  Hyperventilation and photic stimulation were not performed.   ABNORMALITY - Continuous slow, generalized IMPRESSION: This study is suggestive of severe diffuse encephalopathy, nonspecific etiology. No seizures or epileptiform discharges were seen throughout the recording. PLora Havens  DG Chest Port 1 View  Addendum Date: 07/27/2022   ADDENDUM REPORT: 07/27/2022 06:41 ADDENDUM: Discussed over phone with Dr. SCorinna Linesat 6:35 a.m., 07/27/2022, with verbal acknowledgement of key findings. Electronically Signed   By: KTelford NabM.D.   On: 07/27/2022 06:41   Result Date: 07/27/2022 CLINICAL DATA:  [id number]  respiratory failure. EXAM: PORTABLE CHEST 1 VIEW COMPARISON:  Portable chest 07/25/2022 FINDINGS: 4:25 a.m. ETT now enters the orifice of the right main bronchus and needs to be withdrawn 3-4 cm to a mid tracheal positioning. The heart is enlarged. There is worsening perihilar vascular congestion, increasing central interstitial edema, and increasing moderate pleural effusions with overlying opacities. The lung opacities could be due to atelectasis, airspace edema or pneumonia. The upper lung fields are clear of focal opacity. The mediastinum is stable. In all other respects no further changes. IMPRESSION: 1. ETT now enters the orifice of the right main bronchus and needs to be withdrawn 3-4 cm to a mid tracheal positioning. 2. Worsening perihilar vascular congestion, central interstitial edema, and moderate pleural effusions with increased overlying opacities. 3. Radiologist assistant is attempting to reach the ordering physician or representative at the time of this dictation for stat notification. This report will be addended when notification has been made. Electronically Signed: By: KTelford NabM.D. On: 07/27/2022 06:32   CT HEAD WO CONTRAST (5MM)  Result Date: 07/26/2022 CLINICAL DATA:  Encephalopathy. EXAM: CT HEAD WITHOUT CONTRAST TECHNIQUE: Contiguous axial images were obtained from the base of the skull through the vertex without intravenous contrast. RADIATION DOSE REDUCTION: This exam was performed according to the departmental dose-optimization program which includes automated exposure control, adjustment of the mA and/or kV according to patient size and/or use of iterative reconstruction technique. COMPARISON:  Head CT 06/18/2022. FINDINGS: Brain: There is no evidence of acute intracranial hemorrhage, mass lesion, brain edema or extra-axial fluid collection. The ventricles and subarachnoid spaces are appropriately sized for age. There is no CT evidence of acute cortical infarction. Vascular:   No hyperdense vessel identified. Skull: Negative for fracture or focal lesion. Sinuses/Orbits: Mucosal thickening in the nasal passages. The paranasal sinuses, mastoid air cells and middle ears are clear. No significant orbital findings. Other: None. IMPRESSION: 1. No acute intracranial findings. 2. Mucosal thickening in the nasal passages. Electronically Signed   By: WRichardean SaleM.D.   On: 07/26/2022 16:38   [place] SITE RITE  Result Date: 07/26/2022 If Site Rite image not attached, placement could not be confirmed due to current cardiac rhythm.  DG  Chest Port 1 View  Result Date: 07/25/2022 CLINICAL DATA:  Intubation EXAM: PORTABLE CHEST 1 VIEW COMPARISON:  07/25/2022 at 0549 hours FINDINGS: Interval placement of endotracheal tube with distal tip terminating approximately 2.8 cm above the carina. Heart size within normal limits. Slightly low lung volumes. No focal airspace consolidation, pleural effusion, or pneumothorax. IMPRESSION: Interval placement of endotracheal tube with distal tip terminating approximately 2.8 cm above the carina. Electronically Signed   By: Davina Poke D.O.   On: 07/25/2022 16:21   DG Abd 1 View  Result Date: 07/25/2022 CLINICAL DATA:  41 year old female with abdominal pain and hematemesis. EXAM: ABDOMEN - 1 VIEW COMPARISON:  Abdominal radiographs 06/26/2022 and earlier. FINDINGS: Portable AP supine view at 1013 hours. Non obstructed bowel gas pattern. Small volume gas in fairly decompressed stomach. No pneumoperitoneum is evident on these supine views. Lung bases appear negative. Stable cholecystectomy clips. Other abdominal and pelvic visceral contours are stable and within normal limits. Chronic pelvic phleboliths. No acute osseous abnormality identified. IMPRESSION: 1. Normal bowel gas pattern.  Negative visible lung bases. 2. Note history of cirrhosis and consider variceal bleeding in the setting of hematemesis. Electronically Signed   By: Genevie Ann M.D.   On:  07/25/2022 09:30   DG Chest Port 1 View  Result Date: 07/25/2022 CLINICAL DATA:  41 year old female under screening evaluation. EXAM: PORTABLE CHEST 1 VIEW COMPARISON:  Chest x-ray 06/25/2022. FINDINGS: Lung volumes are normal. No consolidative airspace disease. No pleural effusions. No pneumothorax. No pulmonary nodule or mass noted. Pulmonary vasculature and the cardiomediastinal silhouette are within normal limits. IMPRESSION: No radiographic evidence of acute cardiopulmonary disease. Electronically Signed   By: Vinnie Langton M.D.   On: 07/25/2022 06:05      Subjective: No significant events overnight, she denies any complaints, no nausea, no vomiting, she reports 3-4 bowel movements per day.  Discharge Exam: Vitals:   08/13/22 0800 08/13/22 1210  BP: 102/68 100/75  Pulse: 75 76  Resp: 20 20  Temp: 98 F (36.7 C) 99 F (37.2 C)  SpO2: 95% 94%   Vitals:   08/13/22 0417 08/13/22 0428 08/13/22 0800 08/13/22 1210  BP: 98/62  102/68 100/75  Pulse:   75 76  Resp: (!) 23 19 20 20   Temp: 98.2 F (36.8 C)  98 F (36.7 C) 99 F (37.2 C)  TempSrc: Oral  Oral Oral  SpO2: 96%  95% 94%  Weight: 74.4 kg     Height:        General: Pt is alert, awake, not in acute distress Cardiovascular: RRR, S1/S2 +, no rubs, no gallops Respiratory: CTA bilaterally, no wheezing, no rhonchi Abdominal: Soft, NT, ND, bowel sounds + Extremities: no edema, no cyanosis    The results of significant diagnostics from this hospitalization (including imaging, microbiology, ancillary and laboratory) are listed below for reference.     Microbiology: Recent Results (from the past 240 hour(s))  Culture, Respiratory w Gram Stain     Status: None   Collection Time: 08/05/22 10:46 AM   Specimen: Bronchoalveolar Lavage; Respiratory  Result Value Ref Range Status   Specimen Description   Final    BRONCHIAL ALVEOLAR LAVAGE Performed at [facility] [address] [place], [place]  [address]    Special Requests   Final    NONE Performed at [facility], [place] [address], [place], [place] [address]    Gram Stain   Final    MODERATE GRAM POSITIVE RODS RARE Lonell Grandchild  POSITIVE COCCI IN PAIRS RARE GRAM NEGATIVE RODS NO WBC SEEN Performed at [facility], [place] [address] [place], [place] [address]    Culture   Final    FEW CITROBACTER BRAAKII ABUNDANT STAPHYLOCOCCUS AUREUS    Report Status 08/08/2022 FINAL  Final   Organism ID, Bacteria CITROBACTER BRAAKII  Final   Organism ID, Bacteria STAPHYLOCOCCUS AUREUS  Final      Susceptibility   Citrobacter braakii - MIC*    CEFAZOLIN >=64 RESISTANT Resistant     CEFEPIME 0.5 SENSITIVE Sensitive     CEFTAZIDIME >=64 RESISTANT Resistant     CEFTRIAXONE >=64 RESISTANT Resistant     CIPROFLOXACIN <=0.25 SENSITIVE Sensitive     GENTAMICIN <=1 SENSITIVE Sensitive     IMIPENEM <=0.25 SENSITIVE Sensitive     TRIMETH/SULFA <=20 SENSITIVE Sensitive     PIP/TAZO 64 INTERMEDIATE Intermediate     * FEW CITROBACTER BRAAKII   Staphylococcus aureus - MIC*    CIPROFLOXACIN <=0.5 SENSITIVE Sensitive     ERYTHROMYCIN <=0.25 SENSITIVE Sensitive     GENTAMICIN <=0.5 SENSITIVE Sensitive     OXACILLIN <=0.25 SENSITIVE Sensitive     TETRACYCLINE <=1 SENSITIVE Sensitive     VANCOMYCIN <=0.5 SENSITIVE Sensitive     TRIMETH/SULFA <=10 SENSITIVE Sensitive     CLINDAMYCIN <=0.25 SENSITIVE Sensitive     RIFAMPIN <=0.5 SENSITIVE Sensitive     Inducible Clindamycin NEGATIVE Sensitive     * ABUNDANT STAPHYLOCOCCUS AUREUS     Labs: BNP (last 3 results) No results for input(s): "BNP" in the last 8760 hours. Basic Metabolic Panel: Recent Labs  Lab 08/07/22 0618 08/07/22 2313 08/09/22 0520 08/10/22 0356 08/11/22 0315 08/12/22 0356 08/13/22 0444 08/13/22 1436  NA 144 148* 142 138 137 138 135  --   K 4.1 3.6 3.4* 3.5 3.2* 3.2* 2.7* 3.8  CL 113* 119* 112* 105 109 107 114*  --   CO2 27 25 22 22  21* 21* 16*  --   GLUCOSE  122* 96 89 103* 96 107* 82  --   BUN 12 10 7  5* <5* <5* <5*  --   CREATININE 0.49 0.48 0.54 0.49 0.55 0.53 0.40*  --   CALCIUM 8.3* 8.5* 9.0 8.7* 8.2* 8.4* 6.6*  --   MG 2.0 1.7 1.5* 1.5* 1.4* 2.6* 1.1* 1.7  PHOS 3.4 3.7 4.2 3.4 3.2  --   --   --    Liver Function Tests: Recent Labs  Lab 08/07/22 2313  AST 51*  ALT 9  ALKPHOS 79  BILITOT 2.1*  PROT 7.6  ALBUMIN 2.7*   No results for input(s): "LIPASE", "AMYLASE" in the last 168 hours. Recent Labs  Lab 08/10/22 0356 08/11/22 0315 08/12/22 0527  AMMONIA 71* 65* 59*   CBC: Recent Labs  Lab 08/07/22 1222 08/07/22 1848 08/07/22 2313 08/08/22 1135 08/09/22 0520 08/10/22 0356 08/11/22 0315 08/12/22 0356  WBC 3.7*  --  4.4  --  3.8* 3.6* 4.4 3.6*  NEUTROABS 2.2  --   --   --   --   --   --   --   HGB 7.9*   < > 9.0*  8.8* 9.0* 8.6* 9.3* 9.1* 8.5*  HCT 28.3*   < > 30.1*  29.7* 30.5* 29.1* 30.1* 29.3* 26.8*  MCV 99.0  --  92.6  --  93.0 90.4 90.7 90.2  PLT 76*  --  84*  --  76* 95* 105* 89*   < > = values in this interval not  displayed.   Cardiac Enzymes: No results for input(s): "CKTOTAL", "CKMB", "CKMBINDEX", "TROPONINI" in the last 168 hours. BNP: Invalid input(s): "POCBNP" CBG: Recent Labs  Lab 08/12/22 1958 08/13/22 0040 08/13/22 0336 08/13/22 0755 08/13/22 1133  GLUCAP 94 120* 91 131* 104*   D-Dimer No results for input(s): "DDIMER" in the last 72 hours. Hgb A1c No results for input(s): "HGBA1C" in the last 72 hours. Lipid Profile No results for input(s): "CHOL", "HDL", "LDLCALC", "TRIG", "CHOLHDL", "LDLDIRECT" in the last 72 hours. Thyroid function studies No results for input(s): "TSH", "T4TOTAL", "T3FREE", "THYROIDAB" in the last 72 hours.  Invalid input(s): "FREET3" Anemia work up No results for input(s): "VITAMINB12", "FOLATE", "FERRITIN", "TIBC", "IRON", "RETICCTPCT" in the last 72 hours. Urinalysis    Component Value Date/Time   COLORURINE AMBER (A) 07/26/2022 2123   APPEARANCEUR CLEAR  07/26/2022 2123   LABSPEC 1.017 07/26/2022 2123   PHURINE 6.0 07/26/2022 2123   GLUCOSEU NEGATIVE 07/26/2022 2123   HGBUR NEGATIVE 07/26/2022 2123   BILIRUBINUR SMALL (A) 07/26/2022 2123   KETONESUR NEGATIVE 07/26/2022 2123   PROTEINUR 30 (A) 07/26/2022 2123   NITRITE NEGATIVE 07/26/2022 2123   LEUKOCYTESUR NEGATIVE 07/26/2022 2123   Sepsis Labs Recent Labs  Lab 08/09/22 0520 08/10/22 0356 08/11/22 0315 08/12/22 0356  WBC 3.8* 3.6* 4.4 3.6*   Microbiology Recent Results (from the past 240 hour(s))  Culture, Respiratory w Gram Stain     Status: None   Collection Time: 08/05/22 10:46 AM   Specimen: Bronchoalveolar Lavage; Respiratory  Result Value Ref Range Status   Specimen Description   Final    BRONCHIAL ALVEOLAR LAVAGE Performed at [facility] [address] [place], [place] [address]    Special Requests   Final    NONE Performed at [facility], [place] [address] [place], [place] [address]    Gram Stain   Final    MODERATE GRAM POSITIVE RODS RARE GRAM POSITIVE COCCI IN PAIRS RARE GRAM NEGATIVE RODS NO WBC SEEN Performed at [facility], [place] [address], [place], [place] [address]    Culture   Final    FEW CITROBACTER BRAAKII ABUNDANT STAPHYLOCOCCUS AUREUS    Report Status 08/08/2022 FINAL  Final   Organism ID, Bacteria CITROBACTER BRAAKII  Final   Organism ID, Bacteria STAPHYLOCOCCUS AUREUS  Final      Susceptibility   Citrobacter braakii - MIC*    CEFAZOLIN >=64 RESISTANT Resistant     CEFEPIME 0.5 SENSITIVE Sensitive     CEFTAZIDIME >=64 RESISTANT Resistant     CEFTRIAXONE >=64 RESISTANT Resistant     CIPROFLOXACIN <=0.25 SENSITIVE Sensitive     GENTAMICIN <=1 SENSITIVE Sensitive     IMIPENEM <=0.25 SENSITIVE Sensitive     TRIMETH/SULFA <=20 SENSITIVE Sensitive     PIP/TAZO 64 INTERMEDIATE Intermediate     * FEW CITROBACTER BRAAKII   Staphylococcus aureus - MIC*    CIPROFLOXACIN <=0.5 SENSITIVE Sensitive      ERYTHROMYCIN <=0.25 SENSITIVE Sensitive     GENTAMICIN <=0.5 SENSITIVE Sensitive     OXACILLIN <=0.25 SENSITIVE Sensitive     TETRACYCLINE <=1 SENSITIVE Sensitive     VANCOMYCIN <=0.5 SENSITIVE Sensitive     TRIMETH/SULFA <=10 SENSITIVE Sensitive     CLINDAMYCIN <=0.25 SENSITIVE Sensitive     RIFAMPIN <=0.5 SENSITIVE Sensitive     Inducible Clindamycin NEGATIVE Sensitive     * ABUNDANT STAPHYLOCOCCUS AUREUS     Time coordinating discharge: Over 30 minutes  SIGNED:   Phillips Climes,  MD  [facility] 08/13/2022, 4:13 PM Pager   If 7PM-7AM, please contact night-coverage [email or web address]

## 2022-08-13 NOTE — Progress Notes (Signed)
This nurse returned to attempt PICC line removal. Patient found to be resting with warm towel to PICC site. VAST nurse attempted to remove PICC line. Only able to pull out line to 14 cm. Notified nurse, Shirlean Mylar RN, to request MD to place order for IR to remove PICC line d/t vasoconstriction. VU. Fran Lowes, RN VAST

## 2022-08-13 NOTE — Progress Notes (Signed)
Nutrition Follow-up  DOCUMENTATION CODES:   Not applicable  INTERVENTION:   Discontinue ProSource TF Multivitamin w/ minerals daily Liberalize pt diet to regular due to malnutrition. Encourage good PO intake  NUTRITION DIAGNOSIS:   Moderate Malnutrition related to chronic illness (cirrhosis and esophageal varices) as evidenced by percent weight loss, mild fat depletion, mild muscle depletion. - Ongoing  GOAL:   Patient will meet greater than or equal to 90% of their needs - Progressing  MONITOR:   PO intake, Labs, Weight trends  REASON FOR ASSESSMENT:   Other (Comment) (verbal consult to start TPN)    ASSESSMENT:   Pt is a 41yo F with PMH of alcohol abuse, cirrhosis, recent upper GI bleed, DTs w/seizures, hepatic encephalopathy, HTN, and anxiety who presents with vomiting blood x1 day and several days of melena.  11/15 - Intubated 11/16 - unable to place NG tube due to esophogeal varices per GI 11/20 - pt NPO w/ no nutrition x5 days due to inability to place NGT. Plan to start TPN but GI later gave okay to place NGT so RN placed 11/21 - NGT dislodged 11/21 - NGT replaced, TF started 11/24 - reached goal TF of Vital 1.5 at 108m/hr 11/26 - pt self extubated; NG in place and TFs continued [facility] to Dysphagia 3 12/01 - NGT removed; rectal tube removed 12/02 - diet advanced to Heart Healthy   Pt sitting on edge of bed, RN in room. Pt reports that she is eating ok. RD noted multiple packs of pudding in pt room, pt shares that daughter brought them in for her. Pt declines any additional needs from RD.   Meal Intake 11/30-12/04: 0-100% x 8 meals (average 67.5%)  Medications reviewed and include: Folic Acid, NovoLog SSI, Lactulose, Protonix, Zinc Sulfate  Labs reviewed: Potassium 2.7, BUN <5, Creatinine 0.40, Magnesium 1.1, 24 hr CBGs 91-131  Diet Order:   Diet Order             Diet regular Room service appropriate? Yes; Fluid  consistency: Thin  Diet effective now                   EDUCATION NEEDS:   No education needs have been identified at this time  Skin:  Skin Assessment: Reviewed RN Assessment Skin Integrity Issues:: Unstageable Unstageable: tongue  Last BM:  12/4  Height:   Ht Readings from Last 1 Encounters:  07/28/22 5' 4"  (1.626 m)    Weight:   Wt Readings from Last 1 Encounters:  08/13/22 74.4 kg   BMI:  Body mass index is 28.15 kg/m.  Estimated Nutritional Needs:  Kcal:  1900-2250 kcal Protein:  85-110 grams Fluid:  >/= 1.9L    MHermina BartersRD, LDN Clinical Dietitian See [facility] contact information.

## 2022-08-13 NOTE — Progress Notes (Signed)
0612Date and time results received: 08/13/22 0612 (use smartphrase ".now" to insert current time)  Test: Potassium  Critical Value: 2.7  Name of Provider Notified: Velia Meyer, MD  Orders Received? Or Actions Taken?: Orders Received - See Orders for details Magnesium 1.1 MD notified

## 2022-08-13 NOTE — Progress Notes (Addendum)
TR[facility] night cross cover note:   I was notified by RN of potassium level of 2.7 this AM. Was 3.2 yesterday AM, with 80 mEq of po Kcl in the interval. Based  upon this, I ordered kcl 40 meq po q4h x 3 doses, and added on serum mag level.    Update: serum mag level 1.1. I've ordered initial mag supplementation in way of 2 g of iv magnesium sulfate over 2 hours x 1 dose, atlhough she may require more than this.    Babs Bertin, DO Hospitalist

## 2022-08-13 NOTE — Progress Notes (Addendum)
Orders received to pull PICC line. This nurse was able to pull approx. 11 cm out starting to note resistance. Had patient take some deep breaths, try to relax, place a warm blanket. Attempted again, however, unable to pull any more line, significant resistance. Taped line with some tension, covered with Tegaderm, and warm blanket and encouraged patient to relax and VAST would return to assess in 30 min. Notified Engineer, maintenance. Fran Lowes, RN VAST

## 2022-08-13 NOTE — Progress Notes (Signed)
OP PT Referral completed to [facility]. No other needs reported by patient.   Gilmore Laroche, MSW, [facility]

## 2022-08-23 ENCOUNTER — Encounter (HOSPITAL_COMMUNITY): Payer: Self-pay | Admitting: Gastroenterology

## 2022-08-29 ENCOUNTER — Telehealth: Payer: Self-pay

## 2022-08-29 NOTE — Telephone Encounter (Signed)
I did confirm with the pt that she is coming for her procedure tomorrow.

## 2022-08-29 NOTE — Telephone Encounter (Signed)
Thanks. GM

## 2022-08-29 NOTE — Telephone Encounter (Signed)
-----   Message from Irving Copas., MD sent at 08/29/2022  6:05 AM EST ----- Regarding: Follow up Joann [name], Please reach out to patient. Make sure that she is coming for her EGD tomorrow. If she is not coming tomorrow then at this point she will be dismissed from our practice and the typical protocol will need to be initiated in regards to documentation and letters. [facility] can then continue her care should she end up being hospitalized within the next 30 days but after that, she would have to seek GI care elsewhere and only be seen by [facility] if she were to become an unassigned patient at that particular hospital. Thanks. GM

## 2022-08-30 ENCOUNTER — Telehealth: Payer: Self-pay

## 2022-08-30 ENCOUNTER — Ambulatory Visit (HOSPITAL_COMMUNITY)
Admission: RE | Admit: 2022-08-30 | Discharge: 2022-08-30 | Disposition: A | Discharge status: Home / Self Care | Payer: Medicaid Other | Attending: Gastroenterology | Admitting: Gastroenterology

## 2022-08-30 ENCOUNTER — Encounter (HOSPITAL_COMMUNITY)
Admission: RE | Disposition: A | Discharge status: Home / Self Care | Payer: Self-pay | Source: Home / Self Care | Attending: Gastroenterology

## 2022-08-30 ENCOUNTER — Ambulatory Visit (HOSPITAL_COMMUNITY): Payer: Medicaid Other | Admitting: Anesthesiology

## 2022-08-30 ENCOUNTER — Other Ambulatory Visit: Payer: Self-pay

## 2022-08-30 ENCOUNTER — Ambulatory Visit (HOSPITAL_BASED_OUTPATIENT_CLINIC_OR_DEPARTMENT_OTHER): Payer: Medicaid Other | Admitting: Anesthesiology

## 2022-08-30 ENCOUNTER — Encounter (HOSPITAL_COMMUNITY): Payer: Self-pay | Admitting: Gastroenterology

## 2022-08-30 DIAGNOSIS — I8510 Secondary esophageal varices without bleeding: Secondary | ICD-10-CM

## 2022-08-30 DIAGNOSIS — K3189 Other diseases of stomach and duodenum: Secondary | ICD-10-CM

## 2022-08-30 DIAGNOSIS — I10 Essential (primary) hypertension: Secondary | ICD-10-CM

## 2022-08-30 DIAGNOSIS — I8500 Esophageal varices without bleeding: Secondary | ICD-10-CM | POA: Insufficient documentation

## 2022-08-30 DIAGNOSIS — K766 Portal hypertension: Secondary | ICD-10-CM | POA: Insufficient documentation

## 2022-08-30 DIAGNOSIS — K7030 Alcoholic cirrhosis of liver without ascites: Secondary | ICD-10-CM

## 2022-08-30 DIAGNOSIS — K7460 Unspecified cirrhosis of liver: Secondary | ICD-10-CM

## 2022-08-30 DIAGNOSIS — I8511 Secondary esophageal varices with bleeding: Secondary | ICD-10-CM

## 2022-08-30 DIAGNOSIS — D649 Anemia, unspecified: Secondary | ICD-10-CM

## 2022-08-30 DIAGNOSIS — K449 Diaphragmatic hernia without obstruction or gangrene: Secondary | ICD-10-CM | POA: Diagnosis not present

## 2022-08-30 DIAGNOSIS — F1090 Alcohol use, unspecified, uncomplicated: Secondary | ICD-10-CM

## 2022-08-30 HISTORY — PX: ESOPHAGEAL BANDING: SHX5518

## 2022-08-30 HISTORY — PX: ESOPHAGOGASTRODUODENOSCOPY (EGD) WITH PROPOFOL: SHX5813

## 2022-08-30 SURGERY — ESOPHAGOGASTRODUODENOSCOPY (EGD) WITH PROPOFOL
Anesthesia: Monitor Anesthesia Care

## 2022-08-30 MED ORDER — LACTULOSE 10 GM/15ML PO SOLN: 30.0000 g | Freq: Three times a day (TID) | ORAL | 0 refills | Status: DC

## 2022-08-30 MED ORDER — PROPOFOL 10 MG/ML IV BOLUS
INTRAVENOUS | Status: DC | PRN
  Administered 2022-08-30 (×2): 20 mg via INTRAVENOUS
  Administered 2022-08-30: 30 mg via INTRAVENOUS
  Administered 2022-08-30 (×3): 20 mg via INTRAVENOUS
  Administered 2022-08-30: 10 mg via INTRAVENOUS
  Administered 2022-08-30: 20 mg via INTRAVENOUS

## 2022-08-30 MED ORDER — ONDANSETRON HCL 4 MG/2ML IJ SOLN
INTRAMUSCULAR | Status: AC
  Filled 2022-08-30: qty 4

## 2022-08-30 MED ORDER — ONDANSETRON HCL 4 MG/2ML IJ SOLN
INTRAMUSCULAR | Status: DC | PRN
  Administered 2022-08-30: 8 mg via INTRAVENOUS

## 2022-08-30 MED ORDER — SODIUM CHLORIDE 0.9 % IV SOLN: INTRAVENOUS | Status: DC

## 2022-08-30 MED ORDER — PROPOFOL 500 MG/50ML IV EMUL
INTRAVENOUS | Status: DC | PRN
  Administered 2022-08-30: 145 ug/kg/min via INTRAVENOUS

## 2022-08-30 MED ORDER — LACTATED RINGERS IV SOLN: INTRAVENOUS | Status: DC

## 2022-08-30 SURGICAL SUPPLY — 15 items

## 2022-08-30 NOTE — Transfer of Care (Signed)
Immediate Anesthesia Transfer of Care Note  Patient: Joann [name]  Procedure(s) Performed: ESOPHAGOGASTRODUODENOSCOPY (EGD) WITH PROPOFOL ESOPHAGEAL BANDING  Patient Location: PACU  Anesthesia Type:MAC  Level of Consciousness: awake, alert , and oriented  Airway & Oxygen Therapy: Patient Spontanous Breathing and Patient connected to face mask oxygen  Post-op Assessment: Report given to RN, Post -op Vital signs reviewed and stable, and Patient moving all extremities X 4  Post vital signs: Reviewed and stable  Last Vitals:  Vitals Value Taken Time  BP 113/62   Temp    Pulse 96   Resp 20   SpO2 99     Last Pain:  Vitals:   08/30/22 1438  TempSrc: Temporal  PainSc: 0-No pain         Complications: No notable events documented.

## 2022-08-30 NOTE — Discharge Instructions (Signed)
YOU HAD AN ENDOSCOPIC PROCEDURE TODAY: Refer to the procedure report and other information in the discharge instructions given to you for any specific questions about what was found during the examination. If this information does not answer your questions, please call [place] office at [phone] to clarify.   YOU SHOULD EXPECT: Some feelings of bloating in the abdomen. Passage of more gas than usual. Walking can help get rid of the air that was put into your GI tract during the procedure and reduce the bloating. If you had a lower endoscopy (such as a colonoscopy or flexible sigmoidoscopy) you may notice spotting of blood in your stool or on the toilet paper. Some abdominal soreness may be present for a day or two, also.  DIET: Your first meal following the procedure should be a light meal and then it is ok to progress to your normal diet. A half-sandwich or bowl of soup is an example of a good first meal. Heavy or fried foods are harder to digest and may make you feel nauseous or bloated. Drink plenty of fluids but you should avoid alcoholic beverages for 24 hours. If you had a esophageal dilation, please see attached instructions for diet.    ACTIVITY: Your care partner should take you home directly after the procedure. You should plan to take it easy, moving slowly for the rest of the day. You can resume normal activity the day after the procedure however YOU SHOULD NOT DRIVE, use power tools, machinery or perform tasks that involve climbing or major physical exertion for 24 hours (because of the sedation medicines used during the test).   SYMPTOMS TO REPORT IMMEDIATELY: A gastroenterologist can be reached at any hour. Please call [phone]  for any of the following symptoms:  Following lower endoscopy (colonoscopy, flexible sigmoidoscopy) Excessive amounts of blood in the stool  Significant tenderness, worsening of abdominal pains  Swelling of the abdomen that is new, acute  Fever of 100 or  higher  Following upper endoscopy (EGD, EUS, ERCP, esophageal dilation) Vomiting of blood or coffee ground material  New, significant abdominal pain  New, significant chest pain or pain under the shoulder blades  Painful or persistently difficult swallowing  New shortness of breath  Black, tarry-looking or red, bloody stools  FOLLOW UP:  If any biopsies were taken you will be contacted by phone or by letter within the next 1-3 weeks. Call [phone]  if you have not heard about the biopsies in 3 weeks.  Please also call with any specific questions about appointments or follow up tests.YOU HAD AN ENDOSCOPIC PROCEDURE TODAY: Refer to the procedure report and other information in the discharge instructions given to you for any specific questions about what was found during the examination. If this information does not answer your questions, please call [place] office at [phone] to clarify.   YOU SHOULD EXPECT: Some feelings of bloating in the abdomen. Passage of more gas than usual. Walking can help get rid of the air that was put into your GI tract during the procedure and reduce the bloating. If you had a lower endoscopy (such as a colonoscopy or flexible sigmoidoscopy) you may notice spotting of blood in your stool or on the toilet paper. Some abdominal soreness may be present for a day or two, also.  DIET: Your first meal following the procedure should be a light meal and then it is ok to progress to your normal diet. A half-sandwich or bowl of soup is an example of a  good first meal. Heavy or fried foods are harder to digest and may make you feel nauseous or bloated. Drink plenty of fluids but you should avoid alcoholic beverages for 24 hours. If you had a esophageal dilation, please see attached instructions for diet.    ACTIVITY: Your care partner should take you home directly after the procedure. You should plan to take it easy, moving slowly for the rest of the day. You can resume  normal activity the day after the procedure however YOU SHOULD NOT DRIVE, use power tools, machinery or perform tasks that involve climbing or major physical exertion for 24 hours (because of the sedation medicines used during the test).   SYMPTOMS TO REPORT IMMEDIATELY: A gastroenterologist can be reached at any hour. Please call [phone]  for any of the following symptoms:  Following lower endoscopy (colonoscopy, flexible sigmoidoscopy) Excessive amounts of blood in the stool  Significant tenderness, worsening of abdominal pains  Swelling of the abdomen that is new, acute  Fever of 100 or higher  Following upper endoscopy (EGD, EUS, ERCP, esophageal dilation) Vomiting of blood or coffee ground material  New, significant abdominal pain  New, significant chest pain or pain under the shoulder blades  Painful or persistently difficult swallowing  New shortness of breath  Black, tarry-looking or red, bloody stools  FOLLOW UP:  If any biopsies were taken you will be contacted by phone or by letter within the next 1-3 weeks. Call [phone]  if you have not heard about the biopsies in 3 weeks.  Please also call with any specific questions about appointments or follow up tests.

## 2022-08-30 NOTE — Anesthesia Procedure Notes (Signed)
Procedure Name: MAC Date/Time: 08/30/2022 3:09 PM  Performed by: Niel Hummer, CRNAPre-anesthesia Checklist: Patient identified, Emergency Drugs available, Suction available and Patient being monitored Oxygen Delivery Method: Simple face mask

## 2022-08-30 NOTE — Op Note (Signed)
[facility] Patient Name: Joann [name] Procedure Date: 08/30/2022 MRN: [id number] Attending MD: Justice Britain , MD, [id number] Date of Birth: 23-Nov-1980 CSN: [id number] Age: 41 Admit Type: Outpatient Procedure:                Upper GI endoscopy Indications:              Esophageal varices, Follow-up of esophageal                            varices, For therapy of esophageal varices Providers:                Justice Britain, MD, Carlyn Reichert, RN, Fanny Skates RN, RN, Gloris Ham, Technician Referring MD:              Medicines:                Monitored Anesthesia Care Complications:            No immediate complications. Estimated Blood Loss:     Estimated blood loss: none. Procedure:                Pre-Anesthesia Assessment:                           - Prior to the procedure, a History and Physical                            was performed, and patient medications and                            allergies were reviewed. The patient's tolerance of                            previous anesthesia was also reviewed. The risks                            and benefits of the procedure and the sedation                            options and risks were discussed with the patient.                            All questions were answered, and informed consent                            was obtained. Prior Anticoagulants: The patient has                            taken no anticoagulant or antiplatelet agents. ASA                            Grade Assessment: III - A patient with severe  systemic disease. After reviewing the risks and                            benefits, the patient was deemed in satisfactory                            condition to undergo the procedure.                           After obtaining informed consent, the endoscope was                            passed under direct vision. Throughout the                             procedure, the patient's blood pressure, pulse, and                            oxygen saturations were monitored continuously. The                            [id number] ([id number]) Olympus therapeutic endoscope                            was introduced through the mouth, and advanced to                            the second part of duodenum. The upper GI endoscopy                            was accomplished without difficulty. The patient                            tolerated the procedure. Scope In: Scope Out: Findings:      No gross lesions were noted in the proximal esophagus and in the mid       esophagus.      Grade II and grade III varices were found in the distal esophagus. Three       bands were successfully placed with complete eradication, resulting in       deflation of varices. There was no bleeding during and at the end of the       procedure.      The Z-line was regular and was found 42 cm from the incisors.      A 2 cm hiatal hernia was present.      Moderate portal hypertensive gastropathy was found in the cardia, in the       gastric fundus and in the gastric body.      Patchy mildly erythematous mucosa without bleeding was found in the       entire examined stomach. Previously biopsied so not redone.      There is no endoscopic evidence of varices in the entire examined       stomach.      No gross lesions were noted in the duodenal bulb, in the first portion       of the duodenum  and in the second portion of the duodenum. Impression:               - No gross lesions in the proximal esophagus and in                            the mid esophagus.                           - Grade II and grade III esophageal varices                            distally. Completely eradicated with EVL band                            ligation.                           - Z-line regular, 42 cm from the incisors.                           - 2 cm hiatal hernia.                            - Portal hypertensive gastropathy proximally.                           - Erythematous mucosa in the stomach throughout                            (previously biopsied and negative for HP).                           - No gross lesions in the duodenal bulb, in the                            first portion of the duodenum and in the second                            portion of the duodenum. Moderate Sedation:      Not Applicable - Patient had care per Anesthesia. Recommendation:           - The patient will be observed post-procedure,                            until all discharge criteria are met.                           - Discharge patient to home.                           - Patient has a contact number available for                            emergencies. The signs and symptoms of potential  delayed complications were discussed with the                            patient. Return to normal activities tomorrow.                            Written discharge instructions were provided to the                            patient.                           - Full liquid diet today.                           - Continue twice daily PPI.                           - Continue Carafate 2-4 times daily.                           - Follow up in clinic to be arranged. If patient is                            doing well, then we will plan to initiate                            beta-blockade to further decrease risk of bleeding                            varices in the future.                           - Repeat upper endoscopy in approximately 4 weeks                            per protocol.                           - The findings and recommendations were discussed                            with the patient.                           - The findings and recommendations were discussed                            with the patient's family. Procedure Code(s):         --- Professional ---                           [phone], Esophagogastroduodenoscopy, flexible,                            transoral; with band ligation of esophageal/gastric  varices Diagnosis Code(s):        --- Professional ---                           I85.00, Esophageal varices without bleeding                           K44.9, Diaphragmatic hernia without obstruction or                            gangrene                           K76.6, Portal hypertension                           K31.89, Other diseases of stomach and duodenum CPT copyright 2022 American Medical Association. All rights reserved. The codes documented in this report are preliminary and upon coder review may  be revised to meet current compliance requirements. Justice Britain, MD 08/30/2022 3:44:30 PM Number of Addenda: 0

## 2022-08-30 NOTE — Telephone Encounter (Signed)
-----   Message from Irving Copas., MD sent at 08/30/2022  4:27 PM EST ----- Regarding: Follow-up Javon Snee, Please arrange a follow-up in clinic with one of the APP's or myself.  Okay to overbook if needed, hopefully before next EGD but if after that is okay to. She will need updated labs including a CBC/CMP/INR to be drawn in the next 1 to 2 weeks. Patient needs EGD for repeat variceal surveillance/protocol in 4 to 6 weeks with me. Thanks. GM

## 2022-08-30 NOTE — Anesthesia Preprocedure Evaluation (Addendum)
Anesthesia Evaluation  Patient identified by MRN, date of birth, ID band Patient awake    Reviewed: Allergy & Precautions, NPO status , Patient's Chart, lab work & pertinent test results  History of Anesthesia Complications Negative for: history of anesthetic complications  Airway Mallampati: II  TM Distance: >3 FB Neck ROM: Full    Dental  (+) Dental Advisory Given, Teeth Intact   Pulmonary neg pulmonary ROS   breath sounds clear to auscultation       Cardiovascular hypertension,  Rhythm:Regular     Neuro/Psych  PSYCHIATRIC DISORDERS Anxiety Depression    negative neurological ROS     GI/Hepatic PUD,,,(+) Cirrhosis   Esophageal Varices  substance abuse  alcohol use, Hepatitis -  Endo/Other  negative endocrine ROS    Renal/GU Lab Results      Component                Value               Date                      CREATININE               0.40 (L)            08/13/2022                Musculoskeletal   Abdominal   Peds  Hematology  (+) Blood dyscrasia, anemia Lab Results      Component                Value               Date                      WBC                      3.6 (L)             08/12/2022                HGB                      8.5 (L)             08/12/2022                HCT                      26.8 (L)            08/12/2022                MCV                      90.2                08/12/2022                PLT                      89 (L)              08/12/2022              Anesthesia Other Findings   Reproductive/Obstetrics  Anesthesia Physical Anesthesia Plan  ASA: 3  Anesthesia Plan: MAC   Post-op Pain Management:    Induction: Intravenous  PONV Risk Score and Plan: 2 and Propofol infusion and Treatment may vary due to age or medical condition  Airway Management Planned: Nasal Cannula and Natural Airway  Additional Equipment:  None  Intra-op Plan:   Post-operative Plan:   Informed Consent: I have reviewed the patients History and Physical, chart, labs and discussed the procedure including the risks, benefits and alternatives for the proposed anesthesia with the patient or authorized representative who has indicated his/her understanding and acceptance.     Dental advisory given  Plan Discussed with: CRNA  Anesthesia Plan Comments:        Anesthesia Quick Evaluation

## 2022-08-30 NOTE — H&P (Signed)
GASTROENTEROLOGY PROCEDURE H&P NOTE   Primary Care Physician: Elodia Florence., MD  HPI: Joann [name] is a 41 y.o. female who presents for EGD for variceal banding protocol.  Past Medical History:  Diagnosis Date   [redacted] weeks gestation of pregnancy 03/22/2020   Alcohol abuse    Alcohol withdrawal delirium ([place]) 02/15/2022   Alcohol-induced acute pancreatitis    Alcoholic ketoacidosis 68/08/7516   Anxiety    DTs (delirium tremens) ([place])    Elevated LFTs 10/24/2018   Encephalopathy acute    Hepatic encephalopathy ([place]) 12/24/2019   Hypertension    Hypokalemia 04/02/2019   Hypotension 02/24/2022   Liver disease    Past Surgical History:  Procedure Laterality Date   ECTOPIC PREGNANCY SURGERY     ESOPHAGEAL BANDING  07/25/2022   Procedure: ESOPHAGEAL BANDING;  Surgeon: Irving Copas., MD;  Location: [facility];  Service: Gastroenterology;;   ESOPHAGOGASTRODUODENOSCOPY (EGD) WITH PROPOFOL N/A 12/27/2019   Procedure: ESOPHAGOGASTRODUODENOSCOPY (EGD) WITH PROPOFOL;  Surgeon: Carol Ada, MD;  Location: [facility];  Service: Endoscopy;  Laterality: N/A;   ESOPHAGOGASTRODUODENOSCOPY (EGD) WITH PROPOFOL N/A 06/26/2022   Procedure: ESOPHAGOGASTRODUODENOSCOPY (EGD) WITH PROPOFOL;  Surgeon: Lavena Bullion, DO;  Location: [facility];  Service: Gastroenterology;  Laterality: N/A;   ESOPHAGOGASTRODUODENOSCOPY (EGD) WITH PROPOFOL N/A 07/25/2022   Procedure: ESOPHAGOGASTRODUODENOSCOPY (EGD) WITH PROPOFOL;  Surgeon: Rush Landmark Telford Nab., MD;  Location: [facility];  Service: Gastroenterology;  Laterality: N/A;   HEMOSTASIS CLIP PLACEMENT  07/25/2022   Procedure: HEMOSTASIS CLIP PLACEMENT;  Surgeon: Irving Copas., MD;  Location: [facility];  Service: Gastroenterology;;   HOT HEMOSTASIS N/A 07/25/2022   Procedure: HOT HEMOSTASIS (ARGON PLASMA COAGULATION/BICAP);  Surgeon: Irving Copas., MD;  Location: [facility];  Service: Gastroenterology;   Laterality: N/A;   IR RADIOLOGIST EVAL & MGMT  08/13/2022   Current Facility-Administered Medications  Medication Dose Route Frequency Provider Last Rate Last Admin   0.9 %  sodium chloride infusion   Intravenous Continuous Mansouraty, Telford Nab., MD       lactated ringers infusion   Intravenous Continuous Mansouraty, Telford Nab., MD 10 mL/hr at 08/30/22 1444 New Bag at 08/30/22 1444    Current Facility-Administered Medications:    0.9 %  sodium chloride infusion, , Intravenous, Continuous, Mansouraty, Telford Nab., MD   lactated ringers infusion, , Intravenous, Continuous, Mansouraty, Telford Nab., MD, Last Rate: 10 mL/hr at 08/30/22 1444, New Bag at 08/30/22 1444 Allergies  Allergen Reactions   Latex Hives   Family History  Problem Relation Age of Onset   Healthy Mother    Alcohol abuse Mother        quit in her 46s   Alcohol abuse Father    Diabetes Father    Healthy Brother    Hypertension Maternal Grandmother    Hypertension Maternal Grandfather    Healthy Brother    Healthy Brother    Social History   Socioeconomic History   Marital status: Married    Spouse name: Not on file   Number of children: Not on file   Years of education: Not on file   Highest education level: Not on file  Occupational History   Occupation: nurse  Tobacco Use   Smoking status: Never   Smokeless tobacco: Never  Vaping Use   Vaping Use: Never used  Substance and Sexual Activity   Alcohol use: Yes    Comment: "I just do it"   Drug use: Never   Sexual activity: Yes    Birth  control/protection: None  Other Topics Concern   Not on file  Social History Narrative   Not on file   Social Determinants of Health   Financial Resource Strain: Not on file  Food Insecurity: No Food Insecurity (08/09/2022)   Hunger Vital Sign    Worried About Running Out of Food in the Last Year: Never true    Ran Out of Food in the Last Year: Never true  Transportation Needs: No Transportation Needs  (08/09/2022)   PRAPARE - Hydrologist (Medical): No    Lack of Transportation (Non-Medical): No  Physical Activity: Not on file  Stress: Not on file  Social Connections: Not on file  Intimate Partner Violence: Not At Risk (08/09/2022)   Humiliation, Afraid, Rape, and Kick questionnaire    Fear of Current or Ex-Partner: No    Emotionally Abused: No    Physically Abused: No    Sexually Abused: No    Physical Exam: Today's Vitals   08/30/22 1438  BP: 124/65  Pulse: 95  Resp: 15  Temp: 98 F (36.7 C)  TempSrc: Temporal  SpO2: 97%  Weight: 63.5 kg  Height: 5' 4"  (1.626 m)  PainSc: 0-No pain   Body mass index is 24.03 kg/m. GEN: NAD EYE: Sclerae anicteric ENT: MMM CV: Non-tachycardic GI: Soft, NT/ND NEURO:  Alert & Oriented x 3  Lab Results: No results for input(s): "WBC", "HGB", "HCT", "PLT" in the last 72 hours. BMET No results for input(s): "NA", "K", "CL", "CO2", "GLUCOSE", "BUN", "CREATININE", "CALCIUM" in the last 72 hours. LFT No results for input(s): "PROT", "ALBUMIN", "AST", "ALT", "ALKPHOS", "BILITOT", "BILIDIR", "IBILI" in the last 72 hours. PT/INR No results for input(s): "LABPROT", "INR" in the last 72 hours.   Impression / Plan: This is a 41 y.o.female who presents for EGD for variceal banding protocol.  The risks and benefits of endoscopic evaluation/treatment were discussed with the patient and/or family; these include but are not limited to the risk of perforation, infection, bleeding, missed lesions, lack of diagnosis, severe illness requiring hospitalization, as well as anesthesia and sedation related illnesses.  The patient's history has been reviewed, patient examined, no change in status, and deemed stable for procedure.  The patient and/or family is agreeable to proceed.    Justice Britain, MD [facility]oscopy Office # [phone]

## 2022-08-31 ENCOUNTER — Other Ambulatory Visit: Payer: Self-pay

## 2022-08-31 DIAGNOSIS — I864 Gastric varices: Secondary | ICD-10-CM

## 2022-08-31 DIAGNOSIS — K7030 Alcoholic cirrhosis of liver without ascites: Secondary | ICD-10-CM

## 2022-08-31 NOTE — Telephone Encounter (Signed)
Lab order entered  Follow up scheduled for 10/10/22 at 230 pm  EGD setup for 10/08/22 at 230 pm at [facility] with GM   Left message on machine to call back

## 2022-08-31 NOTE — Telephone Encounter (Signed)
EUS scheduled, pt instructed and medications reviewed.  Patient instructions mailed to home.  Patient to call with any questions or concerns.   The pt has been advised of the information and verbalized understanding.

## 2022-08-31 NOTE — Anesthesia Postprocedure Evaluation (Signed)
Anesthesia Post Note  Patient: Joann [name]  Procedure(s) Performed: ESOPHAGOGASTRODUODENOSCOPY (EGD) WITH PROPOFOL ESOPHAGEAL BANDING     Patient location during evaluation: Endoscopy Anesthesia Type: MAC Level of consciousness: awake and alert Pain management: pain level controlled Vital Signs Assessment: post-procedure vital signs reviewed and stable Respiratory status: spontaneous breathing, nonlabored ventilation and respiratory function stable Cardiovascular status: stable and blood pressure returned to baseline Postop Assessment: no apparent nausea or vomiting Anesthetic complications: no  No notable events documented.  Last Vitals:  Vitals:   08/30/22 1550 08/30/22 1610  BP: 112/77 131/83  Pulse: 92 88  Resp: 14 18  Temp:    SpO2: 97% 97%    Last Pain:  Vitals:   08/30/22 1610  TempSrc:   PainSc: 0-No pain                 Joann [name]

## 2022-09-03 ENCOUNTER — Encounter (HOSPITAL_COMMUNITY): Payer: Self-pay | Admitting: Gastroenterology

## 2022-10-01 ENCOUNTER — Telehealth: Payer: Self-pay

## 2022-10-01 NOTE — Telephone Encounter (Signed)
-----  Message from Irving Copas., MD sent at 09/29/2022  4:48 AM EST ----- Regarding: EGD no longer needed Joann [name], This patient is currently admitted at [facility] and underwent a TIPS procedure. She will not need the EGD scheduled for 29 January.  Thus we can release that. I would go ahead and just set her up (you do not have to reach out to her but they will be able to see it at [facility]) for a clinic visit with one of our APP's or myself in 4 weeks in clinic. Hold the slot until Tuesday or Wednesday so that we see if we get any other EUS needs otherwise we will be able to try and find someone to go into that slot so we do not waste it. Thanks. GM

## 2022-10-01 NOTE — Telephone Encounter (Signed)
Pt appt cancelled for EGD and office visit scheduled for 1/31.

## 2022-10-08 ENCOUNTER — Encounter (HOSPITAL_COMMUNITY): Payer: Self-pay

## 2022-10-08 ENCOUNTER — Ambulatory Visit (HOSPITAL_COMMUNITY): Admit: 2022-10-08 | Payer: Medicaid Other | Admitting: Gastroenterology

## 2022-10-08 SURGERY — ESOPHAGOGASTRODUODENOSCOPY (EGD) WITH PROPOFOL
Anesthesia: Monitor Anesthesia Care

## 2022-10-10 ENCOUNTER — Ambulatory Visit: Payer: Medicaid Other | Admitting: Gastroenterology

## 2022-10-30 NOTE — Progress Notes (Unsigned)
10/30/2022 Joann [name] [id number] 07-24-1981   Chief Complaint:  History of Present Illness: Joann [name] is a 42 y.o. 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  Sev1/10/24 Hx of grade II-III varices noted on EGD in 07/2022; had 6 bands placed. Repeat EGD in 08/2022 with 3 more bands placed. Presented this admission with 2 week hx of melena and coffee ground emesis. Patient underwent EGD with GI on 1/11 evening which showed LA grade C esophagitis with no bleeding, esophageal ulcers with no stigmata c/w post banding ulcers, and no EoV, moderate PHG. However, post EGD, patient developed large volume hematemesis with HD instability. Patient underwent repeat EGD which showed esophageal ulceration with oozing visible vessel that was clipped. Also noted small EoV that was actively bleeding, which was banded. Underwent uncomplicated TIPS on [id number] with reduction in portosystemic gradient from 21 to 13 mmHg. She has completed 5 days of SBP ppx in setting of bleed. Octreotide discontinued post TIPS. No longer on pressor therapy. Up-titration of midodrine to 20 mg TID on 1/20. Post-TIPS, her bilirubin uptrended from 3.4 gradually up to 11 at discharge (mixed, 6.3 conjugated, 4.4 unconjugated). INR stable at 2.4 and LFTs downtrended to AST/ALT 103/13.    EGD 10/08/2022 was canceled as underwent TIPS procedure 09/2022 at [facility].    Upper Endoscopy (EGD) 09/21/2022 - Esophageal ulcers at site of prior variceal treatment. - Esophageal ulcer at site of prior variceal treatment. Clip (MR conditional) was placed at site of small oozing visible vessel. - Grade I and small (< 5 mm)  esophageal varices actively bleeding with white nipple sign. Difficult to appreciate in absence of bleeding as the colum was largely decompressed at time of endoscopy. Surrounding scarring made banding technically challenging. Completely eradicated. Banded. - Medium-sized hiatal hernia. - Portal hypertensive gastropathy. - Red blood in the entire stomach. - Duodenal foreign body. - Blood in the entire examined duodenum. - No specimens collected.  IR TIPS insertion 09/27/2022 at [facility]: IMPRESSION:  Successful TIPS creation using ICE guidance. A 10 mm diameter 5 cm covered/2 cm uncovered Viatorr stent was deployed from the common ostia of the right and middle hepatic artery to the left portal vein with reduction in the portosystemic gradient from 21 mmHg to 13 mmHg.     EGD 08/30/2022 at [facility]:  PPI Bid, carafae 2 to 4 times daily Future beta blocker  Repeat EGD in 4 weeks  Current Medications, Allergies, Past Medical History, Past Surgical History, Family History and Social History were reviewed in [employer] record.   Review of Systems:   Constitutional: Negative for fever, sweats, chills or weight loss.  Respiratory: Negative for shortness of breath.   Cardiovascular: Negative for chest pain, palpitations and leg swelling.  Gastrointestinal: See HPI.  Musculoskeletal: Negative for back pain or muscle aches.  Neurological: Negative for dizziness, headaches or paresthesias.    Physical Exam: There were no vitals taken for this visit. General: in no acute distress. Head: Normocephalic and atraumatic. Eyes: No scleral icterus. Conjunctiva pink . Ears: Normal auditory acuity. Mouth: Dentition intact. No ulcers or lesions.  Lungs: Clear throughout to auscultation. Heart: Regular rate and rhythm, no murmur. Abdomen: Soft, nontender and nondistended. No masses or hepatomegaly. Normal bowel  sounds x 4 quadrants.  Rectal: *** Musculoskeletal:  Symmetrical with no gross deformities. Extremities: No edema. Neurological: Alert oriented x 4. No focal deficits.  Psychological: Alert and cooperative. Normal mood and affect  Assessment and Recommendations: ***

## 2022-10-31 ENCOUNTER — Emergency Department (HOSPITAL_COMMUNITY): Payer: Medicaid Other

## 2022-10-31 ENCOUNTER — Emergency Department (HOSPITAL_COMMUNITY)
Admission: EM | Admit: 2022-10-31 | Discharge: 2022-10-31 | Discharge status: Against Medical Advice | Payer: Medicaid Other | Attending: Emergency Medicine | Admitting: Emergency Medicine

## 2022-10-31 ENCOUNTER — Other Ambulatory Visit: Payer: Self-pay

## 2022-10-31 ENCOUNTER — Ambulatory Visit: Payer: Medicaid Other | Admitting: Nurse Practitioner

## 2022-10-31 ENCOUNTER — Encounter (HOSPITAL_COMMUNITY): Payer: Self-pay | Admitting: Emergency Medicine

## 2022-10-31 DIAGNOSIS — D696 Thrombocytopenia, unspecified: Secondary | ICD-10-CM

## 2022-10-31 DIAGNOSIS — R531 Weakness: Secondary | ICD-10-CM | POA: Insufficient documentation

## 2022-10-31 DIAGNOSIS — R9431 Abnormal electrocardiogram [ECG] [EKG]: Secondary | ICD-10-CM

## 2022-10-31 DIAGNOSIS — Z5321 Procedure and treatment not carried out due to patient leaving prior to being seen by health care provider: Secondary | ICD-10-CM | POA: Insufficient documentation

## 2022-10-31 DIAGNOSIS — N390 Urinary tract infection, site not specified: Secondary | ICD-10-CM

## 2022-10-31 DIAGNOSIS — F1010 Alcohol abuse, uncomplicated: Secondary | ICD-10-CM

## 2022-10-31 DIAGNOSIS — R109 Unspecified abdominal pain: Secondary | ICD-10-CM | POA: Diagnosis not present

## 2022-10-31 DIAGNOSIS — E876 Hypokalemia: Secondary | ICD-10-CM

## 2022-10-31 DIAGNOSIS — R188 Other ascites: Secondary | ICD-10-CM

## 2022-10-31 LAB — COMPREHENSIVE METABOLIC PANEL
ALT: 17 U/L (ref 0–44)
AST: 216 U/L — ABNORMAL HIGH (ref 15–41)
Albumin: 2.1 g/dL — ABNORMAL LOW (ref 3.5–5.0)
Alkaline Phosphatase: 134 U/L — ABNORMAL HIGH (ref 38–126)
Anion gap: 19 — ABNORMAL HIGH (ref 5–15)
BUN: 8 mg/dL (ref 6–20)
CO2: 27 mmol/L (ref 22–32)
Calcium: 6.8 mg/dL — ABNORMAL LOW (ref 8.9–10.3)
Chloride: 80 mmol/L — ABNORMAL LOW (ref 98–111)
Creatinine, Ser: 1.42 mg/dL — ABNORMAL HIGH (ref 0.44–1.00)
GFR, Estimated: 47 mL/min — ABNORMAL LOW (ref >60)
Glucose, Bld: 119 mg/dL — ABNORMAL HIGH (ref 70–99)
Potassium: <2 mmol/L — CL (ref 3.5–5.1)
Sodium: 126 mmol/L — ABNORMAL LOW (ref 135–145)
Total Bilirubin: 13.6 mg/dL — ABNORMAL HIGH (ref 0.3–1.2)
Total Protein: 8.5 g/dL — ABNORMAL HIGH (ref 6.5–8.1)

## 2022-10-31 LAB — URINALYSIS, ROUTINE W REFLEX MICROSCOPIC
Glucose, UA: NEGATIVE mg/dL
Hgb urine dipstick: NEGATIVE
Ketones, ur: NEGATIVE mg/dL
Nitrite: NEGATIVE
Protein, ur: 30 mg/dL — AB
Specific Gravity, Urine: 1.01 (ref 1.005–1.030)
WBC, UA: >50 WBC/hpf (ref 0–5)
pH: 6 (ref 5.0–8.0)

## 2022-10-31 LAB — CBC WITH DIFFERENTIAL/PLATELET
Abs Immature Granulocytes: 0.01 10*3/uL (ref 0.00–0.07)
Basophils Absolute: 0 10*3/uL (ref 0.0–0.1)
Basophils Relative: 0 %
Eosinophils Absolute: 0 10*3/uL (ref 0.0–0.5)
Eosinophils Relative: 0 %
HCT: 25.2 % — ABNORMAL LOW (ref 36.0–46.0)
Hemoglobin: 8.1 g/dL — ABNORMAL LOW (ref 12.0–15.0)
Immature Granulocytes: 0 %
Lymphocytes Relative: 26 %
Lymphs Abs: 0.9 10*3/uL (ref 0.7–4.0)
MCH: 32.4 pg (ref 26.0–34.0)
MCHC: 32.1 g/dL (ref 30.0–36.0)
MCV: 100.8 fL — ABNORMAL HIGH (ref 80.0–100.0)
Monocytes Absolute: 0.4 10*3/uL (ref 0.1–1.0)
Monocytes Relative: 12 %
Neutro Abs: 2 10*3/uL (ref 1.7–7.7)
Neutrophils Relative %: 62 %
Platelets: 26 10*3/uL — CL (ref 150–400)
RBC: 2.5 MIL/uL — ABNORMAL LOW (ref 3.87–5.11)
RDW: 23.3 % — ABNORMAL HIGH (ref 11.5–15.5)
WBC: 3.3 10*3/uL — ABNORMAL LOW (ref 4.0–10.5)
nRBC: 0 % (ref 0.0–0.2)

## 2022-10-31 LAB — I-STAT BETA HCG BLOOD, ED (MC, WL, AP ONLY): I-stat hCG, quantitative: <5 m[IU]/mL (ref <5)

## 2022-10-31 LAB — PROTIME-INR
INR: 2.4 — ABNORMAL HIGH (ref 0.8–1.2)
Prothrombin Time: 25.7 seconds — ABNORMAL HIGH (ref 11.4–15.2)

## 2022-10-31 LAB — MAGNESIUM: Magnesium: 1.1 mg/dL — ABNORMAL LOW (ref 1.7–2.4)

## 2022-10-31 LAB — LIPASE, BLOOD: Lipase: 49 U/L (ref 11–51)

## 2022-10-31 LAB — AMMONIA: Ammonia: 62 umol/L — ABNORMAL HIGH (ref 9–35)

## 2022-10-31 LAB — ETHANOL: Alcohol, Ethyl (B): 191 mg/dL — ABNORMAL HIGH (ref <10)

## 2022-10-31 MED ORDER — POTASSIUM CHLORIDE CRYS ER 20 MEQ PO TBCR: 40.0000 meq | EXTENDED_RELEASE_TABLET | Freq: Every day | ORAL | 0 refills | Status: DC

## 2022-10-31 MED ORDER — DISULFIRAM 250 MG PO TABS: 250.0000 mg | ORAL_TABLET | Freq: Every day | ORAL | 0 refills | Status: DC

## 2022-10-31 MED ORDER — LACTULOSE 10 GM/15ML PO SOLN
30.0000 g | Freq: Once | ORAL | Status: AC
  Administered 2022-10-31: 30 g via ORAL
  Filled 2022-10-31: qty 60

## 2022-10-31 MED ORDER — IOHEXOL 300 MG/ML  SOLN
80.0000 mL | Freq: Once | INTRAMUSCULAR | Status: AC | PRN
  Administered 2022-10-31: 80 mL via INTRAVENOUS

## 2022-10-31 MED ORDER — SODIUM CHLORIDE 0.9 % IV BOLUS
1000.0000 mL | Freq: Once | INTRAVENOUS | Status: AC
  Administered 2022-10-31: 1000 mL via INTRAVENOUS

## 2022-10-31 MED ORDER — MAGNESIUM SULFATE 2 GM/50ML IV SOLN
2.0000 g | Freq: Once | INTRAVENOUS | Status: AC
  Administered 2022-10-31: 2 g via INTRAVENOUS
  Filled 2022-10-31: qty 50

## 2022-10-31 MED ORDER — POTASSIUM CHLORIDE 10 MEQ/100ML IV SOLN
10.0000 meq | INTRAVENOUS | Status: DC
  Administered 2022-10-31: 10 meq via INTRAVENOUS
  Filled 2022-10-31: qty 100

## 2022-10-31 MED ORDER — SODIUM CHLORIDE 0.9 % IV SOLN
1.0000 g | Freq: Once | INTRAVENOUS | Status: AC
  Administered 2022-10-31: 1 g via INTRAVENOUS
  Filled 2022-10-31: qty 10

## 2022-10-31 MED ORDER — CHLORDIAZEPOXIDE HCL 25 MG PO CAPS: ORAL_CAPSULE | ORAL | 0 refills | Status: DC

## 2022-10-31 MED ORDER — CEPHALEXIN 500 MG PO CAPS: 500.0000 mg | ORAL_CAPSULE | Freq: Three times a day (TID) | ORAL | 0 refills | Status: DC

## 2022-10-31 NOTE — ED Notes (Signed)
Patient transported to CT 

## 2022-10-31 NOTE — ED Triage Notes (Signed)
Last BM 4 days ago. Pt takes lactulose every other day but has not been able to have BM.

## 2022-10-31 NOTE — Discharge Instructions (Addendum)
You have multiple issues that we uncovered during the ED stay.  Your potassium and magnesium are low.  I have given you IV magnesium and potassium but you will need prescription of potassium.  You also have a urinary tract infection I have prescribed Keflex 3 times a day for 5 days  You also have alcohol in your system.  As you have requested, I have prescribed Antabuse 250 mg daily.  If you try to drink alcohol, you will have severe vomiting.  You can take Librium as needed to help with withdrawal symptoms  As we discussed, you decided to sign out [place] so please see your doctor this week  Return to ER if you have withdrawal symptoms, severe abdominal pain or vomiting

## 2022-10-31 NOTE — ED Notes (Signed)
Pt stated she is not staying the night. Pt said I just want some medication to take home and resources to get help with alcohol. Advised EDP of pt statement. EDP to pt room and advised that due to abnormal lab work he would not be able to discharge home and pt would have to sign out AMA. Pt agreed to sign out AMA and  agreed to stay long enough to get some potassium and magnesium. EDP spoke in great length with pt and spouse and pt adamant that she will not be staying and needs to be home with her children.

## 2022-10-31 NOTE — ED Provider Notes (Signed)
[facility] Provider Note   CSN: [id number] Arrival date & time: 10/31/22  1453     History  Chief Complaint  Patient presents with   Weakness   Emesis   Alcohol Problem    Joann [name] is a 42 y.o. female history of alcohol cirrhosis, esophageal varices here presenting with abdominal pain and weakness and dizziness.  Patient has esophageal varices and had bleeding varices a month ago and states that she had banding done and also a TIPS procedure.  Patient states that she is compliant with her meds but feels weak and tired.  She states that she is taking her lactulose but had no bowel movements for 3 days.  Patient feels that he is she is unsteady.  Patient also feel weak and tired but drink some alcohol today.  The history is provided by the patient.       Home Medications Prior to Admission medications   Medication Sig Start Date End Date Taking? Authorizing Provider  ferrous sulfate 325 (65 FE) MG tablet Take 1 tablet (325 mg total) by mouth daily before breakfast. Take with a cup of juice 07/03/22 10/31/22 Yes Shalhoub, Sherryll Burger, MD  folic acid (FOLVITE) 1 MG tablet Take 1 tablet (1 mg total) by mouth daily. 07/03/22  Yes Shalhoub, Sherryll Burger, MD  hydrocortisone 2.5 % ointment Apply 1 Application topically daily as needed (to affected site(s) as directed for itching). 10/04/22 11/03/22 Yes [provider]  lactulose (CHRONULAC) 10 GM/15ML solution Take 45 mLs (30 g total) by mouth 3 (three) times daily. Take up to 3-times daily to have 3 bowel movements per day.  May hold last dose if she is already having her bowel movement quota. Patient taking differently: Take 20 g by mouth daily. 08/30/22  Yes Mansouraty, Telford Nab., MD  Multiple Vitamin (MULTIVITAMIN WITH MINERALS) TABS tablet Take 1 tablet by mouth daily. Patient taking differently: Take 1 tablet by mouth daily with breakfast. 01/15/20  Yes Angiulli, Lavon Paganini, PA-C   pantoprazole (PROTONIX) 40 MG tablet Take 1 tablet (40 mg total) by mouth 2 (two) times daily. Patient taking differently: Take 40 mg by mouth at bedtime. 07/03/22  Yes Shalhoub, Sherryll Burger, MD  potassium chloride SA (KLOR-CON M) 20 MEQ tablet Take 1 tablet (20 mEq total) by mouth daily. Patient taking differently: Take 20 mEq by mouth 3 (three) times a week. 08/13/22  Yes Elgergawy, Silver Huguenin, MD  rifaximin (XIFAXAN) 550 MG TABS tablet Take 1 tablet (550 mg total) by mouth 2 (two) times daily. 08/13/22  Yes Elgergawy, Silver Huguenin, MD  sucralfate (CARAFATE) 1 g tablet Take 1 tablet (1 g total) by mouth 4 (four) times daily -  with meals and at bedtime. 07/03/22  Yes Shalhoub, Sherryll Burger, MD  thiamine (VITAMIN B-1) 100 MG tablet Take 1 tablet (100 mg total) by mouth daily. 07/04/22  Yes Shalhoub, Sherryll Burger, MD  traZODone (DESYREL) 150 MG tablet Take 1 tablet (150 mg total) by mouth at bedtime as needed for sleep. 08/13/22  Yes Elgergawy, Silver Huguenin, MD  propranolol (INDERAL) 20 MG tablet Take 1 tablet (20 mg total) by mouth 2 (two) times daily. Patient not taking: Reported on 10/31/2022 08/13/22   Elgergawy, Silver Huguenin, MD      Allergies    Latex and Tape    Review of Systems   Review of Systems  Gastrointestinal:  Positive for vomiting.  Neurological:  Positive for weakness.  All other  systems reviewed and are negative.   Physical Exam Updated Vital Signs BP (!) 97/57   Pulse 100   Temp 98.3 F (36.8 C) (Oral)   Resp 15   LMP 09/01/2022 (Approximate)   SpO2 100%  Physical Exam Vitals and nursing note reviewed.  Constitutional:      Comments: Weak and tired and older than stated age  HENT:     Head: Normocephalic.     Nose: Nose normal.     Mouth/Throat:     Mouth: Mucous membranes are dry.  Eyes:     Extraocular Movements: Extraocular movements intact.     Pupils: Pupils are equal, round, and reactive to light.  Cardiovascular:     Rate and Rhythm: Normal rate and regular rhythm.      Pulses: Normal pulses.     Heart sounds: Normal heart sounds.  Pulmonary:     Effort: Pulmonary effort is normal.     Breath sounds: Normal breath sounds.  Abdominal:     Comments: Slightly distended and nontender  Genitourinary:    Comments: Rectal exam done with chaperone and there is no stool impaction Musculoskeletal:        General: Normal range of motion.     Cervical back: Normal range of motion and neck supple.  Skin:    General: Skin is warm.     Capillary Refill: Capillary refill takes less than 2 seconds.  Neurological:     General: No focal deficit present.     Mental Status: She is oriented to person, place, and time.     Comments: Patient has normal strength and sensation bilateral arms and legs.  Patient has asterixis on exam  Psychiatric:        Mood and Affect: Mood normal.        Behavior: Behavior normal.     ED Results / Procedures / Treatments   Labs (all labs ordered are listed, but only abnormal results are displayed) Labs Reviewed  CBC WITH DIFFERENTIAL/PLATELET - Abnormal; Notable for the following components:      Result Value   WBC 3.3 (*)    RBC 2.50 (*)    Hemoglobin 8.1 (*)    HCT 25.2 (*)    MCV 100.8 (*)    RDW 23.3 (*)    Platelets 26 (*)    All other components within normal limits  COMPREHENSIVE METABOLIC PANEL - Abnormal; Notable for the following components:   Sodium 126 (*)    Potassium <2.0 (*)    Chloride 80 (*)    Glucose, Bld 119 (*)    Creatinine, Ser 1.42 (*)    Calcium 6.8 (*)    Total Protein 8.5 (*)    Albumin 2.1 (*)    AST 216 (*)    Alkaline Phosphatase 134 (*)    Total Bilirubin 13.6 (*)    GFR, Estimated 47 (*)    Anion gap 19 (*)    All other components within normal limits  ETHANOL - Abnormal; Notable for the following components:   Alcohol, Ethyl (B) 191 (*)    All other components within normal limits  AMMONIA - Abnormal; Notable for the following components:   Ammonia 62 (*)    All other components  within normal limits  PROTIME-INR - Abnormal; Notable for the following components:   Prothrombin Time 25.7 (*)    INR 2.4 (*)    All other components within normal limits  MAGNESIUM - Abnormal; Notable for the following components:  Magnesium 1.1 (*)    All other components within normal limits  URINALYSIS, ROUTINE W REFLEX MICROSCOPIC - Abnormal; Notable for the following components:   Color, Urine AMBER (*)    APPearance HAZY (*)    Bilirubin Urine MODERATE (*)    Protein, ur 30 (*)    Leukocytes,Ua MODERATE (*)    Bacteria, UA RARE (*)    All other components within normal limits  LIPASE, BLOOD  I-STAT CHEM 8, ED  I-STAT BETA HCG BLOOD, ED ([facility], [facility], [facility] ONLY)  TYPE AND SCREEN    EKG EKG Interpretation  Date/Time:  Wednesday October 31 2022 16:16:23 EST Ventricular Rate:  102 PR Interval:    QRS Duration: 114 QT Interval:  448 QTC Calculation: 584 R Axis:   38 Text Interpretation: Atrial fibrillation Borderline intraventricular conduction delay Prolonged QT interval Prolonged QT new since previous Confirmed by Wandra Arthurs [phone]) on 10/31/2022 4:18:17 PM  Radiology CT ABDOMEN PELVIS W CONTRAST  Result Date: 10/31/2022 CLINICAL DATA:  Abdominal pain, acute, nonlocalized hx of varices, recent tips procedure, constipation. weakness, fatigue increased jaundiced. Pt was admitted to [facility] 1 month ago for varices and had a stent placed in liver. PT states she has had vomting x 3 days. Drank a pint of liquor and 1 beer EXAM: CT ABDOMEN AND PELVIS WITH CONTRAST TECHNIQUE: Multidetector CT imaging of the abdomen and pelvis was performed using the standard protocol following bolus administration of intravenous contrast. RADIATION DOSE REDUCTION: This exam was performed according to the departmental dose-optimization program which includes automated exposure control, adjustment of the mA and/or kV according to patient size and/or use of iterative reconstruction technique. CONTRAST:  22m  OMNIPAQUE IOHEXOL 300 MG/ML  SOLN COMPARISON:  None Available. FINDINGS: Lower chest: Distal esophageal wall thickening. Paraesophageal varices. Patulous esophagus with a fluid-filled lumen. Hepatobiliary: Nodular hepatic contour. Enlarged liver measuring up to 20.5 cm. TIPS procedure. Heterogeneous hepatic parenchyma. No focal liver abnormality. Status post cholecystectomy. No biliary dilatation. Pancreas: No focal lesion. Normal pancreatic contour. No surrounding inflammatory changes. No main pancreatic ductal dilatation. Spleen: Normal in size without focal abnormality. Adrenals/Urinary Tract: No adrenal nodule bilaterally. Bilateral kidneys enhance symmetrically. Fluid density lesions likely represent simple renal cysts. 2 mm calcified stone within the left kidney. No hydronephrosis. No hydroureter. The urinary bladder is unremarkable. Stomach/Bowel: Stomach is within normal limits. No evidence of bowel wall thickening or dilatation. Appendix appears normal. Vascular/Lymphatic: No abdominal aorta or iliac aneurysm. No abdominal, pelvic, or inguinal lymphadenopathy. Reproductive: Uterus and bilateral adnexa are unremarkable. Other: No intraperitoneal free fluid. No intraperitoneal free gas. No organized fluid collection. Musculoskeletal: No abdominal wall hernia or abnormality. No suspicious lytic or blastic osseous lesions. No acute displaced fracture. Multilevel degenerative changes of the spine. IMPRESSION: 1. Cirrhosis portal hypertension. Status post TIPS procedure. Heterogeneous hepatic parenchyma with regenerated nodules and/or underlying mass is not excluded. No focal liver lesions identified. Please note that liver protocol enhanced MR and CT are the most sensitive tests for the screening detection of hepatocellular carcinoma in the high risk setting of cirrhosis. 2. Patulous distal esophagus with wall thickening. Associated paraesophageal varices. Consider direct visualization. 3. Nonobstructive 2 mm  left nephrolithiasis. Electronically Signed   By: MIven FinnM.D.   On: 10/31/2022 19:11   CT HEAD WO CONTRAST (5MM)  Result Date: 10/31/2022 CLINICAL DATA:  Mental status change of unknown cause. Weakness. Falling. Liver disease. EXAM: CT HEAD WITHOUT CONTRAST TECHNIQUE: Contiguous axial images were obtained from the base of the  skull through the vertex without intravenous contrast. RADIATION DOSE REDUCTION: This exam was performed according to the departmental dose-optimization program which includes automated exposure control, adjustment of the mA and/or kV according to patient size and/or use of iterative reconstruction technique. COMPARISON:  07/26/2022 FINDINGS: Brain: Mild volume loss considering age. No evidence of old or acute focal infarction, mass lesion, hemorrhage, hydrocephalus or extra-axial collection. Vascular: No abnormal vascular finding. Skull: Normal Sinuses/Orbits: Clear/normal Other: None IMPRESSION: No acute or focal finding. Mild volume loss considering age. Electronically Signed   By: Nelson Chimes M.D.   On: 10/31/2022 19:03    Procedures Procedures    Medications Ordered in ED Medications  potassium chloride 10 mEq in 100 mL IVPB (10 mEq Intravenous New Bag/Given 10/31/22 1929)  magnesium sulfate IVPB 2 g 50 mL (2 g Intravenous New Bag/Given 10/31/22 1929)  sodium chloride 0.9 % bolus 1,000 mL (0 mLs Intravenous Stopped 10/31/22 1808)  lactulose (CHRONULAC) 10 GM/15ML solution 30 g (30 g Oral Given 10/31/22 1803)  cefTRIAXone (ROCEPHIN) 1 g in sodium chloride 0.9 % 100 mL IVPB (0 g Intravenous Stopped 10/31/22 1952)  iohexol (OMNIPAQUE) 300 MG/ML solution 80 mL (80 mLs Intravenous Contrast Given 10/31/22 1833)    ED Course/ Medical Decision Making/ A&P                             Medical Decision Making ESTERLENE SIMI is a 42 y.o. female here presenting with weakness and vomiting and constipation.  Patient has history of esophageal varices but has no hematemesis or  blood in her stool this time.  Patient also is a chronic alcoholic and continues to drink alcohol.  I am concerned that she may have hepatic encephalopathy versus alcohol intoxication versus pancreatitis versus worsening cirrhosis.  Plan to get CBC and CMP and ammonia level and CT head and CT abdomen pelvis.  Patient will likely need admission.  7:58 PM Patient's platelet count is 26.  Potassium is less than 2 and patient has prolonged QTc.  Magnesium is 1.1.  UA showed greater than 50 white blood cell count.  CT showed known cirrhosis and status post TIPS procedure.  We had a long conversation about goals of care.  I recommend that she be admitted for IV antibiotics and also IV potassium and magnesium.  Patient states that she has no childcare and wants to go home.  I told her that her potassium is so low that she will need at least 24 hours of potassium supplementation.  She states that she would just want some potassium pill and she wants to quit drinking alcohol so wants me to prescribe Antabuse and Librium.  I told her that she will need to sign out [place].  Her husband is at bedside and she understands the risks of signing out against medical vice with multiple electrolyte abnormalities.  Problems Addressed: Alcohol abuse: chronic illness or injury Cirrhosis of liver with ascites, unspecified hepatic cirrhosis type [facility]): chronic illness or injury with exacerbation, progression, or side effects of treatment Hypokalemia: acute illness or injury Hypomagnesemia: acute illness or injury Low platelet count ([place]): acute illness or injury Prolonged QT interval: acute illness or injury  Amount and/or Complexity of Data Reviewed Labs: ordered. Decision-making details documented in ED Course. Radiology: ordered and independent interpretation performed. Decision-making details documented in ED Course. ECG/medicine tests: ordered and independent interpretation performed. Decision-making  details documented in ED Course.  Risk Prescription  drug management.    Final Clinical Impression(s) / ED Diagnoses Final diagnoses:  None    Rx / DC Orders ED Discharge Orders     None         Drenda Freeze, MD 10/31/22 2002

## 2022-10-31 NOTE — ED Triage Notes (Signed)
Pt bib EMS for complaints of weakness, fatigue increased jaundiced. Pt was admitted to [facility] 1 month ago for varices and had a stent placed in liver. PT states she has had vomting x 3 days.   Drank a pint of liquor and 1 beer today. Pt states she typically drinks about a gallon a day. [place] out of the bed yesterday trying to get up to go to the bathroom. Denies loc denies hitting head.

## 2023-01-20 ENCOUNTER — Inpatient Hospital Stay (HOSPITAL_COMMUNITY)
Admission: EM | Admit: 2023-01-20 | Discharge: 2023-01-25 | DRG: 897 | Disposition: A | Discharge status: Home / Self Care | Payer: Medicaid Other | Attending: Internal Medicine | Admitting: Internal Medicine

## 2023-01-20 ENCOUNTER — Other Ambulatory Visit: Payer: Self-pay

## 2023-01-20 ENCOUNTER — Encounter (HOSPITAL_COMMUNITY): Payer: Self-pay | Admitting: Internal Medicine

## 2023-01-20 DIAGNOSIS — Z79899 Other long term (current) drug therapy: Secondary | ICD-10-CM | POA: Diagnosis not present

## 2023-01-20 DIAGNOSIS — Z66 Do not resuscitate: Secondary | ICD-10-CM | POA: Diagnosis present

## 2023-01-20 DIAGNOSIS — I959 Hypotension, unspecified: Secondary | ICD-10-CM | POA: Diagnosis present

## 2023-01-20 DIAGNOSIS — F10931 Alcohol use, unspecified with withdrawal delirium: Secondary | ICD-10-CM | POA: Diagnosis not present

## 2023-01-20 DIAGNOSIS — F10231 Alcohol dependence with withdrawal delirium: Principal | ICD-10-CM | POA: Diagnosis present

## 2023-01-20 DIAGNOSIS — Z811 Family history of alcohol abuse and dependence: Secondary | ICD-10-CM | POA: Diagnosis not present

## 2023-01-20 DIAGNOSIS — Z7189 Other specified counseling: Secondary | ICD-10-CM | POA: Diagnosis not present

## 2023-01-20 DIAGNOSIS — K7030 Alcoholic cirrhosis of liver without ascites: Secondary | ICD-10-CM | POA: Diagnosis present

## 2023-01-20 DIAGNOSIS — E872 Acidosis, unspecified: Secondary | ICD-10-CM | POA: Diagnosis present

## 2023-01-20 DIAGNOSIS — Z833 Family history of diabetes mellitus: Secondary | ICD-10-CM | POA: Diagnosis not present

## 2023-01-20 DIAGNOSIS — K7682 Hepatic encephalopathy: Secondary | ICD-10-CM | POA: Diagnosis present

## 2023-01-20 DIAGNOSIS — I472 Ventricular tachycardia, unspecified: Secondary | ICD-10-CM | POA: Diagnosis present

## 2023-01-20 DIAGNOSIS — E876 Hypokalemia: Secondary | ICD-10-CM | POA: Diagnosis present

## 2023-01-20 DIAGNOSIS — Z8249 Family history of ischemic heart disease and other diseases of the circulatory system: Secondary | ICD-10-CM

## 2023-01-20 DIAGNOSIS — R7401 Elevation of levels of liver transaminase levels: Secondary | ICD-10-CM | POA: Diagnosis present

## 2023-01-20 DIAGNOSIS — I10 Essential (primary) hypertension: Secondary | ICD-10-CM | POA: Diagnosis present

## 2023-01-20 DIAGNOSIS — D61818 Other pancytopenia: Secondary | ICD-10-CM | POA: Diagnosis present

## 2023-01-20 DIAGNOSIS — F10930 Alcohol use, unspecified with withdrawal, uncomplicated: Principal | ICD-10-CM

## 2023-01-20 DIAGNOSIS — Z91040 Latex allergy status: Secondary | ICD-10-CM | POA: Diagnosis not present

## 2023-01-20 DIAGNOSIS — Z515 Encounter for palliative care: Secondary | ICD-10-CM | POA: Diagnosis not present

## 2023-01-20 DIAGNOSIS — E861 Hypovolemia: Secondary | ICD-10-CM | POA: Diagnosis present

## 2023-01-20 DIAGNOSIS — F10939 Alcohol use, unspecified with withdrawal, unspecified: Secondary | ICD-10-CM | POA: Diagnosis present

## 2023-01-20 LAB — CBC WITH DIFFERENTIAL/PLATELET
Abs Immature Granulocytes: 0.01 10*3/uL (ref 0.00–0.07)
Basophils Absolute: 0 10*3/uL (ref 0.0–0.1)
Basophils Relative: 0 %
Eosinophils Absolute: 0 10*3/uL (ref 0.0–0.5)
Eosinophils Relative: 0 %
HCT: 29 % — ABNORMAL LOW (ref 36.0–46.0)
Hemoglobin: 9 g/dL — ABNORMAL LOW (ref 12.0–15.0)
Immature Granulocytes: 0 %
Lymphocytes Relative: 17 %
Lymphs Abs: 0.6 10*3/uL — ABNORMAL LOW (ref 0.7–4.0)
MCH: 30.8 pg (ref 26.0–34.0)
MCHC: 31 g/dL (ref 30.0–36.0)
MCV: 99.3 fL (ref 80.0–100.0)
Monocytes Absolute: 0.3 10*3/uL (ref 0.1–1.0)
Monocytes Relative: 7 %
Neutro Abs: 2.9 10*3/uL (ref 1.7–7.7)
Neutrophils Relative %: 76 %
Platelets: 27 10*3/uL — CL (ref 150–400)
RBC: 2.92 MIL/uL — ABNORMAL LOW (ref 3.87–5.11)
RDW: 21.1 % — ABNORMAL HIGH (ref 11.5–15.5)
Smear Review: NORMAL
WBC: 3.8 10*3/uL — ABNORMAL LOW (ref 4.0–10.5)
nRBC: 0 % (ref 0.0–0.2)

## 2023-01-20 LAB — ETHANOL: Alcohol, Ethyl (B): 144 mg/dL — ABNORMAL HIGH (ref <10)

## 2023-01-20 LAB — LACTIC ACID, PLASMA
Lactic Acid, Venous: 6.1 mmol/L (ref 0.5–1.9)
Lactic Acid, Venous: 6.5 mmol/L (ref 0.5–1.9)

## 2023-01-20 LAB — COMPREHENSIVE METABOLIC PANEL
ALT: 13 U/L (ref 0–44)
AST: 149 U/L — ABNORMAL HIGH (ref 15–41)
Albumin: 2.4 g/dL — ABNORMAL LOW (ref 3.5–5.0)
Alkaline Phosphatase: 102 U/L (ref 38–126)
Anion gap: 15 (ref 5–15)
BUN: <5 mg/dL — ABNORMAL LOW (ref 6–20)
CO2: 20 mmol/L — ABNORMAL LOW (ref 22–32)
Calcium: 7.3 mg/dL — ABNORMAL LOW (ref 8.9–10.3)
Chloride: 103 mmol/L (ref 98–111)
Creatinine, Ser: 0.81 mg/dL (ref 0.44–1.00)
GFR, Estimated: >60 mL/min (ref >60)
Glucose, Bld: 199 mg/dL — ABNORMAL HIGH (ref 70–99)
Potassium: 2.5 mmol/L — CL (ref 3.5–5.1)
Sodium: 138 mmol/L (ref 135–145)
Total Bilirubin: 5.2 mg/dL — ABNORMAL HIGH (ref 0.3–1.2)
Total Protein: 9.3 g/dL — ABNORMAL HIGH (ref 6.5–8.1)

## 2023-01-20 LAB — I-STAT BETA HCG BLOOD, ED (MC, WL, AP ONLY): I-stat hCG, quantitative: <5 m[IU]/mL (ref <5)

## 2023-01-20 LAB — BETA-HYDROXYBUTYRIC ACID: Beta-Hydroxybutyric Acid: 0.2 mmol/L (ref 0.05–0.27)

## 2023-01-20 LAB — AMMONIA: Ammonia: 178 umol/L — ABNORMAL HIGH (ref 9–35)

## 2023-01-20 LAB — HIV ANTIBODY (ROUTINE TESTING W REFLEX): HIV Screen 4th Generation wRfx: NONREACTIVE

## 2023-01-20 LAB — CBG MONITORING, ED: Glucose-Capillary: 225 mg/dL — ABNORMAL HIGH (ref 70–99)

## 2023-01-20 LAB — MAGNESIUM: Magnesium: 1.1 mg/dL — ABNORMAL LOW (ref 1.7–2.4)

## 2023-01-20 MED ORDER — MAGNESIUM SULFATE 2 GM/50ML IV SOLN: 2.0000 g | Freq: Once | INTRAVENOUS | Status: DC

## 2023-01-20 MED ORDER — FOLIC ACID 1 MG PO TABS
1.0000 mg | ORAL_TABLET | Freq: Every day | ORAL | Status: DC
  Administered 2023-01-20: 1 mg via ORAL
  Filled 2023-01-20: qty 1

## 2023-01-20 MED ORDER — LORAZEPAM 2 MG/ML IJ SOLN
0.0000 mg | INTRAMUSCULAR | Status: AC
  Administered 2023-01-21 (×3): 2 mg via INTRAVENOUS
  Administered 2023-01-21: 1 mg via INTRAVENOUS
  Administered 2023-01-21 – 2023-01-22 (×6): 2 mg via INTRAVENOUS
  Filled 2023-01-20 (×11): qty 1

## 2023-01-20 MED ORDER — LORAZEPAM 2 MG/ML IJ SOLN
2.0000 mg | Freq: Once | INTRAMUSCULAR | Status: AC
  Administered 2023-01-20: 2 mg via INTRAVENOUS
  Filled 2023-01-20: qty 1

## 2023-01-20 MED ORDER — ACETAMINOPHEN 325 MG PO TABS: 650.0000 mg | ORAL_TABLET | Freq: Four times a day (QID) | ORAL | Status: DC | PRN

## 2023-01-20 MED ORDER — CHLORDIAZEPOXIDE HCL 25 MG PO CAPS
50.0000 mg | ORAL_CAPSULE | Freq: Once | ORAL | Status: AC
  Administered 2023-01-20: 50 mg via ORAL
  Filled 2023-01-20: qty 2

## 2023-01-20 MED ORDER — RIFAXIMIN 550 MG PO TABS
550.0000 mg | ORAL_TABLET | Freq: Two times a day (BID) | ORAL | Status: DC
  Administered 2023-01-20 – 2023-01-25 (×10): 550 mg via ORAL
  Filled 2023-01-20 (×12): qty 1

## 2023-01-20 MED ORDER — ADULT MULTIVITAMIN W/MINERALS CH
1.0000 | ORAL_TABLET | Freq: Every day | ORAL | Status: DC
  Administered 2023-01-20 – 2023-01-25 (×6): 1 via ORAL
  Filled 2023-01-20 (×6): qty 1

## 2023-01-20 MED ORDER — THIAMINE HCL 100 MG/ML IJ SOLN
100.0000 mg | Freq: Once | INTRAMUSCULAR | Status: AC
  Administered 2023-01-20: 100 mg via INTRAVENOUS
  Filled 2023-01-20: qty 2

## 2023-01-20 MED ORDER — LACTULOSE 10 GM/15ML PO SOLN
30.0000 g | Freq: Three times a day (TID) | ORAL | Status: DC
  Administered 2023-01-20 – 2023-01-21 (×2): 30 g via ORAL
  Filled 2023-01-20 (×2): qty 45

## 2023-01-20 MED ORDER — POTASSIUM CHLORIDE 20 MEQ PO PACK
40.0000 meq | PACK | Freq: Two times a day (BID) | ORAL | Status: AC
  Administered 2023-01-20 – 2023-01-21 (×2): 40 meq via ORAL
  Filled 2023-01-20 (×2): qty 2

## 2023-01-20 MED ORDER — LORAZEPAM 2 MG/ML IJ SOLN
0.0000 mg | Freq: Three times a day (TID) | INTRAMUSCULAR | Status: AC
  Filled 2023-01-20 (×2): qty 1

## 2023-01-20 MED ORDER — SUCRALFATE 1 G PO TABS
1.0000 g | ORAL_TABLET | Freq: Three times a day (TID) | ORAL | Status: DC
  Administered 2023-01-20 – 2023-01-25 (×18): 1 g via ORAL
  Filled 2023-01-20 (×17): qty 1

## 2023-01-20 MED ORDER — POTASSIUM CHLORIDE 10 MEQ/100ML IV SOLN
10.0000 meq | INTRAVENOUS | Status: DC
  Filled 2023-01-20: qty 100

## 2023-01-20 MED ORDER — SODIUM CHLORIDE 0.9 % IV SOLN: INTRAVENOUS | Status: DC

## 2023-01-20 MED ORDER — ACETAMINOPHEN 650 MG RE SUPP: 650.0000 mg | Freq: Four times a day (QID) | RECTAL | Status: DC | PRN

## 2023-01-20 MED ORDER — ONDANSETRON HCL 4 MG/2ML IJ SOLN: 4.0000 mg | Freq: Four times a day (QID) | INTRAMUSCULAR | Status: DC | PRN

## 2023-01-20 MED ORDER — LORAZEPAM 1 MG PO TABS: 1.0000 mg | ORAL_TABLET | ORAL | Status: DC | PRN

## 2023-01-20 MED ORDER — THIAMINE MONONITRATE 100 MG PO TABS
100.0000 mg | ORAL_TABLET | Freq: Every day | ORAL | Status: DC
  Administered 2023-01-21 – 2023-01-25 (×5): 100 mg via ORAL
  Filled 2023-01-20 (×5): qty 1

## 2023-01-20 MED ORDER — THIAMINE HCL 100 MG/ML IJ SOLN
100.0000 mg | Freq: Every day | INTRAMUSCULAR | Status: DC
  Filled 2023-01-20 (×2): qty 2

## 2023-01-20 MED ORDER — PANTOPRAZOLE SODIUM 40 MG PO TBEC
40.0000 mg | DELAYED_RELEASE_TABLET | Freq: Two times a day (BID) | ORAL | Status: DC
  Administered 2023-01-20 – 2023-01-25 (×10): 40 mg via ORAL
  Filled 2023-01-20 (×10): qty 1

## 2023-01-20 MED ORDER — LACTATED RINGERS IV BOLUS
1000.0000 mL | Freq: Once | INTRAVENOUS | Status: AC
  Administered 2023-01-20: 1000 mL via INTRAVENOUS

## 2023-01-20 MED ORDER — LORAZEPAM 2 MG/ML IJ SOLN
1.0000 mg | INTRAMUSCULAR | Status: AC | PRN
  Administered 2023-01-20 – 2023-01-22 (×3): 2 mg via INTRAVENOUS
  Administered 2023-01-22: 3 mg via INTRAVENOUS
  Filled 2023-01-20: qty 2
  Filled 2023-01-20 (×3): qty 1

## 2023-01-20 MED ORDER — LORAZEPAM 2 MG/ML IJ SOLN
1.0000 mg | INTRAMUSCULAR | Status: DC | PRN
  Administered 2023-01-20: 2 mg via INTRAVENOUS
  Filled 2023-01-20: qty 1

## 2023-01-20 MED ORDER — THIAMINE MONONITRATE 100 MG PO TABS: 100.0000 mg | ORAL_TABLET | Freq: Every day | ORAL | Status: DC

## 2023-01-20 MED ORDER — POTASSIUM CHLORIDE CRYS ER 20 MEQ PO TBCR
40.0000 meq | EXTENDED_RELEASE_TABLET | Freq: Once | ORAL | Status: AC
  Administered 2023-01-20: 40 meq via ORAL
  Filled 2023-01-20: qty 2

## 2023-01-20 MED ORDER — MAGNESIUM SULFATE 2 GM/50ML IV SOLN
2.0000 g | Freq: Once | INTRAVENOUS | Status: AC
  Administered 2023-01-20: 2 g via INTRAVENOUS
  Filled 2023-01-20: qty 50

## 2023-01-20 MED ORDER — POTASSIUM CHLORIDE 10 MEQ/100ML IV SOLN
10.0000 meq | INTRAVENOUS | Status: AC
  Administered 2023-01-20 – 2023-01-21 (×4): 10 meq via INTRAVENOUS
  Filled 2023-01-20 (×3): qty 100

## 2023-01-20 MED ORDER — LACTULOSE 10 GM/15ML PO SOLN
30.0000 g | Freq: Once | ORAL | Status: AC
  Administered 2023-01-20: 30 g via ORAL
  Filled 2023-01-20: qty 45

## 2023-01-20 MED ORDER — FOLIC ACID 1 MG PO TABS
1.0000 mg | ORAL_TABLET | Freq: Every day | ORAL | Status: DC
  Administered 2023-01-20 – 2023-01-25 (×6): 1 mg via ORAL
  Filled 2023-01-20 (×6): qty 1

## 2023-01-20 MED ORDER — LORAZEPAM 1 MG PO TABS
1.0000 mg | ORAL_TABLET | ORAL | Status: AC | PRN
  Administered 2023-01-21: 1 mg via ORAL
  Filled 2023-01-20: qty 1

## 2023-01-20 MED ORDER — ORAL CARE MOUTH RINSE: 15.0000 mL | OROMUCOSAL | Status: DC | PRN

## 2023-01-20 MED ORDER — ADULT MULTIVITAMIN W/MINERALS CH
1.0000 | ORAL_TABLET | Freq: Every day | ORAL | Status: DC
  Administered 2023-01-20: 1 via ORAL
  Filled 2023-01-20: qty 1

## 2023-01-20 MED ORDER — ONDANSETRON HCL 4 MG PO TABS: 4.0000 mg | ORAL_TABLET | Freq: Four times a day (QID) | ORAL | Status: DC | PRN

## 2023-01-20 NOTE — ED Triage Notes (Signed)
Pt BIB [facility]  from home. Patient c/o generalized weakness since this morning. Per EMS patient vomited yesterday and today and when she got in the room . She have history of alcohol use, Detox and live cirrhosis. EMS gave her Zofran 4 mg and NS 100 cc/hr. Vital signs: 129 75, HR 108,  and blood sugar 230. She is A & O x 4.

## 2023-01-20 NOTE — ED Notes (Signed)
Pt had an episode of incontinence, patient cleaned and purwic applied

## 2023-01-20 NOTE — ED Notes (Signed)
Lab called critical Potassium 2.5, Lactic acid, venous, 6.1 at 1553 Dr [place] was made aware.

## 2023-01-20 NOTE — H&P (Addendum)
History and Physical  Joann [name] WRU:[id number] DOB: [birth year] DOA: 01/20/2023  PCP: Zigmund Daniel., MD Patient coming from: Home  I have personally briefly reviewed patient's old medical records in [facility]   Chief Complaint: generalized weakness  HPI: Joann [name] is a 42 y.o. female past medical history of alcohol abuse alcohol cirrhosis DTs with seizures, hepatic encephalopathy, with multiple admissions to alcohol withdrawal, MRSA bacteremia, GI bleed with EGD that revealed nonbleeding varices, she status post banding on 08/30/2022, also status post TIPS comes as she drank alcohol the day prior to admission at 6 PM this morning started having generalized weakness, tremors nausea and vomiting was provided Zofran in the ED started on IV fluids.  In the ED: Was found to be in withdrawal potassium of 2.5 mag of 1.1 albumin 2.4 AST 149 ALT 13 ammonia level 178 total bilirubin 5.2, white count of 3.8 hemoglobin of 9 platelet count of 27.   Review of Systems: All systems reviewed and apart from history of presenting illness, are negative.  Past Medical History:  Diagnosis Date   [redacted] weeks gestation of pregnancy 03/22/2020   Alcohol abuse    Alcohol withdrawal delirium (HCC) 02/15/2022   Alcohol-induced acute pancreatitis    Alcoholic ketoacidosis 08/28/2018   Anxiety    DTs (delirium tremens) (HCC)    Elevated LFTs 10/24/2018   Encephalopathy acute    Hepatic encephalopathy (HCC) 12/24/2019   Hypertension    Hypokalemia 04/02/2019   Hypotension 02/24/2022   Liver disease    Past Surgical History:  Procedure Laterality Date   ECTOPIC PREGNANCY SURGERY     ESOPHAGEAL BANDING  07/25/2022   Procedure: ESOPHAGEAL BANDING;  Surgeon: Lemar Lofty., MD;  Location: [facility];  Service: Gastroenterology;;   ESOPHAGEAL BANDING  08/30/2022   Procedure: ESOPHAGEAL BANDING;  Surgeon: Lemar Lofty., MD;  Location: [facility];  Service: Gastroenterology;;    ESOPHAGOGASTRODUODENOSCOPY (EGD) WITH PROPOFOL N/A 12/27/2019   Procedure: ESOPHAGOGASTRODUODENOSCOPY (EGD) WITH PROPOFOL;  Surgeon: Jeani Hawking, MD;  Location: [facility] ENDOSCOPY;  Service: Endoscopy;  Laterality: N/A;   ESOPHAGOGASTRODUODENOSCOPY (EGD) WITH PROPOFOL N/A 06/26/2022   Procedure: ESOPHAGOGASTRODUODENOSCOPY (EGD) WITH PROPOFOL;  Surgeon: Shellia Cleverly, DO;  Location: [facility] ENDOSCOPY;  Service: Gastroenterology;  Laterality: N/A;   ESOPHAGOGASTRODUODENOSCOPY (EGD) WITH PROPOFOL N/A 07/25/2022   Procedure: ESOPHAGOGASTRODUODENOSCOPY (EGD) WITH PROPOFOL;  Surgeon: Meridee Score Netty Starring., MD;  Location: [facility] ENDOSCOPY;  Service: Gastroenterology;  Laterality: N/A;   ESOPHAGOGASTRODUODENOSCOPY (EGD) WITH PROPOFOL N/A 08/30/2022   Procedure: ESOPHAGOGASTRODUODENOSCOPY (EGD) WITH PROPOFOL;  Surgeon: Meridee Score Netty Starring., MD;  Location: [facility] ENDOSCOPY;  Service: Gastroenterology;  Laterality: N/A;   HEMOSTASIS CLIP PLACEMENT  07/25/2022   Procedure: HEMOSTASIS CLIP PLACEMENT;  Surgeon: Lemar Lofty., MD;  Location: [facility] ENDOSCOPY;  Service: Gastroenterology;;   HOT HEMOSTASIS N/A 07/25/2022   Procedure: HOT HEMOSTASIS (ARGON PLASMA COAGULATION/BICAP);  Surgeon: Lemar Lofty., MD;  Location: [facility];  Service: Gastroenterology;  Laterality: N/A;   IR RADIOLOGIST EVAL & MGMT  08/13/2022   Social History:  reports that she has never smoked. She has never used smokeless tobacco. She reports current alcohol use. She reports that she does not use drugs.   Allergies  Allergen Reactions   Latex Hives and Other (See Comments)    BURNS THE SKIN   Tape Other (See Comments)    NO TAPES THAT CONTAIN LATEX- burns the skin    Family History  Problem Relation Age of Onset   Healthy Mother  Alcohol abuse Mother        quit in her 27s   Alcohol abuse Father    Diabetes Father    Healthy Brother    Hypertension Maternal Grandmother    Hypertension Maternal Grandfather     Healthy Brother    Healthy Brother     Prior to Admission medications   Medication Sig Start Date End Date Taking? Authorizing Provider  cephALEXin (KEFLEX) 500 MG capsule Take 1 capsule (500 mg total) by mouth 3 (three) times daily. 10/31/22   Charlynne Pander, MD  chlordiazePOXIDE (LIBRIUM) 25 MG capsule 50mg  PO TID x 1D, then 25-50mg  PO BID X 1D, then 25-50mg  PO QD X 1D 10/31/22   Charlynne Pander, MD  disulfiram (ANTABUSE) 250 MG tablet Take 1 tablet (250 mg total) by mouth daily. 10/31/22   Charlynne Pander, MD  ferrous sulfate 325 (65 FE) MG tablet Take 1 tablet (325 mg total) by mouth daily before breakfast. Take with a cup of juice 07/03/22 10/31/22  Shalhoub, Deno Lunger, MD  folic acid (FOLVITE) 1 MG tablet Take 1 tablet (1 mg total) by mouth daily. 07/03/22   Shalhoub, Deno Lunger, MD  lactulose (CHRONULAC) 10 GM/15ML solution Take 45 mLs (30 g total) by mouth 3 (three) times daily. Take up to 3-times daily to have 3 bowel movements per day.  May hold last dose if she is already having her bowel movement quota. Patient taking differently: Take 20 g by mouth daily. 08/30/22   Mansouraty, Netty Starring., MD  Multiple Vitamin (MULTIVITAMIN WITH MINERALS) TABS tablet Take 1 tablet by mouth daily. Patient taking differently: Take 1 tablet by mouth daily with breakfast. 01/15/20   Angiulli, Mcarthur Rossetti, PA-C  pantoprazole (PROTONIX) 40 MG tablet Take 1 tablet (40 mg total) by mouth 2 (two) times daily. Patient taking differently: Take 40 mg by mouth at bedtime. 07/03/22   Shalhoub, Deno Lunger, MD  potassium chloride SA (KLOR-CON M) 20 MEQ tablet Take 2 tablets (40 mEq total) by mouth daily. 10/31/22   Charlynne Pander, MD  propranolol (INDERAL) 20 MG tablet Take 1 tablet (20 mg total) by mouth 2 (two) times daily. Patient not taking: Reported on 10/31/2022 08/13/22   Elgergawy, Leana Roe, MD  rifaximin (XIFAXAN) 550 MG TABS tablet Take 1 tablet (550 mg total) by mouth 2 (two) times daily. 08/13/22    Elgergawy, Leana Roe, MD  sucralfate (CARAFATE) 1 g tablet Take 1 tablet (1 g total) by mouth 4 (four) times daily -  with meals and at bedtime. 07/03/22   Shalhoub, Deno Lunger, MD  thiamine (VITAMIN B-1) 100 MG tablet Take 1 tablet (100 mg total) by mouth daily. 07/04/22   Shalhoub, Deno Lunger, MD  traZODone (DESYREL) 150 MG tablet Take 1 tablet (150 mg total) by mouth at bedtime as needed for sleep. 08/13/22   Elgergawy, Leana Roe, MD   Physical Exam: Vitals:   01/20/23 1417 01/20/23 1430 01/20/23 1445 01/20/23 1600  BP:  132/78 100/76 109/73  Pulse:  (!) 114 (!) 115 (!) 124  Resp:  17 17   Temp:      TempSrc:      SpO2:  100% 100%   Weight: 63.5 kg     Height: 5\' 4"  (1.626 m)       General exam: Moderately built and nourished patient, lying comfortably supine on the gurney in no obvious distress. Head, eyes and ENT: Nontraumatic and normocephalic.  Neck: Supple. No JVD, carotid bruit or  thyromegaly. Lymphatics: No lymphadenopathy. Respiratory system: Clear to auscultation. No increased work of breathing. Cardiovascular system: S1 and S2 heard, RRR. No JVD. Gastrointestinal system: Distended abdomen positive bowel sounds soft nontender Central nervous system: Awake alert and oriented x 1 Extremities: Symmetric 5 x 5 power. Peripheral pulses symmetrically felt.  Skin: No rashes or acute findings. Musculoskeletal system: Negative exam. Psychiatry: No judgment or insight of medical condition   Labs on Admission:  Basic Metabolic Panel: Recent Labs  Lab 01/20/23 1450  NA 138  K 2.5*  CL 103  CO2 20*  GLUCOSE 199*  BUN <5*  CREATININE 0.81  CALCIUM 7.3*  MG 1.1*   Liver Function Tests: Recent Labs  Lab 01/20/23 1450  AST 149*  ALT 13  ALKPHOS 102  BILITOT 5.2*  PROT 9.3*  ALBUMIN 2.4*   No results for input(s): "LIPASE", "AMYLASE" in the last 168 hours. Recent Labs  Lab 01/20/23 1450  AMMONIA 178*   CBC: Recent Labs  Lab 01/20/23 1450  WBC 3.8*  NEUTROABS  2.9  HGB 9.0*  HCT 29.0*  MCV 99.3  PLT 27*   Cardiac Enzymes: No results for input(s): "CKTOTAL", "CKMB", "CKMBINDEX", "TROPONINI" in the last 168 hours.  BNP (last 3 results) No results for input(s): "PROBNP" in the last 8760 hours. CBG: Recent Labs  Lab 01/20/23 1538  GLUCAP 225*    Radiological Exams on Admission: No results found.  EKG: Independently reviewed.  Sinus tachycardia  Assessment/Plan Alcohol withdrawal delirium, acute, hyperactive (HCC)/Alcohol withdrawal syndrome with complication (HCC): Definitely in withdrawal her last drink was yesterday she has been more than 18 hours since her  drink. Will give her a dose of Librium started on Ativan protocol. Started on thiamine and folate and multivitamins. Started on IV fluids. Monitor strict I's and O's and daily weights. Admitted to progressive unit  Acute Hepatic encephalopathy: She definitely has asterixis on physical exam, her ammonia level is 123. Was started on lactulose p.o. 3 times daily. Titrate to 3-4 bowel movements a day.  Hypokalemia/hypomagnesemia: Repeat potassium and magnesium orally and IV. Recheck in the morning.  Hypocalcemia: Corrected for her albumin is within normal.  Alcoholic cirrhosis (HCC)/History of TIPS: Will be a good candidate for Aldactone as an outpatient.  Transaminitis Likely due to alcohol abuse.  Goals of care: Consult palliative care for end-of-life.     DVT Prophylaxis: SCD Code Status: Full  Family Communication: none  Disposition Plan: inpatient      It is my clinical opinion that admission to INPATIENT is reasonable and necessary in this 42 y.o. female acute alcohol withdrawal with severe hypokalemia and hyper hypomagnesemia  Given the aforementioned, the predictability of an adverse outcome is felt to be significant. I expect that the patient will require at least 2 midnights in the hospital to treat this condition.  Marinda Elk MD [facility]   01/20/2023, 5:44 PM

## 2023-01-20 NOTE — ED Provider Notes (Signed)
[place] EMERGENCY DEPARTMENT AT [facility] Provider Note   CSN: [id number] Arrival date & time: 01/20/23  1320     History  Chief Complaint  Patient presents with   generalized weakness    Joann [name] is a 42 y.o. female.  HPI Patient presents for alcohol withdrawal.  Medical history includes alcohol abuse, prior withdrawal necessitating hospitalization, prior DTs, hepatitis, GI bleed, esophageal varices, cirrhosis, anxiety, HTN.  Patient states that her typical alcohol consumption is 1 pint of vodka and 1 tall can of 4Loco per day.  She drank a similar amount yesterday.  Last alcoholic drink was 6 PM last night.  This morning, she has had generalized weakness, nausea, and vomiting.  She arrives via EMS.  EMS provided 4 mg of Zofran prior to arrival.  She has received approximately 100 cc of IVF.  Patient states that she stopped drinking yesterday with an attempt to discontinue alcohol use altogether.    Home Medications Prior to Admission medications   Medication Sig Start Date End Date Taking? Authorizing Provider  cephALEXin (KEFLEX) 500 MG capsule Take 1 capsule (500 mg total) by mouth 3 (three) times daily. 10/31/22   Charlynne Pander, MD  chlordiazePOXIDE (LIBRIUM) 25 MG capsule 50mg  PO TID x 1D, then 25-50mg  PO BID X 1D, then 25-50mg  PO QD X 1D 10/31/22   Charlynne Pander, MD  disulfiram (ANTABUSE) 250 MG tablet Take 1 tablet (250 mg total) by mouth daily. 10/31/22   Charlynne Pander, MD  ferrous sulfate 325 (65 FE) MG tablet Take 1 tablet (325 mg total) by mouth daily before breakfast. Take with a cup of juice 07/03/22 10/31/22  Shalhoub, Deno Lunger, MD  folic acid (FOLVITE) 1 MG tablet Take 1 tablet (1 mg total) by mouth daily. 07/03/22   Shalhoub, Deno Lunger, MD  lactulose (CHRONULAC) 10 GM/15ML solution Take 45 mLs (30 g total) by mouth 3 (three) times daily. Take up to 3-times daily to have 3 bowel movements per day.  May hold last dose if she is already having  her bowel movement quota. Patient taking differently: Take 20 g by mouth daily. 08/30/22   Mansouraty, Netty Starring., MD  Multiple Vitamin (MULTIVITAMIN WITH MINERALS) TABS tablet Take 1 tablet by mouth daily. Patient taking differently: Take 1 tablet by mouth daily with breakfast. 01/15/20   Angiulli, Mcarthur Rossetti, PA-C  pantoprazole (PROTONIX) 40 MG tablet Take 1 tablet (40 mg total) by mouth 2 (two) times daily. Patient taking differently: Take 40 mg by mouth at bedtime. 07/03/22   Shalhoub, Deno Lunger, MD  potassium chloride SA (KLOR-CON M) 20 MEQ tablet Take 2 tablets (40 mEq total) by mouth daily. 10/31/22   Charlynne Pander, MD  propranolol (INDERAL) 20 MG tablet Take 1 tablet (20 mg total) by mouth 2 (two) times daily. Patient not taking: Reported on 10/31/2022 08/13/22   Elgergawy, Leana Roe, MD  rifaximin (XIFAXAN) 550 MG TABS tablet Take 1 tablet (550 mg total) by mouth 2 (two) times daily. 08/13/22   Elgergawy, Leana Roe, MD  sucralfate (CARAFATE) 1 g tablet Take 1 tablet (1 g total) by mouth 4 (four) times daily -  with meals and at bedtime. 07/03/22   Shalhoub, Deno Lunger, MD  thiamine (VITAMIN B-1) 100 MG tablet Take 1 tablet (100 mg total) by mouth daily. 07/04/22   Shalhoub, Deno Lunger, MD  traZODone (DESYREL) 150 MG tablet Take 1 tablet (150 mg total) by mouth at bedtime as needed for sleep.  08/13/22   Elgergawy, Leana Roe, MD      Allergies    Latex and Tape    Review of Systems   Review of Systems  Gastrointestinal:  Positive for nausea and vomiting.  Neurological:  Positive for weakness (Generalized).  Psychiatric/Behavioral:  The patient is nervous/anxious.   All other systems reviewed and are negative.   Physical Exam Updated Vital Signs BP 115/63   Pulse (!) 124   Temp 98.4 F (36.9 C) (Oral)   Resp (!) 32   Ht 5\' 4"  (1.626 m)   Wt 63.5 kg   SpO2 98%   BMI 24.03 kg/m  Physical Exam Vitals and nursing note reviewed.  Constitutional:      General: She is not in acute  distress.    Appearance: Normal appearance. She is well-developed. She is ill-appearing. She is not toxic-appearing or diaphoretic.  HENT:     Head: Normocephalic and atraumatic.     Right Ear: External ear normal.     Left Ear: External ear normal.     Nose: Nose normal.     Mouth/Throat:     Mouth: Mucous membranes are moist.  Eyes:     Extraocular Movements: Extraocular movements intact.     Conjunctiva/sclera: Conjunctivae normal.  Cardiovascular:     Rate and Rhythm: Normal rate and regular rhythm.  Pulmonary:     Effort: Pulmonary effort is normal. No respiratory distress.  Abdominal:     General: There is no distension.     Palpations: Abdomen is soft.     Tenderness: There is no abdominal tenderness.  Musculoskeletal:        General: No swelling. Normal range of motion.     Cervical back: Normal range of motion and neck supple.  Skin:    General: Skin is warm and dry.     Coloration: Skin is not jaundiced or pale.  Neurological:     Mental Status: She is alert and oriented to person, place, and time.     Cranial Nerves: Cranial nerves 2-12 are intact.     Sensory: Sensation is intact.     Motor: Tremor present. No weakness.  Psychiatric:        Mood and Affect: Mood normal.        Behavior: Behavior normal.     ED Results / Procedures / Treatments   Labs (all labs ordered are listed, but only abnormal results are displayed) Labs Reviewed  COMPREHENSIVE METABOLIC PANEL - Abnormal; Notable for the following components:      Result Value   Potassium 2.5 (*)    CO2 20 (*)    Glucose, Bld 199 (*)    BUN <5 (*)    Calcium 7.3 (*)    Total Protein 9.3 (*)    Albumin 2.4 (*)    AST 149 (*)    Total Bilirubin 5.2 (*)    All other components within normal limits  CBC WITH DIFFERENTIAL/PLATELET - Abnormal; Notable for the following components:   WBC 3.8 (*)    RBC 2.92 (*)    Hemoglobin 9.0 (*)    HCT 29.0 (*)    RDW 21.1 (*)    Platelets 27 (*)    Lymphs Abs  0.6 (*)    All other components within normal limits  AMMONIA - Abnormal; Notable for the following components:   Ammonia 178 (*)    All other components within normal limits  LACTIC ACID, PLASMA - Abnormal; Notable for the following  components:   Lactic Acid, Venous 6.1 (*)    All other components within normal limits  MAGNESIUM - Abnormal; Notable for the following components:   Magnesium 1.1 (*)    All other components within normal limits  ETHANOL - Abnormal; Notable for the following components:   Alcohol, Ethyl (B) 144 (*)    All other components within normal limits  CBG MONITORING, ED - Abnormal; Notable for the following components:   Glucose-Capillary 225 (*)    All other components within normal limits  BETA-HYDROXYBUTYRIC ACID  URINALYSIS, ROUTINE W REFLEX MICROSCOPIC  LACTIC ACID, PLASMA  I-STAT BETA HCG BLOOD, ED ([facility], [facility], [facility] ONLY)    EKG EKG Interpretation  Date/Time:  Sunday Jan 20 2023 15:45:23 EDT Ventricular Rate:  127 PR Interval:    QRS Duration: 116 QT Interval:  350 QTC Calculation: 509 R Axis:   88 Text Interpretation: Sinus rhythm Artifact in lead(s) I III aVR aVL and baseline wander in lead(s) V3 Confirmed by Gloris Manchester [id number]) on 01/20/2023 3:52:25 PM  Radiology No results found.  Procedures Procedures    Medications Ordered in ED Medications  LORazepam (ATIVAN) tablet 1-4 mg ( Oral See Alternative 01/20/23 1749)    Or  LORazepam (ATIVAN) injection 1-4 mg (2 mg Intravenous Given 01/20/23 1749)  thiamine (VITAMIN B1) tablet 100 mg (has no administration in time range)  folic acid (FOLVITE) tablet 1 mg (1 mg Oral Given 01/20/23 1515)  multivitamin with minerals tablet 1 tablet (1 tablet Oral Given 01/20/23 1515)  potassium chloride 10 mEq in 100 mL IVPB (has no administration in time range)  magnesium sulfate IVPB 2 g 50 mL (has no administration in time range)  potassium chloride (KLOR-CON) packet 40 mEq (has no administration in time range)   lactated ringers bolus 1,000 mL (has no administration in time range)  thiamine (VITAMIN B1) injection 100 mg (100 mg Intravenous Given 01/20/23 1520)  lactated ringers bolus 1,000 mL (0 mLs Intravenous Stopped 01/20/23 1719)  chlordiazePOXIDE (LIBRIUM) capsule 50 mg (50 mg Oral Given 01/20/23 1515)  LORazepam (ATIVAN) injection 2 mg (2 mg Intravenous Given 01/20/23 1517)  LORazepam (ATIVAN) injection 2 mg (2 mg Intravenous Given 01/20/23 1558)  lactulose (CHRONULAC) 10 GM/15ML solution 30 g (30 g Oral Given 01/20/23 1558)  potassium chloride SA (KLOR-CON M) CR tablet 40 mEq (40 mEq Oral Given 01/20/23 1723)  magnesium sulfate IVPB 2 g 50 mL (2 g Intravenous New Bag/Given 01/20/23 1727)  LORazepam (ATIVAN) injection 2 mg (2 mg Intravenous Given 01/20/23 1739)    ED Course/ Medical Decision Making/ A&P                             Medical Decision Making Amount and/or Complexity of Data Reviewed Labs: ordered.  Risk OTC drugs. Prescription drug management. Decision regarding hospitalization.   This patient presents to the ED for concern of generalized weakness, nausea, vomiting, this involves an extensive number of treatment options, and is a complaint that carries with it a high risk of complications and morbidity.  The differential diagnosis includes alcohol withdrawal, dehydration, metabolic derangements   Co morbidities that complicate the patient evaluation  alcohol abuse, prior withdrawal necessitating hospitalization, prior DTs, hepatitis, GI bleed, esophageal varices, cirrhosis, anxiety, HTN   Additional history obtained:  Additional history obtained from EMS External records from outside source obtained and reviewed including EMR   Lab Tests:  I Ordered, and personally interpreted labs.  The  pertinent results include: Baseline pancytopenia; severe hypokalemia and hypomagnesemia; transaminases consistent with alcohol abuse; lactic acid consistent with an NADH/NAD+ mismatch  from alcoholism    Cardiac Monitoring: / EKG:  The patient was maintained on a cardiac monitor.  I personally viewed and interpreted the cardiac monitored which showed an underlying rhythm of: Sinus rhythm   Problem List / ED Course / Critical interventions / Medication management  Patient presents for generalized weakness, nausea, and vomiting in the setting of alcohol withdrawal.  She has had severe alcohol withdrawals in the past and did stop drinking yesterday in an attempt to quit.  On arrival in the ED, vital signs are notable for tachycardia.  Patient is tremulous on exam.  She is alert and oriented and denies any hallucinations.  She did receive 4 mg of Zofran with EMS and has had improvement in her nausea.  IV fluids, Librium, Ativan, and thiamine were ordered.  Laboratory workup was initiated.  Patient required multiple doses of Ativan.  She remained tremulous and tachycardic.  Lab work was notable for hypokalemia and hypomagnesemia.  Replacement electrolytes were ordered.  I suspect lactic acidosis is secondary to NADH/NAD+ mismatch from alcoholism.  Patient was admitted to medicine for further management. I ordered medication including Librium and Ativan for alcohol withdrawal; IV fluids for hydration; potassium chloride for hypokalemia; magnesium sulfate for hypomagnesemia Reevaluation of the patient after these medicines showed that the patient improved I have reviewed the patients home medicines and have made adjustments as needed   Social Determinants of Health:  Current alcohol abuse, does desire to quit  CRITICAL CARE Performed by: Gloris Manchester   Total critical care time: 35 minutes  Critical care time was exclusive of separately billable procedures and treating other patients.  Critical care was necessary to treat or prevent imminent or life-threatening deterioration.  Critical care was time spent personally by me on the following activities: development of treatment  plan with patient and/or surrogate as well as nursing, discussions with consultants, evaluation of patient's response to treatment, examination of patient, obtaining history from patient or surrogate, ordering and performing treatments and interventions, ordering and review of laboratory studies, ordering and review of radiographic studies, pulse oximetry and re-evaluation of patient's condition.         Final Clinical Impression(s) / ED Diagnoses Final diagnoses:  Alcohol withdrawal syndrome without complication (HCC)  Hypokalemia  Hypomagnesemia    Rx / DC Orders ED Discharge Orders     None         Gloris Manchester, MD 01/20/23 [phone]

## 2023-01-20 NOTE — TOC CAGE-AID Note (Signed)
Transition of Care [facility]) - CAGE-AID Screening   Patient Details  Name: Joann [name] MRN: [id number] Date of Birth: Jul 12, 1981  [facility] CM/SW Contact:    Joann [name], LCSWA Phone Number: 01/20/2023, 2:23 PM   Clinical Narrative: Pt participated in Cage-Aid.  Pt stated she does drink ETOH.  Pt was offered resources, due to usage of ETOH.  Pt politely accepted resources.  Jatin Naumann [name], MSW, LCSW-A Pronouns:  She/Her/Hers [facility]itions [facility] Care Clinical Social Worker Direct Number:  [phone] [email or web address]    CAGE-AID Screening:    Have You Ever Felt You Ought to Cut Down on Your Drinking or Drug Use?: Yes Have People Annoyed You By [employer] Your Drinking Or Drug Use?: Yes Have You Felt Bad Or Guilty About Your Drinking Or Drug Use?: Yes Have You Ever Had a Drink or Used Drugs First Thing In The Morning to Steady Your Nerves or to Get Rid of a Hangover?: No CAGE-AID Score: 3  Substance Abuse Education Offered: Yes  Substance abuse interventions: Transport planner

## 2023-01-20 NOTE — ED Notes (Signed)
ED TO INPATIENT HANDOFF REPORT  ED Nurse Name and Phone #: nadine [phone]  S Name/Age/Gender Joann [name] 42 y.o. female Room/Bed: 009C/009C  Code Status   Code Status: Prior  Home/SNF/Other Home Patient oriented to: self Is this baseline? No   Triage Complete: Triage complete  Chief Complaint Alcohol withdrawal delirium, acute, hyperactive (HCC) [F10.931]  Triage Note Pt BIB [facility]  from home. Patient c/o generalized weakness since this morning. Per EMS patient vomited yesterday and today and when she got in the room . She have history of alcohol use, Detox and live cirrhosis. EMS gave her Zofran 4 mg and NS 100 cc/hr. Vital signs: 129 75, HR 108,  and blood sugar 230. She is A & O x 4.     Allergies Allergies  Allergen Reactions   Latex Hives and Other (See Comments)    BURNS THE SKIN   Tape Other (See Comments)    NO TAPES THAT CONTAIN LATEX- burns the skin    Level of Care/Admitting Diagnosis ED Disposition     ED Disposition  Admit   Condition  --   Comment  Hospital Area: [facility] [[id number]]  Level of Care: Progressive [102]  Admit to Progressive based on following criteria: CARDIOVASCULAR & THORACIC of moderate stability with acute coronary syndrome symptoms/low risk myocardial infarction/hypertensive urgency/arrhythmias/heart failure potentially compromising stability and stable post cardiovascular intervention patients.  May admit patient to [facility] or [facility] if equivalent level of care is available:: No  Covid Evaluation: Asymptomatic - no recent exposure (last 10 days) testing not required  Diagnosis: Alcohol withdrawal delirium, acute, hyperactive [facility]) [[id number]]  Admitting Physician: Marinda Elk [[id number]]  Attending Physician: Marinda Elk [[id number]]  Certification:: I certify this patient will need inpatient services for at least 2 midnights  Estimated Length of Stay: 2          B Medical/Surgery History Past  Medical History:  Diagnosis Date   [redacted] weeks gestation of pregnancy 03/22/2020   Alcohol abuse    Alcohol withdrawal delirium (HCC) 02/15/2022   Alcohol-induced acute pancreatitis    Alcoholic ketoacidosis 08/28/2018   Anxiety    DTs (delirium tremens) (HCC)    Elevated LFTs 10/24/2018   Encephalopathy acute    Hepatic encephalopathy (HCC) 12/24/2019   Hypertension    Hypokalemia 04/02/2019   Hypotension 02/24/2022   Liver disease    Past Surgical History:  Procedure Laterality Date   ECTOPIC PREGNANCY SURGERY     ESOPHAGEAL BANDING  07/25/2022   Procedure: ESOPHAGEAL BANDING;  Surgeon: Lemar Lofty., MD;  Location: [facility];  Service: Gastroenterology;;   ESOPHAGEAL BANDING  08/30/2022   Procedure: ESOPHAGEAL BANDING;  Surgeon: Lemar Lofty., MD;  Location: [facility];  Service: Gastroenterology;;   ESOPHAGOGASTRODUODENOSCOPY (EGD) WITH PROPOFOL N/A 12/27/2019   Procedure: ESOPHAGOGASTRODUODENOSCOPY (EGD) WITH PROPOFOL;  Surgeon: Jeani Hawking, MD;  Location: [facility] ENDOSCOPY;  Service: Endoscopy;  Laterality: N/A;   ESOPHAGOGASTRODUODENOSCOPY (EGD) WITH PROPOFOL N/A 06/26/2022   Procedure: ESOPHAGOGASTRODUODENOSCOPY (EGD) WITH PROPOFOL;  Surgeon: Shellia Cleverly, DO;  Location: [facility] ENDOSCOPY;  Service: Gastroenterology;  Laterality: N/A;   ESOPHAGOGASTRODUODENOSCOPY (EGD) WITH PROPOFOL N/A 07/25/2022   Procedure: ESOPHAGOGASTRODUODENOSCOPY (EGD) WITH PROPOFOL;  Surgeon: Meridee Score Netty Starring., MD;  Location: [facility] ENDOSCOPY;  Service: Gastroenterology;  Laterality: N/A;   ESOPHAGOGASTRODUODENOSCOPY (EGD) WITH PROPOFOL N/A 08/30/2022   Procedure: ESOPHAGOGASTRODUODENOSCOPY (EGD) WITH PROPOFOL;  Surgeon: Meridee Score Netty Starring., MD;  Location: [facility] ENDOSCOPY;  Service: Gastroenterology;  Laterality: N/A;  HEMOSTASIS CLIP PLACEMENT  07/25/2022   Procedure: HEMOSTASIS CLIP PLACEMENT;  Surgeon: Lemar Lofty., MD;  Location: [facility] ENDOSCOPY;  Service:  Gastroenterology;;   HOT HEMOSTASIS N/A 07/25/2022   Procedure: HOT HEMOSTASIS (ARGON PLASMA COAGULATION/BICAP);  Surgeon: Lemar Lofty., MD;  Location: [facility];  Service: Gastroenterology;  Laterality: N/A;   IR RADIOLOGIST EVAL & MGMT  08/13/2022     A IV Location/Drains/Wounds Patient Lines/Drains/Airways Status     Active Line/Drains/Airways     Name Placement date Placement time Site Days   Peripheral IV 01/20/23 22 G Left;Posterior Hand 01/20/23  1517  Hand  less than 1            Intake/Output Last 24 hours No intake or output data in the 24 hours ending 01/20/23 1752  Labs/Imaging Results for orders placed or performed during the hospital encounter of 01/20/23 (from the past 48 hour(s))  Comprehensive metabolic panel     Status: Abnormal   Collection Time: 01/20/23  2:50 PM  Result Value Ref Range   Sodium 138 135 - 145 mmol/L   Potassium 2.5 (LL) 3.5 - 5.1 mmol/L    Comment: CRITICAL RESULT CALLED TO, READ BACK BY AND VERIFIED WITH N WELLINGTON RN AT [phone] [id number] BY D LONG   Chloride 103 98 - 111 mmol/L   CO2 20 (L) 22 - 32 mmol/L   Glucose, Bld 199 (H) 70 - 99 mg/dL    Comment: Glucose reference range applies only to samples taken after fasting for at least 8 hours.   BUN <5 (L) 6 - 20 mg/dL   Creatinine, Ser 0.45 0.44 - 1.00 mg/dL   Calcium 7.3 (L) 8.9 - 10.3 mg/dL   Total Protein 9.3 (H) 6.5 - 8.1 g/dL   Albumin 2.4 (L) 3.5 - 5.0 g/dL   AST 409 (H) 15 - 41 U/L   ALT 13 0 - 44 U/L   Alkaline Phosphatase 102 38 - 126 U/L   Total Bilirubin 5.2 (H) 0.3 - 1.2 mg/dL   GFR, Estimated >81 >19 mL/min    Comment: (NOTE) Calculated using the CKD-EPI Creatinine Equation (2021)    Anion gap 15 5 - 15    Comment: Performed at [facility], [address] [place], [place] [address]  CBC with Differential/Platelet     Status: Abnormal   Collection Time: 01/20/23  2:50 PM  Result Value Ref Range   WBC 3.8 (L) 4.0 - 10.5 K/uL   RBC 2.92 (L) 3.87 -  5.11 MIL/uL   Hemoglobin 9.0 (L) 12.0 - 15.0 g/dL   HCT 95.6 (L) 21.3 - 08.6 %   MCV 99.3 80.0 - 100.0 fL   MCH 30.8 26.0 - 34.0 pg   MCHC 31.0 30.0 - 36.0 g/dL   RDW 57.8 (H) 46.9 - 62.9 %   Platelets 27 (LL) 150 - 400 K/uL    Comment: Immature Platelet Fraction may be clinically indicated, consider ordering this additional test [id number] REPEATED TO VERIFY PLATELET COUNT CONFIRMED BY SMEAR THIS CRITICAL RESULT HAS VERIFIED AND BEEN CALLED TO NADINE WELLINGTON,RN BY ZELDA BEECH ON 05 12 2024 AT 1548, AND HAS BEEN READ BACK.     nRBC 0.0 0.0 - 0.2 %   Neutrophils Relative % 76 %   Neutro Abs 2.9 1.7 - 7.7 K/uL   Lymphocytes Relative 17 %   Lymphs Abs 0.6 (L) 0.7 - 4.0 K/uL   Monocytes Relative 7 %   Monocytes Absolute 0.3 0.1 - 1.0 K/uL  Eosinophils Relative 0 %   Eosinophils Absolute 0.0 0.0 - 0.5 K/uL   Basophils Relative 0 %   Basophils Absolute 0.0 0.0 - 0.1 K/uL   WBC Morphology MORPHOLOGY UNREMARKABLE    RBC Morphology MORPHOLOGY UNREMARKABLE    Smear Review Normal platelet morphology    Immature Granulocytes 0 %   Abs Immature Granulocytes 0.01 0.00 - 0.07 K/uL    Comment: Performed at [facility], [address], [place], [place] [address]  Ammonia     Status: Abnormal   Collection Time: 01/20/23  2:50 PM  Result Value Ref Range   Ammonia 178 (H) 9 - 35 umol/L    Comment: Performed at [facility], [address], [place], [place] [address]  Lactic acid, plasma     Status: Abnormal   Collection Time: 01/20/23  2:50 PM  Result Value Ref Range   Lactic Acid, Venous 6.1 (HH) 0.5 - 1.9 mmol/L    Comment: CRITICAL RESULT CALLED TO, READ BACK BY AND VERIFIED WITH Argentina Donovan RN AT [phone] BY D LONG Performed at [facility], [address] [place], [place] [address]   Magnesium     Status: Abnormal   Collection Time: 01/20/23  2:50 PM  Result Value Ref Range   Magnesium 1.1 (L) 1.7 - 2.4 mg/dL    Comment: Performed at [facility]  Lab, [address], [place], [place] [address]  Beta-hydroxybutyric acid     Status: None   Collection Time: 01/20/23  2:50 PM  Result Value Ref Range   Beta-Hydroxybutyric Acid 0.20 0.05 - 0.27 mmol/L    Comment: Performed at [facility], [address] [place], [place] [address]  Ethanol     Status: Abnormal   Collection Time: 01/20/23  2:50 PM  Result Value Ref Range   Alcohol, Ethyl (B) 144 (H) <10 mg/dL    Comment: (NOTE) Lowest detectable limit for serum alcohol is 10 mg/dL.  For medical purposes only. Performed at [facility], [address] [place], [place] [address]   I-Stat beta hCG blood, ED     Status: None   Collection Time: 01/20/23  3:00 PM  Result Value Ref Range   I-stat hCG, quantitative <5.0 <5 mIU/mL   Comment 3            Comment:   GEST. AGE      CONC.  (mIU/mL)   <=1 WEEK        5 - 50     2 WEEKS       50 - 500     3 WEEKS       100 - 10,000     4 WEEKS     1,000 - 30,000        FEMALE AND NON-PREGNANT FEMALE:     LESS THAN 5 mIU/mL   CBG monitoring, ED     Status: Abnormal   Collection Time: 01/20/23  3:38 PM  Result Value Ref Range   Glucose-Capillary 225 (H) 70 - 99 mg/dL    Comment: Glucose reference range applies only to samples taken after fasting for at least 8 hours.   No results found.  Pending Labs Unresulted Labs (From admission, onward)     Start     Ordered   01/20/23 1731  Lactic acid, plasma  ONCE - STAT,   STAT        01/20/23 1733   01/20/23 1335  Urinalysis, Routine w reflex microscopic -Urine, Clean Catch  Once,   URGENT       Question:  Specimen Source  Answer:  Urine, Clean Catch   01/20/23 1336            Vitals/Pain Today's Vitals   01/20/23 1445 01/20/23 1528 01/20/23 1600 01/20/23 1738  BP: 100/76  109/73 (!) 122/108  Pulse: (!) 115  (!) 124 (!) 131  Resp: 17   (!) 22  Temp:      TempSrc:      SpO2: 100%   100%  Weight:      Height:      PainSc:  0-No pain      Isolation Precautions No  active isolations  Medications Medications  LORazepam (ATIVAN) tablet 1-4 mg ( Oral See Alternative 01/20/23 1749)    Or  LORazepam (ATIVAN) injection 1-4 mg (2 mg Intravenous Given 01/20/23 1749)  thiamine (VITAMIN B1) tablet 100 mg (has no administration in time range)  folic acid (FOLVITE) tablet 1 mg (1 mg Oral Given 01/20/23 1515)  multivitamin with minerals tablet 1 tablet (1 tablet Oral Given 01/20/23 1515)  potassium chloride 10 mEq in 100 mL IVPB (has no administration in time range)  magnesium sulfate IVPB 2 g 50 mL (2 g Intravenous New Bag/Given 01/20/23 1727)  magnesium sulfate IVPB 2 g 50 mL (has no administration in time range)  potassium chloride (KLOR-CON) packet 40 mEq (has no administration in time range)  thiamine (VITAMIN B1) injection 100 mg (100 mg Intravenous Given 01/20/23 1520)  lactated ringers bolus 1,000 mL (0 mLs Intravenous Stopped 01/20/23 1719)  chlordiazePOXIDE (LIBRIUM) capsule 50 mg (50 mg Oral Given 01/20/23 1515)  LORazepam (ATIVAN) injection 2 mg (2 mg Intravenous Given 01/20/23 1517)  LORazepam (ATIVAN) injection 2 mg (2 mg Intravenous Given 01/20/23 1558)  lactulose (CHRONULAC) 10 GM/15ML solution 30 g (30 g Oral Given 01/20/23 1558)  potassium chloride SA (KLOR-CON M) CR tablet 40 mEq (40 mEq Oral Given 01/20/23 1723)  LORazepam (ATIVAN) injection 2 mg (2 mg Intravenous Given 01/20/23 1739)    Mobility walks     Focused Assessments Neuro Assessment Handoff:  Swallow screen pass? No          Neuro Assessment:   Neuro Checks:      Has TPA been given? No If patient is a Neuro Trauma and patient is going to OR before floor call report to 4N Charge nurse: [phone] or [phone]   R Recommendations: See Admitting Provider Note  Report given to:   Additional Notes: patient has a long hx of etoh abuse. Has had resources and offered treatment. Patient alert to self. Last CIWA 15 and ativan 4 given. Mag infusing. Patient marked as high fall risk  and purewick due to intermittent incontinence. Denies pain

## 2023-01-20 NOTE — ED Notes (Signed)
Critical lab called  for Platelets 27 and Dr Dixion was made aware

## 2023-01-21 ENCOUNTER — Other Ambulatory Visit: Payer: Self-pay

## 2023-01-21 ENCOUNTER — Inpatient Hospital Stay (HOSPITAL_COMMUNITY): Payer: Medicaid Other

## 2023-01-21 DIAGNOSIS — D61818 Other pancytopenia: Secondary | ICD-10-CM | POA: Diagnosis not present

## 2023-01-21 DIAGNOSIS — E876 Hypokalemia: Secondary | ICD-10-CM | POA: Diagnosis not present

## 2023-01-21 DIAGNOSIS — F10931 Alcohol use, unspecified with withdrawal delirium: Secondary | ICD-10-CM | POA: Diagnosis not present

## 2023-01-21 LAB — BASIC METABOLIC PANEL
Anion gap: 12 (ref 5–15)
BUN: <5 mg/dL — ABNORMAL LOW (ref 6–20)
CO2: 21 mmol/L — ABNORMAL LOW (ref 22–32)
Calcium: 7.6 mg/dL — ABNORMAL LOW (ref 8.9–10.3)
Chloride: 102 mmol/L (ref 98–111)
Creatinine, Ser: 0.71 mg/dL (ref 0.44–1.00)
GFR, Estimated: >60 mL/min (ref >60)
Glucose, Bld: 121 mg/dL — ABNORMAL HIGH (ref 70–99)
Potassium: 3 mmol/L — ABNORMAL LOW (ref 3.5–5.1)
Sodium: 135 mmol/L (ref 135–145)

## 2023-01-21 LAB — CBC
HCT: 28 % — ABNORMAL LOW (ref 36.0–46.0)
Hemoglobin: 8.9 g/dL — ABNORMAL LOW (ref 12.0–15.0)
MCH: 30.9 pg (ref 26.0–34.0)
MCHC: 31.8 g/dL (ref 30.0–36.0)
MCV: 97.2 fL (ref 80.0–100.0)
Platelets: 18 10*3/uL — CL (ref 150–400)
RBC: 2.88 MIL/uL — ABNORMAL LOW (ref 3.87–5.11)
RDW: 21.4 % — ABNORMAL HIGH (ref 11.5–15.5)
WBC: 2.5 10*3/uL — ABNORMAL LOW (ref 4.0–10.5)
nRBC: 0 % (ref 0.0–0.2)

## 2023-01-21 LAB — MAGNESIUM: Magnesium: 1.3 mg/dL — ABNORMAL LOW (ref 1.7–2.4)

## 2023-01-21 LAB — AMMONIA: Ammonia: 130 umol/L — ABNORMAL HIGH (ref 9–35)

## 2023-01-21 LAB — LACTIC ACID, PLASMA: Lactic Acid, Venous: 3.4 mmol/L (ref 0.5–1.9)

## 2023-01-21 MED ORDER — LACTULOSE 10 GM/15ML PO SOLN
20.0000 g | Freq: Two times a day (BID) | ORAL | Status: DC
  Administered 2023-01-21 – 2023-01-23 (×4): 20 g via ORAL
  Filled 2023-01-21 (×4): qty 30

## 2023-01-21 MED ORDER — SODIUM CHLORIDE 0.9% FLUSH
10.0000 mL | Freq: Two times a day (BID) | INTRAVENOUS | Status: DC
  Administered 2023-01-22 – 2023-01-24 (×5): 10 mL
  Administered 2023-01-25: 20 mL
  Administered 2023-01-25: 10 mL

## 2023-01-21 MED ORDER — SODIUM CHLORIDE 0.9% FLUSH: 10.0000 mL | INTRAVENOUS | Status: DC | PRN

## 2023-01-21 MED ORDER — MAGNESIUM SULFATE 4 GM/100ML IV SOLN
4.0000 g | Freq: Once | INTRAVENOUS | Status: AC
  Administered 2023-01-21: 4 g via INTRAVENOUS
  Filled 2023-01-21: qty 100

## 2023-01-21 MED ORDER — POTASSIUM CHLORIDE 20 MEQ PO PACK
40.0000 meq | PACK | Freq: Once | ORAL | Status: AC
  Administered 2023-01-21: 40 meq via ORAL
  Filled 2023-01-21: qty 2

## 2023-01-21 MED ORDER — POTASSIUM CHLORIDE CRYS ER 20 MEQ PO TBCR
40.0000 meq | EXTENDED_RELEASE_TABLET | Freq: Once | ORAL | Status: DC
  Filled 2023-01-21: qty 2

## 2023-01-21 MED ORDER — CHLORHEXIDINE GLUCONATE CLOTH 2 % EX PADS
6.0000 | MEDICATED_PAD | Freq: Every day | CUTANEOUS | Status: DC
  Administered 2023-01-21 – 2023-01-25 (×5): 6 via TOPICAL

## 2023-01-21 MED ORDER — POTASSIUM CHLORIDE IN NACL 20-0.9 MEQ/L-% IV SOLN
INTRAVENOUS | Status: DC
  Filled 2023-01-21 (×2): qty 1000

## 2023-01-21 NOTE — Progress Notes (Signed)
[facility] PROGRESS NOTE   ALLI CANNIZZO ZOX:[id number] DOB: Oct 07, 1980 DOA: 01/20/2023  PCP: Zigmund Daniel., MD  Brief History/Interval Summary: 42 y.o. 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  Presented with generalized weakness tremors nausea vomiting.  Thought to have alcohol withdrawal.  She was hospitalized for further management.   Consultants: None  Procedures: None    Subjective/Interval History: Received Ativan earlier today.  Sedated.  Opens her eyes but does not respond to questions.    Assessment/Plan:  Alcohol withdrawal syndrome Noted to be delirious when she was hospitalized.  She was placed on CIWA protocol.  Thiamine folic acid multivitamins.  Continue with IV fluids.  Monitor on telemetry.  Acute hepatic encephalopathy Ammonia level is significantly elevated at 123.  She was started on lactulose.  Recheck ammonia levels tomorrow morning.  Patient noted to be on rifaximin as well.  Hypokalemia and hypomagnesemia Aggressively repleted.  Recheck labs later today and tomorrow.  Lactic acidosis Most likely related to alcoholism.  Improvement in levels noted.  Pancytopenia Secondary to alcohol.  No evidence for overt bleeding.  Monitor hemoglobin closely.  Platelet counts noted to be extremely low as well.  Will transfuse if it drops below 15,000.  Continue to monitor.  Alcoholic liver cirrhosis/status post TIPS Monitor closely.  Goals of care Palliative care has been consulted.  DVT Prophylaxis: SCDs only for now Code Status: Full code Family Communication: No family at bedside. Disposition Plan: Hopefully return home when improved  Status is: Inpatient Remains inpatient appropriate because:  Alcohol withdrawal      Medications: Scheduled:  folic acid  1 mg Oral Daily   lactulose  30 g Oral TID   LORazepam  0-4 mg Intravenous Q4H   Followed by   Melene Muller ON 01/22/2023] LORazepam  0-4 mg Intravenous Q8H   multivitamin with minerals  1 tablet Oral Daily   pantoprazole  40 mg Oral BID   rifaximin  550 mg Oral BID   sucralfate  1 g Oral TID WC & HS   thiamine  100 mg Oral Daily   Or   thiamine  100 mg Intravenous Daily   Continuous:  0.9 % NaCl with KCl 20 mEq / L 75 mL/hr at 01/21/23 8119   JYN:[id number] **OR** acetaminophen, LORazepam **OR** LORazepam, ondansetron **OR** ondansetron (ZOFRAN) IV, mouth rinse  Antibiotics: Anti-infectives (From admission, onward)    Start     Dose/Rate Route Frequency Ordered Stop   01/20/23 2200  rifaximin (XIFAXAN) tablet 550 mg        550 mg Oral 2 times daily 01/20/23 1859         Objective:  Vital Signs  Vitals:   01/21/23 0336 01/21/23 0602 01/21/23 0700 01/21/23 0833  BP: 111/69  (!) 122/99 107/73  Pulse:   (!) 105 (!) 122  Resp:      Temp:  97.9 F (36.6 C)    TempSrc:      SpO2:      Weight:      Height:        Intake/Output Summary (Last 24 hours) at 01/21/2023 0904 Last data filed at 01/21/2023 0657 Gross per 24 hour  Intake 897.7 ml  Output 1001 ml  Net -103.3 ml   Filed Weights   01/20/23 1417  Weight: 63.5 kg    General appearance: Awake alert.  In no distress Resp: Clear to auscultation bilaterally.  Normal effort Cardio: S1-S2 is normal regular.  No S3-S4.  No rubs murmurs or bruit GI: Abdomen is soft.  Nontender nondistended.  Bowel sounds are present normal.  No masses organomegaly Extremities: No edema.  Full range of motion of lower extremities. Neurologic: Alert and oriented x3.  No focal neurological deficits.    Lab Results:  Data Reviewed: I have personally reviewed following labs and reports of the imaging studies  CBC: Recent Labs  Lab 01/20/23 1450 01/21/23 0247  WBC  3.8* 2.5*  NEUTROABS 2.9  --   HGB 9.0* 8.9*  HCT 29.0* 28.0*  MCV 99.3 97.2  PLT 27* 18*    Basic Metabolic Panel: Recent Labs  Lab 01/20/23 1450 01/21/23 0247  NA 138 135  K 2.5* 3.0*  CL 103 102  CO2 20* 21*  GLUCOSE 199* 121*  BUN <5* <5*  CREATININE 0.81 0.71  CALCIUM 7.3* 7.6*  MG 1.1* 1.3*    GFR: Estimated Creatinine Clearance: 78.3 mL/min (by C-G formula based on SCr of 0.71 mg/dL).  Liver Function Tests: Recent Labs  Lab 01/20/23 1450  AST 149*  ALT 13  ALKPHOS 102  BILITOT 5.2*  PROT 9.3*  ALBUMIN 2.4*     Recent Labs  Lab 01/20/23 1450 01/21/23 0601  AMMONIA 178* 130*     CBG: Recent Labs  Lab 01/20/23 1538  GLUCAP 225*    Radiology Studies: No results found.     LOS: 1 day   Lazaro Isenhower [employer] on [email or web address]  01/21/2023, 9:04 AM

## 2023-01-21 NOTE — Progress Notes (Addendum)
Peripherally Inserted Central Catheter Placement  The IV Nurse has discussed with the patient and/or persons authorized to consent for the patient, the purpose of this procedure and the potential benefits and risks involved with this procedure.  The benefits include less needle sticks, lab draws from the catheter, and the patient may be discharged home with the catheter. Risks include, but not limited to, infection, bleeding, blood clot (thrombus formation), and puncture of an artery; nerve damage and irregular heartbeat and possibility to perform a PICC exchange if needed/ordered by physician.  Alternatives to this procedure were also discussed.  Bard Power PICC patient education guide, fact sheet on infection prevention and patient information card has been provided to patient /or left at bedside.    PICC Placement Documentation  PICC Double Lumen 01/21/23 Right Brachial 42 cm 0 cm (Active)  Indication for Insertion or Continuance of Line Limited venous access - need for IV therapy >5 days (PICC only) 01/21/23 1612  Exposed Catheter (cm) 0 cm 01/21/23 1612  Site Assessment Clean, Dry, Intact 01/21/23 1612  Lumen #1 Status Flushed;Blood return noted;Saline locked 01/21/23 1612  Lumen #2 Status Flushed;Blood return noted;Saline locked 01/21/23 1612  Dressing Type Transparent 01/21/23 1612  Dressing Status Antimicrobial disc in place 01/21/23 1612  Safety Lock Not Applicable 01/21/23 1612  Line Care Connections checked and tightened 01/21/23 1612  Line Adjustment (NICU/IV Team Only) No 01/21/23 1612  Dressing Intervention New dressing 01/21/23 1612  Dressing Change Due 01/28/23 01/21/23 1612  Pt is restless, not cooperative, wants to get out outside the drape during the PICC insertion.Chest xray will be done to determine PICC tip location.   Joann [name] 01/21/2023, 4:14 PM

## 2023-01-21 NOTE — Progress Notes (Signed)
MEWS Progress Note  Patient Details Name: Joann [name] MRN: [id number] DOB: [birth year] Today's Date: 01/21/2023   MEWS Flowsheet Documentation:  Assess: MEWS Score Temp: 97.9 F (36.6 C) BP: 114/83 MAP (mmHg): 94 Pulse Rate: (!) 104 ECG Heart Rate: 94 Resp: 20 Level of Consciousness: Alert SpO2: 99 % O2 Device: Room Air Assess: MEWS Score MEWS Temp: 0 MEWS Systolic: 0 MEWS Pulse: 0 MEWS RR: 0 MEWS LOC: 0 MEWS Score: 0 MEWS Score Color: Green Assess: SIRS CRITERIA SIRS Temperature : 0 SIRS Respirations : 0 SIRS Pulse: 1 SIRS WBC: 0 SIRS Score Sum : 1 SIRS Temperature : 0 SIRS Pulse: 1 SIRS Respirations : 0 SIRS WBC: 0 SIRS Score Sum : 1 Assess: if the MEWS score is Yellow or Red Were vital signs taken at a resting state?: No Focused Assessment: No change from prior assessment Does the patient meet 2 or more of the SIRS criteria?: No MEWS guidelines implemented : Yes, yellow Treat MEWS Interventions: Considered administering scheduled or prn medications/treatments as ordered Take Vital Signs Increase Vital Sign Frequency : Yellow: Q2hr x1, continue Q4hrs until patient remains green for 12hrs Escalate MEWS: Escalate: Yellow: Discuss with charge nurse and consider notifying provider and/or RRT Notify: Charge Nurse/RN Name of Charge Nurse/RN Notified: Judeth Cornfield RN      [name] 01/21/2023, 7:45 PM

## 2023-01-21 NOTE — Progress Notes (Signed)
Discussed lab results with Charge RN, Noreene Larsson.  She notified MD. New orders rec'd and implemented

## 2023-01-21 NOTE — Progress Notes (Signed)
PICC placed at bedside.  Patient became agitated when the sterile drape was applied.  Patient able to be calmed by Conrad St. Simons, RN.  Lidocaine given and needle inserted.  Patient once again became agitated stating "I can't see".  Conrad Blackburn, RN asisted with calming patient and lifting drape.  Patient continued to be agitated during procedure and pulling at the drape.  Was able to maintain immediate sterile field (with difficulty) but patient did put hand over left side of drape.  Patient denied pain but stated "I want a soda.  I want water".  Patient sleeping after drape removed.  CXR ordered due to inability to use ECG technology due to agitation and movement of patient.  Yoko, RN aware that patient was agitated during procedure.

## 2023-01-22 DIAGNOSIS — Z515 Encounter for palliative care: Secondary | ICD-10-CM

## 2023-01-22 DIAGNOSIS — I959 Hypotension, unspecified: Secondary | ICD-10-CM | POA: Diagnosis not present

## 2023-01-22 DIAGNOSIS — D696 Thrombocytopenia, unspecified: Secondary | ICD-10-CM

## 2023-01-22 DIAGNOSIS — Z7189 Other specified counseling: Secondary | ICD-10-CM

## 2023-01-22 DIAGNOSIS — F10931 Alcohol use, unspecified with withdrawal delirium: Secondary | ICD-10-CM | POA: Diagnosis not present

## 2023-01-22 DIAGNOSIS — D61818 Other pancytopenia: Secondary | ICD-10-CM | POA: Diagnosis not present

## 2023-01-22 LAB — TYPE AND SCREEN
Antibody Screen: NEGATIVE
Unit division: 0

## 2023-01-22 LAB — BPAM RBC: Unit Type and Rh: 5100

## 2023-01-22 LAB — CBC
HCT: 23.1 % — ABNORMAL LOW (ref 36.0–46.0)
HCT: 25.2 % — ABNORMAL LOW (ref 36.0–46.0)
Hemoglobin: 7.2 g/dL — ABNORMAL LOW (ref 12.0–15.0)
Hemoglobin: 7.7 g/dL — ABNORMAL LOW (ref 12.0–15.0)
MCH: 30.6 pg (ref 26.0–34.0)
MCH: 31.6 pg (ref 26.0–34.0)
MCHC: 31.2 g/dL (ref 30.0–36.0)
MCHC: 30.6 g/dL (ref 30.0–36.0)
MCV: 98.3 fL (ref 80.0–100.0)
MCV: 103.3 fL — ABNORMAL HIGH (ref 80.0–100.0)
Platelets: 14 10*3/uL — CL (ref 150–400)
Platelets: 22 10*3/uL — CL (ref 150–400)
RBC: 2.35 MIL/uL — ABNORMAL LOW (ref 3.87–5.11)
RBC: 2.44 MIL/uL — ABNORMAL LOW (ref 3.87–5.11)
RDW: 21.3 % — ABNORMAL HIGH (ref 11.5–15.5)
RDW: 21.4 % — ABNORMAL HIGH (ref 11.5–15.5)
WBC: 1.8 10*3/uL — ABNORMAL LOW (ref 4.0–10.5)
WBC: 1.8 10*3/uL — ABNORMAL LOW (ref 4.0–10.5)
nRBC: 0 % (ref 0.0–0.2)
nRBC: 1.1 % — ABNORMAL HIGH (ref 0.0–0.2)

## 2023-01-22 LAB — COMPREHENSIVE METABOLIC PANEL
ALT: 12 U/L (ref 0–44)
AST: 128 U/L — ABNORMAL HIGH (ref 15–41)
Albumin: 1.6 g/dL — ABNORMAL LOW (ref 3.5–5.0)
Alkaline Phosphatase: 81 U/L (ref 38–126)
Anion gap: 5 (ref 5–15)
BUN: <5 mg/dL — ABNORMAL LOW (ref 6–20)
CO2: 17 mmol/L — ABNORMAL LOW (ref 22–32)
Calcium: 6.7 mg/dL — ABNORMAL LOW (ref 8.9–10.3)
Chloride: 114 mmol/L — ABNORMAL HIGH (ref 98–111)
Creatinine, Ser: 0.57 mg/dL (ref 0.44–1.00)
GFR, Estimated: >60 mL/min (ref >60)
Glucose, Bld: 89 mg/dL (ref 70–99)
Potassium: 4.8 mmol/L (ref 3.5–5.1)
Sodium: 136 mmol/L (ref 135–145)
Total Bilirubin: 6.8 mg/dL — ABNORMAL HIGH (ref 0.3–1.2)
Total Protein: 6.8 g/dL (ref 6.5–8.1)

## 2023-01-22 LAB — POTASSIUM: Potassium: 3.5 mmol/L (ref 3.5–5.1)

## 2023-01-22 LAB — BPAM PLATELET PHERESIS

## 2023-01-22 LAB — MAGNESIUM: Magnesium: 1.3 mg/dL — ABNORMAL LOW (ref 1.7–2.4)

## 2023-01-22 LAB — PREPARE PLATELET PHERESIS: Unit division: 0

## 2023-01-22 LAB — LACTIC ACID, PLASMA: Lactic Acid, Venous: 4.1 mmol/L (ref 0.5–1.9)

## 2023-01-22 LAB — AMMONIA: Ammonia: 110 umol/L — ABNORMAL HIGH (ref 9–35)

## 2023-01-22 MED ORDER — MAGNESIUM SULFATE 2 GM/50ML IV SOLN
2.0000 g | Freq: Once | INTRAVENOUS | Status: AC
  Administered 2023-01-22: 2 g via INTRAVENOUS
  Filled 2023-01-22: qty 50

## 2023-01-22 MED ORDER — SODIUM CHLORIDE 0.9% IV SOLUTION: Freq: Once | INTRAVENOUS | Status: AC

## 2023-01-22 MED ORDER — LORAZEPAM 2 MG/ML IJ SOLN
2.0000 mg | Freq: Once | INTRAMUSCULAR | Status: AC
  Administered 2023-01-22: 2 mg via INTRAVENOUS

## 2023-01-22 MED ORDER — SODIUM CHLORIDE 0.9 % IV SOLN: INTRAVENOUS | Status: AC

## 2023-01-22 MED ORDER — SODIUM CHLORIDE 0.9 % IV BOLUS
500.0000 mL | Freq: Once | INTRAVENOUS | Status: AC
  Administered 2023-01-22: 500 mL via INTRAVENOUS

## 2023-01-22 MED ORDER — POTASSIUM CHLORIDE CRYS ER 20 MEQ PO TBCR
40.0000 meq | EXTENDED_RELEASE_TABLET | Freq: Once | ORAL | Status: AC
  Administered 2023-01-22: 40 meq via ORAL
  Filled 2023-01-22: qty 2

## 2023-01-22 MED ORDER — MAGNESIUM SULFATE 4 GM/100ML IV SOLN
4.0000 g | Freq: Once | INTRAVENOUS | Status: AC
  Administered 2023-01-22: 4 g via INTRAVENOUS
  Filled 2023-01-22: qty 100

## 2023-01-22 MED ORDER — SODIUM CHLORIDE 0.9 % IV SOLN: INTRAVENOUS | Status: DC

## 2023-01-22 NOTE — Progress Notes (Signed)
OT Cancellation Note  Patient Details Name: Joann [name] MRN: [id number] DOB: May 17, 1981   Cancelled Treatment:    Reason Eval/Treat Not Completed: Medical issues which prohibited therapy (Per nursing pt with low BP, runs of NSVT, low Hgb. Will defer eval today and check back tomorrow for OT evaluation as schedule permits)  Carver Fila, OTD, OTR/L SecureChat Preferred Acute Rehab [phone]   Dalphine Handing 01/22/2023, 11:09 AM

## 2023-01-22 NOTE — Progress Notes (Signed)
[facility] night coverage note:  RNs made multiple attempts to reach family.  Unable to reach family at this time.  Pt with platelets <15.  Platelet transfusion indicated.  Due to hepatic encephalopathy + alcohol withdrawal, pt with delirium pt isnt able to consent at this time.  My review of the records reveals that pt: 1) has previously consented to transfusions (07/25/22 Dr. Ophelia Charter PN documents "Patient seen and examined in the ED. Verbally consented for blood and CVC if needed." Also consents signed for transfusions on 11/15 and 11/17. 2) has previously received transfusions on 11/15 - 11/17 3) no mention of withdrawal of consent etc made at time of discharge on 12/4.  No FYI of refusal of blood products put under FYI tab as is protocol when pt refuses.  Given the emergency situation and inability to reach family, and past history of transfusions (recently) as stated above.  I feel that it is reasonable to assume consent for blood product transfusions is implied here and we should proceed ahead with platelet transfusion at this time.

## 2023-01-22 NOTE — Significant Event (Signed)
[facility] night coverage note:  DCd saline with K due to K of 4.8 and hyperchloremia this AM.  Will defer to day team if they want to start maint IVF with something else.   Will order platelet transfusion for platelets of 14 in the meantime.  (Day team wanted this for less than 15 per yesterdays PN).  Will order 2g mag sulfate for Mag of 1.3  Will order repeat CBC at 1000 after platelet transfusion (also to keep eye on HGB, looks like she may need PRBC transfusion too by that point as she's down to 7.2 from 8.9 yesterday.

## 2023-01-22 NOTE — Progress Notes (Addendum)
1948 Ativan given patient screaming in room that she wants to leave patient not orientated educational unsuccessful 2120 patient pulling off leads throwing personal items in room stating she wants to go home again educational unsuccessful ativan given 2300 patient sleeping at this time  01/23/23 0400 patient removed FMS twice states its causing abdominal cramping and used bed pan

## 2023-01-22 NOTE — Progress Notes (Signed)
Dr. Rito Ehrlich notified about patient becoming increasingly insistent on leaving. An extra 2mg  of IV Ativan will be given per order and we will continue to monitor patient.

## 2023-01-22 NOTE — Progress Notes (Signed)
Dr. Rito Ehrlich notified about runs of NSVT and low B/P.

## 2023-01-22 NOTE — Social Work (Signed)
CSW acknowledges consult for substance use education and resources. Per notes, pt was screened in ED, pt agreed to receiving resources, these ar on pt's AVS. CSW was notified that pt's spouse would like to discuss resources. Pt is currently not oriented. TOC will complete consult when pt is oriented and can participate in discussion. TOC will continue to follow.

## 2023-01-22 NOTE — Progress Notes (Signed)
PT Cancellation Note  Patient Details Name: Joann [name] MRN: [id number] DOB: August 13, 1981   Cancelled Treatment:    Reason Eval/Treat Not Completed: Patient not medically ready. Per nursing pt with low BP, runs of NSVT, low Hgb. Will defer eval today and check back tomorrow.   Angelina Ok [facility] 01/22/2023, 10:53 AM Skip Mayer PT Acute Rehabilitation Services Office [phone]

## 2023-01-22 NOTE — Consult Note (Signed)
Palliative Medicine Inpatient Consult Note  Consulting Provider: Dr. Robb Matar  Reason for consult:   Palliative Care Consult Services Palliative Medicine Consult  Reason for Consult? end of life   01/22/2023  HPI:  Per intake H&P --> 42 y.o. female past medical history of alcohol abuse, alcoholic L hello Mrs. Reuel Boom is Joann [name] calling from the [facility] palliative care team cirrhosis DTs with seizures, hepatic encephalopathy, with multiple admissions to alcohol withdrawal, MRSA bacteremia, GI bleed  in 12/23.  Presented with generalized weakness tremors nausea vomiting.  Thought to have alcohol withdrawal.  She was hospitalized for further management.   Palliative care has been asked to get involved for goals of care conversations in the setting of acute on chronic illness.   Clinical Assessment/Goals of Care:  *Please note that this is a verbal dictation therefore any spelling or grammatical errors are due to the "Dragon Medical One" system interpretation.  I have reviewed medical records including EPIC notes, labs and imaging, received report from bedside RN, assessed the patient who is in the bed in no acute distress.    I met with Joann [name] to further discuss diagnosis prognosis, GOC, EOL wishes, disposition and options. Though given her confusion and inattention I called her spouse, Joann [name] for collateral information.    I introduced Palliative Medicine as specialized medical care for people living with serious illness. It focuses on providing relief from the symptoms and stress of a serious illness. The goal is to improve quality of life for both the patient and the family.  Medical History Review and Understanding:  Past medical history of alcohol abuse, cirrhosis, MRSA bacteremia, GI bleed was reviewed.  Social History:  Joann [name] has been with her husband for the last 17 years ago been married for 12 years. She has three children ages 83, 37, 58. Her husband has a son age  85. She also has a beloved "bullie" dog. They have one grandchild and one on the way. 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  She is a woman of faith and practices within Christianity.  Functional and Nutritional State:  Prior to hospitalization Joann [name] was living her her home with her 29 yr old son and their dog. Her husband travels as a Naval architect through comes back every few days and stays home for roughly four days then goes back on the road.  Advance Directives:  A detailed discussion was had today regarding advanced directives.  Joann [name]'s primary decision maker is her spouse, Joann [name]. She does not have a living will.   Code Status:  Concepts specific to code status, artifical feeding and hydration, continued IV antibiotics and rehospitalization was had.  The difference between a aggressive medical intervention path  and a palliative comfort care path for this patient at this time was had.   Encouraged patient/family to consider DNR/DNI status understanding evidenced based poor outcomes in similar hospitalized patient, as the cause of arrest is likely associated with advanced chronic/terminal illness rather than an easily reversible acute cardio-pulmonary event. I explained that DNR/DNI does not change the medical plan and it only comes into effect after a person has arrested (died).  It is a protective measure to keep [place] from harming the patient in their last moments of life.   Patients spouse, Joann [name] would like to maintain patient as a full  code/full scope of care.   Discussion:  Reviewed with parents in the nines severe alcoholic abuse syndrome as well as the cirrhosis she is endured as a result of this abuse.  We discussed cirrhosis as a chronic disease which does not resolve over time rather worsens.I shared that even with medical care if a patient continues  drinking their liver function does not improve, it only further declines. Joann [name] shares understanding as a former addict himself. He reviewed that he has tried to help with Joann [name] get sober but this has proved to be very challenging. 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   We reviewed the reality that Joann [name] will not be considered a candidate for any transplantation with active disease and inability to stay sober.  We discussed the high likelihood of further decline and possible death due to poor self care.   Joann [name] asks for resources on sober livings to provide his spouse. I shared that I would have our MSW reach out.   Discussed the importance of continued conversation with family and their  medical providers regarding overall plan of care and treatment options, ensuring decisions are within the context of the patients values and GOCs.  Decision Maker: Joann [name],Joann [name] (Spouse): [phone] (Mobile)   SUMMARY OF RECOMMENDATIONS   Full Code/Full scope of care  Continue current care allowing time for outcomes  Open and honest conversations held with patients spouse, Joann [name] in the setting of Joann [name]'s cirrhosis and alcoholism  PMT will continue to follow  Code Status/Advance Care Planning: FULL CODE  Palliative Prophylaxis:  Aspiration, Bowel Regimen, Delirium Protocol, Frequent Pain Assessment, Oral Care, Palliative Wound Care, and Turn Reposition  Additional Recommendations (Limitations, Scope, Preferences): Continue current care  Psycho-social/Spiritual:  Desire for further Chaplaincy support: Yes Additional Recommendations: Education on alcoholism and irreversible liver damage   Prognosis: Patient is at extremely high risk of further acute decline in death given advanced liver disease, refractory ammonia level/encephalopathy and severe coagulopathy   Discharge  Planning: Discharge plan is uncertain at this time.   Vitals:   01/22/23 0500 01/22/23 0645  BP:  97/65  Pulse: (!) 108 98  Resp:  18  Temp:  99 F (37.2 C)  SpO2:  100%    Intake/Output Summary (Last 24 hours) at 01/22/2023 0715 Last data filed at 01/22/2023 0553 Gross per 24 hour  Intake 1512.24 ml  Output 400 ml  Net 1112.24 ml   Last Weight  Most recent update: 01/20/2023  2:17 PM    Weight  63.5 kg (140 lb)            Gen:  Middle aged AA F in NAD HEENT: moist mucous membranes CV: Regular rate and rhythm  PULM: clear to auscultation bilaterally  ABD: soft/nontender  EXT: No edema  Neuro: Aware of who she is an   PPS: 40%   This conversation/these recommendations were discussed with patient primary care team, Dr. Rito Ehrlich  Total Time: 29 Billing based on MDM: High  ______________________________________________________ Lamarr Lulas [facility] Palliative Medicine Team Team Cell Phone: [phone] Please utilize secure chat with additional questions, if there is no response within 30 minutes please call the above phone number  Palliative Medicine Team providers are available by phone from 7am to 7pm daily and can be reached through the team cell phone.  Should this patient require assistance outside of these hours, please call the  patient's attending physician.

## 2023-01-22 NOTE — Progress Notes (Signed)
   01/22/23 2013  Assess: MEWS Score  Temp 97.7 F (36.5 C)  BP 99/63  MAP (mmHg) 75  Pulse Rate (!) 113  ECG Heart Rate (!) 113  Resp 18  SpO2 100 %  O2 Device Room Air  Assess: MEWS Score  MEWS Temp 0  MEWS Systolic 1  MEWS Pulse 2  MEWS RR 0  MEWS LOC 0  MEWS Score 3  MEWS Score Color Yellow  Assess: if the MEWS score is Yellow or Red  Were vital signs taken at a resting state? No  Focused Assessment No change from prior assessment  Does the patient meet 2 or more of the SIRS criteria? No  MEWS guidelines implemented  No, other (Comment)  Assess: SIRS CRITERIA  SIRS Temperature  0  SIRS Pulse 1  SIRS Respirations  0  SIRS WBC 1  SIRS Score Sum  2   Patient screaming on CIWA protocol patient has been in ST with V runs low electrolytes PRN medications given and were juts changed prior to shift change

## 2023-01-22 NOTE — Progress Notes (Signed)
Unable to reach emergency contacts listed on patient's chart numerous attempts made to husband daughter.  Voicemail left on husband's phone and daughters voice mailbox full.   Julian Reil MD notified. See chart for updates

## 2023-01-22 NOTE — Progress Notes (Addendum)
[facility] PROGRESS NOTE   Joann [name]:[id number] DOB: 1981/01/05 DOA: 01/20/2023  PCP: Zigmund Daniel., MD  Brief History/Interval Summary: 42 y.o. 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  Presented with generalized weakness tremors nausea vomiting.  Thought to have alcohol withdrawal.  She was hospitalized for further management.   Consultants: None  Procedures: None    Subjective/Interval History: Patient wants something to eat this morning.  Denies any abdominal pain.  No other complaints offered.  No bleeding episodes reported by nursing staff.    Assessment/Plan:  Alcohol withdrawal syndrome Noted to be delirious when she was hospitalized.  She was placed on CIWA protocol.  Thiamine folic acid multivitamins.  Continues to have symptoms and signs of withdrawal.  Continue CIWA protocol.  Continue to monitor on telemetry.    Acute hepatic encephalopathy Level was significantly elevated at 178.  Started on lactulose.  She was also on rifaximin prior to admission which was also continued.  Ammonia level improved to 210 today.  Mentation appears to have improved as well.   Pancytopenia Secondary to bone marrow suppression from consuming excessive amounts of alcohol.  No evidence for overt bleeding.   Drop in hemoglobin and platelet counts noted.  Platelet transfusion ordered this morning.  Agree with emergency transfusion protocol since patient unable to give consent and unable to reach family member and she remains at very high risk of bleeding if she is not transfused with platelets. May need PRBCs as well.  Will follow-up on CBC later today.    Hypokalemia and hypomagnesemia Aggressively repleted.  Potassium level is  now normal.  Magnesium remains low and will be repleted.    Hypotension Most likely due to hypovolemia and medications.  Normal saline bolus will be ordered.  NSVT Tele shows NSVT. Likely due to electrolyte abnormalities. ECHO from 06/2022 shoed normal LVEF. Will replace Mg and K and continue to monitor. Patient remains asymptomatic.  Lactic acidosis Most likely related to alcoholism.  Improvement in levels noted.  Bruising over left upper arm No obvious hematoma noted on examination.  Monitor for now.  Alcoholic liver cirrhosis/status post TIPS Monitor closely.  Goals of care Palliative care has been consulted.  DVT Prophylaxis: SCDs only for now Code Status: Full code Family Communication: No family at bedside. Disposition Plan: Hopefully return home when improved  Status is: Inpatient Remains inpatient appropriate because: Alcohol withdrawal      Medications: Scheduled:  Chlorhexidine Gluconate Cloth  6 each Topical Daily   folic acid  1 mg Oral Daily   lactulose  20 g Oral BID   LORazepam  0-4 mg Intravenous Q4H   Followed by   LORazepam  0-4 mg Intravenous Q8H   multivitamin with minerals  1 tablet Oral Daily   pantoprazole  40 mg Oral BID   rifaximin  550 mg Oral BID   sodium chloride flush  10-40 mL Intracatheter Q12H   sucralfate  1 g Oral TID WC & HS   thiamine  100 mg Oral Daily   Or   thiamine  100 mg Intravenous Daily   Continuous:  magnesium sulfate bolus IVPB 4 g (01/22/23 0901)   WJX:BJYNWGNFAOZHY **OR** acetaminophen, LORazepam **OR** LORazepam, ondansetron **OR** ondansetron (ZOFRAN) IV, mouth rinse, sodium chloride flush  Antibiotics: Anti-infectives (From  admission, onward)    Start     Dose/Rate Route Frequency Ordered Stop   01/20/23 2200  rifaximin (XIFAXAN) tablet 550 mg        550 mg Oral 2 times daily 01/20/23 1859         Objective:  Vital Signs  Vitals:   01/22/23 0740 01/22/23 0742 01/22/23 0908 01/22/23 0915  BP: 104/67  98/74 118/77 100/83  Pulse:   (!) 113 (!) 113  Resp:   17 18  Temp:   97.6 F (36.4 C) 98.1 F (36.7 C)  TempSrc:   Oral Oral  SpO2: 100% 100%  100%  Weight:      Height:        Intake/Output Summary (Last 24 hours) at 01/22/2023 0932 Last data filed at 01/22/2023 0915 Gross per 24 hour  Intake 1791.24 ml  Output 400 ml  Net 1391.24 ml    Filed Weights   01/20/23 1417  Weight: 63.5 kg    General appearance: Awake alert.  In no distress.  Distracted Resp: Clear to auscultation bilaterally.  Normal effort Cardio: S1-S2 is normal regular.  No S3-S4.  No rubs murmurs or bruit GI: Abdomen is soft.  Nontender nondistended.  Bowel sounds are present normal.  No masses organomegaly Extremities: Bruising noted over the left upper arm.  No swelling or hematoma noted. Neurologic:  No focal neurological deficits.    Lab Results:  Data Reviewed: I have personally reviewed following labs and reports of the imaging studies  CBC: Recent Labs  Lab 01/20/23 1450 01/21/23 0247 01/22/23 0320  WBC 3.8* 2.5* 1.8*  NEUTROABS 2.9  --   --   HGB 9.0* 8.9* 7.2*  HCT 29.0* 28.0* 23.1*  MCV 99.3 97.2 98.3  PLT 27* 18* 14*     Basic Metabolic Panel: Recent Labs  Lab 01/20/23 1450 01/21/23 0247 01/22/23 0320  NA 138 135 136  K 2.5* 3.0* 4.8  CL 103 102 114*  CO2 20* 21* 17*  GLUCOSE 199* 121* 89  BUN <5* <5* <5*  CREATININE 0.81 0.71 0.57  CALCIUM 7.3* 7.6* 6.7*  MG 1.1* 1.3* 1.3*     GFR: Estimated Creatinine Clearance: 78.3 mL/min (by C-G formula based on SCr of 0.57 mg/dL).  Liver Function Tests: Recent Labs  Lab 01/20/23 1450 01/22/23 0320  AST 149* 128*  ALT 13 12  ALKPHOS 102 81  BILITOT 5.2* 6.8*  PROT 9.3* 6.8  ALBUMIN 2.4* 1.6*      Recent Labs  Lab 01/20/23 1450 01/21/23 0601 01/22/23 0709  AMMONIA 178* 130* 110*      CBG: Recent Labs  Lab 01/20/23 1538  GLUCAP 225*     Radiology Studies: DG Chest Port 1 View  Result Date:  01/21/2023 CLINICAL DATA:  PICC line EXAM: PORTABLE CHEST 1 VIEW COMPARISON:  08/06/2022 FINDINGS: Right upper extremity central venous catheter tip to the cavoatrial region. Clear lung fields. Normal cardiac size. No pneumothorax. IMPRESSION: Right upper extremity central venous catheter tip overlies the cavoatrial region. Electronically Signed   By: Jasmine Pang M.D.   On: 01/21/2023 17:15   [place] EKG SITE RITE  Result Date: 01/21/2023 If Site Rite image not attached, placement could not be confirmed due to current cardiac rhythm.      LOS: 2 days   Nadiya Pieratt Rito Ehrlich  [facility] Pager on [email or web address]  01/22/2023, 9:32 AM

## 2023-01-23 DIAGNOSIS — F10931 Alcohol use, unspecified with withdrawal delirium: Secondary | ICD-10-CM | POA: Diagnosis not present

## 2023-01-23 LAB — CBC WITH DIFFERENTIAL/PLATELET
Abs Immature Granulocytes: 0.01 10*3/uL (ref 0.00–0.07)
Basophils Absolute: 0 10*3/uL (ref 0.0–0.1)
Basophils Relative: 0 %
Eosinophils Absolute: 0 10*3/uL (ref 0.0–0.5)
Eosinophils Relative: 2 %
HCT: 23.5 % — ABNORMAL LOW (ref 36.0–46.0)
Hemoglobin: 7.3 g/dL — ABNORMAL LOW (ref 12.0–15.0)
Immature Granulocytes: 0 %
Lymphocytes Relative: 27 %
Lymphs Abs: 0.6 10*3/uL — ABNORMAL LOW (ref 0.7–4.0)
MCH: 30.8 pg (ref 26.0–34.0)
MCHC: 31.1 g/dL (ref 30.0–36.0)
MCV: 99.2 fL (ref 80.0–100.0)
Monocytes Absolute: 0.2 10*3/uL (ref 0.1–1.0)
Monocytes Relative: 10 %
Neutro Abs: 1.4 10*3/uL — ABNORMAL LOW (ref 1.7–7.7)
Neutrophils Relative %: 61 %
Platelets: 23 10*3/uL — CL (ref 150–400)
RBC: 2.37 MIL/uL — ABNORMAL LOW (ref 3.87–5.11)
RDW: 21.6 % — ABNORMAL HIGH (ref 11.5–15.5)
WBC: 2.3 10*3/uL — ABNORMAL LOW (ref 4.0–10.5)
nRBC: 0 % (ref 0.0–0.2)

## 2023-01-23 LAB — MAGNESIUM: Magnesium: 1.7 mg/dL (ref 1.7–2.4)

## 2023-01-23 LAB — COMPREHENSIVE METABOLIC PANEL
ALT: 12 U/L (ref 0–44)
AST: 97 U/L — ABNORMAL HIGH (ref 15–41)
Albumin: 1.6 g/dL — ABNORMAL LOW (ref 3.5–5.0)
Alkaline Phosphatase: 78 U/L (ref 38–126)
Anion gap: 7 (ref 5–15)
BUN: <5 mg/dL — ABNORMAL LOW (ref 6–20)
CO2: 17 mmol/L — ABNORMAL LOW (ref 22–32)
Calcium: 6.9 mg/dL — ABNORMAL LOW (ref 8.9–10.3)
Chloride: 111 mmol/L (ref 98–111)
Creatinine, Ser: 0.55 mg/dL (ref 0.44–1.00)
GFR, Estimated: >60 mL/min (ref >60)
Glucose, Bld: 87 mg/dL (ref 70–99)
Potassium: 3.8 mmol/L (ref 3.5–5.1)
Sodium: 135 mmol/L (ref 135–145)
Total Bilirubin: 5.7 mg/dL — ABNORMAL HIGH (ref 0.3–1.2)
Total Protein: 6.7 g/dL (ref 6.5–8.1)

## 2023-01-23 LAB — LACTIC ACID, PLASMA
Lactic Acid, Venous: 3.6 mmol/L (ref 0.5–1.9)
Lactic Acid, Venous: 2.5 mmol/L (ref 0.5–1.9)

## 2023-01-23 LAB — BPAM PLATELET PHERESIS
Blood Product Expiration Date: 202405152359
ISSUE DATE / TIME: 202405140639
Unit Type and Rh: 6200

## 2023-01-23 LAB — PREPARE PLATELET PHERESIS

## 2023-01-23 LAB — AMMONIA: Ammonia: 133 umol/L — ABNORMAL HIGH (ref 9–35)

## 2023-01-23 MED ORDER — MAGNESIUM SULFATE 2 GM/50ML IV SOLN
2.0000 g | Freq: Once | INTRAVENOUS | Status: AC
  Administered 2023-01-23: 2 g via INTRAVENOUS
  Filled 2023-01-23: qty 50

## 2023-01-23 MED ORDER — POTASSIUM CHLORIDE CRYS ER 20 MEQ PO TBCR
40.0000 meq | EXTENDED_RELEASE_TABLET | Freq: Once | ORAL | Status: AC
  Administered 2023-01-23: 40 meq via ORAL
  Filled 2023-01-23: qty 2

## 2023-01-23 MED ORDER — LACTULOSE 10 GM/15ML PO SOLN
20.0000 g | Freq: Three times a day (TID) | ORAL | Status: DC
  Administered 2023-01-23 – 2023-01-24 (×5): 20 g via ORAL
  Filled 2023-01-23 (×5): qty 30

## 2023-01-23 NOTE — Progress Notes (Signed)
Physician made aware of blood noted around PICC dressing. Patient had been thrashing around earlier when she was determined to leave. Dressing to be changed, will monitor for changes. See flowsheets for dressing change.

## 2023-01-23 NOTE — Plan of Care (Signed)
  Problem: Education: Goal: Knowledge of General Education information will improve Description: Including pain rating scale, medication(s)/side effects and non-pharmacologic comfort measures Outcome: Not Progressing   Problem: Health Behavior/Discharge Planning: Goal: Ability to manage health-related needs will improve Outcome: Not Progressing   Problem: Clinical Measurements: Goal: Ability to maintain clinical measurements within normal limits will improve Outcome: Not Progressing Goal: Will remain free from infection Outcome: Not Progressing Goal: Diagnostic test results will improve Outcome: Not Progressing Goal: Respiratory complications will improve Outcome: Not Progressing Goal: Cardiovascular complication will be avoided Outcome: Not Progressing   Problem: Activity: Goal: Risk for activity intolerance will decrease Outcome: Not Progressing   Problem: Nutrition: Goal: Adequate nutrition will be maintained Outcome: Not Progressing   Problem: Coping: Goal: Level of anxiety will decrease Outcome: Not Progressing   Problem: Elimination: Goal: Will not experience complications related to bowel motility Outcome: Not Progressing Goal: Will not experience complications related to urinary retention Outcome: Not Progressing   Problem: Pain Managment: Goal: General experience of comfort will improve Outcome: Progressing   Problem: Safety: Goal: Ability to remain free from injury will improve Outcome: Not Progressing   Problem: Skin Integrity: Goal: Risk for impaired skin integrity will decrease Outcome: Not Progressing

## 2023-01-23 NOTE — Plan of Care (Signed)
  Problem: Education: Goal: Knowledge of General Education information will improve Description: Including pain rating scale, medication(s)/side effects and non-pharmacologic comfort measures 01/23/2023 0814 by Herma Carson, RN Outcome: Progressing 01/23/2023 0814 by Herma Carson, RN Outcome: Progressing   Problem: Health Behavior/Discharge Planning: Goal: Ability to manage health-related needs will improve 01/23/2023 0814 by Herma Carson, RN Outcome: Progressing 01/23/2023 0814 by Herma Carson, RN Outcome: Progressing   Problem: Clinical Measurements: Goal: Ability to maintain clinical measurements within normal limits will improve 01/23/2023 0814 by Herma Carson, RN Outcome: Progressing 01/23/2023 0814 by Herma Carson, RN Outcome: Progressing Goal: Will remain free from infection 01/23/2023 0814 by Herma Carson, RN Outcome: Progressing 01/23/2023 0814 by Herma Carson, RN Outcome: Progressing Goal: Diagnostic test results will improve 01/23/2023 0814 by Herma Carson, RN Outcome: Progressing 01/23/2023 0814 by Herma Carson, RN Outcome: Progressing Goal: Respiratory complications will improve 01/23/2023 0814 by Herma Carson, RN Outcome: Progressing 01/23/2023 0814 by Herma Carson, RN Outcome: Progressing Goal: Cardiovascular complication will be avoided 01/23/2023 0814 by Herma Carson, RN Outcome: Progressing 01/23/2023 0814 by Herma Carson, RN Outcome: Progressing   Problem: Activity: Goal: Risk for activity intolerance will decrease 01/23/2023 0814 by Herma Carson, RN Outcome: Progressing 01/23/2023 0814 by Herma Carson, RN Outcome: Progressing   Problem: Nutrition: Goal: Adequate nutrition will be maintained 01/23/2023 0814 by Herma Carson, RN Outcome: Progressing 01/23/2023 0814 by Herma Carson, RN Outcome: Progressing   Problem: Coping: Goal: Level of anxiety will decrease 01/23/2023 0814 by Herma Carson, RN Outcome:  Progressing 01/23/2023 0814 by Herma Carson, RN Outcome: Progressing   Problem: Elimination: Goal: Will not experience complications related to bowel motility 01/23/2023 0814 by Herma Carson, RN Outcome: Progressing 01/23/2023 0814 by Herma Carson, RN Outcome: Progressing Goal: Will not experience complications related to urinary retention 01/23/2023 0814 by Herma Carson, RN Outcome: Progressing 01/23/2023 0814 by Herma Carson, RN Outcome: Progressing   Problem: Pain Managment: Goal: General experience of comfort will improve 01/23/2023 0814 by Herma Carson, RN Outcome: Progressing 01/23/2023 0814 by Herma Carson, RN Outcome: Progressing   Problem: Safety: Goal: Ability to remain free from injury will improve 01/23/2023 0814 by Herma Carson, RN Outcome: Progressing 01/23/2023 0814 by Herma Carson, RN Outcome: Progressing   Problem: Skin Integrity: Goal: Risk for impaired skin integrity will decrease 01/23/2023 0814 by Herma Carson, RN Outcome: Progressing 01/23/2023 0814 by Herma Carson, RN Outcome: Progressing

## 2023-01-23 NOTE — Evaluation (Signed)
Physical Therapy Evaluation Patient Details Name: Joann [name] MRN: [id number] DOB: 08-Jan-1981 Today's Date: 01/23/2023  History of Present Illness  Pt is 42 year old presented to [facility] on  01/20/23 for alcohol withdrawal syndrome and acute hepatic encephalopathy. PMH - alcoholic cirrhosis, hepatic encephalopathy, etOH abuse, alcoholic pancreatitis and esophageal varices.  Clinical Impression  Pt presents to PT with slightly unsteady gait due to etoh withdrawal, poor cognition and inactivity. Expect pt will make good progress back to baseline with mobility. Will follow acutely but doubt pt will need PT after DC.         Recommendations for follow up therapy are one component of a multi-disciplinary discharge planning process, led by the attending physician.  Recommendations may be updated based on patient status, additional functional criteria and insurance authorization.  Follow Up Recommendations       Assistance Recommended at Discharge Intermittent Supervision/Assistance  Patient can return home with the following  Direct supervision/assist for medications management;Direct supervision/assist for financial management    Equipment Recommendations None recommended by PT  Recommendations for Other Services       Functional Status Assessment Patient has had a recent decline in their functional status and demonstrates the ability to make significant improvements in function in a reasonable and predictable amount of time.     Precautions / Restrictions Precautions Precautions: Fall Restrictions Weight Bearing Restrictions: No      Mobility  Bed Mobility Overal bed mobility: Needs Assistance Bed Mobility: Supine to Sit, Sit to Supine     Supine to sit: Supervision Sit to supine: Supervision   General bed mobility comments: For safety    Transfers Overall transfer level: Needs assistance Equipment used: 1 person hand held assist, 2 person hand held assist Transfers: Sit  to/from Stand Sit to Stand: Min assist, +2 safety/equipment, +2 physical assistance           General transfer comment: Assist to power up and for stability. 2 person hand held from bed and 1 person from commode with grab bar. Pt with significant difficulty processing how to back up to commode to sit down.    Ambulation/Gait Ambulation/Gait assistance: Min assist, +2 physical assistance, +2 safety/equipment Gait Distance (Feet): 12 Feet (x 2) Assistive device: Rolling walker (2 wheels), 2 person hand held assist Gait Pattern/deviations: Step-through pattern, Decreased step length - right, Decreased step length - left, Shuffle Gait velocity: decr Gait velocity interpretation: <1.31 ft/sec, indicative of household ambulator   General Gait Details: Assist for balance. With hand held required +2 min assist and with walker required min assist +2 for safety/equipment  Stairs            Wheelchair Mobility    Modified Rankin (Stroke Patients Only)       Balance Overall balance assessment: Needs assistance Sitting-balance support: No upper extremity supported, Feet supported Sitting balance-Leahy Scale: Good     Standing balance support: No upper extremity supported, During functional activity Standing balance-Leahy Scale: Fair                               Pertinent Vitals/Pain Pain Assessment Pain Assessment: No/denies pain    Home Living Family/patient expects to be discharged to:: Private residence Living Arrangements: Spouse/significant other;Children (spouse works as Naval architect) Available Help at Discharge: Family;Available PRN/intermittently Type of Home: House Home Access: Stairs to enter   Entrance Stairs-Number of Steps: 4 Alternate Level Stairs-Number of Steps: 6 Home  Layout: Two level;Able to live on main level with bedroom/bathroom Home Equipment: None      Prior Function Prior Level of Function : Independent/Modified Independent                      Hand Dominance   Dominant Hand: Right    Extremity/Trunk Assessment   Upper Extremity Assessment Upper Extremity Assessment: Defer to OT evaluation    Lower Extremity Assessment Lower Extremity Assessment: Generalized weakness       Communication   Communication: No difficulties  Cognition Arousal/Alertness: Awake/alert Behavior During Therapy: WFL for tasks assessed/performed Overall Cognitive Status: Impaired/Different from baseline Area of Impairment: Attention, Memory, Following commands, Safety/judgement, Awareness, Problem solving                   Current Attention Level: Selective Memory: Decreased short-term memory, Decreased recall of precautions Following Commands: Follows one step commands with increased time Safety/Judgement: Decreased awareness of safety, Decreased awareness of deficits Awareness: Intellectual Problem Solving: Requires verbal cues, Slow processing, Decreased initiation, Requires tactile cues, Difficulty sequencing General Comments: Pt with poor insight into medical issues        General Comments General comments (skin integrity, edema, etc.): HR to 130's toward end of session    Exercises     Assessment/Plan    PT Assessment Patient needs continued PT services  PT Problem List Decreased strength;Decreased balance;Decreased mobility;Decreased cognition;Decreased safety awareness       PT Treatment Interventions DME instruction;Gait training;Stair training;Functional mobility training;Therapeutic activities;Therapeutic exercise;Balance training;Cognitive remediation;Patient/family education    PT Goals (Current goals can be found in the Care Plan section)  Acute Rehab PT Goals Patient Stated Goal: go home PT Goal Formulation: With patient Time For Goal Achievement: 02/06/23 Potential to Achieve Goals: Good    Frequency Min 1X/week     Co-evaluation PT/OT/SLP Co-Evaluation/Treatment: Yes Reason  for Co-Treatment: Necessary to address cognition/behavior during functional activity;For patient/therapist safety PT goals addressed during session: Mobility/safety with mobility         AM-PAC PT "6 Clicks" Mobility  Outcome Measure Help needed turning from your back to your side while in a flat bed without using bedrails?: None Help needed moving from lying on your back to sitting on the side of a flat bed without using bedrails?: None Help needed moving to and from a bed to a chair (including a wheelchair)?: A Little Help needed standing up from a chair using your arms (e.g., wheelchair or bedside chair)?: A Little Help needed to walk in hospital room?: A Little Help needed climbing 3-5 steps with a railing? : A Little 6 Click Score: 20    End of Session   Activity Tolerance: Patient tolerated treatment well Patient left: in bed;with call bell/phone within reach;with chair alarm set Nurse Communication: Mobility status;Other (comment) (Left purewick off) PT Visit Diagnosis: Unsteadiness on feet (R26.81);Other abnormalities of gait and mobility (R26.89);Muscle weakness (generalized) (M62.81)    Time: 5176-1607 PT Time Calculation (min) (ACUTE ONLY): 27 min   Charges:   PT Evaluation $PT Eval Low Complexity: 1 Low          [facility] PT Acute Rehabilitation Services Office [phone]   Angelina Ok [facility] 01/23/2023, 10:44 AM

## 2023-01-23 NOTE — Progress Notes (Addendum)
PROGRESS NOTE    Joann [name]  ZOX:[id number] DOB: 02-20-1981 DOA: 01/20/2023 PCP: Zigmund Daniel., MD   Brief Narrative: : 42 y.o. 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  Presented with generalized weakness tremors nausea vomiting.  Admitted for alcohol withdrawal.    Assessment & Plan:   Principal Problem:   Alcohol withdrawal delirium, acute, hyperactive (HCC) Active Problems:   Alcohol withdrawal syndrome with complication (HCC)   Alcoholic cirrhosis (HCC)   Transaminitis   Hypokalemia   Hypomagnesemia   Acute hepatic encephalopathy (HCC)  Alcohol withdrawal syndrome-admitted with delirium started on CIWA protocol thiamine folic acid and multivitamins.   Acute hepatic encephalopathy-lactulose 133 from 110. Increase lactulose to 3 times daily.   Pancytopenia Secondary to bone marrow suppression from consuming excessive amounts of alcohol.  No evidence for overt bleeding.   Drop in hemoglobin and platelet counts noted.   She received a unit of platelet transfusion on 01/22/2023  Platelets 23 from 14 after platelet transfusion.  Hypokalemia and hypomagnesemia-resolved.  Hypotension she was started on IV fluids 01/22/2023.  Blood pressure has improved but still remains soft.  Continue IV fluids and monitor closely for fluid retention.  NSVT Tele shows NSVT. Likely due to electrolyte abnormalities. ECHO from 06/2022 shoed normal LVEF. Potassium 3.8 magnesium 1.7.  Will try to maintain it above 4 and 2 respectively.  Lactic acidosis-4.1 from 3.4 from 6.5 from 6.1 Trend lactic acid.   Bruising over left upper arm No obvious hematoma noted on examination.  Monitor for now.   Alcoholic liver cirrhosis/status post TIPS Monitor  closely.   Goals of care-appreciate palliative care input.  Patient is full code.  Estimated body mass index is 24.03 kg/m as calculated from the following:   Height as of this encounter: 5\' 4"  (1.626 m).   Weight as of this encounter: 63.5 kg.  DVT prophylaxis: none Code Status:full Family Communication:none at bedside Disposition Plan:  Status is: Inpatient Remains inpatient appropriate because: Alcohol withdrawal thrombocytopenia severe anemia pancytopenia   Consultants:  none  Procedures: none Antimicrobials:none  Subjective: Patient is resting in bed asking for cranberry juice wants to eat breakfast no events reported overnight by the staff She received a unit of platelet transfusion yesterday  Objective: Vitals:   01/23/23 0040 01/23/23 0055 01/23/23 0402 01/23/23 0833  BP: 119/77 93/61 103/81   Pulse:   99   Resp:   17 18  Temp:   98.8 F (37.1 C) 98.9 F (37.2 C)  TempSrc:   Oral Oral  SpO2:   94% 100%  Weight:      Height:        Intake/Output Summary (Last 24 hours) at 01/23/2023 1051 Last data filed at 01/23/2023 0836 Gross per 24 hour  Intake 2561.7 ml  Output 4350 ml  Net -1788.3 ml   Filed Weights   01/20/23 1417  Weight: 63.5 kg    Examination:  General exam: Appears in no acute distress.  Chronically ill-appearing. Respiratory system: Diminished breath sounds  cardiovascular system: S1 & S2 heard, RRR. No JVD, murmurs, rubs, gallops or clicks. No pedal edema. Gastrointestinal system: Abdomen is nondistended, soft and nontender. No organomegaly or masses felt. Normal bowel sounds heard. Central nervous system-awake oriented to place Extremities: Left upper extremity bruises noted   Data  Reviewed: I have personally reviewed following labs and imaging studies  CBC: Recent Labs  Lab 01/20/23 1450 01/21/23 0247 01/22/23 0320 01/22/23 1105 01/23/23 0531  WBC 3.8* 2.5* 1.8* 1.8* 2.3*  NEUTROABS 2.9  --   --   --  1.4*  HGB 9.0* 8.9* 7.2*  7.7* 7.3*  HCT 29.0* 28.0* 23.1* 25.2* 23.5*  MCV 99.3 97.2 98.3 103.3* 99.2  PLT 27* 18* 14* 22* 23*   Basic Metabolic Panel: Recent Labs  Lab 01/20/23 1450 01/21/23 0247 01/22/23 0320 01/22/23 1105 01/23/23 0531  NA 138 135 136  --  135  K 2.5* 3.0* 4.8 3.5 3.8  CL 103 102 114*  --  111  CO2 20* 21* 17*  --  17*  GLUCOSE 199* 121* 89  --  87  BUN <5* <5* <5*  --  <5*  CREATININE 0.81 0.71 0.57  --  0.55  CALCIUM 7.3* 7.6* 6.7*  --  6.9*  MG 1.1* 1.3* 1.3*  --  1.7   GFR: Estimated Creatinine Clearance: 78.3 mL/min (by C-G formula based on SCr of 0.55 mg/dL). Liver Function Tests: Recent Labs  Lab 01/20/23 1450 01/22/23 0320 01/23/23 0531  AST 149* 128* 97*  ALT 13 12 12   ALKPHOS 102 81 78  BILITOT 5.2* 6.8* 5.7*  PROT 9.3* 6.8 6.7  ALBUMIN 2.4* 1.6* 1.6*   No results for input(s): "LIPASE", "AMYLASE" in the last 168 hours. Recent Labs  Lab 01/20/23 1450 01/21/23 0601 01/22/23 0709 01/23/23 0531  AMMONIA 178* 130* 110* 133*   Coagulation Profile: No results for input(s): "INR", "PROTIME" in the last 168 hours. Cardiac Enzymes: No results for input(s): "CKTOTAL", "CKMB", "CKMBINDEX", "TROPONINI" in the last 168 hours. BNP (last 3 results) No results for input(s): "PROBNP" in the last 8760 hours. HbA1C: No results for input(s): "HGBA1C" in the last 72 hours. CBG: Recent Labs  Lab 01/20/23 1538  GLUCAP 225*   Lipid Profile: No results for input(s): "CHOL", "HDL", "LDLCALC", "TRIG", "CHOLHDL", "LDLDIRECT" in the last 72 hours. Thyroid Function Tests: No results for input(s): "TSH", "T4TOTAL", "FREET4", "T3FREE", "THYROIDAB" in the last 72 hours. Anemia Panel: No results for input(s): "VITAMINB12", "FOLATE", "FERRITIN", "TIBC", "IRON", "RETICCTPCT" in the last 72 hours. Sepsis Labs: Recent Labs  Lab 01/20/23 1450 01/20/23 1932 01/21/23 0601 01/22/23 1115  LATICACIDVEN 6.1* 6.5* 3.4* 4.1*    No results found for this or any previous visit (from  the past 240 hour(s)).       Radiology Studies: DG Chest Port 1 View  Result Date: 01/21/2023 CLINICAL DATA:  PICC line EXAM: PORTABLE CHEST 1 VIEW COMPARISON:  08/06/2022 FINDINGS: Right upper extremity central venous catheter tip to the cavoatrial region. Clear lung fields. Normal cardiac size. No pneumothorax. IMPRESSION: Right upper extremity central venous catheter tip overlies the cavoatrial region. Electronically Signed   By: Jasmine Pang M.D.   On: 01/21/2023 17:15   [place] EKG SITE RITE  Result Date: 01/21/2023 If Site Rite image not attached, placement could not be confirmed due to current cardiac rhythm.       Scheduled Meds:  Chlorhexidine Gluconate Cloth  6 each Topical Daily   folic acid  1 mg Oral Daily   lactulose  20 g Oral BID   LORazepam  0-4 mg Intravenous Q8H   multivitamin with minerals  1 tablet Oral Daily   pantoprazole  40 mg Oral BID   rifaximin  550 mg Oral BID   sodium chloride flush  10-40 mL Intracatheter Q12H  sucralfate  1 g Oral TID WC & HS   thiamine  100 mg Oral Daily   Or   thiamine  100 mg Intravenous Daily   Continuous Infusions:  sodium chloride 100 mL/hr at 01/23/23 0600     LOS: 3 days   Time spent:39 min  Alwyn Ren, MD  01/23/2023, 10:51 AM

## 2023-01-23 NOTE — Progress Notes (Addendum)
Daily Progress Note   Patient Name: Joann [name]       Date: 01/23/2023 DOB: [birth year]  Age: 42 y.o. MRN#: [id number] Attending Physician: Alwyn Ren, MD Primary Care Physician: Zigmund Daniel., MD Admit Date: 01/20/2023  Reason for Consultation/Follow-up: Establishing goals of care  Subjective: Patient was eating breakfast this morning. Says she had a rough night last night. She keeps repeating she needs to go home.  Length of Stay: 3  Current Medications: Scheduled Meds:   Chlorhexidine Gluconate Cloth  6 each Topical Daily   folic acid  1 mg Oral Daily   lactulose  20 g Oral TID   LORazepam  0-4 mg Intravenous Q8H   multivitamin with minerals  1 tablet Oral Daily   pantoprazole  40 mg Oral BID   rifaximin  550 mg Oral BID   sodium chloride flush  10-40 mL Intracatheter Q12H   sucralfate  1 g Oral TID WC & HS   thiamine  100 mg Oral Daily   Or   thiamine  100 mg Intravenous Daily    Continuous Infusions:  sodium chloride 100 mL/hr at 01/23/23 0600   magnesium sulfate bolus IVPB 2 g (01/23/23 1120)    PRN Meds: acetaminophen **OR** acetaminophen, LORazepam **OR** LORazepam, ondansetron **OR** ondansetron (ZOFRAN) IV, mouth rinse, sodium chloride flush  Physical Exam Vitals reviewed.  Eyes:     Comments: jaundice  Pulmonary:     Effort: Pulmonary effort is normal.  Musculoskeletal:     Comments: Weakness   Skin:    General: Skin is warm and dry.  Neurological:     Comments: tremors             Vital Signs: BP (!) 88/76   Pulse 99   Temp 98.9 F (37.2 C) (Oral)   Resp 18   Ht 5\' 4"  (1.626 m)   Wt 63.5 kg   SpO2 100%   BMI 24.03 kg/m  SpO2: SpO2: 100 % O2 Device: O2 Device: Room Air O2 Flow Rate:    Intake/output summary:   Intake/Output Summary (Last 24 hours) at 01/23/2023 1212 Last data filed at 01/23/2023 0836 Gross per 24 hour  Intake 2201.7 ml  Output 3400 ml  Net -1198.3 ml   LBM: Last BM Date : 01/22/23 Baseline Weight: Weight: 63.5  kg Most recent weight: Weight: 63.5 kg       Palliative Assessment/Data: 40%      Patient Active Problem List   Diagnosis Date Noted   Alcohol withdrawal delirium, acute, hyperactive (HCC) 01/20/2023   Hypomagnesemia 01/20/2023   Acute hepatic encephalopathy (HCC) 01/20/2023   GI bleed 07/25/2022   Bleeding esophageal varices (HCC) 07/25/2022   AVM (arteriovenous malformation) of small bowel, acquired with hemorrhage 07/25/2022   Esophageal varices in cirrhosis (HCC) 07/25/2022   Hematemesis 07/25/2022   Acute upper GI bleed 07/01/2022   Sepsis due to methicillin susceptible Staphylococcus aureus (MSSA) with acute organ dysfunction and septic shock (HCC) 07/01/2022   Lactic acidosis 07/01/2022   AKI (acute kidney injury) (HCC) 07/01/2022   Essential hypertension 07/01/2022   Ileus (HCC) 07/01/2022   Ulcer of esophagus without bleeding    Portal hypertensive gastropathy (HCC)    Alcohol withdrawal syndrome with complication (HCC)    Alcoholic hepatitis    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)    Abnormal LFTs    Alcoholic gastritis with bleeding 04/17/2022   ETOH abuse 04/16/2022   Electrolyte abnormality 02/24/2022   Leukopenia 02/24/2022   Coagulopathy (HCC) 02/24/2022   Severe protein-calorie malnutrition (HCC) 02/24/2022   Anxiety    Symptomatic anemia 12/17/2021   Esophageal varices without bleeding (HCC) 10/31/2021   Alcoholic cirrhosis (HCC) 12/31/2020   Reactive depression    Wernicke's encephalopathy    Transaminitis    Steroid-induced hyperglycemia    Leucocytosis    Macrocytic anemia    Sleep disturbance    Pressure injury of skin 01/05/2020   Alcoholic hepatitis with ascites    Acute respiratory failure [facility])     Palliative care encounter    Elevated bilirubin    Ascites    Advanced care planning/counseling discussion    Goals of care, counseling/discussion    Palliative care by specialist    Hypoalbuminemia    Encephalopathy, hepatic (HCC) 12/24/2019   Hypokalemia 04/02/2019   Alcohol dependence with withdrawal, unspecified (HCC) 10/24/2018   Thrombocytopenia (HCC) 10/24/2018   Alcohol abuse with alcohol-induced mood disorder (HCC) 09/14/2017    Palliative Care Assessment & Plan   Patient Profile: 42 y.o. female past medical history of alcohol abuse, alcoholic cirrhosis, DTs with seizures, hepatic encephalopathy, with multiple admissions to alcohol withdrawal, MRSA bacteremia, GI bleed  in 12/23, status post TIPS.  Presented with generalized weakness tremors nausea vomiting. Thought to have alcohol withdrawal.  She was hospitalized for further management.   Assessment: Joann [name] keeps saying she needs to go home. She says she needs to be home to take care of her son. When I try to talk about the serious of her illness she repeats that she wants to go home. Yesterday she was more open to talking.  I called the Kelbi's husband Athelene [name] who is her primary Management consultant. He is currently on the road but hopes to be home this weekend. We discussed the seriousness of the patient's condition. He has been with her through several hospitalizations. He really wants Joann [name] to "stay alive." He says that Joann [name] is very intelligent and does her best when she is busy/working, when she is bored she begins drinking more. 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   Recommendations/Plan: Full Code/Full scope of care  Continue current care allowing time for outcomes  Open and honest conversations held with patients spouse, Joann [name] in the setting of Ahmia's cirrhosis and  alcoholism PMT will continue to follow  Goals of Care and Additional Recommendations: Limitations on Scope of Treatment: Full  Scope Treatment  Code Status:    Code Status Orders  (From admission, onward)           Start     Ordered   01/20/23 1749  Full code  Continuous       Question:  By:  Answer:  Consent: discussion documented in EHR   01/20/23 1753           Code Status History     Date Active Date Inactive Code Status Order ID Comments User Context   07/25/2022 0742 08/13/2022 2224 Full Code [id number]  Norton Blizzard, NP ED   06/19/2022 0050 07/03/2022 2324 Full Code [id number]  Briant Sites, DO ED   04/16/2022 0612 04/18/2022 0550 Full Code [id number]  Lurline Del, MD ED   02/16/2022 0324 02/25/2022 1806 Full Code [id number]  Carollee Herter, DO ED   02/15/2022 1527 02/16/2022 0324 Full Code [id number]  Gerhard Munch, MD ED   12/17/2021 2009 12/22/2021 2053 Full Code [id number]  Alicia Amel, MD ED   01/08/2020 1517 01/16/2020 1334 Partial Code [id number]  Charlton Amor, PA-C Inpatient   01/08/2020 1517 01/08/2020 1517 Full Code [id number]  Charlton Amor, PA-C Inpatient   01/02/2020 1213 01/08/2020 1502 Partial Code [id number]  Trevor Iha, PA-C Inpatient   12/24/2019 0902 01/02/2020 1213 Full Code [id number]  Jonah Nowland, MD ED   12/24/2019 0053 12/24/2019 0902 Full Code [id number]  McDonald, Mia A, PA-C ED   04/02/2019 1731 04/03/2019 2115 Full Code [id number]  Katha Hamming, MD Inpatient   04/02/2019 0906 04/02/2019 1731 Full Code [id number]  Concha Se, MD ED   10/24/2018 1459 10/26/2018 1554 Full Code [id number]  Jonah Criado, MD ED   08/28/2018 1324 09/01/2018 1353 Full Code [id number]  Lorenso Courier, MD ED   09/14/2017 0608 09/15/2017 1233 Full Code [id number]  Dione Booze, MD ED       Prognosis:  Unable to determine  Discharge Planning: To Be Determined  Care plan was discussed with attending provider Dr. Jerolyn Center.  Time spent: 40 minutes  Thank you for allowing the Palliative Medicine Team to assist in the care of this patient.   Sherryll Burger, NP  Please  contact Palliative Medicine Team phone at [phone] for questions and concerns.

## 2023-01-23 NOTE — Evaluation (Signed)
Occupational Therapy Evaluation Patient Details Name: Joann [name] MRN: [id number] DOB: 28-Jan-1981 Today's Date: 01/23/2023   History of Present Illness Pt is 42 year old presented to [facility] on  01/20/23 for alcohol withdrawal syndrome and acute hepatic encephalopathy. PMH - alcoholic cirrhosis, hepatic encephalopathy, etOH abuse, alcoholic pancreatitis and esophageal varices.   Clinical Impression   Pt is typically independent. Lives with her spouse who is a trunk driver and away from home frequently and 75 year old son. Pt presents with generalized weakness, decreased coordination, unsteady gait and impaired cognition. She require min assist with RW and +2 safety for OOB and up to moderate assist for ADLs. Pt with poor insight into deficits and is eager to go home. Do not anticipate pt will need post acute OT upon discharge.      Recommendations for follow up therapy are one component of a multi-disciplinary discharge planning process, led by the attending physician.  Recommendations may be updated based on patient status, additional functional criteria and insurance authorization.   Assistance Recommended at Discharge Frequent or constant Supervision/Assistance  Patient can return home with the following A little help with walking and/or transfers;A little help with bathing/dressing/bathroom;Assistance with cooking/housework;Direct supervision/assist for medications management;Direct supervision/assist for financial management;Assist for transportation;Help with stairs or ramp for entrance    Functional Status Assessment  Patient has had a recent decline in their functional status and demonstrates the ability to make significant improvements in function in a reasonable and predictable amount of time.  Equipment Recommendations  None recommended by OT    Recommendations for Other Services       Precautions / Restrictions Precautions Precautions: Fall Restrictions Weight Bearing  Restrictions: No      Mobility Bed Mobility Overal bed mobility: Needs Assistance Bed Mobility: Supine to Sit, Sit to Supine     Supine to sit: Supervision Sit to supine: Supervision   General bed mobility comments: For safety    Transfers Overall transfer level: Needs assistance Equipment used: 1 person hand held assist, 2 person hand held assist Transfers: Sit to/from Stand Sit to Stand: Min assist, +2 safety/equipment, +2 physical assistance           General transfer comment: Assist to power up and for stability. 2 person hand held from bed and 1 person from commode with grab bar. Pt with significant difficulty processing how to back up to commode to sit down.      Balance Overall balance assessment: Needs assistance   Sitting balance-Leahy Scale: Good     Standing balance support: No upper extremity supported, During functional activity Standing balance-Leahy Scale: Fair Standing balance comment: poor dynamic                           ADL either performed or assessed with clinical judgement   ADL Overall ADL's : Needs assistance/impaired Eating/Feeding: Set up;Bed level   Grooming: Oral care;Standing;Minimal assistance Grooming Details (indicate cue type and reason): decreased sequencing and problem solving with toothpaste use Upper Body Bathing: Minimal assistance;Sitting   Lower Body Bathing: Minimal assistance;Sit to/from stand   Upper Body Dressing : Minimal assistance;Sitting   Lower Body Dressing: Minimal assistance;Sit to/from stand   Toilet Transfer: Museum/gallery curator;Moderate assistance   Toileting- Clothing Manipulation and Hygiene: Moderate assistance;Sit to/from stand Toileting - Clothing Manipulation Details (indicate cue type and reason): assist for thoroughness     Functional mobility during ADLs: +2 for safety/equipment;Minimal assistance;Rolling walker (2 wheels)  Vision         Perception     Praxis       Pertinent Vitals/Pain Pain Assessment Pain Assessment: No/denies pain     Hand Dominance Right   Extremity/Trunk Assessment Upper Extremity Assessment Upper Extremity Assessment: RUE deficits/detail;LUE deficits/detail RUE Coordination: decreased fine motor LUE Coordination: decreased fine motor   Lower Extremity Assessment Lower Extremity Assessment: Defer to PT evaluation       Communication Communication Communication: No difficulties   Cognition Arousal/Alertness: Awake/alert Behavior During Therapy: Flat affect Overall Cognitive Status: Impaired/Different from baseline Area of Impairment: Attention, Memory, Following commands, Safety/judgement, Awareness, Problem solving                   Current Attention Level: Selective Memory: Decreased short-term memory, Decreased recall of precautions Following Commands: Follows one step commands with increased time Safety/Judgement: Decreased awareness of safety, Decreased awareness of deficits Awareness: Intellectual Problem Solving: Requires verbal cues, Slow processing, Decreased initiation, Requires tactile cues, Difficulty sequencing General Comments: Pt with poor insight into medical issues     General Comments  HR to 129    Exercises     Shoulder Instructions      Home Living Family/patient expects to be discharged to:: Private residence Living Arrangements: Spouse/significant other;Children (53 year old son) Available Help at Discharge: Family;Available PRN/intermittently Type of Home: House Home Access: Stairs to enter Entrance Stairs-Number of Steps: 4   Home Layout: Two level;Able to live on main level with bedroom/bathroom Alternate Level Stairs-Number of Steps: 6   Bathroom Shower/Tub: Producer, television/film/video: Standard     Home Equipment: None          Prior Functioning/Environment Prior Level of Function : Independent/Modified Independent                         OT Problem List: Decreased strength;Impaired balance (sitting and/or standing);Decreased coordination;Decreased cognition;Decreased safety awareness;Decreased knowledge of use of DME or AE      OT Treatment/Interventions: Self-care/ADL training;Therapeutic activities;Cognitive remediation/compensation;Patient/family education;Balance training;DME and/or AE instruction    OT Goals(Current goals can be found in the care plan section) Acute Rehab OT Goals OT Goal Formulation: With patient Time For Goal Achievement: 02/06/23 Potential to Achieve Goals: Good ADL Goals Pt Will Perform Grooming: Independently;standing Pt Will Perform Lower Body Bathing: Independently;sit to/from stand Pt Will Perform Lower Body Dressing: Independently;sit to/from stand Pt Will Transfer to Toilet: Independently;ambulating;regular height toilet Pt Will Perform Toileting - Clothing Manipulation and hygiene: Independently;sit to/from stand Pt Will Perform Tub/Shower Transfer: Tub transfer;with supervision;ambulating  OT Frequency: Min 2X/week    Co-evaluation   Reason for Co-Treatment: Necessary to address cognition/behavior during functional activity;For patient/therapist safety PT goals addressed during session: Mobility/safety with mobility        AM-PAC OT "6 Clicks" Daily Activity     Outcome Measure Help from another person eating meals?: None Help from another person taking care of personal grooming?: A Little Help from another person toileting, which includes using toliet, bedpan, or urinal?: A Lot Help from another person bathing (including washing, rinsing, drying)?: A Little Help from another person to put on and taking off regular upper body clothing?: A Little Help from another person to put on and taking off regular lower body clothing?: A Little 6 Click Score: 18   End of Session Equipment Utilized During Treatment: Rolling walker (2 wheels) Nurse Communication: Mobility status  Activity  Tolerance: Patient tolerated treatment well  Patient left: in bed;with call bell/phone within reach;with bed alarm set  OT Visit Diagnosis: Unsteadiness on feet (R26.81);Other abnormalities of gait and mobility (R26.89);Muscle weakness (generalized) (M62.81);Other symptoms and signs involving cognitive function                Time: 0865-7846 OT Time Calculation (min): 29 min Charges:  OT General Charges $OT Visit: 1 Visit OT Evaluation $OT Eval Moderate Complexity: 1 Mod  Berna Spare, OTR/L Acute Rehabilitation Services Office: [phone]   Evern Bio 01/23/2023, 11:37 AM

## 2023-01-23 NOTE — Plan of Care (Signed)

## 2023-01-24 ENCOUNTER — Other Ambulatory Visit (HOSPITAL_COMMUNITY): Payer: Self-pay

## 2023-01-24 ENCOUNTER — Encounter (HOSPITAL_COMMUNITY): Payer: Self-pay | Admitting: Internal Medicine

## 2023-01-24 DIAGNOSIS — F10931 Alcohol use, unspecified with withdrawal delirium: Secondary | ICD-10-CM | POA: Diagnosis not present

## 2023-01-24 LAB — COMPREHENSIVE METABOLIC PANEL
ALT: 13 U/L (ref 0–44)
AST: 91 U/L — ABNORMAL HIGH (ref 15–41)
Albumin: 1.6 g/dL — ABNORMAL LOW (ref 3.5–5.0)
Alkaline Phosphatase: 79 U/L (ref 38–126)
Anion gap: 4 — ABNORMAL LOW (ref 5–15)
BUN: <5 mg/dL — ABNORMAL LOW (ref 6–20)
CO2: 18 mmol/L — ABNORMAL LOW (ref 22–32)
Calcium: 7.5 mg/dL — ABNORMAL LOW (ref 8.9–10.3)
Chloride: 109 mmol/L (ref 98–111)
Creatinine, Ser: 0.74 mg/dL (ref 0.44–1.00)
GFR, Estimated: >60 mL/min (ref >60)
Glucose, Bld: 85 mg/dL (ref 70–99)
Potassium: 3.8 mmol/L (ref 3.5–5.1)
Sodium: 131 mmol/L — ABNORMAL LOW (ref 135–145)
Total Bilirubin: 5.8 mg/dL — ABNORMAL HIGH (ref 0.3–1.2)
Total Protein: 7 g/dL (ref 6.5–8.1)

## 2023-01-24 LAB — CBC WITH DIFFERENTIAL/PLATELET
Abs Immature Granulocytes: 0.01 10*3/uL (ref 0.00–0.07)
Basophils Absolute: 0 10*3/uL (ref 0.0–0.1)
Basophils Relative: 0 %
Eosinophils Absolute: 0 10*3/uL (ref 0.0–0.5)
Eosinophils Relative: 1 %
HCT: 23.9 % — ABNORMAL LOW (ref 36.0–46.0)
Hemoglobin: 7.4 g/dL — ABNORMAL LOW (ref 12.0–15.0)
Immature Granulocytes: 0 %
Lymphocytes Relative: 28 %
Lymphs Abs: 0.9 10*3/uL (ref 0.7–4.0)
MCH: 31.4 pg (ref 26.0–34.0)
MCHC: 31 g/dL (ref 30.0–36.0)
MCV: 101.3 fL — ABNORMAL HIGH (ref 80.0–100.0)
Monocytes Absolute: 0.4 10*3/uL (ref 0.1–1.0)
Monocytes Relative: 13 %
Neutro Abs: 1.8 10*3/uL (ref 1.7–7.7)
Neutrophils Relative %: 58 %
Platelets: 26 10*3/uL — CL (ref 150–400)
RBC: 2.36 MIL/uL — ABNORMAL LOW (ref 3.87–5.11)
RDW: 22.2 % — ABNORMAL HIGH (ref 11.5–15.5)
WBC: 3 10*3/uL — ABNORMAL LOW (ref 4.0–10.5)
nRBC: 0 % (ref 0.0–0.2)

## 2023-01-24 NOTE — Progress Notes (Signed)
Joann Redden, MD made aware of pt soft BP. Patient arousable and asymptomatic. No new orders at this time.

## 2023-01-24 NOTE — Progress Notes (Signed)
Occupational Therapy Treatment Patient Details Name: Joann [name] MRN: [id number] DOB: 12/17/1980 Today's Date: 01/24/2023   History of present illness Pt is 42 year old presented to [facility] on  01/20/23 for alcohol withdrawal syndrome and acute hepatic encephalopathy. PMH - alcoholic cirrhosis, hepatic encephalopathy, etOH abuse, alcoholic pancreatitis and esophageal varices.   OT comments  Pt up in chair, had just finished eating lunch. Unaware of soiled linen in chair. Hand held assist from recliner to toilet, moderate assistance to align her body with toilet prior to sitting and for thorough pericare. Used walker with moderate assistance from bathroom to sink for washing hands and to return to chair, again with difficulty navigating around furniture. Pt appearing angry with poor insight into deficits and recall as to why she has not been medically discharged yet. Pt will need 24 hour supervision upon initially returning home due to cognition and fall risk.   Recommendations for follow up therapy are one component of a multi-disciplinary discharge planning process, led by the attending physician.  Recommendations may be updated based on patient status, additional functional criteria and insurance authorization.    Assistance Recommended at Discharge Frequent or constant Supervision/Assistance  Patient can return home with the following  A little help with bathing/dressing/bathroom;Assistance with cooking/housework;Direct supervision/assist for medications management;Direct supervision/assist for financial management;Assist for transportation;Help with stairs or ramp for entrance;A lot of help with walking and/or transfers   Equipment Recommendations  None recommended by OT    Recommendations for Other Services      Precautions / Restrictions Precautions Precautions: Fall Restrictions Weight Bearing Restrictions: No       Mobility Bed Mobility               General bed mobility  comments: received in chair    Transfers Overall transfer level: Needs assistance Equipment used: 1 person hand held assist, Rolling walker (2 wheels) Transfers: Sit to/from Stand Sit to Stand: Min assist, Min guard           General transfer comment: min from toilet, min guard from recliner     Balance Overall balance assessment: Needs assistance   Sitting balance-Leahy Scale: Good     Standing balance support: No upper extremity supported, During functional activity Standing balance-Leahy Scale: Fair Standing balance comment: poor dynamic                           ADL either performed or assessed with clinical judgement   ADL Overall ADL's : Needs assistance/impaired Eating/Feeding: Independent;Sitting   Grooming: Wash/dry hands;Standing;Min guard                   Toilet Transfer: Minimal Holiday representative and Hygiene: Moderate assistance;Sit to/from stand       Functional mobility during ADLs: Rolling walker (2 wheels);Moderate assistance General ADL Comments: Pt with poor motor planning as she approaches sitting surfaces. Needs multimodal cues to align walker and her body up with toilet and with recliner.    Extremity/Trunk Assessment              Vision       Perception     Praxis      Cognition Arousal/Alertness: Awake/alert Behavior During Therapy: WFL for tasks assessed/performed Overall Cognitive Status: Impaired/Different from baseline Area of Impairment: Attention, Memory, Following commands, Safety/judgement, Awareness, Problem solving  Current Attention Level: Selective Memory: Decreased short-term memory, Decreased recall of precautions Following Commands: Follows one step commands with increased time Safety/Judgement: Decreased awareness of safety, Decreased awareness of deficits Awareness: Intellectual Problem Solving: Requires  verbal cues, Slow processing, Decreased initiation, Requires tactile cues, Difficulty sequencing General Comments: continues to be focus on wanting to go home, "you can't hold me here against my will!" Unaware of B/B incontinence        Exercises      Shoulder Instructions       General Comments      Pertinent Vitals/ Pain       Pain Assessment Pain Assessment: Faces Faces Pain Scale: No hurt  Home Living                                          Prior Functioning/Environment              Frequency  Min 2X/week        Progress Toward Goals  OT Goals(current goals can now be found in the care plan section)  Progress towards OT goals: Progressing toward goals  Acute Rehab OT Goals OT Goal Formulation: With patient Time For Goal Achievement: 02/06/23 Potential to Achieve Goals: Good  Plan Discharge plan remains appropriate    Co-evaluation                 AM-PAC OT "6 Clicks" Daily Activity     Outcome Measure   Help from another person eating meals?: None Help from another person taking care of personal grooming?: A Little Help from another person toileting, which includes using toliet, bedpan, or urinal?: A Lot Help from another person bathing (including washing, rinsing, drying)?: A Little Help from another person to put on and taking off regular upper body clothing?: A Little Help from another person to put on and taking off regular lower body clothing?: A Little 6 Click Score: 18    End of Session Equipment Utilized During Treatment: Gait belt;Rolling walker (2 wheels)  OT Visit Diagnosis: Unsteadiness on feet (R26.81);Other abnormalities of gait and mobility (R26.89);Muscle weakness (generalized) (M62.81);Other symptoms and signs involving cognitive function   Activity Tolerance Patient tolerated treatment well   Patient Left with call bell/phone within reach;in chair;with chair alarm set   Nurse Communication           Time: 1250-1310 OT Time Calculation (min): 20 min  Charges: OT General Charges $OT Visit: 1 Visit OT Treatments $Self Care/Home Management : 8-22 mins  Berna Spare, OTR/L Acute Rehabilitation Services Office: [phone]  Evern Bio 01/24/2023, 2:14 PM

## 2023-01-24 NOTE — Progress Notes (Addendum)
Daily Progress Note   Patient Name: Joann [name]       Date: 01/24/2023 DOB: 1981/01/04  Age: 42 y.o. MRN#: [id number] Attending Physician: Alwyn Ren, MD Primary Care Physician: Zigmund Daniel., MD Admit Date: 01/20/2023  Reason for Consultation/Follow-up: Establishing goals of care  Subjective: Patient was watching tv. She states she is comfortable. She is eager to go home.  Length of Stay: 4  Current Medications: Scheduled Meds:   Chlorhexidine Gluconate Cloth  6 each Topical Daily   folic acid  1 mg Oral Daily   lactulose  20 g Oral TID   LORazepam  0-4 mg Intravenous Q8H   multivitamin with minerals  1 tablet Oral Daily   pantoprazole  40 mg Oral BID   rifaximin  550 mg Oral BID   sodium chloride flush  10-40 mL Intracatheter Q12H   sucralfate  1 g Oral TID WC & HS   thiamine  100 mg Oral Daily   Or   thiamine  100 mg Intravenous Daily    Continuous Infusions:   PRN Meds: acetaminophen **OR** acetaminophen, ondansetron **OR** ondansetron (ZOFRAN) IV, mouth rinse, sodium chloride flush  Physical Exam Eyes:     Comments: jaundiced             Vital Signs: BP 104/67 (BP Location: Left Arm)   Pulse (!) 101   Temp 99.6 F (37.6 C) (Oral)   Resp 18   Ht 5\' 4"  (1.626 m)   Wt 63.5 kg   SpO2 100%   BMI 24.03 kg/m  SpO2: SpO2: 100 % O2 Device: O2 Device: Room Air O2 Flow Rate:    Intake/output summary:  Intake/Output Summary (Last 24 hours) at 01/24/2023 1043 Last data filed at 01/24/2023 0953 Gross per 24 hour  Intake 550.45 ml  Output 1700 ml  Net -1149.55 ml   LBM: Last BM Date : 01/23/23 Baseline Weight: Weight: 63.5 kg Most recent weight: Weight: 63.5 kg       Palliative Assessment/Data:50%      Patient Active Problem List    Diagnosis Date Noted   Alcohol withdrawal delirium, acute, hyperactive (HCC) 01/20/2023   Hypomagnesemia 01/20/2023   Acute hepatic encephalopathy (HCC) 01/20/2023   GI bleed 07/25/2022   Bleeding esophageal varices (HCC) 07/25/2022   AVM (arteriovenous malformation) of  small bowel, acquired with hemorrhage 07/25/2022   Esophageal varices in cirrhosis (HCC) 07/25/2022   Hematemesis 07/25/2022   Acute upper GI bleed 07/01/2022   Sepsis due to methicillin susceptible Staphylococcus aureus (MSSA) with acute organ dysfunction and septic shock (HCC) 07/01/2022   Lactic acidosis 07/01/2022   AKI (acute kidney injury) (HCC) 07/01/2022   Essential hypertension 07/01/2022   Ileus (HCC) 07/01/2022   Ulcer of esophagus without bleeding    Portal hypertensive gastropathy (HCC)    Alcohol withdrawal syndrome with complication (HCC)    Alcoholic hepatitis    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)    Abnormal LFTs    Alcoholic gastritis with bleeding 04/17/2022   ETOH abuse 04/16/2022   Electrolyte abnormality 02/24/2022   Leukopenia 02/24/2022   Coagulopathy (HCC) 02/24/2022   Severe protein-calorie malnutrition (HCC) 02/24/2022   Anxiety    Symptomatic anemia 12/17/2021   Esophageal varices without bleeding (HCC) 10/31/2021   Alcoholic cirrhosis (HCC) 12/31/2020   Reactive depression    Wernicke's encephalopathy    Transaminitis    Steroid-induced hyperglycemia    Leucocytosis    Macrocytic anemia    Sleep disturbance    Pressure injury of skin 01/05/2020   Alcoholic hepatitis with ascites    Acute respiratory failure [facility])    Palliative care encounter    Elevated bilirubin    Ascites    Advanced care planning/counseling discussion    Goals of care, counseling/discussion    Palliative care by specialist    Hypoalbuminemia    Encephalopathy, hepatic (HCC) 12/24/2019   Hypokalemia 04/02/2019   Alcohol dependence with withdrawal, unspecified (HCC) 10/24/2018    Thrombocytopenia (HCC) 10/24/2018   Alcohol abuse with alcohol-induced mood disorder (HCC) 09/14/2017    Palliative Care Assessment & Plan   Patient Profile: 42 y.o. female past medical history of alcohol abuse, alcoholic cirrhosis, DTs with seizures, hepatic encephalopathy, with multiple admissions to alcohol withdrawal, MRSA bacteremia, GI bleed  in 12/23, status post TIPS.  Presented with generalized weakness tremors nausea vomiting. Thought to have alcohol withdrawal.  She was hospitalized for further management.   Assessment: Aixa asks if she can go home today. I informed her that discharge would not be today. She stated that she understood and asked if her follow up would be outpatient. She states her PCP manages her liver disease and we discussed the importance of close follow up with them. We began talking about the seriousness of her liver disease and she closed her eyes not wanting to talk anymore. I told her I would reach out to her husband Harriett Sine.  Terrance and I discussed Atianna's upcoming discharge. He will not be able to be here tomorrow if she is discharged but their son will be picking her up. Harriett Sine says Sadeen will usually stay sober for a while after discharge. She has a new job starting soon which she will have to look forward to. He plans to encourage her to eat more and stay sober. He would like her to get assistance to stay sober and a therapist to discuss any reasons/traumas that have caused her to drink.  We discussed her code status and goals of care again. Wrigley is at high risk for further medical decline and has been hospitalized without paperwork to guide decision making. Harriett Sine states Yukari does not like to talk about this with him or anybody else. I encouraged them both to complete advanced directives. I encouraged him to call PMT with any questions or concerns.  Recommendations/Plan: Full Code/Full scope of care  Open and honest conversations held with patients  spouse, Harriett Sine in the setting of Amreen's cirrhosis and alcoholism PMT will continue to follow  Goals of Care and Additional Recommendations: Limitations on Scope of Treatment: Full Scope Treatment  Code Status:    Code Status Orders  (From admission, onward)           Start     Ordered   01/20/23 1749  Full code  Continuous       Question:  By:  Answer:  Consent: discussion documented in EHR   01/20/23 1753           Code Status History     Date Active Date Inactive Code Status Order ID Comments User Context   07/25/2022 0742 08/13/2022 2224 Full Code [id number]  Norton Blizzard, NP ED   06/19/2022 0050 07/03/2022 2324 Full Code [id number]  Briant Sites, DO ED   04/16/2022 0612 04/18/2022 0550 Full Code [id number]  Lurline Del, MD ED   02/16/2022 0324 02/25/2022 1806 Full Code [id number]  Carollee Herter, DO ED   02/15/2022 1527 02/16/2022 0324 Full Code [id number]  Gerhard Munch, MD ED   12/17/2021 2009 12/22/2021 2053 Full Code [id number]  Alicia Amel, MD ED   01/08/2020 1517 01/16/2020 1334 Partial Code [id number]  Charlton Amor, PA-C Inpatient   01/08/2020 1517 01/08/2020 1517 Full Code [id number]  Charlton Amor, PA-C Inpatient   01/02/2020 1213 01/08/2020 1502 Partial Code [id number]  Trevor Iha, PA-C Inpatient   12/24/2019 0902 01/02/2020 1213 Full Code [id number]  Jonah Deiss, MD ED   12/24/2019 0053 12/24/2019 0902 Full Code [id number]  McDonald, Mia A, PA-C ED   04/02/2019 1731 04/03/2019 2115 Full Code [id number]  Katha Hamming, MD Inpatient   04/02/2019 0906 04/02/2019 1731 Full Code [id number]  Concha Se, MD ED   10/24/2018 1459 10/26/2018 1554 Full Code [id number]  Jonah Melfi, MD ED   08/28/2018 1324 09/01/2018 1353 Full Code [id number]  Lorenso Courier, MD ED   09/14/2017 0608 09/15/2017 1233 Full Code [id number]  Dione Booze, MD ED       Prognosis:  Unable to determine  Discharge Planning: To Be Determined  Care plan was discussed with  bedside RN and Dr. Jerolyn Center  Time spent: 50 min  Thank you for allowing the Palliative Medicine Team to assist in the care of this patient.    Sherryll Burger, NP  Please contact Palliative Medicine Team phone at [phone] for questions and concerns.

## 2023-01-24 NOTE — Progress Notes (Signed)
Physical Therapy Treatment Patient Details Name: Joann [name] MRN: [id number] DOB: Jan 20, 1981 Today's Date: 01/24/2023   History of Present Illness Pt is 42 year old presented to [facility] on  01/20/23 for alcohol withdrawal syndrome and acute hepatic encephalopathy. PMH - alcoholic cirrhosis, hepatic encephalopathy, etOH abuse, alcoholic pancreatitis and esophageal varices.    PT Comments    Pt with improving mobility but cognition remains significantly impaired. Tremulous with gait requiring assist for stability. Expect pt's mobility to improve with resolution of withdrawals.   Recommendations for follow up therapy are one component of a multi-disciplinary discharge planning process, led by the attending physician.  Recommendations may be updated based on patient status, additional functional criteria and insurance authorization.  Follow Up Recommendations       Assistance Recommended at Discharge Frequent or constant Supervision/Assistance  Patient can return home with the following Direct supervision/assist for medications management;Direct supervision/assist for financial management;A little help with walking and/or transfers;Help with stairs or ramp for entrance;Assist for transportation;Assistance with cooking/housework   Equipment Recommendations  None recommended by PT    Recommendations for Other Services       Precautions / Restrictions Precautions Precautions: Fall Restrictions Weight Bearing Restrictions: No     Mobility  Bed Mobility Overal bed mobility: Modified Independent Bed Mobility: Supine to Sit     Supine to sit: Modified independent (Device/Increase time)          Transfers Overall transfer level: Needs assistance Equipment used: 1 person hand held assist Transfers: Sit to/from Stand Sit to Stand: Min assist, Min guard           General transfer comment: Min guard from bed and min assist from low commode. Difficulty with processing on/off  commode.    Ambulation/Gait Ambulation/Gait assistance: Min assist Gait Distance (Feet): 100 Feet Assistive device: 1 person hand held assist, Rolling walker (2 wheels), None Gait Pattern/deviations: Step-through pattern, Decreased step length - right, Decreased step length - left, Shuffle, Wide base of support Gait velocity: decr Gait velocity interpretation: 1.31 - 2.62 ft/sec, indicative of limited community ambulator   General Gait Details: Tremulous throughout. Min assist for balance with and without rollator. Running into objects with rollator   Stairs             Wheelchair Mobility    Modified Rankin (Stroke Patients Only)       Balance Overall balance assessment: Needs assistance Sitting-balance support: No upper extremity supported, Feet supported Sitting balance-Leahy Scale: Good     Standing balance support: No upper extremity supported, During functional activity Standing balance-Leahy Scale: Fair                              Cognition Arousal/Alertness: Awake/alert Behavior During Therapy: WFL for tasks assessed/performed Overall Cognitive Status: Impaired/Different from baseline Area of Impairment: Attention, Memory, Following commands, Safety/judgement, Awareness, Problem solving                   Current Attention Level: Selective Memory: Decreased short-term memory, Decreased recall of precautions Following Commands: Follows one step commands with increased time Safety/Judgement: Decreased awareness of safety, Decreased awareness of deficits Awareness: Intellectual Problem Solving: Requires verbal cues, Slow processing, Decreased initiation, Requires tactile cues, Difficulty sequencing General Comments: Pt with poor insight into medical deficits. Great difficulty figuring out how to turn and pull underwear down and sit on commode. Sits almost sideways.        Exercises  General Comments        Pertinent  Vitals/Pain      Home Living                          Prior Function            PT Goals (current goals can now be found in the care plan section) Acute Rehab PT Goals Patient Stated Goal: go home Progress towards PT goals: Progressing toward goals    Frequency    Min 1X/week      PT Plan Current plan remains appropriate    Co-evaluation              AM-PAC PT "6 Clicks" Mobility   Outcome Measure  Help needed turning from your back to your side while in a flat bed without using bedrails?: None Help needed moving from lying on your back to sitting on the side of a flat bed without using bedrails?: None Help needed moving to and from a bed to a chair (including a wheelchair)?: A Little Help needed standing up from a chair using your arms (e.g., wheelchair or bedside chair)?: A Little Help needed to walk in hospital room?: A Little Help needed climbing 3-5 steps with a railing? : A Little 6 Click Score: 20    End of Session Equipment Utilized During Treatment: Gait belt Activity Tolerance: Patient tolerated treatment well Patient left: with call bell/phone within reach;with chair alarm set;in chair Nurse Communication: Mobility status;Other (comment) (Left purewick off) PT Visit Diagnosis: Unsteadiness on feet (R26.81);Other abnormalities of gait and mobility (R26.89);Muscle weakness (generalized) (M62.81)     Time: 1610-9604 PT Time Calculation (min) (ACUTE ONLY): 28 min  Charges:  $Gait Training: 23-37 mins                     [facility] PT Acute Rehabilitation Services Office [phone]    Angelina [facility] 01/24/2023, 1:35 PM

## 2023-01-24 NOTE — Plan of Care (Signed)

## 2023-01-24 NOTE — Social Work (Signed)
CSW received consult to follow up on substance use resources/discussion with patient. CSW spoke with patient at bedside. Patient reports PTA she comes from home with her spouse and son. CSW offered patient outpatient substance use treatment services resources. Patient accepted. CSW informed patient that she can find additional resources on her AVS. All questions answered. No further questions reported at this time. Patient reports her spouse will pick her up when she is medically ready for dc.

## 2023-01-24 NOTE — Progress Notes (Signed)
Pt BP stabilized without intervention. MD made aware. Will continue to monitor.

## 2023-01-24 NOTE — Progress Notes (Signed)
PROGRESS NOTE    Joann [name]  ZOX:[id number] DOB: 05/28/1981 DOA: 01/20/2023 PCP: Zigmund Daniel., MD   Brief Narrative: : 42 y.o. 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  Presented with generalized weakness tremors nausea vomiting.  Admitted for alcohol withdrawal.    Assessment & Plan:   Principal Problem:   Alcohol withdrawal delirium, acute, hyperactive (HCC) Active Problems:   Alcohol withdrawal syndrome with complication (HCC)   Alcoholic cirrhosis (HCC)   Transaminitis   Hypokalemia   Hypomagnesemia   Acute hepatic encephalopathy (HCC)  Alcohol withdrawal syndrome-admitted with delirium started on CIWA protocol thiamine folic acid and multivitamins.   Acute hepatic encephalopathy-lactulose 133 from 110. Increase lactulose to 3 times daily.   Pancytopenia Secondary to bone marrow suppression from consuming excessive amounts of alcohol.  No evidence for overt bleeding.   Drop in hemoglobin and platelet counts noted.   She received a unit of platelet transfusion on 01/22/2023  Platelets 26 from  23 from 14 after platelet transfusion.  Hypokalemia and hypomagnesemia-resolved.  Hypotension she was started on IV fluids 01/22/2023.  Blood pressure has improved but still remains soft.  Continue IV fluids and monitor closely for fluid retention.  NSVT Tele shows NSVT. Likely due to electrolyte abnormalities. ECHO from 06/2022 shoed normal LVEF. Potassium 3.8 magnesium 1.7.  Will try to maintain it above 4 and 2 respectively.  Lactic acidosis-4.1 from 3.4 from 6.5 from 6.1 Trend lactic acid.   Bruising over left upper arm No obvious hematoma noted on examination.  Monitor for now.   Alcoholic liver cirrhosis/status post  TIPS Monitor closely.   Goals of care-appreciate palliative care input.  Patient is full code.  Estimated body mass index is 24.03 kg/m as calculated from the following:   Height as of this encounter: 5\' 4"  (1.626 m).   Weight as of this encounter: 63.5 kg.  DVT prophylaxis: none Code Status:full Family Communication:none at bedside Disposition Plan:  Status is: Inpatient Remains inpatient appropriate because: Alcohol withdrawal thrombocytopenia severe anemia pancytopenia   Consultants:  none  Procedures: none Antimicrobials:none  Subjective: Anxious to go home. Lactic acid 2.5 from 3.6 from 4.1 No events overnight  Objective: Vitals:   01/23/23 2050 01/24/23 0500 01/24/23 0545 01/24/23 0800  BP: 98/63 (!) 90/48  104/67  Pulse: 100 100 98 (!) 101  Resp: 18 17 18    Temp: 99.6 F (37.6 C) 99.6 F (37.6 C)    TempSrc: Oral Oral    SpO2: 98% 99% 100% 100%  Weight:      Height:        Intake/Output Summary (Last 24 hours) at 01/24/2023 0949 Last data filed at 01/24/2023 0500 Gross per 24 hour  Intake 540.45 ml  Output 1700 ml  Net -1159.55 ml    Filed Weights   01/20/23 1417  Weight: 63.5 kg    Examination:  General exam: Appears in no acute distress.  Chronically ill-appearing. Respiratory system: Diminished breath sounds  cardiovascular system: S1 & S2 heard, RRR. No JVD, murmurs, rubs, gallops or clicks. No pedal edema. Gastrointestinal system: Abdomen is nondistended, soft and nontender. No organomegaly or masses felt. Normal bowel sounds heard. Central nervous system-awake oriented to place Extremities: Left upper extremity bruises noted   Data Reviewed: I have personally reviewed following labs and  imaging studies  CBC: Recent Labs  Lab 01/20/23 1450 01/21/23 0247 01/22/23 0320 01/22/23 1105 01/23/23 0531 01/24/23 0450  WBC 3.8* 2.5* 1.8* 1.8* 2.3* 3.0*  NEUTROABS 2.9  --   --   --  1.4* 1.8  HGB 9.0* 8.9* 7.2* 7.7* 7.3* 7.4*  HCT 29.0*  28.0* 23.1* 25.2* 23.5* 23.9*  MCV 99.3 97.2 98.3 103.3* 99.2 101.3*  PLT 27* 18* 14* 22* 23* 26*    Basic Metabolic Panel: Recent Labs  Lab 01/20/23 1450 01/21/23 0247 01/22/23 0320 01/22/23 1105 01/23/23 0531 01/24/23 0450  NA 138 135 136  --  135 131*  K 2.5* 3.0* 4.8 3.5 3.8 3.8  CL 103 102 114*  --  111 109  CO2 20* 21* 17*  --  17* 18*  GLUCOSE 199* 121* 89  --  87 85  BUN <5* <5* <5*  --  <5* <5*  CREATININE 0.81 0.71 0.57  --  0.55 0.74  CALCIUM 7.3* 7.6* 6.7*  --  6.9* 7.5*  MG 1.1* 1.3* 1.3*  --  1.7  --     GFR: Estimated Creatinine Clearance: 78.3 mL/min (by C-G formula based on SCr of 0.74 mg/dL). Liver Function Tests: Recent Labs  Lab 01/20/23 1450 01/22/23 0320 01/23/23 0531 01/24/23 0450  AST 149* 128* 97* 91*  ALT 13 12 12 13   ALKPHOS 102 81 78 79  BILITOT 5.2* 6.8* 5.7* 5.8*  PROT 9.3* 6.8 6.7 7.0  ALBUMIN 2.4* 1.6* 1.6* 1.6*    No results for input(s): "LIPASE", "AMYLASE" in the last 168 hours. Recent Labs  Lab 01/20/23 1450 01/21/23 0601 01/22/23 0709 01/23/23 0531  AMMONIA 178* 130* 110* 133*    Coagulation Profile: No results for input(s): "INR", "PROTIME" in the last 168 hours. Cardiac Enzymes: No results for input(s): "CKTOTAL", "CKMB", "CKMBINDEX", "TROPONINI" in the last 168 hours. BNP (last 3 results) No results for input(s): "PROBNP" in the last 8760 hours. HbA1C: No results for input(s): "HGBA1C" in the last 72 hours. CBG: Recent Labs  Lab 01/20/23 1538  GLUCAP 225*    Lipid Profile: No results for input(s): "CHOL", "HDL", "LDLCALC", "TRIG", "CHOLHDL", "LDLDIRECT" in the last 72 hours. Thyroid Function Tests: No results for input(s): "TSH", "T4TOTAL", "FREET4", "T3FREE", "THYROIDAB" in the last 72 hours. Anemia Panel: No results for input(s): "VITAMINB12", "FOLATE", "FERRITIN", "TIBC", "IRON", "RETICCTPCT" in the last 72 hours. Sepsis Labs: Recent Labs  Lab 01/21/23 0601 01/22/23 1115 01/23/23 1130  01/23/23 1355  LATICACIDVEN 3.4* 4.1* 3.6* 2.5*     No results found for this or any previous visit (from the past 240 hour(s)).       Radiology Studies: No results found.      Scheduled Meds:  Chlorhexidine Gluconate Cloth  6 each Topical Daily   folic acid  1 mg Oral Daily   lactulose  20 g Oral TID   LORazepam  0-4 mg Intravenous Q8H   multivitamin with minerals  1 tablet Oral Daily   pantoprazole  40 mg Oral BID   rifaximin  550 mg Oral BID   sodium chloride flush  10-40 mL Intracatheter Q12H   sucralfate  1 g Oral TID WC & HS   thiamine  100 mg Oral Daily   Or   thiamine  100 mg Intravenous Daily   Continuous Infusions:     LOS: 4 days   Time spent:39 min  Alwyn Ren, MD  01/24/2023, 9:49 AM

## 2023-01-24 NOTE — Progress Notes (Signed)
Message sent to MD re: patient demanding to speak to a Doctor now-states she wants to go home. Await response.

## 2023-01-25 DIAGNOSIS — F10931 Alcohol use, unspecified with withdrawal delirium: Secondary | ICD-10-CM | POA: Diagnosis not present

## 2023-01-25 LAB — COMPREHENSIVE METABOLIC PANEL
ALT: 12 U/L (ref 0–44)
AST: 79 U/L — ABNORMAL HIGH (ref 15–41)
Albumin: 1.7 g/dL — ABNORMAL LOW (ref 3.5–5.0)
Alkaline Phosphatase: 79 U/L (ref 38–126)
Anion gap: 8 (ref 5–15)
BUN: <5 mg/dL — ABNORMAL LOW (ref 6–20)
CO2: 18 mmol/L — ABNORMAL LOW (ref 22–32)
Calcium: 7.4 mg/dL — ABNORMAL LOW (ref 8.9–10.3)
Chloride: 103 mmol/L (ref 98–111)
Creatinine, Ser: 0.64 mg/dL (ref 0.44–1.00)
GFR, Estimated: >60 mL/min (ref >60)
Glucose, Bld: 77 mg/dL (ref 70–99)
Potassium: 3.3 mmol/L — ABNORMAL LOW (ref 3.5–5.1)
Sodium: 129 mmol/L — ABNORMAL LOW (ref 135–145)
Total Bilirubin: 6.4 mg/dL — ABNORMAL HIGH (ref 0.3–1.2)
Total Protein: 7.2 g/dL (ref 6.5–8.1)

## 2023-01-25 LAB — CBC WITH DIFFERENTIAL/PLATELET
Abs Immature Granulocytes: 0.01 10*3/uL (ref 0.00–0.07)
Basophils Absolute: 0 10*3/uL (ref 0.0–0.1)
Basophils Relative: 0 %
Eosinophils Absolute: 0 10*3/uL (ref 0.0–0.5)
Eosinophils Relative: 1 %
HCT: 25.5 % — ABNORMAL LOW (ref 36.0–46.0)
Hemoglobin: 7.9 g/dL — ABNORMAL LOW (ref 12.0–15.0)
Immature Granulocytes: 0 %
Lymphocytes Relative: 34 %
Lymphs Abs: 1.2 10*3/uL (ref 0.7–4.0)
MCH: 31.1 pg (ref 26.0–34.0)
MCHC: 31 g/dL (ref 30.0–36.0)
MCV: 100.4 fL — ABNORMAL HIGH (ref 80.0–100.0)
Monocytes Absolute: 0.5 10*3/uL (ref 0.1–1.0)
Monocytes Relative: 14 %
Neutro Abs: 1.8 10*3/uL (ref 1.7–7.7)
Neutrophils Relative %: 51 %
Platelets: 25 10*3/uL — CL (ref 150–400)
RBC: 2.54 MIL/uL — ABNORMAL LOW (ref 3.87–5.11)
RDW: 23.3 % — ABNORMAL HIGH (ref 11.5–15.5)
WBC: 3.5 10*3/uL — ABNORMAL LOW (ref 4.0–10.5)
nRBC: 0 % (ref 0.0–0.2)

## 2023-01-25 LAB — AMMONIA: Ammonia: 142 umol/L — ABNORMAL HIGH (ref 9–35)

## 2023-01-25 MED ORDER — POTASSIUM CHLORIDE CRYS ER 20 MEQ PO TBCR: 40.0000 meq | EXTENDED_RELEASE_TABLET | Freq: Every day | ORAL | 0 refills | Status: DC

## 2023-01-25 MED ORDER — LACTULOSE 10 GM/15ML PO SOLN: 30.0000 g | Freq: Three times a day (TID) | ORAL | 0 refills | Status: DC

## 2023-01-25 MED ORDER — VITAMIN B-1 100 MG PO TABS: 100.0000 mg | ORAL_TABLET | Freq: Every day | ORAL | Status: DC

## 2023-01-25 MED ORDER — RIFAXIMIN 550 MG PO TABS: 550.0000 mg | ORAL_TABLET | Freq: Two times a day (BID) | ORAL | 0 refills | Status: DC

## 2023-01-25 MED ORDER — LACTULOSE 10 GM/15ML PO SOLN
30.0000 g | Freq: Three times a day (TID) | ORAL | Status: DC
  Administered 2023-01-25: 30 g via ORAL
  Filled 2023-01-25 (×2): qty 45

## 2023-01-25 MED ORDER — ONDANSETRON HCL 4 MG PO TABS: 4.0000 mg | ORAL_TABLET | Freq: Four times a day (QID) | ORAL | 0 refills | Status: DC | PRN

## 2023-01-25 MED ORDER — PANTOPRAZOLE SODIUM 40 MG PO TBEC: 40.0000 mg | DELAYED_RELEASE_TABLET | Freq: Two times a day (BID) | ORAL | 2 refills | Status: DC

## 2023-01-25 MED ORDER — POTASSIUM CHLORIDE CRYS ER 20 MEQ PO TBCR
40.0000 meq | EXTENDED_RELEASE_TABLET | Freq: Once | ORAL | Status: AC
  Administered 2023-01-25: 40 meq via ORAL
  Filled 2023-01-25: qty 2

## 2023-01-25 MED ORDER — FOLIC ACID 1 MG PO TABS: 1.0000 mg | ORAL_TABLET | Freq: Every day | ORAL | 1 refills | Status: DC

## 2023-01-25 NOTE — Discharge Summary (Signed)
Physician Discharge Summary  Joann [name] JXB:[id number] DOB: 1980/11/21 DOA: 01/20/2023  PCP: Zigmund Daniel., MD  Admit date: 01/20/2023 Discharge date: 01/25/2023  Admitted From: home Disposition: home Recommendations for Outpatient Follow-up:  Follow up with PCP in 1-2 weeks Please obtain BMP/CBC in one week Please follow up with gi  Home Health:no Equipment/Devices:none Discharge Condition:stable CODE STATUS: full Diet recommendation: cardiac Brief/Interim Summary:41 y.o. 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  Presented with generalized weakness tremors nausea vomiting.  Admitted for alcohol withdrawal.      Discharge Diagnoses:  Principal Problem:   Alcohol withdrawal delirium, acute, hyperactive (HCC) Active Problems:   Alcohol withdrawal syndrome with complication (HCC)   Alcoholic cirrhosis (HCC)   Transaminitis   Hypokalemia   Hypomagnesemia   Acute hepatic encephalopathy (HCC)  Alcohol withdrawal syndrome-admitted with delirium treated with CIWA protocol thiamine folic acid and multivitamins.   Acute hepatic encephalopathy-lactulose 142 from 133 from 110. Increased lactulose to 3 times daily. On dc lactulose 142.she was mentating normal.  She was adamant about being discharged home today.  Discussed with her husband.  I explained to her husband that her liver is failing and we are doing all have done everything we can think of keeping her on lactulose, rifaximin, she is status post TIPS.  In spite of all those above treatments her liver function has been gradually worsening.  I have discussed with him to consider palliative care follow-up with the primary care physician as well as changing her CODE STATUS to DNR which he is  thinking about.  She follows up at [facility] which the husband is going to arrange for that as an outpatient.   Pancytopenia Secondary to bone marrow suppression from consuming excessive amounts of alcohol.  No evidence for overt bleeding.   She received a unit of platelet transfusion on 01/22/2023  Platelets 25 from  23 from 14 after platelet transfusion. She  has chronic thrombocytopenia    Hypokalemia and hypomagnesemia-this was repleted and she was also discharged on potassium tablets.  Hypotension her blood pressure runs chronically low she was given IV fluids.    NSVT Tele shows NSVT. Likely due to electrolyte abnormalities. ECHO from 06/2022 shows normal LVEF.   Lactic acidosis- improved 2.5 from  3.6 from 4.1   Bruising over left upper arm No obvious hematoma noted on examination.    Alcoholic liver cirrhosis/status post TIPS   Goals of care-appreciate palliative care input.  Patient is full code    Estimated body mass index is 24.03 kg/m as calculated from the following:   Height as of this encounter: 5\' 4"  (1.626 m).   Weight as of this encounter: 63.5 kg.  Discharge Instructions  Discharge Instructions     Diet - low sodium heart healthy   Complete by: As directed    Increase activity slowly   Complete by: As directed       Allergies as of 01/25/2023       Reactions   Latex Hives, Other (See Comments)   BURNS THE SKIN   Tape Other (See Comments)   NO TAPES THAT CONTAIN LATEX- burns the skin        Medication List     STOP taking these medications    cephALEXin 500 MG capsule Commonly known as:  KEFLEX   chlordiazePOXIDE 25 MG capsule Commonly known as: LIBRIUM   disulfiram 250 MG tablet Commonly known as: Antabuse   ferrous sulfate 325 (65 FE) MG tablet   propranolol 20 MG tablet Commonly known as: INDERAL   sucralfate 1 g tablet Commonly known as: CARAFATE   traZODone 150 MG tablet Commonly known as: DESYREL       TAKE these  medications    folic acid 1 MG tablet Commonly known as: FOLVITE Take 1 tablet (1 mg total) by mouth daily.   lactulose 10 GM/15ML solution Commonly known as: CHRONULAC Take 45 mLs (30 g total) by mouth 3 (three) times daily. What changed: additional instructions   multivitamin with minerals Tabs tablet Take 1 tablet by mouth daily. What changed: when to take this   ondansetron 4 MG tablet Commonly known as: ZOFRAN Take 1 tablet (4 mg total) by mouth every 6 (six) hours as needed for nausea.   pantoprazole 40 MG tablet Commonly known as: PROTONIX Take 1 tablet (40 mg total) by mouth 2 (two) times daily. What changed: when to take this   potassium chloride SA 20 MEQ tablet Commonly known as: KLOR-CON M Take 2 tablets (40 mEq total) by mouth daily.   rifaximin 550 MG Tabs tablet Commonly known as: XIFAXAN Take 1 tablet (550 mg total) by mouth 2 (two) times daily.   thiamine 100 MG tablet Commonly known as: Vitamin B-1 Take 1 tablet (100 mg total) by mouth daily.        Follow-up Information     Alcoholics Anonymous. Call.   Contact information: [email or web address] [phone]        Zigmund Daniel., MD Follow up.   Specialty: Family Medicine Contact information: [address] [place] [address] [phone]         Mansouraty, Netty Starring., MD Follow up.   Specialties: Gastroenterology, Internal Medicine Contact information: [address] [place] [address] [phone]                Allergies  Allergen Reactions   Latex Hives and Other (See Comments)    BURNS THE SKIN   Tape Other (See Comments)    NO TAPES THAT CONTAIN LATEX- burns the skin    Consultations: none  Procedures/Studies: DG Chest Port 1 View  Result Date: 01/21/2023 CLINICAL DATA:  PICC line EXAM: PORTABLE CHEST 1 VIEW COMPARISON:  08/06/2022 FINDINGS: Right upper extremity central venous catheter tip to the cavoatrial region. Clear lung fields.  Normal cardiac size. No pneumothorax. IMPRESSION: Right upper extremity central venous catheter tip overlies the cavoatrial region. Electronically Signed   By: Jasmine Pang M.D.   On: 01/21/2023 17:15   [place] EKG SITE RITE  Result Date: 01/21/2023 If Site Rite image not attached, placement could not be confirmed due to current cardiac rhythm.  (Echo, Carotid, EGD, Colonoscopy, ERCP)    Subjective:shouting and screaming to go home  Discharge Exam: Vitals:   01/24/23 2033 01/25/23 0439  BP: 101/69 100/62  Pulse: 89 100  Resp: 18 19  Temp: 98.8 F (37.1 C) 98 F (36.7 C)  SpO2: 98% 100%   Vitals:   01/24/23 1645 01/24/23 1835 01/24/23 2033 01/25/23 0439  BP: 99/67 106/68 101/69 100/62  Pulse: 96  89 100  Resp: 20  18 19   Temp: 98.9 F (37.2 C)  98.8 F (37.1 C) 98 F (36.7 C)  TempSrc:   Axillary Axillary  SpO2: 100%  98% 100%  Weight:  Height:        General: Pt is alert, awake, not in acute distress Cardiovascular: RRR, S1/S2 +, no rubs, no gallops Respiratory: CTA bilaterally, no wheezing, no rhonchi Abdominal: Soft, NT, ND, bowel sounds + Extremities: no edema, no cyanosis  The results of significant diagnostics from this hospitalization (including imaging, microbiology, ancillary and laboratory) are listed below for reference.     Microbiology: No results found for this or any previous visit (from the past 240 hour(s)).   Labs: BNP (last 3 results) No results for input(s): "BNP" in the last 8760 hours. Basic Metabolic Panel: Recent Labs  Lab 01/20/23 1450 01/21/23 0247 01/22/23 0320 01/22/23 1105 01/23/23 0531 01/24/23 0450 01/25/23 0445  NA 138 135 136  --  135 131* 129*  K 2.5* 3.0* 4.8 3.5 3.8 3.8 3.3*  CL 103 102 114*  --  111 109 103  CO2 20* 21* 17*  --  17* 18* 18*  GLUCOSE 199* 121* 89  --  87 85 77  BUN <5* <5* <5*  --  <5* <5* <5*  CREATININE 0.81 0.71 0.57  --  0.55 0.74 0.64  CALCIUM 7.3* 7.6* 6.7*  --  6.9* 7.5* 7.4*  MG 1.1* 1.3*  1.3*  --  1.7  --   --    Liver Function Tests: Recent Labs  Lab 01/20/23 1450 01/22/23 0320 01/23/23 0531 01/24/23 0450 01/25/23 0445  AST 149* 128* 97* 91* 79*  ALT 13 12 12 13 12   ALKPHOS 102 81 78 79 79  BILITOT 5.2* 6.8* 5.7* 5.8* 6.4*  PROT 9.3* 6.8 6.7 7.0 7.2  ALBUMIN 2.4* 1.6* 1.6* 1.6* 1.7*   No results for input(s): "LIPASE", "AMYLASE" in the last 168 hours. Recent Labs  Lab 01/20/23 1450 01/21/23 0601 01/22/23 0709 01/23/23 0531 01/25/23 0445  AMMONIA 178* 130* 110* 133* 142*   CBC: Recent Labs  Lab 01/20/23 1450 01/21/23 0247 01/22/23 0320 01/22/23 1105 01/23/23 0531 01/24/23 0450 01/25/23 0445  WBC 3.8*   < > 1.8* 1.8* 2.3* 3.0* 3.5*  NEUTROABS 2.9  --   --   --  1.4* 1.8 1.8  HGB 9.0*   < > 7.2* 7.7* 7.3* 7.4* 7.9*  HCT 29.0*   < > 23.1* 25.2* 23.5* 23.9* 25.5*  MCV 99.3   < > 98.3 103.3* 99.2 101.3* 100.4*  PLT 27*   < > 14* 22* 23* 26* 25*   < > = values in this interval not displayed.   Cardiac Enzymes: No results for input(s): "CKTOTAL", "CKMB", "CKMBINDEX", "TROPONINI" in the last 168 hours. BNP: Invalid input(s): "POCBNP" CBG: Recent Labs  Lab 01/20/23 1538  GLUCAP 225*   D-Dimer No results for input(s): "DDIMER" in the last 72 hours. Hgb A1c No results for input(s): "HGBA1C" in the last 72 hours. Lipid Profile No results for input(s): "CHOL", "HDL", "LDLCALC", "TRIG", "CHOLHDL", "LDLDIRECT" in the last 72 hours. Thyroid function studies No results for input(s): "TSH", "T4TOTAL", "T3FREE", "THYROIDAB" in the last 72 hours.  Invalid input(s): "FREET3" Anemia work up No results for input(s): "VITAMINB12", "FOLATE", "FERRITIN", "TIBC", "IRON", "RETICCTPCT" in the last 72 hours. Urinalysis    Component Value Date/Time   COLORURINE AMBER (A) 10/31/2022 1550   APPEARANCEUR HAZY (A) 10/31/2022 1550   LABSPEC 1.010 10/31/2022 1550   PHURINE 6.0 10/31/2022 1550   GLUCOSEU NEGATIVE 10/31/2022 1550   HGBUR NEGATIVE 10/31/2022 1550    BILIRUBINUR MODERATE (A) 10/31/2022 1550   KETONESUR NEGATIVE 10/31/2022 1550   PROTEINUR 30 (A)  10/31/2022 1550   NITRITE NEGATIVE 10/31/2022 1550   LEUKOCYTESUR MODERATE (A) 10/31/2022 1550   Sepsis Labs Recent Labs  Lab 01/22/23 1105 01/23/23 0531 01/24/23 0450 01/25/23 0445  WBC 1.8* 2.3* 3.0* 3.5*   Microbiology No results found for this or any previous visit (from the past 240 hour(s)).   Time coordinating discharge:  34 minutes  SIGNED: Alwyn Ren, MD  [facility] 01/25/2023, 5:32 PM

## 2023-01-25 NOTE — Plan of Care (Signed)
  Problem: Education: Goal: Knowledge of General Education information will improve Description: Including pain rating scale, medication(s)/side effects and non-pharmacologic comfort measures 01/25/2023 1036 by Herma Carson, RN Outcome: Adequate for Discharge 01/25/2023 0820 by Herma Carson, RN Outcome: Progressing   Problem: Health Behavior/Discharge Planning: Goal: Ability to manage health-related needs will improve 01/25/2023 1036 by Herma Carson, RN Outcome: Adequate for Discharge 01/25/2023 0820 by Herma Carson, RN Outcome: Progressing   Problem: Clinical Measurements: Goal: Ability to maintain clinical measurements within normal limits will improve 01/25/2023 1036 by Herma Carson, RN Outcome: Adequate for Discharge 01/25/2023 0820 by Herma Carson, RN Outcome: Progressing Goal: Will remain free from infection 01/25/2023 1036 by Herma Carson, RN Outcome: Adequate for Discharge 01/25/2023 0820 by Herma Carson, RN Outcome: Progressing Goal: Diagnostic test results will improve 01/25/2023 1036 by Herma Carson, RN Outcome: Adequate for Discharge 01/25/2023 0820 by Herma Carson, RN Outcome: Progressing Goal: Respiratory complications will improve 01/25/2023 1036 by Herma Carson, RN Outcome: Adequate for Discharge 01/25/2023 0820 by Herma Carson, RN Outcome: Progressing Goal: Cardiovascular complication will be avoided 01/25/2023 1036 by Herma Carson, RN Outcome: Adequate for Discharge 01/25/2023 0820 by Herma Carson, RN Outcome: Progressing   Problem: Activity: Goal: Risk for activity intolerance will decrease 01/25/2023 1036 by Herma Carson, RN Outcome: Adequate for Discharge 01/25/2023 0820 by Herma Carson, RN Outcome: Progressing   Problem: Nutrition: Goal: Adequate nutrition will be maintained 01/25/2023 1036 by Herma Carson, RN Outcome: Adequate for Discharge 01/25/2023 0820 by Herma Carson, RN Outcome: Progressing    Problem: Coping: Goal: Level of anxiety will decrease 01/25/2023 1036 by Herma Carson, RN Outcome: Adequate for Discharge 01/25/2023 0820 by Herma Carson, RN Outcome: Progressing   Problem: Elimination: Goal: Will not experience complications related to bowel motility 01/25/2023 1036 by Herma Carson, RN Outcome: Adequate for Discharge 01/25/2023 0820 by Herma Carson, RN Outcome: Progressing Goal: Will not experience complications related to urinary retention 01/25/2023 1036 by Herma Carson, RN Outcome: Adequate for Discharge 01/25/2023 0820 by Herma Carson, RN Outcome: Progressing   Problem: Pain Managment: Goal: General experience of comfort will improve 01/25/2023 1036 by Herma Carson, RN Outcome: Adequate for Discharge 01/25/2023 0820 by Herma Carson, RN Outcome: Progressing   Problem: Safety: Goal: Ability to remain free from injury will improve 01/25/2023 1036 by Herma Carson, RN Outcome: Adequate for Discharge 01/25/2023 0820 by Herma Carson, RN Outcome: Progressing   Problem: Skin Integrity: Goal: Risk for impaired skin integrity will decrease 01/25/2023 1036 by Herma Carson, RN Outcome: Adequate for Discharge 01/25/2023 0820 by Herma Carson, RN Outcome: Progressing

## 2023-01-25 NOTE — Plan of Care (Signed)

## 2023-01-26 LAB — BPAM RBC
Blood Product Expiration Date: 202406132359
Blood Product Expiration Date: 202406132359
Unit Type and Rh: 5100

## 2023-01-26 LAB — TYPE AND SCREEN
ABO/RH(D): O POS
Unit division: 0

## 2023-01-30 ENCOUNTER — Other Ambulatory Visit: Payer: Self-pay

## 2023-01-30 ENCOUNTER — Encounter (HOSPITAL_COMMUNITY): Payer: Self-pay | Admitting: Internal Medicine

## 2023-01-30 ENCOUNTER — Inpatient Hospital Stay (HOSPITAL_COMMUNITY)
Admission: EM | Admit: 2023-01-30 | Discharge: 2023-02-01 | DRG: 641 | Disposition: A | Discharge status: Home / Self Care | Payer: Medicaid Other | Attending: Family Medicine | Admitting: Family Medicine

## 2023-01-30 DIAGNOSIS — R531 Weakness: Principal | ICD-10-CM

## 2023-01-30 DIAGNOSIS — R7401 Elevation of levels of liver transaminase levels: Secondary | ICD-10-CM | POA: Diagnosis present

## 2023-01-30 DIAGNOSIS — F1010 Alcohol abuse, uncomplicated: Secondary | ICD-10-CM | POA: Diagnosis present

## 2023-01-30 DIAGNOSIS — K7010 Alcoholic hepatitis without ascites: Secondary | ICD-10-CM | POA: Diagnosis present

## 2023-01-30 DIAGNOSIS — Z811 Family history of alcohol abuse and dependence: Secondary | ICD-10-CM

## 2023-01-30 DIAGNOSIS — E876 Hypokalemia: Principal | ICD-10-CM | POA: Diagnosis present

## 2023-01-30 DIAGNOSIS — D61818 Other pancytopenia: Secondary | ICD-10-CM | POA: Diagnosis present

## 2023-01-30 DIAGNOSIS — I10 Essential (primary) hypertension: Secondary | ICD-10-CM | POA: Diagnosis present

## 2023-01-30 DIAGNOSIS — K3189 Other diseases of stomach and duodenum: Secondary | ICD-10-CM | POA: Diagnosis present

## 2023-01-30 DIAGNOSIS — F419 Anxiety disorder, unspecified: Secondary | ICD-10-CM | POA: Diagnosis present

## 2023-01-30 DIAGNOSIS — K766 Portal hypertension: Secondary | ICD-10-CM | POA: Diagnosis present

## 2023-01-30 DIAGNOSIS — Z833 Family history of diabetes mellitus: Secondary | ICD-10-CM

## 2023-01-30 DIAGNOSIS — K7030 Alcoholic cirrhosis of liver without ascites: Secondary | ICD-10-CM | POA: Diagnosis present

## 2023-01-30 DIAGNOSIS — Z8249 Family history of ischemic heart disease and other diseases of the circulatory system: Secondary | ICD-10-CM

## 2023-01-30 DIAGNOSIS — F10239 Alcohol dependence with withdrawal, unspecified: Secondary | ICD-10-CM | POA: Diagnosis present

## 2023-01-30 LAB — COMPREHENSIVE METABOLIC PANEL
ALT: 11 U/L (ref 0–44)
AST: 67 U/L — ABNORMAL HIGH (ref 15–41)
Albumin: 2 g/dL — ABNORMAL LOW (ref 3.5–5.0)
Alkaline Phosphatase: 72 U/L (ref 38–126)
Anion gap: 8 (ref 5–15)
BUN: <5 mg/dL — ABNORMAL LOW (ref 6–20)
CO2: 21 mmol/L — ABNORMAL LOW (ref 22–32)
Calcium: 7.2 mg/dL — ABNORMAL LOW (ref 8.9–10.3)
Chloride: 109 mmol/L (ref 98–111)
Creatinine, Ser: 0.61 mg/dL (ref 0.44–1.00)
GFR, Estimated: >60 mL/min (ref >60)
Glucose, Bld: 106 mg/dL — ABNORMAL HIGH (ref 70–99)
Potassium: 2.5 mmol/L — CL (ref 3.5–5.1)
Sodium: 138 mmol/L (ref 135–145)
Total Bilirubin: 6.1 mg/dL — ABNORMAL HIGH (ref 0.3–1.2)
Total Protein: 7.7 g/dL (ref 6.5–8.1)

## 2023-01-30 LAB — CBC WITH DIFFERENTIAL/PLATELET
Abs Immature Granulocytes: 0.01 10*3/uL (ref 0.00–0.07)
Basophils Absolute: 0 10*3/uL (ref 0.0–0.1)
Basophils Relative: 1 %
Eosinophils Absolute: 0 10*3/uL (ref 0.0–0.5)
Eosinophils Relative: 1 %
HCT: 25.5 % — ABNORMAL LOW (ref 36.0–46.0)
Hemoglobin: 7.9 g/dL — ABNORMAL LOW (ref 12.0–15.0)
Immature Granulocytes: 1 %
Lymphocytes Relative: 30 %
Lymphs Abs: 0.6 10*3/uL — ABNORMAL LOW (ref 0.7–4.0)
MCH: 30.9 pg (ref 26.0–34.0)
MCHC: 31 g/dL (ref 30.0–36.0)
MCV: 99.6 fL (ref 80.0–100.0)
Monocytes Absolute: 0.2 10*3/uL (ref 0.1–1.0)
Monocytes Relative: 12 %
Neutro Abs: 1.1 10*3/uL — ABNORMAL LOW (ref 1.7–7.7)
Neutrophils Relative %: 55 %
Platelets: 33 10*3/uL — ABNORMAL LOW (ref 150–400)
RBC: 2.56 MIL/uL — ABNORMAL LOW (ref 3.87–5.11)
RDW: 23.1 % — ABNORMAL HIGH (ref 11.5–15.5)
WBC: 1.9 10*3/uL — ABNORMAL LOW (ref 4.0–10.5)
nRBC: 0 % (ref 0.0–0.2)

## 2023-01-30 LAB — RAPID URINE DRUG SCREEN, HOSP PERFORMED
Amphetamines: NOT DETECTED
Barbiturates: NOT DETECTED
Benzodiazepines: POSITIVE — AB
Cocaine: NOT DETECTED
Opiates: NOT DETECTED
Tetrahydrocannabinol: NOT DETECTED

## 2023-01-30 LAB — AMMONIA: Ammonia: 203 umol/L — ABNORMAL HIGH (ref 9–35)

## 2023-01-30 LAB — ETHANOL: Alcohol, Ethyl (B): <10 mg/dL (ref <10)

## 2023-01-30 LAB — I-STAT BETA HCG BLOOD, ED (MC, WL, AP ONLY): I-stat hCG, quantitative: <5 m[IU]/mL (ref <5)

## 2023-01-30 LAB — MAGNESIUM: Magnesium: 1.3 mg/dL — ABNORMAL LOW (ref 1.7–2.4)

## 2023-01-30 LAB — POTASSIUM: Potassium: 3 mmol/L — ABNORMAL LOW (ref 3.5–5.1)

## 2023-01-30 MED ORDER — TRAZODONE HCL 50 MG PO TABS: 25.0000 mg | ORAL_TABLET | Freq: Every evening | ORAL | Status: DC | PRN

## 2023-01-30 MED ORDER — RIFAXIMIN 550 MG PO TABS
550.0000 mg | ORAL_TABLET | Freq: Two times a day (BID) | ORAL | Status: DC
  Administered 2023-01-30 – 2023-02-01 (×4): 550 mg via ORAL
  Filled 2023-01-30 (×4): qty 1

## 2023-01-30 MED ORDER — IBUPROFEN 200 MG PO TABS: 400.0000 mg | ORAL_TABLET | Freq: Four times a day (QID) | ORAL | Status: DC | PRN

## 2023-01-30 MED ORDER — POTASSIUM CHLORIDE 10 MEQ/100ML IV SOLN
10.0000 meq | INTRAVENOUS | Status: AC
  Administered 2023-01-30 (×5): 10 meq via INTRAVENOUS
  Filled 2023-01-30 (×4): qty 100

## 2023-01-30 MED ORDER — POTASSIUM CHLORIDE CRYS ER 20 MEQ PO TBCR
40.0000 meq | EXTENDED_RELEASE_TABLET | Freq: Once | ORAL | Status: AC
  Administered 2023-01-30: 40 meq via ORAL
  Filled 2023-01-30: qty 2

## 2023-01-30 MED ORDER — LACTULOSE 10 GM/15ML PO SOLN
30.0000 g | Freq: Three times a day (TID) | ORAL | Status: DC
  Administered 2023-01-30 – 2023-02-01 (×6): 30 g via ORAL
  Filled 2023-01-30 (×4): qty 45
  Filled 2023-01-30: qty 60
  Filled 2023-01-30: qty 45

## 2023-01-30 MED ORDER — THIAMINE MONONITRATE 100 MG PO TABS
100.0000 mg | ORAL_TABLET | Freq: Every day | ORAL | Status: DC
  Administered 2023-01-30 – 2023-02-01 (×3): 100 mg via ORAL
  Filled 2023-01-30 (×3): qty 1

## 2023-01-30 MED ORDER — ALBUTEROL SULFATE (2.5 MG/3ML) 0.083% IN NEBU: 2.5000 mg | INHALATION_SOLUTION | RESPIRATORY_TRACT | Status: DC | PRN

## 2023-01-30 MED ORDER — LORAZEPAM 1 MG PO TABS: 1.0000 mg | ORAL_TABLET | ORAL | Status: DC | PRN

## 2023-01-30 MED ORDER — PANTOPRAZOLE SODIUM 40 MG PO TBEC
40.0000 mg | DELAYED_RELEASE_TABLET | Freq: Two times a day (BID) | ORAL | Status: DC
  Administered 2023-01-30 – 2023-02-01 (×4): 40 mg via ORAL
  Filled 2023-01-30 (×4): qty 1

## 2023-01-30 MED ORDER — MAGNESIUM SULFATE 2 GM/50ML IV SOLN
2.0000 g | Freq: Once | INTRAVENOUS | Status: AC
  Administered 2023-01-30: 2 g via INTRAVENOUS
  Filled 2023-01-30: qty 50

## 2023-01-30 MED ORDER — CALCIUM GLUCONATE-NACL 1-0.675 GM/50ML-% IV SOLN
1.0000 g | Freq: Once | INTRAVENOUS | Status: AC
  Administered 2023-01-30: 1000 mg via INTRAVENOUS
  Filled 2023-01-30: qty 50

## 2023-01-30 MED ORDER — LACTATED RINGERS IV BOLUS
500.0000 mL | Freq: Once | INTRAVENOUS | Status: AC
  Administered 2023-01-30: 500 mL via INTRAVENOUS

## 2023-01-30 MED ORDER — METOPROLOL TARTRATE 5 MG/5ML IV SOLN: 5.0000 mg | Freq: Four times a day (QID) | INTRAVENOUS | Status: DC | PRN

## 2023-01-30 MED ORDER — ADULT MULTIVITAMIN W/MINERALS CH
1.0000 | ORAL_TABLET | Freq: Every day | ORAL | Status: DC
  Administered 2023-01-31 – 2023-02-01 (×2): 1 via ORAL
  Filled 2023-01-30 (×2): qty 1

## 2023-01-30 MED ORDER — ONDANSETRON HCL 4 MG PO TABS: 4.0000 mg | ORAL_TABLET | Freq: Four times a day (QID) | ORAL | Status: DC | PRN

## 2023-01-30 MED ORDER — ONDANSETRON HCL 4 MG/2ML IJ SOLN: 4.0000 mg | Freq: Four times a day (QID) | INTRAMUSCULAR | Status: DC | PRN

## 2023-01-30 MED ORDER — SENNOSIDES-DOCUSATE SODIUM 8.6-50 MG PO TABS: 1.0000 | ORAL_TABLET | Freq: Every evening | ORAL | Status: DC | PRN

## 2023-01-30 MED ORDER — FOLIC ACID 1 MG PO TABS
1.0000 mg | ORAL_TABLET | Freq: Every day | ORAL | Status: DC
  Administered 2023-01-30 – 2023-02-01 (×3): 1 mg via ORAL
  Filled 2023-01-30 (×3): qty 1

## 2023-01-30 NOTE — ED Triage Notes (Signed)
EMS reports from home, Pt called for ETOH withdrawal. Pt denies pain and states last drink was 5/10. Recently seen at [facility] for same and Dc'd two days ago.   BP 99/60 HR 90 RR 16 Sp02 100 RA CBG 133  20 LAC NS enroute.

## 2023-01-30 NOTE — H&P (Signed)
History and Physical  MARIPAT OSANTOWSKI WUJ:[id number] DOB: 1980-11-25 DOA: 01/30/2023  PCP: Zigmund Daniel., MD   Chief Complaint: weakness, anxiety   HPI: Joann [name] is a 42 y.o. female with medical history significant for severe alcohol abuse with resultant alcoholic cirrhosis, DTs with seizures, hepatic encephalopathy, esophageal varices and GI bleeding, pancytopenia, who was recently at [facility] from 5/12 to 5/17 for acute alcohol withdrawal and now presents with weakness and anxiety, found to have significant hypokalemia and hypomagnesemia.  Patient endorses some anxiety, says that she has intermittent vomiting, no diarrhea, denies abdominal pain or any other complaints.  Says that she has been drinking alcohol since discharge from the hospital, just 1 beer a day.  She also later tells me that she does not drink any alcohol currently, told her nurse that she has not had any alcohol to drink since May 10.  Patient states that she has been unable to walk well or able unable to eat anything since discharge from the hospital she requires assistance from her family to ambulate.  Review of Systems: Please see HPI for pertinent positives and negatives. A complete 10 system review of systems are otherwise negative.  Past Medical History:  Diagnosis Date   [redacted] weeks gestation of pregnancy 03/22/2020   Alcohol abuse    Alcohol withdrawal delirium (HCC) 02/15/2022   Alcohol-induced acute pancreatitis    Alcoholic ketoacidosis 08/28/2018   Anxiety    DTs (delirium tremens) (HCC)    Elevated LFTs 10/24/2018   Encephalopathy acute    Hepatic encephalopathy (HCC) 12/24/2019   Hypertension    Hypokalemia 04/02/2019   Hypotension 02/24/2022   Liver disease    Past Surgical History:  Procedure Laterality Date   ECTOPIC PREGNANCY SURGERY     ESOPHAGEAL BANDING  07/25/2022   Procedure: ESOPHAGEAL BANDING;  Surgeon: Lemar Lofty., MD;  Location: [facility];  Service:  Gastroenterology;;   ESOPHAGEAL BANDING  08/30/2022   Procedure: ESOPHAGEAL BANDING;  Surgeon: Lemar Lofty., MD;  Location: [facility];  Service: Gastroenterology;;   ESOPHAGOGASTRODUODENOSCOPY (EGD) WITH PROPOFOL N/A 12/27/2019   Procedure: ESOPHAGOGASTRODUODENOSCOPY (EGD) WITH PROPOFOL;  Surgeon: Jeani Hawking, MD;  Location: [facility] ENDOSCOPY;  Service: Endoscopy;  Laterality: N/A;   ESOPHAGOGASTRODUODENOSCOPY (EGD) WITH PROPOFOL N/A 06/26/2022   Procedure: ESOPHAGOGASTRODUODENOSCOPY (EGD) WITH PROPOFOL;  Surgeon: Shellia Cleverly, DO;  Location: [facility] ENDOSCOPY;  Service: Gastroenterology;  Laterality: N/A;   ESOPHAGOGASTRODUODENOSCOPY (EGD) WITH PROPOFOL N/A 07/25/2022   Procedure: ESOPHAGOGASTRODUODENOSCOPY (EGD) WITH PROPOFOL;  Surgeon: Meridee Score Netty Starring., MD;  Location: [facility]OSCOPY;  Service: Gastroenterology;  Laterality: N/A;   ESOPHAGOGASTRODUODENOSCOPY (EGD) WITH PROPOFOL N/A 08/30/2022   Procedure: ESOPHAGOGASTRODUODENOSCOPY (EGD) WITH PROPOFOL;  Surgeon: Meridee Score Netty Starring., MD;  Location: [facility] ENDOSCOPY;  Service: Gastroenterology;  Laterality: N/A;   HEMOSTASIS CLIP PLACEMENT  07/25/2022   Procedure: HEMOSTASIS CLIP PLACEMENT;  Surgeon: Lemar Lofty., MD;  Location: [facility]COPY;  Service: Gastroenterology;;   HOT HEMOSTASIS N/A 07/25/2022   Procedure: HOT HEMOSTASIS (ARGON PLASMA COAGULATION/BICAP);  Surgeon: Lemar Lofty., MD;  Location: [facility];  Service: Gastroenterology;  Laterality: N/A;   IR RADIOLOGIST EVAL & MGMT  08/13/2022    Social History:  reports that she has never smoked. She has never used smokeless tobacco. She reports current alcohol use. She reports that she does not use drugs.   Allergies  Allergen Reactions   Latex Hives and Other (See Comments)    BURNS THE SKIN   Tape Other (See Comments)  NO TAPES THAT CONTAIN LATEX- burns the skin    Family History  Problem Relation Age of Onset   Healthy Mother     Alcohol abuse Mother        quit in her 53s   Alcohol abuse Father    Diabetes Father    Healthy Brother    Hypertension Maternal Grandmother    Hypertension Maternal Grandfather    Healthy Brother    Healthy Brother      Prior to Admission medications   Medication Sig Start Date End Date Taking? Authorizing Provider  folic acid (FOLVITE) 1 MG tablet Take 1 tablet (1 mg total) by mouth daily. 01/25/23  Yes Alwyn Ren, MD  lactulose (CHRONULAC) 10 GM/15ML solution Take 45 mLs (30 g total) by mouth 3 (three) times daily. 01/25/23  Yes Alwyn Ren, MD  Multiple Vitamin (MULTIVITAMIN WITH MINERALS) TABS tablet Take 1 tablet by mouth daily. Patient taking differently: Take 1 tablet by mouth daily with breakfast. 01/15/20  Yes Angiulli, Mcarthur Rossetti, PA-C  ondansetron (ZOFRAN) 4 MG tablet Take 1 tablet (4 mg total) by mouth every 6 (six) hours as needed for nausea. 01/25/23  Yes Alwyn Ren, MD  pantoprazole (PROTONIX) 40 MG tablet Take 1 tablet (40 mg total) by mouth 2 (two) times daily. 01/25/23  Yes Alwyn Ren, MD  potassium chloride SA (KLOR-CON M) 20 MEQ tablet Take 2 tablets (40 mEq total) by mouth daily. 01/25/23  Yes Alwyn Ren, MD  rifaximin (XIFAXAN) 550 MG TABS tablet Take 1 tablet (550 mg total) by mouth 2 (two) times daily. 01/25/23  Yes Alwyn Ren, MD  thiamine (VITAMIN B-1) 100 MG tablet Take 1 tablet (100 mg total) by mouth daily. 01/25/23  Yes Alwyn Ren, MD    Physical Exam: BP 112/72   Pulse 94   Temp 97.8 F (36.6 C) (Oral)   Resp 16   SpO2 99%   General: Patient appears her stated age, looks chronically ill, tired, has a tremor.  She is alert and oriented x 3. Eyes: EOMI, clear conjuctivae, white sclerea Neck: supple, no masses, trachea mildline  Cardiovascular: RRR, no murmurs or rubs, no peripheral edema  Respiratory: clear to auscultation bilaterally, no wheezes, no crackles  Abdomen: soft, nontender, obese.   No fluid wave.   Skin: dry, no rashes  Musculoskeletal: no joint effusions, normal range of motion, extremity muscle wasting Psychiatric: appropriate affect, normal speech  Neurologic: extraocular muscles intact, clear speech, moving all extremities with intact sensorium          Labs on Admission:  Basic Metabolic Panel: Recent Labs  Lab 01/24/23 0450 01/25/23 0445 01/30/23 0933  NA 131* 129* 138  K 3.8 3.3* 2.5*  CL 109 103 109  CO2 18* 18* 21*  GLUCOSE 85 77 106*  BUN <5* <5* <5*  CREATININE 0.74 0.64 0.61  CALCIUM 7.5* 7.4* 7.2*  MG  --   --  1.3*   Liver Function Tests: Recent Labs  Lab 01/24/23 0450 01/25/23 0445 01/30/23 0933  AST 91* 79* 67*  ALT 13 12 11   ALKPHOS 79 79 72  BILITOT 5.8* 6.4* 6.1*  PROT 7.0 7.2 7.7  ALBUMIN 1.6* 1.7* 2.0*   No results for input(s): "LIPASE", "AMYLASE" in the last 168 hours. Recent Labs  Lab 01/25/23 0445  AMMONIA 142*   CBC: Recent Labs  Lab 01/24/23 0450 01/25/23 0445 01/30/23 0933  WBC 3.0* 3.5* 1.9*  NEUTROABS 1.8 1.8 1.1*  HGB 7.4* 7.9* 7.9*  HCT 23.9* 25.5* 25.5*  MCV 101.3* 100.4* 99.6  PLT 26* 25* 33*   Cardiac Enzymes: No results for input(s): "CKTOTAL", "CKMB", "CKMBINDEX", "TROPONINI" in the last 168 hours.  BNP (last 3 results) No results for input(s): "BNP" in the last 8760 hours.  ProBNP (last 3 results) No results for input(s): "PROBNP" in the last 8760 hours.  CBG: No results for input(s): "GLUCAP" in the last 168 hours.  Radiological Exams on Admission: No results found.  Assessment/Plan Unfortunately this is a chronically ill 42 year old woman with a history of alcohol abuse and complications including cirrhosis, hepatic encephalopathy, GI bleeding, pancytopenia who is being admitted to the hospital with weakness and deconditioning as well as electrolyte abnormality.  She has hypokalemia and hypomagnesemia. -Observation admission -Telemetry due to electrolyte abnormality -Continue  magnesium and potassium supplementation as ordered by ER provider -Recheck potassium level this evening  Liver cirrhosis-no evidence of fulminant liver failure -Rifaximin, lactulose -Thiamine, folate, multivitamin   ETOH abuse-patient is confused, slightly tremulous; unclear history of alcohol ingestion, could be having some alcohol withdrawal though LFTs are improving overall, and negative alcohol level this morning -Will place on oral Ativan per CIWA protocol  Portal hypertensive gastropathy (HCC)-continue oral PPI  Pancytopenia-due to bone marrow suppression from alcohol abuse, stable   DVT prophylaxis: SCDs    Code Status: Full Code, would likely benefit from palliative care discussion regarding goals of care.  Consults called: None  Admission status: Observation  Time spent: 46 minutes  Riely Oetken Sharlette Dense MD [facility] Pager [phone]  If 7PM-7AM, please contact night-coverage [email or web address] Password [facility]  01/30/2023, 3:26 PM

## 2023-01-30 NOTE — ED Notes (Signed)
ED TO INPATIENT HANDOFF REPORT  ED Nurse Name and Phone #: Lona Kettle Name/Age/Gender Joann [name] 42 y.o. female Room/Bed: WA02/WA02  Code Status   Code Status: Full Code  Home/SNF/Other Home Patient oriented to: self, place, time, and situation Is this baseline? Yes   Triage Complete: Triage complete  Chief Complaint Weakness [R53.1]  Triage Note EMS reports from home, Pt called for ETOH withdrawal. Pt denies pain and states last drink was 5/10. Recently seen at [facility] for same and Dc'd two days ago.   BP 99/60 HR 90 RR 16 Sp02 100 RA CBG 133  20 LAC NS enroute.   Allergies Allergies  Allergen Reactions   Latex Hives and Other (See Comments)    BURNS THE SKIN   Tape Other (See Comments)    NO TAPES THAT CONTAIN LATEX- burns the skin    Level of Care/Admitting Diagnosis ED Disposition     ED Disposition  Admit   Condition  --   Comment  Hospital Area: [facility] [[id number]]  Level of Care: Telemetry [5]  Admit to tele based on following criteria: Monitor for Ischemic changes  May place patient in observation at [facility] or [facility] if equivalent level of care is available:: Yes  Covid Evaluation: Asymptomatic - no recent exposure (last 10 days) testing not required  Diagnosis: Weakness [241835]  Admitting Physician: Maryln Gottron [[id number]]  Attending Physician: Kirby Crigler, MIR [email or web address] [[id number]]          B Medical/Surgery History Past Medical History:  Diagnosis Date   [redacted] weeks gestation of pregnancy 03/22/2020   Alcohol abuse    Alcohol withdrawal delirium (HCC) 02/15/2022   Alcohol-induced acute pancreatitis    Alcoholic ketoacidosis 08/28/2018   Anxiety    DTs (delirium tremens) (HCC)    Elevated LFTs 10/24/2018   Encephalopathy acute    Hepatic encephalopathy (HCC) 12/24/2019   Hypertension    Hypokalemia 04/02/2019   Hypotension 02/24/2022   Liver disease    Past Surgical History:  Procedure Laterality Date    ECTOPIC PREGNANCY SURGERY     ESOPHAGEAL BANDING  07/25/2022   Procedure: ESOPHAGEAL BANDING;  Surgeon: Lemar Lofty., MD;  Location: [facility];  Service: Gastroenterology;;   ESOPHAGEAL BANDING  08/30/2022   Procedure: ESOPHAGEAL BANDING;  Surgeon: Lemar Lofty., MD;  Location: [facility];  Service: Gastroenterology;;   ESOPHAGOGASTRODUODENOSCOPY (EGD) WITH PROPOFOL N/A 12/27/2019   Procedure: ESOPHAGOGASTRODUODENOSCOPY (EGD) WITH PROPOFOL;  Surgeon: Jeani Hawking, MD;  Location: [facility] ENDOSCOPY;  Service: Endoscopy;  Laterality: N/A;   ESOPHAGOGASTRODUODENOSCOPY (EGD) WITH PROPOFOL N/A 06/26/2022   Procedure: ESOPHAGOGASTRODUODENOSCOPY (EGD) WITH PROPOFOL;  Surgeon: Shellia Cleverly, DO;  Location: [facility];  Service: Gastroenterology;  Laterality: N/A;   ESOPHAGOGASTRODUODENOSCOPY (EGD) WITH PROPOFOL N/A 07/25/2022   Procedure: ESOPHAGOGASTRODUODENOSCOPY (EGD) WITH PROPOFOL;  Surgeon: Meridee Score Netty Starring., MD;  Location: [facility];  Service: Gastroenterology;  Laterality: N/A;   ESOPHAGOGASTRODUODENOSCOPY (EGD) WITH PROPOFOL N/A 08/30/2022   Procedure: ESOPHAGOGASTRODUODENOSCOPY (EGD) WITH PROPOFOL;  Surgeon: Meridee Score Netty Starring., MD;  Location: [facility];  Service: Gastroenterology;  Laterality: N/A;   HEMOSTASIS CLIP PLACEMENT  07/25/2022   Procedure: HEMOSTASIS CLIP PLACEMENT;  Surgeon: Lemar Lofty., MD;  Location: [facility];  Service: Gastroenterology;;   HOT HEMOSTASIS N/A 07/25/2022   Procedure: HOT HEMOSTASIS (ARGON PLASMA COAGULATION/BICAP);  Surgeon: Lemar Lofty., MD;  Location: [facility];  Service: Gastroenterology;  Laterality: N/A;   IR RADIOLOGIST EVAL & MGMT  08/13/2022  A IV Location/Drains/Wounds Patient Lines/Drains/Airways Status     Active Line/Drains/Airways     Name Placement date Placement time Site Days   Peripheral IV 01/30/23 20 G Left Antecubital 01/30/23  0900  Antecubital  less than 1             Intake/Output Last 24 hours No intake or output data in the 24 hours ending 01/30/23 1539  Labs/Imaging Results for orders placed or performed during the hospital encounter of 01/30/23 (from the past 48 hour(s))  Comprehensive metabolic panel     Status: Abnormal   Collection Time: 01/30/23  9:33 AM  Result Value Ref Range   Sodium 138 135 - 145 mmol/L   Potassium 2.5 (LL) 3.5 - 5.1 mmol/L    Comment: CRITICAL RESULT CALLED TO, READ BACK BY AND VERIFIED WITH KISER C. RN @1036  01/30/23 MCLEAN K.    Chloride 109 98 - 111 mmol/L   CO2 21 (L) 22 - 32 mmol/L   Glucose, Bld 106 (H) 70 - 99 mg/dL    Comment: Glucose reference range applies only to samples taken after fasting for at least 8 hours.   BUN <5 (L) 6 - 20 mg/dL   Creatinine, Ser 1.02 0.44 - 1.00 mg/dL   Calcium 7.2 (L) 8.9 - 10.3 mg/dL   Total Protein 7.7 6.5 - 8.1 g/dL   Albumin 2.0 (L) 3.5 - 5.0 g/dL   AST 67 (H) 15 - 41 U/L   ALT 11 0 - 44 U/L   Alkaline Phosphatase 72 38 - 126 U/L   Total Bilirubin 6.1 (H) 0.3 - 1.2 mg/dL   GFR, Estimated >72 >53 mL/min    Comment: (NOTE) Calculated using the CKD-EPI Creatinine Equation (2021)    Anion gap 8 5 - 15    Comment: Performed at [facility], [address] [place], [place] [address]  Ethanol     Status: None   Collection Time: 01/30/23  9:33 AM  Result Value Ref Range   Alcohol, Ethyl (B) <10 <10 mg/dL    Comment: (NOTE) Lowest detectable limit for serum alcohol is 10 mg/dL.  For medical purposes only. Performed at [facility], [address] [place], [place] [address]   CBC with Diff     Status: Abnormal   Collection Time: 01/30/23  9:33 AM  Result Value Ref Range   WBC 1.9 (L) 4.0 - 10.5 K/uL   RBC 2.56 (L) 3.87 - 5.11 MIL/uL   Hemoglobin 7.9 (L) 12.0 - 15.0 g/dL   HCT 59.5 (L) 63.8 - 75.6 %   MCV 99.6 80.0 - 100.0 fL   MCH 30.9 26.0 - 34.0 pg   MCHC 31.0 30.0 - 36.0 g/dL   RDW 43.3 (H) 29.5 - 18.8 %    Platelets 33 (L) 150 - 400 K/uL    Comment: SPECIMEN CHECKED FOR CLOTS Immature Platelet Fraction may be clinically indicated, consider ordering this additional test [id number] REPEATED TO VERIFY PLATELET COUNT CONFIRMED BY SMEAR    nRBC 0.0 0.0 - 0.2 %   Neutrophils Relative % 55 %   Neutro Abs 1.1 (L) 1.7 - 7.7 K/uL   Lymphocytes Relative 30 %   Lymphs Abs 0.6 (L) 0.7 - 4.0 K/uL   Monocytes Relative 12 %   Monocytes Absolute 0.2 0.1 - 1.0 K/uL   Eosinophils Relative 1 %   Eosinophils Absolute 0.0 0.0 - 0.5 K/uL   Basophils Relative 1 %   Basophils Absolute 0.0 0.0 -  0.1 K/uL   Immature Granulocytes 1 %   Abs Immature Granulocytes 0.01 0.00 - 0.07 K/uL   Polychromasia PRESENT     Comment: Performed at [facility], [address], [place], [place] [address]  Magnesium     Status: Abnormal   Collection Time: 01/30/23  9:33 AM  Result Value Ref Range   Magnesium 1.3 (L) 1.7 - 2.4 mg/dL    Comment: Performed at [facility], [address], [place], [place] [address]  I-Stat beta hCG blood, ED     Status: None   Collection Time: 01/30/23  9:38 AM  Result Value Ref Range   I-stat hCG, quantitative <5.0 <5 mIU/mL   Comment 3            Comment:   GEST. AGE      CONC.  (mIU/mL)   <=1 WEEK        5 - 50     2 WEEKS       50 - 500     3 WEEKS       100 - 10,000     4 WEEKS     1,000 - 30,000        FEMALE AND NON-PREGNANT FEMALE:     LESS THAN 5 mIU/mL   Urine rapid drug screen (hosp performed)     Status: Abnormal   Collection Time: 01/30/23 11:58 AM  Result Value Ref Range   Opiates NONE DETECTED NONE DETECTED   Cocaine NONE DETECTED NONE DETECTED   Benzodiazepines POSITIVE (A) NONE DETECTED   Amphetamines NONE DETECTED NONE DETECTED   Tetrahydrocannabinol NONE DETECTED NONE DETECTED   Barbiturates NONE DETECTED NONE DETECTED    Comment: (NOTE) DRUG SCREEN FOR MEDICAL PURPOSES ONLY.  IF CONFIRMATION IS NEEDED FOR ANY PURPOSE, NOTIFY  LAB WITHIN 5 DAYS.  LOWEST DETECTABLE LIMITS FOR URINE DRUG SCREEN Drug Class                     Cutoff (ng/mL) Amphetamine and metabolites    1000 Barbiturate and metabolites    200 Benzodiazepine                 200 Opiates and metabolites        300 Cocaine and metabolites        300 THC                            50 Performed at [facility], [address] [place], [place] [address]    No results found.  Pending Labs Unresulted Labs (From admission, onward)     Start     Ordered   01/31/23 0500  Comprehensive metabolic panel  Tomorrow morning,   R        01/30/23 1523   01/31/23 0500  CBC  Tomorrow morning,   R        01/30/23 1523   01/30/23 2100  Potassium  Once,   STAT        01/30/23 1509   01/30/23 1510  Ammonia  Add-on,   AD        01/30/23 1509            Vitals/Pain Today's Vitals   01/30/23 1200 01/30/23 1257 01/30/23 1400 01/30/23 1430  BP: 105/82 122/68 113/79 112/72  Pulse: 99 86 89 94  Resp: 16 20 15 16   Temp:  97.8 F (  36.6 C)    TempSrc:  Oral    SpO2: 100% 100% 99% 99%  PainSc:        Isolation Precautions No active isolations  Medications Medications  potassium chloride 10 mEq in 100 mL IVPB (10 mEq Intravenous New Bag/Given 01/30/23 1508)  calcium gluconate 1 g/ 50 mL sodium chloride IVPB (has no administration in time range)  rifaximin (XIFAXAN) tablet 550 mg (has no administration in time range)  lactulose (CHRONULAC) 10 GM/15ML solution 30 g (has no administration in time range)  pantoprazole (PROTONIX) EC tablet 40 mg (has no administration in time range)  folic acid (FOLVITE) tablet 1 mg (has no administration in time range)  multivitamin with minerals tablet 1 tablet (has no administration in time range)  thiamine (VITAMIN B1) tablet 100 mg (has no administration in time range)  LORazepam (ATIVAN) tablet 1-4 mg (has no administration in time range)  ibuprofen (ADVIL) tablet 400 mg (has no administration  in time range)  traZODone (DESYREL) tablet 25 mg (has no administration in time range)  senna-docusate (Senokot-S) tablet 1 tablet (has no administration in time range)  ondansetron (ZOFRAN) tablet 4 mg (has no administration in time range)    Or  ondansetron (ZOFRAN) injection 4 mg (has no administration in time range)  albuterol (PROVENTIL) (2.5 MG/3ML) 0.083% nebulizer solution 2.5 mg (has no administration in time range)  metoprolol tartrate (LOPRESSOR) injection 5 mg (has no administration in time range)  lactated ringers bolus 500 mL (0 mLs Intravenous Stopped 01/30/23 1052)  potassium chloride SA (KLOR-CON M) CR tablet 40 mEq (40 mEq Oral Given 01/30/23 1103)  magnesium sulfate IVPB 2 g 50 mL (0 g Intravenous Stopped 01/30/23 1141)    Mobility walks     Focused Assessments ETOH problem   R Recommendations: See Admitting Provider Note  Report given to:   Additional Notes: .

## 2023-01-30 NOTE — ED Provider Notes (Signed)
[place] EMERGENCY DEPARTMENT AT [facility] Provider Note   CSN: [id number] Arrival date & time: 01/30/23  1308     History  Chief Complaint  Patient presents with   Alcohol Problem    TARAN ROEPER is a 42 y.o. female.   Alcohol Problem  Patient presents for anxiety.  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  She had a recent hospital admission for alcohol withdrawals.  She was discharged 5 days ago.  She states that, since time of discharge, she has resumed drinking alcohol.  She describes current alcohol consumption as 1 beer per day.  Last drink was 1 PM yesterday.  Patient arrives today via EMS.  She states that her husband called EMS due to concerns of the patient's anxiety.  Currently, patient endorses anxiety but denies any other symptoms.   History per mother: Since her recent discharge from hospital, patient has been not able to walk and unable to eat.  She has required 1-2 person assist to ambulate.     Home Medications Prior to Admission medications   Medication Sig Start Date End Date Taking? Authorizing Provider  folic acid (FOLVITE) 1 MG tablet Take 1 tablet (1 mg total) by mouth daily. 01/25/23  Yes Alwyn Ren, MD  lactulose (CHRONULAC) 10 GM/15ML solution Take 45 mLs (30 g total) by mouth 3 (three) times daily. 01/25/23  Yes Alwyn Ren, MD  Multiple Vitamin (MULTIVITAMIN WITH MINERALS) TABS tablet Take 1 tablet by mouth daily. Patient taking differently: Take 1 tablet by mouth daily with breakfast. 01/15/20  Yes Angiulli, Mcarthur Rossetti, PA-C  ondansetron (ZOFRAN) 4 MG tablet Take 1 tablet (4 mg total) by mouth every 6 (six) hours as needed for nausea. 01/25/23  Yes Alwyn Ren, MD  pantoprazole  (PROTONIX) 40 MG tablet Take 1 tablet (40 mg total) by mouth 2 (two) times daily. 01/25/23  Yes Alwyn Ren, MD  potassium chloride SA (KLOR-CON M) 20 MEQ tablet Take 2 tablets (40 mEq total) by mouth daily. 01/25/23  Yes Alwyn Ren, MD  rifaximin (XIFAXAN) 550 MG TABS tablet Take 1 tablet (550 mg total) by mouth 2 (two) times daily. 01/25/23  Yes Alwyn Ren, MD  thiamine (VITAMIN B-1) 100 MG tablet Take 1 tablet (100 mg total) by mouth daily. 01/25/23  Yes Alwyn Ren, MD      Allergies    Latex and Tape    Review of Systems   Review of Systems  Psychiatric/Behavioral:  The patient is nervous/anxious.   All other systems reviewed and are negative.   Physical Exam Updated Vital Signs BP 112/72   Pulse 94   Temp 97.8 F (36.6 C) (Oral)   Resp 16   SpO2 99%  Physical Exam Vitals and nursing note reviewed.  Constitutional:      General: She is not in acute distress.    Appearance: Normal appearance. She is well-developed. She is not ill-appearing, toxic-appearing or diaphoretic.  HENT:     Head: Normocephalic and atraumatic.     Right Ear: External ear normal.     Left Ear: External ear normal.     Nose: Nose normal.     Mouth/Throat:     Mouth: Mucous membranes are moist.  Eyes:     Extraocular Movements: Extraocular  movements intact.     Conjunctiva/sclera: Conjunctivae normal.  Cardiovascular:     Rate and Rhythm: Normal rate and regular rhythm.  Pulmonary:     Effort: Pulmonary effort is normal. No respiratory distress.  Abdominal:     General: There is no distension.     Palpations: Abdomen is soft.     Tenderness: There is no abdominal tenderness.  Musculoskeletal:        General: No swelling. Normal range of motion.     Cervical back: Normal range of motion and neck supple.     Right lower leg: No edema.     Left lower leg: No edema.  Skin:    General: Skin is warm and dry.     Capillary Refill: Capillary refill takes less  than 2 seconds.     Coloration: Skin is not jaundiced or pale.  Neurological:     General: No focal deficit present.     Mental Status: She is alert and oriented to person, place, and time.  Psychiatric:        Mood and Affect: Mood normal.        Speech: Speech is slurred.        Behavior: Behavior is slowed.     ED Results / Procedures / Treatments   Labs (all labs ordered are listed, but only abnormal results are displayed) Labs Reviewed  COMPREHENSIVE METABOLIC PANEL - Abnormal; Notable for the following components:      Result Value   Potassium 2.5 (*)    CO2 21 (*)    Glucose, Bld 106 (*)    BUN <5 (*)    Calcium 7.2 (*)    Albumin 2.0 (*)    AST 67 (*)    Total Bilirubin 6.1 (*)    All other components within normal limits  RAPID URINE DRUG SCREEN, HOSP PERFORMED - Abnormal; Notable for the following components:   Benzodiazepines POSITIVE (*)    All other components within normal limits  CBC WITH DIFFERENTIAL/PLATELET - Abnormal; Notable for the following components:   WBC 1.9 (*)    RBC 2.56 (*)    Hemoglobin 7.9 (*)    HCT 25.5 (*)    RDW 23.1 (*)    Platelets 33 (*)    Neutro Abs 1.1 (*)    Lymphs Abs 0.6 (*)    All other components within normal limits  MAGNESIUM - Abnormal; Notable for the following components:   Magnesium 1.3 (*)    All other components within normal limits  ETHANOL  AMMONIA  POTASSIUM  I-STAT BETA HCG BLOOD, ED ([facility], [facility], [facility] ONLY)    EKG EKG Interpretation  Date/Time:  Wednesday Jan 30 2023 11:38:06 EDT Ventricular Rate:  87 PR Interval:  140 QRS Duration: 107 QT Interval:  412 QTC Calculation: 496 R Axis:   -7 Text Interpretation: Sinus rhythm Borderline low voltage, extremity leads Consider anterior infarct Confirmed by Gloris Manchester ([id number]) on 01/30/2023 12:33:30 PM  Radiology No results found.  Procedures Procedures    Medications Ordered in ED Medications  potassium chloride 10 mEq in 100 mL IVPB (10 mEq Intravenous  New Bag/Given 01/30/23 1508)  calcium gluconate 1 g/ 50 mL sodium chloride IVPB (has no administration in time range)  rifaximin (XIFAXAN) tablet 550 mg (has no administration in time range)  lactulose (CHRONULAC) 10 GM/15ML solution 30 g (has no administration in time range)  pantoprazole (PROTONIX) EC tablet 40 mg (has no administration in time range)  folic acid (  FOLVITE) tablet 1 mg (has no administration in time range)  multivitamin with minerals tablet 1 tablet (has no administration in time range)  thiamine (VITAMIN B1) tablet 100 mg (has no administration in time range)  LORazepam (ATIVAN) tablet 1-4 mg (has no administration in time range)  ibuprofen (ADVIL) tablet 400 mg (has no administration in time range)  traZODone (DESYREL) tablet 25 mg (has no administration in time range)  senna-docusate (Senokot-S) tablet 1 tablet (has no administration in time range)  ondansetron (ZOFRAN) tablet 4 mg (has no administration in time range)    Or  ondansetron (ZOFRAN) injection 4 mg (has no administration in time range)  albuterol (PROVENTIL) (2.5 MG/3ML) 0.083% nebulizer solution 2.5 mg (has no administration in time range)  metoprolol tartrate (LOPRESSOR) injection 5 mg (has no administration in time range)  lactated ringers bolus 500 mL (0 mLs Intravenous Stopped 01/30/23 1052)  potassium chloride SA (KLOR-CON M) CR tablet 40 mEq (40 mEq Oral Given 01/30/23 1103)  magnesium sulfate IVPB 2 g 50 mL (0 g Intravenous Stopped 01/30/23 1141)    ED Course/ Medical Decision Making/ A&P                             Medical Decision Making Amount and/or Complexity of Data Reviewed Labs: ordered.  Risk Prescription drug management.   This patient presents to the ED for concern of generalized weakness, anxiety, this involves an extensive number of treatment options, and is a complaint that carries with it a high risk of complications and morbidity.  The differential diagnosis includes alcohol  withdrawal, deconditioning, malnutrition, progressive liver disease, dehydration, metabolic derangements, infection   Co morbidities that complicate the patient evaluation  alcohol abuse alcohol cirrhosis DTs with seizures, hepatic encephalopathy, with multiple admissions to alcohol withdrawal, MRSA bacteremia, GI bleed with EGD that revealed nonbleeding varices, she status post banding on 08/30/2022, also status post TIPS   Additional history obtained:  Additional history obtained from patient's mother External records from outside source obtained and reviewed including EMR   Lab Tests:  I Ordered, and personally interpreted labs.  The pertinent results include: Hypokalemia, hypocalcemia, and hypomagnesemia are present.  Baseline pancytopenia and thrombocytopenia present.  Cardiac Monitoring: / EKG:  The patient was maintained on a cardiac monitor.  I personally viewed and interpreted the cardiac monitored which showed an underlying rhythm of: Sinus rhythm   Problem List / ED Course / Critical interventions / Medication management  Patient presents for what she describes as anxiety.  She reports that her husband called EMS due to her anxiety.  On arrival, patient slowed and slurred speech.  She may be intoxicated.  She describes her alcohol consumption these days is minimal and states that her last drink was 1 PM yesterday.  Given her history of liver disease and alcohol abuse, will check lab work today.  Labwork is notable for hypokalemia, hypocalcemia, and hypomagnesemia.  Replacement electrolytes were ordered.  On reassessment, patient is companied bedside by her mother.  Mother reports that patient has been unable to walk and unable to eat.  She was encouraged to eat while in the ED.  Given this reported severe weakness and her current electrolyte deficiencies, patient to be admitted for further management. I ordered medication including IV fluids for hydration; magnesium sulfate,  potassium chloride, and calcium gluconate for electrolyte deficiencies Reevaluation of the patient after these medicines showed that the patient improved I have reviewed the patients  home medicines and have made adjustments as needed   Social Determinants of Health:  History of alcohol abuse        Final Clinical Impression(s) / ED Diagnoses Final diagnoses:  Generalized weakness  Hypokalemia  Hypomagnesemia  Hypocalcemia    Rx / DC Orders ED Discharge Orders     None         Gloris Manchester, MD 01/30/23 1525

## 2023-01-31 DIAGNOSIS — F419 Anxiety disorder, unspecified: Secondary | ICD-10-CM | POA: Diagnosis present

## 2023-01-31 DIAGNOSIS — K7030 Alcoholic cirrhosis of liver without ascites: Secondary | ICD-10-CM | POA: Diagnosis present

## 2023-01-31 DIAGNOSIS — K7010 Alcoholic hepatitis without ascites: Secondary | ICD-10-CM | POA: Diagnosis present

## 2023-01-31 DIAGNOSIS — Z833 Family history of diabetes mellitus: Secondary | ICD-10-CM | POA: Diagnosis not present

## 2023-01-31 DIAGNOSIS — D61818 Other pancytopenia: Secondary | ICD-10-CM | POA: Diagnosis present

## 2023-01-31 DIAGNOSIS — K766 Portal hypertension: Secondary | ICD-10-CM | POA: Diagnosis present

## 2023-01-31 DIAGNOSIS — F1010 Alcohol abuse, uncomplicated: Secondary | ICD-10-CM | POA: Diagnosis present

## 2023-01-31 DIAGNOSIS — E876 Hypokalemia: Secondary | ICD-10-CM | POA: Diagnosis not present

## 2023-01-31 DIAGNOSIS — I10 Essential (primary) hypertension: Secondary | ICD-10-CM | POA: Diagnosis present

## 2023-01-31 DIAGNOSIS — R531 Weakness: Secondary | ICD-10-CM | POA: Diagnosis not present

## 2023-01-31 DIAGNOSIS — Z8249 Family history of ischemic heart disease and other diseases of the circulatory system: Secondary | ICD-10-CM | POA: Diagnosis not present

## 2023-01-31 DIAGNOSIS — K3189 Other diseases of stomach and duodenum: Secondary | ICD-10-CM | POA: Diagnosis present

## 2023-01-31 DIAGNOSIS — Z811 Family history of alcohol abuse and dependence: Secondary | ICD-10-CM | POA: Diagnosis not present

## 2023-01-31 LAB — CBC
HCT: 25.2 % — ABNORMAL LOW (ref 36.0–46.0)
Hemoglobin: 7.7 g/dL — ABNORMAL LOW (ref 12.0–15.0)
MCH: 30.3 pg (ref 26.0–34.0)
MCHC: 30.6 g/dL (ref 30.0–36.0)
MCV: 99.2 fL (ref 80.0–100.0)
Platelets: 38 10*3/uL — ABNORMAL LOW (ref 150–400)
RBC: 2.54 MIL/uL — ABNORMAL LOW (ref 3.87–5.11)
RDW: 23.1 % — ABNORMAL HIGH (ref 11.5–15.5)
WBC: 2.5 10*3/uL — ABNORMAL LOW (ref 4.0–10.5)
nRBC: 0 % (ref 0.0–0.2)

## 2023-01-31 LAB — COMPREHENSIVE METABOLIC PANEL
ALT: 12 U/L (ref 0–44)
AST: 68 U/L — ABNORMAL HIGH (ref 15–41)
Albumin: 1.9 g/dL — ABNORMAL LOW (ref 3.5–5.0)
Alkaline Phosphatase: 68 U/L (ref 38–126)
Anion gap: 6 (ref 5–15)
BUN: <5 mg/dL — ABNORMAL LOW (ref 6–20)
CO2: 21 mmol/L — ABNORMAL LOW (ref 22–32)
Calcium: 7.8 mg/dL — ABNORMAL LOW (ref 8.9–10.3)
Chloride: 109 mmol/L (ref 98–111)
Creatinine, Ser: 0.62 mg/dL (ref 0.44–1.00)
GFR, Estimated: >60 mL/min (ref >60)
Glucose, Bld: 89 mg/dL (ref 70–99)
Potassium: 3 mmol/L — ABNORMAL LOW (ref 3.5–5.1)
Sodium: 136 mmol/L (ref 135–145)
Total Bilirubin: 5.4 mg/dL — ABNORMAL HIGH (ref 0.3–1.2)
Total Protein: 7.1 g/dL (ref 6.5–8.1)

## 2023-01-31 LAB — POTASSIUM: Potassium: 3.3 mmol/L — ABNORMAL LOW (ref 3.5–5.1)

## 2023-01-31 LAB — HEMOGLOBIN AND HEMATOCRIT, BLOOD
HCT: 28.5 % — ABNORMAL LOW (ref 36.0–46.0)
Hemoglobin: 8.7 g/dL — ABNORMAL LOW (ref 12.0–15.0)

## 2023-01-31 MED ORDER — POTASSIUM CHLORIDE 20 MEQ PO PACK
40.0000 meq | PACK | Freq: Once | ORAL | Status: AC
  Administered 2023-01-31: 40 meq via ORAL
  Filled 2023-01-31: qty 2

## 2023-01-31 NOTE — Progress Notes (Signed)
PROGRESS NOTE    COURTNEI GIORNO  ZOX:[id number] DOB: 1981-04-27 DOA: 01/30/2023 PCP: Zigmund Daniel., MD   Brief Narrative:  This 42 years old female with PMH significant for severe alcohol abuse with resultant alcoholic cirrhosis, DTs with seizures, hepatic encephalopathy, esophageal varices and GI bleeding, pancytopenia who was recently admitted to [facility] from 01/20/2023 to 01/25/2023 for acute alcohol withdrawal and now presented in the [facility] ED with generalized weakness and anxiety, She is found to have significant hypokalemia and hypomagnesemia.  Patient reports having intermittent vomiting, denies any diarrhea and abdominal pain.  Patient has been drinking alcohol since discharge from hospital,  states she has been drinking 1 beer per day.  Patient was unsteady while walking and unable to eat anything since discharge from hospital.  Patient is admitted for further evaluation.  Assessment & Plan:   Principal Problem:   Weakness Active Problems:   ETOH abuse   Transaminitis   Portal hypertensive gastropathy (HCC)   Alcohol dependence with withdrawal, unspecified (HCC)   Hypokalemia   Alcoholic hepatitis   Hypomagnesemia  Generalized weakness secondary to EtOH use: Patient with history of alcohol abuse and complications including cirrhosis, hepatic encephalopathy, GI bleeding, pancytopenia presented in the ED with generalized weakness and deconditioning as well as electrolyte abnormalities. Continue telemetry.  Replace electrolytes. PT and OT evaluation.  Liver cirrhosis: There is no evidence of fulminant liver failure. Continue rifaximin and lactulose. Continue thiamine, folate, multivitamins.  EtOH abuse: Patient is slightly tremulous, unclear history of alcohol ingestion. LFTs are improving.  EtOH level negative. Continue CIWA protocol with Ativan.  Portal hypertension: Continue oral pantoprazole.  Pancytopenia:  Due to bone marrow suppression from alcohol  abuse.  Remains stable.  Hypokalemia / Hypomagnesemia: Replaced.  Continue to monitor  DVT prophylaxis: SCDs Code Status: Full code Family Communication: No family at bed side. Disposition Plan:    Status is: Inpatient Remains inpatient appropriate because: Admitted for generalized weakness secondary to EtOH use.  Anticipated discharge home in 1 to 2 days.   Consultants:  None  Procedures: None  Antimicrobials:  Anti-infectives (From admission, onward)    Start     Dose/Rate Route Frequency Ordered Stop   01/30/23 2200  rifaximin (XIFAXAN) tablet 550 mg        550 mg Oral 2 times daily 01/30/23 1523         Subjective: Patient was seen and examined at bedside.  Overnight events noted.   Patient reports doing much better. She is alert and oriented , still has tremulousness.   Denies any abdominal pain, nausea and vomiting.  Objective: Vitals:   01/30/23 1658 01/30/23 2037 01/31/23 0131 01/31/23 0433  BP:  113/79 101/68 (!) 89/57  Pulse:  84 94 92  Resp:  16 16 18   Temp:  98.1 F (36.7 C) 98.3 F (36.8 C) 98.2 F (36.8 C)  TempSrc:  Oral Oral Oral  SpO2:   100% 98%  Weight: 71.7 kg     Height: 5\' 4"  (1.626 m)       Intake/Output Summary (Last 24 hours) at 01/31/2023 1232 Last data filed at 01/30/2023 1830 Gross per 24 hour  Intake 536.33 ml  Output --  Net 536.33 ml   Filed Weights   01/30/23 1658  Weight: 71.7 kg    Examination:  General exam: Appears calm and comfortable, deconditioned, not in any distress. Respiratory system: Clear to auscultation. Respiratory effort normal.  RR 15 Cardiovascular system: S1 & S2 heard, RRR.  No JVD, murmurs, rubs, gallops or clicks. No pedal edema. Gastrointestinal system: Abdomen is soft, non tender, non distended, BS+ Central nervous system: Alert and oriented x 3. No focal neurological deficits. Extremities: No edema, no cyanosis, no clubbing  Skin: No rashes, lesions or ulcers Psychiatry: Judgement and insight  appear normal. Mood & affect appropriate.     Data Reviewed: I have personally reviewed following labs and imaging studies  CBC: Recent Labs  Lab 01/25/23 0445 01/30/23 0933 01/31/23 0618  WBC 3.5* 1.9* 2.5*  NEUTROABS 1.8 1.1*  --   HGB 7.9* 7.9* 7.7*  HCT 25.5* 25.5* 25.2*  MCV 100.4* 99.6 99.2  PLT 25* 33* 38*   Basic Metabolic Panel: Recent Labs  Lab 01/25/23 0445 01/30/23 0933 01/30/23 2101 01/31/23 0618  NA 129* 138  --  136  K 3.3* 2.5* 3.0* 3.0*  CL 103 109  --  109  CO2 18* 21*  --  21*  GLUCOSE 77 106*  --  89  BUN <5* <5*  --  <5*  CREATININE 0.64 0.61  --  0.62  CALCIUM 7.4* 7.2*  --  7.8*  MG  --  1.3*  --   --    GFR: Estimated Creatinine Clearance: 88 mL/min (by C-G formula based on SCr of 0.62 mg/dL). Liver Function Tests: Recent Labs  Lab 01/25/23 0445 01/30/23 0933 01/31/23 0618  AST 79* 67* 68*  ALT 12 11 12   ALKPHOS 79 72 68  BILITOT 6.4* 6.1* 5.4*  PROT 7.2 7.7 7.1  ALBUMIN 1.7* 2.0* 1.9*   No results for input(s): "LIPASE", "AMYLASE" in the last 168 hours. Recent Labs  Lab 01/25/23 0445 01/30/23 1656  AMMONIA 142* 203*   Coagulation Profile: No results for input(s): "INR", "PROTIME" in the last 168 hours. Cardiac Enzymes: No results for input(s): "CKTOTAL", "CKMB", "CKMBINDEX", "TROPONINI" in the last 168 hours. BNP (last 3 results) No results for input(s): "PROBNP" in the last 8760 hours. HbA1C: No results for input(s): "HGBA1C" in the last 72 hours. CBG: No results for input(s): "GLUCAP" in the last 168 hours. Lipid Profile: No results for input(s): "CHOL", "HDL", "LDLCALC", "TRIG", "CHOLHDL", "LDLDIRECT" in the last 72 hours. Thyroid Function Tests: No results for input(s): "TSH", "T4TOTAL", "FREET4", "T3FREE", "THYROIDAB" in the last 72 hours. Anemia Panel: No results for input(s): "VITAMINB12", "FOLATE", "FERRITIN", "TIBC", "IRON", "RETICCTPCT" in the last 72 hours. Sepsis Labs: No results for input(s):  "PROCALCITON", "LATICACIDVEN" in the last 168 hours.  No results found for this or any previous visit (from the past 240 hour(s)).   Radiology Studies: No results found.  Scheduled Meds:  folic acid  1 mg Oral Daily   lactulose  30 g Oral TID   multivitamin with minerals  1 tablet Oral Q breakfast   pantoprazole  40 mg Oral BID   rifaximin  550 mg Oral BID   thiamine  100 mg Oral Daily   Continuous Infusions:   LOS: 0 days    Time spent: 50 mins    Willeen Niece, MD [facility]   If 7PM-7AM, please contact night-coverage

## 2023-02-01 DIAGNOSIS — R531 Weakness: Secondary | ICD-10-CM | POA: Diagnosis not present

## 2023-02-01 LAB — CBC
HCT: 26.7 % — ABNORMAL LOW (ref 36.0–46.0)
Hemoglobin: 8 g/dL — ABNORMAL LOW (ref 12.0–15.0)
MCH: 30.4 pg (ref 26.0–34.0)
MCHC: 30 g/dL (ref 30.0–36.0)
MCV: 101.5 fL — ABNORMAL HIGH (ref 80.0–100.0)
Platelets: 47 10*3/uL — ABNORMAL LOW (ref 150–400)
RBC: 2.63 MIL/uL — ABNORMAL LOW (ref 3.87–5.11)
RDW: 23.1 % — ABNORMAL HIGH (ref 11.5–15.5)
WBC: 3.8 10*3/uL — ABNORMAL LOW (ref 4.0–10.5)
nRBC: 0 % (ref 0.0–0.2)

## 2023-02-01 LAB — COMPREHENSIVE METABOLIC PANEL
ALT: 11 U/L (ref 0–44)
AST: 71 U/L — ABNORMAL HIGH (ref 15–41)
Albumin: 1.9 g/dL — ABNORMAL LOW (ref 3.5–5.0)
Alkaline Phosphatase: 73 U/L (ref 38–126)
Anion gap: 8 (ref 5–15)
BUN: <5 mg/dL — ABNORMAL LOW (ref 6–20)
CO2: 19 mmol/L — ABNORMAL LOW (ref 22–32)
Calcium: 7.5 mg/dL — ABNORMAL LOW (ref 8.9–10.3)
Chloride: 104 mmol/L (ref 98–111)
Creatinine, Ser: 0.72 mg/dL (ref 0.44–1.00)
GFR, Estimated: >60 mL/min (ref >60)
Glucose, Bld: 101 mg/dL — ABNORMAL HIGH (ref 70–99)
Potassium: 3.2 mmol/L — ABNORMAL LOW (ref 3.5–5.1)
Sodium: 131 mmol/L — ABNORMAL LOW (ref 135–145)
Total Bilirubin: 5.1 mg/dL — ABNORMAL HIGH (ref 0.3–1.2)
Total Protein: 7.3 g/dL (ref 6.5–8.1)

## 2023-02-01 LAB — PHOSPHORUS: Phosphorus: 2.9 mg/dL (ref 2.5–4.6)

## 2023-02-01 LAB — MAGNESIUM: Magnesium: 1.4 mg/dL — ABNORMAL LOW (ref 1.7–2.4)

## 2023-02-01 MED ORDER — POTASSIUM CHLORIDE 20 MEQ PO PACK
40.0000 meq | PACK | Freq: Once | ORAL | Status: AC
  Administered 2023-02-01: 40 meq via ORAL
  Filled 2023-02-01: qty 2

## 2023-02-01 MED ORDER — MAGNESIUM SULFATE 2 GM/50ML IV SOLN
2.0000 g | Freq: Once | INTRAVENOUS | Status: AC
  Administered 2023-02-01: 2 g via INTRAVENOUS
  Filled 2023-02-01: qty 50

## 2023-02-01 NOTE — Progress Notes (Signed)
Discharge papers explained to patient and her son, they verbalized understanding.

## 2023-02-01 NOTE — Discharge Instructions (Signed)
Advised to follow-up with primary care physician in 1 week. Advised to avoid drinking alcohol. Advised to continue rifaximin and lactulose regularly.

## 2023-02-01 NOTE — Discharge Summary (Signed)
Physician Discharge Summary  Joann [name] ZOX:[id number] DOB: 23-Apr-1981 DOA: 01/30/2023  PCP: Zigmund Daniel., MD  Admit date: 01/30/2023  Discharge date: 02/01/2023  Admitted From: Home.  Disposition:  Home  Recommendations for Outpatient Follow-up:  Follow up with PCP in 1-2 weeks. Please obtain BMP/CBC in one week. Advised to avoid drinking alcohol. Advised to continue rifaximin and lactulose regularly.  Home Health: None Equipment/Devices:None  Discharge Condition: Stable CODE STATUS:Full code Diet recommendation: Heart Healthy   Brief [facility] Course: This 42 years old female with PMH significant for severe alcohol abuse with resultant alcoholic cirrhosis, DTs with seizures, hepatic encephalopathy, esophageal varices and GI bleeding, pancytopenia who was recently admitted to [facility] from 01/20/2023 to 01/25/2023 for acute alcohol withdrawal and now presented in the [facility] ED with generalized weakness and anxiety, She is found to have significant hypokalemia and hypomagnesemia.  Patient reports having intermittent vomiting, denies any diarrhea and abdominal pain.  Patient has been drinking alcohol since discharge from hospital,  states she has been drinking 1 beer per day.  Patient was unsteady while walking and unable to eat anything since discharge from hospital.  Patient was admitted for further evaluation. She was continued on IV fluid resuscitation, electrolytes were replaced.  Patient has made significant improvement. She was continued on rifaximin and lactulose.  Her ammonia level has significantly down.  Patient feels better,  ambulated without any difficulty or support.  Patient feels better and wants to be discharged.  Patient being discharged home.  Discharge Diagnoses:  Principal Problem:   Weakness Active Problems:   ETOH abuse   Transaminitis   Portal hypertensive gastropathy (HCC)   Alcohol dependence with withdrawal, unspecified (HCC)    Hypokalemia   Alcoholic hepatitis   Hypomagnesemia  Generalized weakness secondary to EtOH use: Patient with history of alcohol abuse and complications including cirrhosis, hepatic encephalopathy, GI bleeding, pancytopenia presented in the ED with generalized weakness and deconditioning as well as electrolyte abnormalities. Continue telemetry.  Electrolyte replaced. Patient has ambulated well without support.   Liver cirrhosis: There is no evidence of fulminant liver failure. Continue rifaximin and lactulose. Continue thiamine, folate, multivitamins.   EtOH abuse: Patient was slightly tremulous, unclear history of alcohol ingestion. LFTs are improving.  EtOH level negative. Continue CIWA protocol with Ativan.   Portal hypertension: Continue oral pantoprazole.   Pancytopenia:  Due to bone marrow suppression from alcohol abuse.  Remains stable.   Hypokalemia / Hypomagnesemia: Replaced.  Continue to monitor   Discharge Instructions  Discharge Instructions     Call MD for:  difficulty breathing, headache or visual disturbances   Complete by: As directed    Call MD for:  persistant dizziness or light-headedness   Complete by: As directed    Call MD for:  persistant nausea and vomiting   Complete by: As directed    Diet - low sodium heart healthy   Complete by: As directed    Diet Carb Modified   Complete by: As directed    Discharge instructions   Complete by: As directed    Advised to follow-up with primary care physician in 1 week. Advised to avoid drinking alcohol. Advised to continue rifaximin and lactulose regularly.   Increase activity slowly   Complete by: As directed       Allergies as of 02/01/2023       Reactions   Latex Hives, Other (See Comments)   BURNS THE SKIN   Tape Other (See Comments)  NO TAPES THAT CONTAIN LATEX- burns the skin        Medication List     TAKE these medications    folic acid 1 MG tablet Commonly known as:  FOLVITE Take 1 tablet (1 mg total) by mouth daily.   lactulose 10 GM/15ML solution Commonly known as: CHRONULAC Take 45 mLs (30 g total) by mouth 3 (three) times daily.   multivitamin with minerals Tabs tablet Take 1 tablet by mouth daily. What changed: when to take this   ondansetron 4 MG tablet Commonly known as: ZOFRAN Take 1 tablet (4 mg total) by mouth every 6 (six) hours as needed for nausea.   pantoprazole 40 MG tablet Commonly known as: PROTONIX Take 1 tablet (40 mg total) by mouth 2 (two) times daily.   potassium chloride SA 20 MEQ tablet Commonly known as: KLOR-CON M Take 2 tablets (40 mEq total) by mouth daily.   rifaximin 550 MG Tabs tablet Commonly known as: XIFAXAN Take 1 tablet (550 mg total) by mouth 2 (two) times daily.   thiamine 100 MG tablet Commonly known as: Vitamin B-1 Take 1 tablet (100 mg total) by mouth daily.        Follow-up Information     Zigmund Daniel., MD Follow up in 1 week(s).   Specialty: Family Medicine Contact information: [address] [place] [address] [phone]                Allergies  Allergen Reactions   Latex Hives and Other (See Comments)    BURNS THE SKIN   Tape Other (See Comments)    NO TAPES THAT CONTAIN LATEX- burns the skin    Consultations: None   Procedures/Studies: DG Chest Port 1 View  Result Date: 01/21/2023 CLINICAL DATA:  PICC line EXAM: PORTABLE CHEST 1 VIEW COMPARISON:  08/06/2022 FINDINGS: Right upper extremity central venous catheter tip to the cavoatrial region. Clear lung fields. Normal cardiac size. No pneumothorax. IMPRESSION: Right upper extremity central venous catheter tip overlies the cavoatrial region. Electronically Signed   By: Jasmine Pang M.D.   On: 01/21/2023 17:15   [place] EKG SITE RITE  Result Date: 01/21/2023 If Site Rite image not attached, placement could not be confirmed due to current cardiac rhythm.     Subjective: Patient was seen and  examined at bedside.  Overnight events noted.   Patient reports doing much better.  She has ambulated without support.  She wants to be discharged.  Discharge Exam: Vitals:   01/31/23 2114 02/01/23 0648  BP: 111/69 104/64  Pulse: 93 91  Resp: 16 16  Temp: 98.6 F (37 C) 98.2 F (36.8 C)  SpO2: 98% 99%   Vitals:   01/31/23 1245 01/31/23 1745 01/31/23 2114 02/01/23 0648  BP: (!) 145/73 102/69 111/69 104/64  Pulse: 92 88 93 91  Resp: 18  16 16   Temp: 98.1 F (36.7 C) 99.2 F (37.3 C) 98.6 F (37 C) 98.2 F (36.8 C)  TempSrc: Oral Oral Oral Oral  SpO2: 100% 100% 98% 99%  Weight:      Height:        General: Pt is alert, awake, not in acute distress Cardiovascular: RRR, S1/S2 +, no rubs, no gallops Respiratory: CTA bilaterally, no wheezing, no rhonchi Abdominal: Soft, NT, ND, bowel sounds + Extremities: no edema, no cyanosis    The results of significant diagnostics from this hospitalization (including imaging, microbiology, ancillary and laboratory) are listed below for reference.  Microbiology: No results found for this or any previous visit (from the past 240 hour(s)).   Labs: BNP (last 3 results) No results for input(s): "BNP" in the last 8760 hours. Basic Metabolic Panel: Recent Labs  Lab 01/30/23 0933 01/30/23 2101 01/31/23 0618 01/31/23 1351 02/01/23 0555  NA 138  --  136  --  131*  K 2.5* 3.0* 3.0* 3.3* 3.2*  CL 109  --  109  --  104  CO2 21*  --  21*  --  19*  GLUCOSE 106*  --  89  --  101*  BUN <5*  --  <5*  --  <5*  CREATININE 0.61  --  0.62  --  0.72  CALCIUM 7.2*  --  7.8*  --  7.5*  MG 1.3*  --   --   --  1.4*  PHOS  --   --   --   --  2.9   Liver Function Tests: Recent Labs  Lab 01/30/23 0933 01/31/23 0618 02/01/23 0555  AST 67* 68* 71*  ALT 11 12 11   ALKPHOS 72 68 73  BILITOT 6.1* 5.4* 5.1*  PROT 7.7 7.1 7.3  ALBUMIN 2.0* 1.9* 1.9*   No results for input(s): "LIPASE", "AMYLASE" in the last 168 hours. Recent Labs  Lab  01/30/23 1656  AMMONIA 203*   CBC: Recent Labs  Lab 01/30/23 0933 01/31/23 0618 01/31/23 1301 02/01/23 0555  WBC 1.9* 2.5*  --  3.8*  NEUTROABS 1.1*  --   --   --   HGB 7.9* 7.7* 8.7* 8.0*  HCT 25.5* 25.2* 28.5* 26.7*  MCV 99.6 99.2  --  101.5*  PLT 33* 38*  --  47*   Cardiac Enzymes: No results for input(s): "CKTOTAL", "CKMB", "CKMBINDEX", "TROPONINI" in the last 168 hours. BNP: Invalid input(s): "POCBNP" CBG: No results for input(s): "GLUCAP" in the last 168 hours. D-Dimer No results for input(s): "DDIMER" in the last 72 hours. Hgb A1c No results for input(s): "HGBA1C" in the last 72 hours. Lipid Profile No results for input(s): "CHOL", "HDL", "LDLCALC", "TRIG", "CHOLHDL", "LDLDIRECT" in the last 72 hours. Thyroid function studies No results for input(s): "TSH", "T4TOTAL", "T3FREE", "THYROIDAB" in the last 72 hours.  Invalid input(s): "FREET3" Anemia work up No results for input(s): "VITAMINB12", "FOLATE", "FERRITIN", "TIBC", "IRON", "RETICCTPCT" in the last 72 hours. Urinalysis    Component Value Date/Time   COLORURINE AMBER (A) 10/31/2022 1550   APPEARANCEUR HAZY (A) 10/31/2022 1550   LABSPEC 1.010 10/31/2022 1550   PHURINE 6.0 10/31/2022 1550   GLUCOSEU NEGATIVE 10/31/2022 1550   HGBUR NEGATIVE 10/31/2022 1550   BILIRUBINUR MODERATE (A) 10/31/2022 1550   KETONESUR NEGATIVE 10/31/2022 1550   PROTEINUR 30 (A) 10/31/2022 1550   NITRITE NEGATIVE 10/31/2022 1550   LEUKOCYTESUR MODERATE (A) 10/31/2022 1550   Sepsis Labs Recent Labs  Lab 01/30/23 0933 01/31/23 0618 02/01/23 0555  WBC 1.9* 2.5* 3.8*   Microbiology No results found for this or any previous visit (from the past 240 hour(s)).   Time coordinating discharge: Over 30 minutes  SIGNED:   Willeen Niece, MD  [facility] 02/01/2023, 2:29 PM Pager   If 7PM-7AM, please contact night-coverage

## 2023-03-09 ENCOUNTER — Emergency Department (HOSPITAL_COMMUNITY): Payer: Medicaid Other

## 2023-03-09 ENCOUNTER — Other Ambulatory Visit: Payer: Self-pay

## 2023-03-09 ENCOUNTER — Inpatient Hospital Stay (HOSPITAL_COMMUNITY)
Admission: EM | Admit: 2023-03-09 | Discharge: 2023-04-11 | DRG: 441 | Disposition: E | Payer: Medicaid Other | Attending: Internal Medicine | Admitting: Internal Medicine

## 2023-03-09 DIAGNOSIS — Z833 Family history of diabetes mellitus: Secondary | ICD-10-CM

## 2023-03-09 DIAGNOSIS — F419 Anxiety disorder, unspecified: Secondary | ICD-10-CM | POA: Diagnosis present

## 2023-03-09 DIAGNOSIS — K7682 Hepatic encephalopathy: Principal | ICD-10-CM | POA: Diagnosis present

## 2023-03-09 DIAGNOSIS — K861 Other chronic pancreatitis: Secondary | ICD-10-CM | POA: Diagnosis present

## 2023-03-09 DIAGNOSIS — D6959 Other secondary thrombocytopenia: Secondary | ICD-10-CM | POA: Diagnosis present

## 2023-03-09 DIAGNOSIS — E039 Hypothyroidism, unspecified: Secondary | ICD-10-CM | POA: Diagnosis present

## 2023-03-09 DIAGNOSIS — R4589 Other symptoms and signs involving emotional state: Secondary | ICD-10-CM

## 2023-03-09 DIAGNOSIS — Z515 Encounter for palliative care: Secondary | ICD-10-CM

## 2023-03-09 DIAGNOSIS — K760 Fatty (change of) liver, not elsewhere classified: Secondary | ICD-10-CM | POA: Diagnosis present

## 2023-03-09 DIAGNOSIS — K529 Noninfective gastroenteritis and colitis, unspecified: Secondary | ICD-10-CM | POA: Diagnosis present

## 2023-03-09 DIAGNOSIS — D696 Thrombocytopenia, unspecified: Secondary | ICD-10-CM | POA: Diagnosis present

## 2023-03-09 DIAGNOSIS — Z7189 Other specified counseling: Secondary | ICD-10-CM

## 2023-03-09 DIAGNOSIS — I959 Hypotension, unspecified: Secondary | ICD-10-CM | POA: Diagnosis present

## 2023-03-09 DIAGNOSIS — N179 Acute kidney failure, unspecified: Secondary | ICD-10-CM | POA: Diagnosis present

## 2023-03-09 DIAGNOSIS — E8809 Other disorders of plasma-protein metabolism, not elsewhere classified: Secondary | ICD-10-CM | POA: Diagnosis present

## 2023-03-09 DIAGNOSIS — E870 Hyperosmolality and hypernatremia: Secondary | ICD-10-CM | POA: Diagnosis not present

## 2023-03-09 DIAGNOSIS — G9341 Metabolic encephalopathy: Secondary | ICD-10-CM | POA: Diagnosis present

## 2023-03-09 DIAGNOSIS — D62 Acute posthemorrhagic anemia: Secondary | ICD-10-CM | POA: Diagnosis present

## 2023-03-09 DIAGNOSIS — R58 Hemorrhage, not elsewhere classified: Secondary | ICD-10-CM | POA: Diagnosis present

## 2023-03-09 DIAGNOSIS — N12 Tubulo-interstitial nephritis, not specified as acute or chronic: Secondary | ICD-10-CM | POA: Diagnosis present

## 2023-03-09 DIAGNOSIS — F10239 Alcohol dependence with withdrawal, unspecified: Secondary | ICD-10-CM | POA: Diagnosis present

## 2023-03-09 DIAGNOSIS — E872 Acidosis, unspecified: Secondary | ICD-10-CM | POA: Diagnosis present

## 2023-03-09 DIAGNOSIS — E162 Hypoglycemia, unspecified: Secondary | ICD-10-CM | POA: Diagnosis not present

## 2023-03-09 DIAGNOSIS — J690 Pneumonitis due to inhalation of food and vomit: Secondary | ICD-10-CM | POA: Diagnosis present

## 2023-03-09 DIAGNOSIS — F1010 Alcohol abuse, uncomplicated: Secondary | ICD-10-CM | POA: Diagnosis present

## 2023-03-09 DIAGNOSIS — R7989 Other specified abnormal findings of blood chemistry: Secondary | ICD-10-CM | POA: Diagnosis present

## 2023-03-09 DIAGNOSIS — K7030 Alcoholic cirrhosis of liver without ascites: Secondary | ICD-10-CM | POA: Diagnosis present

## 2023-03-09 DIAGNOSIS — Z79899 Other long term (current) drug therapy: Secondary | ICD-10-CM

## 2023-03-09 DIAGNOSIS — E663 Overweight: Secondary | ICD-10-CM | POA: Diagnosis present

## 2023-03-09 DIAGNOSIS — R4182 Altered mental status, unspecified: Principal | ICD-10-CM

## 2023-03-09 DIAGNOSIS — E440 Moderate protein-calorie malnutrition: Secondary | ICD-10-CM | POA: Diagnosis present

## 2023-03-09 DIAGNOSIS — K921 Melena: Secondary | ICD-10-CM

## 2023-03-09 DIAGNOSIS — E871 Hypo-osmolality and hyponatremia: Secondary | ICD-10-CM | POA: Diagnosis not present

## 2023-03-09 DIAGNOSIS — Z91040 Latex allergy status: Secondary | ICD-10-CM

## 2023-03-09 DIAGNOSIS — K3189 Other diseases of stomach and duodenum: Secondary | ICD-10-CM | POA: Diagnosis present

## 2023-03-09 DIAGNOSIS — D61818 Other pancytopenia: Secondary | ICD-10-CM | POA: Diagnosis present

## 2023-03-09 DIAGNOSIS — K922 Gastrointestinal hemorrhage, unspecified: Secondary | ICD-10-CM | POA: Diagnosis not present

## 2023-03-09 DIAGNOSIS — F32A Depression, unspecified: Secondary | ICD-10-CM | POA: Diagnosis present

## 2023-03-09 DIAGNOSIS — K766 Portal hypertension: Secondary | ICD-10-CM | POA: Diagnosis present

## 2023-03-09 DIAGNOSIS — E876 Hypokalemia: Secondary | ICD-10-CM | POA: Diagnosis present

## 2023-03-09 DIAGNOSIS — Z811 Family history of alcohol abuse and dependence: Secondary | ICD-10-CM

## 2023-03-09 DIAGNOSIS — I10 Essential (primary) hypertension: Secondary | ICD-10-CM | POA: Diagnosis present

## 2023-03-09 DIAGNOSIS — A419 Sepsis, unspecified organism: Secondary | ICD-10-CM

## 2023-03-09 DIAGNOSIS — Z66 Do not resuscitate: Secondary | ICD-10-CM

## 2023-03-09 DIAGNOSIS — T17908A Unspecified foreign body in respiratory tract, part unspecified causing other injury, initial encounter: Secondary | ICD-10-CM | POA: Diagnosis present

## 2023-03-09 DIAGNOSIS — I8510 Secondary esophageal varices without bleeding: Secondary | ICD-10-CM | POA: Diagnosis present

## 2023-03-09 DIAGNOSIS — D684 Acquired coagulation factor deficiency: Secondary | ICD-10-CM | POA: Diagnosis present

## 2023-03-09 DIAGNOSIS — Z6829 Body mass index (BMI) 29.0-29.9, adult: Secondary | ICD-10-CM

## 2023-03-09 DIAGNOSIS — F10931 Alcohol use, unspecified with withdrawal delirium: Secondary | ICD-10-CM

## 2023-03-09 DIAGNOSIS — Z91048 Other nonmedicinal substance allergy status: Secondary | ICD-10-CM

## 2023-03-09 DIAGNOSIS — Z8249 Family history of ischemic heart disease and other diseases of the circulatory system: Secondary | ICD-10-CM

## 2023-03-09 LAB — CK: Total CK: 438 U/L — ABNORMAL HIGH (ref 38–234)

## 2023-03-09 LAB — AMMONIA: Ammonia: 165 umol/L — ABNORMAL HIGH (ref 9–35)

## 2023-03-09 LAB — ETHANOL: Alcohol, Ethyl (B): 175 mg/dL — ABNORMAL HIGH (ref <10)

## 2023-03-09 MED ORDER — LACTATED RINGERS IV BOLUS
1000.0000 mL | Freq: Once | INTRAVENOUS | Status: AC
  Administered 2023-03-09: 1000 mL via INTRAVENOUS

## 2023-03-09 NOTE — ED Triage Notes (Signed)
BIBA - came from home. Per EMS - Husband states she started detoxing from alcohol on Wednesday/Thursday. He came home today and states she was altered and couldn't talk. Pt got 5 mg of versed IM in route due to increased agitation. Hx of liver cirrhosis.

## 2023-03-09 NOTE — ED Provider Notes (Signed)
EMERGENCY DEPARTMENT AT [facility] Provider Note   CSN: [id number] Arrival date & time: 03/09/23  2205     History {Add pertinent medical, surgical, social history, OB history to HPI:1} Chief Complaint  Patient presents with   Withdrawal    ETOH Withdrawal    Joann [name] is a 42 y.o. female Presents with her husband at the bedside with concern for altered mental status.  Patient with history of alcoholic cirrhosis, relapsed with alcohol use recently but stopped drinking alcohol abruptly 48 hours ago.  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 At home this evening she was sitting inside of a storage tote in their closet moaning.  Blood on her lips, agitated and confused.  He called EMS, patient received 5 mg of IM Versed and route due to agitation.  He states that she is on lactulose at home.  History of delirium tremens.    Level 5 caveat due to altered mental status.  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  HPI     Home Medications Prior to Admission medications   Medication Sig Start Date End Date Taking? Authorizing Provider  folic acid (FOLVITE) 1 MG tablet Take 1 tablet (1 mg total) by mouth daily. 01/25/23   Alwyn Ren, MD  lactulose (CHRONULAC) 10 GM/15ML solution Take 45 mLs (30 g total) by mouth 3 (three) times daily. 01/25/23   Alwyn Ren, MD  Multiple Vitamin (MULTIVITAMIN WITH MINERALS) TABS tablet Take 1 tablet by mouth daily. Patient taking differently: Take 1 tablet by mouth daily with breakfast. 01/15/20   Angiulli, Mcarthur Rossetti, PA-C  ondansetron (ZOFRAN) 4 MG tablet Take 1 tablet (4 mg total) by mouth every 6 (six) hours as needed for nausea. 01/25/23   Alwyn Ren, MD  pantoprazole (PROTONIX) 40 MG tablet Take 1 tablet (40 mg total) by mouth 2 (two) times daily. 01/25/23   Alwyn Ren, MD  potassium chloride SA  (KLOR-CON M) 20 MEQ tablet Take 2 tablets (40 mEq total) by mouth daily. 01/25/23   Alwyn Ren, MD  rifaximin (XIFAXAN) 550 MG TABS tablet Take 1 tablet (550 mg total) by mouth 2 (two) times daily. 01/25/23   Alwyn Ren, MD  thiamine (VITAMIN B-1) 100 MG tablet Take 1 tablet (100 mg total) by mouth daily. 01/25/23   Alwyn Ren, MD      Allergies    Latex and Tape    Review of Systems   Review of Systems  Unable to perform ROS: Mental status change    Physical Exam Updated Vital Signs BP 139/82   Pulse (!) 150   Temp 98.7 F (37.1 C) (Axillary)   Resp 19   Ht 5\' 1"  (1.549 m)   Wt 66.7 kg   SpO2 99%   BMI 27.79 kg/m  Physical Exam Vitals and nursing note reviewed.  Constitutional:      Appearance: She is ill-appearing and diaphoretic.  HENT:     Head: Normocephalic and atraumatic.     Mouth/Throat:     Mouth: Mucous membranes are moist.     Pharynx: No oropharyngeal exudate or posterior oropharyngeal erythema.  Eyes:     General:        Right eye: No discharge.        Left eye: No discharge.     Conjunctiva/sclera: Conjunctivae normal.  Pupils: Pupils are equal, round, and reactive to light.  Cardiovascular:     Rate and Rhythm: Regular rhythm. Tachycardia present.     Pulses: Normal pulses.     Heart sounds: Normal heart sounds.  Pulmonary:     Effort: Pulmonary effort is normal. No respiratory distress.     Breath sounds: Normal breath sounds. No wheezing or rales.  Abdominal:     General: Bowel sounds are normal. There is no distension.     Palpations: Abdomen is soft.     Tenderness: There is no abdominal tenderness.  Musculoskeletal:        General: No deformity.     Cervical back: Neck supple.  Skin:    General: Skin is warm.     Capillary Refill: Capillary refill takes less than 2 seconds.  Neurological:     Mental Status: She is disoriented.     GCS: GCS eye subscore is 4. GCS verbal subscore is 1. GCS motor subscore is 5.      ED Results / Procedures / Treatments   Labs (all labs ordered are listed, but only abnormal results are displayed) Labs Reviewed  COMPREHENSIVE METABOLIC PANEL  ETHANOL  CBC  RAPID URINE DRUG SCREEN, HOSP PERFORMED  HCG, SERUM, QUALITATIVE  LACTIC ACID, PLASMA  LACTIC ACID, PLASMA  AMMONIA    EKG None  Radiology No results found.  Procedures Procedures  {Document cardiac monitor, telemetry assessment procedure when appropriate:1}  Medications Ordered in ED Medications - No data to display  ED Course/ Medical Decision Making/ A&P   {   Click here for ABCD2, HEART and other calculatorsREFRESH Note before signing :1}                          Medical Decision Making 42 year old female who presents with altered mental status and tachycardia in context of recent cessation suddenly of alcohol use, history of alcoholic cirrhosis.  Tachycardic on intake, warm to the touch.  Vital signs otherwise normal.  Patient nonverbal at time of my evaluation, unable to follow commands, altered in her mental status, GCS of 10.  No evidence of trauma on exam.  PERRL.  The differential diagnosis for AMS is extensive and includes, but is not limited to:  Drug overdose - opioids, alcohol, sedatives, antipsychotics, drug withdrawal, others Metabolic: hypoxia, hypoglycemia, hyperglycemia, hypercalcemia, hypernatremia, hyponatremia, uremia, hepatic encephalopathy, hypothyroidism, hyperthyroidism, vitamin B12 or thiamine deficiency, carbon monoxide poisoning, Wilson's disease, Lactic acidosis, ***DKA/HHOS Infectious: meningitis, encephalitis, bacteremia/sepsis, urinary tract infection, pneumonia, neurosyphilis Structural: Space-occupying lesion, (brain tumor, subdural hematoma, hydrocephalus,) Vascular: stroke, subarachnoid hemorrhage, coronary ischemia, hypertensive encephalopathy, CNS vasculitis, thrombotic thrombocytopenic purpura, disseminated intravascular coagulation,  hyperviscosity Psychiatric: Schizophrenia, depression; Other: Seizure, hypothermia, heat stroke, ICU psychosis, dementia -"sundowning."     Amount and/or Complexity of Data Reviewed Labs: ordered. Radiology: ordered.  Risk Decision regarding hospitalization.   ***  {Document critical care time when appropriate:1} {Document review of labs and clinical decision tools ie heart score, Chads2Vasc2 etc:1}  {Document your independent review of radiology images, and any outside records:1} {Document your discussion with family members, caretakers, and with consultants:1} {Document social determinants of health affecting pt's care:1} {Document your decision making why or why not admission, treatments were needed:1} Final Clinical Impression(s) / ED Diagnoses Final diagnoses:  None    Rx / DC Orders ED Discharge Orders     None

## 2023-03-10 ENCOUNTER — Inpatient Hospital Stay (HOSPITAL_COMMUNITY): Payer: Medicaid Other

## 2023-03-10 ENCOUNTER — Emergency Department (HOSPITAL_COMMUNITY): Payer: Medicaid Other

## 2023-03-10 DIAGNOSIS — K7682 Hepatic encephalopathy: Secondary | ICD-10-CM

## 2023-03-10 DIAGNOSIS — E039 Hypothyroidism, unspecified: Secondary | ICD-10-CM | POA: Diagnosis present

## 2023-03-10 DIAGNOSIS — N179 Acute kidney failure, unspecified: Secondary | ICD-10-CM | POA: Diagnosis present

## 2023-03-10 DIAGNOSIS — K921 Melena: Secondary | ICD-10-CM | POA: Diagnosis not present

## 2023-03-10 DIAGNOSIS — R4182 Altered mental status, unspecified: Secondary | ICD-10-CM

## 2023-03-10 DIAGNOSIS — I959 Hypotension, unspecified: Secondary | ICD-10-CM | POA: Diagnosis present

## 2023-03-10 DIAGNOSIS — Z66 Do not resuscitate: Secondary | ICD-10-CM | POA: Diagnosis present

## 2023-03-10 DIAGNOSIS — D696 Thrombocytopenia, unspecified: Secondary | ICD-10-CM | POA: Diagnosis not present

## 2023-03-10 DIAGNOSIS — K920 Hematemesis: Secondary | ICD-10-CM | POA: Diagnosis not present

## 2023-03-10 DIAGNOSIS — E872 Acidosis, unspecified: Secondary | ICD-10-CM | POA: Diagnosis present

## 2023-03-10 DIAGNOSIS — F1010 Alcohol abuse, uncomplicated: Secondary | ICD-10-CM | POA: Diagnosis not present

## 2023-03-10 DIAGNOSIS — R569 Unspecified convulsions: Secondary | ICD-10-CM

## 2023-03-10 DIAGNOSIS — I8510 Secondary esophageal varices without bleeding: Secondary | ICD-10-CM | POA: Diagnosis present

## 2023-03-10 DIAGNOSIS — N12 Tubulo-interstitial nephritis, not specified as acute or chronic: Secondary | ICD-10-CM | POA: Diagnosis present

## 2023-03-10 DIAGNOSIS — F32A Depression, unspecified: Secondary | ICD-10-CM | POA: Diagnosis present

## 2023-03-10 DIAGNOSIS — G9340 Encephalopathy, unspecified: Secondary | ICD-10-CM | POA: Insufficient documentation

## 2023-03-10 DIAGNOSIS — Z7189 Other specified counseling: Secondary | ICD-10-CM | POA: Diagnosis not present

## 2023-03-10 DIAGNOSIS — E440 Moderate protein-calorie malnutrition: Secondary | ICD-10-CM | POA: Diagnosis present

## 2023-03-10 DIAGNOSIS — Z515 Encounter for palliative care: Secondary | ICD-10-CM | POA: Diagnosis not present

## 2023-03-10 DIAGNOSIS — J690 Pneumonitis due to inhalation of food and vomit: Secondary | ICD-10-CM | POA: Diagnosis present

## 2023-03-10 DIAGNOSIS — K7030 Alcoholic cirrhosis of liver without ascites: Secondary | ICD-10-CM | POA: Diagnosis present

## 2023-03-10 DIAGNOSIS — D62 Acute posthemorrhagic anemia: Secondary | ICD-10-CM | POA: Diagnosis present

## 2023-03-10 DIAGNOSIS — I10 Essential (primary) hypertension: Secondary | ICD-10-CM | POA: Diagnosis present

## 2023-03-10 DIAGNOSIS — T17908A Unspecified foreign body in respiratory tract, part unspecified causing other injury, initial encounter: Secondary | ICD-10-CM | POA: Diagnosis present

## 2023-03-10 DIAGNOSIS — D684 Acquired coagulation factor deficiency: Secondary | ICD-10-CM | POA: Diagnosis present

## 2023-03-10 DIAGNOSIS — R58 Hemorrhage, not elsewhere classified: Secondary | ICD-10-CM | POA: Diagnosis present

## 2023-03-10 DIAGNOSIS — E871 Hypo-osmolality and hyponatremia: Secondary | ICD-10-CM | POA: Diagnosis not present

## 2023-03-10 DIAGNOSIS — D61818 Other pancytopenia: Secondary | ICD-10-CM | POA: Diagnosis present

## 2023-03-10 DIAGNOSIS — F10239 Alcohol dependence with withdrawal, unspecified: Secondary | ICD-10-CM | POA: Diagnosis present

## 2023-03-10 DIAGNOSIS — K922 Gastrointestinal hemorrhage, unspecified: Secondary | ICD-10-CM | POA: Diagnosis not present

## 2023-03-10 DIAGNOSIS — E8809 Other disorders of plasma-protein metabolism, not elsewhere classified: Secondary | ICD-10-CM | POA: Diagnosis not present

## 2023-03-10 DIAGNOSIS — E870 Hyperosmolality and hypernatremia: Secondary | ICD-10-CM | POA: Diagnosis not present

## 2023-03-10 DIAGNOSIS — K861 Other chronic pancreatitis: Secondary | ICD-10-CM | POA: Diagnosis present

## 2023-03-10 DIAGNOSIS — K766 Portal hypertension: Secondary | ICD-10-CM | POA: Diagnosis present

## 2023-03-10 DIAGNOSIS — G9341 Metabolic encephalopathy: Secondary | ICD-10-CM | POA: Diagnosis present

## 2023-03-10 LAB — BLOOD GAS, VENOUS
Acid-base deficit: 9.5 mmol/L — ABNORMAL HIGH (ref 0.0–2.0)
Bicarbonate: 14.1 mmol/L — ABNORMAL LOW (ref 20.0–28.0)
O2 Saturation: 83.1 %
Patient temperature: 37
pCO2, Ven: 25 mmHg — ABNORMAL LOW (ref 44–60)
pH, Ven: 7.36 (ref 7.25–7.43)
pO2, Ven: 58 mmHg — ABNORMAL HIGH (ref 32–45)

## 2023-03-10 LAB — MAGNESIUM
Magnesium: 1.2 mg/dL — ABNORMAL LOW (ref 1.7–2.4)
Magnesium: 1.1 mg/dL — ABNORMAL LOW (ref 1.7–2.4)

## 2023-03-10 LAB — CK: Total CK: 561 U/L — ABNORMAL HIGH (ref 38–234)

## 2023-03-10 LAB — COMPREHENSIVE METABOLIC PANEL
ALT: 17 U/L (ref 0–44)
ALT: 16 U/L (ref 0–44)
AST: 153 U/L — ABNORMAL HIGH (ref 15–41)
AST: 162 U/L — ABNORMAL HIGH (ref 15–41)
Albumin: 2.6 g/dL — ABNORMAL LOW (ref 3.5–5.0)
Albumin: 2.2 g/dL — ABNORMAL LOW (ref 3.5–5.0)
Alkaline Phosphatase: 115 U/L (ref 38–126)
Alkaline Phosphatase: 91 U/L (ref 38–126)
Anion gap: 23 — ABNORMAL HIGH (ref 5–15)
Anion gap: 20 — ABNORMAL HIGH (ref 5–15)
BUN: 9 mg/dL (ref 6–20)
BUN: 7 mg/dL (ref 6–20)
CO2: 14 mmol/L — ABNORMAL LOW (ref 22–32)
CO2: 14 mmol/L — ABNORMAL LOW (ref 22–32)
Calcium: 7.4 mg/dL — ABNORMAL LOW (ref 8.9–10.3)
Calcium: 7.1 mg/dL — ABNORMAL LOW (ref 8.9–10.3)
Chloride: 103 mmol/L (ref 98–111)
Chloride: 106 mmol/L (ref 98–111)
Creatinine, Ser: 1.15 mg/dL — ABNORMAL HIGH (ref 0.44–1.00)
Creatinine, Ser: 0.64 mg/dL (ref 0.44–1.00)
GFR, Estimated: >60 mL/min (ref >60)
GFR, Estimated: >60 mL/min (ref >60)
Glucose, Bld: 138 mg/dL — ABNORMAL HIGH (ref 70–99)
Glucose, Bld: 107 mg/dL — ABNORMAL HIGH (ref 70–99)
Potassium: 2.5 mmol/L — CL (ref 3.5–5.1)
Potassium: 3.4 mmol/L — ABNORMAL LOW (ref 3.5–5.1)
Sodium: 140 mmol/L (ref 135–145)
Sodium: 140 mmol/L (ref 135–145)
Total Bilirubin: 6.4 mg/dL — ABNORMAL HIGH (ref 0.3–1.2)
Total Bilirubin: 5.9 mg/dL — ABNORMAL HIGH (ref 0.3–1.2)
Total Protein: 10.4 g/dL — ABNORMAL HIGH (ref 6.5–8.1)
Total Protein: 8.3 g/dL — ABNORMAL HIGH (ref 6.5–8.1)

## 2023-03-10 LAB — BLOOD GAS, ARTERIAL
Patient temperature: 36.7
pCO2 arterial: 21 mmHg — ABNORMAL LOW (ref 32–48)
pH, Arterial: 7.47 — ABNORMAL HIGH (ref 7.35–7.45)

## 2023-03-10 LAB — CBC
HCT: 33.8 % — ABNORMAL LOW (ref 36.0–46.0)
Hemoglobin: 10.4 g/dL — ABNORMAL LOW (ref 12.0–15.0)
MCH: 30.7 pg (ref 26.0–34.0)
MCHC: 30.8 g/dL (ref 30.0–36.0)
MCV: 99.7 fL (ref 80.0–100.0)
Platelets: 36 10*3/uL — ABNORMAL LOW (ref 150–400)
RBC: 3.39 MIL/uL — ABNORMAL LOW (ref 3.87–5.11)
RDW: 21 % — ABNORMAL HIGH (ref 11.5–15.5)
WBC: 7.3 10*3/uL (ref 4.0–10.5)
nRBC: 0 % (ref 0.0–0.2)

## 2023-03-10 LAB — MRSA NEXT GEN BY PCR, NASAL: MRSA by PCR Next Gen: NOT DETECTED

## 2023-03-10 LAB — URINALYSIS, ROUTINE W REFLEX MICROSCOPIC
Bilirubin Urine: NEGATIVE
Glucose, UA: NEGATIVE mg/dL
Hgb urine dipstick: NEGATIVE
Ketones, ur: 20 mg/dL — AB
Leukocytes,Ua: NEGATIVE
Nitrite: NEGATIVE
Protein, ur: 30 mg/dL — AB
Specific Gravity, Urine: 1.014 (ref 1.005–1.030)
pH: 7 (ref 5.0–8.0)

## 2023-03-10 LAB — CBC WITH DIFFERENTIAL/PLATELET
Abs Immature Granulocytes: 0.04 10*3/uL (ref 0.00–0.07)
Basophils Absolute: 0 10*3/uL (ref 0.0–0.1)
Basophils Relative: 0 %
Eosinophils Absolute: 0 10*3/uL (ref 0.0–0.5)
Eosinophils Relative: 0 %
HCT: 28.6 % — ABNORMAL LOW (ref 36.0–46.0)
Hemoglobin: 8.7 g/dL — ABNORMAL LOW (ref 12.0–15.0)
Immature Granulocytes: 1 %
Lymphocytes Relative: 14 %
Lymphs Abs: 0.7 10*3/uL (ref 0.7–4.0)
MCH: 30.2 pg (ref 26.0–34.0)
MCHC: 30.4 g/dL (ref 30.0–36.0)
MCV: 99.3 fL (ref 80.0–100.0)
Monocytes Absolute: 0.2 10*3/uL (ref 0.1–1.0)
Monocytes Relative: 4 %
Neutro Abs: 4.1 10*3/uL (ref 1.7–7.7)
Neutrophils Relative %: 81 %
Platelets: 22 10*3/uL — CL (ref 150–400)
RBC: 2.88 MIL/uL — ABNORMAL LOW (ref 3.87–5.11)
RDW: 20.9 % — ABNORMAL HIGH (ref 11.5–15.5)
WBC: 5.1 10*3/uL (ref 4.0–10.5)
nRBC: 0 % (ref 0.0–0.2)

## 2023-03-10 LAB — RAPID URINE DRUG SCREEN, HOSP PERFORMED
Amphetamines: NOT DETECTED
Barbiturates: NOT DETECTED
Benzodiazepines: POSITIVE — AB
Cocaine: NOT DETECTED
Opiates: NOT DETECTED
Tetrahydrocannabinol: NOT DETECTED

## 2023-03-10 LAB — LACTIC ACID, PLASMA
Lactic Acid, Venous: 5.6 mmol/L (ref 0.5–1.9)
Lactic Acid, Venous: 8.7 mmol/L (ref 0.5–1.9)
Lactic Acid, Venous: >9 mmol/L (ref 0.5–1.9)
Lactic Acid, Venous: >9 mmol/L (ref 0.5–1.9)

## 2023-03-10 LAB — CBG MONITORING, ED
Glucose-Capillary: 106 mg/dL — ABNORMAL HIGH (ref 70–99)
Glucose-Capillary: 100 mg/dL — ABNORMAL HIGH (ref 70–99)

## 2023-03-10 LAB — CULTURE, BLOOD (ROUTINE X 2)

## 2023-03-10 LAB — C-REACTIVE PROTEIN: CRP: 1.7 mg/dL — ABNORMAL HIGH (ref <1.0)

## 2023-03-10 LAB — PREGNANCY, URINE: Preg Test, Ur: NEGATIVE

## 2023-03-10 LAB — PHOSPHORUS: Phosphorus: 1 mg/dL — CL (ref 2.5–4.6)

## 2023-03-10 LAB — PROCALCITONIN: Procalcitonin: 0.38 ng/mL

## 2023-03-10 MED ORDER — SODIUM CHLORIDE 0.9 % IV SOLN
2.0000 g | Freq: Three times a day (TID) | INTRAVENOUS | Status: DC
  Administered 2023-03-10 – 2023-03-12 (×7): 2 g via INTRAVENOUS
  Filled 2023-03-10 (×7): qty 12.5

## 2023-03-10 MED ORDER — POTASSIUM CHLORIDE 10 MEQ/100ML IV SOLN
10.0000 meq | INTRAVENOUS | Status: AC
  Administered 2023-03-10 (×3): 10 meq via INTRAVENOUS
  Filled 2023-03-10 (×3): qty 100

## 2023-03-10 MED ORDER — FOLIC ACID 1 MG PO TABS: 1.0000 mg | ORAL_TABLET | Freq: Every day | ORAL | Status: DC

## 2023-03-10 MED ORDER — LORAZEPAM 1 MG PO TABS
1.0000 mg | ORAL_TABLET | ORAL | Status: DC | PRN
  Filled 2023-03-10: qty 4

## 2023-03-10 MED ORDER — ACETAMINOPHEN 650 MG RE SUPP: 650.0000 mg | Freq: Four times a day (QID) | RECTAL | Status: DC | PRN

## 2023-03-10 MED ORDER — LACTATED RINGERS IV SOLN: INTRAVENOUS | Status: DC

## 2023-03-10 MED ORDER — IOHEXOL 300 MG/ML  SOLN
100.0000 mL | Freq: Once | INTRAMUSCULAR | Status: AC | PRN
  Administered 2023-03-10: 100 mL via INTRAVENOUS

## 2023-03-10 MED ORDER — ONDANSETRON HCL 4 MG/2ML IJ SOLN: 4.0000 mg | Freq: Four times a day (QID) | INTRAMUSCULAR | Status: DC | PRN

## 2023-03-10 MED ORDER — LORAZEPAM 2 MG/ML IJ SOLN
1.0000 mg | INTRAMUSCULAR | Status: DC | PRN
  Administered 2023-03-11: 4 mg via INTRAVENOUS
  Administered 2023-03-11 (×3): 2 mg via INTRAVENOUS
  Filled 2023-03-10 (×3): qty 2

## 2023-03-10 MED ORDER — LACTATED RINGERS IV BOLUS (SEPSIS)
250.0000 mL | Freq: Once | INTRAVENOUS | Status: AC
  Administered 2023-03-10: 250 mL via INTRAVENOUS

## 2023-03-10 MED ORDER — METRONIDAZOLE 500 MG/100ML IV SOLN
500.0000 mg | Freq: Once | INTRAVENOUS | Status: AC
  Administered 2023-03-10: 500 mg via INTRAVENOUS
  Filled 2023-03-10: qty 100

## 2023-03-10 MED ORDER — ADULT MULTIVITAMIN W/MINERALS CH: 1.0000 | ORAL_TABLET | Freq: Every day | ORAL | Status: DC

## 2023-03-10 MED ORDER — SODIUM CHLORIDE 0.9 % IV SOLN: 2.0000 g | Freq: Two times a day (BID) | INTRAVENOUS | Status: DC

## 2023-03-10 MED ORDER — SODIUM CHLORIDE (PF) 0.9 % IJ SOLN
INTRAMUSCULAR | Status: AC
  Filled 2023-03-10: qty 50

## 2023-03-10 MED ORDER — VANCOMYCIN HCL IN DEXTROSE 1-5 GM/200ML-% IV SOLN
1000.0000 mg | Freq: Once | INTRAVENOUS | Status: AC
  Administered 2023-03-10: 1000 mg via INTRAVENOUS
  Filled 2023-03-10: qty 200

## 2023-03-10 MED ORDER — LACTULOSE 10 GM/15ML PO SOLN
30.0000 g | Freq: Three times a day (TID) | ORAL | Status: DC
  Administered 2023-03-10 – 2023-03-11 (×3): 30 g
  Filled 2023-03-10 (×4): qty 45

## 2023-03-10 MED ORDER — PANTOPRAZOLE SODIUM 40 MG IV SOLR
40.0000 mg | INTRAVENOUS | Status: DC
  Administered 2023-03-10 – 2023-03-11 (×2): 40 mg via INTRAVENOUS
  Filled 2023-03-10 (×2): qty 10

## 2023-03-10 MED ORDER — ACETAMINOPHEN 325 MG PO TABS: 650.0000 mg | ORAL_TABLET | Freq: Four times a day (QID) | ORAL | Status: DC | PRN

## 2023-03-10 MED ORDER — ORAL CARE MOUTH RINSE: 15.0000 mL | OROMUCOSAL | Status: DC | PRN

## 2023-03-10 MED ORDER — POTASSIUM CHLORIDE 20 MEQ PO PACK
40.0000 meq | PACK | Freq: Once | ORAL | Status: AC
  Administered 2023-03-10: 40 meq
  Filled 2023-03-10: qty 2

## 2023-03-10 MED ORDER — LACTULOSE ENEMA
300.0000 mL | Freq: Once | ORAL | Status: AC
  Administered 2023-03-10: 300 mL via RECTAL
  Filled 2023-03-10: qty 300

## 2023-03-10 MED ORDER — THIAMINE MONONITRATE 100 MG PO TABS: 100.0000 mg | ORAL_TABLET | Freq: Every day | ORAL | Status: DC

## 2023-03-10 MED ORDER — LACTATED RINGERS IV BOLUS (SEPSIS)
1000.0000 mL | Freq: Once | INTRAVENOUS | Status: AC
  Administered 2023-03-10: 1000 mL via INTRAVENOUS

## 2023-03-10 MED ORDER — SODIUM CHLORIDE 0.9 % IV SOLN
2.0000 g | Freq: Once | INTRAVENOUS | Status: AC
  Administered 2023-03-10: 2 g via INTRAVENOUS
  Filled 2023-03-10: qty 12.5

## 2023-03-10 MED ORDER — MELATONIN 3 MG PO TABS: 3.0000 mg | ORAL_TABLET | Freq: Every evening | ORAL | Status: DC | PRN

## 2023-03-10 MED ORDER — POTASSIUM PHOSPHATES 15 MMOLE/5ML IV SOLN
30.0000 mmol | Freq: Once | INTRAVENOUS | Status: AC
  Administered 2023-03-10: 30 mmol via INTRAVENOUS
  Filled 2023-03-10: qty 10

## 2023-03-10 MED ORDER — MAGNESIUM SULFATE 4 GM/100ML IV SOLN
4.0000 g | Freq: Once | INTRAVENOUS | Status: AC
  Administered 2023-03-10: 4 g via INTRAVENOUS
  Filled 2023-03-10: qty 100

## 2023-03-10 MED ORDER — RIFAXIMIN 550 MG PO TABS
550.0000 mg | ORAL_TABLET | Freq: Two times a day (BID) | ORAL | Status: DC
  Administered 2023-03-10 – 2023-03-12 (×5): 550 mg
  Filled 2023-03-10 (×5): qty 1

## 2023-03-10 MED ORDER — MAGNESIUM SULFATE 2 GM/50ML IV SOLN
2.0000 g | Freq: Once | INTRAVENOUS | Status: AC
  Administered 2023-03-10: 2 g via INTRAVENOUS
  Filled 2023-03-10: qty 50

## 2023-03-10 MED ORDER — THIAMINE HCL 100 MG/ML IJ SOLN
100.0000 mg | Freq: Every day | INTRAMUSCULAR | Status: DC
  Administered 2023-03-10: 100 mg via INTRAVENOUS
  Filled 2023-03-10: qty 2

## 2023-03-10 MED ORDER — METRONIDAZOLE 500 MG/100ML IV SOLN
500.0000 mg | Freq: Two times a day (BID) | INTRAVENOUS | Status: DC
  Administered 2023-03-10 – 2023-03-12 (×5): 500 mg via INTRAVENOUS
  Filled 2023-03-10 (×5): qty 100

## 2023-03-10 MED ORDER — CHLORHEXIDINE GLUCONATE CLOTH 2 % EX PADS
6.0000 | MEDICATED_PAD | Freq: Every day | CUTANEOUS | Status: DC
  Administered 2023-03-10 – 2023-03-13 (×4): 6 via TOPICAL

## 2023-03-10 MED ORDER — ALBUMIN HUMAN 25 % IV SOLN
25.0000 g | Freq: Four times a day (QID) | INTRAVENOUS | Status: AC
  Administered 2023-03-10 – 2023-03-11 (×4): 25 g via INTRAVENOUS
  Filled 2023-03-10 (×4): qty 100

## 2023-03-10 NOTE — ED Notes (Signed)
Date and time results received: 03/10/23 0052  Test: Lactic acid; Potassium Critical Value: >9; 2.5  Name of Provider Notified: Loleta Dicker  Orders Received? Or Actions Taken?: Orders Received - See Orders for details

## 2023-03-10 NOTE — Procedures (Addendum)
Patient Name: MANJOT PEIFFER  MRN: [id number]  Epilepsy Attending: Charlsie Quest  Referring Physician/Provider: Lorin Glass, MD  Date: 03/10/2023 Duration: 55.40 mins  Patient history: 42yo F with ams getting rapid EEG to evaluate for seizure.  Level of alertness:  lethargic   AEDs during EEG study: None  Technical aspects: This EEG was obtained using a 10 lead EEG system positioned circumferentially without any parasagittal coverage (rapid EEG). Computer selected EEG is reviewed as  well as background features and all clinically significant events.  Description: EEG showed continuous generalized 3-5Hz  theta-delta slowing. Hyperventilation and photic stimulation were not performed.     ABNORMALITY - Continuous slow, generalized  IMPRESSION: This limited ceribell EEG is study is suggestive of moderate to severe diffuse encephalopathy, nonspecific etiology . No seizures or epileptiform discharges were seen throughout the recording.  If suspicion for interictal activity remains a concern, a conventional EEG can be considered.   Dr. Katrinka Blazing was notified.  Zikeria Keough Annabelle Harman

## 2023-03-10 NOTE — H&P (Signed)
History and Physical    Patient: Joann [name]:[id number] DOB: July 16, 1981 DOA: 03/09/2023 DOS: the patient was seen and examined on 03/10/2023 PCP: Zigmund Daniel., MD  Patient coming from: Home  Chief Complaint:  Chief Complaint  Patient presents with   Altered Mental Status   HPI: Joann [name] is a 42 y.o. female with medical history significant of alcohol abuse, alcoholic ketoacidosis, anxiety, history of DTs, elevated LFTs, hepatic encephalopathy, hypertension, hypokalemia, History of upper GI bleed, AVM of small bowel, bleeding esophageal varices, hypomagnesemia who was brought from home via EMS due to altered mental status.  According to her husband the patient was trying to detox from alcohol starting Wednesday and Thursday.  Her husband stated that he came home yesterday, found her altered and unable to talk.  She was very agitated when EMS arrived.  She received 5 mg of midazolam IM and route to the hospital.  She is unable to provide further information at the moment.  Lab work: Urinalysis showed ketones of 20, protein 30 mg/dL and rare bacteria microscopic examination.  CBC showed white count 7.3, Monnin 10.4 g/dL platelets 36.  Ammonia level was 165 mol/L.  Total CK4 138 units/L.  LDL alcohol 175 mg/dL.  Lactic acid greater than 9.0 mmol per Sital twice.  CMP with a sodium 140, potassium 2.5, chloride 103 and CO2 14 mmol/L.  Glucose 138, BUN 9, creatinine 1.15, calcium 7.4 and total bilirubin 6.4 mg/dL.  Total protein was 10.4 and albumin 2.6 g/dL.  AST 153, normal ALT and alkaline phosphatase.  ABG showed a pH of 7.36, pCO2 of 25 and pO2 of 58 mmHg.  Bicarbonate was 14.1 and acid-base deficit 9.5 mmol/L.  Magnesium was 1.2 mg/dL.  Imaging: CT head without contrast with no acute intracranial abnormality.  Technically difficult exam due to poor positioning.  Portable 1 view chest radiograph with cardiomegaly but no radiographic evidence for active cardiopulmonary disease.  CT  chest/abdomen/pelvis with contrast showing left lower lobe infrahilar airspace disease consistent with aspiration.  Small hiatal hernia.  Cirrhotic liver with mild hepatic asteatosis.  Multiple scattered tiny hypodensities which are too small to characterize.  There is a patent TIPS stent.  Chronic calcific pancreatitis.  Nonobstructing nephrolithiasis.  Patchy hypoenhancement of the lower pole of both kidneys most likely due to pyelonephritis.  Cystitis versus bladder nondistention.  Ascending and sigmoid colitis versus nondistention.  Small umbilical fat hernia.   ED course: Initial vital signs were temperature 98.7 F, pulse 141, respiration 20, BP 135/83 mmHg O2 sat 100% on room air.  The patient received cefepime, metronidazole, vancomycin 20 to 50 mL of LR bolus and KCl 10 mill equivalents IVPB x 3.  She was started on CIWA protocol with lorazepam.  Review of Systems: As mentioned in the history of present illness. All other systems reviewed and are negative. Past Medical History:  Diagnosis Date   [redacted] weeks gestation of pregnancy 03/22/2020   Alcohol abuse    Alcohol withdrawal delirium (HCC) 02/15/2022   Alcohol-induced acute pancreatitis    Alcoholic ketoacidosis 08/28/2018   Anxiety    DTs (delirium tremens) (HCC)    Elevated LFTs 10/24/2018   Encephalopathy acute    Hepatic encephalopathy (HCC) 12/24/2019   Hypertension    Hypokalemia 04/02/2019   Hypotension 02/24/2022   Liver disease    Past Surgical History:  Procedure Laterality Date   ECTOPIC PREGNANCY SURGERY     ESOPHAGEAL BANDING  07/25/2022   Procedure: ESOPHAGEAL BANDING;  Surgeon:  Mansouraty, Netty Starring., MD;  Location: [facility];  Service: Gastroenterology;;   ESOPHAGEAL BANDING  08/30/2022   Procedure: ESOPHAGEAL BANDING;  Surgeon: Lemar Lofty., MD;  Location: [facility];  Service: Gastroenterology;;   ESOPHAGOGASTRODUODENOSCOPY (EGD) WITH PROPOFOL N/A 12/27/2019   Procedure: ESOPHAGOGASTRODUODENOSCOPY  (EGD) WITH PROPOFOL;  Surgeon: Jeani Hawking, MD;  Location[facility] ENDOSCOPY;  Service: Endoscopy;  Laterality: N/A;   ESOPHAGOGASTRODUODENOSCOPY (EGD) WITH PROPOFOL N/A 06/26/2022   Procedure: ESOPHAGOGASTRODUODENOSCOPY (EGD) WITH PROPOFOL;  Surgeon: Shellia Cleverly, DO;  Location: [facility] ENDOSCOPY;  Service: Gastroenterology;  Laterality: N/A;   ESOPHAGOGASTRODUODENOSCOPY (EGD) WITH PROPOFOL N/A 07/25/2022   Procedure: ESOPHAGOGASTRODUODENOSCOPY (EGD) WITH PROPOFOL;  Surgeon: Meridee Score Netty Starring., MD;  Location: [facility] ENDOSCOPY;  Service: Gastroenterology;  Laterality: N/A;   ESOPHAGOGASTRODUODENOSCOPY (EGD) WITH PROPOFOL N/A 08/30/2022   Procedure: ESOPHAGOGASTRODUODENOSCOPY (EGD) WITH PROPOFOL;  Surgeon: Meridee Score Netty Starring., MD;  Location: [facility] ENDOSCOPY;  Service: Gastroenterology;  Laterality: N/A;   HEMOSTASIS CLIP PLACEMENT  07/25/2022   Procedure: HEMOSTASIS CLIP PLACEMENT;  Surgeon: Lemar Lofty., MD;  Location: [facility]OS[facility];  Service: Gastroenterology;;   HOT HEMOSTASIS N/A 07/25/2022   Procedure: HOT HEMOSTASIS (ARGON PLASMA COAGULATION/BICAP);  Surgeon: Lemar Lofty., MD;  Location: [facility];  Service: Gastroenterology;  Laterality: N/A;   IR RADIOLOGIST EVAL & MGMT  08/13/2022   Social History:  reports that she has never smoked. She has never used smokeless tobacco. She reports current alcohol use. She reports that she does not use drugs.  Allergies  Allergen Reactions   Latex Hives and Other (See Comments)    BURNS THE SKIN   Tape Other (See Comments)    NO TAPES THAT CONTAIN LATEX- burns the skin    Family History  Problem Relation Age of Onset   Healthy Mother    Alcohol abuse Mother        quit in her 23s   Alcohol abuse Father    Diabetes Father    Healthy Brother    Hypertension Maternal Grandmother    Hypertension Maternal Grandfather    Healthy Brother    Healthy Brother     Prior to Admission medications   Medication Sig Start Date  End Date Taking? Authorizing Provider  disulfiram (ANTABUSE) 250 MG tablet Take 500 mg by mouth daily. 02/28/23 05/29/23 Yes [provider]  folic acid (FOLVITE) 1 MG tablet Take 1 tablet (1 mg total) by mouth daily. 01/25/23  Yes Alwyn Ren, MD  lactulose (CHRONULAC) 10 GM/15ML solution Take 45 mLs (30 g total) by mouth 3 (three) times daily. 01/25/23  Yes Alwyn Ren, MD  Multiple Vitamin (MULTIVITAMIN WITH MINERALS) TABS tablet Take 1 tablet by mouth daily. Patient taking differently: Take 1 tablet by mouth daily with breakfast. 01/15/20  Yes Angiulli, Mcarthur Rossetti, PA-C  ondansetron (ZOFRAN) 4 MG tablet Take 1 tablet (4 mg total) by mouth every 6 (six) hours as needed for nausea. 01/25/23  Yes Alwyn Ren, MD  pantoprazole (PROTONIX) 40 MG tablet Take 1 tablet (40 mg total) by mouth 2 (two) times daily. 01/25/23  Yes Alwyn Ren, MD  potassium chloride SA (KLOR-CON M) 20 MEQ tablet Take 2 tablets (40 mEq total) by mouth daily. 01/25/23  Yes Alwyn Ren, MD  rifaximin (XIFAXAN) 550 MG TABS tablet Take 1 tablet (550 mg total) by mouth 2 (two) times daily. 01/25/23  Yes Alwyn Ren, MD  thiamine (VITAMIN B-1) 100 MG tablet Take 1 tablet (100 mg total) by mouth daily. 01/25/23  Yes Alwyn Ren, MD    Physical Exam: Vitals:   03/10/23 0445 03/10/23 0500 03/10/23 0530 03/10/23 0545  BP:  (!) 151/84 (!) 160/86   Pulse: (!) 133 (!) 130 (!) 135 (!) 136  Resp: (!) 26 (!) 25 (!) 32 (!) 24  Temp:      TempSrc:      SpO2: 99% 99% 99% 97%  Weight:      Height:       Physical Exam Vitals and nursing note reviewed.  Constitutional:      General: She is not in acute distress.    Appearance: She is overweight. She is ill-appearing.  HENT:     Head: Normocephalic.     Nose: No rhinorrhea.     Mouth/Throat:     Mouth: Mucous membranes are dry.  Eyes:     General: Scleral icterus present.     Pupils: Pupils are equal, round, and reactive  to light.  Neck:     Vascular: No JVD.  Cardiovascular:     Rate and Rhythm: Regular rhythm. Tachycardia present.     Heart sounds: S1 normal and S2 normal.  Pulmonary:     Effort: Pulmonary effort is normal.     Breath sounds: Examination of the right-lower field reveals decreased breath sounds. Examination of the left-lower field reveals decreased breath sounds. Decreased breath sounds present. No wheezing, rhonchi or rales.  Abdominal:     General: Bowel sounds are normal.     Palpations: Abdomen is soft.     Tenderness: There is abdominal tenderness in the right upper quadrant. There is no right CVA tenderness, left CVA tenderness, guarding or rebound.  Musculoskeletal:     Cervical back: Neck supple.     Right lower leg: No edema.     Left lower leg: No edema.  Skin:    General: Skin is warm and dry.     Coloration: Skin is jaundiced.     Findings: Bruising and ecchymosis present.     Comments: Right posterior arm and right-sided posterior lateral chest wall ecchymoses.  Please see picture below.  Neurological:     General: No focal deficit present.     Mental Status: She is alert. She is disoriented.  Psychiatric:        Behavior: Behavior is uncooperative.     Data Reviewed:  Results are pending, will review when available.  Assessment and Plan: Principal Problem:   Acute hepatic encephalopathy (HCC) In the setting of:   Alcoholic cirrhosis (HCC) With associated:   Hypoalbuminemia   Abnormal LFTs Stepdown/inpatient. Neurochecks every 4 hours. Lactulose enema 200 g PR x 1. Follow-up LFTs and ammonia level. Treat underlying conditions. Continue IV fluids. Long-term prognosis poor.  Active Problems:   Aspiration into airway Will continue: -Cefepime 2 g IVPB every 12 hours. -Metronidazole 500 mg IVPB every 12 hours. Levalbuterol nebs as needed.    Lactic acidosis Likely from decreased hepatic clearance. Less likely sepsis due to  infection/aspiration. Continue IV fluids. Discussed with PCCM. Palliative care consulted.    ETOH abuse CIWA protocol with lorazepam. Magnesium sulfate supplementation. Folate, MVI and thiamine. Consult TOC team when more alert..    Thrombocytopenia (HCC) Secondary to liver cirrhosis. Monitor platelet count.    Essential hypertension Currently not on antihypertensives. Low-dose metoprolol IVP twice daily. Switch to low-dose p.o. nadolol or propranolol once more alert.    Hypokalemia Replacing. Follow-up potassium level.    Hypomagnesemia Replacing. Follow-up potassium level.  Ecchymosis In the setting of hepatic coagulopathy.     Advance Care Planning:   Code Status: Full Code   Consults: PCCM Levon Hedger, MD).  Family Communication:   Severity of Illness: The appropriate patient status for this patient is INPATIENT. Inpatient status is judged to be reasonable and necessary in order to provide the required intensity of service to ensure the patient's safety. The patient's presenting symptoms, physical exam findings, and initial radiographic and laboratory data in the context of their chronic comorbidities is felt to place them at high risk for further clinical deterioration. Furthermore, it is not anticipated that the patient will be medically stable for discharge from the hospital within 2 midnights of admission.   * I certify that at the point of admission it is my clinical judgment that the patient will require inpatient hospital care spanning beyond 2 midnights from the point of admission due to high intensity of service, high risk for further deterioration and high frequency of surveillance required.*  Author: Bobette Mo, MD 03/10/2023 6:23 AM  For on call review [email or web address].   This document was prepared using Dragon voice recognition software and may contain some unintended transcription errors.

## 2023-03-10 NOTE — Progress Notes (Signed)
A consult was received from an ED physician for vanc and cefepime per pharmacy dosing.  The patient's profile has been reviewed for ht/wt/allergies/indication/available labs.   A one time order has been placed for vanc 1gm and cefepime 2gm.    Further antibiotics/pharmacy consults should be ordered by admitting physician if indicated.                       Thank you, Arley Phenix RPh 03/10/2023, 12:59 AM

## 2023-03-10 NOTE — Sepsis Progress Note (Signed)
Elink following for Sepsis Protocol 

## 2023-03-10 NOTE — Progress Notes (Signed)
Pharmacy Antibiotic Note  Joann [name] is a 42 y.o. female admitted on 03/09/2023 with for altered mental status. Patient with history of alcoholic cirrhosis, relapsed with alcohol use recently but stopped drinking alcohol abruptly 48 hours ago. Pharmacy has been consulted to dose cefepime for sepsis.  Plan: - Change frequency of Cefepime to 2gm IV every 8 hours - as renal function has improved (6/30 CrCl= 81) - Follow renal function, cultures and clinical course  Height: 5\' 1"  (154.9 cm) Weight: 69.7 kg (153 lb 10.6 oz) IBW/kg (Calculated) : 47.8  Temp (24hrs), Avg:98.8 F (37.1 C), Min:98.2 F (36.8 C), Max:99.8 F (37.7 C)  Recent Labs  Lab 03/09/23 2223 03/09/23 2229 03/10/23 0030 03/10/23 0707  WBC  --  7.3  --  5.1  CREATININE  --  1.15*  --  0.64  LATICACIDVEN >9.0*  --  >9.0* 8.7*     Estimated Creatinine Clearance: 81 mL/min (by C-G formula based on SCr of 0.64 mg/dL).    Allergies  Allergen Reactions   Latex Hives and Other (See Comments)    BURNS THE SKIN   Tape Other (See Comments)    NO TAPES THAT CONTAIN LATEX- burns the skin    Antimicrobials this admission: 6/30 vanc  x 1 6/30 cefepime >> 6/30 flagyl >>  Dose adjustments this admission: 6/30 Cefepime 2gm IV every 12 hours to 2gm IV every 8 hours  Microbiology results: 6/30 BCx:  ngtd 6/30 MRSA PCR: negative   Thank you for allowing pharmacy to be a part of this patient's care.  Tacy Learn, PharmD Clinical Pharmacist 03/10/2023 11:35 AM

## 2023-03-10 NOTE — ED Notes (Signed)
Pt's breath smells like solvent

## 2023-03-10 NOTE — ED Notes (Signed)
Patient GCS reassessed and is down to 8. Attending notified of change and that pt may need upgraded from progressive. New orders obtained. Respiratory called for stat ABG. New labs obtained. Report given to Gaetano Net RN.

## 2023-03-10 NOTE — Consult Note (Signed)
NAME:  Joann [name], MRN:  [id number], DOB:  1980/10/22, LOS: 0 ADMISSION DATE:  03/09/2023, CONSULTATION DATE:  03/10/23 REFERRING MD:  Robb Matar, CHIEF COMPLAINT:  AMS   History of Present Illness:  42 year old woman with a history of severe alcoholism and alcoholic cirrhosis who is presenting with altered mental status.  She has a history of recurrent admissions for combination of withdrawal problems and/or hepatic encephalopathy.  Pulmonary critical care was consulted for persistent lactic acidemia and questionable ability to protect airway.  Pertinent  Medical History  End-stage alcoholic cirrhosis DTs  Significant Hospital Events: Including procedures, antibiotic start and stop dates in addition to other pertinent events   6/29 admit 6/30 ccm consult  Interim History / Subjective:  Consulted, patient will not answer any questions  Objective   Blood pressure (!) 109/47, pulse (!) 133, temperature 98.4 F (36.9 C), temperature source Axillary, resp. rate 20, height 5\' 1"  (1.549 m), weight 69.7 kg, SpO2 96 %.        Intake/Output Summary (Last 24 hours) at 03/10/2023 1231 Last data filed at 03/10/2023 9147 Gross per 24 hour  Intake 3387.94 ml  Output --  Net 3387.94 ml   Filed Weights   03/09/23 2221 03/10/23 0800  Weight: 66.7 kg 69.7 kg    Examination: Chronically ill-appearing woman staring off into space Mucous membranes dry, trachea midline, temporal wasting Heart sounds are regular, extremities are warm She is mildly tachypneic, no accessory muscle use, she has a cough, I guess she is protecting her airway Abdomen is soft, positive bowel sounds There is muscle wasting Pupils are equal, withdraws x 4  Severe lactic acidemia Potassium low Normal renal function AST greater than ALT consistent with chronic alcohol abuse Magnesium 1.1 No white count Procalcitonin 0.38 ABG shows compensated metabolic acidosis Chest x-ray negative Ammonia 164 Ethyl alcohol level  175  Resolved Hospital Problem list   N/A  Assessment & Plan:  Recurrent hepatic encephalopathy in patient with alcoholic cirrhosis and ongoing alcohol abuse/intoxication Moderate protein calorie malnutrition present on admission Hypokalemia, hypomagnesemia due to poor nutrition and alcohol abuse Thrombocytopenia related to sequestration from cirrhosis Coagulopathy of cirrhosis  At risk Dts-ceribell performed and negative Lactic acidemia related to end-stage cirrhosis, question of seizure leading to the initial insult  Currently protecting airway, would place an NG tube and start her on lactulose and rifaximin  Will need close monitoring for Dts; CIWA  Thiamine, folate, Replete K/Mg  I note palliative has seen before, patient/husband are not interested in hospice  Will check on tomorrow to assure heading in right direction  Best Practice (right click and "Reselect all SmartList Selections" daily)  Per primary  Labs   CBC: Recent Labs  Lab 03/09/23 2229 03/10/23 0707  WBC 7.3 5.1  NEUTROABS  --  4.1  HGB 10.4* 8.7*  HCT 33.8* 28.6*  MCV 99.7 99.3  PLT 36* 22*    Basic Metabolic Panel: Recent Labs  Lab 03/09/23 2229 03/10/23 0058 03/10/23 0707  NA 140  --  140  K 2.5*  --  3.4*  CL 103  --  106  CO2 14*  --  14*  GLUCOSE 138*  --  107*  BUN 9  --  7  CREATININE 1.15*  --  0.64  CALCIUM 7.4*  --  7.1*  MG  --  1.2* 1.1*   GFR: Estimated Creatinine Clearance: 81 mL/min (by C-G formula based on SCr of 0.64 mg/dL). Recent Labs  Lab 03/09/23 2223 03/09/23 2229  03/10/23 0030 03/10/23 0707  PROCALCITON  --   --   --  0.38  WBC  --  7.3  --  5.1  LATICACIDVEN >9.0*  --  >9.0* 8.7*    Liver Function Tests: Recent Labs  Lab 03/09/23 2229 03/10/23 0707  AST 153* 162*  ALT 17 16  ALKPHOS 115 91  BILITOT 6.4* 5.9*  PROT 10.4* 8.3*  ALBUMIN 2.6* 2.2*   No results for input(s): "LIPASE", "AMYLASE" in the last 168 hours. Recent Labs  Lab  03/09/23 2316  AMMONIA 165*    ABG    Component Value Date/Time   PHART 7.47 (H) 03/10/2023 0835   PCO2ART 21 (L) 03/10/2023 0835   PO2ART 103 03/10/2023 0835   HCO3 14.9 (L) 03/10/2023 0835   TCO2 21 (L) 07/25/2022 0613   ACIDBASEDEF 7.4 (H) 03/10/2023 0835   O2SAT 99.9 03/10/2023 0835     Coagulation Profile: No results for input(s): "INR", "PROTIME" in the last 168 hours.  Cardiac Enzymes: Recent Labs  Lab 03/09/23 2310 03/10/23 0707  CKTOTAL 438* 561*    HbA1C: Hgb A1c MFr Bld  Date/Time Value Ref Range Status  09/29/2018 03:23 PM 4.8 4.8 - 5.6 % Final    Comment:             Prediabetes: 5.7 - 6.4          Diabetes: >6.4          Glycemic control for adults with diabetes: <7.0     CBG: Recent Labs  Lab 03/10/23 0214 03/10/23 0731  GLUCAP 106* 100*    Review of Systems:   Staring off into space and will not answer questions  Past Medical History:  She,  has a past medical history of [redacted] weeks gestation of pregnancy (03/22/2020), Alcohol abuse, Alcohol withdrawal delirium (HCC) (02/15/2022), Alcohol-induced acute pancreatitis, Alcoholic ketoacidosis (08/28/2018), Anxiety, DTs (delirium tremens) (HCC), Elevated LFTs (10/24/2018), Encephalopathy acute, Hepatic encephalopathy (HCC) (12/24/2019), Hypertension, Hypokalemia (04/02/2019), Hypotension (02/24/2022), and Liver disease.   Surgical History:   Past Surgical History:  Procedure Laterality Date   ECTOPIC PREGNANCY SURGERY     ESOPHAGEAL BANDING  07/25/2022   Procedure: ESOPHAGEAL BANDING;  Surgeon: Meridee Score Netty Starring., MD;  Location: [facility];  Service: Gastroenterology;;   ESOPHAGEAL BANDING  08/30/2022   Procedure: ESOPHAGEAL BANDING;  Surgeon: Lemar Lofty., MD;  Location: [facility];  Service: Gastroenterology;;   ESOPHAGOGASTRODUODENOSCOPY (EGD) WITH PROPOFOL N/A 12/27/2019   Procedure: ESOPHAGOGASTRODUODENOSCOPY (EGD) WITH PROPOFOL;  Surgeon: Jeani Hawking, MD;  Location[facility]  ENDOSCOPY;  Service: Endoscopy;  Laterality: N/A;   ESOPHAGOGASTRODUODENOSCOPY (EGD) WITH PROPOFOL N/A 06/26/2022   Procedure: ESOPHAGOGASTRODUODENOSCOPY (EGD) WITH PROPOFOL;  Surgeon: Shellia Cleverly, DO;  Location: [facility] ENDOSCOPY;  Service: Gastroenterology;  Laterality: N/A;   ESOPHAGOGASTRODUODENOSCOPY (EGD) WITH PROPOFOL N/A 07/25/2022   Procedure: ESOPHAGOGASTRODUODENOSCOPY (EGD) WITH PROPOFOL;  Surgeon: Meridee Score Netty Starring., MD;  Location: [facility] END[facility]COPY;  Service: Gastroenterology;  Laterality: N/A;   ESOPHAGOGASTRODUODENOSCOPY (EGD) WITH PROPOFOL N/A 08/30/2022   Procedure: ESOPHAGOGASTRODUODENOSCOPY (EGD) WITH PROPOFOL;  Surgeon: Meridee Score Netty Starring., MD;  Location: [facility] ENDOSCOPY;  Service: Gastroenterology;  Laterality: N/A;   HEMOSTASIS CLIP PLACEMENT  07/25/2022   Procedure: HEMOSTASIS CLIP PLACEMENT;  Surgeon: Lemar Lofty., MD;  Location: [facility] ENDOSCOPY;  Service: Gastroenterology;;   HOT HEMOSTASIS N/A 07/25/2022   Procedure: HOT HEMOSTASIS (ARGON PLASMA COAGULATION/BICAP);  Surgeon: Lemar Lofty., MD;  Location: [facility];  Service: Gastroenterology;  Laterality: N/A;   IR RADIOLOGIST EVAL & MGMT  08/13/2022  Social History:   reports that she has never smoked. She has never used smokeless tobacco. She reports current alcohol use. She reports that she does not use drugs.   Family History:  Her family history includes Alcohol abuse in her father and mother; Diabetes in her father; Healthy in her brother, brother, brother, and mother; Hypertension in her maternal grandfather and maternal grandmother.   Allergies Allergies  Allergen Reactions   Latex Hives and Other (See Comments)    BURNS THE SKIN   Tape Other (See Comments)    NO TAPES THAT CONTAIN LATEX- burns the skin     Home Medications  Prior to Admission medications   Medication Sig Start Date End Date Taking? Authorizing Provider  disulfiram (ANTABUSE) 250 MG tablet Take 500 mg by  mouth daily. 02/28/23 05/29/23 Yes [provider]  folic acid (FOLVITE) 1 MG tablet Take 1 tablet (1 mg total) by mouth daily. 01/25/23  Yes Alwyn Ren, MD  lactulose (CHRONULAC) 10 GM/15ML solution Take 45 mLs (30 g total) by mouth 3 (three) times daily. 01/25/23  Yes Alwyn Ren, MD  Multiple Vitamin (MULTIVITAMIN WITH MINERALS) TABS tablet Take 1 tablet by mouth daily. Patient taking differently: Take 1 tablet by mouth daily with breakfast. 01/15/20  Yes Angiulli, Mcarthur Rossetti, PA-C  ondansetron (ZOFRAN) 4 MG tablet Take 1 tablet (4 mg total) by mouth every 6 (six) hours as needed for nausea. 01/25/23  Yes Alwyn Ren, MD  pantoprazole (PROTONIX) 40 MG tablet Take 1 tablet (40 mg total) by mouth 2 (two) times daily. 01/25/23  Yes Alwyn Ren, MD  potassium chloride SA (KLOR-CON M) 20 MEQ tablet Take 2 tablets (40 mEq total) by mouth daily. 01/25/23  Yes Alwyn Ren, MD  rifaximin (XIFAXAN) 550 MG TABS tablet Take 1 tablet (550 mg total) by mouth 2 (two) times daily. 01/25/23  Yes Alwyn Ren, MD  thiamine (VITAMIN B-1) 100 MG tablet Take 1 tablet (100 mg total) by mouth daily. 01/25/23  Yes Alwyn Ren, MD     Critical care time: N/A

## 2023-03-10 NOTE — ED Notes (Signed)
ED TO INPATIENT HANDOFF REPORT  ED Nurse Name and Phone #: Huntley Dec [phone]  S Name/Age/Gender Joann [name] 42 y.o. female Room/Bed: WA19/WA19  Code Status   Code Status: Full Code     Triage Complete: Triage complete  Chief Complaint Acute encephalopathy [G93.40]  Triage Note BIBA - came from home. Per EMS - Husband states she started detoxing from alcohol on Wednesday/Thursday. He came home today and states she was altered and couldn't talk. Pt got 5 mg of versed IM in route due to increased agitation. Hx of liver cirrhosis.    Allergies Allergies  Allergen Reactions   Latex Hives and Other (See Comments)    BURNS THE SKIN   Tape Other (See Comments)    NO TAPES THAT CONTAIN LATEX- burns the skin    Level of Care/Admitting Diagnosis ED Disposition     ED Disposition  Admit   Condition  --   Comment  Hospital Area: [facility] [[id number]]  Level of Care: Stepdown [14]  Admit to SDU based on following criteria: Severe physiological/psychological symptoms:  Any diagnosis requiring assessment & intervention at least every 4 hours on an ongoing basis to obtain desired patient outcomes including stability and rehabilitation  May admit patient to [facility] or [facility] if equivalent level of care is available:: No  Covid Evaluation: Asymptomatic - no recent exposure (last 10 days) testing not required  Diagnosis: Acute encephalopathy [147829]  Admitting Physician: Angie Fava [[id number]]  Attending Physician: Angie Fava [[id number]]  Certification:: I certify this patient will need inpatient services for at least 2 midnights          B Medical/Surgery History Past Medical History:  Diagnosis Date   [redacted] weeks gestation of pregnancy 03/22/2020   Alcohol abuse    Alcohol withdrawal delirium (HCC) 02/15/2022   Alcohol-induced acute pancreatitis    Alcoholic ketoacidosis 08/28/2018   Anxiety    DTs (delirium tremens) (HCC)    Elevated LFTs  10/24/2018   Encephalopathy acute    Hepatic encephalopathy (HCC) 12/24/2019   Hypertension    Hypokalemia 04/02/2019   Hypotension 02/24/2022   Liver disease    Past Surgical History:  Procedure Laterality Date   ECTOPIC PREGNANCY SURGERY     ESOPHAGEAL BANDING  07/25/2022   Procedure: ESOPHAGEAL BANDING;  Surgeon: Lemar Lofty., MD;  Location: [facility];  Service: Gastroenterology;;   ESOPHAGEAL BANDING  08/30/2022   Procedure: ESOPHAGEAL BANDING;  Surgeon: Lemar Lofty., MD;  Location: [facility];  Service: Gastroenterology;;   ESOPHAGOGASTRODUODENOSCOPY (EGD) WITH PROPOFOL N/A 12/27/2019   Procedure: ESOPHAGOGASTRODUODENOSCOPY (EGD) WITH PROPOFOL;  Surgeon: Jeani Hawking, MD;  Location: [facility];  Service: Endoscopy;  Laterality: N/A;   ESOPHAGOGASTRODUODENOSCOPY (EGD) WITH PROPOFOL N/A 06/26/2022   Procedure: ESOPHAGOGASTRODUODENOSCOPY (EGD) WITH PROPOFOL;  Surgeon: Shellia Cleverly, DO;  Location: [facility];  Service: Gastroenterology;  Laterality: N/A;   ESOPHAGOGASTRODUODENOSCOPY (EGD) WITH PROPOFOL N/A 07/25/2022   Procedure: ESOPHAGOGASTRODUODENOSCOPY (EGD) WITH PROPOFOL;  Surgeon: Meridee Score Netty Starring., MD;  Location: [facility];  Service: Gastroenterology;  Laterality: N/A;   ESOPHAGOGASTRODUODENOSCOPY (EGD) WITH PROPOFOL N/A 08/30/2022   Procedure: ESOPHAGOGASTRODUODENOSCOPY (EGD) WITH PROPOFOL;  Surgeon: Meridee Score Netty Starring., MD;  Location: [facility];  Service: Gastroenterology;  Laterality: N/A;   HEMOSTASIS CLIP PLACEMENT  07/25/2022   Procedure: HEMOSTASIS CLIP PLACEMENT;  Surgeon: Lemar Lofty., MD;  Location: [facility];  Service: Gastroenterology;;   HOT HEMOSTASIS N/A 07/25/2022   Procedure: HOT HEMOSTASIS (ARGON PLASMA COAGULATION/BICAP);  Surgeon:  Mansouraty, Netty Starring., MD;  Location: [facility];  Service: Gastroenterology;  Laterality: N/A;   IR RADIOLOGIST EVAL & MGMT  08/13/2022     A IV  Location/Drains/Wounds Patient Lines/Drains/Airways Status     Active Line/Drains/Airways     Name Placement date Placement time Site Days   Peripheral IV 03/09/23 20 G 1" Left Antecubital 03/09/23  2225  Antecubital  1   Peripheral IV 03/10/23 22 G 1" Anterior;Right Forearm 03/10/23  0130  Forearm  less than 1            Intake/Output Last 24 hours  Intake/Output Summary (Last 24 hours) at 03/10/2023 1610 Last data filed at 03/10/2023 0548 Gross per 24 hour  Intake 2750 ml  Output --  Net 2750 ml    Labs/Imaging Results for orders placed or performed during the hospital encounter of 03/09/23 (from the past 48 hour(s))  Lactic acid, plasma     Status: Abnormal   Collection Time: 03/09/23 10:23 PM  Result Value Ref Range   Lactic Acid, Venous >9.0 (HH) 0.5 - 1.9 mmol/L    Comment: CRITICAL RESULT CALLED TO, READ BACK BY AND VERIFIED WITH  FEVRIAER C. RN @ 0051 03/10/23 MCLEAN K. Performed at [facility], [address], [place], [place] [address]   Comprehensive metabolic panel     Status: Abnormal   Collection Time: 03/09/23 10:29 PM  Result Value Ref Range   Sodium 140 135 - 145 mmol/L   Potassium 2.5 (LL) 3.5 - 5.1 mmol/L    Comment: CRITICAL RESULT CALLED TO, READ BACK BY AND VERIFIED WITH&R& FEVRIAER C. RN @0051  03/10/23 MCLEAN K.    Chloride 103 98 - 111 mmol/L   CO2 14 (L) 22 - 32 mmol/L   Glucose, Bld 138 (H) 70 - 99 mg/dL    Comment: Glucose reference range applies only to samples taken after fasting for at least 8 hours.   BUN 9 6 - 20 mg/dL   Creatinine, Ser 4.09 (H) 0.44 - 1.00 mg/dL   Calcium 7.4 (L) 8.9 - 10.3 mg/dL   Total Protein 81.1 (H) 6.5 - 8.1 g/dL   Albumin 2.6 (L) 3.5 - 5.0 g/dL   AST 914 (H) 15 - 41 U/L   ALT 17 0 - 44 U/L   Alkaline Phosphatase 115 38 - 126 U/L   Total Bilirubin 6.4 (H) 0.3 - 1.2 mg/dL   GFR, Estimated >78 >29 mL/min    Comment: (NOTE) Calculated using the CKD-EPI Creatinine Equation (2021)    Anion  gap 23 (H) 5 - 15    Comment: Performed at [facility], [address] [place], [place] [address]  Ethanol     Status: Abnormal   Collection Time: 03/09/23 10:29 PM  Result Value Ref Range   Alcohol, Ethyl (B) 175 (H) <10 mg/dL    Comment: (NOTE) Lowest detectable limit for serum alcohol is 10 mg/dL.  For medical purposes only. Performed at [facility], [address], [place], [place] [address]   cbc     Status: Abnormal   Collection Time: 03/09/23 10:29 PM  Result Value Ref Range   WBC 7.3 4.0 - 10.5 K/uL   RBC 3.39 (L) 3.87 - 5.11 MIL/uL   Hemoglobin 10.4 (L) 12.0 - 15.0 g/dL   HCT 84.6 (L) 96.2 - 95.2 %   MCV 99.7 80.0 - 100.0 fL   MCH 30.7 26.0 - 34.0 pg   MCHC 30.8 30.0 - 36.0 g/dL  RDW 21.0 (H) 11.5 - 15.5 %   Platelets 36 (L) 150 - 400 K/uL    Comment: SPECIMEN CHECKED FOR CLOTS Immature Platelet Fraction may be clinically indicated, consider ordering this additional test [id number] PLATELET COUNT CONFIRMED BY SMEAR GREY, S RN @ 1248 6    nRBC 0.0 0.0 - 0.2 %    Comment: Performed at [facility], [address] [place], [place] [address]  CK     Status: Abnormal   Collection Time: 03/09/23 11:10 PM  Result Value Ref Range   Total CK 438 (H) 38 - 234 U/L    Comment: Performed at [facility], [address] [place], [place] [address]  Ammonia     Status: Abnormal   Collection Time: 03/09/23 11:16 PM  Result Value Ref Range   Ammonia 165 (H) 9 - 35 umol/L    Comment: Performed at [facility], [address] [place], [place] [address]  Urinalysis, Routine w reflex microscopic -Urine, Clean Catch     Status: Abnormal   Collection Time: 03/10/23 12:21 AM  Result Value Ref Range   Color, Urine AMBER (A) YELLOW    Comment: BIOCHEMICALS MAY BE AFFECTED BY COLOR   APPearance CLEAR CLEAR   Specific Gravity, Urine 1.014 1.005 - 1.030   pH 7.0 5.0 - 8.0   Glucose, UA  NEGATIVE NEGATIVE mg/dL   Hgb urine dipstick NEGATIVE NEGATIVE   Bilirubin Urine NEGATIVE NEGATIVE   Ketones, ur 20 (A) NEGATIVE mg/dL   Protein, ur 30 (A) NEGATIVE mg/dL   Nitrite NEGATIVE NEGATIVE   Leukocytes,Ua NEGATIVE NEGATIVE   RBC / HPF 0-5 0 - 5 RBC/hpf   WBC, UA 6-10 0 - 5 WBC/hpf   Bacteria, UA RARE (A) NONE SEEN   Squamous Epithelial / HPF 0-5 0 - 5 /HPF   Hyaline Casts, UA PRESENT     Comment: Performed at [facility], [address], [place], [place] [address]  Lactic acid, plasma     Status: Abnormal   Collection Time: 03/10/23 12:30 AM  Result Value Ref Range   Lactic Acid, Venous >9.0 (HH) 0.5 - 1.9 mmol/L    Comment: CRITICAL VALUE NOTED. VALUE IS CONSISTENT WITH PREVIOUSLY REPORTED/CALLED VALUE Performed at [facility], [address], [place], [place] [address]   Magnesium     Status: Abnormal   Collection Time: 03/10/23 12:58 AM  Result Value Ref Range   Magnesium 1.2 (L) 1.7 - 2.4 mg/dL    Comment: Performed at [facility], [address], [place], [place] [address]  Pregnancy, urine     Status: None   Collection Time: 03/10/23  1:53 AM  Result Value Ref Range   Preg Test, Ur NEGATIVE NEGATIVE    Comment:        THE SENSITIVITY OF THIS METHODOLOGY IS >25 mIU/mL. Performed at [facility], [address], [place], [place] [address]   POC CBG, ED     Status: Abnormal   Collection Time: 03/10/23  2:14 AM  Result Value Ref Range   Glucose-Capillary 106 (H) 70 - 99 mg/dL    Comment: Glucose reference range applies only to samples taken after fasting for at least 8 hours.  Blood gas, venous (at [facility] and [facility])     Status: Abnormal   Collection Time: 03/10/23  2:32 AM  Result Value Ref Range   pH, Ven 7.36 7.25 - 7.43   pCO2, Ven  25 (L) 44 - 60 mmHg   pO2, Ven 58 (H) 32 - 45 mmHg   Bicarbonate 14.1 (L) 20.0 - 28.0 mmol/L   Acid-base deficit 9.5 (H) 0.0 - 2.0 mmol/L   O2 Saturation  83.1 %   Patient temperature 37.0     Comment: Performed at [facility], [address], [place], [place] [address]   CT CHEST ABDOMEN PELVIS W CONTRAST  Result Date: 03/10/2023 CLINICAL DATA:  Altered mentation with suspected sepsis. EXAM: CT CHEST, ABDOMEN, AND PELVIS WITH CONTRAST TECHNIQUE: Multidetector CT imaging of the chest, abdomen and pelvis was performed following the standard protocol during bolus administration of intravenous contrast. RADIATION DOSE REDUCTION: This exam was performed according to the departmental dose-optimization program which includes automated exposure control, adjustment of the mA and/or kV according to patient size and/or use of iterative reconstruction technique. CONTRAST:  OMNIPAQUE IOHEXOL 300 MG/ML  SOLN COMPARISON:  CT abdomen and pelvis with IV contrast 10/31/2022. Portable chest today, portable chest 01/21/2023. No prior chest CT for comparison. FINDINGS: CT CHEST FINDINGS Cardiovascular: No significant vascular findings. Normal heart size. No pericardial effusion. Mediastinum/Nodes: Small hiatal hernia. 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 There is still mild wall thickening in the distal esophagus but less than previously. Rest of the esophagus is unremarkable. No intrathoracic or axillary adenopathy or thyroid lesion are seen. The trachea and central airways are clear. Lungs/Pleura: There is infrahilar airspace disease in the left lower lobe consistent with pneumonia or aspiration. Rest of the lungs are clear. There is no pleural effusion, thickening or pneumothorax. Mild chronic elevation right diaphragm. Musculoskeletal: No chest wall mass or suspicious bone lesions identified. CT ABDOMEN PELVIS FINDINGS Hepatobiliary: A TIPS stent is again noted from the middle hepatic vein into the main portal vein and is patent with contrast opacification.  Calcified granuloma again is noted in the liver dome. Liver cirrhotic and heterogeneous but without a circumscribable mass. There are several scattered tiny hepatic hypodensities which are too small to characterize. The liver is mildly steatotic, with significantly improved steatosis noted since 10/31/2022. In again noted cholecystectomy changes without biliary dilatation. Pancreas: There are coarse parenchymal calcifications in the head and uncinate process of the pancreas consistent with chronic calcific pancreatitis. No acute pancreatic inflammatory changes are seen, no mass enhancement, fluid collections or ductal dilatation. Spleen: Upper limit of normal in size.  No mass enhancement. Adrenals/Urinary Tract: There is no adrenal mass. There is a 2 mm nonobstructive caliceal stone in the superior pole of the right kidney. No nephrolithiasis on the left. There are stable small cysts in both kidneys. No follow-up imaging is recommended. There is subtle patchy hypoenhancement over portions of the lower poles of both kidneys. Findings most likely are due to pyelonephritis and not seen previously. There is no hydronephrosis or ureteral stone. Mild bladder thickening is noted versus nondistention. Stomach/Bowel: The gastric wall is contracted. The unopacified small bowel is unremarkable. The appendix is normal in caliber. There is wall thickening versus nondistention in the descending and sigmoid colon. Vascular/Lymphatic: TIPSS stent as above. The aorta is unremarkable. The main portal vein is upper limit of normal in caliber but unchanged. No lymphadenopathy is seen. Reproductive: Uterus and bilateral adnexa are unremarkable. Other: No free fluid, free hemorrhage or free air. Small umbilical fat hernia. Musculoskeletal: No acute or significant osseous findings. IMPRESSION: 1. Left lower lobe infrahilar airspace disease consistent with pneumonia  or aspiration. Follow-up study recommended to ensure clearing after  treatment. 2. Small hiatal hernia. There is still mild wall thickening in the distal esophagus but less than previously. This could be due to esophagitis or reflux. Endoscopy may be indicated. 3. Cirrhotic liver with mild hepatic steatosis. There are several scattered tiny hypodensities which are too small to characterize. The steatosis has improved since 10/31/2022. 4. Patent TIPS stent. 5. Chronic calcific pancreatitis. No findings of acute pancreatitis. 6. Nonobstructive nephrolithiasis. 7. Patchy hypoenhancement of the lower pole of both kidneys most likely due to pyelonephritis. 8. Cystitis versus bladder nondistention. 9. Descending and sigmoid colitis versus nondistention. 10. Small umbilical fat hernia. Electronically Signed   By: Almira Bar M.D.   On: 03/10/2023 04:39   DG Chest Portable 1 View  Result Date: 03/10/2023 CLINICAL DATA:  Initial evaluation for delirium. EXAM: PORTABLE CHEST 1 VIEW COMPARISON:  Prior study from 01/21/2023. FINDINGS: Cardiomegaly, stable.  Mediastinal silhouette within normal limits. Mild elevation of the right hemidiaphragm. No focal infiltrates. No edema or effusion. No pneumothorax. Visualized soft tissues and osseous structures demonstrate no acute finding. IMPRESSION: 1. No radiographic evidence for active cardiopulmonary disease. 2. Cardiomegaly. Electronically Signed   By: Rise Mu M.D.   On: 03/10/2023 00:14   CT HEAD WO CONTRAST ( )  Result Date: 03/10/2023 CLINICAL DATA:  Initial evaluation for delirium. EXAM: CT HEAD WITHOUT CONTRAST TECHNIQUE: Contiguous axial images were obtained from the base of the skull through the vertex without intravenous contrast. RADIATION DOSE REDUCTION: This exam was performed according to the departmental dose-optimization program which includes automated exposure control, adjustment of the mA and/or kV according to patient size and/or use of iterative reconstruction technique. COMPARISON:  CT from 10/31/2022.  FINDINGS: Brain: Examination limited by poor positioning. No acute intracranial hemorrhage. No acute large vessel territory infarct. No mass lesion or midline shift. No hydrocephalus. No visible extra-axial fluid collection. Vascular: No visible abnormal hyperdense vessel. Skull: Scalp soft tissues demonstrate no acute finding. Calvarium intact. Sinuses/Orbits: Globes and orbital soft tissues grossly within normal limits. Visualized paranasal sinuses and mastoid air cells are clear. Other: None. IMPRESSION: 1. Technically limited exam due to poor positioning. 2. No acute intracranial abnormality identified. Electronically Signed   By: Rise Mu M.D.   On: 03/10/2023 00:12    Pending Labs Unresulted Labs (From admission, onward)     Start     Ordered   03/10/23 0655  C-reactive protein  ONCE - URGENT,   URGENT        03/10/23 0654   03/10/23 0655  Blood gas, arterial  ONCE - STAT,   R        03/10/23 0654   03/10/23 0655  CK  ONCE - URGENT,   URGENT        03/10/23 0654   03/10/23 0655  CBC with Differential/Platelet  ONCE - STAT,   STAT        03/10/23 0654   03/10/23 0654  Lactic acid, plasma  STAT Now then every 3 hours,   R      03/10/23 0654   03/10/23 0654  Procalcitonin  Once,   R       References:    Procalcitonin Lower Respiratory Tract Infection AND Sepsis Procalcitonin Algorithm   03/10/23 0654   03/10/23 0516  CBC with Differential/Platelet  Once,   R        03/10/23 0515   03/10/23 0516  Comprehensive metabolic panel  Once,   R  03/10/23 0515   03/10/23 0516  Magnesium  Once,   R        03/10/23 0515   03/10/23 0057  Blood culture (routine x 2)  BLOOD CULTURE X 2,   R      03/10/23 0056   03/09/23 2229  Rapid urine drug screen (hospital performed)  Once,   STAT        03/09/23 2228            Vitals/Pain Today's Vitals   03/10/23 0530 03/10/23 0545 03/10/23 0630 03/10/23 0641  BP: (!) 160/86  (!) 147/87   Pulse: (!) 135 (!) 136 (!) 134   Resp: (!)  32 (!) 24 (!) 29   Temp:    99.8 F (37.7 C)  TempSrc:    Axillary  SpO2: 99% 97% 97%   Weight:      Height:        Isolation Precautions No active isolations  Medications Medications  lactated ringers infusion ( Intravenous New Bag/Given 03/10/23 0401)  acetaminophen (TYLENOL) tablet 650 mg (has no administration in time range)    Or  acetaminophen (TYLENOL) suppository 650 mg (has no administration in time range)  melatonin tablet 3 mg (has no administration in time range)  ondansetron (ZOFRAN) injection 4 mg (has no administration in time range)  metroNIDAZOLE (FLAGYL) IVPB 500 mg (has no administration in time range)  LORazepam (ATIVAN) tablet 1-4 mg (has no administration in time range)    Or  LORazepam (ATIVAN) injection 1-4 mg (has no administration in time range)  thiamine (VITAMIN B1) tablet 100 mg (has no administration in time range)    Or  thiamine (VITAMIN B1) injection 100 mg (has no administration in time range)  folic acid (FOLVITE) tablet 1 mg (has no administration in time range)  multivitamin with minerals tablet 1 tablet (has no administration in time range)  ceFEPIme (MAXIPIME) 2 g in sodium chloride 0.9 % 100 mL IVPB (has no administration in time range)  lactated ringers bolus 1,000 mL (0 mLs Intravenous Stopped 03/10/23 0016)  lactated ringers bolus 1,000 mL (0 mLs Intravenous Stopped 03/10/23 0246)    And  lactated ringers bolus 250 mL (0 mLs Intravenous Stopped 03/10/23 0330)  ceFEPIme (MAXIPIME) 2 g in sodium chloride 0.9 % 100 mL IVPB (0 g Intravenous Stopped 03/10/23 0212)  metroNIDAZOLE (FLAGYL) IVPB 500 mg (0 mg Intravenous Stopped 03/10/23 0413)  vancomycin (VANCOCIN) IVPB 1000 mg/200 mL premix (0 mg Intravenous Stopped 03/10/23 0546)  potassium chloride 10 mEq in 100 mL IVPB (0 mEq Intravenous Stopped 03/10/23 0548)  iohexol (OMNIPAQUE) 300 MG/ML solution 100 mL (100 mLs Intravenous Contrast Given 03/10/23 0334)    Mobility non-ambulatory      Focused Assessments    R Recommendations: See Admitting Provider Note  Report given to:   Additional Notes: GCS 8

## 2023-03-10 NOTE — Progress Notes (Signed)
Pharmacy Antibiotic Note  AMANEE TO is a 42 y.o. female admitted on 03/09/2023 with for altered mental status. Patient with history of alcoholic cirrhosis, relapsed with alcohol use recently but stopped drinking alcohol abruptly 48 hours ago. Pharmacy has been consulted to dose cefepime for sepsis.  Plan: Cefepime 2gm IV q12h Follow renal function, cultures and clinical course  Height: 5\' 1"  (154.9 cm) Weight: 66.7 kg (147 lb 1.6 oz) IBW/kg (Calculated) : 47.8  Temp (24hrs), Avg:98.5 F (36.9 C), Min:98.2 F (36.8 C), Max:98.7 F (37.1 C)  Recent Labs  Lab 03/09/23 2223 03/09/23 2229 03/10/23 0030  WBC  --  7.3  --   CREATININE  --  1.15*  --   LATICACIDVEN >9.0*  --  >9.0*    Estimated Creatinine Clearance: 55.2 mL/min (A) (by C-G formula based on SCr of 1.15 mg/dL (H)).    Allergies  Allergen Reactions   Latex Hives and Other (See Comments)    BURNS THE SKIN   Tape Other (See Comments)    NO TAPES THAT CONTAIN LATEX- burns the skin    Antimicrobials this admission: 6/30 cefepime >> 6/30 flagyl >> 6/30 vanc  x 1  Dose adjustments this admission:   Microbiology results: 6/30 BCx:   Thank you for allowing pharmacy to be a part of this patient's care.  Arley Phenix RPh 03/10/2023, 5:22 AM

## 2023-03-10 NOTE — Progress Notes (Signed)
rawal; please see my progress note for additional details).   Carryover admission to the Day Admitter.  I discussed this case with the EDP, Loleta Dicker, PA.  Per these discussions:   This is a 42 year old female with chronic alcohol abuse, cirrhosis status post TIPS procedure, who is being admitted with acute encephalopathy, concern for aspiration as well as concern for alcohol withdrawal.  She is brought in by has been noted the patient to be confused over the course of the last day.  No evidence of seizure-like activity.  Has been conveys that the patient completely quit alcohol 72 hours ago.  He has been tachycardic throughout her ED course, with multiple electrolyte abnormalities, including hypokalemia, hypomagnesemia.  She also has an ammonia level of 165, although I am unsure as to how this relates to her baseline ammonia level.  Chest x-ray suggestive of left-sided infiltrate as well as perihilar infiltrate and distribution that was read as concerning for aspiration pneumonia.  Her urinalysis is reported to be not consistent with UTI.  She received IV vancomycin, cefepime, and Flagyl in the ED.  I have placed an order for inpatient admission to PCU for further evaluation management of the above.  I have placed some additional preliminary admit orders via the adult multi-morbid admission order set. I have also ordered continuation of cefepime and Flagyl.-Initiated CIWA protocol, including prn symptomatic based Ativan administration, TOC consultation, and daily thiamine, folic acid, and multivitamin supplementation.  Have ordered morning labs in the form of CMP, CBC, serum magnesium level.    Newton Pigg, DO Hospitalist

## 2023-03-10 NOTE — Progress Notes (Signed)
Notified lab abg was sent 

## 2023-03-11 ENCOUNTER — Inpatient Hospital Stay (HOSPITAL_COMMUNITY): Payer: Medicaid Other

## 2023-03-11 DIAGNOSIS — E8809 Other disorders of plasma-protein metabolism, not elsewhere classified: Secondary | ICD-10-CM | POA: Diagnosis not present

## 2023-03-11 DIAGNOSIS — K920 Hematemesis: Secondary | ICD-10-CM

## 2023-03-11 DIAGNOSIS — K7030 Alcoholic cirrhosis of liver without ascites: Secondary | ICD-10-CM

## 2023-03-11 DIAGNOSIS — F1010 Alcohol abuse, uncomplicated: Secondary | ICD-10-CM

## 2023-03-11 DIAGNOSIS — D689 Coagulation defect, unspecified: Secondary | ICD-10-CM

## 2023-03-11 DIAGNOSIS — R4182 Altered mental status, unspecified: Secondary | ICD-10-CM | POA: Diagnosis not present

## 2023-03-11 DIAGNOSIS — K7682 Hepatic encephalopathy: Secondary | ICD-10-CM

## 2023-03-11 DIAGNOSIS — D62 Acute posthemorrhagic anemia: Secondary | ICD-10-CM

## 2023-03-11 DIAGNOSIS — D696 Thrombocytopenia, unspecified: Secondary | ICD-10-CM

## 2023-03-11 LAB — BLOOD CULTURE ID PANEL (REFLEXED) - BCID2

## 2023-03-11 LAB — COMPREHENSIVE METABOLIC PANEL
ALT: 15 U/L (ref 0–44)
AST: 135 U/L — ABNORMAL HIGH (ref 15–41)
Albumin: 3.1 g/dL — ABNORMAL LOW (ref 3.5–5.0)
Alkaline Phosphatase: 76 U/L (ref 38–126)
Anion gap: 12 (ref 5–15)
BUN: 6 mg/dL (ref 6–20)
CO2: 20 mmol/L — ABNORMAL LOW (ref 22–32)
Calcium: 8 mg/dL — ABNORMAL LOW (ref 8.9–10.3)
Chloride: 119 mmol/L — ABNORMAL HIGH (ref 98–111)
Creatinine, Ser: 0.76 mg/dL (ref 0.44–1.00)
GFR, Estimated: >60 mL/min (ref >60)
Glucose, Bld: 105 mg/dL — ABNORMAL HIGH (ref 70–99)
Potassium: 2.9 mmol/L — ABNORMAL LOW (ref 3.5–5.1)
Sodium: 151 mmol/L — ABNORMAL HIGH (ref 135–145)
Total Bilirubin: 7.1 mg/dL — ABNORMAL HIGH (ref 0.3–1.2)
Total Protein: 8.6 g/dL — ABNORMAL HIGH (ref 6.5–8.1)

## 2023-03-11 LAB — CBC WITH DIFFERENTIAL/PLATELET
Abs Immature Granulocytes: 0.02 10*3/uL (ref 0.00–0.07)
Basophils Absolute: 0 10*3/uL (ref 0.0–0.1)
Basophils Relative: 0 %
Eosinophils Absolute: 0 10*3/uL (ref 0.0–0.5)
Eosinophils Relative: 0 %
HCT: 27.1 % — ABNORMAL LOW (ref 36.0–46.0)
Hemoglobin: 7.9 g/dL — ABNORMAL LOW (ref 12.0–15.0)
Immature Granulocytes: 1 %
Lymphocytes Relative: 15 %
Lymphs Abs: 0.5 10*3/uL — ABNORMAL LOW (ref 0.7–4.0)
MCH: 30.6 pg (ref 26.0–34.0)
MCHC: 29.2 g/dL — ABNORMAL LOW (ref 30.0–36.0)
MCV: 105 fL — ABNORMAL HIGH (ref 80.0–100.0)
Monocytes Absolute: 0.3 10*3/uL (ref 0.1–1.0)
Monocytes Relative: 9 %
Neutro Abs: 2.6 10*3/uL (ref 1.7–7.7)
Neutrophils Relative %: 75 %
Platelets: 14 10*3/uL — CL (ref 150–400)
RBC: 2.58 MIL/uL — ABNORMAL LOW (ref 3.87–5.11)
RDW: 21.2 % — ABNORMAL HIGH (ref 11.5–15.5)
WBC: 3.4 10*3/uL — ABNORMAL LOW (ref 4.0–10.5)
nRBC: 0 % (ref 0.0–0.2)

## 2023-03-11 LAB — BASIC METABOLIC PANEL
Anion gap: 11 (ref 5–15)
Anion gap: 9 (ref 5–15)
BUN: 5 mg/dL — ABNORMAL LOW (ref 6–20)
BUN: 5 mg/dL — ABNORMAL LOW (ref 6–20)
CO2: 19 mmol/L — ABNORMAL LOW (ref 22–32)
CO2: 18 mmol/L — ABNORMAL LOW (ref 22–32)
Calcium: 8 mg/dL — ABNORMAL LOW (ref 8.9–10.3)
Calcium: 8 mg/dL — ABNORMAL LOW (ref 8.9–10.3)
Chloride: 120 mmol/L — ABNORMAL HIGH (ref 98–111)
Chloride: 124 mmol/L — ABNORMAL HIGH (ref 98–111)
Creatinine, Ser: 0.71 mg/dL (ref 0.44–1.00)
Creatinine, Ser: 0.66 mg/dL (ref 0.44–1.00)
GFR, Estimated: >60 mL/min (ref >60)
GFR, Estimated: >60 mL/min (ref >60)
Glucose, Bld: 87 mg/dL (ref 70–99)
Glucose, Bld: 92 mg/dL (ref 70–99)
Potassium: 2.8 mmol/L — ABNORMAL LOW (ref 3.5–5.1)
Potassium: 3.4 mmol/L — ABNORMAL LOW (ref 3.5–5.1)
Sodium: 150 mmol/L — ABNORMAL HIGH (ref 135–145)
Sodium: 151 mmol/L — ABNORMAL HIGH (ref 135–145)

## 2023-03-11 LAB — PROTIME-INR
INR: 2.5 — ABNORMAL HIGH (ref 0.8–1.2)
Prothrombin Time: 27.5 seconds — ABNORMAL HIGH (ref 11.4–15.2)

## 2023-03-11 LAB — BLOOD GAS, ARTERIAL
Acid-base deficit: 7.4 mmol/L — ABNORMAL HIGH (ref 0.0–2.0)
Bicarbonate: 14.9 mmol/L — ABNORMAL LOW (ref 20.0–28.0)
Patient temperature: 36.7
pCO2 arterial: 21 mmHg — ABNORMAL LOW (ref 32–48)
pO2, Arterial: 103 mmHg (ref 83–108)

## 2023-03-11 LAB — HEMOGLOBIN AND HEMATOCRIT, BLOOD
HCT: 28.6 % — ABNORMAL LOW (ref 36.0–46.0)
Hemoglobin: 8.5 g/dL — ABNORMAL LOW (ref 12.0–15.0)

## 2023-03-11 LAB — PREPARE PLATELET PHERESIS

## 2023-03-11 LAB — BPAM PLATELET PHERESIS
Blood Product Expiration Date: 202407022359
Unit Type and Rh: 5100

## 2023-03-11 LAB — MAGNESIUM: Magnesium: 2.5 mg/dL — ABNORMAL HIGH (ref 1.7–2.4)

## 2023-03-11 LAB — CULTURE, BLOOD (ROUTINE X 2)

## 2023-03-11 LAB — AMMONIA: Ammonia: 86 umol/L — ABNORMAL HIGH (ref 9–35)

## 2023-03-11 LAB — PREPARE FRESH FROZEN PLASMA

## 2023-03-11 MED ORDER — MAGNESIUM SULFATE 2 GM/50ML IV SOLN
2.0000 g | Freq: Once | INTRAVENOUS | Status: AC
  Administered 2023-03-11: 2 g via INTRAVENOUS
  Filled 2023-03-11: qty 50

## 2023-03-11 MED ORDER — PHENOBARBITAL SODIUM 65 MG/ML IJ SOLN
65.0000 mg | Freq: Three times a day (TID) | INTRAMUSCULAR | Status: AC
  Administered 2023-03-13 – 2023-03-15 (×5): 65 mg via INTRAVENOUS
  Filled 2023-03-11 (×5): qty 1

## 2023-03-11 MED ORDER — SODIUM CHLORIDE 0.9 % IV BOLUS
500.0000 mL | Freq: Once | INTRAVENOUS | Status: AC
  Administered 2023-03-11: 500 mL via INTRAVENOUS

## 2023-03-11 MED ORDER — SODIUM CHLORIDE 0.9% IV SOLUTION: Freq: Once | INTRAVENOUS | Status: DC

## 2023-03-11 MED ORDER — ADULT MULTIVITAMIN W/MINERALS CH
1.0000 | ORAL_TABLET | Freq: Every day | ORAL | Status: DC
  Administered 2023-03-11 – 2023-03-12 (×2): 1
  Filled 2023-03-11 (×2): qty 1

## 2023-03-11 MED ORDER — HYDROCODONE-ACETAMINOPHEN 5-325 MG PO TABS: 1.0000 | ORAL_TABLET | Freq: Four times a day (QID) | ORAL | Status: DC | PRN

## 2023-03-11 MED ORDER — PHENOBARBITAL 60 MG/ML ORAL SUSPENSION: 30.0000 mg | Freq: Three times a day (TID) | ORAL | Status: DC

## 2023-03-11 MED ORDER — PHYTONADIONE 5 MG PO TABS: 10.0000 mg | ORAL_TABLET | Freq: Every day | ORAL | Status: DC

## 2023-03-11 MED ORDER — POTASSIUM CL IN DEXTROSE 5% 20 MEQ/L IV SOLN
20.0000 meq | INTRAVENOUS | Status: DC
  Administered 2023-03-11: 20 meq via INTRAVENOUS
  Filled 2023-03-11 (×2): qty 1000

## 2023-03-11 MED ORDER — SODIUM CHLORIDE 0.45 % IV SOLN
INTRAVENOUS | Status: DC
  Filled 2023-03-11 (×3): qty 1000

## 2023-03-11 MED ORDER — LORAZEPAM 2 MG/ML IJ SOLN
1.0000 mg | INTRAMUSCULAR | Status: DC | PRN
  Administered 2023-03-12 (×3): 1 mg via INTRAVENOUS
  Filled 2023-03-11 (×3): qty 1

## 2023-03-11 MED ORDER — PHENOBARBITAL SODIUM 65 MG/ML IJ SOLN
32.5000 mg | Freq: Three times a day (TID) | INTRAMUSCULAR | Status: DC
  Administered 2023-03-15: 32.5 mg via INTRAVENOUS
  Filled 2023-03-11: qty 1

## 2023-03-11 MED ORDER — PHENOBARBITAL SODIUM 65 MG/ML IJ SOLN: 65.0000 mg | Freq: Three times a day (TID) | INTRAMUSCULAR | Status: DC

## 2023-03-11 MED ORDER — SODIUM CHLORIDE 0.9 % IV SOLN
260.0000 mg | Freq: Once | INTRAVENOUS | Status: AC
  Administered 2023-03-11: 260 mg via INTRAVENOUS
  Filled 2023-03-11: qty 2

## 2023-03-11 MED ORDER — PHENOBARBITAL SODIUM 130 MG/ML IJ SOLN
97.5000 mg | Freq: Three times a day (TID) | INTRAMUSCULAR | Status: AC
  Administered 2023-03-11 – 2023-03-13 (×6): 97.5 mg via INTRAVENOUS
  Filled 2023-03-11 (×6): qty 1

## 2023-03-11 MED ORDER — LACTULOSE 10 GM/15ML PO SOLN
20.0000 g | Freq: Three times a day (TID) | ORAL | Status: DC
  Administered 2023-03-11 (×2): 20 g
  Filled 2023-03-11 (×3): qty 30

## 2023-03-11 MED ORDER — THIAMINE MONONITRATE 100 MG PO TABS
100.0000 mg | ORAL_TABLET | Freq: Every day | ORAL | Status: DC
  Administered 2023-03-11 – 2023-03-12 (×2): 100 mg
  Filled 2023-03-11 (×2): qty 1

## 2023-03-11 MED ORDER — THIAMINE HCL 100 MG/ML IJ SOLN: 100.0000 mg | Freq: Every day | INTRAMUSCULAR | Status: DC

## 2023-03-11 MED ORDER — PHENOBARBITAL SODIUM 65 MG/ML IJ SOLN: 32.5000 mg | Freq: Three times a day (TID) | INTRAMUSCULAR | Status: DC

## 2023-03-11 MED ORDER — LORAZEPAM 2 MG/ML IJ SOLN
1.0000 mg | INTRAMUSCULAR | Status: DC | PRN
  Administered 2023-03-11: 1 mg via INTRAVENOUS
  Filled 2023-03-11: qty 1

## 2023-03-11 MED ORDER — SODIUM CHLORIDE 0.9 % IV SOLN
50.0000 ug/h | INTRAVENOUS | Status: DC
  Administered 2023-03-11 – 2023-03-12 (×3): 50 ug/h via INTRAVENOUS
  Filled 2023-03-11 (×5): qty 1

## 2023-03-11 MED ORDER — FOLIC ACID 1 MG PO TABS
1.0000 mg | ORAL_TABLET | Freq: Every day | ORAL | Status: DC
  Administered 2023-03-11 – 2023-03-12 (×2): 1 mg
  Filled 2023-03-11 (×2): qty 1

## 2023-03-11 MED ORDER — HALOPERIDOL LACTATE 5 MG/ML IJ SOLN: 5.0000 mg | Freq: Once | INTRAMUSCULAR | Status: AC

## 2023-03-11 MED ORDER — PANTOPRAZOLE SODIUM 40 MG IV SOLR
40.0000 mg | Freq: Two times a day (BID) | INTRAVENOUS | Status: DC
  Administered 2023-03-11 – 2023-03-12 (×2): 40 mg via INTRAVENOUS
  Filled 2023-03-11 (×2): qty 10

## 2023-03-11 MED ORDER — HALOPERIDOL LACTATE 5 MG/ML IJ SOLN
INTRAMUSCULAR | Status: AC
  Administered 2023-03-11: 5 mg via INTRAVENOUS
  Filled 2023-03-11: qty 1

## 2023-03-11 MED ORDER — PHYTONADIONE 5 MG PO TABS
10.0000 mg | ORAL_TABLET | Freq: Every day | ORAL | Status: DC
  Administered 2023-03-11 – 2023-03-12 (×2): 10 mg
  Filled 2023-03-11 (×2): qty 2

## 2023-03-11 MED ORDER — PHENOBARBITAL SODIUM 130 MG/ML IJ SOLN
97.5000 mg | Freq: Three times a day (TID) | INTRAMUSCULAR | Status: DC
  Administered 2023-03-11: 97.5 mg via INTRAVENOUS
  Filled 2023-03-11: qty 1

## 2023-03-11 MED ORDER — SODIUM CHLORIDE 0.9% IV SOLUTION: Freq: Once | INTRAVENOUS | Status: AC

## 2023-03-11 MED ORDER — METOPROLOL TARTRATE 5 MG/5ML IV SOLN
5.0000 mg | Freq: Once | INTRAVENOUS | Status: AC
  Administered 2023-03-11: 5 mg via INTRAVENOUS
  Filled 2023-03-11: qty 5

## 2023-03-11 NOTE — Progress Notes (Signed)
Patient pulled IV in left forearm out. Haldol given per emar. Platelets started and maintenance IV fluid on hold at this time.

## 2023-03-11 NOTE — Consult Note (Addendum)
Consultation  Referring Provider: TRH/ [facility]GNF Primary Care Physician:  Zigmund Daniel., MD Primary Gastroenterologist:  Dr.Mansouraty/[facility]/Dawn Drazek NP  Reason for Consultation: GI bleeding, dark red blood via NG tube  HPI: Joann [name] is a 42 y.o. 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  She is status post TIPS. Patient was admitted late on 03/10/2023 after she presented with altered mental status secondary to hepatic encephalopathy.  She was also noted to have a severe lactic acidosis and electrolyte derangement. Decision was made to place an NG tube for administration of medications and lactulose. Last night she put out 200 cc of dark red blood from the NG tube, and has had another 200 to 250 cc today. She has a Flexi-Seal in place with very dark stool but not grossly bloody. She remains quite encephalopathic, unable to follow commands but moving in the bed. Has been agitated and phenobarb is being started. She is on CIWA protocol  Labs on arrival 03/09/2023 hemoglobin 10.4/platelets 36/ammonia 165 Drug screen positive for benzos Lactate greater than 9. Blood cultures positive for MRSE 1 out of 4 suspected contaminant continuing broad-spectrum antibiotic coverage.  CT chest abdomen and pelvis yesterday left lower lobe infrahilar airspace disease consistent with pneumonia or aspiration, small hiatal hernia, mild wall thickening of the distal esophagus, cirrhotic liver with mild hepatic steatosis several hypodensities too small to characterize, patent TIPS, chronic calcific pancreatitis, patchy hypoenhancement lower pole both kidneys likely due to pyelonephritis  Today WBC 3.4/hemoglobin 7.9/hematocrit 27.1/MCV 105 Platelets down to 14,000 Sodium 151/potassium 2.9 BUN  6/creatinine 0.76 T. bili 7.1/AST 135/ALT15 INR 2.5  Patient is unable to offer any history Palliative care consulted today, spoke with her husband who wants her to be a full code.  Last EGD here was done in December 2023 for repeat banding-noted to have grade 2 and grade 3 varices in the distal esophagus 3 bands placed 2 cm hiatal hernia and moderate portal gastropathy. Patient then had an admission in [facility] in January 2024 with recurrent bleeding, she underwent a TIPS procedure there and had EGD on 10/12/2022 showing small bleeding varix and bleeding esophageal ulceration.   Past Medical History:  Diagnosis Date   [redacted] weeks gestation of pregnancy 03/22/2020   Alcohol abuse    Alcohol withdrawal delirium (HCC) 02/15/2022   Alcohol-induced acute pancreatitis    Alcoholic ketoacidosis 08/28/2018   Anxiety    DTs (delirium tremens) (HCC)    Elevated LFTs 10/24/2018   Encephalopathy acute    Hepatic encephalopathy (HCC) 12/24/2019   Hypertension    Hypokalemia 04/02/2019   Hypotension 02/24/2022   Liver disease     Past Surgical History:  Procedure Laterality Date   ECTOPIC PREGNANCY SURGERY     ESOPHAGEAL BANDING  07/25/2022   Procedure: ESOPHAGEAL BANDING;  Surgeon: Lemar Lofty., MD;  Location: [facility];  Service: Gastroenterology;;   ESOPHAGEAL BANDING  08/30/2022   Procedure: ESOPHAGEAL BANDING;  Surgeon: Lemar Lofty., MD;  Location: [facility];  Service: Gastroenterology;;   ESOPHAGOGASTRODUODENOSCOPY (EGD) WITH PROPOFOL N/A 12/27/2019   Procedure: ESOPHAGOGASTRODUODENOSCOPY (EGD) WITH PROPOFOL;  Surgeon: Jeani Hawking, MD;  Location: [facility];  Service: Endoscopy;  Laterality: N/A;   ESOPHAGOGASTRODUODENOSCOPY (EGD) WITH PROPOFOL N/A 06/26/2022   Procedure: ESOPHAGOGASTRODUODENOSCOPY (EGD) WITH PROPOFOL;  Surgeon: Shellia Cleverly, DO;  Location: [facility];  Service:  Gastroenterology;  Laterality: N/A;   ESOPHAGOGASTRODUODENOSCOPY (EGD)  WITH PROPOFOL N/A 07/25/2022   Procedure: ESOPHAGOGASTRODUODENOSCOPY (EGD) WITH PROPOFOL;  Surgeon: Meridee Score Netty Starring., MD;  Location: [facility];  Service: Gastroenterology;  Laterality: N/A;   ESOPHAGOGASTRODUODENOSCOPY (EGD) WITH PROPOFOL N/A 08/30/2022   Procedure: ESOPHAGOGASTRODUODENOSCOPY (EGD) WITH PROPOFOL;  Surgeon: Meridee Score Netty Starring., MD;  Location: [facility];  Service: Gastroenterology;  Laterality: N/A;   HEMOSTASIS CLIP PLACEMENT  07/25/2022   Procedure: HEMOSTASIS CLIP PLACEMENT;  Surgeon: Lemar Lofty., MD;  Location: [facility];  Service: Gastroenterology;;   HOT HEMOSTASIS N/A 07/25/2022   Procedure: HOT HEMOSTASIS (ARGON PLASMA COAGULATION/BICAP);  Surgeon: Lemar Lofty., MD;  Location: [facility];  Service: Gastroenterology;  Laterality: N/A;   IR RADIOLOGIST EVAL & MGMT  08/13/2022    Prior to Admission medications   Medication Sig Start Date End Date Taking? Authorizing Provider  disulfiram (ANTABUSE) 250 MG tablet Take 500 mg by mouth daily. 02/28/23 05/29/23 Yes [provider]  folic acid (FOLVITE) 1 MG tablet Take 1 tablet (1 mg total) by mouth daily. 01/25/23  Yes Alwyn Ren, MD  lactulose (CHRONULAC) 10 GM/15ML solution Take 45 mLs (30 g total) by mouth 3 (three) times daily. 01/25/23  Yes Alwyn Ren, MD  Multiple Vitamin (MULTIVITAMIN WITH MINERALS) TABS tablet Take 1 tablet by mouth daily. Patient taking differently: Take 1 tablet by mouth daily with breakfast. 01/15/20  Yes Angiulli, Mcarthur Rossetti, PA-C  ondansetron (ZOFRAN) 4 MG tablet Take 1 tablet (4 mg total) by mouth every 6 (six) hours as needed for nausea. 01/25/23  Yes Alwyn Ren, MD  pantoprazole (PROTONIX) 40 MG tablet Take 1 tablet (40 mg total) by mouth 2 (two) times daily. 01/25/23  Yes Alwyn Ren, MD  potassium chloride SA (KLOR-CON M) 20 MEQ tablet Take 2 tablets (40 mEq total) by mouth daily. 01/25/23  Yes Alwyn Ren,  MD  rifaximin (XIFAXAN) 550 MG TABS tablet Take 1 tablet (550 mg total) by mouth 2 (two) times daily. 01/25/23  Yes Alwyn Ren, MD  thiamine (VITAMIN B-1) 100 MG tablet Take 1 tablet (100 mg total) by mouth daily. 01/25/23  Yes Alwyn Ren, MD    Current Facility-Administered Medications  Medication Dose Route Frequency Provider Last Rate Last Admin   0.9 %  sodium chloride infusion (Manually program via Guardrails IV Fluids)   Intravenous Once Tyrone Nine, MD       ceFEPIme (MAXIPIME) 2 g in sodium chloride 0.9 % 100 mL IVPB  2 g Intravenous Q8H Bobette Mo, MD   Stopped at 03/11/23 [phone]   Chlorhexidine Gluconate Cloth 2 % PADS 6 each  6 each Topical Daily Howerter, Justin B, DO   6 each at 03/11/23 0915   folic acid (FOLVITE) tablet 1 mg  1 mg Per Tube Daily Tyrone Nine, MD   1 mg at 03/11/23 0915   lactulose (CHRONULAC) 10 GM/15ML solution 20 g  20 g Per Tube TID Tyrone Nine, MD       LORazepam (ATIVAN) injection 1 mg  1 mg Intravenous Q4H PRN Bowser, Kaylyn Layer, NP       melatonin tablet 3 mg  3 mg Oral QHS PRN Howerter, Justin B, DO       metroNIDAZOLE (FLAGYL) IVPB 500 mg  500 mg Intravenous Q12H Howerter, Justin B, DO   Stopped at 03/11/23 0341   multivitamin with minerals tablet 1 tablet  1 tablet Per Tube Daily Grunz,  Fulton Reek, MD   1 tablet at 03/11/23 0915   ondansetron (ZOFRAN) injection 4 mg  4 mg Intravenous Q6H PRN Howerter, Justin B, DO       Oral care mouth rinse  15 mL Mouth Rinse PRN Howerter, Justin B, DO       pantoprazole (PROTONIX) injection 40 mg  40 mg Intravenous Q12H Bowser, Kaylyn Layer, NP       PHENObarbital (LUMINAL) injection 97.5 mg  97.5 mg Intravenous Q8H Bowser, Grace E, NP   97.5 mg at 03/11/23 1228   Followed by   Melene Muller ON 03/13/2023] PHENObarbital (LUMINAL) injection 65 mg  65 mg Intravenous Q8H Bowser, Grace E, NP       Followed by   Melene Muller ON ] PHENObarbital (LUMINAL) injection 32.5 mg  32.5 mg Intravenous Q8H Bowser, Grace  E, NP       phytonadione (VITAMIN K) tablet 10 mg  10 mg Per Tube Daily Danford Bad, RPH   10 mg at 03/11/23 1232   rifaximin (XIFAXAN) tablet 550 mg  550 mg Per Tube BID Lorin Glass, MD   550 mg at 03/11/23 0915   sodium chloride 0.45 % 1,000 mL with potassium chloride 40 mEq infusion   Intravenous Continuous Hazeline Junker B, MD 100 mL/hr at 03/11/23 1200 Infusion Verify at 03/11/23 1200   thiamine (VITAMIN B1) tablet 100 mg  100 mg Per Tube Daily Tyrone Nine, MD   100 mg at 03/11/23 0915   Or   thiamine (VITAMIN B1) injection 100 mg  100 mg Intravenous Daily Tyrone Nine, MD        Allergies as of 03/09/2023 - Review Complete 03/09/2023  Allergen Reaction Noted   Latex Hives and Other (See Comments) 01/08/2020   Tape Other (See Comments) 10/31/2022    Family History  Problem Relation Age of Onset   Healthy Mother    Alcohol abuse Mother        quit in her 6s   Alcohol abuse Father    Diabetes Father    Healthy Brother    Hypertension Maternal Grandmother    Hypertension Maternal Grandfather    Healthy Brother    Healthy Brother     Social History   Socioeconomic History   Marital status: Married    Spouse name: Not on file   Number of children: Not on file   Years of education: Not on file   Highest education level: Not on file  Occupational History   Occupation: nurse  Tobacco Use   Smoking status: Never   Smokeless tobacco: Never  Vaping Use   Vaping Use: Never used  Substance and Sexual Activity   Alcohol use: Yes    Comment: 1 gallon a day   Drug use: Never   Sexual activity: Yes    Birth control/protection: None  Other Topics Concern   Not on file  Social History Narrative   Not on file   Social Determinants of Health   Financial Resource Strain: Not on file  Food Insecurity: No Food Insecurity (01/30/2023)   Hunger Vital Sign    Worried About Running Out of Food in the Last Year: Never true    Ran Out of Food in the Last Year: Never true   Transportation Needs: No Transportation Needs (01/30/2023)   PRAPARE - Administrator, Civil Service (Medical): No    Lack of Transportation (Non-Medical): No  Physical Activity: Not on file  Stress: Not on  file  Social Connections: Not on file  Intimate Partner Violence: Not At Risk (01/30/2023)   Humiliation, Afraid, Rape, and Kick questionnaire    Fear of Current or Ex-Partner: No    Emotionally Abused: No    Physically Abused: No    Sexually Abused: No    Review of Systems: Pertinent positive and negative review of systems were noted in the above HPI section.  All other review of systems was otherwise negative.  Physical Exam: Vital signs in last 24 hours: Temp:  [97.6 F (36.4 C)-98.8 F (37.1 C)] 97.6 F (36.4 C) (07/01 1200) Pulse Rate:  [99-140] 122 (07/01 1200) Resp:  [16-31] 23 (07/01 1200) BP: (89-147)/(42-82) 147/80 (07/01 1200) SpO2:  [94 %-100 %] 100 % (07/01 1200) Weight:  [69.2 kg] 69.2 kg (07/01 0500) Last BM Date : 03/11/23 General: Somewhat agitated, not following commands unable to answer any questions well-developed African-American female Head:  Normocephalic and atraumatic. Eyes:  Sclera icteric  Conjunctiva pink. Ears:  Normal auditory acuity. Nose:  No deformity, discharge,  or lesions. Mouth:  No deformity or lesions.   Neck:  Supple; no masses or thyromegaly. Lungs:  Clear throughout to auscultation.   No wheezes, crackles, or rhonchi. Heart: tachy Regular rate and rhythm; no murmurs, clicks, rubs,  or gallops. Abdomen:  Soft, unable to appreciate any tenderness or obvious ascites BS active,nonpalp mass or hsm.   Rectal: Flexi-Seal in place, dark blackish but not grossly bloody stool Msk:  Symmetrical without gross deformities. . Pulses:  Normal pulses noted. Extremities:  Without clubbing or edema. Neurologic: Encephalopathic, not able to follow commands, mumbling Skin:  Intact without significant lesions or  rashes..   Intake/Output from previous day: 06/30 0701 - 07/01 0700 In: 1908.9 [I.V.:523; IV Piggyback:1386] Out: 2340 [Urine:1225; Stool:1115] Intake/Output this shift: Total I/O In: 462.8 [I.V.:413.5; IV Piggyback:49.4] Out: 200 [Emesis/NG output:200]  Lab Results: Recent Labs    03/09/23 2229 03/10/23 0707 03/11/23 0307  WBC 7.3 5.1 3.4*  HGB 10.4* 8.7* 7.9*  HCT 33.8* 28.6* 27.1*  PLT 36* 22* 14*   BMET Recent Labs    03/10/23 0707 03/11/23 0307 03/11/23 0719  NA 140 151* 150*  K 3.4* 2.9* 2.8*  CL 106 119* 120*  CO2 14* 20* 19*  GLUCOSE 107* 105* 87  BUN 7 6 5*  CREATININE 0.64 0.76 0.71  CALCIUM 7.1* 8.0* 8.0*   LFT Recent Labs    03/11/23 0307  PROT 8.6*  ALBUMIN 3.1*  AST 135*  ALT 15  ALKPHOS 76  BILITOT 7.1*   PT/INR Recent Labs    03/11/23 0719  LABPROT 27.5*  INR 2.5*    IMPRESSION:   #68 42 year old African-American female with severely decompensated alcohol induced cirrhosis with continued alcohol abuse, admitted on 03/09/2023 with altered mental status secondary to encephalopathy Also found to have severe lactic acidosis-concerns for sepsis, pyelonephritis  Current MEL D = 24 MELD-NA= 24  #2 acute upper GI bleeding onset after NG tube placement-patient does have prior history of esophageal varices, status post several EGDs with banding's. She did undergo TIPS procedure in January 2024 at [facility].  Last EGD here in December with grade 2-3 esophageal varices which were banded and then had a repeat banding procedure 10/12/2022 with finding of a small varix and an esophageal ulceration.  Etiology of current bleeding is not clear doubt this is variceal, she is status post TIPS, she may have an esophageal ulceration or abrasion secondary to NG placement with bleeding in  setting of severe thrombocytopenia  #3 anemia secondary to above #4.  Severe thrombocytopenia with platelet count of 14,000 currently #5 severe coagulopathy INR  2.5 #6 electrolyte derangement hypokalemia hypophosphatemia #7 EtOH withdrawal, requiring phenobarb  PLAN: Keep n.p.o. IV PPI twice daily Will start octreotide empirically Continue broad-spectrum antibiotics Patient needs platelet transfusions to keep platelets above 35,000, FFP for correction of coagulopathy Will need to be intubated if EGD to be done.  Currently we will plan for EGD tomorrow after further resuscitation with blood products etc.  If she has active acute bleed in the interim this can be done sooner at bedside after intubation.  GI will follow with you   Amy Esterwood P_C 03/11/2023, 1:15 PM   I have taken an interval history, thoroughly reviewed the chart and examined the patient. I agree with the Advanced Practitioner's note, impression and recommendations, and have recorded additional findings, impressions and recommendations below. I performed a substantive portion of this encounter (>50% time spent), including a complete performance of the medical decision making.  My additional thoughts are as follows:  42 year old woman with alcohol-related cirrhosis complicated by recurrent GI bleeding, well-known to our service. Here with altered mental status, multiple severe electrolyte disturbances, concern for pyelonephritis and sepsis, pancytopenia, now with bright red blood from NG tube aspirate after tube was placed neuro to administer meds and lactulose for encephalopathy.  Patient is also likely in alcohol withdrawal since there was alcohol consumption up to the time of admission. Bleeding complicated by moderate coagulopathy and severe thrombocytopenia.  Since she has had a TIPS placed at [facility] earlier this year, it is unlikely this patient has variceal bleeding.  She seems most likely to have had esophageal or gastric mucosal trauma from NG tube placement and suction complicated by deficiencies and hemostatic function as noted above.  Flexi-Seal currently has dark green  liquid output but it is not melena.  There is about 200 cc of bright red blood in the NG tube collection canister, that bleeding does not appear to be ongoing.  Hemoglobin was 10.42 days ago, 8.7 yesterday morning, 7.9 this morning and 8.5 on a mid afternoon recheck today.  Platelets have dropped from 36 K on admission to 14 today, likely worsened by consumption from bleeding and possibly from sepsis.  Very difficult situation, patient's overall condition quite poor and prognosis also remains poor with ongoing alcohol use and the severity of current issues. Our advice so far, conveyed to primary medical attending and critical care team and support staff, are as noted above with supportive care including platelets, FFP, octreotide (for now), continued use of the NG tube for lactulose and medicines, other supportive care regarding her alcohol withdrawal, and close observation. We have tentatively schedule an upper endoscopy at the bedside tomorrow, but if her bleeding appears to have stabilized with the above measures, that may not be necessary.  It is my hope that the EGD may not be needed since the placement and withdrawal of the scope typically dislodges the NG tube as well, which would then require repeat placement and potentially further mucosal injury and bleeding.  Dr. Yancey Flemings will be on-call overnight if this patient has brisk GI bleeding with hemodynamic compromise that may require emergent endoscopy.  Please call him if the need arises. Charlie Pitter III Office:[phone]

## 2023-03-11 NOTE — Progress Notes (Signed)
CCM was engaged by nursing re agitation again this afternoon. We ordered Haldol which generally does not cause significant resp depression.   Since, CCM was called urgently re hypoxia req NRB.  On arrival, SpO2 100% and she is responsive, protecting her airway. Might have been a pleth issue not a true hypoxemic event.   P Wean O2 for SpO2 goal >92  CXR is ordered  Current tx with phenobarb is appropriate for Dts but share suspicion that there is superimposed behavioral factors as well. If sig agitation recurs despite DT tx could also consider benadryl.   We will continue to follow in consultation    Tessie Fass MSN, AGACNP-BC [facility] Pulmonary/Critical Care Medicine 03/11/2023, 6:47 PM

## 2023-03-11 NOTE — Progress Notes (Signed)
PHARMACY - PHYSICIAN COMMUNICATION CRITICAL VALUE ALERT - BLOOD CULTURE IDENTIFICATION (BCID)  Joann [name] is an 42 y.o. female who presented to [facility] on 03/09/2023 with a chief complaint of altered mental status and history of end-stage alcoholic cirrhosis.    Assessment: 1/4 blood culture bottles: + Methicillin resistant Staphylococcus epidermidis.  Possible blood culture contaminant.    Name of physician (or Provider) Contacted: Dr. Jarvis Newcomer  Current antibiotics: Cefepime, metronidazole  Changes to prescribed antibiotics recommended:  Patient is on recommended antibiotics - No changes needed  Results for orders placed or performed during the hospital encounter of 03/09/23  Blood Culture ID Panel (Reflexed) (Collected: 03/10/2023  1:30 AM)  Result Value Ref Range   Enterococcus faecalis NOT DETECTED NOT DETECTED   Enterococcus Faecium NOT DETECTED NOT DETECTED   Listeria monocytogenes NOT DETECTED NOT DETECTED   Staphylococcus species DETECTED (A) NOT DETECTED   Staphylococcus aureus (BCID) NOT DETECTED NOT DETECTED   Staphylococcus epidermidis DETECTED (A) NOT DETECTED   Staphylococcus lugdunensis NOT DETECTED NOT DETECTED   Streptococcus species NOT DETECTED NOT DETECTED   Streptococcus agalactiae NOT DETECTED NOT DETECTED   Streptococcus pneumoniae NOT DETECTED NOT DETECTED   Streptococcus pyogenes NOT DETECTED NOT DETECTED   A.calcoaceticus-baumannii NOT DETECTED NOT DETECTED   Bacteroides fragilis NOT DETECTED NOT DETECTED   Enterobacterales NOT DETECTED NOT DETECTED   Enterobacter cloacae complex NOT DETECTED NOT DETECTED   Escherichia coli NOT DETECTED NOT DETECTED   Klebsiella aerogenes NOT DETECTED NOT DETECTED   Klebsiella oxytoca NOT DETECTED NOT DETECTED   Klebsiella pneumoniae NOT DETECTED NOT DETECTED   Proteus species NOT DETECTED NOT DETECTED   Salmonella species NOT DETECTED NOT DETECTED   Serratia marcescens NOT DETECTED NOT DETECTED   Haemophilus  influenzae NOT DETECTED NOT DETECTED   Neisseria meningitidis NOT DETECTED NOT DETECTED   Pseudomonas aeruginosa NOT DETECTED NOT DETECTED   Stenotrophomonas maltophilia NOT DETECTED NOT DETECTED   Candida albicans NOT DETECTED NOT DETECTED   Candida auris NOT DETECTED NOT DETECTED   Candida glabrata NOT DETECTED NOT DETECTED   Candida krusei NOT DETECTED NOT DETECTED   Candida parapsilosis NOT DETECTED NOT DETECTED   Candida tropicalis NOT DETECTED NOT DETECTED   Cryptococcus neoformans/gattii NOT DETECTED NOT DETECTED   Methicillin resistance mecA/C DETECTED (A) NOT DETECTED    Lynann Beaver PharmD, BCPS [facility] main pharmacy [phone] 03/11/2023 7:24 AM

## 2023-03-11 NOTE — Progress Notes (Signed)
Patient has had increasing agitation since start of my shift at 1300. She currently has only one IV access site. Providers called to bedside to assess.

## 2023-03-11 NOTE — Discharge Instructions (Signed)
Substance Abuse and Counseling resources:   [facility]- intense outpatient treatment- call [phone]  [facility]- call [phone]

## 2023-03-11 NOTE — Progress Notes (Signed)
Patient had episode of apnea, and oxygen saturation dropped in the low 80's, oxygen up to 6L and NRB placed. Responsive to sternal rub, oxygen saturation back to 100% and patient becoming more alert. Remained at bedside and patient is now back to 4L [place] and remains 100%.

## 2023-03-11 NOTE — Progress Notes (Signed)
Per chart review patient recent hospital admission 01/30/23. Currently in [facility] SDU for acute hepatic encephalopathy, AMS, Etoh detox, with plan for EGD on tomorrow. Received TOC consult for substance abuse resources, will attach SA resources to AVS.  TOC will continue to follow for discharge needs.  03/11/23 1506  TOC Brief Assessment  Insurance and Status Reviewed  Patient has primary care physician Yes  Home environment has been reviewed from home with husband  Prior level of function: independent  Prior/Current Home Services No current home services  Social Determinants of Health Reivew SDOH reviewed no interventions necessary  Readmission risk has been reviewed Yes  Transition of care needs transition of care needs identified, TOC will continue to follow

## 2023-03-11 NOTE — Progress Notes (Signed)
[facility] PROGRESS NOTE  Joann [name] (DOB: 1980/09/20) ZOX:[id number]  Brief Narrative: Joann [name] is a 42 y.o. female with a history of end-stage alcoholic cirrhosis s/p TIPS, ongoing alcohol abuse, bleeding esophageal varices, hepatic encephalopathy, pancytopenia who presented to the ED on 03/09/2023 with encephalopathy, agitation. She continued to be agitated requiring IV benzodiazepines, imaging suggestive of LLL infrahilar airspace opacity concerning for aspiration pneumonia, patent TIPS stent, chronic calcific pancreatitis, patchy bilateral renal hypoenhancement suspicious for pyelonephritis. Findings also equivocal for cystitis, descending/sigmoid colitis. Lactic acidosis noted. Broad spectrum antibiotics were administered, CIWA started, electrolytes supplemented, and she was admitted to ICU/SDU with PCCM consultation. NG tube placed, lactulose and rifaximin given. She's had ~200cc bloody output. Giving platelet transfusion, vitamin K for coagulopathy, PPI BID, and GI consulted.   Subjective: Pt alert, screaming she wants something to drink, has been pulling at NGT so mittens placed. She does not track me or verbally respond to me.  Objective: BP (!) 89/46   Pulse (!) 106   Temp 98.6 F (37 C) (Oral)   Resp 17   Ht 5\' 1"  (1.549 m)   Wt 69.2 kg   SpO2 100%   BMI 28.83 kg/m   Gen: Chronically ill-appearing female in mild distress/agitation Pulm: Clear, nonlabored  CV: Regular tachycardia without MRG. Trace edema GI: Soft, NT, ND, +BS. No fluid wave, though pt sitting up throughout exam, not cooperative. Neuro: Alert, sitting up in bed, not cooperative with exam but moved all extremities. Ext: Warm, dry Skin: Large ecchymosis on right lateral chest wall similar to picture from H&P. No acute appearing ulcers on visualized skin   Assessment & Plan: Acute metabolic encephalopathy, alcohol withdrawal, hepatic encephalopathy:  - Continue benzodiazepines per CIWA, may need to add  phenobarbital or precedex (has required this in previous admissions).  - Agitation > lethargy argues for withdrawal as etiology more than hepatic encephalopathy. Continue lactulose, rifaximin. Given severe electrolyte derangements, decrease lactulose dose. Continue MVM/thiamine. - Ceribell EEG negative.  Upper GI bleeding: Evidence of esophagitis on imaging, history of bleeding esophageal varices s/p banding, ongoing alcohol use.  - Bleeding has slowed/subsided, more consistent with NGT abrasion in setting of coagulopathy, thrombocytopenia.  - CLD > NPO - PPI IV BID - GI consulted  Acute blood loss anemia, coagulopathy, pancytopenia/thrombocytopenia: GI source known. ?if ecchymosis/hematoma on right chest wall contributing. - Recheck CBC - Transfuse platelets for level < 20k and active bleeding.  - Vitamin K  - Keep T&S UTD and monitor H/H for the need to give transfusion. Conservative threshold strategy for now pending GI input.  - Monitor ecchymosis clinically.  Hypernatremia:  - Change to hypotonic saline and monitor in PM, modify as needed. In light of GI bleeding, will make NPO, but could give water if cleared by GI.  Hypokalemia: Severe. - Continue aggressive supplementation and monitoring.  Hypomagnesemia:  - Supplement  Hypophosphatemia:  - Supplement  Hypotension: Acute on chronic.  - Continue albumin, IVF - Monitor continuously in ICU/SDU with need for sedating medications.  - Abx continued  Blood culture positive for MRSE 1 of 4: Suspected contaminant.  - Given possible infectious niduses and clinical instability, we will continue broad antimicrobial coverage for now.  - Monitor cultures.    Moderate protein calorie malnutrition:  - Optimize nutrition going forward.  End-stage alcoholic cirrhosis: Underlying primary problem exacerbated by ongoing alcohol abuse. Pt is full code, has been seen by palliative care.  - Appreciate palliative care involvement.  Tyrone Nine,  MD [facility] [email or web address] 03/11/2023, 12:06 PM

## 2023-03-11 NOTE — Consult Note (Signed)
Consultation Note Date: 03/11/2023   Patient Name: Joann [name]  DOB: 12/05/1980  MRN: [id number]  Age / Sex: 42 y.o., female  PCP: Joann [name]., MD Referring Physician: Tyrone Nine, MD  Reason for Consultation:  "terminal liver disease. Septic. Still drinking etoh."  HPI/Patient Profile: 42 y.o. female  with past medical history of alcoholic cirrhosis with history of hepatic encephalopathy and bleeding varices, s/p TIPS, anxiety,  admitted on 03/09/2023 with altered mental status. Workup reveals hyperammonemia, aspiration pneumonia, lactic acidosis, ETOH withdrawal. MELD 3.0 on admission calculated to be 24 indicating 87.7% 90 day survival. EEG negative for seizures. Palliative medicine consulted for GOC.    Primary Decision Maker NEXT OF KIN - spouse- Joann [name]  Discussion: Chart reviewed including labs, progress notes, imaging from this and previous encounters.  Joann [name] is followed by her primary cared MD who is with [facility] in [place]. She was seen by hepatology Joann [name] [facility]- but was dismissed from their practice for no showing to appointments.  Joann [name] was followed by Palliative during her admission 5/12-5/17 for alcohol withdrawal. At that time GOC were full scope and full code. Patient was reluctant to discuss her diagnosis and potential prognosis.  On evaluation patient recently received lorazepam and was sleeping. Per RN report when awake she was extremely agitated, not following commands.  I called her spouse Joann [name]. We discussed her current acute and chronic illnesses. Joann [name] expressed the difficulty that Joann [name]'s alcoholism has brought their family. He has been trying to get her into an inpt treatment center, but none will accept her Medicaid. He is hopeful that she will improve during this hospitalization and come home. He requested resources for counseling and substance  abuse treatment.  GOC are to continue full scope full code. Joann [name] shared that he would not want [place] to "break Joann [name]'s bones". I explained that unfortunately, I cannot guarantee her bones would not break if CPR is performed. He responded with understanding and confirmed full code status.    SUMMARY OF RECOMMENDATIONS -Alcoholic liver cirrhosis, ETOH withdrawal, sepsis- full scope, full code -Recommend GI consult- she needs to establish with GI for follow of her alcoholic hepatic cirrhosis- she was dismissed from [facility] clinic   -Provided information for [facility] [place] to the spouse and put information on d/c instructions -PMT will continue to follow  Code Status/Advance Care Planning: Full code   Prognosis:   Unable to determine  Discharge Planning: To Be Determined  Primary Diagnoses: Present on Admission:  Hypomagnesemia  Essential hypertension  Hypokalemia  Lactic acidosis  Thrombocytopenia (HCC)  Aspiration into airway  Acute hepatic encephalopathy (HCC)  Hypoalbuminemia  Abnormal LFTs  Alcoholic cirrhosis (HCC)  ETOH abuse  Ecchymosis  Hypophosphatemia   Review of Systems  Physical Exam  Vital Signs: BP (!) 89/46   Pulse (!) 106   Temp 98.6 F (37 C) (Oral)   Resp 17   Ht 5\' 1"  (1.549 m)   Wt  69.2 kg   SpO2 100%   BMI 28.83 kg/m  Pain Scale: PAINAD       SpO2: SpO2: 100 % O2 Device:SpO2: 100 % O2 Flow Rate: .O2 Flow Rate (L/min): 2 L/min  IO: Intake/output summary:  Intake/Output Summary (Last 24 hours) at 03/11/2023 1152 Last data filed at 03/11/2023 0729 Gross per 24 hour  Intake 1270.98 ml  Output 2540 ml  Net -1269.02 ml    LBM: Last BM Date : 03/11/23 Baseline Weight: Weight: 66.7 kg Most recent weight: Weight: 69.2 kg       Thank you for this consult. Palliative medicine will continue to follow and assist as needed.  Time Total: 90 minutes Greater than 50%  of this time was spent  counseling and coordinating care related to the above assessment and plan.  Signed by: Joann [name], AGNP-C Palliative Medicine    Please contact Palliative Medicine Team phone at [phone] for questions and concerns.  For individual provider: See Loretha Stapler

## 2023-03-11 NOTE — Progress Notes (Addendum)
NAME:  Joann [name], MRN:  [id number], DOB:  1981-03-05, LOS: 1 ADMISSION DATE:  03/09/2023, CONSULTATION DATE:  03/10/23 REFERRING MD:  Robb Matar, CHIEF COMPLAINT:  AMS   History of Present Illness:  42 year old woman with a history of severe alcoholism and alcoholic cirrhosis who is presenting with altered mental status.  She has a history of recurrent admissions for combination of withdrawal problems and/or hepatic encephalopathy.  Pulmonary critical care was consulted for persistent lactic acidemia and questionable ability to protect airway.  Pertinent  Medical History  End-stage alcoholic cirrhosis DTs  Significant Hospital Events: Including procedures, antibiotic start and stop dates in addition to other pertinent events   6/29 admit 6/30 ccm consult 7/1 protecting airway, ccm s/o   Interim History / Subjective:   Low K this morning   Objective   Blood pressure 118/68, pulse (!) 104, temperature 98.6 F (37 C), temperature source Oral, resp. rate 18, height 5\' 1"  (1.549 m), weight 69.2 kg, SpO2 100 %.        Intake/Output Summary (Last 24 hours) at 03/11/2023 1034 Last data filed at 03/11/2023 4098 Gross per 24 hour  Intake 1270.98 ml  Output 2540 ml  Net -1269.02 ml   Filed Weights   03/09/23 2221 03/10/23 0800 03/11/23 0500  Weight: 66.7 kg 69.7 kg 69.2 kg    Examination: General: chronically and acutely ill middle aged F NAD  Neuro: awake moving spontaneously not following commands  HEENT: NGT in place pink mm  CV: tachycardic  Pulm: Symmetrical chest expansion, even unlabored  GI: bloody NGT output, FMS   GU: purewick  MSK: no acute joint deformity  Skin: bruise near right Axilla and on RUE    Resolved Hospital Problem list   N/A  Assessment & Plan:    Etoh abuse/disorder Recurrent HE  Decompensated alcoholic cirrhosis Coagulopathy Thrombocytopenia  Anemia -AoC Elevated LFTs  ?GIB vs suction trauma  Hx esophageal varices s/p banding  (08/2022) P -rifaxan -lactulose  -CIWA, micronutrient support. If worsens could consider phenobarb vs precedex but BZD seems ok at present  -BID PPI, follow Ngt output if incr would add octreotide and GI consult, consider plt     LLL aspiration PNA + MRSE bcx likely contaminant  -is on cefepime, flagyl  (+ rifaxan)  NAGMA Lactic acidosis -supportive care  Hypernatremia Hypokalemia Hypo/hypermagnesemia  -follow lytes replace as needed   Protein calorie malnutrition -in setting of etoh abuse     Best Practice (right click and "Reselect all SmartList Selections" daily)  Per primary  Labs   CBC: Recent Labs  Lab 03/09/23 2229 03/10/23 0707 03/11/23 0307  WBC 7.3 5.1 3.4*  NEUTROABS  --  4.1 2.6  HGB 10.4* 8.7* 7.9*  HCT 33.8* 28.6* 27.1*  MCV 99.7 99.3 105.0*  PLT 36* 22* 14*    Basic Metabolic Panel: Recent Labs  Lab 03/09/23 2229 03/10/23 0058 03/10/23 0707 03/10/23 1145 03/11/23 0307 03/11/23 0719  NA 140  --  140  --  151* 150*  K 2.5*  --  3.4*  --  2.9* 2.8*  CL 103  --  106  --  119* 120*  CO2 14*  --  14*  --  20* 19*  GLUCOSE 138*  --  107*  --  105* 87  BUN 9  --  7  --  6 5*  CREATININE 1.15*  --  0.64  --  0.76 0.71  CALCIUM 7.4*  --  7.1*  --  8.0* 8.0*  MG  --  1.2* 1.1*  --  2.5*  --   PHOS  --   --   --  1.0*  --   --    GFR: Estimated Creatinine Clearance: 80.7 mL/min (by C-G formula based on SCr of 0.71 mg/dL). Recent Labs  Lab 03/09/23 2223 03/09/23 2229 03/10/23 0030 03/10/23 0707 03/10/23 1145 03/11/23 0307  PROCALCITON  --   --   --  0.38  --   --   WBC  --  7.3  --  5.1  --  3.4*  LATICACIDVEN >9.0*  --  >9.0* 8.7* 5.6*  --     Liver Function Tests: Recent Labs  Lab 03/09/23 2229 03/10/23 0707 03/11/23 0307  AST 153* 162* 135*  ALT 17 16 15   ALKPHOS 115 91 76  BILITOT 6.4* 5.9* 7.1*  PROT 10.4* 8.3* 8.6*  ALBUMIN 2.6* 2.2* 3.1*   No results for input(s): "LIPASE", "AMYLASE" in the last 168 hours. Recent  Labs  Lab 03/09/23 2316 03/11/23 0307  AMMONIA 165* 86*    ABG    Component Value Date/Time   PHART 7.47 (H) 03/10/2023 0835   PCO2ART 21 (L) 03/10/2023 0835   PO2ART 103 03/10/2023 0835   HCO3 14.9 (L) 03/10/2023 0835   TCO2 21 (L) 07/25/2022 0613   ACIDBASEDEF 7.4 (H) 03/10/2023 0835   O2SAT 99.9 03/10/2023 0835     Coagulation Profile: Recent Labs  Lab 03/11/23 0719  INR 2.5*    Cardiac Enzymes: Recent Labs  Lab 03/09/23 2310 03/10/23 0707  CKTOTAL 438* 561*    HbA1C: Hgb A1c MFr Bld  Date/Time Value Ref Range Status  09/29/2018 03:23 PM 4.8 4.8 - 5.6 % Final    Comment:             Prediabetes: 5.7 - 6.4          Diabetes: >6.4          Glycemic control for adults with diabetes: <7.0     CBG: Recent Labs  Lab 03/10/23 0214 03/10/23 0731  GLUCAP 106* 100*    CRITICAL CARE Performed by: Lanier Clam  N/a  Tessie Fass MSN, AGACNP-BC [facility]monary/Critical Care Medicine Amion for pager  03/11/2023, 10:34 AM

## 2023-03-11 NOTE — Progress Notes (Addendum)
eLink Physician-Brief Progress Note Patient Name: Joann [name] DOB: 1981/01/07 MRN: [id number]   Date of Service  03/11/2023  HPI/Events of Note  Notified of HR 150s. HR had been 110s-120s throughout the day, has slowly been trending upwards. No associated hypotension, BP 128/84.  Pt now calm, after receiving meds for withdrawal. Not agitated.  Afebrile.  No evidence of ongoing bleed.    eICU Interventions  Will recheck H/H One time bolus of NS ordered.  Pt still due to receive additional volume in the form of FFP and platelets.  One time dose of lopressor 5mg  ordered.  Will continue to monitor closely.         Malasha Kleppe M DELA CRUZ 03/11/2023, 8:02 PM  11:28 PM BP has been trending down, MAP now 63.  Pt remains tachycardic, with HR 120s.  Will give second bolus of NS.  H/H ordered from earlier still pending as pt still having ongoing tranfusions.    5:44 AM Notified of K 2.7, Creatinine uptrended, now 1.06 Placed order for 40 meq Kcl IV and 40 meq Kcl PO.  Will continue to monitor serial K.   6:10 AM Post transfusion labs significant for Hgb 6.8, plts 12,000 Placed order to transfuse 1 unit PRBC, 2 more units of platelets.

## 2023-03-11 DEATH — deceased

## 2023-03-12 ENCOUNTER — Encounter (HOSPITAL_COMMUNITY): Admission: EM | Disposition: E | Payer: Self-pay | Source: Home / Self Care | Attending: Internal Medicine

## 2023-03-12 DIAGNOSIS — K7030 Alcoholic cirrhosis of liver without ascites: Secondary | ICD-10-CM | POA: Diagnosis not present

## 2023-03-12 DIAGNOSIS — Z7189 Other specified counseling: Secondary | ICD-10-CM

## 2023-03-12 DIAGNOSIS — Z66 Do not resuscitate: Secondary | ICD-10-CM

## 2023-03-12 DIAGNOSIS — D696 Thrombocytopenia, unspecified: Secondary | ICD-10-CM | POA: Diagnosis not present

## 2023-03-12 DIAGNOSIS — K921 Melena: Secondary | ICD-10-CM

## 2023-03-12 DIAGNOSIS — Z515 Encounter for palliative care: Secondary | ICD-10-CM | POA: Diagnosis not present

## 2023-03-12 DIAGNOSIS — F1010 Alcohol abuse, uncomplicated: Secondary | ICD-10-CM | POA: Diagnosis not present

## 2023-03-12 DIAGNOSIS — D62 Acute posthemorrhagic anemia: Secondary | ICD-10-CM | POA: Diagnosis not present

## 2023-03-12 DIAGNOSIS — K920 Hematemesis: Secondary | ICD-10-CM | POA: Diagnosis not present

## 2023-03-12 DIAGNOSIS — R58 Hemorrhage, not elsewhere classified: Secondary | ICD-10-CM

## 2023-03-12 DIAGNOSIS — K7682 Hepatic encephalopathy: Secondary | ICD-10-CM | POA: Diagnosis not present

## 2023-03-12 LAB — CBC WITH DIFFERENTIAL/PLATELET
Abs Immature Granulocytes: 0.01 10*3/uL (ref 0.00–0.07)
Basophils Absolute: 0 10*3/uL (ref 0.0–0.1)
Basophils Relative: 0 %
Eosinophils Absolute: 0 10*3/uL (ref 0.0–0.5)
Eosinophils Relative: 0 %
HCT: 23.4 % — ABNORMAL LOW (ref 36.0–46.0)
Hemoglobin: 6.8 g/dL — CL (ref 12.0–15.0)
Immature Granulocytes: 0 %
Lymphocytes Relative: 7 %
Lymphs Abs: 0.2 10*3/uL — ABNORMAL LOW (ref 0.7–4.0)
MCH: 30.9 pg (ref 26.0–34.0)
MCHC: 29.1 g/dL — ABNORMAL LOW (ref 30.0–36.0)
MCV: 106.4 fL — ABNORMAL HIGH (ref 80.0–100.0)
Monocytes Absolute: 0.1 10*3/uL (ref 0.1–1.0)
Monocytes Relative: 3 %
Neutro Abs: 2.5 10*3/uL (ref 1.7–7.7)
Neutrophils Relative %: 90 %
Platelets: 12 10*3/uL — CL (ref 150–400)
RBC: 2.2 MIL/uL — ABNORMAL LOW (ref 3.87–5.11)
RDW: 21.3 % — ABNORMAL HIGH (ref 11.5–15.5)
WBC: 2.8 10*3/uL — ABNORMAL LOW (ref 4.0–10.5)
nRBC: 0.7 % — ABNORMAL HIGH (ref 0.0–0.2)

## 2023-03-12 LAB — COMPREHENSIVE METABOLIC PANEL
ALT: 18 U/L (ref 0–44)
AST: 113 U/L — ABNORMAL HIGH (ref 15–41)
Albumin: 3.2 g/dL — ABNORMAL LOW (ref 3.5–5.0)
Alkaline Phosphatase: 67 U/L (ref 38–126)
Anion gap: 9 (ref 5–15)
BUN: 6 mg/dL (ref 6–20)
CO2: 18 mmol/L — ABNORMAL LOW (ref 22–32)
Calcium: 7.7 mg/dL — ABNORMAL LOW (ref 8.9–10.3)
Chloride: 124 mmol/L — ABNORMAL HIGH (ref 98–111)
Creatinine, Ser: 1.06 mg/dL — ABNORMAL HIGH (ref 0.44–1.00)
GFR, Estimated: >60 mL/min (ref >60)
Glucose, Bld: 188 mg/dL — ABNORMAL HIGH (ref 70–99)
Potassium: 2.7 mmol/L — CL (ref 3.5–5.1)
Sodium: 151 mmol/L — ABNORMAL HIGH (ref 135–145)
Total Bilirubin: 6.5 mg/dL — ABNORMAL HIGH (ref 0.3–1.2)
Total Protein: 8 g/dL (ref 6.5–8.1)

## 2023-03-12 LAB — PREPARE PLATELET PHERESIS
Unit division: 0
Unit division: 0
Unit division: 0
Unit division: 0

## 2023-03-12 LAB — PHOSPHORUS: Phosphorus: 1.7 mg/dL — ABNORMAL LOW (ref 2.5–4.6)

## 2023-03-12 LAB — BPAM RBC
Blood Product Expiration Date: 202407252359
ISSUE DATE / TIME: 202407020949

## 2023-03-12 LAB — BPAM PLATELET PHERESIS
Blood Product Expiration Date: 202407042359
Blood Product Expiration Date: 202407022359
Blood Product Expiration Date: 202407042359
ISSUE DATE / TIME: 202407011818
ISSUE DATE / TIME: 202407012045
ISSUE DATE / TIME: 202407021431
Unit Type and Rh: 5100
Unit Type and Rh: 7300

## 2023-03-12 LAB — AMMONIA: Ammonia: 64 umol/L — ABNORMAL HIGH (ref 9–35)

## 2023-03-12 LAB — BPAM FFP: Blood Product Expiration Date: 202407062359

## 2023-03-12 LAB — PREPARE RBC (CROSSMATCH)

## 2023-03-12 LAB — TYPE AND SCREEN: Unit division: 0

## 2023-03-12 LAB — MAGNESIUM: Magnesium: 2 mg/dL (ref 1.7–2.4)

## 2023-03-12 LAB — CULTURE, BLOOD (ROUTINE X 2)

## 2023-03-12 SURGERY — ESOPHAGOGASTRODUODENOSCOPY (EGD) WITH PROPOFOL
Anesthesia: Moderate Sedation

## 2023-03-12 MED ORDER — POTASSIUM PHOSPHATES 15 MMOLE/5ML IV SOLN
30.0000 mmol | Freq: Once | INTRAVENOUS | Status: DC
  Administered 2023-03-12: 30 mmol via INTRAVENOUS
  Filled 2023-03-12: qty 10

## 2023-03-12 MED ORDER — ONDANSETRON HCL 4 MG/2ML IJ SOLN: 4.0000 mg | Freq: Four times a day (QID) | INTRAMUSCULAR | Status: DC | PRN

## 2023-03-12 MED ORDER — GLYCOPYRROLATE 0.2 MG/ML IJ SOLN: 0.2000 mg | INTRAMUSCULAR | Status: DC | PRN

## 2023-03-12 MED ORDER — POTASSIUM CHLORIDE 10 MEQ/100ML IV SOLN
10.0000 meq | INTRAVENOUS | Status: AC
  Administered 2023-03-12 (×4): 10 meq via INTRAVENOUS
  Filled 2023-03-12 (×4): qty 100

## 2023-03-12 MED ORDER — SODIUM CHLORIDE 0.9 % IV SOLN: INTRAVENOUS | Status: DC

## 2023-03-12 MED ORDER — ORAL CARE MOUTH RINSE: 15.0000 mL | OROMUCOSAL | Status: DC | PRN

## 2023-03-12 MED ORDER — ORAL CARE MOUTH RINSE
15.0000 mL | OROMUCOSAL | Status: DC
  Administered 2023-03-12 – 2023-03-14 (×7): 15 mL via OROMUCOSAL

## 2023-03-12 MED ORDER — ACETAMINOPHEN 325 MG PO TABS: 650.0000 mg | ORAL_TABLET | Freq: Four times a day (QID) | ORAL | Status: DC | PRN

## 2023-03-12 MED ORDER — MORPHINE SULFATE (PF) 2 MG/ML IV SOLN
2.0000 mg | INTRAVENOUS | Status: DC | PRN
  Administered 2023-03-13 (×2): 4 mg via INTRAVENOUS
  Administered 2023-03-13: 2 mg via INTRAVENOUS
  Filled 2023-03-12 (×2): qty 2
  Filled 2023-03-12: qty 1

## 2023-03-12 MED ORDER — DIPHENHYDRAMINE HCL 50 MG/ML IJ SOLN: 25.0000 mg | INTRAMUSCULAR | Status: DC | PRN

## 2023-03-12 MED ORDER — SODIUM CHLORIDE 0.9% IV SOLUTION: Freq: Once | INTRAVENOUS | Status: AC

## 2023-03-12 MED ORDER — POLYVINYL ALCOHOL 1.4 % OP SOLN
1.0000 [drp] | Freq: Four times a day (QID) | OPHTHALMIC | Status: DC | PRN
  Filled 2023-03-12: qty 15

## 2023-03-12 MED ORDER — HYDROXYZINE HCL 10 MG/5ML PO SYRP: 25.0000 mg | ORAL_SOLUTION | ORAL | Status: DC | PRN

## 2023-03-12 MED ORDER — LORAZEPAM 2 MG/ML IJ SOLN
1.0000 mg | INTRAMUSCULAR | Status: DC | PRN
  Filled 2023-03-12: qty 2

## 2023-03-12 MED ORDER — FENTANYL CITRATE PF 50 MCG/ML IJ SOSY
25.0000 ug | PREFILLED_SYRINGE | INTRAMUSCULAR | Status: DC | PRN
  Administered 2023-03-12 (×3): 50 ug via INTRAVENOUS
  Filled 2023-03-12 (×3): qty 1

## 2023-03-12 MED ORDER — FENTANYL CITRATE PF 50 MCG/ML IJ SOSY
50.0000 ug | PREFILLED_SYRINGE | Freq: Once | INTRAMUSCULAR | Status: AC
  Administered 2023-03-12: 50 ug via INTRAVENOUS
  Filled 2023-03-12: qty 1

## 2023-03-12 MED ORDER — HALOPERIDOL LACTATE 5 MG/ML IJ SOLN
2.5000 mg | INTRAMUSCULAR | Status: DC | PRN
  Administered 2023-03-12 – 2023-03-13 (×2): 5 mg via INTRAVENOUS
  Filled 2023-03-12 (×2): qty 1

## 2023-03-12 MED ORDER — POTASSIUM CHLORIDE 2 MEQ/ML IV SOLN
INTRAVENOUS | Status: DC
  Filled 2023-03-12: qty 1000

## 2023-03-12 MED ORDER — ONDANSETRON 4 MG PO TBDP: 4.0000 mg | ORAL_TABLET | Freq: Four times a day (QID) | ORAL | Status: DC | PRN

## 2023-03-12 MED ORDER — GLYCOPYRROLATE 0.2 MG/ML IJ SOLN
0.2000 mg | INTRAMUSCULAR | Status: DC | PRN
  Administered 2023-03-15: 0.2 mg via INTRAVENOUS
  Filled 2023-03-12: qty 1

## 2023-03-12 MED ORDER — GLYCOPYRROLATE 1 MG PO TABS: 1.0000 mg | ORAL_TABLET | ORAL | Status: DC | PRN

## 2023-03-12 MED ORDER — FREE WATER
200.0000 mL | Status: DC
  Administered 2023-03-12 (×3): 200 mL

## 2023-03-12 MED ORDER — MIDAZOLAM HCL 2 MG/2ML IJ SOLN
2.0000 mg | INTRAMUSCULAR | Status: DC | PRN
  Administered 2023-03-13: 2 mg via INTRAVENOUS
  Filled 2023-03-12: qty 4

## 2023-03-12 MED ORDER — LORAZEPAM 1 MG PO TABS: 1.0000 mg | ORAL_TABLET | ORAL | Status: DC | PRN

## 2023-03-12 MED ORDER — POTASSIUM CHLORIDE 20 MEQ PO PACK
40.0000 meq | PACK | Freq: Once | ORAL | Status: AC
  Administered 2023-03-12: 40 meq via ORAL
  Filled 2023-03-12: qty 2

## 2023-03-12 MED ORDER — ACETAMINOPHEN 650 MG RE SUPP: 650.0000 mg | Freq: Four times a day (QID) | RECTAL | Status: DC | PRN

## 2023-03-12 NOTE — IPAL (Signed)
  Interdisciplinary Goals of Care Family Meeting   Date carried out: 03/12/2023  Location of the meeting: Phone conference  Member's involved: Nurse Practitioner, Bedside Registered Nurse, and Family Member or next of kin  Durable Power of Attorney or acting medical decision maker: Husband Joann [name]     Discussion: We discussed goals of care for [name] .  Discussed her chronic liver dz, frequent admissions, ongoing etoh use, current acute illness and decline. Relayed my concern for her overall prognosis regarding her liver dz being very poor, and acutely my concerns re multisystem organ dysfunction (HE, AKI, marrow suppression). Cell lines are down despite transfusions. While there are aspects of supportive care that we can provide, I cannot fix this and anticipate that the sequelae of her liver dz will continue to worsen over time.   We talked about code status-- I have recommended DNR and in discussing this he agrees. He shares her journey w alcoholism and liver disease, and does not want her to suffer.   Overall, I think Joann [name] is appropriate for hospice consideration. We talked about hospice being appropriate in situations where we believe pts have a life expectancy of <10mo. However in light of current decline I worry Joann [name]'s time on earth is likely shorter.   Discussed different hospice options which can differ depending on life expectancy (in-hospital death, residential hospice, home hospice).   He is working on changing his work schedule to come to the hospital later today to continue this conversation. Has asked that I update her mother, and specifically requests that he be the one to update their daughter.   Code status:   Code Status: DNR   Disposition: Continue current acute care  Time spent for the meeting:    Tessie Fass MSN, AGACNP-BC [facility] Pulmonary/Critical Care Medicine 03/12/2023, 9:16 AM

## 2023-03-12 NOTE — Progress Notes (Signed)
Chaplain engaged in an initial visit with Joann [name] to honor consult for prayer over her. [name] was resting throughout visit but Chaplain was able to say a prayer over her and also speak with her sitter in the room.   Chaplain Aarsh Fristoe, MDiv  03/12/23 1300  Spiritual Encounters  Type of Visit Initial  Care provided to: Pt not available  Reason for visit Routine spiritual support  Interventions  Spiritual Care Interventions Made Prayer

## 2023-03-12 NOTE — IPAL (Addendum)
  Interdisciplinary Goals of Care Family Meeting   Date carried out: 03/12/2023  Location of the meeting: Bedside  Member's involved: Nurse Practitioner, Bedside Registered Nurse, and Family Member or next of kin  Durable Power of Attorney or acting medical decision maker: Husband Joann [name].     Discussion: We discussed goals of care for Joann [name] .  We discussed Joann [name] ongoing clinical developments. This afternoon she has become hypotensive.   Emotional support provided as we discussed next steps in transitioning to comfort care. The goal is residential hospice-- pending Joann [name]'s stability this may or may not be feasible. We will transition to comfort care now, starting with PRN medications. We discussed that depending on symptom burden she may require continuous medications.  Discussed what to expect in terms of changes with hemodynamic monitoring, medications to be discontinued, no further labs or blood products. We also discussed what likely symptoms Joann [name] may experience with liver dz + renal dysfunction etc.   We will transfer to a palliative room [facility] consult for hospice placement  Comfort care + complete phenobarb while still in hospital    Code status:   Code Status: DNR   Disposition: In-patient comfort care  Time spent for the meeting:     Lanier Clam, NP  03/12/2023, 6:41 PM

## 2023-03-12 NOTE — Progress Notes (Signed)
Daily Progress Note   Patient Name: Joann [name]       Date: 03/12/2023 DOB: October 29, 1980  Age: 42 y.o. MRN#: [id number] Attending Physician: Joann Nine, MD Primary Care Physician: Joann [name]., MD Admit Date: 03/09/2023 Length of Stay: 2 days  Reason for Consultation/Follow-up: Establishing goals of care  Subjective:   CC: Patient resting when seen initially. Following up regarding complex medical decision making and symptom management.   Subjective:  Discussed care with PCCM providers today. Patient's medical status overall worsening in the setting of a terminal prognosis. Joann Stare, NP has already called patient's husband to inform him of decline despite aggressive medical recommendations. Patient is now DNR. Have recommended transition to comfort focused care and consideration of hospice care. Husband coming into town later today.  Presented to bedside to check on patient. Discussed care with bedside RN. Patient lethargic and not able to participate in conversation. No family present at bedside for initial visit. Awaiting husband to present to bedside.   Checked with bedside RN later in afternoon and patient's husband has still not presented to bedside. Noted Pmt would continue to follow along with patient's medical journey.    Objective:   Vital Signs:  BP (!) 85/53   Pulse 91   Temp 97.8 F (36.6 C) (Oral)   Resp 14   Ht 5\' 1"  (1.549 m)   Wt 70.5 kg   SpO2 100%   BMI 29.37 kg/m   Imaging:  I personally reviewed recent imaging.   Assessment & Plan:   Assessment: Patient is a 42 y.o. female  with past medical history of alcoholic cirrhosis with history of hepatic encephalopathy and bleeding varices, s/p TIPS, anxiety,  admitted on 03/09/2023 with altered mental status. Workup reveals hyperammonemia, aspiration pneumonia, lactic acidosis, ETOH withdrawal. MELD 3.0 on admission calculated to be 24 indicating 87.7% 90 day survival. EEG negative for seizures.  Palliative medicine consulted for GOC.    Recommendations/Plan: # Complex medical decision making/goals of care:  - Patient unable to participate in complex medical decision making due to medical status. No family present at bedside when visited today. PCCM provides have already contacted patient's husband today and changed code status to DNR. It has been appropriately recommended to husband that patient be transitioned to  comfort focused care as she continues to deteriorate medically despite aggressive medical interventions and has an underlying medical condition that cannot be fixed.   -  Code Status: DNR  # Symptom management:  -As per primary team   # Psychosocial Support:  -husband, daughter, mother   # Discharge Planning: To Be Determined  Discussed with: bedside RN, PCCM providers   Thank you for allowing the palliative care team to participate in the care Joann [name].  Joann Morin, DO Palliative Care Provider PMT # [phone]  If patient remains symptomatic despite maximum doses, please call PMT at [phone] between 0700 and 1900. Outside of these hours, please call attending, as PMT does not have night coverage.  This provider spent a total of 27 minutes providing patient's care.  Includes review of EMR, discussing care with other staff members involved in patient's medical care, obtaining relevant history and information from patient and/or patient's family, and personal review of imaging and lab work. Greater than 50% of the time was spent counseling and coordinating care related to the above assessment and plan.    *Please note that this is a verbal dictation therefore any spelling or grammatical errors are due  to the [employer]" system interpretation.

## 2023-03-12 NOTE — Progress Notes (Signed)
NAME:  Joann [name], MRN:  [id number], DOB:  [birth year], LOS: 2 ADMISSION DATE:  03/09/2023, CONSULTATION DATE:  03/10/23 REFERRING MD:  Robb Matar, CHIEF COMPLAINT:  AMS   History of Present Illness:  42 year old woman with a history of severe alcoholism and alcoholic cirrhosis who is presenting with altered mental status.  She has a history of recurrent admissions for combination of withdrawal problems and/or hepatic encephalopathy. Ongoing etoh abuse.  Pulmonary critical care was consulted for persistent lactic acidemia and questionable ability to protect airway.  Pertinent  Medical History  End-stage alcoholic cirrhosis DTs  Significant Hospital Events: Including procedures, antibiotic start and stop dates in addition to other pertinent events   6/29 admit 6/30 ccm consult 7/1 phenobarb. GI consult  7/2 DNR, ongoing GOC and plans to discuss hospice   Interim History / Subjective:   NAEO Myriad of abnormal labs today -- cell lines are down  New AKI  K remains low Na high   Objective   Blood pressure 99/65, pulse (!) 106, temperature 99.4 F (37.4 C), temperature source Axillary, resp. rate 16, height 5\' 1"  (1.549 m), weight 70.5 kg, SpO2 100 %.        Intake/Output Summary (Last 24 hours) at 03/12/2023 0936 Last data filed at 03/12/2023 0856 Gross per 24 hour  Intake 5372.1 ml  Output 1000 ml  Net 4372.1 ml   Filed Weights   03/10/23 0800 03/11/23 0500 03/12/23 0500  Weight: 69.7 kg 69.2 kg 70.5 kg    Examination: General: Chronically and critically ill middle aged F  Neuro: Awake agitated oriented to self  HEENT: NCAT pink mm. CV: tachycardic. Cap refill < 3 sec  Pulm: Symmetrical chest expansion, even unlabored  GI: generalized abdominal tenderness. NGT with bloody output  GU: purewick  MSK: no acute joint deformity, no cyanosis  Skin: RUE bruise, hematoma on R chest wall proximal to axilla    Resolved Hospital Problem list   N/A  Assessment & Plan:   GOC  DNR  status P -see 7/2 IPAL  -DNR status -pt has had overall decline x several months-year regarding her chronic dz processes. She is admitted in a decompensated state and is acutely worsening.  -will cont goc discussion w husband 7/2 when he arrives -- my rec is for hospice   Etoh use disorder w Dts  Recurrent HE Decompensated alcoholic cirrhosis Coagulopathy Thrombocytopenia Anemia Hyperbilirubinemia  Hyperammonemia  NGT/suction trauma vs UGIB  Hx esophageal varices s/p banding as well as WUJW(1191 at [facility]). Last EGD w esophageal ulcer.  Cell lines remain down despite product  P -rifaxan -lactulose  -Vit K -phenobarb + micronutrient support  -getting plt prbc ffp -defer EGD for now w pending GOC  -1x fent for abd pain -- pending response can add PRN   Hyperactive delirium vs behavioral disturbance NOS -in addition to Dts  P -consider SCH vs PRN antipsychotic w careful qtc monitoring   LLL aspiration PNA + MRSE bcx likely contaminant  P -cefepime, flagyl  AKI NAGMA Lactic acidosis  -supportive care  Hypernatremia Hypokalemia Hypophosphatemia  -follow lytes, replace PRN -cont FWF  Protein calorie malnutrition -in setting of etoh abuse    Best Practice (right click and "Reselect all SmartList Selections" daily)  Per primary  Labs   CBC: Recent Labs  Lab 03/09/23 2229 03/10/23 0707 03/11/23 0307 03/11/23 1355 03/12/23 0508  WBC 7.3 5.1 3.4*  --  2.8*  NEUTROABS  --  4.1 2.6  --  2.5  HGB  10.4* 8.7* 7.9* 8.5* 6.8*  HCT 33.8* 28.6* 27.1* 28.6* 23.4*  MCV 99.7 99.3 105.0*  --  106.4*  PLT 36* 22* 14*  --  12*    Basic Metabolic Panel: Recent Labs  Lab 03/10/23 0058 03/10/23 0707 03/10/23 1145 03/11/23 0307 03/11/23 0719 03/11/23 1623 03/12/23 0508  NA  --  140  --  151* 150* 151* 151*  K  --  3.4*  --  2.9* 2.8* 3.4* 2.7*  CL  --  106  --  119* 120* 124* 124*  CO2  --  14*  --  20* 19* 18* 18*  GLUCOSE  --  107*  --  105* 87 92 188*  BUN  --   7  --  6 5* 5* 6  CREATININE  --  0.64  --  0.76 0.71 0.66 1.06*  CALCIUM  --  7.1*  --  8.0* 8.0* 8.0* 7.7*  MG 1.2* 1.1*  --  2.5*  --   --  2.0  PHOS  --   --  1.0*  --   --   --  1.7*   GFR: Estimated Creatinine Clearance: 61.5 mL/min (A) (by C-G formula based on SCr of 1.06 mg/dL (H)). Recent Labs  Lab 03/09/23 2223 03/09/23 2229 03/10/23 0030 03/10/23 0707 03/10/23 1145 03/11/23 0307 03/12/23 0508  PROCALCITON  --   --   --  0.38  --   --   --   WBC  --  7.3  --  5.1  --  3.4* 2.8*  LATICACIDVEN >9.0*  --  >9.0* 8.7* 5.6*  --   --     Liver Function Tests: Recent Labs  Lab 03/09/23 2229 03/10/23 0707 03/11/23 0307 03/12/23 0508  AST 153* 162* 135* 113*  ALT 17 16 15 18   ALKPHOS 115 91 76 67  BILITOT 6.4* 5.9* 7.1* 6.5*  PROT 10.4* 8.3* 8.6* 8.0  ALBUMIN 2.6* 2.2* 3.1* 3.2*   No results for input(s): "LIPASE", "AMYLASE" in the last 168 hours. Recent Labs  Lab 03/09/23 2316 03/11/23 0307 03/12/23 0508  AMMONIA 165* 86* 64*    ABG    Component Value Date/Time   PHART 7.47 (H) 03/10/2023 0835   PCO2ART 21 (L) 03/10/2023 0835   PO2ART 103 03/10/2023 0835   HCO3 14.9 (L) 03/10/2023 0835   TCO2 21 (L) 07/25/2022 0613   ACIDBASEDEF 7.4 (H) 03/10/2023 0835   O2SAT 99.9 03/10/2023 0835     Coagulation Profile: Recent Labs  Lab 03/11/23 0719  INR 2.5*    Cardiac Enzymes: Recent Labs  Lab 03/09/23 2310 03/10/23 0707  CKTOTAL 438* 561*    HbA1C: Hgb A1c MFr Bld  Date/Time Value Ref Range Status  09/29/2018 03:23 PM 4.8 4.8 - 5.6 % Final    Comment:             Prediabetes: 5.7 - 6.4          Diabetes: >6.4          Glycemic control for adults with diabetes: <7.0     CBG: Recent Labs  Lab 03/10/23 0214 03/10/23 0731  GLUCAP 106* 100*    CCT: n/a    Tessie Fass MSN, AGACNP-BC [facility] Amion for pager  03/12/2023, 9:36 AM

## 2023-03-12 NOTE — Progress Notes (Addendum)
Patient ID: Joann [name], female   DOB: August 26, 1981, 42 y.o.   MRN: [id number]    Progress Note   Subjective   Day # 3 CC; GI bleeding, dark red blood via NG tube-patient with decompensated EtOH related cirrhosis  IV PPI IV octreotide  Labs this a.m.-WBC 2.8/hemoglobin 6.8/hematocrit 23.4/platelets 12 (status post 2 units of platelets last p.m.)  Sodium 151/potassium 2.7/BUN 6/creatinine 1.06 (up from 0.6) Ammonia 64 Phosphorus 1.7 INR pending  Per nursing no further active bleeding overnight she had about 100 cc of dark red blood overnight and less than 100 cc this morning, stool is nonmelenic appearing.  Some continued issues with agitation, resting comfortably currently, opens eyes but did not respond to name   Objective   Vital signs in last 24 hours: Temp:  [97.5 F (36.4 C)-99.9 F (37.7 C)] 99.4 F (37.4 C) (07/02 0400) Pulse Rate:  [91-152] 123 (07/02 0830) Resp:  [10-28] 20 (07/02 0830) BP: (68-147)/(29-101) 121/91 (07/02 0830) SpO2:  [94 %-100 %] 100 % (07/02 0830) Weight:  [70.5 kg] 70.5 kg (07/02 0500) Last BM Date : 03/12/23 General: African-American female, acute and chronically ill-appearing, opens eyes but not responding verbally to me currently has been intermittently agitated Heart: tachy Regular rate and rhythm; no murmurs Lungs: Respirations even and unlabored, lungs CTA bilaterally Abdomen:  Soft, nontender and nondistended. Normal bowel sounds. Extremities:  Without edema. Neurologic: Sedated, awake, per notes was oriented to self earlier, remittent agitation   Intake/Output from previous day: 07/01 0701 - 07/02 0700 In: 4944.2 [I.V.:2143.6; Blood:1059; NG/GT:100; IV Piggyback:1641.7] Out: 1200 [Urine:500; Emesis/NG output:500; Stool:200] Intake/Output this shift: Total I/O In: 303.7 [I.V.:203.7; IV Piggyback:100] Out: -   Lab Results: Recent Labs    03/10/23 0707 03/11/23 0307 03/11/23 1355 03/12/23 0508  WBC 5.1 3.4*  --  2.8*  HGB 8.7*  7.9* 8.5* 6.8*  HCT 28.6* 27.1* 28.6* 23.4*  PLT 22* 14*  --  12*   BMET Recent Labs    03/11/23 0719 03/11/23 1623 03/12/23 0508  NA 150* 151* 151*  K 2.8* 3.4* 2.7*  CL 120* 124* 124*  CO2 19* 18* 18*  GLUCOSE 87 92 188*  BUN 5* 5* 6  CREATININE 0.71 0.66 1.06*  CALCIUM 8.0* 8.0* 7.7*   LFT Recent Labs    03/12/23 0508  PROT 8.0  ALBUMIN 3.2*  AST 113*  ALT 18  ALKPHOS 67  BILITOT 6.5*   PT/INR Recent Labs    03/11/23 0719  LABPROT 27.5*  INR 2.5*    Studies/Results: DG CHEST PORT 1 VIEW  Result Date: 03/11/2023 CLINICAL DATA:  Hypoxemia EXAM: PORTABLE CHEST 1 VIEW COMPARISON:  CT chest dated 03/10/2023 FINDINGS: Very mild retrocardiac opacity/pneumonia, although better evaluated on CT. No pleural effusion or pneumothorax. The heart is normal in size. Enteric tube courses into the stomach. IMPRESSION: Very mild retrocardiac opacity/pneumonia, although better evaluated on CT. Electronically Signed   By: Charline Bills M.D.   On: 03/11/2023 19:43   DG Abd 1 View  Result Date: 03/10/2023 CLINICAL DATA:  NG tube placement EXAM: ABDOMEN - 1 VIEW COMPARISON:  Abdominal radiograph August 08, 2022 FINDINGS: NG tube side port and distal tip terminates below the diaphragm, within the stomach. Nonobstructive bowel-gas pattern. Clips and surgical clips seen in the right upper quadrant. The visualized lungs are clear. IMPRESSION: Adequate positioning of the NG tube, with the side port and distal tip below the diaphragm. Electronically Signed   By: Cordelia Pen.D.  On: 03/10/2023 14:18   Rapid EEG  Result Date: 03/10/2023 Joann Quest, MD     03/10/2023  1:12 PM Patient Name: Joann [name] MRN: [id number] Epilepsy Attending: Charlsie [name] Referring Physician/Provider: Lorin Glass, MD Date: 03/10/2023 Duration: 55.40 mins Patient history: 42yo F with ams getting rapid EEG to evaluate for seizure. Level of alertness:  lethargic AEDs during EEG study: None  Technical aspects: This EEG was obtained using a 10 lead EEG system positioned circumferentially without any parasagittal coverage (rapid EEG). Computer selected EEG is reviewed as  well as background features and all clinically significant events. Description: EEG showed continuous generalized 3-5Hz  theta-delta slowing. Hyperventilation and photic stimulation were not performed.   ABNORMALITY - Continuous slow, generalized IMPRESSION: This limited ceribell EEG is study is suggestive of moderate to severe diffuse encephalopathy, nonspecific etiology . No seizures or epileptiform discharges were seen throughout the recording. If suspicion for interictal activity remains a concern, a conventional EEG can be considered. Joann [name] was notified. Joann [name]       Assessment / Plan:    #69 42 year old African-American female with severely decompensated alcohol induced cirrhosis with continued EtOH abuse, admitted on 03/09/2023 with altered mental status secondary to encephalopathy, also with severe lactic acidosis, concerns for sepsis possible pyelonephritis.  Patient is status post TIPS procedure done at [facility] January 2024  Increased risk of encephalopathy post TIPS chronically-she had not been taking lactulose and/or Xifaxan appropriately if actively drinking prior to this admission. Continue high-dose lactulose and Xifaxan  M ELD= 24 MELD-Na= 24  #2 acute upper GI bleeding onset after NG tube placement- Does have prior history of esophageal varices and is status post several prior EGDs with banding's, however she is status post TIPS in January 2024 and EGD done at [facility] on 10/12/2022 showed a very small isolated varix and 1 esophageal ulceration  Patient has severe thrombocytopenia currently and suspect recurrent bleeding is secondary to abrasion from NG tube placement in setting of the severe thrombocytopenia  She is not manifesting active GI bleeding overnight has had drifting hemoglobin  #3  severe thrombocytopenia-platelet count 14,000 yesterday, she was given 2 units of platelets and platelets 12,000 today #4 severe coagulopathy with INR of 2.5 yesterday pending today-on vitamin K and did get FFP yesterday, And is for further platelet transfusions and FFP today per CCM  #5 severe electrolyte derangement #6 EtOH withdrawal requiring phenobarbital #7 acute kidney injury-bump in creatinine today  Plan; continue IV PPI On octreotide  Plan was for tentative EGD today however she has not actively hemorrhaging and with platelet count of 12,000 we are likely to cause more trauma with attempt at EGD.  Would continue to manage medically  Discussion with CCM this morning, due to continued decline in overall status, and worsening hepatic failure with severe coagulopathy and severe thrombocytopenia despite aggressive support, and now with development of acute kidney injury, raising concerns for early HRS critical care has had discussion with the patient's husband who will come in later this morning.  He is now amenable to DNR and further discussions will be had regarding transition to comfort care.   Principal Problem:   Acute hepatic encephalopathy (HCC) Active Problems:   Thrombocytopenia (HCC)   Hypokalemia   Hypoalbuminemia   Alcoholic cirrhosis (HCC)   ETOH abuse   Abnormal LFTs   Lactic acidosis   Essential hypertension   Hypomagnesemia   Aspiration into airway   Ecchymosis   Hypophosphatemia   Hepatic encephalopathy (  HCC)   Alcohol abuse     LOS: 2 days   Amy EsterwoodPA-C  03/12/2023, 8:49 AM  I have taken an interval history, thoroughly reviewed the chart and examined the patient. I agree with the Advanced Practitioner's note, impression and recommendations, and have recorded additional findings, impressions and recommendations below. I performed a substantive portion of this encounter (>50% time spent), including a complete performance of the medical decision  making.  My additional thoughts are as follows:  Ongoing bleeding, further clinical deterioration, persistent severe thrombocytopenia and poor mental status. I feel the risks of endoscopic intervention are greater than expected benefits, and I agree entirely with considerations of hospice for this patient because her prognosis is grim.  Discontinue octreotide.  Please let us know if we can be of further assistance in this patient's care. Signing off, call as needed  Charlie Pitter III Office:[phone]

## 2023-03-12 NOTE — Progress Notes (Signed)
[facility] PROGRESS NOTE  Joann [name] (DOB: [birth year]) ZOX:[id number]  Brief Narrative: Joann [name] is a 42 y.o. female with a history of end-stage alcoholic cirrhosis s/p TIPS, ongoing alcohol abuse, bleeding esophageal varices, hepatic encephalopathy, pancytopenia who presented to the ED on 03/09/2023 with encephalopathy, agitation. She continued to be agitated requiring IV benzodiazepines, imaging suggestive of LLL infrahilar airspace opacity concerning for aspiration pneumonia, patent TIPS stent, chronic calcific pancreatitis, patchy bilateral renal hypoenhancement suspicious for pyelonephritis. Findings also equivocal for cystitis, descending/sigmoid colitis. Lactic acidosis noted. Broad spectrum antibiotics were administered, CIWA started, electrolytes supplemented, and she was admitted to ICU/SDU with PCCM consultation. NG tube placed, lactulose and rifaximin given. She's had ~200cc bloody output. Gave platelet, FFP, RBC transfusion without improvement. She is showing evidence of continued medical decline and goals of care conversations are ongoing with recommendation from medical teams for transition to hospice care. EGD is deferred based on likelihood of doing more harm than potential good at this time.   Subjective: Ongoing agitation, some tachycardia last night. Labs all look worse despite being addressed. RN reports some ongoing oozing bleeding from nare/NGT.   Objective: BP (!) 85/53   Pulse 91   Temp 97.8 F (36.6 C) (Oral)   Resp 14   Ht 5\' 1"  (1.549 m)   Wt 70.5 kg   SpO2 100%   BMI 29.37 kg/m   Gen: Chronically ill-appearing female in no distress Pulm: Diminished, nonlabored  CV: Regular tachycardia, probe on forehead GI: Soft, +BS, nondistended. Neuro: Drowsy but rousable, not cooperative with exam. Ext: Warm, no deformities, +mittens Skin: Ecchymoses stable.   Assessment & Plan: Acute metabolic encephalopathy, alcohol withdrawal, hepatic encephalopathy: Ceribell  EEG negative. - Continue benzodiazepines, phenobarbital. Continue MVM/thiamine. - Appreciate PCCM assistance with management of hyperactive delirium.   Upper GI bleeding: Evidence of esophagitis on imaging, history of bleeding esophageal varices s/p banding, ongoing alcohol use.  - Hgb recurrently down after transfusion, clinically still some evidence of oozing/bleed. GI rightfully reluctant to subject to invasive procedure in her state pending GOC conversations with spouse.   - Diet per GI - PPI IV BID - Risk > potential benefit of EGD at this time.   Acute blood loss anemia, coagulopathy, pancytopenia/thrombocytopenia: GI source known. ?if ecchymosis/hematoma on right chest wall contributing. - While still not converted to comfort care, will transfuse platelets and RBCs and monitor CBC. - Transfused platelets, FFP, RBC, given vitamin K. Coagulopathy remains, cell lines down further. - Vitamin K  - Keep T&S UTD and monitor H/H for the need to give transfusion. Conservative threshold strategy for now pending GI input.  - Monitor ecchymosis clinically.  Hypernatremia:  - Resistant to hypotonic fluid, will augment rate and change to D5W w/66mEq KCl.   Hypokalemia: Severe, recurrent - Continue aggressive supplementation by IV  Hypomagnesemia:  - Supplemented  Hypophosphatemia:  - Supplement again  Hypotension: Acute on chronic.  - Continue albumin, IVF - Monitor continuously in ICU/SDU with need for sedating medications.  - Abx continued  Blood culture positive for MRSE 1 of 4: Suspected contaminant. No new growth on recheck of 6/30 cultures today. - Given possible infectious niduses and clinical instability, we will continue broad antimicrobial coverage for now.  - Monitor cultures.    Moderate protein calorie malnutrition:  - Optimize nutrition going forward.  End-stage alcoholic cirrhosis: Underlying primary problem exacerbated by ongoing alcohol abuse. Pt is full code, has  been seen by palliative care.  - Appreciate palliative care involvement. We  will discuss again with family our recommendation for hospice care at this time.  Joann Nine, MD [facility] [email or web address] 03/12/2023, 11:02 AM

## 2023-03-13 DIAGNOSIS — F1010 Alcohol abuse, uncomplicated: Secondary | ICD-10-CM

## 2023-03-13 DIAGNOSIS — Z66 Do not resuscitate: Secondary | ICD-10-CM | POA: Diagnosis not present

## 2023-03-13 DIAGNOSIS — K7682 Hepatic encephalopathy: Secondary | ICD-10-CM | POA: Diagnosis not present

## 2023-03-13 DIAGNOSIS — Z515 Encounter for palliative care: Secondary | ICD-10-CM | POA: Diagnosis not present

## 2023-03-13 DIAGNOSIS — R4589 Other symptoms and signs involving emotional state: Secondary | ICD-10-CM

## 2023-03-13 DIAGNOSIS — Z79899 Other long term (current) drug therapy: Secondary | ICD-10-CM

## 2023-03-13 LAB — TYPE AND SCREEN
ABO/RH(D): O POS
Antibody Screen: NEGATIVE
Unit division: 0

## 2023-03-13 LAB — BPAM RBC
Blood Product Expiration Date: 202407252359
ISSUE DATE / TIME: 202407021203
Unit Type and Rh: 5100
Unit Type and Rh: 5100

## 2023-03-13 LAB — CULTURE, BLOOD (ROUTINE X 2): Culture: NO GROWTH

## 2023-03-13 LAB — BPAM PLATELET PHERESIS
Blood Product Expiration Date: 202407042359
ISSUE DATE / TIME: 202407021157
ISSUE DATE / TIME: 202407021714
Unit Type and Rh: 6200
Unit Type and Rh: 7300

## 2023-03-13 LAB — PREPARE FRESH FROZEN PLASMA

## 2023-03-13 LAB — BPAM FFP
Blood Product Expiration Date: 202407062359
ISSUE DATE / TIME: 202407012338
ISSUE DATE / TIME: 202407020113
Unit Type and Rh: 5100
Unit Type and Rh: 5100

## 2023-03-13 LAB — PREPARE PLATELET PHERESIS: Unit division: 0

## 2023-03-13 MED ORDER — SODIUM CHLORIDE 0.9 % IV SOLN
5.0000 mg/h | Freq: Once | INTRAVENOUS | Status: DC
  Filled 2023-03-13: qty 5

## 2023-03-13 MED ORDER — MORPHINE BOLUS VIA INFUSION: 2.0000 mg | INTRAVENOUS | Status: DC | PRN

## 2023-03-13 MED ORDER — HALOPERIDOL LACTATE 5 MG/ML IJ SOLN
2.5000 mg | INTRAMUSCULAR | Status: DC | PRN
  Administered 2023-03-13: 5 mg via INTRAMUSCULAR

## 2023-03-13 MED ORDER — HALOPERIDOL LACTATE 5 MG/ML IJ SOLN
5.0000 mg | Freq: Once | INTRAMUSCULAR | Status: DC
  Filled 2023-03-13: qty 1

## 2023-03-13 MED ORDER — LORAZEPAM 2 MG/ML IJ SOLN
2.0000 mg | Freq: Four times a day (QID) | INTRAMUSCULAR | Status: DC
  Administered 2023-03-13 – 2023-03-14 (×4): 2 mg via INTRAVENOUS
  Filled 2023-03-13 (×4): qty 1

## 2023-03-13 MED ORDER — MORPHINE 100MG IN NS 100ML (1MG/ML) PREMIX INFUSION
5.0000 mg/h | INTRAVENOUS | Status: DC
  Administered 2023-03-13 – 2023-03-15 (×3): 5 mg/h via INTRAVENOUS
  Filled 2023-03-13 (×6): qty 100

## 2023-03-13 NOTE — Progress Notes (Signed)
Chaplain engaged in a follow-up visit with Daurice's husband. Laneshia was resting throughout the visit. Husband was able to engage in narrative life review and grief processing. He shared about how he and Levada met, as well as who Kelinda has been to him. They have been with each other for almost two decades. 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 Husband described Mylin as being fun, kindhearted, loving, and energetic. Altie loved to have a good time with those she loved. Husband also detailed a generational history of addiction on Aloha's father side. Husband voiced that the system failed [place] because she was never approved for rehab. He vocalized that they as a family had to figure out what to do as she battled addiction.   Husband had a number of questions about the practical parts of grief in that he is trying to figure out insurance, Berlinda's car, and what happens financially. Chaplain suggested that Social Work may be a Building control surveyor. Chaplain and husband also talked about allowing himself to be vulnerable enough to grieve. He voiced that people keep telling him to be strong. Chaplain worked to affirm his need to be emotional and express his feelings. Chaplain provided space for him to talk.   Husband was adamant that Savona needs to be in a hospice facility near [place] or [place] because her family is closer to facilities in that direction. Husband is a Naval architect and would not be able to show up for [facility] daily. He desires for her to have constant family support and visits.   Chaplain is available to follow-up for support throughout the day.  03/13/23 1000  Spiritual Encounters  Type of Visit Follow up  Care provided to: Family;Pt not available  Reason for visit Routine spiritual support

## 2023-03-13 NOTE — Progress Notes (Signed)
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 She was noted to have imaging suggestive of a left lower lobe opacity, chronic calcific pancreatitis and patchy bilateral renal enhancement suspicious for pyelonephritis. The patient was started on broad-spectrum antibiotics and a CIWA scale.  An NG tube was placed for lactulose and rifaximin she developed an acute GI bleed after NG tube placement, was given platelet transfusion, vitamin K, PPI and GI was consulted. She continued to be agitated and required mittens. She was found to have electrolyte derangements with hyponatremia, hypokalemia, hypomagnesemia and hypophosphatemia. Agitation was treated with phenobarbital.   7/2 continued to have blood through NG tube and hemoglobin trended down to 6.8.  She continued to be agitated, platelets continue to drop and creatinine increased.  Potassium dropped to 2.7 and sodium remained elevated at 151. She continued to be severely agitated. She was transition to comfort care     Subjective:  Quite agitated today.  Screaming.  Unable to answer questions or give a review of systems.  Assessment and Plan: Principal Problem:    Ongoing ETOH abuse in setting of alcoholic cirrhosis status post TIPS Alcohol withdrawal with probable hepatic encephalopathy as well   Thrombocytopenia (HCC)   Lactic acidosis   Essential hypertension   Hypokalemia   Hypoalbuminemia   Hypomagnesemia   Hypophosphatemia   -Transitioned to comfort care on 03/11/2022 - Appreciate palliative care assistance-morphine infusion being started today - Haldol and Ativan PRN - cont  Phenobarbitol  Active Problems: Upper GI bleed - Possibly related to NG tube insertion - Has resolved  - NG tube removed today          Code Status: DNR Consultants: PCCM, Palliative care Level of Care: Level of care: Palliative Care Total time on patient care: 30 minutes  Objective:   Vitals:   03/12/23 1924 03/12/23 1927 03/12/23 2000 03/13/23 0612  BP: (!) 77/50 114/75  (!) 91/59  Pulse: (!) 118 (!) 117 (!) 116 77  Resp: 20 16 20 16   Temp: 98.2 F (36.8 C)   (!) 97.3 F (36.3 C)  TempSrc: Axillary   Oral  SpO2: 99% 98% 100% 100%  Weight:      Height:       Filed Weights   03/10/23 0800 03/11/23 0500 03/12/23 0500  Weight: 69.7 kg 69.2 kg 70.5 kg   Exam: General exam: Appears uncomfortable -agitated, flailing arms, and intermittently yelling out Respiratory system: Clear to auscultation.  Cardiovascular system: S1 & S2 heard -tachycardic Extremities: No cyanosis, clubbing or edema       CBC: Recent Labs  Lab 03/09/23 2229 03/10/23 0707 03/11/23 0307 03/11/23 1355 03/12/23 0508  WBC 7.3 5.1 3.4*  --  2.8*  NEUTROABS  --  4.1 2.6  --  2.5  HGB 10.4* 8.7* 7.9* 8.5* 6.8*  HCT 33.8* 28.6* 27.1* 28.6* 23.4*  MCV 99.7 99.3 105.0*  --  106.4*  PLT 36* 22* 14*  --  12*   Basic Metabolic Panel: Recent Labs  Lab 03/10/23 0058 03/10/23 0707 03/10/23 1145 03/11/23 0307 03/11/23 0719 03/11/23 1623 03/12/23 0508  NA  --  140  --  151* 150*  151* 151*  K  --  3.4*  --  2.9* 2.8* 3.4* 2.7*  CL  --  106  --  119* 120* 124* 124*  CO2  --  14*  --  20* 19* 18* 18*  GLUCOSE  --  107*  --  105* 87 92 188*  BUN  --  7  --  6 5* 5* 6  CREATININE  --  0.64  --  0.76 0.71 0.66 1.06*  CALCIUM  --  7.1*  --  8.0* 8.0* 8.0* 7.7*  MG 1.2* 1.1*  --  2.5*  --   --  2.0  PHOS  --   --  1.0*  --   --   --  1.7*   GFR: Estimated Creatinine Clearance: 61.5 mL/min (A) (by C-G formula based on SCr of 1.06 mg/dL (H)).  Scheduled Meds:  sodium chloride   Intravenous Once    haloperidol lactate  5 mg Intravenous Once   LORazepam  2 mg Intravenous Q6H   mouth rinse  15 mL Mouth Rinse 4 times per day   PHENObarbital  65 mg Intravenous Q8H   Followed by   Melene Muller ON ] PHENObarbital  32.5 mg Intravenous Q8H   Continuous Infusions:  morphine 5 mg/hr (03/13/23 1604)   Imaging and lab data was personally reviewed DG CHEST PORT 1 VIEW  Result Date: 03/11/2023 CLINICAL DATA:  Hypoxemia EXAM: PORTABLE CHEST 1 VIEW COMPARISON:  CT chest dated 03/10/2023 FINDINGS: Very mild retrocardiac opacity/pneumonia, although better evaluated on CT. No pleural effusion or pneumothorax. The heart is normal in size. Enteric tube courses into the stomach. IMPRESSION: Very mild retrocardiac opacity/pneumonia, although better evaluated on CT. Electronically Signed   By: Charline Bills M.D.   On: 03/11/2023 19:43    LOS: 3 days   Author: Calvert Cantor  03/13/2023 5:05 PM  To contact [facility]>   Check the care team in [facility] and look for the attending/consulting [facility] provider listed  Log into [email or web address] and use [place]'s universal password   Go to> "[facility]"  and find provider  If you still have difficulty reaching the provider, please page the [facility] (Director on Call) for the Hospitalists listed on amion

## 2023-03-13 NOTE — TOC Progression Note (Addendum)
Transition of Care [facility]) - Progression Note    Patient Details  Name: Joann [name] MRN: [id number] Date of Birth: [birth year]  Transition of Care [facility] CM/SW Contact  Beckie Busing, RN Phone [phone]  03/13/2023, 10:45 AM  Clinical Narrative:    [facility] acknowledges consult for residential hospice. CM at bedside with patients husband Harriett Sine who states that patient will need residential hospice in the [place] area. CM has made husband aware that CM will need to touch bases with the care team to make sure this is an appropriate plan. Message has been sent to MD TOC following.  1600 CM has received message confirming expected hospital death and that patient is not appropriate to transfer to inpatient hospice in [place]/ [place] area.        Barriers to Discharge: Other (must enter comment) (residential hospice referral family wants [facility] area)  Expected Discharge Plan and Services                                               Social Determinants of Health (SDOH) Interventions SDOH Screenings   Food Insecurity: No Food Insecurity (01/30/2023)  Housing: Low Risk  (01/30/2023)  Transportation Needs: No Transportation Needs (01/30/2023)  Utilities: Not At Risk (01/30/2023)  Depression (PHQ2-9): Low Risk  (07/12/2022)  Tobacco Use: Low Risk  (01/30/2023)    Readmission Risk Interventions    03/13/2023   10:37 AM 01/22/2023    3:20 PM 08/09/2022    5:07 PM  Readmission Risk Prevention Plan  Transportation Screening Complete Complete Complete  Medication Review (RN Care Manager) Referral to Pharmacy Referral to Pharmacy Complete  PCP or Specialist appointment within 3-5 days of discharge Complete  Complete  HRI or Home Care Consult Complete Complete Complete  SW Recovery Care/Counseling Consult Complete Complete Complete  Palliative Care Screening Complete Complete Not Applicable  Skilled Nursing Facility Not Applicable Not Applicable Complete

## 2023-03-13 NOTE — Progress Notes (Signed)
Daily Progress Note   Patient Name: Joann [name]       Date: 03/13/2023 DOB: 1980-11-28  Age: 42 y.o. MRN#: [id number] Attending Physician: Calvert Cantor, MD Primary Care Physician: Zigmund Daniel., MD Admit Date: 03/09/2023 Length of Stay: 3 days  Reason for Consultation/Follow-up: Establishing goals of care and symptom management  Subjective:   CC: Patient screaming, agitated, and confused when seen today. Following up regarding complex medical decision making and symptom management.   Subjective:  Reviewed EMR prior to presenting to bedside.Patient was transitioned to full comfort focused care yesterday evening once PCCM NP spoke with husband. Patient now out of ICU to focus on comfort care.  At time of EMR review, in the past 24 hours patient had received Haldol 5 mg IV x 2 doses and IV morphine 4 mg x 1 dose. Patient noted to be hypotensive this morning.   When presenting to bedside today. Patient seen laying in bed. Appears agitated, is in mittens, is screaming out, and confused. NG tube still in place. Patient unable to engage in conversation due to medical status. Spoke with patient daughter at bedside and she was able to place patient's husband on speaker phone. Introduced myself as a member of the palliative medicine team.  Discussed patient's current medical care and need to further adjust medications to focus on comfort as patient appears to be actively agitated and in pain. Discussed starting continuous IV infusion for pain and scheduling medication for agitation. Husband and daughter agreeing with this plan.   Husband inquiring about getting patient transfer to inpatient hospice facility in [place]. Expressed worry that based on patient's current medical state, she may not survive transport. Discussed that if the primary goal was to get patient to [facility], we could attempt this though family would need to accept real possibility she could die in transport. After discussing,  family wanting to focus on more aggressive symptom management here and then continue to reevaluate if it appears there would be a time patient would be stable for transfer to [facility]. A All questions answered at that time. Provided emotional support via active listening. Noted palliative medicine team would continue to follow along with patient's medical journey.  Updated IDT after visit and discussed care with RN at bedside.   Objective:   Vital Signs:  BP (!) 91/59 (BP Location: Right Arm)   Pulse 77   Temp (!) 97.3 F (36.3 C) (Oral)   Resp 16   Ht 5\' 1"  (1.549 m)   Wt 70.5 kg   SpO2 100%   BMI 29.37 kg/m   Imaging:  I personally reviewed recent imaging.   Assessment & Plan:   Assessment: Patient is a 42 y.o. female  with past medical history of alcoholic cirrhosis with history of hepatic encephalopathy and bleeding varices, s/p TIPS, anxiety,  admitted on 03/09/2023 with altered mental status. Workup reveals hyperammonemia, aspiration pneumonia, lactic acidosis, ETOH withdrawal. MELD 3.0 on admission calculated to be 24 indicating 87.7% 90 day survival. EEG negative for seizures. Palliative medicine consulted for GOC.    Recommendations/Plan: # Complex medical decision making/goals of care:  - Patient unable to participate in complex medical decision making due to medical status.   - Discussed care with patient's husband and daughter today as described above in HPI. Patient was transitioned to full comfort focused care on 7/2. Discussed continuing aggressive symptom management tat the end of life at this time. Priority is that patient be comfortable and calm.   -  Discussed consideration of transfer to inpatient hospice in [place] though also discussed family would need to accept risk patient could die in transport. At this time, family wants to hold on transfer. If appears there is a time when patient is stable enough, could maybe consider transfer then if family willing to accept  risk.   -  Code Status: DNR  # Symptom management:  # Symptom management    -Pain/Dyspnea, acute in the setting of end-of-life care                Patient was not on medications for pain previously.                               -Start IV morphine continuous basal infusion at 5mg /hr. Start RN bolus via infusion at 2mg  q24mins prn. Continue to adjust based on patient's symptom burden.     -Discontinue NG tube as causing discomfort.                  -Anxiety/agitation, in the setting of end-of-life care                               -Start IV Ativan 2 mg every 6 hours scheduled. Continue to adjust based on patient's symptom burden.                                 -Continue IV Haldol 2.5-5 mg every 4 hours as needed. Continue to adjust based on patient's symptom burden.                     -Secretions, in the setting of end-of-life care                               -Start IV glycopyrrolate 0.2 mg every 4 hours as needed.  # Psychosocial Support:  -husband, daughter  # Discharge Planning: Continue comfort focused care in the hospital. In hospital death anticipated. Should patient stabilize, may consider inpatient hospice evaluation.   Discussed with: bedside RN, hospitalist, patient's husband and daughter   Thank you for allowing the palliative care team to participate in the care Kimberely L Caris.  Alvester Morin, DO Palliative Care Provider PMT # [phone]  If patient remains symptomatic despite maximum doses, please call PMT at [phone] between 0700 and 1900. Outside of these hours, please call attending, as PMT does not have night coverage.  *Please note that this is a verbal dictation therefore any spelling or grammatical errors are due to the "Dragon Medical One" system interpretation.

## 2023-03-14 DIAGNOSIS — Z515 Encounter for palliative care: Secondary | ICD-10-CM | POA: Diagnosis not present

## 2023-03-14 DIAGNOSIS — F1010 Alcohol abuse, uncomplicated: Secondary | ICD-10-CM | POA: Diagnosis not present

## 2023-03-14 DIAGNOSIS — K7030 Alcoholic cirrhosis of liver without ascites: Secondary | ICD-10-CM | POA: Diagnosis not present

## 2023-03-14 DIAGNOSIS — K7682 Hepatic encephalopathy: Secondary | ICD-10-CM | POA: Diagnosis not present

## 2023-03-14 MED ORDER — LORAZEPAM 2 MG/ML IJ SOLN
2.0000 mg | INTRAMUSCULAR | Status: DC | PRN
  Administered 2023-03-15: 2 mg via INTRAVENOUS
  Filled 2023-03-14: qty 1

## 2023-03-14 NOTE — Progress Notes (Signed)
Daily Progress Note   Patient Name: Joann [name]       Date: 03/14/2023 DOB: 02/02/1981  Age: 42 y.o. MRN#: [id number] Attending Physician: Calvert Cantor, MD Primary Care Physician: Zigmund Daniel., MD Admit Date: 03/09/2023 Length of Stay: 4 days  Reason for Consultation/Follow-up: Establishing goals of care and symptom management  Subjective:   CC: Patient comfortable when seen; actively dying. Following up regarding complex medical decision making and symptom management.   Subjective:  Discussed care with bedside RN prior to seeing patient.  Patient has continued to receive continuous IV morphine infusion and IV Ativan for symptom management.  RN able to obtain set of vitals and blood pressure noted to be 59/36.  Presented to bedside to examine patient.  Daughter had stepped out.  Patient appeared comfortable when seen.  Patient appears to be actively dying. Revisited patient 1 daughter at bedside.  Discussed continuation of comfort focused care.  Discussed based on patient's current hypotension, patient will remain in the hospital until she dies as not stable for transfer.  Provided emotional support via active listening.  All questions answered at that time.  Noted palliative medicine team will continue to follow along with patient's journey.  Objective:   Vital Signs:  BP (!) 91/59 (BP Location: Right Arm)   Pulse 77   Temp (!) 97.3 F (36.3 C) (Oral)   Resp 16   Ht 5\' 1"  (1.549 m)   Wt 70.5 kg   SpO2 100%   BMI 29.37 kg/m   Physical Exam: General: Unresponsive Cardiovascular: RRR Respiratory: no increased work of breathing noted, not in respiratory distress Skin: UE b/l cool to touch  Neuro: unresponsive, dying  Imaging:  I personally reviewed recent imaging.   Assessment & Plan:   Assessment: Patient is a 42 y.o. female  with past medical history of alcoholic cirrhosis with history of hepatic encephalopathy and bleeding varices, s/p TIPS, anxiety,   admitted on 03/09/2023 with altered mental status. Workup reveals hyperammonemia, aspiration pneumonia, lactic acidosis, ETOH withdrawal. MELD 3.0 on admission calculated to be 24 indicating 87.7% 90 day survival. EEG negative for seizures. Palliative medicine consulted for GOC.    Recommendations/Plan: # Complex medical decision making/goals of care:  -  Patient was transitioned to full comfort focused care on 7/2. Patient appears to be actively dying and in hospital death is anticipated; not stable for transfer. Discussed care with patient's daughter at beside.   -  Code Status: DNR  # Symptom management:  # Symptom management    -Pain/Dyspnea, acute in the setting of end-of-life care                  -Continue IV morphine continuous basal infusion at 5mg /hr. Continue RN bolus via infusion at 2mg  q60mins prn. Continue to adjust based on patient's symptom burden.                  -Anxiety/agitation, in the setting of end-of-life care                               -Continue IV Ativan 2 mg every 6 hours scheduled. Continue to adjust based on patient's symptom burden.                                 -Continue IV Haldol 2.5-5 mg every 4 hours as needed. Continue  to adjust based on patient's symptom burden.                     -Secretions, in the setting of end-of-life care                               -Continue IV glycopyrrolate 0.2 mg every 4 hours as needed.  # Psychosocial Support:  -husband, daughter  # Discharge Planning: Continue comfort focused care in the hospital. In hospital death anticipated.   Discussed with: bedside RN, hospitalist, patient's daughter   Thank you for allowing the palliative care team to participate in the care Zaydah L Defeo.  Alvester Morin, DO Palliative Care Provider PMT # [phone]  If patient remains symptomatic despite maximum doses, please call PMT at [phone] between 0700 and 1900. Outside of these hours, please call attending, as PMT does not have  night coverage.  *Please note that this is a verbal dictation therefore any spelling or grammatical errors are due to the "Dragon Medical One" system interpretation.

## 2023-03-14 NOTE — Progress Notes (Signed)
Chaplain provided emotional support for family who was at Teola's side--her daughter and parents. Chaplain provided prayer at their request and grief support over several visits.

## 2023-03-14 NOTE — Progress Notes (Signed)
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 She was noted to have imaging suggestive of a left lower lobe opacity, chronic calcific pancreatitis and patchy bilateral renal enhancement suspicious for pyelonephritis. The patient was started on broad-spectrum antibiotics and a CIWA scale.  An NG tube was placed for lactulose and rifaximin she developed an acute GI bleed after NG tube placement, was given platelet transfusion, vitamin K, PPI and GI was consulted. She continued to be agitated and required mittens. She was found to have electrolyte derangements with hyponatremia, hypokalemia, hypomagnesemia and hypophosphatemia. Agitation was treated with phenobarbital.   7/2 continued to have blood through NG tube and hemoglobin trended down to 6.8.  She continued to be agitated, platelets continue to drop and creatinine increased.  Potassium dropped to 2.7 and sodium remained elevated at 151. She continued to be severely agitated. She was transition to comfort care   Subjective:  Resting, not arousable  Assessment and Plan: Principal Problem:    Ongoing ETOH abuse in setting of alcoholic cirrhosis status post TIPS Alcohol withdrawal with probable hepatic encephalopathy as well   Thrombocytopenia (HCC)   Lactic acidosis   Essential hypertension   Hypokalemia   Hypoalbuminemia   Hypomagnesemia   Hypophosphatemia   -Transitioned to comfort care on 03/11/2022 - Appreciate palliative care assistance - Haldol and Ativan PRN - cont Phenobarbitol -morphine infusion started on 7/3- she is resting comfortable. Daughter at bedside.   Active  Problems: Upper GI bleed - Possibly related to NG tube insertion - Has resolved and NG removed          Code Status: DNR Consultants: PCCM, Palliative care Level of Care: Level of care: Palliative Care Total time on patient care: 30 minutes  Objective:   Vitals:   03/12/23 2000 03/13/23 0612 03/14/23 0730 03/14/23 1054  BP:  (!) 91/59  (!) 59/36  Pulse: (!) 116 77 98 98  Resp: 20 16  16   Temp:  (!) 97.3 F (36.3 C)  98 F (36.7 C)  TempSrc:  Oral  Oral  SpO2: 100% 100%  98%  Weight:      Height:       Filed Weights   03/10/23 0800 03/11/23 0500 03/12/23 0500  Weight: 69.7 kg 69.2 kg 70.5 kg   Exam: Sleeping peacefully Resp: CTA CVS: RRR    CBC: Recent Labs  Lab 03/09/23 2229 03/10/23 0707 03/11/23 0307 03/11/23 1355 03/12/23 0508  WBC 7.3 5.1 3.4*  --  2.8*  NEUTROABS  --  4.1 2.6  --  2.5  HGB 10.4* 8.7* 7.9* 8.5* 6.8*  HCT 33.8* 28.6* 27.1* 28.6* 23.4*  MCV 99.7 99.3 105.0*  --  106.4*  PLT 36* 22* 14*  --  12*    Basic Metabolic Panel: Recent Labs  Lab 03/10/23 0058 03/10/23 0707 03/10/23 1145 03/11/23 0307 03/11/23 0719 03/11/23 1623 03/12/23 0508  NA  --  140  --  151* 150* 151* 151*  K  --  3.4*  --  2.9* 2.8* 3.4* 2.7*  CL  --  106  --  119* 120* 124* 124*  CO2  --  14*  --  20* 19* 18* 18*  GLUCOSE  --  107*  --  105* 87 92 188*  BUN  --  7  --  6 5* 5* 6  CREATININE  --  0.64  --  0.76 0.71 0.66 1.06*  CALCIUM  --  7.1*  --  8.0* 8.0* 8.0* 7.7*  MG 1.2* 1.1*  --  2.5*  --   --  2.0  PHOS  --   --  1.0*  --   --   --  1.7*    GFR: Estimated Creatinine Clearance: 61.5 mL/min (A) (by C-G formula based on SCr of 1.06 mg/dL (H)).  Scheduled Meds:  sodium chloride   Intravenous Once   haloperidol lactate  5 mg Intravenous Once   mouth rinse  15 mL Mouth Rinse 4 times per day   PHENObarbital  65 mg Intravenous Q8H   Followed by   Melene Muller ON ] PHENObarbital  32.5 mg Intravenous Q8H   Continuous Infusions:  morphine 5  mg/hr (03/14/23 0834)   Imaging and lab data was personally reviewed No results found.  LOS: 4 days   Author: Calvert Cantor  03/14/2023 3:19 PM  To contact [facility]>   Check the care team in [facility] and look for the attending/consulting [facility] provider listed  Log into [email or web address] and use [place]'s universal password   Go to> "[facility]"  and find provider  If you still have difficulty reaching the provider, please page the [facility] (Director on Call) for the Hospitalists listed on amion

## 2023-03-15 DIAGNOSIS — K7682 Hepatic encephalopathy: Secondary | ICD-10-CM | POA: Diagnosis not present

## 2023-03-15 DIAGNOSIS — Z7189 Other specified counseling: Secondary | ICD-10-CM | POA: Diagnosis not present

## 2023-03-15 DIAGNOSIS — Z515 Encounter for palliative care: Secondary | ICD-10-CM | POA: Diagnosis not present

## 2023-03-15 DIAGNOSIS — F1010 Alcohol abuse, uncomplicated: Secondary | ICD-10-CM | POA: Diagnosis not present

## 2023-03-15 LAB — CULTURE, BLOOD (ROUTINE X 2): Special Requests: ADEQUATE

## 2023-03-15 MED ORDER — GLYCOPYRROLATE 0.2 MG/ML IJ SOLN
0.2000 mg | INTRAMUSCULAR | Status: DC
  Administered 2023-03-15 (×3): 0.2 mg via INTRAVENOUS
  Filled 2023-03-15 (×3): qty 1

## 2023-03-15 MED ORDER — ATROPINE SULFATE 1 % OP SOLN: 1.0000 [drp] | OPHTHALMIC | Status: DC | PRN

## 2023-03-15 MED ORDER — LORAZEPAM 2 MG/ML IJ SOLN
2.0000 mg | INTRAMUSCULAR | Status: DC
  Administered 2023-03-15: 2 mg via INTRAVENOUS
  Filled 2023-03-15: qty 1

## 2023-03-16 NOTE — Progress Notes (Signed)
At 2245, rounded on patient post medication to find that pt was not breathing. Listened for heart tones and respirations for 30 seconds.  Meridith Lineback RN performed second RN verification to confirm time of death at [birth year]16/[birth year].  Daughter and family member at bedside, daughter reached out to call pts husband Harriett Sine.  Pts husband arrived at bedside at 0010.  Funeral home information received and post mortem checklist completed.

## 2023-04-11 NOTE — Progress Notes (Signed)
Provided follow up support for family.  Najmah's husband, as well as her three children were present at the time of the visit.  They are each grieving in their own way and chaplain normalized that family members grieve differently.  Her husband mentioned that his son (Martisha's step-son) is with his partner at the hospital to induce labor.  He shared that Tyniqua very much wanted to meet her grandbaby.  He shared about her pride for her children and how important their education was to her. Family wished to have some family time for now.  Chaplains will follow up at a later time, but please page as needs arise.  [address], Bcc Pager, [phone]

## 2023-04-11 NOTE — Discharge Summary (Signed)
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 She was noted to have imaging suggestive of a left lower lobe opacity, chronic calcific pancreatitis and patchy bilateral renal enhancement suspicious for pyelonephritis. The patient was started on broad-spectrum antibiotics and a CIWA scale.  An NG tube was placed for lactulose and rifaximin she developed an acute GI bleed after NG tube placement, was given platelet transfusion, vitamin K, PPI and GI was consulted. She continued to be agitated and required mittens. She was found to have electrolyte derangements with hyponatremia, hypokalemia, hypomagnesemia and hypophosphatemia. Agitation was treated with phenobarbital.   7/2 continued to have blood through NG tube and hemoglobin trended down to 6.8.  She continued to be agitated, platelets continue to drop and creatinine increased.  Potassium dropped to 2.7 and sodium remained elevated at 151. She continued to be severely agitated. She was transition to comfort care   Assessment and Plan: Principal Problem:    Ongoing ETOH abuse in setting of alcoholic cirrhosis status post TIPS Alcohol withdrawal with probable hepatic encephalopathy as well   Thrombocytopenia (HCC)   Lactic acidosis    Essential hypertension   Hypokalemia   Hypoalbuminemia   Hypomagnesemia   Hypophosphatemia     -Transitioned to comfort care on 03/11/2022 and started on a Morphine infusion - Appreciate palliative care assistance - she expired on 03/19/23 at 2250        Discharge Instructions   Allergies as of 2023/03/19       Reactions   Latex Hives, Other (See Comments)   BURNS THE SKIN   Tape Other (See Comments)   NO TAPES THAT CONTAIN LATEX- burns the skin        Medication List     ASK your doctor about these medications    disulfiram 250 MG tablet Commonly known as: ANTABUSE Take 500 mg by mouth daily.            The results of significant diagnostics from this hospitalization (including imaging, microbiology, ancillary and laboratory) are listed below for reference.    DG CHEST PORT 1 VIEW  Result Date: 03/11/2023 CLINICAL DATA:  Hypoxemia EXAM: PORTABLE CHEST 1 VIEW COMPARISON:  CT chest dated 03/10/2023 FINDINGS: Very mild retrocardiac opacity/pneumonia, although better evaluated on CT. No pleural effusion or pneumothorax. The heart is normal in size. Enteric tube courses into the stomach. IMPRESSION: Very mild retrocardiac opacity/pneumonia, although better evaluated on CT. Electronically Signed   By: Charline Bills M.D.   On: 03/11/2023 19:43   DG Abd 1 View  Result Date: 03/10/2023 CLINICAL DATA:  NG tube placement EXAM: ABDOMEN - 1 VIEW COMPARISON:  Abdominal radiograph August 08, 2022 FINDINGS: NG tube side port and distal tip terminates below the diaphragm, within the stomach. Nonobstructive bowel-gas pattern. Clips and surgical clips seen in the  right upper quadrant. The visualized lungs are clear. IMPRESSION: Adequate positioning of the NG tube, with the side port and distal tip below the diaphragm. Electronically Signed   By: Jacob Moores M.D.   On: 03/10/2023 14:18   Rapid EEG  Result Date: 03/10/2023 Charlsie Quest, MD     03/10/2023  1:12 PM Patient Name:  Joann [name] MRN: [id number] Epilepsy Attending: Charlsie Quest Referring Physician/Provider: Lorin Glass, MD Date: 03/10/2023 Duration: 55.40 mins Patient history: 42yo F with ams getting rapid EEG to evaluate for seizure. Level of alertness:  lethargic AEDs during EEG study: None Technical aspects: This EEG was obtained using a 10 lead EEG system positioned circumferentially without any parasagittal coverage (rapid EEG). Computer selected EEG is reviewed as  well as background features and all clinically significant events. Description: EEG showed continuous generalized 3-5Hz  theta-delta slowing. Hyperventilation and photic stimulation were not performed.   ABNORMALITY - Continuous slow, generalized IMPRESSION: This limited ceribell EEG is study is suggestive of moderate to severe diffuse encephalopathy, nonspecific etiology . No seizures or epileptiform discharges were seen throughout the recording. If suspicion for interictal activity remains a concern, a conventional EEG can be considered. Dr. Katrinka Blazing was notified. Charlsie Quest   CT CHEST ABDOMEN PELVIS W CONTRAST  Result Date: 03/10/2023 CLINICAL DATA:  Altered mentation with suspected sepsis. EXAM: CT CHEST, ABDOMEN, AND PELVIS WITH CONTRAST TECHNIQUE: Multidetector CT imaging of the chest, abdomen and pelvis was performed following the standard protocol during bolus administration of intravenous contrast. RADIATION DOSE REDUCTION: This exam was performed according to the departmental dose-optimization program which includes automated exposure control, adjustment of the mA and/or kV according to patient size and/or use of iterative reconstruction technique. CONTRAST:  OMNIPAQUE IOHEXOL 300 MG/ML  SOLN COMPARISON:  CT abdomen and pelvis with IV contrast 10/31/2022. Portable chest today, portable chest 01/21/2023. No prior chest CT for comparison. FINDINGS: CT CHEST FINDINGS Cardiovascular: No significant vascular findings. Normal heart size. No  pericardial effusion. Mediastinum/Nodes: Small hiatal hernia. 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 There is still mild wall thickening in the distal esophagus but less than previously. Rest of the esophagus is unremarkable. No intrathoracic or axillary adenopathy or thyroid lesion are seen. The trachea and central airways are clear. Lungs/Pleura: There is infrahilar airspace disease in the left lower lobe consistent with pneumonia or aspiration. Rest of the lungs are clear. There is no pleural effusion, thickening or pneumothorax. Mild chronic elevation right diaphragm. Musculoskeletal: No chest wall mass or suspicious bone lesions identified. CT ABDOMEN PELVIS FINDINGS Hepatobiliary: A TIPS stent is again noted from the middle hepatic vein into the main portal vein and is patent with contrast opacification. Calcified granuloma again is noted in the liver dome. Liver cirrhotic and heterogeneous but without a circumscribable mass. There are several scattered tiny hepatic hypodensities which are too small to characterize. The liver is mildly steatotic, with significantly improved steatosis noted since 10/31/2022. In again noted cholecystectomy changes without biliary dilatation. Pancreas: There are coarse parenchymal calcifications in the head and uncinate process of the pancreas consistent with chronic calcific pancreatitis. No acute pancreatic inflammatory changes are seen, no mass enhancement, fluid collections or ductal dilatation. Spleen: Upper limit of normal in size.  No mass enhancement. Adrenals/Urinary Tract: There is no adrenal mass. There is a 2 mm nonobstructive caliceal stone in the superior pole of the right kidney. No nephrolithiasis on  the left. There are stable small cysts in both kidneys. No follow-up imaging is recommended. There is subtle patchy hypoenhancement over portions of the lower poles of both  kidneys. Findings most likely are due to pyelonephritis and not seen previously. There is no hydronephrosis or ureteral stone. Mild bladder thickening is noted versus nondistention. Stomach/Bowel: The gastric wall is contracted. The unopacified small bowel is unremarkable. The appendix is normal in caliber. There is wall thickening versus nondistention in the descending and sigmoid colon. Vascular/Lymphatic: TIPSS stent as above. The aorta is unremarkable. The main portal vein is upper limit of normal in caliber but unchanged. No lymphadenopathy is seen. Reproductive: Uterus and bilateral adnexa are unremarkable. Other: No free fluid, free hemorrhage or free air. Small umbilical fat hernia. Musculoskeletal: No acute or significant osseous findings. IMPRESSION: 1. Left lower lobe infrahilar airspace disease consistent with pneumonia or aspiration. Follow-up study recommended to ensure clearing after treatment. 2. Small hiatal hernia. There is still mild wall thickening in the distal esophagus but less than previously. This could be due to esophagitis or reflux. Endoscopy may be indicated. 3. Cirrhotic liver with mild hepatic steatosis. There are several scattered tiny hypodensities which are too small to characterize. The steatosis has improved since 10/31/2022. 4. Patent TIPS stent. 5. Chronic calcific pancreatitis. No findings of acute pancreatitis. 6. Nonobstructive nephrolithiasis. 7. Patchy hypoenhancement of the lower pole of both kidneys most likely due to pyelonephritis. 8. Cystitis versus bladder nondistention. 9. Descending and sigmoid colitis versus nondistention. 10. Small umbilical fat hernia. Electronically Signed   By: Almira Bar M.D.   On: 03/10/2023 04:39   DG Chest Portable 1 View  Result Date: 03/10/2023 CLINICAL DATA:  Initial evaluation for delirium. EXAM: PORTABLE CHEST 1 VIEW COMPARISON:  Prior study from 01/21/2023. FINDINGS: Cardiomegaly, stable.  Mediastinal silhouette within normal  limits. Mild elevation of the right hemidiaphragm. No focal infiltrates. No edema or effusion. No pneumothorax. Visualized soft tissues and osseous structures demonstrate no acute finding. IMPRESSION: 1. No radiographic evidence for active cardiopulmonary disease. 2. Cardiomegaly. Electronically Signed   By: Rise Mu M.D.   On: 03/10/2023 00:14   CT HEAD WO CONTRAST ( )  Result Date: 03/10/2023 CLINICAL DATA:  Initial evaluation for delirium. EXAM: CT HEAD WITHOUT CONTRAST TECHNIQUE: Contiguous axial images were obtained from the base of the skull through the vertex without intravenous contrast. RADIATION DOSE REDUCTION: This exam was performed according to the departmental dose-optimization program which includes automated exposure control, adjustment of the mA and/or kV according to patient size and/or use of iterative reconstruction technique. COMPARISON:  CT from 10/31/2022. FINDINGS: Brain: Examination limited by poor positioning. No acute intracranial hemorrhage. No acute large vessel territory infarct. No mass lesion or midline shift. No hydrocephalus. No visible extra-axial fluid collection. Vascular: No visible abnormal hyperdense vessel. Skull: Scalp soft tissues demonstrate no acute finding. Calvarium intact. Sinuses/Orbits: Globes and orbital soft tissues grossly within normal limits. Visualized paranasal sinuses and mastoid air cells are clear. Other: None. IMPRESSION: 1. Technically limited exam due to poor positioning. 2. No acute intracranial abnormality identified. Electronically Signed   By: Rise Mu M.D.   On: 03/10/2023 00:12   Labs:   Basic Metabolic Panel: No results for input(s): "NA", "K", "CL", "CO2", "GLUCOSE", "BUN", "CREATININE", "CALCIUM", "MG", "PHOS" in the last 168 hours.   CBC: No results for input(s): "WBC", "NEUTROABS", "HGB", "HCT", "MCV", "PLT" in the last 168 hours.       SIGNED:   Calvert Cantor, MD  [facility] 03/23/2023,  9:07 PM

## 2023-04-11 NOTE — Progress Notes (Signed)
   04/04/2023 1350  Spiritual Encounters  Type of Visit Initial  Care provided to: Family  Referral source Chaplain team  Reason for visit End-of-life  OnCall Visit No   Chaplain visited with family present, daughter and two sons. Each said they were ok at the moment. Shelise was restless but asleep. I was present to hear what they wanted to share and assured them if they wish to speak to a chaplain later in the day to reach out to their nurse. Someone will respond.  In the hall I met Zainab's spouse, Harriett Sine, he expressed sadness and that he was at times trying to hold on. I assured him that is ok and that grieving is hard. A minute at a time is good enough in times like these.  If a chaplain is needed just page [place].  Valerie Roys [facility] [phone]

## 2023-04-11 NOTE — Progress Notes (Signed)
Daily Progress Note   Patient Name: Joann [name]       Date: 03/11/2023 DOB: 07-08-1981  Age: 42 y.o. MRN#: [id number] Attending Physician: Calvert Cantor, MD Primary Care Physician: Zigmund Daniel., MD Admit Date: 03/02/2023 Length of Stay: 5 days  Reason for Consultation/Follow-up: Establishing goals of care and symptom management  Subjective:   CC: Patient unresponsive, actively dying. Following up regarding complex medical decision making and symptom management.   Subjective:  Discussed care with bedside RN prior to seeing patient.   Presented to bedside to check on patient's symptom management at the end of life. Multiple family members present at bedside including patient's husband and daughter. Again introduced myself as a member of the palliative medicine team. Spent time providing emotional support via active listening. Discussed patient is at the end of life and will die in the hospital as is she not hemodynamically stable for transfer. Discussed management of symptoms at the end of life. Patient appears to be having more secretions and restlessness at times so will provide medications for comfort. All questions answered at that time. Noted PMT would continue to follow along with patient's journey.   Updated IDT after visit.   Objective:   Vital Signs:  BP (!) 59/36 (BP Location: Right Arm)   Pulse 98   Temp 98 F (36.7 C) (Oral)   Resp 16   Ht 5\' 1"  (1.549 m)   Wt 70.5 kg   SpO2 98%   BMI 29.37 kg/m   Physical Exam: General: Unresponsive, restless at times, increased secretions heard Cardiovascular: RRR, R radial pulse weak  Respiratory: no increased work of breathing noted, not in respiratory distress Skin: UE b/l cool to touch  Neuro: unresponsive  Imaging:  I personally reviewed recent imaging.   Assessment & Plan:   Assessment: Patient is a 42 y.o. female  with past medical history of alcoholic cirrhosis with history of hepatic encephalopathy and  bleeding varices, s/p TIPS, anxiety,  admitted on 03/10/2023 with altered mental status. Workup reveals hyperammonemia, aspiration pneumonia, lactic acidosis, ETOH withdrawal. MELD 3.0 on admission calculated to be 24 indicating 87.7% 90 day survival. EEG negative for seizures. Palliative medicine consulted for GOC.    Recommendations/Plan: # Complex medical decision making/goals of care:  -  Patient was transitioned to full comfort focused care on 7/2. Patient is actively dying and in hospital death is anticipated; not stable for transfer. Discussed care with patient's  family at bedside.  -  Code Status: DNR  # Symptom management:  # Symptom management    -Pain/Dyspnea, acute in the setting of end-of-life care                  -Continue IV morphine continuous basal infusion at 5mg /hr. Continue RN bolus via infusion at 2mg  q97mins prn. Continue to adjust based on patient's symptom burden.                  -Anxiety/agitation, in the setting of end-of-life care                               -Continue IV Ativan 2 mg every 6 hours scheduled. Continue to adjust based on patient's symptom burden.                                 -Continue IV Haldol 2.5-5 mg every  4 hours as needed. Continue to adjust based on patient's symptom burden.                     -Secretions, in the setting of end-of-life care                               -Schedule IV glycopyrrolate 0.2 mg every 4 hours.  # Psychosocial Support:  -Multiple family members at bedside including husband and daughter   # Discharge Planning: Continue comfort focused care in the hospital. In hospital death anticipated.   Discussed with: bedside RN, hospitalist, patient's  family at bedside  Thank you for allowing the palliative care team to participate in the care [facility].  Alvester Morin, DO Palliative Care Provider PMT # [phone]  If patient remains symptomatic despite maximum doses, please call PMT at [phone] between 0700 and  1900. Outside of these hours, please call attending, as PMT does not have night coverage.  *Please note that this is a verbal dictation therefore any spelling or grammatical errors are due to the "[employer]" system interpretation.

## 2023-04-11 NOTE — Progress Notes (Signed)
    Patient Name: Joann [name]           DOB: 06/13/1981  MRN: [id number]       OVERNIGHT EVENT    Notified by RN that patient has expired at 2250.  2 RN verified. Patient was comfort care.   Family is at bedside.    Anthoney Harada, DNP, ACNPC- AG [facility]

## 2023-04-11 NOTE — Progress Notes (Signed)
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 She was noted to have imaging suggestive of a left lower lobe opacity, chronic calcific pancreatitis and patchy bilateral renal enhancement suspicious for pyelonephritis. The patient was started on broad-spectrum antibiotics and a CIWA scale.  An NG tube was placed for lactulose and rifaximin she developed an acute GI bleed after NG tube placement, was given platelet transfusion, vitamin K, PPI and GI was consulted. She continued to be agitated and required mittens. She was found to have electrolyte derangements with hyponatremia, hypokalemia, hypomagnesemia and hypophosphatemia. Agitation was treated with phenobarbital.   7/2 continued to have blood through NG tube and hemoglobin trended down to 6.8.  She continued to be agitated, platelets continue to drop and creatinine increased.  Potassium dropped to 2.7 and sodium remained elevated at 151. She continued to be severely agitated. She was transition to comfort care  Assessment and Plan: Principal Problem:    Ongoing ETOH abuse in setting of alcoholic cirrhosis status post TIPS Alcohol withdrawal with probable hepatic encephalopathy as well   Thrombocytopenia (HCC)   Lactic acidosis   Essential hypertension   Hypokalemia   Hypoalbuminemia   Hypomagnesemia   Hypophosphatemia   -Transitioned to comfort care on 03/11/2022 - Appreciate palliative care assistance -The patient is comfortable with current medication regimen which includes a morphine infusion with as needed IV morphine, as needed Ativan and Haldol -we are expecting an in-hospital  death  Active Problems: Upper GI bleed - Possibly related to NG tube insertion - Has resolved and NG removed          Code Status: DNR Consultants: PCCM, Palliative care Level of Care: Level of care: Palliative Care Total time on patient care: 30 minutes  Objective:   Vitals:   03/13/23 0612 03/14/23 0730 03/14/23 1054 04/09/2023 0844  BP: (!) 91/59  (!) 59/36 (!) 61/36  Pulse: 77 98 98 (!) 114  Resp: 16  16 10   Temp: (!) 97.3 F (36.3 C)  98 F (36.7 C) 98 F (36.7 C)  TempSrc: Oral  Oral Oral  SpO2: 100%  98% 97%  Weight:      Height:       Filed Weights   03/10/23 0800 03/11/23 0500 04/06/2023 0500  Weight: 69.7 kg 69.2 kg 70.5 kg   Exam: Sleeping peacefully    CBC: Recent Labs  Lab 02/18/2023 2229 03/10/23 0707 03/11/23 0307 03/11/23 1355 03/18/2023 0508  WBC 7.3 5.1 3.4*  --  2.8*  NEUTROABS  --  4.1 2.6  --  2.5  HGB 10.4* 8.7* 7.9* 8.5* 6.8*  HCT 33.8* 28.6* 27.1* 28.6* 23.4*  MCV 99.7 99.3 105.0*  --  106.4*  PLT 36* 22* 14*  --  12*    Basic Metabolic Panel: Recent Labs  Lab 03/10/23 0058 03/10/23 0707 03/10/23 1145 03/11/23 0307 03/11/23 0719 03/11/23 1623 03/28/2023 0508  NA  --  140  --  151* 150* 151* 151*  K  --  3.4*  --  2.9* 2.8* 3.4* 2.7*  CL  --  106  --  119* 120* 124* 124*  CO2  --  14*  --  20* 19* 18* 18*  GLUCOSE  --  107*  --  105* 87 92 188*  BUN  --  7  --  6 5* 5* 6  CREATININE  --  0.64  --  0.76 0.71 0.66 1.06*  CALCIUM  --  7.1*  --  8.0* 8.0* 8.0* 7.7*  MG 1.2* 1.1*  --  2.5*  --   --  2.0  PHOS  --   --  1.0*  --   --   --  1.7*    GFR: Estimated Creatinine Clearance: 61.5 mL/min (A) (by C-G formula based on SCr of 1.06 mg/dL (H)).  Scheduled Meds:  sodium chloride   Intravenous Once   glycopyrrolate  0.2 mg Intravenous Q4H   haloperidol lactate  5 mg Intravenous Once   PHENObarbital  65 mg Intravenous Q8H   Followed by   PHENObarbital  32.5 mg Intravenous Q8H   Continuous Infusions:  morphine 5 mg/hr  (04/08/2023 0147)   Imaging and lab data was personally reviewed No results found.  LOS: 5 days   Author: Calvert Cantor  03/28/2023 2:28 PM  To contact [facility]>   Check the care team in [facility] and look for the attending/consulting [facility] provider listed  Log into [email or web address] and use [facility] universal password   Go to> "[facility]"  and find provider  If you still have difficulty reaching the provider, please page the [facility] (Director on Call) for the Hospitalists listed on amion

## 2023-04-11 DEATH — deceased

## 2024-05-12 IMAGING — DX DG CHEST 1V PORT
1 series · 1 of 1 positions shown · non-contrast
Comparison: 02/17/2022

CLINICAL DATA: Aspiration pneumonitis, hypertension

EXAM:
PORTABLE CHEST 1 VIEW

[chest ap]
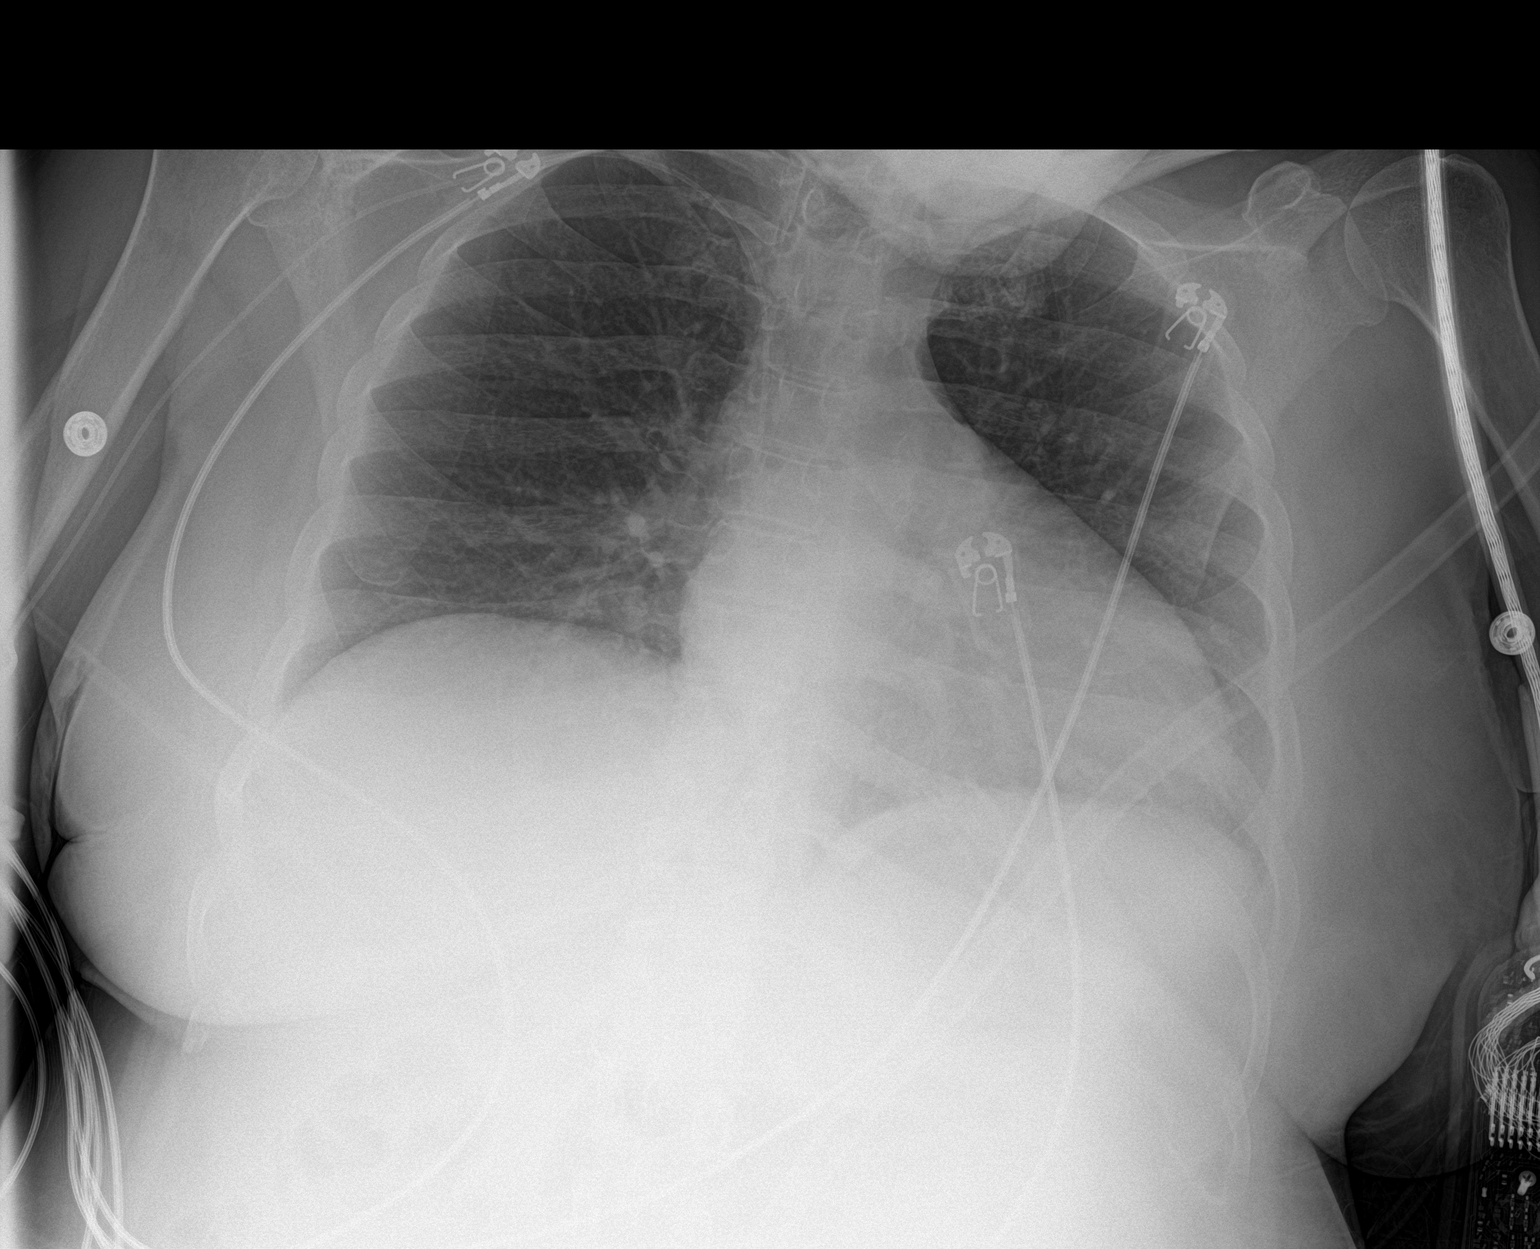

[1 of 1 positions shown; findings below may reference images not displayed]

FINDINGS: The heart size and mediastinal contours are within normal limits.
Both lungs are clear. The visualized skeletal structures are
unremarkable.
IMPRESSION: No active disease.
